# Patient Record
Sex: Female | Born: 1960 | Race: Black or African American | Hispanic: No | Marital: Married | Smoking: Former smoker
Health system: Southern US, Community
[De-identification: ages and names within clinical notes are randomized; demographics above are authoritative.]

## PROBLEM LIST (undated history)

## (undated) DIAGNOSIS — Z86018 Personal history of other benign neoplasm: Secondary | ICD-10-CM

## (undated) DIAGNOSIS — Z860101 Personal history of adenomatous and serrated colon polyps: Secondary | ICD-10-CM

## (undated) DIAGNOSIS — K219 Gastro-esophageal reflux disease without esophagitis: Secondary | ICD-10-CM

## (undated) DIAGNOSIS — D649 Anemia, unspecified: Secondary | ICD-10-CM

## (undated) DIAGNOSIS — J449 Chronic obstructive pulmonary disease, unspecified: Secondary | ICD-10-CM

## (undated) DIAGNOSIS — Z8759 Personal history of other complications of pregnancy, childbirth and the puerperium: Secondary | ICD-10-CM

## (undated) DIAGNOSIS — L309 Dermatitis, unspecified: Secondary | ICD-10-CM

## (undated) DIAGNOSIS — R3915 Urgency of urination: Secondary | ICD-10-CM

## (undated) DIAGNOSIS — Z905 Acquired absence of kidney: Secondary | ICD-10-CM

## (undated) DIAGNOSIS — N200 Calculus of kidney: Secondary | ICD-10-CM

## (undated) DIAGNOSIS — Z87442 Personal history of urinary calculi: Secondary | ICD-10-CM

## (undated) DIAGNOSIS — Z8619 Personal history of other infectious and parasitic diseases: Secondary | ICD-10-CM

## (undated) DIAGNOSIS — N201 Calculus of ureter: Secondary | ICD-10-CM

## (undated) DIAGNOSIS — I509 Heart failure, unspecified: Secondary | ICD-10-CM

## (undated) DIAGNOSIS — Z86010 Personal history of colonic polyps: Secondary | ICD-10-CM

## (undated) DIAGNOSIS — I10 Essential (primary) hypertension: Secondary | ICD-10-CM

## (undated) DIAGNOSIS — E785 Hyperlipidemia, unspecified: Secondary | ICD-10-CM

## (undated) DIAGNOSIS — Z8701 Personal history of pneumonia (recurrent): Secondary | ICD-10-CM

## (undated) DIAGNOSIS — M1990 Unspecified osteoarthritis, unspecified site: Secondary | ICD-10-CM

## (undated) DIAGNOSIS — I219 Acute myocardial infarction, unspecified: Secondary | ICD-10-CM

## (undated) DIAGNOSIS — E119 Type 2 diabetes mellitus without complications: Secondary | ICD-10-CM

## (undated) DIAGNOSIS — Z8742 Personal history of other diseases of the female genital tract: Secondary | ICD-10-CM

## (undated) DIAGNOSIS — Z8719 Personal history of other diseases of the digestive system: Secondary | ICD-10-CM

## (undated) HISTORY — PX: UMBILICAL HERNIA REPAIR: SHX196

## (undated) HISTORY — DX: Essential (primary) hypertension: I10

## (undated) HISTORY — DX: Type 2 diabetes mellitus without complications: E11.9

## (undated) HISTORY — DX: Unspecified osteoarthritis, unspecified site: M19.90

## (undated) HISTORY — DX: Heart failure, unspecified: I50.9

## (undated) HISTORY — DX: Hyperlipidemia, unspecified: E78.5

## (undated) HISTORY — DX: Acute myocardial infarction, unspecified: I21.9

## (undated) HISTORY — DX: Chronic obstructive pulmonary disease, unspecified: J44.9

---

## 1986-07-02 DIAGNOSIS — Z8759 Personal history of other complications of pregnancy, childbirth and the puerperium: Secondary | ICD-10-CM

## 1986-07-02 HISTORY — PX: ECTOPIC PREGNANCY SURGERY: SHX613

## 1986-07-02 HISTORY — DX: Personal history of other complications of pregnancy, childbirth and the puerperium: Z87.59

## 1992-07-02 HISTORY — PX: CHOLECYSTECTOMY: SHX55

## 1998-02-14 ENCOUNTER — Emergency Department (HOSPITAL_COMMUNITY): Admission: EM | Admit: 1998-02-14 | Discharge: 1998-02-14 | Payer: Self-pay | Admitting: Emergency Medicine

## 1998-05-30 ENCOUNTER — Encounter: Payer: Self-pay | Admitting: Orthopedic Surgery

## 1998-05-30 ENCOUNTER — Observation Stay (HOSPITAL_COMMUNITY): Admission: RE | Admit: 1998-05-30 | Discharge: 1998-05-31 | Payer: Self-pay | Admitting: Orthopedic Surgery

## 1998-11-12 ENCOUNTER — Emergency Department (HOSPITAL_COMMUNITY): Admission: EM | Admit: 1998-11-12 | Discharge: 1998-11-12 | Payer: Self-pay | Admitting: Emergency Medicine

## 1999-01-02 ENCOUNTER — Ambulatory Visit (HOSPITAL_COMMUNITY): Admission: RE | Admit: 1999-01-02 | Discharge: 1999-01-02 | Payer: Self-pay | Admitting: *Deleted

## 1999-01-02 ENCOUNTER — Encounter: Payer: Self-pay | Admitting: *Deleted

## 1999-01-31 HISTORY — PX: LUMBAR DISC SURGERY: SHX700

## 1999-02-07 ENCOUNTER — Encounter: Payer: Self-pay | Admitting: Neurosurgery

## 1999-02-07 ENCOUNTER — Observation Stay (HOSPITAL_COMMUNITY): Admission: RE | Admit: 1999-02-07 | Discharge: 1999-02-07 | Payer: Self-pay | Admitting: Neurosurgery

## 1999-05-15 ENCOUNTER — Encounter: Payer: Self-pay | Admitting: Obstetrics and Gynecology

## 1999-05-15 ENCOUNTER — Ambulatory Visit (HOSPITAL_COMMUNITY): Admission: RE | Admit: 1999-05-15 | Discharge: 1999-05-15 | Payer: Self-pay | Admitting: Obstetrics and Gynecology

## 1999-06-08 ENCOUNTER — Encounter: Payer: Self-pay | Admitting: Obstetrics and Gynecology

## 1999-06-08 ENCOUNTER — Ambulatory Visit (HOSPITAL_COMMUNITY): Admission: RE | Admit: 1999-06-08 | Discharge: 1999-06-08 | Payer: Self-pay | Admitting: Obstetrics and Gynecology

## 1999-06-30 ENCOUNTER — Observation Stay (HOSPITAL_COMMUNITY): Admission: AD | Admit: 1999-06-30 | Discharge: 1999-07-02 | Payer: Self-pay | Admitting: Obstetrics and Gynecology

## 1999-07-03 ENCOUNTER — Inpatient Hospital Stay (HOSPITAL_COMMUNITY): Admission: AD | Admit: 1999-07-03 | Discharge: 1999-07-03 | Payer: Self-pay | Admitting: Obstetrics & Gynecology

## 1999-08-08 ENCOUNTER — Encounter: Payer: Self-pay | Admitting: Obstetrics and Gynecology

## 1999-08-08 ENCOUNTER — Inpatient Hospital Stay (HOSPITAL_COMMUNITY): Admission: AD | Admit: 1999-08-08 | Discharge: 1999-08-10 | Payer: Self-pay | Admitting: Obstetrics and Gynecology

## 1999-10-16 ENCOUNTER — Inpatient Hospital Stay (HOSPITAL_COMMUNITY): Admission: AD | Admit: 1999-10-16 | Discharge: 1999-10-18 | Payer: Self-pay | Admitting: Obstetrics & Gynecology

## 1999-10-16 ENCOUNTER — Encounter (INDEPENDENT_AMBULATORY_CARE_PROVIDER_SITE_OTHER): Payer: Self-pay | Admitting: Specialist

## 1999-10-17 HISTORY — PX: TUBAL LIGATION: SHX77

## 2001-10-12 ENCOUNTER — Emergency Department (HOSPITAL_COMMUNITY): Admission: EM | Admit: 2001-10-12 | Discharge: 2001-10-12 | Payer: Self-pay | Admitting: Emergency Medicine

## 2001-10-13 ENCOUNTER — Inpatient Hospital Stay (HOSPITAL_COMMUNITY): Admission: EM | Admit: 2001-10-13 | Discharge: 2001-10-15 | Payer: Self-pay | Admitting: Neurosurgery

## 2001-10-13 ENCOUNTER — Encounter: Payer: Self-pay | Admitting: Emergency Medicine

## 2001-10-14 ENCOUNTER — Encounter: Payer: Self-pay | Admitting: Neurosurgery

## 2001-10-14 HISTORY — PX: OTHER SURGICAL HISTORY: SHX169

## 2003-03-17 ENCOUNTER — Encounter: Admission: RE | Admit: 2003-03-17 | Discharge: 2003-03-17 | Payer: Self-pay | Admitting: Family Medicine

## 2003-03-17 ENCOUNTER — Encounter: Payer: Self-pay | Admitting: Family Medicine

## 2004-11-24 ENCOUNTER — Emergency Department (HOSPITAL_COMMUNITY): Admission: EM | Admit: 2004-11-24 | Discharge: 2004-11-24 | Payer: Self-pay | Admitting: Emergency Medicine

## 2006-08-06 ENCOUNTER — Emergency Department (HOSPITAL_COMMUNITY): Admission: EM | Admit: 2006-08-06 | Discharge: 2006-08-06 | Payer: Self-pay | Admitting: Emergency Medicine

## 2006-11-18 ENCOUNTER — Other Ambulatory Visit: Admission: RE | Admit: 2006-11-18 | Discharge: 2006-11-18 | Payer: Self-pay | Admitting: Family Medicine

## 2007-01-20 ENCOUNTER — Encounter (INDEPENDENT_AMBULATORY_CARE_PROVIDER_SITE_OTHER): Payer: Self-pay | Admitting: Obstetrics and Gynecology

## 2007-01-20 ENCOUNTER — Inpatient Hospital Stay (HOSPITAL_COMMUNITY): Admission: RE | Admit: 2007-01-20 | Discharge: 2007-01-22 | Payer: Self-pay | Admitting: Obstetrics and Gynecology

## 2007-01-20 HISTORY — PX: ABDOMINAL HYSTERECTOMY: SHX81

## 2007-05-06 ENCOUNTER — Emergency Department (HOSPITAL_COMMUNITY): Admission: EM | Admit: 2007-05-06 | Discharge: 2007-05-06 | Payer: Self-pay | Admitting: Emergency Medicine

## 2007-12-03 ENCOUNTER — Encounter: Admission: RE | Admit: 2007-12-03 | Discharge: 2007-12-03 | Payer: Self-pay | Admitting: Family Medicine

## 2008-11-26 ENCOUNTER — Emergency Department (HOSPITAL_COMMUNITY): Admission: EM | Admit: 2008-11-26 | Discharge: 2008-11-26 | Payer: Self-pay | Admitting: Emergency Medicine

## 2010-07-22 ENCOUNTER — Encounter: Payer: Self-pay | Admitting: Family Medicine

## 2010-10-24 ENCOUNTER — Other Ambulatory Visit: Payer: Self-pay | Admitting: Family Medicine

## 2010-10-24 ENCOUNTER — Other Ambulatory Visit (HOSPITAL_COMMUNITY)
Admission: RE | Admit: 2010-10-24 | Discharge: 2010-10-24 | Disposition: A | Discharge status: Home / Self Care | Payer: PRIVATE HEALTH INSURANCE | Source: Ambulatory Visit | Attending: Family Medicine | Admitting: Family Medicine

## 2010-10-24 DIAGNOSIS — Z124 Encounter for screening for malignant neoplasm of cervix: Secondary | ICD-10-CM | POA: Insufficient documentation

## 2010-11-08 ENCOUNTER — Other Ambulatory Visit: Payer: Self-pay | Admitting: Family Medicine

## 2010-11-08 DIAGNOSIS — Z1231 Encounter for screening mammogram for malignant neoplasm of breast: Secondary | ICD-10-CM

## 2010-11-14 NOTE — H&P (Signed)
Lynn Estrada, Lynn Estrada             ACCOUNT NO.:  [id number]   MEDICAL RECORD NO.:  [id number]          PATIENT TYPE:  AMB   LOCATION:  SDC                           FACILITY:  [facility]   PHYSICIAN:  Gerald Leitz, MD          DATE OF BIRTH:  09-08-1960   DATE OF ADMISSION:  DATE OF DISCHARGE:                              HISTORY & PHYSICAL   The patient is scheduled for surgery on January 20, 2007.   HISTORY OF PRESENT ILLNESS:  This is a 50 year old, G2, P1-0-1-1, with  symptomatic uterine fibroids and menorrhagia who desires definitive  therapy via vaginal hysterectomy.  She has been anemic as a result of  menorrhagia with a hemoglobin in May of 8.1.   PAST OBSTETRICAL HISTORY:  1. Spontaneous vaginal delivery x1.  2. SAB x1.   GYNECOLOGICAL HISTORY:  No history of abnormal Pap smears.  Last Pap  smear, Nov 18, 2006, normal.  No history of sexually transmitted  diseases.   PAST MEDICAL HISTORY:  Anemia.   PAST SURGICAL HISTORY:  1. Partial right nephrectomy in 2000.  2. Umbilical hernia repair.   MEDICATIONS:  Amoxicillin.   ALLERGIES:  LATEX.   SOCIAL HISTORY:  The patient is married.  She denies tobacco use.  Occasional alcohol use.  No illicit drug use.   FAMILY HISTORY:  Maternal aunt with breast cancer.  No history of  ovarian or colon cancer.   REVIEW OF SYSTEMS:  Negative except as stated in history of current  illness.   PHYSICAL EXAMINATION:  VITAL SIGNS:  Blood pressure 132/82, weight 194  pounds.  CARDIOVASCULAR:  Regular rate and rhythm.  LUNGS:  Clear to auscultation bilaterally.  ABDOMEN:  Soft, nontender, nondistended.  Positive bowel sounds.  EXTREMITIES:  No clubbing, cyanosis, or edema.  PELVIC:  Normal external female genitalia.  No vulvar, vaginal, or  cervical lesions noted.  Bimanual exam reveals an approximately 12-week  size uterus.  No adnexal masses or tenderness.   Ultrasound on December 25, 2006, shows the uterus measured 10.2-cm in  length, AP  diameter of 7.9-cm, width 8.6-cm.  Endometrial biopsy  performed on December 25, 2006, showed proliferative type endometrium, no  hyperplasia or malignancy.   ASSESSMENT:  1. Symptomatic uterine fibroids.  2. Anemia.  3. Failed hormonal management.   PLAN:  The patient desires definitive therapy via vaginal hysterectomy,  possible bilateral salpingo-oophorectomy.  Risks, benefits, alternatives  of surgery were discussed with the patient including, but not limited to  infection; bleeding; damage to bowel, bladder, surrounding organs with  the need for further surgery; risk of transfusion; HIV; hepatitis B and  C were discussed.  The patient understands all risks and desires to  proceed with a vaginal hysterectomy.      Gerald Leitz, MD  Electronically Signed     TC/MEDQ  D:  01/19/2007  T:  01/19/2007  Job:  [id number]

## 2010-11-14 NOTE — Op Note (Signed)
Lynn Estrada, Lynn Estrada             ACCOUNT NO.:  [id number]   MEDICAL RECORD NO.:  [id number]          PATIENT TYPE:  INP   LOCATION:  9306                          [facility]ACILITY:  [facility]   PHYSICIAN:  Gerald Leitz, MD          DATE OF BIRTH:  [birth year]   DATE OF PROCEDURE:  01/20/2007  DATE OF DISCHARGE:                               OPERATIVE REPORT   PREOPERATIVE DIAGNOSES:  1. Symptomatic uterine fibroids.  2. Menorrhagia.  3. Anemia left ovarian cyst.  4. Right gluteal mass.   POSTOPERATIVE DIAGNOSES:  1. Symptomatic uterine fibroids.  2. Menorrhagia.  3. Anemia left ovarian cyst.  4. Right gluteal mass.  5. Suspected left lymphocyst.   PROCEDURE:  Vaginal hysterectomy with conversion to abdominal  hysterectomy, left salpingo-oophorectomy, with lysis of adhesions and  removal of right gluteal cyst.   SURGEON:  Gerald Leitz, MD   ASSISTANT:  Bing Neighbors. Delcambre, MD   ANESTHESIA:  General.   SPECIMENS:  Uterus, left fallopian tube and ovary, and right gluteal  mass.   DISPOSITION:  Specimen sent to pathology.   ESTIMATED BLOOD LOSS:  150 mL.   FLUIDS:  3100 mL lactated Ringer's.   URINE OUTPUT:  150 mL.   FINDINGS:  Approximately a 14-week fibroid uterus with multiple  leiomyomata.  There was a complex cyst on the left ovary.  The patient  also had adhesions of the colon to the fundus of the uterus.  The right  ovary and fallopian tube appeared normal.   COMPLICATIONS:  None.   PROCEDURE:  The patient was taken to the operating room, where she was  placed in the dorsal lithotomy position.  She was prepped and draped in  the usual sterile fashion.  A weighted speculum was placed into the  vaginal vault.  The cervix was injected circumferentially with 1%  lidocaine with epinephrine approximately 10 mL.  The cervix was incised  circumferentially with a scalpel.  The bladder flap reflection was  identified and entered sharply with Metzenbaum scissors.  Attention was  turned to the posterior aspect of the cervix.  This was incised and the  peritoneum was identified and entered sharply.  A small weighted  speculum was placed.  The uterosacral ligaments were clamped,  transected, and suture ligated with 0 Vicryl.  This was repeated on the  left uterosacral.  The cardinal ligaments were clamped with Heaney  clamps, transected, and suture ligated with 0 Vicryl.  The patient was  noted to have adhesions of the colon to the anterior aspect of the  fundus due to the dense adhesions.  A decision was made to go anteriorly  to complete the surgery to avoid bowel injury.  Surgeons' gloves and  gowns were changed.  Attention was then turned to the abdomen, which was  reprepped.  A Pfannenstiel skin incision was made with a scalpel,  carried down to the underlying layer of fascia.  The fascia was incised  in the midline.  The incision was extended laterally with Mayo scissors.  The superior aspect of the fascial incision was grasped with  Kocher  clamps, elevated, and underlying rectus muscles were dissected off with  Mayo scissors.  This was repeated on the inferior aspect of the fascial  incision.  The peritoneum was identified, entered sharply with  Metzenbaum scissors.  The abdomen was examined with the findings as  noted above.  Balfour retractor was placed into the abdomen and the  bowel was packed away with moist laparotomy sponges.  The adhesions of  the colon to the fundus of the uterus were excised with Metzenbaum  scissors.  The round ligament on the right was suture ligated with 0  Vicryl.  It was then transected and the bladder reflection was created  toward the midline bilaterally.  The utero-ovarian ligament on the right  was doubly clamped, transected, and suture ligated with 0 Vicryl.  The  infundibulopelvic ligament on the left was clamped with Heaney clamp,  transected and suture ligated with 0 Vicryl.  At this point the uterus,  cervix and left  tube and ovary were completely excised and sent to  pathology.  An angle suture was placed in the vaginal cuff bilaterally  with 0Vicryl and a running stitch of zero Vicryl was used to close the  vaginal cuff.  The abdomen was copiously irrigated with warm normal  saline.  Everything was found to be hemostatic.  Attention was turned to  a mass in the left abdominal sidewall that appeared to be  retroperitoneal.  The retroperitoneum was opened with Metzenbaum  scissors.  This appeared to be a benign lymphocyst approximately 3 cm in  size.  All instruments and sponges were removed from the abdomen.  The  fascia was closed with 0 PDS.  The skin was closed with staples.  Sponge, lap and needle counts were correct x2.  Ancef 2 g was given  prior to the procedure.  The patient was awakened from anesthesia and  taken to the recovery room awake and in stable condition.      Gerald Leitz, MD  Electronically Signed     TC/MEDQ  D:  01/20/2007  T:  01/20/2007  Job:  [id number]

## 2010-11-14 NOTE — Op Note (Signed)
NAME:  Lynn Estrada, Lynn Estrada             ACCOUNT NO.:  [id number]   MEDICAL RECORD NO.:  [id number]          PATIENT TYPE:  INP   LOCATION:  9306                          [facility]ILITY[facility]  [facility]   PHYSICIAN:  Gerald Leitz, MD          DATE OF BIRTH:  Mar 13, 1961   DATE OF PROCEDURE:  DATE OF DISCHARGE:                               OPERATIVE REPORT   ADDENDUM:  At the end of the report, please write:  Once abdominal hysterectomy was  complete and skin was closed with staples, attention was turned to the  right buttock, which was prepped and draped in a sterile fashion.  Approximately 10 mL of lidocaine with epinephrine were injected around  an approximately 2-cm gluteal mass.  A 2-cm skin incision was made with  a scalpel.  The gluteal mass, which appeared to be a lipoma, was excised  using sharp dissection with Metzenbaum scissors.  Gluteal tissue was  reapproximated with 2-0 Vicryl.  The skin was closed with 3-0 Vicryl.  Excellent hemostasis was noted.  The patient was then awakened from  anesthesia and taken to the recovery room awake and in stable condition.      Gerald Leitz, MD  Electronically Signed     TC/MEDQ  D:  01/20/2007  T:  01/20/2007  Job:  [id number]

## 2010-11-15 ENCOUNTER — Ambulatory Visit
Admission: RE | Admit: 2010-11-15 | Discharge: 2010-11-15 | Disposition: A | Discharge status: Home / Self Care | Payer: PRIVATE HEALTH INSURANCE | Source: Ambulatory Visit | Attending: Family Medicine | Admitting: Family Medicine

## 2010-11-15 DIAGNOSIS — Z1231 Encounter for screening mammogram for malignant neoplasm of breast: Secondary | ICD-10-CM

## 2010-11-17 ENCOUNTER — Other Ambulatory Visit: Payer: Self-pay | Admitting: Family Medicine

## 2010-11-17 DIAGNOSIS — R928 Other abnormal and inconclusive findings on diagnostic imaging of breast: Secondary | ICD-10-CM

## 2010-11-17 NOTE — Op Note (Signed)
[facility]  Patient:    Lynn Estrada, Lynn Estrada Visit Number: [id number] MRN: [id number]          Service Type: EMS Location: ED Attending Physician:  Shelba Flake Dictated by:   Julio Sicks, M.D. Proc. 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  PROCEDURE:  Reexploration of right L5-S1 laminotomy with microdiskectomy.  SURGEON:  Julio Sicks, M.D.  ASSISTANT:  Donzetta Sprung. Roney Jaffe., M.D.  INDICATIONS:  [name] is a 50 year old female who is status post a previous right-sided L5-S1 laminotomy and microdiskectomy approximately four years ago. The patient did well postoperatively.  Approximately two weeks ago, the patient began having the abrupt onset of severe right lower extremity pain. These symptoms progressively worsened and failed conservative management including oral steroids.  The patient has reported to the emergency room on two different occasions with severe intractable pain.  Yesterday, the patients pain was unable to be managed by parenteral pain medication and decision was made to proceed urgently with an MRI scan of her lumbar spine to better evaluate what is going on structurally.  MRI scanning of her lumbar spine demonstrated a very large right-sided L5-S1 recurrent disk herniation with compression of the right-sided S1 nerve root.  I counseled the patient as to her options.  I decided to proceed with a right-sided L5-S1 laminotomy and microdiskectomy in hopes of relieving her symptoms.  The patient is aware of the risks and benefits including but not limited to risks of anesthesia, bleeding, infection, CSF leak, nerve root  injury, disk herniation.  The patient has been given the opportunity to ask questions and appears to understand.  She wishes to proceed with surgery.  DESCRIPTION OF PROCEDURE:  The patient was taken to the operating room and placed on the table in supine position.  Under adequate level of anesthesia, the patient was positioned prone on Wilson frame and appropriately padded. The patients lumbar region was prepped and draped sterilely.  A linear skin incision was made overlying the L5-S1 interspace.  This was carried down sharply in the midline.  A subperiosteal dissection was performed on the right which was the lamina of the facet joints of L5 and S1.  Deep retaining retractor was placed.  The previous laminotomy site was then dissected free using dental instruments and Kerrison rongeurs.  The laminotomy was slightly widened using Kerrison rongeurs.  The underlying thecal sac and disk herniation were apparent.  The microscope was brought into the field to do microdissection of the right-sided S1 nerve root and disk herniation.  Fibrous tissue overlying the disk herniation was dissected free using microinstruments. A very large free fragment of disk rupture was then encountered and resected using blunt nerve hooks and pituitary rongeurs.  The pedicle of S1 was identified as was the right-sided S1 nerve root.  All elements of the disk fragment were completely removed.  Epidural scar was removed.  The thecal sac and S1 nerve root were then mobilized and retracted toward the midline.  The disk space was isolated and incised with 15 blade in rectangular fashion.  A wide disk space clamp was then achieved  using pituitary rongeurs and Epstein curets.  All loose disk material was removed from the interspace.  All elements of the disk herniation had been completely removed.  There was no evidence of injury to thecal sac or nerve roots.  The wound was then copiously irrigated with antibiotic  solution.  Gelfoam was placed postoperatively for hemostasis and found to be good.  The microscope and retractors were removed.  Hemostasis in the muscle was achieved with electrocautery.  The wound was then closed in layers with Vicryl sutures. Steri-Strips and sterile dressing were applied.  There were no apparent complications.  The patient tolerated the procedure well and she returns to the recovery room postoperatively. Dictated by:   Julio Sicks, M.D. Attending Physician:  Shelba Flake DD:  10/14/01 TD:  10/14/01 Job: [id number]

## 2010-11-17 NOTE — Discharge Summary (Signed)
[facility]  Patient:    Lynn Estrada, Lynn Estrada                    MRN: [id number] Adm. Date:  [id number] Disc. Date: [id number] Attending:  Shaune Spittle Dictator:   Miguel Dibble, C.N.M.                           Discharge Summary  ADMISSION DIAGNOSES:          1. Intrauterine pregnancy at 28-3/7 weeks.                               2. Pyelonephritis.  DISCHARGE DIAGNOSES:          1. Intrauterine pregnancy at 28-6/7 weeks.                               2. Pyelonephritis, recovering.  PROCEDURE:                    IV antibiotics.  Continuous electronic fetal monitoring.  HOSPITAL COURSE:              On February 5, at approximately 11 a.m., [name] with a history of nausea and vomiting and fever and a temperature of 101, was admitted with back pain and suprapubic pain.  She was initiated on IV therapy of Phenergan for antiemetics.  Her hemoglobin was 9.4, hematocrit 27.6, white count 8.3, platelets 327, sodium 138, potassium 3.2, glucose 88, BUN 6, creatinine 0.9, SGOT 16, SGPT 14, alkaline phosphatase 9.6.  Her urine was significant for greater than 80 mg per dl of ketones, positive nitrites, moderate heme, and a large amount of white blood cells with many bacteria.  She was initiated on Ancef IV and was  admitted.  On her second hospital day, February 6, she felt much better, was afebrile.  Vital signs were satisfactory.  Fetal heart rate was reactive and reassuring for 28+ weeks.  She was continued on antibiotics.  On hospital day #3, on February 7, her urine C&S returned positive for K pneumonia.  She was continued on observation.  She was continued on Ancef.  She had one temperature spike to 101.1.  On hospital day #4, February 8, she had been afebrile for greater than 4 hours and her intake and output were satisfactory.  She denied any CVA tenderness or suprapubic pain.  She had no contractions.  She was  discharged home in stable condition on Bactrim p.o. to return to the office in one week.  DISCHARGE INSTRUCTIONS:       Pyelonephritis and UTI as well as antepartum precautions.  DISCHARGE MEDICATIONS:        Bactrim double strength one twice daily for a                               week.  FOLLOW-UP:                    In one week in the office. DD:  08/10/99 TD:  08/12/99 Job: [id number]

## 2010-11-17 NOTE — Discharge Summary (Signed)
NAMESHANECE, COCHRANE             ACCOUNT NO.:  [id number]   MEDICAL RECORD NO.:  [id number]          PATIENT TYPE:  INP   LOCATION:  9306                          [facility]ILITY:  [facility]   PHYSICIAN:  Gerald Leitz, MD          DATE OF BIRTH:  11-12-1960   DATE OF ADMISSION:  01/20/2007  DATE OF DISCHARGE:  01/22/2007                               DISCHARGE SUMMARY   INDICATIONS:  1. Menorrhagia.  2. Uterine fibroids.   BRIEF HOSPITAL COURSE:  The patient underwent total abdominal  hysterectomy and removal of the left fallopian tube and ovaries.  She  also had removal of a right gluteal cyst.  She did well postoperatively,  received routine postoperative care.  Hemoglobin on postop day #1 was  7.6 and hematocrit was 23.3.  She was noted to have hematuria following  the surgery.  However, this resolved spontaneously.  She is discharged  home on postop day #2 with return of bowel and bladder function.  She  will follow up with Dr. Richardson Dopp on January 24, 2007, for staple removal.  Postop visit in 4-6 weeks.   Discharge diagnosis:  1 menorrhagia  2 uterine fibroids  3 s/o TAH/LSO   Medications at discharge:  Motrin and Percocet      Gerald Leitz, MD  Electronically Signed     TC/MEDQ  D:  02/18/2007  T:  02/18/2007  Job:  [id number]

## 2010-11-17 NOTE — H&P (Signed)
[facility]  Patient:    Lynn Estrada                     MRN: [id number] Adm. Date:  [id number] Attending:  Shaune Spittle Dictator:   Nigel Bridgeman, C.N.M.                         History and Physical  HISTORY OF PRESENT ILLNESS:       Ms. Maulding is a 50 year old gravida 3, para 0-0-2-0 at 28-3/7 weeks who presented to maternity admission unit with a five-day history of nausea, vomiting, and fever.  Her temperature yesterday was 101. She has been taking Phenergan and Tylenol over the last several days but has been unable to keep food and fluids down for the last 24 hours.  She reports low back pain and dysuria.  She denies any uterine contractions, leaking, or bleeding. he reports positive fetal movement.  While in maternity admissions, the patient receiv3ed two bags of IV fluids.  Her ketones were greater than 80 on initial dip stick.  She still continued with sporadic vomiting.  Her temperature ______ on second evaluation was 100.8.  Her UA was indicative of UTI.  Therefore, she is ow to be admitted for 23-hour observation.                                    Her pregnancy has been remarkable for 1) Chlamydia in October 2000.  2) Back surgery in August 2000 with a ruptured disk x 2.  3) Advanced maternal age with amnion decline.  4) History of partial right nephrectomy.  5) History of cholecystectomy.  6) History of ectopic.  7) History of pyelonephritis in December 2000.  8) History of hyperemesis.  PRENATAL LABORATORY DATA:         Blood type is Rh positive.  Her CBC today showed a hemoglobin of 9.4, hematocrit 27.6, white blood cell count 8.3, and platelet count 327.  Comprehensive metabolic was within normal limits.  Potassium was 3.2. Serum amylase was 68.  Clean catch urine showed a specific gravity of 1015, moderate blood, ketones greater than 80, protein 30, positive nitrites, large leukocyte esterase, few epithelial  cells, white blood cells too numerous to count, many bacteria.  HISTORY OF PRESENT PREGNANCY:     The patient entered care at approximately 10 weeks.  She was seen at that time for spotting and wiping.  She was treated or bacterial vaginosis.  She also had positive chlamydia noted at that visit for which she was treated.  She had a normal AFP but declined amniocentesis.  She had a positive UA and culture in December for which she was treated with Keflex.  She was treated for pyelonephritis soon after that with Rocephin for 24 hours x 2 doses, then Keflex p.o. for 10 days.  She had no further bleeding.  She had declined amniocentesis.  She was also treated earlier in her pregnancy at approximately 5 weeks for a UTI.  OBSTETRICAL HISTORY:              In 1982, she had a spontaneous miscarriage at  [redacted] weeks gestation.  In 1988, she had a 16-week left ectopic pregnancy which was surgically removed.  PAST MEDICAL HISTORY:              She was on  birth control pills approximately 10 years ago.  She had a yeast infection approximately one year ago.  She reports he usual childhood illnesses.  She had half of her right kidney and gallbladder removed related to stones in 1994.  She had back surgery in August 2000 secondary to ruptured disk.  She had umbilical hernia repair at age 53.  ALLERGIES:  No known medication allergies.  FAMILY HISTORY:                   The patients niece has juvenile diabetes. The patients aunt had some type of cancer.  Her genetic history is remarkable for patients niece with sickle cell trait.   SOCIAL HISTORY:                   The patient is a previous smoker and stopped approximately eight months ago.  The patient is married to the father of the baby. He is not currently present with her.  His name is Richard Worrell.  The patient is African-American of the [facility] faith.  She has three years of college and is employed.  her husband has high school  education, and he is also employed.  The  patient has been followed by the [facility].  She denies any alcohol, drug, or tobacco use during this pregnancy.  PHYSICAL EXAMINATION:  VITAL SIGNS:                      Temperature 100.8, pulse 103, respirations 20, blood pressure 111/61.  HEENT:                            Within normal limits, although the patient has a nonproductive, hacking, gagging cough.  LUNGS:                            Bilateral breath sounds are clear.  HEART:                            Regular rhythm without murmur.  BREASTS:                          Soft and nontender.  ABDOMEN:                          Fundal height is approximately 28 cm size. Abdomen is soft and nontender.  There is no suprapubic tenderness.  BACK:                             There is no CVA tenderness.  PELVIC:                           Exam deferred.  LABORATORY DATA:                  Electronic fetal monitor reveals reactive fetal heart rate with no decelerations.  There are no uterine contractions noted.  EXTREMITIES:                      Deep tendon reflexes are within normal limits. There is no edema noted.  IMPRESSION:  1. Intrauterine pregnancy at 28-3/7 weeks.                                   2. Urinary tract infection, questionable viral                                      syndrome.                                   3. Current fetal tachycardia.  PLAN:                             1. Admit to [facility] for 23-hour                                      observation per Dr. Dierdre Forth as                                      attending physician.                                   2. Continue IV hydration with LR at 200 cc/hour.                                   3. Ancef 1 g IV q.8h.                                   4. Tylenol 650 mg suppository per rectum q.4h.                                      p.r.n.  temperature greater than 100.4.                                   5. Will offer ice chips.  If patient is able o                                      tolerate them, may advance to clear liquids                                       when able.                                   6. Continuous electronic fetal monitoring.  7. Medical doctor will follow. DD:  08/07/99 TD:  08/07/99 Job: [id number]

## 2010-11-17 NOTE — H&P (Signed)
[facility]  Patient:    Lynn Estrada                     MRN: [id number] Adm. Date:  [id number] Attending:  Leonard Schwartz Dictator:   Nigel Bridgeman, C.N.M.                         History and Physical  HISTORY OF PRESENT ILLNESS:   Lynn Estrada is a 50 year old, gravida 3, para 0-0-2-0 at 22-5/7 weeks, who presents with nausea, vomiting, fever, and back pain x 2 days. She had previous received treatment for a UTI with Septra over the last two weeks but culture results obtained today at [facility] showed resistance to Septra.  The office was calling a prescription in for Keflex for the patient when she called with these symptoms.  She denies any vaginal bleeding, leaking of fluid, abdominal cramping, or significant dysuria.  She also denies any significant recent viral exposure.  PRENATAL COURSE:              Her pregnancy has remarkable for:                               1. History of two SABs with one of those                                  being an ectopic pregnancy at 16 weeks.                               2. 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                               3. Cholecystectomy at the same time secondary to                                  gallstones.                               4. Advanced maternal age.                               5. History of back surgery in August of 2000                                  secondary to a ruptured disk.                               6. First trimester bleeding.                               7. History of hyperemesis.  PRENATAL LABORATORY DATA:     The patient is Rh positive.  CBC obtained today is pending.  Clean-catch UA shows specific gravity of 1.015, moderate blood, positive  nitrites, large leukocyte esterase.  Comprehensive metabolic panel is all within normal limits.  HISTORY OF PRESENT PREGNANCY:                     The patient entered care at approximately 10 weeks. She was seen for first trimester spotting at that time.  GC and Chlamydia cultures were done which showed Chlamydia positive.  She was treated at that time and had a negative test of cure.  She also was treated for BV in October.  She has had some nausea and vomiting of pregnancy and has been treated with Phenergan. She was initiated treatment for a UTI approximately two weeks ago with Septra.  PAST OBSTETRICAL HISTORY:     In 1982 she had a spontaneous miscarriage at 11 weeks. In 1988 she had a 16-week ectopic pregnancy on the left hand side. She also had hyperemesis with both of those pregnancies.  PAST MEDICAL HISTORY:         She was on birth control pills approximately 10 years ago.  She has occasional yeast infections.  She reports the usual childhood illnesses.  She had half of her right kidney removed and her gallbladder removed in 1994 secondary to the repetitive occurrence of stones in both organs. She is a previous smoker, but stopped approximately six months prior to pregnancy. She had back surgery in August of 2000 secondary to a ruptured disk.  She had a  lumbar diskectomy.  She was seen by Dr. Dutch Quint in [place].  She also had an umbilical hernia repair at age 54.  ALLERGIES:                    She has no known medication allergies.  FAMILY HISTORY:               The patients niece has juvenile diabetes.  Her aunt had some type of cancer.  Her niece has sickle cell trait.  GENETIC HISTORY:              Unremarkable.  SOCIAL HISTORY:               The patient is married to the father of the baby. He is involved and supportive.  His name is Richard Vultaggio.  The patient is African-American and of the [facility] faith.  She has been followed by the Physician Service, [facility].  She denies any alcohol, drug or tobacco use during this pregnancy.  She has three years of college.  Her husband is high  school educated and is employed as a Financial risk analyst.  The patient is employed in a Audiological scientist.  PHYSICAL EXAMINATION:  VITAL SIGNS:                  Temperature is 102.6, pulse is 106, blood pressure 115/66,  HEENT:                        Within normal limits with clear drums and throat passages.  HEART:                        Regular rate and rhythm without murmur.  BREASTS:                      Soft and nontender.  ABDOMEN:  Fundal height is approximately 23 cm.  Fetal heart rate is in the 170s with no decelerations.  There are no uterine contractions noted.  Abdomen is soft and nontender.  BACK:                         Slight left CVA tenderness and moderate deep lower back pain which the patient describes as being consistent with her previous back surgery.  PELVIC:                       Examination deferred.  EXTREMITIES:                  Deep tendon reflexes are 2+ without clonus. There is trace edema noted.  IMPRESSION:                   1. Intrauterine pregnancy at 22-5/7 weeks.                               2. Pyelonephritis.  PLAN:                         1. Admit to the third floor for 23-hour observation                                  per consult with Dr. Marline Backbone who is                                  attending physician.                               2. Ancef 1 g IV q.6h.                               3. Tylenol suppository 650 mg one per rectum now.  4. IV hydration and Phenergan p.r.n.                               5. Dr. Stefano Gaul will anticipate possible discharge                                  tomorrow if fever and nausea and vomiting are                                  under control with dosing of p.o. antibiotics, if                                  nausea and vomiting resolve.                               6. MDs will follow. DD:  06/30/99 TD:  06/30/99 Job: [id number]

## 2010-11-17 NOTE — H&P (Signed)
[facility]  Patient:    Lynn Estrada, Lynn Estrada                    MRN: [id number] Adm. Date:  [id number] Disc. Date: [id number] Attending:  Shaune Spittle Dictator:   Wynelle Bourgeois, C.N.M.                         History and Physical  HISTORY OF PRESENT ILLNESS:   Ms. Linson is a 50 year old black female, G3, P0-0-2-0, at 7 weeks who presented to the office today complaining of a gush of clear fluid from the vagina at 0700 hours this morning.  She also reports uterine contractions every 10-15 minutes.  She reports positive fetal movement and denies bleeding.  She was examined in the office and a sterile speculum examination revealed positive pooling, positive ferning and a pH of 5.5.  Her cervical examination was 275 and 0 station with a vertex presentation and she is sent to the hospital for admission today.  PRENATAL COURSE:              She has been followed by the MD Service at [facility] since nine weeks and her pregnancy has been remarkable for a history of SAB x 1, history of a left ectopic pregnancy x 1, first trimester bleeding, history of ruptured lumbar disk x 2 with diskectomy, a history of Chlamydia, treated in October of 2000, history of right partial nephrectomy, history of cholecystectomy, advanced maternal age (declined amine), history of pyelonephritis, and recurrent urinary tract infections.  PRENATAL LABORATORY DATA:     Hemoglobin 10.6, hematocrit 30.6, platelets 260, blood type B positive.  Antibody screen negative.  Sickle cell trait negative. RPR nonreactive.  Rubella equivocal.  HBsAg negative.  HIV nonreactive.  Urinalysis  100,000 multiple species and Klebsiella.  Pap test, inflammation.  Gonorrhea negative.  First trimester Chlamydia positive.  Test of cure, Chlamydia negative. AFP free beta within normal limits.  Glucola within normal limits.  Group B strep negative.  OBSTETRICAL HISTORY:          Remarkable  for spontaneous abortion at [redacted] weeks gestation in 1982, a left ectopic pregnancy at 16 weeks in 1988 and a the present pregnancy.  PAST MEDICAL HISTORY:         Remarkable for partial right nephrectomy and cholecystectomy related to stones in 1994.  History of previous smoking, quit six months ago.  Lumbar laminectomy February 07, 1999, for a ruptured lumbar disk and repair of umbilical hernia at age 43.  FAMILY HISTORY:               Remarkable for a niece with juvenile diabetes. An aunt with unknown type of cancer and a niece with sickle cell trait.  GENETIC HISTORY:              Remarkable for a niece with sickle cell trait.  SOCIAL HISTORY:               The patient lives with her husband named Richard.  She is of the [employer] and works as a Merchandiser, retail at [employer], [employer]. She quit smoking six months ago.  Denies alcohol or illicit drug use.   PHYSICAL EXAMINATION:  VITAL SIGNS:                  Afebrile.  Vital signs stable.  HEENT:  Within normal limits.  LUNGS:                        Clear to auscultation bilaterally.  HEART:                        Regular rate and rhythm.  No murmurs.  ABDOMEN:                      Gravid at 38 cm with an EFW of approximately 7 pounds.  Fetal heart rate auscultated in the 140s.  Uterine contractions approximately every 10-15 minutes.  PELVIC:                       Cervical examination, 2 cm, 75%, 0 station and vertex.  Positive pooling of clear amniotic fluid.  EXTREMITIES:                  Within normal limits with trace edema.  ASSESSMENT:                   1. Intrauterine pregnancy, at 38 weeks.                               2. Group B streptococcus negative.                               3. Spontaneous rupture of membranes, clear                                  fluid.                               4. Early labor.                               5. History of lumbar laminectomy.                                6. History of partial right nephrectomy.                               7. Recurrent urinary tract infections.                               8. Rubella equivocal.  PLAN:                         1. Admit to birthing suite under MD Service with                                  Dr. Cleatrice Burke as attending (Dr. Stefano Gaul                                  as primary).  2. Routine MD orders.                               3. Anticipate spontaneous vaginal delivery.                                 2.  x DD:  10/16/99 TD:  10/16/99 Job: [id number]

## 2010-11-17 NOTE — Discharge Summary (Signed)
[facility]  Patient:    Lynn Estrada                     MRN: [id number] Adm. Date:  [id number] Disc. Date: 07/02/99 Attending:  Cleatrice Burke Dictator:   Vance Gather Duplantis, C.N.M.                           Discharge Summary  ADMISSION DIAGNOSES:          1. Intrauterine pregnancy at 22 and five-sevenths                                  weeks.                               2. Pyelonephritis.  DISCHARGE DIAGNOSES:          1. Intrauterine pregnancy at 22 and five-sevenths                                  weeks.                               2. Resolving pyelonephritis.  PROCEDURES THIS ADMISSION:    None.  HOSPITAL COURSE:              Ms. Turko is a 50 year old gravida 3 para 0-0-2-0 at 42 and five-sevenths weeks, who presented complaining of nausea, vomiting, and fever, back pain, and headache, x 2 days.  She was recently treated for a UTI with Septra, but the culture results showed resistance to that medication at that time. She was to start on Keflex today, when she called in with these symptoms.  Her other history complications are a partial right nephrectomy in 1994, cholecystectomy in 1994 secondary to gallstones, advanced maternal age, history of back surgery in August 2000 secondary to a ruptured disk, first trimester bleeding, and a history of hyperemesis with this pregnancy.  She was started on IV antibiotics on admission, June 30, 1999, with Ancef 1 g IV q.6h. and was also given Phenergan p.r.n.  On day #1 of hospital stay, she was just feeling slightly better, though was still febrile.  On day #2 of hospital stay, she is afebrile or greater than 24 hours now, and is tolerating a regular diet and feeling much better, and is deemed ready for discharge today.  DISCHARGE INSTRUCTIONS:       Patient is to call for fever greater than 100.4 or for persistent nausea/vomiting or returning of symptoms of back pain,  etc.  DISCHARGE FOLLOW-UP:          In the office on Thursday or as needed.  DISCHARGE MEDICATIONS:        She is to get Rocephin 1 g IM today with a repeat in 24 hours in Maternity Admissions, and Keflex 500 mg p.o. b.i.d. for ten days. he was also given a prescription for Phenergan 25 mg p.o. q.6h. p.r.n. for nausea nd vomiting, #12 with no refills.  DISCHARGE LABORATORY:         Her hemoglobin is 13.8, her WBC count is 17.6, and her platelets are at 212. DD:  07/02/99 TD:  07/03/99 Job: [id number]  [id number]

## 2010-11-17 NOTE — Discharge Summary (Signed)
[facility]  Patient:    Lynn Estrada, Lynn Estrada                    MRN: [id number] Adm. Date:  [id number] Disc. Date: 10/18/99 Attending:  Cleatrice Burke                           Discharge Summary  ADMISSION DIAGNOSES:          1. Intrauterine pregnancy at 38 weeks.                               2. Group B strep negative.                               3. Spontaneous rupture of membranes.                               4. Early labor.                               5. History of lumbar laminectomy.                               6. History of partial right nephrectomy.                               7. Recurrent urinary tract infections.                               8. Rubella equivocal.  DISCHARGE DIAGNOSES:          1. Intrauterine pregnancy at 38 weeks.                               2. Group B strep negative.                               3. Spontaneous rupture of membranes.                               4. Early labor.                               5. History of lumbar laminectomy.                               6. History of partial right nephrectomy.                               7. Recurrent urinary tract infections.                               8. Rubella equivocal.  9. Status post spontaneous vaginal delivery of a                                  female infant named Miracle Monet, Apgars 9 and 9,                                  weight 6 pounds, 1 ounce, first-degree perineal                                  laceration with repair and elective postpartum                                  tubal ligation.  PROCEDURES:                   1. Spontaneous vaginal delivery.                               2. Repair of laceration.                               3. Postpartum bilateral tubal ligation.                               4. Lysis of adhesions.  HOSPITAL COURSE:              The patient is a 50 year old G3,  P0-0-2-0, who presented with SROM of clear fluid at 0700 on October 16, 1999, with contractions  every 10-15 minutes.  No vaginal bleeding and positive fetal movement.  The cervix on admission was 2 cm, 75% effaced, and 0 station, with a vertex presentation. She was admitted to the service of the physicians at [facility] with Dr. Cleatrice Burke as attending M.D., and was monitored routinely throughout labor.  Spontaneous vaginal delivery occurred at 1853 hours on October 16, 1999, of a viable female infant, Apgars of 9 and 9, weight 6 pounds, 1 ounce, with a first-degree perineal laceration which was repaired in the usual fashion.  The following morning, on October 17, 1999, the patient underwent a postpartum elective bilateral tubal ligation and lysis of adhesions by Dr. Stefano Gaul, under epidural anesthesia. Findings included an edematous right tube and questionable left tube and adhesions. EBL was 10 cc.  Postpartum recovery followed the normal fashion with healing perineum, scant lochia, and stable vital signs.  On the second day postpartum and first day postoperative, the patient was afebrile, with vital signs stable. Heart rate regular rate and rhythm with no murmur.  Respirations unlabored.  Clear to  auscultation.  Abdomen soft, appropriately tender, incision with dressing clean and dry.  Lochia was small, and perineum was healing.  The patient was, therefore, deemed to be ready for discharge and was discharged home on October 18, 1999.  DISCHARGE LABORATORY DATA:    WBC 10.0, hemoglobin 9.1, hematocrit 26.5, platelet count 215, RPR nonreactive.  DISCHARGE INSTRUCTIONS:       Per [facility] handout.  DISCHARGE MEDICATIONS:        1. Tylox 1-2 p.o. q.3-4h. p.r.n.  2. Motrin 600 mg 1 p.o. q.6h. p.r.n. cramping.                               3. Iron 1 tablet q.d.  FOLLOW-UP:                    Will be in six weeks at [facility] or p.r.n. as indicated. DD:   10/18/99 TD:  10/18/99 Job: [id number]

## 2010-11-17 NOTE — Op Note (Signed)
[facility]  Patient:    Lynn Estrada, Lynn Estrada                    MRN: [id number] Proc. Date: 10/17/99 Adm. Date:  [id number] Disc. Date: [id number] Attending:  Cleatrice Burke                           Operative Report  PREOPERATIVE DIAGNOSIS:       Postpartum day #1.  Desires sterilization.  Prior  surgery for left ectopic pregnancy.  POSTOPERATIVE DIAGNOSIS:      Postpartum day #1.  Desires sterilization.  Prior  surgery for left ectopic pregnancy.  Pelvic adhesions.  OPERATION:                    Bilateral tubal ligation and lysis of adhesions.  SURGEON:                      Janine Limbo, M.D.  ASSISTANT:  ANESTHESIA:                   Epidural.  ESTIMATED BLOOD LOSS:  INDICATIONS:                  [name] had a vaginal delivery of a healthy female infant on October 16, 1999.  She desires permanent sterilization.  She understands the indications for her procedure and she accepts the risks of, but not limited to, anesthetic complications, bleeding, infections, and possible damage to the surrounding organs.  She understands that there is a small, but real failure rate associated with this procedure (10 to 12 per 1000).  FINDINGS:                     The right fallopian tube was edematous, but appeared normal.  There were filmy ahdesions between the right fallopian tubes and the right ovary.  It was difficult to identify the left fallopian tube because of adhesions which included omentum.  DESCRIPTION OF PROCEDURE:     The patient was taken to the operating room where it was determined that the epidural she had received for her labor would be adequate for her tubal ligation.  The patients abdomen was prepped with multiple layers f Betadine and then sterilely draped.  The subumbilical area was injected with 0.25% Marcaine.  The patient has had a prior umbilical hernia surgery.  An incision was made and carried sharply  through the subcutaneous tissue, the fascia, and the anterior peritoneum.  The right fallopian tube was identified and followed to its fimbriated end.  A knuckle of tube was made on the right using a free tie and then a tied suture ligature of 0 plain catgut.  The patient was given antibiotics at  this time.  We then had difficulty identifying for sure the left fallopian tube. There were adhesions on the left that were lysed using a combination of sharp and blunt dissection.  There was tissue on the left that may have been the distal end of the left fallopian tube and this was isolated using Kelly clamps.  The area as sharply excised.  There were two firm, pale, tan lesions measuring 0.3 and 0.2 m on the left and these were removed and sent to pathology as well.  Hemostasis was noted to be adequate.  The anterior peritoneum and the fascia were then closed using a running suture of  2-0 Vicryl.  The skin was reapproximated using a subcuticular suture of 4-0 Vicryl.  Sponge, needle, and instrument counts were correct x 2 occasions.  The estimated blood loss was less than 10 cc. The patient tolerated her procedure well.  She was taken to the recovery room in stable condition. DD:  10/18/99 TD:  10/18/99 Job: [id number]

## 2010-11-22 ENCOUNTER — Ambulatory Visit
Admission: RE | Admit: 2010-11-22 | Discharge: 2010-11-22 | Disposition: A | Discharge status: Home / Self Care | Payer: PRIVATE HEALTH INSURANCE | Source: Ambulatory Visit | Attending: Family Medicine | Admitting: Family Medicine

## 2010-11-22 DIAGNOSIS — R928 Other abnormal and inconclusive findings on diagnostic imaging of breast: Secondary | ICD-10-CM

## 2011-04-10 LAB — D-DIMER, QUANTITATIVE: D-Dimer, Quant: 0.41

## 2011-04-16 LAB — BASIC METABOLIC PANEL
BUN: 8
CO2: 27
Calcium: 7.9 — ABNORMAL LOW
Calcium: 9.5
Creatinine, Ser: 0.8
Creatinine, Ser: 0.85
GFR calc Af Amer: >60
GFR calc non Af Amer: >60
Glucose, Bld: 122 — ABNORMAL HIGH
Potassium: 3.7

## 2011-04-16 LAB — CBC
HCT: 23.3 — ABNORMAL LOW
Platelets: 222
Platelets: 292
RDW: 26.8 — ABNORMAL HIGH
WBC: 4.3

## 2011-04-16 LAB — TYPE AND SCREEN
ABO/RH(D): B POS
Antibody Screen: NEGATIVE

## 2011-04-16 LAB — ABO/RH: ABO/RH(D): B POS

## 2013-09-11 ENCOUNTER — Ambulatory Visit: Payer: PRIVATE HEALTH INSURANCE

## 2014-02-04 ENCOUNTER — Ambulatory Visit (INDEPENDENT_AMBULATORY_CARE_PROVIDER_SITE_OTHER): Payer: Managed Care, Other (non HMO) | Admitting: Emergency Medicine

## 2014-02-04 VITALS — BP 130/68 | HR 80 | Temp 98.1°F | Resp 18 | Ht 72.75 in | Wt 216.0 lb

## 2014-02-04 DIAGNOSIS — L0501 Pilonidal cyst with abscess: Secondary | ICD-10-CM

## 2014-02-04 DIAGNOSIS — M7918 Myalgia, other site: Secondary | ICD-10-CM

## 2014-02-04 DIAGNOSIS — IMO0001 Reserved for inherently not codable concepts without codable children: Secondary | ICD-10-CM

## 2014-02-04 MED ORDER — SULFAMETHOXAZOLE-TMP DS 800-160 MG PO TABS: 1.0000 | ORAL_TABLET | Freq: Two times a day (BID) | ORAL | Status: DC

## 2014-02-04 MED ORDER — HYDROCODONE-ACETAMINOPHEN 5-325 MG PO TABS: 1.0000 | ORAL_TABLET | ORAL | Status: DC | PRN

## 2014-02-04 NOTE — Progress Notes (Signed)
[facility] [address] [place] [address] [phone]  Date:  02/04/2014   Name:  Lynn Estrada   DOB:  [birth year]   MRN:  [id number]  PCP:  No primary provider on file.    Chief Complaint: Cyst   History of Present Illness:  Lynn Estrada is a 53 y.o. very pleasant female patient who presents with the following:  Patient has painful mass at the apex of her intergluteal cleft.  She has attempted to evacuate the contents herself unsuccessfully.  She has no fever or chills. No improvement with over the counter medications or other home remedies. Denies other complaint or health concern today.   There are no active problems to display for this patient.   History reviewed. No pertinent past medical history.  History reviewed. No pertinent past surgical history.  History  Substance Use Topics  . Smoking status: Never Smoker   . Smokeless tobacco: Not on file  . Alcohol Use: Yes    History reviewed. No pertinent family history.  No Known Allergies  Medication list has been reviewed and updated.  No current outpatient prescriptions on file prior to visit.   No current facility-administered medications on file prior to visit.    Review of Systems:  As per HPI, otherwise negative.    Physical Examination: Filed Vitals:   02/04/14 1425  BP: 130/68  Pulse: 80  Temp: 98.1 F (36.7 C)  Resp: 18   Filed Vitals:   02/04/14 1425  Height: 6' 0.75" (1.848 m)  Weight: 216 lb (97.977 kg)   Body mass index is 28.69 kg/(m^2). Ideal Body Weight: Weight in (lb) to have BMI = 25: 187.8   GEN: WDWN, NAD, Non-toxic, Alert & Oriented x 3 HEENT: Atraumatic, Normocephalic.  Ears and Nose: No external deformity. EXTR: No clubbing/cyanosis/edema NEURO: Normal gait.  PSYCH: Normally interactive. Conversant. Not depressed or anxious appearing.  Calm demeanor.  Pilonidal cyst abscess  Assessment and Plan: Pilonidal cyst abscess Buttock  pain I&D Septra   Signed,  Ellison Carwin, MD

## 2014-02-04 NOTE — Patient Instructions (Signed)
Pilonidal Cyst, Care After A pilonidal cyst occurs when hairs get trapped (ingrown) beneath the skin in the crease between the buttocks over your sacrum (the bone under that crease). Pilonidal cysts are most common in young men with a lot of body hair. When the cyst breaks(ruptured) or leaks, fluid from the cyst may cause burning and itching. If the cyst becomes infected, it causes a painful swelling filled with pus (abscess). The pus and trapped hairs need to be removed (often by lancing) so that the infection can heal. The word pilonidal means hair nest. HOME CARE INSTRUCTIONS If the pilonidal sinus was NOT DRAINING OR LANCED:  Keep the area clean and dry. Bathe or shower daily. Wash the area well with a germ-killing soap. Hot tub baths may help prevent infection. Dry the area well with a towel.  Avoid tight clothing in order to keep area as moisture-free as possible.  Keep area between buttocks as free from hair as possible. A depilatory may be used.  Take antibiotics as directed.  Only take over-the-counter or prescription medicines for pain, discomfort, or fever as directed by your caregiver. If the cyst WAS INFECTED AND NEEDED TO BE DRAINED:  Your caregiver may have packed the wound with gauze to keep the wound open. This allows the wound to heal from the inside outward and continue to drain.  Return as directed for a wound check.  If you take tub baths or showers, repack the wound with gauze as directed following. Sponge baths are a good alternative. Sitz baths may be used three to four times a day or as directed.  If an antibiotic was ordered to fight the infection, take as directed.  Only take over-the-counter or prescription medicines for pain, discomfort, or fever as directed by your caregiver.  If a drain was in place and removed, use sitz baths for 20 minutes 4 times per day. Clean the wound gently with mild unscented soap, pat dry, and then apply a dry dressing as  directed. If you had surgery and IT WAS MARSUPIALIZED (LEFT OPEN):  Your wound was packed with gauze to keep the wound open. This allows the wound to heal from the inside outwards and continue draining. The changing of the dressing regularly also helps keep the wound clean.  Return as directed for a wound check.  If you take tub baths or showers, repack the wound with gauze as directed following. Sponge baths are a good alternative. Sitz baths can also be used. This may be done three to four times a day or as directed.  If an antibiotic was ordered to fight the infection, take as directed.  Only take over-the-counter or prescription medicines for pain, discomfort, or fever as directed by your caregiver.  If you had surgery and the wound was closed you may care for it as directed. This generally includes keeping it dry and clean and dressing it as directed. SEEK MEDICAL CARE IF:   You have increased pain, swelling, redness, drainage, or bleeding from the area.  You have a fever.  You have muscles aches, dizziness, or a general ill feeling. Document Released: 07/19/2006 Document Revised: 02/18/2013 Document Reviewed: 10/03/2006 [facility] Patient Information 2015 [place], [place]. This information is not intended to replace advice given to you by your health care provider. Make sure you discuss any questions you have with your health care provider. Pilonidal Cyst A pilonidal cyst occurs when hairs get trapped (ingrown) beneath the skin in the crease between the buttocks over your  sacrum (the bone under that crease). Pilonidal cysts are most common in young men with a lot of body hair. When the cyst is ruptured (breaks) or leaking, fluid from the cyst may cause burning and itching. If the cyst becomes infected, it causes a painful swelling filled with pus (abscess). The pus and trapped hairs need to be removed (often by lancing) so that the infection can heal. However, recurrence is common and an  operation may be needed to remove the cyst. HOME CARE INSTRUCTIONS   If the cyst was NOT INFECTED:  Keep the area clean and dry. Bathe or shower daily. Wash the area well with a germ-killing soap. Warm tub baths may help prevent infection and help with drainage. Dry the area well with a towel.  Avoid tight clothing to keep area as moisture free as possible.  Keep area between buttocks as free of hair as possible. A depilatory may be used.  If the cyst WAS INFECTED and needed to be drained:  Your caregiver packed the wound with gauze to keep the wound open. This allows the wound to heal from the inside outwards and continue draining.  Return for a wound check in 1 day or as suggested.  If you take tub baths or showers, repack the wound with gauze following them. Sponge baths (at the sink) are a good alternative.  If an antibiotic was ordered to fight the infection, take as directed.  Only take over-the-counter or prescription medicines for pain, discomfort, or fever as directed by your caregiver.  After the drain is removed, use sitz baths for 20 minutes 4 times per day. Clean the wound gently with mild unscented soap, pat dry, and then apply a dry dressing. SEEK MEDICAL CARE IF:   You have increased pain, swelling, redness, drainage, or bleeding from the area.  You have a fever.  You have muscles aches, dizziness, or a general ill feeling. Document Released: 06/15/2000 Document Revised: 09/10/2011 Document Reviewed: 08/13/2008 [facility] Patient Information 2015 [place], [place]. This information is not intended to replace advice given to you by your health care provider. Make sure you discuss any questions you have with your health care provider.

## 2014-02-07 ENCOUNTER — Ambulatory Visit (INDEPENDENT_AMBULATORY_CARE_PROVIDER_SITE_OTHER): Payer: Managed Care, Other (non HMO) | Admitting: Physician Assistant

## 2014-02-07 VITALS — BP 130/82 | HR 64 | Temp 98.4°F | Resp 16 | Ht 73.0 in | Wt 215.2 lb

## 2014-02-07 DIAGNOSIS — T7840XA Allergy, unspecified, initial encounter: Secondary | ICD-10-CM

## 2014-02-07 DIAGNOSIS — L0501 Pilonidal cyst with abscess: Secondary | ICD-10-CM

## 2014-02-07 MED ORDER — RANITIDINE HCL 150 MG PO TABS: 150.0000 mg | ORAL_TABLET | Freq: Two times a day (BID) | ORAL | Status: DC

## 2014-02-07 MED ORDER — CETIRIZINE HCL 10 MG PO TABS: 10.0000 mg | ORAL_TABLET | Freq: Every day | ORAL | Status: DC

## 2014-02-07 MED ORDER — DOXYCYCLINE HYCLATE 100 MG PO CAPS: 100.0000 mg | ORAL_CAPSULE | Freq: Two times a day (BID) | ORAL | Status: DC

## 2014-02-07 MED ORDER — HYDROXYZINE HCL 25 MG PO TABS: 25.0000 mg | ORAL_TABLET | Freq: Every day | ORAL | Status: DC

## 2014-02-07 NOTE — Progress Notes (Signed)
Patient ID: Lynn Estrada MRN: [id number], DOB: March 17, 1961 53 y.o. Date of Encounter: 02/07/2014, 11:16 AM  Chief Complaint: Wound care   See previous note  HPI: 53 y.o. y/o female presents for wound care s/p I&D on 02/07/14.  Doing well No issues or complaints Afebrile/ no chills No nausea or vomiting Reports since starting the Bactrim, she has notice several hives erupting.  Describes intense pruritis. No trouble breathing or SOB.  Has noticed some tongue swelling.  She has dc'ed the medication.  Pain improving.  Daily dressing change Previous note reviewed  No past medical history on file.   Home Meds: Prior to Admission medications   Medication Sig Start Date End Date Taking? Authorizing Provider  hydrochlorothiazide (MICROZIDE) 12.5 MG capsule Take 12.5 mg by mouth daily.   Yes Historical Provider, MD  cetirizine (ZYRTEC) 10 MG tablet Take 1 tablet (10 mg total) by mouth daily. 02/07/14   Collene Leyden, PA-C  doxycycline (VIBRAMYCIN) 100 MG capsule Take 1 capsule (100 mg total) by mouth 2 (two) times daily. 02/07/14   Collene Leyden, PA-C  hydrOXYzine (ATARAX/VISTARIL) 25 MG tablet Take 1 tablet (25 mg total) by mouth at bedtime. 02/07/14   [name], PA-C  ranitidine (ZANTAC) 150 MG tablet Take 1 tablet (150 mg total) by mouth 2 (two) times daily. 02/07/14   Collene Leyden, PA-C  sulfamethoxazole-trimethoprim (BACTRIM DS) 800-160 MG per tablet Take 1 tablet by mouth 2 (two) times daily. 02/04/14   Ellison Carwin, MD    Allergies:  Allergies  Allergen Reactions  . Bactrim [Sulfamethoxazole-Tmp Ds]     rash    ROS: Constitutional: Afebrile, no chills Cardiovascular: negative for chest pain or palpitations Dermatological: Positive for wound. Negative for erythema, pain, or warmth. GI: No nausea or vomiting   EXAM: Physical Exam: Blood pressure 130/82, pulse 64, temperature 98.4 F (36.9 C), temperature source Oral, resp. rate 16, height 6\' 1"  (1.854 m), weight  215 lb 3.2 oz (97.614 kg), SpO2 98.00%., Body mass index is 28.4 kg/(m^2). General: Well developed, well nourished, in no acute distress. Nontoxic appearing. Head: Normocephalic, atraumatic, sclera non-icteric.  Neck: Supple. Lungs: Breathing is unlabored. Heart: Normal rate. Skin:  Warm and moist. Dressing and packing in place. No induration, erythema, or tenderness to palpation. Neuro: Alert and oriented X 3. Moves all extremities spontaneously. Normal gait.  Psych:  Responds to questions appropriately with a normal affect.       PROCEDURE: Dressing and packing removed. No purulence expressed. Small amount of sebaceous material removed.  Wound bed healthy Irrigated with 1% plain lidocaine 5 cc. Repacked with 1/4 plain packing.  Dressing applied  LAB: Culture: not done.   A/P: 53 y.o. y/o female with buttocks cellulitis/abscess as above s/p I&D on 02/04/14. Wound care per above Change to doxycycline 100 mg bid x 10 days.  Pain well controlled Daily dressing changes Recheck 48 hours  Signed, Georgiann Mccoy, PA-C 02/07/2014 11:16 AM

## 2014-02-09 ENCOUNTER — Ambulatory Visit (INDEPENDENT_AMBULATORY_CARE_PROVIDER_SITE_OTHER): Payer: Managed Care, Other (non HMO) | Admitting: Physician Assistant

## 2014-02-09 VITALS — BP 130/86 | HR 62 | Temp 98.4°F | Resp 16 | Ht 73.0 in | Wt 217.1 lb

## 2014-02-09 DIAGNOSIS — L089 Local infection of the skin and subcutaneous tissue, unspecified: Secondary | ICD-10-CM

## 2014-02-09 DIAGNOSIS — L500 Allergic urticaria: Secondary | ICD-10-CM

## 2014-02-09 DIAGNOSIS — L723 Sebaceous cyst: Secondary | ICD-10-CM

## 2014-02-09 NOTE — Patient Instructions (Signed)
Use the Vistaril at bedtime as needed for itching. During the daytime, use Claritin, Zyrtec or Allegra. Do not get the Doxycycline filled, it doesn't appear that you need it at this point.

## 2014-02-09 NOTE — Progress Notes (Signed)
   Subjective:    Patient ID: Lynn Estrada, female    DOB: February 27, 1961, 53 y.o.   MRN: [id number]   PCP: No PCP Per Patient  Chief Complaint  Patient presents with  . Wound Care    Prior to Admission medications   Medication Sig Start Date End Date Taking? Authorizing Provider  hydrochlorothiazide (MICROZIDE) 12.5 MG capsule Take 12.5 mg by mouth daily.   Yes Historical Provider, MD  cetirizine (ZYRTEC) 10 MG tablet Take 1 tablet (10 mg total) by mouth daily. 02/07/14   Collene Leyden, PA-C  doxycycline (VIBRAMYCIN) 100 MG capsule Take 1 capsule (100 mg total) by mouth 2 (two) times daily. 02/07/14   Collene Leyden, PA-C  hydrOXYzine (ATARAX/VISTARIL) 25 MG tablet Take 1 tablet (25 mg total) by mouth at bedtime. 02/07/14   Heather Elnora Morrison, PA-C  ranitidine (ZANTAC) 150 MG tablet Take 1 tablet (150 mg total) by mouth 2 (two) times daily. 02/07/14   Collene Leyden, PA-C     HPI  Presents for wound care. Pilonidal cyst I&D'd on 08/06.  Wound care performed on 8/09.  She reacted to the Bactrim prescribed with hives, so stopped it.  She was prescribed Vistaril, Ranitidine and Doxycycline.  The vistaril caused too much drowsiness, and her insurance wouldn't authorize the Doxycycline, which was too expensive for her.  The itching is much better though she still has some dark patches of skin where the hives erupted.  The site of the cyst is feeling much better.  It's mildly tender. No fever, chills, nausea or vomiting.  Review of Systems     Objective:   Physical Exam  BP 130/86  Pulse 62  Temp(Src) 98.4 F (36.9 C) (Oral)  Resp 16  Ht 6\' 1"  (1.854 m)  Wt 217 lb 2 oz (98.487 kg)  BMI 28.65 kg/m2  SpO2 98% WDWNBF, A&O x 3.  Dressing and packing removed.  No additional purulence expressed, though a moderate amount of sebaceous material and cyst sac were expressed and removed with pick-ups.  Evidence of a dilated pore, consistent with a sebaceous cyst is noted at the medial aspect of  the surgical incision.  No induration.  Minimal tenderness.  Irrigated with 3 cc of 2% lidocaine plain.  The wound was repacked with 1/2 inch plain packing; the wound cavity is deep medially, following the dilated pore.  Laterally, the wound is shallow and only as deep as the initial incision. Redressed.      Assessment & Plan:  1. Infected sebaceous cyst of skin Considerable improvement off antibiotics.  Elected NOT to start doxycycline at this time. Continue local wound care.  RTC 48 hours with Ms. Marte for wound care.  2. Allergic urticaria Stay off antibiotics at this point.  Good skin hygiene reviewed.   Fara Chute, PA-C Physician Assistant-Certified [facility]

## 2014-02-11 ENCOUNTER — Ambulatory Visit (INDEPENDENT_AMBULATORY_CARE_PROVIDER_SITE_OTHER): Payer: Managed Care, Other (non HMO) | Admitting: Physician Assistant

## 2014-02-11 VITALS — BP 158/94 | HR 83 | Temp 98.5°F | Resp 16 | Ht 72.0 in | Wt 214.0 lb

## 2014-02-11 DIAGNOSIS — L089 Local infection of the skin and subcutaneous tissue, unspecified: Secondary | ICD-10-CM

## 2014-02-11 DIAGNOSIS — L723 Sebaceous cyst: Secondary | ICD-10-CM

## 2014-02-11 NOTE — Progress Notes (Signed)
Patient ID: CURTISTINE PETTITT MRN: [id number], DOB: 05/01/1961 53 y.o. Date of Encounter: 02/11/2014, 8:46 PM  Chief Complaint: Wound care   See previous note  HPI: 53 y.o. y/o female presents for wound care s/p I&D on 02/04/14. Doing well No issues or complaints Afebrile/ no chills No nausea or vomiting Did not pick up doxycycline due to cost.  Pain improved. Daily dressing change Previous note reviewed  History reviewed. No pertinent past medical history.   Home Meds: Prior to Admission medications   Medication Sig Start Date End Date Taking? Authorizing Provider  cetirizine (ZYRTEC) 10 MG tablet Take 1 tablet (10 mg total) by mouth daily. 02/07/14  Yes Kento Gossman M Jawaun Celmer, PA-C  doxycycline (VIBRAMYCIN) 100 MG capsule Take 1 capsule (100 mg total) by mouth 2 (two) times daily. 02/07/14  Yes [place], PA-C  hydrochlorothiazide (MICROZIDE) 12.5 MG capsule Take 12.5 mg by mouth daily.   Yes Historical Provider, MD  hydrOXYzine (ATARAX/VISTARIL) 25 MG tablet Take 1 tablet (25 mg total) by mouth at bedtime. 02/07/14  Yes Vantasia Pinkney M Jeris Easterly, PA-C  ranitidine (ZANTAC) 150 MG tablet Take 1 tablet (150 mg total) by mouth 2 (two) times daily. 02/07/14  Yes Collene Leyden, PA-C    Allergies:  Allergies  Allergen Reactions  . Bactrim [Sulfamethoxazole-Tmp Ds]     rash    ROS: Constitutional: Afebrile, no chills Cardiovascular: negative for chest pain or palpitations Dermatological: Positive for wound. Negative for erythema, pain, or warmth  GI: No nausea or vomiting   EXAM: Physical Exam: Blood pressure 158/94, pulse 83, temperature 98.5 F (36.9 C), resp. rate 16, height 6' (1.829 m), weight 214 lb (97.07 kg), SpO2 97.00%., Body mass index is 29.02 kg/(m^2). General: Well developed, well nourished, in no acute distress. Nontoxic appearing. Head: Normocephalic, atraumatic, sclera non-icteric.  Neck: Supple. Lungs: Breathing is unlabored. Heart: Normal rate. Skin:  Warm and moist.  Dressing and packing in place. No induration, erythema, or tenderness to palpation. Neuro: Alert and oriented X 3. Moves all extremities spontaneously. Normal gait.  Psych:  Responds to questions appropriately with a normal affect.       PROCEDURE: Dressing and packing removed. No purulence expressed Wound bed healthy Irrigated with 1% plain lidocaine 5 cc. Not repacked.  Dressing applied  LAB: Culture: not done  A/P: 53 y.o. y/o female with buttocks cellulitis/abscess as above s/p I&D on 02/04/14 Wound care per above Pain well controlled Daily dressing changes Recheck as needed.   Angela Nevin, PA-C 02/11/2014 8:46 PM

## 2015-07-26 ENCOUNTER — Ambulatory Visit (INDEPENDENT_AMBULATORY_CARE_PROVIDER_SITE_OTHER): Payer: Managed Care, Other (non HMO) | Admitting: Physician Assistant

## 2015-07-26 ENCOUNTER — Encounter: Payer: Self-pay | Admitting: Physician Assistant

## 2015-07-26 VITALS — BP 150/96 | HR 84 | Temp 98.4°F | Resp 20 | Ht 73.5 in | Wt 218.0 lb

## 2015-07-26 DIAGNOSIS — Z1159 Encounter for screening for other viral diseases: Secondary | ICD-10-CM | POA: Diagnosis not present

## 2015-07-26 DIAGNOSIS — Z6829 Body mass index (BMI) 29.0-29.9, adult: Secondary | ICD-10-CM | POA: Insufficient documentation

## 2015-07-26 DIAGNOSIS — Z114 Encounter for screening for human immunodeficiency virus [HIV]: Secondary | ICD-10-CM | POA: Diagnosis not present

## 2015-07-26 DIAGNOSIS — Z0000 Encounter for general adult medical examination without abnormal findings: Secondary | ICD-10-CM | POA: Diagnosis not present

## 2015-07-26 DIAGNOSIS — Z1231 Encounter for screening mammogram for malignant neoplasm of breast: Secondary | ICD-10-CM | POA: Diagnosis not present

## 2015-07-26 DIAGNOSIS — Z1211 Encounter for screening for malignant neoplasm of colon: Secondary | ICD-10-CM | POA: Diagnosis not present

## 2015-07-26 DIAGNOSIS — G5603 Carpal tunnel syndrome, bilateral upper limbs: Secondary | ICD-10-CM | POA: Diagnosis not present

## 2015-07-26 DIAGNOSIS — G4700 Insomnia, unspecified: Secondary | ICD-10-CM

## 2015-07-26 DIAGNOSIS — I10 Essential (primary) hypertension: Secondary | ICD-10-CM

## 2015-07-26 DIAGNOSIS — Z13220 Encounter for screening for lipoid disorders: Secondary | ICD-10-CM

## 2015-07-26 DIAGNOSIS — L309 Dermatitis, unspecified: Secondary | ICD-10-CM | POA: Diagnosis not present

## 2015-07-26 DIAGNOSIS — Z6828 Body mass index (BMI) 28.0-28.9, adult: Secondary | ICD-10-CM

## 2015-07-26 DIAGNOSIS — E663 Overweight: Secondary | ICD-10-CM | POA: Insufficient documentation

## 2015-07-26 LAB — COMPREHENSIVE METABOLIC PANEL
ALK PHOS: 58 U/L (ref 33–130)
ALT: 21 U/L (ref 6–29)
AST: 22 U/L (ref 10–35)
Albumin: 4.3 g/dL (ref 3.6–5.1)
BILIRUBIN TOTAL: 0.4 mg/dL (ref 0.2–1.2)
BUN: 14 mg/dL (ref 7–25)
CALCIUM: 9.2 mg/dL (ref 8.6–10.4)
CO2: 27 mmol/L (ref 20–31)
Chloride: 104 mmol/L (ref 98–110)
Creat: 0.79 mg/dL (ref 0.50–1.05)
GLUCOSE: 82 mg/dL (ref 65–99)
Potassium: 3.5 mmol/L (ref 3.5–5.3)
Sodium: 140 mmol/L (ref 135–146)
TOTAL PROTEIN: 7.4 g/dL (ref 6.1–8.1)

## 2015-07-26 LAB — CBC WITH DIFFERENTIAL/PLATELET
BASOS ABS: 0 10*3/uL (ref 0.0–0.1)
Basophils Relative: 0 % (ref 0–1)
EOS PCT: 2 % (ref 0–5)
Eosinophils Absolute: 0.1 10*3/uL (ref 0.0–0.7)
HEMATOCRIT: 35.5 % — AB (ref 36.0–46.0)
HEMOGLOBIN: 11.9 g/dL — AB (ref 12.0–15.0)
LYMPHS ABS: 1.7 10*3/uL (ref 0.7–4.0)
Lymphocytes Relative: 35 % (ref 12–46)
MCH: 28.2 pg (ref 26.0–34.0)
MCHC: 33.5 g/dL (ref 30.0–36.0)
MCV: 84.1 fL (ref 78.0–100.0)
MPV: 10.8 fL (ref 8.6–12.4)
Monocytes Absolute: 0.3 10*3/uL (ref 0.1–1.0)
Monocytes Relative: 7 % (ref 3–12)
NEUTROS PCT: 56 % (ref 43–77)
Neutro Abs: 2.7 10*3/uL (ref 1.7–7.7)
Platelets: 303 10*3/uL (ref 150–400)
RBC: 4.22 MIL/uL (ref 3.87–5.11)
RDW: 14.4 % (ref 11.5–15.5)
WBC: 4.8 10*3/uL (ref 4.0–10.5)

## 2015-07-26 LAB — LIPID PANEL
Cholesterol: 195 mg/dL (ref 125–200)
HDL: 32 mg/dL — AB (ref >=46)
LDL Cholesterol: 131 mg/dL — ABNORMAL HIGH (ref <130)
Total CHOL/HDL Ratio: 6.1 Ratio — ABNORMAL HIGH (ref <=5.0)
Triglycerides: 161 mg/dL — ABNORMAL HIGH (ref <150)
VLDL: 32 mg/dL — ABNORMAL HIGH (ref <30)

## 2015-07-26 LAB — TSH: TSH: 1.707 u[IU]/mL (ref 0.350–4.500)

## 2015-07-26 MED ORDER — HYDROXYZINE HCL 25 MG PO TABS: 25.0000 mg | ORAL_TABLET | Freq: Every evening | ORAL | Status: DC | PRN

## 2015-07-26 MED ORDER — KETOCONAZOLE 2 % EX CREA: 1.0000 "application " | TOPICAL_CREAM | Freq: Every day | CUTANEOUS | Status: DC

## 2015-07-26 MED ORDER — LISINOPRIL-HYDROCHLOROTHIAZIDE 10-12.5 MG PO TABS: 1.0000 | ORAL_TABLET | Freq: Every day | ORAL | Status: DC

## 2015-07-26 NOTE — Progress Notes (Signed)
Patient ID: Lynn Estrada, female    DOB: 1961/04/14, 55 y.o.   MRN: [id number]  PCP: No PCP Per Patient  Chief Complaint  Patient presents with  . Annual Exam    refill of medications--HCTZ and Triamcinolone cream. fasting today. check mole on her left breast.     Subjective:   HPI: Presents for annual wellness exam and to establish for primary care.  Cervical Cancer Screening: no longer a candidate, hysterectomy, for uterine fibroids, no history of abnormal pap testing. Breast Cancer Screening: last mammogram was 2012.  Colorectal Cancer Screening: was referred, but never scheduled. Ready to do this now. Bone Density Testing: not yet a candidate HIV Screening: not complete STI Screening: not complete Seasonal Influenza Vaccination: complete Td/Tdap Vaccination: unsure of last booster Pneumococcal Vaccination: not yet a candidate Zoster Vaccination: not yet a candidate   Patient Active Problem List   Diagnosis Date Noted  . Benign essential HTN 07/26/2015  . Eczema 07/26/2015  . BMI 28.0-28.9,adult 07/26/2015    Past Medical History  Diagnosis Date  . Hypertension   . Fibroid, uterine      Prior to Admission medications   Medication Sig Start Date End Date Taking? Authorizing Provider  hydrochlorothiazide (MICROZIDE) 12.5 MG capsule Take 12.5 mg by mouth daily.   Yes Historical Provider, MD  triamcinolone cream (KENALOG) 0.1 % Apply 1 application topically 2 (two) times daily.   Yes Historical Provider, MD    Allergies  Allergen Reactions  . Bactrim [Sulfamethoxazole-Trimethoprim]     rash    Past Surgical History  Procedure Laterality Date  . Back surgery  1996    lumbar  . Partial nephrectomy Right     due to large kidney stone  . Abdominal hysterectomy      uterine fibroids; ovaries remain  . Cholecystectomy      Family History  Problem Relation Age of Onset  . Hypertension Sister   . Eczema Sister   . Lupus Sister     Social History    Social History  . Marital Status: Married    Spouse Name: Richard Austill  . Number of Children: 1  . 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 Smoking status: Former Smoker -- 0.75 packs/day for 5 years    Types: Cigarettes  . Smokeless tobacco: Never Used     Comment: smoked back in her 30's  . Alcohol Use: 0.0 oz/week    0 Standard drinks or equivalent per week     Comment: social use; once a month  . Drug Use: No  . Sexual Activity:    Partners: Male    Birth Control/ Protection: Surgical   Other Topics Concern  . None   Social History Narrative   Lives with her husband and their daughter and their dog.       Review of Systems  Constitutional: Positive for fatigue. Negative for fever, chills, diaphoresis, activity change, appetite change and unexpected weight change.       Working 7 days/week 3 of 4 weeks/month due to large contract at work.  HENT: Negative.   Eyes: Negative.   Respiratory: Negative.   Cardiovascular: Negative.   Gastrointestinal: Positive for constipation. Negative for nausea, vomiting, abdominal pain, diarrhea, blood in stool, abdominal distention, anal bleeding and rectal pain.  Endocrine: Positive for heat intolerance (hot flashes since hysterectomy, even though ovaries remain). Negative for cold intolerance, polydipsia, polyphagia  and polyuria.  Genitourinary: Negative for dysuria, urgency, frequency and hematuria.  Musculoskeletal: Positive for arthralgias (both hands, feel tingling and painful). Negative for myalgias, back pain, joint swelling, gait problem, neck pain and neck stiffness.  Skin: Positive for rash (LEFT foot x years. Diagnosed as eczema.).       Nevus, LEFT breast, "forever," but seems to be growing in size  Allergic/Immunologic: Negative.   Neurological: Positive for numbness (hands). Negative for dizziness, weakness, light-headedness and headaches.   Hematological: Negative for adenopathy. Does not bruise/bleed easily.  Psychiatric/Behavioral: Positive for sleep disturbance (difficulty falling asleep. Often gets 4 hours/night. "Just lie there."). Negative for suicidal ideas, hallucinations, behavioral problems, confusion, self-injury, dysphoric mood, decreased concentration and agitation. The patient is not nervous/anxious and is not hyperactive.         Objective:  Physical Exam  Constitutional: She is oriented to person, place, and time. Vital signs are normal. She appears well-developed and well-nourished. She is active and cooperative. No distress.  BP 150/96 mmHg  Pulse 84  Temp(Src) 98.4 F (36.9 C) (Oral)  Resp 20  Ht 6' 1.5" (1.867 m)  Wt 218 lb (98.884 kg)  BMI 28.37 kg/m2  SpO2 97%   HENT:  Head: Normocephalic and atraumatic.  Right Ear: Hearing, tympanic membrane, external ear and ear canal normal. No foreign bodies.  Left Ear: Hearing, tympanic membrane, external ear and ear canal normal. No foreign bodies.  Nose: Nose normal.  Mouth/Throat: Uvula is midline, oropharynx is clear and moist and mucous membranes are normal. No oral lesions. Normal dentition. No dental abscesses or uvula swelling. No oropharyngeal exudate.  Eyes: Conjunctivae, EOM and lids are normal. Pupils are equal, round, and reactive to light. Right eye exhibits no discharge. Left eye exhibits no discharge. No scleral icterus.  Fundoscopic exam:      The right eye shows no arteriolar narrowing, no AV nicking, no exudate, no hemorrhage and no papilledema. The right eye shows red reflex.       The left eye shows no arteriolar narrowing, no AV nicking, no exudate, no hemorrhage and no papilledema. The left eye shows red reflex.  Neck: Trachea normal, normal range of motion and full passive range of motion without pain. Neck supple. No spinous process tenderness and no muscular tenderness present. No thyroid mass and no thyromegaly present.   Cardiovascular: Normal rate, regular rhythm, normal heart sounds, intact distal pulses and normal pulses.   Pulmonary/Chest: Effort normal and breath sounds normal. Right breast exhibits no inverted nipple, no mass, no nipple discharge, no skin change and no tenderness. Left breast exhibits no inverted nipple, no mass, no nipple discharge, no skin change and no tenderness. Breasts are symmetrical.    Abdominal: Soft. Bowel sounds are normal. She exhibits no distension and no mass. There is no tenderness. There is no rebound and no guarding.  Musculoskeletal: She exhibits no edema or tenderness.       Right wrist: Normal.       Left wrist: Normal.       Cervical back: Normal.       Thoracic back: Normal.       Lumbar back: Normal.       Right hand: Normal. Normal sensation noted. Normal strength noted.       Left hand: Normal. Normal sensation noted. Normal strength noted.  Symptoms are reproduced with Phalen's test bilaterally.  Lymphadenopathy:       Head (right side): No tonsillar, no preauricular, no posterior auricular and no  occipital adenopathy present.       Head (left side): No tonsillar, no preauricular, no posterior auricular and no occipital adenopathy present.    She has no cervical adenopathy.       Right: No supraclavicular adenopathy present.       Left: No supraclavicular adenopathy present.  Neurological: She is alert and oriented to person, place, and time. She has normal strength and normal reflexes. No cranial nerve deficit. She exhibits normal muscle tone. Coordination and gait normal.  Skin: Skin is warm, dry and intact. Rash noted. She is not diaphoretic. No cyanosis or erythema. Nails show no clubbing.  LEFT foot notable for peeling skin, and resolving vesicular lesions in various stages of healing. The toenails are hypertrophic, but well trimmed. There is a slight odor of tinea present. Some hypopigmentation noted between the toes and along the edges of the foot, both  medially and laterally suggestive of the chronicity described by the patient.  Psychiatric: She has a normal mood and affect. Her speech is normal and behavior is normal. Judgment and thought content normal.           Assessment & Plan:  1. Annual physical exam Age appropriate anticipatory guidance provided.  2. Benign essential HTN Not controlled. Stop HCTZ. Start lisinoprilHCTZ.  - CBC with Differential/Platelet - Comprehensive metabolic panel - TSH - lisinopril-hydrochlorothiazide (PRINZIDE,ZESTORETIC) 10-12.5 MG tablet; Take 1 tablet by mouth daily.  Dispense: 90 tablet; Refill: 3  3. Insomnia Trial of hydroxyzine, which may also help the itching on the foot. - hydrOXYzine (ATARAX/VISTARIL) 25 MG tablet; Take 1-3 tablets (25-75 mg total) by mouth at bedtime as needed (insomnia).  Dispense: 90 tablet; Refill: 2  4. Eczema I suspect that this is actually tinea pedis. Will contact her previous PCP to identify the dermatologist and try to obtain records from there. Trial off the steroid cream, try ketoconazole instead. - ketoconazole (NIZORAL) 2 % cream; Apply 1 application topically daily.  Dispense: 60 g; Refill: 1 - hydrOXYzine (ATARAX/VISTARIL) 25 MG tablet; Take 1-3 tablets (25-75 mg total) by mouth at bedtime as needed (insomnia).  Dispense: 90 tablet; Refill: 2  5. Bilateral carpal tunnel syndrome Bilateral wrist splints.  6. BMI 28.0-28.9,adult Healthy eating, regular exercise, see above regarding sleep.  7. Screening for HIV (human immunodeficiency virus) - HIV antibody  8. Need for hepatitis C screening test - Hepatitis C antibody  9. Screening for hyperlipidemia - Lipid panel  10. Encounter for screening mammogram for breast cancer - MM Digital Screening; Future  11. Screening for colon cancer - Ambulatory referral to Gastroenterology   Return in about 6 weeks (around 09/06/2015) for re-evaluation of blood pressure, and for biopsy of breast  lesion.   Fara Chute, PA-C Physician Assistant-Certified [facility]

## 2015-07-26 NOTE — Patient Instructions (Addendum)
Stop the HCTZ. Instead take the lisinoprilHCTZ.  I will contact you with your lab results as soon as they are available.   If you have not heard from me in 2 weeks, please contact me.  The fastest way to get your results is to register for My Chart (see the instructions on the last page of this printout).  We recommend that you schedule a mammogram for breast cancer screening. Typically, you do not need a referral to do this. Please contact a local imaging center to schedule your mammogram.  [facility] - [phone]  *ask for the Radiology Department [facility] ([place]) - [phone] or [phone]  [facility] - [phone] [place] [phone] [facility]  - [phone]  *ask for the [facility] - [phone]  *ask for the Radiology Department [facility] - [phone]  *ask for the [place] - [phone]  Keeping You Healthy  Get These Tests  Blood Pressure- Have your blood pressure checked by your healthcare provider at least once a year.  Normal blood pressure is 120/80.  Weight- Have your body mass index (BMI) calculated to screen for obesity.  BMI is a measure of body fat based on height and weight.  You can calculate your own BMI at GravelBags.it  Cholesterol- Have your cholesterol checked every year.  Diabetes- Have your blood sugar checked every year if you have high blood pressure, high cholesterol, a family history of diabetes or if you are overweight.  Pap Test - Have a pap test every 1 to 5 years if you have been sexually active.  If you are older than 65 and recent pap tests have been normal you may not need additional pap tests.  In addition, if you have had a hysterectomy  for benign disease additional pap tests are not necessary.  Mammogram-Yearly mammograms are essential for early detection  of breast cancer  Screening for Colon Cancer- Colonoscopy starting at age 10. Screening may begin sooner depending on your family history and other health conditions.  Follow up colonoscopy as directed by your Gastroenterologist.  Screening for Osteoporosis- Screening begins at age 71 with bone density scanning, sooner if you are at higher risk for developing Osteoporosis.  Get these medicines  Calcium with Vitamin D- Your body requires 1200-1500 mg of Calcium a day and [phone] IU of Vitamin D a day.  You can only absorb 500 mg of Calcium at a time therefore Calcium must be taken in 2 or 3 separate doses throughout the day.  Hormones- Hormone therapy has been associated with increased risk for certain cancers and heart disease.  Talk to your healthcare provider about if you need relief from menopausal symptoms.  Aspirin- Ask your healthcare provider about taking Aspirin to prevent Heart Disease and Stroke.  Get these Immuniztions  Flu shot- Every fall  Pneumonia shot- Once after the age of 74; if you are younger ask your healthcare provider if you need a pneumonia shot.  Tetanus- Every ten years.  Zostavax- Once after the age of 41 to prevent shingles.  Take these steps  Don't smoke- Your healthcare provider can help you quit. For tips on how to quit, ask your healthcare provider or go to www.smokefree.gov or call 1-800 QUIT-NOW.  Be physically active- Exercise 5 days a week for a minimum of 30 minutes.  If you are not already physically active, start slow and  gradually work up to 30 minutes of moderate physical activity.  Try walking, dancing, bike riding, swimming, etc.  Eat a healthy diet- Eat a variety of healthy foods such as fruits, vegetables, whole grains, low fat milk, low fat cheeses, yogurt, lean meats, chicken, fish, eggs, dried beans, tofu, etc.  For more information go to www.thenutritionsource.org  Dental visit- Brush and floss teeth twice daily; visit your dentist  twice a year.  Eye exam- Visit your Optometrist or Ophthalmologist yearly.  Drink alcohol in moderation- Limit alcohol intake to one drink or less a day.  Never drink and drive.  Depression- Your emotional health is as important as your physical health.  If you're feeling down or losing interest in things you normally enjoy, please talk to your healthcare provider.  Seat Belts- can save your life; always wear one  Smoke/Carbon Monoxide detectors- These detectors need to be installed on the appropriate level of your home.  Replace batteries at least once a year.  Violence- If anyone is threatening or hurting you, please tell your healthcare provider.  Living Will/ Health care power of attorney- Discuss with your healthcare provider and family.   To help reduce constipation and promote bowel health, 1. Drink at least 64 ounces of water each day; 2. Eat plenty of fiber (fruits, vegetables, whole grains, legumes) 3. Get plenty of physical activity  If needed, use a stool softener (docusate) or an osmotic laxative (like Miralax) each day, or as needed.

## 2015-07-27 ENCOUNTER — Other Ambulatory Visit: Payer: Self-pay

## 2015-07-27 ENCOUNTER — Telehealth: Payer: Self-pay

## 2015-07-27 DIAGNOSIS — Z1231 Encounter for screening mammogram for malignant neoplasm of breast: Secondary | ICD-10-CM

## 2015-07-27 LAB — HEPATITIS C ANTIBODY: HCV AB: NEGATIVE

## 2015-07-27 LAB — HIV ANTIBODY (ROUTINE TESTING W REFLEX): HIV: NONREACTIVE

## 2015-07-27 NOTE — Telephone Encounter (Signed)
Patient was seen yesterday with Lynn Estrada and was given 2 braces for her hands. She states her job requires her to submerge her hands in alcohol and she is unable to do that when wearing the braces. She is requesting a work note stating how many days and duration will she need to wear them. This is information is being requested by her employer. She is requesting to pick up the note today after 2:30 pm because she is required to turn it in by tomorrow. Patients call back number is [phone]

## 2015-07-28 NOTE — Telephone Encounter (Addendum)
-----   Message from Nickolas Madrid, [place] sent at 07/27/2015 12:47 PM EST ----- Barth Kirks Redmon referred patient to Dr. Rinaldo Ratel with [facility] [phone] for rash on left foot.  Please contact [facility] for notes related to the rash on the LEFT foot.     WITH REGARD TO THE WRIST SPLINTS: I recommend that she remove the splints when she must submerge them. If she wears them only at night, even, her symptoms will benefit.  Let's wait to modify her work until we see if the symptoms resolve wearing the splints for a limited time each day.

## 2015-07-28 NOTE — Telephone Encounter (Signed)
Patient calling back requesting to pick up this letter as soon as possible. Per patient her employer is demanding she turn it in. Please call patient at [phone] when ready for pick up

## 2015-07-28 NOTE — Telephone Encounter (Signed)
Advised pt. Pt understood

## 2015-08-08 ENCOUNTER — Telehealth: Payer: Self-pay

## 2015-08-30 ENCOUNTER — Other Ambulatory Visit: Payer: Self-pay | Admitting: Physician Assistant

## 2015-08-30 DIAGNOSIS — Z1231 Encounter for screening mammogram for malignant neoplasm of breast: Secondary | ICD-10-CM

## 2015-09-13 ENCOUNTER — Ambulatory Visit: Payer: PRIVATE HEALTH INSURANCE

## 2015-10-04 ENCOUNTER — Ambulatory Visit: Payer: Managed Care, Other (non HMO) | Admitting: Physician Assistant

## 2015-12-07 ENCOUNTER — Encounter: Payer: Self-pay | Admitting: Family Medicine

## 2015-12-07 ENCOUNTER — Ambulatory Visit (INDEPENDENT_AMBULATORY_CARE_PROVIDER_SITE_OTHER): Payer: Managed Care, Other (non HMO) | Admitting: Physician Assistant

## 2015-12-07 VITALS — BP 148/90 | HR 70 | Temp 98.0°F | Resp 16 | Ht 72.0 in | Wt 212.0 lb

## 2015-12-07 DIAGNOSIS — R350 Frequency of micturition: Secondary | ICD-10-CM

## 2015-12-07 DIAGNOSIS — R109 Unspecified abdominal pain: Secondary | ICD-10-CM | POA: Diagnosis not present

## 2015-12-07 DIAGNOSIS — B353 Tinea pedis: Secondary | ICD-10-CM | POA: Diagnosis not present

## 2015-12-07 DIAGNOSIS — R319 Hematuria, unspecified: Secondary | ICD-10-CM

## 2015-12-07 LAB — POCT CBC
GRANULOCYTE PERCENT: 64.3 % (ref 37–80)
HCT, POC: 34.8 % — AB (ref 37.7–47.9)
HEMOGLOBIN: 12.1 g/dL — AB (ref 12.2–16.2)
Lymph, poc: 1.8 (ref 0.6–3.4)
MCH, POC: 28.5 pg (ref 27–31.2)
MCHC: 34.8 g/dL (ref 31.8–35.4)
MCV: 81.9 fL (ref 80–97)
MID (cbc): 0.4 (ref 0–0.9)
MPV: 8.4 fL (ref 0–99.8)
PLATELET COUNT, POC: 257 10*3/uL (ref 142–424)
POC GRANULOCYTE: 3.8 (ref 2–6.9)
POC LYMPH PERCENT: 29.7 %L (ref 10–50)
POC MID %: 6 %M (ref 0–12)
RBC: 4.27 M/uL (ref 4.04–5.48)
RDW, POC: 14.6 %
WBC: 5.9 10*3/uL (ref 4.6–10.2)

## 2015-12-07 LAB — POCT URINALYSIS DIP (MANUAL ENTRY)
BILIRUBIN UA: NEGATIVE
Bilirubin, UA: NEGATIVE
GLUCOSE UA: NEGATIVE
Nitrite, UA: POSITIVE — AB
SPEC GRAV UA: 1.02
UROBILINOGEN UA: 0.2
pH, UA: 7

## 2015-12-07 LAB — POC MICROSCOPIC URINALYSIS (UMFC): MUCUS RE: ABSENT

## 2015-12-07 MED ORDER — CIPROFLOXACIN HCL 500 MG PO TABS: 500.0000 mg | ORAL_TABLET | Freq: Two times a day (BID) | ORAL | Status: AC

## 2015-12-07 MED ORDER — ZINC OXIDE 13 % EX CREA: 1.0000 "application " | TOPICAL_CREAM | Freq: Every day | CUTANEOUS | Status: DC

## 2015-12-07 MED ORDER — CLOTRIMAZOLE-BETAMETHASONE 1-0.05 % EX CREA: 1.0000 "application " | TOPICAL_CREAM | Freq: Two times a day (BID) | CUTANEOUS | Status: DC

## 2015-12-07 NOTE — Patient Instructions (Addendum)
IF you received an x-ray today, you will receive an invoice from [facility]. Please contact [facility] at [phone] with questions or concerns regarding your invoice.   IF you received labwork today, you will receive an invoice from [employer]. Please contact [facility] at [phone] with questions or concerns regarding your invoice.   Our billing staff will not be able to assist you with questions regarding bills from these companies.  You will be contacted with the lab results as soon as they are available. The fastest way to get your results is to activate your My Chart account. Instructions are located on the last page of this paperwork. If you have not heard from [place] regarding the results in 2 weeks, please contact this office.    Please apply the cream to the foot.  Apply the desitin to that one area of the labia (lip of vagina). I will contact you with the result of your urine culture.  Pyelonephritis, Adult Pyelonephritis is a kidney infection. The kidneys are the organs that filter a person's blood and move waste out of the bloodstream and into the urine. Urine passes from the kidneys, through the ureters, and into the bladder. There are two main types of pyelonephritis:  Infections that come on quickly without any warning (acute pyelonephritis).  Infections that last for a long period of time (chronic pyelonephritis). In most cases, the infection clears up with treatment and does not cause further problems. More severe infections or chronic infections can sometimes spread to the bloodstream or lead to other problems with the kidneys. CAUSES This condition is usually caused by:  Bacteria traveling from the bladder to the kidney through infected urine. The urine in the bladder can become infected with bacteria from:  Bladder infection (cystitis).  Inflammation of the prostate gland (prostatitis).  Sexual intercourse, in  females.  Bacteria traveling from the bloodstream to the kidney. RISK FACTORS This condition is more likely to develop in:  Pregnant women.  Older people.  People who have diabetes.  People who have kidney stones or bladder stones.  People who have other abnormalities of the kidney or ureter.  People who have a catheter placed in the bladder.  People who have cancer.  People who are sexually active.  Women who use spermicides.  People who have had a prior urinary tract infection. SYMPTOMS Symptoms of this condition include:  Frequent urination.  Strong or persistent urge to urinate.  Burning or stinging when urinating.  Abdominal pain.  Back pain.  Pain in the side or flank area.  Fever.  Chills.  Blood in the urine, or dark urine.  Nausea.  Vomiting. DIAGNOSIS This condition may be diagnosed based on:  Medical history and physical exam.  Urine tests.  Blood tests. You may also have imaging tests of the kidneys, such as an ultrasound or CT scan. TREATMENT Treatment for this condition may depend on the severity of the infection.  If the infection is mild and is found early, you may be treated with antibiotic medicines taken by mouth. You will need to drink fluids to remain hydrated.  If the infection is more severe, you may need to stay in the hospital and receive antibiotics given directly into a vein through an IV tube. You may also need to receive fluids through an IV tube if you are not able to remain hydrated. After your hospital stay, you may need to take oral antibiotics for a period of time.  Other treatments may be required, depending on the cause of the infection. HOME CARE INSTRUCTIONS Medicines  Take over-the-counter and prescription medicines only as told by your health care provider.  If you were prescribed an antibiotic medicine, take it as told by your health care provider. Do not stop taking the antibiotic even if you start to feel  better. General Instructions  Drink enough fluid to keep your urine clear or pale yellow.  Avoid caffeine, tea, and carbonated beverages. They tend to irritate the bladder.  Urinate often. Avoid holding in urine for long periods of time.  Urinate before and after sex.  After a bowel movement, women should cleanse from front to back. Use each tissue only once.  Keep all follow-up visits as told by your health care provider. This is important. SEEK MEDICAL CARE IF:  Your symptoms do not get better after 2 days of treatment.  Your symptoms get worse.  You have a fever. SEEK IMMEDIATE MEDICAL CARE IF:  You are unable to take your antibiotics or fluids.  You have shaking chills.  You vomit.  You have severe flank or back pain.  You have extreme weakness or fainting.   This information is not intended to replace advice given to you by your health care provider. Make sure you discuss any questions you have with your health care provider.   Document Released: 06/18/2005 Document Revised: 03/09/2015 Document Reviewed: 10/11/2014 Elsevier Interactive Patient Education [employer].

## 2015-12-07 NOTE — Progress Notes (Signed)
[facility] [address] [place] [address] [phone]  Date:  12/07/2015   Name:  Lynn Estrada   DOB:  1960/09/15   MRN:  [id number]  PCP:  No PCP Per Patient    History of Present Illness:  Lynn Estrada is a 55 y.o. female patient who presents to [facility] for cc of hematuria and dizziness.   --yesterday, hematuria and dizziness.  She has left sided pain, aching.  Pain has eased up.  Pain now across the lower back bilaterally.  She felt dizzy and lightheaded--she had very [employer] food, apple and oatmeal on a 10 hour shift--then had grilled cheese sandwich.  No dysuria, but heavy frequency.    No abdominal pain.   Bump on the labia within the week.  Irritated and pruritic.  No vaginal discharge.    Standing a lot with work     Patient Active Problem List   Diagnosis Date Noted  . Benign essential HTN 07/26/2015  . Eczema 07/26/2015  . BMI 28.0-28.9,adult 07/26/2015    Past Medical History  Diagnosis Date  . Hypertension   . Fibroid, uterine     Past Surgical History  Procedure Laterality Date  . Back surgery  1996    lumbar  . Partial nephrectomy Right     due to large kidney stone  . Abdominal hysterectomy      uterine fibroids; ovaries remain  . Cholecystectomy      Social History  Substance Use Topics  . Smoking status: Former Smoker -- 0.75 packs/day for 5 years    Types: Cigarettes  . Smokeless tobacco: Never Used     Comment: smoked back in her 30's  . Alcohol Use: 0.0 oz/week    0 Standard drinks or equivalent per week     Comment: social use; once a month    Family History  Problem Relation Age of Onset  . Hypertension Sister   . Eczema Sister   . Lupus Sister   . Sarcoidosis Mother   . Cancer Father     prostate  . Gout Maternal Grandmother     Allergies  Allergen Reactions  . Bactrim [Sulfamethoxazole-Trimethoprim]     rash    Medication list has been reviewed and updated.  Current Outpatient  Prescriptions on File Prior to Visit  Medication Sig Dispense Refill  . hydrOXYzine (ATARAX/VISTARIL) 25 MG tablet Take 1-3 tablets (25-75 mg total) by mouth at bedtime as needed (insomnia). 90 tablet 2  . ketoconazole (NIZORAL) 2 % cream Apply 1 application topically daily. 60 g 1  . lisinopril-hydrochlorothiazide (PRINZIDE,ZESTORETIC) 10-12.5 MG tablet Take 1 tablet by mouth daily. 90 tablet 3  . triamcinolone cream (KENALOG) 0.1 % Apply 1 application topically 2 (two) times daily.     No current facility-administered medications on file prior to visit.    ROS ROS otherwise unremarkable unless listed above.    Physical Examination: BP 159/101 mmHg  Pulse 70  Temp(Src) 98 F (36.7 C) (Oral)  Resp 16  Ht 6' (1.829 m)  Wt 212 lb (96.163 kg)  BMI 28.75 kg/m2  SpO2 99% Ideal Body Weight: Weight in (lb) to have BMI = 25: 183.9  Physical Exam  Constitutional: She is oriented to person, place, and time. She appears well-developed and well-nourished. No distress.  HENT:  Head: Normocephalic and atraumatic.  Right Ear: External ear normal.  Left Ear: External ear normal.  Eyes: Conjunctivae and EOM are normal. Pupils are equal, round,  and reactive to light.  Cardiovascular: Normal rate.   Pulmonary/Chest: Effort normal. No respiratory distress.  Abdominal: Soft. Normal appearance and bowel sounds are normal. There is tenderness in the suprapubic area. There is CVA tenderness (right).  Neurological: She is alert and oriented to person, place, and time.  Skin: She is not diaphoretic.  Feet with hyperpigmentation and peeling.  Thick bullous like lesions.  Scaling within the interdigits.    Psychiatric: She has a normal mood and affect. Her behavior is normal.   Results for orders placed or performed in visit on 12/07/15  Urine culture  Result Value Ref Range   Culture KLEBSIELLA OZAENAE    Colony Count >=100,000 COLONIES/ML    Organism ID, Bacteria KLEBSIELLA OZAENAE        Susceptibility   Klebsiella ozaenae -  (no method available)    AMPICILLIN >=32 Resistant     AMOX/CLAVULANIC <=2 Sensitive     AMPICILLIN/SULBACTAM 16 Intermediate     PIP/TAZO 8 Sensitive     IMIPENEM 1 Sensitive     CEFAZOLIN <=4 Not Reportable     CEFTRIAXONE <=1 Sensitive     CEFTAZIDIME <=1 Sensitive     CEFEPIME <=1 Sensitive     GENTAMICIN <=1 Sensitive     TOBRAMYCIN <=1 Sensitive     CIPROFLOXACIN <=0.25 Sensitive     LEVOFLOXACIN 1 Sensitive     NITROFURANTOIN 32 Sensitive     TRIMETH/SULFA* <=20 Sensitive      * NR=NOT REPORTABLE,SEE COMMENTORAL therapy:A cefazolin MIC of <32 predicts susceptibility to the oral agents cefaclor,cefdinir,cefpodoxime,cefprozil,cefuroxime,cephalexin,and loracarbef when used for therapy of uncomplicated UTIs due to E.coli,K.pneumomiae,and P.mirabilis. PARENTERAL therapy: A cefazolinMIC of >8 indicates resistance to parenteralcefazolin. An alternate test method must beperformed to confirm susceptibility to parenteralcefazolin.  COMPLETE METABOLIC PANEL WITH GFR  Result Value Ref Range   Sodium 139 135 - 146 mmol/L   Potassium 4.3 3.5 - 5.3 mmol/L   Chloride 101 98 - 110 mmol/L   CO2 27 20 - 31 mmol/L   Glucose, Bld 84 65 - 99 mg/dL   BUN 13 7 - 25 mg/dL   Creat 0.76 0.50 - 1.05 mg/dL   Total Bilirubin 0.5 0.2 - 1.2 mg/dL   Alkaline Phosphatase 55 33 - 130 U/L   AST 17 10 - 35 U/L   ALT 17 6 - 29 U/L   Total Protein 7.4 6.1 - 8.1 g/dL   Albumin 4.5 3.6 - 5.1 g/dL   Calcium 9.9 8.6 - 10.4 mg/dL   GFR, Est African American >89 >=60 mL/min   GFR, Est Non African American >89 >=60 mL/min  POCT urinalysis dipstick  Result Value Ref Range   Color, UA yellow yellow   Clarity, UA clear clear   Glucose, UA negative negative   Bilirubin, UA negative negative   Ketones, POC UA negative negative   Spec Grav, UA 1.020    Blood, UA large (A) negative   pH, UA 7.0    Protein Ur, POC =30 (A) negative   Urobilinogen, UA 0.2    Nitrite, UA Positive  (A) Negative   Leukocytes, UA moderate (2+) (A) Negative  POCT Microscopic Urinalysis ([facility])  Result Value Ref Range   WBC,UR,HPF,POC Moderate (A) None WBC/hpf   RBC,UR,HPF,POC Too numerous to count  (A) None RBC/hpf   Bacteria Moderate (A) None, Too numerous to count   Mucus Absent Absent   Epithelial Cells, UR Per Microscopy None None, Too numerous to count cells/hpf  POCT CBC  Result Value Ref Range   WBC 5.9 4.6 - 10.2 K/uL   Lymph, poc 1.8 0.6 - 3.4   POC LYMPH PERCENT 29.7 10 - 50 %L   MID (cbc) 0.4 0 - 0.9   POC MID % 6.0 0 - 12 %M   POC Granulocyte 3.8 2 - 6.9   Granulocyte percent 64.3 37 - 80 %G   RBC 4.27 4.04 - 5.48 M/uL   Hemoglobin 12.1 (A) 12.2 - 16.2 g/dL   HCT, POC 34.8 (A) 37.7 - 47.9 %   MCV 81.9 80 - 97 fL   MCH, POC 28.5 27 - 31.2 pg   MCHC 34.8 31.8 - 35.4 g/dL   RDW, POC 14.6 %   Platelet Count, POC 257 142 - 424 K/uL   MPV 8.4 0 - 99.8 fL     Assessment and Plan: Lynn Estrada is a 55 y.o. female who is here today for hematuria and dizziness. Treat for pyelonephritis at this time.  Urine culture placed.  Advised symptoms to warrant immediate return.   Hematuria - Plan: POCT urinalysis dipstick, POCT Microscopic Urinalysis ([facility]), POCT CBC, COMPLETE METABOLIC PANEL WITH GFR, ciprofloxacin (CIPRO) 500 MG tablet  Flank pain - Plan: POCT CBC, COMPLETE METABOLIC PANEL WITH GFR, ciprofloxacin (CIPRO) 500 MG tablet  Urinary frequency - Plan: Urine culture, ciprofloxacin (CIPRO) 500 MG tablet  Tinea pedis of left foot - Plan: clotrimazole-betamethasone (LOTRISONE) cream  Ivar Drape, PA-C [facility] 12/07/2015 12:07 PM

## 2015-12-08 LAB — COMPLETE METABOLIC PANEL WITH GFR
ALT: 17 U/L (ref 6–29)
AST: 17 U/L (ref 10–35)
Albumin: 4.5 g/dL (ref 3.6–5.1)
Alkaline Phosphatase: 55 U/L (ref 33–130)
BUN: 13 mg/dL (ref 7–25)
CALCIUM: 9.9 mg/dL (ref 8.6–10.4)
CO2: 27 mmol/L (ref 20–31)
CREATININE: 0.76 mg/dL (ref 0.50–1.05)
Chloride: 101 mmol/L (ref 98–110)
GFR, Est Non African American: >89 mL/min (ref >=60)
Glucose, Bld: 84 mg/dL (ref 65–99)
Potassium: 4.3 mmol/L (ref 3.5–5.3)
Sodium: 139 mmol/L (ref 135–146)
TOTAL PROTEIN: 7.4 g/dL (ref 6.1–8.1)
Total Bilirubin: 0.5 mg/dL (ref 0.2–1.2)

## 2015-12-10 LAB — URINE CULTURE: Colony Count: >=100000

## 2015-12-13 ENCOUNTER — Telehealth: Payer: Self-pay

## 2015-12-13 NOTE — Telephone Encounter (Signed)
Labs   Patient is doing fine.  Happy to know Cipro will work.  She has no other concerns at this time.

## 2016-01-10 ENCOUNTER — Ambulatory Visit (HOSPITAL_COMMUNITY)
Admission: EM | Admit: 2016-01-10 | Discharge: 2016-01-10 | Disposition: A | Discharge status: Home / Self Care | Payer: Managed Care, Other (non HMO) | Attending: Family Medicine | Admitting: Family Medicine

## 2016-01-10 ENCOUNTER — Encounter (HOSPITAL_COMMUNITY): Payer: Self-pay | Admitting: Emergency Medicine

## 2016-01-10 DIAGNOSIS — L738 Other specified follicular disorders: Secondary | ICD-10-CM | POA: Diagnosis not present

## 2016-01-10 MED ORDER — CEPHALEXIN 500 MG PO CAPS: 500.0000 mg | ORAL_CAPSULE | Freq: Three times a day (TID) | ORAL | Status: DC

## 2016-01-10 NOTE — ED Provider Notes (Signed)
CSN: [id number]     Arrival date & time 01/10/16  1909 History   First MD Initiated Contact with Patient 01/10/16 2006     Chief Complaint  Patient presents with  . Rash   (Consider location/radiation/quality/duration/timing/severity/associated sxs/prior Treatment) Patient is a 55 y.o. female presenting with rash. The history is provided by the patient.  Rash Location:  Full body Quality: draining, painful and redness   Pain details:    Severity:  Moderate   Onset quality:  Gradual   Duration:  5 days Severity:  Moderate Onset quality:  Gradual Progression:  Spreading Chronicity:  New Context: insect bite/sting   Context comment:  In [place] last week , now with scattered pustular lesions on vagina, back, chest and ext.   Past Medical History  Diagnosis Date  . Hypertension   . Fibroid, uterine    Past Surgical History  Procedure Laterality Date  . Back surgery  1996    lumbar  . Partial nephrectomy Right     due to large kidney stone  . Abdominal hysterectomy      uterine fibroids; ovaries remain  . Cholecystectomy     Family History  Problem Relation Age of Onset  . Hypertension Sister   . Eczema Sister   . Lupus Sister   . Sarcoidosis Mother   . Cancer Father     prostate  . Gout Maternal Grandmother    Social History  Substance Use Topics  . Smoking status: Former Smoker -- 0.75 packs/day for 5 years    Types: Cigarettes  . Smokeless tobacco: Never Used     Comment: smoked back in her 30's  . Alcohol Use: 0.0 oz/week    0 Standard drinks or equivalent per week     Comment: social use; once a month   OB History    No data available     Review of Systems  Constitutional: Negative.   Skin: Positive for rash.  All other systems reviewed and are negative.   Allergies  Bactrim  Home Medications   Prior to Admission medications   Medication Sig Start Date End Date Taking? Authorizing Provider  cephALEXin (KEFLEX) 500 MG capsule Take 1 capsule  (500 mg total) by mouth 3 (three) times daily. Take all of medicine and drink lots of fluids 01/10/16   Billy Fischer, MD  clotrimazole-betamethasone (LOTRISONE) cream Apply 1 application topically 2 (two) times daily. 12/07/15   Dorian Heckle English, PA  hydrOXYzine (ATARAX/VISTARIL) 25 MG tablet Take 1-3 tablets (25-75 mg total) by mouth at bedtime as needed (insomnia). 07/26/15   Chelle Jeffery, PA-C  ketoconazole (NIZORAL) 2 % cream Apply 1 application topically daily. 07/26/15   Chelle Jeffery, PA-C  lisinopril-hydrochlorothiazide (PRINZIDE,ZESTORETIC) 10-12.5 MG tablet Take 1 tablet by mouth daily. 07/26/15   Chelle Jeffery, PA-C  triamcinolone cream (KENALOG) 0.1 % Apply 1 application topically 2 (two) times daily.    Historical Provider, MD  Zinc Oxide (DESITIN) 13 % CREA Apply 1 application topically daily. 12/07/15   Joretta Bachelor, PA   Meds Ordered and Administered this Visit  Medications - No data to display  There were no vitals taken for this visit. No data found.   Physical Exam  Constitutional: She is oriented to person, place, and time. She appears well-developed and well-nourished. She appears distressed.  Neurological: She is alert and oriented to person, place, and time.  Skin: Skin is warm and dry. There is erythema.  Tender red lesions scattered as noted.  Nursing note and vitals reviewed.   ED Course  Procedures (including critical care time)  Labs Review Labs Reviewed - No data to display  Imaging Review No results found.   Visual Acuity Review  Right Eye Distance:   Left Eye Distance:   Bilateral Distance:    Right Eye Near:   Left Eye Near:    Bilateral Near:         MDM   1. Bacterial folliculitis        Billy Fischer, MD 01/10/16 2026

## 2016-01-10 NOTE — ED Notes (Addendum)
Concerned for a ? boil or insect bites? on buttocks.  Patient reports a several more have surfaced since initial site .  Areas on buttocks, chest , and back.  Areas itch

## 2016-02-24 ENCOUNTER — Telehealth: Payer: Self-pay | Admitting: Physician Assistant

## 2016-02-24 NOTE — Telephone Encounter (Signed)
Can you advise or send letter, that she is overdue her mammogram.

## 2016-02-27 NOTE — Telephone Encounter (Signed)
Letter sent.

## 2016-05-01 ENCOUNTER — Ambulatory Visit
Admission: RE | Admit: 2016-05-01 | Discharge: 2016-05-01 | Disposition: A | Discharge status: Home / Self Care | Payer: Managed Care, Other (non HMO) | Source: Ambulatory Visit | Attending: Physician Assistant | Admitting: Physician Assistant

## 2016-05-01 DIAGNOSIS — Z1231 Encounter for screening mammogram for malignant neoplasm of breast: Secondary | ICD-10-CM

## 2016-05-02 ENCOUNTER — Other Ambulatory Visit: Payer: Self-pay | Admitting: Physician Assistant

## 2016-05-02 DIAGNOSIS — N644 Mastodynia: Secondary | ICD-10-CM

## 2016-06-04 ENCOUNTER — Other Ambulatory Visit: Payer: Managed Care, Other (non HMO)

## 2016-06-18 ENCOUNTER — Other Ambulatory Visit: Payer: Managed Care, Other (non HMO)

## 2016-07-01 ENCOUNTER — Inpatient Hospital Stay (HOSPITAL_COMMUNITY)
Admission: EM | Admit: 2016-07-01 | Discharge: 2016-07-07 | DRG: 871 | Disposition: A | Discharge status: Home / Self Care | Payer: 59 | Attending: Family Medicine | Admitting: Family Medicine

## 2016-07-01 ENCOUNTER — Encounter (HOSPITAL_COMMUNITY): Payer: Self-pay | Admitting: Emergency Medicine

## 2016-07-01 DIAGNOSIS — A4159 Other Gram-negative sepsis: Principal | ICD-10-CM | POA: Diagnosis present

## 2016-07-01 DIAGNOSIS — R6889 Other general symptoms and signs: Secondary | ICD-10-CM

## 2016-07-01 DIAGNOSIS — B961 Klebsiella pneumoniae [K. pneumoniae] as the cause of diseases classified elsewhere: Secondary | ICD-10-CM | POA: Diagnosis present

## 2016-07-01 DIAGNOSIS — A419 Sepsis, unspecified organism: Secondary | ICD-10-CM | POA: Diagnosis present

## 2016-07-01 DIAGNOSIS — Z90710 Acquired absence of both cervix and uterus: Secondary | ICD-10-CM

## 2016-07-01 DIAGNOSIS — N390 Urinary tract infection, site not specified: Secondary | ICD-10-CM | POA: Diagnosis not present

## 2016-07-01 DIAGNOSIS — Z87891 Personal history of nicotine dependence: Secondary | ICD-10-CM | POA: Diagnosis not present

## 2016-07-01 DIAGNOSIS — Z832 Family history of diseases of the blood and blood-forming organs and certain disorders involving the immune mechanism: Secondary | ICD-10-CM

## 2016-07-01 DIAGNOSIS — Z7982 Long term (current) use of aspirin: Secondary | ICD-10-CM

## 2016-07-01 DIAGNOSIS — I10 Essential (primary) hypertension: Secondary | ICD-10-CM | POA: Diagnosis not present

## 2016-07-01 DIAGNOSIS — R6520 Severe sepsis without septic shock: Secondary | ICD-10-CM | POA: Diagnosis not present

## 2016-07-01 DIAGNOSIS — N179 Acute kidney failure, unspecified: Secondary | ICD-10-CM | POA: Diagnosis not present

## 2016-07-01 DIAGNOSIS — N12 Tubulo-interstitial nephritis, not specified as acute or chronic: Secondary | ICD-10-CM | POA: Diagnosis not present

## 2016-07-01 DIAGNOSIS — R05 Cough: Secondary | ICD-10-CM | POA: Diagnosis not present

## 2016-07-01 DIAGNOSIS — R269 Unspecified abnormalities of gait and mobility: Secondary | ICD-10-CM | POA: Diagnosis not present

## 2016-07-01 DIAGNOSIS — Z87442 Personal history of urinary calculi: Secondary | ICD-10-CM | POA: Diagnosis not present

## 2016-07-01 DIAGNOSIS — R059 Cough, unspecified: Secondary | ICD-10-CM

## 2016-07-01 DIAGNOSIS — J189 Pneumonia, unspecified organism: Secondary | ICD-10-CM | POA: Diagnosis not present

## 2016-07-01 DIAGNOSIS — R509 Fever, unspecified: Secondary | ICD-10-CM | POA: Diagnosis not present

## 2016-07-01 DIAGNOSIS — A689 Relapsing fever, unspecified: Secondary | ICD-10-CM

## 2016-07-01 DIAGNOSIS — Z8249 Family history of ischemic heart disease and other diseases of the circulatory system: Secondary | ICD-10-CM | POA: Diagnosis not present

## 2016-07-01 DIAGNOSIS — J101 Influenza due to other identified influenza virus with other respiratory manifestations: Secondary | ICD-10-CM | POA: Clinically undetermined

## 2016-07-01 DIAGNOSIS — E876 Hypokalemia: Secondary | ICD-10-CM | POA: Diagnosis not present

## 2016-07-01 DIAGNOSIS — E860 Dehydration: Secondary | ICD-10-CM

## 2016-07-01 LAB — CBC
HEMATOCRIT: 35.6 % — AB (ref 36.0–46.0)
HEMOGLOBIN: 11.9 g/dL — AB (ref 12.0–15.0)
MCH: 27.7 pg (ref 26.0–34.0)
MCHC: 33.4 g/dL (ref 30.0–36.0)
MCV: 82.8 fL (ref 78.0–100.0)
Platelets: 251 10*3/uL (ref 150–400)
RBC: 4.3 MIL/uL (ref 3.87–5.11)
RDW: 14.1 % (ref 11.5–15.5)
WBC: 8.4 10*3/uL (ref 4.0–10.5)

## 2016-07-01 LAB — COMPREHENSIVE METABOLIC PANEL
ALT: 39 U/L (ref 14–54)
AST: 49 U/L — ABNORMAL HIGH (ref 15–41)
Albumin: 4.3 g/dL (ref 3.5–5.0)
Alkaline Phosphatase: 58 U/L (ref 38–126)
Anion gap: 10 (ref 5–15)
BUN: 25 mg/dL — ABNORMAL HIGH (ref 6–20)
CHLORIDE: 102 mmol/L (ref 101–111)
CO2: 26 mmol/L (ref 22–32)
Calcium: 9.4 mg/dL (ref 8.9–10.3)
Creatinine, Ser: 1.32 mg/dL — ABNORMAL HIGH (ref 0.44–1.00)
GFR, EST AFRICAN AMERICAN: 52 mL/min — AB (ref >60)
GFR, EST NON AFRICAN AMERICAN: 45 mL/min — AB (ref >60)
Glucose, Bld: 116 mg/dL — ABNORMAL HIGH (ref 65–99)
POTASSIUM: 3.2 mmol/L — AB (ref 3.5–5.1)
SODIUM: 138 mmol/L (ref 135–145)
Total Bilirubin: 1.1 mg/dL (ref 0.3–1.2)
Total Protein: 7.8 g/dL (ref 6.5–8.1)

## 2016-07-01 LAB — LIPASE, BLOOD: LIPASE: 41 U/L (ref 11–51)

## 2016-07-01 MED ORDER — ACETAMINOPHEN 325 MG PO TABS
650.0000 mg | ORAL_TABLET | Freq: Once | ORAL | Status: AC | PRN
  Administered 2016-07-01: 650 mg via ORAL
  Filled 2016-07-01: qty 2

## 2016-07-01 MED ORDER — ONDANSETRON 4 MG PO TBDP
4.0000 mg | ORAL_TABLET | Freq: Once | ORAL | Status: AC | PRN
  Administered 2016-07-01: 4 mg via ORAL
  Filled 2016-07-01: qty 1

## 2016-07-01 NOTE — ED Triage Notes (Signed)
Pt states she has had N/V with lower abdominal pain x 3 days. States she has also been itching. Alert and oriented.

## 2016-07-02 ENCOUNTER — Inpatient Hospital Stay (HOSPITAL_COMMUNITY): Payer: 59

## 2016-07-02 ENCOUNTER — Emergency Department (HOSPITAL_COMMUNITY): Payer: 59

## 2016-07-02 DIAGNOSIS — Z8701 Personal history of pneumonia (recurrent): Secondary | ICD-10-CM

## 2016-07-02 DIAGNOSIS — E860 Dehydration: Secondary | ICD-10-CM | POA: Diagnosis not present

## 2016-07-02 DIAGNOSIS — N179 Acute kidney failure, unspecified: Secondary | ICD-10-CM | POA: Diagnosis present

## 2016-07-02 DIAGNOSIS — E876 Hypokalemia: Secondary | ICD-10-CM | POA: Diagnosis not present

## 2016-07-02 DIAGNOSIS — R509 Fever, unspecified: Secondary | ICD-10-CM | POA: Diagnosis not present

## 2016-07-02 DIAGNOSIS — A419 Sepsis, unspecified organism: Secondary | ICD-10-CM

## 2016-07-02 DIAGNOSIS — R6520 Severe sepsis without septic shock: Secondary | ICD-10-CM | POA: Diagnosis not present

## 2016-07-02 DIAGNOSIS — R269 Unspecified abnormalities of gait and mobility: Secondary | ICD-10-CM | POA: Diagnosis not present

## 2016-07-02 DIAGNOSIS — A4159 Other Gram-negative sepsis: Secondary | ICD-10-CM | POA: Diagnosis not present

## 2016-07-02 DIAGNOSIS — Z87442 Personal history of urinary calculi: Secondary | ICD-10-CM | POA: Diagnosis not present

## 2016-07-02 DIAGNOSIS — N390 Urinary tract infection, site not specified: Secondary | ICD-10-CM

## 2016-07-02 DIAGNOSIS — Z7982 Long term (current) use of aspirin: Secondary | ICD-10-CM | POA: Diagnosis not present

## 2016-07-02 DIAGNOSIS — I10 Essential (primary) hypertension: Secondary | ICD-10-CM | POA: Diagnosis not present

## 2016-07-02 DIAGNOSIS — R05 Cough: Secondary | ICD-10-CM | POA: Diagnosis not present

## 2016-07-02 DIAGNOSIS — Z832 Family history of diseases of the blood and blood-forming organs and certain disorders involving the immune mechanism: Secondary | ICD-10-CM | POA: Diagnosis not present

## 2016-07-02 DIAGNOSIS — Z8249 Family history of ischemic heart disease and other diseases of the circulatory system: Secondary | ICD-10-CM | POA: Diagnosis not present

## 2016-07-02 DIAGNOSIS — J101 Influenza due to other identified influenza virus with other respiratory manifestations: Secondary | ICD-10-CM | POA: Diagnosis not present

## 2016-07-02 DIAGNOSIS — J189 Pneumonia, unspecified organism: Secondary | ICD-10-CM | POA: Diagnosis not present

## 2016-07-02 DIAGNOSIS — Z90710 Acquired absence of both cervix and uterus: Secondary | ICD-10-CM | POA: Diagnosis not present

## 2016-07-02 DIAGNOSIS — N12 Tubulo-interstitial nephritis, not specified as acute or chronic: Secondary | ICD-10-CM | POA: Diagnosis not present

## 2016-07-02 DIAGNOSIS — Z87891 Personal history of nicotine dependence: Secondary | ICD-10-CM | POA: Diagnosis not present

## 2016-07-02 DIAGNOSIS — B961 Klebsiella pneumoniae [K. pneumoniae] as the cause of diseases classified elsewhere: Secondary | ICD-10-CM | POA: Diagnosis not present

## 2016-07-02 HISTORY — DX: Personal history of pneumonia (recurrent): Z87.01

## 2016-07-02 LAB — CBC WITH DIFFERENTIAL/PLATELET
Basophils Absolute: 0 10*3/uL (ref 0.0–0.1)
Basophils Relative: 0 %
Eosinophils Absolute: 0 10*3/uL (ref 0.0–0.7)
Eosinophils Relative: 0 %
HEMATOCRIT: 32.3 % — AB (ref 36.0–46.0)
Hemoglobin: 10.6 g/dL — ABNORMAL LOW (ref 12.0–15.0)
LYMPHS ABS: 0.6 10*3/uL — AB (ref 0.7–4.0)
LYMPHS PCT: 6 %
MCH: 27.9 pg (ref 26.0–34.0)
MCHC: 32.8 g/dL (ref 30.0–36.0)
MCV: 85 fL (ref 78.0–100.0)
MONO ABS: 1 10*3/uL (ref 0.1–1.0)
MONOS PCT: 11 %
NEUTROS ABS: 7.5 10*3/uL (ref 1.7–7.7)
Neutrophils Relative %: 83 %
PLATELETS: 204 10*3/uL (ref 150–400)
RBC: 3.8 MIL/uL — ABNORMAL LOW (ref 3.87–5.11)
RDW: 14.4 % (ref 11.5–15.5)
WBC: 9.1 10*3/uL (ref 4.0–10.5)

## 2016-07-02 LAB — LACTIC ACID, PLASMA
LACTIC ACID, VENOUS: 1 mmol/L (ref 0.5–1.9)
Lactic Acid, Venous: 0.7 mmol/L (ref 0.5–1.9)

## 2016-07-02 LAB — URINALYSIS, ROUTINE W REFLEX MICROSCOPIC
Bilirubin Urine: NEGATIVE
Glucose, UA: NEGATIVE mg/dL
KETONES UR: 5 mg/dL — AB
Nitrite: POSITIVE — AB
Protein, ur: 100 mg/dL — AB
Specific Gravity, Urine: 1.016 (ref 1.005–1.030)
pH: 5 (ref 5.0–8.0)

## 2016-07-02 LAB — PROTIME-INR
INR: 1.27
Prothrombin Time: 16 seconds — ABNORMAL HIGH (ref 11.4–15.2)

## 2016-07-02 LAB — COMPREHENSIVE METABOLIC PANEL
ALBUMIN: 3.6 g/dL (ref 3.5–5.0)
ALT: 44 U/L (ref 14–54)
AST: 44 U/L — AB (ref 15–41)
Alkaline Phosphatase: 53 U/L (ref 38–126)
Anion gap: 9 (ref 5–15)
BUN: 24 mg/dL — AB (ref 6–20)
CHLORIDE: 101 mmol/L (ref 101–111)
CO2: 24 mmol/L (ref 22–32)
CREATININE: 1.28 mg/dL — AB (ref 0.44–1.00)
Calcium: 8.2 mg/dL — ABNORMAL LOW (ref 8.9–10.3)
GFR calc non Af Amer: 47 mL/min — ABNORMAL LOW (ref >60)
GFR, EST AFRICAN AMERICAN: 54 mL/min — AB (ref >60)
GLUCOSE: 107 mg/dL — AB (ref 65–99)
Potassium: 3.3 mmol/L — ABNORMAL LOW (ref 3.5–5.1)
SODIUM: 134 mmol/L — AB (ref 135–145)
Total Bilirubin: 1.1 mg/dL (ref 0.3–1.2)
Total Protein: 6.8 g/dL (ref 6.5–8.1)

## 2016-07-02 LAB — APTT: APTT: 36 s (ref 24–36)

## 2016-07-02 LAB — I-STAT CG4 LACTIC ACID, ED: LACTIC ACID, VENOUS: 0.61 mmol/L (ref 0.5–1.9)

## 2016-07-02 LAB — PROCALCITONIN: Procalcitonin: 0.76 ng/mL

## 2016-07-02 MED ORDER — ASPIRIN EC 81 MG PO TBEC
81.0000 mg | DELAYED_RELEASE_TABLET | Freq: Every day | ORAL | Status: DC
  Administered 2016-07-02 – 2016-07-07 (×6): 81 mg via ORAL
  Filled 2016-07-02 (×6): qty 1

## 2016-07-02 MED ORDER — DEXTROSE 5 % IV SOLN: 2.0000 g | Freq: Once | INTRAVENOUS | Status: DC

## 2016-07-02 MED ORDER — ACETAMINOPHEN 325 MG PO TABS
650.0000 mg | ORAL_TABLET | Freq: Once | ORAL | Status: AC
  Administered 2016-07-02: 650 mg via ORAL
  Filled 2016-07-02: qty 2

## 2016-07-02 MED ORDER — SODIUM CHLORIDE 0.9 % IV SOLN
INTRAVENOUS | Status: DC
  Administered 2016-07-02 – 2016-07-07 (×7): via INTRAVENOUS

## 2016-07-02 MED ORDER — ACETAMINOPHEN 325 MG PO TABS
650.0000 mg | ORAL_TABLET | Freq: Four times a day (QID) | ORAL | Status: DC | PRN
  Administered 2016-07-06: 650 mg via ORAL
  Filled 2016-07-02 (×2): qty 2

## 2016-07-02 MED ORDER — ACETAMINOPHEN 325 MG PO TABS
975.0000 mg | ORAL_TABLET | Freq: Once | ORAL | Status: AC
  Administered 2016-07-02: 975 mg via ORAL
  Filled 2016-07-02: qty 3

## 2016-07-02 MED ORDER — ONDANSETRON HCL 4 MG/2ML IJ SOLN
4.0000 mg | Freq: Four times a day (QID) | INTRAMUSCULAR | Status: DC | PRN
  Administered 2016-07-02: 4 mg via INTRAVENOUS
  Filled 2016-07-02: qty 2

## 2016-07-02 MED ORDER — ONDANSETRON HCL 4 MG/2ML IJ SOLN
4.0000 mg | Freq: Once | INTRAMUSCULAR | Status: AC
  Administered 2016-07-02: 4 mg via INTRAVENOUS
  Filled 2016-07-02: qty 2

## 2016-07-02 MED ORDER — PROMETHAZINE HCL 25 MG/ML IJ SOLN
12.5000 mg | Freq: Four times a day (QID) | INTRAMUSCULAR | Status: DC | PRN
  Administered 2016-07-02 – 2016-07-05 (×7): 12.5 mg via INTRAVENOUS
  Filled 2016-07-02 (×7): qty 1

## 2016-07-02 MED ORDER — ACETAMINOPHEN 650 MG RE SUPP
650.0000 mg | Freq: Four times a day (QID) | RECTAL | Status: DC | PRN
  Administered 2016-07-02 – 2016-07-06 (×7): 650 mg via RECTAL
  Filled 2016-07-02 (×7): qty 1

## 2016-07-02 MED ORDER — SODIUM CHLORIDE 0.9 % IV BOLUS (SEPSIS)
2000.0000 mL | Freq: Once | INTRAVENOUS | Status: AC
  Administered 2016-07-02: 2000 mL via INTRAVENOUS

## 2016-07-02 MED ORDER — SODIUM CHLORIDE 0.9 % IV BOLUS (SEPSIS): 2000.0000 mL | Freq: Once | INTRAVENOUS | Status: DC

## 2016-07-02 MED ORDER — DEXTROSE 5 % IV SOLN
1.0000 g | Freq: Once | INTRAVENOUS | Status: AC
  Administered 2016-07-02: 1 g via INTRAVENOUS
  Filled 2016-07-02: qty 10

## 2016-07-02 MED ORDER — POTASSIUM CHLORIDE CRYS ER 20 MEQ PO TBCR
40.0000 meq | EXTENDED_RELEASE_TABLET | Freq: Once | ORAL | Status: AC
  Administered 2016-07-02: 40 meq via ORAL
  Filled 2016-07-02: qty 2

## 2016-07-02 MED ORDER — ONDANSETRON HCL 4 MG PO TABS: 4.0000 mg | ORAL_TABLET | Freq: Four times a day (QID) | ORAL | Status: DC | PRN

## 2016-07-02 MED ORDER — ENOXAPARIN SODIUM 40 MG/0.4ML ~~LOC~~ SOLN
40.0000 mg | SUBCUTANEOUS | Status: DC
  Administered 2016-07-02 – 2016-07-05 (×4): 40 mg via SUBCUTANEOUS
  Filled 2016-07-02 (×4): qty 0.4

## 2016-07-02 MED ORDER — DEXTROSE 5 % IV SOLN
1.0000 g | INTRAVENOUS | Status: DC
  Administered 2016-07-03 – 2016-07-07 (×5): 1 g via INTRAVENOUS
  Filled 2016-07-02 (×5): qty 10

## 2016-07-02 MED ORDER — TRAMADOL HCL 50 MG PO TABS: 50.0000 mg | ORAL_TABLET | Freq: Four times a day (QID) | ORAL | Status: DC | PRN

## 2016-07-02 MED ORDER — ENSURE ENLIVE PO LIQD
237.0000 mL | Freq: Two times a day (BID) | ORAL | Status: DC
  Administered 2016-07-03: 237 mL via ORAL

## 2016-07-02 MED ORDER — HYDRALAZINE HCL 20 MG/ML IJ SOLN: 10.0000 mg | Freq: Four times a day (QID) | INTRAMUSCULAR | Status: DC | PRN

## 2016-07-02 MED ORDER — MORPHINE SULFATE (PF) 2 MG/ML IV SOLN
1.0000 mg | INTRAVENOUS | Status: DC | PRN
  Administered 2016-07-02: 1 mg via INTRAVENOUS
  Filled 2016-07-02: qty 1

## 2016-07-02 MED ORDER — KETOROLAC TROMETHAMINE 15 MG/ML IJ SOLN
15.0000 mg | Freq: Once | INTRAMUSCULAR | Status: AC
  Administered 2016-07-02: 15 mg via INTRAVENOUS
  Filled 2016-07-02: qty 1

## 2016-07-02 NOTE — ED Notes (Signed)
Dr. Kathrynn Humble into room, pt alert, NAD, calm, interactive, resps e/u, speaking in clear complete sentences, (denies: pain, sob, nausea or dizziness), "feel better". Family at [facility]. VSS.

## 2016-07-02 NOTE — H&P (Signed)
[facility] History and Physical  Lynn Estrada [id number] DOB: 05-24-1961 DOA: 07/01/2016  PCP: No PCP Per Patient  Patient coming from: home  Chief Complaint: Abdominal pain  HPI: Lynn Estrada is a 56 y.o. female with a medical history of hypertension, presented to the emergency department with complaints of abdominal pain, fever, chills, or last several days. Abdominal pain has been waxing and waning. She did have some nausea with vomiting in the emergency department. Patient also complains of dizziness, dry mouth, and productive cough. She denies any sick contacts or recent travel. Patient did receive her flu acceleration this year. Currently she denies any chest pain, shortness of breath, diarrhea or constipation. Does complain of mild headache.   ED Course: Found to have urinary tract infection, started on ceftriaxone and given 2 L of IV fluid. [facility] called for admission.  Review of Systems:  All other systems reviewed and are negative.   Past Medical History:  Diagnosis Date  . Fibroid, uterine   . Hypertension     Past Surgical History:  Procedure Laterality Date  . ABDOMINAL HYSTERECTOMY     uterine fibroids; ovaries remain  . BACK SURGERY  1996   lumbar  . CHOLECYSTECTOMY    . PARTIAL NEPHRECTOMY Right    due to large kidney stone    Social History:  reports that she has quit smoking. Her smoking use included Cigarettes. She has a 3.75 pack-year smoking history. She has never used smokeless tobacco. She reports that she drinks alcohol. She reports that she does not use drugs.  Allergies  Allergen Reactions  . Bactrim [Sulfamethoxazole-Trimethoprim]     rash    Family History  Problem Relation Age of Onset  . Hypertension Sister   . Eczema Sister   . Lupus Sister   . Sarcoidosis Mother   . Cancer Father     prostate  . Gout Maternal Grandmother     Prior to Admission medications   Medication Sig Start Date End Date Taking? Authorizing  Provider  aspirin EC 81 MG tablet Take 81 mg by mouth daily.   Yes Historical Provider, MD  lisinopril-hydrochlorothiazide (PRINZIDE,ZESTORETIC) 10-12.5 MG tablet Take 1 tablet by mouth daily. 07/26/15  Yes Chelle Jeffery, PA-C  Multiple Vitamins-Minerals (MULTIVITAMIN ADULT PO) Take 1 tablet by mouth daily.   Yes Historical Provider, MD    Physical Exam: Vitals:   07/02/16 0815 07/02/16 0845  BP: 130/78   Pulse: 107   Resp: 16   Temp:  100.6 F (38.1 C)     General: Well developed, well nourished, mild distress, appears stated age  HEENT: NCAT, PERRLA, EOMI, Anicteic Sclera, mucous membranes moist.   Neck: Supple, no JVD, no masses  Cardiovascular: S1 S2 auscultated, no rubs, murmurs or gallops. Tachycardic  Respiratory: Clear to auscultation bilaterally with equal chest rise  Abdomen: Soft, obese, nontender, nondistended, + bowel sounds  Extremities: warm dry without cyanosis clubbing or edema  Neuro: AAOx3, cranial nerves grossly intact. Strength 5/5 in patient's upper and lower extremities bilaterally  Skin: Without rashes exudates or nodules  Psych: Normal affect and demeanor with intact judgement and insight  Labs on Admission: I have personally reviewed following labs and imaging studies CBC:  Recent Labs Lab 07/01/16 2102  WBC 8.4  HGB 11.9*  HCT 35.6*  MCV 82.8  PLT 123XX123   Basic Metabolic Panel:  Recent Labs Lab 07/01/16 2102  NA 138  K 3.2*  CL 102  CO2 26  GLUCOSE 116*  BUN 25*  CREATININE 1.32*  CALCIUM 9.4   GFR: CrCl cannot be calculated (Unknown ideal weight.). Liver Function Tests:  Recent Labs Lab 07/01/16 2102  AST 49*  ALT 39  ALKPHOS 58  BILITOT 1.1  PROT 7.8  ALBUMIN 4.3    Recent Labs Lab 07/01/16 2102  LIPASE 41   No results for input(s): AMMONIA in the last 168 hours. Coagulation Profile: No results for input(s): INR, PROTIME in the last 168 hours. Cardiac Enzymes: No results for input(s): CKTOTAL, CKMB,  CKMBINDEX, TROPONINI in the last 168 hours. BNP (last 3 results) No results for input(s): PROBNP in the last 8760 hours. HbA1C: No results for input(s): HGBA1C in the last 72 hours. CBG: No results for input(s): GLUCAP in the last 168 hours. Lipid Profile: No results for input(s): CHOL, HDL, LDLCALC, TRIG, CHOLHDL, LDLDIRECT in the last 72 hours. Thyroid Function Tests: No results for input(s): TSH, T4TOTAL, FREET4, T3FREE, THYROIDAB in the last 72 hours. Anemia Panel: No results for input(s): VITAMINB12, FOLATE, FERRITIN, TIBC, IRON, RETICCTPCT in the last 72 hours. Urine analysis:    Component Value Date/Time   COLORURINE AMBER (A) 07/01/2016 0144   APPEARANCEUR CLOUDY (A) 07/01/2016 0144   LABSPEC 1.016 07/01/2016 0144   PHURINE 5.0 07/01/2016 0144   GLUCOSEU NEGATIVE 07/01/2016 0144   HGBUR MODERATE (A) 07/01/2016 0144   BILIRUBINUR NEGATIVE 07/01/2016 0144   BILIRUBINUR negative 12/07/2015 1226   KETONESUR 5 (A) 07/01/2016 0144   PROTEINUR 100 (A) 07/01/2016 0144   UROBILINOGEN 0.2 12/07/2015 1226   NITRITE POSITIVE (A) 07/01/2016 0144   LEUKOCYTESUR LARGE (A) 07/01/2016 0144   Sepsis Labs: @LABRCNTIP (procalcitonin:4,lacticidven:4) )No results found for this or any previous visit (from the past 240 hour(s)).   Radiological Exams on Admission: Dg Chest 2 View  Result Date: 07/02/2016 CLINICAL DATA:  Worsening n/v since Wednesday, fever, generalized body aches, nonsmoker, no other chest complaints EXAM: CHEST  2 VIEW COMPARISON:  None. FINDINGS: Cardiac silhouette is mildly enlarged. No mediastinal or hilar masses. No evidence of adenopathy mild linear opacity in the lung bases is likely atelectasis. Lungs are otherwise clear. No pleural effusion. No pneumothorax. Skeletal structures are unremarkable. IMPRESSION: No acute cardiopulmonary disease. Electronically Signed   By: Lajean Manes M.D.   On: 07/02/2016 08:15    EKG: None  Assessment/Plan Severe sepsis secondary to  UTI -Presented with fever, tachycardia, tachypnea with. Found to have acute kidney injury as well. -Sepsis order set utilized -UA showed TNTC WBC, may bacteria, large leukocytes, positive nitrites -Urine and blood cultures pending -Started on ceftriaxone, IVF, pain control  Acute kidney injury -Based and creatinine possibly 0.7-0.8, upon admission 1.32 -Likely secondary to dehydration and sepsis -Placed on IV fluids, continue to monitor BMP -Lisinopril/HCTZ held  Nausea, vomiting, generalized pain -Likely secondary to sepsis with urinary tract infection -Continue antiemetics and pain control as needed  Essential hypertension -Lisinopril/HCTZ held secondary to AKI -Placed on hydralazine PRN  Hypokalemia -Possibly secondary to GI losses versus medications -Replacing continued monitor BMP   DVT prophylaxis: Lovenox  Code Status: Full  Family Communication: Husband at bedside. Admission, patients condition and plan of care including tests being ordered have been discussed with the patient and husband who indicate understanding and agree with the plan and Code Status.  Disposition Plan: Home when stable   Consults called: None   Admission status: Inpatient   Time spent: 70 minutes  Tab Rylee D.O. [facility] Pager [phone]  If 7PM-7AM, please contact night-coverage [email or web address] Password [email or web address]  07/02/2016, 8:49 AM

## 2016-07-02 NOTE — ED Notes (Signed)
Pt sts she is unable to give a urine sample at this time 

## 2016-07-02 NOTE — ED Provider Notes (Signed)
[facility] DEPT Provider Note   CSN: [id number] Arrival date & time: 07/01/16  2042  By signing my name below, I, [name], attest that this documentation has been prepared under the direction and in the presence of Varney Biles, MD. Electronically Signed: [name], Scribe. 07/02/16. 12:22 AM.    History   Chief Complaint Chief Complaint  Patient presents with  . Abdominal Pain    HPI Lynn Estrada is a 56 y.o. female with h/o hypertension who presents to the Emergency Department complaining of gradual onset waxing and waning abdominal pain that began 3 days ago. Pt has associated subjective fever, dry mouth, chills, dizziness, productive cough with yellow sputum, nausea, and headache. Patient has vomited twice. The patient has had positive sick contact at work. She has had a flu shot this season.  Hx of fibroids and hysterectomy. No new discharge. Reports frequent urination. Hx of kidney infections and stones. Has had 1/2 of kidney removed.  The history is provided by the patient. No language interpreter was used.    Past Medical History:  Diagnosis Date  . Fibroid, uterine   . Hypertension     Patient Active Problem List   Diagnosis Date Noted  . Benign essential HTN 07/26/2015  . Eczema 07/26/2015  . BMI 28.0-28.9,adult 07/26/2015    Past Surgical History:  Procedure Laterality Date  . ABDOMINAL HYSTERECTOMY     uterine fibroids; ovaries remain  . BACK SURGERY  1996   lumbar  . CHOLECYSTECTOMY    . PARTIAL NEPHRECTOMY Right    due to large kidney stone    OB History    No data available       Home Medications    Prior to Admission medications   Medication Sig Start Date End Date Taking? Authorizing Provider  aspirin EC 81 MG tablet Take 81 mg by mouth daily.   Yes Historical Provider, MD  lisinopril-hydrochlorothiazide (PRINZIDE,ZESTORETIC) 10-12.5 MG tablet Take 1 tablet by mouth daily. 07/26/15  Yes Chelle Jeffery, PA-C  Multiple  Vitamins-Minerals (MULTIVITAMIN ADULT PO) Take 1 tablet by mouth daily.   Yes Historical Provider, MD  cephALEXin (KEFLEX) 500 MG capsule Take 1 capsule (500 mg total) by mouth 3 (three) times daily. Take all of medicine and drink lots of fluids Patient not taking: Reported on 07/01/2016 01/10/16   Billy Fischer, MD  clotrimazole-betamethasone (LOTRISONE) cream Apply 1 application topically 2 (two) times daily. Patient not taking: Reported on 07/01/2016 12/07/15   Dorian Heckle English, PA  hydrOXYzine (ATARAX/VISTARIL) 25 MG tablet Take 1-3 tablets (25-75 mg total) by mouth at bedtime as needed (insomnia). Patient not taking: Reported on 07/01/2016 07/26/15   [name], PA-C  ketoconazole (NIZORAL) 2 % cream Apply 1 application topically daily. Patient not taking: Reported on 07/01/2016 07/26/15   [name], PA-C  Zinc Oxide (DESITIN) 13 % CREA Apply 1 application topically daily. Patient not taking: Reported on 07/01/2016 12/07/15   Joretta Bachelor, PA    Family History Family History  Problem Relation Age of Onset  . Hypertension Sister   . Eczema Sister   . Lupus Sister   . Sarcoidosis Mother   . Cancer Father     prostate  . Gout Maternal Grandmother     Social History Social History  Substance Use Topics  . Smoking status: Former Smoker    Packs/day: 0.75    Years: 5.00    Types: Cigarettes  . Smokeless tobacco: Never Used     Comment: smoked  back in her 30's  . Alcohol use 0.0 oz/week     Comment: social use; once a month     Allergies   Bactrim [sulfamethoxazole-trimethoprim]   Review of Systems Review of Systems  Constitutional: Positive for chills and fever.  HENT: Positive for congestion.   Respiratory: Negative for cough and shortness of breath.   Gastrointestinal: Positive for abdominal pain, nausea and vomiting.  Neurological: Positive for dizziness.   A complete 10 system review of systems was obtained and all systems are negative except as noted  in the HPI and PMH.    Physical Exam Updated Vital Signs BP 145/99   Pulse (!) 121   Temp 101.1 F (38.4 C) (Oral)   Resp 22   SpO2 97%   Physical Exam  Constitutional: She is oriented to person, place, and time. She appears well-developed and well-nourished.  HENT:  Head: Normocephalic and atraumatic.  Mucus membranes dry.  Cardiovascular: Normal rate.   Tachycardia.  Pulmonary/Chest: Effort normal and breath sounds normal.  Abdominal: Soft. Bowel sounds are normal. She exhibits no distension. There is tenderness. There is no rebound and no guarding.  Neurological: She is alert and oriented to person, place, and time.  Skin: Skin is warm and dry.  Psychiatric: She has a normal mood and affect.  Nursing note and vitals reviewed.    ED Treatments / Results   DIAGNOSTIC STUDIES: Oxygen Saturation is 96% on room air, adequate by my interpretation.    COORDINATION OF CARE: 12:52 AM Discussed treatment plan with pt at bedside and pt agreed to plan.   Labs (all labs ordered are listed, but only abnormal results are displayed) Labs Reviewed  COMPREHENSIVE METABOLIC PANEL - Abnormal; Notable for the following:       Result Value   Potassium 3.2 (*)    Glucose, Bld 116 (*)    BUN 25 (*)    Creatinine, Ser 1.32 (*)    AST 49 (*)    GFR calc non Af Amer 45 (*)    GFR calc Af Amer 52 (*)    All other components within normal limits  CBC - Abnormal; Notable for the following:    Hemoglobin 11.9 (*)    HCT 35.6 (*)    All other components within normal limits  URINALYSIS, ROUTINE W REFLEX MICROSCOPIC - Abnormal; Notable for the following:    Color, Urine AMBER (*)    APPearance CLOUDY (*)    Hgb urine dipstick MODERATE (*)    Ketones, ur 5 (*)    Protein, ur 100 (*)    Nitrite POSITIVE (*)    Leukocytes, UA LARGE (*)    Bacteria, UA MANY (*)    Squamous Epithelial / LPF 6-30 (*)    All other components within normal limits  URINE CULTURE  LIPASE, BLOOD  I-STAT CG4  LACTIC ACID, ED    EKG  EKG Interpretation None       Radiology No results found.  Procedures Procedures (including critical care time)  Medications Ordered in ED Medications  acetaminophen (TYLENOL) tablet 650 mg (650 mg Oral Given 07/01/16 2055)  ondansetron (ZOFRAN-ODT) disintegrating tablet 4 mg (4 mg Oral Given 07/01/16 2055)  sodium chloride 0.9 % bolus 2,000 mL (0 mLs Intravenous Stopped 07/02/16 0402)  ondansetron (ZOFRAN) injection 4 mg (4 mg Intravenous Given 07/02/16 0153)  acetaminophen (TYLENOL) tablet 650 mg (650 mg Oral Given 07/02/16 0205)  ketorolac (TORADOL) 15 MG/ML injection 15 mg (15 mg Intravenous Given 07/02/16 0206)  potassium chloride SA (K-DUR,KLOR-CON) CR tablet 40 mEq (40 mEq Oral Given 07/02/16 0206)  cefTRIAXone (ROCEPHIN) 1 g in dextrose 5 % 50 mL IVPB (0 g Intravenous Stopped 07/02/16 0532)  ondansetron (ZOFRAN) injection 4 mg (4 mg Intravenous Given 07/02/16 0636)  acetaminophen (TYLENOL) tablet 975 mg (975 mg Oral Given 07/02/16 [id number])     Initial Impression / Assessment and Plan / ED Course  I have reviewed the triage vital signs and the nursing notes.  Pertinent labs & imaging results that were available during my care of the patient were reviewed by me and considered in my medical decision making (see chart for details).  Clinical Course as of Jul 02 732  Mon Jul 02, 2016  0158 Pt has fever, tachycardia. She appears dry. Abd pan is generalized, not focal. We discussed aggressive option vs. Conservative option - and pt has opted for conservative option for now. So we will not be getting CT, but if patient gets worse, she will return to the ER and we will reassess her at that time.   [AN]  0417 Has UTI - we will treat as pyelonephritis. PO challenge already passed. Results from the ER workup discussed with the patient face to face and all questions answered to the best of my ability. Strict ER return precautions have been discussed, and patient is  agreeing with the plan and is comfortable with the workup done and the recommendations from the ER.   [AN]  0719 Pt started having vomiting at time of discharge. We gave her zofran odt, and she still feels nauseated. We will admit for pyelonephritis.  [AN]  0732 BECAUSE WE WERE ANTICIPATING DISCHARGE - WE NEVER GOT LACTATE OR BLOOD CULTURES. Pt is now s/p ceftriaxone, so we will get urine culture, but not the blood cultures. Lactate ordered as well.  [AN]    Clinical Course User Index [AN] Varney Biles, MD    Pt comes in with cc of cough, aches, malaise, abd pain, nausea. Noted to have fevers, tachycardia and also she appears dry. Abd exam is nonfocal.   Final Clinical Impressions(s) / ED Diagnoses   Final diagnoses:  Pyelonephritis    New Prescriptions New Prescriptions   No medications on file   I personally performed the services described in this documentation, which was scribed in my presence. The recorded information has been reviewed and is accurate.     Varney Biles, MD 07/02/16 [id number]

## 2016-07-02 NOTE — Progress Notes (Signed)
Temp 103.1. Tylenol given. MD aware

## 2016-07-02 NOTE — Progress Notes (Addendum)
Nausea unrelieved by zofran. Patient continues to feel nauseous and vomits yellow emesis. Paged MD. Continue to monitor. New order place for nausea med. & administered.

## 2016-07-02 NOTE — ED Notes (Signed)
Up to b/r, vomiting.

## 2016-07-02 NOTE — ED Notes (Signed)
Pt urinated on bed 2x during emesis. Linens and bedpad changed both times.Marland Kitchen

## 2016-07-02 NOTE — ED Notes (Signed)
Back from b/r by w/c, self transfers, returned to monitor, VSS, continues to vomit, c/o chills, rigors present, fever noted. HR 124. Will continue to monitor. EDP aware

## 2016-07-02 NOTE — ED Notes (Signed)
Denies pain or nausea, resting/sleeping, NAD, calm, interactive, no changes, VSS.

## 2016-07-02 NOTE — ED Notes (Signed)
Pt continues to vomit. Thinks she threw up the tylenol.

## 2016-07-03 LAB — BASIC METABOLIC PANEL
ANION GAP: 9 (ref 5–15)
BUN: 23 mg/dL — ABNORMAL HIGH (ref 6–20)
CHLORIDE: 108 mmol/L (ref 101–111)
CO2: 24 mmol/L (ref 22–32)
Calcium: 8.2 mg/dL — ABNORMAL LOW (ref 8.9–10.3)
Creatinine, Ser: 1.28 mg/dL — ABNORMAL HIGH (ref 0.44–1.00)
GFR calc non Af Amer: 47 mL/min — ABNORMAL LOW (ref >60)
GFR, EST AFRICAN AMERICAN: 54 mL/min — AB (ref >60)
Glucose, Bld: 111 mg/dL — ABNORMAL HIGH (ref 65–99)
POTASSIUM: 3.5 mmol/L (ref 3.5–5.1)
Sodium: 141 mmol/L (ref 135–145)

## 2016-07-03 MED ORDER — PRO-STAT SUGAR FREE PO LIQD
30.0000 mL | Freq: Two times a day (BID) | ORAL | Status: DC
  Administered 2016-07-03: 30 mL via ORAL
  Filled 2016-07-03: qty 30

## 2016-07-03 MED ORDER — PREMIER PROTEIN SHAKE
11.0000 [oz_av] | ORAL | Status: DC
  Administered 2016-07-04: 11 [oz_av] via ORAL
  Filled 2016-07-03: qty 325.31

## 2016-07-03 NOTE — Progress Notes (Signed)
Initial Nutrition Assessment  INTERVENTION:   Need weight for this admission (last recorded in June 2017).  Provide Prostat liquid protein PO 30 ml BID with meals, each supplement provides 100 kcal, 15 grams protein. Provide trial of Premier Protein supplement once daily, each provides 160 kcal and 30g protein.  RD to continue to monitor  NUTRITION DIAGNOSIS:   Inadequate oral intake related to nausea, vomiting, poor appetite as evidenced by per patient/family report.  GOAL:   Patient will meet greater than or equal to 90% of their needs  MONITOR:   PO intake, Supplement acceptance, Labs, Weight trends, I & O's  REASON FOR ASSESSMENT:   Malnutrition Screening Tool    ASSESSMENT:   56 y.o. female with a medical history of hypertension, presented to the emergency department with complaints of abdominal pain, fever, chills, or last several days. Abdominal pain has been waxing and waning. She did have some nausea with vomiting in the emergency department.   Patient reports poor appetite today. Denies any nausea or vomiting today. States she has not eaten anything since 12/28 when her symptoms began. She was on liquids for 2-3 days prior to today. She is now on a regular diet and ordered a [place] salad with OJ and Sprite. Discussed appropriate food options if she becomes nauseous. Pt states she was eating well during the holidays.  No weight has been documented for this admission. Will need weight to assess status. UBW is 215 lb. Nutrition focused physical exam shows no sign of depletion of muscle mass or body fat.  Labs reviewed. Medications: IV Zofran PRN, IV Phenergan PRN  Diet Order:  Diet regular Room service appropriate? Yes; Fluid consistency: Thin  Skin:  Reviewed, no issues  Last BM:  During this admission as pt reports having diarrhea after drinking Ensure supplement  Height:   Ht Readings from Last 1 Encounters:  07/02/16 6\' 1"  (1.854 m)    Weight:   Wt  Readings from Last 1 Encounters:  12/07/15 212 lb (96.2 kg)    Ideal Body Weight:  75 kg  BMI:  There is no height or weight on file to calculate BMI.  Estimated Nutritional Needs:   Kcal:  2200-2400 (based on last recorded weight)  Protein:  110-120g  Fluid:  2L/day  EDUCATION NEEDS:   Education needs addressed  Clayton Bibles, MS, RD, LDN Pager: [phone] After Hours Pager: [phone]

## 2016-07-03 NOTE — Progress Notes (Signed)
PROGRESS NOTE  Lynn Estrada  [id number] DOB: June 08, 1961 DOA: 07/01/2016 PCP: No PCP Per Patient   Brief Narrative: Lynn Estrada is a 56 y.o. female with a history of HTN who presented to the ED 1/1 with abdominal pain, fever, chills. ED work up showed sepsis (fever, tachycardia, tachypnea) due to presumed urinary source based on UA findings with AKI. Ceftriaxone was started, 2L IVF were given and TRH called for admission.    Assessment & Plan: Active Problems:   Pyelonephritis   Severe sepsis ([place])   UTI (urinary tract infection)   AKI (acute kidney injury) ([place])  Severe sepsis secondary to UTI: Presented with fever, tachycardia, tachypnea. Slowly improving as expected, slowly. UA showed TNTC WBC, may bacteria, large leukocytes, positive nitrites. Lactate wnl, no leukocytosis. - Urine and blood cultures pending - Continue ceftriaxone, IVF, pain control  Acute kidney injury: Baseline SCr ~0.7-0.8, upon admission 1.32. Suspect prerenal secondary to dehydration and sepsis - Continue IVF, monitor BMP - Hold ACE/HCTZ for now  Nausea, vomiting, generalized pain: Likely secondary to sepsis with urinary tract infection - Continue antiemetics and pain control as needed  Essential hypertension -Lisinopril/HCTZ held secondary to AKI -Placed on hydralazine PRN  Hypokalemia: Possibly secondary to GI losses versus medications (e.g. HCTZ) - Replaced, monitor BMP  DVT prophylaxis: Lovenox Code Status: Full Family Communication: None at bedside this AM Disposition Plan: [place] home when sepsis resolved and taking reliable po.  Consultants:   None  Procedures:   None  Antimicrobials:  Ceftriaxone 1/1 >>    Subjective: Pt still febrile, feels tired. No chest pain, dyspnea. Not eating much at all at lunch.  Objective: Vitals:   07/03/16 0750 07/03/16 1458 07/03/16 1717 07/03/16 2104  BP:  135/90  129/83  Pulse:  87  84  Resp:  20  19  Temp: 98.7 F  (37.1 C) 98.5 F (36.9 C) 100.3 F (37.9 C) 99.3 F (37.4 C)  TempSrc: Oral Oral Oral Oral  SpO2: 97% 100%  95%  Height:        Intake/Output Summary (Last 24 hours) at 07/03/16 2137 Last data filed at 07/03/16 1200  Gross per 24 hour  Intake              480 ml  Output                0 ml  Net              480 ml   There were no vitals filed for this visit.  Examination: General exam: 56 y.o. female in no distress Respiratory system: Non-labored breathing room air. Clear to auscultation bilaterally.  Cardiovascular system: Regular rate and rhythm. No murmur, rub, or gallop. No JVD, and no pedal edema. Gastrointestinal system: Abdomen soft, non-tender, non-distended, with normoactive bowel sounds. No CVA tenderness. No organomegaly or masses felt. Central nervous system: Alert and oriented. No focal neurological deficits. Extremities: Warm, no deformities Skin: No rashes, lesions no ulcers Psychiatry: Judgement and insight appear normal. Mood & affect appropriate.   Data Reviewed: I have personally reviewed following labs and imaging studies  CBC:  Recent Labs Lab 07/01/16 2102 07/02/16 0939  WBC 8.4 9.1  NEUTROABS  --  7.5  HGB 11.9* 10.6*  HCT 35.6* 32.3*  MCV 82.8 85.0  PLT 251 0000000   Basic Metabolic Panel:  Recent Labs Lab 07/01/16 2102 07/02/16 0939 07/03/16 0516  NA 138 134* 141  K 3.2* 3.3* 3.5  CL 102  101 108  CO2 26 24 24   GLUCOSE 116* 107* 111*  BUN 25* 24* 23*  CREATININE 1.32* 1.28* 1.28*  CALCIUM 9.4 8.2* 8.2*   GFR: CrCl cannot be calculated (Unknown ideal weight.). Liver Function Tests:  Recent Labs Lab 07/01/16 2102 07/02/16 0939  AST 49* 44*  ALT 39 44  ALKPHOS 58 53  BILITOT 1.1 1.1  PROT 7.8 6.8  ALBUMIN 4.3 3.6    Recent Labs Lab 07/01/16 2102  LIPASE 41   No results for input(s): AMMONIA in the last 168 hours. Coagulation Profile:  Recent Labs Lab 07/02/16 0939  INR 1.27   Cardiac Enzymes: No results for  input(s): CKTOTAL, CKMB, CKMBINDEX, TROPONINI in the last 168 hours. BNP (last 3 results) No results for input(s): PROBNP in the last 8760 hours. HbA1C: No results for input(s): HGBA1C in the last 72 hours. CBG: No results for input(s): GLUCAP in the last 168 hours. Lipid Profile: No results for input(s): CHOL, HDL, LDLCALC, TRIG, CHOLHDL, LDLDIRECT in the last 72 hours. Thyroid Function Tests: No results for input(s): TSH, T4TOTAL, FREET4, T3FREE, THYROIDAB in the last 72 hours. Anemia Panel: No results for input(s): VITAMINB12, FOLATE, FERRITIN, TIBC, IRON, RETICCTPCT in the last 72 hours. Urine analysis:    Component Value Date/Time   COLORURINE AMBER (A) 07/01/2016 0144   APPEARANCEUR CLOUDY (A) 07/01/2016 0144   LABSPEC 1.016 07/01/2016 0144   PHURINE 5.0 07/01/2016 0144   GLUCOSEU NEGATIVE 07/01/2016 0144   HGBUR MODERATE (A) 07/01/2016 0144   BILIRUBINUR NEGATIVE 07/01/2016 0144   BILIRUBINUR negative 12/07/2015 1226   KETONESUR 5 (A) 07/01/2016 0144   PROTEINUR 100 (A) 07/01/2016 0144   UROBILINOGEN 0.2 12/07/2015 1226   NITRITE POSITIVE (A) 07/01/2016 0144   LEUKOCYTESUR LARGE (A) 07/01/2016 0144   Sepsis Labs: @LABRCNTIP (procalcitonin:4,lacticidven:4)  ) Recent Results (from the past 240 hour(s))  Culture, blood (x 2)     Status: None (Preliminary result)   Collection Time: 07/02/16  9:39 AM  Result Value Ref Range Status   Specimen Description BLOOD LEFT ARM  Final   Special Requests BOTTLES DRAWN AEROBIC AND ANAEROBIC 6 CC EACH  Final   Culture   Final    NO GROWTH 1 DAY Performed at [facility]    Report Status PENDING  Incomplete  Culture, blood (x 2)     Status: None (Preliminary result)   Collection Time: 07/02/16  9:39 AM  Result Value Ref Range Status   Specimen Description BLOOD RIGHT HAND  Final   Special Requests IN PEDIATRIC BOTTLE 2.5CC  Final   Culture   Final    NO GROWTH 1 DAY Performed at [facility]    Report Status  PENDING  Incomplete     Radiology Studies: Dg Chest 2 View  Result Date: 07/02/2016 CLINICAL DATA:  Worsening n/v since Wednesday, fever, generalized body aches, nonsmoker, no other chest complaints EXAM: CHEST  2 VIEW COMPARISON:  None. FINDINGS: Cardiac silhouette is mildly enlarged. No mediastinal or hilar masses. No evidence of adenopathy mild linear opacity in the lung bases is likely atelectasis. Lungs are otherwise clear. No pleural effusion. No pneumothorax. Skeletal structures are unremarkable. IMPRESSION: No acute cardiopulmonary disease. Electronically Signed   By: Lajean Manes M.D.   On: 07/02/2016 08:15    Scheduled Meds: . aspirin EC  81 mg Oral Daily  . cefTRIAXone (ROCEPHIN)  IV  1 g Intravenous Q24H  . enoxaparin (LOVENOX) injection  40 mg Subcutaneous Q24H  . feeding supplement (  PRO-STAT SUGAR FREE 64)  30 mL Oral BID  . protein supplement shake  11 oz Oral Q24H   Continuous Infusions: . sodium chloride 75 mL/hr at 07/03/16 1210     LOS: 1 day   Time spent: 25 minutes.  Vance Gather, MD [facility] Pager [phone]  If 7PM-7AM, please contact night-coverage [email or web address] Password [facility] 07/03/2016, 9:37 PM

## 2016-07-04 DIAGNOSIS — N12 Tubulo-interstitial nephritis, not specified as acute or chronic: Secondary | ICD-10-CM

## 2016-07-04 LAB — INFLUENZA PANEL BY PCR (TYPE A & B)
INFLAPCR: NEGATIVE
INFLBPCR: NEGATIVE

## 2016-07-04 LAB — BASIC METABOLIC PANEL
Anion gap: 7 (ref 5–15)
BUN: 18 mg/dL (ref 6–20)
CO2: 26 mmol/L (ref 22–32)
Calcium: 8.3 mg/dL — ABNORMAL LOW (ref 8.9–10.3)
Chloride: 108 mmol/L (ref 101–111)
Creatinine, Ser: 1.04 mg/dL — ABNORMAL HIGH (ref 0.44–1.00)
GFR calc Af Amer: >60 mL/min (ref >60)
GFR, EST NON AFRICAN AMERICAN: 60 mL/min — AB (ref >60)
GLUCOSE: 106 mg/dL — AB (ref 65–99)
Potassium: 3.7 mmol/L (ref 3.5–5.1)
Sodium: 141 mmol/L (ref 135–145)

## 2016-07-04 LAB — CBC
HCT: 32.5 % — ABNORMAL LOW (ref 36.0–46.0)
Hemoglobin: 10.5 g/dL — ABNORMAL LOW (ref 12.0–15.0)
MCH: 28 pg (ref 26.0–34.0)
MCHC: 32.3 g/dL (ref 30.0–36.0)
MCV: 86.7 fL (ref 78.0–100.0)
PLATELETS: 198 10*3/uL (ref 150–400)
RBC: 3.75 MIL/uL — ABNORMAL LOW (ref 3.87–5.11)
RDW: 14.7 % (ref 11.5–15.5)
WBC: 6.9 10*3/uL (ref 4.0–10.5)

## 2016-07-04 MED ORDER — OSELTAMIVIR PHOSPHATE 75 MG PO CAPS
75.0000 mg | ORAL_CAPSULE | Freq: Two times a day (BID) | ORAL | Status: DC
  Administered 2016-07-04: 75 mg via ORAL
  Filled 2016-07-04: qty 1

## 2016-07-04 NOTE — Progress Notes (Signed)
PROGRESS NOTE  Lynn Estrada  [id number] DOB: 1961/01/30 DOA: 07/01/2016 PCP: No PCP Per Patient   Brief Narrative: Lynn Estrada is a 56 y.o. female with a history of HTN who presented to the ED 1/1 with abdominal pain, fever, chills. ED work up showed sepsis (fever, tachycardia, tachypnea) due to presumed urinary source based on UA findings with AKI. Ceftriaxone was started, 2L IVF were given and TRH called for admission. She developed worsening body aches and continued low grade fever, so flu PCR was checked and droplet precautions instituted.   Assessment & Plan: Active Problems:   Pyelonephritis   Severe sepsis ([place])   UTI (urinary tract infection)   AKI (acute kidney injury) ([place])  Severe sepsis secondary to UTI: Presented with fever, tachycardia, tachypnea. Slowly improving as expected, slowly. UA showed TNTC WBC, may bacteria, large leukocytes, positive nitrites. Lactate wnl, no leukocytosis. - Urine and blood cultures pending - Continue ceftriaxone, IVF, pain control - Check flu PCR, empiric droplet precautions and tamiflu now pending results. - No tele events, DC telemetry  Acute kidney injury: Baseline SCr ~0.7-0.8, upon admission 1.32. Suspect prerenal secondary to dehydration and sepsis. Improving. - Continue IVF, monitor BMP - Hold ACE/HCTZ for now  Nausea, vomiting, generalized pain: Likely secondary to sepsis with urinary tract infection - Continue antiemetics and pain control as needed - Pt weak, plan on PT prior to discharge.   Essential hypertension - Lisinopril/HCTZ held secondary to AKI - Placed on hydralazine PRN  Hypokalemia: Possibly secondary to GI losses versus medications (e.g. HCTZ) - Replaced, monitor BMP  DVT prophylaxis: Lovenox Code Status: Full Family Communication: None at bedside this AM Disposition Plan: [place] home when sepsis resolved and taking reliable po.  Consultants:   None  Procedures:    None  Antimicrobials:  Ceftriaxone 1/1 >>    Subjective: Pt feels worse with body aches, dry cough, fatigue, taking poor po. No chest pain, dyspnea.  Objective: Vitals:   07/03/16 0750 07/03/16 1458 07/03/16 1717 07/03/16 2104  BP:  135/90  129/83  Pulse:  87  84  Resp:  20  19  Temp: 98.7 F (37.1 C) 98.5 F (36.9 C) 100.3 F (37.9 C) 99.3 F (37.4 C)  TempSrc: Oral Oral Oral Oral  SpO2: 97% 100%  95%  Height:        Intake/Output Summary (Last 24 hours) at 07/04/16 1002 Last data filed at 07/04/16 0500  Gross per 24 hour  Intake          3546.25 ml  Output                0 ml  Net          3546.25 ml   There were no vitals filed for this visit.  Examination: General exam: 56 y.o. female in no distress Respiratory system: Non-labored breathing room air. Clear to auscultation bilaterally.  Cardiovascular system: Regular rate and rhythm. No murmur, rub, or gallop. No JVD, and no pedal edema. Gastrointestinal system: Abdomen soft, non-tender, non-distended, with normoactive bowel sounds. No CVA tenderness. No organomegaly or masses felt. Central nervous system: Alert and oriented. No focal neurological deficits. Extremities: Warm, no deformities Skin: No rashes, lesions no ulcers Psychiatry: Judgement and insight appear normal. Mood & affect appropriate.   Data Reviewed: I have personally reviewed following labs and imaging studies  CBC:  Recent Labs Lab 07/01/16 2102 07/02/16 0939 07/04/16 0626  WBC 8.4 9.1 6.9  NEUTROABS  --  7.5  --  HGB 11.9* 10.6* 10.5*  HCT 35.6* 32.3* 32.5*  MCV 82.8 85.0 86.7  PLT 251 204 [id number]   Basic Metabolic Panel:  Recent Labs Lab 07/01/16 2102 07/02/16 0939 07/03/16 0516 07/04/16 0626  NA 138 134* 141 141  K 3.2* 3.3* 3.5 3.7  CL 102 101 108 108  CO2 26 24 24 26   GLUCOSE 116* 107* 111* 106*  BUN 25* 24* 23* 18  CREATININE 1.32* 1.28* 1.28* 1.04*  CALCIUM 9.4 8.2* 8.2* 8.3*   GFR: CrCl cannot be calculated  (Unknown ideal weight.). Liver Function Tests:  Recent Labs Lab 07/01/16 2102 07/02/16 0939  AST 49* 44*  ALT 39 44  ALKPHOS 58 53  BILITOT 1.1 1.1  PROT 7.8 6.8  ALBUMIN 4.3 3.6    Recent Labs Lab 07/01/16 2102  LIPASE 41   No results for input(s): AMMONIA in the last 168 hours. Coagulation Profile:  Recent Labs Lab 07/02/16 0939  INR 1.27   Cardiac Enzymes: No results for input(s): CKTOTAL, CKMB, CKMBINDEX, TROPONINI in the last 168 hours. BNP (last 3 results) No results for input(s): PROBNP in the last 8760 hours. HbA1C: No results for input(s): HGBA1C in the last 72 hours. CBG: No results for input(s): GLUCAP in the last 168 hours. Lipid Profile: No results for input(s): CHOL, HDL, LDLCALC, TRIG, CHOLHDL, LDLDIRECT in the last 72 hours. Thyroid Function Tests: No results for input(s): TSH, T4TOTAL, FREET4, T3FREE, THYROIDAB in the last 72 hours. Anemia Panel: No results for input(s): VITAMINB12, FOLATE, FERRITIN, TIBC, IRON, RETICCTPCT in the last 72 hours. Urine analysis:    Component Value Date/Time   COLORURINE AMBER (A) 07/01/2016 0144   APPEARANCEUR CLOUDY (A) 07/01/2016 0144   LABSPEC 1.016 07/01/2016 0144   PHURINE 5.0 07/01/2016 0144   GLUCOSEU NEGATIVE 07/01/2016 0144   HGBUR MODERATE (A) 07/01/2016 0144   BILIRUBINUR NEGATIVE 07/01/2016 0144   BILIRUBINUR negative 12/07/2015 1226   KETONESUR 5 (A) 07/01/2016 0144   PROTEINUR 100 (A) 07/01/2016 0144   UROBILINOGEN 0.2 12/07/2015 1226   NITRITE POSITIVE (A) 07/01/2016 0144   LEUKOCYTESUR LARGE (A) 07/01/2016 0144   Sepsis Labs: @LABRCNTIP (procalcitonin:4,lacticidven:4)  ) Recent Results (from the past 240 hour(s))  Culture, blood (x 2)     Status: None (Preliminary result)   Collection Time: 07/02/16  9:39 AM  Result Value Ref Range Status   Specimen Description BLOOD LEFT ARM  Final   Special Requests BOTTLES DRAWN AEROBIC AND ANAEROBIC 6 CC EACH  Final   Culture   Final    NO GROWTH  1 DAY Performed at [facility]    Report Status PENDING  Incomplete  Culture, blood (x 2)     Status: None (Preliminary result)   Collection Time: 07/02/16  9:39 AM  Result Value Ref Range Status   Specimen Description BLOOD RIGHT HAND  Final   Special Requests IN PEDIATRIC BOTTLE 2.5CC  Final   Culture   Final    NO GROWTH 1 DAY Performed at [facility]    Report Status PENDING  Incomplete     Radiology Studies: No results found.  Scheduled Meds: . aspirin EC  81 mg Oral Daily  . cefTRIAXone (ROCEPHIN)  IV  1 g Intravenous Q24H  . enoxaparin (LOVENOX) injection  40 mg Subcutaneous Q24H  . feeding supplement (PRO-STAT SUGAR FREE 64)  30 mL Oral BID  . oseltamivir  75 mg Oral BID  . protein supplement shake  11 oz Oral Q24H   Continuous Infusions: .  sodium chloride 75 mL/hr at 07/03/16 2115     LOS: 2 days   Time spent: 25 minutes.  Vance Gather, MD [facility] Pager [phone]  If 7PM-7AM, please contact night-coverage [email or web address] Password [email or web address] 07/04/2016, 10:02 AM

## 2016-07-04 NOTE — Progress Notes (Signed)
[facility]  New pt for [facility] this admission  [facility] will follow pt while inpatient to support possible need for home IV ABX upon DC.  I have met with pt and advised we will follow "in the background" until notified home IV ABX/home care services are needed.  Pt aware and understand plan.  If patient discharges after hours, please call [phone].   Larry Sierras 07/04/2016, 7:46 AM

## 2016-07-05 ENCOUNTER — Inpatient Hospital Stay (HOSPITAL_COMMUNITY): Payer: 59

## 2016-07-05 LAB — URINE CULTURE: Culture: >=100000 — AB

## 2016-07-05 MED ORDER — AZITHROMYCIN 250 MG PO TABS
500.0000 mg | ORAL_TABLET | Freq: Every day | ORAL | Status: AC
  Administered 2016-07-05 – 2016-07-07 (×3): 500 mg via ORAL
  Filled 2016-07-05 (×3): qty 2

## 2016-07-05 MED ORDER — GUAIFENESIN-DM 100-10 MG/5ML PO SYRP
5.0000 mL | ORAL_SOLUTION | ORAL | Status: DC | PRN
  Administered 2016-07-05 – 2016-07-06 (×5): 5 mL via ORAL
  Filled 2016-07-05 (×5): qty 10

## 2016-07-05 MED ORDER — PREMIER PROTEIN SHAKE
11.0000 [oz_av] | ORAL | Status: DC
  Administered 2016-07-05 – 2016-07-06 (×2): 11 [oz_av] via ORAL
  Filled 2016-07-05: qty 325.31

## 2016-07-05 MED ORDER — OSELTAMIVIR PHOSPHATE 75 MG PO CAPS
75.0000 mg | ORAL_CAPSULE | Freq: Two times a day (BID) | ORAL | Status: DC
  Administered 2016-07-05 – 2016-07-06 (×4): 75 mg via ORAL
  Filled 2016-07-05 (×5): qty 1

## 2016-07-05 NOTE — Progress Notes (Addendum)
PROGRESS NOTE  Lynn Estrada  [id number] DOB: 17-Feb-1961 DOA: 07/01/2016 PCP: No PCP Per Patient   Brief Narrative: Lynn Estrada is a 56 y.o. female with a history of HTN who presented to the ED 1/1 with abdominal pain, fever, chills. ED work up showed sepsis (fever, tachycardia, tachypnea) due to presumed urinary source based on UA findings with AKI. Ceftriaxone was started, 2L IVF were given and TRH called for admission. She developed worsening body aches and continued low grade fever with cough. Flu PCR negative, though suspect possible false negative due to patient's inability to allow adequate specimen. Tamiflu was started. Repeat CXR shows increasing bibasilar airspace opacities, so azithromycin was added.   Assessment & Plan: Active Problems:   Pyelonephritis   Severe sepsis ([place])   UTI (urinary tract infection)   AKI (acute kidney injury) ([place])  Severe sepsis secondary to Klebsiella UTI and possible CAP: Presented with fever, tachycardia, tachypnea. Slowly improving as expected, slowly. UA showed TNTC WBC, may bacteria, large leukocytes, positive nitrites. Lactate wnl, no leukocytosis. Initial CXR without acute infiltrates, though repeat CXR 01/04 demonstrated bibasilar airspace opacities suggestive of pneumonia in the setting of cough and fevers ongoing despite adequate Tx of UTI. - Urine culture grew >100k Klebsiella pneumoniae sensitive to CTX, plan to transition to keflex when improving/afebrile. - Blood cultures pending - Add azithromycin for pneumonia coverage. Continues to spike fevers. - Will also treat empirically for flu due to RN believes specimen quality was poor because the patient wouldn't allow swab past anterior oropharynx. - Continue IVF, pain control as po intake is tolerated but poor.  Acute kidney injury: Baseline SCr ~0.7-0.8, upon admission 1.32. Suspect prerenal secondary to dehydration and sepsis. Improving. - Continue IVF, monitor BMP - Hold  ACE/HCTZ for now  Nausea, vomiting, generalized pain: Improving. Likely secondary to sepsis with urinary tract infection - Continue antiemetics and pain control as needed - Pt weak, will order PT  Essential hypertension - Lisinopril/HCTZ held secondary to AKI - Placed on hydralazine PRN  Hypokalemia: Possibly secondary to GI losses versus medications (e.g. HCTZ) - Replaced, monitor BMP  DVT prophylaxis: Lovenox Code Status: Full Family Communication: None at bedside this AM Disposition Plan: [address] home when sepsis resolved and taking reliable po. Requiring IVF and antibiotics. Not improving as expected.  Consultants:   None  Procedures:   None  Antimicrobials:  Ceftriaxone 1/1 >>    Azithromycin 1/4 >>   Subjective: Pt feels stable/better because no fever so far today. Still with dry cough associated with headache. Not eating because the food is not appetizing. Not nauseous or vomiting. No chest pain, dyspnea. Feels very weak.  Objective: Vitals:   07/04/16 1512 07/04/16 2052 07/05/16 0400 07/05/16 0638  BP: (!) 142/94 128/69  (!) 147/85  Pulse: 87 96  100  Resp: 20 20  20   Temp: 99.3 F (37.4 C) (!) 101.4 F (38.6 C)  98 F (36.7 C)  TempSrc: Oral Oral  Oral  SpO2: 96% 95%  92%  Height:   6' (1.829 m)     Intake/Output Summary (Last 24 hours) at 07/05/16 1210 Last data filed at 07/05/16 0400  Gross per 24 hour  Intake             2375 ml  Output                0 ml  Net             2375 ml  Examination: General exam: 56 y.o. weak-appearing female in no distress Respiratory system: Non-labored breathing room air. Clear to auscultation bilaterally.  Cardiovascular system: Regular rate and rhythm. No murmur, rub, or gallop. No JVD, and no pedal edema. Gastrointestinal system: Abdomen soft, non-tender, non-distended, with normoactive bowel sounds. No CVA tenderness. No organomegaly or masses felt. Central nervous system: Alert and oriented. No  focal neurological deficits. Extremities: Warm, no deformities Skin: No rashes, lesions no ulcers Psychiatry: Judgement and insight appear normal. Mood & affect appropriate.   Data Reviewed: I have personally reviewed following labs and imaging studies  CBC:  Recent Labs Lab 07/01/16 2102 07/02/16 0939 07/04/16 0626  WBC 8.4 9.1 6.9  NEUTROABS  --  7.5  --   HGB 11.9* 10.6* 10.5*  HCT 35.6* 32.3* 32.5*  MCV 82.8 85.0 86.7  PLT 251 204 [id number]   Basic Metabolic Panel:  Recent Labs Lab 07/01/16 2102 07/02/16 0939 07/03/16 0516 07/04/16 0626  NA 138 134* 141 141  K 3.2* 3.3* 3.5 3.7  CL 102 101 108 108  CO2 26 24 24 26   GLUCOSE 116* 107* 111* 106*  BUN 25* 24* 23* 18  CREATININE 1.32* 1.28* 1.28* 1.04*  CALCIUM 9.4 8.2* 8.2* 8.3*   GFR: CrCl cannot be calculated (Unknown ideal weight.). Liver Function Tests:  Recent Labs Lab 07/01/16 2102 07/02/16 0939  AST 49* 44*  ALT 39 44  ALKPHOS 58 53  BILITOT 1.1 1.1  PROT 7.8 6.8  ALBUMIN 4.3 3.6    Recent Labs Lab 07/01/16 2102  LIPASE 41   No results for input(s): AMMONIA in the last 168 hours. Coagulation Profile:  Recent Labs Lab 07/02/16 0939  INR 1.27   Cardiac Enzymes: No results for input(s): CKTOTAL, CKMB, CKMBINDEX, TROPONINI in the last 168 hours. BNP (last 3 results) No results for input(s): PROBNP in the last 8760 hours. HbA1C: No results for input(s): HGBA1C in the last 72 hours. CBG: No results for input(s): GLUCAP in the last 168 hours. Lipid Profile: No results for input(s): CHOL, HDL, LDLCALC, TRIG, CHOLHDL, LDLDIRECT in the last 72 hours. Thyroid Function Tests: No results for input(s): TSH, T4TOTAL, FREET4, T3FREE, THYROIDAB in the last 72 hours. Anemia Panel: No results for input(s): VITAMINB12, FOLATE, FERRITIN, TIBC, IRON, RETICCTPCT in the last 72 hours. Urine analysis:    Component Value Date/Time   COLORURINE AMBER (A) 07/01/2016 0144   APPEARANCEUR CLOUDY (A) 07/01/2016  0144   LABSPEC 1.016 07/01/2016 0144   PHURINE 5.0 07/01/2016 0144   GLUCOSEU NEGATIVE 07/01/2016 0144   HGBUR MODERATE (A) 07/01/2016 0144   BILIRUBINUR NEGATIVE 07/01/2016 0144   BILIRUBINUR negative 12/07/2015 1226   KETONESUR 5 (A) 07/01/2016 0144   PROTEINUR 100 (A) 07/01/2016 0144   UROBILINOGEN 0.2 12/07/2015 1226   NITRITE POSITIVE (A) 07/01/2016 0144   LEUKOCYTESUR LARGE (A) 07/01/2016 0144   Sepsis Labs: @LABRCNTIP (procalcitonin:4,lacticidven:4)  ) Recent Results (from the past 240 hour(s))  Culture, Urine     Status: Abnormal   Collection Time: 07/01/16  4:36 PM  Result Value Ref Range Status   Specimen Description URINE, CLEAN CATCH  Final   Special Requests NONE  Final   Culture >=100,000 COLONIES/mL KLEBSIELLA PNEUMONIAE (A)  Final   Report Status 07/05/2016 FINAL  Final   Organism ID, Bacteria KLEBSIELLA PNEUMONIAE (A)  Final      Susceptibility   Klebsiella pneumoniae - MIC*    AMPICILLIN <=2 RESISTANT Resistant     CEFAZOLIN <=4 SENSITIVE Sensitive  CEFTRIAXONE <=1 SENSITIVE Sensitive     CIPROFLOXACIN <=0.25 SENSITIVE Sensitive     GENTAMICIN <=1 SENSITIVE Sensitive     IMIPENEM <=0.25 SENSITIVE Sensitive     NITROFURANTOIN <=16 SENSITIVE Sensitive     TRIMETH/SULFA <=20 SENSITIVE Sensitive     AMPICILLIN/SULBACTAM <=2 SENSITIVE Sensitive     PIP/TAZO <=4 SENSITIVE Sensitive     Extended ESBL NEGATIVE Sensitive     * >=100,000 COLONIES/mL KLEBSIELLA PNEUMONIAE  Culture, blood (x 2)     Status: None (Preliminary result)   Collection Time: 07/02/16  9:39 AM  Result Value Ref Range Status   Specimen Description BLOOD LEFT ARM  Final   Special Requests BOTTLES DRAWN AEROBIC AND ANAEROBIC 6 CC EACH  Final   Culture   Final    NO GROWTH 2 DAYS Performed at [facility]    Report Status PENDING  Incomplete  Culture, blood (x 2)     Status: None (Preliminary result)   Collection Time: 07/02/16  9:39 AM  Result Value Ref Range Status   Specimen  Description BLOOD RIGHT HAND  Final   Special Requests IN PEDIATRIC BOTTLE 2.5CC  Final   Culture   Final    NO GROWTH 2 DAYS Performed at [facility]    Report Status PENDING  Incomplete     Radiology Studies: Dg Chest 2 View  Result Date: 07/05/2016 CLINICAL DATA:  Cough and recurrent fevers EXAM: CHEST  2 VIEW COMPARISON:  07/02/2016 FINDINGS: Or cardiac shadow again is enlarged in size. Aortic calcifications are again seen. Bibasilar infiltrates are noted increased from the prior exam. No sizable effusion is noted. No acute bony abnormality is noted. IMPRESSION: Increasing bibasilar infiltrates. Electronically Signed   By: Inez Catalina M.D.   On: 07/05/2016 09:53    Scheduled Meds: . aspirin EC  81 mg Oral Daily  . azithromycin  500 mg Oral Daily  . cefTRIAXone (ROCEPHIN)  IV  1 g Intravenous Q24H  . feeding supplement (PRO-STAT SUGAR FREE 64)  30 mL Oral BID  . protein supplement shake  11 oz Oral Q24H   Continuous Infusions: . sodium chloride 75 mL/hr at 07/05/16 0400     LOS: 3 days   Time spent: 25 minutes.  Vance Gather, MD [facility] Pager [phone]  If 7PM-7AM, please contact night-coverage [email or web address] Password [email or web address] 07/05/2016, 12:10 PM

## 2016-07-05 NOTE — Progress Notes (Signed)
Nutrition Follow-up  INTERVENTION:   -D/c Prostat per pt request -Continue Premier Protein supplements once daily, each provides 160 kcal and 30g protein -Encourage PO intake -RD to continue to monitor  NUTRITION DIAGNOSIS:   Inadequate oral intake related to nausea, vomiting, poor appetite as evidenced by per patient/family report.  Ongoing.  GOAL:   Patient will meet greater than or equal to 90% of their needs  Not meeting.  MONITOR:   PO intake, Supplement acceptance, Labs, Weight trends, I & O's  ASSESSMENT:   56 y.o. female with a medical history of hypertension, presented to the emergency department with complaints of abdominal pain, fever, chills, or last several days. Abdominal pain has been waxing and waning. She did have some nausea with vomiting in the emergency department.   Patient in room resting in bed. Pt reports not eating well today because her "eggs were rock hard". Pt prefers softer foods d/t throat discomfort. Pt states she does not want to order food from the kitchen. She states she did like the Premier Protein supplements and would like one for lunch. RD will order. Pt did not like the Prostat supplements, will d/c order. PO intake documented: 25-75% meal completion on 1/3.  Labs reviewed. Medications: Phenergan IV PRN  Diet Order:  Diet regular Room service appropriate? Yes; Fluid consistency: Thin  Skin:  Reviewed, no issues  Last BM:  1/2  Height:   Ht Readings from Last 1 Encounters:  07/05/16 6' (1.829 m)    Weight:   Wt Readings from Last 1 Encounters:  12/07/15 212 lb (96.2 kg)    Ideal Body Weight:  75 kg  BMI:  There is no height or weight on file to calculate BMI.  Estimated Nutritional Needs:   Kcal:  2200-2400 (based on last recorded weight)  Protein:  110-120g  Fluid:  2L/day  EDUCATION NEEDS:   Education needs addressed  Clayton Bibles, MS, RD, LDN Pager: [phone] After Hours Pager: [phone]

## 2016-07-06 DIAGNOSIS — J101 Influenza due to other identified influenza virus with other respiratory manifestations: Secondary | ICD-10-CM

## 2016-07-06 DIAGNOSIS — J189 Pneumonia, unspecified organism: Secondary | ICD-10-CM | POA: Clinically undetermined

## 2016-07-06 LAB — BASIC METABOLIC PANEL
ANION GAP: 11 (ref 5–15)
BUN: 16 mg/dL (ref 6–20)
CALCIUM: 8.2 mg/dL — AB (ref 8.9–10.3)
CO2: 26 mmol/L (ref 22–32)
Chloride: 105 mmol/L (ref 101–111)
Creatinine, Ser: 0.89 mg/dL (ref 0.44–1.00)
GFR calc Af Amer: >60 mL/min (ref >60)
GLUCOSE: 104 mg/dL — AB (ref 65–99)
Potassium: 3.4 mmol/L — ABNORMAL LOW (ref 3.5–5.1)
Sodium: 142 mmol/L (ref 135–145)

## 2016-07-06 LAB — CBC
HCT: 27.8 % — ABNORMAL LOW (ref 36.0–46.0)
Hemoglobin: 9.2 g/dL — ABNORMAL LOW (ref 12.0–15.0)
MCH: 28.3 pg (ref 26.0–34.0)
MCHC: 33.1 g/dL (ref 30.0–36.0)
MCV: 85.5 fL (ref 78.0–100.0)
Platelets: 240 10*3/uL (ref 150–400)
RBC: 3.25 MIL/uL — ABNORMAL LOW (ref 3.87–5.11)
RDW: 14.7 % (ref 11.5–15.5)
WBC: 5.5 10*3/uL (ref 4.0–10.5)

## 2016-07-06 MED ORDER — POTASSIUM CHLORIDE CRYS ER 20 MEQ PO TBCR
20.0000 meq | EXTENDED_RELEASE_TABLET | Freq: Once | ORAL | Status: AC
  Administered 2016-07-06: 20 meq via ORAL
  Filled 2016-07-06: qty 1

## 2016-07-06 MED ORDER — BISACODYL 5 MG PO TBEC
10.0000 mg | DELAYED_RELEASE_TABLET | Freq: Once | ORAL | Status: AC
  Administered 2016-07-06: 10 mg via ORAL
  Filled 2016-07-06: qty 2

## 2016-07-06 NOTE — Evaluation (Signed)
Physical Therapy Evaluation Patient Details Name: Lynn Estrada MRN: [id number] DOB: January 26, 1961 Today's Date: 07/06/2016   History of Present Illness  56 yo female admitted with pyelonephritis, sepsis. hx of HTN  Clinical Impression  On eval, pt was Min guard assist for mobility. She walked ~70 feet with the assistance of a RW. Pt expressed anxiety and fear about mobilizing during session. She participated well. Recommend daily ambulation in hallway with nursing supervision. May need HHPT follow up depending on progress. Discussed RW for ambulation but pt politely declined. She would like to see how her mobility improves over next day or so before agreeing to a RW.     Follow Up Recommendations Supervision for mobility/OOB;Home health PT (depending on progress)    Equipment Recommendations  None recommended by PT (pt declined RW)    Recommendations for Other Services       Precautions / Restrictions Precautions Precautions: Fall Precaution Comments: droplet Restrictions Weight Bearing Restrictions: No      Mobility  Bed Mobility               General bed mobility comments: sitting EOB  Transfers Overall transfer level: Needs assistance Equipment used: Rolling walker (2 wheeled) Transfers: Sit to/from Stand Sit to Stand: Min guard;From elevated surface         General transfer comment: close guard for safety. VCs hand placement  Ambulation/Gait Ambulation/Gait assistance: Min guard Ambulation Distance (Feet): 70 Feet Assistive device: Rolling walker (2 wheeled) Gait Pattern/deviations: Step-through pattern;Decreased stride length     General Gait Details: VCs safety, distance from RW, less reliance on UEs. Slow gait speed. Pt stated she was fearful.   Stairs            Wheelchair Mobility    Modified Rankin (Stroke Patients Only)       Balance                                             Pertinent Vitals/Pain Pain  Assessment: Faces Faces Pain Scale: Hurts a Sgro bit Pain Location: chest Pain Descriptors / Indicators: Sore Pain Intervention(s): Monitored during session    Home Living Family/patient expects to be discharged to:: Private residence Living Arrangements: Spouse/significant other Available Help at Discharge: Family Type of Home: House         Home Equipment: None      Prior Function Level of Independence: Independent               Hand Dominance        Extremity/Trunk Assessment   Upper Extremity Assessment Upper Extremity Assessment: Overall WFL for tasks assessed    Lower Extremity Assessment Lower Extremity Assessment: Generalized weakness    Cervical / Trunk Assessment Cervical / Trunk Assessment: Normal  Communication   Communication: No difficulties  Cognition Arousal/Alertness: Awake/alert Behavior During Therapy: WFL for tasks assessed/performed Overall Cognitive Status: Within Functional Limits for tasks assessed                      General Comments      Exercises     Assessment/Plan    PT Assessment Patient needs continued PT services  PT Problem List Decreased strength;Decreased mobility;Decreased balance;Decreased activity tolerance;Decreased knowledge of use of DME          PT Treatment Interventions DME instruction;Therapeutic activities;Gait training;Therapeutic exercise;Patient/family education;Functional mobility training  PT Goals (Current goals can be found in the Care Plan section)  Acute Rehab PT Goals Patient Stated Goal: to get better, stronger PT Goal Formulation: With patient Time For Goal Achievement: 07/20/16 Potential to Achieve Goals: Good    Frequency Min 3X/week   Barriers to discharge        Co-evaluation               End of Session Equipment Utilized During Treatment: Gait belt Activity Tolerance: Patient limited by fatigue Patient left: in chair;with call bell/phone within  reach;with chair alarm set           Time: 1014-1030 PT Time Calculation (min) (ACUTE ONLY): 16 min   Charges:   PT Evaluation $PT Eval Low Complexity: 1 Procedure     PT G Codes:        Weston Anna, MPT Pager: [phone]

## 2016-07-06 NOTE — Progress Notes (Signed)
PROGRESS NOTE  DEMANI ZULOAGA  [id number] DOB: 1960/11/22 DOA: 07/01/2016 PCP: No PCP Per Patient   Brief Narrative: Quantina Cosco Sobiech is a 56 y.o. female with a history of HTN who presented to the ED 1/1 with abdominal pain, fever, chills. ED work up showed sepsis (fever, tachycardia, tachypnea) due to presumed urinary source based on UA findings with AKI. Ceftriaxone was started, 2L IVF were given and TRH called for admission. She developed worsening body aches and continued low grade fever with cough. Flu PCR negative, though suspect possible false negative due to patient's inability to allow adequate specimen. Tamiflu was started. Repeat CXR showed increasing bibasilar airspace opacities, so azithromycin was added. She has made slow improvement but continues to have poor po intake with nausea and vomiting requiring IV fluids to maintain hydration.   Assessment & Plan: Active Problems:   Pyelonephritis   Severe sepsis ([place])   UTI (urinary tract infection)   AKI (acute kidney injury) ([place])  Severe sepsis: Resolving. Tachycardia and tachypnea improved, BP returning to hypertensive baseline with treatment of infections as below.  - Continue antimicrobials as below. Fever curve improving. - Blood cultures (01/01) NGTD - Continue IVF due to ongoing N/V  Klebsiella UTI: Seen on urine culture from admission. - Urine culture grew >100k Klebsiella pneumoniae sensitive to CTX, plan to transition to oral once improving.  Possible CAP and influenza: Lactate wnl, no leukocytosis. Initial CXR without acute infiltrates, though repeat CXR 01/04 demonstrated bibasilar airspace opacities suggestive of pneumonia in the setting of cough and fevers ongoing despite adequate Tx of UTI. - Continue CTX and azithromycin (added 01/04; plan 500mg  qd x 3 days) for pneumonia coverage.  - Will also treat empirically for flu. Suspect false negative swab as RN believes specimen quality was poor because the  patient wouldn't allow swab past anterior oropharynx.  Acute kidney injury: Baseline SCr ~0.7-0.8, upon admission 1.32. Suspect prerenal secondary to dehydration and sepsis. Resolved. - Continue IVF, monitor BMP - Ok to restart ACE/HCTZ  Nausea, vomiting, generalized pain: Improving. Likely secondary to sepsis with urinary tract infection - Continue antiemetics and IV fluids as po intake remains poor. Would not reliably be able to maintain hydration due to ongoing N/V - Pt weak, PT recommended home health, though strength improved today and anticipate reevaluation will be necessary once improved.  Essential hypertension - Lisinopril/HCTZ held secondary to AKI, will restart as BP rising - Continue hydralazine prn   Hypokalemia: Possibly secondary to GI losses versus medications (e.g. HCTZ) - Replaced, monitor BMP  DVT prophylaxis: Lovenox Code Status: Full Family Communication: None at bedside this AM Disposition Plan: [place] home when sepsis resolved and taking reliable po. Requiring IVF.  Consultants:   None  Procedures:   None  Antimicrobials:  Ceftriaxone 1/1 >>    Azithromycin 1/4 >>   Tamiflu 1/4 >>   Subjective: Pt feels somewhat better, still very weak with low grade fever over past 24 hours. Dry cough improving. Has had an episode of nausea and vomiting this morning. No chest pain, dyspnea. Made very tired by limited PT. Felt a nodule on the bottom of her left foot when walking.  Objective: Vitals:   07/05/16 0638 07/05/16 1500 07/05/16 2041 07/06/16 0543  BP: (!) 147/85 (!) 144/72 (!) 143/95 (!) 156/93  Pulse: 100 88 93 84  Resp: 20 20 20 20   Temp: 98 F (36.7 C) 98 F (36.7 C) 99.2 F (37.3 C) 99.1 F (37.3 C)  TempSrc: Oral Oral  Oral Oral  SpO2: 92% 96% 97% 93%  Height:       No intake or output data in the 24 hours ending 07/06/16 1205 Examination: General exam: 56 y.o. weak-appearing female in no distress Respiratory system:  Non-labored breathing room air. Clear to auscultation bilaterally.  Cardiovascular system: Regular rate and rhythm. No murmur, rub, or gallop. No JVD, and no pedal edema. Gastrointestinal system: Abdomen soft, non-tender, non-distended, with normoactive bowel sounds. No CVA tenderness. No organomegaly or masses felt. Central nervous system: Alert and oriented. No focal neurological deficits. Extremities: Warm, no deformities. Feet appear normal bilaterally. Skin: No rashes, lesions no ulcers Psychiatry: Judgement and insight appear normal. Mood & affect appropriate.   Data Reviewed: I have personally reviewed following labs and imaging studies  CBC:  Recent Labs Lab 07/01/16 2102 07/02/16 0939 07/04/16 0626 07/06/16 0553  WBC 8.4 9.1 6.9 5.5  NEUTROABS  --  7.5  --   --   HGB 11.9* 10.6* 10.5* 9.2*  HCT 35.6* 32.3* 32.5* 27.8*  MCV 82.8 85.0 86.7 85.5  PLT 251 204 198 [id number]   Basic Metabolic Panel:  Recent Labs Lab 07/01/16 2102 07/02/16 0939 07/03/16 0516 07/04/16 0626 07/06/16 0553  NA 138 134* 141 141 142  K 3.2* 3.3* 3.5 3.7 3.4*  CL 102 101 108 108 105  CO2 26 24 24 26 26   GLUCOSE 116* 107* 111* 106* 104*  BUN 25* 24* 23* 18 16  CREATININE 1.32* 1.28* 1.28* 1.04* 0.89  CALCIUM 9.4 8.2* 8.2* 8.3* 8.2*   GFR: CrCl cannot be calculated (Unknown ideal weight.). Liver Function Tests:  Recent Labs Lab 07/01/16 2102 07/02/16 0939  AST 49* 44*  ALT 39 44  ALKPHOS 58 53  BILITOT 1.1 1.1  PROT 7.8 6.8  ALBUMIN 4.3 3.6    Recent Labs Lab 07/01/16 2102  LIPASE 41   No results for input(s): AMMONIA in the last 168 hours. Coagulation Profile:  Recent Labs Lab 07/02/16 0939  INR 1.27   Cardiac Enzymes: No results for input(s): CKTOTAL, CKMB, CKMBINDEX, TROPONINI in the last 168 hours. BNP (last 3 results) No results for input(s): PROBNP in the last 8760 hours. HbA1C: No results for input(s): HGBA1C in the last 72 hours. CBG: No results for input(s):  GLUCAP in the last 168 hours. Lipid Profile: No results for input(s): CHOL, HDL, LDLCALC, TRIG, CHOLHDL, LDLDIRECT in the last 72 hours. Thyroid Function Tests: No results for input(s): TSH, T4TOTAL, FREET4, T3FREE, THYROIDAB in the last 72 hours. Anemia Panel: No results for input(s): VITAMINB12, FOLATE, FERRITIN, TIBC, IRON, RETICCTPCT in the last 72 hours. Urine analysis:    Component Value Date/Time   COLORURINE AMBER (A) 07/01/2016 0144   APPEARANCEUR CLOUDY (A) 07/01/2016 0144   LABSPEC 1.016 07/01/2016 0144   PHURINE 5.0 07/01/2016 0144   GLUCOSEU NEGATIVE 07/01/2016 0144   HGBUR MODERATE (A) 07/01/2016 0144   BILIRUBINUR NEGATIVE 07/01/2016 0144   BILIRUBINUR negative 12/07/2015 1226   KETONESUR 5 (A) 07/01/2016 0144   PROTEINUR 100 (A) 07/01/2016 0144   UROBILINOGEN 0.2 12/07/2015 1226   NITRITE POSITIVE (A) 07/01/2016 0144   LEUKOCYTESUR LARGE (A) 07/01/2016 0144   Sepsis Labs: @LABRCNTIP (procalcitonin:4,lacticidven:4)  ) Recent Results (from the past 240 hour(s))  Culture, Urine     Status: Abnormal   Collection Time: 07/01/16  4:36 PM  Result Value Ref Range Status   Specimen Description URINE, CLEAN CATCH  Final   Special Requests NONE  Final   Culture >=100,000 COLONIES/mL KLEBSIELLA PNEUMONIAE (  A)  Final   Report Status 07/05/2016 FINAL  Final   Organism ID, Bacteria KLEBSIELLA PNEUMONIAE (A)  Final      Susceptibility   Klebsiella pneumoniae - MIC*    AMPICILLIN <=2 RESISTANT Resistant     CEFAZOLIN <=4 SENSITIVE Sensitive     CEFTRIAXONE <=1 SENSITIVE Sensitive     CIPROFLOXACIN <=0.25 SENSITIVE Sensitive     GENTAMICIN <=1 SENSITIVE Sensitive     IMIPENEM <=0.25 SENSITIVE Sensitive     NITROFURANTOIN <=16 SENSITIVE Sensitive     TRIMETH/SULFA <=20 SENSITIVE Sensitive     AMPICILLIN/SULBACTAM <=2 SENSITIVE Sensitive     PIP/TAZO <=4 SENSITIVE Sensitive     Extended ESBL NEGATIVE Sensitive     * >=100,000 COLONIES/mL KLEBSIELLA PNEUMONIAE  Culture,  blood (x 2)     Status: None (Preliminary result)   Collection Time: 07/02/16  9:39 AM  Result Value Ref Range Status   Specimen Description BLOOD LEFT ARM  Final   Special Requests BOTTLES DRAWN AEROBIC AND ANAEROBIC 6 CC EACH  Final   Culture   Final    NO GROWTH 3 DAYS Performed at [facility]    Report Status PENDING  Incomplete  Culture, blood (x 2)     Status: None (Preliminary result)   Collection Time: 07/02/16  9:39 AM  Result Value Ref Range Status   Specimen Description BLOOD RIGHT HAND  Final   Special Requests IN PEDIATRIC BOTTLE 2.5CC  Final   Culture   Final    NO GROWTH 3 DAYS Performed at [facility]    Report Status PENDING  Incomplete     Radiology Studies: Dg Chest 2 View  Result Date: 07/05/2016 CLINICAL DATA:  Cough and recurrent fevers EXAM: CHEST  2 VIEW COMPARISON:  07/02/2016 FINDINGS: Or cardiac shadow again is enlarged in size. Aortic calcifications are again seen. Bibasilar infiltrates are noted increased from the prior exam. No sizable effusion is noted. No acute bony abnormality is noted. IMPRESSION: Increasing bibasilar infiltrates. Electronically Signed   By: Inez Catalina M.D.   On: 07/05/2016 09:53    Scheduled Meds: . aspirin EC  81 mg Oral Daily  . azithromycin  500 mg Oral Daily  . cefTRIAXone (ROCEPHIN)  IV  1 g Intravenous Q24H  . oseltamivir  75 mg Oral BID  . protein supplement shake  11 oz Oral Q24H   Continuous Infusions: . sodium chloride 75 mL/hr at 07/06/16 1100     LOS: 4 days   Time spent: 25 minutes.  Vance Gather, MD [facility] Pager [phone]  If 7PM-7AM, please contact night-coverage [email or web address] Password [email or web address] 07/06/2016, 12:05 PM

## 2016-07-07 DIAGNOSIS — J189 Pneumonia, unspecified organism: Secondary | ICD-10-CM

## 2016-07-07 LAB — CULTURE, BLOOD (ROUTINE X 2)
CULTURE: NO GROWTH
Culture: NO GROWTH

## 2016-07-07 LAB — CBC
HEMATOCRIT: 28 % — AB (ref 36.0–46.0)
Hemoglobin: 9.2 g/dL — ABNORMAL LOW (ref 12.0–15.0)
MCH: 27.8 pg (ref 26.0–34.0)
MCHC: 32.9 g/dL (ref 30.0–36.0)
MCV: 84.6 fL (ref 78.0–100.0)
PLATELETS: 273 10*3/uL (ref 150–400)
RBC: 3.31 MIL/uL — ABNORMAL LOW (ref 3.87–5.11)
RDW: 14.4 % (ref 11.5–15.5)
WBC: 5.5 10*3/uL (ref 4.0–10.5)

## 2016-07-07 LAB — BASIC METABOLIC PANEL
Anion gap: 10 (ref 5–15)
BUN: 13 mg/dL (ref 6–20)
CHLORIDE: 106 mmol/L (ref 101–111)
CO2: 25 mmol/L (ref 22–32)
CREATININE: 0.69 mg/dL (ref 0.44–1.00)
Calcium: 8.3 mg/dL — ABNORMAL LOW (ref 8.9–10.3)
GFR calc Af Amer: >60 mL/min (ref >60)
GFR calc non Af Amer: >60 mL/min (ref >60)
GLUCOSE: 114 mg/dL — AB (ref 65–99)
POTASSIUM: 3.4 mmol/L — AB (ref 3.5–5.1)
Sodium: 141 mmol/L (ref 135–145)

## 2016-07-07 MED ORDER — POTASSIUM CHLORIDE CRYS ER 20 MEQ PO TBCR
40.0000 meq | EXTENDED_RELEASE_TABLET | Freq: Once | ORAL | Status: AC
  Administered 2016-07-07: 40 meq via ORAL
  Filled 2016-07-07: qty 2

## 2016-07-07 MED ORDER — LEVOFLOXACIN 750 MG PO TABS: 750.0000 mg | ORAL_TABLET | Freq: Every day | ORAL | 0 refills | Status: DC

## 2016-07-07 NOTE — Progress Notes (Signed)
Patient discharged home.  Leaving with personal belongings, one prescription, and a rolling walker.  Reports understanding of discharge instructions.  Room air, denies pain, no s/s of distress.  Accompanied by husband.  No complaints.

## 2016-07-07 NOTE — Care Management Note (Signed)
Case Management Note  Patient Details  Name: Lynn Estrada MRN: [id number] Date of Birth: 1961/06/08  Subjective/Objective:   Influenza A, PNA, severe sepsis                 Action/Plan: Discharge Planning: AVS reviewed: Pt not scheduled to dc home on IV abx. NCM contacted [facility] Liaison to make aware. Contacted [facility] DME rep for RW for home. Husband at home to assist with care.  PCP Quay Burow    Expected Discharge Date:  07/07/2016               Expected Discharge Plan:  Home/Self Care  In-House Referral:  NA  Discharge planning Services  CM Consult  Post Acute Care Choice:  NA Choice offered to:  NA  DME Arranged:  Walker rolling DME Agency:  [place]:  NA [place] Agency:  NA  Status of Service:  Completed, signed off  If discussed at [place] of Stay Meetings, dates discussed:    Additional Comments:  Erenest Rasher, RN 07/07/2016, 12:49 PM

## 2016-07-07 NOTE — Discharge Summary (Signed)
Physician Discharge Summary  Lynn Estrada [id number] DOB: 1960-09-03 DOA: 07/01/2016  PCP: No PCP Per Patient  Admit date: 07/01/2016 Discharge date: 07/07/2016  Time spent: 25* minutes  Recommendations for Outpatient Follow-up:  1. Follow up PCP in  2 weeks   Discharge Diagnoses:  Principal Problem:   Severe sepsis ([place]) Active Problems:   Pyelonephritis   UTI (urinary tract infection)   AKI (acute kidney injury) ([place])   CAP (community acquired pneumonia)   Influenza A   Discharge Condition: Stable  Diet recommendation: Low salt diet  There were no vitals filed for this visit.  History of present illness:  56 y.o. female with a medical history of hypertension, presented to the emergency department with complaints of abdominal pain, fever, chills, or last several days. Abdominal pain has been waxing and waning. She did have some nausea with vomiting in the emergency department. Patient also complains of dizziness, dry mouth, and productive cough. She denies any sick contacts or recent travel. Patient did receive her flu acceleration this year. Currently she denies any chest pain, shortness of breath, diarrhea or constipation. Does complain of mild headache.    Hospital Course:   Severe sepsis secondary to UTI -Presented with fever, tachycardia, tachypnea with. Found to have acute kidney injury as well. -Sepsis order set utilized- Blood cultures (01/01) NGTD -UA showed TNTC WBC, may bacteria, large leukocytes, positive nitrites -Urine culture grew Klebsiella, sensitive to Ceftriaxone, Cipro - Will discharge home on Levaquin.  Acute kidney injury - resolved -Based and creatinine possibly 0.7-0.8, upon admission 1.32 -Likely secondary to dehydration and sepsis -Placed on IV fluids - restart Lisinopril/HCTZ  Possible CAP and influenza: Lactate wnl, no leukocytosis. Initial CXR without acute infiltrates, though repeat CXR 01/04 demonstrated bibasilar airspace  opacities suggestive of pneumonia in the setting of cough and fevers ongoing despite adequate Tx of UTI. - Continued on  CTX and azithromycin (added 01/04; plan 500mg  qd x 3 days) for pneumonia coverage.  -  Will discharge home on Po Levaquin 750 mg po daily x 5 days.  Nausea, vomiting, generalized pain -Likely secondary to sepsis with urinary tract infection -resolved  Essential hypertension -Lisinopril/HCTZ    Hypokalemia -potassium today 3.4, will replace potassium K dur 40 meq po x 1. -Possibly secondary to GI losses versus medications   Patient dies not want to have home health PT.  Procedures:  None   Consultations:  None   Discharge Exam: Vitals:   07/07/16 0537 07/07/16 0542  BP: 132/80 129/72  Pulse: 90 93  Resp: 20 20  Temp: 99 F (37.2 C) 98.5 F (36.9 C)    General: Appears in no acute distress Cardiovascular: RRR, S1S2 Respiratory: Clear bilaterally  Discharge Instructions   Discharge Instructions    Diet - low sodium heart healthy    Complete by:  As directed    Increase activity slowly    Complete by:  As directed      Current Discharge Medication List    START taking these medications   Details  levofloxacin (LEVAQUIN) 750 MG tablet Take 1 tablet (750 mg total) by mouth daily. Qty: 5 tablet, Refills: 0      CONTINUE these medications which have NOT CHANGED   Details  aspirin EC 81 MG tablet Take 81 mg by mouth daily.    lisinopril-hydrochlorothiazide (PRINZIDE,ZESTORETIC) 10-12.5 MG tablet Take 1 tablet by mouth daily. Qty: 90 tablet, Refills: 3   Associated Diagnoses: Benign essential HTN    Multiple Vitamins-Minerals (MULTIVITAMIN  ADULT PO) Take 1 tablet by mouth daily.       Allergies  Allergen Reactions  . Bactrim [Sulfamethoxazole-Trimethoprim]     rash      The results of significant diagnostics from this hospitalization (including imaging, microbiology, ancillary and laboratory) are listed below for reference.     Significant Diagnostic Studies: Dg Chest 2 View  Result Date: 07/05/2016 CLINICAL DATA:  Cough and recurrent fevers EXAM: CHEST  2 VIEW COMPARISON:  07/02/2016 FINDINGS: Or cardiac shadow again is enlarged in size. Aortic calcifications are again seen. Bibasilar infiltrates are noted increased from the prior exam. No sizable effusion is noted. No acute bony abnormality is noted. IMPRESSION: Increasing bibasilar infiltrates. Electronically Signed   By: Inez Catalina M.D.   On: 07/05/2016 09:53   Dg Chest 2 View  Result Date: 07/02/2016 CLINICAL DATA:  Worsening n/v since Wednesday, fever, generalized body aches, nonsmoker, no other chest complaints EXAM: CHEST  2 VIEW COMPARISON:  None. FINDINGS: Cardiac silhouette is mildly enlarged. No mediastinal or hilar masses. No evidence of adenopathy mild linear opacity in the lung bases is likely atelectasis. Lungs are otherwise clear. No pleural effusion. No pneumothorax. Skeletal structures are unremarkable. IMPRESSION: No acute cardiopulmonary disease. Electronically Signed   By: Lajean Manes M.D.   On: 07/02/2016 08:15    Microbiology: Recent Results (from the past 240 hour(s))  Culture, Urine     Status: Abnormal   Collection Time: 07/01/16  4:36 PM  Result Value Ref Range Status   Specimen Description URINE, CLEAN CATCH  Final   Special Requests NONE  Final   Culture >=100,000 COLONIES/mL KLEBSIELLA PNEUMONIAE (A)  Final   Report Status 07/05/2016 FINAL  Final   Organism ID, Bacteria KLEBSIELLA PNEUMONIAE (A)  Final      Susceptibility   Klebsiella pneumoniae - MIC*    AMPICILLIN <=2 RESISTANT Resistant     CEFAZOLIN <=4 SENSITIVE Sensitive     CEFTRIAXONE <=1 SENSITIVE Sensitive     CIPROFLOXACIN <=0.25 SENSITIVE Sensitive     GENTAMICIN <=1 SENSITIVE Sensitive     IMIPENEM <=0.25 SENSITIVE Sensitive     NITROFURANTOIN <=16 SENSITIVE Sensitive     TRIMETH/SULFA <=20 SENSITIVE Sensitive     AMPICILLIN/SULBACTAM <=2 SENSITIVE Sensitive      PIP/TAZO <=4 SENSITIVE Sensitive     Extended ESBL NEGATIVE Sensitive     * >=100,000 COLONIES/mL KLEBSIELLA PNEUMONIAE  Culture, blood (x 2)     Status: None (Preliminary result)   Collection Time: 07/02/16  9:39 AM  Result Value Ref Range Status   Specimen Description BLOOD LEFT ARM  Final   Special Requests BOTTLES DRAWN AEROBIC AND ANAEROBIC 6 CC EACH  Final   Culture   Final    NO GROWTH 4 DAYS Performed at [facility]    Report Status PENDING  Incomplete  Culture, blood (x 2)     Status: None (Preliminary result)   Collection Time: 07/02/16  9:39 AM  Result Value Ref Range Status   Specimen Description BLOOD RIGHT HAND  Final   Special Requests IN PEDIATRIC BOTTLE 2.5CC  Final   Culture   Final    NO GROWTH 4 DAYS Performed at [facility]    Report Status PENDING  Incomplete     Labs: Basic Metabolic Panel:  Recent Labs Lab 07/02/16 0939 07/03/16 0516 07/04/16 0626 07/06/16 0553 07/07/16 0629  NA 134* 141 141 142 141  K 3.3* 3.5 3.7 3.4* 3.4*  CL 101 108 108  105 106  CO2 24 24 26 26 25   GLUCOSE 107* 111* 106* 104* 114*  BUN 24* 23* 18 16 13   CREATININE 1.28* 1.28* 1.04* 0.89 0.69  CALCIUM 8.2* 8.2* 8.3* 8.2* 8.3*   Liver Function Tests:  Recent Labs Lab 07/01/16 2102 07/02/16 0939  AST 49* 44*  ALT 39 44  ALKPHOS 58 53  BILITOT 1.1 1.1  PROT 7.8 6.8  ALBUMIN 4.3 3.6    Recent Labs Lab 07/01/16 2102  LIPASE 41   No results for input(s): AMMONIA in the last 168 hours. CBC:  Recent Labs Lab 07/01/16 2102 07/02/16 0939 07/04/16 0626 07/06/16 0553 07/07/16 0629  WBC 8.4 9.1 6.9 5.5 5.5  NEUTROABS  --  7.5  --   --   --   HGB 11.9* 10.6* 10.5* 9.2* 9.2*  HCT 35.6* 32.3* 32.5* 27.8* 28.0*  MCV 82.8 85.0 86.7 85.5 84.6  PLT 251 204 198 240 273       Signed:  Mckinna Demars S MD.  [facility] 07/07/2016, 10:26 AM

## 2016-07-10 ENCOUNTER — Encounter: Payer: Self-pay | Admitting: Physician Assistant

## 2016-07-10 ENCOUNTER — Ambulatory Visit (INDEPENDENT_AMBULATORY_CARE_PROVIDER_SITE_OTHER): Payer: BLUE CROSS/BLUE SHIELD | Admitting: Physician Assistant

## 2016-07-10 VITALS — BP 156/94 | HR 90 | Temp 98.5°F | Resp 18 | Ht 72.0 in | Wt 214.0 lb

## 2016-07-10 DIAGNOSIS — Z09 Encounter for follow-up examination after completed treatment for conditions other than malignant neoplasm: Secondary | ICD-10-CM

## 2016-07-10 DIAGNOSIS — I10 Essential (primary) hypertension: Secondary | ICD-10-CM

## 2016-07-10 DIAGNOSIS — N179 Acute kidney failure, unspecified: Secondary | ICD-10-CM

## 2016-07-10 DIAGNOSIS — J101 Influenza due to other identified influenza virus with other respiratory manifestations: Secondary | ICD-10-CM | POA: Diagnosis not present

## 2016-07-10 DIAGNOSIS — D649 Anemia, unspecified: Secondary | ICD-10-CM

## 2016-07-10 DIAGNOSIS — A419 Sepsis, unspecified organism: Secondary | ICD-10-CM

## 2016-07-10 DIAGNOSIS — R6520 Severe sepsis without septic shock: Secondary | ICD-10-CM

## 2016-07-10 DIAGNOSIS — J189 Pneumonia, unspecified organism: Secondary | ICD-10-CM | POA: Diagnosis not present

## 2016-07-10 DIAGNOSIS — N12 Tubulo-interstitial nephritis, not specified as acute or chronic: Secondary | ICD-10-CM

## 2016-07-10 DIAGNOSIS — N390 Urinary tract infection, site not specified: Secondary | ICD-10-CM

## 2016-07-10 DIAGNOSIS — Z1231 Encounter for screening mammogram for malignant neoplasm of breast: Secondary | ICD-10-CM

## 2016-07-10 MED ORDER — LISINOPRIL-HYDROCHLOROTHIAZIDE 10-12.5 MG PO TABS: 1.0000 | ORAL_TABLET | Freq: Every day | ORAL | 3 refills | Status: DC

## 2016-07-10 MED ORDER — LISINOPRIL-HYDROCHLOROTHIAZIDE 20-12.5 MG PO TABS: 1.0000 | ORAL_TABLET | Freq: Every day | ORAL | 3 refills | Status: DC

## 2016-07-10 NOTE — Progress Notes (Signed)
Patient ID: Lynn Estrada, female    DOB: 11/23/1960, 56 y.o.   MRN: [id number]  PCP: No PCP Per Patient  Chief Complaint  Patient presents with  . Follow-up    PNEUMONIA    Subjective:   Presents for hospital follow-up.  On 07/01/16 she presented to the ED with abdominal pain, fever, chills for several days. She was found to be septic, with acute kidney injury, possible pneumonia and influenza. Urine culture grew Klebsiella. Acute kidney injury resolved. Discharged 07/07/2016 on Levaquin x 5 days. She has 3 doses remaining.  Notes and labs reviewed. Anemia. Discharge Hgb 9.2, down from 11.9 on admission. Fingers and toes feel cold. Otherwise, she feels tired, but overall that she is significantly improved.  Notes that sister has Lupus. Mother has sarcoidosis.  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   Review of Systems  Constitutional: Negative.   HENT: Negative for sore throat.   Eyes: Negative for visual disturbance.  Respiratory: Negative for cough, chest tightness, shortness of breath and wheezing.   Cardiovascular: Negative for chest pain and palpitations.  Gastrointestinal: Negative for abdominal pain, diarrhea, nausea and vomiting.  Genitourinary: Negative for dysuria, frequency, hematuria and urgency.  Musculoskeletal: Negative for arthralgias and myalgias.  Skin: Negative for rash.  Neurological: Negative for dizziness, weakness and headaches.  Psychiatric/Behavioral: Negative for decreased concentration. The patient is not nervous/anxious.        Patient Active Problem List   Diagnosis Date Noted  . CAP (community acquired pneumonia) 07/06/2016  . Influenza A 07/06/2016  . Pyelonephritis 07/02/2016  . Severe sepsis ([place]) 07/02/2016  . UTI (urinary tract infection) 07/02/2016  . AKI (acute kidney injury) ([place]) 07/02/2016  . Essential hypertension 07/26/2015  .  Eczema 07/26/2015  . BMI 28.0-28.9,adult 07/26/2015     Prior to Admission medications   Medication Sig Start Date End Date Taking? Authorizing Provider  aspirin EC 81 MG tablet Take 81 mg by mouth daily.   Yes Historical Provider, MD  levofloxacin (LEVAQUIN) 750 MG tablet Take 1 tablet (750 mg total) by mouth daily. 07/07/16  Yes Oswald Hillock, MD  lisinopril-hydrochlorothiazide (PRINZIDE,ZESTORETIC) 10-12.5 MG tablet Take 1 tablet by mouth daily. 07/26/15  Yes Deniro Laymon, PA-C  Multiple Vitamins-Minerals (MULTIVITAMIN ADULT PO) Take 1 tablet by mouth daily.   Yes Historical Provider, MD     Allergies  Allergen Reactions  . Bactrim [Sulfamethoxazole-Trimethoprim]     rash       Objective:  Physical Exam  Constitutional: She is oriented to person, place, and time. She appears well-developed and well-nourished. She is active and cooperative. No distress.  BP (!) 156/94 (BP Location: Right Arm, Patient Position: Sitting, Cuff Size: Small)   Pulse 90   Temp 98.5 F (36.9 C) (Oral)   Resp 18   Ht 6' (1.829 m)   Wt 214 lb (97.1 kg)   SpO2 97%   BMI 29.02 kg/m   HENT:  Head: Normocephalic and atraumatic.  Right Ear: Hearing normal.  Left Ear: Hearing normal.  Eyes: Conjunctivae are normal. No scleral icterus.  Neck: Normal range of motion. Neck supple. No thyromegaly present.  Cardiovascular: Normal rate, regular rhythm and normal heart sounds.   Pulses:      Radial pulses are 2+ on the right side, and 2+ on the left side.  Pulmonary/Chest: Effort normal and breath sounds normal.  Lymphadenopathy:  Head (right side): No tonsillar, no preauricular, no posterior auricular and no occipital adenopathy present.       Head (left side): No tonsillar, no preauricular, no posterior auricular and no occipital adenopathy present.    She has no cervical adenopathy.       Right: No supraclavicular adenopathy present.       Left: No supraclavicular adenopathy present.    Neurological: She is alert and oriented to person, place, and time. No sensory deficit.  Skin: Skin is warm, dry and intact. No rash noted. No cyanosis or erythema. Nails show no clubbing.  Psychiatric: She has a normal mood and affect. Her speech is normal and behavior is normal.           Assessment & Plan:   1. Hospital discharge follow-up Significantly improved.  2. Severe sepsis ([place]) 3. Community acquired pneumonia, unspecified laterality 4. Influenza A 5. AKI (acute kidney injury) ([place]) 6. Urinary tract infection without hematuria, site unspecified 7. Pyelonephritis All resolved/improved. Complete course of Levaquin. Slowly increase activity as able. Return to work next week.  8. Essential hypertension Not controlled. Increase lisinoprilHCTZ to 20-12.5 mg and re-evaluate in 6 weeks. - Comprehensive metabolic panel - TSH  9. Anemia, unspecified type Not addressed during hospitalization. May be due to acute renal injury, now corrected. Await lab results. - CBC with Differential/Platelet - Vitamin B12 - Fe+TIBC+Fer - Ambulatory referral to Gastroenterology  10. Encounter for screening mammogram for breast cancer - MM DIGITAL SCREENING BILATERAL; Future   Fara Chute, PA-C Physician Assistant-Certified [facility]

## 2016-07-10 NOTE — Patient Instructions (Signed)
     IF you received an x-ray today, you will receive an invoice from [facility]. Please contact [facility] at [phone] with questions or concerns regarding your invoice.   IF you received labwork today, you will receive an invoice from [facility]. Please contact [facility] at [phone] with questions or concerns regarding your invoice.   Our billing staff will not be able to assist you with questions regarding bills from these companies.  You will be contacted with the lab results as soon as they are available. The fastest way to get your results is to activate your My Chart account. Instructions are located on the last page of this paperwork. If you have not heard from us regarding the results in 2 weeks, please contact this office.     

## 2016-07-11 ENCOUNTER — Encounter: Payer: Self-pay | Admitting: Physician Assistant

## 2016-07-11 LAB — CBC WITH DIFFERENTIAL/PLATELET
BASOS: 0 %
Basophils Absolute: 0 10*3/uL (ref 0.0–0.2)
EOS (ABSOLUTE): 0.1 10*3/uL (ref 0.0–0.4)
Eos: 2 %
Hematocrit: 31.2 % — ABNORMAL LOW (ref 34.0–46.6)
Hemoglobin: 10.1 g/dL — ABNORMAL LOW (ref 11.1–15.9)
Immature Grans (Abs): 0.1 10*3/uL (ref 0.0–0.1)
Immature Granulocytes: 2 %
LYMPHS ABS: 1.6 10*3/uL (ref 0.7–3.1)
Lymphs: 29 %
MCH: 27.4 pg (ref 26.6–33.0)
MCHC: 32.4 g/dL (ref 31.5–35.7)
MCV: 85 fL (ref 79–97)
MONOS ABS: 0.5 10*3/uL (ref 0.1–0.9)
Monocytes: 9 %
NEUTROS ABS: 3.1 10*3/uL (ref 1.4–7.0)
NEUTROS PCT: 58 %
PLATELETS: 519 10*3/uL — AB (ref 150–379)
RBC: 3.68 x10E6/uL — ABNORMAL LOW (ref 3.77–5.28)
RDW: 14.5 % (ref 12.3–15.4)
WBC: 5.4 10*3/uL (ref 3.4–10.8)

## 2016-07-11 LAB — FE+TIBC+FER
FERRITIN: 338 ng/mL — AB (ref 15–150)
Iron Saturation: 17 % (ref 15–55)
Iron: 43 ug/dL (ref 27–159)
TIBC: 251 ug/dL (ref 250–450)
UIBC: 208 ug/dL (ref 131–425)

## 2016-07-11 LAB — COMPREHENSIVE METABOLIC PANEL
A/G RATIO: 1.3 (ref 1.2–2.2)
ALT: 45 IU/L — AB (ref 0–32)
AST: 28 IU/L (ref 0–40)
Albumin: 3.9 g/dL (ref 3.5–5.5)
Alkaline Phosphatase: 58 IU/L (ref 39–117)
BILIRUBIN TOTAL: 0.3 mg/dL (ref 0.0–1.2)
BUN/Creatinine Ratio: 15 (ref 9–23)
BUN: 13 mg/dL (ref 6–24)
CHLORIDE: 102 mmol/L (ref 96–106)
CO2: 23 mmol/L (ref 18–29)
Calcium: 9.1 mg/dL (ref 8.7–10.2)
Creatinine, Ser: 0.86 mg/dL (ref 0.57–1.00)
GFR calc Af Amer: 89 mL/min/{1.73_m2} (ref >59)
GFR calc non Af Amer: 77 mL/min/{1.73_m2} (ref >59)
Globulin, Total: 3 g/dL (ref 1.5–4.5)
Glucose: 91 mg/dL (ref 65–99)
POTASSIUM: 4.3 mmol/L (ref 3.5–5.2)
SODIUM: 142 mmol/L (ref 134–144)
Total Protein: 6.9 g/dL (ref 6.0–8.5)

## 2016-07-11 LAB — TSH: TSH: 1.36 u[IU]/mL (ref 0.450–4.500)

## 2016-07-11 LAB — VITAMIN B12: VITAMIN B 12: 1376 pg/mL — AB (ref 232–1245)

## 2016-07-13 ENCOUNTER — Encounter: Payer: Self-pay | Admitting: Physician Assistant

## 2016-07-20 ENCOUNTER — Ambulatory Visit (INDEPENDENT_AMBULATORY_CARE_PROVIDER_SITE_OTHER): Payer: BLUE CROSS/BLUE SHIELD | Admitting: Physician Assistant

## 2016-07-20 ENCOUNTER — Other Ambulatory Visit: Payer: Self-pay | Admitting: Emergency Medicine

## 2016-07-20 ENCOUNTER — Encounter: Payer: Self-pay | Admitting: Physician Assistant

## 2016-07-20 VITALS — BP 128/70 | HR 96 | Ht 73.0 in | Wt 212.0 lb

## 2016-07-20 DIAGNOSIS — K5900 Constipation, unspecified: Secondary | ICD-10-CM

## 2016-07-20 DIAGNOSIS — D649 Anemia, unspecified: Secondary | ICD-10-CM

## 2016-07-20 DIAGNOSIS — R12 Heartburn: Secondary | ICD-10-CM

## 2016-07-20 MED ORDER — NA SULFATE-K SULFATE-MG SULF 17.5-3.13-1.6 GM/177ML PO SOLN: 1.0000 | ORAL | 0 refills | Status: DC

## 2016-07-20 MED ORDER — OMEPRAZOLE 20 MG PO TBEC: 20.0000 mg | DELAYED_RELEASE_TABLET | Freq: Every day | ORAL | 2 refills | Status: DC

## 2016-07-20 NOTE — Progress Notes (Signed)
Chief Complaint: Anemia  HPI:  Lynn Estrada is a 56 year old African-American female with past medical history significant for AKI, CAP, hypertension and recent hospitalization for severe sepsis who was referred to me by [name], PA-C for a complaint of anemia.     Per review of chart patient was recently admitted to the hospital from 07/01/16 31/6/18 for severe sepsis related to UTI and community-acquired pneumonia as well as the flu. Patient initially complained of abdominal pain, fever, chills and dizziness as well as a productive cough. She was found to have acute kidney injury and sepsis. She was treated with antibiotics. Labs while in the hospital showed a hemoglobin low at 9.2. These were rechecked at time of discharge on 07/10/16 by patient's PCP and hemoglobin had increased to 10.1. Iron studies were also recently completed and normal with a high ferritin at 338 and a normal vitamin B12.    Today, the patient describes that she was very sick in the hospital and since time of discharge has had heartburn to "everything I eat". The patient has been chewing a lot of Tums recently which help her with her immediate heartburn symptoms. She denies previous history of heartburn or epigastric pain.    Patient also tells me that she has had a change in bowel habits since time of discharge, noting that she has been constipated. This has happened very rarely in her past. She took a laxative this morning and did have a successful bowel movement afterwards.    She is aware that she is anemic and that she has been for some time. She denies seeing any bright red blood or black tarry sticky stools. She has h/o hysterectomy >77yrs ago.   Patient does admit to occasional nosebleeds with large amounts of blood produced, but these are very intermittent. Patient did have one last week.   Patient denies fever, chills, weight loss, fatigue, anorexia, nausea, vomiting, abdominal pain or symptoms that awaken her at  night.   Past Medical History:  Diagnosis Date  . AKI (acute kidney injury) ([place]) 07/02/2016  . CAP (community acquired pneumonia) 07/06/2016  . Fibroid, uterine   . Hypertension   . Pyelonephritis 07/02/2016  . Severe sepsis ([place]) 07/02/2016    Past Surgical History:  Procedure Laterality Date  . ABDOMINAL HYSTERECTOMY     uterine fibroids; ovaries remain  . BACK SURGERY  1996   lumbar  . CHOLECYSTECTOMY    . PARTIAL NEPHRECTOMY Right    due to large kidney stone    Current Outpatient Prescriptions  Medication Sig Dispense Refill  . aspirin EC 81 MG tablet Take 81 mg by mouth daily.    Marland Kitchen levofloxacin (LEVAQUIN) 750 MG tablet Take 1 tablet (750 mg total) by mouth daily. 5 tablet 0  . lisinopril-hydrochlorothiazide (PRINZIDE,ZESTORETIC) 20-12.5 MG tablet Take 1 tablet by mouth daily. 90 tablet 3  . Multiple Vitamins-Minerals (MULTIVITAMIN ADULT PO) Take 1 tablet by mouth daily.     No current facility-administered medications for this visit.     Allergies as of 07/20/2016 - Review Complete 07/20/2016  Allergen Reaction Noted  . Bactrim [sulfamethoxazole-trimethoprim]  02/07/2014    Family History  Problem Relation Age of Onset  . Hypertension Sister   . Eczema Sister   . Lupus Sister   . Sarcoidosis Mother   . Prostate cancer Father   . Gout Maternal Grandmother   . Diabetes Maternal Grandmother     leg amputations  . Breast cancer Maternal Aunt   .  Stomach cancer Neg Hx   . Colon cancer Neg Hx     Social History   Social History  . Marital status: Married    Spouse name: Richard Poulton  . Number of children: 1  . 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 Smoking status: Former Smoker    Packs/day: 0.75    Years: 5.00    Types: Cigarettes  . Smokeless tobacco: Never Used     Comment: smoked back in her 30's  . Alcohol use 0.0 oz/week     Comment: social use; once a month  . Drug use: No    . Sexual activity: Yes    Partners: Male    Birth control/ protection: Surgical   Other Topics Concern  . Not on file   Social History Narrative   Lives with her husband and their daughter and their dog.    Review of Systems:     Constitutional: No weight loss, fever or chills currently HEENT: Eyes: No change in vision               Ears, Nose, Throat:  No change in hearing or congestion positive for nosebleeds Skin: No rash  Cardiovascular: No chest pain Respiratory: No SOB  Gastrointestinal: See HPI and otherwise negative Genitourinary: Positive for excessive urination and urine leakage  Neurological: Positive for headaches  Musculoskeletal: Positive for chronic arthritis Hematologic: No bleeding  Psychiatric: No history of depression or anxiety   Physical Exam:  Vital signs: BP 128/70   Pulse 96   Ht 6\' 1"  (1.854 m)   Wt 212 lb (96.2 kg)   BMI 27.97 kg/m    Constitutional:   Pleasant African-American female appears to be in NAD, Well developed, Well nourished, alert and cooperative Head:  Normocephalic and atraumatic. Eyes:   PEERL, EOMI. No icterus. Conjunctiva pink. Ears:  Normal auditory acuity. Neck:  Supple Throat: Oral cavity and pharynx without inflammation, swelling or lesion.  Respiratory: Respirations even and unlabored. Lungs clear to auscultation bilaterally.   No wheezes, crackles, or rhonchi.  Cardiovascular: Normal S1, S2. No MRG. Regular rate and rhythm. No peripheral edema, cyanosis or pallor.  Gastrointestinal:  Soft, nondistended, nontender. No rebound or guarding. Normal bowel sounds. No appreciable masses or hepatomegaly. Rectal:  Not performed.  Msk:  Symmetrical without gross deformities. Without edema, no deformity or joint abnormality.  Neurologic:  Alert and  oriented x4;  grossly normal neurologically.  Skin:   Dry and intact without significant lesions or rashes. Psychiatric: Demonstrates good judgement and reason without abnormal affect  or behaviors.  RELEVANT LABS AND IMAGING: CBC    Component Value Date/Time   WBC 5.4 07/10/2016 1213   WBC 5.5 07/07/2016 0629   RBC 3.68 (L) 07/10/2016 1213   RBC 3.31 (L) 07/07/2016 0629   HGB 9.2 (L) 07/07/2016 0629   HCT 31.2 (L) 07/10/2016 1213   PLT 519 (H) 07/10/2016 1213   MCV 85 07/10/2016 1213   MCH 27.4 07/10/2016 1213   MCH 27.8 07/07/2016 0629   MCHC 32.4 07/10/2016 1213   MCHC 32.9 07/07/2016 0629   RDW 14.5 07/10/2016 1213   LYMPHSABS 1.6 07/10/2016 1213   MONOABS 1.0 07/02/2016 0939   EOSABS 0.1 07/10/2016 1213   BASOSABS 0.0 07/10/2016 1213    CMP     Component Value Date/Time   NA 142 07/10/2016 1213   K 4.3 07/10/2016 1213   CL  102 07/10/2016 1213   CO2 23 07/10/2016 1213   GLUCOSE 91 07/10/2016 1213   GLUCOSE 114 (H) 07/07/2016 0629   BUN 13 07/10/2016 1213   CREATININE 0.86 07/10/2016 1213   CREATININE 0.76 12/07/2015 1239   CALCIUM 9.1 07/10/2016 1213   PROT 6.9 07/10/2016 1213   ALBUMIN 3.9 07/10/2016 1213   AST 28 07/10/2016 1213   ALT 45 (H) 07/10/2016 1213   ALKPHOS 58 07/10/2016 1213   BILITOT 0.3 07/10/2016 1213   GFRNONAA 77 07/10/2016 1213   GFRNONAA >89 12/07/2015 1239   GFRAA 89 07/10/2016 1213   GFRAA >89 12/07/2015 1239    Assessment: 1. Anemia: Per chart review it appears patient has chronic anemia, though in June of last year hgb was around 12.1 and appears to be slowly decreasing, recently with a low of 9.2 during recent hospitalization for sepsis. Last draw was 07/10/2016 and hgb was 10.1. Question possible GI etiology versus other 2. Heartburn: Increased over the past 2 weeks 3. Constipation: Recently since discharge, patient has been trouble having trouble with constipation, was very intermittent prior to this  Plan: 1. Patient is overdue for a screening colonoscopy and at this time is found to be anemic. She also has had onset of heartburn symptoms over the past couple of weeks. Recommend an EGD and colonoscopy for  further evaluation. These are scheduled in the [place] [facility] with Dr. Ardis Hughs. Discussed risks, benefits, limitations and alternatives the patient agrees to proceed. 2. Prescribed Omeprazole 20 mg daily, 30-60 minutes before breakfast #30 with 1 refill 3. Reviewed antireflux diet and lifestyle modifications 4. Discuss constipation as well as recommendations for high fiber diet, water and exercise. Discussed with patient that if EGD and colonoscopy are negative for etiology for anemia, we'll likely repeat labs at that time, but if continues to be anemic may discuss pill capsule endoscopy versus referral to hematology per Dr. Ardis Hughs recommendations 5. Patient to follow in clinic pe Dr. Ardis Hughs recommendations in the future  Ellouise Newer, PA-C [facility] 07/20/2016, 2:50 PM  Cc: [name], PA-C

## 2016-07-20 NOTE — Patient Instructions (Signed)
You have been scheduled for an endoscopy and colonoscopy. Please follow the written instructions given to you at your visit today. Please pick up your prep supplies at the pharmacy within the next 1-3 days. If you use inhalers (even only as needed), please bring them with you on the day of your procedure. Your physician has requested that you go to www.startemmi.com and enter the access code given to you at your visit today. This web site gives a general overview about your procedure. However, you should still follow specific instructions given to you by our office regarding your preparation for the procedure.  We have sent the following medications to your pharmacy for you to pick up at your convenience: Omeprazole 20 mg 30-60 mins before breakfast.

## 2016-07-23 NOTE — Progress Notes (Signed)
I agree with the above note, plan 

## 2016-07-24 ENCOUNTER — Telehealth: Payer: Self-pay

## 2016-07-24 NOTE — Telephone Encounter (Signed)
PATIENT STATES SHE SAW CHELLE A COUPLE OF WEEKS AGO. CHELLE PUT HER ON LISINOPRIL-HCTZ 12.5 MG. SHE IS NOT SLEEPING AND GETTING HOT FLASHES LIKE CRAZY. SHE IS ALL CONFUSED ON HOW SHE SHOULD TAKE IT AND IF THESE SIDE EFFECTS COULD BE FROM THE MEDICATION? BEST PHONE [phone]  [facility] CHOICE IS [facility] [address]. [place]

## 2016-07-25 NOTE — Telephone Encounter (Signed)
Pt states, since starting increased dose Lisinopril-HCTZ 20-12.5 mg tablet daily last Tuesday, she is having frequent episodes of hot flashes throughout the day followed by sleep deprivation.  Denies freq cough, dizziness or sob Advised to stop taking medication and return to clinic tomorrow for re- evaluation Transferred to schedulers

## 2016-07-25 NOTE — Telephone Encounter (Signed)
Left message to return message 

## 2016-07-26 ENCOUNTER — Encounter: Payer: Self-pay | Admitting: Emergency Medicine

## 2016-07-26 ENCOUNTER — Ambulatory Visit (INDEPENDENT_AMBULATORY_CARE_PROVIDER_SITE_OTHER): Payer: BLUE CROSS/BLUE SHIELD | Admitting: Emergency Medicine

## 2016-07-26 ENCOUNTER — Encounter: Payer: Self-pay | Admitting: Gastroenterology

## 2016-07-26 VITALS — BP 142/90 | HR 90 | Temp 98.2°F | Resp 18 | Ht 73.0 in | Wt 210.0 lb

## 2016-07-26 DIAGNOSIS — G4700 Insomnia, unspecified: Secondary | ICD-10-CM | POA: Diagnosis not present

## 2016-07-26 DIAGNOSIS — R232 Flushing: Secondary | ICD-10-CM | POA: Diagnosis not present

## 2016-07-26 DIAGNOSIS — I10 Essential (primary) hypertension: Secondary | ICD-10-CM

## 2016-07-26 MED ORDER — AMLODIPINE BESYLATE 10 MG PO TABS: 10.0000 mg | ORAL_TABLET | Freq: Every day | ORAL | 3 refills | Status: DC

## 2016-07-26 MED ORDER — HYDROXYZINE HCL 50 MG PO TABS: 50.0000 mg | ORAL_TABLET | Freq: Every evening | ORAL | 1 refills | Status: DC | PRN

## 2016-07-26 NOTE — Progress Notes (Signed)
Lynn Estrada 56 y.o.   Chief Complaint  Patient presents with  . Medication Problem    patient states since starting new bp medication she is having extreme hot flashes and lack of sleep.  . Insomnia    states it may be due to medication; up all night    HISTORY OF PRESENT ILLNESS: This is a 56 y.o. female complaining of unable to sleep with increased incidence of hot flashes since she was started on lisinopril/HCTZ for blood pressure. Was recently in [facility] with Pneumonia/Flu/Pyelonephritis/sepsis. No longer on antibiotics. No other complaints.  HPI   Prior to Admission medications   Medication Sig Start Date End Date Taking? Authorizing Provider  aspirin EC 81 MG tablet Take 81 mg by mouth daily.   Yes Historical Provider, MD  lisinopril-hydrochlorothiazide (PRINZIDE,ZESTORETIC) 20-12.5 MG tablet Take 1 tablet by mouth daily. 07/10/16  Yes Chelle Jeffery, PA-C  Multiple Vitamins-Minerals (MULTIVITAMIN ADULT PO) Take 1 tablet by mouth daily.   Yes Historical Provider, MD  Omeprazole 20 MG TBEC Take 1 tablet (20 mg total) by mouth daily. 30-60 mins before breakfast 07/20/16  Yes Lavone Nian Lemmon, PA  Na Sulfate-K Sulfate-Mg Sulf (SUPREP BOWEL PREP KIT) 17.5-3.13-1.6 GM/180ML SOLN Take 1 kit by mouth as directed. Patient not taking: Reported on 07/26/2016 07/20/16   Levin Erp, PA    Allergies  Allergen Reactions  . Bactrim [Sulfamethoxazole-Trimethoprim]     rash    Patient Active Problem List   Diagnosis Date Noted  . Anemia 07/10/2016  . Essential hypertension 07/26/2015  . Eczema 07/26/2015  . BMI 28.0-28.9,adult 07/26/2015    Past Medical History:  Diagnosis Date  . AKI (acute kidney injury) ([place]) 07/02/2016  . CAP (community acquired pneumonia) 07/06/2016  . Fibroid, uterine   . Hypertension   . Pyelonephritis 07/02/2016  . Severe sepsis ([place]) 07/02/2016    Past Surgical History:  Procedure Laterality Date  . ABDOMINAL HYSTERECTOMY     uterine  fibroids; ovaries remain  . BACK SURGERY  1996   lumbar  . CHOLECYSTECTOMY    . PARTIAL NEPHRECTOMY Right    due to large kidney stone    Social History   Social History  . Marital status: Married    Spouse name: Richard Espinola  . Number of children: 1  . 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 Smoking status: Former Smoker    Packs/day: 0.75    Years: 5.00    Types: Cigarettes  . Smokeless tobacco: Never Used     Comment: smoked back in her 30's  . Alcohol use 0.0 oz/week     Comment: social use; once a month  . Drug use: No  . Sexual activity: Yes    Partners: Male    Birth control/ protection: Surgical   Other Topics Concern  . Not on file   Social History Narrative   Lives with her husband and their daughter and their dog.    Family History  Problem Relation Age of Onset  . Hypertension Sister   . Eczema Sister   . Lupus Sister   . Sarcoidosis Mother   . Prostate cancer Father   . Gout Maternal Grandmother   . Diabetes Maternal Grandmother     leg amputations  . Breast cancer Maternal Aunt   . Stomach cancer Neg Hx   . Colon cancer Neg Hx      Review of Systems  Constitutional: Negative.  Negative for chills, diaphoresis and fever.  HENT: Negative.  Negative for congestion and nosebleeds.   Eyes: Negative.  Negative for blurred vision, discharge and redness.  Respiratory: Negative.  Negative for cough, hemoptysis, shortness of breath and wheezing.   Cardiovascular: Negative.  Negative for chest pain and palpitations.  Gastrointestinal: Negative.  Negative for abdominal pain, diarrhea, nausea and vomiting.  Genitourinary: Negative.  Negative for dysuria and hematuria.  Musculoskeletal: Negative.  Negative for myalgias and neck pain.  Skin: Negative.  Negative for itching and rash.       Hot flashes   Neurological: Negative.  Negative for dizziness, sensory change, focal weakness  and headaches.  Endo/Heme/Allergies: Negative.   Psychiatric/Behavioral: Negative for depression. The patient has insomnia.     Vitals:   07/26/16 1510  BP: (!) 142/90  Pulse: 90  Resp: 18  Temp: 98.2 F (36.8 C)    Physical Exam  Constitutional: She is oriented to person, place, and time. She appears well-developed and well-nourished.  HENT:  Head: Normocephalic and atraumatic.  Nose: Nose normal.  Mouth/Throat: Oropharynx is clear and moist. No oropharyngeal exudate.  Eyes: Conjunctivae and EOM are normal. Pupils are equal, round, and reactive to light.  Neck: Normal range of motion. Neck supple. No JVD present. No thyromegaly present.  Cardiovascular: Normal rate, regular rhythm and normal heart sounds.   Pulmonary/Chest: Effort normal and breath sounds normal.  Abdominal: Soft. Bowel sounds are normal. She exhibits no distension. There is no tenderness.  Musculoskeletal: Normal range of motion.  Lymphadenopathy:    She has no cervical adenopathy.  Neurological: She is alert and oriented to person, place, and time. No sensory deficit. She exhibits normal muscle tone.  Skin: Skin is warm and dry. Capillary refill takes less than 2 seconds.  Psychiatric: She has a normal mood and affect. Her behavior is normal.  Vitals reviewed.    ASSESSMENT & PLAN: Advised to stop Lisinopril/HCTZ and start Amlodipine 22m.  VKeziyahwas seen today for medication problem and insomnia.  Diagnoses and all orders for this visit:  Essential hypertension  Insomnia, unspecified type  Hot flashes  Other orders -     amLODipine (NORVASC) 10 MG tablet; Take 1 tablet (10 mg total) by mouth daily. -     hydrOXYzine (ATARAX/VISTARIL) 50 MG tablet; Take 1 tablet (50 mg total) by mouth at bedtime as needed.    Patient Instructions       IF you received an x-ray today, you will receive an invoice from [facility]. Please contact [facility] at [phone]  questions or concerns regarding your invoice.   IF you received labwork today, you will receive an invoice from [facility] Please contact [facility] at [phone] questions or concerns regarding your invoice.   Our billing staff will not be able to assist you with questions regarding bills from these companies.  You will be contacted with the lab results as soon as they are available. The fastest way to get your results is to activate your My Chart account. Instructions are located on the last page of this paperwork. If you have not heard from [place] results in 2 weeks, please contact this office.      Hypertension Hypertension is another name for high blood pressure. High blood pressure forces your heart to work harder to pump blood. A blood pressure reading has two numbers, which includes a higher number over a lower number (example: 110/72). Follow these instructions at home:  Have your blood pressure rechecked by your doctor.  Only take medicine as told by your doctor. Follow the directions carefully. The medicine does not work as well if you skip doses. Skipping doses also puts you at risk for problems.  Do not smoke.  Monitor your blood pressure at home as told by your doctor. Contact a doctor if:  You think you are having a reaction to the medicine you are taking.  You have repeat headaches or feel dizzy.  You have puffiness (swelling) in your ankles.  You have trouble with your vision. Get help right away if:  You get a very bad headache and are confused.  You feel weak, numb, or faint.  You get chest or belly (abdominal) pain.  You throw up (vomit).  You cannot breathe very well. This information is not intended to replace advice given to you by your health care provider. Make sure you discuss any questions you have with your health care provider. Document Released: 12/05/2007 Document Revised: 11/24/2015 Document Reviewed: 04/10/2013 Elsevier  Interactive Patient Education  2017 [place] and start Amlodipine 38m daily.Insomnia Insomnia is a sleep disorder that makes it difficult to fall asleep or to stay asleep. Insomnia can cause tiredness (fatigue), low energy, difficulty concentrating, mood swings, and poor performance at work or school. There are three different ways to classify insomnia:  Difficulty falling asleep.  Difficulty staying asleep.  Waking up too early in the morning. Any type of insomnia can be long-term (chronic) or short-term (acute). Both are common. Short-term insomnia usually lasts for three months or less. Chronic insomnia occurs at least three times a week for longer than three months. What are the causes? Insomnia may be caused by another condition, situation, or substance, such as:  Anxiety.  Certain medicines.  Gastroesophageal reflux disease (GERD) or other gastrointestinal conditions.  Asthma or other breathing conditions.  Restless legs syndrome, sleep apnea, or other sleep disorders.  Chronic pain.  Menopause. This may include hot flashes.  Stroke.  Abuse of alcohol, tobacco, or illegal drugs.  Depression.  Caffeine.  Neurological disorders, such as Alzheimer disease.  An overactive thyroid (hyperthyroidism). The cause of insomnia may not be known. What increases the risk? Risk factors for insomnia include:  Gender. Women are more commonly affected than men.  Age. Insomnia is more common as you get older.  Stress. This may involve your professional or personal life.  Income. Insomnia is more common in people with lower income.  Lack of exercise.  Irregular work schedule or night shifts.  Traveling between different time zones. What are the signs or symptoms? If you have insomnia, trouble falling asleep or trouble staying asleep is the main symptom. This may lead to other symptoms, such as:  Feeling fatigued.  Feeling nervous about going  to sleep.  Not feeling rested in the morning.  Having trouble concentrating.  Feeling irritable, anxious, or depressed. How is this treated? Treatment for insomnia depends on the cause. If your insomnia is caused by an underlying condition, treatment will focus on addressing the condition. Treatment may also include:  Medicines to help you sleep.  Counseling or therapy.  Lifestyle adjustments. Follow these instructions at home:  Take medicines only as directed by your health care provider.  Keep regular sleeping and waking hours. Avoid naps.  Keep a sleep diary to help you and your health care provider figure out what could be causing your insomnia. Include:  When you sleep.  When you wake  up during the night.  How well you sleep.  How rested you feel the next day.  Any side effects of medicines you are taking.  What you eat and drink.  Make your bedroom a comfortable place where it is easy to fall asleep:  Put up shades or special blackout curtains to block light from outside.  Use a white noise machine to block noise.  Keep the temperature cool.  Exercise regularly as directed by your health care provider. Avoid exercising right before bedtime.  Use relaxation techniques to manage stress. Ask your health care provider to suggest some techniques that may work well for you. These may include:  Breathing exercises.  Routines to release muscle tension.  Visualizing peaceful scenes.  Cut back on alcohol, caffeinated beverages, and cigarettes, especially close to bedtime. These can disrupt your sleep.  Do not overeat or eat spicy foods right before bedtime. This can lead to digestive discomfort that can make it hard for you to sleep.  Limit screen use before bedtime. This includes:  Watching TV.  Using your smartphone, tablet, and computer.  Stick to a routine. This can help you fall asleep faster. Try to do a quiet activity, brush your teeth, and go to bed  at the same time each night.  Get out of bed if you are still awake after 15 minutes of trying to sleep. Keep the lights down, but try reading or doing a quiet activity. When you feel sleepy, go back to bed.  Make sure that you drive carefully. Avoid driving if you feel very sleepy.  Keep all follow-up appointments as directed by your health care provider. This is important. Contact a health care provider if:  You are tired throughout the day or have trouble in your daily routine due to sleepiness.  You continue to have sleep problems or your sleep problems get worse. Get help right away if:  You have serious thoughts about hurting yourself or someone else. This information is not intended to replace advice given to you by your health care provider. Make sure you discuss any questions you have with your health care provider. Document Released: 06/15/2000 Document Revised: 11/18/2015 Document Reviewed: 03/19/2014 Elsevier Interactive Patient Education  2017 Elsevier Inc.    Agustina Caroli, MD [facility]

## 2016-07-26 NOTE — Patient Instructions (Addendum)
IF you received an x-ray today, you will receive an invoice from [facility]. Please contact [facility] at [phone] with questions or concerns regarding your invoice.   IF you received labwork today, you will receive an invoice from [place]. Please contact [facility] at [phone] with questions or concerns regarding your invoice.   Our billing staff will not be able to assist you with questions regarding bills from these companies.  You will be contacted with the lab results as soon as they are available. The fastest way to get your results is to activate your My Chart account. Instructions are located on the last page of this paperwork. If you have not heard from [place] regarding the results in 2 weeks, please contact this office.      Hypertension Hypertension is another name for high blood pressure. High blood pressure forces your heart to work harder to pump blood. A blood pressure reading has two numbers, which includes a higher number over a lower number (example: 110/72). Follow these instructions at home:  Have your blood pressure rechecked by your doctor.  Only take medicine as told by your doctor. Follow the directions carefully. The medicine does not work as well if you skip doses. Skipping doses also puts you at risk for problems.  Do not smoke.  Monitor your blood pressure at home as told by your doctor. Contact a doctor if:  You think you are having a reaction to the medicine you are taking.  You have repeat headaches or feel dizzy.  You have puffiness (swelling) in your ankles.  You have trouble with your vision. Get help right away if:  You get a very bad headache and are confused.  You feel weak, numb, or faint.  You get chest or belly (abdominal) pain.  You throw up (vomit).  You cannot breathe very well. This information is not intended to replace advice given to you by your health care provider. Make sure you discuss any  questions you have with your health care provider. Document Released: 12/05/2007 Document Revised: 11/24/2015 Document Reviewed: 04/10/2013 Elsevier Interactive Patient Education  2017 [place] and start Amlodipine 10mg  daily.Insomnia Insomnia is a sleep disorder that makes it difficult to fall asleep or to stay asleep. Insomnia can cause tiredness (fatigue), low energy, difficulty concentrating, mood swings, and poor performance at work or school. There are three different ways to classify insomnia:  Difficulty falling asleep.  Difficulty staying asleep.  Waking up too early in the morning. Any type of insomnia can be long-term (chronic) or short-term (acute). Both are common. Short-term insomnia usually lasts for three months or less. Chronic insomnia occurs at least three times a week for longer than three months. What are the causes? Insomnia may be caused by another condition, situation, or substance, such as:  Anxiety.  Certain medicines.  Gastroesophageal reflux disease (GERD) or other gastrointestinal conditions.  Asthma or other breathing conditions.  Restless legs syndrome, sleep apnea, or other sleep disorders.  Chronic pain.  Menopause. This may include hot flashes.  Stroke.  Abuse of alcohol, tobacco, or illegal drugs.  Depression.  Caffeine.  Neurological disorders, such as Alzheimer disease.  An overactive thyroid (hyperthyroidism). The cause of insomnia may not be known. What increases the risk? Risk factors for insomnia include:  Gender. Women are more commonly affected than men.  Age. Insomnia is more common as you get older.  Stress. This may involve your professional or personal life.  Income. Insomnia  is more common in people with lower income.  Lack of exercise.  Irregular work schedule or night shifts.  Traveling between different time zones. What are the signs or symptoms? If you have insomnia, trouble  falling asleep or trouble staying asleep is the main symptom. This may lead to other symptoms, such as:  Feeling fatigued.  Feeling nervous about going to sleep.  Not feeling rested in the morning.  Having trouble concentrating.  Feeling irritable, anxious, or depressed. How is this treated? Treatment for insomnia depends on the cause. If your insomnia is caused by an underlying condition, treatment will focus on addressing the condition. Treatment may also include:  Medicines to help you sleep.  Counseling or therapy.  Lifestyle adjustments. Follow these instructions at home:  Take medicines only as directed by your health care provider.  Keep regular sleeping and waking hours. Avoid naps.  Keep a sleep diary to help you and your health care provider figure out what could be causing your insomnia. Include:  When you sleep.  When you wake up during the night.  How well you sleep.  How rested you feel the next day.  Any side effects of medicines you are taking.  What you eat and drink.  Make your bedroom a comfortable place where it is easy to fall asleep:  Put up shades or special blackout curtains to block light from outside.  Use a white noise machine to block noise.  Keep the temperature cool.  Exercise regularly as directed by your health care provider. Avoid exercising right before bedtime.  Use relaxation techniques to manage stress. Ask your health care provider to suggest some techniques that may work well for you. These may include:  Breathing exercises.  Routines to release muscle tension.  Visualizing peaceful scenes.  Cut back on alcohol, caffeinated beverages, and cigarettes, especially close to bedtime. These can disrupt your sleep.  Do not overeat or eat spicy foods right before bedtime. This can lead to digestive discomfort that can make it hard for you to sleep.  Limit screen use before bedtime. This includes:  Watching TV.  Using your  smartphone, tablet, and computer.  Stick to a routine. This can help you fall asleep faster. Try to do a quiet activity, brush your teeth, and go to bed at the same time each night.  Get out of bed if you are still awake after 15 minutes of trying to sleep. Keep the lights down, but try reading or doing a quiet activity. When you feel sleepy, go back to bed.  Make sure that you drive carefully. Avoid driving if you feel very sleepy.  Keep all follow-up appointments as directed by your health care provider. This is important. Contact a health care provider if:  You are tired throughout the day or have trouble in your daily routine due to sleepiness.  You continue to have sleep problems or your sleep problems get worse. Get help right away if:  You have serious thoughts about hurting yourself or someone else. This information is not intended to replace advice given to you by your health care provider. Make sure you discuss any questions you have with your health care provider. Document Released: 06/15/2000 Document Revised: 11/18/2015 Document Reviewed: 03/19/2014 Elsevier Interactive Patient Education  2017 [employer].

## 2016-08-01 ENCOUNTER — Ambulatory Visit (INDEPENDENT_AMBULATORY_CARE_PROVIDER_SITE_OTHER): Payer: BLUE CROSS/BLUE SHIELD | Admitting: Physician Assistant

## 2016-08-01 VITALS — BP 140/80 | HR 98 | Temp 98.2°F | Ht 73.0 in | Wt 212.0 lb

## 2016-08-01 DIAGNOSIS — I10 Essential (primary) hypertension: Secondary | ICD-10-CM | POA: Diagnosis not present

## 2016-08-01 DIAGNOSIS — L309 Dermatitis, unspecified: Secondary | ICD-10-CM

## 2016-08-01 DIAGNOSIS — L299 Pruritus, unspecified: Secondary | ICD-10-CM

## 2016-08-01 MED ORDER — TRIAMCINOLONE ACETONIDE 0.025 % EX OINT: 1.0000 "application " | TOPICAL_OINTMENT | Freq: Two times a day (BID) | CUTANEOUS | 0 refills | Status: DC

## 2016-08-01 MED ORDER — HYDROCHLOROTHIAZIDE 25 MG PO TABS: 25.0000 mg | ORAL_TABLET | Freq: Every day | ORAL | 3 refills | Status: DC

## 2016-08-01 NOTE — Patient Instructions (Addendum)
STOP using the [facility], as it can dry out the skin and aggravate the itching.  STOP the amlodipine.  START the HCTZ tomorrow morning.  Take an OTC oral antihistamine (Claritin, Allegra, Zyrtec) each morning. You can use the hydroxyzine (Atarax) at bedtime for itching.  Drink at least 64 ounces of water daily. Try to stay cool and dry. Getting hot and sweaty will increase the itching. Consider a humidifier for the room where you sleep. Bathe once daily. Avoid using HOT water, as it dries skin. Avoid deodorant soaps (Dial is the worst!) and stick with gentle cleansers (I like Cetaphil Liquid Cleanser). After bathing, dry off completely, then apply a thick emollient cream (I like Cetaphil Moisturizing Cream). Cetaphil also has a nice facial moisturizer, though the Cream is fine to use on your face. Apply the cream twice daily, or more!     IF you received an x-ray today, you will receive an invoice from [facility]. Please contact [facility] at [phone] with questions or concerns regarding your invoice.   IF you received labwork today, you will receive an invoice from [place]. Please contact [facility] at [phone] with questions or concerns regarding your invoice.   Our billing staff will not be able to assist you with questions regarding bills from these companies.  You will be contacted with the lab results as soon as they are available. The fastest way to get your results is to activate your My Chart account. Instructions are located on the last page of this paperwork. If you have not heard from [place] regarding the results in 2 weeks, please contact this office.

## 2016-08-01 NOTE — Telephone Encounter (Signed)
Patient is in the office now.

## 2016-08-01 NOTE — Progress Notes (Signed)
Patient ID: Lynn Estrada, female    DOB: [birth year], 56 y.o.   MRN: [id number]  PCP: [name], PA-C  Chief Complaint  Patient presents with  . Itching    X 2 days - eyes ,neck, and mouth    Subjective:   Presents for evaluation of itching and rash.  She was seen here 1/25 by my colleague, reporting reaction to the increased dose of lisinopril HCTZ. She related that it was causing her to feel flushed and unable to sleep. She was advised that it was highly unlikely that the increased dose was causing her symptoms, but she insisted, and her regimen for HTN was changed to amlodipine 10 mg.  She reports that the flushing and insomnia are now resolved, but that within hours of the first dose of amlodipine, she developed itchy rash on the back of the neck and around her mouth and swelling under the RIGHT eye. She contacted the office and was advised to stop the amlodipine and come in for re-evaluation.  Denies SOB, chest tightness, palpitations, dizziness. No nausea, vomiting. No swelling of the tongue or throat. No cough or throat itching, throat clearing. No rash or itching other than on the back of the neck and around her mouth.   Review of Systems  As above.   Patient Active Problem List   Diagnosis Date Noted  . Anemia 07/10/2016  . Essential hypertension 07/26/2015  . Eczema 07/26/2015  . BMI 28.0-28.9,adult 07/26/2015     Prior to Admission medications   Medication Sig Start Date End Date Taking? Authorizing Provider  amLODipine (NORVASC) 10 MG tablet Take 1 tablet (10 mg total) by mouth daily. 07/26/16  Yes Miguel Joellen Jersey, MD  aspirin EC 81 MG tablet Take 81 mg by mouth daily.   Yes Historical Provider, MD  hydrOXYzine (ATARAX/VISTARIL) 50 MG tablet Take 1 tablet (50 mg total) by mouth at bedtime as needed. Patient not taking: Reported on 08/01/2016 07/26/16   Horald Pollen, MD  Multiple Vitamins-Minerals (MULTIVITAMIN ADULT PO) Take 1 tablet  by mouth daily.    Historical Provider, MD  Omeprazole 20 MG TBEC Take 1 tablet (20 mg total) by mouth daily. 30-60 mins before breakfast Patient not taking: Reported on 08/01/2016 07/20/16   Levin Erp, PA     Allergies  Allergen Reactions  . Bactrim [Sulfamethoxazole-Trimethoprim]     rash       Objective:  Physical Exam  Constitutional: She is oriented to person, place, and time. She appears well-developed and well-nourished. She is active and cooperative. No distress.  BP 140/80   Pulse 98   Temp 98.2 F (36.8 C) (Oral)   Ht 6\' 1"  (1.854 m)   Wt 212 lb (96.2 kg)   SpO2 97%   BMI 27.97 kg/m   HENT:  Head: Normocephalic and atraumatic.  Right Ear: Hearing normal.  Left Ear: Hearing normal.  Eyes: Conjunctivae are normal. No scleral icterus.  Neck: Normal range of motion. Neck supple. No thyromegaly present.  Cardiovascular: Normal rate, regular rhythm and normal heart sounds.   Pulses:      Radial pulses are 2+ on the right side, and 2+ on the left side.  Pulmonary/Chest: Effort normal and breath sounds normal.  Lymphadenopathy:       Head (right side): No tonsillar, no preauricular, no posterior auricular and no occipital adenopathy present.       Head (left side): No tonsillar, no preauricular, no posterior auricular and no  occipital adenopathy present.    She has no cervical adenopathy.       Right: No supraclavicular adenopathy present.       Left: No supraclavicular adenopathy present.  Neurological: She is alert and oriented to person, place, and time. No sensory deficit.  Skin: Skin is warm, dry and intact. No rash noted. No cyanosis or erythema. Nails show no clubbing.  normopigmented papules noted on the back of the neck and on the chin. No visible lesions on the lips. Moderate erythema and edema below the RIGHT eye.  Psychiatric: She has a normal mood and affect. Her speech is normal and behavior is normal.           Assessment & Plan:   1.  Pruritus 2. Dermatitis Stop amlodipine, though it is unlikely to have caused this. She specifically wants a cream, so low-dose hydrocortisone prescribed, with warning regarding use on her face. Use OTC non-sedating oral antihistamine QAM and the hydroxyzine she was given 1/25 at [facility] as needed. Dry skin care recommendations. If symptoms persist, re-evaluate, as systemic steroids may be warranted, but hold off now due to HTN. If develops symptoms in her mouth or respiratory symptoms, she is advised to call 911. - triamcinolone (KENALOG) 0.025 % ointment; Apply 1 application topically 2 (two) times daily.  Dispense: 15 g; Refill: 0  3. Benign essential HTN She previously tolerated HCTZ alone, so restart that. Advised that it is unlikely that the amlodipine is the cause of the current symptoms, and it may be appropriate to retry it in the future. - hydrochlorothiazide (HYDRODIURIL) 25 MG tablet; Take 1 tablet (25 mg total) by mouth daily.  Dispense: 90 tablet; Refill: 3   Fara Chute, PA-C Physician Assistant-Certified [facility]

## 2016-08-01 NOTE — Telephone Encounter (Signed)
tx up front for appt. And will only come in at 330 with chelle because she is at work, we explained the need for appt. This morinng and she refuses to miss work.

## 2016-08-01 NOTE — Progress Notes (Deleted)
     Patient ID: Lynn Estrada, female    DOB: [birth year], 56 y.o.   MRN: [id number]  PCP: [name], PA-C  Chief Complaint  Patient presents with  . Itching    X 2 days - eyes ,neck, and mouth    Subjective:   Presents for evaluation of ***.  ***. Itching Monday. Hasn't taken anything today.    Review of Systems     Patient Active Problem List   Diagnosis Date Noted  . Anemia 07/10/2016  . Essential hypertension 07/26/2015  . Eczema 07/26/2015  . BMI 28.0-28.9,adult 07/26/2015     Prior to Admission medications   Medication Sig Start Date End Date Taking? Authorizing Provider  amLODipine (NORVASC) 10 MG tablet Take 1 tablet (10 mg total) by mouth daily. 07/26/16  Yes Miguel Joellen Jersey, MD  aspirin EC 81 MG tablet Take 81 mg by mouth daily.   Yes Historical Provider, MD  hydrOXYzine (ATARAX/VISTARIL) 50 MG tablet Take 1 tablet (50 mg total) by mouth at bedtime as needed. Patient not taking: Reported on 08/01/2016 07/26/16   Horald Pollen, MD  Multiple Vitamins-Minerals (MULTIVITAMIN ADULT PO) Take 1 tablet by mouth daily.    Historical Provider, MD  Na Sulfate-K Sulfate-Mg Sulf (SUPREP BOWEL PREP KIT) 17.5-3.13-1.6 GM/180ML SOLN Take 1 kit by mouth as directed. Patient not taking: Reported on 08/01/2016 07/20/16   Levin Erp, PA  Omeprazole 20 MG TBEC Take 1 tablet (20 mg total) by mouth daily. 30-60 mins before breakfast Patient not taking: Reported on 08/01/2016 07/20/16   Levin Erp, PA     Allergies  Allergen Reactions  . Bactrim [Sulfamethoxazole-Trimethoprim]     rash       Objective:  Physical Exam         Assessment & Plan:   ***

## 2016-08-01 NOTE — Telephone Encounter (Signed)
Pt called back today and states on new med she is having a rash on neck and some facial swelling today, I advised stopped med and come in now for an ov Pt. Denies sob or throat/mouth swelling.

## 2016-08-03 ENCOUNTER — Ambulatory Visit (AMBULATORY_SURGERY_CENTER): Payer: BLUE CROSS/BLUE SHIELD | Admitting: Gastroenterology

## 2016-08-03 ENCOUNTER — Encounter: Payer: Self-pay | Admitting: Gastroenterology

## 2016-08-03 VITALS — BP 123/87 | HR 77 | Temp 97.3°F | Resp 13 | Ht 73.0 in | Wt 212.0 lb

## 2016-08-03 DIAGNOSIS — D123 Benign neoplasm of transverse colon: Secondary | ICD-10-CM

## 2016-08-03 DIAGNOSIS — D125 Benign neoplasm of sigmoid colon: Secondary | ICD-10-CM

## 2016-08-03 DIAGNOSIS — Z860101 Personal history of adenomatous and serrated colon polyps: Secondary | ICD-10-CM

## 2016-08-03 DIAGNOSIS — D509 Iron deficiency anemia, unspecified: Secondary | ICD-10-CM | POA: Diagnosis not present

## 2016-08-03 DIAGNOSIS — K2950 Unspecified chronic gastritis without bleeding: Secondary | ICD-10-CM | POA: Diagnosis not present

## 2016-08-03 DIAGNOSIS — K5900 Constipation, unspecified: Secondary | ICD-10-CM

## 2016-08-03 DIAGNOSIS — K2990 Gastroduodenitis, unspecified, without bleeding: Secondary | ICD-10-CM

## 2016-08-03 DIAGNOSIS — D120 Benign neoplasm of cecum: Secondary | ICD-10-CM

## 2016-08-03 DIAGNOSIS — D649 Anemia, unspecified: Secondary | ICD-10-CM | POA: Diagnosis not present

## 2016-08-03 DIAGNOSIS — K2970 Gastritis, unspecified, without bleeding: Secondary | ICD-10-CM

## 2016-08-03 DIAGNOSIS — Z8719 Personal history of other diseases of the digestive system: Secondary | ICD-10-CM

## 2016-08-03 DIAGNOSIS — K635 Polyp of colon: Secondary | ICD-10-CM

## 2016-08-03 DIAGNOSIS — Z86010 Personal history of colonic polyps: Secondary | ICD-10-CM

## 2016-08-03 HISTORY — DX: Personal history of other diseases of the digestive system: Z87.19

## 2016-08-03 HISTORY — DX: Personal history of adenomatous and serrated colon polyps: Z86.0101

## 2016-08-03 MED ORDER — SODIUM CHLORIDE 0.9 % IV SOLN: 500.0000 mL | INTRAVENOUS | Status: DC

## 2016-08-03 NOTE — Patient Instructions (Signed)
YOU HAD AN ENDOSCOPIC PROCEDURE TODAY AT [facility]:   Refer to the procedure report that was given to you for any specific questions about what was found during the examination.  If the procedure report does not answer your questions, please call your gastroenterologist to clarify.  If you requested that your care partner not be given the details of your procedure findings, then the procedure report has been included in a sealed envelope for you to review at your convenience later.  YOU SHOULD EXPECT: Some feelings of bloating in the abdomen. Passage of more gas than usual.  Walking can help get rid of the air that was put into your GI tract during the procedure and reduce the bloating. If you had a lower endoscopy (such as a colonoscopy or flexible sigmoidoscopy) you may notice spotting of blood in your stool or on the toilet paper. If you underwent a bowel prep for your procedure, you may not have a normal bowel movement for a few days.  Please Note:  You might notice some irritation and congestion in your nose or some drainage.  This is from the oxygen used during your procedure.  There is no need for concern and it should clear up in a day or so.  SYMPTOMS TO REPORT IMMEDIATELY:   Following lower endoscopy (colonoscopy or flexible sigmoidoscopy):  Excessive amounts of blood in the stool  Significant tenderness or worsening of abdominal pains  Swelling of the abdomen that is new, acute  Fever of 100F or higher   Following upper endoscopy (EGD)  Vomiting of blood or coffee ground material  New chest pain or pain under the shoulder blades  Painful or persistently difficult swallowing  New shortness of breath  Fever of 100F or higher  Black, tarry-looking stools  For urgent or emergent issues, a gastroenterologist can be reached at any hour by calling [phone].   DIET:  We do recommend a small meal at first, but then you may proceed to your regular diet.  Drink  plenty of fluids but you should avoid alcoholic beverages for 24 hours.  ACTIVITY:  You should plan to take it easy for the rest of today and you should NOT DRIVE or use heavy machinery until tomorrow (because of the sedation medicines used during the test).    FOLLOW UP: Our staff will call the number listed on your records the next business day following your procedure to check on you and address any questions or concerns that you may have regarding the information given to you following your procedure. If we do not reach you, we will leave a message.  However, if you are feeling well and you are not experiencing any problems, there is no need to return our call.  We will assume that you have returned to your regular daily activities without incident.  If any biopsies were taken you will be contacted by phone or by letter within the next 1-3 weeks.  Please call us at [phone] if you have not heard about the biopsies in 3 weeks.   SIGNATURES/CONFIDENTIALITY: You and/or your care partner have signed paperwork which will be entered into your electronic medical record.  These signatures attest to the fact that that the information above on your After Visit Summary has been reviewed and is understood.  Full responsibility of the confidentiality of this discharge information lies with you and/or your care-partner.  Await pathology  Please read over handouts about polyps and gastritis  Please continue your normal medications

## 2016-08-03 NOTE — Progress Notes (Signed)
Called to room to assist during endoscopic procedure.  Patient ID and intended procedure confirmed with present staff. Received instructions for my participation in the procedure from the performing physician.  

## 2016-08-03 NOTE — Progress Notes (Signed)
To PACU Pt awake and alert. Report to RN 

## 2016-08-03 NOTE — Op Note (Signed)
[place] Patient Name: Lynn Estrada Procedure Date: 08/03/2016 2:39 PM MRN: [id number] Endoscopist: Milus Banister , MD Age: 56 Referring MD:  Date of Birth: 02-Apr-1961 Gender: Female Account #: [id number] Procedure:                Upper GI endoscopy Indications:              Iron deficiency anemia, Heartburn Medicines:                Monitored Anesthesia Care Procedure:                Pre-Anesthesia Assessment:                           - Prior to the procedure, a History and Physical                            was performed, and patient medications and                            allergies were reviewed. The patient's tolerance of                            previous anesthesia was also reviewed. The risks                            and benefits of the procedure and the sedation                            options and risks were discussed with the patient.                            All questions were answered, and informed consent                            was obtained. Prior Anticoagulants: The patient has                            taken no previous anticoagulant or antiplatelet                            agents. ASA Grade Assessment: II - A patient with                            mild systemic disease. After reviewing the risks                            and benefits, the patient was deemed in                            satisfactory condition to undergo the procedure.                           - Prior to the procedure, a History and Physical  was performed, and patient medications and                            allergies were reviewed. The patient's tolerance of                            previous anesthesia was also reviewed. The risks                            and benefits of the procedure and the sedation                            options and risks were discussed with the patient.                            All questions were  answered, and informed consent                            was obtained. Prior Anticoagulants: The patient has                            taken no previous anticoagulant or antiplatelet                            agents. ASA Grade Assessment: II - A patient with                            mild systemic disease. After reviewing the risks                            and benefits, the patient was deemed in                            satisfactory condition to undergo the procedure.                           After obtaining informed consent, the endoscope was                            passed under direct vision. Throughout the                            procedure, the patient's blood pressure, pulse, and                            oxygen saturations were monitored continuously. The                            Model GIF-HQ190 [id number]) scope was introduced                            through the mouth, and advanced to the second part  of duodenum. The upper GI endoscopy was                            accomplished without difficulty. The patient                            tolerated the procedure well. Scope In: Scope Out: Findings:                 Mild inflammation characterized by friability and                            granularity was found in the entire examined                            stomach. Biopsies were taken with a cold forceps                            for histology.                           The exam was otherwise without abnormality. Complications:            No immediate complications. Estimated blood loss:                            None. Estimated Blood Loss:     Estimated blood loss: none. Impression:               - Gastritis. Biopsied for H. pylori                           - The examination was otherwise normal. Recommendation:           - Patient has a contact number available for                            emergencies. The signs and symptoms  of potential                            delayed complications were discussed with the                            patient. Return to normal activities tomorrow.                            Written discharge instructions were provided to the                            patient.                           - Resume previous diet.                           - Continue present medications.                           -  Await pathology results.                           - Your anemia and GI difficulties recently were                            probably all related to your severe acute illness                            (UTI, pneumonia, Flu). Will likely ask that you get                            repeat labs (CBC) in 4-5 weeks from now, pending                            the results of the biopsies from this exam and                            todays colonoscopy. Milus Banister, MD 08/03/2016 2:51:27 PM This report has been signed electronically.

## 2016-08-03 NOTE — Op Note (Signed)
[place] Patient Name: Lynn Estrada Procedure Date: 08/03/2016 2:15 PM MRN: [id number] Endoscopist: Milus Banister , MD Age: 56 Referring MD:  Date of Birth: 1960/10/03 Gender: Female Account #: [id number] Procedure:                Colonoscopy Indications:              Iron deficiency anemia Medicines:                Monitored Anesthesia Care Procedure:                Pre-Anesthesia Assessment:                           - Prior to the procedure, a History and Physical                            was performed, and patient medications and                            allergies were reviewed. The patient's tolerance of                            previous anesthesia was also reviewed. The risks                            and benefits of the procedure and the sedation                            options and risks were discussed with the patient.                            All questions were answered, and informed consent                            was obtained. Prior Anticoagulants: The patient has                            taken no previous anticoagulant or antiplatelet                            agents. ASA Grade Assessment: II - A patient with                            mild systemic disease. After reviewing the risks                            and benefits, the patient was deemed in                            satisfactory condition to undergo the procedure.                           After obtaining informed consent, the colonoscope  was passed under direct vision. Throughout the                            procedure, the patient's blood pressure, pulse, and                            oxygen saturations were monitored continuously. The                            Model CF-HQ190L [id number]) scope was introduced                            through the anus and advanced to the the cecum,                            identified by appendiceal orifice  and ileocecal                            valve. The colonoscopy was performed without                            difficulty. The patient tolerated the procedure                            well. The quality of the bowel preparation was                            good. The ileocecal valve, appendiceal orifice, and                            rectum were photographed. Scope In: 2:20:25 PM Scope Out: 2:34:36 PM Scope Withdrawal Time: 0 hours 10 minutes 45 seconds  Total Procedure Duration: 0 hours 14 minutes 11 seconds  Findings:                 Four sessile polyps were found in the sigmoid                            colon, transverse colon and cecum. The polyps were                            1 to 6 mm in size. These polyps were removed with a                            cold snare. Resection was complete, but the polyp                            tissue was only partially retrieved.                           The exam was otherwise without abnormality on                            direct and retroflexion views.  Complications:            No immediate complications. Estimated blood loss:                            None. Estimated Blood Loss:     Estimated blood loss: none. Impression:               - Four 1 to 6 mm polyps in the sigmoid colon, in                            the transverse colon and in the cecum, removed with                            a cold snare. Complete resection. Partial retrieval.                           - The examination was otherwise normal on direct                            and retroflexion views. Recommendation:           - Patient has a contact number available for                            emergencies. The signs and symptoms of potential                            delayed complications were discussed with the                            patient. Return to normal activities tomorrow.                            Written discharge instructions were provided to the                             patient.                           - Resume previous diet.                           - Continue present medications.                           You will receive a letter within 2-3 weeks with the                            pathology results and my final recommendations.                           If the polyp(s) is proven to be 'pre-cancerous' on                            pathology, you will need repeat colonoscopy in 5  years. If the polyp(s) is NOT 'precancerous' on                            pathology then you should repeat colon cancer                            screening in 10 years with colonoscopy without need                            for colon cancer screening by any method prior to                            then (including stool testing). Milus Banister, MD 08/03/2016 2:38:00 PM This report has been signed electronically.

## 2016-08-06 ENCOUNTER — Telehealth: Payer: Self-pay

## 2016-08-06 NOTE — Telephone Encounter (Signed)
  Follow up Call-  Call back number 08/03/2016  Post procedure Call Back phone  # [phone]  Permission to leave phone message Yes  Some recent data might be hidden     Patient was called for follow up after her procedure on 08/03/2016. No answer at the number given for follow up phone call. A message was left on the answering machine.

## 2016-08-06 NOTE — Telephone Encounter (Signed)
  Follow up Call-  Call back number 08/03/2016  Post procedure Call Back phone  # [phone]  Permission to leave phone message Yes  Some recent data might be hidden    Patient was called for follow up after her procedure on 08/03/2016. No answer at the number given for follow up phone call. A message was left on the answering machine.

## 2016-08-07 ENCOUNTER — Telehealth: Payer: Self-pay

## 2016-08-07 NOTE — Telephone Encounter (Signed)
I received a voice mail from the pt stating that everything was going fine and she did not need a return call.

## 2016-08-07 NOTE — Telephone Encounter (Signed)
Left message on machine to call back  

## 2016-08-07 NOTE — Telephone Encounter (Signed)
-----   Message from Levin Erp, [place] sent at 08/07/2016  8:49 AM EST ----- Regarding: Omeprazole I received a letter telling me patient's insurance doesn't cover Omperazole 20mg , can you call patient and see if she is still having reflux issues? She can buy the omeprazole in bulk cheaper OTC at [facility] if she is able, or we can try to send in a different low dose qd PPI.  Thanks-JLL

## 2016-08-14 ENCOUNTER — Ambulatory Visit: Payer: BLUE CROSS/BLUE SHIELD | Admitting: Physician Assistant

## 2016-08-15 ENCOUNTER — Other Ambulatory Visit: Payer: Self-pay

## 2016-08-15 DIAGNOSIS — D509 Iron deficiency anemia, unspecified: Secondary | ICD-10-CM

## 2016-08-21 ENCOUNTER — Telehealth: Payer: Self-pay

## 2016-08-21 NOTE — Telephone Encounter (Signed)
Patient needs forms filled out for [facility] for her 07/10/16 OV with Chelle. I have highlighted the areas that need to be completed. I will place the forms in your box on 08/21/16 please return them to the FMLA/Disability box at the 102 checkout desk within 5-7 business day. Thank you!

## 2016-08-21 NOTE — Telephone Encounter (Signed)
Pt wants to check on the paper work for her claim she has with [facility]   Please advise: [phone]

## 2016-08-22 NOTE — Telephone Encounter (Signed)
Please call this patient to clarify the forms she dropped off.  When I saw her on 07/10/2016, I wrote fro her to [employer]W on 07/16/2016 and limited her to seated work x 4 weeks.  I saw her again on 08/01/2016, and she was doing well.  Is this form to document her time away from work for her hospitalization and recovery, or is this for something else?

## 2016-08-23 NOTE — Telephone Encounter (Signed)
Form completed and returned to FMLA/Disability

## 2016-08-23 NOTE — Telephone Encounter (Signed)
It is for her time away from work (claim denied) From hospitalization to release back to work for regular duty 08/13/16 (4 weeks from last visit restriction)

## 2016-08-24 NOTE — Telephone Encounter (Signed)
Paperwork scanned and faxed on 08/24/16

## 2016-09-07 ENCOUNTER — Telehealth: Payer: Self-pay | Admitting: Physician Assistant

## 2016-09-07 NOTE — Telephone Encounter (Signed)
[employer] needs forms completed for STD by [employer]le. I have completed what I could from the [place] notes and highlighted the areas that need to be finished I will place the forms in your box on 09/07/16. Please return them to the FMLA/Disability box at the [employer] checkout desk within 5-7 business days. Thank you!

## 2016-09-10 NOTE — Telephone Encounter (Signed)
Hold for Chelle.

## 2016-09-11 NOTE — Telephone Encounter (Signed)
Forms completed and returned to the FMLA/Disability box at [employer] checkout

## 2016-12-31 ENCOUNTER — Emergency Department (HOSPITAL_COMMUNITY): Payer: BLUE CROSS/BLUE SHIELD

## 2016-12-31 ENCOUNTER — Emergency Department (HOSPITAL_COMMUNITY): Payer: BLUE CROSS/BLUE SHIELD | Admitting: Certified Registered"

## 2016-12-31 ENCOUNTER — Encounter (HOSPITAL_COMMUNITY)
Admission: EM | Disposition: A | Discharge status: Home / Self Care | Payer: Self-pay | Source: Home / Self Care | Attending: Urology

## 2016-12-31 ENCOUNTER — Encounter (HOSPITAL_COMMUNITY): Payer: Self-pay | Admitting: Emergency Medicine

## 2016-12-31 ENCOUNTER — Inpatient Hospital Stay (HOSPITAL_COMMUNITY)
Admission: EM | Admit: 2016-12-31 | Discharge: 2017-01-02 | DRG: 872 | Disposition: A | Discharge status: Home / Self Care | Payer: BLUE CROSS/BLUE SHIELD | Attending: Urology | Admitting: Urology

## 2016-12-31 DIAGNOSIS — A419 Sepsis, unspecified organism: Secondary | ICD-10-CM | POA: Diagnosis not present

## 2016-12-31 DIAGNOSIS — R000 Tachycardia, unspecified: Secondary | ICD-10-CM | POA: Diagnosis not present

## 2016-12-31 DIAGNOSIS — A4189 Other specified sepsis: Principal | ICD-10-CM | POA: Diagnosis present

## 2016-12-31 DIAGNOSIS — Z7982 Long term (current) use of aspirin: Secondary | ICD-10-CM

## 2016-12-31 DIAGNOSIS — Z87442 Personal history of urinary calculi: Secondary | ICD-10-CM | POA: Diagnosis not present

## 2016-12-31 DIAGNOSIS — Z419 Encounter for procedure for purposes other than remedying health state, unspecified: Secondary | ICD-10-CM

## 2016-12-31 DIAGNOSIS — I119 Hypertensive heart disease without heart failure: Secondary | ICD-10-CM | POA: Diagnosis present

## 2016-12-31 DIAGNOSIS — R160 Hepatomegaly, not elsewhere classified: Secondary | ICD-10-CM | POA: Diagnosis not present

## 2016-12-31 DIAGNOSIS — Z79899 Other long term (current) drug therapy: Secondary | ICD-10-CM

## 2016-12-31 DIAGNOSIS — N201 Calculus of ureter: Secondary | ICD-10-CM | POA: Diagnosis not present

## 2016-12-31 DIAGNOSIS — N390 Urinary tract infection, site not specified: Secondary | ICD-10-CM | POA: Diagnosis present

## 2016-12-31 DIAGNOSIS — D649 Anemia, unspecified: Secondary | ICD-10-CM | POA: Diagnosis not present

## 2016-12-31 DIAGNOSIS — Z87891 Personal history of nicotine dependence: Secondary | ICD-10-CM

## 2016-12-31 DIAGNOSIS — R509 Fever, unspecified: Secondary | ICD-10-CM | POA: Diagnosis not present

## 2016-12-31 DIAGNOSIS — I517 Cardiomegaly: Secondary | ICD-10-CM | POA: Diagnosis not present

## 2016-12-31 DIAGNOSIS — I700 Atherosclerosis of aorta: Secondary | ICD-10-CM | POA: Diagnosis present

## 2016-12-31 DIAGNOSIS — N132 Hydronephrosis with renal and ureteral calculous obstruction: Secondary | ICD-10-CM | POA: Diagnosis not present

## 2016-12-31 DIAGNOSIS — I10 Essential (primary) hypertension: Secondary | ICD-10-CM | POA: Diagnosis not present

## 2016-12-31 DIAGNOSIS — N1330 Unspecified hydronephrosis: Secondary | ICD-10-CM | POA: Diagnosis not present

## 2016-12-31 HISTORY — PX: CYSTOSCOPY W/ URETERAL STENT PLACEMENT: SHX1429

## 2016-12-31 LAB — CBC WITH DIFFERENTIAL/PLATELET
BASOS PCT: 0 %
Basophils Absolute: 0 10*3/uL (ref 0.0–0.1)
Eosinophils Absolute: 0 10*3/uL (ref 0.0–0.7)
Eosinophils Relative: 0 %
HEMATOCRIT: 34.5 % — AB (ref 36.0–46.0)
HEMOGLOBIN: 11.6 g/dL — AB (ref 12.0–15.0)
Lymphocytes Relative: 9 %
Lymphs Abs: 0.9 10*3/uL (ref 0.7–4.0)
MCH: 27.9 pg (ref 26.0–34.0)
MCHC: 33.6 g/dL (ref 30.0–36.0)
MCV: 82.9 fL (ref 78.0–100.0)
MONOS PCT: 5 %
Monocytes Absolute: 0.4 10*3/uL (ref 0.1–1.0)
NEUTROS ABS: 7.8 10*3/uL — AB (ref 1.7–7.7)
NEUTROS PCT: 86 %
Platelets: 268 10*3/uL (ref 150–400)
RBC: 4.16 MIL/uL (ref 3.87–5.11)
RDW: 14.3 % (ref 11.5–15.5)
WBC: 9.1 10*3/uL (ref 4.0–10.5)

## 2016-12-31 LAB — COMPREHENSIVE METABOLIC PANEL
ALK PHOS: 67 U/L (ref 38–126)
ALT: 34 U/L (ref 14–54)
ANION GAP: 11 (ref 5–15)
AST: 35 U/L (ref 15–41)
Albumin: 4 g/dL (ref 3.5–5.0)
BUN: 18 mg/dL (ref 6–20)
CALCIUM: 8.9 mg/dL (ref 8.9–10.3)
CHLORIDE: 105 mmol/L (ref 101–111)
CO2: 22 mmol/L (ref 22–32)
Creatinine, Ser: 1.05 mg/dL — ABNORMAL HIGH (ref 0.44–1.00)
GFR calc non Af Amer: 59 mL/min — ABNORMAL LOW (ref >60)
Glucose, Bld: 128 mg/dL — ABNORMAL HIGH (ref 65–99)
POTASSIUM: 3.5 mmol/L (ref 3.5–5.1)
SODIUM: 138 mmol/L (ref 135–145)
Total Bilirubin: 1.4 mg/dL — ABNORMAL HIGH (ref 0.3–1.2)
Total Protein: 8.5 g/dL — ABNORMAL HIGH (ref 6.5–8.1)

## 2016-12-31 LAB — URINALYSIS, ROUTINE W REFLEX MICROSCOPIC
BILIRUBIN URINE: NEGATIVE
Glucose, UA: NEGATIVE mg/dL
Ketones, ur: 20 mg/dL — AB
NITRITE: NEGATIVE
PROTEIN: NEGATIVE mg/dL
SPECIFIC GRAVITY, URINE: 1.036 — AB (ref 1.005–1.030)
pH: 6 (ref 5.0–8.0)

## 2016-12-31 LAB — I-STAT CG4 LACTIC ACID, ED: LACTIC ACID, VENOUS: 0.72 mmol/L (ref 0.5–1.9)

## 2016-12-31 SURGERY — CYSTOSCOPY, WITH RETROGRADE PYELOGRAM AND URETERAL STENT INSERTION
Anesthesia: General | Laterality: Left

## 2016-12-31 MED ORDER — LIDOCAINE 2% (20 MG/ML) 5 ML SYRINGE
INTRAMUSCULAR | Status: AC
  Filled 2016-12-31: qty 5

## 2016-12-31 MED ORDER — ONDANSETRON HCL 4 MG/2ML IJ SOLN
INTRAMUSCULAR | Status: AC
  Filled 2016-12-31: qty 2

## 2016-12-31 MED ORDER — ACETAMINOPHEN 500 MG PO TABS
1000.0000 mg | ORAL_TABLET | Freq: Three times a day (TID) | ORAL | Status: AC
  Administered 2016-12-31 – 2017-01-01 (×3): 1000 mg via ORAL
  Filled 2016-12-31 (×3): qty 2

## 2016-12-31 MED ORDER — DEXTROSE 5 % IV SOLN
1.0000 g | INTRAVENOUS | Status: DC
  Administered 2017-01-01: 1 g via INTRAVENOUS
  Filled 2016-12-31 (×2): qty 10

## 2016-12-31 MED ORDER — FENTANYL CITRATE (PF) 100 MCG/2ML IJ SOLN
INTRAMUSCULAR | Status: DC | PRN
  Administered 2016-12-31 (×2): 50 ug via INTRAVENOUS

## 2016-12-31 MED ORDER — ONDANSETRON HCL 4 MG/2ML IJ SOLN
4.0000 mg | Freq: Once | INTRAMUSCULAR | Status: AC
  Administered 2016-12-31: 4 mg via INTRAVENOUS
  Filled 2016-12-31: qty 2

## 2016-12-31 MED ORDER — FENTANYL CITRATE (PF) 100 MCG/2ML IJ SOLN: 25.0000 ug | INTRAMUSCULAR | Status: DC | PRN

## 2016-12-31 MED ORDER — SODIUM CHLORIDE 0.9 % IV SOLN
INTRAVENOUS | Status: DC
  Administered 2016-12-31: 21:00:00 via INTRAVENOUS

## 2016-12-31 MED ORDER — OXYCODONE HCL 5 MG PO TABS
5.0000 mg | ORAL_TABLET | ORAL | Status: DC | PRN
  Administered 2017-01-01: 5 mg via ORAL
  Filled 2016-12-31: qty 1

## 2016-12-31 MED ORDER — IOPAMIDOL (ISOVUE-300) INJECTION 61%
INTRAVENOUS | Status: AC
  Filled 2016-12-31: qty 100

## 2016-12-31 MED ORDER — PROPOFOL 10 MG/ML IV BOLUS
INTRAVENOUS | Status: AC
  Filled 2016-12-31: qty 20

## 2016-12-31 MED ORDER — IOHEXOL 300 MG/ML  SOLN
INTRAMUSCULAR | Status: DC | PRN
  Administered 2016-12-31: 10 mL via URETHRAL

## 2016-12-31 MED ORDER — IOPAMIDOL (ISOVUE-300) INJECTION 61%
100.0000 mL | Freq: Once | INTRAVENOUS | Status: AC | PRN
  Administered 2016-12-31: 100 mL via INTRAVENOUS

## 2016-12-31 MED ORDER — SENNOSIDES-DOCUSATE SODIUM 8.6-50 MG PO TABS
2.0000 | ORAL_TABLET | Freq: Every day | ORAL | Status: DC
Start: 2016-12-31 — End: 2017-01-02
  Administered 2016-12-31 – 2017-01-01 (×2): 2 via ORAL
  Filled 2016-12-31 (×2): qty 2

## 2016-12-31 MED ORDER — MIDAZOLAM HCL 5 MG/5ML IJ SOLN
INTRAMUSCULAR | Status: DC | PRN
  Administered 2016-12-31: 2 mg via INTRAVENOUS

## 2016-12-31 MED ORDER — DEXAMETHASONE SODIUM PHOSPHATE 10 MG/ML IJ SOLN
INTRAMUSCULAR | Status: DC | PRN
  Administered 2016-12-31: 10 mg via INTRAVENOUS

## 2016-12-31 MED ORDER — SODIUM CHLORIDE 0.9 % IR SOLN
Status: DC | PRN
  Administered 2016-12-31: 3000 mL via INTRAVESICAL

## 2016-12-31 MED ORDER — FENTANYL CITRATE (PF) 100 MCG/2ML IJ SOLN
INTRAMUSCULAR | Status: AC
  Filled 2016-12-31: qty 2

## 2016-12-31 MED ORDER — SODIUM CHLORIDE 0.9 % IV BOLUS (SEPSIS)
1000.0000 mL | Freq: Once | INTRAVENOUS | Status: AC
  Administered 2016-12-31: 1000 mL via INTRAVENOUS

## 2016-12-31 MED ORDER — LIDOCAINE 2% (20 MG/ML) 5 ML SYRINGE
INTRAMUSCULAR | Status: DC | PRN
  Administered 2016-12-31: 100 mg via INTRAVENOUS

## 2016-12-31 MED ORDER — DEXAMETHASONE SODIUM PHOSPHATE 10 MG/ML IJ SOLN
INTRAMUSCULAR | Status: AC
  Filled 2016-12-31: qty 1

## 2016-12-31 MED ORDER — SODIUM CHLORIDE 0.9 % IV SOLN
INTRAVENOUS | Status: DC
  Administered 2016-12-31: 12:00:00 via INTRAVENOUS

## 2016-12-31 MED ORDER — MIDAZOLAM HCL 2 MG/2ML IJ SOLN
INTRAMUSCULAR | Status: AC
  Filled 2016-12-31: qty 2

## 2016-12-31 MED ORDER — ONDANSETRON HCL 4 MG/2ML IJ SOLN
INTRAMUSCULAR | Status: DC | PRN
  Administered 2016-12-31: 4 mg via INTRAVENOUS

## 2016-12-31 MED ORDER — 0.9 % SODIUM CHLORIDE (POUR BTL) OPTIME
TOPICAL | Status: DC | PRN
  Administered 2016-12-31: 1000 mL

## 2016-12-31 MED ORDER — DEXTROSE 5 % IV SOLN
1.0000 g | Freq: Once | INTRAVENOUS | Status: AC
  Administered 2016-12-31: 1 g via INTRAVENOUS
  Filled 2016-12-31: qty 10

## 2016-12-31 MED ORDER — PROPOFOL 10 MG/ML IV BOLUS
INTRAVENOUS | Status: DC | PRN
  Administered 2016-12-31: 160 mg via INTRAVENOUS

## 2016-12-31 SURGICAL SUPPLY — 14 items
BAG URO CATCHER STRL LF (MISCELLANEOUS) ×2 IMPLANT
BASKET ZERO TIP NITINOL 2.4FR (BASKET) IMPLANT
BSKT STON RTRVL ZERO TP 2.4FR (BASKET)
CATH INTERMIT  6FR 70CM (CATHETERS) IMPLANT
CLOTH BEACON ORANGE TIMEOUT ST (SAFETY) ×2 IMPLANT
COVER SURGICAL LIGHT HANDLE (MISCELLANEOUS) ×2 IMPLANT
GLOVE BIOGEL M STRL SZ7.5 (GLOVE) ×2 IMPLANT
GOWN STRL REUS W/TWL LRG LVL3 (GOWN DISPOSABLE) ×4 IMPLANT
GUIDEWIRE ANG ZIPWIRE 038X150 (WIRE) IMPLANT
GUIDEWIRE STR DUAL SENSOR (WIRE) ×2 IMPLANT
MANIFOLD NEPTUNE II (INSTRUMENTS) ×2 IMPLANT
PACK CYSTO (CUSTOM PROCEDURE TRAY) ×2 IMPLANT
STENT URET 6FRX26 CONTOUR (STENTS) ×2 IMPLANT
TUBING CONNECTING 10 (TUBING) ×2 IMPLANT

## 2016-12-31 NOTE — Progress Notes (Signed)
Patient was very happy to receive her belongings

## 2016-12-31 NOTE — Brief Op Note (Signed)
12/31/2016  6:45 PM  PATIENT:  Lynn Estrada  56 y.o. female  PRE-OPERATIVE DIAGNOSIS:  LEFT URETERAL STONE  POST-OPERATIVE DIAGNOSIS:  LEFT URETERAL STONE  PROCEDURE:  Procedure(s): CYSTOSCOPY WITH RETROGRADE PYELOGRAM/ LEFT URETERAL STENT PLACEMENT (Left)  SURGEON:  Surgeon(s) and Role:    * Alexis Frock, MD - Primary  PHYSICIAN ASSISTANT:   ASSISTANTS: none   ANESTHESIA:   general  EBL:  No intake/output data recorded.  BLOOD ADMINISTERED:none  DRAINS: none   LOCAL MEDICATIONS USED:  NONE  SPECIMEN:  Source of Specimen:  left renal pelvis urine  DISPOSITION OF SPECIMEN:  microbiology  COUNTS:  YES  TOURNIQUET:  * No tourniquets in log *  DICTATION: .Other Dictation: Dictation Number [id number]  PLAN OF CARE: Admit to inpatient   PATIENT DISPOSITION:  PACU - hemodynamically stable.   Delay start of Pharmacological VTE agent (>24hrs) due to surgical blood loss or risk of bleeding: yes

## 2016-12-31 NOTE — ED Notes (Signed)
Pt made aware of need for urine specimen; pt states she is unable to urinate at this time. Intervention: IV fluid bolus, Plan: pt to inform staff as soon as she feels able to try to urinate.

## 2016-12-31 NOTE — Anesthesia Postprocedure Evaluation (Signed)
Anesthesia Post Note  Patient: Lynn Estrada  Procedure(s) Performed: Procedure(s) (LRB): CYSTOSCOPY WITH RETROGRADE PYELOGRAM/ LEFT URETERAL STENT PLACEMENT (Left)     Patient location during evaluation: PACU Level of consciousness: awake Pain management: pain level controlled Vital Signs Assessment: post-procedure vital signs reviewed and stable Respiratory status: spontaneous breathing Cardiovascular status: stable Anesthetic complications: no    Last Vitals:  Vitals:   12/31/16 1858 12/31/16 1900  BP: 113/79 112/72  Pulse: 96 98  Resp: (!) 22 (!) 24  Temp: 37.4 C     Last Pain:  Vitals:   12/31/16 1256  TempSrc: Oral  PainSc:                  [name]

## 2016-12-31 NOTE — Anesthesia Preprocedure Evaluation (Addendum)
Anesthesia Evaluation  Patient identified by MRN, date of birth, ID band Patient awake    Reviewed: Allergy & Precautions, NPO status , Patient's Chart, lab work & pertinent test results  Airway Mallampati: II  TM Distance: >3 FB     Dental   Pulmonary pneumonia, former smoker,    breath sounds clear to auscultation       Cardiovascular hypertension,  Rhythm:Regular Rate:Normal     Neuro/Psych    GI/Hepatic Neg liver ROS,   Endo/Other    Renal/GU Renal disease     Musculoskeletal   Abdominal   Peds  Hematology  (+) anemia ,   Anesthesia Other Findings   Reproductive/Obstetrics                             Anesthesia Physical Anesthesia Plan  ASA: III  Anesthesia Plan: General   Post-op Pain Management:    Induction: Intravenous  PONV Risk Score and Plan: 2 and Ondansetron, Midazolam, Propofol and Dexamethasone  Airway Management Planned: LMA  Additional Equipment:   Intra-op Plan:   Post-operative Plan: Extubation in OR  Informed Consent: I have reviewed the patients History and Physical, chart, labs and discussed the procedure including the risks, benefits and alternatives for the proposed anesthesia with the patient or authorized representative who has indicated his/her understanding and acceptance.   Dental advisory given  Plan Discussed with: CRNA, Anesthesiologist and Surgeon  Anesthesia Plan Comments:       Anesthesia Quick Evaluation

## 2016-12-31 NOTE — Progress Notes (Signed)
Patient stated that she had 3 or 4 bank cards $15 and a cellphone (Android Moto X) were the items placed into an envelope in ED.  Awaiting a return call from ED.  RN told patient that the ED had been notified.

## 2016-12-31 NOTE — ED Triage Notes (Signed)
patient c/o abd pain for couple days with nausea and dry heaves. Patient reports fever of 102 last night and took tylenol.  Patient having dry mouth.

## 2016-12-31 NOTE — Progress Notes (Signed)
Patient stated that she does not have debit and credit cards. RN called PACU.RN called the OR- the OR stated that the belongings were not with patient in the OR. RN called ED. On hold. On hold for over 30 mins

## 2016-12-31 NOTE — Transfer of Care (Signed)
Immediate Anesthesia Transfer of Care Note  Patient: Lynn Estrada  Procedure(s) Performed: Procedure(s): CYSTOSCOPY WITH RETROGRADE PYELOGRAM/ LEFT URETERAL STENT PLACEMENT (Left)  Patient Location: PACU  Anesthesia Type:General  Level of Consciousness:  sedated, patient cooperative and responds to stimulation  Airway & Oxygen Therapy:Patient Spontanous Breathing and Patient connected to face mask oxgen  Post-op Assessment:  Report given to PACU RN and Post -op Vital signs reviewed and stable  Post vital signs:  Reviewed and stable  Last Vitals:  Vitals:   12/31/16 1640 12/31/16 1700  BP:  (!) 130/100  Pulse:  99  Resp: 16 16  Temp:      Complications: No apparent anesthesia complications

## 2016-12-31 NOTE — ED Notes (Signed)
Patient transported to CT 

## 2016-12-31 NOTE — ED Notes (Signed)
Patient transported to X-ray 

## 2016-12-31 NOTE — ED Notes (Signed)
Pt transported to OR. Consent signed. OR aware of pt transport.

## 2016-12-31 NOTE — ED Provider Notes (Signed)
[facility] DEPT Provider Note   CSN: [id number] Arrival date & time: 12/31/16  1002     History   Chief Complaint Chief Complaint  Patient presents with  . Abdominal Pain  . Nausea    HPI Rhondalyn Clingan Morocho is a 56 y.o. female.  She presents for evaluation of nausea, abdominal pain, chills and fever.  She has had "dry heaves," but no emesis.  She denies diarrhea.  Last bowel movement was yesterday.  She feels that she has had sick contacts, at work.  She denies cough, chest pain, shortness of breath, paresthesia or focal weakness.  She feels generally weak, and has had trouble walking, because of decreased oral intake.  She has not had much to eat, since yesterday morning.  Currently, she is thirsty.  There are no other known modifying factors.  HPI  Past Medical History:  Diagnosis Date  . AKI (acute kidney injury) ([place]) 07/02/2016  . CAP (community acquired pneumonia) 07/06/2016  . Fibroid, uterine   . Hypertension   . Pyelonephritis 07/02/2016  . Severe sepsis ([place]) 07/02/2016    Patient Active Problem List   Diagnosis Date Noted  . Anemia 07/10/2016  . Essential hypertension 07/26/2015  . Eczema 07/26/2015  . BMI 28.0-28.9,adult 07/26/2015    Past Surgical History:  Procedure Laterality Date  . ABDOMINAL HYSTERECTOMY     uterine fibroids; ovaries remain  . BACK SURGERY  1996   lumbar  . CHOLECYSTECTOMY    . PARTIAL NEPHRECTOMY Right    due to large kidney stone    OB History    No data available       Home Medications    Prior to Admission medications   Medication Sig Start Date End Date Taking? Authorizing Provider  acetaminophen (TYLENOL) 500 MG tablet Take 1,000 mg by mouth every 6 (six) hours as needed for mild pain or moderate pain.   Yes [provider]  amLODipine (NORVASC) 10 MG tablet Take 10 mg by mouth daily. 11/12/16  Yes [provider]  aspirin EC 81 MG tablet Take 81 mg by mouth daily.   Yes [provider]    Multiple Vitamins-Minerals (MULTIVITAMIN ADULT PO) Take 1 tablet by mouth daily.   Yes [provider]  hydrochlorothiazide (HYDRODIURIL) 25 MG tablet Take 1 tablet (25 mg total) by mouth daily. Patient not taking: Reported on 12/31/2016 08/01/16   [name], PA-C  hydrOXYzine (ATARAX/VISTARIL) 50 MG tablet Take 1 tablet (50 mg total) by mouth at bedtime as needed. Patient not taking: Reported on 08/01/2016 07/26/16   Horald Pollen, MD  Omeprazole 20 MG TBEC Take 1 tablet (20 mg total) by mouth daily. 30-60 mins before breakfast Patient not taking: Reported on 08/03/2016 07/20/16   Levin Erp, PA  triamcinolone (KENALOG) 0.025 % ointment Apply 1 application topically 2 (two) times daily. Patient not taking: Reported on 12/31/2016 08/01/16   [name], PA-C    Family History Family History  Problem Relation Age of Onset  . Hypertension Sister   . Eczema Sister   . Lupus Sister   . Sarcoidosis Mother   . Prostate cancer Father   . Gout Maternal Grandmother   . Diabetes Maternal Grandmother        leg amputations  . Breast cancer Maternal Aunt   . Stomach cancer Neg Hx   . Colon cancer Neg Hx     Social History Social History  Substance Use Topics  . Smoking status: Former Smoker  Packs/day: 0.75    Years: 5.00    Types: Cigarettes  . Smokeless tobacco: Never Used     Comment: smoked back in her 30's  . Alcohol use 0.0 oz/week     Comment: social use; once a month     Allergies   Bactrim [sulfamethoxazole-trimethoprim]   Review of Systems Review of Systems  All other systems reviewed and are negative.    Physical Exam Updated Vital Signs BP 119/74   Pulse (!) 102   Temp 99.4 F (37.4 C) (Oral)   Resp 16   Ht 6\' 1"  (1.854 m)   Wt 100.2 kg (221 lb)   SpO2 100%   BMI 29.16 kg/m   Physical Exam  Constitutional: She is oriented to person, place, and time. She appears well-developed and well-nourished.  HENT:  Head:  Normocephalic and atraumatic.  Eyes: Conjunctivae and EOM are normal. Pupils are equal, round, and reactive to light.  Neck: Normal range of motion and phonation normal. Neck supple.  Cardiovascular: Normal rate and regular rhythm.   Pulmonary/Chest: Effort normal and breath sounds normal. She exhibits no tenderness.  Abdominal: Soft. She exhibits no distension. There is tenderness (Diffuse, mild). There is no guarding.  Decreased bowel sounds  Musculoskeletal: Normal range of motion.  Neurological: She is alert and oriented to person, place, and time. She exhibits normal muscle tone.  Skin: Skin is warm and dry.  Psychiatric: She has a normal mood and affect. Her behavior is normal. Judgment and thought content normal.  Nursing note and vitals reviewed.    ED Treatments / Results  Labs (all labs ordered are listed, but only abnormal results are displayed) Labs Reviewed  COMPREHENSIVE METABOLIC PANEL - Abnormal; Notable for the following:       Result Value   Glucose, Bld 128 (*)    Creatinine, Ser 1.05 (*)    Total Protein 8.5 (*)    Total Bilirubin 1.4 (*)    GFR calc non Af Amer 59 (*)    All other components within normal limits  CBC WITH DIFFERENTIAL/PLATELET - Abnormal; Notable for the following:    Hemoglobin 11.6 (*)    HCT 34.5 (*)    Neutro Abs 7.8 (*)    All other components within normal limits  URINALYSIS, ROUTINE W REFLEX MICROSCOPIC - Abnormal; Notable for the following:    APPearance HAZY (*)    Specific Gravity, Urine 1.036 (*)    Hgb urine dipstick MODERATE (*)    Ketones, ur 20 (*)    Leukocytes, UA LARGE (*)    Bacteria, UA MANY (*)    Squamous Epithelial / LPF 0-5 (*)    All other components within normal limits  URINE CULTURE  I-STAT CG4 LACTIC ACID, ED    EKG  EKG Interpretation None       Radiology Dg Chest 2 View  Result Date: 12/31/2016 CLINICAL DATA:  Fever with abdominal pain and dizziness EXAM: CHEST  2 VIEW COMPARISON:  07/05/2016  FINDINGS: Cardiomegaly, stable. Few linear opacities in the lingula, likely scarring based on 07/02/2016 study. Pulmonary edema or infiltrates have resolved since prior. No Kerley lines, effusion, or pneumothorax. Atherosclerotic calcification of the aortic arch. IMPRESSION: No visible pneumonia. Chronic cardiomegaly. Electronically Signed   By: Monte Fantasia M.D.   On: 12/31/2016 11:36   Ct Abdomen Pelvis W Contrast  Result Date: 12/31/2016 CLINICAL DATA:  Abdominal pain and nausea. EXAM: CT ABDOMEN AND PELVIS WITH CONTRAST TECHNIQUE: Multidetector CT imaging of the abdomen  and pelvis was performed using the standard protocol following bolus administration of intravenous contrast. CONTRAST:  131mL ISOVUE-300 IOPAMIDOL (ISOVUE-300) INJECTION 61% COMPARISON:  None. FINDINGS: Lower chest: No acute abnormality. Hepatobiliary: Ill-defined hypoattenuated mass is seen in the inferior aspect of the right lobe of the liver, measuring 2.7 x 2.1 cm. The gallbladder is normal. Pancreas: Unremarkable. No pancreatic ductal dilatation or surrounding inflammatory changes. Spleen: Normal in size without focal abnormality. Adrenals/Urinary Tract: Normal adrenal glands. Obstructing 14 mm proximal left ureteral calculus is located 4.3 cm inferior to the renal pelvis. Subsequent moderate hydroureter and hydronephrosis. Three nonobstructing renal calculi in the lower pole of the right kidney, the largest measuring 6 mm. Urinary bladder is normal. Stomach/Bowel: Stomach is within normal limits. Appendix appears normal. No evidence of bowel wall thickening, distention, or inflammatory changes. Vascular/Lymphatic: Aortic atherosclerosis. No enlarged abdominal or pelvic lymph nodes. Multiple shotty mesenteric and left retroperitoneal lymph nodes. Reproductive: Status post hysterectomy. No adnexal masses. Other: No abdominal wall hernia or abnormality. No abdominopelvic ascites. Musculoskeletal: No acute osseous findings. 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 IMPRESSION: Left obstructive uropathy caused by 14 mm proximal left ureteral calculus. Nonobstructive right nephrolithiasis. 2.7 cm hypoattenuated lesion within the inferior right lobe of the liver. Further evaluation of this indeterminate mass may be performed using liver protocol MRI, when clinically feasible. Aortic atherosclerosis. Electronically Signed   By: Fidela Salisbury M.D.   On: 12/31/2016 12:25    Procedures Procedures (including critical care time)  Medications Ordered in ED Medications  0.9 %  sodium chloride infusion ( Intravenous Stopped 12/31/16 1428)  iopamidol (ISOVUE-300) 61 % injection (not administered)  cefTRIAXone (ROCEPHIN) 1 g in dextrose 5 % 50 mL IVPB (1 g Intravenous New Bag/Given 12/31/16 1638)  sodium chloride 0.9 % bolus 1,000 mL (0 mLs Intravenous Stopped 12/31/16 1453)  ondansetron (ZOFRAN) injection 4 mg (4 mg Intravenous Given 12/31/16 1141)  iopamidol (ISOVUE-300) 61 % injection 100 mL (100 mLs Intravenous Contrast Given 12/31/16 1152)     Initial Impression / Assessment and Plan / ED Course  I have reviewed the triage vital signs and the nursing notes.  Pertinent labs & imaging results that were available during my care of the patient were reviewed by me and considered in my medical decision making (see chart for details).  Clinical Course as of Jan 01 1643  Mon Dec 31, 2016  1637 Creatinine: (!) 1.05 [EW]    Clinical Course User Index [EW] Daleen Bo, MD     Patient Vitals for the past 24 hrs:  BP Temp Temp src Pulse Resp SpO2 Height Weight  12/31/16 1640 - - - - 16 - - -  12/31/16 1615 - - - (!) 102 - 100 % - -  12/31/16 1600 119/74 - - - - - - -  12/31/16 1511 124/86 - - (!) 108 16 93 % - -  12/31/16 1406 - - - - 16 - - -  12/31/16 1405 - - - (!) 109 - 98 % - -  12/31/16 1300 108/68 - - (!) 103 - 93 % - -  12/31/16 1256 - 99.4 F (37.4 C) Oral - 16 - - -    12/31/16 1254 114/70 - - (!) 107 16 100 % - -  12/31/16 1249 - - - (!) 103 - 95 % - -  12/31/16 1245 - - - (!) 105 - 100 % - -  12/31/16 1244 - - - Marland Kitchen)  108 - 96 % - -  12/31/16 1022 - - - - - - 6\' 1"  (1.854 m) 100.2 kg (221 lb)  12/31/16 1014 110/67 (!) 100.8 F (38.2 C) Oral (!) 118 18 96 % - -   4:25 PM Reevaluation with update and discussion. After initial assessment and treatment, an updated evaluation reveals patient states that she is uncomfortable, and continues to complain of pain, now localizing in the epigastrium region.  She denies left flank pain.Daleen Bo L    16: 30-call requested from urology.  Dr. Tresa Moore, he will see the patient in the ED, and anticipates an operative intervention.  16: 40   Final Clinical Impressions(s) / ED Diagnoses   Final diagnoses:  Left ureteral stone  Hydronephrosis with urinary obstruction due to ureteral calculus  Urinary tract infection without hematuria, site unspecified  Liver mass   Urinary tract infection with obstructing left ureteral stone, very large.  Incidental liver mass, is present.  Patient may have an infected and obstructing kidney stone, therefore will require hospitalization for management and stabilization.  Nursing Notes Reviewed/ Care Coordinated Applicable Imaging Reviewed Interpretation of Laboratory Data incorporated into ED treatment   Plan: Admit   New Prescriptions New Prescriptions   No medications on file     Daleen Bo, MD 12/31/16 1644

## 2016-12-31 NOTE — H&P (Signed)
Lynn Estrada is an 55 y.o. female.    Chief Complaint: Left Ureteral Stone, Urosepsis  HPI:   1 - Left Ureteral Stone - 25m left proximal ureteral stone + 143mlower pole with hydro + stranding by ER Ct on eval abdonabdominal pain and fevers. No Rt sided stones. Stones 600HU. Cr 1.05.  2 -  Urosepsis - fevers to 102, tachycardia, bacteruria c/w sepsis fromurinary source. UCX 7/2 pending and placed on empiric rocephin.  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  PMH sig for Rt hemi nephrectomy, hernis surgery, back surgery (no deficits). No CV disease / blood thinners.  Today "[name] seen as urgent admission for above. [place] meal > 8 hours ago.   Past Medical History:  Diagnosis Date  . [place] kidney [place]) ([facility]  . CAP (community acquired pneumonia) 07/06/2016  . Fibroid, uterine   . Hypertension   . Pyelonephritis 07/02/2016  . Severe [place]    Past Surgical History:  Procedure Laterality Date  . ABDOMINAL HYSTERECTOMY     uterine fibroids; ovaries remain  . BACK SURGERY  1996   lumbar  . CHOLECYSTECTOMY    . PARTIAL NEPHRECTOMY Right    due to large kidney stone    Family History  Problem Relation Age of Onset  . Hypertension Sister   . Eczema Sister   . Lupus Sister   . Sarcoidosis Mother   . Prostate cancer Father   . Gout Maternal Grandmother   . Diabetes Maternal Grandmother        leg amputations  . Breast cancer Maternal Aunt   . Stomach cancer Neg Hx   . Colon cancer Neg Hx    Social History:  reports that she has quit smoking. Her smoking use included Cigarettes. She has a 3.75 pack-year smoking history. She has never used smokeless tobacco. She reports that she drinks alcohol. She reports that she does not use drugs.  Allergies:  Allergies  Allergen Reactions  . Bactrim [Sulfamethoxazole-Trimethoprim]     rash     (Not in a hospital  admission)  Results for orders placed or performed during the hospital encounter of 12/31/16 (from the past 48 hour(s))  Comprehensive metabolic panel     Status: Abnormal   Collection Time: 12/31/16 10:47 AM  Result Value Ref Range   Sodium 138 135 - 145 mmol/L   Potassium 3.5 3.5 - 5.1 mmol/L   Chloride 105 101 - 111 mmol/L   CO2 22 22 - 32 mmol/L   Glucose, Bld 128 (H) 65 - 99 mg/dL   BUN 18 6 - 20 mg/dL   Creatinine, Ser 1.05 (H) 0.44 - 1.00 mg/dL   Calcium 8.9 8.9 - 10.3 mg/dL   Total Protein 8.5 (H) 6.5 - 8.1 g/dL   Albumin 4.0 3.5 - 5.0 g/dL   AST 35 15 - 41 U/L   ALT 34 14 - 54 U/L   Alkaline Phosphatase 67 38 - 126 U/L   Total Bilirubin 1.4 (H) 0.3 - 1.2 mg/dL   GFR calc non Af Amer 59 (L) >60 mL/min   GFR calc Af Amer >60 >60 mL/min    Comment: (NOTE) The eGFR has been calculated using the CKD EPI equation. This calculation has not been validated in all clinical situations. eGFR's persistently <60 mL/min signify possible Chronic Kidney Disease.    Anion gap 11 5 - 15  CBC  with Differential     Status: Abnormal   Collection Time: 12/31/16 10:47 AM  Result Value Ref Range   WBC 9.1 4.0 - 10.5 K/uL   RBC 4.16 3.87 - 5.11 MIL/uL   Hemoglobin 11.6 (L) 12.0 - 15.0 g/dL   HCT 34.5 (L) 36.0 - 46.0 %   MCV 82.9 78.0 - 100.0 fL   MCH 27.9 26.0 - 34.0 pg   MCHC 33.6 30.0 - 36.0 g/dL   RDW 14.3 11.5 - 15.5 %   Platelets 268 150 - 400 K/uL   Neutrophils Relative % 86 %   Neutro Abs 7.8 (H) 1.7 - 7.7 K/uL   Lymphocytes Relative 9 %   Lymphs Abs 0.9 0.7 - 4.0 K/uL   Monocytes Relative 5 %   Monocytes Absolute 0.4 0.1 - 1.0 K/uL   Eosinophils Relative 0 %   Eosinophils Absolute 0.0 0.0 - 0.7 K/uL   Basophils Relative 0 %   Basophils Absolute 0.0 0.0 - 0.1 K/uL  I-Stat CG4 Lactic Acid, ED     Status: None   Collection Time: 12/31/16 10:59 AM  Result Value Ref Range   Lactic Acid, Venous 0.72 0.5 - 1.9 mmol/L  Urinalysis, Routine w reflex microscopic     Status:  Abnormal   Collection Time: 12/31/16  2:01 PM  Result Value Ref Range   Color, Urine YELLOW YELLOW   APPearance HAZY (A) CLEAR   Specific Gravity, Urine 1.036 (H) 1.005 - 1.030   pH 6.0 5.0 - 8.0   Glucose, UA NEGATIVE NEGATIVE mg/dL   Hgb urine dipstick MODERATE (A) NEGATIVE   Bilirubin Urine NEGATIVE NEGATIVE   Ketones, ur 20 (A) NEGATIVE mg/dL   Protein, ur NEGATIVE NEGATIVE mg/dL   Nitrite NEGATIVE NEGATIVE   Leukocytes, UA LARGE (A) NEGATIVE   RBC / HPF 6-30 0 - 5 RBC/hpf   WBC, UA TOO NUMEROUS TO COUNT 0 - 5 WBC/hpf   Bacteria, UA MANY (A) NONE SEEN   Squamous Epithelial / LPF 0-5 (A) NONE SEEN   Dg Chest 2 View  Result Date: 12/31/2016 CLINICAL DATA:  Fever with abdominal pain and dizziness EXAM: CHEST  2 VIEW COMPARISON:  07/05/2016 FINDINGS: Cardiomegaly, stable. Few linear opacities in the lingula, likely scarring based on 07/02/2016 study. Pulmonary edema or infiltrates have resolved since prior. No Kerley lines, effusion, or pneumothorax. Atherosclerotic calcification of the aortic arch. IMPRESSION: No visible pneumonia. Chronic cardiomegaly. Electronically Signed   By: Monte Fantasia M.D.   On: 12/31/2016 11:36   Ct Abdomen Pelvis W Contrast  Result Date: 12/31/2016 CLINICAL DATA:  Abdominal pain and nausea. EXAM: CT ABDOMEN AND PELVIS WITH CONTRAST TECHNIQUE: Multidetector CT imaging of the abdomen and pelvis was performed using the standard protocol following bolus administration of intravenous contrast. CONTRAST:  161m ISOVUE-300 IOPAMIDOL (ISOVUE-300) INJECTION 61% COMPARISON:  None. FINDINGS: Lower chest: No acute abnormality. Hepatobiliary: Ill-defined hypoattenuated mass is seen in the inferior aspect of the right lobe of the liver, measuring 2.7 x 2.1 cm. The gallbladder is normal. Pancreas: Unremarkable. No pancreatic ductal dilatation or surrounding inflammatory changes. Spleen: Normal in size without focal abnormality. Adrenals/Urinary Tract: Normal adrenal glands.  Obstructing 14 mm proximal left ureteral calculus is located 4.3 cm inferior to the renal pelvis. Subsequent moderate hydroureter and hydronephrosis. Three nonobstructing renal calculi in the lower pole of the right kidney, the largest measuring 6 mm. Urinary bladder is normal. Stomach/Bowel: Stomach is within normal limits. Appendix appears normal. No evidence of bowel wall thickening, distention,  or inflammatory changes. Vascular/Lymphatic: Aortic atherosclerosis. No enlarged abdominal or pelvic lymph nodes. Multiple shotty mesenteric and left retroperitoneal lymph nodes. Reproductive: Status post hysterectomy. No adnexal masses. Other: No abdominal wall hernia or abnormality. No abdominopelvic ascites. Musculoskeletal: No acute osseous findings. 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 IMPRESSION: Left obstructive uropathy caused by 14 mm proximal left ureteral calculus. Nonobstructive right nephrolithiasis. 2.7 cm hypoattenuated lesion within the inferior right lobe of the liver. Further evaluation of this indeterminate mass may be performed using liver protocol MRI, when clinically feasible. Aortic atherosclerosis. Electronically Signed   By: Fidela Salisbury M.D.   On: 12/31/2016 12:25    Review of Systems  Constitutional: Positive for chills, fever and malaise/fatigue.  HENT: Negative.   Eyes: Negative.   Respiratory: Negative.   Cardiovascular: Negative.   Gastrointestinal: Positive for nausea.  Genitourinary: Positive for flank pain.  Skin: Negative.   Neurological: Negative.   Endo/Heme/Allergies: Negative.   Psychiatric/Behavioral: Negative.     Blood pressure 119/74, pulse (!) 102, temperature 99.4 F (37.4 C), temperature source Oral, resp. rate 16, height _0  (1.854 m), weight 100.2 kg (221 lb), SpO2 100 %. Physical Exam  Constitutional: She is oriented to person, place, and time. She appears well-developed.  HENT:   Head: Normocephalic.  Eyes: Pupils are equal, round, and reactive to light.  Neck: Normal range of motion.  Cardiovascular:  Regular tachycardia by bedside monitor  Respiratory: Effort normal.  GI: Soft.  Moderate truncal obesity. Scatterd scars w/o current hernias.   Genitourinary:  Genitourinary Comments: Mild left CVAT at present.   Musculoskeletal: Normal range of motion.  Neurological: She is alert and oriented to person, place, and time.  Skin: Skin is warm.  Psychiatric: She has a normal mood and affect.     Assessment/Plan  1 - Left Ureteral Stone -  rec urgent left renal decompression. Discussed stent v. neph tube and pt desires attempt at stent then staged ureteroscopy in elective setting. I agree. Risks, benefits, alternatives, expected peri-op course with goal of fever free x 24 hours before DC discussed.   2 -  Urosepsis - renal decompression, rocephin pending further CX data.   3- Rt Duplex Kidney  - no further intervention required.    Alexis Frock, MD 12/31/2016, 5:08 PM

## 2016-12-31 NOTE — Progress Notes (Signed)
RN called the ED to see if there had been any follow-up on the patient.

## 2016-12-31 NOTE — Anesthesia Procedure Notes (Signed)
Procedure Name: LMA Insertion Date/Time: 12/31/2016 6:33 PM Performed by: Noralyn Pick D Pre-anesthesia Checklist: Patient identified, Emergency Drugs available, Suction available and Patient being monitored Patient Re-evaluated:Patient Re-evaluated prior to inductionOxygen Delivery Method: Circle system utilized Preoxygenation: Pre-oxygenation with 100% oxygen Intubation Type: IV induction Ventilation: Mask ventilation without difficulty LMA: LMA inserted LMA Size: 4.0 Tube type: Oral Number of attempts: 1 Placement Confirmation: positive ETCO2 and breath sounds checked- equal and bilateral Tube secured with: Tape Dental Injury: Teeth and Oropharynx as per pre-operative assessment

## 2016-12-31 NOTE — Op Note (Signed)
Lynn Estrada, Lynn Estrada NO.:  [id number]  MEDICAL RECORD NO.:  [id number]  LOCATION:  [facility]25                         FACILITY:  [facility]  PHYSICIAN:  Alexis Frock, MD     DATE OF BIRTH:  [birth year]  DATE OF PROCEDURE: 12/31/2016                               OPERATIVE REPORT   DIAGNOSES:  Left proximal ureteral stone and urosepsis.  PROCEDURES: 1. Cystoscopy with left retrograde pyelogram and interpretation. 2. Left ureteral stent placement, 6 x 26 Contour, no tether.  ESTIMATED BLOOD LOSS:  Nil.  COMPLICATION:  None.  SPECIMEN:  Left renal pelvis urine for Gram stain and culture.  FINDINGS: 1. Hydronephrosis without ureteronephrosis to the level of large     filling defect in proximal ureter consistent with known stone. 2. Successful placement of left ureteral stent, proximal in midpole,     distal in the urinary bladder.  INDICATION:  Lynn Estrada is a 56 year old lady with a history of prior nephrolithiasis on workup of colicky flank pain and severe malaise to have a left proximal ureteral stone, fevers, tachycardia, bacteria worrisome for impending obstructing urosepsis and felt that urgent renal decompression was warranted.  Options were discussed including stenting versus nephrostomy tube, and she wished to proceed with attempted stent. Informed consent was obtained and placed in the medical record.  PROCEDURE IN DETAIL:  The patient being, Lynn Estrada, was verified. Procedure being left ureteral stent placement was confirmed.  Procedure was carried out.  Time-out was performed.  Intravenous antibiotics were administered.  General LMA anesthesia was introduced.  The patient was placed into a low lithotomy position and sterile field was created by prepping and draping the patient's vagina, introitus and proximal thighs using iodine.  Next, cystourethroscopy was performed using a rigid cystoscope with offset lens.  Inspection of the urinary bladder  revealed no diverticula, calcifications, papillary lesions.  The left ureteral orifice was cannulated with a 6-French end-hole catheter and left retrograde pyelogram was obtained.  Left retrograde pyelogram demonstrated a single left ureter with single- system left kidney.  There was large filling defect in the proximal ureter consistent with known stone.  There was significant hydronephrosis.  Above this, a 0.038 Sensor wire was advanced to the level of the upper pole, and a 6-French feeding tube advanced above this level.  A hydronephrotic drip specimen was obtained that was mildly purulent, set aside, labeled as left renal pelvis urine for Gram stain and culture.  The open-ended catheter was then exchanged over the Sensor wire for a new 6 x 26 Contour-type stent using cystoscopic and fluoroscopic guidance.  Good proximal and distal deployment were noted. Efflux of urine was seen around into the distal end of the stent. Bladder was emptied per cystoscope, procedure was then terminated.  The patient tolerated the procedure well.  There were no immediate periprocedural complications.  The patient was taken to the postanesthesia care unit in stable condition with plan for hospital admission with goals of defervescence and preliminary cultures before discharge.          ______________________________ Alexis Frock, MD     TM/MEDQ  D:  12/31/2016  T:  12/31/2016  Job:  [id number]

## 2016-12-31 NOTE — Progress Notes (Signed)
RN called security.  Then RN from ED called and stated the key had been found(for the lock box)

## 2017-01-01 ENCOUNTER — Encounter (HOSPITAL_COMMUNITY): Payer: Self-pay | Admitting: Urology

## 2017-01-01 NOTE — Progress Notes (Signed)
1 Day Post-Op   Subjective/Chief Complaint:  1 - Left Ureteral Stone - s/p cysto left JJ stent placement 7/2 for 7mm left proximal ureteral stone + 11mm lower pole with hydro + stranding by ER Ct on eval abdonabdominal pain and fevers. No Rt sided stones. Stones 600HU. Cr 1.05.  2 -  Urosepsis - fevers to 102, tachycardia, bacteruria c/w sepsis fromurinary source. UCX 7/2 pending and placed on empiric rocephin.  Today "[place]" is seen feeling improved. Tachycardia nad fever curve improving. Less malaise.    Objective: Vital signs in last 24 hours: Temp:  [97.5 F (36.4 C)-100.8 F (38.2 C)] 97.5 F (36.4 C) (07/03 0426) Pulse Rate:  [76-118] 76 (07/03 0426) Resp:  [16-24] 18 (07/03 0426) BP: (107-130)/(60-100) 109/76 (07/03 0426) SpO2:  [92 %-100 %] 98 % (07/03 0426) Weight:  [97.4 kg (214 lb 11.7 oz)-100.2 kg (221 lb)] 97.4 kg (214 lb 11.7 oz) (07/02 2110)    Intake/Output from previous day: 07/02 0701 - 07/03 0700 In: 1623.8 [P.O.:480; I.V.:1143.8] Out: 1000 [Urine:1000] Intake/Output this shift: No intake/output data recorded.  General appearance: alert, cooperative and less malaise Eyes: negative Nose: Nares normal. Septum midline. Mucosa normal. No drainage or sinus tenderness. Throat: lips, mucosa, and tongue normal; teeth and gums normal Neck: supple, symmetrical, trachea midline Back: symmetric, no curvature. ROM normal. No CVA tenderness. Resp: non-labored on room air.  Cardio: improved tachycardia Extremities: edema , Pulses: 2+ and symmetric Skin: Skin color, texture, turgor normal. No rashes or lesions Lymph nodes: Cervical, supraclavicular, and axillary nodes normal. Neurologic: Grossly normal  Lab Results:   Recent Labs  12/31/16 1047  WBC 9.1  HGB 11.6*  HCT 34.5*  PLT 268   BMET  Recent Labs  12/31/16 1047  NA 138  K 3.5  CL 105  CO2 22  GLUCOSE 128*  BUN 18  CREATININE 1.05*  CALCIUM 8.9   PT/INR No results for input(s):  LABPROT, INR in the last 72 hours. ABG No results for input(s): PHART, HCO3 in the last 72 hours.  Invalid input(s): PCO2, PO2  Studies/Results: Dg Chest 2 View  Result Date: 12/31/2016 CLINICAL DATA:  Fever with abdominal pain and dizziness EXAM: CHEST  2 VIEW COMPARISON:  07/05/2016 FINDINGS: Cardiomegaly, stable. Few linear opacities in the lingula, likely scarring based on 07/02/2016 study. Pulmonary edema or infiltrates have resolved since prior. No Kerley lines, effusion, or pneumothorax. Atherosclerotic calcification of the aortic arch. IMPRESSION: No visible pneumonia. Chronic cardiomegaly. Electronically Signed   By: Monte Fantasia M.D.   On: 12/31/2016 11:36   Dg Abd 1 View  Result Date: 12/31/2016 CLINICAL DATA:  56 year old female with ureteral stone. Subsequent encounter. EXAM: ABDOMEN - 1 VIEW COMPARISON:  12/31/2016 CT. FINDINGS: 6 images from left ureteral stent placement submitted for review after procedure. Initially filling of portion of the ureter. Filling defect proximal left ureter corresponding to obstructing stone seen on recent CT. Left hydronephrosis. Filling defect inferior left calyx of indeterminate etiology. Double-J ureteral stent placed. IMPRESSION: Filling defect proximal left ureter corresponding to obstructing stone seen on recent CT. Left hydronephrosis. Filling defect inferior left calyx of indeterminate etiology. Double-J ureteral stent placed. Electronically Signed   By: Genia Del M.D.   On: 12/31/2016 19:07   Ct Abdomen Pelvis W Contrast  Result Date: 12/31/2016 CLINICAL DATA:  Abdominal pain and nausea. EXAM: CT ABDOMEN AND PELVIS WITH CONTRAST TECHNIQUE: Multidetector CT imaging of the abdomen and pelvis was performed using the standard protocol following bolus administration of  intravenous contrast. CONTRAST:  115mL ISOVUE-300 IOPAMIDOL (ISOVUE-300) INJECTION 61% COMPARISON:  None. FINDINGS: Lower chest: No acute abnormality. Hepatobiliary: Ill-defined  hypoattenuated mass is seen in the inferior aspect of the right lobe of the liver, measuring 2.7 x 2.1 cm. The gallbladder is normal. Pancreas: Unremarkable. No pancreatic ductal dilatation or surrounding inflammatory changes. Spleen: Normal in size without focal abnormality. Adrenals/Urinary Tract: Normal adrenal glands. Obstructing 14 mm proximal left ureteral calculus is located 4.3 cm inferior to the renal pelvis. Subsequent moderate hydroureter and hydronephrosis. Three nonobstructing renal calculi in the lower pole of the right kidney, the largest measuring 6 mm. Urinary bladder is normal. Stomach/Bowel: Stomach is within normal limits. Appendix appears normal. No evidence of bowel wall thickening, distention, or inflammatory changes. Vascular/Lymphatic: Aortic atherosclerosis. No enlarged abdominal or pelvic lymph nodes. Multiple shotty mesenteric and left retroperitoneal lymph nodes. Reproductive: Status post hysterectomy. No adnexal masses. Other: No abdominal wall hernia or abnormality. No abdominopelvic ascites. Musculoskeletal: No acute osseous findings. 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 IMPRESSION: Left obstructive uropathy caused by 14 mm proximal left ureteral calculus. Nonobstructive right nephrolithiasis. 2.7 cm hypoattenuated lesion within the inferior right lobe of the liver. Further evaluation of this indeterminate mass may be performed using liver protocol MRI, when clinically feasible. Aortic atherosclerosis. Electronically Signed   By: Fidela Salisbury M.D.   On: 12/31/2016 12:25   Dg C-arm 1-60 Min-no Report  Result Date: 12/31/2016 Fluoroscopy was utilized by the requesting physician.  No radiographic interpretation.    Anti-infectives: Anti-infectives    Start     Dose/Rate Route Frequency Ordered Stop   01/01/17 1600  cefTRIAXone (ROCEPHIN) 1 g in dextrose 5 % 50 mL IVPB     1 g 100 mL/hr over 30 Minutes  Intravenous Every 24 hours 12/31/16 1958     12/31/16 1630  cefTRIAXone (ROCEPHIN) 1 g in dextrose 5 % 50 mL IVPB     1 g 100 mL/hr over 30 Minutes Intravenous  Once 12/31/16 1624 12/31/16 1708      Assessment/Plan:  1 - Left Ureteral Stone - now s/p stenting. Will need staged ureteroscopy in elective setting for goal of left side stone free. NO further stone intervention this admission.   2 -  Urosepsis - improving clinically. Continue empiric rocephin pending further CS data.  Remain in house untile fever free x 24 hours and at least prelim CX data available.    [facility], Arelia Volpe 01/01/2017

## 2017-01-02 LAB — URINE CULTURE: Culture: >=100000 — AB

## 2017-01-02 LAB — GRAM STAIN

## 2017-01-02 MED ORDER — CIPROFLOXACIN HCL 500 MG PO TABS: 500.0000 mg | ORAL_TABLET | Freq: Two times a day (BID) | ORAL | 0 refills | Status: AC

## 2017-01-02 MED ORDER — OXYBUTYNIN CHLORIDE 5 MG PO TABS: 5.0000 mg | ORAL_TABLET | Freq: Three times a day (TID) | ORAL | 0 refills | Status: DC | PRN

## 2017-01-02 MED ORDER — OXYCODONE HCL 5 MG PO TABS: 5.0000 mg | ORAL_TABLET | ORAL | 0 refills | Status: DC | PRN

## 2017-01-02 MED ORDER — SENNOSIDES-DOCUSATE SODIUM 8.6-50 MG PO TABS: 2.0000 | ORAL_TABLET | Freq: Every day | ORAL | 0 refills | Status: DC

## 2017-01-02 NOTE — Discharge Summary (Signed)
Date of admission: 12/31/2016  Date of discharge: 01/02/2017  Admission diagnosis: Left ureteral stone, sepsis  Discharge diagnosis: Same  Secondary diagnoses:  Patient Active Problem List   Diagnosis Date Noted  . Ureteral stone with hydronephrosis 12/31/2016  . Anemia 07/10/2016  . Essential hypertension 07/26/2015  . Eczema 07/26/2015  . BMI 28.0-28.9,adult 07/26/2015    History and Physical: For full details, please see admission history and physical. Briefly, Lynn Estrada is a 56 y.o. year old patient with left ureteral stone admitted for sepsis after placement of left ureteral stent.   Hospital Course: Patient tolerated the procedure well.  She was then transferred to the floor after an uneventful PACU stay.  Her hospital course was uncomplicated.  On POD#2 she had met discharge criteria: was eating a regular diet, was up and ambulating independently,  pain was well controlled, was voiding without a catheter, and was ready to for discharge. Culture grew Klebsiella sensitive to cipro.   PE: NAD Soft NT ND No foley MAE  Laboratory values:   Recent Labs  12/31/16 1047  WBC 9.1  HGB 11.6*  HCT 34.5*    Recent Labs  12/31/16 1047  NA 138  K 3.5  CL 105  CO2 22  GLUCOSE 128*  BUN 18  CREATININE 1.05*  CALCIUM 8.9   No results for input(s): LABPT, INR in the last 72 hours. No results for input(s): LABURIN in the last 72 hours. Results for orders placed or performed during the hospital encounter of 12/31/16  Urine culture     Status: Abnormal   Collection Time: 12/31/16  4:34 PM  Result Value Ref Range Status   Specimen Description URINE, CLEAN CATCH  Final   Special Requests NONE  Final   Culture >=100,000 COLONIES/mL KLEBSIELLA PNEUMONIAE (A)  Final   Report Status 01/02/2017 FINAL  Final   Organism ID, Bacteria KLEBSIELLA PNEUMONIAE (A)  Final      Susceptibility   Klebsiella pneumoniae - MIC*    AMPICILLIN >=32 RESISTANT Resistant     CEFAZOLIN <=4  SENSITIVE Sensitive     CEFTRIAXONE <=1 SENSITIVE Sensitive     CIPROFLOXACIN <=0.25 SENSITIVE Sensitive     GENTAMICIN <=1 SENSITIVE Sensitive     IMIPENEM <=0.25 SENSITIVE Sensitive     NITROFURANTOIN 128 RESISTANT Resistant     TRIMETH/SULFA <=20 SENSITIVE Sensitive     AMPICILLIN/SULBACTAM 8 SENSITIVE Sensitive     PIP/TAZO <=4 SENSITIVE Sensitive     Extended ESBL NEGATIVE Sensitive     * >=100,000 COLONIES/mL KLEBSIELLA PNEUMONIAE  Urine Culture     Status: Abnormal (Preliminary result)   Collection Time: 12/31/16  6:37 PM  Result Value Ref Range Status   Specimen Description CYSTOSCOPY LEFT RENAL  Final   Special Requests NONE  Final   Culture (A)  Final    20,000 COLONIES/mL KLEBSIELLA PNEUMONIAE SUSCEPTIBILITIES TO FOLLOW Performed at [facility], [address], [place], [place] [address]    Report Status PENDING  Incomplete  Gram stain     Status: None (Preliminary result)   Collection Time: 12/31/16  6:37 PM  Result Value Ref Range Status   Specimen Description CYSTOSCOPY LEFT RENAL  Final   Special Requests NONE  Final   Gram Stain   Final    CYTOSPIN SMEAR WBC PRESENT, PREDOMINANTLY PMN GRAM VARIABLE ROD Performed at [facility], [place] [address] [place], [place] [address]    Report Status PENDING  Incomplete    Disposition:  Home  Discharge instruction: The patient was instructed to be ambulatory but told to refrain from heavy lifting, strenuous activity, or driving.   Discharge medications: Allergies as of 01/02/2017      Reactions   Bactrim [sulfamethoxazole-trimethoprim]    rash      Medication List    TAKE these medications   acetaminophen 500 MG tablet Commonly known as:  TYLENOL Take 1,000 mg by mouth every 6 (six) hours as needed for mild pain or moderate pain.   amLODipine 10 MG tablet Commonly known as:  NORVASC Take 10 mg by mouth daily.   aspirin EC 81 MG tablet Take 81 mg by mouth daily.   ciprofloxacin 500 MG  tablet Commonly known as:  CIPRO Take 1 tablet (500 mg total) by mouth 2 (two) times daily.   hydrochlorothiazide 25 MG tablet Commonly known as:  HYDRODIURIL Take 1 tablet (25 mg total) by mouth daily.   hydrOXYzine 50 MG tablet Commonly known as:  ATARAX/VISTARIL Take 1 tablet (50 mg total) by mouth at bedtime as needed.   MULTIVITAMIN ADULT PO Take 1 tablet by mouth daily.   Omeprazole 20 MG Tbec Take 1 tablet (20 mg total) by mouth daily. 30-60 mins before breakfast   oxybutynin 5 MG tablet Commonly known as:  DITROPAN Take 1 tablet (5 mg total) by mouth every 8 (eight) hours as needed for bladder spasms.   oxyCODONE 5 MG immediate release tablet Commonly known as:  Oxy IR/ROXICODONE Take 1 tablet (5 mg total) by mouth every 4 (four) hours as needed for moderate pain.   senna-docusate 8.6-50 MG tablet Commonly known as:  Senokot-S Take 2 tablets by mouth at bedtime.   triamcinolone 0.025 % ointment Commonly known as:  KENALOG Apply 1 application topically 2 (two) times daily.       Followup:  Follow-up Information    Alexis Frock, MD Follow up.   Specialty:  Urology Why:  Office will call.  Contact information: [place] [address] [phone]

## 2017-01-03 LAB — URINE CULTURE: Culture: 20000 — AB

## 2017-01-10 ENCOUNTER — Other Ambulatory Visit: Payer: Self-pay | Admitting: Urology

## 2017-01-10 ENCOUNTER — Encounter (HOSPITAL_BASED_OUTPATIENT_CLINIC_OR_DEPARTMENT_OTHER): Payer: Self-pay | Admitting: *Deleted

## 2017-01-14 ENCOUNTER — Encounter (HOSPITAL_BASED_OUTPATIENT_CLINIC_OR_DEPARTMENT_OTHER): Payer: Self-pay | Admitting: *Deleted

## 2017-01-14 NOTE — Progress Notes (Signed)
NPO AFTER MN W/ EXCEPTION CLEAR LIQUIDS UNTIL 0730 (NO CREAM /MILK PRODUCTS).  ARRIVE AT 1215.  NEEDS EKG.  CURRENT LAB RESULTS IN CHART AND EPIC.  WILL TAKE PRILOSEC AND NORVASC AM DOS W/ SIPS OF WATER IF NEEDED TAKE OXYCODONE/ OXYBUTYNIN.

## 2017-01-17 ENCOUNTER — Encounter (HOSPITAL_BASED_OUTPATIENT_CLINIC_OR_DEPARTMENT_OTHER): Payer: Self-pay | Admitting: *Deleted

## 2017-01-17 NOTE — Progress Notes (Signed)
To [facility] at 0700 02/01/2017  for second stage procedure. Npo after Mn.Ekg should be in epic from visit 01/18/2017- Istat 8 on arrival.Will take norvasc,prilosec in am.

## 2017-01-18 ENCOUNTER — Encounter (HOSPITAL_BASED_OUTPATIENT_CLINIC_OR_DEPARTMENT_OTHER)
Admission: RE | Disposition: A | Discharge status: Home / Self Care | Payer: Self-pay | Source: Ambulatory Visit | Attending: Urology

## 2017-01-18 ENCOUNTER — Encounter (HOSPITAL_BASED_OUTPATIENT_CLINIC_OR_DEPARTMENT_OTHER): Payer: Self-pay | Admitting: *Deleted

## 2017-01-18 ENCOUNTER — Ambulatory Visit (HOSPITAL_BASED_OUTPATIENT_CLINIC_OR_DEPARTMENT_OTHER): Payer: BLUE CROSS/BLUE SHIELD | Admitting: Anesthesiology

## 2017-01-18 ENCOUNTER — Other Ambulatory Visit: Payer: Self-pay

## 2017-01-18 ENCOUNTER — Ambulatory Visit (HOSPITAL_BASED_OUTPATIENT_CLINIC_OR_DEPARTMENT_OTHER)
Admission: RE | Admit: 2017-01-18 | Discharge: 2017-01-18 | Disposition: A | Discharge status: Home / Self Care | Payer: BLUE CROSS/BLUE SHIELD | Source: Ambulatory Visit | Attending: Urology | Admitting: Urology

## 2017-01-18 DIAGNOSIS — I10 Essential (primary) hypertension: Secondary | ICD-10-CM | POA: Diagnosis not present

## 2017-01-18 DIAGNOSIS — D649 Anemia, unspecified: Secondary | ICD-10-CM | POA: Diagnosis not present

## 2017-01-18 DIAGNOSIS — Z87891 Personal history of nicotine dependence: Secondary | ICD-10-CM | POA: Insufficient documentation

## 2017-01-18 DIAGNOSIS — Z905 Acquired absence of kidney: Secondary | ICD-10-CM | POA: Insufficient documentation

## 2017-01-18 DIAGNOSIS — N132 Hydronephrosis with renal and ureteral calculous obstruction: Secondary | ICD-10-CM | POA: Diagnosis not present

## 2017-01-18 DIAGNOSIS — N202 Calculus of kidney with calculus of ureter: Secondary | ICD-10-CM | POA: Insufficient documentation

## 2017-01-18 DIAGNOSIS — Z882 Allergy status to sulfonamides status: Secondary | ICD-10-CM | POA: Insufficient documentation

## 2017-01-18 DIAGNOSIS — L309 Dermatitis, unspecified: Secondary | ICD-10-CM | POA: Diagnosis not present

## 2017-01-18 DIAGNOSIS — K219 Gastro-esophageal reflux disease without esophagitis: Secondary | ICD-10-CM | POA: Insufficient documentation

## 2017-01-18 HISTORY — DX: Personal history of other complications of pregnancy, childbirth and the puerperium: Z87.59

## 2017-01-18 HISTORY — DX: Acquired absence of kidney: Z90.5

## 2017-01-18 HISTORY — PX: CYSTOSCOPY WITH RETROGRADE PYELOGRAM, URETEROSCOPY AND STENT PLACEMENT: SHX5789

## 2017-01-18 HISTORY — DX: Calculus of kidney: N20.0

## 2017-01-18 HISTORY — DX: Personal history of adenomatous and serrated colon polyps: Z86.0101

## 2017-01-18 HISTORY — DX: Personal history of colonic polyps: Z86.010

## 2017-01-18 HISTORY — DX: Calculus of ureter: N20.1

## 2017-01-18 HISTORY — DX: Anemia, unspecified: D64.9

## 2017-01-18 HISTORY — DX: Gastro-esophageal reflux disease without esophagitis: K21.9

## 2017-01-18 HISTORY — DX: Personal history of other diseases of the digestive system: Z87.19

## 2017-01-18 HISTORY — DX: Personal history of pneumonia (recurrent): Z87.01

## 2017-01-18 HISTORY — DX: Urgency of urination: R39.15

## 2017-01-18 HISTORY — DX: Personal history of urinary calculi: Z87.442

## 2017-01-18 HISTORY — DX: Personal history of other diseases of the female genital tract: Z87.42

## 2017-01-18 HISTORY — DX: Personal history of other benign neoplasm: Z86.018

## 2017-01-18 HISTORY — DX: Personal history of other infectious and parasitic diseases: Z86.19

## 2017-01-18 HISTORY — PX: HOLMIUM LASER APPLICATION: SHX5852

## 2017-01-18 HISTORY — DX: Dermatitis, unspecified: L30.9

## 2017-01-18 SURGERY — CYSTOURETEROSCOPY, WITH RETROGRADE PYELOGRAM AND STENT INSERTION
Anesthesia: General | Laterality: Left

## 2017-01-18 MED ORDER — KETOROLAC TROMETHAMINE 30 MG/ML IJ SOLN
INTRAMUSCULAR | Status: DC | PRN
  Administered 2017-01-18: 30 mg via INTRAVENOUS

## 2017-01-18 MED ORDER — PROPOFOL 10 MG/ML IV BOLUS
INTRAVENOUS | Status: DC | PRN
  Administered 2017-01-18: 200 mg via INTRAVENOUS

## 2017-01-18 MED ORDER — DEXAMETHASONE SODIUM PHOSPHATE 4 MG/ML IJ SOLN
INTRAMUSCULAR | Status: DC | PRN
  Administered 2017-01-18: 10 mg via INTRAVENOUS

## 2017-01-18 MED ORDER — CIPROFLOXACIN HCL 500 MG PO TABS: 500.0000 mg | ORAL_TABLET | Freq: Two times a day (BID) | ORAL | 0 refills | Status: DC

## 2017-01-18 MED ORDER — FENTANYL CITRATE (PF) 100 MCG/2ML IJ SOLN
INTRAMUSCULAR | Status: AC
  Filled 2017-01-18: qty 2

## 2017-01-18 MED ORDER — GENTAMICIN SULFATE 40 MG/ML IJ SOLN
5.0000 mg/kg | INTRAVENOUS | Status: DC
  Filled 2017-01-18: qty 12.25

## 2017-01-18 MED ORDER — GENTAMICIN SULFATE 40 MG/ML IJ SOLN
420.0000 mg | Freq: Once | INTRAVENOUS | Status: AC
  Administered 2017-01-18: 420 mg via INTRAVENOUS
  Filled 2017-01-18: qty 10.5

## 2017-01-18 MED ORDER — EPHEDRINE 5 MG/ML INJ
INTRAVENOUS | Status: AC
  Filled 2017-01-18: qty 10

## 2017-01-18 MED ORDER — PROPOFOL 10 MG/ML IV BOLUS
INTRAVENOUS | Status: AC
  Filled 2017-01-18: qty 20

## 2017-01-18 MED ORDER — EPHEDRINE SULFATE-NACL 50-0.9 MG/10ML-% IV SOSY
PREFILLED_SYRINGE | INTRAVENOUS | Status: DC | PRN
  Administered 2017-01-18 (×3): 10 mg via INTRAVENOUS

## 2017-01-18 MED ORDER — OXYCODONE HCL 5 MG PO TABS: 5.0000 mg | ORAL_TABLET | ORAL | 0 refills | Status: DC | PRN

## 2017-01-18 MED ORDER — LIDOCAINE 2% (20 MG/ML) 5 ML SYRINGE
INTRAMUSCULAR | Status: DC | PRN
  Administered 2017-01-18: 100 mg via INTRAVENOUS

## 2017-01-18 MED ORDER — MIDAZOLAM HCL 2 MG/2ML IJ SOLN
INTRAMUSCULAR | Status: AC
  Filled 2017-01-18: qty 2

## 2017-01-18 MED ORDER — ONDANSETRON HCL 4 MG/2ML IJ SOLN
INTRAMUSCULAR | Status: DC | PRN
  Administered 2017-01-18: 4 mg via INTRAVENOUS

## 2017-01-18 MED ORDER — DEXAMETHASONE SODIUM PHOSPHATE 10 MG/ML IJ SOLN
INTRAMUSCULAR | Status: AC
  Filled 2017-01-18: qty 1

## 2017-01-18 MED ORDER — LIDOCAINE 2% (20 MG/ML) 5 ML SYRINGE
INTRAMUSCULAR | Status: AC
  Filled 2017-01-18: qty 5

## 2017-01-18 MED ORDER — LACTATED RINGERS IV SOLN
INTRAVENOUS | Status: DC
  Administered 2017-01-18 (×2): via INTRAVENOUS
  Filled 2017-01-18: qty 1000

## 2017-01-18 MED ORDER — KETOROLAC TROMETHAMINE 10 MG PO TABS: 10.0000 mg | ORAL_TABLET | Freq: Four times a day (QID) | ORAL | 1 refills | Status: DC | PRN

## 2017-01-18 MED ORDER — ONDANSETRON HCL 4 MG/2ML IJ SOLN
INTRAMUSCULAR | Status: AC
  Filled 2017-01-18: qty 2

## 2017-01-18 MED ORDER — FENTANYL CITRATE (PF) 100 MCG/2ML IJ SOLN
INTRAMUSCULAR | Status: DC | PRN
  Administered 2017-01-18: 50 ug via INTRAVENOUS
  Administered 2017-01-18: 25 ug via INTRAVENOUS
  Administered 2017-01-18: 50 ug via INTRAVENOUS

## 2017-01-18 MED ORDER — MIDAZOLAM HCL 5 MG/5ML IJ SOLN
INTRAMUSCULAR | Status: DC | PRN
  Administered 2017-01-18 (×2): 1 mg via INTRAVENOUS

## 2017-01-18 SURGICAL SUPPLY — 27 items
BAG DRAIN URO-CYSTO SKYTR STRL (DRAIN) ×2 IMPLANT
BAG DRN UROCATH (DRAIN) ×1
BASKET LASER NITINOL 1.9FR (BASKET) ×2 IMPLANT
BSKT STON RTRVL 120 1.9FR (BASKET) ×1
CATH INTERMIT  6FR 70CM (CATHETERS) IMPLANT
CLOTH BEACON ORANGE TIMEOUT ST (SAFETY) ×4 IMPLANT
FIBER LASER FLEXIVA 200 (UROLOGICAL SUPPLIES) ×2 IMPLANT
FIBER LASER FLEXIVA 365 (UROLOGICAL SUPPLIES) IMPLANT
FIBER LASER TRAC TIP (UROLOGICAL SUPPLIES) IMPLANT
GLOVE BIO SURGEON STRL SZ7.5 (GLOVE) ×2 IMPLANT
GOWN STRL REUS W/TWL LRG LVL3 (GOWN DISPOSABLE) ×2 IMPLANT
GOWN STRL REUS W/TWL XL LVL3 (GOWN DISPOSABLE) IMPLANT
GUIDEWIRE ANG ZIPWIRE 038X150 (WIRE) ×2 IMPLANT
GUIDEWIRE STR DUAL SENSOR (WIRE) ×2 IMPLANT
INFUSOR MANOMETER BAG 3000ML (MISCELLANEOUS) ×2 IMPLANT
IV NS 1000ML (IV SOLUTION) ×2
IV NS 1000ML BAXH (IV SOLUTION) ×1 IMPLANT
IV NS IRRIG 3000ML ARTHROMATIC (IV SOLUTION) ×2 IMPLANT
KIT RM TURNOVER CYSTO AR (KITS) ×2 IMPLANT
MANIFOLD NEPTUNE II (INSTRUMENTS) IMPLANT
NS IRRIG 500ML POUR BTL (IV SOLUTION) ×2 IMPLANT
PACK CYSTO (CUSTOM PROCEDURE TRAY) ×2 IMPLANT
SHEATH ACCESS URETERAL 24CM (SHEATH) ×2 IMPLANT
STENT URET 6FRX26 CONTOUR (STENTS) ×2 IMPLANT
SYRINGE 10CC LL (SYRINGE) ×2 IMPLANT
TUBE CONNECTING 12X1/4 (SUCTIONS) IMPLANT
TUBE FEEDING 8FR 16IN STR KANG (MISCELLANEOUS) ×2 IMPLANT

## 2017-01-18 NOTE — Discharge Instructions (Signed)
1 - You may have urinary urgency (bladder spasms) and bloody urine on / off with stent in place. This is normal. ° °2 - Call MD or go to ER for fever >102, severe pain / nausea / vomiting not relieved by medications, or acute change in medical status ° °[facility] °[phone] °Post Ureteroscopy With or Without Stent Instructions ° °Definitions: ° °Ureter: The duct that transports urine from the kidney to the bladder. °Stent:   A plastic hollow tube that is placed into the ureter, from the kidney to the  bladder to prevent the ureter from swelling shut. ° °GENERAL INSTRUCTIONS: ° °Despite the fact that no skin incisions were used, the area around the ureter and bladder is raw and irritated. The stent is a foreign body which will further irritate the bladder wall. This irritation is manifested by increased frequency of urination, both day and night, and by an increase in the urge to urinate. In some, the urge to urinate is present almost always. Sometimes the urge is strong enough that you may not be able to stop yourself from urinating. The only real cure is to remove the stent and then give time for the bladder wall to heal which can't be done until the danger of the ureter swelling shut has passed, which varies. ° °You may see some blood in your urine while the stent is in place and a few days afterwards. Do not be alarmed, even if the urine was clear for a while. Get off your feet and drink lots of fluids until clearing occurs. If you start to pass clots or don't improve, call us. ° °DIET: °You may return to your normal diet immediately. Because of the raw surface of your bladder, alcohol, spicy foods, acid type foods and drinks with caffeine may cause irritation or frequency and should be used in moderation. To keep your urine flowing freely and to avoid constipation, drink plenty of fluids during the day ( 8-10 glasses ). °Tip: Avoid cranberry juice because it is very  acidic. ° °ACTIVITY: °Your physical activity doesn't need to be restricted. However, if you are very active, you may see some blood in your urine. We suggest that you reduce your activity under these circumstances until the bleeding has stopped. ° °BOWELS: °It is important to keep your bowels regular during the postoperative period. Straining with bowel movements can cause bleeding. A bowel movement every other day is reasonable. Use a mild laxative if needed, such as Milk of Magnesia 2-3 tablespoons, or 2 Dulcolax tablets. Call if you continue to have problems. If you have been taking narcotics for pain, before, during or after your surgery, you may be constipated. Take a laxative if necessary. ° ° °MEDICATION: °You should resume your pre-surgery medications unless told not to. In addition you will often be given an antibiotic to prevent infection. These should be taken as prescribed until the bottles are finished unless you are having an unusual reaction to one of the drugs. ° °PROBLEMS YOU SHOULD REPORT TO US: °· Fevers over 100.5 Fahrenheit. °· Heavy bleeding, or clots ( See above notes about blood in urine ). °· Inability to urinate. °· Drug reactions ( hives, rash, nausea, vomiting, diarrhea ). °· Severe burning or pain with urination that is not improving. ° °FOLLOW-UP: °You will need a follow-up appointment to monitor your progress. Call for this appointment at the number listed above. Usually the first appointment will be about three to fourteen days after your surgery. ° ° ° ° ° °  Post Anesthesia Home Care Instructions ° °Activity: °Get plenty of rest for the remainder of the day. A responsible individual must stay with you for 24 hours following the procedure.  °For the next 24 hours, DO NOT: °-Drive a car °-Operate machinery °-Drink alcoholic beverages °-Take any medication unless instructed by your physician °-Make any legal decisions or sign important papers. ° °Meals: °Start with liquid foods such as  gelatin or soup. Progress to regular foods as tolerated. Avoid greasy, spicy, heavy foods. If nausea and/or vomiting occur, drink only clear liquids until the nausea and/or vomiting subsides. Call your physician if vomiting continues. ° °Special Instructions/Symptoms: °Your throat may feel dry or sore from the anesthesia or the breathing tube placed in your throat during surgery. If this causes discomfort, gargle with warm salt water. The discomfort should disappear within 24 hours. ° °If you had a scopolamine patch placed behind your ear for the management of post- operative nausea and/or vomiting: ° °1. The medication in the patch is effective for 72 hours, after which it should be removed.  Wrap patch in a tissue and discard in the trash. Wash hands thoroughly with soap and water. °2. You may remove the patch earlier than 72 hours if you experience unpleasant side effects which may include dry mouth, dizziness or visual disturbances. °3. Avoid touching the patch. Wash your hands with soap and water after contact with the patch. °  ° ° °

## 2017-01-18 NOTE — H&P (Signed)
Lynn Estrada is an 56 y.o. female.    Chief Complaint: Pre-op LEFT 1st stage ureteroscopic stone manipulation  HPI:   1 - Left Ureteral Stone - 28mm left proximal ureteral stone + 3mm lower pole with hydro + stranding by ER Ct on eval abdonabdominal pain and fevers 12/31/16. No Rt sided stones. Stones 600HU. Cr 1.05. Left JJ stent placed 12/31/16 for urgent renal decompression.   2 -  History of Urosepsis - Urosepsis early 12/2016 from Klebsiella in setting of obstructing stone. Treated with IV transition to PO ABX and renal decompression. She has been on Cipro prior to surgeyr today to reduce colonization based on most recent CX's.   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  PMH sig for Rt hemi nephrectomy, hernia surgery, back surgery (no deficits). No CV disease / blood thinners.  Today "[place]" is seen to proceed with LEFT 1st stage ureteroscopy for multifocal large left proximal ureteral and lower pole renal stones. No interval fevers.   Past Medical History:  Diagnosis Date  . Anemia   . Eczema   . GERD (gastroesophageal reflux disease)   . History of adenomatous polyp of colon    08-03-2016  tubular adenoma x2 and hyperplastic  . History of chronic gastritis 08/03/2016  . History of ectopic pregnancy 1988   s/p  left salpingectomy  . History of endometriosis   . History of kidney stones   . History of partial nephrectomy    right pelvis for very large stone  . History of pneumonia 07/02/2016   CAP  . History of sepsis    07-01-2016  sepsis w/ pyelonephritis, CAP /   12-31-2016  urosepsis w/ kidney stone obstruction  . History of uterine leiomyoma   . Hypertension   . Left ureteral stone   . Nephrolithiasis    left obstructive stone and right non-obstructive stone  per CT 12-31-2016  . Urgency of urination     Past Surgical History:  Procedure Laterality Date  . ABDOMINAL HYSTERECTOMY   01/20/2007   w/ Lysis Adhesions/  Left salpingoophorectomy/  Right Salpingectomy  . CHOLECYSTECTOMY  1994   and Right Partial Nephrectomy (pelvis for very large stone)  . CYSTOSCOPY W/ URETERAL STENT PLACEMENT Left 12/31/2016   Procedure: CYSTOSCOPY WITH RETROGRADE PYELOGRAM/ LEFT URETERAL STENT PLACEMENT;  Surgeon: Alexis Frock, MD;  Location: [facility];  Service: Urology;  Laterality: Left;  . ECTOPIC PREGNANCY SURGERY  1988   Left Salpingectomy  . LUMBAR DISC SURGERY  01/1999   right L5 -- S1  . RE-EXPLORATION LUMBAR/  LAMINECTOMY AND MICRODISKECTOMY  10/14/2001   right L5 -- S1  . TUBAL LIGATION Bilateral 10/17/1999   PPTL  . UMBILICAL HERNIA REPAIR  age 76    Family History  Problem Relation Age of Onset  . Hypertension Sister   . Eczema Sister   . Lupus Sister   . Sarcoidosis Mother   . Prostate cancer Father   . Gout Maternal Grandmother   . Diabetes Maternal Grandmother        leg amputations  . Breast cancer Maternal Aunt   . Stomach cancer Neg Hx   . Colon cancer Neg Hx    Social History:  reports that she quit smoking about 20 years ago. Her smoking use included Cigarettes. She has a 3.75 pack-year smoking history. She has never used smokeless tobacco. She reports that she drinks alcohol. She reports that  she does not use drugs.  Allergies:  Allergies  Allergen Reactions  . Bactrim [Sulfamethoxazole-Trimethoprim] Rash  . Latex Rash    No prescriptions prior to admission.    No results found for this or any previous visit (from the past 48 hour(s)). No results found.  Review of Systems  Constitutional: Negative.  Negative for chills and fever.  HENT: Negative.   Eyes: Negative.   Respiratory: Negative.   Cardiovascular: Negative.   Gastrointestinal: Negative.   Genitourinary: Positive for urgency.  Musculoskeletal: Negative.   Skin: Negative.   Neurological: Negative.   Endo/Heme/Allergies: Negative.   Psychiatric/Behavioral: Negative.     Height 6'  1" (1.854 m), weight 98.9 kg (218 lb). Physical Exam  Constitutional: She appears well-developed.  HENT:  Head: Normocephalic.  Eyes: Pupils are equal, round, and reactive to light.  Neck: Normal range of motion.  Cardiovascular: Normal rate.   Respiratory: Effort normal.  GI: Soft.  Priors scars w/o hernias.   Genitourinary:  Genitourinary Comments: NO CVAT  Musculoskeletal: Normal range of motion.  Neurological: She is alert.  Skin: Skin is warm.  Psychiatric: She has a normal mood and affect.     Assessment/Plan  Proceed as planned with LEFT 1st stage ureteroscopy for large stone. Risks, benefits, alternatives, expected peri-op course, plan for staged approach (given stone volume) discussed previously and reiterated today.   Alexis Frock, MD 01/18/2017, 6:08 AM

## 2017-01-18 NOTE — Anesthesia Preprocedure Evaluation (Addendum)
Anesthesia Evaluation  Patient identified by MRN, date of birth, ID band Patient awake    Reviewed: Allergy & Precautions, NPO status , Patient's Chart, lab work & pertinent test results  Airway Mallampati: II  TM Distance: >3 FB     Dental   Pulmonary former smoker,    breath sounds clear to auscultation       Cardiovascular hypertension, Pt. on medications  Rhythm:Regular Rate:Normal  ECG: NSR, rate 71   Neuro/Psych    GI/Hepatic Neg liver ROS, Medicated and Controlled,  Endo/Other    Renal/GU Renal disease     Musculoskeletal   Abdominal (+) + obese,   Peds  Hematology  (+) anemia ,   Anesthesia Other Findings Left nares ring  Reproductive/Obstetrics                            Anesthesia Physical  Anesthesia Plan  ASA: II  Anesthesia Plan: General   Post-op Pain Management:    Induction: Intravenous  PONV Risk Score and Plan: 3 and Ondansetron, Dexamethasone, Propofol and Midazolam  Airway Management Planned: LMA  Additional Equipment:   Intra-op Plan:   Post-operative Plan: Extubation in OR  Informed Consent: I have reviewed the patients History and Physical, chart, labs and discussed the procedure including the risks, benefits and alternatives for the proposed anesthesia with the patient or authorized representative who has indicated his/her understanding and acceptance.   Dental advisory given  Plan Discussed with: CRNA and Surgeon  Anesthesia Plan Comments:        Anesthesia Quick Evaluation

## 2017-01-18 NOTE — Anesthesia Procedure Notes (Signed)
Procedure Name: LMA Insertion Date/Time: 01/18/2017 1:18 PM Performed by: Murvin Natal Pre-anesthesia Checklist: Patient identified, Emergency Drugs available, Suction available and Patient being monitored Patient Re-evaluated:Patient Re-evaluated prior to induction Oxygen Delivery Method: Circle system utilized Preoxygenation: Pre-oxygenation with 100% oxygen Induction Type: IV induction Ventilation: Mask ventilation without difficulty LMA: LMA inserted LMA Size: 4.0 Number of attempts: 1 Airway Equipment and Method: Bite block Placement Confirmation: positive ETCO2 Tube secured with: Tape Dental Injury: Teeth and Oropharynx as per pre-operative assessment

## 2017-01-18 NOTE — Anesthesia Postprocedure Evaluation (Signed)
Anesthesia Post Note  Patient: Lynn Estrada  Procedure(s) Performed: Procedure(s) (LRB): 1ST STAGE CYSTOSCOPY WITH RETROGRADE PYELOGRAM, URETEROSCOPY AND STENT REPLACEMENT (Left) HOLMIUM LASER APPLICATION (Left)     Patient location during evaluation: PACU Anesthesia Type: General Level of consciousness: awake and alert Pain management: pain level controlled Vital Signs Assessment: post-procedure vital signs reviewed and stable Respiratory status: spontaneous breathing, nonlabored ventilation, respiratory function stable and patient connected to nasal cannula oxygen Cardiovascular status: blood pressure returned to baseline and stable Postop Assessment: no signs of nausea or vomiting Anesthetic complications: no    Last Vitals:  Vitals:   01/18/17 1600 01/18/17 1615  BP: 139/83 135/89  Pulse: 89 89  Resp: 12 16  Temp:      Last Pain:  Vitals:   01/18/17 1306  TempSrc:   PainSc: 3                  Ryan P Ellender

## 2017-01-18 NOTE — Brief Op Note (Signed)
01/18/2017  3:11 PM  PATIENT:  Lynn Estrada  56 y.o. female  PRE-OPERATIVE DIAGNOSIS:  LARGE LEFT URETERAL STONE AND RENAL STONE  POST-OPERATIVE DIAGNOSIS:  LARGE LEFT URETERAL STONE AND RENAL STONE  PROCEDURE:  Procedure(s): 1ST STAGE CYSTOSCOPY WITH RETROGRADE PYELOGRAM, URETEROSCOPY AND STENT REPLACEMENT (Left) HOLMIUM LASER APPLICATION (Left)  SURGEON:  Surgeon(s) and Role:    Alexis Frock, MD - Primary  PHYSICIAN ASSISTANT:   ASSISTANTS: none   ANESTHESIA:   general  EBL:  Total I/O In: 1000 [I.V.:1000] Out: -   BLOOD ADMINISTERED:none  DRAINS: none   LOCAL MEDICATIONS USED:  NONE  SPECIMEN:  No Specimen  DISPOSITION OF SPECIMEN:  N/A  COUNTS:  YES  TOURNIQUET:  * No tourniquets in log *  DICTATION: .Other Dictation: Dictation Number [id number]  PLAN OF CARE: Other Dictation: Dictation Number [id number]  PATIENT DISPOSITION:  PACU - hemodynamically stable.   Delay start of Pharmacological VTE agent (>24hrs) due to surgical blood loss or risk of bleeding: yes

## 2017-01-18 NOTE — Interval H&P Note (Signed)
History and Physical Interval Note:  01/18/2017 1:49 PM  [place]  has presented today for surgery, with the diagnosis of LARGE LEFT URETERAL STONE AND RENAL STONE  The various methods of treatment have been discussed with the patient and family. After consideration of risks, benefits and other options for treatment, the patient has consented to  Procedure(s): 1ST STAGE CYSTOSCOPY WITH RETROGRADE PYELOGRAM, URETEROSCOPY AND STENT REPLACEMENT (Left) HOLMIUM LASER APPLICATION (Left) as a surgical intervention .  The patient's history has been reviewed, patient examined, no change in status, stable for surgery.  I have reviewed the patient's chart and labs.  Questions were answered to the patient's satisfaction.     Lynn Estrada

## 2017-01-18 NOTE — Transfer of Care (Signed)
  Last Vitals:  Vitals:   01/18/17 1241  BP: 127/79  Pulse: 77  Resp: 18  Temp: 36.9 C    Last Pain:  Vitals:   01/18/17 1306  TempSrc:   PainSc: 3       Patients Stated Pain Goal: 8 (01/18/17 1306)  Immediate Anesthesia Transfer of Care Note  Patient: Lynn Estrada  Procedure(s) Performed: Procedure(s) (LRB): 1ST STAGE CYSTOSCOPY WITH RETROGRADE PYELOGRAM, URETEROSCOPY AND STENT REPLACEMENT (Left) HOLMIUM LASER APPLICATION (Left)  Patient Location: PACU  Anesthesia Type: General  Level of Consciousness: awake, alert  and oriented  Airway & Oxygen Therapy: Patient Spontanous Breathing and Patient connected to nasal cannula oxygen  Post-op Assessment: Report given to PACU RN and Post -op Vital signs reviewed and stable  Post vital signs: Reviewed and stable  Complications: No apparent anesthesia complications

## 2017-01-21 ENCOUNTER — Encounter (HOSPITAL_BASED_OUTPATIENT_CLINIC_OR_DEPARTMENT_OTHER): Payer: Self-pay | Admitting: Urology

## 2017-01-21 ENCOUNTER — Telehealth: Payer: Self-pay

## 2017-01-21 NOTE — Op Note (Signed)
NAME:  Lynn Estrada, Lynn Estrada                  ACCOUNT NO.:  MEDICAL RECORD NO.:  [id number]  LOCATION:                                 FACILITY:  PHYSICIAN:  Alexis Frock, MD     DATE OF BIRTH:  05-18-1961  DATE OF PROCEDURE: 01/18/2017                               OPERATIVE REPORT   DIAGNOSES:  Large-volume left renal ureteral stone, history of urosepsis.  PROCEDURES: 1. Cystoscopy with left retrograde pyelogram and interpretation. 2. Left first-stage ureteroscopy with laser lithotripsy. 3. Exchange of left ureteral stent, 6 x 26 Contour, no tether.  ESTIMATED BLOOD LOSS:  Nil.  COMPLICATION:  None.  SPECIMEN:  None.  FINDINGS: 1. Multifocal left proximal ureteral and renal stone, estimated total     volume approximately 2 cm. 2. Resolution approximately 60-70% of left-sided stone volume today     with laser lithotripsy and basket extraction. 3. Successful replacement of left ureteral stent, proximal in the     renal pelvis and distal in the urinary bladder.  INDICATION:  Ms. Plair is a 56 year old lady with recent history of very large left proximal ureteral and renal stones obstructing with urosepsis.  She underwent stenting at that time, intravenous antibiotics and cleared her infectious parameters in terms of definitive stone management.  Options were discussed including staged ureteroscopy given her stone volume versus percutaneous approach and she wished to proceed with staged ureteroscopy.  She has been on preoperative antibiotics according to the most recent cultures to reduce colonization.  Informed consent was obtained and placed in the medical record.  PROCEDURE IN DETAIL:  The patient being [place] was verified. Procedure being left first-stage ureteroscopic stone manipulation was confirmed.  Procedure was carried out.  Time-out was performed. Intravenous antibiotics were administered.  General LMA anesthesia introduced.  The patient was placed into  a low lithotomy position. Sterile field was created by prepping and draping the patient's vagina, introitus, and proximal thighs using iodine.  Next, cystourethroscopy was performed using a rigid cystoscope with offset lens.  Inspection of the urinary bladder revealed no diverticula, calcifications, papular lesions.  The distal end of the left ureteral stent was seen, it was grasped, brought to the level of the urethral meatus, through which, a 0.038 Zip wire was advanced to the level of the upper pole and the stent was exchanged for an open-ended catheter and left retrograde pyelogram was obtained.  Left retrograde pyelogram demonstrated a single left ureter with single- system left kidney.  There was a mobile filling defect at the area of the ureteropelvic junction consistent with known large stone as well as a separate calcification in lower pole.  The Zip wire was once again advanced, set aside as a safety wire.  An 8-French feeding tube placed in the urinary bladder for pressure release, and semi-rigid ureteroscopy was performed to the distal four-fifths of the left ureter alongside a separate Sensor working wire.  No mucosal abnormalities were found.  The semi-rigid scope was exchanged for 12/14, 24-cm ureteral access sheath at the level of the proximal ureter using continuous fluoroscopic guidance, taking exquisite care to not pass proximal to the previously- visualized portion.  Next, flexible  digital ureteroscopy was performed using a single-channel digital ureteroscope, inspection of the proximal ureter and systematic inspection of the kidney was performed.  The UPJ stone was retrogradely positioned into an upper mid-calyx.  The lower pole of stone was grasped with an Escape basket and also retrogradely positioned into a mid-pole calyx to allow better fragmentation and a dusting technique was used with settings of 0.2 joules and 20 Hz, and approximately 40-50% of the stone  volume was ablated with this technique over period of approximately an hour and 15 minutes.  This inherently generated enumerable fragments as anticipated, there were all quite small 3 mm or less.  Additional popcorn technique was used at settings of 1.5 joules and 20 Hz creating a field of new dust in these calices. Following these maneuvers, we achieved the goals of the first-stage procedure today.  Given the large volume stone, it was clearly felt that second-stage procedure would be warranted in several weeks as planned. The access sheath was removed under continuous vision, no mucosal abnormalities were found, and finally, a new 6 x 26 Contour-type stent was placed under fluoroscopic guidance.  Good proximal and distal deployment were noted.  Bladder was emptied per cystoscope, procedure was terminated.  The patient tolerated the procedure well.  There were no immediate periprocedural complications.  The patient was taken to the postanesthesia care unit in stable condition.          ______________________________ Alexis Frock, MD     TM/MEDQ  D:  01/18/2017  T:  01/19/2017  Job:  [id number]

## 2017-01-21 NOTE — Telephone Encounter (Signed)
Pt called and said that she was feeling drowsy and had blurred vision all day. Had surgery done on the 20th and they changed her BP medication and also some medication. Advised pt that she should come in to see [place] today before we close if it was urgent or if she thought she could wait until tomorrow to come into the office. Also advised that if she wasn't able to make it to our office that she should either go to the Urgent Care by [facility] or go to the nearest ED. Pt was advised the likely issue was the pain medication since it was the newest medicine that she has been put on. Pt verbalized understanding.

## 2017-02-01 ENCOUNTER — Encounter (HOSPITAL_BASED_OUTPATIENT_CLINIC_OR_DEPARTMENT_OTHER): Payer: Self-pay

## 2017-02-01 ENCOUNTER — Ambulatory Visit (HOSPITAL_BASED_OUTPATIENT_CLINIC_OR_DEPARTMENT_OTHER): Payer: BLUE CROSS/BLUE SHIELD | Admitting: Anesthesiology

## 2017-02-01 ENCOUNTER — Ambulatory Visit (HOSPITAL_BASED_OUTPATIENT_CLINIC_OR_DEPARTMENT_OTHER)
Admission: RE | Admit: 2017-02-01 | Discharge: 2017-02-01 | Disposition: A | Discharge status: Home / Self Care | Payer: BLUE CROSS/BLUE SHIELD | Source: Ambulatory Visit | Attending: Urology | Admitting: Urology

## 2017-02-01 ENCOUNTER — Encounter (HOSPITAL_BASED_OUTPATIENT_CLINIC_OR_DEPARTMENT_OTHER)
Admission: RE | Disposition: A | Discharge status: Home / Self Care | Payer: Self-pay | Source: Ambulatory Visit | Attending: Urology

## 2017-02-01 DIAGNOSIS — Z905 Acquired absence of kidney: Secondary | ICD-10-CM | POA: Diagnosis not present

## 2017-02-01 DIAGNOSIS — Z91040 Latex allergy status: Secondary | ICD-10-CM | POA: Insufficient documentation

## 2017-02-01 DIAGNOSIS — Z87891 Personal history of nicotine dependence: Secondary | ICD-10-CM | POA: Insufficient documentation

## 2017-02-01 DIAGNOSIS — Z882 Allergy status to sulfonamides status: Secondary | ICD-10-CM | POA: Diagnosis not present

## 2017-02-01 DIAGNOSIS — D649 Anemia, unspecified: Secondary | ICD-10-CM | POA: Diagnosis not present

## 2017-02-01 DIAGNOSIS — I10 Essential (primary) hypertension: Secondary | ICD-10-CM | POA: Diagnosis not present

## 2017-02-01 DIAGNOSIS — K219 Gastro-esophageal reflux disease without esophagitis: Secondary | ICD-10-CM | POA: Insufficient documentation

## 2017-02-01 DIAGNOSIS — N202 Calculus of kidney with calculus of ureter: Secondary | ICD-10-CM | POA: Diagnosis present

## 2017-02-01 DIAGNOSIS — N132 Hydronephrosis with renal and ureteral calculous obstruction: Secondary | ICD-10-CM | POA: Diagnosis not present

## 2017-02-01 DIAGNOSIS — N200 Calculus of kidney: Secondary | ICD-10-CM | POA: Diagnosis not present

## 2017-02-01 HISTORY — PX: HOLMIUM LASER APPLICATION: SHX5852

## 2017-02-01 HISTORY — PX: CYSTOSCOPY WITH RETROGRADE PYELOGRAM, URETEROSCOPY AND STENT PLACEMENT: SHX5789

## 2017-02-01 LAB — POCT I-STAT, CHEM 8
BUN: 16 mg/dL (ref 6–20)
CALCIUM ION: 1.22 mmol/L (ref 1.15–1.40)
CHLORIDE: 106 mmol/L (ref 101–111)
Creatinine, Ser: 0.8 mg/dL (ref 0.44–1.00)
GLUCOSE: 128 mg/dL — AB (ref 65–99)
HCT: 35 % — ABNORMAL LOW (ref 36.0–46.0)
Hemoglobin: 11.9 g/dL — ABNORMAL LOW (ref 12.0–15.0)
Potassium: 3.7 mmol/L (ref 3.5–5.1)
Sodium: 142 mmol/L (ref 135–145)
TCO2: 26 mmol/L (ref 0–100)

## 2017-02-01 SURGERY — CYSTOURETEROSCOPY, WITH RETROGRADE PYELOGRAM AND STENT INSERTION
Anesthesia: General | Site: Ureter | Laterality: Left

## 2017-02-01 MED ORDER — GENTAMICIN SULFATE 40 MG/ML IJ SOLN
5.0000 mg/kg | INTRAVENOUS | Status: AC
  Administered 2017-02-01: 420 mg via INTRAVENOUS
  Filled 2017-02-01: qty 10.5

## 2017-02-01 MED ORDER — PROPOFOL 10 MG/ML IV BOLUS
INTRAVENOUS | Status: AC
  Filled 2017-02-01: qty 20

## 2017-02-01 MED ORDER — KETOROLAC TROMETHAMINE 30 MG/ML IJ SOLN
INTRAMUSCULAR | Status: DC | PRN
  Administered 2017-02-01: 30 mg via INTRAVENOUS

## 2017-02-01 MED ORDER — SODIUM CHLORIDE 0.9 % IR SOLN
Status: DC | PRN
  Administered 2017-02-01: 6000 mL

## 2017-02-01 MED ORDER — LACTATED RINGERS IV SOLN
INTRAVENOUS | Status: DC
  Administered 2017-02-01: 08:00:00 via INTRAVENOUS
  Filled 2017-02-01: qty 1000

## 2017-02-01 MED ORDER — PROPOFOL 10 MG/ML IV BOLUS
INTRAVENOUS | Status: DC | PRN
  Administered 2017-02-01: 200 mg via INTRAVENOUS

## 2017-02-01 MED ORDER — FENTANYL CITRATE (PF) 100 MCG/2ML IJ SOLN
INTRAMUSCULAR | Status: AC
  Filled 2017-02-01: qty 2

## 2017-02-01 MED ORDER — KETOROLAC TROMETHAMINE 10 MG PO TABS: 10.0000 mg | ORAL_TABLET | Freq: Four times a day (QID) | ORAL | 1 refills | Status: DC | PRN

## 2017-02-01 MED ORDER — FENTANYL CITRATE (PF) 100 MCG/2ML IJ SOLN
25.0000 ug | INTRAMUSCULAR | Status: DC | PRN
  Filled 2017-02-01: qty 1

## 2017-02-01 MED ORDER — MIDAZOLAM HCL 5 MG/5ML IJ SOLN
INTRAMUSCULAR | Status: DC | PRN
  Administered 2017-02-01: 2 mg via INTRAVENOUS

## 2017-02-01 MED ORDER — LIDOCAINE 2% (20 MG/ML) 5 ML SYRINGE
INTRAMUSCULAR | Status: DC | PRN
  Administered 2017-02-01: 100 mg via INTRAVENOUS

## 2017-02-01 MED ORDER — ONDANSETRON HCL 4 MG/2ML IJ SOLN
4.0000 mg | Freq: Four times a day (QID) | INTRAMUSCULAR | Status: DC | PRN
Start: 2017-02-01 — End: 2017-02-01
  Filled 2017-02-01: qty 2

## 2017-02-01 MED ORDER — OXYCODONE HCL 5 MG/5ML PO SOLN
5.0000 mg | Freq: Once | ORAL | Status: DC | PRN
  Filled 2017-02-01: qty 5

## 2017-02-01 MED ORDER — EPHEDRINE 5 MG/ML INJ
INTRAVENOUS | Status: AC
  Filled 2017-02-01: qty 10

## 2017-02-01 MED ORDER — DEXAMETHASONE SODIUM PHOSPHATE 4 MG/ML IJ SOLN
INTRAMUSCULAR | Status: DC | PRN
  Administered 2017-02-01: 10 mg via INTRAVENOUS

## 2017-02-01 MED ORDER — LIDOCAINE 2% (20 MG/ML) 5 ML SYRINGE
INTRAMUSCULAR | Status: AC
  Filled 2017-02-01: qty 5

## 2017-02-01 MED ORDER — FENTANYL CITRATE (PF) 100 MCG/2ML IJ SOLN
INTRAMUSCULAR | Status: DC | PRN
  Administered 2017-02-01 (×2): 25 ug via INTRAVENOUS
  Administered 2017-02-01: 50 ug via INTRAVENOUS

## 2017-02-01 MED ORDER — DEXAMETHASONE SODIUM PHOSPHATE 10 MG/ML IJ SOLN
INTRAMUSCULAR | Status: AC
  Filled 2017-02-01: qty 1

## 2017-02-01 MED ORDER — DEXTROSE 5 % IV SOLN
5.0000 mg/kg | INTRAVENOUS | Status: DC
  Filled 2017-02-01: qty 12.25

## 2017-02-01 MED ORDER — ONDANSETRON HCL 4 MG/2ML IJ SOLN
INTRAMUSCULAR | Status: AC
  Filled 2017-02-01: qty 2

## 2017-02-01 MED ORDER — CIPROFLOXACIN HCL 500 MG PO TABS: 500.0000 mg | ORAL_TABLET | Freq: Two times a day (BID) | ORAL | 0 refills | Status: DC

## 2017-02-01 MED ORDER — OXYCODONE HCL 5 MG PO TABS
5.0000 mg | ORAL_TABLET | Freq: Once | ORAL | Status: DC | PRN
  Filled 2017-02-01: qty 1

## 2017-02-01 MED ORDER — MIDAZOLAM HCL 2 MG/2ML IJ SOLN
INTRAMUSCULAR | Status: AC
  Filled 2017-02-01: qty 2

## 2017-02-01 MED ORDER — KETOROLAC TROMETHAMINE 30 MG/ML IJ SOLN
INTRAMUSCULAR | Status: AC
  Filled 2017-02-01: qty 1

## 2017-02-01 MED ORDER — ONDANSETRON HCL 4 MG/2ML IJ SOLN
INTRAMUSCULAR | Status: DC | PRN
  Administered 2017-02-01: 4 mg via INTRAVENOUS

## 2017-02-01 MED ORDER — IOHEXOL 300 MG/ML  SOLN
INTRAMUSCULAR | Status: DC | PRN
  Administered 2017-02-01: 20 mL

## 2017-02-01 MED ORDER — EPHEDRINE SULFATE-NACL 50-0.9 MG/10ML-% IV SOSY
PREFILLED_SYRINGE | INTRAVENOUS | Status: DC | PRN
  Administered 2017-02-01: 15 mg via INTRAVENOUS
  Administered 2017-02-01: 10 mg via INTRAVENOUS

## 2017-02-01 MED ORDER — OXYCODONE HCL 5 MG PO TABS: 5.0000 mg | ORAL_TABLET | ORAL | 0 refills | Status: DC | PRN

## 2017-02-01 SURGICAL SUPPLY — 28 items
BAG DRAIN URO-CYSTO SKYTR STRL (DRAIN) ×2 IMPLANT
BAG DRN UROCATH (DRAIN) ×1
BASKET LASER NITINOL 1.9FR (BASKET) ×2 IMPLANT
BSKT STON RTRVL 120 1.9FR (BASKET) ×1
CATH INTERMIT  6FR 70CM (CATHETERS) ×2 IMPLANT
CLOTH BEACON ORANGE TIMEOUT ST (SAFETY) ×2 IMPLANT
FIBER LASER TRAC TIP (UROLOGICAL SUPPLIES) ×2 IMPLANT
GLOVE BIO SURGEON STRL SZ7.5 (GLOVE) IMPLANT
GLOVE BIOGEL PI IND STRL 6.5 (GLOVE) ×1 IMPLANT
GLOVE BIOGEL PI INDICATOR 6.5 (GLOVE) ×1
GOWN STRL REUS W/TWL LRG LVL3 (GOWN DISPOSABLE) ×2 IMPLANT
GOWN STRL REUS W/TWL XL LVL3 (GOWN DISPOSABLE) ×2 IMPLANT
GUIDEWIRE ANG ZIPWIRE 038X150 (WIRE) ×2 IMPLANT
GUIDEWIRE STR DUAL SENSOR (WIRE) ×2 IMPLANT
INFUSOR MANOMETER BAG 3000ML (MISCELLANEOUS) ×2 IMPLANT
IV NS 1000ML (IV SOLUTION)
IV NS 1000ML BAXH (IV SOLUTION) IMPLANT
IV NS IRRIG 3000ML ARTHROMATIC (IV SOLUTION) ×4 IMPLANT
KIT RM TURNOVER CYSTO AR (KITS) ×2 IMPLANT
MANIFOLD NEPTUNE II (INSTRUMENTS) ×2 IMPLANT
NS IRRIG 500ML POUR BTL (IV SOLUTION) IMPLANT
PACK CYSTO (CUSTOM PROCEDURE TRAY) ×2 IMPLANT
SHEATH ACCESS URETERAL 24CM (SHEATH) ×2 IMPLANT
STENT URET 6FRX26 CONTOUR (STENTS) ×2 IMPLANT
SYRINGE 10CC LL (SYRINGE) ×2 IMPLANT
TUBE CONNECTING 12X1/4 (SUCTIONS) ×2 IMPLANT
TUBE FEEDING 8FR 16IN STR KANG (MISCELLANEOUS) ×2 IMPLANT
WATER STERILE IRR 500ML POUR (IV SOLUTION) ×2 IMPLANT

## 2017-02-01 NOTE — Op Note (Signed)
NAME:  Lynn Estrada, Lynn Estrada                  ACCOUNT NO.:  MEDICAL RECORD NO.:  [id number]  LOCATION:                                 FACILITY:  PHYSICIAN:  Alexis Frock, MD     DATE OF BIRTH:  10-Nov-1960  DATE OF PROCEDURE: 02/01/2017                               OPERATIVE REPORT   PREOPERATIVE DIAGNOSIS:  Residual left renal stone.  PROCEDURE: 1. Cystoscopy with left retrograde pyelogram and interpretation. 2. Left second-stage ureteroscopy with laser lithotripsy and basket     extraction. 3. Exchange of left ureteral stent, 6 x 26 Contour with tether.  ESTIMATED BLOOD LOSS:  Nil.  COMPLICATION:  None.  SPECIMEN:  Residual left renal and ureteral stone fragments for compositional analysis.  FINDINGS: 1. Dominant left distal ureteral stone.  This likely represented     antegrade progression of prior renal stone. 2. Multifocal small volume residual left renal and ureteral stones.     Total volume approximately 1.2 cm. 3. Complete resolution of all stone fragments larger than 1/3rd mm     following laser lithotripsy and basket extraction. 4. Successful replacement of left ureteral stent, proximal end in     renal pelvis and distal end in urinary bladder.  INDICATION:  Lynn Estrada is a pleasant 56 year old lady with recent history of urosepsis from an obstructing very large left renal and ureteral stone.  She underwent stenting at that time for temporizing. She has since cleared her infectious parameters.  Given her large stone volume, options were discussed including staged ureteroscopy versus percutaneous surgery and she wished to proceed with staged ureteroscopy. She underwent her first-stage procedure approximately 2 weeks ago and tolerated this well.  She now presents for second stage.  Informed consent was obtained and placed in the medical record.  PROCEDURE IN DETAIL:  The patient being, Lynn Estrada, verified. Procedure being, left second-stage ureteroscopic  stone manipulation, was confirmed.  Procedure was carried out.  Time-out was performed. Intravenous antibiotics were administered.  General LMA anesthesia introduced.  The patient was placed into a low lithotomy position. Sterile field was created by prepping and draping the patient's vagina, introitus, and proximal thighs using iodine.  Cystourethroscopy was performed using a 22-French rigid cystoscope with offset lens. Inspection of urinary bladder revealed no diverticula, calcifications, papillary lesions.  Distal end of the left ureteral stent was seen in situ.  This was grasped, brought to the level of the urethral meatus, through which a 0.038 ZIP wire was advanced.  The stent was exchanged for an open-ended catheter and left retrograde pyelogram was obtained.  Left retrograde pyelogram demonstrated a single left ureter with single- system left kidney.  There was mild hydroureteronephrosis noted.  The ZIP wire was once again advanced, set aside as a safety wire.  An 8- [place] feeding tube placed in the urinary bladder for pressure release. A semi-rigid ureteroscopy was performed of the distal left ureter alongside a separate Sensor working wire.  There was a dominant left distal ureteral stone noted, this was ovoid and at least 8 mm.  This was felt to likely represent antegrade progression of prior known residual left renal stone.  This did appear  amenable to basketing.  It was grasped on its long axis and was removed, set aside.  Semi-rigid ureteroscopy, the remainder length of left ureter revealed no additional stone fragments as the goal today was to verify left-sided stone free. The semi-rigid scope was exchanged for a 12/14, 24-cm ureteral access sheath at the level of proximal ureter.  Using continuous fluoroscopic guidance, inspection of the left kidney was performed.  There was multifocal small volume stones mostly in the mid and lower poles.  About half of the volume was  amenable to basketing.  These were basketed with an Escape basket, removed and set aside.  The remaining stone volume was quite small at fragments less than a millimeter.  As such, a laser dusting technique was used.  The settings were 1.8 joules and 10 hertz, a 200 mm fiber.  Dusting these fragments into ones that are 1/3rd mm or less in diameter.  Following these maneuvers, excellent hemostasis, complete resolution of all stone fragments larger than 1/3rd mm, the access sheath was removed under continuous vision.  No mucosal abnormalities were found.  Given the large volume stone, it was felt that brief interval stenting would be warranted.  As such, a new 6 x 26 Contour-type stent was placed using fluoroscopic guidance.  Good proximal and distal deployment were noted.  Tether was left in place and fashioned to the inner thigh and the procedure was terminated.  The patient tolerated procedure well.  No immediate periprocedural complications.  The patient was taken to the postanesthesia care unit in stable condition.          ______________________________ Alexis Frock, MD     TM/MEDQ  D:  02/01/2017  T:  02/01/2017  Job:  [id number]

## 2017-02-01 NOTE — Brief Op Note (Signed)
02/01/2017  9:17 AM  PATIENT:  Lynn Estrada  56 y.o. female  PRE-OPERATIVE DIAGNOSIS:  LARGE LEFT URETERAL STONE AND RENAL STONE  POST-OPERATIVE DIAGNOSIS:  * No post-op diagnosis entered *  PROCEDURE:  Procedure(s): 2ND STAGE CYSTOSCOPY WITH RETROGRADE PYELOGRAM, URETEROSCOPY AND STENT REPLACEMENT (Left) HOLMIUM LASER APPLICATION (Left)  SURGEON:  Surgeon(s) and Role:    Alexis Frock, MD - Primary  PHYSICIAN ASSISTANT:   ASSISTANTS: none   ANESTHESIA:   general  EBL:  Total I/O In: 500 [I.V.:500] Out: 5 [Blood:5]  BLOOD ADMINISTERED:none  DRAINS: none   LOCAL MEDICATIONS USED:  NONE  SPECIMEN:  Source of Specimen:  given to patient  DISPOSITION OF SPECIMEN:  Source of Specimen:  residual left renal / ureteral stone fragemnts  COUNTS:  YES  TOURNIQUET:  * No tourniquets in log *  DICTATION: .Other Dictation: Dictation Number [id number]  PLAN OF CARE: Discharge to home after PACU  PATIENT DISPOSITION:  PACU - hemodynamically stable.   Delay start of Pharmacological VTE agent (>24hrs) due to surgical blood loss or risk of bleeding: yes

## 2017-02-01 NOTE — Interval H&P Note (Signed)
History and Physical Interval Note:  02/01/2017 8:21 AM  Lynn Estrada  has presented today for surgery, with the diagnosis of LARGE LEFT URETERAL STONE AND RENAL STONE  The various methods of treatment have been discussed with the patient and family. After consideration of risks, benefits and other options for treatment, the patient has consented to  Procedure(s): 2ND STAGE CYSTOSCOPY WITH RETROGRADE PYELOGRAM, URETEROSCOPY AND STENT REPLACEMENT (Left) HOLMIUM LASER APPLICATION (Left) as a surgical intervention .  The patient's history has been reviewed, patient examined, no change in status, stable for surgery.  I have reviewed the patient's chart and labs.  Questions were answered to the patient's satisfaction.     Zebediah Beezley

## 2017-02-01 NOTE — H&P (Signed)
Lynn Estrada is an 56 y.o. female.    Chief Complaint: Pre-OP LEFT 2nd stage Ureteroscopic stone manipulation  HPI:   1 - Left Ureteral Stone - 98mm left proximal ureteral stone + 38mm lower pole with hydro + stranding by ER Ct on eval abdonabdominal pain and fevers 12/31/16. No Rt sided stones. Stones 600HU. Cr 1.05. Left JJ stent placed 12/31/16 for urgent renal decompression. First stage ureteroscopy and stent exchange 01/10/17.   2 -  History of Urosepsis - Urosepsis early 12/2016 from Klebsiella in setting of obstructing stone. Treated with IV transition to PO ABX and renal decompression. She has been on Cipro prior to surgeyr today to reduce colonization based on most recent CX's.    PMH sig for Rt hemi nephrectomy, hernia surgery, back surgery (no deficits). No CV disease / blood thinners.  Today "[place]" is seen to proceed with LEFT 2nd stage ureteroscopy for residual left renal stones. No interval fevers.   Past Medical History:  Diagnosis Date  . Anemia   . Eczema   . GERD (gastroesophageal reflux disease)   . History of adenomatous polyp of colon    08-03-2016  tubular adenoma x2 and hyperplastic  . History of chronic gastritis 08/03/2016  . History of ectopic pregnancy 1988   s/p  left salpingectomy  . History of endometriosis   . History of kidney stones   . History of partial nephrectomy    right pelvis for very large stone  . History of pneumonia 07/02/2016   CAP  . History of sepsis    07-01-2016  sepsis w/ pyelonephritis, CAP /   12-31-2016  urosepsis w/ kidney stone obstruction  . History of uterine leiomyoma   . Hypertension   . Left ureteral stone   . Nephrolithiasis    left obstructive stone and right non-obstructive stone  per CT 12-31-2016  . Urgency of urination     Past Surgical History:  Procedure Laterality Date  . ABDOMINAL HYSTERECTOMY  01/20/2007   w/ Lysis Adhesions/  Left salpingoophorectomy/  Right Salpingectomy  . CHOLECYSTECTOMY  1994    and Right Partial Nephrectomy (pelvis for very large stone)  . CYSTOSCOPY W/ URETERAL STENT PLACEMENT Left 12/31/2016   Procedure: CYSTOSCOPY WITH RETROGRADE PYELOGRAM/ LEFT URETERAL STENT PLACEMENT;  Surgeon: Alexis Frock, MD;  Location: [facility];  Service: Urology;  Laterality: Left;  . CYSTOSCOPY WITH RETROGRADE PYELOGRAM, URETEROSCOPY AND STENT PLACEMENT Left 01/18/2017   Procedure: 1ST STAGE CYSTOSCOPY WITH RETROGRADE PYELOGRAM, URETEROSCOPY AND STENT REPLACEMENT;  Surgeon: Alexis Frock, MD;  Location: [facility];  Service: Urology;  Laterality: Left;  . ECTOPIC PREGNANCY SURGERY  1988   Left Salpingectomy  . HOLMIUM LASER APPLICATION Left 4/70/9628   Procedure: HOLMIUM LASER APPLICATION;  Surgeon: Alexis Frock, MD;  Location: [facility];  Service: Urology;  Laterality: Left;  . LUMBAR DISC SURGERY  01/1999   right L5 -- S1  . RE-EXPLORATION LUMBAR/  LAMINECTOMY AND MICRODISKECTOMY  10/14/2001   right L5 -- S1  . TUBAL LIGATION Bilateral 10/17/1999   PPTL  . UMBILICAL HERNIA REPAIR  age 39    Family History  Problem Relation Age of Onset  . Hypertension Sister   . Eczema Sister   . Lupus Sister   . Sarcoidosis Mother   . Prostate cancer Father   . Gout Maternal Grandmother   . Diabetes Maternal Grandmother        leg amputations  . Breast cancer Maternal Aunt   .  Stomach cancer Neg Hx   . Colon cancer Neg Hx    Social History:  reports that she quit smoking about 20 years ago. Her smoking use included Cigarettes. She has a 3.75 pack-year smoking history. She has never used smokeless tobacco. She reports that she drinks alcohol. She reports that she does not use drugs.  Allergies:  Allergies  Allergen Reactions  . Bactrim [Sulfamethoxazole-Trimethoprim] Rash  . Latex Rash    No prescriptions prior to admission.    No results found for this or any previous visit (from the past 48 hour(s)). No results found.  Review of Systems   Constitutional: Negative.  Negative for chills and fever.  HENT: Negative.   Eyes: Negative.   Respiratory: Negative.   Cardiovascular: Negative.   Gastrointestinal: Negative.  Negative for nausea and vomiting.  Genitourinary: Positive for urgency.  Musculoskeletal: Negative.   Skin: Negative.   Neurological: Negative.   Endo/Heme/Allergies: Negative.   Psychiatric/Behavioral: Negative.     There were no vitals taken for this visit. Physical Exam  HENT:  Head: Normocephalic.  Eyes: Pupils are equal, round, and reactive to light.  Neck: Normal range of motion.  Cardiovascular: Normal rate.   Respiratory: Effort normal.  GI: Soft.  Genitourinary:  Genitourinary Comments: NO CVAT at present  Musculoskeletal: Normal range of motion.  Neurological: She is alert.  Skin: Skin is warm.  Psychiatric: She has a normal mood and affect.     Assessment/Plan  Proceed as planned with LEFT 2nd stage ureteroscopy today with goal of left side stone free. Risks, benefits, alternatives, expected peri-op course discussed previously and reiterated today.   Alexis Frock, MD 02/01/2017, 6:24 AM

## 2017-02-01 NOTE — Anesthesia Preprocedure Evaluation (Signed)
Anesthesia Evaluation  Patient identified by MRN, date of birth, ID band Patient awake    Reviewed: Allergy & Precautions, H&P , NPO status , Patient's Chart, lab work & pertinent test results  Airway Mallampati: II   Neck ROM: full    Dental   Pulmonary former smoker,    breath sounds clear to auscultation       Cardiovascular hypertension,  Rhythm:regular Rate:Normal     Neuro/Psych    GI/Hepatic GERD  ,  Endo/Other    Renal/GU Renal diseasestones     Musculoskeletal   Abdominal   Peds  Hematology   Anesthesia Other Findings   Reproductive/Obstetrics                             Anesthesia Physical Anesthesia Plan  ASA: II  Anesthesia Plan: General   Post-op Pain Management:    Induction: Intravenous  PONV Risk Score and Plan: 4 or greater and Ondansetron, Dexamethasone, Midazolam, Scopolamine patch - Pre-op, Propofol infusion and Treatment may vary due to age or medical condition  Airway Management Planned: LMA  Additional Equipment:   Intra-op Plan:   Post-operative Plan:   Informed Consent: I have reviewed the patients History and Physical, chart, labs and discussed the procedure including the risks, benefits and alternatives for the proposed anesthesia with the patient or authorized representative who has indicated his/her understanding and acceptance.     Plan Discussed with: CRNA, Anesthesiologist and Surgeon  Anesthesia Plan Comments:         Anesthesia Quick Evaluation

## 2017-02-01 NOTE — Anesthesia Procedure Notes (Signed)
Procedure Name: LMA Insertion Date/Time: 02/01/2017 8:48 AM Performed by: Marcie Bal, ADAM Pre-anesthesia Checklist: Patient identified, Emergency Drugs available, Suction available and Patient being monitored Patient Re-evaluated:Patient Re-evaluated prior to induction Oxygen Delivery Method: Circle system utilized Preoxygenation: Pre-oxygenation with 100% oxygen Induction Type: IV induction Ventilation: Mask ventilation without difficulty LMA: LMA inserted LMA Size: 4.0 Number of attempts: 1 Airway Equipment and Method: Bite block Placement Confirmation: positive ETCO2 Tube secured with: Tape Dental Injury: Teeth and Oropharynx as per pre-operative assessment

## 2017-02-01 NOTE — Transfer of Care (Signed)
  Last Vitals:  Vitals:   02/01/17 0658 02/01/17 0925  BP: (!) 143/80 (P) 133/86  Pulse: 72 93  Resp: 16 (P) 14  Temp: 36.9 C 36.8 C    Last Pain:  Vitals:   02/01/17 0715  TempSrc:   PainSc: 4       Patients Stated Pain Goal: 7 (02/01/17 0715)  Immediate Anesthesia Transfer of Care Note  Patient: Lynn Estrada  Procedure(s) Performed: Procedure(s) (LRB): 2ND STAGE CYSTOSCOPY WITH RETROGRADE PYELOGRAM, URETEROSCOPY AND STENT REPLACEMENT (Left) HOLMIUM LASER APPLICATION (Left)  Patient Location: PACU  Anesthesia Type: General  Level of Consciousness: awake, alert  and oriented  Airway & Oxygen Therapy: Patient Spontanous Breathing and Patient connected to nasal cannula oxygen  Post-op Assessment: Report given to PACU RN and Post -op Vital signs reviewed and stable  Post vital signs: Reviewed and stable  Complications: No apparent anesthesia complications

## 2017-02-01 NOTE — Anesthesia Postprocedure Evaluation (Signed)
Anesthesia Post Note  Patient: Lynn Estrada  Procedure(s) Performed: Procedure(s) (LRB): 2ND STAGE CYSTOSCOPY WITH RETROGRADE PYELOGRAM, URETEROSCOPY AND STENT REPLACEMENT (Left) HOLMIUM LASER APPLICATION (Left)     Patient location during evaluation: PACU Anesthesia Type: General Level of consciousness: awake and alert and patient cooperative Pain management: pain level controlled Vital Signs Assessment: post-procedure vital signs reviewed and stable Respiratory status: spontaneous breathing and respiratory function stable Cardiovascular status: stable Anesthetic complications: no    Last Vitals:  Vitals:   02/01/17 1030 02/01/17 1051  BP: 137/86 (!) 154/96  Pulse: 80 85  Resp: 15 18  Temp:  36.5 C    Last Pain:  Vitals:   02/01/17 1051  TempSrc: Oral  PainSc:                  [place]

## 2017-02-01 NOTE — Discharge Instructions (Signed)
1 - You may have urinary urgency (bladder spasms) and bloody urine on / off with stent in place. This is normal.  2 -  Remove tethered stent on Monday morning by pulling on string, then blue-white plastic tubing and discarding. Office is open Monday if any acute issues arise.   3 - Call MD or go to ER for fever >102, severe pain / nausea / vomiting not relieved by medications, or acute change in medical status  [facility] [phone] Post Ureteroscopy With or Without Stent Instructions  Definitions:  Ureter: The duct that transports urine from the kidney to the bladder. Stent:   A plastic hollow tube that is placed into the ureter, from the kidney to the                 bladder to prevent the ureter from swelling shut.  GENERAL INSTRUCTIONS:  Despite the fact that no skin incisions were used, the area around the ureter and bladder is raw and irritated. The stent is a foreign body which will further irritate the bladder wall. This irritation is manifested by increased frequency of urination, both day and night, and by an increase in the urge to urinate. In some, the urge to urinate is present almost always. Sometimes the urge is strong enough that you may not be able to stop yourself from urinating. The only real cure is to remove the stent and then give time for the bladder wall to heal which can't be done until the danger of the ureter swelling shut has passed, which varies.  You may see some blood in your urine while the stent is in place and a few days afterwards. Do not be alarmed, even if the urine was clear for a while. Get off your feet and drink lots of fluids until clearing occurs. If you start to pass clots or don't improve, call us.  DIET: You may return to your normal diet immediately. Because of the raw surface of your bladder, alcohol, spicy foods, acid type foods and drinks with caffeine may cause irritation or frequency and should be used in moderation. To  keep your urine flowing freely and to avoid constipation, drink plenty of fluids during the day ( 8-10 glasses ). Tip: Avoid cranberry juice because it is very acidic.  ACTIVITY: Your physical activity doesn't need to be restricted. However, if you are very active, you may see some blood in your urine. We suggest that you reduce your activity under these circumstances until the bleeding has stopped.  BOWELS: It is important to keep your bowels regular during the postoperative period. Straining with bowel movements can cause bleeding. A bowel movement every other day is reasonable. Use a mild laxative if needed, such as Milk of Magnesia 2-3 tablespoons, or 2 Dulcolax tablets. Call if you continue to have problems. If you have been taking narcotics for pain, before, during or after your surgery, you may be constipated. Take a laxative if necessary.   MEDICATION: You should resume your pre-surgery medications unless told not to. In addition you will often be given an antibiotic to prevent infection. These should be taken as prescribed until the bottles are finished unless you are having an unusual reaction to one of the drugs.  PROBLEMS YOU SHOULD REPORT TO [place]:  Fevers over 100.5 Fahrenheit.  Heavy bleeding, or clots ( See above notes about blood in urine ).  Inability to urinate.  Drug reactions ( hives, rash, nausea, vomiting, diarrhea ).  Severe burning or pain with urination that is not improving.  FOLLOW-UP: You will need a follow-up appointment to monitor your progress. Call for this appointment at the number listed above. Usually the first appointment will be about three to fourteen days after your surgery.   Post Anesthesia Home Care Instructions  Activity: Get plenty of rest for the remainder of the day. A responsible individual must stay with you for 24 hours following the procedure.  For the next 24 hours, DO NOT: -Drive a car -Paediatric nurse -Drink alcoholic  beverages -Take any medication unless instructed by your physician -Make any legal decisions or sign important papers.  Meals: Start with liquid foods such as gelatin or soup. Progress to regular foods as tolerated. Avoid greasy, spicy, heavy foods. If nausea and/or vomiting occur, drink only clear liquids until the nausea and/or vomiting subsides. Call your physician if vomiting continues.  Special Instructions/Symptoms: Your throat may feel dry or sore from the anesthesia or the breathing tube placed in your throat during surgery. If this causes discomfort, gargle with warm salt water. The discomfort should disappear within 24 hours.  If you had a scopolamine patch placed behind your ear for the management of post- operative nausea and/or vomiting:  1. The medication in the patch is effective for 72 hours, after which it should be removed.  Wrap patch in a tissue and discard in the trash. Wash hands thoroughly with soap and water. 2. You may remove the patch earlier than 72 hours if you experience unpleasant side effects which may include dry mouth, dizziness or visual disturbances. 3. Avoid touching the patch. Wash your hands with soap and water after contact with the patch.

## 2017-02-04 ENCOUNTER — Encounter (HOSPITAL_BASED_OUTPATIENT_CLINIC_OR_DEPARTMENT_OTHER): Payer: Self-pay | Admitting: Urology

## 2017-02-04 DIAGNOSIS — N200 Calculus of kidney: Secondary | ICD-10-CM | POA: Diagnosis not present

## 2017-04-25 ENCOUNTER — Ambulatory Visit (HOSPITAL_COMMUNITY)
Admission: EM | Admit: 2017-04-25 | Discharge: 2017-04-25 | Disposition: A | Discharge status: Home / Self Care | Payer: BLUE CROSS/BLUE SHIELD | Attending: Family Medicine | Admitting: Family Medicine

## 2017-04-25 ENCOUNTER — Encounter (HOSPITAL_COMMUNITY): Payer: Self-pay | Admitting: Family Medicine

## 2017-04-25 DIAGNOSIS — R040 Epistaxis: Secondary | ICD-10-CM | POA: Diagnosis not present

## 2017-04-25 NOTE — Discharge Instructions (Signed)
Please use nasal saline 3-4 times a day, especially to right nostril. Please have bp rechecked with your primary care doctor next week, it looks fine here today.

## 2017-04-25 NOTE — ED Provider Notes (Signed)
[name]    CSN: [id number] Arrival date & time: 04/25/17  1001     History   Chief Complaint Chief Complaint  Patient presents with  . Hypertension  . Epistaxis    HPI Lynn Estrada is a 56 y.o. female.   [name] presents with complaints of cold symptoms with small cough and sore throat which started a few days ago. She also states that the past two nights she has had nose bleeds, one today while at work. During bleed at work pta she had her BP checked at it was 145/105. She takes daily norvasc as well as 81 mg asa. Denies headache, fevers, dizziness nausea vomiting diarrhea chest pain or shortness of breath. No known ill contacts. Throat lozenges have helped her throat pain. Pressure to her nose has stopped bleeding. States she has had history of nosebleeds, has never required cauterization/procedure for this. She is not on any additional blood thinners. Without injury to nose. She endorses that she has began using her heater in the home over the past few days.      ROS per HPI.   Past Medical History:  Diagnosis Date  . Anemia   . Eczema   . GERD (gastroesophageal reflux disease)   . History of adenomatous polyp of colon    08-03-2016  tubular adenoma x2 and hyperplastic  . History of chronic gastritis 08/03/2016  . History of ectopic pregnancy 1988   s/p  left salpingectomy  . History of endometriosis   . History of kidney stones   . History of partial nephrectomy    right pelvis for very large stone  . History of pneumonia 07/02/2016   CAP  . History of sepsis    07-01-2016  sepsis w/ pyelonephritis, CAP /   12-31-2016  urosepsis w/ kidney stone obstruction  . History of uterine leiomyoma   . Hypertension   . Left ureteral stone   . Nephrolithiasis    left obstructive stone and right non-obstructive stone  per CT 12-31-2016  . Urgency of urination     Patient Active Problem List   Diagnosis Date Noted  . Ureteral stone with hydronephrosis  12/31/2016  . Anemia 07/10/2016  . Essential hypertension 07/26/2015  . Eczema 07/26/2015  . BMI 28.0-28.9,adult 07/26/2015    Past Surgical History:  Procedure Laterality Date  . ABDOMINAL HYSTERECTOMY  01/20/2007   w/ Lysis Adhesions/  Left salpingoophorectomy/  Right Salpingectomy  . CHOLECYSTECTOMY  1994   and Right Partial Nephrectomy (pelvis for very large stone)  . CYSTOSCOPY W/ URETERAL STENT PLACEMENT Left 12/31/2016   Procedure: CYSTOSCOPY WITH RETROGRADE PYELOGRAM/ LEFT URETERAL STENT PLACEMENT;  Surgeon: Alexis Frock, MD;  Location: [facility];  Service: Urology;  Laterality: Left;  . CYSTOSCOPY WITH RETROGRADE PYELOGRAM, URETEROSCOPY AND STENT PLACEMENT Left 01/18/2017   Procedure: 1ST STAGE CYSTOSCOPY WITH RETROGRADE PYELOGRAM, URETEROSCOPY AND STENT REPLACEMENT;  Surgeon: Alexis Frock, MD;  Location: [facility];  Service: Urology;  Laterality: Left;  . CYSTOSCOPY WITH RETROGRADE PYELOGRAM, URETEROSCOPY AND STENT PLACEMENT Left 02/01/2017   Procedure: 2ND STAGE CYSTOSCOPY WITH RETROGRADE PYELOGRAM, URETEROSCOPY AND STENT REPLACEMENT;  Surgeon: Alexis Frock, MD;  Location: [facility];  Service: Urology;  Laterality: Left;  . ECTOPIC PREGNANCY SURGERY  1988   Left Salpingectomy  . HOLMIUM LASER APPLICATION Left 0/93/8182   Procedure: HOLMIUM LASER APPLICATION;  Surgeon: Alexis Frock, MD;  Location: [facility];  Service: Urology;  Laterality: Left;  . HOLMIUM LASER APPLICATION  Left 02/01/2017   Procedure: HOLMIUM LASER APPLICATION;  Surgeon: Alexis Frock, MD;  Location: [facility];  Service: Urology;  Laterality: Left;  . LUMBAR DISC SURGERY  01/1999   right L5 -- S1  . RE-EXPLORATION LUMBAR/  LAMINECTOMY AND MICRODISKECTOMY  10/14/2001   right L5 -- S1  . TUBAL LIGATION Bilateral 10/17/1999   PPTL  . UMBILICAL HERNIA REPAIR  age 54    OB History    No data available       Home Medications    Prior  to Admission medications   Medication Sig Start Date End Date Taking? Authorizing Provider  acetaminophen (TYLENOL) 500 MG tablet Take 1,000 mg by mouth every 6 (six) hours as needed for mild pain or moderate pain.    [provider]  amLODipine (NORVASC) 10 MG tablet Take 10 mg by mouth every morning.  11/12/16   [provider]  aspirin 81 MG chewable tablet Chew 81 mg by mouth daily.    [provider]  ciprofloxacin (CIPRO) 500 MG tablet Take 1 tablet (500 mg total) by mouth 2 (two) times daily. To prevent infection with tethered stent in place. 02/01/17   Alexis Frock, MD  hydrochlorothiazide (HYDRODIURIL) 25 MG tablet Take 1 tablet (25 mg total) by mouth daily. Patient taking differently: Take 25 mg by mouth daily.  08/01/16   [name], PA-C  hydrOXYzine (ATARAX/VISTARIL) 50 MG tablet Take 1 tablet (50 mg total) by mouth at bedtime as needed. 07/26/16   Horald Pollen, MD  ketorolac (TORADOL) 10 MG tablet Take 1 tablet (10 mg total) by mouth every 6 (six) hours as needed. For mild pain / stent discomfort post-operatvely. (Pt has tolerated prior) 02/01/17   Alexis Frock, MD  Multiple Vitamins-Minerals (MULTIVITAMIN ADULT PO) Take 1 tablet by mouth daily.    [provider]  Omeprazole 20 MG TBEC Take 1 tablet (20 mg total) by mouth daily. 30-60 mins before breakfast 07/20/16   Levin Erp, PA  oxybutynin (DITROPAN) 5 MG tablet Take 1 tablet (5 mg total) by mouth every 8 (eight) hours as needed for bladder spasms. 01/02/17   Nickie Retort, MD  oxyCODONE (OXY IR/ROXICODONE) 5 MG immediate release tablet Take 1 tablet (5 mg total) by mouth every 4 (four) hours as needed for breakthrough pain. Post-operatively 02/01/17   Alexis Frock, MD  senna-docusate (SENOKOT-S) 8.6-50 MG tablet Take 2 tablets by mouth at bedtime. 01/02/17   Nickie Retort, MD    Family History Family History  Problem Relation Age of Onset  . Hypertension  Sister   . Eczema Sister   . Lupus Sister   . Sarcoidosis Mother   . Prostate cancer Father   . Gout Maternal Grandmother   . Diabetes Maternal Grandmother        leg amputations  . Breast cancer Maternal Aunt   . Stomach cancer Neg Hx   . Colon cancer Neg Hx     Social History Social History  Substance Use Topics  . Smoking status: Former Smoker    Packs/day: 0.75    Years: 5.00    Types: Cigarettes    Quit date: 01/10/1997  . Smokeless tobacco: Never Used  . Alcohol use 0.0 oz/week     Comment: social use; once a month     Allergies   Bactrim [sulfamethoxazole-trimethoprim] and Latex   Review of Systems Review of Systems   Physical Exam Triage Vital Signs ED Triage Vitals [04/25/17 1014]  Enc Vitals Group     BP 136/88     Pulse Rate 77     Resp 18     Temp 98.3 F (36.8 C)     Temp src      SpO2 96 %     Weight      Height      Head Circumference      Peak Flow      Pain Score      Pain Loc      Pain Edu?      Excl. in [place]?    No data found.   Updated Vital Signs BP 136/88   Pulse 77   Temp 98.3 F (36.8 C)   Resp 18   SpO2 96%   Visual Acuity Right Eye Distance:   Left Eye Distance:   Bilateral Distance:    Right Eye Near:   Left Eye Near:    Bilateral Near:     Physical Exam  Constitutional: She is oriented to person, place, and time. She appears well-developed and well-nourished. No distress.  HENT:  Head: Normocephalic and atraumatic.  Right Ear: Tympanic membrane, external ear and ear canal normal.  Left Ear: Tympanic membrane, external ear and ear canal normal.  Nose: No mucosal edema or sinus tenderness. No epistaxis.  Mouth/Throat: Uvula is midline, oropharynx is clear and moist and mucous membranes are normal.  Right anterior nares with split of tissue, concerning for dryness causing epistaxis. Without any active bleeding at this time  Eyes: Pupils are equal, round, and reactive to light.  Neck: Normal range of motion.  Neck supple.  Cardiovascular: Normal rate and regular rhythm.   Pulmonary/Chest: Effort normal and breath sounds normal. No respiratory distress.  Neurological: She is alert and oriented to person, place, and time.  Skin: Skin is warm and dry.  Vitals reviewed.    UC Treatments / Results  Labs (all labs ordered are listed, but only abnormal results are displayed) Labs Reviewed - No data to display  EKG  EKG Interpretation None       Radiology No results found.  Procedures Procedures (including critical care time)  Medications Ordered in UC Medications - No data to display   Initial Impression / Assessment and Plan / UC Course  I have reviewed the triage vital signs and the nursing notes.  Pertinent labs & imaging results that were available during my care of the patient were reviewed by me and considered in my medical decision making (see chart for details).     BP 136/88 here today, stable. Nose exam concerning for dryness as source of bleeding, recommended use of nasal saline 3-4 times a day to moisturize. Avoid picking of nose or excessive blowing of it. Discussed epistaxis care if it returns. Recommended follow up with PCP in 1 week for BP recheck as it was elevated at work today. Return to be seen if bleeding cannot be controlled, develop headache or dizziness. Patient verbalized understanding and agreeable to plan.    Final Clinical Impressions(s) / UC Diagnoses   Final diagnoses:  Anterior epistaxis    New Prescriptions New Prescriptions   No medications on file     Controlled Substance Prescriptions [place] Controlled Substance Registry consulted? Not Applicable   Zigmund Gottron, NP 04/25/17 1041

## 2017-04-25 NOTE — ED Triage Notes (Signed)
Pt here for hypertension and multiple nosebleeds since last night.

## 2017-08-07 ENCOUNTER — Encounter: Payer: Self-pay | Admitting: Physician Assistant

## 2017-08-07 ENCOUNTER — Ambulatory Visit (INDEPENDENT_AMBULATORY_CARE_PROVIDER_SITE_OTHER): Payer: BLUE CROSS/BLUE SHIELD | Admitting: Physician Assistant

## 2017-08-07 ENCOUNTER — Other Ambulatory Visit: Payer: Self-pay

## 2017-08-07 VITALS — BP 108/68 | HR 103 | Temp 98.8°F | Resp 18 | Ht 73.0 in | Wt 222.0 lb

## 2017-08-07 DIAGNOSIS — I10 Essential (primary) hypertension: Secondary | ICD-10-CM

## 2017-08-07 DIAGNOSIS — Z6829 Body mass index (BMI) 29.0-29.9, adult: Secondary | ICD-10-CM

## 2017-08-07 DIAGNOSIS — D649 Anemia, unspecified: Secondary | ICD-10-CM | POA: Diagnosis not present

## 2017-08-07 DIAGNOSIS — L918 Other hypertrophic disorders of the skin: Secondary | ICD-10-CM

## 2017-08-07 NOTE — Progress Notes (Signed)
Patient ID: Lynn Estrada, female    DOB: 1960/07/03, 57 y.o.   MRN: [id number]  PCP: [name], PA-C  Chief Complaint  Patient presents with  . Nevus    one under breast and one on upper thigh under panty line     Subjective:   Presents for evaluation of multiple skin tags on the trunk that rub on her clothing (bra and panties) causing pain.  There are 2 on the LEFT upper thigh that get irritated by the elastic in the leg of her underwear. Another on the LEFT lateral chest wall and another on the RIGHT back that rub on her bra band.  She is ready to have them removed.   Review of Systems     Patient Active Problem List   Diagnosis Date Noted  . Ureteral stone with hydronephrosis 12/31/2016  . Anemia 07/10/2016  . Essential hypertension 07/26/2015  . Eczema 07/26/2015  . BMI 28.0-28.9,adult 07/26/2015    Allergies  Allergen Reactions  . Bactrim [Sulfamethoxazole-Trimethoprim] Rash  . Latex Rash    Prior to Admission medications   Medication Sig Start Date End Date Taking? Authorizing Provider  acetaminophen (TYLENOL) 500 MG tablet Take 1,000 mg by mouth every 6 (six) hours as needed for mild pain or moderate pain.   Yes [provider]  amLODipine (NORVASC) 10 MG tablet Take 10 mg by mouth every morning.  11/12/16  Yes [provider]  aspirin 81 MG chewable tablet Chew 81 mg by mouth daily.   Yes [provider]  ciprofloxacin (CIPRO) 500 MG tablet Take 1 tablet (500 mg total) by mouth 2 (two) times daily. To prevent infection with tethered stent in place. 02/01/17  Yes Alexis Frock, MD  hydrochlorothiazide (HYDRODIURIL) 25 MG tablet Take 1 tablet (25 mg total) by mouth daily. Patient taking differently: Take 25 mg by mouth daily.  08/01/16  Yes Maire Govan, PA-C  hydrOXYzine (ATARAX/VISTARIL) 50 MG tablet Take 1 tablet (50 mg total) by mouth at bedtime as needed. 07/26/16  Yes Sagardia, Ines Bloomer, MD  ketorolac (TORADOL)  10 MG tablet Take 1 tablet (10 mg total) by mouth every 6 (six) hours as needed. For mild pain / stent discomfort post-operatvely. (Pt has tolerated prior) 02/01/17  Yes Alexis Frock, MD  Multiple Vitamins-Minerals (MULTIVITAMIN ADULT PO) Take 1 tablet by mouth daily.   Yes [provider]  Omeprazole 20 MG TBEC Take 1 tablet (20 mg total) by mouth daily. 30-60 mins before breakfast 07/20/16  Yes Lemmon, Lavone Nian, PA  oxybutynin (DITROPAN) 5 MG tablet Take 1 tablet (5 mg total) by mouth every 8 (eight) hours as needed for bladder spasms. 01/02/17  Yes Nickie Retort, MD  oxyCODONE (OXY IR/ROXICODONE) 5 MG immediate release tablet Take 1 tablet (5 mg total) by mouth every 4 (four) hours as needed for breakthrough pain. Post-operatively 02/01/17  Yes Alexis Frock, MD  senna-docusate (SENOKOT-S) 8.6-50 MG tablet Take 2 tablets by mouth at bedtime. 01/02/17  Yes Nickie Retort, MD     Past Medical, Surgical Family and Social History reviewed and updated.        Objective:  Physical Exam BP 108/68   Pulse (!) 103   Temp 98.8 F (37.1 C) (Oral)   Resp 18   Ht 6\' 1"  (1.854 m)   Wt 222 lb (100.7 kg)   SpO2 96%   BMI 29.29 kg/m   Skin tags as before.  Procedure: Each lesion cleansed with alcohol prep pad.  Local anesthesia with 2% lidocaine with epinephrine.  Each lesion removed with sharps.  Both sites cleansed and Band-Aid applied.        Assessment & Plan:  1. Inflamed skin tag Status post removal, 2 lesions.  Local wound care.  2. Essential hypertension Well-controlled.  3. Anemia, unspecified type Update labs at next visit.  4. BMI 29.0-29.9,adult Healthy lifestyle changes encouraged.    Return in about 4 weeks (around 09/04/2017) for re-evalaution of blood pressure, fasting labs and to address hot flashes, etc.   Fara Chute, PA-C [facility] [place]

## 2017-08-07 NOTE — Patient Instructions (Addendum)
WOUND CARE . Keep area clean and dry for 24 hours. Do not remove bandage, if applied. . After 24 hours, remove bandage and wash wound gently with mild soap and warm water. Reapply a new bandage after cleaning wound, if needed. . Continue daily cleansing with soap and water until healed. . You may apply ointments or creams to the wound If desired. . Notify the office if you experience any of the following signs of infection: Swelling, redness, pus drainage, streaking, fever >101.0 F . Notify the office if you experience excessive bleeding that does not stop after 15-20 minutes of constant, firm pressure.   We recommend that you schedule a mammogram for breast cancer screening. Typically, you do not need a referral to do this. Please contact a local imaging center to schedule your mammogram.  [facility] - [phone]  *ask for the Radiology Department [facility]) - [phone] or [phone]  [facility] - [phone] [place] [phone] [facility] - [phone]  *ask for the [facility] - [phone]  *ask for the Radiology Department [facility] - [phone]  *ask for the [facility] - [phone]    IF you received an x-ray today, you will receive an invoice from [facility]. Please contact [facility] at [phone] with questions or concerns regarding your invoice.   IF you received labwork today, you will receive an invoice from [facility]. Please contact [facility] at [phone] with questions or concerns regarding your invoice.   Our billing staff will not be able to assist you with questions regarding bills from these companies.  You will be contacted with the lab results as soon as they are available. The fastest way to get your results is to activate your My Chart account.  Instructions are located on the last page of this paperwork. If you have not heard from [place] regarding the results in 2 weeks, please contact this office.

## 2017-09-03 DIAGNOSIS — Z1231 Encounter for screening mammogram for malignant neoplasm of breast: Secondary | ICD-10-CM | POA: Diagnosis not present

## 2017-09-04 ENCOUNTER — Ambulatory Visit: Payer: BLUE CROSS/BLUE SHIELD | Admitting: Physician Assistant

## 2017-09-05 ENCOUNTER — Other Ambulatory Visit: Payer: Self-pay | Admitting: Emergency Medicine

## 2017-09-10 ENCOUNTER — Emergency Department (HOSPITAL_COMMUNITY)
Admission: EM | Admit: 2017-09-10 | Discharge: 2017-09-10 | Disposition: A | Discharge status: Home / Self Care | Payer: BLUE CROSS/BLUE SHIELD | Attending: Physician Assistant | Admitting: Physician Assistant

## 2017-09-10 ENCOUNTER — Emergency Department (HOSPITAL_COMMUNITY): Payer: BLUE CROSS/BLUE SHIELD

## 2017-09-10 ENCOUNTER — Encounter (HOSPITAL_COMMUNITY): Payer: Self-pay | Admitting: Emergency Medicine

## 2017-09-10 DIAGNOSIS — Z91040 Latex allergy status: Secondary | ICD-10-CM | POA: Diagnosis not present

## 2017-09-10 DIAGNOSIS — I10 Essential (primary) hypertension: Secondary | ICD-10-CM | POA: Insufficient documentation

## 2017-09-10 DIAGNOSIS — Z79899 Other long term (current) drug therapy: Secondary | ICD-10-CM | POA: Insufficient documentation

## 2017-09-10 DIAGNOSIS — M25511 Pain in right shoulder: Secondary | ICD-10-CM | POA: Diagnosis not present

## 2017-09-10 DIAGNOSIS — Z7982 Long term (current) use of aspirin: Secondary | ICD-10-CM | POA: Diagnosis not present

## 2017-09-10 DIAGNOSIS — Z87891 Personal history of nicotine dependence: Secondary | ICD-10-CM | POA: Diagnosis not present

## 2017-09-10 DIAGNOSIS — R079 Chest pain, unspecified: Secondary | ICD-10-CM | POA: Diagnosis not present

## 2017-09-10 DIAGNOSIS — R0789 Other chest pain: Secondary | ICD-10-CM | POA: Diagnosis not present

## 2017-09-10 DIAGNOSIS — R069 Unspecified abnormalities of breathing: Secondary | ICD-10-CM | POA: Diagnosis not present

## 2017-09-10 LAB — I-STAT TROPONIN, ED
Troponin i, poc: 0 ng/mL (ref 0.00–0.08)
Troponin i, poc: 0.01 ng/mL (ref 0.00–0.08)

## 2017-09-10 LAB — CBC WITH DIFFERENTIAL/PLATELET
BASOS ABS: 0 10*3/uL (ref 0.0–0.1)
BASOS PCT: 0 %
Eosinophils Absolute: 0.1 10*3/uL (ref 0.0–0.7)
Eosinophils Relative: 1 %
HEMATOCRIT: 36.6 % (ref 36.0–46.0)
HEMOGLOBIN: 11.8 g/dL — AB (ref 12.0–15.0)
Lymphocytes Relative: 22 %
Lymphs Abs: 1.6 10*3/uL (ref 0.7–4.0)
MCH: 27.8 pg (ref 26.0–34.0)
MCHC: 32.2 g/dL (ref 30.0–36.0)
MCV: 86.1 fL (ref 78.0–100.0)
MONO ABS: 0.5 10*3/uL (ref 0.1–1.0)
Monocytes Relative: 7 %
NEUTROS ABS: 5.3 10*3/uL (ref 1.7–7.7)
NEUTROS PCT: 70 %
Platelets: 296 10*3/uL (ref 150–400)
RBC: 4.25 MIL/uL (ref 3.87–5.11)
RDW: 14.3 % (ref 11.5–15.5)
WBC: 7.5 10*3/uL (ref 4.0–10.5)

## 2017-09-10 LAB — BASIC METABOLIC PANEL
ANION GAP: 10 (ref 5–15)
BUN: 12 mg/dL (ref 6–20)
CALCIUM: 9.7 mg/dL (ref 8.9–10.3)
CO2: 24 mmol/L (ref 22–32)
Chloride: 104 mmol/L (ref 101–111)
Creatinine, Ser: 0.81 mg/dL (ref 0.44–1.00)
GFR calc non Af Amer: >60 mL/min (ref >60)
Glucose, Bld: 95 mg/dL (ref 65–99)
Potassium: 3.5 mmol/L (ref 3.5–5.1)
Sodium: 138 mmol/L (ref 135–145)

## 2017-09-10 MED ORDER — AZITHROMYCIN 250 MG PO TABS: 250.0000 mg | ORAL_TABLET | Freq: Every day | ORAL | 0 refills | Status: DC

## 2017-09-10 MED ORDER — METHOCARBAMOL 500 MG PO TABS
500.0000 mg | ORAL_TABLET | Freq: Once | ORAL | Status: AC
  Administered 2017-09-10: 500 mg via ORAL
  Filled 2017-09-10: qty 1

## 2017-09-10 MED ORDER — KETOROLAC TROMETHAMINE 30 MG/ML IJ SOLN
30.0000 mg | Freq: Once | INTRAMUSCULAR | Status: AC
  Administered 2017-09-10: 30 mg via INTRAVENOUS
  Filled 2017-09-10: qty 1

## 2017-09-10 MED ORDER — HYDROCODONE-ACETAMINOPHEN 5-325 MG PO TABS
1.0000 | ORAL_TABLET | Freq: Once | ORAL | Status: AC
  Administered 2017-09-10: 1 via ORAL
  Filled 2017-09-10: qty 1

## 2017-09-10 NOTE — Discharge Instructions (Signed)
It was my pleasure taking care of you today!   Please take all of your antibiotics until finished!   Tylenol as needed for pain. Ice or heat to the affected area will help with pain relief as well.   Keep your scheduled appointment with your primary care doctor.   Return to ER for new or worsening symptoms, any additional concerns.

## 2017-09-10 NOTE — ED Triage Notes (Signed)
Pt here from work with c/o chest and right shoulder pain , pt received 6 mg morphine , 324mg  asa and 2 nitro , no n/v ,

## 2017-09-10 NOTE — ED Provider Notes (Signed)
[facility] EMERGENCY DEPARTMENT Provider Note   CSN: [id number] Arrival date & time: 09/10/17  1345     History   Chief Complaint Chief Complaint  Patient presents with  . Chest Pain  . Shoulder Pain    HPI Lynn Estrada is a 57 y.o. female.  The history is provided by the patient and medical records. No language interpreter was used.  Chest Pain   Pertinent negatives include no palpitations.  Shoulder Pain     Lynn Estrada is a 57 y.o. female  with a PMH of HTN who presents to the Emergency Department complaining of right shoulder pain which radiates to her central chest beginning around 11 AM this morning.  She was sitting down at work when pain began.  Denies exertional component.  Pain is worse with movement of the right shoulder as well as palpation.  She received 324 ASA while at work.  2 doses of nitro and 6 mg morphine given by EMS.  She is still having chest pain, but does feel as if it has improved somewhat.  She denies any associated shortness of breath, diaphoresis, nausea or vomiting.  No recent fever, chills, cough, congestion.  No lower extremity swelling, calf pain, recent travel/surgeries/immobilizations or history of coagulopathy.  Denies history of similar sxs.  She does report doing a lot of cleaning at home over the last couple of days.  She is right-hand dominant.  Past Medical History:  Diagnosis Date  . Anemia   . Eczema   . GERD (gastroesophageal reflux disease)   . History of adenomatous polyp of colon    08-03-2016  tubular adenoma x2 and hyperplastic  . History of chronic gastritis 08/03/2016  . History of ectopic pregnancy 1988   s/p  left salpingectomy  . History of endometriosis   . History of kidney stones   . History of partial nephrectomy    right pelvis for very large stone  . History of pneumonia 07/02/2016   CAP  . History of sepsis    07-01-2016  sepsis w/ pyelonephritis, CAP /   12-31-2016  urosepsis w/  kidney stone obstruction  . History of uterine leiomyoma   . Hypertension   . Left ureteral stone   . Nephrolithiasis    left obstructive stone and right non-obstructive stone  per CT 12-31-2016  . Urgency of urination     Patient Active Problem List   Diagnosis Date Noted  . Ureteral stone with hydronephrosis 12/31/2016  . Anemia 07/10/2016  . Essential hypertension 07/26/2015  . Eczema 07/26/2015  . BMI 29.0-29.9,adult 07/26/2015    Past Surgical History:  Procedure Laterality Date  . ABDOMINAL HYSTERECTOMY  01/20/2007   w/ Lysis Adhesions/  Left salpingoophorectomy/  Right Salpingectomy  . CHOLECYSTECTOMY  1994   and Right Partial Nephrectomy (pelvis for very large stone)  . CYSTOSCOPY W/ URETERAL STENT PLACEMENT Left 12/31/2016   Procedure: CYSTOSCOPY WITH RETROGRADE PYELOGRAM/ LEFT URETERAL STENT PLACEMENT;  Surgeon: Alexis Frock, MD;  Location: [facility];  Service: Urology;  Laterality: Left;  . CYSTOSCOPY WITH RETROGRADE PYELOGRAM, URETEROSCOPY AND STENT PLACEMENT Left 01/18/2017   Procedure: 1ST STAGE CYSTOSCOPY WITH RETROGRADE PYELOGRAM, URETEROSCOPY AND STENT REPLACEMENT;  Surgeon: Alexis Frock, MD;  Location: [facility];  Service: Urology;  Laterality: Left;  . CYSTOSCOPY WITH RETROGRADE PYELOGRAM, URETEROSCOPY AND STENT PLACEMENT Left 02/01/2017   Procedure: 2ND STAGE CYSTOSCOPY WITH RETROGRADE PYELOGRAM, URETEROSCOPY AND STENT REPLACEMENT;  Surgeon: Alexis Frock, MD;  Location: [facility];  Service: Urology;  Laterality: Left;  . ECTOPIC PREGNANCY SURGERY  1988   Left Salpingectomy  . HOLMIUM LASER APPLICATION Left 7/56/4332   Procedure: HOLMIUM LASER APPLICATION;  Surgeon: Alexis Frock, MD;  Location: [facility];  Service: Urology;  Laterality: Left;  . HOLMIUM LASER APPLICATION Left 03/07/1883   Procedure: HOLMIUM LASER APPLICATION;  Surgeon: Alexis Frock, MD;  Location: [facility];  Service:  Urology;  Laterality: Left;  . LUMBAR DISC SURGERY  01/1999   right L5 -- S1  . RE-EXPLORATION LUMBAR/  LAMINECTOMY AND MICRODISKECTOMY  10/14/2001   right L5 -- S1  . TUBAL LIGATION Bilateral 10/17/1999   PPTL  . UMBILICAL HERNIA REPAIR  age 57    OB History    No data available       Home Medications    Prior to Admission medications   Medication Sig Start Date End Date Taking? Authorizing Provider  acetaminophen (TYLENOL) 500 MG tablet Take 1,000 mg by mouth every 6 (six) hours as needed for mild pain or moderate pain.    [provider]  amLODipine (NORVASC) 10 MG tablet Take 10 mg by mouth every morning.  11/12/16   [provider]  amLODipine (NORVASC) 10 MG tablet TAKE 1 TABLET (10 MG TOTAL) BY MOUTH DAILY. 09/05/17   [name], PA-C  aspirin 81 MG chewable tablet Chew 81 mg by mouth daily.    [provider]  ciprofloxacin (CIPRO) 500 MG tablet Take 1 tablet (500 mg total) by mouth 2 (two) times daily. To prevent infection with tethered stent in place. 02/01/17   Alexis Frock, MD  hydrochlorothiazide (HYDRODIURIL) 25 MG tablet Take 1 tablet (25 mg total) by mouth daily. Patient taking differently: Take 25 mg by mouth daily.  08/01/16   [name], PA-C  hydrOXYzine (ATARAX/VISTARIL) 50 MG tablet Take 1 tablet (50 mg total) by mouth at bedtime as needed. 07/26/16   Horald Pollen, MD  ketorolac (TORADOL) 10 MG tablet Take 1 tablet (10 mg total) by mouth every 6 (six) hours as needed. For mild pain / stent discomfort post-operatvely. (Pt has tolerated prior) 02/01/17   Alexis Frock, MD  Multiple Vitamins-Minerals (MULTIVITAMIN ADULT PO) Take 1 tablet by mouth daily.    [provider]  Omeprazole 20 MG TBEC Take 1 tablet (20 mg total) by mouth daily. 30-60 mins before breakfast 07/20/16   Levin Erp, PA  oxybutynin (DITROPAN) 5 MG tablet Take 1 tablet (5 mg total) by mouth every 8 (eight) hours as needed for bladder  spasms. 01/02/17   Nickie Retort, MD  oxyCODONE (OXY IR/ROXICODONE) 5 MG immediate release tablet Take 1 tablet (5 mg total) by mouth every 4 (four) hours as needed for breakthrough pain. Post-operatively 02/01/17   Alexis Frock, MD  senna-docusate (SENOKOT-S) 8.6-50 MG tablet Take 2 tablets by mouth at bedtime. 01/02/17   Nickie Retort, MD    Family History Family History  Problem Relation Age of Onset  . Hypertension Sister   . Eczema Sister   . Lupus Sister   . Sarcoidosis Mother   . Prostate cancer Father   . Gout Maternal Grandmother   . Diabetes Maternal Grandmother        leg amputations  . Breast cancer Maternal Aunt   . Stomach cancer Neg Hx   . Colon cancer Neg Hx     Social History Social History   Tobacco Use  . Smoking status: Former  Smoker    Packs/day: 0.75    Years: 5.00    Pack years: 3.75    Types: Cigarettes    Last attempt to quit: 01/10/1997    Years since quitting: 20.6  . Smokeless tobacco: Never Used  Substance Use Topics  . Alcohol use: Yes    Alcohol/week: 0.0 oz    Comment: social use; once a month  . Drug use: No     Allergies   Bactrim [sulfamethoxazole-trimethoprim] and Latex   Review of Systems Review of Systems  Cardiovascular: Positive for chest pain. Negative for palpitations and leg swelling.  Musculoskeletal: Positive for arthralgias (Right shoulder pain).  All other systems reviewed and are negative.    Physical Exam Updated Vital Signs BP 130/89 (BP Location: Right Arm)   Pulse 72   Temp 98 F (36.7 C) (Oral)   Resp 15   Ht 6\' 1"  (1.854 m)   Wt 100.7 kg (222 lb)   SpO2 98%   BMI 29.29 kg/m   Physical Exam  Constitutional: She is oriented to person, place, and time. She appears well-developed and well-nourished. No distress.  HENT:  Head: Normocephalic and atraumatic.  Neck: No JVD present.  Cardiovascular: Normal rate, regular rhythm and normal heart sounds.  No murmur heard. Pulmonary/Chest: Effort  normal. No respiratory distress. She has no wheezes. She exhibits tenderness.  Faint crackles to right lung base.  Abdominal: Soft. She exhibits no distension. There is no tenderness.  Musculoskeletal:  Tenderness to palpation of right anterior shoulder.  Shoulder pain and chest pain reproducible with movement of the right shoulder. No lower extremity edema or calf tenderness.  Intact distal pulses.  Sensation intact x4 as well.  Neurological: She is alert and oriented to person, place, and time.  Skin: Skin is warm and dry.  Nursing note and vitals reviewed.    ED Treatments / Results  Labs (all labs ordered are listed, but only abnormal results are displayed) Labs Reviewed  CBC WITH DIFFERENTIAL/PLATELET  BASIC METABOLIC PANEL  I-STAT TROPONIN, ED    EKG  EKG Interpretation  Date/Time:  Tuesday September 10 2017 13:58:27 EDT Ventricular Rate:  78 PR Interval:    QRS Duration: 118 QT Interval:  414 QTC Calculation: 472 R Axis:   -34 Text Interpretation:  Sinus rhythm Left ventricular hypertrophy Anterior infarct, old Normal sinus rhythm Confirmed by Zenovia Jarred [phone]) on 09/10/2017 2:25:41 PM       Radiology No results found.  Procedures Procedures (including critical care time)  Medications Ordered in ED Medications - No data to display   Initial Impression / Assessment and Plan / ED Course  I have reviewed the triage vital signs and the nursing notes.  Pertinent labs & imaging results that were available during my care of the patient were reviewed by me and considered in my medical decision making (see chart for details).    Lynn Estrada is a 57 y.o. female who presents to ED for right shoulder pain radiating to the chest which began this morning at 11 AM.  On exam, patient is afebrile, hemodynamically stable with crackles to the right lower lung base.  Chest pain is very much reproducible.  She also has pain to the right shoulder which is reproducible  with palpation and movement.  All 4 extremities are neurovascularly intact.  Labs reviewed and reassuring with negative troponin CXR with mild right basilar subsegmental atelectasis versus infiltrate.  Shoulder plain film with degenerative changes, but no acute findings. EKG  non-ischemic.   Low risk heart score of 2. PERC negative.  2nd troponin pending at shift change. Care assumed by oncoming provider PA Lawyer. Case discussed, plan agreed upon. Will follow up on pending troponin. If negative, discharge home with ABX and PCP follow up. She does already have PCP appointment scheduled for next week. Reasons to return to ER were discussed with patient already as well and all questions were answered.  Patient discussed with Dr. Thomasene Lot who agrees with treatment plan.    Final Clinical Impressions(s) / ED Diagnoses   Final diagnoses:  Chest pain    ED Discharge Orders    None       Johann Santone, Ozella Almond, PA-C 09/10/17 1540    [place]ackuen, Fredia Sorrow, MD 09/11/17 [phone]

## 2017-09-11 ENCOUNTER — Ambulatory Visit (INDEPENDENT_AMBULATORY_CARE_PROVIDER_SITE_OTHER): Payer: BLUE CROSS/BLUE SHIELD | Admitting: Physician Assistant

## 2017-09-11 ENCOUNTER — Encounter: Payer: Self-pay | Admitting: Physician Assistant

## 2017-09-11 VITALS — BP 151/74 | HR 65 | Temp 99.1°F | Resp 17 | Ht 73.0 in | Wt 221.0 lb

## 2017-09-11 DIAGNOSIS — J189 Pneumonia, unspecified organism: Secondary | ICD-10-CM | POA: Diagnosis not present

## 2017-09-11 MED ORDER — TRAMADOL HCL 50 MG PO TABS: 50.0000 mg | ORAL_TABLET | Freq: Three times a day (TID) | ORAL | 0 refills | Status: DC | PRN

## 2017-09-11 MED ORDER — GUAIFENESIN ER 1200 MG PO TB12: 1.0000 | ORAL_TABLET | Freq: Two times a day (BID) | ORAL | 1 refills | Status: DC | PRN

## 2017-09-11 NOTE — Progress Notes (Signed)
[facility] CARE AT [facility] [address], [place] [address] [phone]  Date:  09/11/2017   Name:  Lynn Estrada   DOB:  July 17, 1960   MRN:  [id number]  PCP:  [name], PA-C    History of Present Illness:  Lynn Estrada is a 57 y.o. female patient who presents to PCP with  Chief Complaint  Patient presents with  . Chest Pain    patient said she went to ED last night and it was not her heart ; feels like muscle pain     Patient was at work 11am yesterday, when she developed chest pain.  She recalls the pain started at her right shoulder and radiated down to the center of her chest.  She has pain with breathing.  Pain with laying down.  Feels like something is pulling her chest. She has no coughing.  No nausea or dizziness.   Patient was seen the day before for the chest pain.  Mild right basilar subsegmental atelectasis or infiltrate was found on the imaging.  She was instructed to pick up an antibiotic, however she did not last night.  Patient Active Problem List   Diagnosis Date Noted  . Ureteral stone with hydronephrosis 12/31/2016  . Anemia 07/10/2016  . Essential hypertension 07/26/2015  . Eczema 07/26/2015  . BMI 29.0-29.9,adult 07/26/2015    Past Medical History:  Diagnosis Date  . Anemia   . Eczema   . GERD (gastroesophageal reflux disease)   . History of adenomatous polyp of colon    08-03-2016  tubular adenoma x2 and hyperplastic  . History of chronic gastritis 08/03/2016  . History of ectopic pregnancy 1988   s/p  left salpingectomy  . History of endometriosis   . History of kidney stones   . History of partial nephrectomy    right pelvis for very large stone  . History of pneumonia 07/02/2016   CAP  . History of sepsis    07-01-2016  sepsis w/ pyelonephritis, CAP /   12-31-2016  urosepsis w/ kidney stone obstruction  . History of uterine leiomyoma   . Hypertension   . Left ureteral stone   . Nephrolithiasis    left obstructive stone  and right non-obstructive stone  per CT 12-31-2016  . Urgency of urination     Past Surgical History:  Procedure Laterality Date  . ABDOMINAL HYSTERECTOMY  01/20/2007   w/ Lysis Adhesions/  Left salpingoophorectomy/  Right Salpingectomy  . CHOLECYSTECTOMY  1994   and Right Partial Nephrectomy (pelvis for very large stone)  . CYSTOSCOPY W/ URETERAL STENT PLACEMENT Left 12/31/2016   Procedure: CYSTOSCOPY WITH RETROGRADE PYELOGRAM/ LEFT URETERAL STENT PLACEMENT;  Surgeon: Alexis Frock, MD;  Location: [facility];  Service: Urology;  Laterality: Left;  . CYSTOSCOPY WITH RETROGRADE PYELOGRAM, URETEROSCOPY AND STENT PLACEMENT Left 01/18/2017   Procedure: 1ST STAGE CYSTOSCOPY WITH RETROGRADE PYELOGRAM, URETEROSCOPY AND STENT REPLACEMENT;  Surgeon: Alexis Frock, MD;  Location: [facility];  Service: Urology;  Laterality: Left;  . CYSTOSCOPY WITH RETROGRADE PYELOGRAM, URETEROSCOPY AND STENT PLACEMENT Left 02/01/2017   Procedure: 2ND STAGE CYSTOSCOPY WITH RETROGRADE PYELOGRAM, URETEROSCOPY AND STENT REPLACEMENT;  Surgeon: Alexis Frock, MD;  Location: [facility];  Service: Urology;  Laterality: Left;  . ECTOPIC PREGNANCY SURGERY  1988   Left Salpingectomy  . HOLMIUM LASER APPLICATION Left 03/03/4096   Procedure: HOLMIUM LASER APPLICATION;  Surgeon: Alexis Frock, MD;  Location: [facility];  Service: Urology;  Laterality: Left;  .  HOLMIUM LASER APPLICATION Left 10/05/2701   Procedure: HOLMIUM LASER APPLICATION;  Surgeon: Alexis Frock, MD;  Location: [facility];  Service: Urology;  Laterality: Left;  . LUMBAR DISC SURGERY  01/1999   right L5 -- S1  . RE-EXPLORATION LUMBAR/  LAMINECTOMY AND MICRODISKECTOMY  10/14/2001   right L5 -- S1  . TUBAL LIGATION Bilateral 10/17/1999   PPTL  . UMBILICAL HERNIA REPAIR  age 64    Social History   Tobacco Use  . Smoking status: Former Smoker    Packs/day: 0.75    Years: 5.00    Pack years: 3.75     Types: Cigarettes    Last attempt to quit: 01/10/1997    Years since quitting: 20.6  . Smokeless tobacco: Never Used  Substance Use Topics  . Alcohol use: Yes    Alcohol/week: 0.0 oz    Comment: social use; once a month  . Drug use: No    Family History  Problem Relation Age of Onset  . Hypertension Sister   . Eczema Sister   . Lupus Sister   . Sarcoidosis Mother   . Prostate cancer Father   . Gout Maternal Grandmother   . Diabetes Maternal Grandmother        leg amputations  . Breast cancer Maternal Aunt   . Stomach cancer Neg Hx   . Colon cancer Neg Hx     Allergies  Allergen Reactions  . Bactrim [Sulfamethoxazole-Trimethoprim] Rash  . Latex Rash    Medication list has been reviewed and updated.  Current Outpatient Medications on File Prior to Visit  Medication Sig Dispense Refill  . amLODipine (NORVASC) 10 MG tablet TAKE 1 TABLET (10 MG TOTAL) BY MOUTH DAILY. 90 tablet 3  . aspirin 81 MG chewable tablet Chew 81 mg by mouth daily.    . Multiple Vitamins-Minerals (MULTIVITAMIN ADULT PO) Take 1 tablet by mouth daily.    . hydrochlorothiazide (HYDRODIURIL) 25 MG tablet Take 1 tablet (25 mg total) by mouth daily. (Patient not taking: Reported on 09/11/2017) 90 tablet 3   No current facility-administered medications on file prior to visit.     ROS ROS otherwise unremarkable unless listed above.  Physical Examination: BP (!) 151/74   Pulse 65   Temp 99.1 F (37.3 C) (Oral)   Resp 17   Ht 6\' 1"  (1.854 m)   Wt 221 lb (100.2 kg)   SpO2 98%   BMI 29.16 kg/m  Ideal Body Weight: Weight in (lb) to have BMI = 25: 189.1  Physical Exam  Constitutional: She is oriented to person, place, and time. She appears well-developed and well-nourished. No distress.  HENT:  Head: Normocephalic and atraumatic.  Right Ear: Tympanic membrane, external ear and ear canal normal.  Left Ear: Tympanic membrane, external ear and ear canal normal.  Nose: No mucosal edema or  rhinorrhea. Right sinus exhibits no maxillary sinus tenderness and no frontal sinus tenderness. Left sinus exhibits no maxillary sinus tenderness and no frontal sinus tenderness.  Mouth/Throat: No uvula swelling. No oropharyngeal exudate, posterior oropharyngeal edema or posterior oropharyngeal erythema.  Eyes: Pupils are equal, round, and reactive to light. Conjunctivae and EOM are normal.  Cardiovascular: Normal rate and regular rhythm. Exam reveals no gallop, no distant heart sounds and no friction rub.  No murmur heard. Pulmonary/Chest: Effort normal. No respiratory distress. She has no decreased breath sounds. She has no wheezes. She has no rhonchi.  Lymphadenopathy:       Head (right side): No submandibular,  no tonsillar, no preauricular and no posterior auricular adenopathy present.       Head (left side): No submandibular, no tonsillar, no preauricular and no posterior auricular adenopathy present.  Neurological: She is alert and oriented to person, place, and time.  Skin: She is not diaphoretic.  Psychiatric: She has a normal mood and affect. Her behavior is normal.    Dg Chest 2 View  Result Date: 09/10/2017 CLINICAL DATA:  Chest pain. EXAM: CHEST - 2 VIEW COMPARISON:  Radiographs of December 31, 2016. FINDINGS: Stable cardiomegaly. Atherosclerosis of thoracic aorta is noted. No pneumothorax is noted. No pleural effusion is noted. Left lung is clear. Mild right basilar subsegmental atelectasis or infiltrate is noted. Bony thorax is unremarkable. IMPRESSION: Mild right basilar subsegmental atelectasis or infiltrate. Followup PA and lateral chest X-ray is recommended in 3-4 weeks following trial of antibiotic therapy to ensure resolution and exclude underlying malignancy. Aortic Atherosclerosis (ICD10-I70.0). Electronically Signed   By: Marijo Conception, M.D.   On: 09/10/2017 15:13   Dg Shoulder Right  Result Date: 09/10/2017 CLINICAL DATA:  Acute right shoulder pain without known injury. EXAM:  RIGHT SHOULDER - 2+ VIEW COMPARISON:  None. FINDINGS: There is no evidence of fracture or dislocation. Mild degenerative changes seen involving the right acromioclavicular joint. Soft tissues are unremarkable. 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 No acute abnormality seen in the right shoulder. Electronically Signed   By: Marijo Conception, M.D.   On: 09/10/2017 15:16     Assessment and Plan: Lynn Estrada is a 57 y.o. female who is here today for cc of  Chief Complaint  Patient presents with  . Chest Pain    patient said she went to ED last night and it was not her heart ; feels like muscle pain  Advised to pick up the azithromycin today and now.  Discussed precautions as well as alarming symptoms to warrant an immediate return.  Went over her chest x-ray with her.  She is also given tramadol today for her pain.  Vitals within normal limits, she needs to start antibiotic immediately. Pneumonia of right lung due to infectious organism, unspecified part of lung - Plan: DISCONTINUED: Guaifenesin (MUCINEX MAXIMUM STRENGTH) 1200 MG TB12, DISCONTINUED: traMADol (ULTRAM) 50 MG tablet  Ivar Drape, PA-C [facility] 3/29/20198:18 AM

## 2017-09-11 NOTE — Patient Instructions (Addendum)
Please take in water 64 oz if not more at this time.  I would like you to temporarily take ibuprofen 600mg  every 8 hours as needed.     Community-Acquired Pneumonia, Adult Pneumonia is an infection of the lungs. One type of pneumonia can happen while a person is in a hospital. A different type can happen when a person is not in a hospital (community-acquired pneumonia). It is easy for this kind to spread from person to person. It can spread to you if you breathe near an infected person who coughs or sneezes. Some symptoms include:  A dry cough.  A wet (productive) cough.  Fever.  Sweating.  Chest pain.  Follow these instructions at home:  Take over-the-counter and prescription medicines only as told by your doctor. ? Only take cough medicine if you are losing sleep. ? If you were prescribed an antibiotic medicine, take it as told by your doctor. Do not stop taking the antibiotic even if you start to feel better.  Sleep with your head and neck raised (elevated). You can do this by putting a few pillows under your head, or you can sleep in a recliner.  Do not use tobacco products. These include cigarettes, chewing tobacco, and e-cigarettes. If you need help quitting, ask your doctor.  Drink enough water to keep your pee (urine) clear or pale yellow. A shot (vaccine) can help prevent pneumonia. Shots are often suggested for:  People older than 56 years of age.  People older than 56 years of age: ? Who are having cancer treatment. ? Who have long-term (chronic) lung disease. ? Who have problems with their body's defense system (immune system).  You may also prevent pneumonia if you take these actions:  Get the flu (influenza) shot every year.  Go to the dentist as often as told.  Wash your hands often. If soap and water are not available, use hand sanitizer.  Contact a doctor if:  You have a fever.  You lose sleep because your cough medicine does not help. Get help right  away if:  You are short of breath and it gets worse.  You have more chest pain.  Your sickness gets worse. This is very serious if: ? You are an older adult. ? Your body's defense system is weak.  You cough up blood. This information is not intended to replace advice given to you by your health care provider. Make sure you discuss any questions you have with your health care provider. Document Released: 12/05/2007 Document Revised: 11/24/2015 Document Reviewed: 10/13/2014 Elsevier Interactive Patient Education  2018 [employer].   IF you received an x-ray today, you will receive an invoice from [facility] Radiology. Please contact [facility] at [phone] with questions or concerns regarding your invoice.   IF you received labwork today, you will receive an invoice from [facility]. Please contact [facility] at [phone] with questions or concerns regarding your invoice.   Our billing staff will not be able to assist you with questions regarding bills from these companies.  You will be contacted with the lab results as soon as they are available. The fastest way to get your results is to activate your My Chart account. Instructions are located on the last page of this paperwork. If you have not heard from [place] regarding the results in 2 weeks, please contact this office.

## 2017-09-25 ENCOUNTER — Encounter: Payer: Self-pay | Admitting: Physician Assistant

## 2017-09-25 ENCOUNTER — Ambulatory Visit (INDEPENDENT_AMBULATORY_CARE_PROVIDER_SITE_OTHER): Payer: BLUE CROSS/BLUE SHIELD

## 2017-09-25 ENCOUNTER — Ambulatory Visit (INDEPENDENT_AMBULATORY_CARE_PROVIDER_SITE_OTHER): Payer: BLUE CROSS/BLUE SHIELD | Admitting: Physician Assistant

## 2017-09-25 ENCOUNTER — Other Ambulatory Visit: Payer: Self-pay

## 2017-09-25 VITALS — BP 118/64 | HR 97 | Temp 98.3°F | Resp 16 | Ht 73.0 in | Wt 222.0 lb

## 2017-09-25 DIAGNOSIS — L723 Sebaceous cyst: Secondary | ICD-10-CM

## 2017-09-25 DIAGNOSIS — G8929 Other chronic pain: Secondary | ICD-10-CM

## 2017-09-25 DIAGNOSIS — M25512 Pain in left shoulder: Secondary | ICD-10-CM

## 2017-09-25 DIAGNOSIS — R232 Flushing: Secondary | ICD-10-CM

## 2017-09-25 DIAGNOSIS — R05 Cough: Secondary | ICD-10-CM | POA: Diagnosis not present

## 2017-09-25 DIAGNOSIS — L309 Dermatitis, unspecified: Secondary | ICD-10-CM

## 2017-09-25 DIAGNOSIS — R059 Cough, unspecified: Secondary | ICD-10-CM

## 2017-09-25 MED ORDER — BENZONATATE 100 MG PO CAPS: 100.0000 mg | ORAL_CAPSULE | Freq: Three times a day (TID) | ORAL | 0 refills | Status: DC | PRN

## 2017-09-25 MED ORDER — VENLAFAXINE HCL ER 37.5 MG PO CP24: 37.5000 mg | ORAL_CAPSULE | Freq: Every day | ORAL | 3 refills | Status: DC

## 2017-09-25 MED ORDER — TRIAMCINOLONE ACETONIDE 0.1 % EX CREA: 1.0000 "application " | TOPICAL_CREAM | Freq: Two times a day (BID) | CUTANEOUS | 0 refills | Status: DC

## 2017-09-25 NOTE — Progress Notes (Signed)
Patient ID: Lynn Estrada, female    DOB: 22-Dec-1960, 57 y.o.   MRN: [id number]  PCP: [name], PA-C  Chief Complaint  Patient presents with  . Hospitalization Follow-up    hot flashes, chest pain    Subjective:   Presents for evaluation of hot flashes and recent ED visit for chest/shoulder pain.  Seen in the ED 09/10/2017 reporting chest pain and RIGHT shoulder pain. CXR revealed mild RIGHT basilar subsegmental atelectasis vs infiltrate. Shoulder radiographs revealed degenerative changes, nothing acute. She was prescribed azithromycin.  Seen here by one of my colleagues 09/11/2017. Guaifenesin and tramadol were added to her regimen.  Continues to cough. Feels bad. Not SOB.  LEFT shoulder pain.  Unable to raise the arm to 90 degrees due to pain. Pain is posterior.  "Hair bump." RIGHT posterolateral thigh. Squeezing it doesn't produce any material.  Hot flashes persist.  Review of Systems As above. No urinary or GI symptoms.    Patient Active Problem List   Diagnosis Date Noted  . Ureteral stone with hydronephrosis 12/31/2016  . Anemia 07/10/2016  . Essential hypertension 07/26/2015  . Eczema 07/26/2015  . BMI 29.0-29.9,adult 07/26/2015     Prior to Admission medications   Medication Sig Start Date End Date Taking? Authorizing Provider  amLODipine (NORVASC) 10 MG tablet TAKE 1 TABLET (10 MG TOTAL) BY MOUTH DAILY. 09/05/17   [name], PA-C  aspirin 81 MG chewable tablet Chew 81 mg by mouth daily.    [provider]  Guaifenesin (MUCINEX MAXIMUM STRENGTH) 1200 MG TB12 Take 1 tablet (1,200 mg total) by mouth every 12 (twelve) hours as needed. Patient not taking: Reported on 09/25/2017 09/11/17   Ivar Drape D, PA  hydrochlorothiazide (HYDRODIURIL) 25 MG tablet Take 1 tablet (25 mg total) by mouth daily. Patient not taking: Reported on 09/11/2017 08/01/16   [name], PA-C  Multiple Vitamins-Minerals (MULTIVITAMIN ADULT PO)  Take 1 tablet by mouth daily.    [provider]  traMADol (ULTRAM) 50 MG tablet Take 1 tablet (50 mg total) by mouth every 8 (eight) hours as needed. Patient not taking: Reported on 09/25/2017 09/11/17   Ivar Drape D, PA     Allergies  Allergen Reactions  . Bactrim [Sulfamethoxazole-Trimethoprim] Rash  . Latex Rash       Objective:  Physical Exam  Constitutional: She is oriented to person, place, and time. She appears well-developed and well-nourished. She is active and cooperative. No distress.  BP 118/64   Pulse 97   Temp 98.3 F (36.8 C)   Resp 16   Ht 6\' 1"  (1.854 m)   Wt 222 lb (100.7 kg)   SpO2 97%   BMI 29.29 kg/m   HENT:  Head: Normocephalic and atraumatic.  Right Ear: Hearing normal.  Left Ear: Hearing normal.  Eyes: Conjunctivae are normal. No scleral icterus.  Neck: Normal range of motion. Neck supple. No thyromegaly present.  Cardiovascular: Normal rate, regular rhythm and normal heart sounds.  Pulses:      Radial pulses are 2+ on the right side, and 2+ on the left side.  Pulmonary/Chest: Effort normal and breath sounds normal.  Musculoskeletal:       Right shoulder: Normal.       Left shoulder: She exhibits decreased range of motion, tenderness, pain and decreased strength (due to pain). She exhibits no bony tenderness, no swelling, no effusion, no crepitus, no deformity, no laceration, no spasm and normal pulse.  Left elbow: Normal.       Left wrist: Normal.       Cervical back: Normal.       Left upper arm: Normal.       Left forearm: Normal.       Left hand: Normal. Normal sensation noted. Normal strength noted.  Lymphadenopathy:       Head (right side): No tonsillar, no preauricular, no posterior auricular and no occipital adenopathy present.       Head (left side): No tonsillar, no preauricular, no posterior auricular and no occipital adenopathy present.    She has no cervical adenopathy.       Right: No supraclavicular  adenopathy present.       Left: No supraclavicular adenopathy present.  Neurological: She is alert and oriented to person, place, and time. No sensory deficit.  Skin: Skin is warm, dry and intact. No rash noted. No cyanosis or erythema. Nails show no clubbing.     Psychiatric: She has a normal mood and affect. Her speech is normal and behavior is normal.   Wt Readings from Last 3 Encounters:  09/25/17 222 lb (100.7 kg)  09/11/17 221 lb (100.2 kg)  09/10/17 222 lb (100.7 kg)           Assessment & Plan:   Problem List Items Addressed This Visit    Eczema    Stable, intermittent flares. Continue PRN steroid cream.      Relevant Medications   triamcinolone cream (KENALOG) 0.1 %    Other Visit Diagnoses    Cough    -  Primary   post-infection cough. Continue tessalon perles. repeat CXR in 2-4 weeks.   Relevant Medications   benzonatate (TESSALON) 100 MG capsule   Vasomotor flushing       Trial of venlafaxine.   Relevant Medications   venlafaxine XR (EFFEXOR-XR) 37.5 MG 24 hr capsule   Chronic left shoulder pain       Await radiographs. Anticipate referral to orthopedics.   Relevant Medications   venlafaxine XR (EFFEXOR-XR) 37.5 MG 24 hr capsule   Other Relevant Orders   DG Shoulder Left (Completed)   Inflamed sebaceous cyst       Schedule excision at her conveninece.       Return in about 1 month (around 10/23/2017) for re-evaluation of cough, flushing, and excision of sebaceous cyst .   Fara Chute, PA-C [facility] at [place]

## 2017-09-25 NOTE — Patient Instructions (Signed)
     IF you received an x-ray today, you will receive an invoice from [facility]. Please contact [facility] at [phone] with questions or concerns regarding your invoice.   IF you received labwork today, you will receive an invoice from [facility]. Please contact [facility] at [phone] with questions or concerns regarding your invoice.   Our billing staff will not be able to assist you with questions regarding bills from these companies.  You will be contacted with the lab results as soon as they are available. The fastest way to get your results is to activate your My Chart account. Instructions are located on the last page of this paperwork. If you have not heard from us regarding the results in 2 weeks, please contact this office.     

## 2017-09-26 NOTE — Assessment & Plan Note (Signed)
Stable, intermittent flares. Continue PRN steroid cream.

## 2017-10-01 ENCOUNTER — Encounter: Payer: Self-pay | Admitting: Physician Assistant

## 2017-10-07 ENCOUNTER — Encounter: Payer: Self-pay | Admitting: Physician Assistant

## 2017-10-29 ENCOUNTER — Ambulatory Visit (INDEPENDENT_AMBULATORY_CARE_PROVIDER_SITE_OTHER): Payer: BLUE CROSS/BLUE SHIELD | Admitting: Physician Assistant

## 2017-10-29 ENCOUNTER — Encounter: Payer: Self-pay | Admitting: Physician Assistant

## 2017-10-29 ENCOUNTER — Other Ambulatory Visit: Payer: Self-pay

## 2017-10-29 VITALS — BP 124/78 | HR 102 | Temp 99.4°F | Resp 16 | Ht 73.0 in | Wt 219.0 lb

## 2017-10-29 DIAGNOSIS — R232 Flushing: Secondary | ICD-10-CM | POA: Diagnosis not present

## 2017-10-29 DIAGNOSIS — R05 Cough: Secondary | ICD-10-CM | POA: Diagnosis not present

## 2017-10-29 DIAGNOSIS — R059 Cough, unspecified: Secondary | ICD-10-CM

## 2017-10-29 DIAGNOSIS — L723 Sebaceous cyst: Secondary | ICD-10-CM

## 2017-10-29 NOTE — Patient Instructions (Addendum)
Stay cool, and Stay Cool!    IF you received an x-ray today, you will receive an invoice from [facility]. Please contact [facility] at [phone] with questions or concerns regarding your invoice.   IF you received labwork today, you will receive an invoice from [facility]. Please contact [facility] at [phone] with questions or concerns regarding your invoice.   Our billing staff will not be able to assist you with questions regarding bills from these companies.  You will be contacted with the lab results as soon as they are available. The fastest way to get your results is to activate your My Chart account. Instructions are located on the last page of this paperwork. If you have not heard from [place] regarding the results in 2 weeks, please contact this office.

## 2017-10-29 NOTE — Progress Notes (Signed)
Patient ID: Lynn Estrada, female    DOB: [birth year], 57 y.o.   MRN: [id number]  PCP: [name], PA-C  Chief Complaint  Patient presents with  . Cough    and flushing is better   . Cyst    sebaceous     Subjective:   Presents for evaluation of cough and a sebaceous cyst.  I evaluated her for this cough on 09/25/2017. She had been seen in the emergency department on 09/10/2017 reporting chest pain and right shoulder pain.  Chest radiographs revealed a mild right basilar subsegmental atelectasis versus infiltrate and she was prescribed a azithromycin.  Shoulder radiographs revealed degenerative changes but nothing acute.  At her follow-up visit here on 09/11/2017, guaifenesin and tramadol were added.  At her visit with me on 09/25/2017, her cough continued and she reported generalized malaise.  Shoulder pain persisted.  She also reported vasomotor flushing associated with perimenopause and pointed out a sebaceous cyst on the right lateral thigh. Benzonatate was added for cough. Venlafaxine X are 37.5 mg was added for vasomotor flushing. She was advised to return for skin lesion excision at her convenience.  Cough is resolved. Flushing is well controlled. She would like to have the sebaceous cyst from the right lateral thigh excised today.  Review of Systems As above.    Patient Active Problem List   Diagnosis Date Noted  . Ureteral stone with hydronephrosis 12/31/2016  . Anemia 07/10/2016  . Essential hypertension 07/26/2015  . Eczema 07/26/2015  . BMI 29.0-29.9,adult 07/26/2015     Prior to Admission medications   Medication Sig Start Date End Date Taking? Authorizing Provider  amLODipine (NORVASC) 10 MG tablet TAKE 1 TABLET (10 MG TOTAL) BY MOUTH DAILY. 09/05/17  Yes Razi Hickle, PA-C  aspirin 81 MG chewable tablet Chew 81 mg by mouth daily.   Yes [provider]  Multiple Vitamins-Minerals (MULTIVITAMIN ADULT PO) Take 1 tablet by mouth daily.    Yes [provider]  triamcinolone cream (KENALOG) 0.1 % Apply 1 application topically 2 (two) times daily. 09/25/17  Yes Dondra Rhett, PA-C  venlafaxine XR (EFFEXOR-XR) 37.5 MG 24 hr capsule Take 1 capsule (37.5 mg total) by mouth daily with breakfast. 09/25/17  Yes Fallynn Gravett, PA-C     Allergies  Allergen Reactions  . Bactrim [Sulfamethoxazole-Trimethoprim] Rash  . Latex Rash       Objective:  Physical Exam  Constitutional: She is oriented to person, place, and time. She appears well-developed and well-nourished. She is active and cooperative. No distress.  BP 124/78   Pulse (!) 102   Temp 99.4 F (37.4 C)   Resp 16   Ht 6\' 1"  (1.854 m)   Wt 219 lb (99.3 kg)   SpO2 97%   BMI 28.89 kg/m    Eyes: Conjunctivae are normal.  Pulmonary/Chest: Effort normal.  Neurological: She is alert and oriented to person, place, and time.  Skin: No lesion and no rash noted.  Psychiatric: She has a normal mood and affect. Her speech is normal and behavior is normal.           Assessment & Plan:   1. Vasomotor flushing Well-controlled on venlafaxine ER 37.5 mg daily.  Continue.  2. Cough Resolved.  3. Sebaceous cyst Resolved lesion.  Nothing to excise at this time.    Return in about 5 months (around 03/31/2018) for re-evaluation of blood pressure, etc.   Fara Chute, PA-C [facility] at [facility]

## 2018-09-18 DIAGNOSIS — Z6829 Body mass index (BMI) 29.0-29.9, adult: Secondary | ICD-10-CM | POA: Diagnosis not present

## 2018-09-18 DIAGNOSIS — I10 Essential (primary) hypertension: Secondary | ICD-10-CM | POA: Diagnosis not present

## 2018-09-18 DIAGNOSIS — R252 Cramp and spasm: Secondary | ICD-10-CM | POA: Diagnosis not present

## 2018-12-01 IMAGING — CR DG CHEST 2V
2 series · 2 of 2 positions shown · non-contrast
Comparison: 07/05/2016

CLINICAL DATA: Fever with abdominal pain and dizziness

EXAM:
CHEST  2 VIEW

[w chest pa]
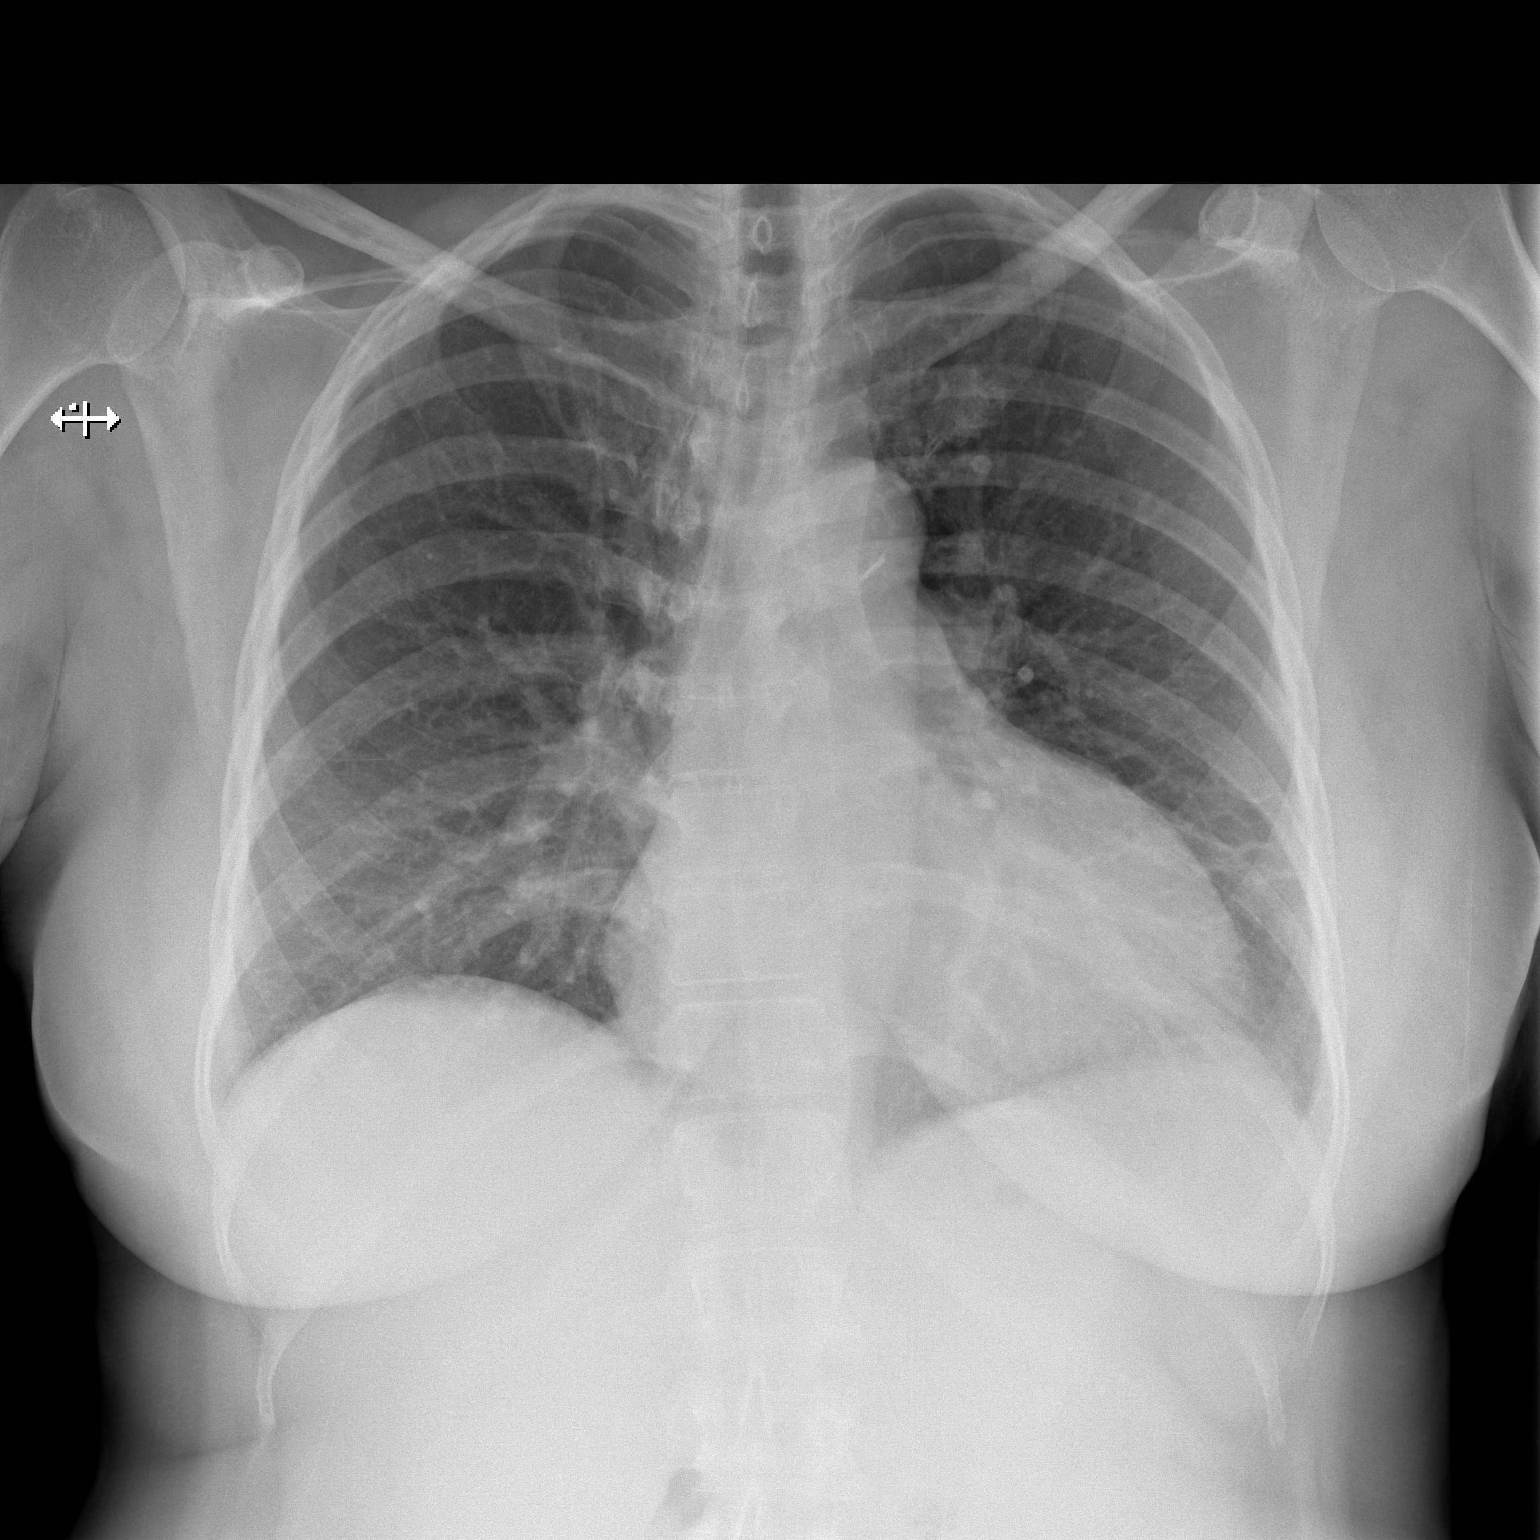

[w chest lat]
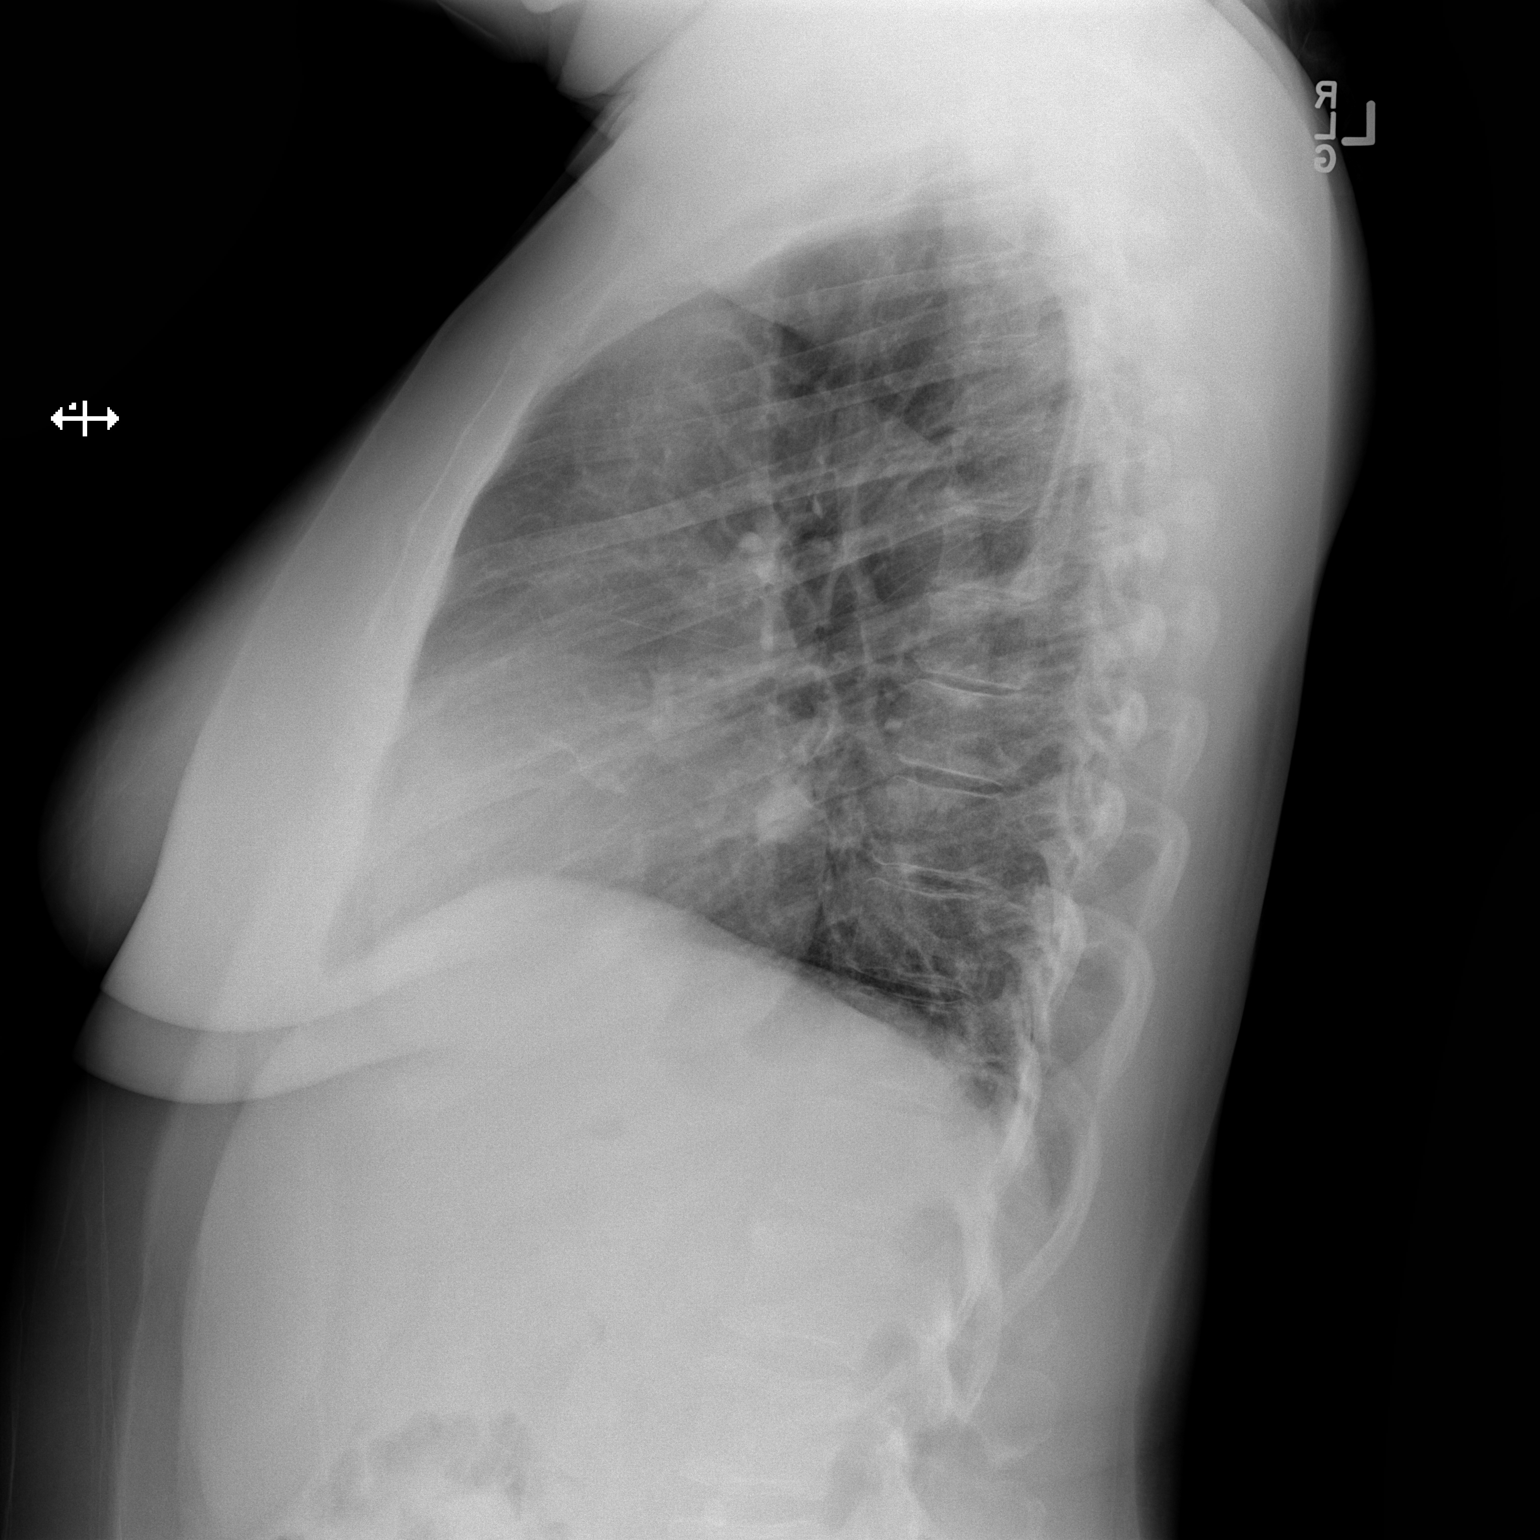

[2 of 2 positions shown; findings below may reference images not displayed]

FINDINGS: Cardiomegaly, stable. Few linear opacities in the lingula, likely
scarring based on 07/02/2016 study. Pulmonary edema or infiltrates
have resolved since prior. No Kerley lines, effusion, or
pneumothorax. Atherosclerotic calcification of the aortic arch.
IMPRESSION: No visible pneumonia.

Chronic cardiomegaly.

## 2019-04-13 DIAGNOSIS — Z1231 Encounter for screening mammogram for malignant neoplasm of breast: Secondary | ICD-10-CM | POA: Diagnosis not present

## 2019-04-24 DIAGNOSIS — G5603 Carpal tunnel syndrome, bilateral upper limbs: Secondary | ICD-10-CM | POA: Diagnosis not present

## 2019-04-24 DIAGNOSIS — N649 Disorder of breast, unspecified: Secondary | ICD-10-CM | POA: Diagnosis not present

## 2019-04-24 DIAGNOSIS — Z23 Encounter for immunization: Secondary | ICD-10-CM | POA: Diagnosis not present

## 2019-04-24 DIAGNOSIS — I10 Essential (primary) hypertension: Secondary | ICD-10-CM | POA: Diagnosis not present

## 2019-04-24 DIAGNOSIS — L821 Other seborrheic keratosis: Secondary | ICD-10-CM | POA: Diagnosis not present

## 2019-06-03 ENCOUNTER — Other Ambulatory Visit: Payer: Self-pay

## 2019-06-03 DIAGNOSIS — Z20822 Contact with and (suspected) exposure to covid-19: Secondary | ICD-10-CM

## 2019-06-05 LAB — NOVEL CORONAVIRUS, NAA

## 2019-06-09 ENCOUNTER — Telehealth: Payer: Self-pay | Admitting: *Deleted

## 2019-06-09 NOTE — Telephone Encounter (Signed)
Pt states returning call regarding covid results. MyChart message left states positive covid however lab notes pt needs to be retested. Pt verbalizes understanding. States she will go to [facility] site tomorrow. Pt has subjective fever, weakness. Advised to continue quarantine except for testing site.

## 2019-06-10 ENCOUNTER — Other Ambulatory Visit: Payer: Self-pay

## 2019-06-10 DIAGNOSIS — Z20822 Contact with and (suspected) exposure to covid-19: Secondary | ICD-10-CM

## 2019-06-11 ENCOUNTER — Other Ambulatory Visit: Payer: Self-pay

## 2019-06-11 ENCOUNTER — Emergency Department (HOSPITAL_COMMUNITY): Payer: BC Managed Care – PPO

## 2019-06-11 ENCOUNTER — Inpatient Hospital Stay (HOSPITAL_COMMUNITY)
Admission: EM | Admit: 2019-06-11 | Discharge: 2019-06-17 | DRG: 193 | Disposition: A | Discharge status: Home / Self Care | Payer: BC Managed Care – PPO | Attending: Internal Medicine | Admitting: Internal Medicine

## 2019-06-11 DIAGNOSIS — Z832 Family history of diseases of the blood and blood-forming organs and certain disorders involving the immune mechanism: Secondary | ICD-10-CM

## 2019-06-11 DIAGNOSIS — J189 Pneumonia, unspecified organism: Principal | ICD-10-CM | POA: Diagnosis present

## 2019-06-11 DIAGNOSIS — Z905 Acquired absence of kidney: Secondary | ICD-10-CM | POA: Diagnosis not present

## 2019-06-11 DIAGNOSIS — Z87442 Personal history of urinary calculi: Secondary | ICD-10-CM

## 2019-06-11 DIAGNOSIS — Z20828 Contact with and (suspected) exposure to other viral communicable diseases: Secondary | ICD-10-CM | POA: Diagnosis present

## 2019-06-11 DIAGNOSIS — J9601 Acute respiratory failure with hypoxia: Secondary | ICD-10-CM | POA: Diagnosis present

## 2019-06-11 DIAGNOSIS — R05 Cough: Secondary | ICD-10-CM | POA: Diagnosis not present

## 2019-06-11 DIAGNOSIS — I10 Essential (primary) hypertension: Secondary | ICD-10-CM | POA: Diagnosis not present

## 2019-06-11 DIAGNOSIS — Z8249 Family history of ischemic heart disease and other diseases of the circulatory system: Secondary | ICD-10-CM

## 2019-06-11 DIAGNOSIS — Z20822 Contact with and (suspected) exposure to covid-19: Secondary | ICD-10-CM | POA: Diagnosis present

## 2019-06-11 LAB — COMPREHENSIVE METABOLIC PANEL
ALT: 28 U/L (ref 0–44)
AST: 30 U/L (ref 15–41)
Albumin: 3.6 g/dL (ref 3.5–5.0)
Alkaline Phosphatase: 66 U/L (ref 38–126)
Anion gap: 11 (ref 5–15)
BUN: 18 mg/dL (ref 6–20)
CO2: 25 mmol/L (ref 22–32)
Calcium: 8.7 mg/dL — ABNORMAL LOW (ref 8.9–10.3)
Chloride: 101 mmol/L (ref 98–111)
Creatinine, Ser: 1.26 mg/dL — ABNORMAL HIGH (ref 0.44–1.00)
GFR calc Af Amer: 55 mL/min — ABNORMAL LOW (ref >60)
GFR calc non Af Amer: 47 mL/min — ABNORMAL LOW (ref >60)
Glucose, Bld: 88 mg/dL (ref 70–99)
Potassium: 4.1 mmol/L (ref 3.5–5.1)
Sodium: 137 mmol/L (ref 135–145)
Total Bilirubin: 0.6 mg/dL (ref 0.3–1.2)
Total Protein: 7.3 g/dL (ref 6.5–8.1)

## 2019-06-11 LAB — CBC
HCT: 38.5 % (ref 36.0–46.0)
Hemoglobin: 11.9 g/dL — ABNORMAL LOW (ref 12.0–15.0)
MCH: 27.5 pg (ref 26.0–34.0)
MCHC: 30.9 g/dL (ref 30.0–36.0)
MCV: 89.1 fL (ref 80.0–100.0)
Platelets: 257 10*3/uL (ref 150–400)
RBC: 4.32 MIL/uL (ref 3.87–5.11)
RDW: 14 % (ref 11.5–15.5)
WBC: 4.3 10*3/uL (ref 4.0–10.5)
nRBC: 0 % (ref 0.0–0.2)

## 2019-06-11 LAB — TROPONIN I (HIGH SENSITIVITY)
Troponin I (High Sensitivity): 23 ng/L — ABNORMAL HIGH (ref <18)
Troponin I (High Sensitivity): 25 ng/L — ABNORMAL HIGH (ref <18)

## 2019-06-11 LAB — ABO/RH: ABO/RH(D): B POS

## 2019-06-11 LAB — RESPIRATORY PANEL BY RT PCR (FLU A&B, COVID)
Influenza A by PCR: NEGATIVE
Influenza B by PCR: NEGATIVE
SARS Coronavirus 2 by RT PCR: NEGATIVE

## 2019-06-11 LAB — POC SARS CORONAVIRUS 2 AG -  ED: SARS Coronavirus 2 Ag: NEGATIVE

## 2019-06-11 LAB — C-REACTIVE PROTEIN: CRP: 2.2 mg/dL — ABNORMAL HIGH (ref <1.0)

## 2019-06-11 LAB — PROCALCITONIN: Procalcitonin: <0.1 ng/mL

## 2019-06-11 LAB — D-DIMER, QUANTITATIVE: D-Dimer, Quant: 1.36 ug/mL-FEU — ABNORMAL HIGH (ref 0.00–0.50)

## 2019-06-11 LAB — LIPASE, BLOOD: Lipase: 66 U/L — ABNORMAL HIGH (ref 11–51)

## 2019-06-11 MED ORDER — ACETAMINOPHEN 325 MG PO TABS
650.0000 mg | ORAL_TABLET | Freq: Four times a day (QID) | ORAL | Status: DC | PRN
  Administered 2019-06-12: 650 mg via ORAL
  Filled 2019-06-11 (×2): qty 2

## 2019-06-11 MED ORDER — GUAIFENESIN-DM 100-10 MG/5ML PO SYRP
10.0000 mL | ORAL_SOLUTION | ORAL | Status: DC | PRN
  Administered 2019-06-12 – 2019-06-16 (×8): 10 mL via ORAL
  Filled 2019-06-11 (×8): qty 10

## 2019-06-11 MED ORDER — SODIUM CHLORIDE 0.9 % IV SOLN
200.0000 mg | Freq: Once | INTRAVENOUS | Status: AC
  Administered 2019-06-11: 200 mg via INTRAVENOUS
  Filled 2019-06-11: qty 200

## 2019-06-11 MED ORDER — SODIUM CHLORIDE 0.9 % IV SOLN
100.0000 mg | Freq: Every day | INTRAVENOUS | Status: AC
  Administered 2019-06-12 – 2019-06-15 (×4): 100 mg via INTRAVENOUS
  Filled 2019-06-11 (×4): qty 100

## 2019-06-11 MED ORDER — ONDANSETRON HCL 4 MG PO TABS: 4.0000 mg | ORAL_TABLET | Freq: Four times a day (QID) | ORAL | Status: DC | PRN

## 2019-06-11 MED ORDER — HYDROCOD POLST-CPM POLST ER 10-8 MG/5ML PO SUER
5.0000 mL | Freq: Two times a day (BID) | ORAL | Status: DC | PRN
  Filled 2019-06-11: qty 5

## 2019-06-11 MED ORDER — ENOXAPARIN SODIUM 40 MG/0.4ML ~~LOC~~ SOLN
40.0000 mg | SUBCUTANEOUS | Status: DC
  Administered 2019-06-12 – 2019-06-16 (×6): 40 mg via SUBCUTANEOUS
  Filled 2019-06-11 (×6): qty 0.4

## 2019-06-11 MED ORDER — ONDANSETRON HCL 4 MG/2ML IJ SOLN: 4.0000 mg | Freq: Four times a day (QID) | INTRAMUSCULAR | Status: DC | PRN

## 2019-06-11 MED ORDER — DEXAMETHASONE 4 MG PO TABS
6.0000 mg | ORAL_TABLET | ORAL | Status: DC
  Administered 2019-06-11 – 2019-06-17 (×7): 6 mg via ORAL
  Filled 2019-06-11 (×3): qty 2
  Filled 2019-06-11: qty 1
  Filled 2019-06-11 (×4): qty 2

## 2019-06-11 MED ORDER — AMLODIPINE BESYLATE 10 MG PO TABS
10.0000 mg | ORAL_TABLET | Freq: Every day | ORAL | Status: DC
  Administered 2019-06-12 – 2019-06-17 (×6): 10 mg via ORAL
  Filled 2019-06-11 (×6): qty 1

## 2019-06-11 MED ORDER — ALBUTEROL SULFATE HFA 108 (90 BASE) MCG/ACT IN AERS
2.0000 | INHALATION_SPRAY | Freq: Four times a day (QID) | RESPIRATORY_TRACT | Status: DC
  Administered 2019-06-11 – 2019-06-17 (×23): 2 via RESPIRATORY_TRACT
  Filled 2019-06-11 (×2): qty 6.7

## 2019-06-11 MED ORDER — SODIUM CHLORIDE 0.9 % IV BOLUS
1000.0000 mL | Freq: Once | INTRAVENOUS | Status: AC
  Administered 2019-06-11: 16:00:00 1000 mL via INTRAVENOUS

## 2019-06-11 MED ORDER — ONDANSETRON HCL 4 MG/2ML IJ SOLN
4.0000 mg | Freq: Once | INTRAMUSCULAR | Status: AC
  Administered 2019-06-11: 16:00:00 4 mg via INTRAVENOUS
  Filled 2019-06-11: qty 2

## 2019-06-11 NOTE — ED Notes (Signed)
Pt O2 reading 88% RA while patient is speaking to staff. 2L [place] applied, O2 now reading 95% on 2L [place]. Dr. Wyvonnia Dusky aware.

## 2019-06-11 NOTE — ED Notes (Signed)
Dr. Alcario Drought aware patient has not voided since arrival. IV fluids running. Will continue to monitor.

## 2019-06-11 NOTE — ED Provider Notes (Signed)
[place] DEPT Provider Note   CSN: [id number] Arrival date & time: 06/11/19  1259     History Chief Complaint  Patient presents with  . Emesis  . Fatigue  . covid exposure    Lynn Estrada is a 58 y.o. female.  Patient here with multiple complaints.  States she has been around family members at Thanksgiving who tested positive for coronavirus. She had an Indeterminate test on December 2 and was tested again yesterday, but does not know the results.  About a week of generalized aches, fatigue, cough, extensive emesis, chills, fever to 100.  States she is not able to keep anything down because she has no appetite and she is coughing and dry heaving having vomiting and posttussive emesis.  She is having some diarrhea as well.  Denies any abdominal pain.  She has chest tightness with coughing which is not exertional or pleuritic.  Does not feel short of breath.  She does feel headache and sore and achy all over.  No leg pain or leg swelling.  She called her doctor today with concern for dehydration and was referred to the ED. patient has had posttussive emesis for about a week and not wanting to eat or drink.  The history is provided by the patient.  Emesis Associated symptoms: abdominal pain, arthralgias, chills, cough, diarrhea, fever, headaches and myalgias        Past Medical History:  Diagnosis Date  . Anemia   . Eczema   . GERD (gastroesophageal reflux disease)   . History of adenomatous polyp of colon    08-03-2016  tubular adenoma x2 and hyperplastic  . History of chronic gastritis 08/03/2016  . History of ectopic pregnancy 1988   s/p  left salpingectomy  . History of endometriosis   . History of kidney stones   . History of partial nephrectomy    right pelvis for very large stone  . History of pneumonia 07/02/2016   CAP  . History of sepsis    07-01-2016  sepsis w/ pyelonephritis, CAP /   12-31-2016  urosepsis w/ kidney stone  obstruction  . History of uterine leiomyoma   . Hypertension   . Left ureteral stone   . Nephrolithiasis    left obstructive stone and right non-obstructive stone  per CT 12-31-2016  . Urgency of urination     Patient Active Problem List   Diagnosis Date Noted  . Ureteral stone with hydronephrosis 12/31/2016  . Anemia 07/10/2016  . Essential hypertension 07/26/2015  . Eczema 07/26/2015  . BMI 29.0-29.9,adult 07/26/2015    Past Surgical History:  Procedure Laterality Date  . ABDOMINAL HYSTERECTOMY  01/20/2007   w/ Lysis Adhesions/  Left salpingoophorectomy/  Right Salpingectomy  . CHOLECYSTECTOMY  1994   and Right Partial Nephrectomy (pelvis for very large stone)  . CYSTOSCOPY W/ URETERAL STENT PLACEMENT Left 12/31/2016   Procedure: CYSTOSCOPY WITH RETROGRADE PYELOGRAM/ LEFT URETERAL STENT PLACEMENT;  Surgeon: Alexis Frock, MD;  Location: [facility];  Service: Urology;  Laterality: Left;  . CYSTOSCOPY WITH RETROGRADE PYELOGRAM, URETEROSCOPY AND STENT PLACEMENT Left 01/18/2017   Procedure: 1ST STAGE CYSTOSCOPY WITH RETROGRADE PYELOGRAM, URETEROSCOPY AND STENT REPLACEMENT;  Surgeon: Alexis Frock, MD;  Location: [facility];  Service: Urology;  Laterality: Left;  . CYSTOSCOPY WITH RETROGRADE PYELOGRAM, URETEROSCOPY AND STENT PLACEMENT Left 02/01/2017   Procedure: 2ND STAGE CYSTOSCOPY WITH RETROGRADE PYELOGRAM, URETEROSCOPY AND STENT REPLACEMENT;  Surgeon: Alexis Frock, MD;  Location: [place];  Service:  Urology;  Laterality: Left;  . ECTOPIC PREGNANCY SURGERY  1988   Left Salpingectomy  . HOLMIUM LASER APPLICATION Left [id number]   Procedure: HOLMIUM LASER APPLICATION;  Surgeon: Alexis Frock, MD;  Location: [facility];  Service: Urology;  Laterality: Left;  . HOLMIUM LASER APPLICATION Left [id number]   Procedure: HOLMIUM LASER APPLICATION;  Surgeon: Alexis Frock, MD;  Location: [facility];  Service: Urology;   Laterality: Left;  . LUMBAR DISC SURGERY  01/1999   right L5 -- S1  . RE-EXPLORATION LUMBAR/  LAMINECTOMY AND MICRODISKECTOMY  10/14/2001   right L5 -- S1  . TUBAL LIGATION Bilateral 10/17/1999   PPTL  . UMBILICAL HERNIA REPAIR  age 47     OB History   No obstetric history on file.     Family History  Problem Relation Age of Onset  . Hypertension Sister   . Eczema Sister   . Lupus Sister   . Sarcoidosis Mother   . Prostate cancer Father   . Gout Maternal Grandmother   . Diabetes Maternal Grandmother        leg amputations  . Breast cancer Maternal Aunt   . Stomach cancer Neg Hx   . Colon cancer Neg Hx     Social History   Tobacco Use  . Smoking status: Former Smoker    Packs/day: 0.75    Years: 5.00    Pack years: 3.75    Types: Cigarettes    Quit date: 01/10/1997    Years since quitting: 22.4  . Smokeless tobacco: Never Used  Substance Use Topics  . Alcohol use: Yes    Alcohol/week: 0.0 standard drinks    Comment: social use; once a month  . Drug use: No    Home Medications Prior to Admission medications   Medication Sig Start Date End Date Taking? Authorizing Provider  amLODipine (NORVASC) 10 MG tablet TAKE 1 TABLET (10 MG TOTAL) BY MOUTH DAILY. 09/05/17   [name], PA  aspirin 81 MG chewable tablet Chew 81 mg by mouth daily.    [provider]  benzonatate (TESSALON) 100 MG capsule Take 1-2 capsules (100-200 mg total) by mouth 3 (three) times daily as needed for cough. 09/25/17   [name], PA  Multiple Vitamins-Minerals (MULTIVITAMIN ADULT PO) Take 1 tablet by mouth daily.    [provider]  triamcinolone cream (KENALOG) 0.1 % Apply 1 application topically 2 (two) times daily. 09/25/17   [name], PA  venlafaxine XR (EFFEXOR-XR) 37.5 MG 24 hr capsule Take 1 capsule (37.5 mg total) by mouth daily with breakfast. 09/25/17   [name], PA    Allergies    Bactrim [sulfamethoxazole-trimethoprim] and Latex  Review of  Systems   Review of Systems  Constitutional: Positive for activity change, appetite change, chills, fatigue and fever.  Respiratory: Positive for cough and chest tightness.   Gastrointestinal: Positive for abdominal pain, diarrhea, nausea and vomiting.  Genitourinary: Negative for dysuria and hematuria.  Musculoskeletal: Positive for arthralgias and myalgias.  Neurological: Positive for weakness and headaches.    Physical Exam Updated Vital Signs BP 110/74 (BP Location: Left Arm)   Pulse 98   Temp 99.7 F (37.6 C) (Oral)   Resp 19   SpO2 96%   Physical Exam Vitals and nursing note reviewed.  Constitutional:      General: She is not in acute distress.    Appearance: She is well-developed. She is obese. She is not ill-appearing.     Comments: texting  on phone  HENT:     Head: Normocephalic and atraumatic.     Mouth/Throat:     Pharynx: No oropharyngeal exudate.  Eyes:     Conjunctiva/sclera: Conjunctivae normal.     Pupils: Pupils are equal, round, and reactive to light.  Neck:     Comments: No meningismus. Cardiovascular:     Rate and Rhythm: Normal rate and regular rhythm.     Heart sounds: Normal heart sounds. No murmur.  Pulmonary:     Effort: Pulmonary effort is normal. No respiratory distress.     Breath sounds: Normal breath sounds.  Chest:     Chest wall: No tenderness.  Abdominal:     Palpations: Abdomen is soft.     Tenderness: There is no abdominal tenderness. There is no guarding or rebound.  Musculoskeletal:        General: No tenderness. Normal range of motion.     Cervical back: Normal range of motion and neck supple.  Skin:    General: Skin is warm.     Capillary Refill: Capillary refill takes less than 2 seconds.  Neurological:     General: No focal deficit present.     Mental Status: She is alert and oriented to person, place, and time. Mental status is at baseline.     Cranial Nerves: No cranial nerve deficit.     Motor: No abnormal muscle tone.      Coordination: Coordination normal.     Comments:  5/5 strength throughout. CN 2-12 intact.Equal grip strength.   Psychiatric:        Behavior: Behavior normal.     ED Results / Procedures / Treatments   Labs (all labs ordered are listed, but only abnormal results are displayed) Labs Reviewed  CBC - Abnormal; Notable for the following components:      Result Value   Hemoglobin 11.9 (*)    All other components within normal limits  LIPASE, BLOOD  COMPREHENSIVE METABOLIC PANEL  URINALYSIS, ROUTINE W REFLEX MICROSCOPIC  D-DIMER, QUANTITATIVE (NOT AT [facility])  POC SARS CORONAVIRUS 2 AG -  ED  TROPONIN I (HIGH SENSITIVITY)    EKG EKG Interpretation  Date/Time:  Thursday June 11 2019 16:24:33 EST Ventricular Rate:  82 PR Interval:  162 QRS Duration: 116 QT Interval:  394 QTC Calculation: 460 R Axis:   19 Text Interpretation: Normal sinus rhythm Incomplete left bundle branch block Borderline ECG No significant change was found Confirmed by Ezequiel Essex [phone]) on 06/11/2019 4:29:11 PM   Radiology No results found.  Procedures Procedures (including critical care time)  Medications Ordered in ED Medications  sodium chloride 0.9 % bolus 1,000 mL (has no administration in time range)  ondansetron (ZOFRAN) injection 4 mg (has no administration in time range)    ED Course  I have reviewed the triage vital signs and the nursing notes.  Pertinent labs & imaging results that were available during my care of the patient were reviewed by me and considered in my medical decision making (see chart for details).    MDM Rules/Calculators/A&P     CHA2DS2/VAS Stroke Risk Points      N/A >= 2 Points: High Risk  1 - 1.99 Points: Medium Risk  0 Points: Low Risk    A final score could not be computed because of missing components.: Last  Change: N/A     This score determines the patient's risk of having a stroke if the  patient has atrial fibrillation.  This score is  not applicable to this patient. Components are not  calculated.                  Patient with body aches, chills, nausea, vomiting, posttussive emesis with concern for Covid exposure.  She appears fatigued but no distress and is afebrile with no respiratory distress.  Abdomen is soft and nontender.  Patient will be hydrated gently. Lab work will be obtained on chest x-ray.  She was found to have hypoxia to the mid 80s while registration was speaking with her but denied any shortness of breath. We will obtain chest x-ray including D-dimer.  She will likely need admission given her hypoxia.  Labs are pending at time of shift change.  Lynn Estrada was evaluated in Emergency Department on 06/11/2019 for the symptoms described in the history of present illness. She was evaluated in the context of the global COVID-19 pandemic, which necessitated consideration that the patient might be at risk for infection with the SARS-CoV-2 virus that causes COVID-19. Institutional protocols and algorithms that pertain to the evaluation of patients at risk for COVID-19 are in a state of rapid change based on information released by regulatory bodies including the CDC and federal and state organizations. These policies and algorithms were followed during the patient's care in the ED. All Final Clinical Impression(s) / ED Diagnoses Final diagnoses:  None    Rx / DC Orders ED Discharge Orders    None       Avamarie Crossley, Annie Main, MD 06/11/19 [phone]

## 2019-06-11 NOTE — ED Notes (Signed)
Pure wick has been placed. Suction set to 45mmHg.  

## 2019-06-11 NOTE — H&P (Signed)
History and Physical    Lynn Estrada [id number] DOB: 01/29/1961 DOA: 06/11/2019  PCP: [name], PA  Patient coming from: Home  I have personally briefly reviewed patient's old medical records in [place]  Chief Complaint: Cough, emesis, fatigue  HPI: Lynn Estrada is a 58 y.o. female with medical history significant of HTN.  Patient presents to the ED with ~2 week history of body aches, fatigue, cough, extensive vomiting, chills, and fever up to 100 at home.  She has multiple sick contacts: multiple family members who she saw over thanksgiving have not only similar symptoms but have been formally diagnosed with COVID-19 with positive COVID tests.  She had a COVID test on 12/2, which came back "indeterminate".  Repeat COVID test as outpt yesterday is still pending.   ED Course: Tm 99.7.  Creat 1.26.  D.dimer 1.36.  Though she doesn't complain of SOB, she is satting mid 80s on RA, improves to mid 90s on 3L O2 via [place].  ("Happy hypoxia").  CXR shows multifocal pneumonia that would be C/W COVID.  WBC nl  RVP today is negative for COVID as well as influenza.   Review of Systems: As per HPI, otherwise all review of systems negative.  Past Medical History:  Diagnosis Date  . Anemia   . Eczema   . GERD (gastroesophageal reflux disease)   . History of adenomatous polyp of colon    08-03-2016  tubular adenoma x2 and hyperplastic  . History of chronic gastritis 08/03/2016  . History of ectopic pregnancy 1988   s/p  left salpingectomy  . History of endometriosis   . History of kidney stones   . History of partial nephrectomy    right pelvis for very large stone  . History of pneumonia 07/02/2016   CAP  . History of sepsis    07-01-2016  sepsis w/ pyelonephritis, CAP /   12-31-2016  urosepsis w/ kidney stone obstruction  . History of uterine leiomyoma   . Hypertension   . Left ureteral stone   . Nephrolithiasis    left obstructive stone and  right non-obstructive stone  per CT 12-31-2016  . Urgency of urination     Past Surgical History:  Procedure Laterality Date  . ABDOMINAL HYSTERECTOMY  01/20/2007   w/ Lysis Adhesions/  Left salpingoophorectomy/  Right Salpingectomy  . CHOLECYSTECTOMY  1994   and Right Partial Nephrectomy (pelvis for very large stone)  . CYSTOSCOPY W/ URETERAL STENT PLACEMENT Left 12/31/2016   Procedure: CYSTOSCOPY WITH RETROGRADE PYELOGRAM/ LEFT URETERAL STENT PLACEMENT;  Surgeon: Alexis Frock, MD;  Location: [facility];  Service: Urology;  Laterality: Left;  . CYSTOSCOPY WITH RETROGRADE PYELOGRAM, URETEROSCOPY AND STENT PLACEMENT Left 01/18/2017   Procedure: 1ST STAGE CYSTOSCOPY WITH RETROGRADE PYELOGRAM, URETEROSCOPY AND STENT REPLACEMENT;  Surgeon: Alexis Frock, MD;  Location[facility];  Service: Urology;  Laterality: Left;  . CYSTOSCOPY WITH RETROGRADE PYELOGRAM, URETEROSCOPY AND STENT PLACEMENT Left 02/01/2017   Procedure: 2ND STAGE CYSTOSCOPY WITH RETROGRADE PYELOGRAM, URETEROSCOPY AND STENT REPLACEMENT;  Surgeon: Alexis Frock, MD;  Location: [facility];  Service: Urology;  Laterality: Left;  . ECTOPIC PREGNANCY SURGERY  1988   Left Salpingectomy  . HOLMIUM LASER APPLICATION Left [id number]   Procedure: HOLMIUM LASER APPLICATION;  Surgeon: Alexis Frock, MD;  Location: [facility];  Service: Urology;  Laterality: Left;  . HOLMIUM LASER APPLICATION Left [id number]   Procedure: HOLMIUM LASER APPLICATION;  Surgeon: Alexis Frock, MD;  Location: [facility];  Service: Urology;  Laterality: Left;  . LUMBAR DISC SURGERY  01/1999   right L5 -- S1  . RE-EXPLORATION LUMBAR/  LAMINECTOMY AND MICRODISKECTOMY  10/14/2001   right L5 -- S1  . TUBAL LIGATION Bilateral 10/17/1999   PPTL  . UMBILICAL HERNIA REPAIR  age 62     reports that she quit smoking about 22 years ago. Her smoking use included cigarettes. She has a 3.75 pack-year smoking  history. She has never used smokeless tobacco. She reports current alcohol use. She reports that she does not use drugs.  Allergies  Allergen Reactions  . Bactrim [Sulfamethoxazole-Trimethoprim] Rash  . Latex Rash    Family History  Problem Relation Age of Onset  . Hypertension Sister   . Eczema Sister   . Lupus Sister   . Sarcoidosis Mother   . Prostate cancer Father   . Gout Maternal Grandmother   . Diabetes Maternal Grandmother        leg amputations  . Breast cancer Maternal Aunt   . Stomach cancer Neg Hx   . Colon cancer Neg Hx      Prior to Admission medications   Medication Sig Start Date End Date Taking? Authorizing Provider  amLODipine (NORVASC) 10 MG tablet TAKE 1 TABLET (10 MG TOTAL) BY MOUTH DAILY. 09/05/17  Yes Jeffery, Domingo Mend, PA  Multiple Vitamins-Minerals (MULTIVITAMIN ADULT PO) Take 1 tablet by mouth daily.   Yes [provider]  Vitamin D, Ergocalciferol, (DRISDOL) 1.25 MG (50000 UT) CAPS capsule Take 50,000 Units by mouth once a week. 03/10/19   [provider]    Physical Exam: Vitals:   06/11/19 1830 06/11/19 1900 06/11/19 1930 06/11/19 2000  BP: 129/77 140/82 132/86 135/83  Pulse: 83 77 80 84  Resp: (!) 21 15 16 17   Temp:      TempSrc:      SpO2: 94% 95% 95% 94%    Constitutional: NAD, calm, comfortable Eyes: PERRL, lids and conjunctivae normal ENMT: Mucous membranes are moist. Posterior pharynx clear of any exudate or lesions.Normal dentition.  Neck: normal, supple, no masses, no thyromegaly Respiratory: clear to auscultation bilaterally, no wheezing, no crackles. Normal respiratory effort. No accessory muscle use.  Cardiovascular: Regular rate and rhythm, no murmurs / rubs / gallops. No extremity edema. 2+ pedal pulses. No carotid bruits.  Abdomen: no tenderness, no masses palpated. No hepatosplenomegaly. Bowel sounds positive.  Musculoskeletal: no clubbing / cyanosis. No joint deformity upper and lower extremities. Good ROM, no  contractures. Normal muscle tone.  Skin: no rashes, lesions, ulcers. No induration Neurologic: CN 2-12 grossly intact. Sensation intact, DTR normal. Strength 5/5 in all 4.  Psychiatric: Normal judgment and insight. Alert and oriented x 3. Normal mood.    Labs on Admission: I have personally reviewed following labs and imaging studies  CBC: Recent Labs  Lab 06/11/19 1622  WBC 4.3  HGB 11.9*  HCT 38.5  MCV 89.1  PLT 99991111   Basic Metabolic Panel: Recent Labs  Lab 06/11/19 1622  NA 137  K 4.1  CL 101  CO2 25  GLUCOSE 88  BUN 18  CREATININE 1.26*  CALCIUM 8.7*   GFR: CrCl cannot be calculated (Unknown ideal weight.). Liver Function Tests: Recent Labs  Lab 06/11/19 1622  AST 30  ALT 28  ALKPHOS 66  BILITOT 0.6  PROT 7.3  ALBUMIN 3.6   Recent Labs  Lab 06/11/19 1622  LIPASE 66*   No results for input(s): AMMONIA in the last 168 hours.  Coagulation Profile: No results for input(s): INR, PROTIME in the last 168 hours. Cardiac Enzymes: No results for input(s): CKTOTAL, CKMB, CKMBINDEX, TROPONINI in the last 168 hours. BNP (last 3 results) No results for input(s): PROBNP in the last 8760 hours. HbA1C: No results for input(s): HGBA1C in the last 72 hours. CBG: No results for input(s): GLUCAP in the last 168 hours. Lipid Profile: No results for input(s): CHOL, HDL, LDLCALC, TRIG, CHOLHDL, LDLDIRECT in the last 72 hours. Thyroid Function Tests: No results for input(s): TSH, T4TOTAL, FREET4, T3FREE, THYROIDAB in the last 72 hours. Anemia Panel: No results for input(s): VITAMINB12, FOLATE, FERRITIN, TIBC, IRON, RETICCTPCT in the last 72 hours. Urine analysis:    Component Value Date/Time   COLORURINE YELLOW 12/31/2016 1401   APPEARANCEUR HAZY (A) 12/31/2016 1401   LABSPEC 1.036 (H) 12/31/2016 1401   PHURINE 6.0 12/31/2016 1401   GLUCOSEU NEGATIVE 12/31/2016 1401   HGBUR MODERATE (A) 12/31/2016 1401   BILIRUBINUR NEGATIVE 12/31/2016 1401   BILIRUBINUR negative  12/07/2015 1226   KETONESUR 20 (A) 12/31/2016 1401   PROTEINUR NEGATIVE 12/31/2016 1401   UROBILINOGEN 0.2 12/07/2015 1226   NITRITE NEGATIVE 12/31/2016 1401   LEUKOCYTESUR LARGE (A) 12/31/2016 1401    Radiological Exams on Admission: DG Chest Portable 1 View  Result Date: 06/11/2019 CLINICAL DATA:  Weakness, cough. Additional history provided: COVID (+) EXAM: PORTABLE CHEST 1 VIEW COMPARISON:  Chest radiograph 09/10/2017 FINDINGS: Unchanged cardiomegaly.  Aortic atherosclerosis. Ill-defined opacities within the left mid lung and bilateral lung bases. No pleural effusion or evidence of pneumothorax. No acute bony abnormality. Overlying cardiac monitoring leads. IMPRESSION: Ill-defined airspace opacities within the left mid lung and bilateral lung bases, likely reflecting multifocal pneumonia given provided history. Radiographic follow-up to resolution recommended. Unchanged cardiomegaly.  Aortic atherosclerosis. Electronically Signed   By: Kellie Simmering DO   On: 06/11/2019 16:59    EKG: Independently reviewed.  Assessment/Plan Principal Problem:   Suspected COVID-19 virus infection Active Problems:   Essential hypertension   Acute respiratory failure with hypoxia (HCC)   Multifocal pneumonia    1. Suspected COVID-19 - Acute resp failure with hypoxia, multifocal PNA, N/V, multiple family member contacts with confirmed COVID-19.  Has indeterminate COVID-19 test on 12/2.  Nl WBC. 1. Despite the negative RVP test today for COVID, feel that the greater weight of the evidence strongly favors this being COVID-19 pneumonia.  And dont want to withhold treatment despite this one negative test. 2. Therefore will empirically start treatment for COVID-19 3. COVID pathway 4. Remdesivir per pharm 5. Decadron 6. CRP pending 7. Procalcitonin pending - start ABx if positive. 8. Daily labs 9. O2 via [place] 10. Cont pulse ox 2. HTN - 1. Continue home BP meds  DVT prophylaxis: Lovenox Code Status:  Full Family Communication: No family in room Disposition Plan: Home after admit Consults called: none Admission status: Admit to inpatient  Severity of Illness: The appropriate patient status for this patient is INPATIENT. Inpatient status is judged to be reasonable and necessary in order to provide the required intensity of service to ensure the patient's safety. The patient's presenting symptoms, physical exam findings, and initial radiographic and laboratory data in the context of their chronic comorbidities is felt to place them at high risk for further clinical deterioration. Furthermore, it is not anticipated that the patient will be medically stable for discharge from the hospital within 2 midnights of admission. The following factors support the patient status of inpatient.   IP status due to new  O2 requirement associated with multifocal PNA   * I certify that at the point of admission it is my clinical judgment that the patient will require inpatient hospital care spanning beyond 2 midnights from the point of admission due to high intensity of service, high risk for further deterioration and high frequency of surveillance required.*    Lankford Gutzmer M. DO [facility]  How to contact the [facility] Attending or Consulting provider [place] or covering provider during after hours [place], for this patient?  1. Check the care team in [facility] and look for a) attending/consulting [facility] provider listed and b) the [facility] team listed 2. Log into [email or web address]  Amion Physician Scheduling and messaging for groups and whole hospitals  On call and physician scheduling software for group practices, residents, hospitalists and other medical providers for call, clinic, rotation and shift schedules. On[employer] is a hospital-wide system for scheduling doctors and paging doctors on call. EasyPlot is for scientific plotting and data analysis.  [email or web address]  and use [facility]'s universal password to  access. If you do not have the password, please contact the hospital operator.  3. Locate the [facility] provider you are looking for under [facility] and page to a number that you can be directly reached. 4. If you still have difficulty reaching the provider, please page the [facility] (Director on Call) for the Hospitalists listed on amion for assistance.  06/11/2019, 9:03 PM

## 2019-06-11 NOTE — ED Triage Notes (Signed)
Pt reports that she was around family at Thanksgiving who has since been tested positive for Covid. Was negative her first test and got retested yesterday. For week c/o fatigue, n/v. Hx PNA.

## 2019-06-12 ENCOUNTER — Encounter (HOSPITAL_COMMUNITY): Payer: Self-pay | Admitting: Internal Medicine

## 2019-06-12 LAB — URINALYSIS, ROUTINE W REFLEX MICROSCOPIC
Bilirubin Urine: NEGATIVE
Glucose, UA: NEGATIVE mg/dL
Hgb urine dipstick: NEGATIVE
Ketones, ur: 20 mg/dL — AB
Nitrite: NEGATIVE
Protein, ur: 30 mg/dL — AB
Specific Gravity, Urine: 1.016 (ref 1.005–1.030)
pH: 5 (ref 5.0–8.0)

## 2019-06-12 LAB — CBC WITH DIFFERENTIAL/PLATELET
Abs Immature Granulocytes: 0.01 10*3/uL (ref 0.00–0.07)
Basophils Absolute: 0 10*3/uL (ref 0.0–0.1)
Basophils Relative: 0 %
Eosinophils Absolute: 0 10*3/uL (ref 0.0–0.5)
Eosinophils Relative: 0 %
HCT: 38 % (ref 36.0–46.0)
Hemoglobin: 11.6 g/dL — ABNORMAL LOW (ref 12.0–15.0)
Immature Granulocytes: 0 %
Lymphocytes Relative: 19 %
Lymphs Abs: 0.6 10*3/uL — ABNORMAL LOW (ref 0.7–4.0)
MCH: 27.6 pg (ref 26.0–34.0)
MCHC: 30.5 g/dL (ref 30.0–36.0)
MCV: 90.3 fL (ref 80.0–100.0)
Monocytes Absolute: 0.1 10*3/uL (ref 0.1–1.0)
Monocytes Relative: 3 %
Neutro Abs: 2.7 10*3/uL (ref 1.7–7.7)
Neutrophils Relative %: 78 %
Platelets: 249 10*3/uL (ref 150–400)
RBC: 4.21 MIL/uL (ref 3.87–5.11)
RDW: 14.1 % (ref 11.5–15.5)
WBC: 3.4 10*3/uL — ABNORMAL LOW (ref 4.0–10.5)
nRBC: 0 % (ref 0.0–0.2)

## 2019-06-12 LAB — COMPREHENSIVE METABOLIC PANEL
ALT: 27 U/L (ref 0–44)
AST: 30 U/L (ref 15–41)
Albumin: 3.5 g/dL (ref 3.5–5.0)
Alkaline Phosphatase: 61 U/L (ref 38–126)
Anion gap: 12 (ref 5–15)
BUN: 17 mg/dL (ref 6–20)
CO2: 20 mmol/L — ABNORMAL LOW (ref 22–32)
Calcium: 8.4 mg/dL — ABNORMAL LOW (ref 8.9–10.3)
Chloride: 107 mmol/L (ref 98–111)
Creatinine, Ser: 0.89 mg/dL (ref 0.44–1.00)
GFR calc Af Amer: >60 mL/min (ref >60)
GFR calc non Af Amer: >60 mL/min (ref >60)
Glucose, Bld: 111 mg/dL — ABNORMAL HIGH (ref 70–99)
Potassium: 4.3 mmol/L (ref 3.5–5.1)
Sodium: 139 mmol/L (ref 135–145)
Total Bilirubin: 0.5 mg/dL (ref 0.3–1.2)
Total Protein: 7.6 g/dL (ref 6.5–8.1)

## 2019-06-12 LAB — HIV ANTIBODY (ROUTINE TESTING W REFLEX): HIV Screen 4th Generation wRfx: NONREACTIVE

## 2019-06-12 LAB — D-DIMER, QUANTITATIVE: D-Dimer, Quant: 1.63 ug/mL-FEU — ABNORMAL HIGH (ref 0.00–0.50)

## 2019-06-12 LAB — C-REACTIVE PROTEIN: CRP: 2.3 mg/dL — ABNORMAL HIGH (ref <1.0)

## 2019-06-12 MED ORDER — SODIUM CHLORIDE 0.9 % IV SOLN
1.0000 g | INTRAVENOUS | Status: DC
  Administered 2019-06-12 – 2019-06-16 (×5): 1 g via INTRAVENOUS
  Filled 2019-06-12 (×5): qty 1

## 2019-06-12 MED ORDER — SODIUM CHLORIDE 0.9 % IV SOLN
500.0000 mg | INTRAVENOUS | Status: AC
  Administered 2019-06-12 – 2019-06-16 (×5): 500 mg via INTRAVENOUS
  Filled 2019-06-12 (×5): qty 500

## 2019-06-12 NOTE — ED Notes (Signed)
ED TO INPATIENT HANDOFF REPORT  ED Nurse Name and Phone #: [phone]  S Name/Age/Gender Lynn Estrada 58 y.o. female Room/Bed: WA12/WA12  Code Status   Code Status: Full Code  Home/SNF/Other Home Patient oriented to: self Is this baseline? Yes   Triage Complete: Triage complete  Chief Complaint Suspected COVID-19 virus infection [Z20.828]  Triage Note Pt reports that she was around family at Thanksgiving who has since been tested positive for Covid. Was negative her first test and got retested yesterday. For week c/o fatigue, n/v. Hx PNA.     Allergies Allergies  Allergen Reactions  . Bactrim [Sulfamethoxazole-Trimethoprim] Rash  . Latex Rash    Level of Care/Admitting Diagnosis ED Disposition    ED Disposition Condition Comment   Admit  Hospital Area: [facility] [[id number]]  Level of Care: Med-Surg [16]  Covid Evaluation: Symptomatic Person Under Investigation (PUI)  Diagnosis: Suspected COVID-19 virus infection [[id number]]  Admitting Physician: Doreatha Massed  Attending Physician: Etta Quill [phone]  Estimated length of stay: past midnight tomorrow  Certification:: I certify this patient will need inpatient services for at least 2 midnights       B Medical/Surgery History Past Medical History:  Diagnosis Date  . Anemia   . Eczema   . GERD (gastroesophageal reflux disease)   . History of adenomatous polyp of colon    08-03-2016  tubular adenoma x2 and hyperplastic  . History of chronic gastritis 08/03/2016  . History of ectopic pregnancy 1988   s/p  left salpingectomy  . History of endometriosis   . History of kidney stones   . History of partial nephrectomy    right pelvis for very large stone  . History of pneumonia 07/02/2016   CAP  . History of sepsis    07-01-2016  sepsis w/ pyelonephritis, CAP /   12-31-2016  urosepsis w/ kidney stone obstruction  . History of uterine leiomyoma   . Hypertension   . Left  ureteral stone   . Nephrolithiasis    left obstructive stone and right non-obstructive stone  per CT 12-31-2016  . Urgency of urination    Past Surgical History:  Procedure Laterality Date  . ABDOMINAL HYSTERECTOMY  01/20/2007   w/ Lysis Adhesions/  Left salpingoophorectomy/  Right Salpingectomy  . CHOLECYSTECTOMY  1994   and Right Partial Nephrectomy (pelvis for very large stone)  . CYSTOSCOPY W/ URETERAL STENT PLACEMENT Left 12/31/2016   Procedure: CYSTOSCOPY WITH RETROGRADE PYELOGRAM/ LEFT URETERAL STENT PLACEMENT;  Surgeon: Alexis Frock, MD;  Location: [facility];  Service: Urology;  Laterality: Left;  . CYSTOSCOPY WITH RETROGRADE PYELOGRAM, URETEROSCOPY AND STENT PLACEMENT Left 01/18/2017   Procedure: 1ST STAGE CYSTOSCOPY WITH RETROGRADE PYELOGRAM, URETEROSCOPY AND STENT REPLACEMENT;  Surgeon: Alexis Frock, MD;  Location: [facility];  Service: Urology;  Laterality: Left;  . CYSTOSCOPY WITH RETROGRADE PYELOGRAM, URETEROSCOPY AND STENT PLACEMENT Left 02/01/2017   Procedure: 2ND STAGE CYSTOSCOPY WITH RETROGRADE PYELOGRAM, URETEROSCOPY AND STENT REPLACEMENT;  Surgeon: Alexis Frock, MD;  Location: [facility];  Service: Urology;  Laterality: Left;  . ECTOPIC PREGNANCY SURGERY  1988   Left Salpingectomy  . HOLMIUM LASER APPLICATION Left 07/04/7251   Procedure: HOLMIUM LASER APPLICATION;  Surgeon: Alexis Frock, MD;  Location: [facility];  Service: Urology;  Laterality: Left;  . HOLMIUM LASER APPLICATION Left 12/05/4401   Procedure: HOLMIUM LASER APPLICATION;  Surgeon: Alexis Frock, MD;  Location: [facility];  Service: Urology;  Laterality: Left;  .  LUMBAR DISC SURGERY  01/1999   right L5 -- S1  . RE-EXPLORATION LUMBAR/  LAMINECTOMY AND MICRODISKECTOMY  10/14/2001   right L5 -- S1  . TUBAL LIGATION Bilateral 10/17/1999   PPTL  . UMBILICAL HERNIA REPAIR  age 65     A IV Location/Drains/Wounds Patient Lines/Drains/Airways  Status   Active Line/Drains/Airways    Name:   Placement date:   Placement time:   Site:   Days:   Peripheral IV 06/11/19 Right Antecubital   06/11/19    -    Antecubital   1   Ureteral Drain/Stent Left ureter 6 Fr.   02/01/17    0916    Left ureter   861   Incision (Closed) 01/18/17 Vagina Other (Comment)   01/18/17    1335     875   Incision (Closed) 02/01/17 Vagina Other (Comment)   02/01/17    0919     861          Intake/Output Last 24 hours No intake or output data in the 24 hours ending 06/12/19 0026  Labs/Imaging Results for orders placed or performed during the hospital encounter of 06/11/19 (from the past 48 hour(s))  Lipase, blood     Status: Abnormal   Collection Time: 06/11/19  4:22 PM  Result Value Ref Range   Lipase 66 (H) 11 - 51 U/L    Comment: Performed at [facility], [place] [address], [place], [place] [address]  Comprehensive metabolic panel     Status: Abnormal   Collection Time: 06/11/19  4:22 PM  Result Value Ref Range   Sodium 137 135 - 145 mmol/L   Potassium 4.1 3.5 - 5.1 mmol/L   Chloride 101 98 - 111 mmol/L   CO2 25 22 - 32 mmol/L   Glucose, Bld 88 70 - 99 mg/dL   BUN 18 6 - 20 mg/dL   Creatinine, Ser 1.26 (H) 0.44 - 1.00 mg/dL   Calcium 8.7 (L) 8.9 - 10.3 mg/dL   Total Protein 7.3 6.5 - 8.1 g/dL   Albumin 3.6 3.5 - 5.0 g/dL   AST 30 15 - 41 U/L   ALT 28 0 - 44 U/L   Alkaline Phosphatase 66 38 - 126 U/L   Total Bilirubin 0.6 0.3 - 1.2 mg/dL   GFR calc non Af Amer 47 (L) >60 mL/min   GFR calc Af Amer 55 (L) >60 mL/min   Anion gap 11 5 - 15    Comment: Performed at [facility], [place] [address] [place], [place] [address]  CBC     Status: Abnormal   Collection Time: 06/11/19  4:22 PM  Result Value Ref Range   WBC 4.3 4.0 - 10.5 K/uL   RBC 4.32 3.87 - 5.11 MIL/uL   Hemoglobin 11.9 (L) 12.0 - 15.0 g/dL   HCT 38.5 36.0 - 46.0 %   MCV 89.1 80.0 - 100.0 fL   MCH 27.5 26.0 - 34.0 pg   MCHC 30.9 30.0 - 36.0 g/dL    RDW 14.0 11.5 - 15.5 %   Platelets 257 150 - 400 K/uL   nRBC 0.0 0.0 - 0.2 %    Comment: Performed at [facility], [place] [address], [place], [place] [address]  D-dimer, quantitative (not at [facility])     Status: Abnormal   Collection Time: 06/11/19  4:22 PM  Result Value Ref Range   D-Dimer, Quant 1.36 (H) 0.00 - 0.50 ug/mL-FEU    Comment: (  NOTE) At the manufacturer cut-off of 0.50 ug/mL FEU, this assay has been documented to exclude PE with a sensitivity and negative predictive value of 97 to 99%.  At this time, this assay has not been approved by the FDA to exclude DVT/VTE. Results should be correlated with clinical presentation. Performed at [facility], [place] [address] [place], [place] [address]   ABO/Rh     Status: None   Collection Time: 06/11/19  4:22 PM  Result Value Ref Range   ABO/RH(D)      B POS Performed at [facility], [place] [address] [place], [place] [address]   Troponin I (High Sensitivity)     Status: Abnormal   Collection Time: 06/11/19  4:23 PM  Result Value Ref Range   Troponin I (High Sensitivity) 23 (H) <18 ng/L    Comment: (NOTE) Elevated high sensitivity troponin I (hsTnI) values and significant  changes across serial measurements may suggest ACS but many other  chronic and acute conditions are known to elevate hsTnI results.  Refer to the "Links" section for chest pain algorithms and additional  guidance. Performed at [facility], [place] [address], [place], [place] [address]   POC SARS Coronavirus 2 Ag-ED - Nasal Swab (BD Veritor Kit)     Status: None   Collection Time: 06/11/19  4:49 PM  Result Value Ref Range   SARS Coronavirus 2 Ag NEGATIVE NEGATIVE    Comment: (NOTE) SARS-CoV-2 antigen NOT DETECTED.  Negative results are presumptive.  Negative results do not preclude SARS-CoV-2 infection and should not be used as the sole basis for treatment or other patient management  decisions, including infection  control decisions, particularly in the presence of clinical signs and  symptoms consistent with COVID-19, or in those who have been in contact with the virus.  Negative results must be combined with clinical observations, patient history, and epidemiological information. The expected result is Negative. Fact Sheet for Patients: PodPark.tn Fact Sheet for Healthcare Providers: GiftContent.is This test is not yet approved or cleared by the [place] FDA and  has been authorized for detection and/or diagnosis of SARS-CoV-2 by FDA under an Emergency Use Authorization (EUA).  This EUA will remain in effect (meaning this test can be used) for the duration of  the COVID-19 de claration under Section 564(b)(1) of the Act, 21 U.S.C. section 360bbb-3(b)(1), unless the authorization is terminated or revoked sooner.   Respiratory Panel by RT PCR (Flu A&B, Covid) - Nasopharyngeal Swab     Status: None   Collection Time: 06/11/19  5:36 PM   Specimen: Nasopharyngeal Swab  Result Value Ref Range   SARS Coronavirus 2 by RT PCR NEGATIVE NEGATIVE    Comment: (NOTE) SARS-CoV-2 target nucleic acids are NOT DETECTED. The SARS-CoV-2 RNA is generally detectable in upper respiratoy specimens during the acute phase of infection. The lowest concentration of SARS-CoV-2 viral copies this assay can detect is 131 copies/mL. A negative result does not preclude SARS-Cov-2 infection and should not be used as the sole basis for treatment or other patient management decisions. A negative result may occur with  improper specimen collection/handling, submission of specimen other than nasopharyngeal swab, presence of viral mutation(s) within the areas targeted by this assay, and inadequate number of viral copies (<131 copies/mL). A negative result must be combined with clinical observations, patient history, and epidemiological  information. The expected result is Negative. Fact Sheet for Patients:  PinkCheek.be Fact Sheet for Healthcare Providers:  GravelBags.it This  test is not yet ap proved or cleared by the [place] and  has been authorized for detection and/or diagnosis of SARS-CoV-2 by FDA under an Emergency Use Authorization (EUA). This EUA will remain  in effect (meaning this test can be used) for the duration of the COVID-19 declaration under Section 564(b)(1) of the Act, 21 U.S.C. section 360bbb-3(b)(1), unless the authorization is terminated or revoked sooner.    Influenza A by PCR NEGATIVE NEGATIVE   Influenza B by PCR NEGATIVE NEGATIVE    Comment: (NOTE) The Xpert Xpress SARS-CoV-2/FLU/RSV assay is intended as an aid in  the diagnosis of influenza from Nasopharyngeal swab specimens and  should not be used as a sole basis for treatment. Nasal washings and  aspirates are unacceptable for Xpert Xpress SARS-CoV-2/FLU/RSV  testing. Fact Sheet for Patients: PinkCheek.be Fact Sheet for Healthcare Providers: GravelBags.it This test is not yet approved or cleared by the [place] FDA and  has been authorized for detection and/or diagnosis of SARS-CoV-2 by  FDA under an Emergency Use Authorization (EUA). This EUA will remain  in effect (meaning this test can be used) for the duration of the  Covid-19 declaration under Section 564(b)(1) of the Act, 21  U.S.C. section 360bbb-3(b)(1), unless the authorization is  terminated or revoked. Performed at [facility], [place] [address], [place], [place] [address]   C-reactive protein     Status: Abnormal   Collection Time: 06/11/19  7:45 PM  Result Value Ref Range   CRP 2.2 (H) <1.0 mg/dL    Comment: Performed at [facility], [place] [address], [place], [place] [address]  Troponin I (High  Sensitivity)     Status: Abnormal   Collection Time: 06/11/19  8:30 PM  Result Value Ref Range   Troponin I (High Sensitivity) 25 (H) <18 ng/L    Comment: (NOTE) Elevated high sensitivity troponin I (hsTnI) values and significant  changes across serial measurements may suggest ACS but many other  chronic and acute conditions are known to elevate hsTnI results.  Refer to the "Links" section for chest pain algorithms and additional  guidance. Performed at [facility], [place] [address] [place], [place] [address]   HIV Antibody (routine testing w rflx)     Status: None   Collection Time: 06/11/19  8:31 PM  Result Value Ref Range   HIV Screen 4th Generation wRfx NON REACTIVE NON REACTIVE    Comment: Performed at [facility], [place] [address] [place], [place] [address]  Procalcitonin     Status: None   Collection Time: 06/11/19  8:31 PM  Result Value Ref Range   Procalcitonin <0.10 ng/mL    Comment:        Interpretation: PCT (Procalcitonin) <= 0.5 ng/mL: Systemic infection (sepsis) is not likely. Local bacterial infection is possible. (NOTE)       Sepsis PCT Algorithm           Lower Respiratory Tract                                      Infection PCT Algorithm    ----------------------------     ----------------------------         PCT < 0.25 ng/mL                PCT < 0.10 ng/mL         Strongly  encourage             Strongly discourage   discontinuation of antibiotics    initiation of antibiotics    ----------------------------     -----------------------------       PCT 0.25 - 0.50 ng/mL            PCT 0.10 - 0.25 ng/mL               OR       >80% decrease in PCT            Discourage initiation of                                            antibiotics      Encourage discontinuation           of antibiotics    ----------------------------     -----------------------------         PCT >= 0.50 ng/mL              PCT 0.26 - 0.50 ng/mL                AND        <80% decrease in PCT             Encourage initiation of                                             antibiotics       Encourage continuation           of antibiotics    ----------------------------     -----------------------------        PCT >= 0.50 ng/mL                  PCT > 0.50 ng/mL               AND         increase in PCT                  Strongly encourage                                      initiation of antibiotics    Strongly encourage escalation           of antibiotics                                     -----------------------------                                           PCT <= 0.25 ng/mL                                                 OR                                        >  80% decrease in PCT                                     Discontinue / Do not initiate                                             antibiotics Performed at [place] [address] [place], [place] [address]    DG Chest Portable 1 View  Result Date: 06/11/2019 CLINICAL DATA:  Weakness, cough. Additional history provided: COVID (+) EXAM: PORTABLE CHEST 1 VIEW COMPARISON:  Chest radiograph 09/10/2017 FINDINGS: Unchanged cardiomegaly.  Aortic atherosclerosis. Ill-defined opacities within the left mid lung and bilateral lung bases. No pleural effusion or evidence of pneumothorax. No acute bony abnormality. Overlying cardiac monitoring leads. IMPRESSION: Ill-defined airspace opacities within the left mid lung and bilateral lung bases, likely reflecting multifocal pneumonia given provided history. Radiographic follow-up to resolution recommended. Unchanged cardiomegaly.  Aortic atherosclerosis. Electronically Signed   By: Kellie Simmering DO   On: 06/11/2019 16:59    Pending Labs Unresulted Labs (From admission, onward)    Start     Ordered   06/12/19 0500  CBC with Differential/Platelet  Daily,   R     06/11/19 1948   06/12/19 0500  Comprehensive metabolic panel   Daily,   R     06/11/19 1948   06/12/19 0500  C-reactive protein  Daily,   R     06/11/19 1948   06/12/19 0500  D-dimer, quantitative (not at [facility])  Daily,   R     06/11/19 1948   06/11/19 1314  Urinalysis, Routine w reflex microscopic  ONCE - STAT,   STAT     06/11/19 1314          Vitals/Pain Today's Vitals   06/11/19 2300 06/11/19 2315 06/11/19 2330 06/12/19 0000  BP: 128/80  137/83 132/82  Pulse: 85 84 85 85  Resp: (!) 24 (!) 23 (!) 25 20  Temp:      TempSrc:      SpO2: (!) 89% 90% (!) 84% 90%    Isolation Precautions Airborne and Contact precautions  Medications Medications  enoxaparin (LOVENOX) injection 40 mg (has no administration in time range)  dexamethasone (DECADRON) tablet 6 mg (6 mg Oral Given 06/11/19 2021)  albuterol (VENTOLIN HFA) 108 (90 Base) MCG/ACT inhaler 2 puff (2 puffs Inhalation Given 06/11/19 2023)  guaiFENesin-dextromethorphan (ROBITUSSIN DM) 100-10 MG/5ML syrup 10 mL (has no administration in time range)  chlorpheniramine-HYDROcodone (TUSSIONEX) 10-8 MG/5ML suspension 5 mL (has no administration in time range)  acetaminophen (TYLENOL) tablet 650 mg (has no administration in time range)  ondansetron (ZOFRAN) tablet 4 mg (has no administration in time range)    Or  ondansetron (ZOFRAN) injection 4 mg (has no administration in time range)  remdesivir 200 mg in sodium chloride 0.9% 250 mL IVPB (0 mg Intravenous Stopped 06/11/19 2208)    Followed by  remdesivir 100 mg in sodium chloride 0.9 % 100 mL IVPB (has no administration in time range)  amLODipine (NORVASC) tablet 10 mg (has no administration in time range)  sodium chloride 0.9 % bolus 1,000 mL (0 mLs Intravenous Stopped 06/11/19 2107)  ondansetron (ZOFRAN) injection 4 mg (4 mg Intravenous Given 06/11/19 1623)    Mobility walks Low  fall risk   Focused Assessments Pulmonary Assessment Handoff:  Lung sounds:   O2 Device: Nasal Cannula O2 Flow Rate (L/min): 2  L/min      R Recommendations: See Admitting Provider Note  Report given to:   Additional Notes: COVID negative but treating symptoms

## 2019-06-12 NOTE — Progress Notes (Signed)
PROGRESS NOTE  Lynn Estrada [id number] DOB: 03-09-1961 DOA: 06/11/2019 PCP: [name], PA  HPI/Recap of past 24 hours: HPI from Dr Lynn Estrada is a 58 y.o. female with medical history significant of HTN, presents to the ED with ~2 week history of body aches, fatigue, cough, extensive vomiting, chills, and fever up to 100 at home. Pt has multiple sick contacts: multiple family members who she saw over thanksgiving have not only similar symptoms but have been formally diagnosed with COVID-19. She had a COVID test on 12/2, which came back "indeterminate". RVP today is negative for COVID as well as influenza. In the ED, Tm 99.7.  Creat 1.26.  D.dimer 1.36, satting mid 80s on RA, improves to mid 90s on 3L O2 via [place]. CXR shows multifocal pneumonia that would be C/W COVID.  Patient admitted for further management.    Today, patient reports coughing spells, denies any worsening shortness of breath, abdominal pain, chest pain, nausea/vomiting/diarrhea, fever/chills.    Assessment/Plan: Principal Problem:   Suspected COVID-19 virus infection Active Problems:   Essential hypertension   Acute respiratory failure with hypoxia (HCC)   Multifocal pneumonia  CAP vs suspected Covid 19 virus pneumonia Currently afebrile, saturating around 95% on room air (initially was on 2 L O2) Inflammatory markers slightly elevated, procalcitonin negative, D-dimer 1.63 Chest x-ray showed multifocal pneumonia COVID-19 test x2 negative Will manage as Covid in addition to CAP Continue remdesivir, Decadron Start azithromycin, ceftriaxone Supplemental oxygen as needed, cough suppressant, vitamins Trend inflammatory markers Monitor closely  Hypertension Continue amlodipine        Malnutrition Type:      Malnutrition Characteristics:      Nutrition Interventions:       Estimated body mass index is 29.02 kg/m as calculated from the following:   Height as of this  encounter: 6\' 2"  (1.88 m).   Weight as of this encounter: 102.5 kg.     Code Status: Full  Family Communication: None at bedside  Disposition Plan: To be determined   Consultants:  None  Procedures:  None  Antimicrobials:  Ceftriaxone  Azithromycin  DVT prophylaxis: Lovenox   Objective: Vitals:   06/12/19 0112 06/12/19 0125 06/12/19 0135 06/12/19 0410  BP: 126/74   126/85  Pulse: 82   73  Resp: 20   20  Temp: 98.8 F (37.1 C)   98.4 F (36.9 C)  TempSrc: Oral   Oral  SpO2: 94%   98%  Weight:   102.5 kg   Height:  6\' 2"  (1.88 m)      Intake/Output Summary (Last 24 hours) at 06/12/2019 1231 Last data filed at 06/12/2019 0100 Gross per 24 hour  Intake --  Output 750 ml  Net -750 ml   Filed Weights   06/12/19 0135  Weight: 102.5 kg    Exam:  General: NAD   Cardiovascular: S1, S2 present  Respiratory: CTAB  Abdomen: Soft, nontender, nondistended, bowel sounds present  Musculoskeletal: No bilateral pedal edema noted  Skin: Normal  Psychiatry: Normal mood   Data Reviewed: CBC: Recent Labs  Lab 06/11/19 1622 06/12/19 0249  WBC 4.3 3.4*  NEUTROABS  --  2.7  HGB 11.9* 11.6*  HCT 38.5 38.0  MCV 89.1 90.3  PLT 257 0000000   Basic Metabolic Panel: Recent Labs  Lab 06/11/19 1622 06/12/19 0249  NA 137 139  K 4.1 4.3  CL 101 107  CO2 25 20*  GLUCOSE 88 111*  BUN 18 17  CREATININE  1.26* 0.89  CALCIUM 8.7* 8.4*   GFR: Estimated Creatinine Clearance: 96.4 mL/min (by C-G formula based on SCr of 0.89 mg/dL). Liver Function Tests: Recent Labs  Lab 06/11/19 1622 06/12/19 0249  AST 30 30  ALT 28 27  ALKPHOS 66 61  BILITOT 0.6 0.5  PROT 7.3 7.6  ALBUMIN 3.6 3.5   Recent Labs  Lab 06/11/19 1622  LIPASE 66*   No results for input(s): AMMONIA in the last 168 hours. Coagulation Profile: No results for input(s): INR, PROTIME in the last 168 hours. Cardiac Enzymes: No results for input(s): CKTOTAL, CKMB, CKMBINDEX, TROPONINI in  the last 168 hours. BNP (last 3 results) No results for input(s): PROBNP in the last 8760 hours. HbA1C: No results for input(s): HGBA1C in the last 72 hours. CBG: No results for input(s): GLUCAP in the last 168 hours. Lipid Profile: No results for input(s): CHOL, HDL, LDLCALC, TRIG, CHOLHDL, LDLDIRECT in the last 72 hours. Thyroid Function Tests: No results for input(s): TSH, T4TOTAL, FREET4, T3FREE, THYROIDAB in the last 72 hours. Anemia Panel: No results for input(s): VITAMINB12, FOLATE, FERRITIN, TIBC, IRON, RETICCTPCT in the last 72 hours. Urine analysis:    Component Value Date/Time   COLORURINE YELLOW 06/11/2019 1314   APPEARANCEUR HAZY (A) 06/11/2019 1314   LABSPEC 1.016 06/11/2019 1314   PHURINE 5.0 06/11/2019 1314   GLUCOSEU NEGATIVE 06/11/2019 1314   HGBUR NEGATIVE 06/11/2019 1314   BILIRUBINUR NEGATIVE 06/11/2019 1314   BILIRUBINUR negative 12/07/2015 1226   KETONESUR 20 (A) 06/11/2019 1314   PROTEINUR 30 (A) 06/11/2019 1314   UROBILINOGEN 0.2 12/07/2015 1226   NITRITE NEGATIVE 06/11/2019 1314   LEUKOCYTESUR TRACE (A) 06/11/2019 1314   Sepsis Labs: @LABRCNTIP (procalcitonin:4,lacticidven:4)  ) Recent Results (from the past 240 hour(s))  Novel Coronavirus, NAA ([facility])     Status: Abnormal   Collection Time: 06/03/19 12:00 AM   Specimen: Nasopharyngeal(NP) swabs in vial transport medium   NASOPHARYNGE  TESTING  Result Value Ref Range Status   SARS-CoV-2, NAA Comment (A) Not Detected Final    Comment: Indeterminate We are UNABLE to reliably determine a result for the specimen due to the inconsistent amplification of all of the required SARS-CoV-2 components from the specimen submitted. If clinically indicated, please recollect an additional specimen for testing. This nucleic acid amplification test was developed and its performance characteristics determined by [employer]. Nucleic acid amplification tests include PCR and TMA. This test has not been  FDA cleared or approved. This test has been authorized by FDA under an Emergency Use Authorization (EUA). This test is only authorized for the duration of time the declaration that circumstances exist justifying the authorization of the emergency use of in vitro diagnostic tests for detection of SARS-CoV-2 virus and/or diagnosis of COVID-19 infection under section 564(b)(1) of the Act, 21 U.S.C. [id number]) (1), unless the authorization is terminated or revoked sooner. When diagnostic testing is negative, the pos sibility of a false negative result should be considered in the context of a patient's recent exposures and the presence of clinical signs and symptoms consistent with COVID-19. An individual without symptoms of COVID-19 and who is not shedding SARS-CoV-2 virus would expect to have a negative (not detected) result in this assay.   Respiratory Panel by RT PCR (Flu A&B, Covid) - Nasopharyngeal Swab     Status: None   Collection Time: 06/11/19  5:36 PM   Specimen: Nasopharyngeal Swab  Result Value Ref Range Status   SARS Coronavirus 2 by RT PCR NEGATIVE NEGATIVE Final  Comment: (NOTE) SARS-CoV-2 target nucleic acids are NOT DETECTED. The SARS-CoV-2 RNA is generally detectable in upper respiratoy specimens during the acute phase of infection. The lowest concentration of SARS-CoV-2 viral copies this assay can detect is 131 copies/mL. A negative result does not preclude SARS-Cov-2 infection and should not be used as the sole basis for treatment or other patient management decisions. A negative result may occur with  improper specimen collection/handling, submission of specimen other than nasopharyngeal swab, presence of viral mutation(s) within the areas targeted by this assay, and inadequate number of viral copies (<131 copies/mL). A negative result must be combined with clinical observations, patient history, and epidemiological information. The expected result is  Negative. Fact Sheet for Patients:  PinkCheek.be Fact Sheet for Healthcare Providers:  GravelBags.it This test is not yet ap proved or cleared by the [place] FDA and  has been authorized for detection and/or diagnosis of SARS-CoV-2 by FDA under an Emergency Use Authorization (EUA). This EUA will remain  in effect (meaning this test can be used) for the duration of the COVID-19 declaration under Section 564(b)(1) of the Act, 21 U.S.C. section 360bbb-3(b)(1), unless the authorization is terminated or revoked sooner.    Influenza A by PCR NEGATIVE NEGATIVE Final   Influenza B by PCR NEGATIVE NEGATIVE Final    Comment: (NOTE) The Xpert Xpress SARS-CoV-2/FLU/RSV assay is intended as an aid in  the diagnosis of influenza from Nasopharyngeal swab specimens and  should not be used as a sole basis for treatment. Nasal washings and  aspirates are unacceptable for Xpert Xpress SARS-CoV-2/FLU/RSV  testing. Fact Sheet for Patients: PinkCheek.be Fact Sheet for Healthcare Providers: GravelBags.it This test is not yet approved or cleared by the [place] FDA and  has been authorized for detection and/or diagnosis of SARS-CoV-2 by  FDA under an Emergency Use Authorization (EUA). This EUA will remain  in effect (meaning this test can be used) for the duration of the  Covid-19 declaration under Section 564(b)(1) of the Act, 21  U.S.C. section 360bbb-3(b)(1), unless the authorization is  terminated or revoked. Performed at [facility], [place] [address], [place], [place] [address]       Studies: DG Chest Portable 1 View  Result Date: 06/11/2019 CLINICAL DATA:  Weakness, cough. Additional history provided: COVID (+) EXAM: PORTABLE CHEST 1 VIEW COMPARISON:  Chest radiograph 09/10/2017 FINDINGS: Unchanged cardiomegaly.  Aortic atherosclerosis. Ill-defined  opacities within the left mid lung and bilateral lung bases. No pleural effusion or evidence of pneumothorax. No acute bony abnormality. Overlying cardiac monitoring leads. IMPRESSION: Ill-defined airspace opacities within the left mid lung and bilateral lung bases, likely reflecting multifocal pneumonia given provided history. Radiographic follow-up to resolution recommended. Unchanged cardiomegaly.  Aortic atherosclerosis. Electronically Signed   By: Kellie Simmering DO   On: 06/11/2019 16:59    Scheduled Meds: . albuterol  2 puff Inhalation Q6H  . amLODipine  10 mg Oral Daily  . dexamethasone  6 mg Oral Q24H  . enoxaparin (LOVENOX) injection  40 mg Subcutaneous Q24H    Continuous Infusions: . remdesivir 100 mg in NS 100 mL 100 mg (06/12/19 1044)     LOS: 1 day     Alma Friendly, MD [facility]  If 7PM-7AM, please contact night-coverage [email or web address] 06/12/2019, 12:31 PM

## 2019-06-13 LAB — COMPREHENSIVE METABOLIC PANEL
ALT: 25 U/L (ref 0–44)
AST: 27 U/L (ref 15–41)
Albumin: 3.4 g/dL — ABNORMAL LOW (ref 3.5–5.0)
Alkaline Phosphatase: 51 U/L (ref 38–126)
Anion gap: 10 (ref 5–15)
BUN: 25 mg/dL — ABNORMAL HIGH (ref 6–20)
CO2: 25 mmol/L (ref 22–32)
Calcium: 8.5 mg/dL — ABNORMAL LOW (ref 8.9–10.3)
Chloride: 103 mmol/L (ref 98–111)
Creatinine, Ser: 1.13 mg/dL — ABNORMAL HIGH (ref 0.44–1.00)
GFR calc Af Amer: >60 mL/min (ref >60)
GFR calc non Af Amer: 54 mL/min — ABNORMAL LOW (ref >60)
Glucose, Bld: 148 mg/dL — ABNORMAL HIGH (ref 70–99)
Potassium: 5 mmol/L (ref 3.5–5.1)
Sodium: 138 mmol/L (ref 135–145)
Total Bilirubin: 0.6 mg/dL (ref 0.3–1.2)
Total Protein: 7.3 g/dL (ref 6.5–8.1)

## 2019-06-13 LAB — CBC WITH DIFFERENTIAL/PLATELET
Abs Immature Granulocytes: 0.01 10*3/uL (ref 0.00–0.07)
Basophils Absolute: 0 10*3/uL (ref 0.0–0.1)
Basophils Relative: 0 %
Eosinophils Absolute: 0 10*3/uL (ref 0.0–0.5)
Eosinophils Relative: 0 %
HCT: 38.2 % (ref 36.0–46.0)
Hemoglobin: 11.5 g/dL — ABNORMAL LOW (ref 12.0–15.0)
Immature Granulocytes: 0 %
Lymphocytes Relative: 24 %
Lymphs Abs: 1.1 10*3/uL (ref 0.7–4.0)
MCH: 27.5 pg (ref 26.0–34.0)
MCHC: 30.1 g/dL (ref 30.0–36.0)
MCV: 91.4 fL (ref 80.0–100.0)
Monocytes Absolute: 0.2 10*3/uL (ref 0.1–1.0)
Monocytes Relative: 4 %
Neutro Abs: 3.3 10*3/uL (ref 1.7–7.7)
Neutrophils Relative %: 72 %
Platelets: 231 10*3/uL (ref 150–400)
RBC: 4.18 MIL/uL (ref 3.87–5.11)
RDW: 14.1 % (ref 11.5–15.5)
WBC: 4.6 10*3/uL (ref 4.0–10.5)
nRBC: 0 % (ref 0.0–0.2)

## 2019-06-13 LAB — D-DIMER, QUANTITATIVE: D-Dimer, Quant: 1.19 ug/mL-FEU — ABNORMAL HIGH (ref 0.00–0.50)

## 2019-06-13 LAB — C-REACTIVE PROTEIN: CRP: 1 mg/dL — ABNORMAL HIGH (ref <1.0)

## 2019-06-13 MED ORDER — DIPHENHYDRAMINE HCL 25 MG PO CAPS
ORAL_CAPSULE | ORAL | Status: AC
  Filled 2019-06-13: qty 1

## 2019-06-13 MED ORDER — DIPHENHYDRAMINE HCL 25 MG PO CAPS
25.0000 mg | ORAL_CAPSULE | Freq: Every evening | ORAL | Status: DC | PRN
  Administered 2019-06-13: 25 mg via ORAL
  Filled 2019-06-13 (×2): qty 1

## 2019-06-13 MED ORDER — DIPHENHYDRAMINE HCL 25 MG PO CAPS
25.0000 mg | ORAL_CAPSULE | Freq: Once | ORAL | Status: AC
  Administered 2019-06-13: 25 mg via ORAL

## 2019-06-13 NOTE — Progress Notes (Signed)
PROGRESS NOTE  Lynn Estrada [id number] DOB: 1960/11/03 DOA: 06/11/2019 PCP: [name], PA  HPI/Recap of past 24 hours: HPI from Dr Lynn Estrada is a 58 y.o. female with medical history significant of HTN, presents to the ED with ~2 week history of body aches, fatigue, cough, extensive vomiting, chills, and fever up to 100 at home. Pt has multiple sick contacts: multiple family members who she saw over thanksgiving have not only similar symptoms but have been formally diagnosed with COVID-19. She had a COVID test on 12/2, which came back "indeterminate". RVP today is negative for COVID as well as influenza. In the ED, Tm 99.7.  Creat 1.26.  D.dimer 1.36, satting mid 80s on RA, improves to mid 90s on 3L O2 via [place]. CXR shows multifocal pneumonia that would be C/W COVID.  Patient admitted for further management.     Today, patient still coughing, requiring 2 L of oxygen overnight.  No further fevers/chills, chest pain, abdominal pain, nausea/vomiting.    Assessment/Plan: Principal Problem:   Suspected COVID-19 virus infection Active Problems:   Essential hypertension   Acute respiratory failure with hypoxia (HCC)   Multifocal pneumonia  CAP vs suspected Covid 19 virus pneumonia Currently afebrile, no leukocytosis Required about 2 L of oxygen overnight, plan to wean off Inflammatory markers slightly elevated, procalcitonin negative, D-dimer 1.63 Chest x-ray showed multifocal pneumonia COVID-19 test x2 negative Will manage as Covid in addition to CAP Continue remdesivir, Decadron Start azithromycin, ceftriaxone Supplemental oxygen as needed, cough suppressant, vitamins Trend inflammatory markers Monitor closely  Hypertension Continue amlodipine        Malnutrition Type:      Malnutrition Characteristics:      Nutrition Interventions:       Estimated body mass index is 29.02 kg/m as calculated from the following:   Height as of  this encounter: 6\' 2"  (1.88 m).   Weight as of this encounter: 102.5 kg.     Code Status: Full  Family Communication: None at bedside  Disposition Plan: To be determined   Consultants:  None  Procedures:  None  Antimicrobials:  Ceftriaxone  Azithromycin  DVT prophylaxis: Lovenox   Objective: Vitals:   06/12/19 2016 06/13/19 0050 06/13/19 0535 06/13/19 1308  BP: 114/71  116/83 117/77  Pulse: 73  61   Resp: 18  18 16   Temp: 98.8 F (37.1 C) 98.4 F (36.9 C) 98.1 F (36.7 C) 98.2 F (36.8 C)  TempSrc: Oral  Oral Oral  SpO2: 93%     Weight:      Height:        Intake/Output Summary (Last 24 hours) at 06/13/2019 1537 Last data filed at 06/13/2019 1500 Gross per 24 hour  Intake 1186.23 ml  Output --  Net 1186.23 ml   Filed Weights   06/12/19 0135  Weight: 102.5 kg    Exam:  General: NAD   Cardiovascular: S1, S2 present  Respiratory: CTAB  Abdomen: Soft, nontender, nondistended, bowel sounds present  Musculoskeletal: No bilateral pedal edema noted  Skin: Normal  Psychiatry: Normal mood   Data Reviewed: CBC: Recent Labs  Lab 06/11/19 1622 06/12/19 0249 06/13/19 0245  WBC 4.3 3.4* 4.6  NEUTROABS  --  2.7 3.3  HGB 11.9* 11.6* 11.5*  HCT 38.5 38.0 38.2  MCV 89.1 90.3 91.4  PLT 257 249 [id number]   Basic Metabolic Panel: Recent Labs  Lab 06/11/19 1622 06/12/19 0249 06/13/19 0245  NA 137 139 138  K 4.1 4.3 5.0  CL 101 107 103  CO2 25 20* 25  GLUCOSE 88 111* 148*  BUN 18 17 25*  CREATININE 1.26* 0.89 1.13*  CALCIUM 8.7* 8.4* 8.5*   GFR: Estimated Creatinine Clearance: 76 mL/min (A) (by C-G formula based on SCr of 1.13 mg/dL (H)). Liver Function Tests: Recent Labs  Lab 06/11/19 1622 06/12/19 0249 06/13/19 0245  AST 30 30 27   ALT 28 27 25   ALKPHOS 66 61 51  BILITOT 0.6 0.5 0.6  PROT 7.3 7.6 7.3  ALBUMIN 3.6 3.5 3.4*   Recent Labs  Lab 06/11/19 1622  LIPASE 66*   No results for input(s): AMMONIA in the last 168  hours. Coagulation Profile: No results for input(s): INR, PROTIME in the last 168 hours. Cardiac Enzymes: No results for input(s): CKTOTAL, CKMB, CKMBINDEX, TROPONINI in the last 168 hours. BNP (last 3 results) No results for input(s): PROBNP in the last 8760 hours. HbA1C: No results for input(s): HGBA1C in the last 72 hours. CBG: No results for input(s): GLUCAP in the last 168 hours. Lipid Profile: No results for input(s): CHOL, HDL, LDLCALC, TRIG, CHOLHDL, LDLDIRECT in the last 72 hours. Thyroid Function Tests: No results for input(s): TSH, T4TOTAL, FREET4, T3FREE, THYROIDAB in the last 72 hours. Anemia Panel: No results for input(s): VITAMINB12, FOLATE, FERRITIN, TIBC, IRON, RETICCTPCT in the last 72 hours. Urine analysis:    Component Value Date/Time   COLORURINE YELLOW 06/11/2019 1314   APPEARANCEUR HAZY (A) 06/11/2019 1314   LABSPEC 1.016 06/11/2019 1314   PHURINE 5.0 06/11/2019 1314   GLUCOSEU NEGATIVE 06/11/2019 1314   HGBUR NEGATIVE 06/11/2019 1314   BILIRUBINUR NEGATIVE 06/11/2019 1314   BILIRUBINUR negative 12/07/2015 1226   KETONESUR 20 (A) 06/11/2019 1314   PROTEINUR 30 (A) 06/11/2019 1314   UROBILINOGEN 0.2 12/07/2015 1226   NITRITE NEGATIVE 06/11/2019 1314   LEUKOCYTESUR TRACE (A) 06/11/2019 1314   Sepsis Labs: @LABRCNTIP (procalcitonin:4,lacticidven:4)  ) Recent Results (from the past 240 hour(s))  Respiratory Panel by RT PCR (Flu A&B, Covid) - Nasopharyngeal Swab     Status: None   Collection Time: 06/11/19  5:36 PM   Specimen: Nasopharyngeal Swab  Result Value Ref Range Status   SARS Coronavirus 2 by RT PCR NEGATIVE NEGATIVE Final    Comment: (NOTE) SARS-CoV-2 target nucleic acids are NOT DETECTED. The SARS-CoV-2 RNA is generally detectable in upper respiratoy specimens during the acute phase of infection. The lowest concentration of SARS-CoV-2 viral copies this assay can detect is 131 copies/mL. A negative result does not preclude  SARS-Cov-2 infection and should not be used as the sole basis for treatment or other patient management decisions. A negative result may occur with  improper specimen collection/handling, submission of specimen other than nasopharyngeal swab, presence of viral mutation(s) within the areas targeted by this assay, and inadequate number of viral copies (<131 copies/mL). A negative result must be combined with clinical observations, patient history, and epidemiological information. The expected result is Negative. Fact Sheet for Patients:  PinkCheek.be Fact Sheet for Healthcare Providers:  GravelBags.it This test is not yet ap proved or cleared by the [place] FDA and  has been authorized for detection and/or diagnosis of SARS-CoV-2 by FDA under an Emergency Use Authorization (EUA). This EUA will remain  in effect (meaning this test can be used) for the duration of the COVID-19 declaration under Section 564(b)(1) of the Act, 21 U.S.C. section 360bbb-3(b)(1), unless the authorization is terminated or revoked sooner.    Influenza A by PCR NEGATIVE NEGATIVE Final  Influenza B by PCR NEGATIVE NEGATIVE Final    Comment: (NOTE) The Xpert Xpress SARS-CoV-2/FLU/RSV assay is intended as an aid in  the diagnosis of influenza from Nasopharyngeal swab specimens and  should not be used as a sole basis for treatment. Nasal washings and  aspirates are unacceptable for Xpert Xpress SARS-CoV-2/FLU/RSV  testing. Fact Sheet for Patients: PinkCheek.be Fact Sheet for Healthcare Providers: GravelBags.it This test is not yet approved or cleared by the [place] FDA and  has been authorized for detection and/or diagnosis of SARS-CoV-2 by  FDA under an Emergency Use Authorization (EUA). This EUA will remain  in effect (meaning this test can be used) for the duration of the  Covid-19  declaration under Section 564(b)(1) of the Act, 21  U.S.C. section 360bbb-3(b)(1), unless the authorization is  terminated or revoked. Performed at [facility], [place] [address] [place], [place] [address]       Studies: No results found.  Scheduled Meds: . albuterol  2 puff Inhalation Q6H  . amLODipine  10 mg Oral Daily  . dexamethasone  6 mg Oral Q24H  . enoxaparin (LOVENOX) injection  40 mg Subcutaneous Q24H    Continuous Infusions: . azithromycin 500 mg (06/12/19 1611)  . cefTRIAXone (ROCEPHIN)  IV 1 g (06/12/19 1528)  . remdesivir 100 mg in NS 100 mL 100 mg (06/13/19 0906)     LOS: 2 days     Alma Friendly, MD [facility]  If 7PM-7AM, please contact night-coverage [email or web address] 06/13/2019, 3:37 PM

## 2019-06-13 NOTE — Plan of Care (Signed)
Pt able to slowly ambulate short distances in her room, but tires easily. Pt uses front wheel walker, which is not her baseline.

## 2019-06-14 LAB — COMPREHENSIVE METABOLIC PANEL
ALT: 22 U/L (ref 0–44)
AST: 21 U/L (ref 15–41)
Albumin: 3.4 g/dL — ABNORMAL LOW (ref 3.5–5.0)
Alkaline Phosphatase: 51 U/L (ref 38–126)
Anion gap: 9 (ref 5–15)
BUN: 24 mg/dL — ABNORMAL HIGH (ref 6–20)
CO2: 24 mmol/L (ref 22–32)
Calcium: 8.4 mg/dL — ABNORMAL LOW (ref 8.9–10.3)
Chloride: 104 mmol/L (ref 98–111)
Creatinine, Ser: 0.83 mg/dL (ref 0.44–1.00)
GFR calc Af Amer: >60 mL/min (ref >60)
GFR calc non Af Amer: >60 mL/min (ref >60)
Glucose, Bld: 139 mg/dL — ABNORMAL HIGH (ref 70–99)
Potassium: 4.5 mmol/L (ref 3.5–5.1)
Sodium: 137 mmol/L (ref 135–145)
Total Bilirubin: 0.8 mg/dL (ref 0.3–1.2)
Total Protein: 6.9 g/dL (ref 6.5–8.1)

## 2019-06-14 LAB — D-DIMER, QUANTITATIVE: D-Dimer, Quant: 0.91 ug/mL-FEU — ABNORMAL HIGH (ref 0.00–0.50)

## 2019-06-14 LAB — CBC WITH DIFFERENTIAL/PLATELET
Abs Immature Granulocytes: 0.02 10*3/uL (ref 0.00–0.07)
Basophils Absolute: 0 10*3/uL (ref 0.0–0.1)
Basophils Relative: 0 %
Eosinophils Absolute: 0 10*3/uL (ref 0.0–0.5)
Eosinophils Relative: 0 %
HCT: 35 % — ABNORMAL LOW (ref 36.0–46.0)
Hemoglobin: 10.9 g/dL — ABNORMAL LOW (ref 12.0–15.0)
Immature Granulocytes: 0 %
Lymphocytes Relative: 23 %
Lymphs Abs: 1.1 10*3/uL (ref 0.7–4.0)
MCH: 27.5 pg (ref 26.0–34.0)
MCHC: 31.1 g/dL (ref 30.0–36.0)
MCV: 88.2 fL (ref 80.0–100.0)
Monocytes Absolute: 0.2 10*3/uL (ref 0.1–1.0)
Monocytes Relative: 5 %
Neutro Abs: 3.4 10*3/uL (ref 1.7–7.7)
Neutrophils Relative %: 72 %
Platelets: 296 10*3/uL (ref 150–400)
RBC: 3.97 MIL/uL (ref 3.87–5.11)
RDW: 14.1 % (ref 11.5–15.5)
WBC: 4.8 10*3/uL (ref 4.0–10.5)
nRBC: 0 % (ref 0.0–0.2)

## 2019-06-14 LAB — NOVEL CORONAVIRUS, NAA: SARS-CoV-2, NAA: DETECTED — AB

## 2019-06-14 LAB — C-REACTIVE PROTEIN: CRP: 0.8 mg/dL (ref <1.0)

## 2019-06-14 MED ORDER — DOCUSATE SODIUM 100 MG PO CAPS
100.0000 mg | ORAL_CAPSULE | Freq: Two times a day (BID) | ORAL | Status: DC | PRN
  Administered 2019-06-14 – 2019-06-15 (×3): 100 mg via ORAL
  Filled 2019-06-14 (×3): qty 1

## 2019-06-14 MED ORDER — SENNA 8.6 MG PO TABS
1.0000 | ORAL_TABLET | Freq: Every day | ORAL | Status: DC
  Administered 2019-06-14 – 2019-06-15 (×2): 8.6 mg via ORAL
  Filled 2019-06-14 (×2): qty 1

## 2019-06-14 NOTE — Plan of Care (Signed)
Pt's VS has been WDL and pt is on RA w/ Sat's above 90%.

## 2019-06-14 NOTE — Progress Notes (Signed)
PROGRESS NOTE    Lynn Estrada  [id number] DOB: 1961/05/02 DOA: 06/11/2019 PCP: [name], PA   Brief Narrative:  Lynn Heidbrink Littleis a 58 y.o.femalewith medical history significant ofHTN, presents to the ED with ~2 week history of body aches, fatigue, cough, extensive vomiting, chills, and fever up to 100 at home. Pt has multiple sick contacts: multiple family members who she saw over thanksgiving have not only similar symptoms but have been formally diagnosed with COVID-19. She had a COVID test on 12/2, which came back "indeterminate". RVP today is negative for COVID as well as influenza. In the ED, Tm 99.7. Creat 1.26. D.dimer 1.36, satting mid 80s on RA, improves to mid 90s on 3L O2 via [place]. CXR shows multifocal pneumonia that would be C/W COVID.  Patient admitted for further management.  She still has cough, mild shortness of breath.  Remains afebrile  Assessment & Plan:   Principal Problem:   Suspected COVID-19 virus infection Active Problems:   Essential hypertension   Acute respiratory failure with hypoxia (HCC)   Multifocal pneumonia  CAP vs suspected Covid 19 virus pneumonia: Currently afebrile, no leukocytosis Required about 2 L of oxygen overnight, plan to wean off Inflammatory markers slightly elevated, procalcitonin negative, D-dimer 1.63 Chest x-ray showed multifocal pneumonia COVID-19 test x2 negative. Will manage as Covid in addition to CAP: Continue remdesivir, Decadron. Continue azithromycin, ceftriaxone Supplemental oxygen as needed, cough suppressant, vitamins Trend inflammatory markers Monitor closely  Hypertension Continue amlodipine  DVT prophylaxis: Lovenox Code Status:   Full code Family Communication: D/W patient in detail. Disposition Plan: To be determined Consultants:    None  Procedures: Antimicrobials :  Anti-infectives (From admission, onward)   Start     Dose/Rate Route Frequency Ordered Stop   06/12/19 1400   azithromycin (ZITHROMAX) 500 mg in sodium chloride 0.9 % 250 mL IVPB     500 mg 250 mL/hr over 60 Minutes Intravenous Every 24 hours 06/12/19 1246     06/12/19 1400  cefTRIAXone (ROCEPHIN) 1 g in sodium chloride 0.9 % 100 mL IVPB     1 g 200 mL/hr over 30 Minutes Intravenous Every 24 hours 06/12/19 1246     06/12/19 1000  remdesivir 100 mg in sodium chloride 0.9 % 100 mL IVPB     100 mg 200 mL/hr over 30 Minutes Intravenous Daily 06/11/19 1952 06/16/19 0959   06/11/19 2000  remdesivir 200 mg in sodium chloride 0.9% 250 mL IVPB     200 mg 580 mL/hr over 30 Minutes Intravenous Once 06/11/19 1952 06/11/19 2208     Subjective: Patient was seen and examined at bedside, she is a still complaining about shortness of breath and coughing denies any chest pain.  No overnight events.  Objective: Vitals:   06/13/19 2028 06/14/19 0648 06/14/19 0948 06/14/19 1326  BP: 115/88 127/82 121/77 133/83  Pulse: 88 74  75  Resp: 18 18  16   Temp: 98.7 F (37.1 C) 98.6 F (37 C)  98.1 F (36.7 C)  TempSrc: Oral Oral  Oral  SpO2: 92%   94%  Weight:      Height:        Intake/Output Summary (Last 24 hours) at 06/14/2019 1658 Last data filed at 06/14/2019 1336 Gross per 24 hour  Intake 460 ml  Output --  Net 460 ml   Filed Weights   06/12/19 0135  Weight: 102.5 kg    Examination:  General exam: Appears calm and comfortable  Respiratory system: Clear to  auscultation. Respiratory effort normal. Cardiovascular system: S1 & S2 heard, RRR. No JVD, murmurs, rubs, gallops or clicks. No pedal edema. Gastrointestinal system: Abdomen is nondistended, soft and nontender. No organomegaly or masses felt. Normal bowel sounds heard. Central nervous system: Alert and oriented. No focal neurological deficits. Extremities: Symmetric 5 x 5 power. Skin: No rashes, lesions or ulcers Psychiatry: Judgement and insight appear normal. Mood & affect appropriate.     Data Reviewed: I have personally reviewed  following labs and imaging studies  CBC: Recent Labs  Lab 06/11/19 1622 06/12/19 0249 06/13/19 0245 06/14/19 0237  WBC 4.3 3.4* 4.6 4.8  NEUTROABS  --  2.7 3.3 3.4  HGB 11.9* 11.6* 11.5* 10.9*  HCT 38.5 38.0 38.2 35.0*  MCV 89.1 90.3 91.4 88.2  PLT 257 249 231 0000000   Basic Metabolic Panel: Recent Labs  Lab 06/11/19 1622 06/12/19 0249 06/13/19 0245 06/14/19 0237  NA 137 139 138 137  K 4.1 4.3 5.0 4.5  CL 101 107 103 104  CO2 25 20* 25 24  GLUCOSE 88 111* 148* 139*  BUN 18 17 25* 24*  CREATININE 1.26* 0.89 1.13* 0.83  CALCIUM 8.7* 8.4* 8.5* 8.4*   GFR: Estimated Creatinine Clearance: 103.4 mL/min (by C-G formula based on SCr of 0.83 mg/dL). Liver Function Tests: Recent Labs  Lab 06/11/19 1622 06/12/19 0249 06/13/19 0245 06/14/19 0237  AST 30 30 27 21   ALT 28 27 25 22   ALKPHOS 66 61 51 51  BILITOT 0.6 0.5 0.6 0.8  PROT 7.3 7.6 7.3 6.9  ALBUMIN 3.6 3.5 3.4* 3.4*   Recent Labs  Lab 06/11/19 1622  LIPASE 66*   No results for input(s): AMMONIA in the last 168 hours. Coagulation Profile: No results for input(s): INR, PROTIME in the last 168 hours. Cardiac Enzymes: No results for input(s): CKTOTAL, CKMB, CKMBINDEX, TROPONINI in the last 168 hours. BNP (last 3 results) No results for input(s): PROBNP in the last 8760 hours. HbA1C: No results for input(s): HGBA1C in the last 72 hours. CBG: No results for input(s): GLUCAP in the last 168 hours. Lipid Profile: No results for input(s): CHOL, HDL, LDLCALC, TRIG, CHOLHDL, LDLDIRECT in the last 72 hours. Thyroid Function Tests: No results for input(s): TSH, T4TOTAL, FREET4, T3FREE, THYROIDAB in the last 72 hours. Anemia Panel: No results for input(s): VITAMINB12, FOLATE, FERRITIN, TIBC, IRON, RETICCTPCT in the last 72 hours. Sepsis Labs: Recent Labs  Lab 06/11/19 2031  PROCALCITON <0.10    Recent Results (from the past 240 hour(s))  Novel Coronavirus, NAA ([facility])     Status: Abnormal   Collection Time:  06/10/19 12:00 AM   Specimen: Nasopharyngeal(NP) swabs in vial transport medium   NASOPHARYNGE  TESTING  Result Value Ref Range Status   SARS-CoV-2, NAA Detected (A) Not Detected Final    Comment: This nucleic acid amplification test was developed and its performance characteristics determined by [employer] and [employer]. Nucleic acid amplification tests include PCR and TMA. This test has not been FDA cleared or approved. This test has been authorized by FDA under an Emergency Use Authorization (EUA). This test is only authorized for the duration of time the declaration that circumstances exist justifying the authorization of the emergency use of in vitro diagnostic tests for detection of SARS-CoV-2 virus and/or diagnosis of COVID-19 infection under section 564(b)(1) of the Act, 21 U.S.C. [id number]) (1), unless the authorization is terminated or revoked sooner. When diagnostic testing is negative, the possibility of a false negative result should be considered in the context  of a patient's recent exposures and the presence of clinical signs and symptoms consistent with COVID-19. An individual without symptoms of COVID-19 and who is not shedding SARS-CoV-2 virus would  expect to have a negative (not detected) result in this assay.   Respiratory Panel by RT PCR (Flu A&B, Covid) - Nasopharyngeal Swab     Status: None   Collection Time: 06/11/19  5:36 PM   Specimen: Nasopharyngeal Swab  Result Value Ref Range Status   SARS Coronavirus 2 by RT PCR NEGATIVE NEGATIVE Final    Comment: (NOTE) SARS-CoV-2 target nucleic acids are NOT DETECTED. The SARS-CoV-2 RNA is generally detectable in upper respiratoy specimens during the acute phase of infection. The lowest concentration of SARS-CoV-2 viral copies this assay can detect is 131 copies/mL. A negative result does not preclude SARS-Cov-2 infection and should not be used as the sole basis for treatment or other patient management decisions. A  negative result may occur with  improper specimen collection/handling, submission of specimen other than nasopharyngeal swab, presence of viral mutation(s) within the areas targeted by this assay, and inadequate number of viral copies (<131 copies/mL). A negative result must be combined with clinical observations, patient history, and epidemiological information. The expected result is Negative. Fact Sheet for Patients:  PinkCheek.be Fact Sheet for Healthcare Providers:  GravelBags.it This test is not yet ap proved or cleared by the [place] FDA and  has been authorized for detection and/or diagnosis of SARS-CoV-2 by FDA under an Emergency Use Authorization (EUA). This EUA will remain  in effect (meaning this test can be used) for the duration of the COVID-19 declaration under Section 564(b)(1) of the Act, 21 U.S.C. section 360bbb-3(b)(1), unless the authorization is terminated or revoked sooner.    Influenza A by PCR NEGATIVE NEGATIVE Final   Influenza B by PCR NEGATIVE NEGATIVE Final    Comment: (NOTE) The Xpert Xpress SARS-CoV-2/FLU/RSV assay is intended as an aid in  the diagnosis of influenza from Nasopharyngeal swab specimens and  should not be used as a sole basis for treatment. Nasal washings and  aspirates are unacceptable for Xpert Xpress SARS-CoV-2/FLU/RSV  testing. Fact Sheet for Patients: PinkCheek.be Fact Sheet for Healthcare Providers: GravelBags.it This test is not yet approved or cleared by the [place] FDA and  has been authorized for detection and/or diagnosis of SARS-CoV-2 by  FDA under an Emergency Use Authorization (EUA). This EUA will remain  in effect (meaning this test can be used) for the duration of the  Covid-19 declaration under Section 564(b)(1) of the Act, 21  U.S.C. section 360bbb-3(b)(1), unless the authorization is    terminated or revoked. Performed at [facility], [place] [address] [place], [place] [address]      Radiology Studies: No results found.  Scheduled Meds: . albuterol  2 puff Inhalation Q6H  . amLODipine  10 mg Oral Daily  . dexamethasone  6 mg Oral Q24H  . enoxaparin (LOVENOX) injection  40 mg Subcutaneous Q24H   Continuous Infusions: . azithromycin 500 mg (06/14/19 1517)  . cefTRIAXone (ROCEPHIN)  IV 1 g (06/13/19 1639)  . remdesivir 100 mg in NS 100 mL 100 mg (06/14/19 0953)     LOS: 3 days    Time spent:     Shawna Clamp, MD [facility]   If 7PM-7AM, please contact night-coverage

## 2019-06-15 LAB — CBC WITH DIFFERENTIAL/PLATELET
Abs Immature Granulocytes: 0.03 10*3/uL (ref 0.00–0.07)
Basophils Absolute: 0 10*3/uL (ref 0.0–0.1)
Basophils Relative: 0 %
Eosinophils Absolute: 0 10*3/uL (ref 0.0–0.5)
Eosinophils Relative: 0 %
HCT: 35.5 % — ABNORMAL LOW (ref 36.0–46.0)
Hemoglobin: 11 g/dL — ABNORMAL LOW (ref 12.0–15.0)
Immature Granulocytes: 1 %
Lymphocytes Relative: 27 %
Lymphs Abs: 1.3 10*3/uL (ref 0.7–4.0)
MCH: 27.2 pg (ref 26.0–34.0)
MCHC: 31 g/dL (ref 30.0–36.0)
MCV: 87.7 fL (ref 80.0–100.0)
Monocytes Absolute: 0.5 10*3/uL (ref 0.1–1.0)
Monocytes Relative: 10 %
Neutro Abs: 3 10*3/uL (ref 1.7–7.7)
Neutrophils Relative %: 62 %
Platelets: 316 10*3/uL (ref 150–400)
RBC: 4.05 MIL/uL (ref 3.87–5.11)
RDW: 14.1 % (ref 11.5–15.5)
WBC: 4.8 10*3/uL (ref 4.0–10.5)
nRBC: 0 % (ref 0.0–0.2)

## 2019-06-15 LAB — D-DIMER, QUANTITATIVE: D-Dimer, Quant: 0.55 ug/mL-FEU — ABNORMAL HIGH (ref 0.00–0.50)

## 2019-06-15 LAB — COMPREHENSIVE METABOLIC PANEL
ALT: 23 U/L (ref 0–44)
AST: 21 U/L (ref 15–41)
Albumin: 3.1 g/dL — ABNORMAL LOW (ref 3.5–5.0)
Alkaline Phosphatase: 49 U/L (ref 38–126)
Anion gap: 8 (ref 5–15)
BUN: 22 mg/dL — ABNORMAL HIGH (ref 6–20)
CO2: 23 mmol/L (ref 22–32)
Calcium: 8.5 mg/dL — ABNORMAL LOW (ref 8.9–10.3)
Chloride: 106 mmol/L (ref 98–111)
Creatinine, Ser: 0.72 mg/dL (ref 0.44–1.00)
GFR calc Af Amer: >60 mL/min (ref >60)
GFR calc non Af Amer: >60 mL/min (ref >60)
Glucose, Bld: 135 mg/dL — ABNORMAL HIGH (ref 70–99)
Potassium: 4 mmol/L (ref 3.5–5.1)
Sodium: 137 mmol/L (ref 135–145)
Total Bilirubin: 0.4 mg/dL (ref 0.3–1.2)
Total Protein: 6.9 g/dL (ref 6.5–8.1)

## 2019-06-15 LAB — C-REACTIVE PROTEIN: CRP: 0.9 mg/dL (ref <1.0)

## 2019-06-15 MED ORDER — FLEET ENEMA 7-19 GM/118ML RE ENEM
1.0000 | ENEMA | Freq: Once | RECTAL | Status: AC
  Administered 2019-06-15: 1 via RECTAL
  Filled 2019-06-15: qty 1

## 2019-06-15 NOTE — Progress Notes (Signed)
PROGRESS NOTE    Lynn Estrada  [id number] DOB: 1960/12/17 DOA: 06/11/2019 PCP: [name], PA   Brief Narrative:  Lynn Estrada a 58 y.o.femalewith medical history significant ofHTN, presents to the ED with ~2 week history of body aches, fatigue, cough, extensive vomiting, chills, and fever up to 100 at home. Pt has multiple sick contacts: multiple family members who she saw over thanksgiving have not only similar symptoms but have been formally diagnosed with COVID-19. She had a COVID test on 12/2, which came back "indeterminate". RVP today is negative for COVID as well as influenza. In the ED, Tm 99.7. Creat 1.26. D.dimer 1.36, satting mid 80s on RA, improves to mid 90s on 3L O2 via [place]. CXR shows multifocal pneumonia that would be C/W COVID.  Patient admitted for further management.  She still has cough, mild shortness of breath.  Remains afebrile  Assessment & Plan:   Principal Problem:   Suspected COVID-19 virus infection Active Problems:   Essential hypertension   Acute respiratory failure with hypoxia (HCC)   Multifocal pneumonia  CAP vs suspected Covid 19 virus pneumonia: Currently afebrile, no leukocytosis On Room air from 2 l now.  Inflammatory markers slightly elevated, procalcitonin negative, D-dimer 1.63 Chest x-ray showed multifocal pneumonia COVID-19 test x2 negative. Will manage as Covid in addition to CAP: Continue remdesivir, Decadron. Continue azithromycin, ceftriaxone Supplemental oxygen as needed, cough suppressant, vitamins Trend inflammatory markers Monitor closely  Hypertension Continue amlodipine  DVT prophylaxis: Lovenox Code Status:   Full code Family Communication: D/W patient in detail. Disposition Plan: To be determined Consultants:    None  Procedures: Antimicrobials :  Anti-infectives (From admission, onward)   Start     Dose/Rate Route Frequency Ordered Stop   06/12/19 1400  azithromycin (ZITHROMAX) 500 mg in  sodium chloride 0.9 % 250 mL IVPB     500 mg 250 mL/hr over 60 Minutes Intravenous Every 24 hours 06/12/19 1246     06/12/19 1400  cefTRIAXone (ROCEPHIN) 1 g in sodium chloride 0.9 % 100 mL IVPB     1 g 200 mL/hr over 30 Minutes Intravenous Every 24 hours 06/12/19 1246     06/12/19 1000  remdesivir 100 mg in sodium chloride 0.9 % 100 mL IVPB     100 mg 200 mL/hr over 30 Minutes Intravenous Daily 06/11/19 1952 06/15/19 0934   06/11/19 2000  remdesivir 200 mg in sodium chloride 0.9% 250 mL IVPB     200 mg 580 mL/hr over 30 Minutes Intravenous Once 06/11/19 1952 06/11/19 2208     Subjective: Patient was seen and examined at bedside,  Does have dry cough but dyspnea is somewhat better.  denies any chest pain.  No overnight events.  Objective: Vitals:   06/14/19 1958 06/15/19 0409 06/15/19 0859 06/15/19 1328  BP: (!) 152/76 (!) 146/90 134/86 121/78  Pulse: 68 (!) 51  77  Resp: (!) 22 18  16   Temp: 97.8 F (36.6 C) 97.9 F (36.6 C)  98.5 F (36.9 C)  TempSrc: Oral Oral  Oral  SpO2: 96% 100%  96%  Weight:      Height:        Intake/Output Summary (Last 24 hours) at 06/15/2019 1803 Last data filed at 06/15/2019 1237 Gross per 24 hour  Intake 540 ml  Output --  Net 540 ml   Filed Weights   06/12/19 0135  Weight: 102.5 kg    Examination:  General exam: Appears calm and comfortable  Respiratory system: Clear  to auscultation. Respiratory effort normal. Cardiovascular system: S1 & S2 heard, RRR. No JVD, murmurs, rubs, gallops or clicks. No pedal edema. Gastrointestinal system: Abdomen is nondistended, soft and nontender. No organomegaly or masses felt. Normal bowel sounds heard. Central nervous system: Alert and oriented. No focal neurological deficits. Extremities: Symmetric 5 x 5 power. Skin: No rashes, lesions or ulcers Psychiatry: Judgement and insight appear normal. Mood & affect appropriate.     Data Reviewed: I have personally reviewed following labs and imaging  studies  CBC: Recent Labs  Lab 06/11/19 1622 06/12/19 0249 06/13/19 0245 06/14/19 0237 06/15/19 0252  WBC 4.3 3.4* 4.6 4.8 4.8  NEUTROABS  --  2.7 3.3 3.4 3.0  HGB 11.9* 11.6* 11.5* 10.9* 11.0*  HCT 38.5 38.0 38.2 35.0* 35.5*  MCV 89.1 90.3 91.4 88.2 87.7  PLT 257 249 231 296 [id number]   Basic Metabolic Panel: Recent Labs  Lab 06/11/19 1622 06/12/19 0249 06/13/19 0245 06/14/19 0237 06/15/19 0252  NA 137 139 138 137 137  K 4.1 4.3 5.0 4.5 4.0  CL 101 107 103 104 106  CO2 25 20* 25 24 23   GLUCOSE 88 111* 148* 139* 135*  BUN 18 17 25* 24* 22*  CREATININE 1.26* 0.89 1.13* 0.83 0.72  CALCIUM 8.7* 8.4* 8.5* 8.4* 8.5*   GFR: Estimated Creatinine Clearance: 107.3 mL/min (by C-G formula based on SCr of 0.72 mg/dL). Liver Function Tests: Recent Labs  Lab 06/11/19 1622 06/12/19 0249 06/13/19 0245 06/14/19 0237 06/15/19 0252  AST 30 30 27 21 21   ALT 28 27 25 22 23   ALKPHOS 66 61 51 51 49  BILITOT 0.6 0.5 0.6 0.8 0.4  PROT 7.3 7.6 7.3 6.9 6.9  ALBUMIN 3.6 3.5 3.4* 3.4* 3.1*   Recent Labs  Lab 06/11/19 1622  LIPASE 66*   No results for input(s): AMMONIA in the last 168 hours. Coagulation Profile: No results for input(s): INR, PROTIME in the last 168 hours. Cardiac Enzymes: No results for input(s): CKTOTAL, CKMB, CKMBINDEX, TROPONINI in the last 168 hours. BNP (last 3 results) No results for input(s): PROBNP in the last 8760 hours. HbA1C: No results for input(s): HGBA1C in the last 72 hours. CBG: No results for input(s): GLUCAP in the last 168 hours. Lipid Profile: No results for input(s): CHOL, HDL, LDLCALC, TRIG, CHOLHDL, LDLDIRECT in the last 72 hours. Thyroid Function Tests: No results for input(s): TSH, T4TOTAL, FREET4, T3FREE, THYROIDAB in the last 72 hours. Anemia Panel: No results for input(s): VITAMINB12, FOLATE, FERRITIN, TIBC, IRON, RETICCTPCT in the last 72 hours. Sepsis Labs: Recent Labs  Lab 06/11/19 2031  PROCALCITON <0.10    Recent Results  (from the past 240 hour(s))  Novel Coronavirus, NAA ([facility])     Status: Abnormal   Collection Time: 06/10/19 12:00 AM   Specimen: Nasopharyngeal(NP) swabs in vial transport medium   NASOPHARYNGE  TESTING  Result Value Ref Range Status   SARS-CoV-2, NAA Detected (A) Not Detected Final    Comment: This nucleic acid amplification test was developed and its performance characteristics determined by [employer], [employer] and [employer]. Nucleic acid amplification tests include PCR and TMA. This test has not been FDA cleared or approved. This test has been authorized by FDA under an Emergency Use Authorization (EUA). This test is only authorized for the duration of time the declaration that circumstances exist justifying the authorization of the emergency use of in vitro diagnostic tests for detection of SARS-CoV-2 virus and/or diagnosis of COVID-19 infection under section 564(b)(1) of the Act, 21 U.S.C. [id number]) (  1), unless the authorization is terminated or revoked sooner. When diagnostic testing is negative, the possibility of a false negative result should be considered in the context of a patient's recent exposures and the presence of clinical signs and symptoms consistent with COVID-19. An individual without symptoms of COVID-19 and who is not shedding SARS-CoV-2 virus would  expect to have a negative (not detected) result in this assay.   Respiratory Panel by RT PCR (Flu A&B, Covid) - Nasopharyngeal Swab     Status: None   Collection Time: 06/11/19  5:36 PM   Specimen: Nasopharyngeal Swab  Result Value Ref Range Status   SARS Coronavirus 2 by RT PCR NEGATIVE NEGATIVE Final    Comment: (NOTE) SARS-CoV-2 target nucleic acids are NOT DETECTED. The SARS-CoV-2 RNA is generally detectable in upper respiratoy specimens during the acute phase of infection. The lowest concentration of SARS-CoV-2 viral copies this assay can detect is 131 copies/mL. A negative result does not preclude  SARS-Cov-2 infection and should not be used as the sole basis for treatment or other patient management decisions. A negative result may occur with  improper specimen collection/handling, submission of specimen other than nasopharyngeal swab, presence of viral mutation(s) within the areas targeted by this assay, and inadequate number of viral copies (<131 copies/mL). A negative result must be combined with clinical observations, patient history, and epidemiological information. The expected result is Negative. Fact Sheet for Patients:  PinkCheek.be Fact Sheet for Healthcare Providers:  GravelBags.it This test is not yet ap proved or cleared by the [place] FDA and  has been authorized for detection and/or diagnosis of SARS-CoV-2 by FDA under an Emergency Use Authorization (EUA). This EUA will remain  in effect (meaning this test can be used) for the duration of the COVID-19 declaration under Section 564(b)(1) of the Act, 21 U.S.C. section 360bbb-3(b)(1), unless the authorization is terminated or revoked sooner.    Influenza A by PCR NEGATIVE NEGATIVE Final   Influenza B by PCR NEGATIVE NEGATIVE Final    Comment: (NOTE) The Xpert Xpress SARS-CoV-2/FLU/RSV assay is intended as an aid in  the diagnosis of influenza from Nasopharyngeal swab specimens and  should not be used as a sole basis for treatment. Nasal washings and  aspirates are unacceptable for Xpert Xpress SARS-CoV-2/FLU/RSV  testing. Fact Sheet for Patients: PinkCheek.be Fact Sheet for Healthcare Providers: GravelBags.it This test is not yet approved or cleared by the [place] FDA and  has been authorized for detection and/or diagnosis of SARS-CoV-2 by  FDA under an Emergency Use Authorization (EUA). This EUA will remain  in effect (meaning this test can be used) for the duration of the  Covid-19  declaration under Section 564(b)(1) of the Act, 21  U.S.C. section 360bbb-3(b)(1), unless the authorization is  terminated or revoked. Performed at [facility], [place] [address] [place], [place] [address]      Radiology Studies: No results found.  Scheduled Meds: . albuterol  2 puff Inhalation Q6H  . amLODipine  10 mg Oral Daily  . dexamethasone  6 mg Oral Q24H  . enoxaparin (LOVENOX) injection  40 mg Subcutaneous Q24H  . senna  1 tablet Oral QHS   Continuous Infusions: . azithromycin 500 mg (06/15/19 1513)  . cefTRIAXone (ROCEPHIN)  IV 1 g (06/15/19 1710)     LOS: 4 days    Time spent:     Alashia Brownfield Harmon Pier, MD [facility]   If 7PM-7AM, please contact night-coverage

## 2019-06-15 NOTE — Progress Notes (Signed)
Paged [facility] floor coverage for enema per patient request, she feels the senna and colace are not working.  Does not feel nauseated, just a full uncomfortable feeling.  Awaiting orders   2030 fleet enema ordered, given 2100 good results per patient

## 2019-06-15 NOTE — Plan of Care (Signed)
Pt ambulation is improving. Pt asked for laxative, but declined enema. She also rec'd a stool softener.

## 2019-06-15 NOTE — Plan of Care (Signed)

## 2019-06-15 NOTE — TOC Progression Note (Addendum)
Transition of Care [facility]) - Progression Note    Patient Details  Name: Lynn Estrada MRN: [id number] Date of Birth: October 18, 1960  Transition of Care [facility]) CM/SW Contact  Purcell Mouton, RN Phone Number: 06/15/2019, 3:42 PM  Clinical Narrative:    Pt home with spouse. Will continue to follow for discharge needs.   Expected Discharge Plan: Home/Self Care Barriers to Discharge: No Barriers Identified  Expected Discharge Plan and Services Expected Discharge Plan: Home/Self Care       Living arrangements for the past 2 months: Single Family Home                                       Social Determinants of Health (SDOH) Interventions    Readmission Risk Interventions No flowsheet data found.

## 2019-06-16 LAB — CBC WITH DIFFERENTIAL/PLATELET
Abs Immature Granulocytes: 0.06 10*3/uL (ref 0.00–0.07)
Basophils Absolute: 0 10*3/uL (ref 0.0–0.1)
Basophils Relative: 0 %
Eosinophils Absolute: 0 10*3/uL (ref 0.0–0.5)
Eosinophils Relative: 0 %
HCT: 35.2 % — ABNORMAL LOW (ref 36.0–46.0)
Hemoglobin: 11 g/dL — ABNORMAL LOW (ref 12.0–15.0)
Immature Granulocytes: 1 %
Lymphocytes Relative: 34 %
Lymphs Abs: 2.2 10*3/uL (ref 0.7–4.0)
MCH: 27.6 pg (ref 26.0–34.0)
MCHC: 31.3 g/dL (ref 30.0–36.0)
MCV: 88.2 fL (ref 80.0–100.0)
Monocytes Absolute: 0.6 10*3/uL (ref 0.1–1.0)
Monocytes Relative: 9 %
Neutro Abs: 3.5 10*3/uL (ref 1.7–7.7)
Neutrophils Relative %: 56 %
Platelets: 316 10*3/uL (ref 150–400)
RBC: 3.99 MIL/uL (ref 3.87–5.11)
RDW: 13.8 % (ref 11.5–15.5)
WBC: 6.3 10*3/uL (ref 4.0–10.5)
nRBC: 0 % (ref 0.0–0.2)

## 2019-06-16 LAB — COMPREHENSIVE METABOLIC PANEL
ALT: 26 U/L (ref 0–44)
AST: 20 U/L (ref 15–41)
Albumin: 3.2 g/dL — ABNORMAL LOW (ref 3.5–5.0)
Alkaline Phosphatase: 48 U/L (ref 38–126)
Anion gap: 8 (ref 5–15)
BUN: 20 mg/dL (ref 6–20)
CO2: 23 mmol/L (ref 22–32)
Calcium: 8.1 mg/dL — ABNORMAL LOW (ref 8.9–10.3)
Chloride: 106 mmol/L (ref 98–111)
Creatinine, Ser: 0.75 mg/dL (ref 0.44–1.00)
GFR calc Af Amer: >60 mL/min (ref >60)
GFR calc non Af Amer: >60 mL/min (ref >60)
Glucose, Bld: 127 mg/dL — ABNORMAL HIGH (ref 70–99)
Potassium: 3.7 mmol/L (ref 3.5–5.1)
Sodium: 137 mmol/L (ref 135–145)
Total Bilirubin: 0.8 mg/dL (ref 0.3–1.2)
Total Protein: 6.6 g/dL (ref 6.5–8.1)

## 2019-06-16 LAB — C-REACTIVE PROTEIN: CRP: 1.2 mg/dL — ABNORMAL HIGH (ref <1.0)

## 2019-06-16 LAB — D-DIMER, QUANTITATIVE: D-Dimer, Quant: 0.59 ug/mL-FEU — ABNORMAL HIGH (ref 0.00–0.50)

## 2019-06-16 MED ORDER — CEFDINIR 300 MG PO CAPS
300.0000 mg | ORAL_CAPSULE | Freq: Two times a day (BID) | ORAL | Status: DC
  Administered 2019-06-16 – 2019-06-17 (×2): 300 mg via ORAL
  Filled 2019-06-16 (×3): qty 1

## 2019-06-16 NOTE — Progress Notes (Signed)
PROGRESS NOTE    Lynn Estrada  [id number] DOB: 09-17-1960 DOA: 06/11/2019 PCP: [name], PA   Brief Narrative:  Lynn Estrada a 58 y.o.femalewith medical history significant ofHTN, presents to the ED with ~2 week history of body aches, fatigue, cough, extensive vomiting, chills, and fever up to 100 at home. Pt has multiple sick contacts: multiple family members who she saw over thanksgiving have not only similar symptoms but have been formally diagnosed with COVID-19. She had a COVID test on 12/2, which came back "indeterminate". RVP today is negative for COVID as well as influenza. In the ED, Tm 99.7. Creat 1.26. D.dimer 1.36, satting mid 80s on RA, improves to mid 90s on 3L O2 via . CXR shows multifocal pneumonia that would be C/W COVID.  Patient admitted for further management.  She still has cough, mild shortness of breath.  Remains afebrile  Assessment & Plan:   Principal Problem:   Suspected COVID-19 virus infection Active Problems:   Essential hypertension   Acute respiratory failure with hypoxia (HCC)   Multifocal pneumonia  CAP vs suspected Covid 19 virus pneumonia: Currently afebrile, no leukocytosis On Room air from 2 l now.  Inflammatory markers slightly elevated, procalcitonin negative, D-dimer 1.63 Chest x-ray showed multifocal pneumonia COVID-19 test x2 negative. Will manage as Covid in addition to CAP: Continue remdesivir, Decadron. Finished course of azithromycin.  Switch IV Rocephin to cefdinir Supplemental oxygen as needed, cough suppressant, vitamins Trend inflammatory markers Monitor closely  Hypertension Continue amlodipine  DVT prophylaxis: Lovenox Code Status:   Full code Family Communication: D/W patient in detail. Disposition Plan: To be determined Consultants:    None  Procedures: Antimicrobials :  Anti-infectives (From admission, onward)   Start     Dose/Rate Route Frequency Ordered Stop   06/12/19 1400   azithromycin (ZITHROMAX) 500 mg in sodium chloride 0.9 % 250 mL IVPB     500 mg 250 mL/hr over 60 Minutes Intravenous Every 24 hours 06/12/19 1246 06/16/19 1811   06/12/19 1400  cefTRIAXone (ROCEPHIN) 1 g in sodium chloride 0.9 % 100 mL IVPB     1 g 200 mL/hr over 30 Minutes Intravenous Every 24 hours 06/12/19 1246     06/12/19 1000  remdesivir 100 mg in sodium chloride 0.9 % 100 mL IVPB     100 mg 200 mL/hr over 30 Minutes Intravenous Daily 06/11/19 1952 06/15/19 0934   06/11/19 2000  remdesivir 200 mg in sodium chloride 0.9% 250 mL IVPB     200 mg 580 mL/hr over 30 Minutes Intravenous Once 06/11/19 1952 06/11/19 2208     Subjective: Patient was seen and examined at bedside,  Does have dry cough but dyspnea is better.  denies any chest pain.  No overnight events.  Patient is eating and drinking fine, no trouble passing urine or stool  Objective: Vitals:   06/15/19 1328 06/15/19 1957 06/16/19 0549 06/16/19 1528  BP: 121/78 128/79 130/68 112/76  Pulse: 77 62 (!) 52 70  Resp: 16 20 18 18   Temp: 98.5 F (36.9 C) 98 F (36.7 C) 98.1 F (36.7 C) 98.8 F (37.1 C)  TempSrc: Oral Oral Oral Oral  SpO2: 96% 100%  97%  Weight:      Height:        Intake/Output Summary (Last 24 hours) at 06/16/2019 1850 Last data filed at 06/16/2019 1300 Gross per 24 hour  Intake 953.2 ml  Output --  Net 953.2 ml   Filed Weights   06/12/19 0135  Weight: 102.5 kg    Examination:  General exam: Appears calm and comfortable  Respiratory system: Clear to auscultation. Respiratory effort normal. Cardiovascular system: S1 & S2 heard, RRR. No JVD, murmurs, rubs, gallops or clicks. No pedal edema. Gastrointestinal system: Abdomen is nondistended, soft and nontender. No organomegaly or masses felt. Normal bowel sounds heard. Central nervous system: Alert and oriented. No focal neurological deficits. Extremities: Symmetric 5 x 5 power. Skin: No rashes, lesions or ulcers Psychiatry: Judgement and  insight appear normal. Mood & affect appropriate.     Data Reviewed: I have personally reviewed following labs and imaging studies  CBC: Recent Labs  Lab 06/12/19 0249 06/13/19 0245 06/14/19 0237 06/15/19 0252 06/16/19 0322  WBC 3.4* 4.6 4.8 4.8 6.3  NEUTROABS 2.7 3.3 3.4 3.0 3.5  HGB 11.6* 11.5* 10.9* 11.0* 11.0*  HCT 38.0 38.2 35.0* 35.5* 35.2*  MCV 90.3 91.4 88.2 87.7 88.2  PLT 249 231 296 316 [id number]   Basic Metabolic Panel: Recent Labs  Lab 06/12/19 0249 06/13/19 0245 06/14/19 0237 06/15/19 0252 06/16/19 0322  NA 139 138 137 137 137  K 4.3 5.0 4.5 4.0 3.7  CL 107 103 104 106 106  CO2 20* 25 24 23 23   GLUCOSE 111* 148* 139* 135* 127*  BUN 17 25* 24* 22* 20  CREATININE 0.89 1.13* 0.83 0.72 0.75  CALCIUM 8.4* 8.5* 8.4* 8.5* 8.1*   GFR: Estimated Creatinine Clearance: 107.3 mL/min (by C-G formula based on SCr of 0.75 mg/dL). Liver Function Tests: Recent Labs  Lab 06/12/19 0249 06/13/19 0245 06/14/19 0237 06/15/19 0252 06/16/19 0322  AST 30 27 21 21 20   ALT 27 25 22 23 26   ALKPHOS 61 51 51 49 48  BILITOT 0.5 0.6 0.8 0.4 0.8  PROT 7.6 7.3 6.9 6.9 6.6  ALBUMIN 3.5 3.4* 3.4* 3.1* 3.2*   Recent Labs  Lab 06/11/19 1622  LIPASE 66*   No results for input(s): AMMONIA in the last 168 hours. Coagulation Profile: No results for input(s): INR, PROTIME in the last 168 hours. Cardiac Enzymes: No results for input(s): CKTOTAL, CKMB, CKMBINDEX, TROPONINI in the last 168 hours. BNP (last 3 results) No results for input(s): PROBNP in the last 8760 hours. HbA1C: No results for input(s): HGBA1C in the last 72 hours. CBG: No results for input(s): GLUCAP in the last 168 hours. Lipid Profile: No results for input(s): CHOL, HDL, LDLCALC, TRIG, CHOLHDL, LDLDIRECT in the last 72 hours. Thyroid Function Tests: No results for input(s): TSH, T4TOTAL, FREET4, T3FREE, THYROIDAB in the last 72 hours. Anemia Panel: No results for input(s): VITAMINB12, FOLATE, FERRITIN, TIBC,  IRON, RETICCTPCT in the last 72 hours. Sepsis Labs: Recent Labs  Lab 06/11/19 2031  PROCALCITON <0.10    Recent Results (from the past 240 hour(s))  Novel Coronavirus, NAA ([facility])     Status: Abnormal   Collection Time: 06/10/19 12:00 AM   Specimen: Nasopharyngeal(NP) swabs in vial transport medium   NASOPHARYNGE  TESTING  Result Value Ref Range Status   SARS-CoV-2, NAA Detected (A) Not Detected Final    Comment: This nucleic acid amplification test was developed and its performance characteristics determined by [employer]. Nucleic acid amplification tests include PCR and TMA. This test has not been FDA cleared or approved. This test has been authorized by FDA under an Emergency Use Authorization (EUA). This test is only authorized for the duration of time the declaration that circumstances exist justifying the authorization of the emergency use of in vitro diagnostic tests for detection of  SARS-CoV-2 virus and/or diagnosis of COVID-19 infection under section 564(b)(1) of the Act, 21 U.S.C. [id number]) (1), unless the authorization is terminated or revoked sooner. When diagnostic testing is negative, the possibility of a false negative result should be considered in the context of a patient's recent exposures and the presence of clinical signs and symptoms consistent with COVID-19. An individual without symptoms of COVID-19 and who is not shedding SARS-CoV-2 virus would  expect to have a negative (not detected) result in this assay.   Respiratory Panel by RT PCR (Flu A&B, Covid) - Nasopharyngeal Swab     Status: None   Collection Time: 06/11/19  5:36 PM   Specimen: Nasopharyngeal Swab  Result Value Ref Range Status   SARS Coronavirus 2 by RT PCR NEGATIVE NEGATIVE Final    Comment: (NOTE) SARS-CoV-2 target nucleic acids are NOT DETECTED. The SARS-CoV-2 RNA is generally detectable in upper respiratoy specimens during the acute phase of infection. The  lowest concentration of SARS-CoV-2 viral copies this assay can detect is 131 copies/mL. A negative result does not preclude SARS-Cov-2 infection and should not be used as the sole basis for treatment or other patient management decisions. A negative result may occur with  improper specimen collection/handling, submission of specimen other than nasopharyngeal swab, presence of viral mutation(s) within the areas targeted by this assay, and inadequate number of viral copies (<131 copies/mL). A negative result must be combined with clinical observations, patient history, and epidemiological information. The expected result is Negative. Fact Sheet for Patients:  PinkCheek.be Fact Sheet for Healthcare Providers:  GravelBags.it This test is not yet ap proved or cleared by the [place] FDA and  has been authorized for detection and/or diagnosis of SARS-CoV-2 by FDA under an Emergency Use Authorization (EUA). This EUA will remain  in effect (meaning this test can be used) for the duration of the COVID-19 declaration under Section 564(b)(1) of the Act, 21 U.S.C. section 360bbb-3(b)(1), unless the authorization is terminated or revoked sooner.    Influenza A by PCR NEGATIVE NEGATIVE Final   Influenza B by PCR NEGATIVE NEGATIVE Final    Comment: (NOTE) The Xpert Xpress SARS-CoV-2/FLU/RSV assay is intended as an aid in  the diagnosis of influenza from Nasopharyngeal swab specimens and  should not be used as a sole basis for treatment. Nasal washings and  aspirates are unacceptable for Xpert Xpress SARS-CoV-2/FLU/RSV  testing. Fact Sheet for Patients: PinkCheek.be Fact Sheet for Healthcare Providers: GravelBags.it This test is not yet approved or cleared by the [place] FDA and  has been authorized for detection and/or diagnosis of SARS-CoV-2 by  FDA under an Emergency  Use Authorization (EUA). This EUA will remain  in effect (meaning this test can be used) for the duration of the  Covid-19 declaration under Section 564(b)(1) of the Act, 21  U.S.C. section 360bbb-3(b)(1), unless the authorization is  terminated or revoked. Performed at [facility], [place] [address] [place], [place] [address]      Radiology Studies: No results found.  Scheduled Meds: . albuterol  2 puff Inhalation Q6H  . amLODipine  10 mg Oral Daily  . dexamethasone  6 mg Oral Q24H  . enoxaparin (LOVENOX) injection  40 mg Subcutaneous Q24H  . senna  1 tablet Oral QHS   Continuous Infusions: . cefTRIAXone (ROCEPHIN)  IV 1 g (06/16/19 1536)     LOS: 5 days    Time spent:     Esmee Fallaw Harmon Pier, MD [facility]   If 7PM-7AM,  please contact night-coverage

## 2019-06-16 NOTE — Plan of Care (Signed)

## 2019-06-17 LAB — BASIC METABOLIC PANEL
Anion gap: 9 (ref 5–15)
BUN: 19 mg/dL (ref 6–20)
CO2: 24 mmol/L (ref 22–32)
Calcium: 8.4 mg/dL — ABNORMAL LOW (ref 8.9–10.3)
Chloride: 104 mmol/L (ref 98–111)
Creatinine, Ser: 0.82 mg/dL (ref 0.44–1.00)
GFR calc Af Amer: >60 mL/min (ref >60)
GFR calc non Af Amer: >60 mL/min (ref >60)
Glucose, Bld: 142 mg/dL — ABNORMAL HIGH (ref 70–99)
Potassium: 4.1 mmol/L (ref 3.5–5.1)
Sodium: 137 mmol/L (ref 135–145)

## 2019-06-17 LAB — CBC
HCT: 38.9 % (ref 36.0–46.0)
Hemoglobin: 12 g/dL (ref 12.0–15.0)
MCH: 27.5 pg (ref 26.0–34.0)
MCHC: 30.8 g/dL (ref 30.0–36.0)
MCV: 89.2 fL (ref 80.0–100.0)
Platelets: 348 10*3/uL (ref 150–400)
RBC: 4.36 MIL/uL (ref 3.87–5.11)
RDW: 14.1 % (ref 11.5–15.5)
WBC: 7.1 10*3/uL (ref 4.0–10.5)
nRBC: 0 % (ref 0.0–0.2)

## 2019-06-17 LAB — MAGNESIUM: Magnesium: 2.3 mg/dL (ref 1.7–2.4)

## 2019-06-17 MED ORDER — GUAIFENESIN-DM 100-10 MG/5ML PO SYRP: 10.0000 mL | ORAL_SOLUTION | ORAL | 0 refills | Status: DC | PRN

## 2019-06-17 MED ORDER — ALBUTEROL SULFATE HFA 108 (90 BASE) MCG/ACT IN AERS: 2.0000 | INHALATION_SPRAY | Freq: Four times a day (QID) | RESPIRATORY_TRACT | 0 refills | Status: DC | PRN

## 2019-06-17 MED ORDER — SENNA 8.6 MG PO TABS: 1.0000 | ORAL_TABLET | Freq: Every day | ORAL | 0 refills | Status: DC

## 2019-06-17 MED ORDER — DOCUSATE SODIUM 100 MG PO CAPS: 100.0000 mg | ORAL_CAPSULE | Freq: Two times a day (BID) | ORAL | 0 refills | Status: DC | PRN

## 2019-06-17 MED ORDER — ASPIRIN EC 81 MG PO TBEC: 81.0000 mg | DELAYED_RELEASE_TABLET | Freq: Every day | ORAL | 2 refills | Status: AC

## 2019-06-17 MED ORDER — DEXAMETHASONE 6 MG PO TABS: 6.0000 mg | ORAL_TABLET | ORAL | 0 refills | Status: AC

## 2019-06-17 MED ORDER — CEFDINIR 300 MG PO CAPS: 300.0000 mg | ORAL_CAPSULE | Freq: Two times a day (BID) | ORAL | 0 refills | Status: AC

## 2019-06-17 NOTE — Plan of Care (Signed)
Discharge instructions reviewed with patient, questions answered, verbalized understanding.  Patient awaiting husband for transport home.

## 2019-06-17 NOTE — Discharge Summary (Signed)
Physician Discharge Summary  Lynn Estrada [id number] DOB: Feb 02, 1961 DOA: 06/11/2019  PCP: [name], PA  Admit date: 06/11/2019 Discharge date: 06/17/2019  Admitted From:   Disposition:    Recommendations for Outpatient Follow-up:  1. Follow up with PCP in 1-2 weeks 2. Please obtain BMP/CBC in one week 3. Please follow up on the following pending results:  Home Health:  Equipment/Devices:  Discharge Condition: stable   CODE STATUS: full   Diet recommendation: Heart Healthy     Brief/Interim Summary:  Lynn D Littleis a 58 y.o.femalewith medical history significant ofHTN, presents to the ED with ~2 week history of body aches, fatigue, cough, extensive vomiting, chills, and fever up to 100 at home. Pt has multiple sick contacts: multiple family members who she saw over thanksgiving have not only similar symptoms but have been formally diagnosed with COVID-19. She had a COVID test on 12/2, which came back "indeterminate". RVP today is negative for COVID as well as influenza. In the ED, Tm 99.7. Creat 1.26. D.dimer 1.36, satting mid 80s on RA, improves to mid 90s on 3L O2 via [place]. CXR shows multifocal pneumonia that would be C/W COVID. Patient admitted for further management  In spite of 2 - COVID-19 test, given strong suspicion for COVID-19 pneumonia, she was started on remdesivir and Decadron and finished a course of azithromycin.  IV Rocephin was switched to cefdinir to finish a total of 7-day antibiotic course.  Her cough was symptomatically treated.  She finished 5-day course of remdesivir without any side effects.  On December 16, she was breathing fine on room air.  Did not have any complaints and was felt to be at baseline.  So she was discharged home to follow-up with PCP in 1 week.  She has also been given COVID-19 instructions and return to work note from next Monday with resumption of light duties and advance as tolerated.pt advised to take asa 81 mg  for dvt ppx due to covid   During her stay  CAP vs suspected Covid 19 virus pneumonia: Currently afebrile,no leukocytosis On Room air from 2 l now.  Inflammatory markers slightly elevated, procalcitonin negative, D-dimer 1.63 Chest x-ray showed multifocal pneumonia COVID-19 test x2 negative. Will manage as Covid in addition to CAP: Continue remdesivir, Decadron. Finished course of azithromycin.  Switch IV Rocephin to cefdinir Supplemental oxygen as needed, cough suppressant, vitamins Trend inflammatory markers Monitor closely  Hypertension Continue amlodipine  DVT prophylaxis: Lovenox Code Status:   Full code  Discharge Diagnoses:  Principal Problem:   Suspected COVID-19 virus infection Active Problems:   Essential hypertension   Acute respiratory failure with hypoxia ([place])   Multifocal pneumonia    Discharge Instructions  Discharge Instructions    Diet - low sodium heart healthy   Complete by: As directed    Increase activity slowly   Complete by: As directed      Allergies as of 06/17/2019      Reactions   Bactrim [sulfamethoxazole-trimethoprim] Rash   Latex Rash      Medication List    TAKE these medications   albuterol 108 (90 Base) MCG/ACT inhaler Commonly known as: VENTOLIN HFA Inhale 2 puffs into the lungs every 6 (six) hours as needed for wheezing or shortness of breath.   amLODipine 10 MG tablet Commonly known as: NORVASC TAKE 1 TABLET (10 MG TOTAL) BY MOUTH DAILY.   cefdinir 300 MG capsule Commonly known as: OMNICEF Take 1 capsule (300 mg total) by mouth every 12 (twelve)  hours for 2 days.   dexamethasone 6 MG tablet Commonly known as: DECADRON Take 1 tablet (6 mg total) by mouth daily for 5 days. Start taking on: June 18, 2019   docusate sodium 100 MG capsule Commonly known as: COLACE Take 1 capsule (100 mg total) by mouth 2 (two) times daily as needed for mild constipation.   guaiFENesin-dextromethorphan 100-10 MG/5ML  syrup Commonly known as: ROBITUSSIN DM Take 10 mLs by mouth every 4 (four) hours as needed for cough.   MULTIVITAMIN ADULT PO Take 1 tablet by mouth daily.   senna 8.6 MG Tabs tablet Commonly known as: SENOKOT Take 1 tablet (8.6 mg total) by mouth at bedtime.   Vitamin D (Ergocalciferol) 1.25 MG (50000 UT) Caps capsule Commonly known as: DRISDOL Take 50,000 Units by mouth once a week.       Allergies  Allergen Reactions  . Bactrim [Sulfamethoxazole-Trimethoprim] Rash  . Latex Rash    Consultations:     Procedures/Studies: DG Chest Portable 1 View  Result Date: 06/11/2019 CLINICAL DATA:  Weakness, cough. Additional history provided: COVID (+) EXAM: PORTABLE CHEST 1 VIEW COMPARISON:  Chest radiograph 09/10/2017 FINDINGS: Unchanged cardiomegaly.  Aortic atherosclerosis. Ill-defined opacities within the left mid lung and bilateral lung bases. No pleural effusion or evidence of pneumothorax. No acute bony abnormality. Overlying cardiac monitoring leads. IMPRESSION: Ill-defined airspace opacities within the left mid lung and bilateral lung bases, likely reflecting multifocal pneumonia given provided history. Radiographic follow-up to resolution recommended. Unchanged cardiomegaly.  Aortic atherosclerosis. Electronically Signed   By: Kellie Simmering DO   On: 06/11/2019 16:59       Subjective:   Discharge Exam: Vitals:   06/17/19 0535 06/17/19 0758  BP: (!) 138/96   Pulse: (!) 55   Resp: 20   Temp: 98.2 F (36.8 C)   SpO2: 98% 96%   Vitals:   06/16/19 1528 06/16/19 2105 06/17/19 0535 06/17/19 0758  BP: 112/76 117/67 (!) 138/96   Pulse: 70 72 (!) 55   Resp: 18 18 20    Temp: 98.8 F (37.1 C) 98.3 F (36.8 C) 98.2 F (36.8 C)   TempSrc: Oral Oral Oral   SpO2: 97% 96% 98% 96%  Weight:      Height:        General: Pt is alert, awake, not in acute distress Cardiovascular: RRR, S1/S2 +, no rubs, no gallops Respiratory: CTA bilaterally, no wheezing, no  rhonchi Abdominal: Soft, NT, ND, bowel sounds + Extremities: no edema, no cyanosis    The results of significant diagnostics from this hospitalization (including imaging, microbiology, ancillary and laboratory) are listed below for reference.     Microbiology: Recent Results (from the past 240 hour(s))  Novel Coronavirus, NAA ([facility])     Status: Abnormal   Collection Time: 06/10/19 12:00 AM   Specimen: Nasopharyngeal(NP) swabs in vial transport medium   NASOPHARYNGE  TESTING  Result Value Ref Range Status   SARS-CoV-2, NAA Detected (A) Not Detected Final    Comment: This nucleic acid amplification test was developed and its performance characteristics determined by [employer]. Nucleic acid amplification tests include PCR and TMA. This test has not been FDA cleared or approved. This test has been authorized by FDA under an Emergency Use Authorization (EUA). This test is only authorized for the duration of time the declaration that circumstances exist justifying the authorization of the emergency use of in vitro diagnostic tests for detection of SARS-CoV-2 virus and/or diagnosis of COVID-19 infection under section 564(b)(1) of the  Act, 21 U.S.C. [id number]) (1), unless the authorization is terminated or revoked sooner. When diagnostic testing is negative, the possibility of a false negative result should be considered in the context of a patient's recent exposures and the presence of clinical signs and symptoms consistent with COVID-19. An individual without symptoms of COVID-19 and who is not shedding SARS-CoV-2 virus would  expect to have a negative (not detected) result in this assay.   Respiratory Panel by RT PCR (Flu A&B, Covid) - Nasopharyngeal Swab     Status: None   Collection Time: 06/11/19  5:36 PM   Specimen: Nasopharyngeal Swab  Result Value Ref Range Status   SARS Coronavirus 2 by RT PCR NEGATIVE NEGATIVE Final    Comment: (NOTE) SARS-CoV-2 target  nucleic acids are NOT DETECTED. The SARS-CoV-2 RNA is generally detectable in upper respiratoy specimens during the acute phase of infection. The lowest concentration of SARS-CoV-2 viral copies this assay can detect is 131 copies/mL. A negative result does not preclude SARS-Cov-2 infection and should not be used as the sole basis for treatment or other patient management decisions. A negative result may occur with  improper specimen collection/handling, submission of specimen other than nasopharyngeal swab, presence of viral mutation(s) within the areas targeted by this assay, and inadequate number of viral copies (<131 copies/mL). A negative result must be combined with clinical observations, patient history, and epidemiological information. The expected result is Negative. Fact Sheet for Patients:  PinkCheek.be Fact Sheet for Healthcare Providers:  GravelBags.it This test is not yet ap proved or cleared by the [place] FDA and  has been authorized for detection and/or diagnosis of SARS-CoV-2 by FDA under an Emergency Use Authorization (EUA). This EUA will remain  in effect (meaning this test can be used) for the duration of the COVID-19 declaration under Section 564(b)(1) of the Act, 21 U.S.C. section 360bbb-3(b)(1), unless the authorization is terminated or revoked sooner.    Influenza A by PCR NEGATIVE NEGATIVE Final   Influenza B by PCR NEGATIVE NEGATIVE Final    Comment: (NOTE) The Xpert Xpress SARS-CoV-2/FLU/RSV assay is intended as an aid in  the diagnosis of influenza from Nasopharyngeal swab specimens and  should not be used as a sole basis for treatment. Nasal washings and  aspirates are unacceptable for Xpert Xpress SARS-CoV-2/FLU/RSV  testing. Fact Sheet for Patients: PinkCheek.be Fact Sheet for Healthcare Providers: GravelBags.it This test is not  yet approved or cleared by the [place] FDA and  has been authorized for detection and/or diagnosis of SARS-CoV-2 by  FDA under an Emergency Use Authorization (EUA). This EUA will remain  in effect (meaning this test can be used) for the duration of the  Covid-19 declaration under Section 564(b)(1) of the Act, 21  U.S.C. section 360bbb-3(b)(1), unless the authorization is  terminated or revoked. Performed at [facility], [place] [address], [place], [place] [address]      Labs: BNP (last 3 results) No results for input(s): BNP in the last 8760 hours. Basic Metabolic Panel: Recent Labs  Lab 06/13/19 0245 06/14/19 0237 06/15/19 0252 06/16/19 0322 06/17/19 0321  NA 138 137 137 137 137  K 5.0 4.5 4.0 3.7 4.1  CL 103 104 106 106 104  CO2 25 24 23 23 24   GLUCOSE 148* 139* 135* 127* 142*  BUN 25* 24* 22* 20 19  CREATININE 1.13* 0.83 0.72 0.75 0.82  CALCIUM 8.5* 8.4* 8.5* 8.1* 8.4*  MG  --   --   --   --  2.3   Liver Function Tests: Recent Labs  Lab 06/12/19 0249 06/13/19 0245 06/14/19 0237 06/15/19 0252 06/16/19 0322  AST 30 27 21 21 20   ALT 27 25 22 23 26   ALKPHOS 61 51 51 49 48  BILITOT 0.5 0.6 0.8 0.4 0.8  PROT 7.6 7.3 6.9 6.9 6.6  ALBUMIN 3.5 3.4* 3.4* 3.1* 3.2*   Recent Labs  Lab 06/11/19 1622  LIPASE 66*   No results for input(s): AMMONIA in the last 168 hours. CBC: Recent Labs  Lab 06/12/19 0249 06/13/19 0245 06/14/19 0237 06/15/19 0252 06/16/19 0322 06/17/19 0321  WBC 3.4* 4.6 4.8 4.8 6.3 7.1  NEUTROABS 2.7 3.3 3.4 3.0 3.5  --   HGB 11.6* 11.5* 10.9* 11.0* 11.0* 12.0  HCT 38.0 38.2 35.0* 35.5* 35.2* 38.9  MCV 90.3 91.4 88.2 87.7 88.2 89.2  PLT 249 231 296 316 316 348   Cardiac Enzymes: No results for input(s): CKTOTAL, CKMB, CKMBINDEX, TROPONINI in the last 168 hours. BNP: Invalid input(s): POCBNP CBG: No results for input(s): GLUCAP in the last 168 hours. D-Dimer Recent Labs    06/15/19 0252 06/16/19 0322  DDIMER  0.55* 0.59*   Hgb A1c No results for input(s): HGBA1C in the last 72 hours. Lipid Profile No results for input(s): CHOL, HDL, LDLCALC, TRIG, CHOLHDL, LDLDIRECT in the last 72 hours. Thyroid function studies No results for input(s): TSH, T4TOTAL, T3FREE, THYROIDAB in the last 72 hours.  Invalid input(s): FREET3 Anemia work up No results for input(s): VITAMINB12, FOLATE, FERRITIN, TIBC, IRON, RETICCTPCT in the last 72 hours. Urinalysis    Component Value Date/Time   COLORURINE YELLOW 06/11/2019 1314   APPEARANCEUR HAZY (A) 06/11/2019 1314   LABSPEC 1.016 06/11/2019 1314   PHURINE 5.0 06/11/2019 1314   GLUCOSEU NEGATIVE 06/11/2019 1314   HGBUR NEGATIVE 06/11/2019 1314   BILIRUBINUR NEGATIVE 06/11/2019 1314   BILIRUBINUR negative 12/07/2015 1226   KETONESUR 20 (A) 06/11/2019 1314   PROTEINUR 30 (A) 06/11/2019 1314   UROBILINOGEN 0.2 12/07/2015 1226   NITRITE NEGATIVE 06/11/2019 1314   LEUKOCYTESUR TRACE (A) 06/11/2019 1314   Sepsis Labs Invalid input(s): PROCALCITONIN,  WBC,  LACTICIDVEN Microbiology Recent Results (from the past 240 hour(s))  Novel Coronavirus, NAA ([facility])     Status: Abnormal   Collection Time: 06/10/19 12:00 AM   Specimen: Nasopharyngeal(NP) swabs in vial transport medium   NASOPHARYNGE  TESTING  Result Value Ref Range Status   SARS-CoV-2, NAA Detected (A) Not Detected Final    Comment: This nucleic acid amplification test was developed and its performance characteristics determined by [employer], [employer] and [employer]. Nucleic acid amplification tests include PCR and TMA. This test has not been FDA cleared or approved. This test has been authorized by FDA under an Emergency Use Authorization (EUA). This test is only authorized for the duration of time the declaration that circumstances exist justifying the authorization of the emergency use of in vitro diagnostic tests for detection of SARS-CoV-2 virus and/or diagnosis of COVID-19 infection under section 564(b)(1)  of the Act, 21 U.S.C. [id number]) (1), unless the authorization is terminated or revoked sooner. When diagnostic testing is negative, the possibility of a false negative result should be considered in the context of a patient's recent exposures and the presence of clinical signs and symptoms consistent with COVID-19. An individual without symptoms of COVID-19 and who is not shedding SARS-CoV-2 virus would  expect to have a negative (not detected) result in this assay.   Respiratory Panel by RT PCR (Flu A&B, Covid) - Nasopharyngeal  Swab     Status: None   Collection Time: 06/11/19  5:36 PM   Specimen: Nasopharyngeal Swab  Result Value Ref Range Status   SARS Coronavirus 2 by RT PCR NEGATIVE NEGATIVE Final    Comment: (NOTE) SARS-CoV-2 target nucleic acids are NOT DETECTED. The SARS-CoV-2 RNA is generally detectable in upper respiratoy specimens during the acute phase of infection. The lowest concentration of SARS-CoV-2 viral copies this assay can detect is 131 copies/mL. A negative result does not preclude SARS-Cov-2 infection and should not be used as the sole basis for treatment or other patient management decisions. A negative result may occur with  improper specimen collection/handling, submission of specimen other than nasopharyngeal swab, presence of viral mutation(s) within the areas targeted by this assay, and inadequate number of viral copies (<131 copies/mL). A negative result must be combined with clinical observations, patient history, and epidemiological information. The expected result is Negative. Fact Sheet for Patients:  PinkCheek.be Fact Sheet for Healthcare Providers:  GravelBags.it This test is not yet ap proved or cleared by the [place] FDA and  has been authorized for detection and/or diagnosis of SARS-CoV-2 by FDA under an Emergency Use Authorization (EUA). This EUA will remain  in effect (meaning  this test can be used) for the duration of the COVID-19 declaration under Section 564(b)(1) of the Act, 21 U.S.C. section 360bbb-3(b)(1), unless the authorization is terminated or revoked sooner.    Influenza A by PCR NEGATIVE NEGATIVE Final   Influenza B by PCR NEGATIVE NEGATIVE Final    Comment: (NOTE) The Xpert Xpress SARS-CoV-2/FLU/RSV assay is intended as an aid in  the diagnosis of influenza from Nasopharyngeal swab specimens and  should not be used as a sole basis for treatment. Nasal washings and  aspirates are unacceptable for Xpert Xpress SARS-CoV-2/FLU/RSV  testing. Fact Sheet for Patients: PinkCheek.be Fact Sheet for Healthcare Providers: GravelBags.it This test is not yet approved or cleared by the [place] FDA and  has been authorized for detection and/or diagnosis of SARS-CoV-2 by  FDA under an Emergency Use Authorization (EUA). This EUA will remain  in effect (meaning this test can be used) for the duration of the  Covid-19 declaration under Section 564(b)(1) of the Act, 21  U.S.C. section 360bbb-3(b)(1), unless the authorization is  terminated or revoked. Performed at [facility], [place] [address], [place], [place] [address]      Time coordinating discharge: Over 30 minutes  SIGNED:   Vicenta Dunning, MD  [facility] 06/17/2019, 12:18 PM Pager   If 7PM-7AM, please contact night-coverage [email or web address] Password [email or web address]

## 2019-06-19 DIAGNOSIS — J9601 Acute respiratory failure with hypoxia: Secondary | ICD-10-CM | POA: Diagnosis not present

## 2019-06-19 DIAGNOSIS — Z20828 Contact with and (suspected) exposure to other viral communicable diseases: Secondary | ICD-10-CM | POA: Diagnosis not present

## 2019-06-19 DIAGNOSIS — J189 Pneumonia, unspecified organism: Secondary | ICD-10-CM | POA: Diagnosis not present

## 2019-06-23 DIAGNOSIS — J9601 Acute respiratory failure with hypoxia: Secondary | ICD-10-CM | POA: Diagnosis not present

## 2019-06-23 DIAGNOSIS — J189 Pneumonia, unspecified organism: Secondary | ICD-10-CM | POA: Diagnosis not present

## 2019-06-28 ENCOUNTER — Emergency Department (HOSPITAL_COMMUNITY)
Admission: EM | Admit: 2019-06-28 | Discharge: 2019-06-29 | Disposition: A | Discharge status: Home / Self Care | Payer: BC Managed Care – PPO | Attending: Emergency Medicine | Admitting: Emergency Medicine

## 2019-06-28 ENCOUNTER — Encounter (HOSPITAL_COMMUNITY): Payer: Self-pay

## 2019-06-28 ENCOUNTER — Other Ambulatory Visit: Payer: Self-pay

## 2019-06-28 DIAGNOSIS — Z87891 Personal history of nicotine dependence: Secondary | ICD-10-CM | POA: Insufficient documentation

## 2019-06-28 DIAGNOSIS — I10 Essential (primary) hypertension: Secondary | ICD-10-CM | POA: Diagnosis not present

## 2019-06-28 DIAGNOSIS — R05 Cough: Secondary | ICD-10-CM | POA: Diagnosis not present

## 2019-06-28 DIAGNOSIS — U071 COVID-19: Secondary | ICD-10-CM | POA: Diagnosis not present

## 2019-06-28 DIAGNOSIS — Z91040 Latex allergy status: Secondary | ICD-10-CM | POA: Insufficient documentation

## 2019-06-28 DIAGNOSIS — R109 Unspecified abdominal pain: Secondary | ICD-10-CM | POA: Diagnosis not present

## 2019-06-28 DIAGNOSIS — Z79899 Other long term (current) drug therapy: Secondary | ICD-10-CM | POA: Diagnosis not present

## 2019-06-28 DIAGNOSIS — R509 Fever, unspecified: Secondary | ICD-10-CM | POA: Diagnosis not present

## 2019-06-28 NOTE — ED Triage Notes (Signed)
Pt coming in c/o fever of 103.6 (last took tylenol at 4pm), nausea, weakness, loss of smell/taste, fatigue and chills.

## 2019-06-29 ENCOUNTER — Emergency Department (HOSPITAL_COMMUNITY): Payer: BC Managed Care – PPO

## 2019-06-29 DIAGNOSIS — R05 Cough: Secondary | ICD-10-CM | POA: Diagnosis not present

## 2019-06-29 DIAGNOSIS — R509 Fever, unspecified: Secondary | ICD-10-CM | POA: Diagnosis not present

## 2019-06-29 LAB — CBC WITH DIFFERENTIAL/PLATELET
Abs Immature Granulocytes: 0.02 10*3/uL (ref 0.00–0.07)
Basophils Absolute: 0 10*3/uL (ref 0.0–0.1)
Basophils Relative: 0 %
Eosinophils Absolute: 0 10*3/uL (ref 0.0–0.5)
Eosinophils Relative: 0 %
HCT: 32.7 % — ABNORMAL LOW (ref 36.0–46.0)
Hemoglobin: 10.5 g/dL — ABNORMAL LOW (ref 12.0–15.0)
Immature Granulocytes: 0 %
Lymphocytes Relative: 28 %
Lymphs Abs: 1.5 10*3/uL (ref 0.7–4.0)
MCH: 28.2 pg (ref 26.0–34.0)
MCHC: 32.1 g/dL (ref 30.0–36.0)
MCV: 87.9 fL (ref 80.0–100.0)
Monocytes Absolute: 0.9 10*3/uL (ref 0.1–1.0)
Monocytes Relative: 16 %
Neutro Abs: 3.1 10*3/uL (ref 1.7–7.7)
Neutrophils Relative %: 56 %
Platelets: 200 10*3/uL (ref 150–400)
RBC: 3.72 MIL/uL — ABNORMAL LOW (ref 3.87–5.11)
RDW: 15.5 % (ref 11.5–15.5)
WBC: 5.5 10*3/uL (ref 4.0–10.5)
nRBC: 0 % (ref 0.0–0.2)

## 2019-06-29 LAB — COMPREHENSIVE METABOLIC PANEL
ALT: 43 U/L (ref 0–44)
AST: 51 U/L — ABNORMAL HIGH (ref 15–41)
Albumin: 3.2 g/dL — ABNORMAL LOW (ref 3.5–5.0)
Alkaline Phosphatase: 46 U/L (ref 38–126)
Anion gap: 9 (ref 5–15)
BUN: 19 mg/dL (ref 6–20)
CO2: 24 mmol/L (ref 22–32)
Calcium: 8 mg/dL — ABNORMAL LOW (ref 8.9–10.3)
Chloride: 101 mmol/L (ref 98–111)
Creatinine, Ser: 1.25 mg/dL — ABNORMAL HIGH (ref 0.44–1.00)
GFR calc Af Amer: 55 mL/min — ABNORMAL LOW (ref >60)
GFR calc non Af Amer: 48 mL/min — ABNORMAL LOW (ref >60)
Glucose, Bld: 120 mg/dL — ABNORMAL HIGH (ref 70–99)
Potassium: 4.4 mmol/L (ref 3.5–5.1)
Sodium: 134 mmol/L — ABNORMAL LOW (ref 135–145)
Total Bilirubin: 1.5 mg/dL — ABNORMAL HIGH (ref 0.3–1.2)
Total Protein: 6.8 g/dL (ref 6.5–8.1)

## 2019-06-29 LAB — LACTIC ACID, PLASMA
Lactic Acid, Venous: 0.7 mmol/L (ref 0.5–1.9)
Lactic Acid, Venous: 0.5 mmol/L (ref 0.5–1.9)

## 2019-06-29 LAB — LIPASE, BLOOD: Lipase: 51 U/L (ref 11–51)

## 2019-06-29 MED ORDER — SODIUM CHLORIDE 0.9 % IV BOLUS
1000.0000 mL | Freq: Once | INTRAVENOUS | Status: AC
  Administered 2019-06-29: 1000 mL via INTRAVENOUS

## 2019-06-29 MED ORDER — ONDANSETRON HCL 4 MG/2ML IJ SOLN
4.0000 mg | Freq: Once | INTRAMUSCULAR | Status: AC
  Administered 2019-06-29: 4 mg via INTRAVENOUS
  Filled 2019-06-29: qty 2

## 2019-06-29 MED ORDER — ONDANSETRON 4 MG PO TBDP: ORAL_TABLET | ORAL | 0 refills | Status: DC

## 2019-06-29 MED ORDER — ACETAMINOPHEN 500 MG PO TABS
1000.0000 mg | ORAL_TABLET | Freq: Once | ORAL | Status: AC
  Administered 2019-06-29: 1000 mg via ORAL
  Filled 2019-06-29: qty 2

## 2019-06-29 NOTE — ED Notes (Signed)
Full rainbow and 1 set of blood cultures sent to lab

## 2019-06-29 NOTE — ED Notes (Signed)
Patient was verbalized discharge instructions. Pt had no further questions at this time. NAD. 

## 2019-06-29 NOTE — ED Provider Notes (Signed)
[facility] DEPT Provider Note   CSN: [id number] Arrival date & time: 06/28/19  2047     History Chief Complaint  Patient presents with  . Fever  . Generalized Body Aches    Lynn Estrada is a 58 y.o. female with a hx of GERD, HTN presents to the Emergency Department complaining of gradual, persistent, progressively worsening generalized weakness onset 2-3 days ago. Associated symptoms include fever, diaphoresis, generalized abd pain, nausea and dry heaving (no vomiting), cough, loss of taste and smell. Pt reports she was hospitalized for pneumonia and felt well after discharge until several days ago.  Pt reports taking tylenol which helps with her fever. She is also using her albuterol inhaler and nebulizer for shortness of breath.  Pt reports decreased PO intake.  Nothing makes her symptoms worse.  Pt reports BM yesterday that was very hard.  She has had some constipation in the last few weeks. Pt denies syncope, urinary symptoms, vaginal symptoms.  Denies known sick contacts.  Records reviewed.  Pt was hospitalized for CAP vs COVID several weeks ago and discharged on 06/17/2019.  She was improved at that time and did not require supplemental oxygen.  Patient had positive Covid test on 06/10/2019.  She was treated as such during her inpatient.   The history is provided by the patient and medical records. No language interpreter was used.       Past Medical History:  Diagnosis Date  . Anemia   . Eczema   . GERD (gastroesophageal reflux disease)   . History of adenomatous polyp of colon    08-03-2016  tubular adenoma x2 and hyperplastic  . History of chronic gastritis 08/03/2016  . History of ectopic pregnancy 1988   s/p  left salpingectomy  . History of endometriosis   . History of kidney stones   . History of partial nephrectomy    right pelvis for very large stone  . History of pneumonia 07/02/2016   CAP  . History of sepsis    07-01-2016   sepsis w/ pyelonephritis, CAP /   12-31-2016  urosepsis w/ kidney stone obstruction  . History of uterine leiomyoma   . Hypertension   . Left ureteral stone   . Nephrolithiasis    left obstructive stone and right non-obstructive stone  per CT 12-31-2016  . Urgency of urination     Patient Active Problem List   Diagnosis Date Noted  . Suspected COVID-19 virus infection 06/11/2019  . Acute respiratory failure with hypoxia ([place] [facility]) 06/11/2019  . Multifocal pneumonia 06/11/2019  . Ureteral stone with hydronephrosis 12/31/2016  . Anemia 07/10/2016  . Essential hypertension 07/26/2015  . Eczema 07/26/2015  . BMI 29.0-29.9,adult 07/26/2015    Past Surgical History:  Procedure Laterality Date  . ABDOMINAL HYSTERECTOMY  01/20/2007   w/ Lysis Adhesions/  Left salpingoophorectomy/  Right Salpingectomy  . CHOLECYSTECTOMY  1994   and Right Partial Nephrectomy (pelvis for very large stone)  . CYSTOSCOPY W/ URETERAL STENT PLACEMENT Left 12/31/2016   Procedure: CYSTOSCOPY WITH RETROGRADE PYELOGRAM/ LEFT URETERAL STENT PLACEMENT;  Surgeon: Alexis Frock, MD;  Location: [facility];  Service: Urology;  Laterality: Left;  . CYSTOSCOPY WITH RETROGRADE PYELOGRAM, URETEROSCOPY AND STENT PLACEMENT Left 01/18/2017   Procedure: 1ST STAGE CYSTOSCOPY WITH RETROGRADE PYELOGRAM, URETEROSCOPY AND STENT REPLACEMENT;  Surgeon: Alexis Frock, MD;  Location: [facility];  Service: Urology;  Laterality: Left;  . CYSTOSCOPY WITH RETROGRADE PYELOGRAM, URETEROSCOPY AND STENT PLACEMENT Left 02/01/2017   Procedure: 2ND  STAGE CYSTOSCOPY WITH RETROGRADE PYELOGRAM, URETEROSCOPY AND STENT REPLACEMENT;  Surgeon: Alexis Frock, MD;  Location: [facility];  Service: Urology;  Laterality: Left;  . ECTOPIC PREGNANCY SURGERY  1988   Left Salpingectomy  . HOLMIUM LASER APPLICATION Left [id number]   Procedure: HOLMIUM LASER APPLICATION;  Surgeon: Alexis Frock, MD;  Location: [facility];   Service: Urology;  Laterality: Left;  . HOLMIUM LASER APPLICATION Left [id number]   Procedure: HOLMIUM LASER APPLICATION;  Surgeon: Alexis Frock, MD;  Location: [facility];  Service: Urology;  Laterality: Left;  . LUMBAR DISC SURGERY  01/1999   right L5 -- S1  . RE-EXPLORATION LUMBAR/  LAMINECTOMY AND MICRODISKECTOMY  10/14/2001   right L5 -- S1  . TUBAL LIGATION Bilateral 10/17/1999   PPTL  . UMBILICAL HERNIA REPAIR  age 65     OB History   No obstetric history on file.     Family History  Problem Relation Age of Onset  . Hypertension Sister   . Eczema Sister   . Lupus Sister   . Sarcoidosis Mother   . Prostate cancer Father   . Gout Maternal Grandmother   . Diabetes Maternal Grandmother        leg amputations  . Breast cancer Maternal Aunt   . Stomach cancer Neg Hx   . Colon cancer Neg Hx     Social History   Tobacco Use  . Smoking status: Former Smoker    Packs/day: 0.75    Years: 5.00    Pack years: 3.75    Types: Cigarettes    Quit date: 01/10/1997    Years since quitting: 22.4  . Smokeless tobacco: Never Used  Substance Use Topics  . Alcohol use: Yes    Alcohol/week: 0.0 standard drinks    Comment: social use; once a month  . Drug use: No    Home Medications Prior to Admission medications   Medication Sig Start Date End Date Taking? Authorizing Provider  albuterol (VENTOLIN HFA) 108 (90 Base) MCG/ACT inhaler Inhale 2 puffs into the lungs every 6 (six) hours as needed for wheezing or shortness of breath. 06/17/19   Sheth, Devam P, MD  amLODipine (NORVASC) 10 MG tablet TAKE 1 TABLET (10 MG TOTAL) BY MOUTH DAILY. 09/05/17   [name], PA  aspirin EC 81 MG tablet Take 1 tablet (81 mg total) by mouth daily. 06/17/19 07/17/19  Sheth, Vickii Chafe, MD  docusate sodium (COLACE) 100 MG capsule Take 1 capsule (100 mg total) by mouth 2 (two) times daily as needed for mild constipation. 06/17/19   Sheth, Devam P, MD  guaiFENesin-dextromethorphan  (ROBITUSSIN DM) 100-10 MG/5ML syrup Take 10 mLs by mouth every 4 (four) hours as needed for cough. 06/17/19   Sheth, Vickii Chafe, MD  Multiple Vitamins-Minerals (MULTIVITAMIN ADULT PO) Take 1 tablet by mouth daily.    [provider]  ondansetron (ZOFRAN ODT) 4 MG disintegrating tablet 4mg  ODT q4 hours prn nausea/vomit 06/29/19   Demba Nigh, Jarrett Soho, PA-C  senna (SENOKOT) 8.6 MG TABS tablet Take 1 tablet (8.6 mg total) by mouth at bedtime. 06/17/19   Sheth, Vickii Chafe, MD  Vitamin D, Ergocalciferol, (DRISDOL) 1.25 MG (50000 UT) CAPS capsule Take 50,000 Units by mouth once a week. 03/10/19   [provider]    Allergies    Bactrim [sulfamethoxazole-trimethoprim] and Latex  Review of Systems   Review of Systems  Constitutional: Positive for appetite change, chills, diaphoresis and fatigue. Negative for fever and unexpected weight  change.  HENT: Negative for mouth sores.   Eyes: Negative for visual disturbance.  Respiratory: Positive for cough. Negative for chest tightness, shortness of breath and wheezing.   Cardiovascular: Negative for chest pain.  Gastrointestinal: Positive for abdominal pain and nausea. Negative for constipation, diarrhea and vomiting.  Endocrine: Negative for polydipsia, polyphagia and polyuria.  Genitourinary: Negative for dysuria, frequency, hematuria and urgency.  Musculoskeletal: Negative for back pain and neck stiffness.  Skin: Negative for rash.  Allergic/Immunologic: Negative for immunocompromised state.  Neurological: Negative for syncope, light-headedness and headaches.  Hematological: Does not bruise/bleed easily.  Psychiatric/Behavioral: Negative for sleep disturbance. The patient is not nervous/anxious.     Physical Exam Updated Vital Signs BP 100/74 (BP Location: Left Arm)   Pulse 96   Temp (!) 102.9 F (39.4 C) (Oral)   Resp 19   SpO2 99%   Physical Exam Vitals and nursing note reviewed.  Constitutional:      General: She is not in  acute distress.    Appearance: She is not diaphoretic.  HENT:     Head: Normocephalic.  Eyes:     General: No scleral icterus.    Conjunctiva/sclera: Conjunctivae normal.  Cardiovascular:     Rate and Rhythm: Normal rate and regular rhythm.     Pulses: Normal pulses.          Radial pulses are 2+ on the right side and 2+ on the left side.  Pulmonary:     Effort: No tachypnea, accessory muscle usage, prolonged expiration, respiratory distress or retractions.     Breath sounds: No stridor. Examination of the right-lower field reveals rhonchi. Examination of the left-lower field reveals rhonchi. Rhonchi present.     Comments: Equal chest rise. No increased work of breathing. Abdominal:     General: There is no distension.     Palpations: Abdomen is soft.     Tenderness: There is no abdominal tenderness. There is no guarding or rebound.  Musculoskeletal:     Cervical back: Normal range of motion.     Comments: Moves all extremities equally and without difficulty.  Skin:    General: Skin is warm and dry.     Capillary Refill: Capillary refill takes less than 2 seconds.  Neurological:     Mental Status: She is alert.     GCS: GCS eye subscore is 4. GCS verbal subscore is 5. GCS motor subscore is 6.     Comments: Speech is clear and goal oriented.  Psychiatric:        Mood and Affect: Mood normal.     ED Results / Procedures / Treatments   Labs (all labs ordered are listed, but only abnormal results are displayed) Labs Reviewed  CBC WITH DIFFERENTIAL/PLATELET - Abnormal; Notable for the following components:      Result Value   RBC 3.72 (*)    Hemoglobin 10.5 (*)    HCT 32.7 (*)    All other components within normal limits  COMPREHENSIVE METABOLIC PANEL - Abnormal; Notable for the following components:   Sodium 134 (*)    Glucose, Bld 120 (*)    Creatinine, Ser 1.25 (*)    Calcium 8.0 (*)    Albumin 3.2 (*)    AST 51 (*)    Total Bilirubin 1.5 (*)    GFR calc non Af Amer  48 (*)    GFR calc Af Amer 55 (*)    All other components within normal limits  LACTIC ACID, PLASMA  LACTIC  ACID, PLASMA  LIPASE, BLOOD  URINALYSIS, ROUTINE W REFLEX MICROSCOPIC     Radiology DG Chest Port 1 View  Result Date: 06/29/2019 CLINICAL DATA:  Fever cough EXAM: PORTABLE CHEST 1 VIEW COMPARISON:  June 11, 2019 FINDINGS: There is mild cardiomegaly. There is mild prominence of the central pulmonary vasculature. There is interval slight progression in the head patchy/hazy airspace opacity at the left lung base. Mildly increased interstitial markings again noted the right lung base. No acute osseous abnormality. IMPRESSION: Slight interval progression in the patchy airspace opacity at the left lung base which could be due to atelectasis/pneumonia. Electronically Signed   By: Prudencio Pair M.D.   On: 06/29/2019 01:01    Procedures Procedures (including critical care time)  Medications Ordered in ED Medications  acetaminophen (TYLENOL) tablet 1,000 mg (1,000 mg Oral Given 06/29/19 0040)  sodium chloride 0.9 % bolus 1,000 mL (0 mLs Intravenous Stopped 06/29/19 0250)  ondansetron (ZOFRAN) injection 4 mg (4 mg Intravenous Given 06/29/19 0153)    ED Course  I have reviewed the triage vital signs and the nursing notes.  Pertinent labs & imaging results that were available during my care of the patient were reviewed by me and considered in my medical decision making (see chart for details).    MDM Rules/Calculators/A&P                      Lynn Estrada was evaluated in Emergency Department on 06/29/2019 for the symptoms described in the history of present illness. She was evaluated in the context of the global COVID-19 pandemic, which necessitated consideration that the patient might be at risk for infection with the SARS-CoV-2 virus that causes COVID-19. Institutional protocols and algorithms that pertain to the evaluation of patients at risk for COVID-19 are in a state of  rapid change based on information released by regulatory bodies including the CDC and federal and state organizations. These policies and algorithms were followed during the patient's care in the ED.  Patient discharged several weeks ago after admission for Covid pneumonia.  She returns today with fever, chills, cough and other Covid-like symptoms.  Chest x-ray shows some interval worsening of the patchy airspace opacity of the left lung base.  Suspect this is a worsening of her Covid pneumonia.  She is without hypoxia or tachypnea here in the emergency department.  No respiratory distress.  She has fever and myalgias.  Anemia appears to be at baseline.  Creatinine slightly elevated.  I suspect this is secondary to poor p.o. intake.  Fluids given.  3:57 AM Patient's pain has resolved.  Her fever has improved and her vitals have remained stable.  Fluids were given.  She reports she is feeling significantly better.  She is able to tolerate p.o. here in the department.  On repeat exam, her abdomen is soft and nontender.  She is breathing without difficulty, no respiratory distress or sensory muscle usage.  No tachypnea.  Suspect persistent symptoms from Covid.  Patient will continue with symptomatic therapy.  Will give Zofran for nausea.  She is to follow with her primary care in 2 to 3 days for reevaluation.  Discussed reasons to return to the emergency department.   Final Clinical Impression(s) / ED Diagnoses Final diagnoses:  COVID-19 virus infection    Rx / DC Orders ED Discharge Orders         Ordered    ondansetron (ZOFRAN ODT) 4 MG disintegrating tablet     06/29/19  Coward, Roslin Norwood, PA-C 06/29/19 0358    Ward, Delice Bison, DO 06/29/19 0451

## 2019-06-29 NOTE — Discharge Instructions (Signed)
1. Medications: Home prescriptions of albuterol for wheezing and shortness of breath, Colace for constipation, Robitussin for cough.  I will prescribe Zofran for nausea. 2. Treatment: rest, drink plenty of fluids, please take Tylenol and ibuprofen for fever control at home. 3. Follow Up: Please followup with your primary doctor in 2-3 days for discussion of your diagnoses and further evaluation after today's visit; if you do not have a primary care doctor use the resource guide provided to find one; Please return to the ER for worsening pain, worsening shortness of breath, difficulty breathing, weakness or other concerns

## 2019-06-29 NOTE — ED Notes (Signed)
Lab contacted to add on lipase.

## 2019-06-29 NOTE — ED Notes (Signed)
Will place purewick on pt to obtain urine sample.

## 2019-06-29 NOTE — ED Notes (Signed)
Purewick applied for urine.

## 2019-07-01 DIAGNOSIS — R5383 Other fatigue: Secondary | ICD-10-CM | POA: Diagnosis not present

## 2019-07-01 DIAGNOSIS — U071 COVID-19: Secondary | ICD-10-CM | POA: Diagnosis not present

## 2019-07-08 DIAGNOSIS — E559 Vitamin D deficiency, unspecified: Secondary | ICD-10-CM | POA: Diagnosis not present

## 2019-07-08 DIAGNOSIS — J189 Pneumonia, unspecified organism: Secondary | ICD-10-CM | POA: Diagnosis not present

## 2019-10-09 DIAGNOSIS — E559 Vitamin D deficiency, unspecified: Secondary | ICD-10-CM | POA: Diagnosis not present

## 2019-10-09 DIAGNOSIS — R350 Frequency of micturition: Secondary | ICD-10-CM | POA: Diagnosis not present

## 2019-10-09 DIAGNOSIS — L309 Dermatitis, unspecified: Secondary | ICD-10-CM | POA: Diagnosis not present

## 2019-10-22 DIAGNOSIS — N200 Calculus of kidney: Secondary | ICD-10-CM | POA: Diagnosis not present

## 2019-10-23 DIAGNOSIS — M47899 Other spondylosis, site unspecified: Secondary | ICD-10-CM | POA: Insufficient documentation

## 2020-01-20 ENCOUNTER — Other Ambulatory Visit: Payer: Self-pay

## 2020-01-20 ENCOUNTER — Ambulatory Visit: Payer: Self-pay

## 2020-01-20 ENCOUNTER — Other Ambulatory Visit: Payer: Self-pay | Admitting: Family Medicine

## 2020-01-20 DIAGNOSIS — M25561 Pain in right knee: Secondary | ICD-10-CM

## 2020-04-13 DIAGNOSIS — Z1231 Encounter for screening mammogram for malignant neoplasm of breast: Secondary | ICD-10-CM | POA: Diagnosis not present

## 2020-07-20 DIAGNOSIS — D1720 Benign lipomatous neoplasm of skin and subcutaneous tissue of unspecified limb: Secondary | ICD-10-CM | POA: Insufficient documentation

## 2020-07-20 DIAGNOSIS — M79641 Pain in right hand: Secondary | ICD-10-CM | POA: Insufficient documentation

## 2020-07-20 DIAGNOSIS — M189 Osteoarthritis of first carpometacarpal joint, unspecified: Secondary | ICD-10-CM | POA: Insufficient documentation

## 2020-07-20 DIAGNOSIS — M654 Radial styloid tenosynovitis [de Quervain]: Secondary | ICD-10-CM | POA: Insufficient documentation

## 2020-07-24 ENCOUNTER — Other Ambulatory Visit: Payer: Self-pay

## 2020-07-24 ENCOUNTER — Emergency Department (HOSPITAL_COMMUNITY)
Admission: EM | Admit: 2020-07-24 | Discharge: 2020-07-24 | Disposition: A | Discharge status: Home / Self Care | Payer: BC Managed Care – PPO | Attending: Emergency Medicine | Admitting: Emergency Medicine

## 2020-07-24 DIAGNOSIS — R600 Localized edema: Secondary | ICD-10-CM | POA: Insufficient documentation

## 2020-07-24 DIAGNOSIS — R2231 Localized swelling, mass and lump, right upper limb: Secondary | ICD-10-CM

## 2020-07-24 DIAGNOSIS — I10 Essential (primary) hypertension: Secondary | ICD-10-CM | POA: Diagnosis not present

## 2020-07-24 DIAGNOSIS — Z87891 Personal history of nicotine dependence: Secondary | ICD-10-CM | POA: Diagnosis not present

## 2020-07-24 DIAGNOSIS — Z91040 Latex allergy status: Secondary | ICD-10-CM | POA: Diagnosis not present

## 2020-07-24 DIAGNOSIS — Z79899 Other long term (current) drug therapy: Secondary | ICD-10-CM | POA: Diagnosis not present

## 2020-07-24 DIAGNOSIS — M79644 Pain in right finger(s): Secondary | ICD-10-CM | POA: Insufficient documentation

## 2020-07-24 MED ORDER — NAPROXEN 500 MG PO TABS: 500.0000 mg | ORAL_TABLET | Freq: Two times a day (BID) | ORAL | 0 refills | Status: DC

## 2020-07-24 MED ORDER — NAPROXEN 250 MG PO TABS
500.0000 mg | ORAL_TABLET | Freq: Once | ORAL | Status: AC
  Administered 2020-07-24: 500 mg via ORAL
  Filled 2020-07-24: qty 2

## 2020-07-24 NOTE — Discharge Instructions (Addendum)
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  I am sending you home with a pain medication which also helps with inflammation.  Take as needed.  Please follow-up with your orthopedic surgeon on Monday for further evaluation.  Return to the ER for new or worsening symptoms.

## 2020-07-24 NOTE — ED Triage Notes (Signed)
C/O right hand pain and swelling since Thursay, and has had negative XRAYs x 2 since then. Here today d/t persistent swelling and pain.

## 2020-07-24 NOTE — ED Provider Notes (Signed)
[place] EMERGENCY DEPARTMENT Provider Note   CSN: [id number] Arrival date & time: 07/24/20  0847     History Chief Complaint  Patient presents with  . Hand Problem    Lynn Estrada is a 60 y.o. female with a past medical history significant for anemia, GERD, and hypertension who presents to the ED due to persistent right hand edema x1 week.  Patient states edema is intermittent in nature and worse after work.  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  She is right-hand dominant.  Denies fever and chills.  Denies decreased range of motion of thumb and fingers.  Admits to intermittent numbness/tingling of right hand and fingers.  She has a history of carpal tunnel. Edema is located in the thenar eminence. She has been taking ibuprofen with mild improvement in symptoms. Chart reviewed. Patient seen by Dr. 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 Per patient she was offered an MRI and steroid injections which she deferred. Patient was given a splint which she has been using periodically. Pain worse with movement of right thumb. Per patient, she has had 2 x-rays of hand that were unremarkable. Denies any known injury.  History obtained from patient and past medical records. No interpreter used during encounter.      Past Medical History:  Diagnosis Date  . Anemia   . Eczema   . GERD (gastroesophageal reflux disease)   . History of adenomatous polyp of colon    08-03-2016  tubular adenoma x2 and hyperplastic  . History of chronic gastritis 08/03/2016  . History of ectopic pregnancy 1988   s/p  left salpingectomy  . History of endometriosis   . History of kidney stones   . History of partial nephrectomy    right pelvis for very large stone  . History of pneumonia 07/02/2016   CAP  . History of  sepsis    07-01-2016  sepsis w/ pyelonephritis, CAP /   12-31-2016  urosepsis w/ kidney stone obstruction  . History of uterine leiomyoma   . Hypertension   . Left ureteral stone   . Nephrolithiasis    left obstructive stone and right non-obstructive stone  per CT 12-31-2016  . Urgency of urination     Patient Active Problem List   Diagnosis Date Noted  . Suspected COVID-19 virus infection 06/11/2019  . Acute respiratory failure with hypoxia ([place]) 06/11/2019  . Multifocal pneumonia 06/11/2019  . Ureteral stone with hydronephrosis 12/31/2016  . Anemia 07/10/2016  . Essential hypertension 07/26/2015  . Eczema 07/26/2015  . BMI 29.0-29.9,adult 07/26/2015    Past Surgical History:  Procedure Laterality Date  . ABDOMINAL HYSTERECTOMY  01/20/2007   w/ Lysis Adhesions/  Left salpingoophorectomy/  Right Salpingectomy  . CHOLECYSTECTOMY  1994   and Right Partial Nephrectomy (pelvis for very large stone)  . CYSTOSCOPY W/ URETERAL STENT PLACEMENT Left 12/31/2016   Procedure: CYSTOSCOPY WITH RETROGRADE PYELOGRAM/ LEFT URETERAL STENT PLACEMENT;  Surgeon: Alexis Frock, MD;  Location: [facility];  Service: Urology;  Laterality: Left;  . CYSTOSCOPY WITH RETROGRADE PYELOGRAM, URETEROSCOPY AND STENT PLACEMENT Left 01/18/2017   Procedure: 1ST STAGE CYSTOSCOPY WITH RETROGRADE PYELOGRAM, URETEROSCOPY AND STENT REPLACEMENT;  Surgeon: Alexis Frock, MD;  [facility];  Service: Urology;  Laterality: Left;  . CYSTOSCOPY WITH RETROGRADE PYELOGRAM, URETEROSCOPY AND STENT PLACEMENT Left 02/01/2017  Procedure: 2ND STAGE CYSTOSCOPY WITH RETROGRADE PYELOGRAM, URETEROSCOPY AND STENT REPLACEMENT;  Surgeon: Alexis Frock, MD;  Location: [facility];  Service: Urology;  Laterality: Left;  . ECTOPIC PREGNANCY SURGERY  1988   Left Salpingectomy  . HOLMIUM LASER APPLICATION Left 02/08/9832   Procedure: HOLMIUM LASER APPLICATION;  Surgeon: Alexis Frock, MD;  Location: [facility];  Service: Urology;  Laterality: Left;  . HOLMIUM LASER APPLICATION Left 02/01/5052   Procedure: HOLMIUM LASER APPLICATION;  Surgeon: Alexis Frock, MD;  Location: [facility];  Service: Urology;  Laterality: Left;  . LUMBAR DISC SURGERY  01/1999   right L5 -- S1  . RE-EXPLORATION LUMBAR/  LAMINECTOMY AND MICRODISKECTOMY  10/14/2001   right L5 -- S1  . TUBAL LIGATION Bilateral 10/17/1999   PPTL  . UMBILICAL HERNIA REPAIR  age 43     OB History   No obstetric history on file.     Family History  Problem Relation Age of Onset  . Hypertension Sister   . Eczema Sister   . Lupus Sister   . Sarcoidosis Mother   . Prostate cancer Father   . Gout Maternal Grandmother   . Diabetes Maternal Grandmother        leg amputations  . Breast cancer Maternal Aunt   . Stomach cancer Neg Hx   . Colon cancer Neg Hx     Social History   Tobacco Use  . Smoking status: Former Smoker    Packs/day: 0.75    Years: 5.00    Pack years: 3.75    Types: Cigarettes    Quit date: 01/10/1997    Years since quitting: 23.5  . Smokeless tobacco: Never Used  Vaping Use  . Vaping Use: Never used  Substance Use Topics  . Alcohol use: Yes    Alcohol/week: 0.0 standard drinks    Comment: social use; once a month  . Drug use: No    Home Medications Prior to Admission medications   Medication Sig Start Date End Date Taking? Authorizing Provider  naproxen (NAPROSYN) 500 MG tablet Take 1 tablet (500 mg total) by mouth 2 (two) times daily. 07/24/20  Yes Apolonio Cutting, Druscilla Brownie, PA-C  albuterol (VENTOLIN HFA) 108 (90 Base) MCG/ACT inhaler Inhale 2 puffs into the lungs every 6 (six) hours as needed for wheezing or shortness of breath. 06/17/19   Sheth, Devam P, MD  amLODipine (NORVASC) 10 MG tablet TAKE 1 TABLET (10 MG TOTAL) BY MOUTH DAILY. 09/05/17   [name], PA  docusate sodium (COLACE) 100 MG capsule Take 1 capsule (100 mg total) by mouth 2 (two) times daily as needed  for mild constipation. 06/17/19   Sheth, Devam P, MD  guaiFENesin-dextromethorphan (ROBITUSSIN DM) 100-10 MG/5ML syrup Take 10 mLs by mouth every 4 (four) hours as needed for cough. 06/17/19   Sheth, Vickii Chafe, MD  Multiple Vitamins-Minerals (MULTIVITAMIN ADULT PO) Take 1 tablet by mouth daily.    [provider]  ondansetron (ZOFRAN ODT) 4 MG disintegrating tablet 4mg  ODT q4 hours prn nausea/vomit 06/29/19   Muthersbaugh, Jarrett Soho, PA-C  senna (SENOKOT) 8.6 MG TABS tablet Take 1 tablet (8.6 mg total) by mouth at bedtime. 06/17/19   Sheth, Vickii Chafe, MD  Vitamin D, Ergocalciferol, (DRISDOL) 1.25 MG (50000 UT) CAPS capsule Take 50,000 Units by mouth once a week. 03/10/19   [provider]    Allergies    Bactrim [sulfamethoxazole-trimethoprim] and Latex  Review of Systems   Review of Systems  Constitutional:  Negative for chills and fever.  Musculoskeletal: Positive for arthralgias and joint swelling.  Neurological: Positive for numbness.  All other systems reviewed and are negative.   Physical Exam Updated Vital Signs BP 137/71 (BP Location: Left Arm)   Pulse 86   Temp 98.3 F (36.8 C) (Oral)   Resp 16   Ht 6\' 2"  (1.88 m)   Wt 99.8 kg   SpO2 98%   BMI 28.25 kg/m   Physical Exam Vitals and nursing note reviewed.  Constitutional:      General: She is not in acute distress.    Appearance: She is not ill-appearing.  HENT:     Head: Normocephalic.  Eyes:     Pupils: Pupils are equal, round, and reactive to light.  Cardiovascular:     Rate and Rhythm: Normal rate and regular rhythm.     Pulses: Normal pulses.     Heart sounds: Normal heart sounds. No murmur heard. No friction rub. No gallop.   Pulmonary:     Effort: Pulmonary effort is normal.     Breath sounds: Normal breath sounds.  Abdominal:     General: Abdomen is flat. There is no distension.     Palpations: Abdomen is soft.     Tenderness: There is no abdominal tenderness. There is no guarding or rebound.   Musculoskeletal:     Cervical back: Neck supple.     Comments: Edema surrounding right thenar eminence. Full ROM of right thumb and all fingers. Full ROM of wrist. No overlying erythema or warmth. Radial pulse intact.   Skin:    General: Skin is warm and dry.  Neurological:     General: No focal deficit present.     Mental Status: She is alert.  Psychiatric:        Mood and Affect: Mood normal.        Behavior: Behavior normal.     ED Results / Procedures / Treatments   Labs (all labs ordered are listed, but only abnormal results are displayed) Labs Reviewed - No data to display  EKG None  Radiology No results found.  Procedures Procedures (including critical care time)  Medications Ordered in ED Medications  naproxen (NAPROSYN) tablet 500 mg (has no administration in time range)    ED Course  I have reviewed the triage vital signs and the nursing notes.  Pertinent labs & imaging results that were available during my care of the patient were reviewed by me and considered in my medical decision making (see chart for details).    MDM Rules/Calculators/A&P                         60 year old female presents to the ED due to edema and pain to right thumb that has been persistent over the past week.  Patient evaluated by orthopedics and diagnosed with osteoarthritis of right thumb.  Patient offered steroid injections and MRI which patient deferred.  Patient denies fever and chills.  Upon arrival, vitals all within normal limits.  Patient is afebrile, not tachycardic or hypoxic.  Physical exam significant for edema to right thenar eminence with full range of motion of right thumb and all fingers.  Right upper extremity neurovascularly intact.  No overlying warmth or erythema.  Low suspicion for septic arthritis. Per patient she has had 2 previous negative x-rays. No known injury. No further images warranted at this time. Suspect symptoms related to OA given patient's job with  repetitive hand  motions.  Naproxen given here in the ED and prescription given at discharge.  Instructed patient to follow-up with orthopedic surgeon on Monday for further evaluation. Strict ED precautions discussed with patient. Patient states understanding and agrees to plan. Patient discharged home in no acute distress and stable vitals.  Discussed case with Dr. Tyrone Nine who agrees with assessment and plan. Final Clinical Impression(s) / ED Diagnoses Final diagnoses:  Localized swelling of right thumb    Rx / DC Orders ED Discharge Orders         Ordered    naproxen (NAPROSYN) 500 MG tablet  2 times daily        07/24/20 1127           Karie Kirks 07/24/20 [place], DO 07/24/20 1140

## 2021-05-10 ENCOUNTER — Other Ambulatory Visit: Payer: Self-pay | Admitting: Orthopedic Surgery

## 2021-05-10 DIAGNOSIS — M79641 Pain in right hand: Secondary | ICD-10-CM

## 2021-05-11 IMAGING — DX DG CHEST 1V PORT
1 series · 1 of 1 positions shown · non-contrast
Comparison: Chest radiograph 09/10/2017

CLINICAL DATA: Weakness, cough. Additional history provided: COVID
(+)

EXAM:
PORTABLE CHEST 1 VIEW

[chest ap]
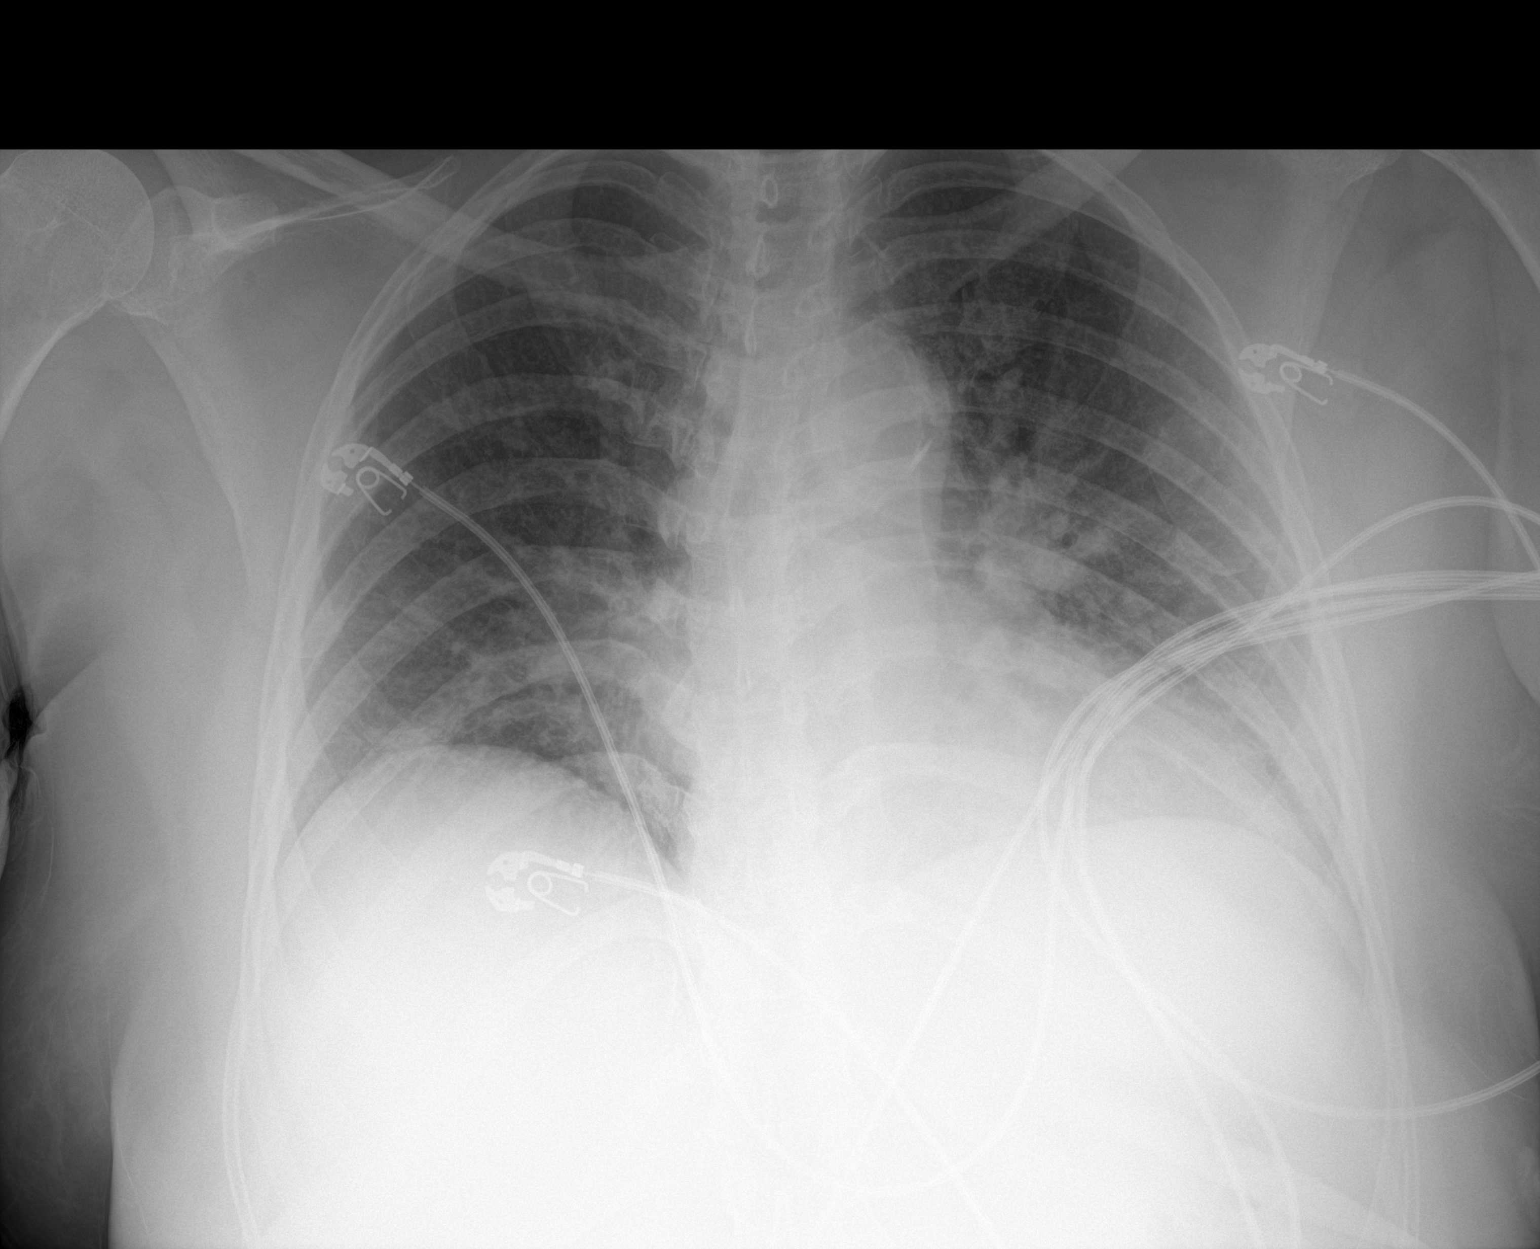

[1 of 1 positions shown; findings below may reference images not displayed]

FINDINGS: Unchanged cardiomegaly.  Aortic atherosclerosis.

Ill-defined opacities within the left mid lung and bilateral lung
bases.

No pleural effusion or evidence of pneumothorax.

No acute bony abnormality.

Overlying cardiac monitoring leads.
IMPRESSION: Ill-defined airspace opacities within the left mid lung and
bilateral lung bases, likely reflecting multifocal pneumonia given
provided history. Radiographic follow-up to resolution recommended.

Unchanged cardiomegaly.  Aortic atherosclerosis.

## 2021-05-29 ENCOUNTER — Ambulatory Visit
Admission: RE | Admit: 2021-05-29 | Discharge: 2021-05-29 | Disposition: A | Discharge status: Home / Self Care | Payer: Self-pay | Source: Ambulatory Visit | Attending: Orthopedic Surgery | Admitting: Orthopedic Surgery

## 2021-05-29 ENCOUNTER — Other Ambulatory Visit: Payer: Self-pay

## 2021-05-29 DIAGNOSIS — M79641 Pain in right hand: Secondary | ICD-10-CM

## 2021-07-12 ENCOUNTER — Other Ambulatory Visit: Payer: Self-pay

## 2021-07-12 ENCOUNTER — Emergency Department (HOSPITAL_COMMUNITY): Payer: BC Managed Care – PPO

## 2021-07-12 ENCOUNTER — Inpatient Hospital Stay (HOSPITAL_COMMUNITY)
Admission: EM | Admit: 2021-07-12 | Discharge: 2021-07-20 | DRG: 286 | Disposition: A | Discharge status: Home Health | Payer: BC Managed Care – PPO | Attending: Internal Medicine | Admitting: Internal Medicine

## 2021-07-12 ENCOUNTER — Encounter (HOSPITAL_COMMUNITY): Payer: Self-pay

## 2021-07-12 DIAGNOSIS — Z6829 Body mass index (BMI) 29.0-29.9, adult: Secondary | ICD-10-CM

## 2021-07-12 DIAGNOSIS — I110 Hypertensive heart disease with heart failure: Secondary | ICD-10-CM | POA: Diagnosis not present

## 2021-07-12 DIAGNOSIS — Z905 Acquired absence of kidney: Secondary | ICD-10-CM

## 2021-07-12 DIAGNOSIS — R0602 Shortness of breath: Secondary | ICD-10-CM | POA: Diagnosis not present

## 2021-07-12 DIAGNOSIS — I447 Left bundle-branch block, unspecified: Secondary | ICD-10-CM | POA: Diagnosis present

## 2021-07-12 DIAGNOSIS — E876 Hypokalemia: Secondary | ICD-10-CM | POA: Diagnosis not present

## 2021-07-12 DIAGNOSIS — E8770 Fluid overload, unspecified: Secondary | ICD-10-CM | POA: Diagnosis present

## 2021-07-12 DIAGNOSIS — J9601 Acute respiratory failure with hypoxia: Secondary | ICD-10-CM | POA: Diagnosis not present

## 2021-07-12 DIAGNOSIS — Z23 Encounter for immunization: Secondary | ICD-10-CM

## 2021-07-12 DIAGNOSIS — I959 Hypotension, unspecified: Secondary | ICD-10-CM | POA: Diagnosis not present

## 2021-07-12 DIAGNOSIS — I252 Old myocardial infarction: Secondary | ICD-10-CM

## 2021-07-12 DIAGNOSIS — Z882 Allergy status to sulfonamides status: Secondary | ICD-10-CM

## 2021-07-12 DIAGNOSIS — E663 Overweight: Secondary | ICD-10-CM | POA: Diagnosis present

## 2021-07-12 DIAGNOSIS — Z8249 Family history of ischemic heart disease and other diseases of the circulatory system: Secondary | ICD-10-CM

## 2021-07-12 DIAGNOSIS — R778 Other specified abnormalities of plasma proteins: Secondary | ICD-10-CM

## 2021-07-12 DIAGNOSIS — R739 Hyperglycemia, unspecified: Secondary | ICD-10-CM | POA: Diagnosis not present

## 2021-07-12 DIAGNOSIS — Z20822 Contact with and (suspected) exposure to covid-19: Secondary | ICD-10-CM | POA: Diagnosis present

## 2021-07-12 DIAGNOSIS — I5021 Acute systolic (congestive) heart failure: Secondary | ICD-10-CM

## 2021-07-12 DIAGNOSIS — I2510 Atherosclerotic heart disease of native coronary artery without angina pectoris: Secondary | ICD-10-CM | POA: Diagnosis present

## 2021-07-12 DIAGNOSIS — E8779 Other fluid overload: Secondary | ICD-10-CM | POA: Diagnosis not present

## 2021-07-12 DIAGNOSIS — Z87891 Personal history of nicotine dependence: Secondary | ICD-10-CM

## 2021-07-12 DIAGNOSIS — Z8719 Personal history of other diseases of the digestive system: Secondary | ICD-10-CM

## 2021-07-12 DIAGNOSIS — Z87442 Personal history of urinary calculi: Secondary | ICD-10-CM

## 2021-07-12 DIAGNOSIS — Z79899 Other long term (current) drug therapy: Secondary | ICD-10-CM

## 2021-07-12 DIAGNOSIS — I10 Essential (primary) hypertension: Secondary | ICD-10-CM | POA: Diagnosis present

## 2021-07-12 DIAGNOSIS — I5041 Acute combined systolic (congestive) and diastolic (congestive) heart failure: Secondary | ICD-10-CM | POA: Diagnosis present

## 2021-07-12 DIAGNOSIS — Z91040 Latex allergy status: Secondary | ICD-10-CM

## 2021-07-12 DIAGNOSIS — D649 Anemia, unspecified: Secondary | ICD-10-CM | POA: Diagnosis present

## 2021-07-12 DIAGNOSIS — Z9851 Tubal ligation status: Secondary | ICD-10-CM

## 2021-07-12 DIAGNOSIS — E669 Obesity, unspecified: Secondary | ICD-10-CM | POA: Diagnosis present

## 2021-07-12 DIAGNOSIS — Z9049 Acquired absence of other specified parts of digestive tract: Secondary | ICD-10-CM

## 2021-07-12 DIAGNOSIS — K219 Gastro-esophageal reflux disease without esophagitis: Secondary | ICD-10-CM | POA: Diagnosis present

## 2021-07-12 DIAGNOSIS — Z86010 Personal history of colonic polyps: Secondary | ICD-10-CM

## 2021-07-12 DIAGNOSIS — I5043 Acute on chronic combined systolic (congestive) and diastolic (congestive) heart failure: Secondary | ICD-10-CM | POA: Diagnosis present

## 2021-07-12 DIAGNOSIS — R7303 Prediabetes: Secondary | ICD-10-CM | POA: Diagnosis present

## 2021-07-12 DIAGNOSIS — R7989 Other specified abnormal findings of blood chemistry: Secondary | ICD-10-CM | POA: Diagnosis present

## 2021-07-12 DIAGNOSIS — Z90711 Acquired absence of uterus with remaining cervical stump: Secondary | ICD-10-CM

## 2021-07-12 LAB — TROPONIN I (HIGH SENSITIVITY)
Troponin I (High Sensitivity): 28 ng/L — ABNORMAL HIGH (ref <18)
Troponin I (High Sensitivity): 25 ng/L — ABNORMAL HIGH (ref <18)

## 2021-07-12 LAB — HEPATIC FUNCTION PANEL
ALT: 39 U/L (ref 0–44)
AST: 24 U/L (ref 15–41)
Albumin: 3.5 g/dL (ref 3.5–5.0)
Alkaline Phosphatase: 49 U/L (ref 38–126)
Bilirubin, Direct: 0.2 mg/dL (ref 0.0–0.2)
Indirect Bilirubin: 1 mg/dL — ABNORMAL HIGH (ref 0.3–0.9)
Total Bilirubin: 1.2 mg/dL (ref 0.3–1.2)
Total Protein: 6.5 g/dL (ref 6.5–8.1)

## 2021-07-12 LAB — CBC
HCT: 36.4 % (ref 36.0–46.0)
Hemoglobin: 11.4 g/dL — ABNORMAL LOW (ref 12.0–15.0)
MCH: 27.7 pg (ref 26.0–34.0)
MCHC: 31.3 g/dL (ref 30.0–36.0)
MCV: 88.3 fL (ref 80.0–100.0)
Platelets: 298 10*3/uL (ref 150–400)
RBC: 4.12 MIL/uL (ref 3.87–5.11)
RDW: 15.1 % (ref 11.5–15.5)
WBC: 7.8 10*3/uL (ref 4.0–10.5)
nRBC: 0 % (ref 0.0–0.2)

## 2021-07-12 LAB — BASIC METABOLIC PANEL
Anion gap: 5 (ref 5–15)
BUN: 15 mg/dL (ref 6–20)
CO2: 25 mmol/L (ref 22–32)
Calcium: 8.6 mg/dL — ABNORMAL LOW (ref 8.9–10.3)
Chloride: 109 mmol/L (ref 98–111)
Creatinine, Ser: 0.79 mg/dL (ref 0.44–1.00)
GFR, Estimated: >60 mL/min (ref >60)
Glucose, Bld: 97 mg/dL (ref 70–99)
Potassium: 3.5 mmol/L (ref 3.5–5.1)
Sodium: 139 mmol/L (ref 135–145)

## 2021-07-12 LAB — RESP PANEL BY RT-PCR (FLU A&B, COVID) ARPGX2
Influenza A by PCR: NEGATIVE
Influenza B by PCR: NEGATIVE
SARS Coronavirus 2 by RT PCR: NEGATIVE

## 2021-07-12 LAB — I-STAT BETA HCG BLOOD, ED (MC, WL, AP ONLY): I-stat hCG, quantitative: <5 m[IU]/mL (ref <5)

## 2021-07-12 LAB — MAGNESIUM: Magnesium: 1.9 mg/dL (ref 1.7–2.4)

## 2021-07-12 LAB — BRAIN NATRIURETIC PEPTIDE: B Natriuretic Peptide: 394.3 pg/mL — ABNORMAL HIGH (ref 0.0–100.0)

## 2021-07-12 MED ORDER — ENSURE ENLIVE PO LIQD: 237.0000 mL | Freq: Two times a day (BID) | ORAL | Status: DC

## 2021-07-12 MED ORDER — ACETAMINOPHEN 650 MG RE SUPP: 650.0000 mg | Freq: Four times a day (QID) | RECTAL | Status: DC | PRN

## 2021-07-12 MED ORDER — ONDANSETRON HCL 4 MG/2ML IJ SOLN
4.0000 mg | Freq: Once | INTRAMUSCULAR | Status: AC
  Administered 2021-07-12: 4 mg via INTRAVENOUS
  Filled 2021-07-12: qty 2

## 2021-07-12 MED ORDER — FUROSEMIDE 10 MG/ML IJ SOLN
INTRAMUSCULAR | Status: AC
  Filled 2021-07-12: qty 4

## 2021-07-12 MED ORDER — FUROSEMIDE 10 MG/ML IJ SOLN
40.0000 mg | Freq: Once | INTRAMUSCULAR | Status: AC
  Administered 2021-07-12: 40 mg via INTRAVENOUS
  Filled 2021-07-12: qty 4

## 2021-07-12 MED ORDER — ENOXAPARIN SODIUM 40 MG/0.4ML IJ SOSY
40.0000 mg | PREFILLED_SYRINGE | INTRAMUSCULAR | Status: DC
  Administered 2021-07-12 – 2021-07-18 (×7): 40 mg via SUBCUTANEOUS
  Filled 2021-07-12 (×7): qty 0.4

## 2021-07-12 MED ORDER — ACETAMINOPHEN 325 MG PO TABS
650.0000 mg | ORAL_TABLET | Freq: Four times a day (QID) | ORAL | Status: DC | PRN
  Administered 2021-07-13 – 2021-07-16 (×4): 650 mg via ORAL
  Filled 2021-07-12 (×4): qty 2

## 2021-07-12 MED ORDER — ATORVASTATIN CALCIUM 10 MG PO TABS
10.0000 mg | ORAL_TABLET | Freq: Every day | ORAL | Status: DC
  Administered 2021-07-12 – 2021-07-20 (×9): 10 mg via ORAL
  Filled 2021-07-12 (×11): qty 1

## 2021-07-12 MED ORDER — PNEUMOCOCCAL VAC POLYVALENT 25 MCG/0.5ML IJ INJ: 0.5000 mL | INJECTION | INTRAMUSCULAR | Status: DC

## 2021-07-12 MED ORDER — GUAIFENESIN-DM 100-10 MG/5ML PO SYRP: 10.0000 mL | ORAL_SOLUTION | ORAL | Status: DC | PRN

## 2021-07-12 MED ORDER — SENNA 8.6 MG PO TABS
1.0000 | ORAL_TABLET | Freq: Every day | ORAL | Status: DC
  Administered 2021-07-12 – 2021-07-19 (×6): 8.6 mg via ORAL
  Filled 2021-07-12 (×6): qty 1

## 2021-07-12 MED ORDER — LISINOPRIL 5 MG PO TABS
2.5000 mg | ORAL_TABLET | Freq: Every day | ORAL | Status: DC
  Administered 2021-07-12 – 2021-07-13 (×2): 2.5 mg via ORAL
  Filled 2021-07-12 (×2): qty 1

## 2021-07-12 MED ORDER — ASPIRIN EC 81 MG PO TBEC
81.0000 mg | DELAYED_RELEASE_TABLET | Freq: Every day | ORAL | Status: DC
  Administered 2021-07-12 – 2021-07-18 (×7): 81 mg via ORAL
  Filled 2021-07-12 (×7): qty 1

## 2021-07-12 MED ORDER — ORAL CARE MOUTH RINSE
15.0000 mL | Freq: Two times a day (BID) | OROMUCOSAL | Status: DC
  Administered 2021-07-13 – 2021-07-18 (×7): 15 mL via OROMUCOSAL

## 2021-07-12 MED ORDER — ONDANSETRON HCL 4 MG/2ML IJ SOLN
4.0000 mg | Freq: Four times a day (QID) | INTRAMUSCULAR | Status: DC | PRN
  Administered 2021-07-15 – 2021-07-16 (×2): 4 mg via INTRAVENOUS
  Filled 2021-07-12 (×3): qty 2

## 2021-07-12 MED ORDER — PNEUMOCOCCAL 20-VAL CONJ VACC 0.5 ML IM SUSY
0.5000 mL | PREFILLED_SYRINGE | INTRAMUSCULAR | Status: AC
  Administered 2021-07-15: 0.5 mL via INTRAMUSCULAR
  Filled 2021-07-12: qty 0.5

## 2021-07-12 MED ORDER — ONDANSETRON HCL 4 MG PO TABS: 4.0000 mg | ORAL_TABLET | Freq: Four times a day (QID) | ORAL | Status: DC | PRN

## 2021-07-12 MED ORDER — POTASSIUM CHLORIDE CRYS ER 20 MEQ PO TBCR
40.0000 meq | EXTENDED_RELEASE_TABLET | Freq: Once | ORAL | Status: AC
  Administered 2021-07-12: 40 meq via ORAL
  Filled 2021-07-12: qty 2

## 2021-07-12 MED ORDER — AMLODIPINE BESYLATE 10 MG PO TABS
10.0000 mg | ORAL_TABLET | Freq: Every day | ORAL | Status: DC
  Filled 2021-07-12: qty 1

## 2021-07-12 NOTE — H&P (Signed)
History and Physical    [name] OMV:[id number] DOB: 29-Jan-1961 DOA: 07/12/2021  PCP: [name], PA   Patient coming from: Home.   I have personally briefly reviewed patient's old medical records in [place]  Chief Complaint: Cough, headache, body aches and shortness of breath.  HPI: Lynn Estrada is a 61 y.o. female with medical history significant of normocytic anemia, eczema, GERD, colon polyps, chronic gastritis, history of ectopic pregnancy with left salpingectomy, endometriosis, sepsis due to UTI in the setting of urolithiasis requiring a right partial nephrectomy due to a large size of urolith, history of community-acquired pneumonia, uterine fibroid, hypertension is coming to the emergency department with complaints of progressively worse dyspnea associated with cough of whitish and yellowish sputum for the past 3 to 4 weeks.  She has also had headaches and body aches.  No rhinorrhea, sore throat, wheezing or hemoptysis.  However, she has had lower extremity edema, PND and has been having orthopnea these past few days.  She denied chest pain, palpitations, diaphoresis or dizziness.  She has had some nausea and emesis triggered by cough, but denied abdominal pain, diarrhea, melena or hematochezia.  No flank pain, dysuria, frequency or hematuria.  No polyuria, polydipsia, polyphagia or blurred vision.  ED Course: Initial vital signs were temperature 99.1 F, pulse 106, respirations 16, BP 125/86 mmHg O2 sats 96%.  The patient desaturated to 84% after arriving to the emergency department.  She was placed on nasal cannula oxygen at 2 LPM.  Lab work: BNP was 394.3 pg/milliliter.  Troponin was 28 and then 25 ng/L.  CBC showed a white count 7.8, hemoglobin 11.4 g/dL and platelets 298.  BMP was normal when calcium corrected to albumin.  LFTs were normal except for indirect bilirubin of 1.0 mg/dL.  Imaging: A 2 view chest radiograph showed new moderate interstitial  pulmonary edema.  There was bibasilar likely subsegmental atelectasis.  Review of Systems: As per HPI otherwise all other systems reviewed and are negative.  Past Medical History:  Diagnosis Date   Anemia    Eczema    GERD (gastroesophageal reflux disease)    History of adenomatous polyp of colon    08-03-2016  tubular adenoma x2 and hyperplastic   History of chronic gastritis 08/03/2016   History of ectopic pregnancy 1988   s/p  left salpingectomy   History of endometriosis    History of kidney stones    History of partial nephrectomy    right pelvis for very large stone   History of pneumonia 07/02/2016   CAP   History of sepsis    07-01-2016  sepsis w/ pyelonephritis, CAP /   12-31-2016  urosepsis w/ kidney stone obstruction   History of uterine leiomyoma    Hypertension    Left ureteral stone    Nephrolithiasis    left obstructive stone and right non-obstructive stone  per CT 12-31-2016   Urgency of urination    Past Surgical History:  Procedure Laterality Date   ABDOMINAL HYSTERECTOMY  01/20/2007   w/ Lysis Adhesions/  Left salpingoophorectomy/  Right Salpingectomy   CHOLECYSTECTOMY  1994   and Right Partial Nephrectomy (pelvis for very large stone)   CYSTOSCOPY W/ URETERAL STENT PLACEMENT Left 12/31/2016   Procedure: CYSTOSCOPY WITH RETROGRADE PYELOGRAM/ LEFT URETERAL STENT PLACEMENT;  Surgeon: Alexis Frock, MD;  Location: [facility];  Service: Urology;  Laterality: Left;   CYSTOSCOPY WITH RETROGRADE PYELOGRAM, URETEROSCOPY AND STENT PLACEMENT Left 01/18/2017   Procedure: 1ST STAGE CYSTOSCOPY WITH  RETROGRADE PYELOGRAM, URETEROSCOPY AND STENT REPLACEMENT;  Surgeon: Alexis Frock, MD;  Location: [facility];  Service: Urology;  Laterality: Left;   CYSTOSCOPY WITH RETROGRADE PYELOGRAM, URETEROSCOPY AND STENT PLACEMENT Left 02/01/2017   Procedure: 2ND STAGE CYSTOSCOPY WITH RETROGRADE PYELOGRAM, URETEROSCOPY AND STENT REPLACEMENT;  Surgeon: Alexis Frock, MD;   Location: [facility];  Service: Urology;  Laterality: Left;   ECTOPIC PREGNANCY SURGERY  1988   Left Salpingectomy   HOLMIUM LASER APPLICATION Left 02/23/2352   Procedure: HOLMIUM LASER APPLICATION;  Surgeon: Alexis Frock, MD;  Location: [facility];  Service: Urology;  Laterality: Left;   HOLMIUM LASER APPLICATION Left 11/30/4429   Procedure: HOLMIUM LASER APPLICATION;  Surgeon: Alexis Frock, MD;  Location: [facility];  Service: Urology;  Laterality: Left;   LUMBAR DISC SURGERY  01/1999   right L5 -- S1   RE-EXPLORATION LUMBAR/  LAMINECTOMY AND MICRODISKECTOMY  10/14/2001   right L5 -- S1   TUBAL LIGATION Bilateral 54/00/8676   PPTL   UMBILICAL HERNIA REPAIR  age 84   Social History  reports that she quit smoking about 24 years ago. Her smoking use included cigarettes. She has a 3.75 pack-year smoking history. She has never used smokeless tobacco. She reports current alcohol use. She reports that she does not use drugs.  Allergies  Allergen Reactions   Bactrim [Sulfamethoxazole-Trimethoprim] Rash   Latex Rash   Family History  Problem Relation Age of Onset   Hypertension Sister    Eczema Sister    Lupus Sister    Sarcoidosis Mother    Prostate cancer Father    Gout Maternal Grandmother    Diabetes Maternal Grandmother        leg amputations   Breast cancer Maternal Aunt    Stomach cancer Neg Hx    Colon cancer Neg Hx    Prior to Admission medications   Medication Sig Start Date End Date Taking? Authorizing Provider  albuterol (VENTOLIN HFA) 108 (90 Base) MCG/ACT inhaler Inhale 2 puffs into the lungs every 6 (six) hours as needed for wheezing or shortness of breath. 06/17/19   Sheth, Devam P, MD  amLODipine (NORVASC) 10 MG tablet TAKE 1 TABLET (10 MG TOTAL) BY MOUTH DAILY. 09/05/17   [name], PA  docusate sodium (COLACE) 100 MG capsule Take 1 capsule (100 mg total) by mouth 2 (two) times daily as needed for mild  constipation. 06/17/19   Sheth, Devam P, MD  guaiFENesin-dextromethorphan (ROBITUSSIN DM) 100-10 MG/5ML syrup Take 10 mLs by mouth every 4 (four) hours as needed for cough. 06/17/19   Sheth, Vickii Chafe, MD  Multiple Vitamins-Minerals (MULTIVITAMIN ADULT PO) Take 1 tablet by mouth daily.    [provider]  naproxen (NAPROSYN) 500 MG tablet Take 1 tablet (500 mg total) by mouth 2 (two) times daily. 07/24/20   Suzy Bouchard, PA-C  ondansetron (ZOFRAN ODT) 4 MG disintegrating tablet 4mg  ODT q4 hours prn nausea/vomit 06/29/19   Muthersbaugh, Jarrett Soho, PA-C  senna (SENOKOT) 8.6 MG TABS tablet Take 1 tablet (8.6 mg total) by mouth at bedtime. 06/17/19   Sheth, Vickii Chafe, MD  Vitamin D, Ergocalciferol, (DRISDOL) 1.25 MG (50000 UT) CAPS capsule Take 50,000 Units by mouth once a week. 03/10/19   [provider]   Physical Exam: Vitals:   07/12/21 1145 07/12/21 1200 07/12/21 1215 07/12/21 1230  BP: 114/82 118/83 114/85 112/82  Pulse: 99 99 97 94  Resp:  20 20 20   Temp:  TempSrc:      SpO2: (!) 84% 90% 95% 94%  Weight:      Height:       Constitutional: NAD, calm, comfortable Eyes: PERRL, lids and conjunctivae normal ENMT: Mucous membranes are moist. Posterior pharynx clear of any exudate or lesions. Neck: normal, supple, no masses, no thyromegaly Respiratory: Clear to auscultation bilaterally, no wheezing, no crackles. Normal respiratory effort. No accessory muscle use.  Cardiovascular: Regular rate and rhythm, no murmurs / rubs / gallops.  2+ bilateral lower extremity pitting edema. 2+ pedal pulses. No carotid bruits.  Abdomen: Obese, no distention.  Soft, no tenderness, no masses palpated. No hepatosplenomegaly. Bowel sounds positive.  Musculoskeletal: Mild generalized weakness.  No clubbing / cyanosis. Good ROM, no contractures. Normal muscle tone.  Skin: No acute rashes, lesions, ulcers on limited dermatological examination. Neurologic: CN 2-12 grossly intact. Sensation  intact, DTR normal. Strength 5/5 in all 4.  Psychiatric: Normal judgment and insight. Alert and oriented x 3. Normal mood.   Labs on Admission: I have personally reviewed following labs and imaging studies  CBC: Recent Labs  Lab 07/12/21 1045  WBC 7.8  HGB 11.4*  HCT 36.4  MCV 88.3  PLT 893    Basic Metabolic Panel: Recent Labs  Lab 07/12/21 1045  NA 139  K 3.5  CL 109  CO2 25  GLUCOSE 97  BUN 15  CREATININE 0.79  CALCIUM 8.6*    GFR: Estimated Creatinine Clearance: 101.8 mL/min (by C-G formula based on SCr of 0.79 mg/dL).  Liver Function Tests: No results for input(s): AST, ALT, ALKPHOS, BILITOT, PROT, ALBUMIN in the last 168 hours.  Radiological Exams on Admission: DG Chest 2 View  Result Date: 07/12/2021 CLINICAL DATA:  Cough, chest pain, and shortness of breath for 1 month. Swelling in ankles. Dyspnea when lying flat. EXAM: CHEST - 2 VIEW COMPARISON:  AP chest 06/29/2019 FINDINGS: Cardiac silhouette is again at the upper limits of normal size for AP technique. Mild calcification within aortic arch with normal mediastinal contours. Increased moderate bilateral interstitial thickening. Likely small bilateral pleural effusions. Bibasilar mild heterogeneous opacities possible subsegmental atelectasis. No pneumothorax. Mild multilevel degenerative disc changes of the thoracic spine. IMPRESSION: New moderate interstitial pulmonary edema. Bibasilar likely subsegmental atelectasis. Electronically Signed   By: Yvonne Kendall   On: 07/12/2021 11:21    EKG: Independently reviewed.  Vent. rate 99 BPM PR interval 160 ms QRS duration 128 ms QT/QTcB 371/477 ms P-R-T axes 69 -39 98 Sinus rhythm Probable left atrial enlargement Left bundle branch block  Assessment/Plan Principal Problem:   Acute respiratory failure with hypoxia (HCC) In the setting of   Volume overload Observation/telemetry. Continue supplemental oxygen. Sodium and fluid restriction. Furosemide 40 mg IVP  x1. Furosemide 20 mg twice daily tomorrow AM  Active Problems:   Elevated troponin In the setting of new onset volume overload. A degree of heart failure is suspected. Check echocardiogram.    Essential hypertension Continue amlodipine 10 mg p.o. daily. Begin lisinopril 2.5 mg p.o. daily. Consider starting beta-blocker once clinically improved. May need to later decrease or discontinue amlodipine.    BMI 29.0-29.9,adult Lifestyle modifications. Follow-up with PCP.    Normocytic anemia Monitor H&H.    Hypocalcemia Recheck calcium and albumin level.   DVT prophylaxis: Lovenox SQ. Code Status:   Full code. Family Communication:   Disposition Plan:   Patient is from:  Home.  Anticipated DC to:  Home.  Anticipated DC date:  24 to 48 hours.  Anticipated DC  barriers: Clinical status/pending work-up.  Consults called:   Admission status:  Observation/telemetry.  Severity of Illness: High severity in the setting of volume overload.  Reubin Milan MD [facility]  How to contact the [facility] Attending or Consulting provider [place] or covering provider during after hours [place], for this patient?   Check the care team in [facility] and look for a) attending/consulting [facility] provider listed and b) the [facility] team listed Log into [email or web address] and use [facility]'s universal password to access. If you do not have the password, please contact the hospital operator. Locate the [facility] provider you are looking for under [facility] and page to a number that you can be directly reached. If you still have difficulty reaching the provider, please page the [facility] (Director on Call) for the Hospitalists listed on amion for assistance.  07/12/2021, 1:15 PM   This document was prepared using Dragon voice recognition software and may contain some unintended transcription errors.

## 2021-07-12 NOTE — ED Provider Notes (Signed)
Lynn Estrada   CSN: [id number] Arrival date & time: 07/12/21  1023     History  Chief Complaint  Patient presents with   Cough   Shortness of Breath    Lynn Estrada is a 61 y.o. female.  With past medical history of hypertension who presents to the emergency department with cough.  Patient states that for almost 3 weeks now she has had upper respiratory symptoms including congestion, rhinorrhea, dry cough.  She states that she has been seen multiple times, given cough medication and prednisone without relief of symptoms.  She additionally endorses nausea and vomiting, decreased p.o. intake.  She states that her symptoms have been worsening and over the past 2 days has had worsening shortness of breath and chest pressure.  She states beginning yesterday she feels like her chest is tight, she is having intermittent pain across her chest and feels like there is something pressing onto her chest.  She also endorses shortness of breath that is worse when laying down.  She states that she was not able to lay down last night to sleep and had to sit straight up.  She does have a history of hypertension however she states that it has not been significantly elevated compared to normal.  Additionally she states she has had mild ankle swelling however she states that this is not a new finding.  She denies any fevers, sinus pain or pressure, abdominal pain, diarrhea, dysuria or urinary symptoms.   Cough Associated symptoms: chest pain, headaches, rhinorrhea and shortness of breath   Associated symptoms: no fever, no sore throat and no wheezing   Shortness of Breath Associated symptoms: chest pain, cough, headaches and vomiting   Associated symptoms: no abdominal pain, no fever, no sore throat and no wheezing       Home Medications Prior to Admission medications   Medication Sig Start Date End Date Taking? Authorizing Provider  albuterol  (VENTOLIN HFA) 108 (90 Base) MCG/ACT inhaler Inhale 2 puffs into the lungs every 6 (six) hours as needed for wheezing or shortness of breath. 06/17/19   Sheth, Devam P, MD  amLODipine (NORVASC) 10 MG tablet TAKE 1 TABLET (10 MG TOTAL) BY MOUTH DAILY. 09/05/17   [name], PA  docusate sodium (COLACE) 100 MG capsule Take 1 capsule (100 mg total) by mouth 2 (two) times daily as needed for mild constipation. 06/17/19   Sheth, Devam P, MD  guaiFENesin-dextromethorphan (ROBITUSSIN DM) 100-10 MG/5ML syrup Take 10 mLs by mouth every 4 (four) hours as needed for cough. 06/17/19   Sheth, Vickii Chafe, MD  Multiple Vitamins-Minerals (MULTIVITAMIN ADULT PO) Take 1 tablet by mouth daily.    [provider]  naproxen (NAPROSYN) 500 MG tablet Take 1 tablet (500 mg total) by mouth 2 (two) times daily. 07/24/20   Suzy Bouchard, PA-C  ondansetron (ZOFRAN ODT) 4 MG disintegrating tablet 4mg  ODT q4 hours prn nausea/vomit 06/29/19   Muthersbaugh, Jarrett Soho, PA-C  senna (SENOKOT) 8.6 MG TABS tablet Take 1 tablet (8.6 mg total) by mouth at bedtime. 06/17/19   Sheth, Vickii Chafe, MD  Vitamin D, Ergocalciferol, (DRISDOL) 1.25 MG (50000 UT) CAPS capsule Take 50,000 Units by mouth once a week. 03/10/19   [provider]      Allergies    Bactrim [sulfamethoxazole-trimethoprim] and Latex    Review of Systems   Review of Systems  Constitutional:  Positive for appetite change. Negative for fever.  HENT:  Positive for congestion  and rhinorrhea. Negative for sinus pressure, sinus pain, sore throat and trouble swallowing.   Respiratory:  Positive for cough, chest tightness and shortness of breath. Negative for wheezing.   Cardiovascular:  Positive for chest pain and leg swelling. Negative for palpitations.  Gastrointestinal:  Positive for nausea and vomiting. Negative for abdominal pain and diarrhea.  Genitourinary:  Negative for dysuria.  Neurological:  Positive for headaches. Negative for dizziness, syncope and  light-headedness.  All other systems reviewed and are negative.  Physical Exam Updated Vital Signs BP 125/86 (BP Location: Right Arm)    Pulse (!) 106    Temp 99.1 F (37.3 C) (Oral)    Resp 16    Ht 6\' 1"  (1.854 m)    Wt 99.8 kg    SpO2 96%    BMI 29.03 kg/m  Physical Exam Vitals and nursing Estrada reviewed.  Constitutional:      Appearance: Normal appearance. She is well-developed. She is ill-appearing. She is not toxic-appearing.  HENT:     Head: Normocephalic and atraumatic.     Nose: Rhinorrhea present.     Mouth/Throat:     Mouth: Mucous membranes are moist.     Pharynx: Oropharynx is clear.  Eyes:     General: No scleral icterus.    Extraocular Movements: Extraocular movements intact.     Pupils: Pupils are equal, round, and reactive to light.  Neck:     Vascular: No JVD.  Cardiovascular:     Rate and Rhythm: Regular rhythm. Tachycardia present.     Pulses: Normal pulses.     Heart sounds: No murmur heard. Pulmonary:     Effort: Tachypnea present. No respiratory distress.     Breath sounds: Normal breath sounds.  Chest:     Chest wall: No tenderness.  Abdominal:     General: Bowel sounds are normal.     Palpations: Abdomen is soft.     Tenderness: There is no abdominal tenderness.  Musculoskeletal:        General: Normal range of motion.     Cervical back: Normal range of motion and neck supple.     Right lower leg: No tenderness. Edema present.     Left lower leg: No tenderness. Edema present.     Comments: Trace lower extremity edema  Skin:    General: Skin is warm and dry.     Capillary Refill: Capillary refill takes less than 2 seconds.  Neurological:     General: No focal deficit present.     Mental Status: She is alert and oriented to person, place, and time. Mental status is at baseline.  Psychiatric:        Mood and Affect: Mood normal.        Behavior: Behavior normal.    ED Results / Procedures / Treatments   Labs (all labs ordered are listed,  but only abnormal results are displayed) Labs Reviewed  BASIC METABOLIC PANEL - Abnormal; Notable for the following components:      Result Value   Calcium 8.6 (*)    All other components within normal limits  CBC - Abnormal; Notable for the following components:   Hemoglobin 11.4 (*)    All other components within normal limits  BRAIN NATRIURETIC PEPTIDE - Abnormal; Notable for the following components:   B Natriuretic Peptide 394.3 (*)    All other components within normal limits  TROPONIN I (HIGH SENSITIVITY) - Abnormal; Notable for the following components:   Troponin I (High Sensitivity)  28 (*)    All other components within normal limits  TROPONIN I (HIGH SENSITIVITY) - Abnormal; Notable for the following components:   Troponin I (High Sensitivity) 25 (*)    All other components within normal limits  RESP PANEL BY RT-PCR (FLU A&B, COVID) [id number]  HEPATIC FUNCTION PANEL  MAGNESIUM  I-STAT BETA HCG BLOOD, ED ([facility], [facility], [facility] ONLY)  I-STAT BETA HCG BLOOD, ED ([facility], [facility], [facility] ONLY)    EKG EKG Interpretation  Date/Time:  Wednesday July 12 2021 10:43:17 EST Ventricular Rate:  99 PR Interval:  160 QRS Duration: 128 QT Interval:  371 QTC Calculation: 477 R Axis:   -39 Text Interpretation: Sinus rhythm Probable left atrial enlargement Left bundle branch block Confirmed by Dene Gentry [phone]) on 07/12/2021 10:45:28 AM  Radiology DG Chest 2 View  Result Date: 07/12/2021 CLINICAL DATA:  Cough, chest pain, and shortness of breath for 1 month. Swelling in ankles. Dyspnea when lying flat. EXAM: CHEST - 2 VIEW COMPARISON:  AP chest 06/29/2019 FINDINGS: Cardiac silhouette is again at the upper limits of normal size for AP technique. Mild calcification within aortic arch with normal mediastinal contours. Increased moderate bilateral interstitial thickening. Likely small bilateral pleural effusions. Bibasilar mild heterogeneous opacities possible subsegmental atelectasis. No pneumothorax. Mild  multilevel degenerative disc changes of the thoracic spine. IMPRESSION: New moderate interstitial pulmonary edema. Bibasilar likely subsegmental atelectasis. Electronically Signed   By: Yvonne Kendall   On: 07/12/2021 11:21    Procedures Procedures    Medications Ordered in ED Medications  enoxaparin (LOVENOX) injection 40 mg (has no administration in time range)  acetaminophen (TYLENOL) tablet 650 mg (has no administration in time range)    Or  acetaminophen (TYLENOL) suppository 650 mg (has no administration in time range)  ondansetron (ZOFRAN) tablet 4 mg (has no administration in time range)    Or  ondansetron (ZOFRAN) injection 4 mg (has no administration in time range)  lisinopril (ZESTRIL) tablet 2.5 mg (has no administration in time range)  ondansetron (ZOFRAN) injection 4 mg (4 mg Intravenous Given 07/12/21 1124)   ED Course/ Medical Decision Making/ A&P                           Medical Decision Making This patient presents to the ED for concern of shortness of breath, this involves an extensive number of treatment options, and is a complaint that carries with it a high risk of complications and morbidity.  The differential diagnosis includes pneumonia, pneumothorax, ACS, COPD, CHF, asthma, cardiac tamponade, pericarditis, myocarditis, dissection   Co morbidities that complicate the patient evaluation  Hypertension   Additional history obtained:  Additional history obtained from outside record External records from outside source obtained and reviewed including previous hospital admission, ED visits   Lab Tests:  I Ordered, and personally interpreted labs.   The pertinent results include:   CBC within normal limits BMP within normal limits COVID and flu negative Not pregnant BNP 394.3 First troponin 28, repeat troponin 25, delta -3  Imaging Studies ordered:  I ordered imaging studies including EKG, chest x-ray I independently visualized and interpreted  imaging which showed EKG: Left bundle branch block with sinus rhythm Chest x-ray: Bibasilar atelectasis, pulmonary edema I agree with the radiologist interpretation   Cardiac Monitoring:  The patient was maintained on a cardiac monitor.  I personally viewed and interpreted the cardiac monitored which showed an underlying rhythm of: Sinus rhythm   Medicines ordered and  prescription drug management:  I ordered medication including Zofran for nausea Reevaluation of the patient after these medicines showed that the patient improved I have reviewed the patients home medicines and have made adjustments as needed   Test Considered:  CTA PE study   Critical Interventions:  Placed patient on oxygen   Consultations Obtained:  I requested consultation with the hospitalist,  and discussed lab and imaging findings as well as pertinent plan - they recommend: Admission   Problem List / ED Course:  Shortness of breath 61 year old female who presents to the emergency department for 3 weeks of worsening upper respiratory symptoms.  After discussion with patient it sounds like she has had worsening orthopnea at home as well as lower extremity edema.  Labs obtained which showed initial troponin of 28 and repeat of 25, EKG without signs of ischemia or infarction.  Doubt ACS.  As her upper respiratory symptoms, COVID and flu negative.  Her presentation is not consistent with myocarditis, pericarditis, cardiac tamponade.  Presentation is not consistent with dissection.  Considered but doubt PE as a component of her symptoms.  Her presentation is not consistent with PE.  Labs obtained which shows BNP of 394.  There is no previous to compare, patient does not have documented history of congestive heart failure.  Believe this to be the source of her symptoms at this time.  While she was here the patient desatted to 84% she also desatted to 90%.  Patient was placed on 2 L nasal cannula for  hypoxia.   Reevaluation:  After the interventions noted above, I reevaluated the patient and found that they have :improved  Dispostion:  After consideration of the diagnostic results and the patients response to treatment, I feel that the patent would benefit from admission.  Patient with new diagnosis of congestive heart failure requiring oxygen at this time.  She will also likely require Lasix in order to decrease fluid volume overloaded.  Given that she has had oxygen requirement with continued shortness of breath will need admission for consult to cardiology and likely full cardiac work-up while she is here in the hospital.  Discussed these findings with the patient who agrees with plan for admission.  Discussed patient with Dr. Olevia Bowens, hospitalist, who agrees with admission at this time.   Final Clinical Impression(s) / ED Diagnoses Final diagnoses:  Shortness of breath    Rx / DC Orders ED Discharge Orders     None         Mickie Hillier, PA-C 07/12/21 1335    Valarie Merino, MD 07/13/21 1456

## 2021-07-12 NOTE — ED Triage Notes (Signed)
Pt c/o ongoing cough, headaches, body aches for approximately one month. Pt states she is also having increasing dyspnea when laying flat and has noticed swelling in her ankles. Pt endorses CP and SOB during triage.

## 2021-07-12 NOTE — ED Notes (Signed)
Pt was resting with eyes closed and o2 was in the low 80s, upon waking sats went up to 87% on Room air. Pt denies having sleep apnea. 2l/[place] placed on pt at this time.

## 2021-07-12 NOTE — ED Notes (Signed)
Pt resting with eyes closed and lights off. Denies any needs at this time.

## 2021-07-13 ENCOUNTER — Observation Stay (HOSPITAL_COMMUNITY): Payer: BC Managed Care – PPO

## 2021-07-13 DIAGNOSIS — I509 Heart failure, unspecified: Secondary | ICD-10-CM | POA: Diagnosis not present

## 2021-07-13 DIAGNOSIS — E8779 Other fluid overload: Secondary | ICD-10-CM | POA: Diagnosis not present

## 2021-07-13 DIAGNOSIS — I2510 Atherosclerotic heart disease of native coronary artery without angina pectoris: Secondary | ICD-10-CM | POA: Diagnosis present

## 2021-07-13 DIAGNOSIS — E663 Overweight: Secondary | ICD-10-CM | POA: Diagnosis not present

## 2021-07-13 DIAGNOSIS — Z6829 Body mass index (BMI) 29.0-29.9, adult: Secondary | ICD-10-CM | POA: Diagnosis not present

## 2021-07-13 DIAGNOSIS — I5030 Unspecified diastolic (congestive) heart failure: Secondary | ICD-10-CM

## 2021-07-13 DIAGNOSIS — Z79899 Other long term (current) drug therapy: Secondary | ICD-10-CM | POA: Diagnosis not present

## 2021-07-13 DIAGNOSIS — I110 Hypertensive heart disease with heart failure: Secondary | ICD-10-CM | POA: Diagnosis present

## 2021-07-13 DIAGNOSIS — J9601 Acute respiratory failure with hypoxia: Secondary | ICD-10-CM | POA: Diagnosis present

## 2021-07-13 DIAGNOSIS — Z87891 Personal history of nicotine dependence: Secondary | ICD-10-CM | POA: Diagnosis not present

## 2021-07-13 DIAGNOSIS — Z8719 Personal history of other diseases of the digestive system: Secondary | ICD-10-CM | POA: Diagnosis not present

## 2021-07-13 DIAGNOSIS — I5021 Acute systolic (congestive) heart failure: Secondary | ICD-10-CM | POA: Diagnosis not present

## 2021-07-13 DIAGNOSIS — R7303 Prediabetes: Secondary | ICD-10-CM | POA: Diagnosis present

## 2021-07-13 DIAGNOSIS — I5043 Acute on chronic combined systolic (congestive) and diastolic (congestive) heart failure: Secondary | ICD-10-CM | POA: Diagnosis present

## 2021-07-13 DIAGNOSIS — I10 Essential (primary) hypertension: Secondary | ICD-10-CM | POA: Diagnosis not present

## 2021-07-13 DIAGNOSIS — I447 Left bundle-branch block, unspecified: Secondary | ICD-10-CM | POA: Diagnosis present

## 2021-07-13 DIAGNOSIS — D649 Anemia, unspecified: Secondary | ICD-10-CM

## 2021-07-13 DIAGNOSIS — Z87442 Personal history of urinary calculi: Secondary | ICD-10-CM | POA: Diagnosis not present

## 2021-07-13 DIAGNOSIS — I5042 Chronic combined systolic (congestive) and diastolic (congestive) heart failure: Secondary | ICD-10-CM | POA: Diagnosis not present

## 2021-07-13 DIAGNOSIS — Z9049 Acquired absence of other specified parts of digestive tract: Secondary | ICD-10-CM | POA: Diagnosis not present

## 2021-07-13 DIAGNOSIS — I5041 Acute combined systolic (congestive) and diastolic (congestive) heart failure: Secondary | ICD-10-CM | POA: Diagnosis not present

## 2021-07-13 DIAGNOSIS — Z23 Encounter for immunization: Secondary | ICD-10-CM | POA: Diagnosis not present

## 2021-07-13 DIAGNOSIS — Z882 Allergy status to sulfonamides status: Secondary | ICD-10-CM | POA: Diagnosis not present

## 2021-07-13 DIAGNOSIS — R778 Other specified abnormalities of plasma proteins: Secondary | ICD-10-CM | POA: Diagnosis not present

## 2021-07-13 DIAGNOSIS — Z905 Acquired absence of kidney: Secondary | ICD-10-CM | POA: Diagnosis not present

## 2021-07-13 DIAGNOSIS — Z86010 Personal history of colonic polyps: Secondary | ICD-10-CM | POA: Diagnosis not present

## 2021-07-13 DIAGNOSIS — Z91040 Latex allergy status: Secondary | ICD-10-CM | POA: Diagnosis not present

## 2021-07-13 DIAGNOSIS — Z20822 Contact with and (suspected) exposure to covid-19: Secondary | ICD-10-CM | POA: Diagnosis present

## 2021-07-13 DIAGNOSIS — Z9851 Tubal ligation status: Secondary | ICD-10-CM | POA: Diagnosis not present

## 2021-07-13 DIAGNOSIS — R0602 Shortness of breath: Secondary | ICD-10-CM | POA: Diagnosis present

## 2021-07-13 DIAGNOSIS — K219 Gastro-esophageal reflux disease without esophagitis: Secondary | ICD-10-CM | POA: Diagnosis present

## 2021-07-13 DIAGNOSIS — E669 Obesity, unspecified: Secondary | ICD-10-CM | POA: Diagnosis present

## 2021-07-13 DIAGNOSIS — Z8249 Family history of ischemic heart disease and other diseases of the circulatory system: Secondary | ICD-10-CM | POA: Diagnosis not present

## 2021-07-13 LAB — ECHOCARDIOGRAM COMPLETE
AR max vel: 3.28 cm2
AV Area VTI: 3.02 cm2
AV Area mean vel: 2.6 cm2
AV Mean grad: 7 mmHg
AV Peak grad: 11.4 mmHg
Ao pk vel: 1.69 m/s
Calc EF: 34.5 %
Height: 73 in
P 1/2 time: 408 msec
S' Lateral: 5.8 cm
Single Plane A2C EF: 29.4 %
Single Plane A4C EF: 33.6 %
Weight: 3326.3 oz

## 2021-07-13 LAB — BASIC METABOLIC PANEL
Anion gap: 9 (ref 5–15)
BUN: 17 mg/dL (ref 6–20)
CO2: 25 mmol/L (ref 22–32)
Calcium: 8.4 mg/dL — ABNORMAL LOW (ref 8.9–10.3)
Chloride: 106 mmol/L (ref 98–111)
Creatinine, Ser: 0.85 mg/dL (ref 0.44–1.00)
GFR, Estimated: >60 mL/min (ref >60)
Glucose, Bld: 105 mg/dL — ABNORMAL HIGH (ref 70–99)
Potassium: 3.8 mmol/L (ref 3.5–5.1)
Sodium: 140 mmol/L (ref 135–145)

## 2021-07-13 LAB — PROCALCITONIN: Procalcitonin: <0.1 ng/mL

## 2021-07-13 LAB — HIV ANTIBODY (ROUTINE TESTING W REFLEX): HIV Screen 4th Generation wRfx: NONREACTIVE

## 2021-07-13 MED ORDER — FUROSEMIDE 10 MG/ML IJ SOLN
40.0000 mg | Freq: Every day | INTRAMUSCULAR | Status: DC
  Administered 2021-07-13: 40 mg via INTRAVENOUS
  Filled 2021-07-13: qty 4

## 2021-07-13 MED ORDER — ADULT MULTIVITAMIN W/MINERALS CH
1.0000 | ORAL_TABLET | Freq: Every day | ORAL | Status: DC
  Administered 2021-07-13 – 2021-07-20 (×8): 1 via ORAL
  Filled 2021-07-13 (×8): qty 1

## 2021-07-13 NOTE — Progress Notes (Signed)
PROGRESS NOTE  Lynn Estrada:[id number] DOB: [birth year]   PCP: [name], PA  Patient is from: Home.  Independently ambulates at baseline.  DOA: 07/12/2021 LOS: 0  Chief complaints:  Chief Complaint  Patient presents with   Cough   Shortness of Breath     Brief Narrative / Interim history: 61 year old F with PMH of HTN, anemia, GERD, gastritis, gastric polyps and UTI related to large nephrolithiasis requiring right partial nephrectomy presenting with progressive dyspnea, DOE, productive cough with yellowish phlegm, headache, myalgia, orthopnea, PND and LE edema, and admitted for acute hypoxic RF due to acute CHF.  Desaturated to 84% in ED requiring 2 L to maintain saturation in 90s.  BNP 394.  Troponin 28>> 25.  EKG sinus rhythm with LBBB (chronic).  CXR with new moderate interstitial pulmonary edema.  Started on IV Lasix.  TTE ordered.  Subjective: Seen and examined earlier this morning.  Still with shortness of breath, cough with yellowish to pink phlegm, orthopnea and PND.  She denies chest pain.  She denies GI or UTI symptoms.  Objective: Vitals:   07/12/21 1640 07/12/21 2112 07/13/21 0111 07/13/21 0500  BP: 116/77 107/70 119/77   Pulse: 96 (!) 101 94   Resp: (!) 25 16 18    Temp: 98.4 F (36.9 C) 98.2 F (36.8 C) 100 F (37.8 C)   TempSrc: Oral Oral Oral   SpO2: 93% 94% 94%   Weight: 94.3 kg   94.3 kg  Height: 6\' 1"  (1.854 m)       Examination:  GENERAL: No apparent distress.  Nontoxic. HEENT: MMM.  Vision and hearing grossly intact.  NECK: Supple.  No apparent JVD.  RESP: 94% on 2 L.  No IWOB.  Fair aeration bilaterally. CVS:  RRR. Heart sounds normal.  ABD/GI/GU: BS+. Abd soft, NTND.  MSK/EXT:  Moves extremities. No apparent deformity.  Trace BLE edema. SKIN: no apparent skin lesion or wound NEURO: Awake, alert and oriented appropriately.  No apparent focal neuro deficit. PSYCH: Calm. Normal affect.   Procedures:  None  Microbiology  summarized: [id number] and influenza PCR nonreactive.  Assessment & Plan:  Acute respiratory failure with hypoxia due to acute CHF-unknown type.  Presents with cardinal symptoms.  BNP elevated to 394.  CXR consistent with CHF.  She has history of HTN and LBBB.  Reports taking ibuprofen 2-3 times a day.  Denies family history of heart disease.  Started on IV Lasix.  About 1 L UOP overnight. -Continue IV Lasix 40 mg daily -Follow TTE -Monitor fluid status, renal functions and electrolytes -Wean oxygen as able.  Ambulatory saturation -Sodium and fluid restriction -Advised against NSAID -Recommend outpatient sleep study to rule out sleep apnea -Cardiology consult depending on TTE results.  Elevated troponin/chronic LBBB-likely demand ischemia from the above. -Follow TTE -IV Lasix as above   Essential hypertension: Normotensive. -Hold home amlodipine -Continue low-dose lisinopril-started this admission. -IV Lasix as above   Normocytic anemia: H&H at baseline.  Denies melena or hematochezia. Recent Labs    07/12/21 1045  HGB 11.4*  -Check anemia panel  Mild hypocalcemia -Monitor  Overweight Body mass index is 27.43 kg/m.         DVT prophylaxis:  enoxaparin (LOVENOX) injection 40 mg Start: 07/12/21 1800  Code Status: Full code Family Communication: Patient and/or RN. Available if any question.  Level of care: Telemetry Status is: Inpatient  Remains inpatient appropriate because: Acute hypoxic respiratory failure due to acute CHF requiring diuretics and supplemental oxygen  Consultants:  None   Sch Meds:  Scheduled Meds:  aspirin EC  81 mg Oral Daily   atorvastatin  10 mg Oral Daily   enoxaparin (LOVENOX) injection  40 mg Subcutaneous Q24H   feeding supplement  237 mL Oral BID BM   furosemide  40 mg Intravenous Daily   lisinopril  2.5 mg Oral Daily   mouth rinse  15 mL Mouth Rinse BID   pneumococcal 20-valent conjugate vaccine  0.5 mL Intramuscular  Tomorrow-1000   senna  1 tablet Oral QHS   Continuous Infusions: PRN Meds:.acetaminophen **OR** acetaminophen, guaiFENesin-dextromethorphan, ondansetron **OR** ondansetron (ZOFRAN) IV  Antimicrobials: Anti-infectives (From admission, onward)    None        I have personally reviewed the following labs and images: CBC: Recent Labs  Lab 07/12/21 1045  WBC 7.8  HGB 11.4*  HCT 36.4  MCV 88.3  PLT 298   BMP &GFR Recent Labs  Lab 07/12/21 1045 07/12/21 1216 07/13/21 0329  NA 139  --  140  K 3.5  --  3.8  CL 109  --  106  CO2 25  --  25  GLUCOSE 97  --  105*  BUN 15  --  17  CREATININE 0.79  --  0.85  CALCIUM 8.6*  --  8.4*  MG  --  1.9  --    Estimated Creatinine Clearance: 93.4 mL/min (by C-G formula based on SCr of 0.85 mg/dL). Liver & Pancreas: Recent Labs  Lab 07/12/21 1216  AST 24  ALT 39  ALKPHOS 49  BILITOT 1.2  PROT 6.5  ALBUMIN 3.5   No results for input(s): LIPASE, AMYLASE in the last 168 hours. No results for input(s): AMMONIA in the last 168 hours. Diabetic: No results for input(s): HGBA1C in the last 72 hours. No results for input(s): GLUCAP in the last 168 hours. Cardiac Enzymes: No results for input(s): CKTOTAL, CKMB, CKMBINDEX, TROPONINI in the last 168 hours. No results for input(s): PROBNP in the last 8760 hours. Coagulation Profile: No results for input(s): INR, PROTIME in the last 168 hours. Thyroid Function Tests: No results for input(s): TSH, T4TOTAL, FREET4, T3FREE, THYROIDAB in the last 72 hours. Lipid Profile: No results for input(s): CHOL, HDL, LDLCALC, TRIG, CHOLHDL, LDLDIRECT in the last 72 hours. Anemia Panel: No results for input(s): VITAMINB12, FOLATE, FERRITIN, TIBC, IRON, RETICCTPCT in the last 72 hours. Urine analysis:    Component Value Date/Time   COLORURINE YELLOW 06/11/2019 1314   APPEARANCEUR HAZY (A) 06/11/2019 1314   LABSPEC 1.016 06/11/2019 1314   PHURINE 5.0 06/11/2019 1314   GLUCOSEU NEGATIVE 06/11/2019  1314   HGBUR NEGATIVE 06/11/2019 1314   BILIRUBINUR NEGATIVE 06/11/2019 1314   BILIRUBINUR negative 12/07/2015 1226   KETONESUR 20 (A) 06/11/2019 1314   PROTEINUR 30 (A) 06/11/2019 1314   UROBILINOGEN 0.2 12/07/2015 1226   NITRITE NEGATIVE 06/11/2019 1314   LEUKOCYTESUR TRACE (A) 06/11/2019 1314   Sepsis Labs: Invalid input(s): PROCALCITONIN, [place]  Microbiology: Recent Results (from the past 240 hour(s))  Resp Panel by RT-PCR (Flu A&B, Covid) Nasopharyngeal Swab     Status: None   Collection Time: 07/12/21 10:48 AM   Specimen: Nasopharyngeal Swab; Nasopharyngeal(NP) swabs in vial transport medium  Result Value Ref Range Status   SARS Coronavirus 2 by RT PCR NEGATIVE NEGATIVE Final    Comment: (NOTE) SARS-CoV-2 target nucleic acids are NOT DETECTED.  The SARS-CoV-2 RNA is generally detectable in upper respiratory specimens during the acute phase of infection. The lowest concentration  of SARS-CoV-2 viral copies this assay can detect is 138 copies/mL. A negative result does not preclude SARS-Cov-2 infection and should not be used as the sole basis for treatment or other patient management decisions. A negative result may occur with  improper specimen collection/handling, submission of specimen other than nasopharyngeal swab, presence of viral mutation(s) within the areas targeted by this assay, and inadequate number of viral copies(<138 copies/mL). A negative result must be combined with clinical observations, patient history, and epidemiological information. The expected result is Negative.  Fact Sheet for Patients:  EntrepreneurPulse.com.au  Fact Sheet for Healthcare Providers:  IncredibleEmployment.be  This test is no t yet approved or cleared by the [place] FDA and  has been authorized for detection and/or diagnosis of SARS-CoV-2 by FDA under an Emergency Use Authorization (EUA). This EUA will remain  in effect (meaning  this test can be used) for the duration of the COVID-19 declaration under Section 564(b)(1) of the Act, 21 U.S.C.section 360bbb-3(b)(1), unless the authorization is terminated  or revoked sooner.       Influenza A by PCR NEGATIVE NEGATIVE Final   Influenza B by PCR NEGATIVE NEGATIVE Final    Comment: (NOTE) The Xpert Xpress SARS-CoV-2/FLU/RSV plus assay is intended as an aid in the diagnosis of influenza from Nasopharyngeal swab specimens and should not be used as a sole basis for treatment. Nasal washings and aspirates are unacceptable for Xpert Xpress SARS-CoV-2/FLU/RSV testing.  Fact Sheet for Patients: EntrepreneurPulse.com.au  Fact Sheet for Healthcare Providers: IncredibleEmployment.be  This test is not yet approved or cleared by the [place] FDA and has been authorized for detection and/or diagnosis of SARS-CoV-2 by FDA under an Emergency Use Authorization (EUA). This EUA will remain in effect (meaning this test can be used) for the duration of the COVID-19 declaration under Section 564(b)(1) of the Act, 21 U.S.C. section 360bbb-3(b)(1), unless the authorization is terminated or revoked.  Performed at [facility], [place] [address] [place], [place] [address]     Radiology Studies: No results found.    Philisha Weinel T. [place]  If 7PM-7AM, please contact night-coverage [email or web address] 07/13/2021, 2:14 PM

## 2021-07-13 NOTE — TOC Initial Note (Addendum)
Transition of Care [facility]) - Initial/Assessment Note    Patient Details  Name: Lynn Estrada MRN: [id number] Date of Birth: Jun 14, 1961  Transition of Care [facility]) CM/SW Contact:    Leeroy Cha, RN Phone Number: 07/13/2021, 8:20 AM  Clinical Narrative:                 Home heart failure screening sent to [facility]/amy hiatt for review. [facility] unable to do, information and request sent to [facility]. Expected Discharge Plan: [place] Barriers to Discharge: Continued Medical Work up   Patient Goals and CMS Choice Patient states their goals for this hospitalization and ongoing recovery are:: to go home CMS Medicare.gov Compare Post Acute Care list provided to:: Patient    Expected Discharge Plan and Services Expected Discharge Plan: [place]   Discharge Planning Services: CM Consult Post Acute Care Choice: [place] arrangements for the past 2 months: [place] Arranged: Disease Management [place] Agency: [place] Date [place]: 07/13/21 Time HH Agency Contacted: [id number] Representative spoke with at [place]: amy hiatt  Prior Living Arrangements/Services Living arrangements for the past 2 months: [place] with:: Spouse Patient language and need for interpreter reviewed:: Yes Do you feel safe going back to the place where you live?: Yes            Criminal Activity/Legal Involvement Pertinent to Current Situation/Hospitalization: No - Comment as needed  Activities of Daily Living Home Assistive Devices/Equipment: Walker (specify type), Eyeglasses ADL Screening (condition at time of admission) Patient's cognitive ability adequate to safely complete daily activities?: Yes Is the patient deaf or have difficulty hearing?: No Does the patient have difficulty seeing, even when wearing glasses/contacts?: No Does the patient have difficulty  concentrating, remembering, or making decisions?: No Patient able to express need for assistance with ADLs?: Yes Does the patient have difficulty dressing or bathing?: No Independently performs ADLs?: Yes (appropriate for developmental age) Does the patient have difficulty walking or climbing stairs?: Yes Weakness of Legs: Both Weakness of Arms/Hands: None  Permission Sought/Granted                  Emotional Assessment Appearance:: Appears stated age Attitude/Demeanor/Rapport: Engaged Affect (typically observed): Calm Orientation: : Oriented to Self, Oriented to Place, Oriented to  Time, Oriented to Situation Alcohol / Substance Use: Not Applicable Psych Involvement: No (comment)  Admission diagnosis:  Shortness of breath [R06.02] Volume overload [E87.70] Patient Active Problem List   Diagnosis Date Noted   Volume overload 07/12/2021   Hypocalcemia 07/12/2021   Elevated troponin 07/12/2021   Suspected COVID-19 virus infection 06/11/2019   Acute respiratory failure with hypoxia ([place]) 06/11/2019   Multifocal pneumonia 06/11/2019   Ureteral stone with hydronephrosis 12/31/2016   Normocytic anemia 07/10/2016   Essential hypertension 07/26/2015   Eczema 07/26/2015   BMI 29.0-29.9,adult 07/26/2015   PCP:  [name], PA Pharmacy:   [id number] [place], [place] [address] [place] [address] Phone: [phone] Fax: [phone]     Social Determinants of Health (SDOH) Interventions    Readmission Risk Interventions No flowsheet data found.

## 2021-07-13 NOTE — Progress Notes (Signed)
Initial Nutrition Assessment  DOCUMENTATION CODES:  Not applicable  INTERVENTION:  Discontinue Ensure BID.  Add Magic cup TID with meals, each supplement provides 290 kcal and 9 grams of protein.  Add MVI with minerals daily.  Encourage PO and supplement intake.  NUTRITION DIAGNOSIS:  Increased nutrient needs related to acute illness as evidenced by estimated needs.  GOAL:  Patient will meet greater than or equal to 90% of their needs  MONITOR:  PO intake, Supplement acceptance, Labs, Weight trends, I & O's  REASON FOR ASSESSMENT:  Malnutrition Screening Tool    ASSESSMENT:  61 yo female with a PMH of normocytic anemia, eczema, GERD, colon polyps, chronic gastritis, history of ectopic pregnancy with left salpingectomy, endometriosis, sepsis due to UTI in the setting of urolithiasis requiring a right partial nephrectomy due to a large size of urolith, history of community-acquired pneumonia, uterine fibroid, hypertension is coming to the emergency department with complaints of progressively worse dyspnea associated with cough of whitish and yellowish sputum for the past 3 to 4 weeks. Admitted with volume overload.  RD working remotely. Attempted to call patient's room phone. Pt did not answer.  Of note, pt with mild BLE edema.  Unable to determine if weight loss has been caused by fluid shifts or true weight loss in patient.  Patient may be malnourished to some degree, but cannot definitely diagnose at this time.  Pt refusing Ensure BID. RD to change supplement to Magic Cup TID and discontinue Ensure BID. Also will add MVI with minerals daily.  Supplements: Ensure BID  Medications: reviewed; Lasix  Labs: reviewed; Glucose 105 (H)  NUTRITION - FOCUSED PHYSICAL EXAM: Unable to perform - defer to follow-up  Diet Order:   Diet Order             Diet Heart Room service appropriate? Yes; Fluid consistency: Thin; Fluid restriction: 1200 mL Fluid  Diet effective now                   EDUCATION NEEDS:  Education needs have been addressed  Skin:  Skin Assessment: Reviewed RN Assessment  Last BM:  07/11/21  Height:  Ht Readings from Last 1 Encounters:  07/12/21 6\' 1"  (1.854 m)   Weight:  Wt Readings from Last 1 Encounters:  07/13/21 94.3 kg   BMI:  Body mass index is 27.43 kg/m.  Estimated Nutritional Needs:  Kcal:  2050-2250 Protein:  110-125 grams Fluid:  1200 ml per MD  Derrel Nip, RD, LDN (she/her/hers) Clinical Inpatient Dietitian RD Pager/After-Hours/Weekend Pager # in [place]

## 2021-07-13 NOTE — Progress Notes (Signed)
° °  Echocardiogram 2D Echocardiogram has been performed.  Lynn Estrada 07/13/2021, 10:24 AM

## 2021-07-14 DIAGNOSIS — E876 Hypokalemia: Secondary | ICD-10-CM

## 2021-07-14 DIAGNOSIS — I5043 Acute on chronic combined systolic (congestive) and diastolic (congestive) heart failure: Secondary | ICD-10-CM

## 2021-07-14 LAB — RENAL FUNCTION PANEL
Albumin: 3.5 g/dL (ref 3.5–5.0)
Anion gap: 10 (ref 5–15)
BUN: 21 mg/dL — ABNORMAL HIGH (ref 6–20)
CO2: 24 mmol/L (ref 22–32)
Calcium: 8.5 mg/dL — ABNORMAL LOW (ref 8.9–10.3)
Chloride: 103 mmol/L (ref 98–111)
Creatinine, Ser: 0.77 mg/dL (ref 0.44–1.00)
GFR, Estimated: >60 mL/min (ref >60)
Glucose, Bld: 106 mg/dL — ABNORMAL HIGH (ref 70–99)
Phosphorus: 3.8 mg/dL (ref 2.5–4.6)
Potassium: 3.3 mmol/L — ABNORMAL LOW (ref 3.5–5.1)
Sodium: 137 mmol/L (ref 135–145)

## 2021-07-14 LAB — RESPIRATORY PANEL BY PCR

## 2021-07-14 LAB — LIPID PANEL
Cholesterol: 121 mg/dL (ref 0–200)
HDL: 32 mg/dL — ABNORMAL LOW (ref >40)
LDL Cholesterol: 70 mg/dL (ref 0–99)
Total CHOL/HDL Ratio: 3.8 RATIO
Triglycerides: 95 mg/dL (ref <150)
VLDL: 19 mg/dL (ref 0–40)

## 2021-07-14 LAB — BRAIN NATRIURETIC PEPTIDE: B Natriuretic Peptide: 247.1 pg/mL — ABNORMAL HIGH (ref 0.0–100.0)

## 2021-07-14 LAB — IRON AND TIBC
Iron: 38 ug/dL (ref 28–170)
Saturation Ratios: 14 % (ref 10.4–31.8)
TIBC: 266 ug/dL (ref 250–450)
UIBC: 228 ug/dL

## 2021-07-14 LAB — CBC
HCT: 35.4 % — ABNORMAL LOW (ref 36.0–46.0)
Hemoglobin: 11 g/dL — ABNORMAL LOW (ref 12.0–15.0)
MCH: 27.5 pg (ref 26.0–34.0)
MCHC: 31.1 g/dL (ref 30.0–36.0)
MCV: 88.5 fL (ref 80.0–100.0)
Platelets: 280 10*3/uL (ref 150–400)
RBC: 4 MIL/uL (ref 3.87–5.11)
RDW: 14.9 % (ref 11.5–15.5)
WBC: 6.3 10*3/uL (ref 4.0–10.5)
nRBC: 0 % (ref 0.0–0.2)

## 2021-07-14 LAB — RETICULOCYTES
Immature Retic Fract: 24.4 % — ABNORMAL HIGH (ref 2.3–15.9)
RBC.: 4.01 MIL/uL (ref 3.87–5.11)
Retic Count, Absolute: 69 10*3/uL (ref 19.0–186.0)
Retic Ct Pct: 1.7 % (ref 0.4–3.1)

## 2021-07-14 LAB — HEMOGLOBIN A1C
Hgb A1c MFr Bld: 6.2 % — ABNORMAL HIGH (ref 4.8–5.6)
Mean Plasma Glucose: 131.24 mg/dL

## 2021-07-14 LAB — MAGNESIUM: Magnesium: 2.1 mg/dL (ref 1.7–2.4)

## 2021-07-14 LAB — TSH: TSH: 0.642 u[IU]/mL (ref 0.350–4.500)

## 2021-07-14 LAB — VITAMIN B12: Vitamin B-12: 427 pg/mL (ref 180–914)

## 2021-07-14 LAB — FERRITIN: Ferritin: 164 ng/mL (ref 11–307)

## 2021-07-14 LAB — PROCALCITONIN: Procalcitonin: <0.1 ng/mL

## 2021-07-14 LAB — FOLATE: Folate: 41.8 ng/mL (ref >5.9)

## 2021-07-14 MED ORDER — SACUBITRIL-VALSARTAN 24-26 MG PO TABS
1.0000 | ORAL_TABLET | Freq: Two times a day (BID) | ORAL | Status: DC
  Administered 2021-07-15: 1 via ORAL
  Filled 2021-07-14 (×7): qty 1

## 2021-07-14 MED ORDER — POTASSIUM CHLORIDE CRYS ER 20 MEQ PO TBCR
40.0000 meq | EXTENDED_RELEASE_TABLET | ORAL | Status: AC
  Administered 2021-07-14 (×2): 40 meq via ORAL
  Filled 2021-07-14 (×2): qty 2

## 2021-07-14 MED ORDER — METOPROLOL TARTRATE 25 MG PO TABS
12.5000 mg | ORAL_TABLET | Freq: Two times a day (BID) | ORAL | Status: DC
  Filled 2021-07-14: qty 1

## 2021-07-14 MED ORDER — FUROSEMIDE 10 MG/ML IJ SOLN
20.0000 mg | Freq: Every day | INTRAMUSCULAR | Status: DC
  Filled 2021-07-14: qty 2

## 2021-07-14 NOTE — Progress Notes (Signed)
[facility] transport has been called and scheduled to have patient at [facility] by 0830 for stress test Saturday morning.

## 2021-07-14 NOTE — Consult Note (Signed)
Reason for Consult: Decompensated systolic congestive heart failure Referring Physician: [facility] hospitalist  Lynn Estrada is an 61 y.o. female.  HPI: Patient is 61 year old female with past medical history significant hypertension, GERD, history of nephrolithiasis requiring right partial nephrectomy in the past, came to ER complaining of progressive increasing shortness of breath associated with coughing for last 3 to 4 weeks patient does give history of PND orthopnea and ankle swelling.  Denies any chest pain but complains of pleuritic chest pain.  Denies any fever or chills.  Patient denies any excessive salty food intake.  In the ED patient was noted to be hypoxic on room air received IV Lasix with good diuresis subsequently had 2D echo done which showed global hypokinesis EF with EF of 20 to 25%.  Patient states she is compliant to her blood pressure medicine takes amlodipine daily and sees her PMD once a year and does not monitor blood pressure at home.  Denies any alcohol abuse.  Denies any drug abuse.  Denies any history of MI in the past.  EKG done in the ED showed normal sinus rhythm with left bundle branch block.  BNP was elevated to 394 high-sensitivity troponin high was 28 and repeat was 25 which was trending down.  Past Medical History:  Diagnosis Date   Anemia    Eczema    GERD (gastroesophageal reflux disease)    History of adenomatous polyp of colon    08-03-2016  tubular adenoma x2 and hyperplastic   History of chronic gastritis 08/03/2016   History of ectopic pregnancy 1988   s/p  left salpingectomy   History of endometriosis    History of kidney stones    History of partial nephrectomy    right pelvis for very large stone   History of pneumonia 07/02/2016   CAP   History of sepsis    07-01-2016  sepsis w/ pyelonephritis, CAP /   12-31-2016  urosepsis w/ kidney stone obstruction   History of uterine leiomyoma    Hypertension    Left ureteral stone    Nephrolithiasis     left obstructive stone and right non-obstructive stone  per CT 12-31-2016   Urgency of urination     Past Surgical History:  Procedure Laterality Date   ABDOMINAL HYSTERECTOMY  01/20/2007   w/ Lysis Adhesions/  Left salpingoophorectomy/  Right Salpingectomy   CHOLECYSTECTOMY  1994   and Right Partial Nephrectomy (pelvis for very large stone)   CYSTOSCOPY W/ URETERAL STENT PLACEMENT Left 12/31/2016   Procedure: CYSTOSCOPY WITH RETROGRADE PYELOGRAM/ LEFT URETERAL STENT PLACEMENT;  Surgeon: Alexis Frock, MD;  Location: [facility];  Service: Urology;  Laterality: Left;   CYSTOSCOPY WITH RETROGRADE PYELOGRAM, URETEROSCOPY AND STENT PLACEMENT Left 01/18/2017   Procedure: 1ST STAGE CYSTOSCOPY WITH RETROGRADE PYELOGRAM, URETEROSCOPY AND STENT REPLACEMENT;  Surgeon: Alexis Frock, MD;  Location: [facility];  Service: Urology;  Laterality: Left;   CYSTOSCOPY WITH RETROGRADE PYELOGRAM, URETEROSCOPY AND STENT PLACEMENT Left 02/01/2017   Procedure: 2ND STAGE CYSTOSCOPY WITH RETROGRADE PYELOGRAM, URETEROSCOPY AND STENT REPLACEMENT;  Surgeon: Alexis Frock, MD;  Location: [facility];  Service: Urology;  Laterality: Left;   ECTOPIC PREGNANCY SURGERY  1988   Left Salpingectomy   HOLMIUM LASER APPLICATION Left 0/98/1191   Procedure: HOLMIUM LASER APPLICATION;  Surgeon: Alexis Frock, MD;  Location: [facility];  Service: Urology;  Laterality: Left;   HOLMIUM LASER APPLICATION Left 10/06/8293   Procedure: HOLMIUM LASER APPLICATION;  Surgeon: Alexis Frock, MD;  Location:  [place];  Service: Urology;  Laterality: Left;   LUMBAR DISC SURGERY  01/1999   right L5 -- S1   RE-EXPLORATION LUMBAR/  LAMINECTOMY AND MICRODISKECTOMY  10/14/2001   right L5 -- S1   TUBAL LIGATION Bilateral 60/04/9322   PPTL   UMBILICAL HERNIA REPAIR  age 6    Family History  Problem Relation Age of Onset   Hypertension Sister    Eczema Sister    Lupus Sister     Sarcoidosis Mother    Prostate cancer Father    Gout Maternal Grandmother    Diabetes Maternal Grandmother        leg amputations   Breast cancer Maternal Aunt    Stomach cancer Neg Hx    Colon cancer Neg Hx     Social History:  reports that she quit smoking about 24 years ago. Her smoking use included cigarettes. She has a 3.75 pack-year smoking history. She has never used smokeless tobacco. She reports current alcohol use. She reports that she does not use drugs.  Allergies:  Allergies  Allergen Reactions   Bactrim [Sulfamethoxazole-Trimethoprim] Rash   Latex Rash    Medications: I have reviewed the patient's current medications.  Results for orders placed or performed during the hospital encounter of 07/12/21 (from the past 48 hour(s))  Troponin I (High Sensitivity)     Status: Abnormal   Collection Time: 07/12/21 12:16 PM  Result Value Ref Range   Troponin I (High Sensitivity) 25 (H) <18 ng/L    Comment: (NOTE) Elevated high sensitivity troponin I (hsTnI) values and significant  changes across serial measurements may suggest ACS but many other  chronic and acute conditions are known to elevate hsTnI results.  Refer to the "Links" section for chest pain algorithms and additional  guidance. Performed at [facility], [place] [address] [place], [place] [address]   Hepatic function panel     Status: Abnormal   Collection Time: 07/12/21 12:16 PM  Result Value Ref Range   Total Protein 6.5 6.5 - 8.1 g/dL   Albumin 3.5 3.5 - 5.0 g/dL   AST 24 15 - 41 U/L   ALT 39 0 - 44 U/L   Alkaline Phosphatase 49 38 - 126 U/L   Total Bilirubin 1.2 0.3 - 1.2 mg/dL   Bilirubin, Direct 0.2 0.0 - 0.2 mg/dL   Indirect Bilirubin 1.0 (H) 0.3 - 0.9 mg/dL    Comment: Performed at [facility], [place] [address] [place], [place] [address]  Magnesium     Status: None   Collection Time: 07/12/21 12:16 PM  Result Value Ref Range   Magnesium 1.9 1.7 - 2.4 mg/dL     Comment: Performed at [facility], [place] [address], [place], [place] [address]  HIV Antibody (routine testing w rflx)     Status: None   Collection Time: 07/13/21  3:29 AM  Result Value Ref Range   HIV Screen 4th Generation wRfx Non Reactive Non Reactive    Comment: Performed at [facility], [place] [address], [place], [place] [address]  Basic metabolic panel     Status: Abnormal   Collection Time: 07/13/21  3:29 AM  Result Value Ref Range   Sodium 140 135 - 145 mmol/L   Potassium 3.8 3.5 - 5.1 mmol/L   Chloride 106 98 - 111 mmol/L   CO2 25 22 - 32 mmol/L   Glucose, Bld 105 (H) 70 - 99 mg/dL  Comment: Glucose reference range applies only to samples taken after fasting for at least 8 hours.   BUN 17 6 - 20 mg/dL   Creatinine, Ser 0.85 0.44 - 1.00 mg/dL   Calcium 8.4 (L) 8.9 - 10.3 mg/dL   GFR, Estimated >60 >60 mL/min    Comment: (NOTE) Calculated using the CKD-EPI Creatinine Equation (2021)    Anion gap 9 5 - 15    Comment: Performed at [facility], [place] [address] [place], [place] [address]  Procalcitonin - Baseline     Status: None   Collection Time: 07/13/21  4:47 PM  Result Value Ref Range   Procalcitonin <0.10 ng/mL    Comment:        Interpretation: PCT (Procalcitonin) <= 0.5 ng/mL: Systemic infection (sepsis) is not likely. Local bacterial infection is possible. (NOTE)       Sepsis PCT Algorithm           Lower Respiratory Tract                                      Infection PCT Algorithm    ----------------------------     ----------------------------         PCT < 0.25 ng/mL                PCT < 0.10 ng/mL          Strongly encourage             Strongly discourage   discontinuation of antibiotics    initiation of antibiotics    ----------------------------     -----------------------------       PCT 0.25 - 0.50 ng/mL            PCT 0.10 - 0.25 ng/mL               OR       >80% decrease in PCT            Discourage  initiation of                                            antibiotics      Encourage discontinuation           of antibiotics    ----------------------------     -----------------------------         PCT >= 0.50 ng/mL              PCT 0.26 - 0.50 ng/mL               AND        <80% decrease in PCT             Encourage initiation of                                             antibiotics       Encourage continuation           of antibiotics    ----------------------------     -----------------------------        PCT >= 0.50 ng/mL  PCT > 0.50 ng/mL               AND         increase in PCT                  Strongly encourage                                      initiation of antibiotics    Strongly encourage escalation           of antibiotics                                     -----------------------------                                           PCT <= 0.25 ng/mL                                                 OR                                        > 80% decrease in PCT                                      Discontinue / Do not initiate                                             antibiotics  Performed at [place] [address], [place], [place] [address]   Respiratory (~20 pathogens) panel by PCR     Status: None   Collection Time: 07/13/21 10:09 PM   Specimen: Nasopharyngeal Swab; Respiratory  Result Value Ref Range   Adenovirus NOT DETECTED NOT DETECTED   Coronavirus 229E NOT DETECTED NOT DETECTED    Comment: (NOTE) The Coronavirus on the Respiratory Panel, DOES NOT test for the novel  Coronavirus (2019 nCoV)    Coronavirus HKU1 NOT DETECTED NOT DETECTED   Coronavirus NL63 NOT DETECTED NOT DETECTED   Coronavirus OC43 NOT DETECTED NOT DETECTED   Metapneumovirus NOT DETECTED NOT DETECTED   Rhinovirus / Enterovirus NOT DETECTED NOT DETECTED   Influenza A NOT DETECTED NOT DETECTED   Influenza B NOT DETECTED NOT DETECTED    Parainfluenza Virus 1 NOT DETECTED NOT DETECTED   Parainfluenza Virus 2 NOT DETECTED NOT DETECTED   Parainfluenza Virus 3 NOT DETECTED NOT DETECTED   Parainfluenza Virus 4 NOT DETECTED NOT DETECTED   Respiratory Syncytial Virus NOT DETECTED NOT DETECTED   Bordetella pertussis NOT DETECTED NOT DETECTED   Bordetella Parapertussis NOT DETECTED NOT DETECTED   Chlamydophila pneumoniae NOT DETECTED NOT DETECTED   Mycoplasma pneumoniae NOT DETECTED NOT DETECTED    Comment: Performed at [facility], [place]  [address] [place], [place] [address]  Hemoglobin A1c     Status: Abnormal   Collection Time: 07/14/21  3:35 AM  Result Value Ref Range   Hgb A1c MFr Bld 6.2 (H) 4.8 - 5.6 %    Comment: (NOTE) Pre diabetes:          5.7%-6.4%  Diabetes:              >6.4%  Glycemic control for   <7.0% adults with diabetes    Mean Plasma Glucose 131.24 mg/dL    Comment: Performed at [place] [address] [place], [place] [address]  Lipid panel     Status: Abnormal   Collection Time: 07/14/21  3:35 AM  Result Value Ref Range   Cholesterol 121 0 - 200 mg/dL   Triglycerides 95 <150 mg/dL   HDL 32 (L) >40 mg/dL   Total CHOL/HDL Ratio 3.8 RATIO   VLDL 19 0 - 40 mg/dL   LDL Cholesterol 70 0 - 99 mg/dL    Comment:        Total Cholesterol/HDL:CHD Risk Coronary Heart Disease Risk Table                     Men   Women  1/2 Average Risk   3.4   3.3  Average Risk       5.0   4.4  2 X Average Risk   9.6   7.1  3 X Average Risk  23.4   11.0        Use the calculated Patient Ratio above and the CHD Risk Table to determine the patient's CHD Risk.        ATP III CLASSIFICATION (LDL):  <100     mg/dL   Optimal  100-129  mg/dL   Near or Above                    Optimal  130-159  mg/dL   Borderline  160-189  mg/dL   High  >190     mg/dL   Very High Performed at [facility] [address], [place], [place] [address]   Renal function panel     Status: Abnormal    Collection Time: 07/14/21  3:35 AM  Result Value Ref Range   Sodium 137 135 - 145 mmol/L   Potassium 3.3 (L) 3.5 - 5.1 mmol/L   Chloride 103 98 - 111 mmol/L   CO2 24 22 - 32 mmol/L   Glucose, Bld 106 (H) 70 - 99 mg/dL    Comment: Glucose reference range applies only to samples taken after fasting for at least 8 hours.   BUN 21 (H) 6 - 20 mg/dL   Creatinine, Ser 0.77 0.44 - 1.00 mg/dL   Calcium 8.5 (L) 8.9 - 10.3 mg/dL   Phosphorus 3.8 2.5 - 4.6 mg/dL   Albumin 3.5 3.5 - 5.0 g/dL   GFR, Estimated >60 >60 mL/min    Comment: (NOTE) Calculated using the CKD-EPI Creatinine Equation (2021)    Anion gap 10 5 - 15    Comment: Performed at [facility], [place] [address], [place], [place] [address]  Magnesium     Status: None   Collection Time: 07/14/21  3:35 AM  Result Value Ref Range   Magnesium 2.1 1.7 - 2.4 mg/dL    Comment: Performed at [facility], [place] [address], [place], [place] [address]  Brain natriuretic peptide  Status: Abnormal   Collection Time: 07/14/21  3:35 AM  Result Value Ref Range   B Natriuretic Peptide 247.1 (H) 0.0 - 100.0 pg/mL    Comment: Performed at [facility], [place] [address] [place], [place] [address]  CBC     Status: Abnormal   Collection Time: 07/14/21  3:35 AM  Result Value Ref Range   WBC 6.3 4.0 - 10.5 K/uL   RBC 4.00 3.87 - 5.11 MIL/uL   Hemoglobin 11.0 (L) 12.0 - 15.0 g/dL   HCT 35.4 (L) 36.0 - 46.0 %   MCV 88.5 80.0 - 100.0 fL   MCH 27.5 26.0 - 34.0 pg   MCHC 31.1 30.0 - 36.0 g/dL   RDW 14.9 11.5 - 15.5 %   Platelets 280 150 - 400 K/uL   nRBC 0.0 0.0 - 0.2 %    Comment: Performed at [facility], [place] [address], [place], [place] [address]  Vitamin B12     Status: None   Collection Time: 07/14/21  3:35 AM  Result Value Ref Range   Vitamin B-12 427 180 - 914 pg/mL    Comment: (NOTE) This assay is not validated for testing neonatal or myeloproliferative syndrome  specimens for Vitamin B12 levels. Performed at [facility], [place] [address], [place], [place] [address]   Folate     Status: None   Collection Time: 07/14/21  3:35 AM  Result Value Ref Range   Folate 41.8 >5.9 ng/mL    Comment: RESULTS CONFIRMED BY MANUAL DILUTION Performed at [place] [address] [place], [place] [address]   Iron and TIBC     Status: None   Collection Time: 07/14/21  3:35 AM  Result Value Ref Range   Iron 38 28 - 170 ug/dL   TIBC 266 250 - 450 ug/dL   Saturation Ratios 14 10.4 - 31.8 %   UIBC 228 ug/dL    Comment: Performed at [facility], [place] [address], [place], [place] [address]  Ferritin     Status: None   Collection Time: 07/14/21  3:35 AM  Result Value Ref Range   Ferritin 164 11 - 307 ng/mL    Comment: Performed at [facility], [place] [address] [place], [place] [address]  Reticulocytes     Status: Abnormal   Collection Time: 07/14/21  3:35 AM  Result Value Ref Range   Retic Ct Pct 1.7 0.4 - 3.1 %   RBC. 4.01 3.87 - 5.11 MIL/uL   Retic Count, Absolute 69.0 19.0 - 186.0 K/uL   Immature Retic Fract 24.4 (H) 2.3 - 15.9 %    Comment: Performed at [facility], [place] [address], [place], [place] [address]  TSH     Status: None   Collection Time: 07/14/21  3:35 AM  Result Value Ref Range   TSH 0.642 0.350 - 4.500 uIU/mL    Comment: Performed by a 3rd Generation assay with a functional sensitivity of <=0.01 uIU/mL. Performed at [facility] [place], [place] [address] [place], [place] [address]   Procalcitonin     Status: None   Collection Time: 07/14/21  3:35 AM  Result Value Ref Range   Procalcitonin <0.10 ng/mL    Comment:        Interpretation: PCT (Procalcitonin) <= 0.5 ng/mL: Systemic infection (sepsis) is not likely. Local bacterial infection is possible. (NOTE)       Sepsis PCT Algorithm  Lower Respiratory Tract                                       Infection PCT Algorithm    ----------------------------     ----------------------------         PCT < 0.25 ng/mL                PCT < 0.10 ng/mL          Strongly encourage             Strongly discourage   discontinuation of antibiotics    initiation of antibiotics    ----------------------------     -----------------------------       PCT 0.25 - 0.50 ng/mL            PCT 0.10 - 0.25 ng/mL               OR       >80% decrease in PCT            Discourage initiation of                                            antibiotics      Encourage discontinuation           of antibiotics    ----------------------------     -----------------------------         PCT >= 0.50 ng/mL              PCT 0.26 - 0.50 ng/mL               AND        <80% decrease in PCT             Encourage initiation of                                             antibiotics       Encourage continuation           of antibiotics    ----------------------------     -----------------------------        PCT >= 0.50 ng/mL                  PCT > 0.50 ng/mL               AND         increase in PCT                  Strongly encourage                                      initiation of antibiotics    Strongly encourage escalation           of antibiotics                                     -----------------------------  PCT <= 0.25 ng/mL                                                 OR                                        > 80% decrease in PCT                                      Discontinue / Do not initiate                                             antibiotics  Performed at [facility] [address], [place], [place] [address]     ECHOCARDIOGRAM COMPLETE  Result Date: 07/13/2021    ECHOCARDIOGRAM REPORT   Patient Name:   Lynn Estrada [place] Date of Exam: 07/13/2021 Medical Rec #:  [id number]         Height:       73.0 in Accession  #:    [id number]        Weight:       207.9 lb Date of Birth:  [birth year]        BSA:          2.187 m Patient Age:    59 years          BP:           119/77 mmHg Patient Gender: F                 HR:           93 bpm. Exam Location:  Inpatient Procedure: 2D Echo, Cardiac Doppler and Color Doppler Indications:    CHF  History:        Patient has no prior history of Echocardiogram examinations.                 Risk Factors:Hypertension. GERD.  Sonographer:    Beryle Beams Referring Phys: [id number] DAVID MANUEL Galliano  1. Left ventricular ejection fraction, by estimation, is 20 to 25%. The left ventricle has severely decreased function. The left ventricle demonstrates global hypokinesis with septal-lateral dyssynchrony consistent with LBBB. The left ventricular internal cavity size was moderately dilated. Left ventricular diastolic parameters are consistent with Grade II diastolic dysfunction (pseudonormalization).  2. Right ventricular systolic function is normal. The right ventricular size is normal. Tricuspid regurgitation signal is inadequate for assessing PA pressure.  3. Left atrial size was moderately dilated.  4. The mitral valve is normal in structure. Trivial mitral valve regurgitation. No evidence of mitral stenosis.  5. The aortic valve is tricuspid. Aortic valve regurgitation is trivial. No aortic stenosis is present.  6. The inferior vena cava is normal in size with <50% respiratory variability, suggesting right atrial pressure of 8 mmHg. FINDINGS  Left Ventricle: Left ventricular ejection fraction, by estimation, is 20 to 25%. The left ventricle has severely decreased function. The left ventricle demonstrates global hypokinesis. The left ventricular internal cavity size was moderately dilated. There is  no left ventricular hypertrophy. Left ventricular diastolic parameters are consistent with Grade II diastolic dysfunction (pseudonormalization). Right Ventricle: The right ventricular size is  normal. No increase in right ventricular wall thickness. Right ventricular systolic function is normal. Tricuspid regurgitation signal is inadequate for assessing PA pressure. Left Atrium: Left atrial size was moderately dilated. Right Atrium: Right atrial size was normal in size. Pericardium: Trivial pericardial effusion is present. Mitral Valve: The mitral valve is normal in structure. Trivial mitral valve regurgitation. No evidence of mitral valve stenosis. Tricuspid Valve: The tricuspid valve is normal in structure. Tricuspid valve regurgitation is not demonstrated. Aortic Valve: The aortic valve is tricuspid. Aortic valve regurgitation is trivial. Aortic regurgitation PHT measures 408 msec. No aortic stenosis is present. Aortic valve mean gradient measures 7.0 mmHg. Aortic valve peak gradient measures 11.4 mmHg. Aortic valve area, by VTI measures 3.02 cm. Pulmonic Valve: The pulmonic valve was normal in structure. Pulmonic valve regurgitation is not visualized. Aorta: The aortic root is normal in size and structure. Venous: The inferior vena cava is normal in size with less than 50% respiratory variability, suggesting right atrial pressure of 8 mmHg. IAS/Shunts: No atrial level shunt detected by color flow Doppler.  LEFT VENTRICLE PLAX 2D LVIDd:         6.70 cm      Diastology LVIDs:         5.80 cm      LV e' medial:  5.11 cm/s LV PW:         1.10 cm      LV e' lateral: 11.60 cm/s LV IVS:        0.80 cm LVOT diam:     2.10 cm LV SV:         95 LV SV Index:   43 LVOT Area:     3.46 cm  LV Volumes (MOD) LV vol d, MOD A2C: 187.0 ml LV vol d, MOD A4C: 159.0 ml LV vol s, MOD A2C: 132.0 ml LV vol s, MOD A4C: 105.5 ml LV SV MOD A2C:     55.0 ml LV SV MOD A4C:     159.0 ml LV SV MOD BP:      62.9 ml RIGHT VENTRICLE             IVC RV S prime:     12.00 cm/s  IVC diam: 1.80 cm TAPSE (M-mode): 1.8 cm LEFT ATRIUM              Index        RIGHT ATRIUM           Index LA diam:        4.40 cm  2.01 cm/m   RA Area:      16.10 cm LA Vol (A2C):   84.3 ml  38.54 ml/m  RA Volume:   46.60 ml  21.30 ml/m LA Vol (A4C):   112.0 ml 51.20 ml/m LA Biplane Vol: 100.0 ml 45.72 ml/m  AORTIC VALVE                     PULMONIC VALVE AV Area (Vmax):    3.28 cm      PV Vmax:       0.67 m/s AV Area (Vmean):   2.60 cm      PV Peak grad:  1.8 mmHg AV Area (VTI):     3.02 cm AV Vmax:           169.00 cm/s AV Vmean:  126.000 cm/s AV VTI:            0.313 m AV Peak Grad:      11.4 mmHg AV Mean Grad:      7.0 mmHg LVOT Vmax:         160.00 cm/s LVOT Vmean:        94.700 cm/s LVOT VTI:          0.273 m LVOT/AV VTI ratio: 0.87 AI PHT:            408 msec  AORTA Ao Root diam: 2.90 cm Ao Asc diam:  2.80 cm  SHUNTS Systemic VTI:  0.27 m Systemic Diam: 2.10 cm Dalton McleanMD Electronically signed by Franki Monte Signature Date/Time: 07/13/2021/5:23:32 PM    Final     Review of Systems  Constitutional:  Positive for fatigue. Negative for diaphoresis.  HENT:  Positive for sore throat.   Respiratory:  Positive for cough and shortness of breath.   Cardiovascular:  Positive for leg swelling. Negative for palpitations.  Gastrointestinal:  Negative for abdominal pain.  Genitourinary:  Negative for difficulty urinating.  Neurological:  Negative for dizziness.  Blood pressure 94/64, pulse (!) 106, temperature 99.7 F (37.6 C), temperature source Oral, resp. rate 20, height 6\' 1"  (1.854 m), weight 93.6 kg, SpO2 93 %. Physical Exam Constitutional:      Appearance: She is well-developed.  Eyes:     Extraocular Movements: Extraocular movements intact.     Pupils: Pupils are equal, round, and reactive to light.  Neck:     Vascular: No JVD.  Cardiovascular:     Comments: Regular rate and rhythm S1-S2 normal status 2/6 systolic murmur noted and paradoxical splitting of second heart sound Pulmonary:     Comments: Decreased breath sound at bases no rhonchi or rails noted no wheezing. Abdominal:     General: Bowel sounds are normal.      Palpations: Abdomen is soft.  Musculoskeletal:     Comments: No clubbing cyanosis or edema  Neurological:     General: No focal deficit present.     Mental Status: She is alert and oriented to person, place, and time.    Assessment/Plan: Resolving acute decompensated systolic heart failure Hypertensive heart disease with systolic dysfunction rule out ischemia Minimally elevated high-sensitivity troponin I secondary to above Left bundle branch block GERD History of nephrolithiasis in the past status post right partial nephrectomy.  Prediabetic Plan Discussed with patient regarding 2D echo finding and various options of treatment for ischemic work-up i.e. invasive left cardiac catheterization versus nuclear stress testing patient wants to proceed with stress testing first. Will start low dose Entresto as blood pressure tolerates Schedule for nuclear stress test in a.m. Restrict fluid to 1 L per 24 hours Strict I/O's Daily weights    Charolette Forward 07/14/2021, 12:06 PM

## 2021-07-14 NOTE — Progress Notes (Signed)
PROGRESS NOTE  Lynn Estrada HFW:[id number] DOB: 1960/12/26   PCP: [name], PA  Patient is from: Home.  Independently ambulates at baseline.  DOA: 07/12/2021 LOS: 1  Chief complaints:  Chief Complaint  Patient presents with   Cough   Shortness of Breath     Brief Narrative / Interim history: 61 year old F with PMH of HTN, anemia, GERD, gastritis, gastric polyps and UTI related to large nephrolithiasis requiring right partial nephrectomy presenting with progressive dyspnea, DOE, productive cough with yellowish phlegm, headache, myalgia, orthopnea, PND and LE edema, and admitted for acute hypoxic RF due to acute CHF.  Desaturated to 84% in ED requiring 2 L to maintain saturation in 90s.  BNP 394.  Troponin 28>> 25.  EKG sinus rhythm with LBBB (chronic).  CXR with new moderate interstitial pulmonary edema.  Started on IV Lasix.    TTE with LVEF of 20 to 25%, GH, G2 DD and normal RVSP.  Cardiology consulted.  Plan for nuclear stress test  Subjective: Seen and examined earlier this morning.  Continues to endorse orthopnea requiring oxygen at night.  Also felt lightheaded with ambulation bathroom.  She denies chest pain.  Denies shortness of breath at rest but DOE.  Objective: Vitals:   07/13/21 2300 07/14/21 0500 07/14/21 1033 07/14/21 1234  BP:   94/64 (!) 96/58  Pulse:    73  Resp: 20     Temp:    98.8 F (37.1 C)  TempSrc:    Oral  SpO2:    92%  Weight:  93.6 kg    Height:        Examination:  GENERAL: No apparent distress.  Nontoxic. HEENT: MMM.  Vision and hearing grossly intact.  NECK: Supple.  No apparent JVD.  RESP: 92% on RA at rest.  No IWOB.  Fair aeration bilaterally. CVS:  RRR. Heart sounds normal.  ABD/GI/GU: BS+. Abd soft, NTND.  MSK/EXT:  Moves extremities. No apparent deformity. No edema.  SKIN: no apparent skin lesion or wound NEURO: Awake and alert. Oriented appropriately.  No apparent focal neuro deficit. PSYCH: Calm. Normal affect.    Procedures:  None  Microbiology summarized: OYDXA-12 and influenza PCR nonreactive. RVP negative.  Assessment & Plan: New onset acute combined CHF: TE with LVEF of 20 to 25%, GH, G2 DD and normal RVSP.  Risk factor HTN.  Presented with cardinal symptoms.  BNP elevated to 394>> 247.  Started on IV Lasix with some improvement in her breathing.  About 2.2 L UOP/24 hours.  Continues to endorse orthopnea.  Also lightheaded with ambulation.  Soft blood pressures. -Cardiology consulted and following -Plan for nuclear stress test in the morning (1/14) -On Lasix 20 mg daily -GDMT-low-dose Entresto and metoprolol started -Monitor fluid status, renal functions and electrolytes. -Advised against further NSAID use -Consider outpatient sleep study to rule out OSA  Acute respiratory failure with hypoxia due to the above.   -Management as above -Wean oxygen as able  Elevated troponin/chronic LBBB-likely demand ischemia from the above. -Cardiology planning nuclear stress test in the morning   Essential hypertension: Soft blood pressures. -Discontinue home amlodipine -Cardiac meds as above.   Normocytic anemia: H&H at baseline.  Denies melena or hematochezia.  Anemia panel normal. Recent Labs    07/12/21 1045 07/14/21 0335  HGB 11.4* 11.0*  -Monitor H&H  Mild hypocalcemia -Monitor  Hypokalemia: K3.3.  Mg 2.1. -P.o. KCl 40x2  Overweight Body mass index is 27.22 kg/m. Nutrition Problem: Increased nutrient needs Etiology: acute illness Signs/Symptoms:  estimated needs Interventions: Magic cup, MVI   DVT prophylaxis:  enoxaparin (LOVENOX) injection 40 mg Start: 07/12/21 1800  Code Status: Full code Family Communication: Patient and/or RN. Available if any question.  Level of care: Telemetry Status is: Inpatient  Remains inpatient appropriate because: Acute hypoxic respiratory failure due to acute CHF requiring diuretics and supplemental oxygen and further cardiac  evaluation       Consultants:  Cardiology   Sch Meds:  Scheduled Meds:  aspirin EC  81 mg Oral Daily   atorvastatin  10 mg Oral Daily   enoxaparin (LOVENOX) injection  40 mg Subcutaneous Q24H   furosemide  20 mg Intravenous Daily   mouth rinse  15 mL Mouth Rinse BID   metoprolol tartrate  12.5 mg Oral BID   multivitamin with minerals  1 tablet Oral Daily   pneumococcal 20-valent conjugate vaccine  0.5 mL Intramuscular Tomorrow-1000   sacubitril-valsartan  1 tablet Oral BID   senna  1 tablet Oral QHS   Continuous Infusions: PRN Meds:.acetaminophen **OR** acetaminophen, guaiFENesin-dextromethorphan, ondansetron **OR** ondansetron (ZOFRAN) IV  Antimicrobials: Anti-infectives (From admission, onward)    None        I have personally reviewed the following labs and images: CBC: Recent Labs  Lab 07/12/21 1045 07/14/21 0335  WBC 7.8 6.3  HGB 11.4* 11.0*  HCT 36.4 35.4*  MCV 88.3 88.5  PLT 298 280   BMP &GFR Recent Labs  Lab 07/12/21 1045 07/12/21 1216 07/13/21 0329 07/14/21 0335  NA 139  --  140 137  K 3.5  --  3.8 3.3*  CL 109  --  106 103  CO2 25  --  25 24  GLUCOSE 97  --  105* 106*  BUN 15  --  17 21*  CREATININE 0.79  --  0.85 0.77  CALCIUM 8.6*  --  8.4* 8.5*  MG  --  1.9  --  2.1  PHOS  --   --   --  3.8   Estimated Creatinine Clearance: 98.9 mL/min (by C-G formula based on SCr of 0.77 mg/dL). Liver & Pancreas: Recent Labs  Lab 07/12/21 1216 07/14/21 0335  AST 24  --   ALT 39  --   ALKPHOS 49  --   BILITOT 1.2  --   PROT 6.5  --   ALBUMIN 3.5 3.5   No results for input(s): LIPASE, AMYLASE in the last 168 hours. No results for input(s): AMMONIA in the last 168 hours. Diabetic: Recent Labs    07/14/21 0335  HGBA1C 6.2*   No results for input(s): GLUCAP in the last 168 hours. Cardiac Enzymes: No results for input(s): CKTOTAL, CKMB, CKMBINDEX, TROPONINI in the last 168 hours. No results for input(s): PROBNP in the last 8760  hours. Coagulation Profile: No results for input(s): INR, PROTIME in the last 168 hours. Thyroid Function Tests: Recent Labs    07/14/21 0335  TSH 0.642   Lipid Profile: Recent Labs    07/14/21 0335  CHOL 121  HDL 32*  LDLCALC 70  TRIG 95  CHOLHDL 3.8   Anemia Panel: Recent Labs    07/14/21 0335  VITAMINB12 427  FOLATE 41.8  FERRITIN 164  TIBC 266  IRON 38  RETICCTPCT 1.7   Urine analysis:    Component Value Date/Time   COLORURINE YELLOW 06/11/2019 1314   APPEARANCEUR HAZY (A) 06/11/2019 1314   LABSPEC 1.016 06/11/2019 1314   PHURINE 5.0 06/11/2019 1314   GLUCOSEU NEGATIVE 06/11/2019 1314   HGBUR NEGATIVE  06/11/2019 1314   [place] 06/11/2019 1314   BILIRUBINUR negative 12/07/2015 1226   KETONESUR 20 (A) 06/11/2019 1314   PROTEINUR 30 (A) 06/11/2019 1314   UROBILINOGEN 0.2 12/07/2015 1226   NITRITE NEGATIVE 06/11/2019 1314   LEUKOCYTESUR TRACE (A) 06/11/2019 1314   Sepsis Labs: Invalid input(s): PROCALCITONIN, [place]  Microbiology: Recent Results (from the past 240 hour(s))  Resp Panel by RT-PCR (Flu A&B, Covid) Nasopharyngeal Swab     Status: None   Collection Time: 07/12/21 10:48 AM   Specimen: Nasopharyngeal Swab; Nasopharyngeal(NP) swabs in vial transport medium  Result Value Ref Range Status   SARS Coronavirus 2 by RT PCR NEGATIVE NEGATIVE Final    Comment: (NOTE) SARS-CoV-2 target nucleic acids are NOT DETECTED.  The SARS-CoV-2 RNA is generally detectable in upper respiratory specimens during the acute phase of infection. The lowest concentration of SARS-CoV-2 viral copies this assay can detect is 138 copies/mL. A negative result does not preclude SARS-Cov-2 infection and should not be used as the sole basis for treatment or other patient management decisions. A negative result may occur with  improper specimen collection/handling, submission of specimen other than nasopharyngeal swab, presence of viral mutation(s) within  the areas targeted by this assay, and inadequate number of viral copies(<138 copies/mL). A negative result must be combined with clinical observations, patient history, and epidemiological information. The expected result is Negative.  Fact Sheet for Patients:  EntrepreneurPulse.com.au  Fact Sheet for Healthcare Providers:  IncredibleEmployment.be  This test is no t yet approved or cleared by the [place] FDA and  has been authorized for detection and/or diagnosis of SARS-CoV-2 by FDA under an Emergency Use Authorization (EUA). This EUA will remain  in effect (meaning this test can be used) for the duration of the COVID-19 declaration under Section 564(b)(1) of the Act, 21 U.S.C.section 360bbb-3(b)(1), unless the authorization is terminated  or revoked sooner.       Influenza A by PCR NEGATIVE NEGATIVE Final   Influenza B by PCR NEGATIVE NEGATIVE Final    Comment: (NOTE) The Xpert Xpress SARS-CoV-2/FLU/RSV plus assay is intended as an aid in the diagnosis of influenza from Nasopharyngeal swab specimens and should not be used as a sole basis for treatment. Nasal washings and aspirates are unacceptable for Xpert Xpress SARS-CoV-2/FLU/RSV testing.  Fact Sheet for Patients: EntrepreneurPulse.com.au  Fact Sheet for Healthcare Providers: IncredibleEmployment.be  This test is not yet approved or cleared by the [place] FDA and has been authorized for detection and/or diagnosis of SARS-CoV-2 by FDA under an Emergency Use Authorization (EUA). This EUA will remain in effect (meaning this test can be used) for the duration of the COVID-19 declaration under Section 564(b)(1) of the Act, 21 U.S.C. section 360bbb-3(b)(1), unless the authorization is terminated or revoked.  Performed at [facility], [place] [address], [place], [place] [address]   Respiratory (~20 pathogens) panel by PCR      Status: None   Collection Time: 07/13/21 10:09 PM   Specimen: Nasopharyngeal Swab; Respiratory  Result Value Ref Range Status   Adenovirus NOT DETECTED NOT DETECTED Final   Coronavirus 229E NOT DETECTED NOT DETECTED Final    Comment: (NOTE) The Coronavirus on the Respiratory Panel, DOES NOT test for the novel  Coronavirus (2019 nCoV)    Coronavirus HKU1 NOT DETECTED NOT DETECTED Final   Coronavirus NL63 NOT DETECTED NOT DETECTED Final   Coronavirus OC43 NOT DETECTED NOT DETECTED Final   Metapneumovirus NOT DETECTED NOT DETECTED Final   Rhinovirus /  Enterovirus NOT DETECTED NOT DETECTED Final   Influenza A NOT DETECTED NOT DETECTED Final   Influenza B NOT DETECTED NOT DETECTED Final   Parainfluenza Virus 1 NOT DETECTED NOT DETECTED Final   Parainfluenza Virus 2 NOT DETECTED NOT DETECTED Final   Parainfluenza Virus 3 NOT DETECTED NOT DETECTED Final   Parainfluenza Virus 4 NOT DETECTED NOT DETECTED Final   Respiratory Syncytial Virus NOT DETECTED NOT DETECTED Final   Bordetella pertussis NOT DETECTED NOT DETECTED Final   Bordetella Parapertussis NOT DETECTED NOT DETECTED Final   Chlamydophila pneumoniae NOT DETECTED NOT DETECTED Final   Mycoplasma pneumoniae NOT DETECTED NOT DETECTED Final    Comment: Performed at [facility], [place] [address], [place], [place] [address]    Radiology Studies: No results found.    Esterlene Atiyeh T. [place]  If 7PM-7AM, please contact night-coverage [email or web address] 07/14/2021, 2:47 PM

## 2021-07-15 ENCOUNTER — Inpatient Hospital Stay (HOSPITAL_COMMUNITY): Payer: BC Managed Care – PPO

## 2021-07-15 ENCOUNTER — Ambulatory Visit (HOSPITAL_COMMUNITY)
Admit: 2021-07-15 | Discharge: 2021-07-15 | Disposition: A | Discharge status: Home / Self Care | Payer: BC Managed Care – PPO | Attending: Cardiology | Admitting: Cardiology

## 2021-07-15 LAB — RENAL FUNCTION PANEL
Albumin: 3.3 g/dL — ABNORMAL LOW (ref 3.5–5.0)
Anion gap: 5 (ref 5–15)
BUN: 22 mg/dL — ABNORMAL HIGH (ref 6–20)
CO2: 29 mmol/L (ref 22–32)
Calcium: 8.6 mg/dL — ABNORMAL LOW (ref 8.9–10.3)
Chloride: 106 mmol/L (ref 98–111)
Creatinine, Ser: 0.87 mg/dL (ref 0.44–1.00)
GFR, Estimated: >60 mL/min (ref >60)
Glucose, Bld: 122 mg/dL — ABNORMAL HIGH (ref 70–99)
Phosphorus: 3.7 mg/dL (ref 2.5–4.6)
Potassium: 4 mmol/L (ref 3.5–5.1)
Sodium: 140 mmol/L (ref 135–145)

## 2021-07-15 LAB — PROCALCITONIN: Procalcitonin: <0.1 ng/mL

## 2021-07-15 LAB — MAGNESIUM: Magnesium: 2.2 mg/dL (ref 1.7–2.4)

## 2021-07-15 LAB — HEMOGLOBIN AND HEMATOCRIT, BLOOD
HCT: 33.7 % — ABNORMAL LOW (ref 36.0–46.0)
Hemoglobin: 10.4 g/dL — ABNORMAL LOW (ref 12.0–15.0)

## 2021-07-15 MED ORDER — TECHNETIUM TC 99M TETROFOSMIN IV KIT
10.5000 | PACK | Freq: Once | INTRAVENOUS | Status: AC | PRN
  Administered 2021-07-15: 10.5 via INTRAVENOUS

## 2021-07-15 MED ORDER — TECHNETIUM TC 99M TETROFOSMIN IV KIT: 31.1000 | PACK | Freq: Once | INTRAVENOUS | Status: DC | PRN

## 2021-07-15 MED ORDER — REGADENOSON 0.4 MG/5ML IV SOLN
INTRAVENOUS | Status: AC
  Administered 2021-07-15: 0.4 mg via INTRAVENOUS
  Filled 2021-07-15: qty 5

## 2021-07-15 MED ORDER — FUROSEMIDE 10 MG/ML IJ SOLN
20.0000 mg | Freq: Two times a day (BID) | INTRAMUSCULAR | Status: DC
  Administered 2021-07-15: 20 mg via INTRAVENOUS
  Filled 2021-07-15 (×2): qty 2

## 2021-07-15 MED ORDER — EMPAGLIFLOZIN 10 MG PO TABS
10.0000 mg | ORAL_TABLET | Freq: Every day | ORAL | Status: DC
  Administered 2021-07-15 – 2021-07-20 (×6): 10 mg via ORAL
  Filled 2021-07-15 (×6): qty 1

## 2021-07-15 MED ORDER — REGADENOSON 0.4 MG/5ML IV SOLN
0.4000 mg | Freq: Once | INTRAVENOUS | Status: AC
Start: 2021-07-15 — End: 2021-07-15

## 2021-07-15 MED ORDER — FUROSEMIDE 10 MG/ML IJ SOLN: 40.0000 mg | Freq: Once | INTRAMUSCULAR | Status: AC

## 2021-07-15 MED ORDER — FUROSEMIDE 10 MG/ML IJ SOLN
INTRAMUSCULAR | Status: AC
  Administered 2021-07-15: 40 mg via INTRAVENOUS
  Filled 2021-07-15: qty 4

## 2021-07-15 MED ORDER — POTASSIUM CHLORIDE CRYS ER 20 MEQ PO TBCR
20.0000 meq | EXTENDED_RELEASE_TABLET | Freq: Once | ORAL | Status: AC
  Administered 2021-07-15: 20 meq via ORAL
  Filled 2021-07-15: qty 1

## 2021-07-15 NOTE — Progress Notes (Signed)
Patient brought to Stress Lab from [place]. Feels SOB, denies chest pain. Spo2 92-94%, resp rate 24. BP 120/82 and heart rate 86 sinus rhythm, occasional PVC. Dr Terrence Dupont advised of SOB and spo2 on his arrival to stress lab. He examined patient. Lexiscan stress test done, Patient felt pain at IV site during [place] being given.No swelling noted until post IV Lasix. Small quarter sized lump noted.Patient very SOB during test and Lasix 40mg  IV given at 1052. Patient felt back to baseline at at 1056. Patient voided on arrival back to Stress Lab. IV team requested to start IV. Spo2 in Stress Lab post void was 98% on room air. Dr Terrence Dupont called re Se K 3.3 this am. See orders, Potassium given.

## 2021-07-15 NOTE — Progress Notes (Signed)
Patient waiting for [facility] to transport back to [facility]. Patient started on 2L 02 as Spo2 92-94% and more SOB when lying flat to go under the camera. Report given to Heda, nurse at [facility] re her SOB, Lasix and potassium given.

## 2021-07-15 NOTE — Progress Notes (Signed)
Subjective:  Seen in nuclear medicine department denies any chest pain complains of mild shortness of breath.  O2 sats 94 to 97% on room air.  Developed shortness of breath during nuclear stress test received IV Lasix 40 mg with improvement in her symptoms.  Patient did not receive Entresto yesterday due to low blood pressure.  Objective:  Vital Signs in the last 24 hours: Temp:  [98.6 F (37 C)-98.8 F (37.1 C)] 98.6 F (37 C) (01/14 0450) Pulse Rate:  [73-88] 87 (01/14 0450) Resp:  [15-24] 24 (01/14 1039) BP: (96-120)/(58-82) 120/82 (01/14 1039) SpO2:  [92 %-99 %] 94 % (01/14 1039)  Intake/Output from previous day: 01/13 0701 - 01/14 0700 In: 120 [P.O.:120] Out: 500 [Urine:500] Intake/Output from this shift: No intake/output data recorded.  Physical Exam: Neck: no adenopathy, no carotid bruit, no JVD, and supple, symmetrical, trachea midline Lungs: Decreased breath sound at bases Heart: regular rate and rhythm, S1, S2 normal, and 2/6 systolic murmur and paradoxical splitting of second heart sound noted Abdomen: soft, non-tender; bowel sounds normal; no masses,  no organomegaly Extremities: extremities normal, atraumatic, no cyanosis or edema  Lab Results: Recent Labs    07/14/21 0335 07/15/21 0420  WBC 6.3  --   HGB 11.0* 10.4*  PLT 280  --    Recent Labs    07/14/21 0335 07/15/21 0420  NA 137 140  K 3.3* 4.0  CL 103 106  CO2 24 29  GLUCOSE 106* 122*  BUN 21* 22*  CREATININE 0.77 0.87   No results for input(s): TROPONINI in the last 72 hours.  Invalid input(s): CK, MB Hepatic Function Panel Recent Labs    07/12/21 1216 07/14/21 0335 07/15/21 0420  PROT 6.5  --   --   ALBUMIN 3.5   < > 3.3*  AST 24  --   --   ALT 39  --   --   ALKPHOS 49  --   --   BILITOT 1.2  --   --   BILIDIR 0.2  --   --   IBILI 1.0*  --   --    < > = values in this interval not displayed.   Recent Labs    07/14/21 0335  CHOL 121   No results for input(s): PROTIME in the  last 72 hours.  Imaging: No results found.  Cardiac Studies:  Assessment/Plan:  Resolving acute decompensated systolic heart failure Hypertensive heart disease with systolic dysfunction rule out ischemia Minimally elevated high-sensitivity troponin I secondary to above Left bundle branch block GERD History of nephrolithiasis in the past status post right partial nephrectomy.  Prediabetic Plan Hold metoprolol tartrate for now Continue Entresto and IV Lasix for now and will switch Lasix to spironolactone as blood pressure tolerates once fully compensated Will start carvedilol spironolactone as blood pressure tolerates Add SGLT 2 inhibitor as per orders Check nuclear stress test results  LOS: 2 days    Charolette Forward 07/15/2021, 11:02 AM

## 2021-07-15 NOTE — Progress Notes (Signed)
[facility] PROGRESS NOTE    Progress Note  [name]  DPO:[id number] DOB: Nov 24, 1960 DOA: 07/12/2021 PCP: [name], PA     Brief Narrative:   Lynn Estrada is an 61 y.o. female past medical history significant for essential hypertension, gastritis UTI related to nephrolithiasis requiring prior right partial nephrectomy presents with progressive dyspnea and shortness of breath productive cough, PND and lower extremity edema was found to be satting 84% in the ED placed on 2 L saturations improved BNP of 400 troponins have remained flat EKG showed chronic left bundle branch block, chest x-ray showed bilateral pulmonary infiltrates 2D echo was done that showed an EF of 20 to 25% with grade 2 diastolic heart failure cardiology was consulted for nuclear stress test  Assessment/Plan:   Acute respiratory failure with hypoxia secondary to new acute combined systolic and diastolic heart failure: With an EF of 20% started on IV Lasix and her breathing has improved. She is -2-1/2 L, cardiology was consulted for nuclear stress test done this morning 07/15/2021.0 She is currently on Entresto low-dose metoprolol for blood pressure has remained relatively stable. Monitor and replete electrolytes as needed her creatinine has remained stable. Avoid NSAIDs. Strict I's and O's and daily weights. Further management per cards  Elevated troponin/chronic left bundle branch block: Cardiac biomarkers have remained relatively flat, cardiology was consulted and she is scheduled for stress test this morning.  Essential hypertension: Amlodipine was discontinued, she is started on low-dose metoprolol and Entresto.  Normocytic anemia: Her hemoglobin has been as baseline.  Mild hypocalcemia: Continue to monitor.  Mild hypokalemia: Repleted orally now resolved.  Obesity with a BMI greater than 25.    DVT prophylaxis: lovenox Family Communication:none Status is:  Inpatient  Remains inpatient appropriate because: new heart failure        Code Status:     Code Status Orders  (From admission, onward)           Start     Ordered   07/12/21 1309  Full code  Continuous        07/12/21 1309           Code Status History     Date Active Date Inactive Code Status Order ID Comments User Context   06/11/2019 1948 06/17/2019 1644 Full Code [id number]  Etta Quill, DO ED   12/31/2016 1958 01/02/2017 1618 Full Code [id number]  Alexis Frock, MD Inpatient   07/02/2016 0902 07/07/2016 1732 Full Code [id number]  Cristal Ford, DO ED         IV Access:   Peripheral IV   Procedures and diagnostic studies:   ECHOCARDIOGRAM COMPLETE  Result Date: 07/13/2021    ECHOCARDIOGRAM REPORT   Patient Name:   Lynn Estrada Gallery Date of Exam: 07/13/2021 Medical Rec #:  [id number]         Height:       73.0 in Accession #:    [id number]        Weight:       207.9 lb Date of Birth:  Jan 06, 1961        BSA:          2.187 m Patient Age:    47 years          BP:           119/77 mmHg Patient Gender: F                 HR:  93 bpm. Exam Location:  Inpatient Procedure: 2D Echo, Cardiac Doppler and Color Doppler Indications:    CHF  History:        Patient has no prior history of Echocardiogram examinations.                 Risk Factors:Hypertension. GERD.  Sonographer:    Beryle Beams Referring Phys: [id number] DAVID MANUEL [place]  1. Left ventricular ejection fraction, by estimation, is 20 to 25%. The left ventricle has severely decreased function. The left ventricle demonstrates global hypokinesis with septal-lateral dyssynchrony consistent with LBBB. The left ventricular internal cavity size was moderately dilated. Left ventricular diastolic parameters are consistent with Grade II diastolic dysfunction (pseudonormalization).  2. Right ventricular systolic function is normal. The right ventricular size is normal. Tricuspid regurgitation signal is  inadequate for assessing PA pressure.  3. Left atrial size was moderately dilated.  4. The mitral valve is normal in structure. Trivial mitral valve regurgitation. No evidence of mitral stenosis.  5. The aortic valve is tricuspid. Aortic valve regurgitation is trivial. No aortic stenosis is present.  6. The inferior vena cava is normal in size with <50% respiratory variability, suggesting right atrial pressure of 8 mmHg. FINDINGS  Left Ventricle: Left ventricular ejection fraction, by estimation, is 20 to 25%. The left ventricle has severely decreased function. The left ventricle demonstrates global hypokinesis. The left ventricular internal cavity size was moderately dilated. There is no left ventricular hypertrophy. Left ventricular diastolic parameters are consistent with Grade II diastolic dysfunction (pseudonormalization). Right Ventricle: The right ventricular size is normal. No increase in right ventricular wall thickness. Right ventricular systolic function is normal. Tricuspid regurgitation signal is inadequate for assessing PA pressure. Left Atrium: Left atrial size was moderately dilated. Right Atrium: Right atrial size was normal in size. Pericardium: Trivial pericardial effusion is present. Mitral Valve: The mitral valve is normal in structure. Trivial mitral valve regurgitation. No evidence of mitral valve stenosis. Tricuspid Valve: The tricuspid valve is normal in structure. Tricuspid valve regurgitation is not demonstrated. Aortic Valve: The aortic valve is tricuspid. Aortic valve regurgitation is trivial. Aortic regurgitation PHT measures 408 msec. No aortic stenosis is present. Aortic valve mean gradient measures 7.0 mmHg. Aortic valve peak gradient measures 11.4 mmHg. Aortic valve area, by VTI measures 3.02 cm. Pulmonic Valve: The pulmonic valve was normal in structure. Pulmonic valve regurgitation is not visualized. Aorta: The aortic root is normal in size and structure. Venous: The inferior  vena cava is normal in size with less than 50% respiratory variability, suggesting right atrial pressure of 8 mmHg. IAS/Shunts: No atrial level shunt detected by color flow Doppler.  LEFT VENTRICLE PLAX 2D LVIDd:         6.70 cm      Diastology LVIDs:         5.80 cm      LV e' medial:  5.11 cm/s LV PW:         1.10 cm      LV e' lateral: 11.60 cm/s LV IVS:        0.80 cm LVOT diam:     2.10 cm LV SV:         95 LV SV Index:   43 LVOT Area:     3.46 cm  LV Volumes (MOD) LV vol d, MOD A2C: 187.0 ml LV vol d, MOD A4C: 159.0 ml LV vol s, MOD A2C: 132.0 ml LV vol s, MOD A4C: 105.5 ml LV SV MOD A2C:  55.0 ml LV SV MOD A4C:     159.0 ml LV SV MOD BP:      62.9 ml RIGHT VENTRICLE             IVC RV S prime:     12.00 cm/s  IVC diam: 1.80 cm TAPSE (M-mode): 1.8 cm LEFT ATRIUM              Index        RIGHT ATRIUM           Index LA diam:        4.40 cm  2.01 cm/m   RA Area:     16.10 cm LA Vol (A2C):   84.3 ml  38.54 ml/m  RA Volume:   46.60 ml  21.30 ml/m LA Vol (A4C):   112.0 ml 51.20 ml/m LA Biplane Vol: 100.0 ml 45.72 ml/m  AORTIC VALVE                     PULMONIC VALVE AV Area (Vmax):    3.28 cm      PV Vmax:       0.67 m/s AV Area (Vmean):   2.60 cm      PV Peak grad:  1.8 mmHg AV Area (VTI):     3.02 cm AV Vmax:           169.00 cm/s AV Vmean:          126.000 cm/s AV VTI:            0.313 m AV Peak Grad:      11.4 mmHg AV Mean Grad:      7.0 mmHg LVOT Vmax:         160.00 cm/s LVOT Vmean:        94.700 cm/s LVOT VTI:          0.273 m LVOT/AV VTI ratio: 0.87 AI PHT:            408 msec  AORTA Ao Root diam: 2.90 cm Ao Asc diam:  2.80 cm  SHUNTS Systemic VTI:  0.27 m Systemic Diam: 2.10 cm Dalton McleanMD Electronically signed by Franki Monte Signature Date/Time: 07/13/2021/5:23:32 PM    Final      Medical Consultants:   None.   Subjective:    [place] breathing is improved  Objective:    Vitals:   07/14/21 1033 07/14/21 1234 07/14/21 2102 07/15/21 0450  BP: 94/64 (!) 96/58  101/73 101/61  Pulse:  73 88 87  Resp:   20 15  Temp:  98.8 F (37.1 C) 98.6 F (37 C) 98.6 F (37 C)  TempSrc:  Oral Oral Oral  SpO2:  92% 93% 99%  Weight:      Height:       SpO2: 99 % O2 Flow Rate (L/min): 2 L/min   Intake/Output Summary (Last 24 hours) at 07/15/2021 0833 Last data filed at 07/14/2021 1540 Gross per 24 hour  Intake 120 ml  Output 500 ml  Net -380 ml   Filed Weights   07/12/21 1640 07/13/21 0500 07/14/21 0500  Weight: 94.3 kg 94.3 kg 93.6 kg    Exam: General exam: In no acute distress. Respiratory system: Good air movement and clear to auscultation. Cardiovascular system: S1 & S2 heard, RRR. No JVD. Gastrointestinal system: Abdomen is nondistended, soft and nontender.  Extremities: No pedal edema. Skin: No rashes, lesions or ulcers Psychiatry: Judgement and insight appear normal. Mood & affect appropriate.  Data Reviewed:    Labs: Basic Metabolic Panel: Recent Labs  Lab 07/12/21 1045 07/12/21 1216 07/13/21 0329 07/14/21 0335 07/15/21 0420  NA 139  --  140 137 140  K 3.5  --  3.8 3.3* 4.0  CL 109  --  106 103 106  CO2 25  --  25 24 29   GLUCOSE 97  --  105* 106* 122*  BUN 15  --  17 21* 22*  CREATININE 0.79  --  0.85 0.77 0.87  CALCIUM 8.6*  --  8.4* 8.5* 8.6*  MG  --  1.9  --  2.1 2.2  PHOS  --   --   --  3.8 3.7   GFR Estimated Creatinine Clearance: 90.9 mL/min (by C-G formula based on SCr of 0.87 mg/dL). Liver Function Tests: Recent Labs  Lab 07/12/21 1216 07/14/21 0335 07/15/21 0420  AST 24  --   --   ALT 39  --   --   ALKPHOS 49  --   --   BILITOT 1.2  --   --   PROT 6.5  --   --   ALBUMIN 3.5 3.5 3.3*   No results for input(s): LIPASE, AMYLASE in the last 168 hours. No results for input(s): AMMONIA in the last 168 hours. Coagulation profile No results for input(s): INR, PROTIME in the last 168 hours. COVID-19 Labs  Recent Labs    07/14/21 0335  FERRITIN 164    Lab Results  Component Value Date    SARSCOV2NAA NEGATIVE 07/12/2021   SARSCOV2NAA NEGATIVE 06/11/2019   SARSCOV2NAA Detected (A) 06/10/2019   SARSCOV2NAA Comment (A) 06/03/2019    CBC: Recent Labs  Lab 07/12/21 1045 07/14/21 0335 07/15/21 0420  WBC 7.8 6.3  --   HGB 11.4* 11.0* 10.4*  HCT 36.4 35.4* 33.7*  MCV 88.3 88.5  --   PLT 298 280  --    Cardiac Enzymes: No results for input(s): CKTOTAL, CKMB, CKMBINDEX, TROPONINI in the last 168 hours. BNP (last 3 results) No results for input(s): PROBNP in the last 8760 hours. CBG: No results for input(s): GLUCAP in the last 168 hours. D-Dimer: No results for input(s): DDIMER in the last 72 hours. Hgb A1c: Recent Labs    07/14/21 0335  HGBA1C 6.2*   Lipid Profile: Recent Labs    07/14/21 0335  CHOL 121  HDL 32*  LDLCALC 70  TRIG 95  CHOLHDL 3.8   Thyroid function studies: Recent Labs    07/14/21 0335  TSH 0.642   Anemia work up: Recent Labs    07/14/21 0335  VITAMINB12 427  FOLATE 41.8  FERRITIN 164  TIBC 266  IRON 38  RETICCTPCT 1.7   Sepsis Labs: Recent Labs  Lab 07/12/21 1045 07/13/21 1647 07/14/21 0335 07/15/21 0420  PROCALCITON  --  <0.10 <0.10 <0.10  WBC 7.8  --  6.3  --    Microbiology Recent Results (from the past 240 hour(s))  Resp Panel by RT-PCR (Flu A&B, Covid) Nasopharyngeal Swab     Status: None   Collection Time: 07/12/21 10:48 AM   Specimen: Nasopharyngeal Swab; Nasopharyngeal(NP) swabs in vial transport medium  Result Value Ref Range Status   SARS Coronavirus 2 by RT PCR NEGATIVE NEGATIVE Final    Comment: (NOTE) SARS-CoV-2 target nucleic acids are NOT DETECTED.  The SARS-CoV-2 RNA is generally detectable in upper respiratory specimens during the acute phase of infection. The lowest concentration of SARS-CoV-2 viral copies this assay can detect is 138 copies/mL. A  negative result does not preclude SARS-Cov-2 infection and should not be used as the sole basis for treatment or other patient management decisions.  A negative result may occur with  improper specimen collection/handling, submission of specimen other than nasopharyngeal swab, presence of viral mutation(s) within the areas targeted by this assay, and inadequate number of viral copies(<138 copies/mL). A negative result must be combined with clinical observations, patient history, and epidemiological information. The expected result is Negative.  Fact Sheet for Patients:  EntrepreneurPulse.com.au  Fact Sheet for Healthcare Providers:  IncredibleEmployment.be  This test is no t yet approved or cleared by the [place] FDA and  has been authorized for detection and/or diagnosis of SARS-CoV-2 by FDA under an Emergency Use Authorization (EUA). This EUA will remain  in effect (meaning this test can be used) for the duration of the COVID-19 declaration under Section 564(b)(1) of the Act, 21 U.S.C.section 360bbb-3(b)(1), unless the authorization is terminated  or revoked sooner.       Influenza A by PCR NEGATIVE NEGATIVE Final   Influenza B by PCR NEGATIVE NEGATIVE Final    Comment: (NOTE) The Xpert Xpress SARS-CoV-2/FLU/RSV plus assay is intended as an aid in the diagnosis of influenza from Nasopharyngeal swab specimens and should not be used as a sole basis for treatment. Nasal washings and aspirates are unacceptable for Xpert Xpress SARS-CoV-2/FLU/RSV testing.  Fact Sheet for Patients: EntrepreneurPulse.com.au  Fact Sheet for Healthcare Providers: IncredibleEmployment.be  This test is not yet approved or cleared by the [place] FDA and has been authorized for detection and/or diagnosis of SARS-CoV-2 by FDA under an Emergency Use Authorization (EUA). This EUA will remain in effect (meaning this test can be used) for the duration of the COVID-19 declaration under Section 564(b)(1) of the Act, 21 U.S.C. section 360bbb-3(b)(1), unless the authorization  is terminated or revoked.  Performed at [facility], [place] [address], [place], [place] [address]   Respiratory (~20 pathogens) panel by PCR     Status: None   Collection Time: 07/13/21 10:09 PM   Specimen: Nasopharyngeal Swab; Respiratory  Result Value Ref Range Status   Adenovirus NOT DETECTED NOT DETECTED Final   Coronavirus 229E NOT DETECTED NOT DETECTED Final    Comment: (NOTE) The Coronavirus on the Respiratory Panel, DOES NOT test for the novel  Coronavirus (2019 nCoV)    Coronavirus HKU1 NOT DETECTED NOT DETECTED Final   Coronavirus NL63 NOT DETECTED NOT DETECTED Final   Coronavirus OC43 NOT DETECTED NOT DETECTED Final   Metapneumovirus NOT DETECTED NOT DETECTED Final   Rhinovirus / Enterovirus NOT DETECTED NOT DETECTED Final   Influenza A NOT DETECTED NOT DETECTED Final   Influenza B NOT DETECTED NOT DETECTED Final   Parainfluenza Virus 1 NOT DETECTED NOT DETECTED Final   Parainfluenza Virus 2 NOT DETECTED NOT DETECTED Final   Parainfluenza Virus 3 NOT DETECTED NOT DETECTED Final   Parainfluenza Virus 4 NOT DETECTED NOT DETECTED Final   Respiratory Syncytial Virus NOT DETECTED NOT DETECTED Final   Bordetella pertussis NOT DETECTED NOT DETECTED Final   Bordetella Parapertussis NOT DETECTED NOT DETECTED Final   Chlamydophila pneumoniae NOT DETECTED NOT DETECTED Final   Mycoplasma pneumoniae NOT DETECTED NOT DETECTED Final    Comment: Performed at [facility], [place]. [address] [place], [place] [address]     Medications:    aspirin EC  81 mg Oral Daily   atorvastatin  10 mg Oral Daily   enoxaparin (LOVENOX) injection  40 mg Subcutaneous  Q24H   furosemide  20 mg Intravenous Daily   mouth rinse  15 mL Mouth Rinse BID   metoprolol tartrate  12.5 mg Oral BID   multivitamin with minerals  1 tablet Oral Daily   pneumococcal 20-valent conjugate vaccine  0.5 mL Intramuscular Tomorrow-1000   sacubitril-valsartan  1 tablet Oral BID   senna  1 tablet  Oral QHS   Continuous Infusions:    LOS: 2 days   Charlynne Cousins  [facility]  07/15/2021, 8:33 AM

## 2021-07-16 LAB — BASIC METABOLIC PANEL
Anion gap: 8 (ref 5–15)
BUN: 19 mg/dL (ref 6–20)
CO2: 29 mmol/L (ref 22–32)
Calcium: 9 mg/dL (ref 8.9–10.3)
Chloride: 102 mmol/L (ref 98–111)
Creatinine, Ser: 1 mg/dL (ref 0.44–1.00)
GFR, Estimated: >60 mL/min (ref >60)
Glucose, Bld: 120 mg/dL — ABNORMAL HIGH (ref 70–99)
Potassium: 4 mmol/L (ref 3.5–5.1)
Sodium: 139 mmol/L (ref 135–145)

## 2021-07-16 MED ORDER — FUROSEMIDE 20 MG PO TABS
20.0000 mg | ORAL_TABLET | Freq: Two times a day (BID) | ORAL | Status: DC
  Administered 2021-07-17 – 2021-07-18 (×3): 20 mg via ORAL
  Filled 2021-07-16 (×3): qty 1

## 2021-07-16 MED ORDER — FUROSEMIDE 20 MG PO TABS: 20.0000 mg | ORAL_TABLET | Freq: Two times a day (BID) | ORAL | Status: DC

## 2021-07-16 MED ORDER — HYALURONIDASE HUMAN 150 UNIT/ML IJ SOLN
150.0000 [IU] | INTRAMUSCULAR | Status: AC
  Administered 2021-07-16: 150 [IU] via SUBCUTANEOUS
  Filled 2021-07-16: qty 1

## 2021-07-16 MED ORDER — DIGOXIN 125 MCG PO TABS
0.1250 mg | ORAL_TABLET | Freq: Every day | ORAL | Status: DC
  Administered 2021-07-17 – 2021-07-20 (×4): 0.125 mg via ORAL
  Filled 2021-07-16 (×4): qty 1

## 2021-07-16 MED ORDER — DIGOXIN 0.25 MG/ML IJ SOLN
0.2500 mg | Freq: Once | INTRAMUSCULAR | Status: AC
  Administered 2021-07-16: 0.25 mg via INTRAVENOUS
  Filled 2021-07-16: qty 2

## 2021-07-16 NOTE — Progress Notes (Signed)
[facility] PROGRESS NOTE    Progress Note  Lynn Estrada  JHE:[id number] DOB: [birth year] DOA: 07/12/2021 PCP: [name], PA     Brief Narrative:   Lynn Estrada is an 61 y.o. female past medical history significant for essential hypertension, gastritis UTI related to nephrolithiasis requiring prior right partial nephrectomy presentswith progressive dyspnea and shortness of breath productive cough, PND and lower extremity edema was found to be satting 84% in the ED placed on 2 L saturations improved BNP of 400 troponins have remained flat EKG showed chronic left bundle branch block, chest x-ray showed bilateral pulmonary infiltrates 2D echo was done that showed an EF of 20 to 25% with grade 2 diastolic heart failure cardiology was consulted for nuclear stress test  Assessment/Plan:   Acute respiratory failure with hypoxia secondary to new acute combined systolic and diastolic heart failure: With an EF of 20% started on IV Lasix and her breathing has improved. Stress test showed no evidence of ischemia secondary to left ventricular dilation:, EF of 20% probable large  anterior wall defect that is nonreversible You probably need to be on a beta-blocker Change her diuretics to orals. Cardiology on board. Will discuss with cardiology ifr she needs a left heart cath Strict I's and O's and daily weights. Further management per cards  Elevated troponin/chronic left bundle branch block: Cardiac biomarkers have remained relatively flat. 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 And holding metoprolol.  Normocytic anemia: Her hemoglobin has been as baseline.  Mild hypocalcemia: Continue to monitor.  Mild hypokalemia: Repleted orally now resolved.  Obesity with a BMI greater than 25.    DVT prophylaxis: lovenox Family Communication:none Status is:  Inpatient  Remains inpatient appropriate because: new heart failure        Code Status:     Code Status Orders  (From admission, onward)           Start     Ordered   07/12/21 1309  Full code  Continuous        07/12/21 1309           Code Status History     Date Active Date Inactive Code Status Order ID Comments User Context   06/11/2019 1948 06/17/2019 1644 Full Code [id number]  Etta Quill, DO ED   12/31/2016 1958 01/02/2017 1618 Full Code [id number]  Alexis Frock, MD Inpatient   07/02/2016 0902 07/07/2016 1732 Full Code [id number]  Cristal Ford, DO ED         IV Access:   Peripheral IV   Procedures and diagnostic studies:   NM Myocar Multi W/Spect W/Wall Motion / EF  Result Date: 07/15/2021 CLINICAL DATA:  Chest pain.  Shortness of breath. EXAM: MYOCARDIAL IMAGING WITH SPECT (REST AND PHARMACOLOGIC-STRESS) GATED LEFT VENTRICULAR WALL MOTION STUDY LEFT VENTRICULAR EJECTION FRACTION TECHNIQUE: Standard myocardial SPECT imaging was performed after resting intravenous injection of 10.5 mCi Tc-83m tetrofosmin. Subsequently, intravenous infusion of Lexiscan was performed under the supervision of the Cardiology staff. At peak effect of the drug, 31.1 mCi Tc-20m tetrofosmin was injected intravenously and standard myocardial SPECT imaging was performed. Quantitative gated imaging was also performed to evaluate left ventricular wall motion, and estimate left ventricular ejection fraction. COMPARISON:  None. FINDINGS: Perfusion: No reversible myocardial perfusion defects are seen. A large fixed myocardial perfusion defect is seen on both stress and resting images involving the mid and basilar portions of the anterior  wall, likely due to myocardial infarct. A moderate fixed perfusion defect is seen on both stress and resting images involving the inferior wall, which may be due to myocardial infarct or diaphragmatic attenuation Wall Motion: Global left ventricular  hypokinesis is demonstrated, with severe left ventricular dilatation. Left Ventricular Ejection Fraction: 20 % End diastolic volume 720 ml End systolic volume 947 ml IMPRESSION: 1. No evidence of reversible myocardial ischemia. 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 2. Severe left ventricular dilatation and global hypokinesis. 3. Left ventricular ejection fraction 20% 4. Non invasive risk stratification*: High *2012 Appropriate Use Criteria for Coronary Revascularization Focused Update: J Am Coll Cardiol. 0962;83(6):629-476. http://content.[email or web address].aspx?articleid=[email or web address] Electronically Signed   By: Marlaine Hind M.D.   On: 07/15/2021 13:37     Medical Consultants:   None.   Subjective:    Lynn Estrada and really bad mood this morning complaining about everything  Objective:    Vitals:   07/15/21 2113 07/15/21 2117 07/16/21 0500 07/16/21 0518  BP:  (!) 90/55  94/70  Pulse:    87  Resp:    17  Temp:    98.2 F (36.8 C)  TempSrc:    Oral  SpO2:    97%  Weight: 93.9 kg  91.5 kg   Height:       SpO2: 97 % O2 Flow Rate (L/min): 2 L/min   Intake/Output Summary (Last 24 hours) at 07/16/2021 0828 Last data filed at 07/15/2021 2012 Gross per 24 hour  Intake --  Output 2900 ml  Net -2900 ml    Filed Weights   07/14/21 0500 07/15/21 2113 07/16/21 0500  Weight: 93.6 kg 93.9 kg 91.5 kg    Exam: General exam: In no acute distress. Respiratory system: Good air movement and clear to auscultation. Cardiovascular system: S1 & S2 heard, RRR. No JVD. Gastrointestinal system: Abdomen is nondistended, soft and nontender.  Extremities: No pedal edema. Skin: No rashes, lesions or ulcers Psychiatry: Judgement and insight appear normal. Mood & affect appropriate.   Data Reviewed:    Labs: Basic Metabolic Panel: Recent Labs  Lab 07/12/21 1045 07/12/21 1216 07/13/21 0329 07/14/21 0335  07/15/21 0420  NA 139  --  140 137 140  K 3.5  --  3.8 3.3* 4.0  CL 109  --  106 103 106  CO2 25  --  25 24 29   GLUCOSE 97  --  105* 106* 122*  BUN 15  --  17 21* 22*  CREATININE 0.79  --  0.85 0.77 0.87  CALCIUM 8.6*  --  8.4* 8.5* 8.6*  MG  --  1.9  --  2.1 2.2  PHOS  --   --   --  3.8 3.7    GFR Estimated Creatinine Clearance: 89.9 mL/min (by C-G formula based on SCr of 0.87 mg/dL). Liver Function Tests: Recent Labs  Lab 07/12/21 1216 07/14/21 0335 07/15/21 0420  AST 24  --   --   ALT 39  --   --   ALKPHOS 49  --   --   BILITOT 1.2  --   --   PROT 6.5  --   --   ALBUMIN 3.5 3.5 3.3*    No results for input(s): LIPASE, AMYLASE in the last 168 hours. No results for input(s): AMMONIA in the last 168 hours. Coagulation profile No results for input(s): INR, PROTIME in the last 168 hours. COVID-19 Labs  Recent Labs    07/14/21 0335  FERRITIN 164     Lab Results  Component Value Date   SARSCOV2NAA NEGATIVE 07/12/2021   SARSCOV2NAA NEGATIVE 06/11/2019   SARSCOV2NAA Detected (A) 06/10/2019   SARSCOV2NAA Comment (A) 06/03/2019    CBC: Recent Labs  Lab 07/12/21 1045 07/14/21 0335 07/15/21 0420  WBC 7.8 6.3  --   HGB 11.4* 11.0* 10.4*  HCT 36.4 35.4* 33.7*  MCV 88.3 88.5  --   PLT 298 280  --     Cardiac Enzymes: No results for input(s): CKTOTAL, CKMB, CKMBINDEX, TROPONINI in the last 168 hours. BNP (last 3 results) No results for input(s): PROBNP in the last 8760 hours. CBG: No results for input(s): GLUCAP in the last 168 hours. D-Dimer: No results for input(s): DDIMER in the last 72 hours. Hgb A1c: Recent Labs    07/14/21 0335  HGBA1C 6.2*    Lipid Profile: Recent Labs    07/14/21 0335  CHOL 121  HDL 32*  LDLCALC 70  TRIG 95  CHOLHDL 3.8    Thyroid function studies: Recent Labs    07/14/21 0335  TSH 0.642    Anemia work up: Recent Labs    07/14/21 0335  VITAMINB12 427  FOLATE 41.8  FERRITIN 164  TIBC 266  IRON 38   RETICCTPCT 1.7    Sepsis Labs: Recent Labs  Lab 07/12/21 1045 07/13/21 1647 07/14/21 0335 07/15/21 0420  PROCALCITON  --  <0.10 <0.10 <0.10  WBC 7.8  --  6.3  --     Microbiology Recent Results (from the past 240 hour(s))  Resp Panel by RT-PCR (Flu A&B, Covid) Nasopharyngeal Swab     Status: None   Collection Time: 07/12/21 10:48 AM   Specimen: Nasopharyngeal Swab; Nasopharyngeal(NP) swabs in vial transport medium  Result Value Ref Range Status   SARS Coronavirus 2 by RT PCR NEGATIVE NEGATIVE Final    Comment: (NOTE) SARS-CoV-2 target nucleic acids are NOT DETECTED.  The SARS-CoV-2 RNA is generally detectable in upper respiratory specimens during the acute phase of infection. The lowest concentration of SARS-CoV-2 viral copies this assay can detect is 138 copies/mL. A negative result does not preclude SARS-Cov-2 infection and should not be used as the sole basis for treatment or other patient management decisions. A negative result may occur with  improper specimen collection/handling, submission of specimen other than nasopharyngeal swab, presence of viral mutation(s) within the areas targeted by this assay, and inadequate number of viral copies(<138 copies/mL). A negative result must be combined with clinical observations, patient history, and epidemiological information. The expected result is Negative.  Fact Sheet for Patients:  EntrepreneurPulse.com.au  Fact Sheet for Healthcare Providers:  IncredibleEmployment.be  This test is no t yet approved or cleared by the [place] FDA and  has been authorized for detection and/or diagnosis of SARS-CoV-2 by FDA under an Emergency Use Authorization (EUA). This EUA will remain  in effect (meaning this test can be used) for the duration of the COVID-19 declaration under Section 564(b)(1) of the Act, 21 U.S.C.section 360bbb-3(b)(1), unless the authorization is terminated  or revoked  sooner.       Influenza A by PCR NEGATIVE NEGATIVE Final   Influenza B by PCR NEGATIVE NEGATIVE Final    Comment: (NOTE) The Xpert Xpress SARS-CoV-2/FLU/RSV plus assay is intended as an aid in the diagnosis of influenza from Nasopharyngeal swab specimens and should not be used as a sole basis for treatment. Nasal washings and aspirates are unacceptable for Xpert Xpress SARS-CoV-2/FLU/RSV testing.  Fact Sheet for Patients:  EntrepreneurPulse.com.au  Fact Sheet for Healthcare Providers: IncredibleEmployment.be  This test is not yet approved or cleared by the [place] FDA and has been authorized for detection and/or diagnosis of SARS-CoV-2 by FDA under an Emergency Use Authorization (EUA). This EUA will remain in effect (meaning this test can be used) for the duration of the COVID-19 declaration under Section 564(b)(1) of the Act, 21 U.S.C. section 360bbb-3(b)(1), unless the authorization is terminated or revoked.  Performed at [facility], [place] [address] [place], [place] [address]   Respiratory (~20 pathogens) panel by PCR     Status: None   Collection Time: 07/13/21 10:09 PM   Specimen: Nasopharyngeal Swab; Respiratory  Result Value Ref Range Status   Adenovirus NOT DETECTED NOT DETECTED Final   Coronavirus 229E NOT DETECTED NOT DETECTED Final    Comment: (NOTE) The Coronavirus on the Respiratory Panel, DOES NOT test for the novel  Coronavirus (2019 nCoV)    Coronavirus HKU1 NOT DETECTED NOT DETECTED Final   Coronavirus NL63 NOT DETECTED NOT DETECTED Final   Coronavirus OC43 NOT DETECTED NOT DETECTED Final   Metapneumovirus NOT DETECTED NOT DETECTED Final   Rhinovirus / Enterovirus NOT DETECTED NOT DETECTED Final   Influenza A NOT DETECTED NOT DETECTED Final   Influenza B NOT DETECTED NOT DETECTED Final   Parainfluenza Virus 1 NOT DETECTED NOT DETECTED Final   Parainfluenza Virus 2 NOT DETECTED NOT DETECTED  Final   Parainfluenza Virus 3 NOT DETECTED NOT DETECTED Final   Parainfluenza Virus 4 NOT DETECTED NOT DETECTED Final   Respiratory Syncytial Virus NOT DETECTED NOT DETECTED Final   Bordetella pertussis NOT DETECTED NOT DETECTED Final   Bordetella Parapertussis NOT DETECTED NOT DETECTED Final   Chlamydophila pneumoniae NOT DETECTED NOT DETECTED Final   Mycoplasma pneumoniae NOT DETECTED NOT DETECTED Final    Comment: Performed at [facility], [place]. [address], [place], [place] [address]     Medications:    aspirin EC  81 mg Oral Daily   atorvastatin  10 mg Oral Daily   empagliflozin  10 mg Oral Daily   enoxaparin (LOVENOX) injection  40 mg Subcutaneous Q24H   furosemide  20 mg Intravenous BID   mouth rinse  15 mL Mouth Rinse BID   multivitamin with minerals  1 tablet Oral Daily   sacubitril-valsartan  1 tablet Oral BID   senna  1 tablet Oral QHS   Continuous Infusions:    LOS: 3 days   Charlynne Cousins  [facility]  07/16/2021, 8:28 AM

## 2021-07-16 NOTE — Progress Notes (Signed)
Subjective:  Patient denies any anginal chest pain complains of shortness of breath with minimal exertion.  Diuresing well.  States leg swelling has resolved.  Denies PND orthopnea.  Denies palpitation but occasionally feels dizzy.  No syncopal episode.  Discussed with patient and her daughter at length regarding nuclear stress test result.  Objective:  Vital Signs in the last 24 hours: Temp:  [98.2 F (36.8 C)-98.6 F (37 C)] 98.2 F (36.8 C) (01/15 0518) Pulse Rate:  [82-87] 87 (01/15 0518) Resp:  [17-18] 17 (01/15 0518) BP: (80-119)/(51-74) 80/51 (01/15 1010) SpO2:  [96 %-97 %] 97 % (01/15 0518) Weight:  [91.5 kg-93.9 kg] 91.5 kg (01/15 0500)  Intake/Output from previous day: 01/14 0701 - 01/15 0700 In: -  Out: 2900 [Urine:2900] Intake/Output from this shift: No intake/output data recorded.  Physical Exam: Neck: no adenopathy, no carotid bruit, no JVD, and supple, symmetrical, trachea midline Lungs: Decreased breath sound at bases air entry has improved Heart: regular rate and rhythm, S1, S2 normal, and paradoxical splitting of second heart sound Abdomen: soft, non-tender; bowel sounds normal; no masses,  no organomegaly Extremities: extremities normal, atraumatic, no cyanosis or edema  Lab Results: Recent Labs    07/14/21 0335 07/15/21 0420  WBC 6.3  --   HGB 11.0* 10.4*  PLT 280  --    Recent Labs    07/15/21 0420 07/16/21 0916  NA 140 139  K 4.0 4.0  CL 106 102  CO2 29 29  GLUCOSE 122* 120*  BUN 22* 19  CREATININE 0.87 1.00   No results for input(s): TROPONINI in the last 72 hours.  Invalid input(s): CK, MB Hepatic Function Panel Recent Labs    07/15/21 0420  ALBUMIN 3.3*   Recent Labs    07/14/21 0335  CHOL 121   No results for input(s): PROTIME in the last 72 hours.  Imaging: NM Myocar Multi W/Spect W/Wall Motion / EF  Result Date: 07/15/2021 CLINICAL DATA:  Chest pain.  Shortness of breath. EXAM: MYOCARDIAL IMAGING WITH SPECT (REST AND  PHARMACOLOGIC-STRESS) GATED LEFT VENTRICULAR WALL MOTION STUDY LEFT VENTRICULAR EJECTION FRACTION TECHNIQUE: Standard myocardial SPECT imaging was performed after resting intravenous injection of 10.5 mCi Tc-75m tetrofosmin. Subsequently, intravenous infusion of Lexiscan was performed under the supervision of the Cardiology staff. At peak effect of the drug, 31.1 mCi Tc-77m tetrofosmin was injected intravenously and standard myocardial SPECT imaging was performed. Quantitative gated imaging was also performed to evaluate left ventricular wall motion, and estimate left ventricular ejection fraction. COMPARISON:  None. FINDINGS: Perfusion: No reversible myocardial perfusion defects are seen. A large fixed myocardial perfusion defect is seen on both stress and resting images involving the mid and basilar portions of the anterior wall, likely due to myocardial infarct. A moderate fixed perfusion defect is seen on both stress and resting images involving the inferior wall, which may be due to myocardial infarct or diaphragmatic attenuation Wall Motion: Global left ventricular hypokinesis is demonstrated, with severe left ventricular dilatation. Left Ventricular Ejection Fraction: 20 % End diastolic volume 643 ml End systolic volume 329 ml IMPRESSION: 1. No evidence of reversible myocardial ischemia. 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 2. Severe left ventricular dilatation and global hypokinesis. 3. Left ventricular ejection fraction 20% 4. Non invasive risk stratification*: High *2012 Appropriate Use Criteria for Coronary Revascularization Focused Update: J Am Coll Cardiol. 5188;41(6):606-301. http://content.[email or web address].aspx?[email or web address] Electronically Signed   By: Marlaine Hind M.D.   On: 07/15/2021 13:37  Cardiac Studies:  Assessment/Plan:  Resolving acute decompensated systolic heart failure Ischemic/nonischemic dilated  cardiomyopathy negative nuclear stress test with no evidence of ischemia Hypertensive heart disease with systolic dysfunction  Minimally elevated high-sensitivity troponin I secondary to above Left bundle branch block GERD History of nephrolithiasis in the past status post right partial nephrectomy.  Prediabetic Plan Add low-dose digoxin as blood pressure remains soft Continue rest of the medications Will start carvedilol once fully compensated and as blood pressure tolerates we will add spironolactone Will need repeat 2D echo in few months if EF remains below 35% may need CRT ICD, discussed with daughter and patient at length  LOS: 3 days    Lynn Estrada 07/16/2021, 11:08 AM

## 2021-07-17 DIAGNOSIS — I5021 Acute systolic (congestive) heart failure: Secondary | ICD-10-CM

## 2021-07-17 LAB — BASIC METABOLIC PANEL
Anion gap: 8 (ref 5–15)
BUN: 21 mg/dL — ABNORMAL HIGH (ref 6–20)
CO2: 26 mmol/L (ref 22–32)
Calcium: 8.7 mg/dL — ABNORMAL LOW (ref 8.9–10.3)
Chloride: 104 mmol/L (ref 98–111)
Creatinine, Ser: 0.97 mg/dL (ref 0.44–1.00)
GFR, Estimated: >60 mL/min (ref >60)
Glucose, Bld: 109 mg/dL — ABNORMAL HIGH (ref 70–99)
Potassium: 4.1 mmol/L (ref 3.5–5.1)
Sodium: 138 mmol/L (ref 135–145)

## 2021-07-17 MED ORDER — LOSARTAN POTASSIUM 25 MG PO TABS
12.5000 mg | ORAL_TABLET | Freq: Every day | ORAL | Status: DC
  Filled 2021-07-17: qty 1

## 2021-07-17 NOTE — Progress Notes (Signed)
Transfer order placed for Lynn Estrada to transfer to [facility] for further cardiac workup. Report called to Nate RN at [facility] @ 17:40-all questions & concerns addressed. Patient was transferred via [place] w/ belongings (cell phone, clothes, toiletries, bags). Family notified by patient.

## 2021-07-17 NOTE — Progress Notes (Signed)
Subjective:  Patient denies any chest pain, complains of feeling dizzy, lightheaded with standing up.  BP remains soft.  Also complains of coughing, especially while lying down.  Delene Loll was held yesterday because of low blood pressure.  Discussed with patient at length various options of treatment and no increase for further evaluation for heart failure team and possible [facility].  Objective:  Vital Signs in the last 24 hours: Temp:  [97.7 F (36.5 C)-98.2 F (36.8 C)] 98.2 F (36.8 C) (01/16 0432) Pulse Rate:  [64-81] 64 (01/16 0432) Resp:  [18-20] 18 (01/16 0432) BP: (90-99)/(58-75) 99/75 (01/16 0432) SpO2:  [97 %-99 %] 99 % (01/16 0432) Weight:  [92.7 kg] 92.7 kg (01/16 0433)  Intake/Output from previous day: 01/15 0701 - 01/16 0700 In: 1200 [P.O.:1200] Out: 400 [Urine:400] Intake/Output from this shift: No intake/output data recorded.  Physical Exam: Neck: no adenopathy, no carotid bruit, no JVD, and supple, symmetrical, trachea midline Lungs: clear to auscultation bilaterally Heart: regular rate and rhythm, S1, S2 normal, and paradoxical splitting of second heart sound.  Soft S3 gallop noted Abdomen: soft, non-tender; bowel sounds normal; no masses,  no organomegaly Extremities: extremities normal, atraumatic, no cyanosis or edema  Lab Results: Recent Labs    07/15/21 0420  HGB 10.4*   Recent Labs    07/16/21 0916 07/17/21 0323  NA 139 138  K 4.0 4.1  CL 102 104  CO2 29 26  GLUCOSE 120* 109*  BUN 19 21*  CREATININE 1.00 0.97   No results for input(s): TROPONINI in the last 72 hours.  Invalid input(s): CK, MB Hepatic Function Panel Recent Labs    07/15/21 0420  ALBUMIN 3.3*   No results for input(s): CHOL in the last 72 hours. No results for input(s): PROTIME in the last 72 hours.  Imaging: No results found.  Cardiac Studies:  Assessment/Plan:  Resolving acute decompensated systolic heart failure Ischemic/nonischemic dilated  cardiomyopathy negative nuclear stress test with no evidence of ischemia Hypertensive heart disease with systolic dysfunction  Minimally elevated high-sensitivity troponin I secondary to above Left bundle branch block GERD History of nephrolithiasis in the past status post right partial nephrectomy.  Prediabetic Plan DC Entresto In view of low blood pressure. Start losartan 12.5 mg daily. Advanced heart failure team consult   LOS: 4 days    Charolette Forward 07/17/2021, 12:47 PM

## 2021-07-17 NOTE — Plan of Care (Signed)
  Problem: Nutrition: Goal: Adequate nutrition will be maintained Outcome: Progressing   Problem: Coping: Goal: Level of anxiety will decrease Outcome: Progressing   Problem: Elimination: Goal: Will not experience complications related to bowel motility Outcome: Progressing   

## 2021-07-17 NOTE — Progress Notes (Addendum)
[facility] PROGRESS NOTE    Progress Note  Lynn Estrada  WNI:[id number] DOB: 1960/09/02 DOA: 07/12/2021 PCP: [name], PA     Brief Narrative:   Lynn Estrada is an 61 y.o. female past medical history significant for essential hypertension, gastritis UTI related to nephrolithiasis requiring prior right partial nephrectomy presentswith progressive dyspnea and shortness of breath productive cough, PND and lower extremity edema was found to be satting 84% in the ED placed on 2 L saturations improved BNP of 400 troponins have remained flat EKG showed chronic left bundle branch block, chest x-ray showed bilateral pulmonary infiltrates 2D echo was done that showed an EF of 20 to 25% with grade 2 diastolic heart failure cardiology was consulted for nuclear stress test  Assessment/Plan:   Acute respiratory failure with hypoxia secondary to new acute combined systolic and diastolic heart failure: Cardiology on board. Cardiology recommended low-dose dig and to follow-up with them 2 to 4 weeks to start her on a beta-blocker and repeat a 2D echo. After confronting the patient with the nurse she except she has been dizzy upon standing. [facility] and probable heart failure team evaluation. Hold Entresto, she is symptomatic. [facility] already notified and they will see there.  Elevated troponin/chronic left bundle branch block: Cardiac biomarkers have remained relatively flat. 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 Did not tolerate metoprolol.  Normocytic anemia: Her hemoglobin has been as baseline.  Mild hypocalcemia: Continue to monitor.  Mild hypokalemia: Repleted orally now resolved.  Obesity with a BMI greater than 25.    DVT prophylaxis: lovenox Family Communication:none Status is: Inpatient  Remains inpatient appropriate because: new heart  failure        Code Status:     Code Status Orders  (From admission, onward)           Start     Ordered   07/12/21 1309  Full code  Continuous        07/12/21 1309           Code Status History     Date Active Date Inactive Code Status Order ID Comments User Context   06/11/2019 1948 06/17/2019 1644 Full Code [id number]  Etta Quill, DO ED   12/31/2016 1958 01/02/2017 1618 Full Code [id number]  Alexis Frock, MD Inpatient   07/02/2016 0902 07/07/2016 1732 Full Code [id number]  Cristal Ford, DO ED         IV Access:   Peripheral IV   Procedures and diagnostic studies:   NM Myocar Multi W/Spect W/Wall Motion / EF  Result Date: 07/15/2021 CLINICAL DATA:  Chest pain.  Shortness of breath. EXAM: MYOCARDIAL IMAGING WITH SPECT (REST AND PHARMACOLOGIC-STRESS) GATED LEFT VENTRICULAR WALL MOTION STUDY LEFT VENTRICULAR EJECTION FRACTION TECHNIQUE: Standard myocardial SPECT imaging was performed after resting intravenous injection of 10.5 mCi Tc-68m tetrofosmin. Subsequently, intravenous infusion of Lexiscan was performed under the supervision of the Cardiology staff. At peak effect of the drug, 31.1 mCi Tc-60m tetrofosmin was injected intravenously and standard myocardial SPECT imaging was performed. Quantitative gated imaging was also performed to evaluate left ventricular wall motion, and estimate left ventricular ejection fraction. COMPARISON:  None. FINDINGS: Perfusion: No reversible myocardial perfusion defects are seen. A large fixed myocardial perfusion defect is seen on both stress and resting images involving the mid and basilar portions of the anterior wall, likely due to myocardial infarct. A moderate  fixed perfusion defect is seen on both stress and resting images involving the inferior wall, which may be due to myocardial infarct or diaphragmatic attenuation Wall Motion: Global left ventricular hypokinesis is demonstrated, with severe left ventricular dilatation.  Left Ventricular Ejection Fraction: 20 % End diastolic volume 329 ml End systolic volume 924 ml IMPRESSION: 1. No evidence of reversible myocardial ischemia. 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 2. Severe left ventricular dilatation and global hypokinesis. 3. Left ventricular ejection fraction 20% 4. Non invasive risk stratification*: High *2012 Appropriate Use Criteria for Coronary Revascularization Focused Update: J Am Coll Cardiol. 2683;41(9):622-297. http://content.airportbarriers.com.aspx?articleid=[email or web address] Electronically Signed   By: Marlaine Hind M.D.   On: 07/15/2021 13:37     Medical Consultants:   None.   Subjective:    [place] in a better mood this morning, she admitted to being dizzy upon standing.  Objective:    Vitals:   07/16/21 1300 07/16/21 2035 07/17/21 0432 07/17/21 0433  BP: 90/65 (!) 95/58 99/75   Pulse: 81 75 64   Resp: 18 20 18    Temp: 97.7 F (36.5 C) 98.2 F (36.8 C) 98.2 F (36.8 C)   TempSrc: Oral Oral Oral   SpO2: 97% 97% 99%   Weight:    92.7 kg  Height:       SpO2: 99 % O2 Flow Rate (L/min): 2 L/min   Intake/Output Summary (Last 24 hours) at 07/17/2021 0937 Last data filed at 07/16/2021 2227 Gross per 24 hour  Intake 960 ml  Output 400 ml  Net 560 ml    Filed Weights   07/15/21 2113 07/16/21 0500 07/17/21 0433  Weight: 93.9 kg 91.5 kg 92.7 kg    Exam: General exam: In no acute distress. Respiratory system: Good air movement and clear to auscultation. Cardiovascular system: S1 & S2 heard, RRR. No JVD. Gastrointestinal system: Abdomen is nondistended, soft and nontender.  Extremities: No pedal edema. Skin: No rashes, lesions or ulcers Psychiatry: Judgement and insight appear normal. Mood & affect appropriate.   Data Reviewed:    Labs: Basic Metabolic Panel: Recent Labs  Lab 07/12/21 1216 07/13/21 0329 07/14/21 0335 07/15/21 0420 07/16/21 0916  07/17/21 0323  NA  --  140 137 140 139 138  K  --  3.8 3.3* 4.0 4.0 4.1  CL  --  106 103 106 102 104  CO2  --  25 24 29 29 26   GLUCOSE  --  105* 106* 122* 120* 109*  BUN  --  17 21* 22* 19 21*  CREATININE  --  0.85 0.77 0.87 1.00 0.97  CALCIUM  --  8.4* 8.5* 8.6* 9.0 8.7*  MG 1.9  --  2.1 2.2  --   --   PHOS  --   --  3.8 3.7  --   --     GFR Estimated Creatinine Clearance: 81.1 mL/min (by C-G formula based on SCr of 0.97 mg/dL). Liver Function Tests: Recent Labs  Lab 07/12/21 1216 07/14/21 0335 07/15/21 0420  AST 24  --   --   ALT 39  --   --   ALKPHOS 49  --   --   BILITOT 1.2  --   --   PROT 6.5  --   --   ALBUMIN 3.5 3.5 3.3*    No results for input(s): LIPASE, AMYLASE in the last 168 hours. No results for input(s): AMMONIA in the last 168 hours. Coagulation profile No results for  input(s): INR, PROTIME in the last 168 hours. COVID-19 Labs  No results for input(s): DDIMER, FERRITIN, LDH, CRP in the last 72 hours.   Lab Results  Component Value Date   SARSCOV2NAA NEGATIVE 07/12/2021   SARSCOV2NAA NEGATIVE 06/11/2019   SARSCOV2NAA Detected (A) 06/10/2019   SARSCOV2NAA Comment (A) 06/03/2019    CBC: Recent Labs  Lab 07/12/21 1045 07/14/21 0335 07/15/21 0420  WBC 7.8 6.3  --   HGB 11.4* 11.0* 10.4*  HCT 36.4 35.4* 33.7*  MCV 88.3 88.5  --   PLT 298 280  --     Cardiac Enzymes: No results for input(s): CKTOTAL, CKMB, CKMBINDEX, TROPONINI in the last 168 hours. BNP (last 3 results) No results for input(s): PROBNP in the last 8760 hours. CBG: No results for input(s): GLUCAP in the last 168 hours. D-Dimer: No results for input(s): DDIMER in the last 72 hours. Hgb A1c: No results for input(s): HGBA1C in the last 72 hours.  Lipid Profile: No results for input(s): CHOL, HDL, LDLCALC, TRIG, CHOLHDL, LDLDIRECT in the last 72 hours.  Thyroid function studies: No results for input(s): TSH, T4TOTAL, T3FREE, THYROIDAB in the last 72 hours.  Invalid  input(s): FREET3  Anemia work up: No results for input(s): VITAMINB12, FOLATE, FERRITIN, TIBC, IRON, RETICCTPCT in the last 72 hours.  Sepsis Labs: Recent Labs  Lab 07/12/21 1045 07/13/21 1647 07/14/21 0335 07/15/21 0420  PROCALCITON  --  <0.10 <0.10 <0.10  WBC 7.8  --  6.3  --     Microbiology Recent Results (from the past 240 hour(s))  Resp Panel by RT-PCR (Flu A&B, Covid) Nasopharyngeal Swab     Status: None   Collection Time: 07/12/21 10:48 AM   Specimen: Nasopharyngeal Swab; Nasopharyngeal(NP) swabs in vial transport medium  Result Value Ref Range Status   SARS Coronavirus 2 by RT PCR NEGATIVE NEGATIVE Final    Comment: (NOTE) SARS-CoV-2 target nucleic acids are NOT DETECTED.  The SARS-CoV-2 RNA is generally detectable in upper respiratory specimens during the acute phase of infection. The lowest concentration of SARS-CoV-2 viral copies this assay can detect is 138 copies/mL. A negative result does not preclude SARS-Cov-2 infection and should not be used as the sole basis for treatment or other patient management decisions. A negative result may occur with  improper specimen collection/handling, submission of specimen other than nasopharyngeal swab, presence of viral mutation(s) within the areas targeted by this assay, and inadequate number of viral copies(<138 copies/mL). A negative result must be combined with clinical observations, patient history, and epidemiological information. The expected result is Negative.  Fact Sheet for Patients:  EntrepreneurPulse.com.au  Fact Sheet for Healthcare Providers:  IncredibleEmployment.be  This test is no t yet approved or cleared by the [place] FDA and  has been authorized for detection and/or diagnosis of SARS-CoV-2 by FDA under an Emergency Use Authorization (EUA). This EUA will remain  in effect (meaning this test can be used) for the duration of the COVID-19 declaration under  Section 564(b)(1) of the Act, 21 U.S.C.section 360bbb-3(b)(1), unless the authorization is terminated  or revoked sooner.       Influenza A by PCR NEGATIVE NEGATIVE Final   Influenza B by PCR NEGATIVE NEGATIVE Final    Comment: (NOTE) The Xpert Xpress SARS-CoV-2/FLU/RSV plus assay is intended as an aid in the diagnosis of influenza from Nasopharyngeal swab specimens and should not be used as a sole basis for treatment. Nasal washings and aspirates are unacceptable for Xpert Xpress SARS-CoV-2/FLU/RSV testing.  Fact Sheet  for Patients: EntrepreneurPulse.com.au  Fact Sheet for Healthcare Providers: IncredibleEmployment.be  This test is not yet approved or cleared by the [place] FDA and has been authorized for detection and/or diagnosis of SARS-CoV-2 by FDA under an Emergency Use Authorization (EUA). This EUA will remain in effect (meaning this test can be used) for the duration of the COVID-19 declaration under Section 564(b)(1) of the Act, 21 U.S.C. section 360bbb-3(b)(1), unless the authorization is terminated or revoked.  Performed at [facility], [place] [address], [place], [place] [address]   Respiratory (~20 pathogens) panel by PCR     Status: None   Collection Time: 07/13/21 10:09 PM   Specimen: Nasopharyngeal Swab; Respiratory  Result Value Ref Range Status   Adenovirus NOT DETECTED NOT DETECTED Final   Coronavirus 229E NOT DETECTED NOT DETECTED Final    Comment: (NOTE) The Coronavirus on the Respiratory Panel, DOES NOT test for the novel  Coronavirus (2019 nCoV)    Coronavirus HKU1 NOT DETECTED NOT DETECTED Final   Coronavirus NL63 NOT DETECTED NOT DETECTED Final   Coronavirus OC43 NOT DETECTED NOT DETECTED Final   Metapneumovirus NOT DETECTED NOT DETECTED Final   Rhinovirus / Enterovirus NOT DETECTED NOT DETECTED Final   Influenza A NOT DETECTED NOT DETECTED Final   Influenza B NOT DETECTED NOT DETECTED  Final   Parainfluenza Virus 1 NOT DETECTED NOT DETECTED Final   Parainfluenza Virus 2 NOT DETECTED NOT DETECTED Final   Parainfluenza Virus 3 NOT DETECTED NOT DETECTED Final   Parainfluenza Virus 4 NOT DETECTED NOT DETECTED Final   Respiratory Syncytial Virus NOT DETECTED NOT DETECTED Final   Bordetella pertussis NOT DETECTED NOT DETECTED Final   Bordetella Parapertussis NOT DETECTED NOT DETECTED Final   Chlamydophila pneumoniae NOT DETECTED NOT DETECTED Final   Mycoplasma pneumoniae NOT DETECTED NOT DETECTED Final    Comment: Performed at [facility], [place]. [address], [place], [place] [address]     Medications:    aspirin EC  81 mg Oral Daily   atorvastatin  10 mg Oral Daily   digoxin  0.125 mg Oral Daily   empagliflozin  10 mg Oral Daily   enoxaparin (LOVENOX) injection  40 mg Subcutaneous Q24H   furosemide  20 mg Oral BID   mouth rinse  15 mL Mouth Rinse BID   multivitamin with minerals  1 tablet Oral Daily   sacubitril-valsartan  1 tablet Oral BID   senna  1 tablet Oral QHS   Continuous Infusions:    LOS: 4 days   Charlynne Cousins  [facility]  07/17/2021, 9:37 AM

## 2021-07-17 NOTE — Consult Note (Signed)
Advanced Heart Failure Team Consult Note   Primary Physician: [name], PA PCP-Cardiologist:  None  Reason for Consultation: Heart Failure   HPI:    Lynn Estrada is seen today for evaluation of heart failure at the request of Dr Terrence Dupont.   Ms Kallen is a 61 year old with a history of HTN, GERD, nephrolithiasis, and partial right nephrectomy.   Presented to [facility] 07/12/21 with progressive shortness of breath. O2 sats < 85%. Placed on oxygen with improvement. HS Trop 28>25. Sars 2 negative, and BNP 394. Diuresed with IV lasix. 07/13/21 Echo 20-25%, RV normal, grade II DD , and septal -lateral dyssnchrony. Cardiology consulted. Offered cath versus nuclear stress to help with etiology. She elected stress test. On 07/15/20 she was transferred to [facility] for stress test. During the test she developed shortness of breath and was given IV lasix. Stress test reported as negative.   Today she was seen by Dr Terrence Dupont and was complaining of dizziness and coughing while lying down. Entresto had been held due to soft BP and losartan was started.   Dr Terrence Dupont requested transfer to [facility] for Advanced Heart Failure Team to consult. SBP 80-90s. Overall diuresed 16 pounds.  Renal function has remained stable.    Review of Systems: [y] = yes, [ ]  = no   General: Weight gain [ ] ; Weight loss [ ] ; Anorexia [ ] ; Fatigue [ ] ; Fever [ ] ; Chills [ ] ; Weakness [ ]   Cardiac: Chest pain/pressure [ ] ; Resting SOB [ ] ; Exertional SOB [ ] ; Orthopnea [Y ]; Pedal Edema [ ] ; Palpitations [ ] ; Syncope [ ] ; Presyncope [ ] ; Paroxysmal nocturnal dyspnea[ ]   Pulmonary: Cough [ ] ; Wheezing[ ] ; Hemoptysis[ ] ; Sputum [ ] ; Snoring [ ]   GI: Vomiting[ ] ; Dysphagia[ ] ; Melena[ ] ; Hematochezia [ ] ; Heartburn[ ] ; Abdominal pain [ ] ; Constipation [ ] ; Diarrhea [ ] ; BRBPR [ ]   GU: Hematuria[ ] ; Dysuria [ ] ; Nocturia[ ]   Vascular: Pain in legs with walking [ ] ; Pain in feet with lying flat [ ] ; Non-healing sores [ ] ; Stroke [ ] ; TIA [ ] ;  Slurred speech [ ] ;  Neuro: Headaches[ ] ; Vertigo[ ] ; Seizures[ ] ; Paresthesias[ ] ;Blurred vision [ ] ; Diplopia [ ] ; Vision changes [ ]   Ortho/Skin: Arthritis [ ] ; Joint pain [ ] ; Muscle pain [ ] ; Joint swelling [ ] ; Back Pain [ ] ; Rash [ ]   Psych: Depression[ ] ; Anxiety[ ]   Heme: Bleeding problems [ ] ; Clotting disorders [ ] ; Anemia [ ]   Endocrine: Diabetes [ ] ; Thyroid dysfunction[ ]   Home Medications Prior to Admission medications   Medication Sig Start Date End Date Taking? Authorizing Provider  amLODipine (NORVASC) 10 MG tablet TAKE 1 TABLET (10 MG TOTAL) BY MOUTH DAILY. 09/05/17  Yes [name], PA  aspirin 81 MG EC tablet 81 mg daily.   Yes [provider]  atorvastatin (LIPITOR) 10 MG tablet Take 10 mg by mouth daily. 05/15/21  Yes [provider]  guaiFENesin-dextromethorphan (ROBITUSSIN DM) 100-10 MG/5ML syrup Take 10 mLs by mouth every 4 (four) hours as needed for cough. Patient taking differently: Take 5 mLs by mouth every 4 (four) hours as needed for cough. 06/17/19  Yes Sheth, Devam P, MD  Multiple Vitamins-Minerals (MULTIVITAMIN ADULT PO) Take 1 tablet by mouth daily.   Yes [provider]  predniSONE (DELTASONE) 10 MG tablet Take 10 mg by mouth See admin instructions. Dose pack 07/06/21  Yes [provider]  Vitamin D, Ergocalciferol, (DRISDOL)  1.25 MG (50000 UT) CAPS capsule Take 50,000 Units by mouth once a week. Mondays 03/10/19  Yes [provider]  albuterol (VENTOLIN HFA) 108 (90 Base) MCG/ACT inhaler Inhale 2 puffs into the lungs every 6 (six) hours as needed for wheezing or shortness of breath. Patient not taking: Reported on 07/12/2021 06/17/19   Vicenta Dunning, MD  docusate sodium (COLACE) 100 MG capsule Take 1 capsule (100 mg total) by mouth 2 (two) times daily as needed for mild constipation. Patient not taking: Reported on 07/12/2021 06/17/19   Vicenta Dunning, MD  naproxen (NAPROSYN) 500 MG tablet Take 1 tablet (500 mg  total) by mouth 2 (two) times daily. Patient not taking: Reported on 07/12/2021 07/24/20   Suzy Bouchard, PA-C  ondansetron (ZOFRAN ODT) 4 MG disintegrating tablet 4mg  ODT q4 hours prn nausea/vomit Patient not taking: Reported on 07/12/2021 06/29/19   Muthersbaugh, Jarrett Soho, PA-C  senna (SENOKOT) 8.6 MG TABS tablet Take 1 tablet (8.6 mg total) by mouth at bedtime. Patient not taking: Reported on 07/12/2021 06/17/19   Vicenta Dunning, MD    Past Medical History: Past Medical History:  Diagnosis Date   Anemia    Eczema    GERD (gastroesophageal reflux disease)    History of adenomatous polyp of colon    08-03-2016  tubular adenoma x2 and hyperplastic   History of chronic gastritis 08/03/2016   History of ectopic pregnancy 1988   s/p  left salpingectomy   History of endometriosis    History of kidney stones    History of partial nephrectomy    right pelvis for very large stone   History of pneumonia 07/02/2016   CAP   History of sepsis    07-01-2016  sepsis w/ pyelonephritis, CAP /   12-31-2016  urosepsis w/ kidney stone obstruction   History of uterine leiomyoma    Hypertension    Left ureteral stone    Nephrolithiasis    left obstructive stone and right non-obstructive stone  per CT 12-31-2016   Urgency of urination     Past Surgical History: Past Surgical History:  Procedure Laterality Date   ABDOMINAL HYSTERECTOMY  01/20/2007   w/ Lysis Adhesions/  Left salpingoophorectomy/  Right Salpingectomy   CHOLECYSTECTOMY  1994   and Right Partial Nephrectomy (pelvis for very large stone)   CYSTOSCOPY W/ URETERAL STENT PLACEMENT Left 12/31/2016   Procedure: CYSTOSCOPY WITH RETROGRADE PYELOGRAM/ LEFT URETERAL STENT PLACEMENT;  Surgeon: Alexis Frock, MD;  Location: [facility];  Service: Urology;  Laterality: Left;   CYSTOSCOPY WITH RETROGRADE PYELOGRAM, URETEROSCOPY AND STENT PLACEMENT Left 01/18/2017   Procedure: 1ST STAGE CYSTOSCOPY WITH RETROGRADE PYELOGRAM, URETEROSCOPY AND STENT  REPLACEMENT;  Surgeon: Alexis Frock, MD;  Location: [facility];  Service: Urology;  Laterality: Left;   CYSTOSCOPY WITH RETROGRADE PYELOGRAM, URETEROSCOPY AND STENT PLACEMENT Left 02/01/2017   Procedure: 2ND STAGE CYSTOSCOPY WITH RETROGRADE PYELOGRAM, URETEROSCOPY AND STENT REPLACEMENT;  Surgeon: Alexis Frock, MD;  Location: [facility];  Service: Urology;  Laterality: Left;   ECTOPIC PREGNANCY SURGERY  1988   Left Salpingectomy   HOLMIUM LASER APPLICATION Left 5/78/4696   Procedure: HOLMIUM LASER APPLICATION;  Surgeon: Alexis Frock, MD;  Location: [facility];  Service: Urology;  Laterality: Left;   HOLMIUM LASER APPLICATION Left 08/10/5282   Procedure: HOLMIUM LASER APPLICATION;  Surgeon: Alexis Frock, MD;  Location: [facility];  Service: Urology;  Laterality: Left;   LUMBAR DISC SURGERY  01/1999   right L5 --  S1   RE-EXPLORATION LUMBAR/  LAMINECTOMY AND MICRODISKECTOMY  10/14/2001   right L5 -- S1   TUBAL LIGATION Bilateral 01/74/9449   PPTL   UMBILICAL HERNIA REPAIR  age 52    Family History: Family History  Problem Relation Age of Onset   Hypertension Sister    Eczema Sister    Lupus Sister    Sarcoidosis Mother    Prostate cancer Father    Gout Maternal Grandmother    Diabetes Maternal Grandmother        leg amputations   Breast cancer Maternal Aunt    Stomach cancer Neg Hx    Colon cancer Neg Hx     Social History: Social History   Socioeconomic History   Marital status: Married    Spouse name: Richard Labriola   Number of children: 1   Years of education: college   Highest education level: Not on file  Occupational History   Occupation: Higher education careers adviser  Tobacco Use   Smoking status: Former    Packs/day: 0.75    Years: 5.00    Pack years: 3.75    Types: Cigarettes    Quit date: 01/10/1997    Years since quitting: 24.5   Smokeless tobacco: Never  Vaping Use   Vaping Use: Never used   Substance and Sexual Activity   Alcohol use: Yes    Alcohol/week: 0.0 standard drinks    Comment: social use; once a month   Drug use: No   Sexual activity: Yes    Partners: Male    Birth control/protection: Surgical  Other Topics Concern   Not on file  Social History Narrative   Lives with her husband and their daughter and their dog.   Social Determinants of Health   Financial Resource Strain: Not on file  Food Insecurity: Not on file  Transportation Needs: Not on file  Physical Activity: Not on file  Stress: Not on file  Social Connections: Not on file    Allergies:  Allergies  Allergen Reactions   Bactrim [Sulfamethoxazole-Trimethoprim] Rash   Latex Rash    Objective:    Vital Signs:   Temp:  [98.2 F (36.8 C)] 98.2 F (36.8 C) (01/16 0432) Pulse Rate:  [64-75] 64 (01/16 0432) Resp:  [18-20] 18 (01/16 0432) BP: (95-99)/(58-75) 99/75 (01/16 0432) SpO2:  [97 %-99 %] 99 % (01/16 0432) Weight:  [92.7 kg] 92.7 kg (01/16 0433) Last BM Date: 07/14/21  Weight change: Filed Weights   07/15/21 2113 07/16/21 0500 07/17/21 0433  Weight: 93.9 kg 91.5 kg 92.7 kg    Intake/Output:   Intake/Output Summary (Last 24 hours) at 07/17/2021 1416 Last data filed at 07/16/2021 2227 Gross per 24 hour  Intake 840 ml  Output 400 ml  Net 440 ml      Physical Exam    General:  Well appearing. No resp difficulty HEENT: normal Neck: supple. JVP . Carotids 2+ bilat; no bruits. No lymphadenopathy or thyromegaly appreciated. Cor: PMI nondisplaced. Regular rate & rhythm. No rubs, gallops or murmurs. Lungs: clear Abdomen: soft, nontender, nondistended. No hepatosplenomegaly. No bruits or masses. Good bowel sounds. Extremities: no cyanosis, clubbing, rash, edema Neuro: alert & orientedx3, cranial nerves grossly intact. moves all 4 extremities w/o difficulty. Affect pleasant   Telemetry   Sinus 60-70s Personally reviewed  EKG    07/12/21 SR 99 bpm QRS 128 ms   Labs    Basic Metabolic Panel: Recent Labs  Lab 07/12/21 1216 07/13/21 0329 07/14/21 0335 07/15/21 0420 07/16/21 6759  07/17/21 0323  NA  --  140 137 140 139 138  K  --  3.8 3.3* 4.0 4.0 4.1  CL  --  106 103 106 102 104  CO2  --  25 24 29 29 26   GLUCOSE  --  105* 106* 122* 120* 109*  BUN  --  17 21* 22* 19 21*  CREATININE  --  0.85 0.77 0.87 1.00 0.97  CALCIUM  --  8.4* 8.5* 8.6* 9.0 8.7*  MG 1.9  --  2.1 2.2  --   --   PHOS  --   --  3.8 3.7  --   --     Liver Function Tests: Recent Labs  Lab 07/12/21 1216 07/14/21 0335 07/15/21 0420  AST 24  --   --   ALT 39  --   --   ALKPHOS 49  --   --   BILITOT 1.2  --   --   PROT 6.5  --   --   ALBUMIN 3.5 3.5 3.3*   No results for input(s): LIPASE, AMYLASE in the last 168 hours. No results for input(s): AMMONIA in the last 168 hours.  CBC: Recent Labs  Lab 07/12/21 1045 07/14/21 0335 07/15/21 0420  WBC 7.8 6.3  --   HGB 11.4* 11.0* 10.4*  HCT 36.4 35.4* 33.7*  MCV 88.3 88.5  --   PLT 298 280  --     Cardiac Enzymes: No results for input(s): CKTOTAL, CKMB, CKMBINDEX, TROPONINI in the last 168 hours.  BNP: BNP (last 3 results) Recent Labs    07/12/21 1047 07/14/21 0335  BNP 394.3* 247.1*    ProBNP (last 3 results) No results for input(s): PROBNP in the last 8760 hours.   CBG: No results for input(s): GLUCAP in the last 168 hours.  Coagulation Studies: No results for input(s): LABPROT, INR in the last 72 hours.   Imaging   No results found.   Medications:     Current Medications:  aspirin EC  81 mg Oral Daily   atorvastatin  10 mg Oral Daily   digoxin  0.125 mg Oral Daily   empagliflozin  10 mg Oral Daily   enoxaparin (LOVENOX) injection  40 mg Subcutaneous Q24H   furosemide  20 mg Oral BID   losartan  12.5 mg Oral Daily   mouth rinse  15 mL Mouth Rinse BID   multivitamin with minerals  1 tablet Oral Daily   senna  1 tablet Oral QHS    Infusions:     Patient Profile    Ms Cendejas is  a 61 year old with a history of HTN, GERD, nephrolithiasis, and partial right nephrectomy.    Assessment/Plan   Acute HFrEF Echo EF 20-25% RV normal, Grade II DD.  - Had myoview- per Dr Terrence Dupont negative.  - She has been diuresed with IV  lasix. Overall weight down 16 pounds. ? - SBP 80-90s which may be related to diuresis.  -Entresto stopped earlier today but started losartan and received dose today. Will need to stop to sort out further.  -Stop losartan  -Will need cath to further assess hemodynamcis in addition to CMRI -Renal functions table.   2. Acute Hypoxic Respiratory Failure  Oxygen saturations low on admit. Improved with oxygen  -Saturations stable.   3. LBBB  4. H/O Partial Nephrectomy , nephrolithiasis  Length of Stay: 4  Darrick Grinder, NP  07/17/2021, 2:16 PM  Advanced Heart Failure Team Pager [phone] (M-F; 7a -  5p)  Please contact [facility] for night-coverage after hours (4p -7a ) and weekends on [email or web address]  Patient seen and examined with the above-signed Advanced Practice Provider and/or Housestaff. I personally reviewed laboratory data, imaging studies and relevant notes. I independently examined the patient and formulated the important aspects of the plan. I have edited the note to reflect any of my changes or salient points. I have personally discussed the plan with the patient and/or family.  61 y/o woman as above admitted wit acute onset systolic HF.  Echo with dilated LV EF ~20-25% with prominent septal-lateral dyssynchrony in setting of LBBB (173ms). RV normal.   Myoview EF 20% with ? Large anterior infarct vs breast attenuation   Has diuresed well but now BP soft and unable to tolerate much GDMT.   General:  Obese woman lying in bed . No resp difficulty HEENT: normal Neck: supple. JVP flat Carotids 2+ bilat; no bruits. No lymphadenopathy or thryomegaly appreciated. Cor: PMI laterally displaced. Regular rate & rhythm. No rubs, gallops or  murmurs. Lungs: clear Abdomen: obese soft, nontender, nondistended. No hepatosplenomegaly. No bruits or masses. Good bowel sounds. Extremities: no cyanosis, clubbing, rash, edema Neuro: alert & orientedx3, cranial nerves grossly intact. moves all 4 extremities w/o difficulty. Affect pleasant  She has acute systolic HF and is very tenuous hemodynamically Will need R/L cath to further evaluate. If no CAD will need cMRI. Suspect LBBB may be playing a role but only 178ms.   Continue digoxin, and SGLT2i as tolerated. Hold losartan for now.  No b-blocker yet.   Glori Bickers, MD  8:51 PM

## 2021-07-18 ENCOUNTER — Encounter (HOSPITAL_COMMUNITY)
Admission: EM | Disposition: A | Discharge status: Home Health | Payer: Self-pay | Source: Home / Self Care | Attending: Internal Medicine

## 2021-07-18 ENCOUNTER — Other Ambulatory Visit (HOSPITAL_COMMUNITY): Payer: Self-pay

## 2021-07-18 DIAGNOSIS — I5041 Acute combined systolic (congestive) and diastolic (congestive) heart failure: Secondary | ICD-10-CM | POA: Diagnosis present

## 2021-07-18 DIAGNOSIS — J9601 Acute respiratory failure with hypoxia: Secondary | ICD-10-CM

## 2021-07-18 DIAGNOSIS — I5021 Acute systolic (congestive) heart failure: Secondary | ICD-10-CM

## 2021-07-18 DIAGNOSIS — Z6829 Body mass index (BMI) 29.0-29.9, adult: Secondary | ICD-10-CM

## 2021-07-18 DIAGNOSIS — I447 Left bundle-branch block, unspecified: Secondary | ICD-10-CM

## 2021-07-18 HISTORY — PX: RIGHT/LEFT HEART CATH AND CORONARY ANGIOGRAPHY: CATH118266

## 2021-07-18 LAB — POCT I-STAT EG7
Acid-Base Excess: 3 mmol/L — ABNORMAL HIGH (ref 0.0–2.0)
Acid-Base Excess: 0 mmol/L (ref 0.0–2.0)
Bicarbonate: 28.3 mmol/L — ABNORMAL HIGH (ref 20.0–28.0)
Bicarbonate: 25 mmol/L (ref 20.0–28.0)
Calcium, Ion: 1.15 mmol/L (ref 1.15–1.40)
Calcium, Ion: 0.92 mmol/L — ABNORMAL LOW (ref 1.15–1.40)
HCT: 31 % — ABNORMAL LOW (ref 36.0–46.0)
HCT: 28 % — ABNORMAL LOW (ref 36.0–46.0)
Hemoglobin: 10.5 g/dL — ABNORMAL LOW (ref 12.0–15.0)
Hemoglobin: 9.5 g/dL — ABNORMAL LOW (ref 12.0–15.0)
O2 Saturation: 65 %
O2 Saturation: 70 %
Potassium: 3.9 mmol/L (ref 3.5–5.1)
Potassium: 3.3 mmol/L — ABNORMAL LOW (ref 3.5–5.1)
Sodium: 142 mmol/L (ref 135–145)
Sodium: 145 mmol/L (ref 135–145)
TCO2: 30 mmol/L (ref 22–32)
TCO2: 26 mmol/L (ref 22–32)
pCO2, Ven: 46.5 mmHg (ref 44.0–60.0)
pCO2, Ven: 40.7 mmHg — ABNORMAL LOW (ref 44.0–60.0)
pH, Ven: 7.392 (ref 7.250–7.430)
pH, Ven: 7.397 (ref 7.250–7.430)
pO2, Ven: 34 mmHg (ref 32.0–45.0)
pO2, Ven: 37 mmHg (ref 32.0–45.0)

## 2021-07-18 LAB — POCT I-STAT 7, (LYTES, BLD GAS, ICA,H+H)
Acid-Base Excess: 2 mmol/L (ref 0.0–2.0)
Bicarbonate: 26.8 mmol/L (ref 20.0–28.0)
Calcium, Ion: 1.09 mmol/L — ABNORMAL LOW (ref 1.15–1.40)
HCT: 31 % — ABNORMAL LOW (ref 36.0–46.0)
Hemoglobin: 10.5 g/dL — ABNORMAL LOW (ref 12.0–15.0)
O2 Saturation: 99 %
Potassium: 3.9 mmol/L (ref 3.5–5.1)
Sodium: 142 mmol/L (ref 135–145)
TCO2: 28 mmol/L (ref 22–32)
pCO2 arterial: 44.3 mmHg (ref 32.0–48.0)
pH, Arterial: 7.391 (ref 7.350–7.450)
pO2, Arterial: 118 mmHg — ABNORMAL HIGH (ref 83.0–108.0)

## 2021-07-18 LAB — CBC WITH DIFFERENTIAL/PLATELET
Abs Immature Granulocytes: 0.01 10*3/uL (ref 0.00–0.07)
Basophils Absolute: 0 10*3/uL (ref 0.0–0.1)
Basophils Relative: 1 %
Eosinophils Absolute: 0.2 10*3/uL (ref 0.0–0.5)
Eosinophils Relative: 4 %
HCT: 34.4 % — ABNORMAL LOW (ref 36.0–46.0)
Hemoglobin: 10.7 g/dL — ABNORMAL LOW (ref 12.0–15.0)
Immature Granulocytes: 0 %
Lymphocytes Relative: 32 %
Lymphs Abs: 1.3 10*3/uL (ref 0.7–4.0)
MCH: 27.5 pg (ref 26.0–34.0)
MCHC: 31.1 g/dL (ref 30.0–36.0)
MCV: 88.4 fL (ref 80.0–100.0)
Monocytes Absolute: 0.5 10*3/uL (ref 0.1–1.0)
Monocytes Relative: 13 %
Neutro Abs: 2 10*3/uL (ref 1.7–7.7)
Neutrophils Relative %: 50 %
Platelets: 274 10*3/uL (ref 150–400)
RBC: 3.89 MIL/uL (ref 3.87–5.11)
RDW: 14.7 % (ref 11.5–15.5)
WBC: 4 10*3/uL (ref 4.0–10.5)
nRBC: 0 % (ref 0.0–0.2)

## 2021-07-18 LAB — BASIC METABOLIC PANEL
Anion gap: 10 (ref 5–15)
BUN: 16 mg/dL (ref 6–20)
CO2: 26 mmol/L (ref 22–32)
Calcium: 8.6 mg/dL — ABNORMAL LOW (ref 8.9–10.3)
Chloride: 105 mmol/L (ref 98–111)
Creatinine, Ser: 1 mg/dL (ref 0.44–1.00)
GFR, Estimated: >60 mL/min (ref >60)
Glucose, Bld: 98 mg/dL (ref 70–99)
Potassium: 4.1 mmol/L (ref 3.5–5.1)
Sodium: 141 mmol/L (ref 135–145)

## 2021-07-18 SURGERY — RIGHT/LEFT HEART CATH AND CORONARY ANGIOGRAPHY
Anesthesia: LOCAL

## 2021-07-18 MED ORDER — SODIUM CHLORIDE 0.9 % IV SOLN: INTRAVENOUS | Status: DC

## 2021-07-18 MED ORDER — ONDANSETRON HCL 4 MG/2ML IJ SOLN: 4.0000 mg | Freq: Four times a day (QID) | INTRAMUSCULAR | Status: DC | PRN

## 2021-07-18 MED ORDER — SODIUM CHLORIDE 0.9% FLUSH
3.0000 mL | Freq: Two times a day (BID) | INTRAVENOUS | Status: DC
  Administered 2021-07-19 – 2021-07-20 (×2): 3 mL via INTRAVENOUS

## 2021-07-18 MED ORDER — ASPIRIN EC 81 MG PO TBEC
81.0000 mg | DELAYED_RELEASE_TABLET | Freq: Every day | ORAL | Status: DC
  Administered 2021-07-20: 81 mg via ORAL
  Filled 2021-07-18: qty 1

## 2021-07-18 MED ORDER — FENTANYL CITRATE (PF) 100 MCG/2ML IJ SOLN
INTRAMUSCULAR | Status: DC | PRN
  Administered 2021-07-18: 25 ug via INTRAVENOUS

## 2021-07-18 MED ORDER — SODIUM CHLORIDE 0.9% FLUSH: 3.0000 mL | Freq: Two times a day (BID) | INTRAVENOUS | Status: DC

## 2021-07-18 MED ORDER — HEPARIN (PORCINE) IN NACL 1000-0.9 UT/500ML-% IV SOLN
INTRAVENOUS | Status: DC | PRN
  Administered 2021-07-18 (×2): 500 mL

## 2021-07-18 MED ORDER — VERAPAMIL HCL 2.5 MG/ML IV SOLN
INTRAVENOUS | Status: DC | PRN
  Administered 2021-07-18: 10 mL via INTRA_ARTERIAL

## 2021-07-18 MED ORDER — HYDRALAZINE HCL 20 MG/ML IJ SOLN: 10.0000 mg | INTRAMUSCULAR | Status: AC | PRN

## 2021-07-18 MED ORDER — SODIUM CHLORIDE 0.9 % IV SOLN: 250.0000 mL | INTRAVENOUS | Status: DC | PRN

## 2021-07-18 MED ORDER — HEPARIN SODIUM (PORCINE) 1000 UNIT/ML IJ SOLN
INTRAMUSCULAR | Status: DC | PRN
  Administered 2021-07-18: 4500 [IU] via INTRAVENOUS

## 2021-07-18 MED ORDER — FUROSEMIDE 40 MG PO TABS
40.0000 mg | ORAL_TABLET | Freq: Every day | ORAL | Status: DC
  Administered 2021-07-19 – 2021-07-20 (×2): 40 mg via ORAL
  Filled 2021-07-18 (×2): qty 1

## 2021-07-18 MED ORDER — MIDAZOLAM HCL 2 MG/2ML IJ SOLN
INTRAMUSCULAR | Status: DC | PRN
  Administered 2021-07-18: 1 mg via INTRAVENOUS

## 2021-07-18 MED ORDER — SODIUM CHLORIDE 0.9% FLUSH: 3.0000 mL | INTRAVENOUS | Status: DC | PRN

## 2021-07-18 MED ORDER — LABETALOL HCL 5 MG/ML IV SOLN: 10.0000 mg | INTRAVENOUS | Status: AC | PRN

## 2021-07-18 MED ORDER — IOHEXOL 350 MG/ML SOLN
INTRAVENOUS | Status: DC | PRN
  Administered 2021-07-18: 50 mL

## 2021-07-18 MED ORDER — ASPIRIN 81 MG PO CHEW: 81.0000 mg | CHEWABLE_TABLET | ORAL | Status: DC

## 2021-07-18 MED ORDER — ENOXAPARIN SODIUM 40 MG/0.4ML IJ SOSY
40.0000 mg | PREFILLED_SYRINGE | INTRAMUSCULAR | Status: DC
  Administered 2021-07-19 – 2021-07-20 (×2): 40 mg via SUBCUTANEOUS
  Filled 2021-07-18 (×2): qty 0.4

## 2021-07-18 MED ORDER — SODIUM CHLORIDE 0.9 % IV SOLN: INTRAVENOUS | Status: AC

## 2021-07-18 MED ORDER — LIDOCAINE HCL (PF) 1 % IJ SOLN
INTRAMUSCULAR | Status: DC | PRN
  Administered 2021-07-18: 5 mL

## 2021-07-18 MED ORDER — ACETAMINOPHEN 325 MG PO TABS: 650.0000 mg | ORAL_TABLET | ORAL | Status: DC | PRN

## 2021-07-18 SURGICAL SUPPLY — 10 items
CATH BALLN WEDGE 5F 110CM (CATHETERS) ×1 IMPLANT
CATH INFINITI 5 FR JL3.5 (CATHETERS) ×1 IMPLANT
CATH INFINITI JR4 5F (CATHETERS) ×1 IMPLANT
DEVICE RAD COMP TR BAND LRG (VASCULAR PRODUCTS) ×1 IMPLANT
GLIDESHEATH SLEND SS 6F .021 (SHEATH) ×1 IMPLANT
GUIDEWIRE INQWIRE 1.5J.035X260 (WIRE) IMPLANT
INQWIRE 1.5J .035X260CM (WIRE) ×2
PACK CARDIAC CATHETERIZATION (CUSTOM PROCEDURE TRAY) ×2 IMPLANT
SHEATH GLIDE SLENDER 4/5FR (SHEATH) ×1 IMPLANT
TRANSDUCER W/STOPCOCK (MISCELLANEOUS) ×2 IMPLANT

## 2021-07-18 NOTE — Progress Notes (Signed)
°  Will plan R/L cath this afternoon around 1230p. Orders written.   Glori Bickers, MD  9:48 AM

## 2021-07-18 NOTE — Progress Notes (Signed)
Upon returning the patient's bed to the lowest position. Patient stated "I am going to put my bed up, it is to low." This nurse educated patient as to the fall risk with the bed elevated. She restated she was going to elevate the bed. This nurse notified her primary nurse, Helane Rima, RN. Fran Lowes, RN VAST

## 2021-07-18 NOTE — Plan of Care (Signed)
  Problem: Nutrition: Goal: Adequate nutrition will be maintained Outcome: Progressing   Problem: Coping: Goal: Level of anxiety will decrease Outcome: Progressing   Problem: Elimination: Goal: Will not experience complications related to bowel motility Outcome: Progressing Goal: Will not experience complications related to urinary retention Outcome: Progressing   

## 2021-07-18 NOTE — TOC Benefit Eligibility Note (Signed)
Patient Teacher, English as a foreign language completed.    The patient is currently admitted and upon discharge could be taking Jardiance 10 mg.  The current 30 day co-pay is, $156.52 due to a $110.00 deductible. After deductible is met the copay will be $48.00.  The patient is insured through [employer] Patient Advocate Specialist [place] Patient Advocate Team Direct Number: [phone]  Fax: [phone]

## 2021-07-18 NOTE — Progress Notes (Signed)
Subjective:  Patient denies any chest pain or shortness of breath.  Blood pressure remains soft.  Seen by advanced heart failure team yesterday is scheduled for left and right heart catheterization today  Objective:  Vital Signs in the last 24 hours: Temp:  [97.8 F (36.6 C)-98.3 F (36.8 C)] 98.3 F (36.8 C) (01/17 1117) Pulse Rate:  [69-86] 71 (01/17 1117) Resp:  [18] 18 (01/17 1117) BP: (96-127)/(66-84) 96/66 (01/17 1117) SpO2:  [94 %-98 %] 94 % (01/17 0032) Weight:  [93 kg-93.9 kg] 93 kg (01/17 0206)  Intake/Output from previous day: 01/16 0701 - 01/17 0700 In: 720 [P.O.:720] Out: 900 [Urine:900] Intake/Output from this shift: Total I/O In: 236 [P.O.:236] Out: 300 [Urine:300]  Physical Exam: Exam unchanged  Lab Results: Recent Labs    07/18/21 0429  WBC 4.0  HGB 10.7*  PLT 274   Recent Labs    07/17/21 0323 07/18/21 0429  NA 138 141  K 4.1 4.1  CL 104 105  CO2 26 26  GLUCOSE 109* 98  BUN 21* 16  CREATININE 0.97 1.00   No results for input(s): TROPONINI in the last 72 hours.  Invalid input(s): CK, MB Hepatic Function Panel No results for input(s): PROT, ALBUMIN, AST, ALT, ALKPHOS, BILITOT, BILIDIR, IBILI in the last 72 hours. No results for input(s): CHOL in the last 72 hours. No results for input(s): PROTIME in the last 72 hours.  Imaging: No results found.  Cardiac Studies:  Assessment/Plan:  Resolving acute decompensated systolic heart failure Ischemic/nonischemic dilated cardiomyopathy negative nuclear stress test with no evidence of ischemia Hypertensive heart disease with systolic dysfunction  Minimally elevated high-sensitivity troponin I secondary to above Left bundle branch block GERD History of nephrolithiasis in the past status post right partial nephrectomy.  Prediabetic Plan Continue present management Scheduled for left and right heart cath today Restart guideline mandated heart failure treatment once blood pressure tolerates   LOS: 5 days    Charolette Forward 07/18/2021, 11:42 AM

## 2021-07-18 NOTE — Progress Notes (Signed)
PROGRESS NOTE    Lynn Estrada  HQI:[id number] DOB: [birth year] DOA: 07/12/2021 PCP: [name], PA   Brief Narrative:  Patient is a 61 year old African-American overweight female with past medical history significant for but not limited to essential hypertension, gastritis, history of documented nephrolithiasis requiring right partial nephrectomy who presented with progressive dyspnea shortness of breath and productive cough.  She was also noted to have PND and lower extremity edema found to be saturating 84% in the ED and had to be placed on 2 L and subsequently improved.  Further work-up revealed that she had a BNP of 400 and troponins were a Justin elevated but remained flat.  EKG showed a chronic left bundle branch block and chest x-ray showed bilateral pulm infiltrates.  2D echo was done which showed an EF of 20 to 25% with grade 2 diastolic dysfunction and she ended up going under a nuclear med stress test.  Patient was noted to be dizzy upon standing and so she was transferred to [facility] from [facility] for a left and right heart catheterization and evaluation by the advanced heart failure team.  Right and left heart catheterization to be done later this afternoon  Assessment & Plan:   Principal Problem:   Volume overload Active Problems:   Essential hypertension   BMI 29.0-29.9,adult   Normocytic anemia   Acute respiratory failure with hypoxia (HCC)   Hypocalcemia   Elevated troponin   Acute respiratory failure with hypoxia in the setting of new acute combined systolic and diastolic CHF -SpO2: 94 % O2 Flow Rate (L/min): 2 L/min -Has been weaned off of supplemental oxygen now -Echocardiogram done and showed a dilated LV function with a EF of around 20 to 25% with a prominent septal/lateral dyssynchrony in the setting of left lung branch block with a right ventricle being normal -Cardiology is on board and recommending low-dose digoxin and follow-up with cardiology outpatient  setting in 2 to 4 weeks restarted on beta-blocker repeat 2D echo at that time -Nuclear medicine's scan was high risk and had a EF of 20% with a questionably large anterior infarct versus breast attenuation -Patient became dizzy upon standing yesterday -Has been transferred to [facility] for right and left heart cath and if she has no CAD noted on her catheter she will need a cardiac MRI -Currently holding Entresto -BNP Was elevated 247.1 -Cardiology recommending continuing digoxin 0.125 mg p.o. daily, Jardiance 10 mg daily as well as Lasix 20 g p.o. twice daily  Elevated troponin and chronic left bundle branch block -Cardiology following and appreciate further evaluation and she will undergo a right and left heart catheterization -See above -C/w ASA 81 mg po Daily Atorvastatin 10 mg po Daily   Essential Hypertension -Currently holding her antihypertensives and holding Entresto and Norvasc -Did not tolerate beta-blocker -Continue to monitor blood pressures per protocol -Last blood pressure reading was 96/66  Normocytic Anemia -Patient's Hgb/Hct went from 11.0/35.4 -> 10.4/33.7 -> 10.7/34.4 -Check Anemia Panel in the AM  -Continue to Monitor for S/Sx of Bleeding; No overt bleeding noted -Repeat CBC in the AM   Hypocalcemia -Ca2+ was 8.6 -Albumin was not checked -Continue to Monitor and Trend  Hypokalemia -Resolved -K+ was 4.1 -Continue to Monitor and Replete as Necessary  Hyperglycemia -Checked HbA1c and is 6.2 -Blood Sugars ranging from 98-122 on Daily BMP/CMP -Continue to Monitor and if necessary will place on Sensitive Novolog SSI -C/w Empagliflozin 10 mg po Daily   Overweight -Complicates overall prognosis and care -Estimated  body mass index is 27.05 kg/m as calculated from the following:   Height as of this encounter: 6\' 1"  (1.854 m).   Weight as of this encounter: 93 kg.  -Weight Loss and Dietary Counseling given   DVT prophylaxis: Enoxaparin 40 mg sq q24h Code  Status: FULL CODE  Family Communication: No family present at bedside  Disposition Plan: Pending further clinical improvement and clearance by cardiology as she is going for right left heart cath  Status is: Inpatient  Remains inpatient appropriate because: She will undergo a heart cath today   Consultants:  Cardiology Dr. Terrence Dupont Advanced heart failure team Dr. Haroldine Laws  Procedures:  ECHOCARDIOGRAM IMPRESSIONS     1. Left ventricular ejection fraction, by estimation, is 20 to 25%. The  left ventricle has severely decreased function. The left ventricle  demonstrates global hypokinesis with septal-lateral dyssynchrony  consistent with LBBB. The left ventricular  internal cavity size was moderately dilated. Left ventricular diastolic  parameters are consistent with Grade II diastolic dysfunction  (pseudonormalization).   2. Right ventricular systolic function is normal. The right ventricular  size is normal. Tricuspid regurgitation signal is inadequate for assessing  PA pressure.   3. Left atrial size was moderately dilated.   4. The mitral valve is normal in structure. Trivial mitral valve  regurgitation. No evidence of mitral stenosis.   5. The aortic valve is tricuspid. Aortic valve regurgitation is trivial.  No aortic stenosis is present.   6. The inferior vena cava is normal in size with <50% respiratory  variability, suggesting right atrial pressure of 8 mmHg.   FINDINGS   Left Ventricle: Left ventricular ejection fraction, by estimation, is 20  to 25%. The left ventricle has severely decreased function. The left  ventricle demonstrates global hypokinesis. The left ventricular internal  cavity size was moderately dilated.  There is no left ventricular hypertrophy. Left ventricular diastolic  parameters are consistent with Grade II diastolic dysfunction  (pseudonormalization).   Right Ventricle: The right ventricular size is normal. No increase in  right ventricular  wall thickness. Right ventricular systolic function is  normal. Tricuspid regurgitation signal is inadequate for assessing PA  pressure.   Left Atrium: Left atrial size was moderately dilated.   Right Atrium: Right atrial size was normal in size.   Pericardium: Trivial pericardial effusion is present.   Mitral Valve: The mitral valve is normal in structure. Trivial mitral  valve regurgitation. No evidence of mitral valve stenosis.   Tricuspid Valve: The tricuspid valve is normal in structure. Tricuspid  valve regurgitation is not demonstrated.   Aortic Valve: The aortic valve is tricuspid. Aortic valve regurgitation is  trivial. Aortic regurgitation PHT measures 408 msec. No aortic stenosis is  present. Aortic valve mean gradient measures 7.0 mmHg. Aortic valve peak  gradient measures 11.4 mmHg.  Aortic valve area, by VTI measures 3.02 cm.   Pulmonic Valve: The pulmonic valve was normal in structure. Pulmonic valve  regurgitation is not visualized.   Aorta: The aortic root is normal in size and structure.   Venous: The inferior vena cava is normal in size with less than 50%  respiratory variability, suggesting right atrial pressure of 8 mmHg.   IAS/Shunts: No atrial level shunt detected by color flow Doppler.      LEFT VENTRICLE  PLAX 2D  LVIDd:         6.70 cm      Diastology  LVIDs:         5.80 cm  LV e' medial:  5.11 cm/s  LV PW:         1.10 cm      LV e' lateral: 11.60 cm/s  LV IVS:        0.80 cm  LVOT diam:     2.10 cm  LV SV:         95  LV SV Index:   43  LVOT Area:     3.46 cm     LV Volumes (MOD)  LV vol d, MOD A2C: 187.0 ml  LV vol d, MOD A4C: 159.0 ml  LV vol s, MOD A2C: 132.0 ml  LV vol s, MOD A4C: 105.5 ml  LV SV MOD A2C:     55.0 ml  LV SV MOD A4C:     159.0 ml  LV SV MOD BP:      62.9 ml   RIGHT VENTRICLE             IVC  RV S prime:     12.00 cm/s  IVC diam: 1.80 cm  TAPSE (M-mode): 1.8 cm   LEFT ATRIUM              Index        RIGHT  ATRIUM           Index  LA diam:        4.40 cm  2.01 cm/m   RA Area:     16.10 cm  LA Vol (A2C):   84.3 ml  38.54 ml/m  RA Volume:   46.60 ml  21.30 ml/m  LA Vol (A4C):   112.0 ml 51.20 ml/m  LA Biplane Vol: 100.0 ml 45.72 ml/m   AORTIC VALVE                     PULMONIC VALVE  AV Area (Vmax):    3.28 cm      PV Vmax:       0.67 m/s  AV Area (Vmean):   2.60 cm      PV Peak grad:  1.8 mmHg  AV Area (VTI):     3.02 cm  AV Vmax:           169.00 cm/s  AV Vmean:          126.000 cm/s  AV VTI:            0.313 m  AV Peak Grad:      11.4 mmHg  AV Mean Grad:      7.0 mmHg  LVOT Vmax:         160.00 cm/s  LVOT Vmean:        94.700 cm/s  LVOT VTI:          0.273 m  LVOT/AV VTI ratio: 0.87  AI PHT:            408 msec     AORTA  Ao Root diam: 2.90 cm  Ao Asc diam:  2.80 cm      SHUNTS  Systemic VTI:  0.27 m  Systemic Diam: 2.10 cm   NM MYOVIEW IMPRESSION: 1. No evidence of reversible myocardial ischemia. 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   2. Severe left ventricular dilatation and global hypokinesis.   3. Left ventricular ejection fraction 20%   4. Non invasive risk stratification*: High  Antimicrobials:  Anti-infectives (From admission, onward)    None  Subjective: Seen and examined at bedside and is awaiting a cardiac catheterization later this afternoon.  No nausea or vomiting.  Felt okay.  Shortness of breath is improved.  Not wearing supplemental oxygen via nasal cannula.  No other concerns or comments at this time.  Objective: Vitals:   07/18/21 0032 07/18/21 0206 07/18/21 0811 07/18/21 1117  BP: 127/77  107/73 96/66  Pulse: 86  74 71  Resp:   18 18  Temp:   98.1 F (36.7 C) 98.3 F (36.8 C)  TempSrc:   Oral Oral  SpO2: 94%     Weight:  93 kg    Height:        Intake/Output Summary (Last 24 hours) at 07/18/2021 1530 Last data filed at 07/18/2021 1504 Gross per 24  hour  Intake 492.52 ml  Output 900 ml  Net -407.48 ml   Filed Weights   07/17/21 0433 07/17/21 1759 07/18/21 0206  Weight: 92.7 kg 93.9 kg 93 kg   Examination: Physical Exam:  Constitutional: WN/WD overweight AAF in NAD and appears calm and comfortable Eyes: Lids and conjunctivae normal, sclerae anicteric  ENMT: External Ears, Nose appear normal. Grossly normal hearing. Mucous membranes are moist. Neck: Appears normal, supple, no cervical masses, normal ROM, no appreciable thyromegaly; no JVD Respiratory: Diminished to auscultation bilaterally, no wheezing, rales, rhonchi or crackles. Normal respiratory effort and patient is not tachypenic. No accessory muscle use. Unlabored breathing  Cardiovascular: RRR, no murmurs / rubs / gallops. S1 and S2 auscultated. Trace extremity edema. 2+ pedal pulses. No carotid bruits.  Abdomen: Soft, non-tender, Distended 2/2 body habitus. Bowel sounds positive.  GU: Deferred. Musculoskeletal: No clubbing / cyanosis of digits/nails. No joint deformity upper and lower extremities.  Skin: No rashes, lesions, ulcers on a limited skin evaluation. No induration; Warm and dry.  Neurologic: CN 2-12 grossly intact with no focal deficits. Romberg sign and cerebellar reflexes not assessed.  Psychiatric: Normal judgment and insight. Alert and oriented x 3. Normal mood and appropriate affect.   Data Reviewed: I have personally reviewed following labs and imaging studies  CBC: Recent Labs  Lab 07/12/21 1045 07/14/21 0335 07/15/21 0420 07/18/21 0429  WBC 7.8 6.3  --  4.0  NEUTROABS  --   --   --  2.0  HGB 11.4* 11.0* 10.4* 10.7*  HCT 36.4 35.4* 33.7* 34.4*  MCV 88.3 88.5  --  88.4  PLT 298 280  --  694   Basic Metabolic Panel: Recent Labs  Lab 07/12/21 1216 07/13/21 0329 07/14/21 0335 07/15/21 0420 07/16/21 0916 07/17/21 0323 07/18/21 0429  NA  --    < > 137 140 139 138 141  K  --    < > 3.3* 4.0 4.0 4.1 4.1  CL  --    < > 103 106 102 104 105   CO2  --    < > 24 29 29 26 26   GLUCOSE  --    < > 106* 122* 120* 109* 98  BUN  --    < > 21* 22* 19 21* 16  CREATININE  --    < > 0.77 0.87 1.00 0.97 1.00  CALCIUM  --    < > 8.5* 8.6* 9.0 8.7* 8.6*  MG 1.9  --  2.1 2.2  --   --   --   PHOS  --   --  3.8 3.7  --   --   --    < > = values in this interval  not displayed.   GFR: Estimated Creatinine Clearance: 78.8 mL/min (by C-G formula based on SCr of 1 mg/dL). Liver Function Tests: Recent Labs  Lab 07/12/21 1216 07/14/21 0335 07/15/21 0420  AST 24  --   --   ALT 39  --   --   ALKPHOS 49  --   --   BILITOT 1.2  --   --   PROT 6.5  --   --   ALBUMIN 3.5 3.5 3.3*   No results for input(s): LIPASE, AMYLASE in the last 168 hours. No results for input(s): AMMONIA in the last 168 hours. Coagulation Profile: No results for input(s): INR, PROTIME in the last 168 hours. Cardiac Enzymes: No results for input(s): CKTOTAL, CKMB, CKMBINDEX, TROPONINI in the last 168 hours. BNP (last 3 results) No results for input(s): PROBNP in the last 8760 hours. HbA1C: No results for input(s): HGBA1C in the last 72 hours. CBG: No results for input(s): GLUCAP in the last 168 hours. Lipid Profile: No results for input(s): CHOL, HDL, LDLCALC, TRIG, CHOLHDL, LDLDIRECT in the last 72 hours. Thyroid Function Tests: No results for input(s): TSH, T4TOTAL, FREET4, T3FREE, THYROIDAB in the last 72 hours. Anemia Panel: No results for input(s): VITAMINB12, FOLATE, FERRITIN, TIBC, IRON, RETICCTPCT in the last 72 hours. Sepsis Labs: Recent Labs  Lab 07/13/21 1647 07/14/21 0335 07/15/21 0420  PROCALCITON <0.10 <0.10 <0.10    Recent Results (from the past 240 hour(s))  Resp Panel by RT-PCR (Flu A&B, Covid) Nasopharyngeal Swab     Status: None   Collection Time: 07/12/21 10:48 AM   Specimen: Nasopharyngeal Swab; Nasopharyngeal(NP) swabs in vial transport medium  Result Value Ref Range Status   SARS Coronavirus 2 by RT PCR NEGATIVE NEGATIVE Final     Comment: (NOTE) SARS-CoV-2 target nucleic acids are NOT DETECTED.  The SARS-CoV-2 RNA is generally detectable in upper respiratory specimens during the acute phase of infection. The lowest concentration of SARS-CoV-2 viral copies this assay can detect is 138 copies/mL. A negative result does not preclude SARS-Cov-2 infection and should not be used as the sole basis for treatment or other patient management decisions. A negative result may occur with  improper specimen collection/handling, submission of specimen other than nasopharyngeal swab, presence of viral mutation(s) within the areas targeted by this assay, and inadequate number of viral copies(<138 copies/mL). A negative result must be combined with clinical observations, patient history, and epidemiological information. The expected result is Negative.  Fact Sheet for Patients:  EntrepreneurPulse.com.au  Fact Sheet for Healthcare Providers:  IncredibleEmployment.be  This test is no t yet approved or cleared by the [place] FDA and  has been authorized for detection and/or diagnosis of SARS-CoV-2 by FDA under an Emergency Use Authorization (EUA). This EUA will remain  in effect (meaning this test can be used) for the duration of the COVID-19 declaration under Section 564(b)(1) of the Act, 21 U.S.C.section 360bbb-3(b)(1), unless the authorization is terminated  or revoked sooner.       Influenza A by PCR NEGATIVE NEGATIVE Final   Influenza B by PCR NEGATIVE NEGATIVE Final    Comment: (NOTE) The Xpert Xpress SARS-CoV-2/FLU/RSV plus assay is intended as an aid in the diagnosis of influenza from Nasopharyngeal swab specimens and should not be used as a sole basis for treatment. Nasal washings and aspirates are unacceptable for Xpert Xpress SARS-CoV-2/FLU/RSV testing.  Fact Sheet for Patients: EntrepreneurPulse.com.au  Fact Sheet for Healthcare  Providers: IncredibleEmployment.be  This test is not yet approved or cleared by  the [employer] and has been authorized for detection and/or diagnosis of SARS-CoV-2 by FDA under an Emergency Use Authorization (EUA). This EUA will remain in effect (meaning this test can be used) for the duration of the COVID-19 declaration under Section 564(b)(1) of the Act, 21 U.S.C. section 360bbb-3(b)(1), unless the authorization is terminated or revoked.  Performed at [facility], [place] [address] [place], [place] [address]   Respiratory (~20 pathogens) panel by PCR     Status: None   Collection Time: 07/13/21 10:09 PM   Specimen: Nasopharyngeal Swab; Respiratory  Result Value Ref Range Status   Adenovirus NOT DETECTED NOT DETECTED Final   Coronavirus 229E NOT DETECTED NOT DETECTED Final    Comment: (NOTE) The Coronavirus on the Respiratory Panel, DOES NOT test for the novel  Coronavirus (2019 nCoV)    Coronavirus HKU1 NOT DETECTED NOT DETECTED Final   Coronavirus NL63 NOT DETECTED NOT DETECTED Final   Coronavirus OC43 NOT DETECTED NOT DETECTED Final   Metapneumovirus NOT DETECTED NOT DETECTED Final   Rhinovirus / Enterovirus NOT DETECTED NOT DETECTED Final   Influenza A NOT DETECTED NOT DETECTED Final   Influenza B NOT DETECTED NOT DETECTED Final   Parainfluenza Virus 1 NOT DETECTED NOT DETECTED Final   Parainfluenza Virus 2 NOT DETECTED NOT DETECTED Final   Parainfluenza Virus 3 NOT DETECTED NOT DETECTED Final   Parainfluenza Virus 4 NOT DETECTED NOT DETECTED Final   Respiratory Syncytial Virus NOT DETECTED NOT DETECTED Final   Bordetella pertussis NOT DETECTED NOT DETECTED Final   Bordetella Parapertussis NOT DETECTED NOT DETECTED Final   Chlamydophila pneumoniae NOT DETECTED NOT DETECTED Final   Mycoplasma pneumoniae NOT DETECTED NOT DETECTED Final    Comment: Performed at [facility], [place]. [address], [place], [place] [address]    RN  Pressure Injury Documentation:     Estimated body mass index is 27.05 kg/m as calculated from the following:   Height as of this encounter: 6\' 1"  (1.854 m).   Weight as of this encounter: 93 kg.  Malnutrition Type:  Nutrition Problem: Increased nutrient needs Etiology: acute illness  Malnutrition Characteristics:  Signs/Symptoms: estimated needs  Nutrition Interventions:  Interventions: Magic cup, MVI   Radiology Studies: No results found.  Scheduled Meds:  [START ON 07/19/2021] aspirin  81 mg Oral Pre-Cath   [START ON 07/20/2021] aspirin EC  81 mg Oral Daily   atorvastatin  10 mg Oral Daily   digoxin  0.125 mg Oral Daily   empagliflozin  10 mg Oral Daily   enoxaparin (LOVENOX) injection  40 mg Subcutaneous Q24H   furosemide  20 mg Oral BID   mouth rinse  15 mL Mouth Rinse BID   multivitamin with minerals  1 tablet Oral Daily   senna  1 tablet Oral QHS   sodium chloride flush  3 mL Intravenous Q12H   Continuous Infusions:  sodium chloride     sodium chloride 10 mL/hr at 07/18/21 1324    LOS: 5 days   Kerney Elbe, DO [facility] PAGER is on AMION  If 7PM-7AM, please contact night-coverage [email or web address]

## 2021-07-18 NOTE — Interval H&P Note (Signed)
History and Physical Interval Note:  07/18/2021 12:53 PM  [name]  has presented today for surgery, with the diagnosis of heart failure.  The various methods of treatment have been discussed with the patient and family. After consideration of risks, benefits and other options for treatment, the patient has consented to  Procedure(s): RIGHT/LEFT HEART CATH AND CORONARY ANGIOGRAPHY (N/A) and possible coronary angioplasty as a surgical intervention.  The patient's history has been reviewed, patient examined, no change in status, stable for surgery.  I have reviewed the patient's chart and labs.  Questions were answered to the patient's satisfaction.     Lynn Estrada

## 2021-07-18 NOTE — Progress Notes (Signed)
Heart Failure Navigator Progress Note  Assessed for Heart & Vascular TOC clinic readiness.  Patient does not meet criteria due to AHF rounding team consulted this admission.   Navigator available for educational resources.   Pricilla Holm, MSN, RN Heart Failure Nurse Navigator [phone]

## 2021-07-18 NOTE — Progress Notes (Addendum)
Advanced Heart Failure Rounding Note  PCP-Cardiologist: None   Subjective:    Wt continues to trend down, down 2 additional lb.   SCr stable, 1.00. K 4.1   BP remains soft, 96/66. No orthostatic symptoms.   Breathing much improved. Denies CP. Awaiting R/LHC today.    Objective:   Weight Range: 93 kg Body mass index is 27.05 kg/m.   Vital Signs:   Temp:  [97.8 F (36.6 C)-98.3 F (36.8 C)] 98.3 F (36.8 C) (01/17 1117) Pulse Rate:  [69-86] 71 (01/17 1117) Resp:  [18] 18 (01/17 1117) BP: (96-127)/(66-84) 96/66 (01/17 1117) SpO2:  [94 %-98 %] 94 % (01/17 0032) Weight:  [93 kg-93.9 kg] 93 kg (01/17 0206) Last BM Date: 07/17/21  Weight change: Filed Weights   07/17/21 0433 07/17/21 1759 07/18/21 0206  Weight: 92.7 kg 93.9 kg 93 kg    Intake/Output:   Intake/Output Summary (Last 24 hours) at 07/18/2021 1127 Last data filed at 07/18/2021 1002 Gross per 24 hour  Intake 836 ml  Output 1200 ml  Net -364 ml      Physical Exam    General:  Well appearing. No resp difficulty HEENT: Normal Neck: Supple. JVP not elevated . Carotids 2+ bilat; no bruits. No lymphadenopathy or thyromegaly appreciated. Cor: PMI nondisplaced. Regular rate & rhythm. No rubs, gallops or murmurs. Lungs: Clear Abdomen: Soft, nontender, nondistended. No hepatosplenomegaly. No bruits or masses. Good bowel sounds. Extremities: No cyanosis, clubbing, rash, edema Neuro: Alert & orientedx3, cranial nerves grossly intact. moves all 4 extremities w/o difficulty. Affect pleasant   Telemetry   NSR 74 bpm   EKG    No new EKG to review   Labs    CBC Recent Labs    07/18/21 0429  WBC 4.0  NEUTROABS 2.0  HGB 10.7*  HCT 34.4*  MCV 88.4  PLT 656   Basic Metabolic Panel Recent Labs    07/17/21 0323 07/18/21 0429  NA 138 141  K 4.1 4.1  CL 104 105  CO2 26 26  GLUCOSE 109* 98  BUN 21* 16  CREATININE 0.97 1.00  CALCIUM 8.7* 8.6*   Liver Function Tests No results for input(s):  AST, ALT, ALKPHOS, BILITOT, PROT, ALBUMIN in the last 72 hours. No results for input(s): LIPASE, AMYLASE in the last 72 hours. Cardiac Enzymes No results for input(s): CKTOTAL, CKMB, CKMBINDEX, TROPONINI in the last 72 hours.  BNP: BNP (last 3 results) Recent Labs    07/12/21 1047 07/14/21 0335  BNP 394.3* 247.1*    ProBNP (last 3 results) No results for input(s): PROBNP in the last 8760 hours.   D-Dimer No results for input(s): DDIMER in the last 72 hours. Hemoglobin A1C No results for input(s): HGBA1C in the last 72 hours. Fasting Lipid Panel No results for input(s): CHOL, HDL, LDLCALC, TRIG, CHOLHDL, LDLDIRECT in the last 72 hours. Thyroid Function Tests No results for input(s): TSH, T4TOTAL, T3FREE, THYROIDAB in the last 72 hours.  Invalid input(s): FREET3  Other results:   Imaging    No results found.   Medications:     Scheduled Medications:  [START ON 07/19/2021] aspirin  81 mg Oral Pre-Cath   [START ON 07/20/2021] aspirin EC  81 mg Oral Daily   atorvastatin  10 mg Oral Daily   digoxin  0.125 mg Oral Daily   empagliflozin  10 mg Oral Daily   enoxaparin (LOVENOX) injection  40 mg Subcutaneous Q24H   furosemide  20 mg Oral BID   mouth  rinse  15 mL Mouth Rinse BID   multivitamin with minerals  1 tablet Oral Daily   senna  1 tablet Oral QHS   sodium chloride flush  3 mL Intravenous Q12H    Infusions:  sodium chloride     [START ON 07/19/2021] sodium chloride      PRN Medications: sodium chloride, acetaminophen **OR** acetaminophen, guaiFENesin-dextromethorphan, ondansetron **OR** ondansetron (ZOFRAN) IV, sodium chloride flush    Patient Profile   Ms Lymon is a 61 year old with a history of HTN, GERD, nephrolithiasis, and partial right nephrectomy, admitted w/ acute heart failure and acute hypoxic respiratory failure.  Echo with dilated LV EF ~20-25% with prominent septal-lateral dyssynchrony in setting of LBBB (124ms). RV normal.  Myoview EF 20%  with ? Large anterior infarct vs breast attenuation.  Transferred to [facility] for [facility] and AHF consultation.   Assessment/Plan   Acute Systolic Heart Failure (New)  - Echo with dilated LV EF ~20-25% with prominent septal-lateral dyssynchrony in setting of LBBB (157ms). RV normal.  - Myoview EF 20% with ? Large anterior infarct vs breast attenuation.  - Plan R/LHC today  - If no CAD will need cMRI.  - Suspect LBBB may be playing a role but only 176ms. Not candidate for CRT-D  - Continue digoxin 0.125 mg daily  - Continue Jardiance 10 mg daily  - Continue Lasix 20 mg bid   - BP to soft for ARNi/Spiro - no ? blocker yet  2. Acute Hypoxic Respiratory Failure  - Oxygen saturations low on admit. Improved with oxygen and diuresis  - Saturations stable on RA    3. LBBB - Suspect LBBB may be playing a role but only 122ms. - Plan LHC today    4. H/O Partial Nephrectomy, nephrolithiasis - SCr stable - follow BMP     Length of Stay: [address], PA-C  07/18/2021, 11:27 AM  Advanced Heart Failure Team Pager [phone] (M-F; 7a - 5p)  Please contact [facility] for night-coverage after hours (5p -7a ) and weekends on [email or web address]  Patient seen and examined with the above-signed Advanced Practice Provider and/or Housestaff. I personally reviewed laboratory data, imaging studies and relevant notes. I independently examined the patient and formulated the important aspects of the plan. I have edited the note to reflect any of my changes or salient points. I have personally discussed the plan with the patient and/or family.  Breathing better today. Weight down 2 pounds. BP remains soft. No orthopnea or PND.   General:  Lying in bed . No resp difficulty HEENT: normal Neck: supple. no JVD. Carotids 2+ bilat; no bruits. No lymphadenopathy or thryomegaly appreciated. Cor: PMI nondisplaced. Regular rate & rhythm. No rubs, gallops or murmurs. Lungs: clear Abdomen: soft, nontender,  nondistended. No hepatosplenomegaly. No bruits or masses. Good bowel sounds. Extremities: no cyanosis, clubbing, rash, edema Neuro: alert & orientedx3, cranial nerves grossly intact. moves all 4 extremities w/o difficulty. Affect pleasant  Suspect severe NICM but Myoview is equivocal. For R/L cath today. If no CAD with need cMRI and genetic testing. GDMT limited by low BP.   Glori Bickers, MD  12:55 PM

## 2021-07-18 NOTE — H&P (View-Only) (Signed)
°  Will plan R/L cath this afternoon around 1230p. Orders written.   Glori Bickers, MD  9:48 AM

## 2021-07-19 ENCOUNTER — Inpatient Hospital Stay (HOSPITAL_COMMUNITY): Payer: BC Managed Care – PPO

## 2021-07-19 ENCOUNTER — Encounter (HOSPITAL_COMMUNITY): Payer: Self-pay | Admitting: Internal Medicine

## 2021-07-19 DIAGNOSIS — I5042 Chronic combined systolic (congestive) and diastolic (congestive) heart failure: Secondary | ICD-10-CM

## 2021-07-19 DIAGNOSIS — E663 Overweight: Secondary | ICD-10-CM

## 2021-07-19 LAB — COMPREHENSIVE METABOLIC PANEL
ALT: 24 U/L (ref 0–44)
AST: 19 U/L (ref 15–41)
Albumin: 3.4 g/dL — ABNORMAL LOW (ref 3.5–5.0)
Alkaline Phosphatase: 44 U/L (ref 38–126)
Anion gap: 10 (ref 5–15)
BUN: 16 mg/dL (ref 6–20)
CO2: 24 mmol/L (ref 22–32)
Calcium: 8.9 mg/dL (ref 8.9–10.3)
Chloride: 105 mmol/L (ref 98–111)
Creatinine, Ser: 0.89 mg/dL (ref 0.44–1.00)
GFR, Estimated: >60 mL/min (ref >60)
Glucose, Bld: 98 mg/dL (ref 70–99)
Potassium: 4.4 mmol/L (ref 3.5–5.1)
Sodium: 139 mmol/L (ref 135–145)
Total Bilirubin: 0.6 mg/dL (ref 0.3–1.2)
Total Protein: 6.7 g/dL (ref 6.5–8.1)

## 2021-07-19 LAB — CBC WITH DIFFERENTIAL/PLATELET
Abs Immature Granulocytes: 0.01 10*3/uL (ref 0.00–0.07)
Basophils Absolute: 0 10*3/uL (ref 0.0–0.1)
Basophils Relative: 1 %
Eosinophils Absolute: 0.1 10*3/uL (ref 0.0–0.5)
Eosinophils Relative: 3 %
HCT: 35.1 % — ABNORMAL LOW (ref 36.0–46.0)
Hemoglobin: 10.9 g/dL — ABNORMAL LOW (ref 12.0–15.0)
Immature Granulocytes: 0 %
Lymphocytes Relative: 35 %
Lymphs Abs: 1.3 10*3/uL (ref 0.7–4.0)
MCH: 27.4 pg (ref 26.0–34.0)
MCHC: 31.1 g/dL (ref 30.0–36.0)
MCV: 88.2 fL (ref 80.0–100.0)
Monocytes Absolute: 0.4 10*3/uL (ref 0.1–1.0)
Monocytes Relative: 10 %
Neutro Abs: 1.9 10*3/uL (ref 1.7–7.7)
Neutrophils Relative %: 51 %
Platelets: 272 10*3/uL (ref 150–400)
RBC: 3.98 MIL/uL (ref 3.87–5.11)
RDW: 14.6 % (ref 11.5–15.5)
WBC: 3.7 10*3/uL — ABNORMAL LOW (ref 4.0–10.5)
nRBC: 0 % (ref 0.0–0.2)

## 2021-07-19 LAB — PHOSPHORUS: Phosphorus: 4 mg/dL (ref 2.5–4.6)

## 2021-07-19 LAB — MAGNESIUM: Magnesium: 2.3 mg/dL (ref 1.7–2.4)

## 2021-07-19 MED ORDER — SPIRONOLACTONE 12.5 MG HALF TABLET
12.5000 mg | ORAL_TABLET | Freq: Every day | ORAL | Status: DC
  Administered 2021-07-19: 12.5 mg via ORAL
  Filled 2021-07-19 (×2): qty 1

## 2021-07-19 MED ORDER — GADOBUTROL 1 MMOL/ML IV SOLN
10.0000 mL | Freq: Once | INTRAVENOUS | Status: AC | PRN
  Administered 2021-07-19: 10 mL via INTRAVENOUS

## 2021-07-19 NOTE — Assessment & Plan Note (Signed)
Oxygen able to be weaned off.  Secondary to CHF

## 2021-07-19 NOTE — Assessment & Plan Note (Addendum)
Cause unclear.  Cardiac catheterization revealed minimal CAD.  Possible due to left bundle branch block.  Appreciate cardiology help.  Stable for discharge today.  Follow-up in acute heart failure clinic.  Continue digoxin, statin, diuretics and Jardiance.

## 2021-07-19 NOTE — Progress Notes (Signed)
Advanced Heart Failure Rounding Note  PCP-Cardiologist: None   Subjective:    Feels well.   R/L cath yesterday. Minimal CAD. EF 20%  Ao = 106/69 (86) LV = 107/29 RA =  6 RV = 42/7 PA = 41/22 (31) PCW = 21 Fick cardiac output/index = 6.8/3.1 PVR = 1.5 WU SVR = 941 Ao sat = 99% PA sat = 65%, 70%   Remains on po lasix. Denies CP or SOB. BP soft in 90s    Objective:   Weight Range: 92.8 kg Body mass index is 26.99 kg/m.   Vital Signs:   Temp:  [97.9 F (36.6 C)-98.6 F (37 C)] 98.5 F (36.9 C) (01/18 1138) Pulse Rate:  [63-81] 66 (01/18 1138) Resp:  [13-30] 18 (01/18 1138) BP: (95-153)/(61-90) 95/65 (01/18 1138) SpO2:  [96 %-100 %] 96 % (01/18 0423) Weight:  [92.8 kg] 92.8 kg (01/18 0423) Last BM Date: 07/17/21  Weight change: Filed Weights   07/17/21 1759 07/18/21 0206 07/19/21 0423  Weight: 93.9 kg 93 kg 92.8 kg    Intake/Output:   Intake/Output Summary (Last 24 hours) at 07/19/2021 1217 Last data filed at 07/19/2021 1140 Gross per 24 hour  Intake 772.76 ml  Output 400 ml  Net 372.76 ml       Physical Exam    General:  Well appearing. No resp difficulty HEENT: normal Neck: supple. no JVD. Carotids 2+ bilat; no bruits. No lymphadenopathy or thryomegaly appreciated. Cor: PMI nondisplaced. Regular rate & rhythm. No rubs, gallops or murmurs. Lungs: clear Abdomen: soft, nontender, nondistended. No hepatosplenomegaly. No bruits or masses. Good bowel sounds. Extremities: no cyanosis, clubbing, rash, edema Neuro: alert & orientedx3, cranial nerves grossly intact. moves all 4 extremities w/o difficulty. Affect pleasant   Telemetry   NSR 60-70s Personally reviewed   Labs    CBC Recent Labs    07/18/21 0429 07/18/21 1643 07/18/21 1647 07/19/21 0648  WBC 4.0  --   --  3.7*  NEUTROABS 2.0  --   --  1.9  HGB 10.7*   < > 10.5* 10.9*  HCT 34.4*   < > 31.0* 35.1*  MCV 88.4  --   --  88.2  PLT 274  --   --  272   < > = values in this  interval not displayed.    Basic Metabolic Panel Recent Labs    07/18/21 0429 07/18/21 1643 07/18/21 1647 07/19/21 0648  NA 141   < > 142 139  K 4.1   < > 3.9 4.4  CL 105  --   --  105  CO2 26  --   --  24  GLUCOSE 98  --   --  98  BUN 16  --   --  16  CREATININE 1.00  --   --  0.89  CALCIUM 8.6*  --   --  8.9  MG  --   --   --  2.3  PHOS  --   --   --  4.0   < > = values in this interval not displayed.    Liver Function Tests Recent Labs    07/19/21 0648  AST 19  ALT 24  ALKPHOS 44  BILITOT 0.6  PROT 6.7  ALBUMIN 3.4*   No results for input(s): LIPASE, AMYLASE in the last 72 hours. Cardiac Enzymes No results for input(s): CKTOTAL, CKMB, CKMBINDEX, TROPONINI in the last 72 hours.  BNP: BNP (last 3 results) Recent Labs  07/12/21 1047 07/14/21 0335  BNP 394.3* 247.1*     ProBNP (last 3 results) No results for input(s): PROBNP in the last 8760 hours.   D-Dimer No results for input(s): DDIMER in the last 72 hours. Hemoglobin A1C No results for input(s): HGBA1C in the last 72 hours. Fasting Lipid Panel No results for input(s): CHOL, HDL, LDLCALC, TRIG, CHOLHDL, LDLDIRECT in the last 72 hours. Thyroid Function Tests No results for input(s): TSH, T4TOTAL, T3FREE, THYROIDAB in the last 72 hours.  Invalid input(s): FREET3  Other results:   Imaging    CARDIAC CATHETERIZATION  Result Date: 07/18/2021   Dist LAD lesion is 20% stenosed.   Mid Cx lesion is 20% stenosed.   The left ventricular ejection fraction is less than 25% by visual estimate. Findings: Ao = 106/69 (86) LV = 107/29 RA =  6 RV = 42/7 PA = 41/22 (31) PCW = 21 Fick cardiac output/index = 6.8/3.1 PVR = 1.5 WU SVR = 941 Ao sat = 99% PA sat = 65%, 70% Assessment: 1. Minimal non-obstructive CAD 2. 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 Mildly elevated filling pressures with normal output Plan/Discussion: Continue to diurese. Titrate GDMT as BP tolerates. cMRI tomorrow. Glori Bickers, MD 5:07 PM     Medications:     Scheduled Medications:  [START ON 07/20/2021] aspirin EC  81 mg Oral Daily   atorvastatin  10 mg Oral Daily   digoxin  0.125 mg Oral Daily   empagliflozin  10 mg Oral Daily   enoxaparin (LOVENOX) injection  40 mg Subcutaneous Q24H   furosemide  40 mg Oral Daily   mouth rinse  15 mL Mouth Rinse BID   multivitamin with minerals  1 tablet Oral Daily   senna  1 tablet Oral QHS   sodium chloride flush  3 mL Intravenous Q12H    Infusions:  sodium chloride      PRN Medications: sodium chloride, acetaminophen, guaiFENesin-dextromethorphan, ondansetron (ZOFRAN) IV, sodium chloride flush    Patient Profile   Ms Spellman is a 61 year old with a history of HTN, GERD, nephrolithiasis, and partial right nephrectomy, admitted w/ acute heart failure and acute hypoxic respiratory failure.  Echo with dilated LV EF ~20-25% with prominent septal-lateral dyssynchrony in setting of LBBB (166ms). RV normal.  Myoview EF 20% with ? Large anterior infarct vs breast attenuation.  Transferred to [facility] for [facility] and AHF consultation.   Assessment/Plan   Acute Systolic Heart Failure (New)  - Echo with dilated LV EF ~20-25% with prominent septal-lateral dyssynchrony in setting of LBBB (155ms). RV normal.  - Myoview EF 20% with ? Large anterior infarct vs breast attenuation.  - Cath 07/18/21 with minimal CAD. Mildly elevated filling pressures and normal CO - Suspect LBBB may be playing a role but only 137ms. Not candidate for CRT-D  - Continue digoxin 0.125 mg daily  - Continue Jardiance 10 mg daily  - Continue lasix - cMRI ordered for today  - BP to soft for ARNi - Add low-dose spiro - no ? blocker yet  2. Acute Hypoxic Respiratory Failure  - Oxygen saturations low on admit. Improved with oxygen and diuresis  - Saturations stable on RA    3. LBBB - Suspect LBBB may be playing a role but only 159ms.   4. H/O Partial Nephrectomy, nephrolithiasis - SCr stable - follow BMP  -  SCr 0.89 today    Length of Stay: [place], MD  07/19/2021, 12:17 PM  Advanced Heart Failure Team  Pager [phone] (M-F; [place])  Please contact [facility] for night-coverage after hours (5p -7a ) and weekends on [email or web address]

## 2021-07-19 NOTE — Progress Notes (Signed)
Subjective:  Denies any chest pain or shortness of breath.  Tolerated left and right heart catheter yesterday and noted to have nonobstructive CAD  Objective:  Vital Signs in the last 24 hours: Temp:  [97.9 F (36.6 C)-98.6 F (37 C)] 98.5 F (36.9 C) (01/18 1138) Pulse Rate:  [63-81] 66 (01/18 1138) Resp:  [13-30] 18 (01/18 1138) BP: (95-153)/(61-90) 95/65 (01/18 1138) SpO2:  [96 %-100 %] 96 % (01/18 0423) Weight:  [92.8 kg] 92.8 kg (01/18 0423)  Intake/Output from previous day: 01/17 0701 - 01/18 0700 In: 1008.8 [P.O.:716; I.V.:292.8] Out: 600 [Urine:600] Intake/Output from this shift: Total I/O In: 240 [P.O.:240] Out: 100 [Urine:100]  Physical Exam: Unchanged. Right wrist cath. Site okay  Lab Results: Recent Labs    07/18/21 0429 07/18/21 1643 07/18/21 1647 07/19/21 0648  WBC 4.0  --   --  3.7*  HGB 10.7*   < > 10.5* 10.9*  PLT 274  --   --  272   < > = values in this interval not displayed.   Recent Labs    07/18/21 0429 07/18/21 1643 07/18/21 1647 07/19/21 0648  NA 141   < > 142 139  K 4.1   < > 3.9 4.4  CL 105  --   --  105  CO2 26  --   --  24  GLUCOSE 98  --   --  98  BUN 16  --   --  16  CREATININE 1.00  --   --  0.89   < > = values in this interval not displayed.   No results for input(s): TROPONINI in the last 72 hours.  Invalid input(s): CK, MB Hepatic Function Panel Recent Labs    07/19/21 0648  PROT 6.7  ALBUMIN 3.4*  AST 19  ALT 24  ALKPHOS 44  BILITOT 0.6   No results for input(s): CHOL in the last 72 hours. No results for input(s): PROTIME in the last 72 hours.  Imaging: CARDIAC CATHETERIZATION  Result Date: 07/18/2021   Dist LAD lesion is 20% stenosed.   Mid Cx lesion is 20% stenosed.   The left ventricular ejection fraction is less than 25% by visual estimate. Findings: Ao = 106/69 (86) LV = 107/29 RA =  6 RV = 42/7 PA = 41/22 (31) PCW = 21 Fick cardiac output/index = 6.8/3.1 PVR = 1.5 WU SVR = 941 Ao sat = 99% PA sat =  65%, 70% Assessment: 1. Minimal non-obstructive CAD 2. 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 Mildly elevated filling pressures with normal output Plan/Discussion: Continue to diurese. Titrate GDMT as BP tolerates. cMRI tomorrow. Glori Bickers, MD 5:07 PM  MR CARDIAC MORPHOLOGY W WO CONTRAST  Result Date: 07/19/2021 CLINICAL DATA:  Clinical question of cardiomyopathy Study assumes HCT of 35 and BSA of 2.19 m2. EXAM: CARDIAC MRI TECHNIQUE: The patient was scanned on a 1.5 Tesla GE magnet. A dedicated cardiac coil was used. Functional imaging was done using Fiesta sequences. 2,3, and 4 chamber views were done to assess for RWMA's. Modified Simpson's rule using a short axis stack was used to calculate an ejection fraction on a dedicated work Conservation officer, nature. The patient received 10 cc of Gadavist. After 10 minutes inversion recovery sequences were used to assess for infiltration and scar tissue. CONTRAST:  10 cc  of Gadavist FINDINGS: 1. Severely enlarged left ventricular size, with LVEDD 75 mm, and LVEDVi 176 mL/m2. Mild concentric increase in myocardial mass by indexed assessment, with intraventricular septal  thickness of 8 mm, posterior wall thickness of 7 mm, and myocardial mass index of 94 g/m2. Severely reduced left ventricular systolic function (LVEF =65 %). There is global hypokinesis. Global longitudinal strain -4.4%. Strain curves and wall motion are suggestive of left bundle branch block morphology. Left ventricular parametric mapping notable for T2 elevation in the subendocardial basal inferior, mid inferior, inferoseptal, anteroseptal, and anterior, & apical lateral (seen on T2STIR). Diffusely elevated ECV signal (34-% to 44%) worst in basal septum 44%. There is late gadolinium enhancement in the left ventricular myocardium: Small mid myocardial stripe basal lateral into mid lateral. There is no LV thrombus. 2. Mildly dilated right ventricular size by volume with RVEDVI 93 mL/m2. Normal right  ventricular thickness. Moderately reduced right ventricular systolic function (RVEF =53%). There is septal hypokinesis. 3.  Moderate dilated left atrium and right atrial size. 4. Normal size of the aortic root, ascending aorta and pulmonary artery. 5. Valve assessment: Aortic Valve: Tri-leaflet aortic valve with qualitatively trivial aortic regurgitation. Pulmonic Valve:qualitatively trivial regurgitation with prominent motion artifact. Tricuspid Valve: qualitatively trivial regurgitation. Mitral Valve: Trivial regurgitation. 6.  Normal pericardium.  No pericardial effusion. 7. Grossly, no extracardiac findings. Recommended dedicated study if concerned for non-cardiac pathology IMPRESSION: Severely reduced LV function. Though modified [facility] Criteria for myocarditis is technically met, LGE and parametric mapping are not classic for this and clinical correlation is advised. Rudean Haskell MD Electronically Signed   By: Rudean Haskell M.D.   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  Prediabetic Plan Continue present management as per advanced heart failure team. I will sign off.  Please call if needed. Follow-up with me in 2 weeks/when necessary  LOS: 6 days    Charolette Forward 07/19/2021, 3:11 PM

## 2021-07-19 NOTE — TOC Initial Note (Signed)
Transition of Care [facility]) - Initial/Assessment Note    Patient Details  Name: Lynn Estrada MRN: [id number] Date of Birth: 06-24-1961  Transition of Care [facility]) CM/SW Contact:    Zenon Mayo, RN Phone Number: 07/19/2021, 10:45 AM  Clinical Narrative:                 NCM spoke with patient, she wanted to know if she could get disability started while she is here in the hospital.  NCM contacted [facility] in financial counseling, she states she will contact patient on her cell phone.  Also Staff RN Clarene Critchley will give her paper work to fill out to get information from medical records.   Expected Discharge Plan: [place] Barriers to Discharge: Continued Medical Work up   Patient Goals and CMS Choice Patient states their goals for this hospitalization and ongoing recovery are:: to go home CMS Medicare.gov Compare Post Acute Care list provided to:: Patient    Expected Discharge Plan and Services Expected Discharge Plan: [place]   Discharge Planning Services: CM Consult Post Acute Care Choice: [place] arrangements for the past 2 months: [place] Arranged: Disease Management [place] Agency: [place] Date [place]: 07/13/21 Time [place]: 1352 Representative spoke with at [place]: stacie with [facility]  Prior Living Arrangements/Services Living arrangements for the past 2 months: [place] with:: Spouse Patient language and need for interpreter reviewed:: Yes Do you feel safe going back to the place where you live?: Yes            Criminal Activity/Legal Involvement Pertinent to Current Situation/Hospitalization: No - Comment as needed  Activities of Daily Living Home Assistive Devices/Equipment: Walker (specify type), Eyeglasses ADL Screening (condition at time of admission) Patient's cognitive ability adequate to safely  complete daily activities?: Yes Is the patient deaf or have difficulty hearing?: No Does the patient have difficulty seeing, even when wearing glasses/contacts?: No Does the patient have difficulty concentrating, remembering, or making decisions?: No Patient able to express need for assistance with ADLs?: Yes Does the patient have difficulty dressing or bathing?: No Independently performs ADLs?: Yes (appropriate for developmental age) Does the patient have difficulty walking or climbing stairs?: Yes Weakness of Legs: Both Weakness of Arms/Hands: None  Permission Sought/Granted                  Emotional Assessment Appearance:: Appears stated age Attitude/Demeanor/Rapport: Engaged Affect (typically observed): Calm Orientation: : Oriented to Self, Oriented to Place, Oriented to  Time, Oriented to Situation Alcohol / Substance Use: Not Applicable Psych Involvement: No (comment)  Admission diagnosis:  Shortness of breath [R06.02] Volume overload [E87.70] Patient Active Problem List   Diagnosis Date Noted   Acute systolic heart failure (HCC)    Volume overload 07/12/2021   Hypocalcemia 07/12/2021   Elevated troponin 07/12/2021   Suspected COVID-19 virus infection 06/11/2019   Acute respiratory failure with hypoxia ([place]) 06/11/2019   Multifocal pneumonia 06/11/2019   Ureteral stone with hydronephrosis 12/31/2016   Normocytic anemia 07/10/2016   Essential hypertension 07/26/2015   Eczema 07/26/2015   BMI 29.0-29.9,adult 07/26/2015   PCP:  [name], PA Pharmacy:   [id number] [address] [place] [address] Phone:  [phone] Fax: [phone]     Social Determinants of Health (SDOH) Interventions    Readmission Risk Interventions No flowsheet data found.

## 2021-07-19 NOTE — Progress Notes (Signed)
[facility] Progress Note  Patient: Lynn Estrada    HAL:[id number]  DOA: 07/12/2021    Date of Service: the patient was seen and examined on 07/19/2021  Brief hospital course: 61 year old African-American female with past medical history for hypertension and renal stones presented to the emergency room on 1/11 for shortness of breath and found to be hypoxic.  Elevated BNP of 400.  Echocardiogram done noted ejection fraction of 20-25% and grade 2 diastolic dysfunction.  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  Patient diuresed and noted to be dizzy with standing.  [facility] for left heart catheterization which was done 1/18 noting minimal CAD.  Cardiac MRI done 1/18 confirmed poor ejection fraction.  Assessment and Plan: Cardiovascular and Mediastinum Essential hypertension Assessment & Plan More hypotensive today.  In part due to diuresis?  Continue to follow  * Acute combined systolic (congestive) and diastolic (congestive) heart failure ([place]) Assessment & Plan Cause unclear.  Cardiac catheterization revealed minimal CAD.  Possible due to left bundle branch block.  Appreciate cardiology help.  Respiratory Acute respiratory failure with hypoxia ([facility]) Assessment & Plan Oxygen able to be weaned off.  Secondary to CHF  Other Overweight (BMI 25.0-29.9) Assessment & Plan Meets criteria BMI greater than 25    Body mass index is 26.99 kg/m.  Nutrition Problem: Increased nutrient needs Etiology: acute illness     Consultants: Cardiology Heart failure team  Procedures: Stress test 2D echo Left and right heart catheterization  Antimicrobials: None  Code Status: Full code   Subjective: Fatigued, lightheaded when standing  Objective: Noted soft blood pressures Vitals:   07/19/21 0955 07/19/21 1138  BP:  95/65  Pulse: 80 66  Resp:  18  Temp:  98.5 F (36.9 C)  SpO2:      Intake/Output  Summary (Last 24 hours) at 07/19/2021 1730 Last data filed at 07/19/2021 1305 Gross per 24 hour  Intake 996.24 ml  Output 400 ml  Net 596.24 ml   Filed Weights   07/17/21 1759 07/18/21 0206 07/19/21 0423  Weight: 93.9 kg 93 kg 92.8 kg   Body mass index is 26.99 kg/m.  Exam:  General: Alert and oriented x3, fatigued HEENT: Normocephalic atraumatic, mucous membranes are moist Cardiovascular: Regular rate and rhythm, S1-S2, 2 out of 6 stock ejection murmur Respiratory: Clear to auscultation bilaterally Abdomen: Soft, nontender, nondistended, positive bowel sounds Musculoskeletal: No clubbing or cyanosis, trace pitting edema Skin: No skin breaks, tears or lesions Psychiatry: Appropriate, no evidence of psychoses Neurology: No focal deficits  Data Reviewed: Today's labs reviewed.  Slightly low white blood cell count of 3.7.  Hemoglobin stable at 10.9  Disposition:  Status is: Inpatient  Remains inpatient appropriate because: Completion of diuresis, outpatient cardiac follow-up   Family Communication: Left message for husband DVT Prophylaxis: enoxaparin (LOVENOX) injection 40 mg Start: 07/19/21 0800    Author: Annita Brod ,MD 07/19/2021 5:30 PM  To reach On-call, see care teams to locate the attending and reach out via [email or web address]. Between 7PM-7AM, please contact night-coverage If you still have difficulty reaching the attending provider, please page the [facility] (Director on Call) for [facility] on amion for assistance.

## 2021-07-19 NOTE — Hospital Course (Addendum)
61 year old African-American female with past medical history for hypertension and renal stones presented to the emergency room on 1/11 for shortness of breath and found to be hypoxic.  Elevated BNP of 400.  Echocardiogram done noted ejection fraction of 20-25% and grade 2 diastolic dysfunction.  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  Patient diuresed and noted to be dizzy with standing.  [facility] for left heart catheterization which was done 1/18 noting minimal CAD.  Cardiac MRI done 1/18 confirmed poor ejection fraction, with possibility of myocarditis as etiology.  Cleared by cardiology for discharge on 1/19.

## 2021-07-19 NOTE — Assessment & Plan Note (Signed)
Meets criteria BMI greater than 25 

## 2021-07-19 NOTE — Assessment & Plan Note (Addendum)
Blood pressure much better following diuresis.  Appreciate cardiology help.  Lipid.  Discharged on Lasix and spironolactone

## 2021-07-20 ENCOUNTER — Other Ambulatory Visit (HOSPITAL_COMMUNITY): Payer: Self-pay

## 2021-07-20 DIAGNOSIS — I5041 Acute combined systolic (congestive) and diastolic (congestive) heart failure: Secondary | ICD-10-CM

## 2021-07-20 LAB — BASIC METABOLIC PANEL
Anion gap: 8 (ref 5–15)
BUN: 16 mg/dL (ref 6–20)
CO2: 25 mmol/L (ref 22–32)
Calcium: 8.5 mg/dL — ABNORMAL LOW (ref 8.9–10.3)
Chloride: 106 mmol/L (ref 98–111)
Creatinine, Ser: 0.9 mg/dL (ref 0.44–1.00)
GFR, Estimated: >60 mL/min (ref >60)
Glucose, Bld: 102 mg/dL — ABNORMAL HIGH (ref 70–99)
Potassium: 4.2 mmol/L (ref 3.5–5.1)
Sodium: 139 mmol/L (ref 135–145)

## 2021-07-20 MED ORDER — EMPAGLIFLOZIN 10 MG PO TABS
10.0000 mg | ORAL_TABLET | Freq: Every day | ORAL | 2 refills | Status: DC
  Filled 2021-07-20: qty 30, 30d supply, fill #0

## 2021-07-20 MED ORDER — SPIRONOLACTONE 25 MG PO TABS
25.0000 mg | ORAL_TABLET | Freq: Every day | ORAL | 2 refills | Status: DC
  Filled 2021-07-20: qty 30, 30d supply, fill #0

## 2021-07-20 MED ORDER — DIGOXIN 125 MCG PO TABS
0.1250 mg | ORAL_TABLET | Freq: Every day | ORAL | 2 refills | Status: DC
  Filled 2021-07-20: qty 30, 30d supply, fill #0

## 2021-07-20 MED ORDER — FUROSEMIDE 40 MG PO TABS
40.0000 mg | ORAL_TABLET | Freq: Every day | ORAL | 2 refills | Status: DC
  Filled 2021-07-20: qty 30, 30d supply, fill #0

## 2021-07-20 MED ORDER — SPIRONOLACTONE 25 MG PO TABS
25.0000 mg | ORAL_TABLET | Freq: Every day | ORAL | Status: DC
  Administered 2021-07-20: 25 mg via ORAL
  Filled 2021-07-20: qty 1

## 2021-07-20 NOTE — Progress Notes (Signed)
Patient discharging home. Vital signs stable at time of discharge as reflected in discharge summary. Discharge instructions given and verbal understanding returned. Patient discharging with filled prescriptions. Follow up appointments scheduled.

## 2021-07-20 NOTE — Discharge Summary (Addendum)
Physician Discharge Summary   Patient name: Lynn Estrada  Admit date:     07/12/2021  Discharge date: '@TODAY' @   Discharge Physician: Annita Brod   PCP: [name], PA   Recommendations at discharge: Patient will follow up with the advanced heart failure clinic. New medication: Lasix 40 mg p.o. daily New medication: Spironolactone 25 mg p.o. daily New medication: Atorvastatin 10 mg p.o. daily New medication: Digoxin 0.125 mg p.o. daily New medication: Jardiance 10 mg p.o. daily   Hospital Course   61 year old African-American female with past medical history for hypertension and renal stones presented to the emergency room on 1/11 for shortness of breath and found to be hypoxic.  Elevated BNP of 400.  Echocardiogram done noted ejection fraction of 20-25% and grade 2 diastolic dysfunction.  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  Patient diuresed and noted to be dizzy with standing.  [facility]red [facility] for left heart catheterization which was done 1/18 noting minimal CAD.  Cardiac MRI done 1/18 confirmed poor ejection fraction, with possibility of myocarditis as etiology.  Cleared by cardiology for discharge on 1/19.   Discharge Diagnoses Cardiovascular and Mediastinum Essential hypertension Assessment & Plan Blood pressure much better following diuresis.  Appreciate cardiology help.  Lipid.  Discharged on Lasix and spironolactone  * Acute combined systolic (congestive) and diastolic (congestive) heart failure ([facility]) Assessment & Plan Cause unclear.  Cardiac catheterization revealed minimal CAD.  Possible due to left bundle branch block.  Appreciate cardiology help.  Stable for discharge today.  Follow-up in acute heart failure clinic.  Continue digoxin, statin, diuretics and Jardiance.  Respiratory Acute respiratory failure with hypoxia (HCC) Assessment & Plan Oxygen able to be weaned off.  Secondary to  CHF  Other Overweight (BMI 25.0-29.9) Assessment & Plan Meets criteria BMI greater than 25     Body mass index is 27.13 kg/m.  Nutrition Problem: Increased nutrient needs Etiology: acute illness Nutrition Interventions: Interventions: Magic cup, MVI   Consultants Cardiology Heart failure team  Procedures:  Stress test 2D echo Left and right heart catheterization  Condition at discharge: good  Exam General: Alert and oriented x3, no acute distress Cardiovascular: Regular rate and rhythm, S1-S2 Lungs: Clear to auscultation bilaterally  Disposition: Home  Discharge time: less than 30 minutes.  Follow-up Information     [facility] Follow up.   Specialty: Cardiology Why: 1/26 at 10:30 AM at the Advanced Heart Failure Clinc at [facility], [place] (Dr. Clayborne Dana office) Contact information: [address] [phone] [place] [address] [phone]        [name], [place]. Go in 6 day(s).   Specialty: Family Medicine Why: Follow up with Dr. Jacqulynn Cadet on Monday 07/25/21 at [place] information: [address]  [id number] [phone]                 Allergies as of 07/20/2021       Reactions   Bactrim [sulfamethoxazole-trimethoprim] Rash   Latex Rash        Medication List     STOP taking these medications    amLODipine 10 MG tablet Commonly known as: NORVASC   naproxen 500 MG tablet Commonly known as: NAPROSYN   predniSONE 10 MG tablet Commonly known as: DELTASONE   senna 8.6 MG Tabs tablet Commonly known as: SENOKOT       TAKE these medications  aspirin 81 MG EC tablet 81 mg daily.   atorvastatin 10 MG tablet Commonly known as: LIPITOR Take 10 mg by mouth daily.   digoxin 0.125 MG tablet Commonly known as: LANOXIN Take 1 tablet (0.125 mg total) by mouth daily. Start taking on: July 21, 2021    empagliflozin 10 MG Tabs tablet Commonly known as: JARDIANCE Take 1 tablet (10 mg total) by mouth daily. Start taking on: July 21, 2021   furosemide 40 MG tablet Commonly known as: LASIX Take 1 tablet (40 mg total) by mouth daily. Start taking on: July 21, 2021   guaiFENesin-dextromethorphan 100-10 MG/5ML syrup Commonly known as: ROBITUSSIN DM Take 10 mLs by mouth every 4 (four) hours as needed for cough. What changed: how much to take   MULTIVITAMIN ADULT PO Take 1 tablet by mouth daily.   spironolactone 25 MG tablet Commonly known as: ALDACTONE Take 1 tablet (25 mg total) by mouth daily. Start taking on: July 21, 2021   Vitamin D (Ergocalciferol) 1.25 MG (50000 UNIT) Caps capsule Commonly known as: DRISDOL Take 50,000 Units by mouth once a week. Mondays        Filed Weights   07/18/21 0206 07/19/21 0423 07/20/21 0448  Weight: 93 kg 92.8 kg 93.3 kg    DG Chest 2 View  Result Date: 07/12/2021 CLINICAL DATA:  Cough, chest pain, and shortness of breath for 1 month. Swelling in ankles. Dyspnea when lying flat. EXAM: CHEST - 2 VIEW COMPARISON:  AP chest 06/29/2019 FINDINGS: Cardiac silhouette is again at the upper limits of normal size for AP technique. Mild calcification within aortic arch with normal mediastinal contours. Increased moderate bilateral interstitial thickening. Likely small bilateral pleural effusions. Bibasilar mild heterogeneous opacities possible subsegmental atelectasis. No pneumothorax. Mild multilevel degenerative disc changes of the thoracic spine. IMPRESSION: New moderate interstitial pulmonary edema. Bibasilar likely subsegmental atelectasis. Electronically Signed   By: Yvonne Kendall   On: 07/12/2021 11:21   CARDIAC CATHETERIZATION  Result Date: 07/18/2021   Dist LAD lesion is 20% stenosed.   Mid Cx lesion is 20% stenosed.   The left ventricular ejection fraction is less than 25% by visual estimate. Findings: Ao = 106/69 (86) LV = 107/29 RA =   6 RV = 42/7 PA = 41/22 (31) PCW = 21 Fick cardiac output/index = 6.8/3.1 PVR = 1.5 WU SVR = 941 Ao sat = 99% PA sat = 65%, 70% Assessment: 1. Minimal non-obstructive CAD 2. 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 Mildly elevated filling pressures with normal output Plan/Discussion: Continue to diurese. Titrate GDMT as BP tolerates. cMRI tomorrow. Glori Bickers, MD 5:07 PM  NM Myocar Multi W/Spect Tamela Oddi Motion / EF  Result Date: 07/15/2021 CLINICAL DATA:  Chest pain.  Shortness of breath. EXAM: MYOCARDIAL IMAGING WITH SPECT (REST AND PHARMACOLOGIC-STRESS) GATED LEFT VENTRICULAR WALL MOTION STUDY LEFT VENTRICULAR EJECTION FRACTION TECHNIQUE: Standard myocardial SPECT imaging was performed after resting intravenous injection of 10.5 mCi [id number]. Subsequently, intravenous infusion of Lexiscan was performed under the supervision of the Cardiology staff. At peak effect of the drug, 31.1 mCi Tc-927metrofosmin was injected intravenously and standard myocardial SPECT imaging was performed. Quantitative gated imaging was also performed to evaluate left ventricular wall motion, and estimate left ventricular ejection fraction. COMPARISON:  None. FINDINGS: Perfusion: No reversible myocardial perfusion defects are seen. A large fixed myocardial perfusion defect is seen on both stress and resting images involving the mid and basilar portions of the anterior wall, likely due to myocardial infarct. A moderate fixed  perfusion defect is seen on both stress and resting images involving the inferior wall, which may be due to myocardial infarct or diaphragmatic attenuation Wall Motion: Global left ventricular hypokinesis is demonstrated, with severe left ventricular dilatation. Left Ventricular Ejection Fraction: 20 % End diastolic volume 701 ml End systolic volume 779 ml IMPRESSION: 1. No evidence of reversible myocardial ischemia. 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 2. Severe left ventricular dilatation and global hypokinesis. 3. Left ventricular ejection fraction 20% 4. Non invasive risk stratification*: High *2012 Appropriate Use Criteria for Coronary Revascularization Focused Update: J Am Coll Cardiol. 3903;00(9):233-007. http://content.airportbarriers.com.aspx?articleid=[email or web address] Electronically Signed   By: Marlaine Hind M.D.   On: 07/15/2021 13:37   MR CARDIAC MORPHOLOGY W WO CONTRAST  Result Date: 07/19/2021 CLINICAL DATA:  Clinical question of cardiomyopathy Study assumes HCT of 35 and BSA of 2.19 m2. EXAM: CARDIAC MRI TECHNIQUE: The patient was scanned on a 1.5 Tesla GE magnet. A dedicated cardiac coil was used. Functional imaging was done using Fiesta sequences. 2,3, and 4 chamber views were done to assess for RWMA's. Modified Simpson's rule using a short axis stack was used to calculate an ejection fraction on a dedicated work Conservation officer, nature. The patient received 10 cc of Gadavist. After 10 minutes inversion recovery sequences were used to assess for infiltration and scar tissue. CONTRAST:  10 cc  of Gadavist FINDINGS: 1. Severely enlarged left ventricular size, with LVEDD 75 mm, and LVEDVi 176 mL/m2. Mild concentric increase in myocardial mass by indexed assessment, with intraventricular septal thickness of 8 mm, posterior wall thickness of 7 mm, and myocardial mass index of 94 g/m2. Severely reduced left ventricular systolic function (LVEF =62 %). There is global hypokinesis. Global longitudinal strain -4.4%. Strain curves and wall motion are suggestive of left bundle branch block morphology. Left ventricular parametric mapping notable for T2 elevation in the subendocardial basal inferior, mid inferior, inferoseptal, anteroseptal, and anterior, & apical lateral (seen on T2STIR). Diffusely elevated ECV signal (34-% to 44%) worst in basal septum 44%. There is late gadolinium enhancement in the left ventricular myocardium: Small mid  myocardial stripe basal lateral into mid lateral. There is no LV thrombus. 2. Mildly dilated right ventricular size by volume with RVEDVI 93 mL/m2. Normal right ventricular thickness. Moderately reduced right ventricular systolic function (RVEF =26%). There is septal hypokinesis. 3.  Moderate dilated left atrium and right atrial size. 4. Normal size of the aortic root, ascending aorta and pulmonary artery. 5. Valve assessment: Aortic Valve: Tri-leaflet aortic valve with qualitatively trivial aortic regurgitation. Pulmonic Valve:qualitatively trivial regurgitation with prominent motion artifact. Tricuspid Valve: qualitatively trivial regurgitation. Mitral Valve: Trivial regurgitation. 6.  Normal pericardium.  No pericardial effusion. 7. Grossly, no extracardiac findings. Recommended dedicated study if concerned for non-cardiac pathology IMPRESSION: Severely reduced LV function. Though modified [facility] Criteria for myocarditis is technically met, LGE and parametric mapping are not classic for this and clinical correlation is advised. Rudean Haskell MD Electronically Signed   By: Rudean Haskell M.D.   On: 07/19/2021 13:31   ECHOCARDIOGRAM COMPLETE  Result Date: 07/13/2021    ECHOCARDIOGRAM REPORT   Patient Name:   Lynn Estrada Date of Exam: 07/13/2021 Medical Rec #:  [id number]         Height:       73.0 in Accession #:    [id number]        Weight:       207.9 lb Date of Birth:  1961-02-18        BSA:          2.187 m Patient Age:    59 years          BP:           119/77 mmHg Patient Gender: F                 HR:           93 bpm. Exam Location:  Inpatient Procedure: 2D Echo, Cardiac Doppler and Color Doppler Indications:    CHF  History:        Patient has no prior history of Echocardiogram examinations.                 Risk Factors:Hypertension. GERD.  Sonographer:    Beryle Beams Referring Phys: [id number] DAVID MANUEL [place]  1. Left ventricular ejection fraction, by estimation,  is 20 to 25%. The left ventricle has severely decreased function. The left ventricle demonstrates global hypokinesis with septal-lateral dyssynchrony consistent with LBBB. The left ventricular internal cavity size was moderately dilated. Left ventricular diastolic parameters are consistent with Grade II diastolic dysfunction (pseudonormalization).  2. Right ventricular systolic function is normal. The right ventricular size is normal. Tricuspid regurgitation signal is inadequate for assessing PA pressure.  3. Left atrial size was moderately dilated.  4. The mitral valve is normal in structure. Trivial mitral valve regurgitation. No evidence of mitral stenosis.  5. The aortic valve is tricuspid. Aortic valve regurgitation is trivial. No aortic stenosis is present.  6. The inferior vena cava is normal in size with <50% respiratory variability, suggesting right atrial pressure of 8 mmHg. FINDINGS  Left Ventricle: Left ventricular ejection fraction, by estimation, is 20 to 25%. The left ventricle has severely decreased function. The left ventricle demonstrates global hypokinesis. The left ventricular internal cavity size was moderately dilated. There is no left ventricular hypertrophy. Left ventricular diastolic parameters are consistent with Grade II diastolic dysfunction (pseudonormalization). Right Ventricle: The right ventricular size is normal. No increase in right ventricular wall thickness. Right ventricular systolic function is normal. Tricuspid regurgitation signal is inadequate for assessing PA pressure. Left Atrium: Left atrial size was moderately dilated. Right Atrium: Right atrial size was normal in size. Pericardium: Trivial pericardial effusion is present. Mitral Valve: The mitral valve is normal in structure. Trivial mitral valve regurgitation. No evidence of mitral valve stenosis. Tricuspid Valve: The tricuspid valve is normal in structure. Tricuspid valve regurgitation is not demonstrated. Aortic  Valve: The aortic valve is tricuspid. Aortic valve regurgitation is trivial. Aortic regurgitation PHT measures 408 msec. No aortic stenosis is present. Aortic valve mean gradient measures 7.0 mmHg. Aortic valve peak gradient measures 11.4 mmHg. Aortic valve area, by VTI measures 3.02 cm. Pulmonic Valve: The pulmonic valve was normal in structure. Pulmonic valve regurgitation is not visualized. Aorta: The aortic root is normal in size and structure. Venous: The inferior vena cava is normal in size with less than 50% respiratory variability, suggesting right atrial pressure of 8 mmHg. IAS/Shunts: No atrial level shunt detected by color flow Doppler.  LEFT VENTRICLE PLAX 2D LVIDd:         6.70 cm      Diastology LVIDs:         5.80 cm      LV e' medial:  5.11 cm/s LV PW:         1.10 cm      LV e' lateral: 11.60 cm/s LV IVS:  0.80 cm LVOT diam:     2.10 cm LV SV:         95 LV SV Index:   43 LVOT Area:     3.46 cm  LV Volumes (MOD) LV vol d, MOD A2C: 187.0 ml LV vol d, MOD A4C: 159.0 ml LV vol s, MOD A2C: 132.0 ml LV vol s, MOD A4C: 105.5 ml LV SV MOD A2C:     55.0 ml LV SV MOD A4C:     159.0 ml LV SV MOD BP:      62.9 ml RIGHT VENTRICLE             IVC RV S prime:     12.00 cm/s  IVC diam: 1.80 cm TAPSE (M-mode): 1.8 cm LEFT ATRIUM              Index        RIGHT ATRIUM           Index LA diam:        4.40 cm  2.01 cm/m   RA Area:     16.10 cm LA Vol (A2C):   84.3 ml  38.54 ml/m  RA Volume:   46.60 ml  21.30 ml/m LA Vol (A4C):   112.0 ml 51.20 ml/m LA Biplane Vol: 100.0 ml 45.72 ml/m  AORTIC VALVE                     PULMONIC VALVE AV Area (Vmax):    3.28 cm      PV Vmax:       0.67 m/s AV Area (Vmean):   2.60 cm      PV Peak grad:  1.8 mmHg AV Area (VTI):     3.02 cm AV Vmax:           169.00 cm/s AV Vmean:          126.000 cm/s AV VTI:            0.313 m AV Peak Grad:      11.4 mmHg AV Mean Grad:      7.0 mmHg LVOT Vmax:         160.00 cm/s LVOT Vmean:        94.700 cm/s LVOT VTI:          0.273 m  LVOT/AV VTI ratio: 0.87 AI PHT:            408 msec  AORTA Ao Root diam: 2.90 cm Ao Asc diam:  2.80 cm  SHUNTS Systemic VTI:  0.27 m Systemic Diam: 2.10 cm Dalton McleanMD Electronically signed by Franki Monte Signature Date/Time: 07/13/2021/5:23:32 PM    Final    Results for orders placed or performed during the hospital encounter of 07/12/21  Resp Panel by RT-PCR (Flu A&B, Covid) Nasopharyngeal Swab     Status: None   Collection Time: 07/12/21 10:48 AM   Specimen: Nasopharyngeal Swab; Nasopharyngeal(NP) swabs in vial transport medium  Result Value Ref Range Status   SARS Coronavirus 2 by RT PCR NEGATIVE NEGATIVE Final    Comment: (NOTE) SARS-CoV-2 target nucleic acids are NOT DETECTED.  The SARS-CoV-2 RNA is generally detectable in upper respiratory specimens during the acute phase of infection. The lowest concentration of SARS-CoV-2 viral copies this assay can detect is 138 copies/mL. A negative result does not preclude SARS-Cov-2 infection and should not be used as the sole basis for treatment or other patient management decisions. A negative result may occur with  improper  specimen collection/handling, submission of specimen other than nasopharyngeal swab, presence of viral mutation(s) within the areas targeted by this assay, and inadequate number of viral copies(<138 copies/mL). A negative result must be combined with clinical observations, patient history, and epidemiological information. The expected result is Negative.  Fact Sheet for Patients:  EntrepreneurPulse.com.au  Fact Sheet for Healthcare Providers:  IncredibleEmployment.be  This test is no t yet approved or cleared by the [place] FDA and  has been authorized for detection and/or diagnosis of SARS-CoV-2 by FDA under an Emergency Use Authorization (EUA). This EUA will remain  in effect (meaning this test can be used) for the duration of the COVID-19 declaration under Section  564(b)(1) of the Act, 21 U.S.C.section 360bbb-3(b)(1), unless the authorization is terminated  or revoked sooner.       Influenza A by PCR NEGATIVE NEGATIVE Final   Influenza B by PCR NEGATIVE NEGATIVE Final    Comment: (NOTE) The Xpert Xpress SARS-CoV-2/FLU/RSV plus assay is intended as an aid in the diagnosis of influenza from Nasopharyngeal swab specimens and should not be used as a sole basis for treatment. Nasal washings and aspirates are unacceptable for Xpert Xpress SARS-CoV-2/FLU/RSV testing.  Fact Sheet for Patients: EntrepreneurPulse.com.au  Fact Sheet for Healthcare Providers: IncredibleEmployment.be  This test is not yet approved or cleared by the [place] FDA and has been authorized for detection and/or diagnosis of SARS-CoV-2 by FDA under an Emergency Use Authorization (EUA). This EUA will remain in effect (meaning this test can be used) for the duration of the COVID-19 declaration under Section 564(b)(1) of the Act, 21 U.S.C. section 360bbb-3(b)(1), unless the authorization is terminated or revoked.  Performed at [facility], [place] [address] [place], [place] [address]   Respiratory (~20 pathogens) panel by PCR     Status: None   Collection Time: 07/13/21 10:09 PM   Specimen: Nasopharyngeal Swab; Respiratory  Result Value Ref Range Status   Adenovirus NOT DETECTED NOT DETECTED Final   Coronavirus 229E NOT DETECTED NOT DETECTED Final    Comment: (NOTE) The Coronavirus on the Respiratory Panel, DOES NOT test for the novel  Coronavirus (2019 nCoV)    Coronavirus HKU1 NOT DETECTED NOT DETECTED Final   Coronavirus NL63 NOT DETECTED NOT DETECTED Final   Coronavirus OC43 NOT DETECTED NOT DETECTED Final   Metapneumovirus NOT DETECTED NOT DETECTED Final   Rhinovirus / Enterovirus NOT DETECTED NOT DETECTED Final   Influenza A NOT DETECTED NOT DETECTED Final   Influenza B NOT DETECTED NOT DETECTED Final    Parainfluenza Virus 1 NOT DETECTED NOT DETECTED Final   Parainfluenza Virus 2 NOT DETECTED NOT DETECTED Final   Parainfluenza Virus 3 NOT DETECTED NOT DETECTED Final   Parainfluenza Virus 4 NOT DETECTED NOT DETECTED Final   Respiratory Syncytial Virus NOT DETECTED NOT DETECTED Final   Bordetella pertussis NOT DETECTED NOT DETECTED Final   Bordetella Parapertussis NOT DETECTED NOT DETECTED Final   Chlamydophila pneumoniae NOT DETECTED NOT DETECTED Final   Mycoplasma pneumoniae NOT DETECTED NOT DETECTED Final    Comment: Performed at [facility], [place]. [address] [place], [place] [address]   Recent Labs  Lab 07/14/21 0335 07/15/21 0420 07/18/21 0429 07/18/21 1643 07/18/21 1646 07/18/21 1647 07/19/21 0648  WBC 6.3  --  4.0  --   --   --  3.7*  NEUTROABS  --   --  2.0  --   --   --  1.9  HGB 11.0*   < >  10.7* 10.5* 9.5* 10.5* 10.9*  HCT 35.4*   < > 34.4* 31.0* 28.0* 31.0* 35.1*  MCV 88.5  --  88.4  --   --   --  88.2  PLT 280  --  274  --   --   --  272   < > = values in this interval not displayed.   Recent Labs  Lab 07/14/21 0335 07/15/21 0420 07/16/21 9024 07/17/21 0323 07/18/21 0429 07/18/21 1643 07/18/21 1646 07/18/21 1647 07/19/21 0648 07/20/21 0332  NA '137 140 139 138 141 142 145 142 139 139 '  K 3.3* 4.0 4.0 4.1 4.1 3.9 3.3* 3.9 4.4 4.2  CL 103 106 102 104 105  --   --   --  105 106  CO2 '24 29 29 26 26  ' --   --   --  24 25  GLUCOSE 106* 122* 120* 109* 98  --   --   --  98 102*  BUN 21* 22* 19 21* 16  --   --   --  16 16  CREATININE 0.77 0.87 1.00 0.97 1.00  --   --   --  0.89 0.90  CALCIUM 8.5* 8.6* 9.0 8.7* 8.6*  --   --   --  8.9 8.5*  MG 2.1 2.2  --   --   --   --   --   --  2.3  --   PHOS 3.8 3.7  --   --   --   --   --   --  4.0  --    Recent Labs  Lab 07/14/21 0335 07/15/21 0420 07/19/21 0648  AST  --   --  19  ALT  --   --  24  ALKPHOS  --   --  44  BILITOT  --   --  0.6  PROT  --   --  6.7  ALBUMIN 3.5 3.3* 3.4*   No results for input(s):  GLUCAP in the last 168 hours.  Author: Annita Brod [facility] 07/20/2021, 2:58 PM

## 2021-07-20 NOTE — Progress Notes (Addendum)
Advanced Heart Failure Rounding Note  PCP-Cardiologist: None   Subjective:    R/L cath w/ Minimal CAD. EF 20%, Mildly elevated filling pressures and normal CO.  cMRI ? Myocarditis (see impression below). LVEF 18%  On PO Lasix, UOP not robust. 400 cc UOP + 1 unmeasured void yesterday. Wt up 1 lb. BP still too soft for Entresto, 84T-XMI 680H systolic.   She still feels mildly SOB w/ activity. No resting dyspnea. Anxious about going home.   SCr 0.90 K 4.2    RHC Ao = 106/69 (86) LV = 107/29 RA =  6 RV = 42/7 PA = 41/22 (31) PCW = 21 Fick cardiac output/index = 6.8/3.1 PVR = 1.5 WU SVR = 941 Ao sat = 99% PA sat = 65%, 70%  cMRI  IMPRESSION: Severely reduced LV function.   Though modified [facility] Criteria for myocarditis is technically met, LGE and parametric mapping are not classic for this and clinical correlation is advised.  Objective:   Weight Range: 93.3 kg Body mass index is 27.13 kg/m.   Vital Signs:   Temp:  [98.4 F (36.9 C)-98.5 F (36.9 C)] 98.4 F (36.9 C) (01/19 0448) Pulse Rate:  [66-80] 80 (01/19 0448) Resp:  [18] 18 (01/19 0448) BP: (95-110)/(65-76) 100/72 (01/19 0448) SpO2:  [96 %-97 %] 96 % (01/19 0448) Weight:  [93.3 kg] 93.3 kg (01/19 0448) Last BM Date: 07/19/21  Weight change: Filed Weights   07/18/21 0206 07/19/21 0423 07/20/21 0448  Weight: 93 kg 92.8 kg 93.3 kg    Intake/Output:   Intake/Output Summary (Last 24 hours) at 07/20/2021 0759 Last data filed at 07/20/2021 0447 Gross per 24 hour  Intake 600 ml  Output 400 ml  Net 200 ml      Physical Exam    General:  Well appearing. No respiratory difficulty HEENT: normal Neck: supple. JVD 6 cm. Carotids 2+ bilat; no bruits. No lymphadenopathy or thyromegaly appreciated. Cor: PMI nondisplaced. Regular rate & rhythm. No rubs, gallops or murmurs. Lungs: clear Abdomen: soft, nontender, nondistended. No hepatosplenomegaly. No bruits or masses. Good bowel  sounds. Extremities: no cyanosis, clubbing, rash, edema Neuro: alert & oriented x 3, cranial nerves grossly intact. moves all 4 extremities w/o difficulty. Affect pleasant.   Telemetry   NSR 60s Personally reviewed   Labs    CBC Recent Labs    07/18/21 0429 07/18/21 1643 07/18/21 1647 07/19/21 0648  WBC 4.0  --   --  3.7*  NEUTROABS 2.0  --   --  1.9  HGB 10.7*   < > 10.5* 10.9*  HCT 34.4*   < > 31.0* 35.1*  MCV 88.4  --   --  88.2  PLT 274  --   --  272   < > = values in this interval not displayed.   Basic Metabolic Panel Recent Labs    07/19/21 0648 07/20/21 0332  NA 139 139  K 4.4 4.2  CL 105 106  CO2 24 25  GLUCOSE 98 102*  BUN 16 16  CREATININE 0.89 0.90  CALCIUM 8.9 8.5*  MG 2.3  --   PHOS 4.0  --    Liver Function Tests Recent Labs    07/19/21 0648  AST 19  ALT 24  ALKPHOS 44  BILITOT 0.6  PROT 6.7  ALBUMIN 3.4*   No results for input(s): LIPASE, AMYLASE in the last 72 hours. Cardiac Enzymes No results for input(s): CKTOTAL, CKMB, CKMBINDEX, TROPONINI in the last 72 hours.  BNP: BNP (last 3 results) Recent Labs    07/12/21 1047 07/14/21 0335  BNP 394.3* 247.1*    ProBNP (last 3 results) No results for input(s): PROBNP in the last 8760 hours.   D-Dimer No results for input(s): DDIMER in the last 72 hours. Hemoglobin A1C No results for input(s): HGBA1C in the last 72 hours. Fasting Lipid Panel No results for input(s): CHOL, HDL, LDLCALC, TRIG, CHOLHDL, LDLDIRECT in the last 72 hours. Thyroid Function Tests No results for input(s): TSH, T4TOTAL, T3FREE, THYROIDAB in the last 72 hours.  Invalid input(s): FREET3  Other results:   Imaging    MR CARDIAC MORPHOLOGY W WO CONTRAST  Result Date: 07/19/2021 CLINICAL DATA:  Clinical question of cardiomyopathy Study assumes HCT of 35 and BSA of 2.19 m2. EXAM: CARDIAC MRI TECHNIQUE: The patient was scanned on a 1.5 Tesla GE magnet. A dedicated cardiac coil was used. Functional imaging  was done using Fiesta sequences. 2,3, and 4 chamber views were done to assess for RWMA's. Modified Simpson's rule using a short axis stack was used to calculate an ejection fraction on a dedicated work Conservation officer, nature. The patient received 10 cc of Gadavist. After 10 minutes inversion recovery sequences were used to assess for infiltration and scar tissue. CONTRAST:  10 cc  of Gadavist FINDINGS: 1. Severely enlarged left ventricular size, with LVEDD 75 mm, and LVEDVi 176 mL/m2. Mild concentric increase in myocardial mass by indexed assessment, with intraventricular septal thickness of 8 mm, posterior wall thickness of 7 mm, and myocardial mass index of 94 g/m2. Severely reduced left ventricular systolic function (LVEF =88 %). There is global hypokinesis. Global longitudinal strain -4.4%. Strain curves and wall motion are suggestive of left bundle branch block morphology. Left ventricular parametric mapping notable for T2 elevation in the subendocardial basal inferior, mid inferior, inferoseptal, anteroseptal, and anterior, & apical lateral (seen on T2STIR). Diffusely elevated ECV signal (34-% to 44%) worst in basal septum 44%. There is late gadolinium enhancement in the left ventricular myocardium: Small mid myocardial stripe basal lateral into mid lateral. There is no LV thrombus. 2. Mildly dilated right ventricular size by volume with RVEDVI 93 mL/m2. Normal right ventricular thickness. Moderately reduced right ventricular systolic function (RVEF =50%). There is septal hypokinesis. 3.  Moderate dilated left atrium and right atrial size. 4. Normal size of the aortic root, ascending aorta and pulmonary artery. 5. Valve assessment: Aortic Valve: Tri-leaflet aortic valve with qualitatively trivial aortic regurgitation. Pulmonic Valve:qualitatively trivial regurgitation with prominent motion artifact. Tricuspid Valve: qualitatively trivial regurgitation. Mitral Valve: Trivial regurgitation. 6.  Normal  pericardium.  No pericardial effusion. 7. Grossly, no extracardiac findings. Recommended dedicated study if concerned for non-cardiac pathology IMPRESSION: Severely reduced LV function. Though modified [facility] Criteria for myocarditis is technically met, LGE and parametric mapping are not classic for this and clinical correlation is advised. Rudean Haskell MD Electronically Signed   By: Rudean Haskell M.D.   On: 07/19/2021 13:31     Medications:     Scheduled Medications:  aspirin EC  81 mg Oral Daily   atorvastatin  10 mg Oral Daily   digoxin  0.125 mg Oral Daily   empagliflozin  10 mg Oral Daily   enoxaparin (LOVENOX) injection  40 mg Subcutaneous Q24H   furosemide  40 mg Oral Daily   mouth rinse  15 mL Mouth Rinse BID   multivitamin with minerals  1 tablet Oral Daily   senna  1 tablet Oral QHS  sodium chloride flush  3 mL Intravenous Q12H   spironolactone  12.5 mg Oral Daily    Infusions:  sodium chloride      PRN Medications: sodium chloride, acetaminophen, guaiFENesin-dextromethorphan, ondansetron (ZOFRAN) IV, sodium chloride flush    Patient Profile   Lynn Estrada is a 61 year old with a history of HTN, GERD, nephrolithiasis, and partial right nephrectomy, admitted w/ acute heart failure and acute hypoxic respiratory failure.  Echo with dilated LV EF ~20-25% with prominent septal-lateral dyssynchrony in setting of LBBB (119m). RV normal.  Myoview EF 20% with ? Large anterior infarct vs breast attenuation.  [facility].   Assessment/Plan   Acute Systolic Heart Failure (New)  - Echo with dilated LV EF ~20-25% with prominent septal-lateral dyssynchrony in setting of LBBB (1258m. RV normal.  - Myoview EF 20% with ? Large anterior infarct vs breast attenuation.  - Cath 07/18/21 with minimal CAD. Mildly elevated filling pressures and normal CO - cMRI ? Myocarditis. LVEF 18% - Suspect LBBB may be playing a role but only  12877mNot candidate for CRT-D  - Continue digoxin 0.125 mg daily  - Continue Jardiance 10 mg daily  - Continue lasix 40 mg daily  - BP to soft for ARNi - Increase spiro to 25 mg daily  - no ? blocker yet - will c/w gradual GDMT titration in clinic as BP allows  2. Acute Hypoxic Respiratory Failure  - Oxygen saturations low on admit. Improved with oxygen and diuresis  - Saturations stable on RA    3. LBBB - Suspect LBBB may be playing a role but only 128m68m 4. H/O Partial Nephrectomy, nephrolithiasis - SCr stable, 0.90 today     Stable for d/c home today from CHF standpoint.  Cardiac Meds for Discharge  ASA 81 mg daily  Atorvastatin 10 mg daily  Digoxin 0.125 mg daily  Jardiance 10 mg daily  Lasix 40 mg daily  Spironolactone 25 mg daily   [facility] will place appt info in AVS   Length of Stay: 7  [id number]C  07/20/2021, 7:59 AM  Advanced Heart Failure Team Pager [phone]; 7a - 5p)  Please contact [email or web address] for night-coverage after hours (5p -7a ) and weekends on [email or web address]  Patient seen and examined with the above-signed Advanced Practice Provider and/or Housestaff. I personally reviewed laboratory data, imaging studies and relevant notes. I independently examined the patient and formulated the important aspects of the plan. I have edited the note to reflect any of my changes or salient points. I have personally discussed the plan with the patient and/or family.  She feels better. No orthopnea or PND.   General:  Well appearing. No resp difficulty HEENT: normal Neck: supple. no JVD. Carotids 2+ bilat; no bruits. No lymphadenopathy or thryomegaly appreciated. Cor: PMI nondisplaced. Regular rate & rhythm. No rubs, gallops or murmurs. Lungs: clear Abdomen: soft, nontender, nondistended. No hepatosplenomegaly. No bruits or masses. Good bowel sounds. Extremities: no cyanosis, clubbing, rash, edema Neuro: alert &  orientedx3, cranial nerves grossly intact. moves all 4 extremities w/o difficulty. Affect pleasant  cMRI reviewed with her. She has severe NICM. Suspect LBBB may be playing a role but QRS < 130 Lynn. Will focus on ver aggressive GDMT for next several months with close f/u in HF Clinic.   Reviewed meds with PharmD in detail.   [place] as above.  Total time spent 35 minutes. Over half that time spent discussing above.   Glori Bickers, MD  12:57 PM

## 2021-07-20 NOTE — TOC Progression Note (Signed)
Transition of Care [facility]) - Progression Note    Patient Details  Name: Lynn Estrada MRN: [id number] Date of Birth: 1961-03-31  Transition of [facility]) CM/SW Contact  Kaulin Chaves, LCSW Phone Number: 07/20/2021, 11:20 AM  Clinical Narrative:    HF CSW spoke with Ms. Lynn Estrada at bedside and completed a very breif SDOH with the patient who reported having some concerns about her employer and disability. Lynn Estrada reported that a [facility] application has been started and she is working with her employer for FMLA/disability. Lynn Estrada reported she does have a PCP, [name] with [facility] and she can get to the pharmacy to pick up her medications and [facility] [address].  CSW was able to schedule a hospital follow up with Lynn Estrada PCP per her request for Monday 07/25/21 at 11am this information can be found on the patient's AVS.CSW provided the patient with an appointment card for the [facility] outpatient clinic appointment and encouraged her to follow up and to attend the appointment and bring her medications and if anything changes to please reach out so that CSW/HV clinic team can provide support.  CSW will continue to follow throughout discharge.  Expected Discharge Plan: Home/Self Care Barriers to Discharge: No Barriers Identified  Expected Discharge Plan and Services Expected Discharge Plan: Home/Self Care In-house Referral: Clinical Social Work Discharge Planning Services: CM Consult Post Acute Care Choice: [place] arrangements for the past 2 months: Single Family Home                           HH Arranged: Disease Management [place]: [place] Date [place]: 07/13/21 Time [place]: 1352 Representative spoke with at [place]: [place] [facility]   Social Determinants of Health (SDOH) Interventions Food Insecurity Interventions: Intervention Not Indicated Financial Strain Interventions:  Intervention Not Indicated Housing Interventions: Intervention Not Indicated Transportation Interventions: Intervention Not Indicated  Readmission Risk Interventions No flowsheet data found.

## 2021-07-26 NOTE — Progress Notes (Signed)
PCP: [name] PA-C, [facility] Primary Cardiologist: Dr Terrence Dupont HF MD: Dr Haroldine Laws   HPI: Ms Mccormick is a 61 year old with a history of HTN, GERD, nephrolithiasis, partial right nephrectomy, LBBB, and recently diagnosed combine diastolic/systolic hf.      Admitted to [facility] 07/12/21 with progressive shortness of breath in the setting acute HFrEF. 07/13/21 Echo 20-25%, RV normal, grade II DD , and septal -lateral dyssnchrony. Cardiology consulted. Diuresed with IV lasix. Offered cath versus nuclear stress to help with etiology. She elected stress test. Stress test complicated by shortness of breath and was given IV lasix. Stress test abnormal. Transferred to [facility] for Advanced Heart Failure consultation. R/L cath w/ Minimal CAD, mildly elevated filling pressures and normal CO. cMRI ? Myocarditis (see impression below). LVEF 18%. Placed on jardiance and spiro. Not on bb with acute decompensation. Not on arni due to hypotension. Discharged on 07/20/21   Saw PCP on 07/25/21. Started on asa and atorvastatin.    Today she returns for HF follow up.Overall feeling fair. Complaining fatigue. SOB with exertion. + orthopnea. Denies PND. Appetite ok. Few days ago she had hot dog and chips. Tries to drink <2 liters. No fever or chills. Weight at home  200-203 pounds. Taking all medications.  Cardiac Studies  07/2021 R/L cath w/ Minimal CAD. EF 20%, Mildly elevated filling pressures and normal CO.  07/13/21 Echo 20-25%, RV normal, grade II DD , and septal -lateral dyssnchrony  07/2021 cMRI Myocarditis (see impression below). LVEF 18% . Although modified [facility] Criteria for myocarditis is technically met, LGE and parametric mapping are not class for this.   ROS: All systems negative except as listed in HPI, PMH and Problem List.  SH:  Social History   Socioeconomic History   Marital status: Married    Spouse name: Richard Diss   Number of children: 1   Years of education: college   Highest  education level: Not on file  Occupational History   Occupation: Higher education careers adviser  Tobacco Use   Smoking status: Former    Packs/day: 0.75    Years: 5.00    Pack years: 3.75    Types: Cigarettes    Quit date: 01/10/1997    Years since quitting: 24.5   Smokeless tobacco: Never  Vaping Use   Vaping Use: Never used  Substance and Sexual Activity   Alcohol use: Yes    Alcohol/week: 0.0 standard drinks    Comment: social use; once a month   Drug use: No   Sexual activity: Yes    Partners: Male    Birth control/protection: Surgical  Other Topics Concern   Not on file  Social History Narrative   Lives with her husband and their daughter and their dog.   Social Determinants of Health   Financial Resource Strain: Low Risk    Difficulty of Paying Living Expenses: Not very hard  Food Insecurity: No Food Insecurity   Worried About Charity fundraiser in the Last Year: Never true   Ran Out of Food in the Last Year: Never true  Transportation Needs: No Transportation Needs   Lack of Transportation (Medical): No   Lack of Transportation (Non-Medical): No  Physical Activity: Not on file  Stress: Not on file  Social Connections: Not on file  Intimate Partner Violence: Not on file    FH:  Family History  Problem Relation Age of Onset   Hypertension Sister    Eczema Sister    Lupus Sister  Sarcoidosis Mother    Prostate cancer Father    Gout Maternal Grandmother    Diabetes Maternal Grandmother        leg amputations   Breast cancer Maternal Aunt    Stomach cancer Neg Hx    Colon cancer Neg Hx     Past Medical History:  Diagnosis Date   Anemia    Eczema    GERD (gastroesophageal reflux disease)    History of adenomatous polyp of colon    08-03-2016  tubular adenoma x2 and hyperplastic   History of chronic gastritis 08/03/2016   History of ectopic pregnancy 1988   s/p  left salpingectomy   History of endometriosis    History of kidney stones    History of  partial nephrectomy    right pelvis for very large stone   History of pneumonia 07/02/2016   CAP   History of sepsis    07-01-2016  sepsis w/ pyelonephritis, CAP /   12-31-2016  urosepsis w/ kidney stone obstruction   History of uterine leiomyoma    Hypertension    Left ureteral stone    Nephrolithiasis    left obstructive stone and right non-obstructive stone  per CT 12-31-2016   Urgency of urination     Current Outpatient Medications  Medication Sig Dispense Refill   aspirin 81 MG EC tablet 81 mg daily.     atorvastatin (LIPITOR) 10 MG tablet Take 10 mg by mouth daily.     digoxin (LANOXIN) 0.125 MG tablet Take 1 tablet (0.125 mg total) by mouth daily. 30 tablet 2   empagliflozin (JARDIANCE) 10 MG TABS tablet Take 1 tablet (10 mg total) by mouth daily. 30 tablet 2   furosemide (LASIX) 40 MG tablet Take 1 tablet (40 mg total) by mouth daily. 30 tablet 2   Multiple Vitamins-Minerals (MULTIVITAMIN ADULT PO) Take 1 tablet by mouth daily.     spironolactone (ALDACTONE) 25 MG tablet Take 1 tablet (25 mg total) by mouth daily. 30 tablet 2   No current facility-administered medications for this encounter.    Vitals:   07/27/21 1028  BP: 139/80  Pulse: 85  SpO2: 94%  Weight: 91.6 kg   Wt Readings from Last 3 Encounters:  07/27/21 91.6 kg  07/20/21 93.3 kg  07/24/20 99.8 kg     PHYSICAL EXAM: General:  Dyspneic walking in the clinic.  HEENT: normal Neck: supple. JVP 11-12 . Carotids 2+ bilaterally; no bruits. No lymphadenopathy or thryomegaly appreciated. Cor: PMI normal. Regular rate & rhythm. No rubs, gallops or murmurs. Lungs: clear Abdomen: soft, nontender, distended. No hepatosplenomegaly. No bruits or masses. Good bowel sounds. Extremities: no cyanosis, clubbing, rash, edema Neuro: alert & orientedx3, cranial nerves grossly intact. Moves all 4 extremities w/o difficulty. Affect pleasant.   ECG: SR 82 bpm QRS 132 with occasional PVCs.    ASSESSMENT & PLAN: Chronic  Combined Systoic/Diastolic HF, NICM.  07/13/21 Echo 20-25%, RV normal, grade II DD , and septal -lateral dyssnchrony 07/2021 cMRI Myocarditis (see impression below). LVEF 18% . [facility] 07/2021 min CAD. CMRI EF < 20% possible myocarditis. RA 6 PCWP 21, CO 6.8/ CI 3.1  NYHA III. Reds Clip 47%. Volume status elevated suspect in the setting of high sodium diet. Discussed low salt food choices.  - Given 80 mg IV lasix + 20 meq K now. > 1 liter urine output.  - Tomorrow she increase home lasix regimen to 40 mg twice a day.  -Continue digoxin 0.125 mg daily  -  No bb with fatigue.  - Continue spironolactone daily  - Continue jardiance 10 mg daily - Consider entresto next visit.  - Check BMET and BNP today.   2. LBBB EKG today. LBBB QRS 132 ms.   3. Partial Nephrectomy , R  Will need to return to clinic next week to reassess volume status/Reds Clip. If no improvement may need to consider RHC to reassess hemodynamics.    Greater than 50% of the (total minutes 60) visit spent in counseling/coordination of care regarding the above.     Keydi Giel NP-C  10:39 AM

## 2021-07-27 ENCOUNTER — Ambulatory Visit (HOSPITAL_COMMUNITY)
Admission: RE | Admit: 2021-07-27 | Discharge: 2021-07-27 | Disposition: A | Discharge status: Home / Self Care | Payer: BC Managed Care – PPO | Source: Ambulatory Visit | Attending: Adult Health | Admitting: Adult Health

## 2021-07-27 ENCOUNTER — Other Ambulatory Visit: Payer: Self-pay

## 2021-07-27 VITALS — BP 139/80 | HR 85 | Wt 202.0 lb

## 2021-07-27 DIAGNOSIS — I447 Left bundle-branch block, unspecified: Secondary | ICD-10-CM | POA: Diagnosis not present

## 2021-07-27 DIAGNOSIS — I2510 Atherosclerotic heart disease of native coronary artery without angina pectoris: Secondary | ICD-10-CM | POA: Insufficient documentation

## 2021-07-27 DIAGNOSIS — K219 Gastro-esophageal reflux disease without esophagitis: Secondary | ICD-10-CM | POA: Diagnosis not present

## 2021-07-27 DIAGNOSIS — R0609 Other forms of dyspnea: Secondary | ICD-10-CM

## 2021-07-27 DIAGNOSIS — I428 Other cardiomyopathies: Secondary | ICD-10-CM | POA: Insufficient documentation

## 2021-07-27 DIAGNOSIS — Z87891 Personal history of nicotine dependence: Secondary | ICD-10-CM | POA: Insufficient documentation

## 2021-07-27 DIAGNOSIS — Z7984 Long term (current) use of oral hypoglycemic drugs: Secondary | ICD-10-CM | POA: Diagnosis not present

## 2021-07-27 DIAGNOSIS — I5042 Chronic combined systolic (congestive) and diastolic (congestive) heart failure: Secondary | ICD-10-CM

## 2021-07-27 DIAGNOSIS — Z79899 Other long term (current) drug therapy: Secondary | ICD-10-CM | POA: Insufficient documentation

## 2021-07-27 DIAGNOSIS — Z905 Acquired absence of kidney: Secondary | ICD-10-CM | POA: Diagnosis not present

## 2021-07-27 DIAGNOSIS — I110 Hypertensive heart disease with heart failure: Secondary | ICD-10-CM | POA: Diagnosis not present

## 2021-07-27 LAB — BASIC METABOLIC PANEL
Anion gap: 7 (ref 5–15)
BUN: 15 mg/dL (ref 6–20)
CO2: 24 mmol/L (ref 22–32)
Calcium: 8.9 mg/dL (ref 8.9–10.3)
Chloride: 109 mmol/L (ref 98–111)
Creatinine, Ser: 0.94 mg/dL (ref 0.44–1.00)
GFR, Estimated: >60 mL/min (ref >60)
Glucose, Bld: 97 mg/dL (ref 70–99)
Potassium: 3.9 mmol/L (ref 3.5–5.1)
Sodium: 140 mmol/L (ref 135–145)

## 2021-07-27 LAB — DIGOXIN LEVEL: Digoxin Level: 0.5 ng/mL — ABNORMAL LOW (ref 0.8–2.0)

## 2021-07-27 LAB — BRAIN NATRIURETIC PEPTIDE: B Natriuretic Peptide: 562.9 pg/mL — ABNORMAL HIGH (ref 0.0–100.0)

## 2021-07-27 MED ORDER — FUROSEMIDE 10 MG/ML IJ SOLN
80.0000 mg | Freq: Once | INTRAMUSCULAR | Status: AC
Start: 2021-07-27 — End: 2021-07-27
  Administered 2021-07-27: 80 mg via INTRAVENOUS

## 2021-07-27 MED ORDER — POTASSIUM CHLORIDE CRYS ER 20 MEQ PO TBCR
20.0000 meq | EXTENDED_RELEASE_TABLET | Freq: Once | ORAL | Status: AC
  Administered 2021-07-27: 20 meq via ORAL

## 2021-07-27 MED ORDER — FUROSEMIDE 40 MG PO TABS: 40.0000 mg | ORAL_TABLET | Freq: Two times a day (BID) | ORAL | 2 refills | Status: DC

## 2021-07-27 NOTE — Patient Instructions (Signed)
Medication Changes:  Start Lasix (Furosemide) 40mg  Twice daily    Lab Work:  Labs done today, your results will be available in MyChart, we will contact you for abnormal readings.   Testing/Procedures:  none  Referrals:  none  Special Instructions // Education:  none  Follow-Up in: 1 week  At the [facility], you and your health needs are our priority. We have a designated team specialized in the treatment of Heart Failure. This Care Team includes your primary Heart Failure Specialized Cardiologist (physician), Advanced Practice Providers (APPs- Physician Assistants and Nurse Practitioners), and Pharmacist who all work together to provide you with the care you need, when you need it.   You may see any of the following providers on your designated Care Team at your next follow up:  Dr Glori Bickers Dr Haynes Kerns, NP Lyda Jester, [place] [facility] [place], [place] Audry Riles, PharmD   Please be sure to bring in all your medications bottles to every appointment.   Need to Contact us:  If you have any questions or concerns before your next appointment please send [place] a message through [place] or call our office at [phone].    TO LEAVE A MESSAGE FOR THE NURSE SELECT OPTION 2, PLEASE LEAVE A MESSAGE INCLUDING: YOUR NAME DATE OF BIRTH CALL BACK NUMBER REASON FOR CALL**this is important as we prioritize the call backs  YOU WILL RECEIVE A CALL BACK THE SAME DAY AS LONG AS YOU CALL BEFORE 4:00 PM

## 2021-07-27 NOTE — Progress Notes (Signed)
80mg  of iv lasix  and 20Meq of Potassium given to patient. Output from the lasix was 925ml

## 2021-07-27 NOTE — Progress Notes (Signed)
ReDS Vest / Clip - 07/27/21 1028       ReDS Vest / Clip   Station Marker C    Ruler Value 30    ReDS Value Range High volume overload    ReDS Actual Value 47

## 2021-07-27 NOTE — Progress Notes (Signed)
Heart and Vascular Care Navigation  07/27/2021  Lynn Estrada [birth year] [id number]  Reason for Referral: Pt requested to speak with CSW regarding disability/insurance paperwork/financial concerns   Engaged with patient face to face for initial visit for Heart and Vascular Care Coordination.                                                                                                   Assessment:   Pt wanting to speak about above problems.  Pt is out of work due to medical condition and has talked to her HR department about STD and LTD- confirms she has these benefits and has initiated application process.  Pt is uncertain what is happening with this- encouraged her to reach out today and as frequently as needed to make sure she is doing everything she needs to and that they have sent appropriate paperwork out as this process can be delayed easily if things are getting done in timely fashion- pt expressed understanding.  Without her income she is relying on her spouses income- this has been difficult for them.  Reports that they have all bills covered for this month but worried about next month if she can't get STD started.  CSW informed pt of patient care fund and that we could potentially help with a bill or two if they cannot manage next month.    Pt also brought paperwork with her that needs to be filled out for FMLA- CSW reviewed alongside clinic staff who assists with this paperwork- pt instructed to complete all patient portions and bring back on Monday for physician to complete their portion and we would turn it in.   Pt reports she is somewhat concerned about getting enough food.  Used to have food stamps but reports its been a long time- encourage her to go to [facility] to re-apply.  Pt also supplied with list of local food pantries as she was unaware of these options.  Pt worried about getting healthy food with her condition- CSW supplied with $50 in gift cards to help obtain heart  health food options.                                  HRT/VAS Care Coordination     Living arrangements for the past 2 months Single Family Home   Lives with: Self; Spouse; Adult Children   Patient Current Librarian, academic   Patient Has Concern With Paying Medical Bills Yes   Medical Bill Referrals: Medicaid pending   Does Patient Have Prescription Coverage? Yes   Home Assistive Devices/Equipment Walker (specify type); [place]       Social History:  SDOH Screenings   Alcohol Screen: Not on file  Depression (PHQ2-9): Not on file  Financial Resource Strain: Medium Risk   Difficulty of Paying Living Expenses: Somewhat hard  Food Insecurity: Food Insecurity Present   Worried About Running Out of Food in the Last Year: Sometimes true   Ran Out of Food in the Last Year: Never true  Housing: Low Risk    Last Housing Risk Score: 0  Physical Activity: Not on file  Social Connections: Not on file  Stress: Not on file  Tobacco Use: Medium Risk   Smoking Tobacco Use: Former   Smokeless Tobacco Use: Never   Passive Exposure: Not on file  Transportation Needs: No Transportation Needs   Lack of Transportation (Medical): No   Lack of Transportation (Non-Medical): No    SDOH Interventions: Financial Resources:  Financial Strain Interventions: Other (Comment) (informed of patient card fund for possible future assistance) Already referred to [facility] for disability assistnace  Food Insecurity:  Food Insecurity Interventions: Other (Comment) (referral to food pantries, provided with $50 in gift cards, referred to apply with SNAP)  Housing Insecurity:   Worried about future bills if she does not get STD approved soon  Transportation:    No concerns reported     Follow-up plan:    Pt to bring back in FMLA  paperwork next week for completion  Pt to follow up with HR regarding her STD   Pt will reach out to [place] if they are unable to pay for bills next month and they need assistance.  Jorge Ny, LCSW Clinical Social Worker [facility] Desk#: [phone] Cell#: [phone]

## 2021-07-28 ENCOUNTER — Other Ambulatory Visit (HOSPITAL_COMMUNITY): Payer: Self-pay

## 2021-07-31 ENCOUNTER — Other Ambulatory Visit: Payer: Self-pay

## 2021-07-31 ENCOUNTER — Encounter (HOSPITAL_COMMUNITY): Payer: Self-pay

## 2021-07-31 ENCOUNTER — Ambulatory Visit (HOSPITAL_COMMUNITY)
Admission: RE | Admit: 2021-07-31 | Discharge: 2021-07-31 | Disposition: A | Discharge status: Home / Self Care | Payer: BC Managed Care – PPO | Source: Ambulatory Visit | Attending: Family Medicine | Admitting: Family Medicine

## 2021-07-31 ENCOUNTER — Other Ambulatory Visit (HOSPITAL_COMMUNITY): Payer: Self-pay

## 2021-07-31 VITALS — BP 120/78 | HR 97 | Wt 203.4 lb

## 2021-07-31 DIAGNOSIS — I5042 Chronic combined systolic (congestive) and diastolic (congestive) heart failure: Secondary | ICD-10-CM

## 2021-07-31 DIAGNOSIS — I2510 Atherosclerotic heart disease of native coronary artery without angina pectoris: Secondary | ICD-10-CM | POA: Diagnosis not present

## 2021-07-31 DIAGNOSIS — Z87891 Personal history of nicotine dependence: Secondary | ICD-10-CM | POA: Insufficient documentation

## 2021-07-31 DIAGNOSIS — Z79899 Other long term (current) drug therapy: Secondary | ICD-10-CM | POA: Insufficient documentation

## 2021-07-31 DIAGNOSIS — R0683 Snoring: Secondary | ICD-10-CM

## 2021-07-31 DIAGNOSIS — I428 Other cardiomyopathies: Secondary | ICD-10-CM | POA: Insufficient documentation

## 2021-07-31 DIAGNOSIS — I110 Hypertensive heart disease with heart failure: Secondary | ICD-10-CM | POA: Insufficient documentation

## 2021-07-31 DIAGNOSIS — I447 Left bundle-branch block, unspecified: Secondary | ICD-10-CM | POA: Diagnosis not present

## 2021-07-31 DIAGNOSIS — Z905 Acquired absence of kidney: Secondary | ICD-10-CM

## 2021-07-31 LAB — BASIC METABOLIC PANEL
Anion gap: 10 (ref 5–15)
BUN: 18 mg/dL (ref 6–20)
CO2: 28 mmol/L (ref 22–32)
Calcium: 9.6 mg/dL (ref 8.9–10.3)
Chloride: 103 mmol/L (ref 98–111)
Creatinine, Ser: 1.1 mg/dL — ABNORMAL HIGH (ref 0.44–1.00)
GFR, Estimated: 58 mL/min — ABNORMAL LOW (ref >60)
Glucose, Bld: 139 mg/dL — ABNORMAL HIGH (ref 70–99)
Potassium: 3.6 mmol/L (ref 3.5–5.1)
Sodium: 141 mmol/L (ref 135–145)

## 2021-07-31 LAB — BRAIN NATRIURETIC PEPTIDE: B Natriuretic Peptide: 258.5 pg/mL — ABNORMAL HIGH (ref 0.0–100.0)

## 2021-07-31 MED ORDER — ENTRESTO 24-26 MG PO TABS: 1.0000 | ORAL_TABLET | Freq: Two times a day (BID) | ORAL | 11 refills | Status: DC

## 2021-07-31 NOTE — Patient Instructions (Signed)
Medication Changes:  Start Entresto 24/26 Twice daily   Lab Work:  Labs done today, your results will be available in MyChart, we will contact you for abnormal readings.   Testing/Procedures:  Your physician has requested that you have an echocardiogram. Echocardiography is a painless test that uses sound waves to create images of your heart. It provides your doctor with information about the size and shape of your heart and how well your hearts chambers and valves are working. This procedure takes approximately one hour. There are no restrictions for this procedure.   Referrals:  none  Special Instructions // Education:  none  Follow-Up in: 4wks with clinic, then in 3 months with Dr. Haroldine Laws  At the [facility], you and your health needs are our priority. We have a designated team specialized in the treatment of Heart Failure. This Care Team includes your primary Heart Failure Specialized Cardiologist (physician), Advanced Practice Providers (APPs- Physician Assistants and Nurse Practitioners), and Pharmacist who all work together to provide you with the care you need, when you need it.   You may see any of the following providers on your designated Care Team at your next follow up:  Dr Glori Bickers Dr Haynes Kerns, NP Lyda Jester, [place] [facility] [place], [place] Audry Riles, PharmD   Please be sure to bring in all your medications bottles to every appointment.   Need to Contact us:  If you have any questions or concerns before your next appointment please send [place] a message through [place] or call our office at [phone].    TO LEAVE A MESSAGE FOR THE NURSE SELECT OPTION 2, PLEASE LEAVE A MESSAGE INCLUDING: YOUR NAME DATE OF BIRTH CALL BACK NUMBER REASON FOR CALL**this is important as we prioritize the call backs  YOU WILL RECEIVE A CALL BACK THE SAME DAY AS LONG AS YOU CALL BEFORE 4:00 PM

## 2021-07-31 NOTE — Progress Notes (Signed)
ReDS Vest / Clip - 07/31/21 1500       ReDS Vest / Clip   Station Marker C    Ruler Value 27    ReDS Value Range Low volume    ReDS Actual Value 33

## 2021-07-31 NOTE — Progress Notes (Signed)
PCP: Wynne Dust, [facility] Primary Cardiologist: Dr Terrence Dupont HF MD: Dr Haroldine Laws   HPI: [name] is a 61 y.o.with a history of HTN, GERD, nephrolithiasis, partial right nephrectomy, LBBB, and recently diagnosed combine diastolic/systolic HF.      Admitted to [facility] 07/12/21 with progressive shortness of breath in the setting acute HFrEF. 07/13/21 Echo 20-25%, RV normal, grade II DD , and septal - lateral dyssnchrony. Cardiology consulted. Diuresed with IV lasix. Offered cath versus nuclear stress to help with etiology. She elected stress test. Stress test complicated by shortness of breath and was given IV lasix. Stress test abnormal. Transferred to [name] for Advanced Heart Failure consultation. R/L cath w/ Minimal CAD, mildly elevated filling pressures and normal CO. cMRI ? Myocarditis (see impression below). LVEF 18%. Placed on Jardiance and spiro. Not on bb with acute decompensation. No ARNi with hypotension. Discharged on 07/20/21.   Saw PCP on 07/25/21. Started on asa and atorvastatin.   Received Lasix 80 IV in clinic, ReDs 47%.  Today she returns for HF follow up. She feels "woozy" and remains SOB with ADLs. Does OK walking on flat ground short distances. Denies CP, palpitations, abnormal bleeding, edema, or PND/Orthopnea. Appetite ok. No fever or chills. Weight at home 199 pounds. Taking all medications. Husband says she snores. Has had COVID twice.  Cardiac Studies  07/2021 R/L cath w/ Minimal CAD. EF 20%, Mildly elevated filling pressures and normal CO.  07/13/21 Echo 20-25%, RV normal, grade II DD , and septal -lateral dyssnchrony  07/2021 cMRI Myocarditis (see impression below). LVEF 18% . Although modified [facility] Criteria for myocarditis is technically met, LGE and parametric mapping are not class for this.   ROS: All systems negative except as listed in HPI, PMH and Problem List.  SH:  Social History   Socioeconomic History   Marital status: Married    Spouse name:  Lynn Estrada   Number of children: 1   Years of education: college   Highest education level: Not on file  Occupational History   Occupation: Higher education careers adviser  Tobacco Use   Smoking status: Former    Packs/day: 0.75    Years: 5.00    Pack years: 3.75    Types: Cigarettes    Quit date: 01/10/1997    Years since quitting: 24.5   Smokeless tobacco: Never  Vaping Use   Vaping Use: Never used  Substance and Sexual Activity   Alcohol use: Yes    Alcohol/week: 0.0 standard drinks    Comment: social use; once a month   Drug use: No   Sexual activity: Yes    Partners: Male    Birth control/protection: Surgical  Other Topics Concern   Not on file  Social History Narrative   Lives with her husband and their daughter and their dog.   Social Determinants of Health   Financial Resource Strain: Medium Risk   Difficulty of Paying Living Expenses: Somewhat hard  Food Insecurity: Food Insecurity Present   Worried About Running Out of Food in the Last Year: Sometimes true   Ran Out of Food in the Last Year: Never true  Transportation Needs: No Transportation Needs   Lack of Transportation (Medical): No   Lack of Transportation (Non-Medical): No  Physical Activity: Not on file  Stress: Not on file  Social Connections: Not on file  Intimate Partner Violence: Not on file   FH:  Family History  Problem Relation Age of Onset   Hypertension Sister  Eczema Sister    Lupus Sister    Sarcoidosis Mother    Prostate cancer Father    Gout Maternal Grandmother    Diabetes Maternal Grandmother        leg amputations   Breast cancer Maternal Aunt    Stomach cancer Neg Hx    Colon cancer Neg Hx    Past Medical History:  Diagnosis Date   Anemia    Eczema    GERD (gastroesophageal reflux disease)    History of adenomatous polyp of colon    08-03-2016  tubular adenoma x2 and hyperplastic   History of chronic gastritis 08/03/2016   History of ectopic pregnancy 1988   s/p  left  salpingectomy   History of endometriosis    History of kidney stones    History of partial nephrectomy    right pelvis for very large stone   History of pneumonia 07/02/2016   CAP   History of sepsis    07-01-2016  sepsis w/ pyelonephritis, CAP /   12-31-2016  urosepsis w/ kidney stone obstruction   History of uterine leiomyoma    Hypertension    Left ureteral stone    Nephrolithiasis    left obstructive stone and right non-obstructive stone  per CT 12-31-2016   Urgency of urination    Current Outpatient Medications  Medication Sig Dispense Refill   aspirin 81 MG EC tablet 81 mg daily.     atorvastatin (LIPITOR) 10 MG tablet Take 10 mg by mouth daily.     digoxin (LANOXIN) 0.125 MG tablet Take 1 tablet (0.125 mg total) by mouth daily. 30 tablet 2   empagliflozin (JARDIANCE) 10 MG TABS tablet Take 1 tablet (10 mg total) by mouth daily. 30 tablet 2   furosemide (LASIX) 40 MG tablet Take 1 tablet (40 mg total) by mouth 2 (two) times daily. 30 tablet 2   Multiple Vitamins-Minerals (MULTIVITAMIN ADULT PO) Take 1 tablet by mouth daily.     spironolactone (ALDACTONE) 25 MG tablet Take 1 tablet (25 mg total) by mouth daily. 30 tablet 2   No current facility-administered medications for this encounter.   BP 120/78    Pulse 97    Wt 92.3 kg (203 lb 6.4 oz)    SpO2 95%    BMI 26.84 kg/m   Wt Readings from Last 3 Encounters:  07/31/21 92.3 kg (203 lb 6.4 oz)  07/27/21 91.6 kg (202 lb)  07/20/21 93.3 kg (205 lb 9.6 oz)   PHYSICAL EXAM: General:  NAD. No resp difficulty HEENT: Normal Neck: Supple. No JVD. Carotids 2+ bilat; no bruits. No lymphadenopathy or thryomegaly appreciated. Cor: PMI nondisplaced. Regular rate & rhythm. No rubs, gallops or murmurs. Lungs: Clear Abdomen: Soft, nontender, nondistended. No hepatosplenomegaly. No bruits or masses. Good bowel sounds. Extremities: No cyanosis, clubbing, rash, edema Neuro: Alert & oriented x 3, cranial nerves grossly intact. Moves all 4  extremities w/o difficulty. Affect pleasant.  ReDs: 33%  ASSESSMENT & PLAN: Chronic Combined Systoic/Diastolic HF, NICM.  - 07/13/21 Echo 20-25%, RV normal, grade II DD , and septal -lateral dyssnchrony - 07/2021 cMRI Myocarditis  LVEF 18% . - [facility] 07/2021 min CAD. CMRI EF < 20% possible myocarditis. RA 6 PCWP 21, CO 6.8/ CI 3.1  - NYHA II-early III. Reds Clip 33%. Volume looks good today. - Start Entresto 24/26 mg bid. Given co-pay card. - Continue Lasix 40 mg bid for now. - Continue digoxin 0.125 mg daily. Dig 0.5 07/27/21 - Continue spironolactone daily  -  Continue Jardiance 10 mg daily - Add beta blocker next. - BMET and BNP today; BMET in 10-14 days.   2. LBBB - QRS 132 ms. On recent ECG.   3. Partial Nephrectomy , R - BMET today.  4. Snoring/daytime fatigue - We discussed sleep study. She declined.  Follow up with APP in 3-4 weeks (add beta blocker and increase Entresto if able) and in 12 weeks with Dr. Haroldine Laws + echo.  Rafael Bihari FNP 3:49 PM

## 2021-08-04 ENCOUNTER — Other Ambulatory Visit (HOSPITAL_COMMUNITY): Payer: Self-pay | Admitting: Adult Health

## 2021-08-07 ENCOUNTER — Other Ambulatory Visit (HOSPITAL_COMMUNITY): Payer: Self-pay

## 2021-08-08 ENCOUNTER — Telehealth (HOSPITAL_COMMUNITY): Payer: Self-pay | Admitting: Licensed Clinical Social Worker

## 2021-08-08 ENCOUNTER — Telehealth (HOSPITAL_COMMUNITY): Payer: Self-pay | Admitting: *Deleted

## 2021-08-08 NOTE — Telephone Encounter (Signed)
Pt left VM stating she is feeling lightheaded and dizzy at times, wants to know if there is something she can do for this.  At [facility] 1/30 pt was started on Entresto 24/26, attempted to call pt to f/u and ask about BP but had to left message for her to call us back

## 2021-08-08 NOTE — Telephone Encounter (Signed)
CSW received call from pt requesting help with her current mortgage payment.  Reports they owe $1,100 and that they are unable to afford at this time since pt does not have a source of income.  CSW informed pt of required documents- pt will gather and get back with CSW to provide.  Jorge Ny, LCSW Clinical Social Worker [facility] Desk#: [phone] Cell#: [phone]

## 2021-08-10 ENCOUNTER — Other Ambulatory Visit: Payer: Self-pay

## 2021-08-10 ENCOUNTER — Ambulatory Visit (HOSPITAL_COMMUNITY)
Admission: RE | Admit: 2021-08-10 | Discharge: 2021-08-10 | Disposition: A | Discharge status: Home / Self Care | Payer: BC Managed Care – PPO | Source: Ambulatory Visit | Attending: Cardiology | Admitting: Cardiology

## 2021-08-10 DIAGNOSIS — I5042 Chronic combined systolic (congestive) and diastolic (congestive) heart failure: Secondary | ICD-10-CM | POA: Diagnosis present

## 2021-08-10 LAB — BASIC METABOLIC PANEL
Anion gap: 9 (ref 5–15)
BUN: 14 mg/dL (ref 6–20)
CO2: 26 mmol/L (ref 22–32)
Calcium: 9.1 mg/dL (ref 8.9–10.3)
Chloride: 106 mmol/L (ref 98–111)
Creatinine, Ser: 1.14 mg/dL — ABNORMAL HIGH (ref 0.44–1.00)
GFR, Estimated: 55 mL/min — ABNORMAL LOW (ref >60)
Glucose, Bld: 129 mg/dL — ABNORMAL HIGH (ref 70–99)
Potassium: 4.1 mmol/L (ref 3.5–5.1)
Sodium: 141 mmol/L (ref 135–145)

## 2021-08-10 NOTE — Progress Notes (Signed)
Heart and Vascular Care Navigation  08/10/2021  Era Parr Sand Sep 20, 1960 [id number]  Reason for Referral: Pt requesting to see CSW regarding disability placard, FMLA paperwork, and mortgage payment assistance.   Engaged with patient face to face for follow up visit for Heart and Vascular Care Coordination.                                                                                                   Assessment:     Pt provided disability placard paperwork- CSW assisted in getting physician to sign and returned to pt.  Pt also inquired about status of FMLA paperwork- CSW confirmed we have paperwork in disability folder awaiting completion. This will also be for STD benefits.  Pt then inquired about patient care fund to assist with mortgage payment.  Provided proof of income and invoice of what is owed for mortgage.  CSW [employer] w9 form for completion.                        HRT/VAS Care Coordination     Living arrangements for the past 2 months Single Family Home   Lives with: Self; Spouse; Adult Children   Patient Current Librarian, academic   Patient Has Concern With Paying Medical Bills Yes   Medical Bill Referrals: Medicaid pending   Does Patient Have Prescription Coverage? Yes   Home Assistive Devices/Equipment Walker (specify type); [place]       Social History:                                                                             SDOH Screenings   Alcohol Screen: Not on file  Depression (PHQ2-9): Not on file  Financial Resource Strain: Medium Risk   Difficulty of Paying Living Expenses: Somewhat hard  Food Insecurity: Food Insecurity Present   Worried About Running Out of Food in the Last Year: Sometimes true   Ran Out of Food in the Last Year: Never true  Housing: Low Risk    Last Housing Risk Score: 0  Physical Activity: Not on file  Social  Connections: Not on file  Stress: Not on file  Tobacco Use: Medium Risk   Smoking Tobacco Use: Former   Smokeless Tobacco Use: Never   Passive Exposure: Not on file  Transportation Needs: No Transportation Needs   Lack of Transportation (Medical): No   Lack of Transportation (Non-Medical): No    Follow-up plan:    CSW will continue to follow pt and await w-9 from [place] to move forward with check request.  Will continue to follow and assist as needed  Jorge Ny, LCSW Clinical Social Worker Advanced Heart Failure  Clinic Desk#: [phone] Cell#: [phone]

## 2021-08-10 NOTE — Progress Notes (Signed)
During lab appt pt reports increased dizziness. Reports she has been playing phone tag with triage.  Weight stable at 202 C/o increased dizziness  x 1 week Lightheaded started to use cane to steady herself for the past 3 days  Lasix was taken incorrectly written for 40 bid, however she has been taking it TID) x 10 days    B/p sitting 124/86 B/p standing 138/88   Advised of possible over diuresing-  medication adjustments will be made based on labs results. In the mean time take it easy for the day.Please leave detailed message for patient when returning call.

## 2021-08-14 ENCOUNTER — Telehealth (HOSPITAL_COMMUNITY): Payer: Self-pay

## 2021-08-14 MED ORDER — FUROSEMIDE 40 MG PO TABS: 40.0000 mg | ORAL_TABLET | Freq: Every day | ORAL | 2 refills | Status: DC

## 2021-08-14 NOTE — Telephone Encounter (Addendum)
Pt aware, agreeable, and verbalized understanding   Script updated to reflect this   ----- Message from Rafael Bihari, FNP sent at 08/11/2021  8:30 AM EST ----- Labs ok, but note from lab appt looks like she was taking lasix tid. Increased dizziness likely due to this.   OK to decrease Lasix to 40 mg daily.

## 2021-08-17 ENCOUNTER — Telehealth (HOSPITAL_COMMUNITY): Payer: Self-pay | Admitting: Licensed Clinical Social Worker

## 2021-08-17 ENCOUNTER — Other Ambulatory Visit (HOSPITAL_COMMUNITY): Payer: Self-pay

## 2021-08-17 ENCOUNTER — Telehealth (HOSPITAL_COMMUNITY): Payer: Self-pay

## 2021-08-17 NOTE — Telephone Encounter (Signed)
Remaining paperwork for patient care fund request to assist with outstanding mortgage payment brought in to clinic by pt.  CSW submitted request to CSW lead for review and submission  Jorge Ny, [facility] Desk#: [phone] Cell#: [phone]

## 2021-08-17 NOTE — Telephone Encounter (Signed)
Was wondering about disability paper work. Informed her that it will be signed today and sent in.

## 2021-08-21 ENCOUNTER — Telehealth (HOSPITAL_COMMUNITY): Payer: Self-pay

## 2021-08-21 NOTE — Telephone Encounter (Signed)
All forms faxed on 08/21/2021

## 2021-08-23 NOTE — Progress Notes (Signed)
PCP: Wynne Dust, [facility] Primary Cardiologist: Dr Terrence Dupont HF MD: Dr Haroldine Laws   HPI: Ms Thien is a 61 y.o.with a history of HTN, GERD, nephrolithiasis, partial right nephrectomy, LBBB, and recently diagnosed combine diastolic/systolic HF.      Admitted to [facility] 07/12/21 with progressive shortness of breath in the setting acute HFrEF. 07/13/21 Echo 20-25%, RV normal, grade II DD , and septal - lateral dyssnchrony. Cardiology consulted. Diuresed with IV lasix. Offered cath versus nuclear stress to help with etiology. She elected stress test. Stress test complicated by shortness of breath and was given IV lasix. Stress test abnormal. Transferred to [facility] for Advanced Heart Failure consultation. R/L cath w/ Minimal CAD, mildly elevated filling pressures and normal CO. cMRI ? Myocarditis (see impression below). LVEF 18%. Placed on Jardiance and spiro. Not on bb with acute decompensation. No ARNi with hypotension. Discharged on 07/20/21.   Saw PCP on 07/25/21. Started on asa and atorvastatin.   Received Lasix 80 IV in clinic, ReDs 47% 1/23.  Today she returns for HF follow up. She remains fatigued with housework and is SOB with ADLs if she "does too much."  Denies abnormal bleeding, palpitations, CP, dizziness, edema, or PND/Orthopnea. Appetite ok. No fever or chills. Weight at home 204-205 pounds. Taking all medications. Asking if she can return to her part time job.  Cardiac Studies  07/2021 R/L cath w/ Minimal CAD. EF 20%, Mildly elevated filling pressures and normal CO.  07/13/21 Echo 20-25%, RV normal, grade II DD , and septal -lateral dyssnchrony  07/2021 cMRI Myocarditis (see impression below). LVEF 18% . Although modified [facility] Criteria for myocarditis is technically met, LGE and parametric mapping are not class for this.   ROS: All systems negative except as listed in HPI, PMH and Problem List.  SH:  Social History   Socioeconomic History   Marital status: Married     Spouse name: Richard Woehl   Number of children: 1   Years of education: college   Highest education level: Not on file  Occupational History   Occupation: Higher education careers adviser  Tobacco Use   Smoking status: Former    Packs/day: 0.75    Years: 5.00    Pack years: 3.75    Types: Cigarettes    Quit date: 01/10/1997    Years since quitting: 24.6   Smokeless tobacco: Never  Vaping Use   Vaping Use: Never used  Substance and Sexual Activity   Alcohol use: Yes    Alcohol/week: 0.0 standard drinks    Comment: social use; once a month   Drug use: No   Sexual activity: Yes    Partners: Male    Birth control/protection: Surgical  Other Topics Concern   Not on file  Social History Narrative   Lives with her husband and their daughter and their dog.   Social Determinants of Health   Financial Resource Strain: Medium Risk   Difficulty of Paying Living Expenses: Somewhat hard  Food Insecurity: Food Insecurity Present   Worried About Running Out of Food in the Last Year: Sometimes true   Ran Out of Food in the Last Year: Never true  Transportation Needs: No Transportation Needs   Lack of Transportation (Medical): No   Lack of Transportation (Non-Medical): No  Physical Activity: Not on file  Stress: Not on file  Social Connections: Not on file  Intimate Partner Violence: Not on file   FH:  Family History  Problem Relation Age of Onset   Hypertension  Sister    Eczema Sister    Lupus Sister    Sarcoidosis Mother    Prostate cancer Father    Gout Maternal Grandmother    Diabetes Maternal Grandmother        leg amputations   Breast cancer Maternal Aunt    Stomach cancer Neg Hx    Colon cancer Neg Hx    Past Medical History:  Diagnosis Date   Anemia    Eczema    GERD (gastroesophageal reflux disease)    History of adenomatous polyp of colon    08-03-2016  tubular adenoma x2 and hyperplastic   History of chronic gastritis 08/03/2016   History of ectopic pregnancy 1988    s/p  left salpingectomy   History of endometriosis    History of kidney stones    History of partial nephrectomy    right pelvis for very large stone   History of pneumonia 07/02/2016   CAP   History of sepsis    07-01-2016  sepsis w/ pyelonephritis, CAP /   12-31-2016  urosepsis w/ kidney stone obstruction   History of uterine leiomyoma    Hypertension    Left ureteral stone    Nephrolithiasis    left obstructive stone and right non-obstructive stone  per CT 12-31-2016   Urgency of urination    Current Outpatient Medications  Medication Sig Dispense Refill   aspirin 81 MG EC tablet 81 mg daily.     atorvastatin (LIPITOR) 10 MG tablet Take 10 mg by mouth daily.     digoxin (LANOXIN) 0.125 MG tablet Take 1 tablet (0.125 mg total) by mouth daily. 30 tablet 2   empagliflozin (JARDIANCE) 10 MG TABS tablet Take 1 tablet (10 mg total) by mouth daily. 30 tablet 2   furosemide (LASIX) 40 MG tablet Take 1 tablet (40 mg total) by mouth daily. 30 tablet 2   Multiple Vitamins-Minerals (MULTIVITAMIN ADULT PO) Take 1 tablet by mouth daily.     sacubitril-valsartan (ENTRESTO) 24-26 MG Take 1 tablet by mouth 2 (two) times daily. 60 tablet 11   spironolactone (ALDACTONE) 25 MG tablet Take 1 tablet (25 mg total) by mouth daily. 30 tablet 2   No current facility-administered medications for this encounter.   BP 96/60    Pulse 72    Wt 93.4 kg (206 lb)    SpO2 97%    BMI 27.18 kg/m   Wt Readings from Last 3 Encounters:  08/28/21 93.4 kg (206 lb)  07/31/21 92.3 kg (203 lb 6.4 oz)  07/27/21 91.6 kg (202 lb)   PHYSICAL EXAM: General:  NAD. No resp difficulty HEENT: Normal Neck: Supple. No JVD. Carotids 2+ bilat; no bruits. No lymphadenopathy or thryomegaly appreciated. Cor: PMI nondisplaced. Regular rate & rhythm. No rubs, gallops or murmurs. Lungs: Clear Abdomen: Soft, nontender, nondistended. No hepatosplenomegaly. No bruits or masses. Good bowel sounds. Extremities: No cyanosis, clubbing,  rash, edema Neuro: Alert & oriented x 3, cranial nerves grossly intact. Moves all 4 extremities w/o difficulty. Affect pleasant.  ASSESSMENT & PLAN: Chronic Combined Systoic/Diastolic HF, NICM.  - 07/13/21 Echo 20-25%, RV normal, grade II DD , and septal -lateral dyssnchrony - 07/2021 cMRI possible myocarditis  LVEF 18% RVEF 33% - [facility] 07/2021 min CAD. RA 6 PCWP 21, CO 6.8/ CI 3.1  - NYHA II-early III. Volume looks good today. - Continue Entresto 24/26 mg bid.  - Continue Lasix 40 mg daily. - Continue digoxin 0.125 mg daily.  - Continue spironolactone 25 mg daily  -  Continue Jardiance 10 mg daily. Given co-pay card. - Add beta blocker next. - BMET and dig level today.  2. HTN - BP on the low side today, limiting GDMT. - Given Rx for BP cuff, check BP daily and bring to next visit. - May need to switch Entresto to losartan.  3. LBBB - QRS 132 ms. On recent ECG.   4. Partial Nephrectomy , R - BMET today.  5. Snoring/daytime fatigue - We discussed sleep study. She declined.  6. 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 I do not think she can return to this job yet. I gave her a note to remain out of work until follow up with Dr. Haroldine Laws. - She works part-time at [employer], and she would like to return soon (works 3-4 hours/day, 3 days a week, mostly sits). I think it is OK for her to go back to this job with allowances for frequent rest breaks, or time off if needed. Not provided.  Follow up with PharmD in 2-3 weeks (add beta blocker if able, if sBP < 100, would stop Entresto and start losartan 12.5). Keep follow up with Dr. Haroldine Laws + echo.   Rafael Bihari FNP 10:54 AM

## 2021-08-24 ENCOUNTER — Encounter (HOSPITAL_COMMUNITY): Payer: Self-pay | Admitting: *Deleted

## 2021-08-24 NOTE — Telephone Encounter (Signed)
Disability forms faxed to [place] forms faxed to [place] message sent to pt, copy left at front desk for her to pick up

## 2021-08-25 ENCOUNTER — Other Ambulatory Visit (HOSPITAL_COMMUNITY): Payer: Self-pay | Admitting: Adult Health

## 2021-08-28 ENCOUNTER — Ambulatory Visit (HOSPITAL_COMMUNITY)
Admission: RE | Admit: 2021-08-28 | Discharge: 2021-08-28 | Disposition: A | Discharge status: Home / Self Care | Payer: BC Managed Care – PPO | Source: Ambulatory Visit | Attending: Family Medicine | Admitting: Family Medicine

## 2021-08-28 ENCOUNTER — Telehealth (HOSPITAL_COMMUNITY): Payer: Self-pay | Admitting: Licensed Clinical Social Worker

## 2021-08-28 ENCOUNTER — Other Ambulatory Visit: Payer: Self-pay

## 2021-08-28 ENCOUNTER — Encounter (HOSPITAL_COMMUNITY): Payer: Self-pay

## 2021-08-28 ENCOUNTER — Other Ambulatory Visit (HOSPITAL_COMMUNITY): Payer: Self-pay

## 2021-08-28 VITALS — BP 96/60 | HR 72 | Wt 206.0 lb

## 2021-08-28 DIAGNOSIS — I447 Left bundle-branch block, unspecified: Secondary | ICD-10-CM | POA: Insufficient documentation

## 2021-08-28 DIAGNOSIS — I428 Other cardiomyopathies: Secondary | ICD-10-CM | POA: Diagnosis not present

## 2021-08-28 DIAGNOSIS — Z7984 Long term (current) use of oral hypoglycemic drugs: Secondary | ICD-10-CM | POA: Diagnosis not present

## 2021-08-28 DIAGNOSIS — I5042 Chronic combined systolic (congestive) and diastolic (congestive) heart failure: Secondary | ICD-10-CM | POA: Insufficient documentation

## 2021-08-28 DIAGNOSIS — Z8759 Personal history of other complications of pregnancy, childbirth and the puerperium: Secondary | ICD-10-CM | POA: Diagnosis not present

## 2021-08-28 DIAGNOSIS — Z905 Acquired absence of kidney: Secondary | ICD-10-CM | POA: Insufficient documentation

## 2021-08-28 DIAGNOSIS — K219 Gastro-esophageal reflux disease without esophagitis: Secondary | ICD-10-CM | POA: Diagnosis not present

## 2021-08-28 DIAGNOSIS — R5383 Other fatigue: Secondary | ICD-10-CM

## 2021-08-28 DIAGNOSIS — I10 Essential (primary) hypertension: Secondary | ICD-10-CM | POA: Diagnosis not present

## 2021-08-28 DIAGNOSIS — Z79899 Other long term (current) drug therapy: Secondary | ICD-10-CM | POA: Diagnosis not present

## 2021-08-28 DIAGNOSIS — I110 Hypertensive heart disease with heart failure: Secondary | ICD-10-CM | POA: Diagnosis not present

## 2021-08-28 DIAGNOSIS — R0683 Snoring: Secondary | ICD-10-CM | POA: Diagnosis not present

## 2021-08-28 LAB — BASIC METABOLIC PANEL
Anion gap: 9 (ref 5–15)
BUN: 11 mg/dL (ref 6–20)
CO2: 25 mmol/L (ref 22–32)
Calcium: 9.1 mg/dL (ref 8.9–10.3)
Chloride: 107 mmol/L (ref 98–111)
Creatinine, Ser: 0.9 mg/dL (ref 0.44–1.00)
GFR, Estimated: >60 mL/min (ref >60)
Glucose, Bld: 118 mg/dL — ABNORMAL HIGH (ref 70–99)
Potassium: 4.1 mmol/L (ref 3.5–5.1)
Sodium: 141 mmol/L (ref 135–145)

## 2021-08-28 LAB — DIGOXIN LEVEL: Digoxin Level: 0.4 ng/mL — ABNORMAL LOW (ref 0.8–2.0)

## 2021-08-28 NOTE — Patient Instructions (Signed)
It was great to see you today! No medication changes are needed at this time.  Labs today We will only contact you if something comes back abnormal or we need to make some changes. Otherwise no news is good news!  Your physician recommends that you schedule a follow-up appointment in: 3 weeks with the pharmacy team  Keep followup with Dr Haroldine Laws as scheduled  Your physician has requested that you regularly monitor and record your blood pressure readings at home. Please use the same machine at the same time of day to check your readings and record them to bring to your follow-up visit. -please bring readings to followup appointment  Do the following things EVERYDAY: Weigh yourself in the morning before breakfast. Write it down and keep it in a log. Take your medicines as prescribed Eat low salt foods--Limit salt (sodium) to 2000 mg per day.  Stay as active as you can everyday Limit all fluids for the day to less than 2 liters  At the [facility], you and your health needs are our priority. As part of our continuing mission to provide you with exceptional heart care, we have created designated Provider Care Teams. These Care Teams include your primary Cardiologist (physician) and Advanced Practice Providers (APPs- Physician Assistants and Nurse Practitioners) who all work together to provide you with the care you need, when you need it.   You may see any of the following providers on your designated Care Team at your next follow up: Dr Glori Bickers Dr Haynes Kerns, NP Lyda Jester, [place] [facility] [place], [place] Audry Riles, PharmD   Please be sure to bring in all your medications bottles to every appointment.   If you have any questions or concerns before your next appointment please send [place] a message through [place] or call our office at [phone].    TO LEAVE A MESSAGE FOR THE NURSE SELECT OPTION 2, PLEASE LEAVE A MESSAGE  INCLUDING: YOUR NAME DATE OF BIRTH CALL BACK NUMBER REASON FOR CALL**this is important as we prioritize the call backs  YOU WILL RECEIVE A CALL BACK THE SAME DAY AS LONG AS YOU CALL BEFORE 4:00 PM

## 2021-08-28 NOTE — Telephone Encounter (Signed)
CSW consulted to help get pt a BP cuff- able to order for pt and have shipped to her house- anticipated delivery Wednesday.  Jorge Ny, LCSW Clinical Social Worker Advanced Heart Failure Clinic Desk#: [phone] Cell#: [phone]

## 2021-09-11 ENCOUNTER — Other Ambulatory Visit (HOSPITAL_COMMUNITY): Payer: Self-pay | Admitting: Adult Health

## 2021-09-13 ENCOUNTER — Telehealth (HOSPITAL_COMMUNITY): Payer: Self-pay | Admitting: *Deleted

## 2021-09-13 ENCOUNTER — Other Ambulatory Visit (HOSPITAL_COMMUNITY): Payer: Self-pay

## 2021-09-13 NOTE — Telephone Encounter (Signed)
Pt left vm stating her short term disability has been denied because of the paperwork our office sent in. Pt asked for a return call to discuss.  ? ?Routed to [employer]  ?

## 2021-09-13 NOTE — Telephone Encounter (Signed)
Left message to call back  

## 2021-09-20 ENCOUNTER — Other Ambulatory Visit (HOSPITAL_COMMUNITY): Payer: BC Managed Care – PPO

## 2021-09-20 NOTE — Progress Notes (Incomplete)
***In Progress*** ? ?  ?Advanced Heart Failure Clinic Note  ? ?PCP: [name] PA-C, [facility] ?Primary Cardiologist: Dr Terrence Dupont ?HF MD: Dr Haroldine Laws  ?  ?HPI: ?Lynn Estrada is a 61 y.o.with a history of HTN, GERD, nephrolithiasis, partial right nephrectomy, LBBB, and recently diagnosed combine diastolic/systolic HF.    ?  ?Admitted to [facility] 07/12/2021 with progressive SOB in the setting of acute HFrEF. 07/13/2021 Echo showed 20-25%, with normal RV, grade II DD , and septal - lateral dyssnchrony. Cardiology was consulted and she was diuresed with IV Lasix. Offered cath versus nuclear stress to help with etiology. She elected stress test which was complicated by SOB and she was given IV Lasix. Stress test was abnormal and she was transferred to [facility] for Advanced Heart Failure consultation. R/L cath showed minimal CAD, mildly elevated filling pressures and normal CO. cMRI possible showed myocarditis? (see impression below) with LVEF 18%. Discharged on 07/20/2021 with Jardiance and spirnolactone. She was not started on a beta blocker d/t acute decompensation and not started on ARNi due to hypotension. ? ?Saw PCP on 07/25/2021. Started on aspirin and atorvastatin.  ?  ?Received IV Lasix 80 mg in clinic, ReDs 47% 07/27/2021. ?Decreased to 33% on 07/31/2021. ?  ?She was last seen in AHF clinic by APP on 08/28/2021 for follow-up. She reported fatigue with housework and reported SOB with ADLs if she "does too much."  Denied abnormal bleeding, palpitations, CP, dizziness, edema, or PND/Orthopnea. Appetite was ok. Denied fever or chills. Weight at home was stable at 204-205 pounds. Reported compliance with all medications and she wanted to know if she could return to her part time job. ? ?***short term disability for BP cuff denied needed to discuss with Kevan Rosebush?? ?  ? ?Today she returns to HF clinic for pharmacist medication titration. At last visit with APP, no medication changes were made ***.  ? ?Overall feeling  ***. ?Dizziness, lightheadedness, fatigue:  ?Chest pain or palpitations: ? ?How is your breathing?: *** ?SOB: ?Able to complete all ADLs. Activity level *** ? ?Weight at home pounds. Takes furosemide/torsemide/bumex *** mg *** daily.  ?LEE ?PND/Orthopnea ? ?Appetite *** ?Low-salt diet:  ? ?Physical Exam ?Cost/affordability of meds ? ? ?HF Medications: ?Entresto 24-26 mg BID ?Spironolactone 25 mg daily ?Jardiance 10 mg daily ?Digoxin 0.125 mg daily ?Furosemide 40 mg BID ? ? ?Has the patient been experiencing any side effects to the medications prescribed?  {YES NO:22349} ? ?Does the patient have any problems obtaining medications due to transportation or finances?   {YES NO:22349} ? ?Understanding of regimen: {excellent/good/fair/poor:19665} ?Understanding of indications: {excellent/good/fair/poor:19665} ?Potential of compliance: {excellent/good/fair/poor:19665} ?Patient understands to avoid NSAIDs. ?Patient understands to avoid decongestants. ?  ? ?Pertinent Lab Values: ?Serum creatinine ***, BUN ***, Potassium ***, Sodium ***, BNP ***, Magnesium ***, Digoxin ***  ? ?Vital Signs: ?Weight: *** (last clinic weight: ***) ?Blood pressure: ***  ?Heart rate: ***  ? ?Assessment/Plan: ?Chronic Combined Systoic/Diastolic HF, NICM.  ?- 07/13/21 Echo 20-25%, RV normal, grade II DD , and septal -lateral dyssnchrony ?- 07/2021 cMRI possible myocarditis  LVEF 18% RVEF 33% ?- [facility] 07/2021 min CAD. RA 6 PCWP 21, CO 6.8/ CI 3.1  ?- NYHA II-early III. Volume looks good today. ?- Continue Entresto 24/26 mg bid.  ?- Continue Lasix 40 mg daily. ?- Continue digoxin 0.125 mg daily.  ?- Continue spironolactone 25 mg daily  ?- Continue Jardiance 10 mg daily. Given co-pay card. ?- Add beta blocker next. ?- BMET and dig level  today. ?  ?2. HTN ?- BP on the low side today, limiting GDMT. ?- Given Rx for BP cuff, check BP daily and bring to next visit. ?- May need to switch Entresto to losartan. ?  ?3. LBBB ?- QRS 132 [place]. On recent ECG.  ?   ?4. Partial Nephrectomy , R ?- BMET today. ? ?5. Snoring/daytime fatigue ?- We discussed sleep study. She declined. ?  ?6. 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 I do not think she can return to this job yet. I gave her a note to remain out of work until follow up with Dr. Haroldine Laws. ?- She works part-time at [employer], and she would like to return soon (works 3-4 hours/day, 3 days a week, mostly sits). I think it is OK for her to go back to this job with allowances for frequent rest breaks, or time off if needed. Not provided. ?  ?Follow up with PharmD in 2-3 weeks (add beta blocker if able, if sBP < 100, would stop Entresto and start losartan 12.5). Keep follow up with Dr. Haroldine Laws + echo.  ? ? ?Audry Riles, PharmD, BCPS, BCCP, CPP ?Heart Failure Clinic Pharmacist ?[phone] ? ? ?

## 2021-09-21 ENCOUNTER — Other Ambulatory Visit: Payer: Self-pay

## 2021-09-21 ENCOUNTER — Encounter (HOSPITAL_COMMUNITY): Payer: Self-pay | Admitting: *Deleted

## 2021-09-21 ENCOUNTER — Ambulatory Visit (HOSPITAL_COMMUNITY)
Admission: RE | Admit: 2021-09-21 | Discharge: 2021-09-21 | Disposition: A | Discharge status: Home / Self Care | Payer: BC Managed Care – PPO | Source: Ambulatory Visit | Attending: Adult Health | Admitting: Adult Health

## 2021-09-21 ENCOUNTER — Telehealth (HOSPITAL_COMMUNITY): Payer: Self-pay

## 2021-09-21 VITALS — BP 120/78 | HR 83 | Wt 204.0 lb

## 2021-09-21 DIAGNOSIS — N200 Calculus of kidney: Secondary | ICD-10-CM | POA: Diagnosis not present

## 2021-09-21 DIAGNOSIS — I5022 Chronic systolic (congestive) heart failure: Secondary | ICD-10-CM

## 2021-09-21 DIAGNOSIS — I447 Left bundle-branch block, unspecified: Secondary | ICD-10-CM | POA: Insufficient documentation

## 2021-09-21 DIAGNOSIS — R0683 Snoring: Secondary | ICD-10-CM | POA: Insufficient documentation

## 2021-09-21 DIAGNOSIS — K219 Gastro-esophageal reflux disease without esophagitis: Secondary | ICD-10-CM | POA: Diagnosis not present

## 2021-09-21 DIAGNOSIS — I5042 Chronic combined systolic (congestive) and diastolic (congestive) heart failure: Secondary | ICD-10-CM | POA: Insufficient documentation

## 2021-09-21 DIAGNOSIS — I110 Hypertensive heart disease with heart failure: Secondary | ICD-10-CM | POA: Insufficient documentation

## 2021-09-21 MED ORDER — SPIRONOLACTONE 25 MG PO TABS: 25.0000 mg | ORAL_TABLET | Freq: Every day | ORAL | 5 refills | Status: DC

## 2021-09-21 MED ORDER — FUROSEMIDE 40 MG PO TABS: 40.0000 mg | ORAL_TABLET | Freq: Every day | ORAL | 5 refills | Status: DC

## 2021-09-21 NOTE — Patient Instructions (Signed)
It was a pleasure seeing you today! ? ?MEDICATIONS: ?-We are changing your medications today ?- Start taking Entresto 24/26 mg (1 tablet) twice daily.  ?-Call if you have questions about your medications. ? ?NEXT APPOINTMENT: ?Return to clinic in 1 month with Dr. Haroldine Laws. ? ?In general, to take care of your heart failure: ?-Limit your fluid intake to 2 Liters (half-gallon) per day.   ?-Limit your salt intake to ideally 2-3 grams (2000-3000 mg) per day. ?-Weigh yourself daily and record, and bring that "weight diary" to your next appointment.  (Weight gain of 2-3 pounds in 1 day typically means fluid weight.) ?-The medications for your heart are to help your heart and help you live longer.   ?-Please contact us before stopping any of your heart medications. ? ? ?Call the clinic at [phone] with questions or to reschedule future appointments.  ?

## 2021-09-21 NOTE — Progress Notes (Signed)
?Advanced Heart Failure Clinic Note  ? ?PCP: Wynne Dust, [facility] ?Primary Cardiologist: Dr. Terrence Dupont ?HF MD: Dr. Haroldine Laws  ?  ?HPI: ?Lynn Estrada is a 61 y.o.with a history of HTN, GERD, nephrolithiasis, partial right nephrectomy, LBBB, and recently diagnosed combine diastolic/systolic HF.    ?  ?Admitted to [facility] 07/12/2021 with progressive SOB in the setting of acute HFrEF. 07/13/2021 Echo showed 20-25%, with normal RV, grade II DD , and septal - lateral dyssnchrony. Cardiology was consulted and she was diuresed with IV Lasix. Offered cath versus nuclear stress test to help with etiology. She elected stress test which was complicated by SOB and she was given IV Lasix. Stress test was abnormal and she was transferred to [facility] for Advanced Heart Failure consultation. R/L cath showed minimal CAD, mildly elevated filling pressures and normal CO. cMRI possible showed myocarditis? with LVEF 18%. Discharged on 07/20/2021 on Jardiance and spironolactone. She was not started on a beta blocker d/t acute decompensation and not started on ARNi due to hypotension. On 07/25/2021 she saw her PCP who started aspirin and atorvastatin.  ?  ?On 07/27/2021 she received IV Lasix 80 mg in clinic, ReDs 47% which decreased to 33% on 07/31/2021. ?  ?She was last seen in AHF clinic by APP on 08/28/2021 for follow-up. She reported fatigue with housework and reported SOB with ADLs if she "did too much."  Denied abnormal bleeding, palpitations, CP, dizziness, edema, or PND/Orthopnea. Appetite was ok. Denied fever or chills. Weight at home was stable at 204-205 pounds. Reported compliance with all medications and she wanted to know if she could return to her part time job. ? ?Today she returns to HF clinic for pharmacist medication titration. At last visit with APP, no medication changes were made. She mentions during discussion that she has only been taking her Entresto 24-26 mg daily instead of twice daily due to confusion. Her home BPs  have been stable around 110s/70s. She had not taken her medications this morning and BP in clinic today is 120/78. Overall, she reports feeling ok except for a productive cough she contracted on Tuesday after being at an outside event in the evening. She reports mild dizziness and fatigue that persists throughout the day and is worsened by her cold. Reports occasional sharp chest pain that she attributes to indigestion d/t some recent dietary indiscretion. Reports occassional palpitations when laying down flat. Reports overall her SOB has improved, with the exception of her cold, but does report SOB if she over exerts herself.  Endorses SOB/DOE with mild-moderate activity. Takes Lasix 40 mg daily and has not needed any extra. She weighs herself every morning and weight is stable at 199-206 lbs. No LEE, PND or orthopnea. Appetite ok. Adheres to a low salt diet. Has brought disability paperwork. ? ?HF Medications: ?Entresto 24-26 mg BID - patient reports only taking daily ?Spironolactone 25 mg daily ?Jardiance 10 mg daily ?Digoxin 0.125 mg daily ?Furosemide 40 mg daily ? ?Has the patient been experiencing any side effects to the medications prescribed?  No ? ?Does the patient have any problems obtaining medications due to transportation or finances?   No - Astron[place] - Given copay cards for [place] and [place] today. ? ?Understanding of regimen: good ?Understanding of indications: good ?Potential of compliance: good ?Patient understands to avoid NSAIDs. ?Patient understands to avoid decongestants. ?  ? ?Pertinent Lab Values: ?08/28/2021 - Serum creatinine 0.9, BUN 11, Potassium 4.1, Sodium 141, Digoxin 0.4  ? ?Vital Signs: ?Weight: 204  lbs.  (last clinic weight: 206 lbs.) ?Blood pressure: 120/78  ?Heart rate: 83  ? ?Assessment/Plan: ?Chronic Combined Systoic/Diastolic HF, NICM.  ?- 07/13/2021 Echo 20-25%, RV normal, grade II DD , and septal -lateral dyssnchrony ?- 07/2021 cMRI possible myocarditis  LVEF 18%  RVEF 33% ?- [facility] 07/2021 min CAD. RA 6 PCWP 21, CO 6.8/ CI 3.1  ?- NYHA II-III. Volume looks good today. ?- Continue Lasix 40 mg daily. ?- Increase Entresto to 24-26 mg BID. Patient is currently only taking Entresto daily because she was confused and thought it was only a once daily medication.  ?- Continue spironolactone 25 mg daily  ?- Continue Jardiance 10 mg daily.  ?- Continue digoxin 0.125 mg daily. ? ?2. HTN ?- Increase Entresto as above ?- Continue checking BP at home. ? ?  ?3. LBBB ?- QRS 132 ms on recent ECG.  ?  ?4. Partial Nephrectomy , R ?-Scr 0.90 on 08/28/2021 BMET. ? ?5. Snoring/daytime fatigue ?- Previously discussed sleep study at APP visit which she declined. ?  ?  ?Follow up with Dr. Haroldine Laws + Echo on 10/25/2021. ? ? ?Audry Riles, PharmD, BCPS, BCCP, CPP ?Heart Failure Clinic Pharmacist ?[phone] ? ? ? ?

## 2021-09-21 NOTE — Telephone Encounter (Signed)
Surgical clearance fax sent on 09/21/2021 @ 8.30 am ?

## 2021-09-23 ENCOUNTER — Other Ambulatory Visit (HOSPITAL_COMMUNITY): Payer: Self-pay | Admitting: Family Medicine

## 2021-09-27 ENCOUNTER — Telehealth (HOSPITAL_COMMUNITY): Payer: Self-pay | Admitting: Pharmacy Technician

## 2021-09-27 NOTE — Telephone Encounter (Signed)
Advanced Heart Failure Patient Advocate Encounter ? ?Patient called and left message stating that her Jardiance co-pay card was expired when she went to the pharmacy to pick up the medication. I was able to obtain a new co-pay card for her. Called and spoke with the patient. Emailed the patient a copy of the information. ? ?Charlann Boxer, CPhT ? ?

## 2021-10-02 ENCOUNTER — Other Ambulatory Visit (HOSPITAL_COMMUNITY): Payer: Self-pay | Admitting: *Deleted

## 2021-10-02 MED ORDER — EMPAGLIFLOZIN 10 MG PO TABS: 10.0000 mg | ORAL_TABLET | Freq: Every day | ORAL | 2 refills | Status: DC

## 2021-10-03 ENCOUNTER — Telehealth (HOSPITAL_COMMUNITY): Payer: Self-pay | Admitting: *Deleted

## 2021-10-03 ENCOUNTER — Other Ambulatory Visit (HOSPITAL_COMMUNITY): Payer: Self-pay

## 2021-10-03 NOTE — Telephone Encounter (Signed)
Pt left vm requesting a call back about disability paperwork   Routed to Heather Schub,RN 

## 2021-10-05 ENCOUNTER — Encounter (HOSPITAL_COMMUNITY): Payer: Self-pay | Admitting: Internal Medicine

## 2021-10-05 NOTE — Telephone Encounter (Signed)
Had lengthy (15-20 min) conversation/meeting with pt on 3/23 during her pharmacy appt to discuss disability. Pt's disability was denied due to the information in [place] note 08/28/21 stating pt can do sedentary work. Advised pt on 3/23 we will complete the appeal form, write a letter, and send records for an appeal (forms states must be done within 180 days). All were completed, signed by Dr Haroldine Laws and faxed into [facility] at [phone] today, 10/05/21. ? ?Up to date/current FMLA forms also completed, signed by Dr Haroldine Laws and faxed into [place] at [phone]. ? ?Pt has also started process for SS Disability, paper work for this has been mailed to [place]. ? ?[place] message sent pt that all of this has been done, copies of all forms left a front desk for pt to pick up for her records. ?

## 2021-10-20 ENCOUNTER — Other Ambulatory Visit (HOSPITAL_COMMUNITY): Payer: Self-pay | Admitting: Cardiology

## 2021-10-20 MED ORDER — DIGOXIN 125 MCG PO TABS: 0.1250 mg | ORAL_TABLET | Freq: Every day | ORAL | 2 refills | Status: DC

## 2021-10-25 ENCOUNTER — Ambulatory Visit (HOSPITAL_COMMUNITY)
Admission: RE | Admit: 2021-10-25 | Discharge: 2021-10-25 | Disposition: A | Discharge status: Home / Self Care | Payer: BC Managed Care – PPO | Source: Ambulatory Visit | Attending: Physician Assistant | Admitting: Physician Assistant

## 2021-10-25 ENCOUNTER — Encounter (HOSPITAL_COMMUNITY): Payer: Self-pay | Admitting: Internal Medicine

## 2021-10-25 ENCOUNTER — Ambulatory Visit (HOSPITAL_BASED_OUTPATIENT_CLINIC_OR_DEPARTMENT_OTHER)
Admission: RE | Admit: 2021-10-25 | Discharge: 2021-10-25 | Disposition: A | Discharge status: Home / Self Care | Payer: BC Managed Care – PPO | Source: Ambulatory Visit | Attending: Internal Medicine | Admitting: Internal Medicine

## 2021-10-25 VITALS — BP 112/74 | HR 85 | Wt 208.4 lb

## 2021-10-25 DIAGNOSIS — I5042 Chronic combined systolic (congestive) and diastolic (congestive) heart failure: Secondary | ICD-10-CM | POA: Diagnosis present

## 2021-10-25 DIAGNOSIS — I2510 Atherosclerotic heart disease of native coronary artery without angina pectoris: Secondary | ICD-10-CM | POA: Insufficient documentation

## 2021-10-25 DIAGNOSIS — I5022 Chronic systolic (congestive) heart failure: Secondary | ICD-10-CM | POA: Diagnosis not present

## 2021-10-25 DIAGNOSIS — I447 Left bundle-branch block, unspecified: Secondary | ICD-10-CM | POA: Insufficient documentation

## 2021-10-25 DIAGNOSIS — I110 Hypertensive heart disease with heart failure: Secondary | ICD-10-CM | POA: Insufficient documentation

## 2021-10-25 DIAGNOSIS — Z905 Acquired absence of kidney: Secondary | ICD-10-CM | POA: Diagnosis not present

## 2021-10-25 DIAGNOSIS — K219 Gastro-esophageal reflux disease without esophagitis: Secondary | ICD-10-CM | POA: Diagnosis not present

## 2021-10-25 DIAGNOSIS — Z7984 Long term (current) use of oral hypoglycemic drugs: Secondary | ICD-10-CM | POA: Diagnosis not present

## 2021-10-25 DIAGNOSIS — I428 Other cardiomyopathies: Secondary | ICD-10-CM | POA: Diagnosis not present

## 2021-10-25 DIAGNOSIS — I10 Essential (primary) hypertension: Secondary | ICD-10-CM

## 2021-10-25 DIAGNOSIS — Z79899 Other long term (current) drug therapy: Secondary | ICD-10-CM | POA: Diagnosis not present

## 2021-10-25 LAB — ECHOCARDIOGRAM COMPLETE
Area-P 1/2: 5.62 cm2
Calc EF: 33.4 %
S' Lateral: 5.6 cm
Single Plane A2C EF: 36.2 %
Single Plane A4C EF: 29.6 %

## 2021-10-25 MED ORDER — FUROSEMIDE 40 MG PO TABS
40.0000 mg | ORAL_TABLET | Freq: Every day | ORAL | 5 refills | Status: DC | PRN
Start: 2021-10-25 — End: 2022-08-01

## 2021-10-25 NOTE — Progress Notes (Signed)
? ?PCP: [name] PA-C, [facility] ?Primary Cardiologist: Dr Terrence Dupont ?HF MD: Dr Haroldine Laws  ? ?HPI: ?Lynn Estrada is a 60 y.o.with a history of HTN, GERD, nephrolithiasis, partial right nephrectomy, LBBB, and combined diastolic/systolic HF.    ?  ?Admitted 1/23 with acute HFrEF. 07/13/21 Echo 20-25%, RV normal, grade II DD , and septal - lateral dyssnchrony. R/L cath w/ Minimal CAD, mildly elevated filling pressures and normal CO. cMRI ? Myocarditis (see impression below). LVEF 18%. Placed on Jardiance and spiro. Not on bb with acute decompensation. No ARNi with hypotension. Discharged on 07/20/21.  ? ?Today she returns for HF follow up. Says she gets tired quickly. Able to do housework but has to take breaks. Tried going back to work PT at [employer] for 3 days and got tired easily. No CP, edema, orthopnea or PND. Compliant with meds. Says BP is labile. Having trouble sleeping. Doesn't know if she snores.  ? ?Echo today 10/25/21 EF 30-35% ? ?Cardiac Studies  ?07/2021 R/L cath w/ Minimal CAD. EF 20%, Mildly elevated filling pressures and normal CO. ? ?07/13/21 Echo 20-25%, RV normal, grade II DD , and septal -lateral dyssnchrony ? ?07/2021 cMRI Myocarditis (see impression below). LVEF 18% . Although modified [facility] Criteria for myocarditis is technically met, LGE and parametric mapping are not class for this.  ? ?ROS: All systems negative except as listed in HPI, PMH and Problem List. ? ?SH:  ?Social History  ? ?Socioeconomic History  ? Marital status: Married  ?  Spouse name: Delfino Lovett Robenson  ? Number of children: 1  ? Years of education: college  ? Highest education level: Not on file  ?Occupational History  ? Occupation: Higher education careers adviser  ?Tobacco Use  ? Smoking status: Former  ?  Packs/day: 0.75  ?  Years: 5.00  ?  Pack years: 3.75  ?  Types: Cigarettes  ?  Quit date: 01/10/1997  ?  Years since quitting: 24.8  ? Smokeless tobacco: Never  ?Vaping Use  ? Vaping Use: Never used  ?Substance and Sexual  Activity  ? Alcohol use: Yes  ?  Alcohol/week: 0.0 standard drinks  ?  Comment: social use; once a month  ? Drug use: No  ? Sexual activity: Yes  ?  Partners: Male  ?  Birth control/protection: Surgical  ?Other Topics Concern  ? Not on file  ?Social History Narrative  ? Lives with her husband and their daughter and their dog.  ? ?Social Determinants of Health  ? ?Financial Resource Strain: Medium Risk  ? Difficulty of Paying Living Expenses: Somewhat hard  ?Food Insecurity: Food Insecurity Present  ? Worried About Charity fundraiser in the Last Year: Sometimes true  ? Ran Out of Food in the Last Year: Never true  ?Transportation Needs: No Transportation Needs  ? Lack of Transportation (Medical): No  ? Lack of Transportation (Non-Medical): No  ?Physical Activity: Not on file  ?Stress: Not on file  ?Social Connections: Not on file  ?Intimate Partner Violence: Not on file  ? ?FH:  ?Family History  ?Problem Relation Age of Onset  ? Hypertension Sister   ? Eczema Sister   ? Lupus Sister   ? Sarcoidosis Mother   ? Prostate cancer Father   ? Gout Maternal Grandmother   ? Diabetes Maternal Grandmother   ?     leg amputations  ? Breast cancer Maternal Aunt   ? Stomach cancer Neg Hx   ? Colon cancer Neg Hx   ? ?  Past Medical History:  ?Diagnosis Date  ? Anemia   ? Eczema   ? GERD (gastroesophageal reflux disease)   ? History of adenomatous polyp of colon   ? 08-03-2016  tubular adenoma x2 and hyperplastic  ? History of chronic gastritis 08/03/2016  ? History of ectopic pregnancy 1988  ? s/p  left salpingectomy  ? History of endometriosis   ? History of kidney stones   ? History of partial nephrectomy   ? right pelvis for very large stone  ? History of pneumonia 07/02/2016  ? CAP  ? History of sepsis   ? 07-01-2016  sepsis w/ pyelonephritis, CAP /   12-31-2016  urosepsis w/ kidney stone obstruction  ? History of uterine leiomyoma   ? Hypertension   ? Left ureteral stone   ? Nephrolithiasis   ? left obstructive stone and right  non-obstructive stone  per CT 12-31-2016  ? Urgency of urination   ? ?Current Outpatient Medications  ?Medication Sig Dispense Refill  ? aspirin 81 MG EC tablet 81 mg daily.    ? atorvastatin (LIPITOR) 10 MG tablet Take 10 mg by mouth daily.    ? digoxin (LANOXIN) 0.125 MG tablet Take 1 tablet (0.125 mg total) by mouth daily. 30 tablet 2  ? empagliflozin (JARDIANCE) 10 MG TABS tablet Take 1 tablet (10 mg total) by mouth daily. 30 tablet 2  ? furosemide (LASIX) 40 MG tablet Take 1 tablet (40 mg total) by mouth daily. 30 tablet 5  ? Multiple Vitamins-Minerals (MULTIVITAMIN ADULT PO) Take 1 tablet by mouth daily.    ? sacubitril-valsartan (ENTRESTO) 24-26 MG Take 1 tablet by mouth 2 (two) times daily. 60 tablet 11  ? spironolactone (ALDACTONE) 25 MG tablet Take 1 tablet (25 mg total) by mouth daily. 30 tablet 5  ? ?No current facility-administered medications for this encounter.  ? ?BP 112/74   Pulse 85   Wt 94.5 kg (208 lb 6.4 oz)   SpO2 95%   BMI 27.50 kg/m?  ? ?Wt Readings from Last 3 Encounters:  ?10/25/21 94.5 kg (208 lb 6.4 oz)  ?09/21/21 92.5 kg (204 lb)  ?08/28/21 93.4 kg (206 lb)  ? ?PHYSICAL EXAM: ?General:  Well appearing. No resp difficulty ?HEENT: normal ?Neck: supple. no JVD. Carotids 2+ bilat; no bruits. No lymphadenopathy or thryomegaly appreciated. ?Cor: PMI nondisplaced. Regular rate & rhythm. No rubs, gallops or murmurs. ?Lungs: clear ?Abdomen: soft, nontender, nondistended. No hepatosplenomegaly. No bruits or masses. Good bowel sounds. ?Extremities: no cyanosis, clubbing, rash, edema ?Neuro: alert & orientedx3, cranial nerves grossly intact. moves all 4 extremities w/o difficulty. Affect pleasant ? ? ?ASSESSMENT & PLAN: ? ?Chronic Combined Systolic/Diastolic HF, NICM.  ?- 07/13/21 Echo 20-25%, RV normal, grade II DD , and septal -lateral dyssnchrony ?- 07/2021 cMRI possible myocarditis  LVEF 18% RVEF 33% ?- [facility] 07/2021 min CAD. RA 6 PCWP 21, CO 6.8/ CI 3.1 ?- LBBB 147m. Doubt wide enough to  cause cardiomyopathy. Also had PVCs ?- Echo today 10/25/21 EF 30-35% -> EF improving but still with significant LV dysfunction (suspect resolving myocarditis) ?- NYHA III.  Volume looks good today. Switch lasix to as needed only.  ?- Continue Entresto 24/26 mg bid.  ?- Continue digoxin 0.125 mg daily.  ?- Continue spironolactone 25 mg daily  ?- Continue Jardiance 10 mg daily.  ?- BP too low to add b-blocker yet ?- BMET and dig level today. ? ?2. HTN ?- Blood pressure running low at times. Change lasix to prn  ? ?  3. LBBB ?- QRS [address].  ?- Doubt wide enough to cause cardiomyopathy ? ?4. Partial R Nephrectomy ?- BMET today. ? ?5. Snoring/daytime fatigue ?- We discussed sleep study. She declined. ? ? ?Glori Bickers MD ?3:10 PM ?

## 2021-10-25 NOTE — Patient Instructions (Signed)
Medication Changes: ? ?Take Furosemide (Lasix) ONLY AS NEEDED ? ?Lab Work: ? ?None ? ?Testing/Procedures: ? ?Your physician has recommended that you have a cardiopulmonary stress test (CPX). CPX testing is a non-invasive measurement of heart and lung function. It replaces a traditional treadmill stress test. This type of test provides a tremendous amount of information that relates not only to your present condition but also for future outcomes. This test combines measurements of you ventilation, respiratory gas exchange in the lungs, electrocardiogram (EKG), blood pressure and physical response before, during, and following an exercise protocol. ? ? ?Referrals: ? ?You have been referred to Cardiac Rehab, they will call you to schedule ? ?Special Instructions // Education: ? ?Do the following things EVERYDAY: ?Weigh yourself in the morning before breakfast. Write it down and keep it in a log. ?Take your medicines as prescribed ?Eat low salt foods--Limit salt (sodium) to 2000 mg per day.  ?Stay as active as you can everyday ?Limit all fluids for the day to less than 2 liters ? ? ?Follow-Up in: 2 months ? ?At the [facility], you and your health needs are our priority. We have a designated team specialized in the treatment of Heart Failure. This Care Team includes your primary Heart Failure Specialized Cardiologist (physician), Advanced Practice Providers (APPs- Physician Assistants and Nurse Practitioners), and Pharmacist who all work together to provide you with the care you need, when you need it.  ? ?You may see any of the following providers on your designated Care Team at your next follow up: ? ?Dr Glori Bickers ?Dr Loralie Champagne ?Darrick Grinder, NP ?Lyda Jester, PA ?Jessica Milford,NP ?Marlyce Huge, PA ?Audry Riles, PharmD ? ? ?Please be sure to bring in all your medications bottles to every appointment.  ? ?Need to Contact us: ? ?If you have any questions or concerns before your next  appointment please send [place] a message through [place] or call our office at [phone].   ? ?TO LEAVE A MESSAGE FOR THE NURSE SELECT OPTION 2, PLEASE LEAVE A MESSAGE INCLUDING: ?YOUR NAME ?DATE OF BIRTH ?CALL BACK NUMBER ?REASON FOR CALL**this is important as we prioritize the call backs ? ?YOU WILL RECEIVE A CALL BACK THE SAME DAY AS LONG AS YOU CALL BEFORE 4:00 PM ? ? ?

## 2021-10-25 NOTE — Progress Notes (Signed)
?  Echocardiogram ?2D Echocardiogram has been performed. ? ?Lynn Estrada ?10/25/2021, 2:53 PM ?

## 2021-10-25 NOTE — Addendum Note (Signed)
Encounter addended by: Scarlette Calico, RN on: 10/25/2021 3:37 PM ? Actions taken: Diagnosis association updated, Clinical Note Signed, Order list changed

## 2021-10-26 DIAGNOSIS — U099 Post covid-19 condition, unspecified: Secondary | ICD-10-CM | POA: Insufficient documentation

## 2021-10-27 ENCOUNTER — Encounter (HOSPITAL_COMMUNITY): Payer: Self-pay | Admitting: *Deleted

## 2021-10-27 NOTE — Progress Notes (Signed)
Received referral from Dr. Haroldine Laws for this pt to participate in Cardiac rehab with the diagnosis of Chronic combined systolic and diastolic heart failure. Clinical review of pt follow up appt on  with 4/26 - cardiologist office note.  Pt appropriate for scheduling for on site cardiac rehab and/or enrollment in Virtual Cardiac Rehab.  Will forward to staff for follow up. Cherre Huger, BSN ?Cardiac and Pulmonary Rehab Nurse Navigator  ? ?

## 2021-10-30 ENCOUNTER — Encounter (HOSPITAL_COMMUNITY): Payer: Self-pay | Admitting: *Deleted

## 2021-10-30 NOTE — Progress Notes (Signed)
Received fax from [facility] requesting 10/25/21 OV note, note faxed to them at [phone] ?

## 2021-11-02 ENCOUNTER — Telehealth: Payer: Self-pay | Admitting: Physician Assistant

## 2021-11-02 NOTE — Telephone Encounter (Signed)
Pt called because she developed severe R chest pain. Worse w/ certain movements and deep inspiration. If she holds her arm to her chest, that helps a Golberg. Better lying down, worse sitting up. No cough. Resp at baseline. ? ?Weight 200.8 lbs today, lower than at recent office visit.  ? ?She is very concerned about the pain and would like to see someone from the CHF office. ? ?Tylenol no help. Has not taken anything else. Has not eaten. ? ?Left msg w/ CHF triage, no response. ? ?Messaged Dr Haroldine Laws. He recommended she see her PCP for this, unlikely this is cardiac since a recent cath had minimal CAD. ? ?Called the pt back and spoke w/ her. She is feeling a Chait better now. No longer feels that she needs to be seen at the ER. ? ?She agrees to contact her PCP try to be seen by them. ? ?Rosaria Ferries, PA-C ?11/02/2021 ?1:35 PM ? ? ? ? ?

## 2021-11-08 ENCOUNTER — Ambulatory Visit (HOSPITAL_COMMUNITY): Payer: BC Managed Care – PPO | Attending: Cardiology

## 2021-11-08 DIAGNOSIS — I5022 Chronic systolic (congestive) heart failure: Secondary | ICD-10-CM | POA: Diagnosis not present

## 2021-11-08 DIAGNOSIS — A419 Sepsis, unspecified organism: Secondary | ICD-10-CM | POA: Insufficient documentation

## 2021-11-08 DIAGNOSIS — K2950 Unspecified chronic gastritis without bleeding: Secondary | ICD-10-CM | POA: Diagnosis not present

## 2021-11-08 DIAGNOSIS — N200 Calculus of kidney: Secondary | ICD-10-CM | POA: Diagnosis not present

## 2021-11-08 DIAGNOSIS — Z87442 Personal history of urinary calculi: Secondary | ICD-10-CM | POA: Insufficient documentation

## 2021-11-08 DIAGNOSIS — I5032 Chronic diastolic (congestive) heart failure: Secondary | ICD-10-CM | POA: Insufficient documentation

## 2021-11-08 DIAGNOSIS — D649 Anemia, unspecified: Secondary | ICD-10-CM | POA: Diagnosis not present

## 2021-11-08 DIAGNOSIS — I110 Hypertensive heart disease with heart failure: Secondary | ICD-10-CM | POA: Diagnosis not present

## 2021-11-08 DIAGNOSIS — K635 Polyp of colon: Secondary | ICD-10-CM | POA: Insufficient documentation

## 2021-11-08 DIAGNOSIS — O008 Other ectopic pregnancy without intrauterine pregnancy: Secondary | ICD-10-CM | POA: Diagnosis not present

## 2021-11-08 DIAGNOSIS — I447 Left bundle-branch block, unspecified: Secondary | ICD-10-CM | POA: Diagnosis not present

## 2021-11-08 DIAGNOSIS — L309 Dermatitis, unspecified: Secondary | ICD-10-CM | POA: Diagnosis not present

## 2021-11-08 DIAGNOSIS — R6520 Severe sepsis without septic shock: Secondary | ICD-10-CM | POA: Insufficient documentation

## 2021-11-23 ENCOUNTER — Telehealth (HOSPITAL_COMMUNITY): Payer: Self-pay

## 2021-11-23 NOTE — Telephone Encounter (Signed)
Pt insurance is active and benefits verified through [employer] Co-pay 0, DED $800/$800 met, out of pocket $4,200/$4,200 met, co-insurance 20%. no pre-authorization required. Marge/[employer] 11/23/2021'@10' Gentry Roch, REF# [id number]

## 2021-11-24 ENCOUNTER — Telehealth (HOSPITAL_COMMUNITY): Payer: Self-pay

## 2021-11-24 NOTE — Telephone Encounter (Signed)
Forms faxed to [facility] on 11/24/2021

## 2021-12-07 ENCOUNTER — Encounter (HOSPITAL_COMMUNITY): Payer: Self-pay

## 2021-12-07 ENCOUNTER — Telehealth (HOSPITAL_COMMUNITY): Payer: Self-pay

## 2021-12-07 NOTE — Telephone Encounter (Signed)
Attempted to call patient in regards to Cardiac Rehab - LM on VM Mailed letter 

## 2021-12-29 ENCOUNTER — Encounter (HOSPITAL_COMMUNITY): Payer: Self-pay

## 2021-12-29 ENCOUNTER — Ambulatory Visit (HOSPITAL_COMMUNITY)
Admission: RE | Admit: 2021-12-29 | Discharge: 2021-12-29 | Disposition: A | Discharge status: Home / Self Care | Payer: BC Managed Care – PPO | Source: Ambulatory Visit | Attending: Family Medicine | Admitting: Family Medicine

## 2021-12-29 VITALS — BP 100/72 | HR 106 | Wt 208.0 lb

## 2021-12-29 DIAGNOSIS — K219 Gastro-esophageal reflux disease without esophagitis: Secondary | ICD-10-CM | POA: Insufficient documentation

## 2021-12-29 DIAGNOSIS — I428 Other cardiomyopathies: Secondary | ICD-10-CM | POA: Insufficient documentation

## 2021-12-29 DIAGNOSIS — I10 Essential (primary) hypertension: Secondary | ICD-10-CM | POA: Diagnosis not present

## 2021-12-29 DIAGNOSIS — I5022 Chronic systolic (congestive) heart failure: Secondary | ICD-10-CM

## 2021-12-29 DIAGNOSIS — Z905 Acquired absence of kidney: Secondary | ICD-10-CM | POA: Insufficient documentation

## 2021-12-29 DIAGNOSIS — R0683 Snoring: Secondary | ICD-10-CM

## 2021-12-29 DIAGNOSIS — Z7984 Long term (current) use of oral hypoglycemic drugs: Secondary | ICD-10-CM | POA: Insufficient documentation

## 2021-12-29 DIAGNOSIS — I110 Hypertensive heart disease with heart failure: Secondary | ICD-10-CM | POA: Diagnosis present

## 2021-12-29 DIAGNOSIS — I2510 Atherosclerotic heart disease of native coronary artery without angina pectoris: Secondary | ICD-10-CM | POA: Insufficient documentation

## 2021-12-29 DIAGNOSIS — R5383 Other fatigue: Secondary | ICD-10-CM

## 2021-12-29 DIAGNOSIS — Z79899 Other long term (current) drug therapy: Secondary | ICD-10-CM | POA: Diagnosis not present

## 2021-12-29 DIAGNOSIS — I447 Left bundle-branch block, unspecified: Secondary | ICD-10-CM | POA: Insufficient documentation

## 2021-12-29 DIAGNOSIS — I5042 Chronic combined systolic (congestive) and diastolic (congestive) heart failure: Secondary | ICD-10-CM | POA: Diagnosis present

## 2021-12-29 LAB — BASIC METABOLIC PANEL
Anion gap: 7 (ref 5–15)
BUN: 17 mg/dL (ref 6–20)
CO2: 24 mmol/L (ref 22–32)
Calcium: 9.2 mg/dL (ref 8.9–10.3)
Chloride: 107 mmol/L (ref 98–111)
Creatinine, Ser: 1.08 mg/dL — ABNORMAL HIGH (ref 0.44–1.00)
GFR, Estimated: 59 mL/min — ABNORMAL LOW (ref >60)
Glucose, Bld: 128 mg/dL — ABNORMAL HIGH (ref 70–99)
Potassium: 4.2 mmol/L (ref 3.5–5.1)
Sodium: 138 mmol/L (ref 135–145)

## 2021-12-29 LAB — DIGOXIN LEVEL: Digoxin Level: <0.2 ng/mL — ABNORMAL LOW (ref 0.8–2.0)

## 2021-12-29 NOTE — Patient Instructions (Signed)
Thank you for coming in today  Labs were done today, if any labs are abnormal the clinic will call you No news is good news  Your physician recommends that you schedule a follow-up appointment in:  4-6 weeks in clinic 3-4 months with Dr. Aundra Dubin  At the [facility], you and your health needs are our priority. As part of our continuing mission to provide you with exceptional heart care, we have created designated Provider Care Teams. These Care Teams include your primary Cardiologist (physician) and Advanced Practice Providers (APPs- Physician Assistants and Nurse Practitioners) who all work together to provide you with the care you need, when you need it.   You may see any of the following providers on your designated Care Team at your next follow up: Dr Glori Bickers Dr Haynes Kerns, NP Lyda Jester, [place] [facility] [place], [place] Audry Riles, PharmD   Please be sure to bring in all your medications bottles to every appointment.   If you have any questions or concerns before your next appointment please send [place] a message through [place] or call our office at [phone].    TO LEAVE A MESSAGE FOR THE NURSE SELECT OPTION 2, PLEASE LEAVE A MESSAGE INCLUDING: YOUR NAME DATE OF BIRTH CALL BACK NUMBER REASON FOR CALL**this is important as we prioritize the call backs  YOU WILL RECEIVE A CALL BACK THE SAME DAY AS LONG AS YOU CALL BEFORE 4:00 PM

## 2021-12-29 NOTE — Progress Notes (Signed)
PCP: Wynne Dust, [facility] Primary Cardiologist: Dr Terrence Dupont HF MD: Dr Haroldine Laws   HPI: Lynn Estrada is a 61 y.o.with a history of HTN, GERD, nephrolithiasis, partial right nephrectomy, LBBB, and combined diastolic/systolic HF.      Admitted 1/23 with acute HFrEF. 07/13/21 Echo 20-25%, RV normal, grade II DD , and septal - lateral dyssnchrony. R/L cath w/ Minimal CAD, mildly elevated filling pressures and normal CO. cMRI ? Myocarditis (see impression below). LVEF 18%. Placed on Jardiance and spiro. Not on bb with acute decompensation. No ARNi with hypotension. Discharged on 07/20/21.   Echo 10/25/21 EF 30-35%  Today she returns for HF follow up. Overall feeling fine. Walking 30 minutes 3x/week, mild dyspnea walking up inclines. + Dizzy with bending over. Fatigue with housework. Denies palpitations, CP, edema, or PND/Orthopnea. Appetite ok. No fever or chills. Weight at home 206 pounds. Taking all medications. Last visit lasix changed to PRN, takes now occasionally. Husband says she snores. Remains out of work.   Cardiac Studies  - Echo (4/23): EF 30-35%  - R/L cath (1/23): w/ Minimal CAD. EF 20%, Mildly elevated filling pressures and normal CO.  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 LVEF 18% . Although modified [facility] Criteria for myocarditis is technically met, LGE and parametric mapping are not class for this.   ROS: All systems negative except as listed in HPI, PMH and Problem List.  SH:  Social History   Socioeconomic History   Marital status: Married    Spouse name: Lynn Estrada   Number of children: 1   Years of education: college   Highest education level: Not on file  Occupational History   Occupation: Higher education careers adviser  Tobacco Use   Smoking status: Former    Packs/day: 0.75    Years: 5.00    Total pack years: 3.75    Types: Cigarettes    Quit date:  01/10/1997    Years since quitting: 24.9   Smokeless tobacco: Never  Vaping Use   Vaping Use: Never used  Substance and Sexual Activity   Alcohol use: Yes    Alcohol/week: 0.0 standard drinks of alcohol    Comment: social use; once a month   Drug use: No   Sexual activity: Yes    Partners: Male    Birth control/protection: Surgical  Other Topics Concern   Not on file  Social History Narrative   Lives with her husband and their daughter and their dog.   Social Determinants of Health   Financial Resource Strain: Medium Risk (07/27/2021)   Overall Financial Resource Strain (CARDIA)    Difficulty of Paying Living Expenses: Somewhat hard  Food Insecurity: Food Insecurity Present (07/27/2021)   Hunger Vital Sign    Worried About Running Out of Food in the Last Year: Sometimes true    Ran Out of Food in the Last Year: Never true  Transportation Needs: No Transportation Needs (07/20/2021)   PRAPARE - Hydrologist (Medical): No    Lack of Transportation (Non-Medical): No  Physical Activity: Not on file  Stress: Not on file  Social Connections: Not on file  Intimate Partner Violence: Not on file   FH:  Family History  Problem Relation Age of Onset   Hypertension Sister    Eczema Sister    Lupus Sister    Sarcoidosis Mother    Prostate cancer Father  Gout Maternal Grandmother    Diabetes Maternal Grandmother        leg amputations   Breast cancer Maternal Aunt    Stomach cancer Neg Hx    Colon cancer Neg Hx    Past Medical History:  Diagnosis Date   Anemia    Eczema    GERD (gastroesophageal reflux disease)    History of adenomatous polyp of colon    08-03-2016  tubular adenoma x2 and hyperplastic   History of chronic gastritis 08/03/2016   History of ectopic pregnancy 1988   s/p  left salpingectomy   History of endometriosis    History of kidney stones    History of partial nephrectomy    right pelvis for very large stone   History of  pneumonia 07/02/2016   CAP   History of sepsis    07-01-2016  sepsis w/ pyelonephritis, CAP /   12-31-2016  urosepsis w/ kidney stone obstruction   History of uterine leiomyoma    Hypertension    Left ureteral stone    Nephrolithiasis    left obstructive stone and right non-obstructive stone  per CT 12-31-2016   Urgency of urination    Current Outpatient Medications  Medication Sig Dispense Refill   aspirin 81 MG EC tablet 81 mg daily.     atorvastatin (LIPITOR) 10 MG tablet Take 10 mg by mouth daily.     digoxin (LANOXIN) 0.125 MG tablet Take 1 tablet (0.125 mg total) by mouth daily. 30 tablet 2   empagliflozin (JARDIANCE) 10 MG TABS tablet Take 1 tablet (10 mg total) by mouth daily. 30 tablet 2   furosemide (LASIX) 40 MG tablet Take 1 tablet (40 mg total) by mouth daily as needed. 30 tablet 5   Multiple Vitamins-Minerals (MULTIVITAMIN ADULT PO) Take 1 tablet by mouth daily.     sacubitril-valsartan (ENTRESTO) 24-26 MG Take 1 tablet by mouth 2 (two) times daily. 60 tablet 11   spironolactone (ALDACTONE) 25 MG tablet Take 1 tablet (25 mg total) by mouth daily. 30 tablet 5   No current facility-administered medications for this encounter.   BP 100/72   Pulse (!) 106   Wt 94.3 kg (208 lb)   SpO2 94%   BMI 27.44 kg/m   Wt Readings from Last 3 Encounters:  12/29/21 94.3 kg (208 lb)  10/25/21 94.5 kg (208 lb 6.4 oz)  09/21/21 92.5 kg (204 lb)   PHYSICAL EXAM: General:  NAD. No resp difficulty HEENT: Normal Neck: Supple. No JVD. Carotids 2+ bilat; no bruits. No lymphadenopathy or thryomegaly appreciated. Cor: PMI nondisplaced. Regular rate & rhythm. No rubs, gallops or murmurs. Lungs: Clear Abdomen: Soft, nontender, nondistended. No hepatosplenomegaly. No bruits or masses. Good bowel sounds. Extremities: No cyanosis, clubbing, rash, edema Neuro: Alert & oriented x 3, cranial nerves grossly intact. Moves all 4 extremities w/o difficulty. Affect pleasant.  ASSESSMENT &  PLAN: Chronic Combined Systolic/Diastolic HF, NICM.  - 07/13/21 Echo 20-25%, RV normal, grade II DD , and septal -lateral dyssnchrony - 07/2021 cMRI possible myocarditis  LVEF 18% RVEF 33% - [facility] 07/2021 min CAD. RA 6 PCWP 21, CO 6.8/ CI 3.1 - LBBB 140m. Doubt wide enough to cause cardiomyopathy. Also had PVCs - Echo(4/23): EF 30-35% -> EF improving but still with significant LV dysfunction (suspect resolving myocarditis) - NYHA II.  Volume looks good today.  - Continue Lasix PRN  - Continue Entresto 24/26 mg bid.  - Continue digoxin 0.125 mg daily.  - Continue spironolactone 25 mg  daily  - Continue Jardiance 10 mg daily.  - BP too low to add b-blocker today. - BMET and dig level today.  2. HTN - Well-controlled. - No changes.  3. LBBB - QRS 132 Lynn.  - Doubt wide enough to cause cardiomyopathy.  4. Partial R Nephrectomy - BMET today.  5. Snoring/daytime fatigue - We discussed sleep study again, she will think about it.  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 She does not feel she can return on a full time basis right now, and I agree. I asked her to reach out to her employer if she would be able to return PT. She will discuss with her employer and get back to [place].  Follow up in 4-6 weeks with APP (add low dose Toprol) and 3-4 months with Dr. Haroldine Laws.  Lynn Estrada [place] FNP-BC 11:06 AM

## 2022-01-01 ENCOUNTER — Telehealth (HOSPITAL_COMMUNITY): Payer: Self-pay

## 2022-01-01 NOTE — Telephone Encounter (Signed)
No response from pt in regards to cardiac rehab. Closed referral 

## 2022-01-11 ENCOUNTER — Encounter (HOSPITAL_COMMUNITY)
Admission: RE | Admit: 2022-01-11 | Discharge: 2022-01-11 | Disposition: A | Discharge status: Home / Self Care | Payer: BC Managed Care – PPO | Source: Ambulatory Visit | Attending: Internal Medicine | Admitting: Internal Medicine

## 2022-01-11 ENCOUNTER — Telehealth (HOSPITAL_COMMUNITY): Payer: Self-pay

## 2022-01-11 VITALS — BP 98/60 | HR 98 | Ht 73.0 in | Wt 209.2 lb

## 2022-01-11 DIAGNOSIS — I5042 Chronic combined systolic (congestive) and diastolic (congestive) heart failure: Secondary | ICD-10-CM | POA: Insufficient documentation

## 2022-01-11 NOTE — Progress Notes (Signed)
Cardiac Individual Treatment Plan  Patient Details  Name: Lynn Estrada MRN: [id number] Date of Birth: 1960/11/22 Referring Provider:   Flowsheet Row INTENSIVE CARDIAC REHAB ORIENT from 01/11/2022 in [place]  Referring Provider Glori Bickers, MD       Initial Encounter Date:  [place] from 01/11/2022 in [facility]  Date 01/11/22       Visit Diagnosis: Chronic combined systolic and diastolic heart failure ([place])  Patient's Home Medications on Admission:  Current Outpatient Medications:    acetaminophen (TYLENOL) 500 MG tablet, Take 500-1,000 mg by mouth every 6 (six) hours as needed (for pain.)., Disp: , Rfl:    atorvastatin (LIPITOR) 10 MG tablet, Take 10 mg by mouth daily., Disp: , Rfl:    cyclobenzaprine (FLEXERIL) 5 MG tablet, Take 5-10 mg by mouth 3 (three) times daily as needed for spasms., Disp: , Rfl:    digoxin (LANOXIN) 0.125 MG tablet, Take 1 tablet (0.125 mg total) by mouth daily. (Patient taking differently: Take 0.125 mg by mouth daily with lunch.), Disp: 30 tablet, Rfl: 2   empagliflozin (JARDIANCE) 10 MG TABS tablet, Take 1 tablet (10 mg total) by mouth daily. (Patient taking differently: Take 10 mg by mouth daily with lunch.), Disp: 30 tablet, Rfl: 2   furosemide (LASIX) 40 MG tablet, Take 1 tablet (40 mg total) by mouth daily as needed. (Patient taking differently: Take 40 mg by mouth daily with lunch.), Disp: 30 tablet, Rfl: 5   HYDROcodone-acetaminophen (NORCO) 10-325 MG tablet, Take 0.5 tablets by mouth every 6 (six) hours as needed for pain., Disp: , Rfl:    Multiple Vitamin (MULTIVITAMIN WITH MINERALS) TABS tablet, Take 1 tablet by mouth daily with lunch. One A Day for Women 50+, Disp: , Rfl:    sacubitril-valsartan (ENTRESTO) 24-26 MG, Take 1 tablet by mouth 2 (two) times daily., Disp: 60 tablet, Rfl: 11   spironolactone (ALDACTONE) 25 MG tablet, Take 1  tablet (25 mg total) by mouth daily. (Patient taking differently: Take 25 mg by mouth daily with lunch.), Disp: 30 tablet, Rfl: 5   aspirin 81 MG EC tablet, Take 81 mg by mouth daily with lunch., Disp: , Rfl:   Past Medical History: Past Medical History:  Diagnosis Date   Anemia    Eczema    GERD (gastroesophageal reflux disease)    History of adenomatous polyp of colon    08-03-2016  tubular adenoma x2 and hyperplastic   History of chronic gastritis 08/03/2016   History of ectopic pregnancy 1988   s/p  left salpingectomy   History of endometriosis    History of kidney stones    History of partial nephrectomy    right pelvis for very large stone   History of pneumonia 07/02/2016   CAP   History of sepsis    07-01-2016  sepsis w/ pyelonephritis, CAP /   12-31-2016  urosepsis w/ kidney stone obstruction   History of uterine leiomyoma    Hypertension    Left ureteral stone    Nephrolithiasis    left obstructive stone and right non-obstructive stone  per CT 12-31-2016   Urgency of urination     Tobacco Use: Social History   Tobacco Use  Smoking Status Former   Packs/day: 0.75   Years: 5.00   Total pack years: 3.75   Types: Cigarettes   Quit date: 01/10/1997   Years since quitting: 25.0  Smokeless Tobacco Never  Labs: Review Flowsheet       Latest Ref Rng & Units 07/26/2015 02/01/2017 07/14/2021 07/18/2021  Labs for ITP Cardiac and Pulmonary Rehab  Cholestrol 0 - 200 mg/dL 195  - 121  -  LDL (calc) 0 - 99 mg/dL 131  - 70  -  HDL-C >40 mg/dL 32  - 32  -  Trlycerides <150 mg/dL 161  - 95  -  Hemoglobin A1c 4.8 - 5.6 % - - 6.2  -  PH, Arterial 7.350 - 7.450 - - - 7.391   PCO2 arterial 32.0 - 48.0 mmHg - - - 44.3   Bicarbonate 20.0 - 28.0 mmol/L - - - 28.3  25.0  26.8   TCO2 22 - 32 mmol/L - 26  - '30  26  28   '$ O2 Saturation % - - - 65.0  70.0  99.0     Capillary Blood Glucose: No results found for: "GLUCAP"   Exercise Target Goals: Exercise Program  Goal: Individual exercise prescription set using results from initial 6 min walk test and THRR while considering  patient's activity barriers and safety.   Exercise Prescription Goal: Initial exercise prescription builds to 30-45 minutes a day of aerobic activity, 2-3 days per week.  Home exercise guidelines will be given to patient during program as part of exercise prescription that the participant will acknowledge.  Activity Barriers & Risk Stratification:  Activity Barriers & Cardiac Risk Stratification - 01/11/22 1557       Activity Barriers & Cardiac Risk Stratification   Activity Barriers Back Problems;Neck/Spine Problems;Joint Problems;Deconditioning;Muscular Weakness;Decreased Ventricular Function;Balance Concerns    Cardiac Risk Stratification High   under 5 mets on walk test            6 Minute Walk:  6 Minute Walk     Row Name 01/11/22 1555         6 Minute Walk   Phase Initial     Distance 1479 feet     Walk Time 6 minutes     # of Rest Breaks 0     MPH 2.8     METS 3.81     RPE 11     Perceived Dyspnea  0     VO2 Peak 13.32     Symptoms Yes (comment)     Comments left knee pain, 3/10, resolved with rest.     Resting HR 98 bpm     Resting BP 98/60     Resting Oxygen Saturation  98 %     Exercise Oxygen Saturation  during 6 min walk 100 %     Max Ex. HR 114 bpm     Max Ex. BP 104/62     2 Minute Post BP 98/58              Oxygen Initial Assessment:   Oxygen Re-Evaluation:   Oxygen Discharge (Final Oxygen Re-Evaluation):   Initial Exercise Prescription:  Initial Exercise Prescription - 01/11/22 1600       Date of Initial Exercise RX and Referring Provider   Date 01/11/22    Referring Provider Glori Bickers, MD    Expected Discharge Date 03/09/22      NuStep   Level 2    SPM 75    Minutes 15    METs 2.5      Arm Ergometer   Level 1    RPM 60    Minutes 15    METs 2      Prescription  Details   Frequency (times per week) 3     Duration Progress to 30 minutes of continuous aerobic without signs/symptoms of physical distress      Intensity   THRR 40-80% of Max Heartrate 64-129    Ratings of Perceived Exertion 11-13    Perceived Dyspnea 0-4      Progression   Progression Continue progressive overload as per policy without signs/symptoms or physical distress.      Resistance Training   Training Prescription Yes    Weight 2    Reps 10-15             Perform Capillary Blood Glucose checks as needed.  Exercise Prescription Changes:   Exercise Comments:   Exercise Goals and Review:   Exercise Goals     Row Name 01/11/22 1602             Exercise Goals   Increase Physical Activity Yes       Intervention Develop an individualized exercise prescription for aerobic and resistive training based on initial evaluation findings, risk stratification, comorbidities and participant's personal goals.;Provide advice, education, support and counseling about physical activity/exercise needs.       Expected Outcomes Short Term: Attend rehab on a regular basis to increase amount of physical activity.;Long Term: Add in home exercise to make exercise part of routine and to increase amount of physical activity.;Long Term: Exercising regularly at least 3-5 days a week.       Increase Strength and Stamina Yes       Intervention Provide advice, education, support and counseling about physical activity/exercise needs.;Develop an individualized exercise prescription for aerobic and resistive training based on initial evaluation findings, risk stratification, comorbidities and participant's personal goals.       Expected Outcomes Short Term: Increase workloads from initial exercise prescription for resistance, speed, and METs.;Short Term: Perform resistance training exercises routinely during rehab and add in resistance training at home;Long Term: Improve cardiorespiratory fitness, muscular endurance and strength as  measured by increased METs and functional capacity (6MWT)       Able to understand and use rate of perceived exertion (RPE) scale Yes       Intervention Provide education and explanation on how to use RPE scale       Expected Outcomes Short Term: Able to use RPE daily in rehab to express subjective intensity level;Long Term:  Able to use RPE to guide intensity level when exercising independently       Knowledge and understanding of Target Heart Rate Range (THRR) Yes       Intervention Provide education and explanation of THRR including how the numbers were predicted and where they are located for reference       Expected Outcomes Short Term: Able to state/look up THRR;Short Term: Able to use daily as guideline for intensity in rehab;Long Term: Able to use THRR to govern intensity when exercising independently       Understanding of Exercise Prescription Yes       Intervention Provide education, explanation, and written materials on patient's individual exercise prescription       Expected Outcomes Short Term: Able to explain program exercise prescription;Long Term: Able to explain home exercise prescription to exercise independently                Exercise Goals Re-Evaluation :   Discharge Exercise Prescription (Final Exercise Prescription Changes):   Nutrition:  Target Goals: Understanding of nutrition guidelines, daily intake of sodium '1500mg'$ , cholesterol '200mg'$ , calories  30% from fat and 7% or less from saturated fats, daily to have 5 or more servings of fruits and vegetables.  Biometrics:  Pre Biometrics - 01/11/22 1603       Pre Biometrics   Height '6\' 1"'$  (1.854 m)    Weight 94.9 kg    Waist Circumference 40.75 inches    Hip Circumference 46.5 inches    Waist to Hip Ratio 0.88 %    BMI (Calculated) 27.61    Triceps Skinfold 22 mm    % Body Fat 37.8 %    Grip Strength 31 kg    Flexibility 13.75 in    Single Leg Stand 5.6 seconds              Nutrition Therapy  Plan and Nutrition Goals:   Nutrition Assessments:  MEDIFICTS Score Key: ?70 Need to make dietary changes  40-70 Heart Healthy Diet ? 40 Therapeutic Level Cholesterol Diet    Picture Your Plate Scores: <43 Unhealthy dietary pattern with much room for improvement. 41-50 Dietary pattern unlikely to meet recommendations for good health and room for improvement. 51-60 More healthful dietary pattern, with some room for improvement.  >60 Healthy dietary pattern, although there may be some specific behaviors that could be improved.    Nutrition Goals Re-Evaluation:   Nutrition Goals Re-Evaluation:   Nutrition Goals Discharge (Final Nutrition Goals Re-Evaluation):   Psychosocial: Target Goals: Acknowledge presence or absence of significant depression and/or stress, maximize coping skills, provide positive support system. Participant is able to verbalize types and ability to use techniques and skills needed for reducing stress and depression.  Initial Review & Psychosocial Screening:  Initial Psych Review & Screening - 01/11/22 1630       Initial Review   Current issues with Current Stress Concerns    Source of Stress Concerns Financial    Comments Virjean is out on short term disability which ends soon.  Applied for long term disability. Struggles financially short term disability is not covering household needs and demands      [place]? Yes    Comments Feels supported by family and friends      Barriers   Psychosocial barriers to participate in program The patient should benefit from training in stress management and relaxation.      Screening Interventions   Interventions Encouraged to exercise;Provide feedback about the scores to participant    Expected Outcomes Short Term goal: Identification and review with participant of any Quality of Life or Depression concerns found by scoring the questionnaire.;Long Term Goal: Stressors or current  issues are controlled or eliminated.;Long Term goal: The participant improves quality of Life and PHQ9 Scores as seen by post scores and/or verbalization of changes             Quality of Life Scores:  Quality of Life - 01/11/22 1553       Quality of Life   Select Quality of Life      Quality of Life Scores   Health/Function Pre 21.2 %    Socioeconomic Pre 19.69 %    Psych/Spiritual Pre 24.86 %    Family Pre 27.6 %    GLOBAL Pre 22.5 %            Scores of 19 and below usually indicate a poorer quality of life in these areas.  A difference of  2-3 points is a clinically meaningful difference.  A difference of 2-3 points in the total score  of the Quality of Life Index has been associated with significant improvement in overall quality of life, self-image, physical symptoms, and general health in studies assessing change in quality of life.  PHQ-9: Review Flowsheet  More data exists      01/11/2022 10/29/2017 09/11/2017 08/07/2017 08/01/2016  Depression screen PHQ 2/9  Decreased Interest 0 0 0 0 0  Down, Depressed, Hopeless 1 0 0 0 0  PHQ - 2 Score 1 0 0 0 0  Altered sleeping 0 - - - -  Tired, decreased energy 1 - - - -  Change in appetite 0 - - - -  Feeling bad or failure about yourself  0 - - - -  Trouble concentrating 0 - - - -  Moving slowly or fidgety/restless 0 - - - -  Suicidal thoughts 0 - - - -  PHQ-9 Score 2 - - - -  Difficult doing work/chores Somewhat difficult - - - -   Interpretation of Total Score  Total Score Depression Severity:  1-4 = Minimal depression, 5-9 = Mild depression, 10-14 = Moderate depression, 15-19 = Moderately severe depression, 20-27 = Severe depression   Psychosocial Evaluation and Intervention:   Psychosocial Re-Evaluation:   Psychosocial Discharge (Final Psychosocial Re-Evaluation):   Vocational Rehabilitation: Provide vocational rehab assistance to qualifying candidates.   Vocational Rehab Evaluation & Intervention:   Vocational Rehab - 01/11/22 1632       Initial Vocational Rehab Evaluation & Intervention   Assessment shows need for Vocational Rehabilitation No             Education: Education Goals: Education classes will be provided on a weekly basis, covering required topics. Participant will state understanding/return demonstration of topics presented.     Core Videos: Exercise    Move It!  Clinical staff conducted group or individual video education with verbal and written material and guidebook.  Patient learns the recommended Pritikin exercise program. Exercise with the goal of living a long, healthy life. Some of the health benefits of exercise include controlled diabetes, healthier blood pressure levels, improved cholesterol levels, improved heart and lung capacity, improved sleep, and better body composition. Everyone should speak with their doctor before starting or changing an exercise routine.  Biomechanical Limitations Clinical staff conducted group or individual video education with verbal and written material and guidebook.  Patient learns how biomechanical limitations can impact exercise and how we can mitigate and possibly overcome limitations to have an impactful and balanced exercise routine.  Body Composition Clinical staff conducted group or individual video education with verbal and written material and guidebook.  Patient learns that body composition (ratio of muscle mass to fat mass) is a key component to assessing overall fitness, rather than body weight alone. Increased fat mass, especially visceral belly fat, can put [place] at increased risk for metabolic syndrome, type 2 diabetes, heart disease, and even death. It is recommended to combine diet and exercise (cardiovascular and resistance training) to improve your body composition. Seek guidance from your physician and exercise physiologist before implementing an exercise routine.  Exercise Action Plan Clinical staff  conducted group or individual video education with verbal and written material and guidebook.  Patient learns the recommended strategies to achieve and enjoy long-term exercise adherence, including variety, self-motivation, self-efficacy, and positive decision making. Benefits of exercise include fitness, good health, weight management, more energy, better sleep, less stress, and overall well-being.  Medical   Heart Disease Risk Reduction Clinical staff conducted group or individual video education with  verbal and written material and guidebook.  Patient learns our heart is our most vital organ as it circulates oxygen, nutrients, white blood cells, and hormones throughout the entire body, and carries waste away. Data supports a plant-based eating plan like the Pritikin Program for its effectiveness in slowing progression of and reversing heart disease. The video provides a number of recommendations to address heart disease.   Metabolic Syndrome and Belly Fat  Clinical staff conducted group or individual video education with verbal and written material and guidebook.  Patient learns what metabolic syndrome is, how it leads to heart disease, and how one can reverse it and keep it from coming back. You have metabolic syndrome if you have 3 of the following 5 criteria: abdominal obesity, high blood pressure, high triglycerides, low HDL cholesterol, and high blood sugar.  Hypertension and Heart Disease Clinical staff conducted group or individual video education with verbal and written material and guidebook.  Patient learns that high blood pressure, or hypertension, is very common in the [place]. Hypertension is largely due to excessive salt intake, but other important risk factors include being overweight, physical inactivity, drinking too much alcohol, smoking, and not eating enough potassium from fruits and vegetables. High blood pressure is a leading risk factor for heart attack, stroke,  congestive heart failure, dementia, kidney failure, and premature death. Long-term effects of excessive salt intake include stiffening of the arteries and thickening of heart muscle and organ damage. Recommendations include ways to reduce hypertension and the risk of heart disease.  Diseases of Our Time - Focusing on Diabetes Clinical staff conducted group or individual video education with verbal and written material and guidebook.  Patient learns why the best way to stop diseases of our time is prevention, through food and other lifestyle changes. Medicine (such as prescription pills and surgeries) is often only a Band-Aid on the problem, not a long-term solution. Most common diseases of our time include obesity, type 2 diabetes, hypertension, heart disease, and cancer. The Pritikin Program is recommended and has been proven to help reduce, reverse, and/or prevent the damaging effects of metabolic syndrome.  Nutrition   Overview of the Pritikin Eating Plan  Clinical staff conducted group or individual video education with verbal and written material and guidebook.  Patient learns about the [place] for disease risk reduction. The [place] emphasizes a wide variety of unrefined, minimally-processed carbohydrates, like fruits, vegetables, whole grains, and legumes. Go, Caution, and Stop food choices are explained. Plant-based and lean animal proteins are emphasized. Rationale provided for low sodium intake for blood pressure control, low added sugars for blood sugar stabilization, and low added fats and oils for coronary artery disease risk reduction and weight management.  Calorie Density  Clinical staff conducted group or individual video education with verbal and written material and guidebook.  Patient learns about calorie density and how it impacts the Pritikin Eating Plan. Knowing the characteristics of the food you choose will help you decide whether those foods will lead  to weight gain or weight loss, and whether you want to consume more or less of them. Weight loss is usually a side effect of the Pritikin Eating Plan because of its focus on low calorie-dense foods.  Label Reading  Clinical staff conducted group or individual video education with verbal and written material and guidebook.  Patient learns about the Pritikin recommended label reading guidelines and corresponding recommendations regarding calorie density, added sugars, sodium content, and whole grains.  Dining Out -  Part 1  Clinical staff conducted group or individual video education with verbal and written material and guidebook.  Patient learns that restaurant meals can be sabotaging because they can be so high in calories, fat, sodium, and/or sugar. Patient learns recommended strategies on how to positively address this and avoid unhealthy pitfalls.  Facts on Fats  Clinical staff conducted group or individual video education with verbal and written material and guidebook.  Patient learns that lifestyle modifications can be just as effective, if not more so, as many medications for lowering your risk of heart disease. A Pritikin lifestyle can help to reduce your risk of inflammation and atherosclerosis (cholesterol build-up, or plaque, in the artery walls). Lifestyle interventions such as dietary choices and physical activity address the cause of atherosclerosis. A review of the types of fats and their impact on blood cholesterol levels, along with dietary recommendations to reduce fat intake is also included.  Nutrition Action Plan  Clinical staff conducted group or individual video education with verbal and written material and guidebook.  Patient learns how to incorporate Pritikin recommendations into their lifestyle. Recommendations include planning and keeping personal health goals in mind as an important part of their success.  Healthy Mind-Set    Healthy Minds, Bodies, Hearts  Clinical  staff conducted group or individual video education with verbal and written material and guidebook.  Patient learns how to identify when they are stressed. Video will discuss the impact of that stress, as well as the many benefits of stress management. Patient will also be introduced to stress management techniques. The way we think, act, and feel has an impact on our hearts.  How Our Thoughts Can Heal Our Hearts  Clinical staff conducted group or individual video education with verbal and written material and guidebook.  Patient learns that negative thoughts can cause depression and anxiety. This can result in negative lifestyle behavior and serious health problems. Cognitive behavioral therapy is an effective method to help control our thoughts in order to change and improve our emotional outlook.  Additional Videos:  Exercise    Improving Performance  Clinical staff conducted group or individual video education with verbal and written material and guidebook.  Patient learns to use a non-linear approach by alternating intensity levels and lengths of time spent exercising to help burn more calories and lose more body fat. Cardiovascular exercise helps improve heart health, metabolism, hormonal balance, blood sugar control, and recovery from fatigue. Resistance training improves strength, endurance, balance, coordination, reaction time, metabolism, and muscle mass. Flexibility exercise improves circulation, posture, and balance. Seek guidance from your physician and exercise physiologist before implementing an exercise routine and learn your capabilities and proper form for all exercise.  Introduction to Yoga  Clinical staff conducted group or individual video education with verbal and written material and guidebook.  Patient learns about yoga, a discipline of the coming together of mind, breath, and body. The benefits of yoga include improved flexibility, improved range of motion, better posture and  core strength, increased lung function, weight loss, and positive self-image. Yoga's heart health benefits include lowered blood pressure, healthier heart rate, decreased cholesterol and triglyceride levels, improved immune function, and reduced stress. Seek guidance from your physician and exercise physiologist before implementing an exercise routine and learn your capabilities and proper form for all exercise.  Medical   Aging: Enhancing Your Quality of Life  Clinical staff conducted group or individual video education with verbal and written material and guidebook.  Patient learns key strategies and  recommendations to stay in good physical health and enhance quality of life, such as prevention strategies, having an advocate, securing a [place], and keeping a list of medications and system for tracking them. It also discusses how to avoid risk for bone loss.  Biology of Weight Control  Clinical staff conducted group or individual video education with verbal and written material and guidebook.  Patient learns that weight gain occurs because we consume more calories than we burn (eating more, moving less). Even if your body weight is normal, you may have higher ratios of fat compared to muscle mass. Too much body fat puts you at increased risk for cardiovascular disease, heart attack, stroke, type 2 diabetes, and obesity-related cancers. In addition to exercise, following the [place] can help reduce your risk.  Decoding Lab Results  Clinical staff conducted group or individual video education with verbal and written material and guidebook.  Patient learns that lab test reflects one measurement whose values change over time and are influenced by many factors, including medication, stress, sleep, exercise, food, hydration, pre-existing medical conditions, and more. It is recommended to use the knowledge from this video to become more involved with your lab  results and evaluate your numbers to speak with your doctor.   Diseases of Our Time - Overview  Clinical staff conducted group or individual video education with verbal and written material and guidebook.  Patient learns that according to the CDC, 50% to 70% of chronic diseases (such as obesity, type 2 diabetes, elevated lipids, hypertension, and heart disease) are avoidable through lifestyle improvements including healthier food choices, listening to satiety cues, and increased physical activity.  Sleep Disorders Clinical staff conducted group or individual video education with verbal and written material and guidebook.  Patient learns how good quality and duration of sleep are important to overall health and well-being. Patient also learns about sleep disorders and how they impact health along with recommendations to address them, including discussing with a physician.  Nutrition  Dining Out - Part 2 Clinical staff conducted group or individual video education with verbal and written material and guidebook.  Patient learns how to plan ahead and communicate in order to maximize their dining experience in a healthy and nutritious manner. Included are recommended food choices based on the type of restaurant the patient is visiting.   Fueling a Best boy conducted group or individual video education with verbal and written material and guidebook.  There is a strong connection between our food choices and our health. Diseases like obesity and type 2 diabetes are very prevalent and are in large-part due to lifestyle choices. The Pritikin Eating Plan provides plenty of food and hunger-curbing satisfaction. It is easy to follow, affordable, and helps reduce health risks.  Menu Workshop  Clinical staff conducted group or individual video education with verbal and written material and guidebook.  Patient learns that restaurant meals can sabotage health goals because they are often  packed with calories, fat, sodium, and sugar. Recommendations include strategies to plan ahead and to communicate with the manager, chef, or server to help order a healthier meal.  Planning Your Eating Strategy  Clinical staff conducted group or individual video education with verbal and written material and guidebook.  Patient learns about the [place] and its benefit of reducing the risk of disease. The [place] does not focus on calories. Instead, it emphasizes high-quality, nutrient-rich foods. By knowing the characteristics  of the foods, we choose, we can determine their calorie density and make informed decisions.  Targeting Your Nutrition Priorities  Clinical staff conducted group or individual video education with verbal and written material and guidebook.  Patient learns that lifestyle habits have a tremendous impact on disease risk and progression. This video provides eating and physical activity recommendations based on your personal health goals, such as reducing LDL cholesterol, losing weight, preventing or controlling type 2 diabetes, and reducing high blood pressure.  Vitamins and Minerals  Clinical staff conducted group or individual video education with verbal and written material and guidebook.  Patient learns different ways to obtain key vitamins and minerals, including through a recommended healthy diet. It is important to discuss all supplements you take with your doctor.   Healthy Mind-Set    Smoking Cessation  Clinical staff conducted group or individual video education with verbal and written material and guidebook.  Patient learns that cigarette smoking and tobacco addiction pose a serious health risk which affects millions of people. Stopping smoking will significantly reduce the risk of heart disease, lung disease, and many forms of cancer. Recommended strategies for quitting are covered, including working with your doctor to develop a successful  plan.  Culinary   Becoming a Financial trader conducted group or individual video education with verbal and written material and guidebook.  Patient learns that cooking at home can be healthy, cost-effective, quick, and puts them in control. Keys to cooking healthy recipes will include looking at your recipe, assessing your equipment needs, planning ahead, making it simple, choosing cost-effective seasonal ingredients, and limiting the use of added fats, salts, and sugars.  Cooking - Breakfast and Snacks  Clinical staff conducted group or individual video education with verbal and written material and guidebook.  Patient learns how important breakfast is to satiety and nutrition through the entire day. Recommendations include key foods to eat during breakfast to help stabilize blood sugar levels and to prevent overeating at meals later in the day. Planning ahead is also a key component.  Cooking - Human resources officer conducted group or individual video education with verbal and written material and guidebook.  Patient learns eating strategies to improve overall health, including an approach to cook more at home. Recommendations include thinking of animal protein as a side on your plate rather than center stage and focusing instead on lower calorie dense options like vegetables, fruits, whole grains, and plant-based proteins, such as beans. Making sauces in large quantities to freeze for later and leaving the skin on your vegetables are also recommended to maximize your experience.  Cooking - Healthy Salads and Dressing Clinical staff conducted group or individual video education with verbal and written material and guidebook.  Patient learns that vegetables, fruits, whole grains, and legumes are the foundations of the [place]. Recommendations include how to incorporate each of these in flavorful and healthy salads, and how to create homemade salad dressings.  Proper handling of ingredients is also covered. Cooking - Soups and [employer] - Soups and Desserts Clinical staff conducted group or individual video education with verbal and written material and guidebook.  Patient learns that Pritikin soups and desserts make for easy, nutritious, and delicious snacks and meal components that are low in sodium, fat, sugar, and calorie density, while high in vitamins, minerals, and filling fiber. Recommendations include simple and healthy ideas for soups and desserts.   Overview     The Pritikin Solution  Program Overview Clinical staff conducted group or individual video education with verbal and written material and guidebook.  Patient learns that the results of the [place] Program have been documented in more than 100 articles published in peer-reviewed journals, and the benefits include reducing risk factors for (and, in some cases, even reversing) high cholesterol, high blood pressure, type 2 diabetes, obesity, and more! An overview of the three key pillars of the Pritikin Program will be covered: eating well, doing regular exercise, and having a healthy mind-set.  WORKSHOPS  Exercise: Exercise Basics: Building Your Action Plan Clinical staff led group instruction and group discussion with PowerPoint presentation and patient guidebook. To enhance the learning environment the use of posters, models and videos may be added. At the conclusion of this workshop, patients will comprehend the difference between physical activity and exercise, as well as the benefits of incorporating both, into their routine. Patients will understand the FITT (Frequency, Intensity, Time, and Type) principle and how to use it to build an exercise action plan. In addition, safety concerns and other considerations for exercise and cardiac rehab will be addressed by the presenter. The purpose of this lesson is to promote a comprehensive and effective weekly exercise routine in  order to improve patients' overall level of fitness.   Managing Heart Disease: Your Path to a Healthier Heart Clinical staff led group instruction and group discussion with PowerPoint presentation and patient guidebook. To enhance the learning environment the use of posters, models and videos may be added.At the conclusion of this workshop, patients will understand the anatomy and physiology of the heart. Additionally, they will understand how Pritikin's three pillars impact the risk factors, the progression, and the management of heart disease.  The purpose of this lesson is to provide a high-level overview of the heart, heart disease, and how the Pritikin lifestyle positively impacts risk factors.  Exercise Biomechanics Clinical staff led group instruction and group discussion with PowerPoint presentation and patient guidebook. To enhance the learning environment the use of posters, models and videos may be added. Patients will learn how the structural parts of their bodies function and how these functions impact their daily activities, movement, and exercise. Patients will learn how to promote a neutral spine, learn how to manage pain, and identify ways to improve their physical movement in order to promote healthy living. The purpose of this lesson is to expose patients to common physical limitations that impact physical activity. Participants will learn practical ways to adapt and manage aches and pains, and to minimize their effect on regular exercise. Patients will learn how to maintain good posture while sitting, walking, and lifting.  Balance Training and Fall Prevention  Clinical staff led group instruction and group discussion with PowerPoint presentation and patient guidebook. To enhance the learning environment the use of posters, models and videos may be added. At the conclusion of this workshop, patients will understand the importance of their sensorimotor skills (vision,  proprioception, and the vestibular system) in maintaining their ability to balance as they age. Patients will apply a variety of balancing exercises that are appropriate for their current level of function. Patients will understand the common causes for poor balance, possible solutions to these problems, and ways to modify their physical environment in order to minimize their fall risk. The purpose of this lesson is to teach patients about the importance of maintaining balance as they age and ways to minimize their risk of falling.  WORKSHOPS   Nutrition:  Fueling a Designer, multimedia  Clinical staff led group instruction and group discussion with PowerPoint presentation and patient guidebook. To enhance the learning environment the use of posters, models and videos may be added. Patients will review the foundational principles of the [place] and understand what constitutes a serving size in each of the food groups. Patients will also learn Pritikin-friendly foods that are better choices when away from home and review make-ahead meal and snack options. Calorie density will be reviewed and applied to three nutrition priorities: weight maintenance, weight loss, and weight gain. The purpose of this lesson is to reinforce (in a group setting) the key concepts around what patients are recommended to eat and how to apply these guidelines when away from home by planning and selecting Pritikin-friendly options. Patients will understand how calorie density may be adjusted for different weight management goals.  Mindful Eating  Clinical staff led group instruction and group discussion with PowerPoint presentation and patient guidebook. To enhance the learning environment the use of posters, models and videos may be added. Patients will briefly review the concepts of the [place] and the importance of low-calorie dense foods. The concept of mindful eating will be introduced as well as the  importance of paying attention to internal hunger signals. Triggers for non-hunger eating and techniques for dealing with triggers will be explored. The purpose of this lesson is to provide patients with the opportunity to review the basic principles of the [place]Fela Solis, discuss the value of eating mindfully and how to measure internal cues of hunger and fullness using the Hunger Scale. Patients will also discuss reasons for non-hunger eating and learn strategies to use for controlling emotional eating.  Targeting Your Nutrition Priorities Clinical staff led group instruction and group discussion with PowerPoint presentation and patient guidebook. To enhance the learning environment the use of posters, models and videos may be added. Patients will learn how to determine their genetic susceptibility to disease by reviewing their family history. Patients will gain insight into the importance of diet as part of an overall healthy lifestyle in mitigating the impact of genetics and other environmental insults. The purpose of this lesson is to provide patients with the opportunity to assess their personal nutrition priorities by looking at their family history, their own health history and current risk factors. Patients will also be able to discuss ways of prioritizing and modifying the [place] for their highest risk areas  Menu  Clinical staff led group instruction and group discussion with PowerPoint presentation and patient guidebook. To enhance the learning environment the use of posters, models and videos may be added. Using menus brought in from [employer], or printed from [employer], patients will apply the [place] dining out guidelines that were presented in the [employer] video. Patients will also be able to practice these guidelines in a variety of provided scenarios. The purpose of this lesson is to provide patients with the opportunity to practice  hands-on learning of the [place] with actual menus and practice scenarios.  Label Reading Clinical staff led group instruction and group discussion with PowerPoint presentation and patient guidebook. To enhance the learning environment the use of posters, models and videos may be added. Patients will review and discuss the Pritikin label reading guidelines presented in Pritikin's Label Reading Educational series video. Using fool labels brought in from local grocery stores and markets, patients will apply the label reading guidelines and determine if the packaged food meet the Pritikin  guidelines. The purpose of this lesson is to provide patients with the opportunity to review, discuss, and practice hands-on learning of the Pritikin Label Reading guidelines with actual packaged food labels. [place] Workshops are designed to teach patients ways to prepare quick, simple, and affordable recipes at home. The importance of nutrition's role in chronic disease risk reduction is reflected in its emphasis in the overall Pritikin program. By learning how to prepare essential core Pritikin Eating Plan recipes, patients will increase control over what they eat; be able to customize the flavor of foods without the use of added salt, sugar, or fat; and improve the quality of the food they consume. By learning a set of core recipes which are easily assembled, quickly prepared, and affordable, patients are more likely to prepare more healthy foods at home. These workshops focus on convenient breakfasts, simple entres, side dishes, and desserts which can be prepared with minimal effort and are consistent with nutrition recommendations for cardiovascular risk reduction. Cooking [employer] are taught by a Engineer, materials (RD) who has been trained by the [employer]. The chef or RD has a clear understanding of the importance of minimizing -  if not completely eliminating - added fat, sugar, and sodium in recipes. Throughout the series of [place] Workshop sessions, patients will learn about healthy ingredients and efficient methods of cooking to build confidence in their capability to prepare    Cooking School weekly topics:  Adding Flavor- Sodium-Free  Fast and Healthy Breakfasts  Powerhouse Plant-Based Proteins  Satisfying Salads and Dressings  Simple Sides and Sauces  International Cuisine-Spotlight on the [place] Zones  Delicious Desserts  Savory Soups  Efficiency Cooking - Meals in a Snap  Tasty Appetizers and Snacks  Comforting Weekend Breakfasts  One-Pot Wonders   Fast Evening Meals  Easy [place] (Psychosocial): New Thoughts, New Behaviors Clinical staff led group instruction and group discussion with PowerPoint presentation and patient guidebook. To enhance the learning environment the use of posters, models and videos may be added. Patients will learn and practice techniques for developing effective health and lifestyle goals. Patients will be able to effectively apply the goal setting process learned to develop at least one new personal goal.  The purpose of this lesson is to expose patients to a new skill set of behavior modification techniques such as techniques setting SMART goals, overcoming barriers, and achieving new thoughts and new behaviors.  Managing Moods and Relationships Clinical staff led group instruction and group discussion with PowerPoint presentation and patient guidebook. To enhance the learning environment the use of posters, models and videos may be added. Patients will learn how emotional and chronic stress factors can impact their health and relationships. They will learn healthy ways to manage their moods and utilize positive coping mechanisms. In addition, ICR patients will learn ways to improve communication skills.  The purpose of this lesson is to expose patients to ways of understanding how one's mood and health are intimately connected. Developing a healthy outlook can help build positive relationships and connections with others. Patients will understand the importance of utilizing effective communication skills that include actively listening and being heard. They will learn and understand the importance of the "4 Cs" and especially Connections in fostering of a Healthy Mind-Set.  Healthy Sleep for a Healthy Heart Clinical staff led group instruction and group discussion with PowerPoint presentation and patient guidebook. To  enhance the learning environment the use of posters, models and videos may be added. At the conclusion of this workshop, patients will be able to demonstrate knowledge of the importance of sleep to overall health, well-being, and quality of life. They will understand the symptoms of, and treatments for, common sleep disorders. Patients will also be able to identify daytime and nighttime behaviors which impact sleep, and they will be able to apply these tools to help manage sleep-related challenges. The purpose of this lesson is to provide patients with a general overview of sleep and outline the importance of quality sleep. Patients will learn about a few of the most common sleep disorders. Patients will also be introduced to the concept of "sleep hygiene," and discover ways to self-manage certain sleeping problems through simple daily behavior changes. Finally, the workshop will motivate patients by clarifying the links between quality sleep and their goals of heart-healthy living.   Recognizing and Reducing Stress Clinical staff led group instruction and group discussion with PowerPoint presentation and patient guidebook. To enhance the learning environment the use of posters, models and videos may be added. At the conclusion of this workshop, patients will be able to understand the types of  stress reactions, differentiate between acute and chronic stress, and recognize the impact that chronic stress has on their health. They will also be able to apply different coping mechanisms, such as reframing negative self-talk. Patients will have the opportunity to practice a variety of stress management techniques, such as deep abdominal breathing, progressive muscle relaxation, and/or guided imagery.  The purpose of this lesson is to educate patients on the role of stress in their lives and to provide healthy techniques for coping with it.  Learning Barriers/Preferences:  Learning Barriers/Preferences - 01/11/22 1605       Learning Barriers/Preferences   Learning Barriers Sight;Exercise Concerns   pt has occasional dizziness with position changes   Learning Preferences Written Material;Audio;Computer/Internet;Group Instruction;Individual Instruction;Pictoral;Skilled Demonstration;Verbal Instruction;Video             Education Topics:  Knowledge Questionnaire Score:  Knowledge Questionnaire Score - 01/11/22 1557       Knowledge Questionnaire Score   Pre Score 15/24             Core Components/Risk Factors/Patient Goals at Admission:  Personal Goals and Risk Factors at Admission - 01/11/22 1607       Core Components/Risk Factors/Patient Goals on Admission   Heart Failure Yes    Intervention Provide a combined exercise and nutrition program that is supplemented with education, support and counseling about heart failure. Directed toward relieving symptoms such as shortness of breath, decreased exercise tolerance, and extremity edema.    Expected Outcomes Long term: Adoption of self-care skills and reduction of barriers for early signs and symptoms recognition and intervention leading to self-care maintenance.;Short term: Daily weights obtained and reported for increase. Utilizing diuretic protocols set by physician.;Short term: Attendance in program 2-3 days a week with  increased exercise capacity. Reported lower sodium intake. Reported increased fruit and vegetable intake. Reports medication compliance.;Improve functional capacity of life    Hypertension Yes    Intervention Provide education on lifestyle modifcations including regular physical activity/exercise, weight management, moderate sodium restriction and increased consumption of fresh fruit, vegetables, and low fat dairy, alcohol moderation, and smoking cessation.;Monitor prescription use compliance.    Expected Outcomes Short Term: Continued assessment and intervention until BP is < 140/78m HG in hypertensive participants. < 130/87mHG in hypertensive participants with diabetes, heart failure  or chronic kidney disease.;Long Term: Maintenance of blood pressure at goal levels.    Lipids Yes    Intervention Provide education and support for participant on nutrition & aerobic/resistive exercise along with prescribed medications to achieve LDL '70mg'$ , HDL >'40mg'$ .    Expected Outcomes Long Term: Cholesterol controlled with medications as prescribed, with individualized exercise RX and with personalized nutrition plan. Value goals: LDL < '70mg'$ , HDL > 40 mg.;Short Term: Participant states understanding of desired cholesterol values and is compliant with medications prescribed. Participant is following exercise prescription and nutrition guidelines.    Stress Yes    Intervention Offer individual and/or small group education and counseling on adjustment to heart disease, stress management and health-related lifestyle change. Teach and support self-help strategies.;Refer participants experiencing significant psychosocial distress to appropriate mental health specialists for further evaluation and treatment. When possible, include family members and significant others in education/counseling sessions.    Expected Outcomes Short Term: Participant demonstrates changes in health-related behavior, relaxation and other stress  management skills, ability to obtain effective social support, and compliance with psychotropic medications if prescribed.;Long Term: Emotional wellbeing is indicated by absence of clinically significant psychosocial distress or social isolation.    Personal Goal Other Yes    Personal Goal short term: upper body strength. long term: decrease gut size, tone core.    Intervention will continue to monitor pt and progress workloads as tolerated without s/s.    Expected Outcomes pt will achieve their goals.             Core Components/Risk Factors/Patient Goals Review:    Core Components/Risk Factors/Patient Goals at Discharge (Final Review):    ITP Comments:  ITP Comments     Row Name 01/11/22 1629           ITP Comments Medical Director - Dr. Fransico Him                Comments: Participant attended orientation for the cardiac rehabilitation program on  01/11/2022 to perform initial intake and exercise walk test. Patient introduced to the [place] education and orientation packet was reviewed. Completed 6-minute walk test, measurements, initial ITP, and exercise prescription. Vital signs stable. Telemetry-normal sinus rhythm with BBB rare PVC.    Service time was from 1340 to 1615.

## 2022-01-11 NOTE — Progress Notes (Signed)
Cardiac Rehab Medication Review   Does the patient  feel that his/her medications are working for him/her?  yes  Has the patient been experiencing any side effects to the medications prescribed?  no  Does the patient measure his/her own blood pressure or blood glucose at home?  yes   Does the patient have any problems obtaining medications due to transportation or finances?   Yes gets assistance through the heart failure clinic - working with SW  Understanding of regimen: good Understanding of indications: good Potential of compliance: excellent    Lynn Estrada Ruben Im RN 01/11/2022 4:49 PM

## 2022-01-15 ENCOUNTER — Encounter (HOSPITAL_COMMUNITY)
Admission: RE | Admit: 2022-01-15 | Discharge: 2022-01-15 | Disposition: A | Discharge status: Home / Self Care | Payer: BC Managed Care – PPO | Source: Ambulatory Visit | Attending: Internal Medicine | Admitting: Internal Medicine

## 2022-01-15 DIAGNOSIS — I5042 Chronic combined systolic (congestive) and diastolic (congestive) heart failure: Secondary | ICD-10-CM

## 2022-01-15 NOTE — Progress Notes (Signed)
Daily Session    Patient Details  Name: Lynn Estrada MRN: [id number] Date of Birth: 09-Aug-1960 Referring Provider:   Flowsheet Row INTENSIVE CARDIAC REHAB ORIENT from 01/11/2022 in [place]  Referring Provider Glori Bickers, MD       Encounter Date: 01/15/2022  Check In:  Session Check In - 01/15/22 7703       Check-In   Supervising physician immediately available to respond to emergencies [facility] immediately available    Physician(s) Dr. Alfredia Ferguson    Location [facility]    Staff Present Maurice Small, RN, BSN;Jetta Walker BS, ACSM EP-C, Exercise Physiologist;Other;Maria Whitaker, RN, Deland Pretty, MS, ACSM CEP, Exercise Physiologist;David Lilyan Punt, MS, ACSM-CEP, CCRP, Exercise Physiologist    Virtual Visit No    Medication changes reported     No    Fall or balance concerns reported    No    Tobacco Cessation No Change    Warm-up and Cool-down Performed as group-led instruction    Resistance Training Performed Yes    VAD Patient? No    PAD/SET Patient? No      Pain Assessment   Currently in Pain? No/denies    Pain Score 0-No pain    Multiple Pain Sites No             Capillary Blood Glucose: No results found for this or any previous visit (from the past 24 hour(s)).   Exercise Prescription Changes - 01/15/22 1400       Response to Exercise   Blood Pressure (Admit) 108/72    Blood Pressure (Exercise) 114/80    Blood Pressure (Exit) 100/68    Heart Rate (Admit) 85 bpm    Heart Rate (Exercise) 107 bpm    Heart Rate (Exit) 93 bpm    Rating of Perceived Exertion (Exercise) 11    Symptoms None    Comments Pt's first day in the [facility] program.    Duration Continue with 30 min of aerobic exercise without signs/symptoms of physical distress.    Intensity THRR unchanged      Progression   Progression Continue to progress workloads to maintain intensity without signs/symptoms of physical  distress.    Average METs 1.4      Resistance Training   Training Prescription Yes    Weight 2    Reps 10-15    Time 10 Minutes      Interval Training   Interval Training No      NuStep   Level 2    SPM 57    Minutes 15    METs 1.5      Arm Ergometer   Level 1    Watts 1    RPM 43    Minutes 15    METs 1.05             Social History   Tobacco Use  Smoking Status Former   Packs/day: 0.75   Years: 5.00   Total pack years: 3.75   Types: Cigarettes   Quit date: 01/10/1997   Years since quitting: 25.0  Smokeless Tobacco Never    Goals Met:  Exercise tolerated well No report of concerns or symptoms today Strength training completed today  Goals Unmet:  Not Applicable  Comments: Pt started cardiac rehab today.  Pt tolerated light exercise without difficulty. VSS, telemetry-NSR BBB, asymptomatic.  Medication list reconciled. Pt denies barriers to medicaiton compliance.  PSYCHOSOCIAL ASSESSMENT:  PHQ 2. Pt exhibits positive coping skills,  hopeful outlook with supportive family. No psychosocial needs identified at this time, no psychosocial interventions necessary.  Pt enjoys playing games and traveling. Pt oriented to exercise equipment and routine. Understanding verbalized.  Albertine Grates RN 12/31/15/23 1130    Dr. Fransico Him is Medical Director for Cardiac Rehab at [facility].

## 2022-01-16 ENCOUNTER — Encounter (HOSPITAL_COMMUNITY): Payer: Self-pay | Admitting: *Deleted

## 2022-01-16 NOTE — Progress Notes (Signed)
Per fax request  and pt's request, last 2 ov notes faxed into [place] at [phone]

## 2022-01-17 ENCOUNTER — Encounter (HOSPITAL_COMMUNITY)
Admission: RE | Admit: 2022-01-17 | Discharge: 2022-01-17 | Disposition: A | Discharge status: Home / Self Care | Payer: BC Managed Care – PPO | Source: Ambulatory Visit | Attending: Internal Medicine | Admitting: Internal Medicine

## 2022-01-17 DIAGNOSIS — I5042 Chronic combined systolic (congestive) and diastolic (congestive) heart failure: Secondary | ICD-10-CM | POA: Diagnosis not present

## 2022-01-17 NOTE — Progress Notes (Signed)
Patient reports good family support,  she states some increased stress due to 2 adult daughters moving back into family home, patient denies depression, patient is motivated to make necessary changes to achieve an optimal lifestyle.

## 2022-01-19 ENCOUNTER — Encounter (HOSPITAL_COMMUNITY): Payer: BC Managed Care – PPO

## 2022-01-21 DIAGNOSIS — E785 Hyperlipidemia, unspecified: Secondary | ICD-10-CM | POA: Insufficient documentation

## 2022-01-21 DIAGNOSIS — E781 Pure hyperglyceridemia: Secondary | ICD-10-CM | POA: Insufficient documentation

## 2022-01-22 ENCOUNTER — Encounter (HOSPITAL_COMMUNITY)
Admission: RE | Admit: 2022-01-22 | Discharge: 2022-01-22 | Disposition: A | Discharge status: Home / Self Care | Payer: BC Managed Care – PPO | Source: Ambulatory Visit | Attending: Internal Medicine | Admitting: Internal Medicine

## 2022-01-22 DIAGNOSIS — E1165 Type 2 diabetes mellitus with hyperglycemia: Secondary | ICD-10-CM | POA: Insufficient documentation

## 2022-01-22 DIAGNOSIS — I5042 Chronic combined systolic (congestive) and diastolic (congestive) heart failure: Secondary | ICD-10-CM | POA: Diagnosis not present

## 2022-01-23 NOTE — Progress Notes (Signed)
Cardiac Individual Treatment Plan  Patient Details  Name: Lynn Estrada MRN: [id number] Date of Birth: May 02, 1961 Referring Provider:   Flowsheet Row INTENSIVE CARDIAC REHAB ORIENT from 01/11/2022 in [place]  Referring Provider Glori Bickers, MD       Initial Encounter Date:  [place] from 01/11/2022 in [place]  Date 01/11/22       Visit Diagnosis: Chronic combined systolic and diastolic heart failure ([place])  Patient's Home Medications on Admission:  Current Outpatient Medications:    acetaminophen (TYLENOL) 500 MG tablet, Take 500-1,000 mg by mouth every 6 (six) hours as needed (for pain.)., Disp: , Rfl:    aspirin 81 MG EC tablet, Take 81 mg by mouth daily with lunch., Disp: , Rfl:    atorvastatin (LIPITOR) 10 MG tablet, Take 10 mg by mouth daily., Disp: , Rfl:    cyclobenzaprine (FLEXERIL) 5 MG tablet, Take 5-10 mg by mouth 3 (three) times daily as needed for spasms., Disp: , Rfl:    digoxin (LANOXIN) 0.125 MG tablet, Take 1 tablet (0.125 mg total) by mouth daily. (Patient taking differently: Take 0.125 mg by mouth daily with lunch.), Disp: 30 tablet, Rfl: 2   empagliflozin (JARDIANCE) 10 MG TABS tablet, Take 1 tablet (10 mg total) by mouth daily. (Patient taking differently: Take 10 mg by mouth daily with lunch.), Disp: 30 tablet, Rfl: 2   furosemide (LASIX) 40 MG tablet, Take 1 tablet (40 mg total) by mouth daily as needed. (Patient taking differently: Take 40 mg by mouth daily with lunch.), Disp: 30 tablet, Rfl: 5   HYDROcodone-acetaminophen (NORCO) 10-325 MG tablet, Take 0.5 tablets by mouth every 6 (six) hours as needed for pain., Disp: , Rfl:    Multiple Vitamin (MULTIVITAMIN WITH MINERALS) TABS tablet, Take 1 tablet by mouth daily with lunch. One A Day for Women 50+, Disp: , Rfl:    sacubitril-valsartan (ENTRESTO) 24-26 MG, Take 1 tablet by mouth 2 (two) times  daily., Disp: 60 tablet, Rfl: 11   spironolactone (ALDACTONE) 25 MG tablet, Take 1 tablet (25 mg total) by mouth daily. (Patient taking differently: Take 25 mg by mouth daily with lunch.), Disp: 30 tablet, Rfl: 5  Past Medical History: Past Medical History:  Diagnosis Date   Anemia    Eczema    GERD (gastroesophageal reflux disease)    History of adenomatous polyp of colon    08-03-2016  tubular adenoma x2 and hyperplastic   History of chronic gastritis 08/03/2016   History of ectopic pregnancy 1988   s/p  left salpingectomy   History of endometriosis    History of kidney stones    History of partial nephrectomy    right pelvis for very large stone   History of pneumonia 07/02/2016   CAP   History of sepsis    07-01-2016  sepsis w/ pyelonephritis, CAP /   12-31-2016  urosepsis w/ kidney stone obstruction   History of uterine leiomyoma    Hypertension    Left ureteral stone    Nephrolithiasis    left obstructive stone and right non-obstructive stone  per CT 12-31-2016   Urgency of urination     Tobacco Use: Social History   Tobacco Use  Smoking Status Former   Packs/day: 0.75   Years: 5.00   Total pack years: 3.75   Types: Cigarettes   Quit date: 01/10/1997   Years since quitting: 25.0  Smokeless Tobacco Never  Labs: Review Flowsheet       Latest Ref Rng & Units 07/26/2015 02/01/2017 07/14/2021 07/18/2021  Labs for ITP Cardiac and Pulmonary Rehab  Cholestrol 0 - 200 mg/dL 195  - 121  -  LDL (calc) 0 - 99 mg/dL 131  - 70  -  HDL-C >40 mg/dL 32  - 32  -  Trlycerides <150 mg/dL 161  - 95  -  Hemoglobin A1c 4.8 - 5.6 % - - 6.2  -  PH, Arterial 7.350 - 7.450 - - - 7.391   PCO2 arterial 32.0 - 48.0 mmHg - - - 44.3   Bicarbonate 20.0 - 28.0 mmol/L - - - 28.3  25.0  26.8   TCO2 22 - 32 mmol/L - 26  - '30  26  28   '$ O2 Saturation % - - - 65.0  70.0  99.0     Capillary Blood Glucose: No results found for: "GLUCAP"   Exercise Target Goals: Exercise Program  Goal: Individual exercise prescription set using results from initial 6 min walk test and THRR while considering  patient's activity barriers and safety.   Exercise Prescription Goal: Initial exercise prescription builds to 30-45 minutes a day of aerobic activity, 2-3 days per week.  Home exercise guidelines will be given to patient during program as part of exercise prescription that the participant will acknowledge.  Activity Barriers & Risk Stratification:  Activity Barriers & Cardiac Risk Stratification - 01/11/22 1557       Activity Barriers & Cardiac Risk Stratification   Activity Barriers Back Problems;Neck/Spine Problems;Joint Problems;Deconditioning;Muscular Weakness;Decreased Ventricular Function;Balance Concerns    Cardiac Risk Stratification High   under 5 mets on walk test            6 Minute Walk:  6 Minute Walk     Row Name 01/11/22 1555         6 Minute Walk   Phase Initial     Distance 1479 feet     Walk Time 6 minutes     # of Rest Breaks 0     MPH 2.8     METS 3.81     RPE 11     Perceived Dyspnea  0     VO2 Peak 13.32     Symptoms Yes (comment)     Comments left knee pain, 3/10, resolved with rest.     Resting HR 98 bpm     Resting BP 98/60     Resting Oxygen Saturation  98 %     Exercise Oxygen Saturation  during 6 min walk 100 %     Max Ex. HR 114 bpm     Max Ex. BP 104/62     2 Minute Post BP 98/58              Oxygen Initial Assessment:   Oxygen Re-Evaluation:   Oxygen Discharge (Final Oxygen Re-Evaluation):   Initial Exercise Prescription:  Initial Exercise Prescription - 01/11/22 1600       Date of Initial Exercise RX and Referring Provider   Date 01/11/22    Referring Provider Glori Bickers, MD    Expected Discharge Date 03/09/22      NuStep   Level 2    SPM 75    Minutes 15    METs 2.5      Arm Ergometer   Level 1    RPM 60    Minutes 15    METs 2      Prescription  Details   Frequency (times per week) 3     Duration Progress to 30 minutes of continuous aerobic without signs/symptoms of physical distress      Intensity   THRR 40-80% of Max Heartrate 64-129    Ratings of Perceived Exertion 11-13    Perceived Dyspnea 0-4      Progression   Progression Continue progressive overload as per policy without signs/symptoms or physical distress.      Resistance Training   Training Prescription Yes    Weight 2    Reps 10-15             Perform Capillary Blood Glucose checks as needed.  Exercise Prescription Changes:   Exercise Prescription Changes     Row Name 01/15/22 1400             Response to Exercise   Blood Pressure (Admit) 108/72       Blood Pressure (Exercise) 114/80       Blood Pressure (Exit) 100/68       Heart Rate (Admit) 85 bpm       Heart Rate (Exercise) 107 bpm       Heart Rate (Exit) 93 bpm       Rating of Perceived Exertion (Exercise) 11       Symptoms None       Comments Pt's first day in the CRP2 program.       Duration Continue with 30 min of aerobic exercise without signs/symptoms of physical distress.       Intensity THRR unchanged         Progression   Progression Continue to progress workloads to maintain intensity without signs/symptoms of physical distress.       Average METs 1.4         Resistance Training   Training Prescription Yes       Weight 2       Reps 10-15       Time 10 Minutes         Interval Training   Interval Training No         NuStep   Level 2       SPM 57       Minutes 15       METs 1.5         Arm Ergometer   Level 1       Watts 1       RPM 43       Minutes 15       METs 1.05                Exercise Comments:   Exercise Comments     Row Name 01/15/22 1413           Exercise Comments Pt's first day in the CRP2 program. Pt tolerated session well with no complaints. Patient is deconditioned, average METs < 1.5.                Exercise Goals and Review:   Exercise Goals     Row Name  01/11/22 1602             Exercise Goals   Increase Physical Activity Yes       Intervention Develop an individualized exercise prescription for aerobic and resistive training based on initial evaluation findings, risk stratification, comorbidities and participant's personal goals.;Provide advice, education, support and counseling about physical activity/exercise needs.       Expected Outcomes Short Term:  Attend rehab on a regular basis to increase amount of physical activity.;Long Term: Add in home exercise to make exercise part of routine and to increase amount of physical activity.;Long Term: Exercising regularly at least 3-5 days a week.       Increase Strength and Stamina Yes       Intervention Provide advice, education, support and counseling about physical activity/exercise needs.;Develop an individualized exercise prescription for aerobic and resistive training based on initial evaluation findings, risk stratification, comorbidities and participant's personal goals.       Expected Outcomes Short Term: Increase workloads from initial exercise prescription for resistance, speed, and METs.;Short Term: Perform resistance training exercises routinely during rehab and add in resistance training at home;Long Term: Improve cardiorespiratory fitness, muscular endurance and strength as measured by increased METs and functional capacity (6MWT)       Able to understand and use rate of perceived exertion (RPE) scale Yes       Intervention Provide education and explanation on how to use RPE scale       Expected Outcomes Short Term: Able to use RPE daily in rehab to express subjective intensity level;Long Term:  Able to use RPE to guide intensity level when exercising independently       Knowledge and understanding of Target Heart Rate Range (THRR) Yes       Intervention Provide education and explanation of THRR including how the numbers were predicted and where they are located for reference        Expected Outcomes Short Term: Able to state/look up THRR;Short Term: Able to use daily as guideline for intensity in rehab;Long Term: Able to use THRR to govern intensity when exercising independently       Understanding of Exercise Prescription Yes       Intervention Provide education, explanation, and written materials on patient's individual exercise prescription       Expected Outcomes Short Term: Able to explain program exercise prescription;Long Term: Able to explain home exercise prescription to exercise independently                Exercise Goals Re-Evaluation :  Exercise Goals Re-Evaluation     Row Name 01/15/22 1411             Exercise Goal Re-Evaluation   Exercise Goals Review Increase Physical Activity;Increase Strength and Stamina;Able to understand and use rate of perceived exertion (RPE) scale;Knowledge and understanding of Target Heart Rate Range (THRR);Understanding of Exercise Prescription       Comments Pt's first day in the CRP2 prgram. Pt understands the exercise Rx, THRR, and RPE scale.       Expected Outcomes Will continue to monitor and will progress exericse workloads as tolerated.                Discharge Exercise Prescription (Final Exercise Prescription Changes):  Exercise Prescription Changes - 01/15/22 1400       Response to Exercise   Blood Pressure (Admit) 108/72    Blood Pressure (Exercise) 114/80    Blood Pressure (Exit) 100/68    Heart Rate (Admit) 85 bpm    Heart Rate (Exercise) 107 bpm    Heart Rate (Exit) 93 bpm    Rating of Perceived Exertion (Exercise) 11    Symptoms None    Comments Pt's first day in the CRP2 program.    Duration Continue with 30 min of aerobic exercise without signs/symptoms of physical distress.    Intensity THRR unchanged  Progression   Progression Continue to progress workloads to maintain intensity without signs/symptoms of physical distress.    Average METs 1.4      Resistance Training   Training  Prescription Yes    Weight 2    Reps 10-15    Time 10 Minutes      Interval Training   Interval Training No      NuStep   Level 2    SPM 57    Minutes 15    METs 1.5      Arm Ergometer   Level 1    Watts 1    RPM 43    Minutes 15    METs 1.05             Nutrition:  Target Goals: Understanding of nutrition guidelines, daily intake of sodium '1500mg'$ , cholesterol '200mg'$ , calories 30% from fat and 7% or less from saturated fats, daily to have 5 or more servings of fruits and vegetables.  Biometrics:  Pre Biometrics - 01/11/22 1603       Pre Biometrics   Height '6\' 1"'$  (1.854 m)    Weight 94.9 kg    Waist Circumference 40.75 inches    Hip Circumference 46.5 inches    Waist to Hip Ratio 0.88 %    BMI (Calculated) 27.61    Triceps Skinfold 22 mm    % Body Fat 37.8 %    Grip Strength 31 kg    Flexibility 13.75 in    Single Leg Stand 5.6 seconds              Nutrition Therapy Plan and Nutrition Goals:  Nutrition Therapy & Goals - 01/15/22 1148       Nutrition Therapy   Diet Heart Healthy Diet    Drug/Food Interactions Statins/Certain Fruits      Personal Nutrition Goals   Nutrition Goal Patient to choose a daily variety of fruits, vegetables, whole grains, lean protein/plant protein, nonfat dairy as part of heart healthy lifestyle.    Personal Goal #2 Patient to limit to '1500mg'$  of sodium per day.    Personal Goal #3 Patient to identify and limit daily intake of saturated fat, trans fats, sodium, and refined carbohydrates.    Comments Patient reports making fried chicken at least 1x/week.      Intervention Plan   Intervention Prescribe, educate and counsel regarding individualized specific dietary modifications aiming towards targeted core components such as weight, hypertension, lipid management, diabetes, heart failure and other comorbidities.    Expected Outcomes Short Term Goal: Understand basic principles of dietary content, such as calories, fat,  sodium, cholesterol and nutrients.;Long Term Goal: Adherence to prescribed nutrition plan.             Nutrition Assessments:  Nutrition Assessments - 01/15/22 1157       Rate Your Plate Scores   Pre Score 54            MEDIFICTS Score Key: ?70 Need to make dietary changes  40-70 Heart Healthy Diet ? 40 Therapeutic Level Cholesterol Diet   Flowsheet Row INTENSIVE CARDIAC REHAB from 01/15/2022 in [place]  Picture Your Plate Total Score on Admission 54      Picture Your Plate Scores: <40 Unhealthy dietary pattern with much room for improvement. 41-50 Dietary pattern unlikely to meet recommendations for good health and room for improvement. 51-60 More healthful dietary pattern, with some room for improvement.  >60 Healthy dietary pattern, although there may  be some specific behaviors that could be improved.    Nutrition Goals Re-Evaluation:  Nutrition Goals Re-Evaluation     [place] Name 01/15/22 1148             Goals   Current Weight 212 lb 8.4 oz (96.4 kg)       Comment A1c 6.4                Nutrition Goals Re-Evaluation:  Nutrition Goals Re-Evaluation     Row Name 01/15/22 1148             Goals   Current Weight 212 lb 8.4 oz (96.4 kg)       Comment A1c 6.4                Nutrition Goals Discharge (Final Nutrition Goals Re-Evaluation):  Nutrition Goals Re-Evaluation - 01/15/22 1148       Goals   Current Weight 212 lb 8.4 oz (96.4 kg)    Comment A1c 6.4             Psychosocial: Target Goals: Acknowledge presence or absence of significant depression and/or stress, maximize coping skills, provide positive support system. Participant is able to verbalize types and ability to use techniques and skills needed for reducing stress and depression.  Initial Review & Psychosocial Screening:  Initial Psych Review & Screening - 01/11/22 1630       Initial Review   Current issues with Current Stress  Concerns    Source of Stress Concerns Financial    Comments Lynn Estrada is out on short term disability which ends soon.  Applied for long term disability. Struggles financially short term disability is not covering household needs and demands      Lynn Estrada? Yes    Comments Feels supported by family and friends      Barriers   Psychosocial barriers to participate in program The patient should benefit from training in stress management and relaxation.      Screening Interventions   Interventions Encouraged to exercise;Provide feedback about the scores to participant    Expected Outcomes Short Term goal: Identification and review with participant of any Quality of Life or Depression concerns found by scoring the questionnaire.;Long Term Goal: Stressors or current issues are controlled or eliminated.;Long Term goal: The participant improves quality of Life and PHQ9 Scores as seen by post scores and/or verbalization of changes             Quality of Life Scores:  Quality of Life - 01/17/22 0939       Quality of Life   Select Quality of Life      Quality of Life Scores   Health/Function Pre 21.2 %    Socioeconomic Pre 19.69 %    Psych/Spiritual Pre 24.86 %    Family Pre 27.6 %    GLOBAL Pre 22.5 %            Scores of 19 and below usually indicate a poorer quality of life in these areas.  A difference of  2-3 points is a clinically meaningful difference.  A difference of 2-3 points in the total score of the Quality of Life Index has been associated with significant improvement in overall quality of life, self-image, physical symptoms, and general health in studies assessing change in quality of life.  PHQ-9: Review Flowsheet  More data exists      01/11/2022 10/29/2017 09/11/2017 08/07/2017 08/01/2016  Depression screen PHQ 2/9  Decreased Interest 0 0 0 0 0  Down, Depressed, Hopeless 1 0 0 0 0  PHQ - 2 Score 1 0 0 0 0  Altered sleeping 0 - - - -   Tired, decreased energy 1 - - - -  Change in appetite 0 - - - -  Feeling bad or failure about yourself  0 - - - -  Trouble concentrating 0 - - - -  Moving slowly or fidgety/restless 0 - - - -  Suicidal thoughts 0 - - - -  PHQ-9 Score 2 - - - -  Difficult doing work/chores Somewhat difficult - - - -   Interpretation of Total Score  Total Score Depression Severity:  1-4 = Minimal depression, 5-9 = Mild depression, 10-14 = Moderate depression, 15-19 = Moderately severe depression, 20-27 = Severe depression   Psychosocial Evaluation and Intervention:   Psychosocial Re-Evaluation:  Psychosocial Re-Evaluation     Row Name 01/16/22 0753 01/23/22 1707           Psychosocial Re-Evaluation   Current issues with Current Stress Concerns Current Stress Concerns      Comments Lynn Estrada did not voice any concerns or stressors on her first day of exercise at cardiac rehab [place] has not voiced any increased concerns or stressors. Quality of life questionnaire reviewed on 01/17/22      Expected [place] will have decreased or controlled stress upon completion of traditional cardiac rehab. Lynn Estrada will have decreased or controlled stress upon completion of traditional cardiac rehab.      Interventions Encouraged to attend Cardiac Rehabilitation for the exercise Encouraged to attend Cardiac Rehabilitation for the exercise      Continue Psychosocial Services  No Follow up required No Follow up required        Initial Review   Source of Stress Concerns Financial Financial      Comments Will continue to monitor and offer support as needed Will continue to monitor and offer support as needed               Psychosocial Discharge (Final Psychosocial Re-Evaluation):  Psychosocial Re-Evaluation - 01/23/22 1707       Psychosocial Re-Evaluation   Current issues with Current Stress Concerns    Comments Lynn Estrada has not voiced any increased concerns or stressors. Quality of life  questionnaire reviewed on 01/17/22    Expected [place] will have decreased or controlled stress upon completion of traditional cardiac rehab.    Interventions Encouraged to attend Cardiac Rehabilitation for the exercise    Continue Psychosocial Services  No Follow up required      Initial Review   Source of Stress Concerns Financial    Comments Will continue to monitor and offer support as needed             Vocational Rehabilitation: Provide vocational rehab assistance to qualifying candidates.   Vocational Rehab Evaluation & Intervention:  Vocational Rehab - 01/11/22 1632       Initial Vocational Rehab Evaluation & Intervention   Assessment shows need for Vocational Rehabilitation No             Education: Education Goals: Education classes will be provided on a weekly basis, covering required topics. Participant will state understanding/return demonstration of topics presented.    Education - 01/22/22 0900       Education   Cardiac Education Topics Pritikin    Therapist, sports Exercise Physiologist  Select Nutrition    Nutrition Nutrition Action Plan    Instruction Review Code 1- Verbalizes Understanding    Class Start Time [phone]    Class Stop Time 0855    Class Time Calculation (min) 43 min             Core Videos: Exercise    Move It!  Clinical staff conducted group or individual video education with verbal and written material and guidebook.  Patient learns the recommended Pritikin exercise program. Exercise with the goal of living a long, healthy life. Some of the health benefits of exercise include controlled diabetes, healthier blood pressure levels, improved cholesterol levels, improved heart and lung capacity, improved sleep, and better body composition. Everyone should speak with their doctor before starting or changing an exercise routine.  Biomechanical Limitations Clinical staff conducted group or  individual video education with verbal and written material and guidebook.  Patient learns how biomechanical limitations can impact exercise and how we can mitigate and possibly overcome limitations to have an impactful and balanced exercise routine.  Body Composition Clinical staff conducted group or individual video education with verbal and written material and guidebook.  Patient learns that body composition (ratio of muscle mass to fat mass) is a key component to assessing overall fitness, rather than body weight alone. Increased fat mass, especially visceral belly fat, can put [place] at increased risk for metabolic syndrome, type 2 diabetes, heart disease, and even death. It is recommended to combine diet and exercise (cardiovascular and resistance training) to improve your body composition. Seek guidance from your physician and exercise physiologist before implementing an exercise routine.  Exercise Action Plan Clinical staff conducted group or individual video education with verbal and written material and guidebook.  Patient learns the recommended strategies to achieve and enjoy long-term exercise adherence, including variety, self-motivation, self-efficacy, and positive decision making. Benefits of exercise include fitness, good health, weight management, more energy, better sleep, less stress, and overall well-being.  Medical   Heart Disease Risk Reduction Clinical staff conducted group or individual video education with verbal and written material and guidebook.  Patient learns our heart is our most vital organ as it circulates oxygen, nutrients, white blood cells, and hormones throughout the entire body, and carries waste away. Data supports a plant-based eating plan like the Pritikin Program for its effectiveness in slowing progression of and reversing heart disease. The video provides a number of recommendations to address heart disease.   Metabolic Syndrome and Belly Fat  Clinical staff  conducted group or individual video education with verbal and written material and guidebook.  Patient learns what metabolic syndrome is, how it leads to heart disease, and how one can reverse it and keep it from coming back. You have metabolic syndrome if you have 3 of the following 5 criteria: abdominal obesity, high blood pressure, high triglycerides, low HDL cholesterol, and high blood sugar.  Hypertension and Heart Disease Clinical staff conducted group or individual video education with verbal and written material and guidebook.  Patient learns that high blood pressure, or hypertension, is very common in the [place]. Hypertension is largely due to excessive salt intake, but other important risk factors include being overweight, physical inactivity, drinking too much alcohol, smoking, and not eating enough potassium from fruits and vegetables. High blood pressure is a leading risk factor for heart attack, stroke, congestive heart failure, dementia, kidney failure, and premature death. Long-term effects of excessive salt intake include stiffening of the arteries and thickening of heart  muscle and organ damage. Recommendations include ways to reduce hypertension and the risk of heart disease.  Diseases of Our Time - Focusing on Diabetes Clinical staff conducted group or individual video education with verbal and written material and guidebook.  Patient learns why the best way to stop diseases of our time is prevention, through food and other lifestyle changes. Medicine (such as prescription pills and surgeries) is often only a Band-Aid on the problem, not a long-term solution. Most common diseases of our time include obesity, type 2 diabetes, hypertension, heart disease, and cancer. The Pritikin Program is recommended and has been proven to help reduce, reverse, and/or prevent the damaging effects of metabolic syndrome.  Nutrition   Overview of the Pritikin Eating Plan  Clinical staff conducted  group or individual video education with verbal and written material and guidebook.  Patient learns about the [place] for disease risk reduction. The [place] emphasizes a wide variety of unrefined, minimally-processed carbohydrates, like fruits, vegetables, whole grains, and legumes. Go, Caution, and Stop food choices are explained. Plant-based and lean animal proteins are emphasized. Rationale provided for low sodium intake for blood pressure control, low added sugars for blood sugar stabilization, and low added fats and oils for coronary artery disease risk reduction and weight management.  Calorie Density  Clinical staff conducted group or individual video education with verbal and written material and guidebook.  Patient learns about calorie density and how it impacts the Pritikin Eating Plan. Knowing the characteristics of the food you choose will help you decide whether those foods will lead to weight gain or weight loss, and whether you want to consume more or less of them. Weight loss is usually a side effect of the Pritikin Eating Plan because of its focus on low calorie-dense foods.  Label Reading  Clinical staff conducted group or individual video education with verbal and written material and guidebook.  Patient learns about the Pritikin recommended label reading guidelines and corresponding recommendations regarding calorie density, added sugars, sodium content, and whole grains.  Dining Out - Part 1  Clinical staff conducted group or individual video education with verbal and written material and guidebook.  Patient learns that restaurant meals can be sabotaging because they can be so high in calories, fat, sodium, and/or sugar. Patient learns recommended strategies on how to positively address this and avoid unhealthy pitfalls.  Facts on Fats  Clinical staff conducted group or individual video education with verbal and written material and guidebook.  Patient  learns that lifestyle modifications can be just as effective, if not more so, as many medications for lowering your risk of heart disease. A Pritikin lifestyle can help to reduce your risk of inflammation and atherosclerosis (cholesterol build-up, or plaque, in the artery walls). Lifestyle interventions such as dietary choices and physical activity address the cause of atherosclerosis. A review of the types of fats and their impact on blood cholesterol levels, along with dietary recommendations to reduce fat intake is also included.  Nutrition Action Plan  Clinical staff conducted group or individual video education with verbal and written material and guidebook.  Patient learns how to incorporate Pritikin recommendations into their lifestyle. Recommendations include planning and keeping personal health goals in mind as an important part of their success.  Healthy Mind-Set    Healthy Minds, Bodies, Hearts  Clinical staff conducted group or individual video education with verbal and written material and guidebook.  Patient learns how to identify when they are stressed. Video will  discuss the impact of that stress, as well as the many benefits of stress management. Patient will also be introduced to stress management techniques. The way we think, act, and feel has an impact on our hearts.  How Our Thoughts Can Heal Our Hearts  Clinical staff conducted group or individual video education with verbal and written material and guidebook.  Patient learns that negative thoughts can cause depression and anxiety. This can result in negative lifestyle behavior and serious health problems. Cognitive behavioral therapy is an effective method to help control our thoughts in order to change and improve our emotional outlook.  Additional Videos:  Exercise    Improving Performance  Clinical staff conducted group or individual video education with verbal and written material and guidebook.  Patient learns to use a  non-linear approach by alternating intensity levels and lengths of time spent exercising to help burn more calories and lose more body fat. Cardiovascular exercise helps improve heart health, metabolism, hormonal balance, blood sugar control, and recovery from fatigue. Resistance training improves strength, endurance, balance, coordination, reaction time, metabolism, and muscle mass. Flexibility exercise improves circulation, posture, and balance. Seek guidance from your physician and exercise physiologist before implementing an exercise routine and learn your capabilities and proper form for all exercise.  Introduction to Yoga  Clinical staff conducted group or individual video education with verbal and written material and guidebook.  Patient learns about yoga, a discipline of the coming together of mind, breath, and body. The benefits of yoga include improved flexibility, improved range of motion, better posture and core strength, increased lung function, weight loss, and positive self-image. Yoga's heart health benefits include lowered blood pressure, healthier heart rate, decreased cholesterol and triglyceride levels, improved immune function, and reduced stress. Seek guidance from your physician and exercise physiologist before implementing an exercise routine and learn your capabilities and proper form for all exercise.  Medical   Aging: Enhancing Your Quality of Life  Clinical staff conducted group or individual video education with verbal and written material and guidebook.  Patient learns key strategies and recommendations to stay in good physical health and enhance quality of life, such as prevention strategies, having an advocate, securing a Er[place], and keeping a list of medications and system for tracking them. It also discusses how to avoid risk for bone loss.  Biology of Weight Control  Clinical staff conducted group or individual video education with  verbal and written material and guidebook.  Patient learns that weight gain occurs because we consume more calories than we burn (eating more, moving less). Even if your body weight is normal, you may have higher ratios of fat compared to muscle mass. Too much body fat puts you at increased risk for cardiovascular disease, heart attack, stroke, type 2 diabetes, and obesity-related cancers. In addition to exercise, following the [place] can help reduce your risk.  Decoding Lab Results  Clinical staff conducted group or individual video education with verbal and written material and guidebook.  Patient learns that lab test reflects one measurement whose values change over time and are influenced by many factors, including medication, stress, sleep, exercise, food, hydration, pre-existing medical conditions, and more. It is recommended to use the knowledge from this video to become more involved with your lab results and evaluate your numbers to speak with your doctor.   Diseases of Our Time - Overview  Clinical staff conducted group or individual video education with verbal and written material and  guidebook.  Patient learns that according to the CDC, 50% to 70% of chronic diseases (such as obesity, type 2 diabetes, elevated lipids, hypertension, and heart disease) are avoidable through lifestyle improvements including healthier food choices, listening to satiety cues, and increased physical activity.  Sleep Disorders Clinical staff conducted group or individual video education with verbal and written material and guidebook.  Patient learns how good quality and duration of sleep are important to overall health and well-being. Patient also learns about sleep disorders and how they impact health along with recommendations to address them, including discussing with a physician.  Nutrition  Dining Out - Part 2 Clinical staff conducted group or individual video education with verbal and  written material and guidebook.  Patient learns how to plan ahead and communicate in order to maximize their dining experience in a healthy and nutritious manner. Included are recommended food choices based on the type of restaurant the patient is visiting.   Fueling a Best boy conducted group or individual video education with verbal and written material and guidebook.  There is a strong connection between our food choices and our health. Diseases like obesity and type 2 diabetes are very prevalent and are in large-part due to lifestyle choices. The Pritikin Eating Plan provides plenty of food and hunger-curbing satisfaction. It is easy to follow, affordable, and helps reduce health risks.  Menu Workshop  Clinical staff conducted group or individual video education with verbal and written material and guidebook.  Patient learns that restaurant meals can sabotage health goals because they are often packed with calories, fat, sodium, and sugar. Recommendations include strategies to plan ahead and to communicate with the manager, chef, or server to help order a healthier meal.  Planning Your Eating Strategy  Clinical staff conducted group or individual video education with verbal and written material and guidebook.  Patient learns about the [place] and its benefit of reducing the risk of disease. The [place] does not focus on calories. Instead, it emphasizes high-quality, nutrient-rich foods. By knowing the characteristics of the foods, we choose, we can determine their calorie density and make informed decisions.  Targeting Your Nutrition Priorities  Clinical staff conducted group or individual video education with verbal and written material and guidebook.  Patient learns that lifestyle habits have a tremendous impact on disease risk and progression. This video provides eating and physical activity recommendations based on your personal health goals,  such as reducing LDL cholesterol, losing weight, preventing or controlling type 2 diabetes, and reducing high blood pressure.  Vitamins and Minerals  Clinical staff conducted group or individual video education with verbal and written material and guidebook.  Patient learns different ways to obtain key vitamins and minerals, including through a recommended healthy diet. It is important to discuss all supplements you take with your doctor.   Healthy Mind-Set    Smoking Cessation  Clinical staff conducted group or individual video education with verbal and written material and guidebook.  Patient learns that cigarette smoking and tobacco addiction pose a serious health risk which affects millions of people. Stopping smoking will significantly reduce the risk of heart disease, lung disease, and many forms of cancer. Recommended strategies for quitting are covered, including working with your doctor to develop a successful plan.  Culinary   Becoming a Financial trader conducted group or individual video education with verbal and written material and guidebook.  Patient learns that cooking at home can be healthy, cost-effective,  quick, and puts them in control. Keys to cooking healthy recipes will include looking at your recipe, assessing your equipment needs, planning ahead, making it simple, choosing cost-effective seasonal ingredients, and limiting the use of added fats, salts, and sugars.  Cooking - Breakfast and Snacks  Clinical staff conducted group or individual video education with verbal and written material and guidebook.  Patient learns how important breakfast is to satiety and nutrition through the entire day. Recommendations include key foods to eat during breakfast to help stabilize blood sugar levels and to prevent overeating at meals later in the day. Planning ahead is also a key component.  Cooking - Human resources officer conducted group or individual video  education with verbal and written material and guidebook.  Patient learns eating strategies to improve overall health, including an approach to cook more at home. Recommendations include thinking of animal protein as a side on your plate rather than center stage and focusing instead on lower calorie dense options like vegetables, fruits, whole grains, and plant-based proteins, such as beans. Making sauces in large quantities to freeze for later and leaving the skin on your vegetables are also recommended to maximize your experience.  Cooking - Healthy Salads and Dressing Clinical staff conducted group or individual video education with verbal and written material and guidebook.  Patient learns that vegetables, fruits, whole grains, and legumes are the foundations of the [place]. Recommendations include how to incorporate each of these in flavorful and healthy salads, and how to create homemade salad dressings. Proper handling of ingredients is also covered. Cooking - Soups and [employer] - Soups and Desserts Clinical staff conducted group or individual video education with verbal and written material and guidebook.  Patient learns that Pritikin soups and desserts make for easy, nutritious, and delicious snacks and meal components that are low in sodium, fat, sugar, and calorie density, while high in vitamins, minerals, and filling fiber. Recommendations include simple and healthy ideas for soups and desserts.   Overview     The Pritikin Solution Program Overview Clinical staff conducted group or individual video education with verbal and written material and guidebook.  Patient learns that the results of the [place] Program have been documented in more than 100 articles published in peer-reviewed journals, and the benefits include reducing risk factors for (and, in some cases, even reversing) high cholesterol, high blood pressure, type 2 diabetes, obesity, and more! An overview of  the three key pillars of the Pritikin Program will be covered: eating well, doing regular exercise, and having a healthy mind-set.  WORKSHOPS  Exercise: Exercise Basics: Building Your Action Plan Clinical staff led group instruction and group discussion with PowerPoint presentation and patient guidebook. To enhance the learning environment the use of posters, models and videos may be added. At the conclusion of this workshop, patients will comprehend the difference between physical activity and exercise, as well as the benefits of incorporating both, into their routine. Patients will understand the FITT (Frequency, Intensity, Time, and Type) principle and how to use it to build an exercise action plan. In addition, safety concerns and other considerations for exercise and cardiac rehab will be addressed by the presenter. The purpose of this lesson is to promote a comprehensive and effective weekly exercise routine in order to improve patients' overall level of fitness.   Managing Heart Disease: Your Path to a Healthier Heart Clinical staff led group instruction and group discussion with PowerPoint presentation and patient guidebook. To  enhance the learning environment the use of posters, models and videos may be added.At the conclusion of this workshop, patients will understand the anatomy and physiology of the heart. Additionally, they will understand how Pritikin's three pillars impact the risk factors, the progression, and the management of heart disease.  The purpose of this lesson is to provide a high-level overview of the heart, heart disease, and how the Pritikin lifestyle positively impacts risk factors.  Exercise Biomechanics Clinical staff led group instruction and group discussion with PowerPoint presentation and patient guidebook. To enhance the learning environment the use of posters, models and videos may be added. Patients will learn how the structural parts of their bodies  function and how these functions impact their daily activities, movement, and exercise. Patients will learn how to promote a neutral spine, learn how to manage pain, and identify ways to improve their physical movement in order to promote healthy living. The purpose of this lesson is to expose patients to common physical limitations that impact physical activity. Participants will learn practical ways to adapt and manage aches and pains, and to minimize their effect on regular exercise. Patients will learn how to maintain good posture while sitting, walking, and lifting.  Balance Training and Fall Prevention  Clinical staff led group instruction and group discussion with PowerPoint presentation and patient guidebook. To enhance the learning environment the use of posters, models and videos may be added. At the conclusion of this workshop, patients will understand the importance of their sensorimotor skills (vision, proprioception, and the vestibular system) in maintaining their ability to balance as they age. Patients will apply a variety of balancing exercises that are appropriate for their current level of function. Patients will understand the common causes for poor balance, possible solutions to these problems, and ways to modify their physical environment in order to minimize their fall risk. The purpose of this lesson is to teach patients about the importance of maintaining balance as they age and ways to minimize their risk of falling.  WORKSHOPS   Nutrition:  Fueling a Scientist, research (physical sciences) led group instruction and group discussion with PowerPoint presentation and patient guidebook. To enhance the learning environment the use of posters, models and videos may be added. Patients will review the foundational principles of the [place] and understand what constitutes a serving size in each of the food groups. Patients will also learn Pritikin-friendly foods that are better  choices when away from home and review make-ahead meal and snack options. Calorie density will be reviewed and applied to three nutrition priorities: weight maintenance, weight loss, and weight gain. The purpose of this lesson is to reinforce (in a group setting) the key concepts around what patients are recommended to eat and how to apply these guidelines when away from home by planning and selecting Pritikin-friendly options. Patients will understand how calorie density may be adjusted for different weight management goals.  Mindful Eating  Clinical staff led group instruction and group discussion with PowerPoint presentation and patient guidebook. To enhance the learning environment the use of posters, models and videos may be added. Patients will briefly review the concepts of the [place] and the importance of low-calorie dense foods. The concept of mindful eating will be introduced as well as the importance of paying attention to internal hunger signals. Triggers for non-hunger eating and techniques for dealing with triggers will be explored. The purpose of this lesson is to provide patients with the opportunity to review the basic  principles of the [place], discuss the value of eating mindfully and how to measure internal cues of hunger and fullness using the Hunger Scale. Patients will also discuss reasons for non-hunger eating and learn strategies to use for controlling emotional eating.  Targeting Your Nutrition Priorities Clinical staff led group instruction and group discussion with PowerPoint presentation and patient guidebook. To enhance the learning environment the use of posters, models and videos may be added. Patients will learn how to determine their genetic susceptibility to disease by reviewing their family history. Patients will gain insight into the importance of diet as part of an overall healthy lifestyle in mitigating the impact of genetics and other  environmental insults. The purpose of this lesson is to provide patients with the opportunity to assess their personal nutrition priorities by looking at their family history, their own health history and current risk factors. Patients will also be able to discuss ways of prioritizing and modifying the [place] for their highest risk areas  Menu  Clinical staff led group instruction and group discussion with PowerPoint presentation and patient guidebook. To enhance the learning environment the use of posters, models and videos may be added. Using menus brought in from [employer], or printed from [employer], patients will apply the [place] dining out guidelines that were presented in the [employer] video. Patients will also be able to practice these guidelines in a variety of provided scenarios. The purpose of this lesson is to provide patients with the opportunity to practice hands-on learning of the [place] with actual menus and practice scenarios.  Label Reading Clinical staff led group instruction and group discussion with PowerPoint presentation and patient guidebook. To enhance the learning environment the use of posters, models and videos may be added. Patients will review and discuss the Pritikin label reading guidelines presented in Pritikin's Label Reading Educational series video. Using fool labels brought in from local grocery stores and markets, patients will apply the label reading guidelines and determine if the packaged food meet the Pritikin guidelines. The purpose of this lesson is to provide patients with the opportunity to review, discuss, and practice hands-on learning of the Pritikin Label Reading guidelines with actual packaged food labels. [place] Workshops are designed to teach patients ways to prepare quick, simple, and affordable recipes at home. The importance of nutrition's role in  chronic disease risk reduction is reflected in its emphasis in the overall Pritikin program. By learning how to prepare essential core Pritikin Eating Plan recipes, patients will increase control over what they eat; be able to customize the flavor of foods without the use of added salt, sugar, or fat; and improve the quality of the food they consume. By learning a set of core recipes which are easily assembled, quickly prepared, and affordable, patients are more likely to prepare more healthy foods at home. These workshops focus on convenient breakfasts, simple entres, side dishes, and desserts which can be prepared with minimal effort and are consistent with nutrition recommendations for cardiovascular risk reduction. Cooking [employer] are taught by a Engineer, materials (RD) who has been trained by the [employer]. The chef or RD has a clear understanding of the importance of minimizing - if not completely eliminating - added fat, sugar, and sodium in recipes. Throughout the series of [place] Workshop sessions, patients will learn about healthy ingredients and efficient methods of cooking to build confidence in  their capability to prepare    Cooking School weekly topics:  Adding Flavor- Sodium-Free  Fast and Healthy Breakfasts  Powerhouse Plant-Based Proteins  Satisfying Salads and Dressings  Simple Sides and Sauces  International Cuisine-Spotlight on the [place]  Delicious Desserts  Savory Soups  Efficiency Cooking - Meals in a Snap  Tasty Appetizers and Snacks  Comforting Weekend Breakfasts  One-Pot Wonders   Fast Evening Meals  Easy Entertaining  Personalizing Your Pritikin Plate  WORKSHOPS   Healthy Mindset (Psychosocial): New Thoughts, New Behaviors Clinical staff led group instruction and group discussion with PowerPoint presentation and patient guidebook. To enhance the learning environment the use of posters, models and videos may be added.  Patients will learn and practice techniques for developing effective health and lifestyle goals. Patients will be able to effectively apply the goal setting process learned to develop at least one new personal goal.  The purpose of this lesson is to expose patients to a new skill set of behavior modification techniques such as techniques setting SMART goals, overcoming barriers, and achieving new thoughts and new behaviors.  Managing Moods and Relationships Clinical staff led group instruction and group discussion with PowerPoint presentation and patient guidebook. To enhance the learning environment the use of posters, models and videos may be added. Patients will learn how emotional and chronic stress factors can impact their health and relationships. They will learn healthy ways to manage their moods and utilize positive coping mechanisms. In addition, ICR patients will learn ways to improve communication skills. The purpose of this lesson is to expose patients to ways of understanding how one's mood and health are intimately connected. Developing a healthy outlook can help build positive relationships and connections with others. Patients will understand the importance of utilizing effective communication skills that include actively listening and being heard. They will learn and understand the importance of the "4 Cs" and especially Connections in fostering of a Healthy Mind-Set.  Healthy Sleep for a Healthy Heart Clinical staff led group instruction and group discussion with PowerPoint presentation and patient guidebook. To enhance the learning environment the use of posters, models and videos may be added. At the conclusion of this workshop, patients will be able to demonstrate knowledge of the importance of sleep to overall health, well-being, and quality of life. They will understand the symptoms of, and treatments for, common sleep disorders. Patients will also be able to identify daytime and  nighttime behaviors which impact sleep, and they will be able to apply these tools to help manage sleep-related challenges. The purpose of this lesson is to provide patients with a general overview of sleep and outline the importance of quality sleep. Patients will learn about a few of the most common sleep disorders. Patients will also be introduced to the concept of "sleep hygiene," and discover ways to self-manage certain sleeping problems through simple daily behavior changes. Finally, the workshop will motivate patients by clarifying the links between quality sleep and their goals of heart-healthy living.   Recognizing and Reducing Stress Clinical staff led group instruction and group discussion with PowerPoint presentation and patient guidebook. To enhance the learning environment the use of posters, models and videos may be added. At the conclusion of this workshop, patients will be able to understand the types of stress reactions, differentiate between acute and chronic stress, and recognize the impact that chronic stress has on their health. They will also be able to apply different coping mechanisms, such as reframing negative self-talk. Patients will have  the opportunity to practice a variety of stress management techniques, such as deep abdominal breathing, progressive muscle relaxation, and/or guided imagery.  The purpose of this lesson is to educate patients on the role of stress in their lives and to provide healthy techniques for coping with it.  Learning Barriers/Preferences:  Learning Barriers/Preferences - 01/11/22 1605       Learning Barriers/Preferences   Learning Barriers Sight;Exercise Concerns   pt has occasional dizziness with position changes   Learning Preferences Written Material;Audio;Computer/Internet;Group Instruction;Individual Instruction;Pictoral;Skilled Demonstration;Verbal Instruction;Video             Education Topics:  Knowledge Questionnaire Score:   Knowledge Questionnaire Score - 01/11/22 1557       Knowledge Questionnaire Score   Pre Score 15/24             Core Components/Risk Factors/Patient Goals at Admission:  Personal Goals and Risk Factors at Admission - 01/11/22 1607       Core Components/Risk Factors/Patient Goals on Admission   Heart Failure Yes    Intervention Provide a combined exercise and nutrition program that is supplemented with education, support and counseling about heart failure. Directed toward relieving symptoms such as shortness of breath, decreased exercise tolerance, and extremity edema.    Expected Outcomes Long term: Adoption of self-care skills and reduction of barriers for early signs and symptoms recognition and intervention leading to self-care maintenance.;Short term: Daily weights obtained and reported for increase. Utilizing diuretic protocols set by physician.;Short term: Attendance in program 2-3 days a week with increased exercise capacity. Reported lower sodium intake. Reported increased fruit and vegetable intake. Reports medication compliance.;Improve functional capacity of life    Hypertension Yes    Intervention Provide education on lifestyle modifcations including regular physical activity/exercise, weight management, moderate sodium restriction and increased consumption of fresh fruit, vegetables, and low fat dairy, alcohol moderation, and smoking cessation.;Monitor prescription use compliance.    Expected Outcomes Short Term: Continued assessment and intervention until BP is < 140/50m HG in hypertensive participants. < 130/862mHG in hypertensive participants with diabetes, heart failure or chronic kidney disease.;Long Term: Maintenance of blood pressure at goal levels.    Lipids Yes    Intervention Provide education and support for participant on nutrition & aerobic/resistive exercise along with prescribed medications to achieve LDL '70mg'$ , HDL >'40mg'$ .    Expected Outcomes Long Term:  Cholesterol controlled with medications as prescribed, with individualized exercise RX and with personalized nutrition plan. Value goals: LDL < '70mg'$ , HDL > 40 mg.;Short Term: Participant states understanding of desired cholesterol values and is compliant with medications prescribed. Participant is following exercise prescription and nutrition guidelines.    Stress Yes    Intervention Offer individual and/or small group education and counseling on adjustment to heart disease, stress management and health-related lifestyle change. Teach and support self-help strategies.;Refer participants experiencing significant psychosocial distress to appropriate mental health specialists for further evaluation and treatment. When possible, include family members and significant others in education/counseling sessions.    Expected Outcomes Short Term: Participant demonstrates changes in health-related behavior, relaxation and other stress management skills, ability to obtain effective social support, and compliance with psychotropic medications if prescribed.;Long Term: Emotional wellbeing is indicated by absence of clinically significant psychosocial distress or social isolation.    Personal Goal Other Yes    Personal Goal short term: upper body strength. long term: decrease gut size, tone core.    Intervention will continue to monitor pt and progress workloads as tolerated without s/s.  Expected Outcomes pt will achieve their goals.             Core Components/Risk Factors/Patient Goals Review:   Goals and Risk Factor Review     Row Name 01/17/22 1319 01/23/22 1708           Core Components/Risk Factors/Patient Goals Review   Personal Goals Review Hypertension;Lipids;Stress Hypertension;Lipids;Stress      Review Jeani Hawking started intensive cardiac rehab on 01/15/22 and did well with exercise. Vtial signs stable Jeani Hawking continues to  well with exercise at intensive cardiac rehab. Vtial signs remain stable       Expected Outcomes Jeani Hawking will continue to participate in phase 2 cardiac rehab for exercise, nutrition and lifestyle modifications Jeani Hawking will continue to participate in phase 2 cardiac rehab for exercise, nutrition and lifestyle modifications               Core Components/Risk Factors/Patient Goals at Discharge (Final Review):   Goals and Risk Factor Review - 01/23/22 1708       Core Components/Risk Factors/Patient Goals Review   Personal Goals Review Hypertension;Lipids;Stress    Review Jeani Hawking continues to  well with exercise at intensive cardiac rehab. Vtial signs remain stable    Expected Outcomes Jeani Hawking will continue to participate in phase 2 cardiac rehab for exercise, nutrition and lifestyle modifications             ITP Comments:  ITP Comments     Row Name 01/11/22 1629 01/16/22 0748 01/23/22 1706       ITP Comments Medical Director - Dr. Fransico Him 30 Day ITP Review. Retha started cardiac rehab on 01/15/22 and did well with exercise 30 Day ITP Review. [place] is off to a good start to exercise at intensive cardiac rehab.              Comments: See ITP comments.Harrell Gave RN BSN

## 2022-01-24 ENCOUNTER — Encounter (HOSPITAL_COMMUNITY)
Admission: RE | Admit: 2022-01-24 | Discharge: 2022-01-24 | Disposition: A | Discharge status: Home / Self Care | Payer: BC Managed Care – PPO | Source: Ambulatory Visit | Attending: Internal Medicine | Admitting: Internal Medicine

## 2022-01-24 DIAGNOSIS — I5042 Chronic combined systolic (congestive) and diastolic (congestive) heart failure: Secondary | ICD-10-CM | POA: Diagnosis not present

## 2022-01-26 ENCOUNTER — Encounter (HOSPITAL_COMMUNITY)
Admission: RE | Admit: 2022-01-26 | Discharge: 2022-01-26 | Disposition: A | Discharge status: Home / Self Care | Payer: BC Managed Care – PPO | Source: Ambulatory Visit | Attending: Internal Medicine | Admitting: Internal Medicine

## 2022-01-26 DIAGNOSIS — I5042 Chronic combined systolic (congestive) and diastolic (congestive) heart failure: Secondary | ICD-10-CM

## 2022-01-29 ENCOUNTER — Encounter (HOSPITAL_COMMUNITY)
Admission: RE | Admit: 2022-01-29 | Discharge: 2022-01-29 | Disposition: A | Discharge status: Home / Self Care | Payer: BC Managed Care – PPO | Source: Ambulatory Visit | Attending: Internal Medicine | Admitting: Internal Medicine

## 2022-01-29 DIAGNOSIS — I5042 Chronic combined systolic (congestive) and diastolic (congestive) heart failure: Secondary | ICD-10-CM

## 2022-01-31 ENCOUNTER — Encounter (HOSPITAL_COMMUNITY)
Admission: RE | Admit: 2022-01-31 | Discharge: 2022-01-31 | Disposition: A | Discharge status: Home / Self Care | Payer: Medicaid Other | Source: Ambulatory Visit | Attending: Internal Medicine | Admitting: Internal Medicine

## 2022-01-31 ENCOUNTER — Telehealth (HOSPITAL_COMMUNITY): Payer: Self-pay

## 2022-01-31 DIAGNOSIS — G9339 Other post infection and related fatigue syndromes: Secondary | ICD-10-CM | POA: Diagnosis not present

## 2022-01-31 DIAGNOSIS — R739 Hyperglycemia, unspecified: Secondary | ICD-10-CM | POA: Insufficient documentation

## 2022-01-31 DIAGNOSIS — U099 Post covid-19 condition, unspecified: Secondary | ICD-10-CM | POA: Diagnosis not present

## 2022-01-31 DIAGNOSIS — Z7982 Long term (current) use of aspirin: Secondary | ICD-10-CM | POA: Insufficient documentation

## 2022-01-31 DIAGNOSIS — E559 Vitamin D deficiency, unspecified: Secondary | ICD-10-CM | POA: Diagnosis not present

## 2022-01-31 DIAGNOSIS — M6281 Muscle weakness (generalized): Secondary | ICD-10-CM | POA: Diagnosis not present

## 2022-01-31 DIAGNOSIS — I110 Hypertensive heart disease with heart failure: Secondary | ICD-10-CM | POA: Insufficient documentation

## 2022-01-31 DIAGNOSIS — I5042 Chronic combined systolic (congestive) and diastolic (congestive) heart failure: Secondary | ICD-10-CM | POA: Insufficient documentation

## 2022-01-31 DIAGNOSIS — Z79899 Other long term (current) drug therapy: Secondary | ICD-10-CM | POA: Diagnosis not present

## 2022-01-31 DIAGNOSIS — R42 Dizziness and giddiness: Secondary | ICD-10-CM | POA: Diagnosis not present

## 2022-01-31 DIAGNOSIS — D649 Anemia, unspecified: Secondary | ICD-10-CM | POA: Diagnosis not present

## 2022-01-31 NOTE — Telephone Encounter (Signed)
Required documents faxed to [facility] on 01/31/22 at 11.10am

## 2022-02-01 ENCOUNTER — Encounter (HOSPITAL_COMMUNITY): Payer: Self-pay

## 2022-02-01 ENCOUNTER — Telehealth (HOSPITAL_COMMUNITY): Payer: Self-pay | Admitting: Pharmacy Technician

## 2022-02-01 ENCOUNTER — Other Ambulatory Visit (HOSPITAL_COMMUNITY): Payer: Self-pay

## 2022-02-01 ENCOUNTER — Ambulatory Visit (HOSPITAL_COMMUNITY)
Admission: RE | Admit: 2022-02-01 | Discharge: 2022-02-01 | Disposition: A | Discharge status: Home / Self Care | Payer: Medicaid Other | Source: Ambulatory Visit | Attending: Physician Assistant | Admitting: Physician Assistant

## 2022-02-01 VITALS — BP 114/70 | HR 95 | Wt 208.4 lb

## 2022-02-01 DIAGNOSIS — R5383 Other fatigue: Secondary | ICD-10-CM | POA: Diagnosis not present

## 2022-02-01 DIAGNOSIS — Z7984 Long term (current) use of oral hypoglycemic drugs: Secondary | ICD-10-CM | POA: Insufficient documentation

## 2022-02-01 DIAGNOSIS — Z79899 Other long term (current) drug therapy: Secondary | ICD-10-CM | POA: Diagnosis not present

## 2022-02-01 DIAGNOSIS — I2510 Atherosclerotic heart disease of native coronary artery without angina pectoris: Secondary | ICD-10-CM | POA: Insufficient documentation

## 2022-02-01 DIAGNOSIS — R140 Abdominal distension (gaseous): Secondary | ICD-10-CM | POA: Diagnosis not present

## 2022-02-01 DIAGNOSIS — Z7901 Long term (current) use of anticoagulants: Secondary | ICD-10-CM | POA: Insufficient documentation

## 2022-02-01 DIAGNOSIS — I10 Essential (primary) hypertension: Secondary | ICD-10-CM | POA: Diagnosis not present

## 2022-02-01 DIAGNOSIS — I428 Other cardiomyopathies: Secondary | ICD-10-CM | POA: Insufficient documentation

## 2022-02-01 DIAGNOSIS — I447 Left bundle-branch block, unspecified: Secondary | ICD-10-CM | POA: Diagnosis not present

## 2022-02-01 DIAGNOSIS — I110 Hypertensive heart disease with heart failure: Secondary | ICD-10-CM | POA: Diagnosis not present

## 2022-02-01 DIAGNOSIS — I5042 Chronic combined systolic (congestive) and diastolic (congestive) heart failure: Secondary | ICD-10-CM | POA: Insufficient documentation

## 2022-02-01 DIAGNOSIS — R0683 Snoring: Secondary | ICD-10-CM | POA: Diagnosis not present

## 2022-02-01 DIAGNOSIS — Z905 Acquired absence of kidney: Secondary | ICD-10-CM | POA: Diagnosis not present

## 2022-02-01 DIAGNOSIS — R0609 Other forms of dyspnea: Secondary | ICD-10-CM | POA: Insufficient documentation

## 2022-02-01 LAB — BRAIN NATRIURETIC PEPTIDE: B Natriuretic Peptide: 296.6 pg/mL — ABNORMAL HIGH (ref 0.0–100.0)

## 2022-02-01 LAB — COMPREHENSIVE METABOLIC PANEL
ALT: 23 U/L (ref 0–44)
AST: 21 U/L (ref 15–41)
Albumin: 3.9 g/dL (ref 3.5–5.0)
Alkaline Phosphatase: 58 U/L (ref 38–126)
Anion gap: 6 (ref 5–15)
BUN: 18 mg/dL (ref 6–20)
CO2: 29 mmol/L (ref 22–32)
Calcium: 9.6 mg/dL (ref 8.9–10.3)
Chloride: 107 mmol/L (ref 98–111)
Creatinine, Ser: 1.06 mg/dL — ABNORMAL HIGH (ref 0.44–1.00)
GFR, Estimated: >60 mL/min (ref >60)
Glucose, Bld: 88 mg/dL (ref 70–99)
Potassium: 4.3 mmol/L (ref 3.5–5.1)
Sodium: 142 mmol/L (ref 135–145)
Total Bilirubin: 0.9 mg/dL (ref 0.3–1.2)
Total Protein: 7.2 g/dL (ref 6.5–8.1)

## 2022-02-01 MED ORDER — METOPROLOL SUCCINATE ER 25 MG PO TB24: 25.0000 mg | ORAL_TABLET | Freq: Every day | ORAL | 3 refills | Status: DC

## 2022-02-01 MED ORDER — DIGOXIN 125 MCG PO TABS: 0.1250 mg | ORAL_TABLET | Freq: Every day | ORAL | 2 refills | Status: DC

## 2022-02-01 NOTE — Telephone Encounter (Signed)
Advanced Heart Failure Patient Advocate Encounter  Prior Authorization for Lynn Estrada has been approved.    PA# [id number] Effective dates: 01/18/22 through 02/01/23  Charlann Boxer, CPhT

## 2022-02-01 NOTE — Patient Instructions (Addendum)
START Metoprolol XL 25 mg one tab daily INCREASE Lasix to 40 mg twice a day for 2 days, then resume normal dose of 40 mg daily as needed  Labs today We will only contact you if something comes back abnormal or we need to make some changes. Otherwise no news is good news!  Your physician wants you to follow-up in: 3 months with Dr Haroldine Laws and echo. You will receive a reminder letter in the mail two months in advance. If you don't receive a letter, please call our office to schedule the follow-up appointment.   Your physician has requested that you have an echocardiogram. Echocardiography is a painless test that uses sound waves to create images of your heart. It provides your doctor with information about the size and shape of your heart and how well your heart's chambers and valves are working. This procedure takes approximately one hour. There are no restrictions for this procedure.   Do the following things EVERYDAY: Weigh yourself in the morning before breakfast. Write it down and keep it in a log. Take your medicines as prescribed Eat low salt foods--Limit salt (sodium) to 2000 mg per day.  Stay as active as you can everyday Limit all fluids for the day to less than 2 liters  At the [facility], you and your health needs are our priority. As part of our continuing mission to provide you with exceptional heart care, we have created designated Provider Care Teams. These Care Teams include your primary Cardiologist (physician) and Advanced Practice Providers (APPs- Physician Assistants and Nurse Practitioners) who all work together to provide you with the care you need, when you need it.   You may see any of the following providers on your designated Care Team at your next follow up: Dr Glori Bickers Dr Haynes Kerns, NP Lyda Jester, [place] [facility] [place], [place] Audry Riles, PharmD   Please be sure to bring in all your medications  bottles to every appointment.

## 2022-02-01 NOTE — Progress Notes (Signed)
ReDS Vest / Clip - 02/01/22 1000       ReDS Vest / Clip   Station Marker C    Ruler Value 28    ReDS Value Range Moderate volume overload    ReDS Actual Value 39

## 2022-02-01 NOTE — Progress Notes (Signed)
  PCP: Chelle Jeffery PA-C, [facility] Primary Cardiologist: Dr Harwani HF MD: Dr Bensimhon   HPI: Lynn Estrada is a 60 y.o.with a history of HTN, GERD, nephrolithiasis, partial right nephrectomy, LBBB, and combined diastolic/systolic HF.      Admitted 1/23 with acute HFrEF. 07/13/21 Echo 20-25%, RV normal, grade II DD , and septal - lateral dyssnchrony. R/L cath w/ Minimal CAD, mildly elevated filling pressures and normal CO. cMRI ? Myocarditis (see impression below). LVEF 18%. Placed on Jardiance and spiro. Not on bb with acute decompensation. No ARNi with hypotension. Discharged on 07/20/21.   Echo 10/25/21 EF 30-35%  Last seen for f/u 06/30. Volume looked good. BP too low to titrate GDMT. Did not wish to proceed with sleep study yet.  She is here today for HF follow-up. Has been attending cardiac rehab the last few weeks. Continues to have dyspnea with exertion but states this is improving. Denies LE edema and PND but reports occasional orthopnea and abdominal bloating. Her weight has been stable. She drinks a lot of fluid, reports > 64 oz of water most days. BP averaging 100s/60s-70s. HR 80s at rest. Denies dizziness or syncope.    Cardiac Studies  - Echo (4/23): EF 30-35%  - R/L cath (1/23): w/ Minimal CAD. EF 20%, Mildly elevated filling pressures and normal CO.  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 LVEF 18% . Although modified [place] Criteria for myocarditis is technically met, LGE and parametric mapping are not class for this.   ROS: All systems negative except as listed in HPI, PMH and Problem List.  SH:  Social History   Socioeconomic History   Marital status: Married    Spouse name: Lynn Estrada   Number of children: 1   Years of education: college   Highest education level: Not on file  Occupational History   Occupation: filter manufacturing  Tobacco Use    Smoking status: Former    Packs/day: 0.75    Years: 5.00    Total pack years: 3.75    Types: Cigarettes    Quit date: 01/10/1997    Years since quitting: 25.0   Smokeless tobacco: Never  Vaping Use   Vaping Use: Never used  Substance and Sexual Activity   Alcohol use: Yes    Alcohol/week: 0.0 standard drinks of alcohol    Comment: social use; once a month   Drug use: No   Sexual activity: Yes    Partners: Male    Birth control/protection: Surgical  Other Topics Concern   Not on file  Social History Narrative   Lives with her husband and their daughter and their dog.   Social Determinants of Health   Financial Resource Strain: Medium Risk (07/27/2021)   Overall Financial Resource Strain (CARDIA)    Difficulty of Paying Living Expenses: Somewhat hard  Food Insecurity: Food Insecurity Present (07/27/2021)   Hunger Vital Sign    Worried About Running Out of Food in the Last Year: Sometimes true    Ran Out of Food in the Last Year: Never true  Transportation Needs: No Transportation Needs (07/20/2021)   PRAPARE - Transportation    Lack of Transportation (Medical): No    Lack of Transportation (Non-Medical): No  Physical Activity: Not on file  Stress: Not on file  Social Connections: Not on file  Intimate Partner Violence: Not on file   FH:  Family History    Problem Relation Age of Onset   Hypertension Sister    Eczema Sister    Lupus Sister    Sarcoidosis Mother    Prostate cancer Father    Gout Maternal Grandmother    Diabetes Maternal Grandmother        leg amputations   Breast cancer Maternal Aunt    Stomach cancer Neg Hx    Colon cancer Neg Hx    Past Medical History:  Diagnosis Date   Anemia    Eczema    GERD (gastroesophageal reflux disease)    History of adenomatous polyp of colon    08-03-2016  tubular adenoma x2 and hyperplastic   History of chronic gastritis 08/03/2016   History of ectopic pregnancy 1988   s/p  left salpingectomy   History of  endometriosis    History of kidney stones    History of partial nephrectomy    right pelvis for very large stone   History of pneumonia 07/02/2016   CAP   History of sepsis    07-01-2016  sepsis w/ pyelonephritis, CAP /   12-31-2016  urosepsis w/ kidney stone obstruction   History of uterine leiomyoma    Hypertension    Left ureteral stone    Nephrolithiasis    left obstructive stone and right non-obstructive stone  per CT 12-31-2016   Urgency of urination    Current Outpatient Medications  Medication Sig Dispense Refill   acetaminophen (TYLENOL) 500 MG tablet Take 500-1,000 mg by mouth every 6 (six) hours as needed (for pain.).     aspirin 81 MG EC tablet Take 81 mg by mouth daily with lunch.     atorvastatin (LIPITOR) 10 MG tablet Take 10 mg by mouth daily.     cyclobenzaprine (FLEXERIL) 5 MG tablet Take 5-10 mg by mouth 3 (three) times daily as needed for spasms.     empagliflozin (JARDIANCE) 10 MG TABS tablet Take 1 tablet (10 mg total) by mouth daily. 30 tablet 2   furosemide (LASIX) 40 MG tablet Take 1 tablet (40 mg total) by mouth daily as needed. 30 tablet 5   HYDROcodone-acetaminophen (NORCO) 10-325 MG tablet Take 0.5 tablets by mouth every 6 (six) hours as needed for pain.     metoprolol succinate (TOPROL-XL) 25 MG 24 hr tablet Take 1 tablet (25 mg total) by mouth daily. Take with or immediately following a meal. 90 tablet 3   Multiple Vitamin (MULTIVITAMIN WITH MINERALS) TABS tablet Take 1 tablet by mouth daily with lunch. One A Day for Women 50+     sacubitril-valsartan (ENTRESTO) 24-26 MG Take 1 tablet by mouth 2 (two) times daily. 60 tablet 11   spironolactone (ALDACTONE) 25 MG tablet Take 1 tablet (25 mg total) by mouth daily. 30 tablet 5   digoxin (LANOXIN) 0.125 MG tablet Take 1 tablet (0.125 mg total) by mouth daily. 30 tablet 2   No current facility-administered medications for this encounter.   BP 114/70   Pulse 95   Wt 94.5 kg (208 lb 6.4 oz)   SpO2 95%   BMI  27.50 kg/m   Wt Readings from Last 3 Encounters:  02/01/22 94.5 kg (208 lb 6.4 oz)  01/11/22 94.9 kg (209 lb 3.5 oz)  12/29/21 94.3 kg (208 lb)   PHYSICAL EXAM: General:  Well appearing. Ambulated into clinic. HEENT: normal Neck: supple. JVP 8-10. Carotids 2+ bilat; no bruits.  Cor: PMI nondisplaced. Regular rate & rhythm. No rubs, gallops or murmurs. Lungs: clear Abdomen: soft, nontender,  mildly distended Extremities: no cyanosis, clubbing, rash, trace edema Neuro: alert & orientedx3, cranial nerves grossly intact. moves all 4 extremities w/o difficulty. Affect pleasant   ASSESSMENT & PLAN: Chronic Combined Systolic/Diastolic HF, NICM.  - 07/13/21 Echo 20-25%, RV normal, grade II DD , and septal -lateral dyssnchrony - 07/2021 cMRI possible myocarditis  LVEF 18% RVEF 33% - LHC /RHC 07/2021 min CAD. RA 6 PCWP 21, CO 6.8/ CI 3.1 - LBBB, QRS previously < 150 Lynn. QRS 174 Lynn on ECG today.  - Echo(4/23): EF 30-35% -> EF improving but still with significant LV dysfunction (suspect resolving myocarditis) - NYHA II/early III. Volume mildly elevated on exam and by ReDS 39%.  - Prescribed lasix PRN but has been takign 40 mg daily. Instructed her to take 80 mg daily X 2 days then go back to 40 mg daily. Cut back fluid intake. - Continue Entresto 24/26 mg bid.  - Continue digoxin 0.125 mg daily. Refilled today. - Continue spironolactone 25 mg daily  - Continue Jardiance 10 mg daily.  - Add Toprol XL 25 mg daily - CMET/BNP today - F/u 3 months with echo. If EF < or = 35%, will need to be considered for ICD. Has LBBB, QRS wider on today's ECG, ? Candidacy for CRT-D.  2. HTN - Well-controlled. - No changes.  3. LBBB - QRS previously < 150 Lynn, wider on today's ECG (174 Lynn) - See discussion above  4. Partial R Nephrectomy - CMET today.  5. Snoring/daytime fatigue - Deferred sleep study   Follow up: 3 months with echo  FINCH, LINDSAY N PA-C 10:46 AM 

## 2022-02-01 NOTE — Telephone Encounter (Signed)
Patient Advocate Encounter   Received notification from [facility] that prior authorization for Lynn Estrada is required.   PA submitted on CoverMyMeds Key  [id number]  Status is pending   Will continue to follow.

## 2022-02-01 NOTE — Telephone Encounter (Signed)
Advanced Heart Failure Patient Advocate Encounter  Prior Authorization for Lynn Estrada has been approved.    PA# [id number] Effective dates: 01/18/2022 through 02/01/23  Charlann Boxer, CPhT

## 2022-02-01 NOTE — Telephone Encounter (Signed)
Patient Advocate Encounter   Received notification from [facility] that prior authorization for Jardiance is required.   PA submitted on CoverMyMeds Key [id number]  Status is pending   Will continue to follow.

## 2022-02-02 ENCOUNTER — Encounter (HOSPITAL_COMMUNITY)
Admission: RE | Admit: 2022-02-02 | Discharge: 2022-02-02 | Disposition: A | Discharge status: Home / Self Care | Payer: Medicaid Other | Source: Ambulatory Visit | Attending: Internal Medicine | Admitting: Internal Medicine

## 2022-02-02 DIAGNOSIS — I110 Hypertensive heart disease with heart failure: Secondary | ICD-10-CM | POA: Diagnosis not present

## 2022-02-02 DIAGNOSIS — I5042 Chronic combined systolic (congestive) and diastolic (congestive) heart failure: Secondary | ICD-10-CM

## 2022-02-05 ENCOUNTER — Encounter (HOSPITAL_COMMUNITY)
Admission: RE | Admit: 2022-02-05 | Discharge: 2022-02-05 | Disposition: A | Discharge status: Home / Self Care | Payer: Medicaid Other | Source: Ambulatory Visit | Attending: Internal Medicine | Admitting: Internal Medicine

## 2022-02-05 DIAGNOSIS — I5042 Chronic combined systolic (congestive) and diastolic (congestive) heart failure: Secondary | ICD-10-CM

## 2022-02-05 DIAGNOSIS — I110 Hypertensive heart disease with heart failure: Secondary | ICD-10-CM | POA: Diagnosis not present

## 2022-02-07 ENCOUNTER — Encounter (HOSPITAL_COMMUNITY)
Admission: RE | Admit: 2022-02-07 | Discharge: 2022-02-07 | Disposition: A | Discharge status: Home / Self Care | Payer: Medicaid Other | Source: Ambulatory Visit | Attending: Internal Medicine | Admitting: Internal Medicine

## 2022-02-07 DIAGNOSIS — I110 Hypertensive heart disease with heart failure: Secondary | ICD-10-CM | POA: Diagnosis not present

## 2022-02-07 DIAGNOSIS — I5042 Chronic combined systolic (congestive) and diastolic (congestive) heart failure: Secondary | ICD-10-CM

## 2022-02-09 ENCOUNTER — Encounter (HOSPITAL_COMMUNITY)
Admission: RE | Admit: 2022-02-09 | Discharge: 2022-02-09 | Disposition: A | Discharge status: Home / Self Care | Payer: Medicaid Other | Source: Ambulatory Visit | Attending: Internal Medicine | Admitting: Internal Medicine

## 2022-02-09 DIAGNOSIS — I5042 Chronic combined systolic (congestive) and diastolic (congestive) heart failure: Secondary | ICD-10-CM

## 2022-02-09 DIAGNOSIS — I110 Hypertensive heart disease with heart failure: Secondary | ICD-10-CM | POA: Diagnosis not present

## 2022-02-12 ENCOUNTER — Encounter (HOSPITAL_COMMUNITY)
Admission: RE | Admit: 2022-02-12 | Discharge: 2022-02-12 | Disposition: A | Discharge status: Home / Self Care | Payer: Medicaid Other | Source: Ambulatory Visit | Attending: Internal Medicine | Admitting: Internal Medicine

## 2022-02-12 DIAGNOSIS — I110 Hypertensive heart disease with heart failure: Secondary | ICD-10-CM | POA: Diagnosis not present

## 2022-02-12 DIAGNOSIS — I5042 Chronic combined systolic (congestive) and diastolic (congestive) heart failure: Secondary | ICD-10-CM

## 2022-02-14 ENCOUNTER — Encounter (HOSPITAL_COMMUNITY)
Admission: RE | Admit: 2022-02-14 | Discharge: 2022-02-14 | Disposition: A | Discharge status: Home / Self Care | Payer: Medicaid Other | Source: Ambulatory Visit | Attending: Internal Medicine | Admitting: Internal Medicine

## 2022-02-14 DIAGNOSIS — I110 Hypertensive heart disease with heart failure: Secondary | ICD-10-CM | POA: Diagnosis not present

## 2022-02-14 DIAGNOSIS — I5042 Chronic combined systolic (congestive) and diastolic (congestive) heart failure: Secondary | ICD-10-CM

## 2022-02-16 ENCOUNTER — Encounter (HOSPITAL_COMMUNITY)
Admission: RE | Admit: 2022-02-16 | Discharge: 2022-02-16 | Disposition: A | Discharge status: Home / Self Care | Payer: Medicaid Other | Source: Ambulatory Visit | Attending: Internal Medicine | Admitting: Internal Medicine

## 2022-02-16 DIAGNOSIS — I5042 Chronic combined systolic (congestive) and diastolic (congestive) heart failure: Secondary | ICD-10-CM

## 2022-02-16 DIAGNOSIS — I110 Hypertensive heart disease with heart failure: Secondary | ICD-10-CM | POA: Diagnosis not present

## 2022-02-19 ENCOUNTER — Telehealth (HOSPITAL_COMMUNITY): Payer: Self-pay | Admitting: *Deleted

## 2022-02-19 ENCOUNTER — Encounter (HOSPITAL_COMMUNITY)
Admission: RE | Admit: 2022-02-19 | Discharge: 2022-02-19 | Disposition: A | Discharge status: Home / Self Care | Payer: Medicaid Other | Source: Ambulatory Visit | Attending: Internal Medicine | Admitting: Internal Medicine

## 2022-02-19 DIAGNOSIS — I5042 Chronic combined systolic (congestive) and diastolic (congestive) heart failure: Secondary | ICD-10-CM

## 2022-02-19 DIAGNOSIS — I110 Hypertensive heart disease with heart failure: Secondary | ICD-10-CM | POA: Diagnosis not present

## 2022-02-19 MED ORDER — CARVEDILOL 3.125 MG PO TABS: 3.1250 mg | ORAL_TABLET | Freq: Two times a day (BID) | ORAL | 3 refills | Status: DC

## 2022-02-19 NOTE — Telephone Encounter (Signed)
I received a message from Lynn Estrada stating pt stopped taking her metoprolol last week due to itching rash and fatigue.  Pt asked if she could be prescribed a different medication.  Routed to [employer]

## 2022-02-19 NOTE — Progress Notes (Signed)
[place], "Lynn Estrada says that she stopped taking her metoprolol a week ago due to a rash that developed on her leg itching  and increased fatigue. Iria said that she called Dr Bensimhon's office last week to notify. Will notify the CHF clinic on the patient's behalf and ask the office to contact the patient.Barnet Pall, RN,BSN 02/19/2022 9:48 AM

## 2022-02-19 NOTE — Telephone Encounter (Signed)
Pt aware and script sent to [facility] [address] as requested.

## 2022-02-20 NOTE — Progress Notes (Signed)
Reviewed home exercise Rx with patient today.  Encouraged warm-up, cool-down, and stretching. Reviewed THRR of 64 - 129 and keeping RPE between 11-13. Encouraged to hydrate with activity.  Reviewed weather parameters for temperature and humidity for safe exercise outdoors. Reviewed S/S to terminate exercise and when to call 911 vs MD. Pt encouraged to always carry a cell phone for safety when exercising outdoors. Pt verbalized understanding of the home exercise Rx and was provided a copy.   Lesly Rubenstein MS, ACSM-CEP, CCRP

## 2022-02-20 NOTE — Progress Notes (Signed)
Cardiac Individual Treatment Plan  Patient Details  Name: Lynn TROCHEZ MRN: [id number] Date of Birth: 1961-05-30 Referring Provider:   Flowsheet Row INTENSIVE CARDIAC REHAB ORIENT from 01/11/2022 in [place]  Referring Provider Glori Bickers, MD       Initial Encounter Date:  [facility] from 01/11/2022 in [place]  Date 01/11/22       Visit Diagnosis: Chronic combined systolic and diastolic heart failure ([place])  Patient's Home Medications on Admission:  Current Outpatient Medications:    acetaminophen (TYLENOL) 500 MG tablet, Take 500-1,000 mg by mouth every 6 (six) hours as needed (for pain.)., Disp: , Rfl:    aspirin 81 MG EC tablet, Take 81 mg by mouth daily with lunch., Disp: , Rfl:    atorvastatin (LIPITOR) 10 MG tablet, Take 10 mg by mouth daily., Disp: , Rfl:    carvedilol (COREG) 3.125 MG tablet, Take 1 tablet (3.125 mg total) by mouth 2 (two) times daily., Disp: 60 tablet, Rfl: 3   cyclobenzaprine (FLEXERIL) 5 MG tablet, Take 5-10 mg by mouth 3 (three) times daily as needed for spasms., Disp: , Rfl:    digoxin (LANOXIN) 0.125 MG tablet, Take 1 tablet (0.125 mg total) by mouth daily., Disp: 30 tablet, Rfl: 2   empagliflozin (JARDIANCE) 10 MG TABS tablet, Take 1 tablet (10 mg total) by mouth daily., Disp: 30 tablet, Rfl: 2   furosemide (LASIX) 40 MG tablet, Take 1 tablet (40 mg total) by mouth daily as needed., Disp: 30 tablet, Rfl: 5   HYDROcodone-acetaminophen (NORCO) 10-325 MG tablet, Take 0.5 tablets by mouth every 6 (six) hours as needed for pain., Disp: , Rfl:    Multiple Vitamin (MULTIVITAMIN WITH MINERALS) TABS tablet, Take 1 tablet by mouth daily with lunch. One A Day for Women 50+, Disp: , Rfl:    sacubitril-valsartan (ENTRESTO) 24-26 MG, Take 1 tablet by mouth 2 (two) times daily., Disp: 60 tablet, Rfl: 11   spironolactone (ALDACTONE) 25 MG tablet, Take 1  tablet (25 mg total) by mouth daily., Disp: 30 tablet, Rfl: 5  Past Medical History: Past Medical History:  Diagnosis Date   Anemia    Eczema    GERD (gastroesophageal reflux disease)    History of adenomatous polyp of colon    08-03-2016  tubular adenoma x2 and hyperplastic   History of chronic gastritis 08/03/2016   History of ectopic pregnancy 1988   s/p  left salpingectomy   History of endometriosis    History of kidney stones    History of partial nephrectomy    right pelvis for very large stone   History of pneumonia 07/02/2016   CAP   History of sepsis    07-01-2016  sepsis w/ pyelonephritis, CAP /   12-31-2016  urosepsis w/ kidney stone obstruction   History of uterine leiomyoma    Hypertension    Left ureteral stone    Nephrolithiasis    left obstructive stone and right non-obstructive stone  per CT 12-31-2016   Urgency of urination     Tobacco Use: Social History   Tobacco Use  Smoking Status Former   Packs/day: 0.75   Years: 5.00   Total pack years: 3.75   Types: Cigarettes   Quit date: 01/10/1997   Years since quitting: 25.1  Smokeless Tobacco Never    Labs: Review Flowsheet       Latest Ref Rng & Units 07/26/2015 02/01/2017 07/14/2021 07/18/2021  Labs  for ITP Cardiac and Pulmonary Rehab  Cholestrol 0 - 200 mg/dL 195  - 121  -  LDL (calc) 0 - 99 mg/dL 131  - 70  -  HDL-C >40 mg/dL 32  - 32  -  Trlycerides <150 mg/dL 161  - 95  -  Hemoglobin A1c 4.8 - 5.6 % - - 6.2  -  PH, Arterial 7.350 - 7.450 - - - 7.391   PCO2 arterial 32.0 - 48.0 mmHg - - - 44.3   Bicarbonate 20.0 - 28.0 mmol/L - - - 28.3  25.0  26.8   TCO2 22 - 32 mmol/L - 26  - '30  26  28   ' O2 Saturation % - - - 65.0  70.0  99.0     Capillary Blood Glucose: No results found for: "GLUCAP"   Exercise Target Goals: Exercise Program Goal: Individual exercise prescription set using results from initial 6 min walk test and THRR while considering  patient's activity barriers and safety.    Exercise Prescription Goal: Initial exercise prescription builds to 30-45 minutes a day of aerobic activity, 2-3 days per week.  Home exercise guidelines will be given to patient during program as part of exercise prescription that the participant will acknowledge.  Activity Barriers & Risk Stratification:  Activity Barriers & Cardiac Risk Stratification - 01/11/22 1557       Activity Barriers & Cardiac Risk Stratification   Activity Barriers Back Problems;Neck/Spine Problems;Joint Problems;Deconditioning;Muscular Weakness;Decreased Ventricular Function;Balance Concerns    Cardiac Risk Stratification High   under 5 mets on walk test            6 Minute Walk:  6 Minute Walk     Row Name 01/11/22 1555         6 Minute Walk   Phase Initial     Distance 1479 feet     Walk Time 6 minutes     # of Rest Breaks 0     MPH 2.8     METS 3.81     RPE 11     Perceived Dyspnea  0     VO2 Peak 13.32     Symptoms Yes (comment)     Comments left knee pain, 3/10, resolved with rest.     Resting HR 98 bpm     Resting BP 98/60     Resting Oxygen Saturation  98 %     Exercise Oxygen Saturation  during 6 min walk 100 %     Max Ex. HR 114 bpm     Max Ex. BP 104/62     2 Minute Post BP 98/58              Oxygen Initial Assessment:   Oxygen Re-Evaluation:   Oxygen Discharge (Final Oxygen Re-Evaluation):   Initial Exercise Prescription:  Initial Exercise Prescription - 01/11/22 1600       Date of Initial Exercise RX and Referring Provider   Date 01/11/22    Referring Provider Glori Bickers, MD    Expected Discharge Date 03/09/22      NuStep   Level 2    SPM 75    Minutes 15    METs 2.5      Arm Ergometer   Level 1    RPM 60    Minutes 15    METs 2      Prescription Details   Frequency (times per week) 3    Duration Progress to 30 minutes of continuous aerobic without  signs/symptoms of physical distress      Intensity   THRR 40-80% of Max Heartrate  64-129    Ratings of Perceived Exertion 11-13    Perceived Dyspnea 0-4      Progression   Progression Continue progressive overload as per policy without signs/symptoms or physical distress.      Resistance Training   Training Prescription Yes    Weight 2    Reps 10-15             Perform Capillary Blood Glucose checks as needed.  Exercise Prescription Changes:   Exercise Prescription Changes     Row Name 01/15/22 1400 02/07/22 1500 02/19/22 1015         Response to Exercise   Blood Pressure (Admit) 108/72 104/70 100/60     Blood Pressure (Exercise) 114/80 108/64 112/62     Blood Pressure (Exit) 100/68 104/72 106/58     Heart Rate (Admit) 85 bpm 81 bpm 82 bpm     Heart Rate (Exercise) 107 bpm 104 bpm 107 bpm     Heart Rate (Exit) 93 bpm 74 bpm 80 bpm     Rating of Perceived Exertion (Exercise) '11 11 12     ' Symptoms None None None     Comments Pt's first day in the CRP2 program. Reviewed METs Reviewed METs, goals and home exercise Rx     Duration Continue with 30 min of aerobic exercise without signs/symptoms of physical distress. Continue with 30 min of aerobic exercise without signs/symptoms of physical distress. Continue with 30 min of aerobic exercise without signs/symptoms of physical distress.     Intensity THRR unchanged THRR unchanged THRR unchanged       Progression   Progression Continue to progress workloads to maintain intensity without signs/symptoms of physical distress. Continue to progress workloads to maintain intensity without signs/symptoms of physical distress. Continue to progress workloads to maintain intensity without signs/symptoms of physical distress.     Average METs 1.4 1.8 2       Resistance Training   Training Prescription Yes No No     Weight 2 No weights on wednesdys 2 lbs     Reps 10-15 -- 10-15     Time 10 Minutes -- 10 Minutes       Interval Training   Interval Training No No No       NuStep   Level '2 2 3     ' SPM 57 -- --      Minutes '15 15 15     ' METs 1.5 2.1 2.1       Arm Ergometer   Level 1 2.1 3     Watts '1 9 14     ' RPM 43 -- --     Minutes '15 15 15     ' METs 1.05 1.49 1.77       Home Exercise Plan   Plans to continue exercise at -- -- [facility] (comment)     Frequency -- -- Add 2 additional days to program exercise sessions.     Initial Home Exercises Provided -- -- 02/19/22              Exercise Comments:   Exercise Comments     Row Name 01/15/22 1413 02/07/22 1531 02/19/22 1015       Exercise Comments Pt's first day in the CRP2 program. Pt tolerated session well with no complaints. Patient is deconditioned, average METs < 1.5. Reviewed METs. Pt making good progress. Voices does  not like the arm ergometer and will begin walking the track next session instead. Reviewed METs, goals and home exercise Rx. Pt verbalized understanding of the HEP and was provided a copy.              Exercise Goals and Review:   Exercise Goals     Row Name 01/11/22 1602             Exercise Goals   Increase Physical Activity Yes       Intervention Develop an individualized exercise prescription for aerobic and resistive training based on initial evaluation findings, risk stratification, comorbidities and participant's personal goals.;Provide advice, education, support and counseling about physical activity/exercise needs.       Expected Outcomes Short Term: Attend rehab on a regular basis to increase amount of physical activity.;Long Term: Add in home exercise to make exercise part of routine and to increase amount of physical activity.;Long Term: Exercising regularly at least 3-5 days a week.       Increase Strength and Stamina Yes       Intervention Provide advice, education, support and counseling about physical activity/exercise needs.;Develop an individualized exercise prescription for aerobic and resistive training based on initial evaluation findings, risk stratification, comorbidities and  participant's personal goals.       Expected Outcomes Short Term: Increase workloads from initial exercise prescription for resistance, speed, and METs.;Short Term: Perform resistance training exercises routinely during rehab and add in resistance training at home;Long Term: Improve cardiorespiratory fitness, muscular endurance and strength as measured by increased METs and functional capacity (6MWT)       Able to understand and use rate of perceived exertion (RPE) scale Yes       Intervention Provide education and explanation on how to use RPE scale       Expected Outcomes Short Term: Able to use RPE daily in rehab to express subjective intensity level;Long Term:  Able to use RPE to guide intensity level when exercising independently       Knowledge and understanding of Target Heart Rate Range (THRR) Yes       Intervention Provide education and explanation of THRR including how the numbers were predicted and where they are located for reference       Expected Outcomes Short Term: Able to state/look up THRR;Short Term: Able to use daily as guideline for intensity in rehab;Long Term: Able to use THRR to govern intensity when exercising independently       Understanding of Exercise Prescription Yes       Intervention Provide education, explanation, and written materials on patient's individual exercise prescription       Expected Outcomes Short Term: Able to explain program exercise prescription;Long Term: Able to explain home exercise prescription to exercise independently                Exercise Goals Re-Evaluation :  Exercise Goals Re-Evaluation     Row Name 01/15/22 1411 02/19/22 1015 02/20/22 0748         Exercise Goal Re-Evaluation   Exercise Goals Review Increase Physical Activity;Increase Strength and Stamina;Able to understand and use rate of perceived exertion (RPE) scale;Knowledge and understanding of Target Heart Rate Range (THRR);Understanding of Exercise Prescription Increase  Physical Activity;Increase Strength and Stamina;Able to understand and use rate of perceived exertion (RPE) scale;Knowledge and understanding of Target Heart Rate Range (THRR);Understanding of Exercise Prescription --     Comments Pt's first day in the CRP2 prgram. Pt understands the exercise Rx, THRR,  and RPE scale. Reviewed METs, goals and home exercise Rx. Pt voices improvement in strength and stamina. MET progression is slow. Pt is exercising 2 x /week for 30 minutes at the [facility] with her spouse. Pt is at goal for 150 minutes/week for exercise. --     Expected Outcomes Will continue to monitor and will progress exericse workloads as tolerated. Will continue to monitor and will progress exericse workloads as tolerated. --              Discharge Exercise Prescription (Final Exercise Prescription Changes):  Exercise Prescription Changes - 02/19/22 1015       Response to Exercise   Blood Pressure (Admit) 100/60    Blood Pressure (Exercise) 112/62    Blood Pressure (Exit) 106/58    Heart Rate (Admit) 82 bpm    Heart Rate (Exercise) 107 bpm    Heart Rate (Exit) 80 bpm    Rating of Perceived Exertion (Exercise) 12    Symptoms None    Comments Reviewed METs, goals and home exercise Rx    Duration Continue with 30 min of aerobic exercise without signs/symptoms of physical distress.    Intensity THRR unchanged      Progression   Progression Continue to progress workloads to maintain intensity without signs/symptoms of physical distress.    Average METs 2      Resistance Training   Training Prescription No    Weight 2 lbs    Reps 10-15    Time 10 Minutes      Interval Training   Interval Training No      NuStep   Level 3    Minutes 15    METs 2.1      Arm Ergometer   Level 3    Watts 14    Minutes 15    METs 1.77      Home Exercise Plan   Plans to continue exercise at [facility] (comment)    Frequency Add 2 additional days to program exercise sessions.    Initial  Home Exercises Provided 02/19/22             Nutrition:  Target Goals: Understanding of nutrition guidelines, daily intake of sodium <1528m, cholesterol <2066m calories 30% from fat and 7% or less from saturated fats, daily to have 5 or more servings of fruits and vegetables.  Biometrics:  Pre Biometrics - 01/11/22 1603       Pre Biometrics   Height '6\' 1"'  (1.854 m)    Weight 94.9 kg    Waist Circumference 40.75 inches    Hip Circumference 46.5 inches    Waist to Hip Ratio 0.88 %    BMI (Calculated) 27.61    Triceps Skinfold 22 mm    % Body Fat 37.8 %    Grip Strength 31 kg    Flexibility 13.75 in    Single Leg Stand 5.6 seconds              Nutrition Therapy Plan and Nutrition Goals:  Nutrition Therapy & Goals - 02/14/22 0955       Nutrition Therapy   Diet Heart Healthy Diet    Drug/Food Interactions Statins/Certain Fruits      Personal Nutrition Goals   Nutrition Goal Patient to choose a daily variety of fruits, vegetables, whole grains, lean protein/plant protein, nonfat dairy as part of heart healthy lifestyle.    Personal Goal #2 Patient to limit to <150069mf sodium per day.    Personal Goal #  3 Patient to identify and limit daily intake of saturated fat, trans fats, sodium, and refined carbohydrates.    Comments Patient has made many dietary changes with great support from family. She continues to increase non-starchy vegetables, reading labels for sodium, and choosing fish >2-3x/week. She reports improved confidence in nutrition changes.      Intervention Plan   Intervention Prescribe, educate and counsel regarding individualized specific dietary modifications aiming towards targeted core components such as weight, hypertension, lipid management, diabetes, heart failure and other comorbidities.;Nutrition handout(s) given to patient.    Expected Outcomes Short Term Goal: Understand basic principles of dietary content, such as calories, fat, sodium,  cholesterol and nutrients.;Long Term Goal: Adherence to prescribed nutrition plan.             Nutrition Assessments:  Nutrition Assessments - 01/15/22 1157       Rate Your Plate Scores   Pre Score 54            MEDIFICTS Score Key: ?70 Need to make dietary changes  40-70 Heart Healthy Diet ? 40 Therapeutic Level Cholesterol Diet   Flowsheet Row INTENSIVE CARDIAC REHAB from 01/15/2022 in [place]  Picture Your Plate Total Score on Admission 54      Picture Your Plate Scores: <88 Unhealthy dietary pattern with much room for improvement. 41-50 Dietary pattern unlikely to meet recommendations for good health and room for improvement. 51-60 More healthful dietary pattern, with some room for improvement.  >60 Healthy dietary pattern, although there may be some specific behaviors that could be improved.    Nutrition Goals Re-Evaluation:  Nutrition Goals Re-Evaluation     [place] Name 01/15/22 1148 02/14/22 0955           Goals   Current Weight 212 lb 8.4 oz (96.4 kg) 210 lb 15.7 oz (95.7 kg)      Comment A1c 6.4 No new A1c labs; however, fasting glucose has improved to normal. Expect improvement to A1c with continued dietary changes.      Expected Outcome -- Goals in progress. Patient has made many dietary changes with great support from family. She continues to increase non-starchy vegetables, reading labels for sodium, and choosing fish >2-3x/week. She reports improved confidence in nutrition changes.               Nutrition Goals Re-Evaluation:  Nutrition Goals Re-Evaluation     [place] Name 01/15/22 1148 02/14/22 0955           Goals   Current Weight 212 lb 8.4 oz (96.4 kg) 210 lb 15.7 oz (95.7 kg)      Comment A1c 6.4 No new A1c labs; however, fasting glucose has improved to normal. Expect improvement to A1c with continued dietary changes.      Expected Outcome -- Goals in progress. Patient has made many dietary changes with  great support from family. She continues to increase non-starchy vegetables, reading labels for sodium, and choosing fish >2-3x/week. She reports improved confidence in nutrition changes.               Nutrition Goals Discharge (Final Nutrition Goals Re-Evaluation):  Nutrition Goals Re-Evaluation - 02/14/22 0955       Goals   Current Weight 210 lb 15.7 oz (95.7 kg)    Comment No new A1c labs; however, fasting glucose has improved to normal. Expect improvement to A1c with continued dietary changes.    Expected Outcome Goals in progress. Patient has made many dietary changes  with great support from family. She continues to increase non-starchy vegetables, reading labels for sodium, and choosing fish >2-3x/week. She reports improved confidence in nutrition changes.             Psychosocial: Target Goals: Acknowledge presence or absence of significant depression and/or stress, maximize coping skills, provide positive support system. Participant is able to verbalize types and ability to use techniques and skills needed for reducing stress and depression.  Initial Review & Psychosocial Screening:  Initial Psych Review & Screening - 01/11/22 1630       Initial Review   Current issues with Current Stress Concerns    Source of Stress Concerns Financial    Comments Mahli is out on short term disability which ends soon.  Applied for long term disability. Struggles financially short term disability is not covering household needs and demands      [place]? Yes    Comments Feels supported by family and friends      Barriers   Psychosocial barriers to participate in program The patient should benefit from training in stress management and relaxation.      Screening Interventions   Interventions Encouraged to exercise;Provide feedback about the scores to participant    Expected Outcomes Short Term goal: Identification and review with participant of any  Quality of Life or Depression concerns found by scoring the questionnaire.;Long Term Goal: Stressors or current issues are controlled or eliminated.;Long Term goal: The participant improves quality of Life and PHQ9 Scores as seen by post scores and/or verbalization of changes             Quality of Life Scores:  Quality of Life - 01/17/22 0939       Quality of Life   Select Quality of Life      Quality of Life Scores   Health/Function Pre 21.2 %    Socioeconomic Pre 19.69 %    Psych/Spiritual Pre 24.86 %    Family Pre 27.6 %    GLOBAL Pre 22.5 %            Scores of 19 and below usually indicate a poorer quality of life in these areas.  A difference of  2-3 points is a clinically meaningful difference.  A difference of 2-3 points in the total score of the Quality of Life Index has been associated with significant improvement in overall quality of life, self-image, physical symptoms, and general health in studies assessing change in quality of life.  PHQ-9: Review Flowsheet  More data exists      01/11/2022 10/29/2017 09/11/2017 08/07/2017 08/01/2016  Depression screen PHQ 2/9  Decreased Interest 0 0 0 0 0  Down, Depressed, Hopeless 1 0 0 0 0  PHQ - 2 Score 1 0 0 0 0  Altered sleeping 0 - - - -  Tired, decreased energy 1 - - - -  Change in appetite 0 - - - -  Feeling bad or failure about yourself  0 - - - -  Trouble concentrating 0 - - - -  Moving slowly or fidgety/restless 0 - - - -  Suicidal thoughts 0 - - - -  PHQ-9 Score 2 - - - -  Difficult doing work/chores Somewhat difficult - - - -   Interpretation of Total Score  Total Score Depression Severity:  1-4 = Minimal depression, 5-9 = Mild depression, 10-14 = Moderate depression, 15-19 = Moderately severe depression, 20-27 = Severe depression   Psychosocial  Evaluation and Intervention:   Psychosocial Re-Evaluation:  Psychosocial Re-Evaluation     [place] Name 01/16/22 0753 01/23/22 1707 02/20/22 1733          Psychosocial Re-Evaluation   Current issues with Current Stress Concerns Current Stress Concerns Current Stress Concerns     Comments Girtha did not voice any concerns or stressors on her first day of exercise at cardiac rehab [place] has not voiced any increased concerns or stressors. Quality of life questionnaire reviewed on 01/17/22 [place] has not voiced any increased concerns or stressors.     Expected Outcomes [place] will have decreased or controlled stress upon completion of traditional cardiac rehab. [place] will have decreased or controlled stress upon completion of traditional cardiac rehab. [place] will have decreased or controlled stress upon completion of traditional cardiac rehab.     Interventions Encouraged to attend Cardiac Rehabilitation for the exercise Encouraged to attend Cardiac Rehabilitation for the exercise Encouraged to attend Cardiac Rehabilitation for the exercise     Continue Psychosocial Services  No Follow up required No Follow up required No Follow up required       Initial Review   Source of Stress Concerns Financial Financial Financial     Comments Will continue to monitor and offer support as needed Will continue to monitor and offer support as needed Will continue to monitor and offer support as needed              Psychosocial Discharge (Final Psychosocial Re-Evaluation):  Psychosocial Re-Evaluation - 02/20/22 1733       Psychosocial Re-Evaluation   Current issues with Current Stress Concerns    Comments Naydeline has not voiced any increased concerns or stressors.    Expected Outcomes [place] will have decreased or controlled stress upon completion of traditional cardiac rehab.    Interventions Encouraged to attend Cardiac Rehabilitation for the exercise    Continue Psychosocial Services  No Follow up required      Initial Review   Source of Stress Concerns Financial    Comments Will continue to monitor and offer support as needed              Vocational Rehabilitation: Provide vocational rehab assistance to qualifying candidates.   Vocational Rehab Evaluation & Intervention:  Vocational Rehab - 01/11/22 1632       Initial Vocational Rehab Evaluation & Intervention   Assessment shows need for Vocational Rehabilitation No             Education: Education Goals: Education classes will be provided on a weekly basis, covering required topics. Participant will state understanding/return demonstration of topics presented.    Education     Row Name 01/15/22 0900     Education   Cardiac Education Topics Pritikin   Select Workshops     Workshops   Educator Exercise Physiologist   Select Exercise   Exercise Workshop Exercise Basics: Building Your Action Plan   Instruction Review Code 1- Verbalizes Understanding   Class Start Time 0815   Class Stop Time 9702   Class Time Calculation (min) 40 min    [place] Name 01/17/22 0900     Education   Cardiac Education Topics [employer]   Educator Dietitian   Weekly Topic Powerhouse Plant-Based Proteins   Instruction Review Code 1- Verbalizes Understanding   Class Start Time 0815   Class Stop Time 6378   Class Time Calculation (min) 42 min    Row  Name 01/22/22 0900     Education   Cardiac Education Topics Pritikin   Select Core Videos     Core Videos   Educator Exercise Physiologist   Select Nutrition   Nutrition Nutrition Action Plan   Instruction Review Code 1- Verbalizes Understanding   Class Start Time [phone]   Class Stop Time 0855   Class Time Calculation (min) 43 min    [place] Name 01/24/22 0900     Education   Cardiac Education Topics [employer]   Educator Exercise Physiologist   Weekly Topic Simple Sides and Sauces   Instruction Review Code 1- Verbalizes Understanding   Class Start Time 0810   Class Stop Time 0855   Class Time Calculation (min) 45 min    [place]  Name 01/26/22 0900     Education   Cardiac Education Topics Pritikin   Select Core Videos     Core Videos   Educator Nurse   Select General Education   General Education Hypertension and Heart Disease   Instruction Review Code 1- Verbalizes Understanding   Class Start Time [phone]   Class Stop Time 0900   Class Time Calculation (min) 46 min    [place] Name 01/29/22 0900     Education   Cardiac Education Topics Pritikin   Select Workshops     Core Videos   Educator --   Select --   Psychosocial --   Instruction Review Code --   Class Start Time --   Class Stop Time --   Class Time Calculation (min) --     Workshops   Dentist Psychosocial   Psychosocial Workshop New Thoughts, New Behaviors   Instruction Review Code 1- Verbalizes Understanding   Class Start Time [phone]   Class Stop Time 0848   Class Time Calculation (min) 36 min    [place] Name 01/31/22 1100     Education   Cardiac Education Topics [employer]   Educator Dietitian   Weekly Topic One-Pot Wonders   Instruction Review Code 1- Verbalizes Understanding   Class Start Time 0815   Class Stop Time 0900   Class Time Calculation (min) 45 min    [place] Name 02/02/22 1000     Education   Cardiac Education Topics Pritikin   Select Core Videos     Core Videos   Educator Dietitian   Select Nutrition   Nutrition Dining Out - Part 1   Instruction Review Code 1- Verbalizes Understanding   Class Start Time [phone]   Class Stop Time 0857   Class Time Calculation (min) 43 min    [place] Name 02/05/22 0900     Education   Cardiac Education Topics Pritikin   Select Core Videos     Core Videos   Educator Exercise Physiologist   Select Exercise Education   Exercise Education Biomechanial Limitations   Instruction Review Code 1- Verbalizes Understanding   Class Start Time 0810   Class Stop Time 1950   Class Time Calculation (min) 45 min    [place] Name  02/07/22 1100     Education   Cardiac Education Topics Pritikin   Financial trader   Weekly Topic Fast Evening Meals   Instruction Review Code 1- Verbalizes Understanding   Class Start Time 0815   Class Stop  Time 0904   Class Time Calculation (min) 49 min    [place] Name 02/09/22 0900     Education   Cardiac Education Topics Pritikin   Select Core Videos     Core Videos   Educator Nurse   Select Psychosocial   Psychosocial How Our Thoughts Can Heal Our Hearts   Instruction Review Code 1- Verbalizes Understanding   Class Start Time 0815   Class Stop Time 3354   Class Time Calculation (min) 40 min    [place] Name 02/12/22 1000     Education   Cardiac Education Topics Pritikin   IT sales professional Nutrition   Nutrition Workshop Fueling a Designer, multimedia   Instruction Review Code 1- Verbalizes Understanding   Class Start Time 0815   Class Stop Time 0904   Class Time Calculation (min) 49 min    [place] Name 02/14/22 0900     Education   Cardiac Education Topics [employer]   Educator Dietitian   Weekly Topic Adding Flavor - Sodium-Free   Instruction Review Code 1- Verbalizes Understanding   Class Start Time 0815   Class Stop Time 0857   Class Time Calculation (min) 42 min    [place] Name 02/16/22 0900     Education   Cardiac Education Topics Pritikin   Select Core Videos     Core Videos   Educator Nurse   Select General Education   General Education Heart Disease Risk Reduction   Instruction Review Code 1- Verbalizes Understanding   Class Start Time 0815   Class Stop Time 5625   Class Time Calculation (min) 40 min    [place] Name 02/19/22 0900     Education   Cardiac Education Topics Pritikin   Select Workshops     Workshops   Educator Exercise Physiologist   Select Psychosocial   Psychosocial Workshop Recognizing and Reducing Stress    Instruction Review Code 1- Verbalizes Understanding   Class Start Time 0810   Class Stop Time 0904   Class Time Calculation (min) 54 min            Core Videos: Exercise    Move It!  Clinical staff conducted group or individual video education with verbal and written material and guidebook.  Patient learns the recommended Pritikin exercise program. Exercise with the goal of living a long, healthy life. Some of the health benefits of exercise include controlled diabetes, healthier blood pressure levels, improved cholesterol levels, improved heart and lung capacity, improved sleep, and better body composition. Everyone should speak with their doctor before starting or changing an exercise routine.  Biomechanical Limitations Clinical staff conducted group or individual video education with verbal and written material and guidebook.  Patient learns how biomechanical limitations can impact exercise and how we can mitigate and possibly overcome limitations to have an impactful and balanced exercise routine.  Body Composition Clinical staff conducted group or individual video education with verbal and written material and guidebook.  Patient learns that body composition (ratio of muscle mass to fat mass) is a key component to assessing overall fitness, rather than body weight alone. Increased fat mass, especially visceral belly fat, can put [place] at increased risk for metabolic syndrome, type 2 diabetes, heart disease, and even death. It is recommended to combine diet and exercise (cardiovascular and resistance training) to improve your body composition. Seek guidance from your physician  and exercise physiologist before implementing an exercise routine.  Exercise Action Plan Clinical staff conducted group or individual video education with verbal and written material and guidebook.  Patient learns the recommended strategies to achieve and enjoy long-term exercise adherence, including variety,  self-motivation, self-efficacy, and positive decision making. Benefits of exercise include fitness, good health, weight management, more energy, better sleep, less stress, and overall well-being.  Medical   Heart Disease Risk Reduction Clinical staff conducted group or individual video education with verbal and written material and guidebook.  Patient learns our heart is our most vital organ as it circulates oxygen, nutrients, white blood cells, and hormones throughout the entire body, and carries waste away. Data supports a plant-based eating plan like the Pritikin Program for its effectiveness in slowing progression of and reversing heart disease. The video provides a number of recommendations to address heart disease.   Metabolic Syndrome and Belly Fat  Clinical staff conducted group or individual video education with verbal and written material and guidebook.  Patient learns what metabolic syndrome is, how it leads to heart disease, and how one can reverse it and keep it from coming back. You have metabolic syndrome if you have 3 of the following 5 criteria: abdominal obesity, high blood pressure, high triglycerides, low HDL cholesterol, and high blood sugar.  Hypertension and Heart Disease Clinical staff conducted group or individual video education with verbal and written material and guidebook.  Patient learns that high blood pressure, or hypertension, is very common in the [place]. Hypertension is largely due to excessive salt intake, but other important risk factors include being overweight, physical inactivity, drinking too much alcohol, smoking, and not eating enough potassium from fruits and vegetables. High blood pressure is a leading risk factor for heart attack, stroke, congestive heart failure, dementia, kidney failure, and premature death. Long-term effects of excessive salt intake include stiffening of the arteries and thickening of heart muscle and organ damage. Recommendations  include ways to reduce hypertension and the risk of heart disease.  Diseases of Our Time - Focusing on Diabetes Clinical staff conducted group or individual video education with verbal and written material and guidebook.  Patient learns why the best way to stop diseases of our time is prevention, through food and other lifestyle changes. Medicine (such as prescription pills and surgeries) is often only a Band-Aid on the problem, not a long-term solution. Most common diseases of our time include obesity, type 2 diabetes, hypertension, heart disease, and cancer. The Pritikin Program is recommended and has been proven to help reduce, reverse, and/or prevent the damaging effects of metabolic syndrome.  Nutrition   Overview of the Pritikin Eating Plan  Clinical staff conducted group or individual video education with verbal and written material and guidebook.  Patient learns about the [place] for disease risk reduction. The [place] emphasizes a wide variety of unrefined, minimally-processed carbohydrates, like fruits, vegetables, whole grains, and legumes. Go, Caution, and Stop food choices are explained. Plant-based and lean animal proteins are emphasized. Rationale provided for low sodium intake for blood pressure control, low added sugars for blood sugar stabilization, and low added fats and oils for coronary artery disease risk reduction and weight management.  Calorie Density  Clinical staff conducted group or individual video education with verbal and written material and guidebook.  Patient learns about calorie density and how it impacts the Pritikin Eating Plan. Knowing the characteristics of the food you choose will help you decide whether those foods will  lead to weight gain or weight loss, and whether you want to consume more or less of them. Weight loss is usually a side effect of the Pritikin Eating Plan because of its focus on low calorie-dense foods.  Label Reading   Clinical staff conducted group or individual video education with verbal and written material and guidebook.  Patient learns about the Pritikin recommended label reading guidelines and corresponding recommendations regarding calorie density, added sugars, sodium content, and whole grains.  Dining Out - Part 1  Clinical staff conducted group or individual video education with verbal and written material and guidebook.  Patient learns that restaurant meals can be sabotaging because they can be so high in calories, fat, sodium, and/or sugar. Patient learns recommended strategies on how to positively address this and avoid unhealthy pitfalls.  Facts on Fats  Clinical staff conducted group or individual video education with verbal and written material and guidebook.  Patient learns that lifestyle modifications can be just as effective, if not more so, as many medications for lowering your risk of heart disease. A Pritikin lifestyle can help to reduce your risk of inflammation and atherosclerosis (cholesterol build-up, or plaque, in the artery walls). Lifestyle interventions such as dietary choices and physical activity address the cause of atherosclerosis. A review of the types of fats and their impact on blood cholesterol levels, along with dietary recommendations to reduce fat intake is also included.  Nutrition Action Plan  Clinical staff conducted group or individual video education with verbal and written material and guidebook.  Patient learns how to incorporate Pritikin recommendations into their lifestyle. Recommendations include planning and keeping personal health goals in mind as an important part of their success.  Healthy Mind-Set    Healthy Minds, Bodies, Hearts  Clinical staff conducted group or individual video education with verbal and written material and guidebook.  Patient learns how to identify when they are stressed. Video will discuss the impact of that stress, as well as the  many benefits of stress management. Patient will also be introduced to stress management techniques. The way we think, act, and feel has an impact on our hearts.  How Our Thoughts Can Heal Our Hearts  Clinical staff conducted group or individual video education with verbal and written material and guidebook.  Patient learns that negative thoughts can cause depression and anxiety. This can result in negative lifestyle behavior and serious health problems. Cognitive behavioral therapy is an effective method to help control our thoughts in order to change and improve our emotional outlook.  Additional Videos:  Exercise    Improving Performance  Clinical staff conducted group or individual video education with verbal and written material and guidebook.  Patient learns to use a non-linear approach by alternating intensity levels and lengths of time spent exercising to help burn more calories and lose more body fat. Cardiovascular exercise helps improve heart health, metabolism, hormonal balance, blood sugar control, and recovery from fatigue. Resistance training improves strength, endurance, balance, coordination, reaction time, metabolism, and muscle mass. Flexibility exercise improves circulation, posture, and balance. Seek guidance from your physician and exercise physiologist before implementing an exercise routine and learn your capabilities and proper form for all exercise.  Introduction to Yoga  Clinical staff conducted group or individual video education with verbal and written material and guidebook.  Patient learns about yoga, a discipline of the coming together of mind, breath, and body. The benefits of yoga include improved flexibility, improved range of motion, better posture and core strength, increased  lung function, weight loss, and positive self-image. Yoga's heart health benefits include lowered blood pressure, healthier heart rate, decreased cholesterol and triglyceride levels, improved  immune function, and reduced stress. Seek guidance from your physician and exercise physiologist before implementing an exercise routine and learn your capabilities and proper form for all exercise.  Medical   Aging: Enhancing Your Quality of Life  Clinical staff conducted group or individual video education with verbal and written material and guidebook.  Patient learns key strategies and recommendations to stay in good physical health and enhance quality of life, such as prevention strategies, having an advocate, securing a [place], and keeping a list of medications and system for tracking them. It also discusses how to avoid risk for bone loss.  Biology of Weight Control  Clinical staff conducted group or individual video education with verbal and written material and guidebook.  Patient learns that weight gain occurs because we consume more calories than we burn (eating more, moving less). Even if your body weight is normal, you may have higher ratios of fat compared to muscle mass. Too much body fat puts you at increased risk for cardiovascular disease, heart attack, stroke, type 2 diabetes, and obesity-related cancers. In addition to exercise, following the [place] can help reduce your risk.  Decoding Lab Results  Clinical staff conducted group or individual video education with verbal and written material and guidebook.  Patient learns that lab test reflects one measurement whose values change over time and are influenced by many factors, including medication, stress, sleep, exercise, food, hydration, pre-existing medical conditions, and more. It is recommended to use the knowledge from this video to become more involved with your lab results and evaluate your numbers to speak with your doctor.   Diseases of Our Time - Overview  Clinical staff conducted group or individual video education with verbal and written material and guidebook.  Patient  learns that according to the CDC, 50% to 70% of chronic diseases (such as obesity, type 2 diabetes, elevated lipids, hypertension, and heart disease) are avoidable through lifestyle improvements including healthier food choices, listening to satiety cues, and increased physical activity.  Sleep Disorders Clinical staff conducted group or individual video education with verbal and written material and guidebook.  Patient learns how good quality and duration of sleep are important to overall health and well-being. Patient also learns about sleep disorders and how they impact health along with recommendations to address them, including discussing with a physician.  Nutrition  Dining Out - Part 2 Clinical staff conducted group or individual video education with verbal and written material and guidebook.  Patient learns how to plan ahead and communicate in order to maximize their dining experience in a healthy and nutritious manner. Included are recommended food choices based on the type of restaurant the patient is visiting.   Fueling a Best boy conducted group or individual video education with verbal and written material and guidebook.  There is a strong connection between our food choices and our health. Diseases like obesity and type 2 diabetes are very prevalent and are in large-part due to lifestyle choices. The Pritikin Eating Plan provides plenty of food and hunger-curbing satisfaction. It is easy to follow, affordable, and helps reduce health risks.  Menu Workshop  Clinical staff conducted group or individual video education with verbal and written material and guidebook.  Patient learns that restaurant meals can sabotage health goals because they are often packed  with calories, fat, sodium, and sugar. Recommendations include strategies to plan ahead and to communicate with the manager, chef, or server to help order a healthier meal.  Planning Your Eating Strategy   Clinical staff conducted group or individual video education with verbal and written material and guidebook.  Patient learns about the [place] and its benefit of reducing the risk of disease. The [place] does not focus on calories. Instead, it emphasizes high-quality, nutrient-rich foods. By knowing the characteristics of the foods, we choose, we can determine their calorie density and make informed decisions.  Targeting Your Nutrition Priorities  Clinical staff conducted group or individual video education with verbal and written material and guidebook.  Patient learns that lifestyle habits have a tremendous impact on disease risk and progression. This video provides eating and physical activity recommendations based on your personal health goals, such as reducing LDL cholesterol, losing weight, preventing or controlling type 2 diabetes, and reducing high blood pressure.  Vitamins and Minerals  Clinical staff conducted group or individual video education with verbal and written material and guidebook.  Patient learns different ways to obtain key vitamins and minerals, including through a recommended healthy diet. It is important to discuss all supplements you take with your doctor.   Healthy Mind-Set    Smoking Cessation  Clinical staff conducted group or individual video education with verbal and written material and guidebook.  Patient learns that cigarette smoking and tobacco addiction pose a serious health risk which affects millions of people. Stopping smoking will significantly reduce the risk of heart disease, lung disease, and many forms of cancer. Recommended strategies for quitting are covered, including working with your doctor to develop a successful plan.  Culinary   Becoming a Financial trader conducted group or individual video education with verbal and written material and guidebook.  Patient learns that cooking at home can be healthy,  cost-effective, quick, and puts them in control. Keys to cooking healthy recipes will include looking at your recipe, assessing your equipment needs, planning ahead, making it simple, choosing cost-effective seasonal ingredients, and limiting the use of added fats, salts, and sugars.  Cooking - Breakfast and Snacks  Clinical staff conducted group or individual video education with verbal and written material and guidebook.  Patient learns how important breakfast is to satiety and nutrition through the entire day. Recommendations include key foods to eat during breakfast to help stabilize blood sugar levels and to prevent overeating at meals later in the day. Planning ahead is also a key component.  Cooking - Human resources officer conducted group or individual video education with verbal and written material and guidebook.  Patient learns eating strategies to improve overall health, including an approach to cook more at home. Recommendations include thinking of animal protein as a side on your plate rather than center stage and focusing instead on lower calorie dense options like vegetables, fruits, whole grains, and plant-based proteins, such as beans. Making sauces in large quantities to freeze for later and leaving the skin on your vegetables are also recommended to maximize your experience.  Cooking - Healthy Salads and Dressing Clinical staff conducted group or individual video education with verbal and written material and guidebook.  Patient learns that vegetables, fruits, whole grains, and legumes are the foundations of the [place]. Recommendations include how to incorporate each of these in flavorful and healthy salads, and how to create homemade salad dressings. Proper handling of ingredients is  also covered. Cooking - Soups and [employer] - Soups and Desserts Clinical staff conducted group or individual video education with verbal and written material and  guidebook.  Patient learns that Pritikin soups and desserts make for easy, nutritious, and delicious snacks and meal components that are low in sodium, fat, sugar, and calorie density, while high in vitamins, minerals, and filling fiber. Recommendations include simple and healthy ideas for soups and desserts.   Overview     The Pritikin Solution Program Overview Clinical staff conducted group or individual video education with verbal and written material and guidebook.  Patient learns that the results of the [place] Program have been documented in more than 100 articles published in peer-reviewed journals, and the benefits include reducing risk factors for (and, in some cases, even reversing) high cholesterol, high blood pressure, type 2 diabetes, obesity, and more! An overview of the three key pillars of the Pritikin Program will be covered: eating well, doing regular exercise, and having a healthy mind-set.  WORKSHOPS  Exercise: Exercise Basics: Building Your Action Plan Clinical staff led group instruction and group discussion with PowerPoint presentation and patient guidebook. To enhance the learning environment the use of posters, models and videos may be added. At the conclusion of this workshop, patients will comprehend the difference between physical activity and exercise, as well as the benefits of incorporating both, into their routine. Patients will understand the FITT (Frequency, Intensity, Time, and Type) principle and how to use it to build an exercise action plan. In addition, safety concerns and other considerations for exercise and cardiac rehab will be addressed by the presenter. The purpose of this lesson is to promote a comprehensive and effective weekly exercise routine in order to improve patients' overall level of fitness.   Managing Heart Disease: Your Path to a Healthier Heart Clinical staff led group instruction and group discussion with PowerPoint presentation and  patient guidebook. To enhance the learning environment the use of posters, models and videos may be added.At the conclusion of this workshop, patients will understand the anatomy and physiology of the heart. Additionally, they will understand how Pritikin's three pillars impact the risk factors, the progression, and the management of heart disease.  The purpose of this lesson is to provide a high-level overview of the heart, heart disease, and how the Pritikin lifestyle positively impacts risk factors.  Exercise Biomechanics Clinical staff led group instruction and group discussion with PowerPoint presentation and patient guidebook. To enhance the learning environment the use of posters, models and videos may be added. Patients will learn how the structural parts of their bodies function and how these functions impact their daily activities, movement, and exercise. Patients will learn how to promote a neutral spine, learn how to manage pain, and identify ways to improve their physical movement in order to promote healthy living. The purpose of this lesson is to expose patients to common physical limitations that impact physical activity. Participants will learn practical ways to adapt and manage aches and pains, and to minimize their effect on regular exercise. Patients will learn how to maintain good posture while sitting, walking, and lifting.  Balance Training and Fall Prevention  Clinical staff led group instruction and group discussion with PowerPoint presentation and patient guidebook. To enhance the learning environment the use of posters, models and videos may be added. At the conclusion of this workshop, patients will understand the importance of their sensorimotor skills (vision, proprioception, and the vestibular system) in maintaining their ability to balance  as they age. Patients will apply a variety of balancing exercises that are appropriate for their current level of function.  Patients will understand the common causes for poor balance, possible solutions to these problems, and ways to modify their physical environment in order to minimize their fall risk. The purpose of this lesson is to teach patients about the importance of maintaining balance as they age and ways to minimize their risk of falling.  WORKSHOPS   Nutrition:  Fueling a Scientist, research (physical sciences) led group instruction and group discussion with PowerPoint presentation and patient guidebook. To enhance the learning environment the use of posters, models and videos may be added. Patients will review the foundational principles of the [place] and understand what constitutes a serving size in each of the food groups. Patients will also learn Pritikin-friendly foods that are better choices when away from home and review make-ahead meal and snack options. Calorie density will be reviewed and applied to three nutrition priorities: weight maintenance, weight loss, and weight gain. The purpose of this lesson is to reinforce (in a group setting) the key concepts around what patients are recommended to eat and how to apply these guidelines when away from home by planning and selecting Pritikin-friendly options. Patients will understand how calorie density may be adjusted for different weight management goals.  Mindful Eating  Clinical staff led group instruction and group discussion with PowerPoint presentation and patient guidebook. To enhance the learning environment the use of posters, models and videos may be added. Patients will briefly review the concepts of the [place] and the importance of low-calorie dense foods. The concept of mindful eating will be introduced as well as the importance of paying attention to internal hunger signals. Triggers for non-hunger eating and techniques for dealing with triggers will be explored. The purpose of this lesson is to provide patients with the  opportunity to review the basic principles of the [place], discuss the value of eating mindfully and how to measure internal cues of hunger and fullness using the Hunger Scale. Patients will also discuss reasons for non-hunger eating and learn strategies to use for controlling emotional eating.  Targeting Your Nutrition Priorities Clinical staff led group instruction and group discussion with PowerPoint presentation and patient guidebook. To enhance the learning environment the use of posters, models and videos may be added. Patients will learn how to determine their genetic susceptibility to disease by reviewing their family history. Patients will gain insight into the importance of diet as part of an overall healthy lifestyle in mitigating the impact of genetics and other environmental insults. The purpose of this lesson is to provide patients with the opportunity to assess their personal nutrition priorities by looking at their family history, their own health history and current risk factors. Patients will also be able to discuss ways of prioritizing and modifying the [place] for their highest risk areas  Menu  Clinical staff led group instruction and group discussion with PowerPoint presentation and patient guidebook. To enhance the learning environment the use of posters, models and videos may be added. Using menus brought in from [employer], or printed from [employer], patients will apply the [place] dining out guidelines that were presented in the [employer] video. Patients will also be able to practice these guidelines in a variety of provided scenarios. The purpose of this lesson is to provide patients with the opportunity to practice hands-on learning of the [employer]  guidelines with actual menus and practice scenarios.  Label Reading Clinical staff led group instruction and group discussion with PowerPoint presentation and  patient guidebook. To enhance the learning environment the use of posters, models and videos may be added. Patients will review and discuss the Pritikin label reading guidelines presented in Pritikin's Label Reading Educational series video. Using fool labels brought in from local grocery stores and markets, patients will apply the label reading guidelines and determine if the packaged food meet the Pritikin guidelines. The purpose of this lesson is to provide patients with the opportunity to review, discuss, and practice hands-on learning of the Pritikin Label Reading guidelines with actual packaged food labels. [place] Workshops are designed to teach patients ways to prepare quick, simple, and affordable recipes at home. The importance of nutrition's role in chronic disease risk reduction is reflected in its emphasis in the overall Pritikin program. By learning how to prepare essential core Pritikin Eating Plan recipes, patients will increase control over what they eat; be able to customize the flavor of foods without the use of added salt, sugar, or fat; and improve the quality of the food they consume. By learning a set of core recipes which are easily assembled, quickly prepared, and affordable, patients are more likely to prepare more healthy foods at home. These workshops focus on convenient breakfasts, simple entres, side dishes, and desserts which can be prepared with minimal effort and are consistent with nutrition recommendations for cardiovascular risk reduction. Cooking [employer] are taught by a Engineer, materials (RD) who has been trained by the [employer]. The chef or RD has a clear understanding of the importance of minimizing - if not completely eliminating - added fat, sugar, and sodium in recipes. Throughout the series of [place] Workshop sessions, patients will learn about healthy ingredients and efficient methods of  cooking to build confidence in their capability to prepare    Cooking School weekly topics:  Adding Flavor- Sodium-Free  Fast and Healthy Breakfasts  Powerhouse Plant-Based Proteins  Satisfying Salads and Dressings  Simple Sides and Sauces  International Cuisine-Spotlight on the [place] Zones  Delicious Desserts  Savory Soups  Efficiency Cooking - Meals in a Snap  Tasty Appetizers and Snacks  Comforting Weekend Breakfasts  One-Pot Wonders   Fast Evening Meals  Easy [place] (Psychosocial): New Thoughts, New Behaviors Clinical staff led group instruction and group discussion with PowerPoint presentation and patient guidebook. To enhance the learning environment the use of posters, models and videos may be added. Patients will learn and practice techniques for developing effective health and lifestyle goals. Patients will be able to effectively apply the goal setting process learned to develop at least one new personal goal.  The purpose of this lesson is to expose patients to a new skill set of behavior modification techniques such as techniques setting SMART goals, overcoming barriers, and achieving new thoughts and new behaviors.  Managing Moods and Relationships Clinical staff led group instruction and group discussion with PowerPoint presentation and patient guidebook. To enhance the learning environment the use of posters, models and videos may be added. Patients will learn how emotional and chronic stress factors can impact their health and relationships. They will learn healthy ways to manage their moods and utilize positive coping mechanisms. In addition, ICR patients will learn ways to improve communication skills. The purpose of this lesson is to expose patients  to ways of understanding how one's mood and health are intimately connected. Developing a healthy outlook can help build positive relationships and  connections with others. Patients will understand the importance of utilizing effective communication skills that include actively listening and being heard. They will learn and understand the importance of the "4 Cs" and especially Connections in fostering of a Healthy Mind-Set.  Healthy Sleep for a Healthy Heart Clinical staff led group instruction and group discussion with PowerPoint presentation and patient guidebook. To enhance the learning environment the use of posters, models and videos may be added. At the conclusion of this workshop, patients will be able to demonstrate knowledge of the importance of sleep to overall health, well-being, and quality of life. They will understand the symptoms of, and treatments for, common sleep disorders. Patients will also be able to identify daytime and nighttime behaviors which impact sleep, and they will be able to apply these tools to help manage sleep-related challenges. The purpose of this lesson is to provide patients with a general overview of sleep and outline the importance of quality sleep. Patients will learn about a few of the most common sleep disorders. Patients will also be introduced to the concept of "sleep hygiene," and discover ways to self-manage certain sleeping problems through simple daily behavior changes. Finally, the workshop will motivate patients by clarifying the links between quality sleep and their goals of heart-healthy living.   Recognizing and Reducing Stress Clinical staff led group instruction and group discussion with PowerPoint presentation and patient guidebook. To enhance the learning environment the use of posters, models and videos may be added. At the conclusion of this workshop, patients will be able to understand the types of stress reactions, differentiate between acute and chronic stress, and recognize the impact that chronic stress has on their health. They will also be able to apply different coping mechanisms, such  as reframing negative self-talk. Patients will have the opportunity to practice a variety of stress management techniques, such as deep abdominal breathing, progressive muscle relaxation, and/or guided imagery.  The purpose of this lesson is to educate patients on the role of stress in their lives and to provide healthy techniques for coping with it.  Learning Barriers/Preferences:  Learning Barriers/Preferences - 01/11/22 1605       Learning Barriers/Preferences   Learning Barriers Sight;Exercise Concerns   pt has occasional dizziness with position changes   Learning Preferences Written Material;Audio;Computer/Internet;Group Instruction;Individual Instruction;Pictoral;Skilled Demonstration;Verbal Instruction;Video             Education Topics:  Knowledge Questionnaire Score:  Knowledge Questionnaire Score - 01/11/22 1557       Knowledge Questionnaire Score   Pre Score 15/24             Core Components/Risk Factors/Patient Goals at Admission:  Personal Goals and Risk Factors at Admission - 01/11/22 1607       Core Components/Risk Factors/Patient Goals on Admission   Heart Failure Yes    Intervention Provide a combined exercise and nutrition program that is supplemented with education, support and counseling about heart failure. Directed toward relieving symptoms such as shortness of breath, decreased exercise tolerance, and extremity edema.    Expected Outcomes Long term: Adoption of self-care skills and reduction of barriers for early signs and symptoms recognition and intervention leading to self-care maintenance.;Short term: Daily weights obtained and reported for increase. Utilizing diuretic protocols set by physician.;Short term: Attendance in program 2-3 days a week with increased exercise capacity. Reported lower sodium intake. Reported  increased fruit and vegetable intake. Reports medication compliance.;Improve functional capacity of life    Hypertension Yes     Intervention Provide education on lifestyle modifcations including regular physical activity/exercise, weight management, moderate sodium restriction and increased consumption of fresh fruit, vegetables, and low fat dairy, alcohol moderation, and smoking cessation.;Monitor prescription use compliance.    Expected Outcomes Short Term: Continued assessment and intervention until BP is < 140/69m HG in hypertensive participants. < 130/869mHG in hypertensive participants with diabetes, heart failure or chronic kidney disease.;Long Term: Maintenance of blood pressure at goal levels.    Lipids Yes    Intervention Provide education and support for participant on nutrition & aerobic/resistive exercise along with prescribed medications to achieve LDL <7056mHDL >105m70m  Expected Outcomes Long Term: Cholesterol controlled with medications as prescribed, with individualized exercise RX and with personalized nutrition plan. Value goals: LDL < 70mg72mL > 40 mg.;Short Term: Participant states understanding of desired cholesterol values and is compliant with medications prescribed. Participant is following exercise prescription and nutrition guidelines.    Stress Yes    Intervention Offer individual and/or small group education and counseling on adjustment to heart disease, stress management and health-related lifestyle change. Teach and support self-help strategies.;Refer participants experiencing significant psychosocial distress to appropriate mental health specialists for further evaluation and treatment. When possible, include family members and significant others in education/counseling sessions.    Expected Outcomes Short Term: Participant demonstrates changes in health-related behavior, relaxation and other stress management skills, ability to obtain effective social support, and compliance with psychotropic medications if prescribed.;Long Term: Emotional wellbeing is indicated by absence of clinically  significant psychosocial distress or social isolation.    Personal Goal Other Yes    Personal Goal short term: upper body strength. long term: decrease gut size, tone core.    Intervention will continue to monitor pt and progress workloads as tolerated without s/s.    Expected Outcomes pt will achieve their goals.             Core Components/Risk Factors/Patient Goals Review:   Goals and Risk Factor Review     Row Name 01/17/22 1319 01/23/22 1708 02/20/22 1733         Core Components/Risk Factors/Patient Goals Review   Personal Goals Review Hypertension;Lipids;Stress Hypertension;Lipids;Stress Hypertension;Lipids;Stress     Review Lynn Jeani Hawkingted intensive cardiac rehab on 01/15/22 and did well with exercise. Vtial signs stable Lynn Jeani Hawkinginues to  well with exercise at intensive cardiac rehab. Vtial signs remain stable Lynn Jeani Hawkinginues to  well with exercise at intensive cardiac rehab. Vtial signs remain stable. Lynn Jeani Hawkingrted a rash and itching from the metoprolol the heart failure clinic was notifed.     Expected Outcomes Lynn Jeani Hawking continue to participate in phase 2 cardiac rehab for exercise, nutrition and lifestyle modifications Lynn Jeani Hawking continue to participate in phase 2 cardiac rehab for exercise, nutrition and lifestyle modifications Lynn Jeani Hawking continue to participate in phase 2 cardiac rehab for exercise, nutrition and lifestyle modifications              Core Components/Risk Factors/Patient Goals at Discharge (Final Review):   Goals and Risk Factor Review - 02/20/22 1733       Core Components/Risk Factors/Patient Goals Review   Personal Goals Review Hypertension;Lipids;Stress    Review Lynn Jeani Hawkinginues to  well with exercise at intensive cardiac rehab. Vtial signs remain stable. Lynn Jeani Hawkingrted a rash and itching from the metoprolol the heart failure clinic was notifed.    Expected Outcomes  Jeani Hawking will continue to participate in phase 2 cardiac rehab for exercise, nutrition and  lifestyle modifications             ITP Comments:  ITP Comments     Row Name 01/11/22 1629 01/16/22 0748 01/23/22 1706 02/20/22 1732     ITP Comments Medical Director - Dr. Fransico Him 30 Day ITP Review. Addeline started cardiac rehab on 01/15/22 and did well with exercise 30 Day ITP Review. Verne is off to a good start to exercise at intensive cardiac rehab. 30 Day ITP Review. Ian has good participaiton and attendance at intensive cardiac rehab.             Comments: See ITP comments.Harrell Gave RN BSN

## 2022-02-21 ENCOUNTER — Encounter (HOSPITAL_COMMUNITY)
Admission: RE | Admit: 2022-02-21 | Discharge: 2022-02-21 | Disposition: A | Discharge status: Home / Self Care | Payer: Medicaid Other | Source: Ambulatory Visit | Attending: Internal Medicine | Admitting: Internal Medicine

## 2022-02-21 DIAGNOSIS — I5042 Chronic combined systolic (congestive) and diastolic (congestive) heart failure: Secondary | ICD-10-CM

## 2022-02-21 DIAGNOSIS — I110 Hypertensive heart disease with heart failure: Secondary | ICD-10-CM | POA: Diagnosis not present

## 2022-02-22 DIAGNOSIS — R2231 Localized swelling, mass and lump, right upper limb: Secondary | ICD-10-CM | POA: Insufficient documentation

## 2022-02-23 ENCOUNTER — Other Ambulatory Visit (HOSPITAL_COMMUNITY): Payer: Self-pay | Admitting: Physician Assistant

## 2022-02-23 ENCOUNTER — Encounter (HOSPITAL_COMMUNITY): Payer: Medicaid Other

## 2022-02-23 ENCOUNTER — Other Ambulatory Visit (HOSPITAL_COMMUNITY): Payer: Self-pay | Admitting: Family Medicine

## 2022-02-26 ENCOUNTER — Encounter (HOSPITAL_COMMUNITY)
Admission: RE | Admit: 2022-02-26 | Discharge: 2022-02-26 | Disposition: A | Discharge status: Home / Self Care | Payer: Medicaid Other | Source: Ambulatory Visit | Attending: Internal Medicine | Admitting: Internal Medicine

## 2022-02-26 DIAGNOSIS — I5042 Chronic combined systolic (congestive) and diastolic (congestive) heart failure: Secondary | ICD-10-CM

## 2022-02-26 DIAGNOSIS — I110 Hypertensive heart disease with heart failure: Secondary | ICD-10-CM | POA: Diagnosis not present

## 2022-02-26 NOTE — Progress Notes (Signed)
Lynn Estrada reported feeling fatigued from overdoing it this weekend. Orthostatic Blood pressures checked and within normal limits. Lynn Estrada decided to leave early after completing exercise on the nustep to go home and get some rest. Lynn Estrada plans to return to exercise on Wednesday.Harrell Gave RN BSN

## 2022-02-28 ENCOUNTER — Telehealth (HOSPITAL_COMMUNITY): Payer: Self-pay | Admitting: *Deleted

## 2022-02-28 ENCOUNTER — Encounter (HOSPITAL_COMMUNITY)
Admission: RE | Admit: 2022-02-28 | Discharge: 2022-02-28 | Disposition: A | Discharge status: Home / Self Care | Payer: Medicaid Other | Source: Ambulatory Visit | Attending: Internal Medicine | Admitting: Internal Medicine

## 2022-02-28 DIAGNOSIS — I110 Hypertensive heart disease with heart failure: Secondary | ICD-10-CM | POA: Diagnosis not present

## 2022-02-28 DIAGNOSIS — I5042 Chronic combined systolic (congestive) and diastolic (congestive) heart failure: Secondary | ICD-10-CM

## 2022-02-28 NOTE — Telephone Encounter (Signed)
Itching appears to be adverse drug reaction. Could try changing to bisoprolol but it is likely she will have the same reaction as she has failed both metoprolol and carvedilol. Could also consider starting Corlanor as she has failed multiple beta blockers. Noted to be in NSR on last EKG 02/01/22. Last documented HR was 95.

## 2022-02-28 NOTE — Telephone Encounter (Signed)
Message forwarded to Dr Haroldine Laws for further recommendations

## 2022-02-28 NOTE — Telephone Encounter (Signed)
Received message from [place] with cardiac rehab, pt is itching since starting on Carvedilol on 02/19/22, she was originally ordered Metoprolol and had itching/rash so was changed to Carvedilol.  Will send to pharmacy team for input

## 2022-02-28 NOTE — Progress Notes (Signed)
Lynn Estrada is now taking 3.125 of coreg twice a day. Lynn Estrada says she is starting to feel itchy again all over. Lynn Estrada denies having a current rash. Lynn Estrada says she feels tired but has no other symptoms. Will notify the heart failure clinic. Vital signs today are as follows. Resting BP 100/62 heart rate 78. Blood pressure 108/72 heart rate 101 on the nustep.Barnet Pall, RN,BSN 02/28/2022 9:57 AM

## 2022-03-01 NOTE — Telephone Encounter (Signed)
Pt aware, agreeable, and verbalized understanding, allergies updated in chart

## 2022-03-02 ENCOUNTER — Encounter (HOSPITAL_COMMUNITY)
Admission: RE | Admit: 2022-03-02 | Discharge: 2022-03-02 | Disposition: A | Discharge status: Home / Self Care | Payer: Medicaid Other | Source: Ambulatory Visit | Attending: Internal Medicine | Admitting: Internal Medicine

## 2022-03-02 DIAGNOSIS — I5042 Chronic combined systolic (congestive) and diastolic (congestive) heart failure: Secondary | ICD-10-CM | POA: Insufficient documentation

## 2022-03-02 DIAGNOSIS — Z5189 Encounter for other specified aftercare: Secondary | ICD-10-CM | POA: Diagnosis not present

## 2022-03-07 ENCOUNTER — Encounter (HOSPITAL_COMMUNITY)
Admission: RE | Admit: 2022-03-07 | Discharge: 2022-03-07 | Disposition: A | Discharge status: Home / Self Care | Payer: Medicaid Other | Source: Ambulatory Visit | Attending: Internal Medicine | Admitting: Internal Medicine

## 2022-03-07 DIAGNOSIS — I5042 Chronic combined systolic (congestive) and diastolic (congestive) heart failure: Secondary | ICD-10-CM

## 2022-03-08 ENCOUNTER — Telehealth (HOSPITAL_COMMUNITY): Payer: Self-pay

## 2022-03-08 NOTE — Telephone Encounter (Signed)
Received a medical clearance form from [facility] , requesting Cardiology clearance for the following procedure: Excision of RT hand mass. Medical clearance form was signed by Dr. Glori Bickers and successfully faxed to [phone] on Thursday, September 7,. Form will be scanned into patients chart.

## 2022-03-09 ENCOUNTER — Encounter (HOSPITAL_COMMUNITY)
Admission: RE | Admit: 2022-03-09 | Discharge: 2022-03-09 | Disposition: A | Discharge status: Home / Self Care | Payer: Medicaid Other | Source: Ambulatory Visit | Attending: Internal Medicine | Admitting: Internal Medicine

## 2022-03-09 DIAGNOSIS — I5042 Chronic combined systolic (congestive) and diastolic (congestive) heart failure: Secondary | ICD-10-CM

## 2022-03-12 ENCOUNTER — Encounter (HOSPITAL_COMMUNITY)
Admission: RE | Admit: 2022-03-12 | Discharge: 2022-03-12 | Disposition: A | Discharge status: Home / Self Care | Payer: Medicaid Other | Source: Ambulatory Visit | Attending: Internal Medicine | Admitting: Internal Medicine

## 2022-03-12 VITALS — Ht 73.0 in | Wt 211.2 lb

## 2022-03-12 DIAGNOSIS — I5042 Chronic combined systolic (congestive) and diastolic (congestive) heart failure: Secondary | ICD-10-CM

## 2022-03-14 ENCOUNTER — Encounter (HOSPITAL_COMMUNITY)
Admission: RE | Admit: 2022-03-14 | Discharge: 2022-03-14 | Disposition: A | Discharge status: Home / Self Care | Payer: Medicaid Other | Source: Ambulatory Visit | Attending: Internal Medicine | Admitting: Internal Medicine

## 2022-03-14 DIAGNOSIS — I5042 Chronic combined systolic (congestive) and diastolic (congestive) heart failure: Secondary | ICD-10-CM

## 2022-03-16 ENCOUNTER — Encounter (HOSPITAL_COMMUNITY)
Admission: RE | Admit: 2022-03-16 | Discharge: 2022-03-16 | Disposition: A | Discharge status: Home / Self Care | Payer: Medicaid Other | Source: Ambulatory Visit | Attending: Internal Medicine | Admitting: Internal Medicine

## 2022-03-16 DIAGNOSIS — I5042 Chronic combined systolic (congestive) and diastolic (congestive) heart failure: Secondary | ICD-10-CM | POA: Diagnosis not present

## 2022-03-16 NOTE — Progress Notes (Signed)
Discharge Progress Report  Patient Details  Name: Lynn Estrada MRN: [id number] Date of Birth: [birth year] Referring Provider:   Flowsheet Row INTENSIVE CARDIAC REHAB ORIENT from 01/11/2022 in [place]  Referring Provider Glori Bickers, MD        Number of Visits: 25  Reason for Discharge:  Patient reached a stable level of exercise. Patient independent in their exercise. Patient has met program and personal goals.  Smoking History:  Social History   Tobacco Use  Smoking Status Former   Packs/day: 0.75   Years: 5.00   Total pack years: 3.75   Types: Cigarettes   Quit date: 01/10/1997   Years since quitting: 25.2  Smokeless Tobacco Never    Diagnosis:  Chronic combined systolic and diastolic heart failure ([place])  ADL UCSD:   Initial Exercise Prescription:  Initial Exercise Prescription - 01/11/22 1600       Date of Initial Exercise RX and Referring Provider   Date 01/11/22    Referring Provider Glori Bickers, MD    Expected Discharge Date 03/09/22      NuStep   Level 2    SPM 75    Minutes 15    METs 2.5      Arm Ergometer   Level 1    RPM 60    Minutes 15    METs 2      Prescription Details   Frequency (times per week) 3    Duration Progress to 30 minutes of continuous aerobic without signs/symptoms of physical distress      Intensity   THRR 40-80% of Max Heartrate 64-129    Ratings of Perceived Exertion 11-13    Perceived Dyspnea 0-4      Progression   Progression Continue progressive overload as per policy without signs/symptoms or physical distress.      Resistance Training   Training Prescription Yes    Weight 2    Reps 10-15             Discharge Exercise Prescription (Final Exercise Prescription Changes):  Exercise Prescription Changes - 03/16/22 1500       Response to Exercise   Blood Pressure (Admit) 102/60    Blood Pressure (Exercise) 108/64    Blood Pressure (Exit) 100/60     Heart Rate (Admit) 92 bpm    Heart Rate (Exercise) 102 bpm    Heart Rate (Exit) 90 bpm    Rating of Perceived Exertion (Exercise) 11    Symptoms None    Comments Pt graduated from the CRP2 program today    Duration Continue with 30 min of aerobic exercise without signs/symptoms of physical distress.    Intensity THRR unchanged      Progression   Progression Continue to progress workloads to maintain intensity without signs/symptoms of physical distress.    Average METs 2      Resistance Training   Training Prescription Yes    Weight 2 lbs    Reps 10-15    Time 10 Minutes      Interval Training   Interval Training No      NuStep   Level 3    Minutes 15    METs 2      Arm Ergometer   Level 3    Watts 9    Minutes 15    METs 1      Home Exercise Plan   Plans to continue exercise at [facility] [employer] (comment)    Frequency Add  2 additional days to program exercise sessions.    Initial Home Exercises Provided 02/19/22             Functional Capacity:  6 Minute Walk     Row Name 01/11/22 1555 03/12/22 1432       6 Minute Walk   Phase Initial Discharge    Distance 1479 feet 1614 feet    Distance % Change -- 9.13 %    Distance Feet Change -- 135 ft    Walk Time 6 minutes 6 minutes    # of Rest Breaks 0 0    MPH 2.8 3.06    METS 3.81 4.07    RPE 11 11    Perceived Dyspnea  0 0    VO2 Peak 13.32 14.25    Symptoms Yes (comment) No    Comments left knee pain, 3/10, resolved with rest. --    Resting HR 98 bpm 81 bpm    Resting BP 98/60 116/58    Resting Oxygen Saturation  98 % 98 %    Exercise Oxygen Saturation  during 6 min walk 100 % 98 %    Max Ex. HR 114 bpm 106 bpm    Max Ex. BP 104/62 118/60    2 Minute Post BP 98/58 102/56             Psychological, QOL, Others - Outcomes: PHQ 2/9:    03/16/2022    9:32 AM 01/11/2022    4:33 PM 10/29/2017    3:14 PM 09/11/2017    3:42 PM 08/07/2017    3:02 PM  Depression screen PHQ 2/9  Decreased Interest 0  0 0 0 0  Down, Depressed, Hopeless 0 1 0 0 0  PHQ - 2 Score 0 1 0 0 0  Altered sleeping  0     Tired, decreased energy  1     Change in appetite  0     Feeling bad or failure about yourself   0     Trouble concentrating  0     Moving slowly or fidgety/restless  0     Suicidal thoughts  0     PHQ-9 Score  2     Difficult doing work/chores  Somewhat difficult       Quality of Life:  Quality of Life - 03/02/22 1615       Quality of Life   Select Quality of Life      Quality of Life Scores   Health/Function Pre 21.2 %    Health/Function Post 22.4 %    Health/Function % Change 5.66 %    Socioeconomic Pre 19.69 %    Socioeconomic Post 19.63 %    Socioeconomic % Change  -0.3 %    Psych/Spiritual Pre 24.86 %    Psych/Spiritual Post 26.57 %    Psych/Spiritual % Change 6.88 %    Family Pre 27.6 %    Family Post 30 %    Family % Change 8.7 %    GLOBAL Pre 22.5 %    GLOBAL Post 23.69 %    GLOBAL % Change 5.29 %             Personal Goals: Goals established at orientation with interventions provided to work toward goal.  Personal Goals and Risk Factors at Admission - 01/11/22 1607       Core Components/Risk Factors/Patient Goals on Admission   Heart Failure Yes    Intervention Provide a combined exercise and nutrition  program that is supplemented with education, support and counseling about heart failure. Directed toward relieving symptoms such as shortness of breath, decreased exercise tolerance, and extremity edema.    Expected Outcomes Long term: Adoption of self-care skills and reduction of barriers for early signs and symptoms recognition and intervention leading to self-care maintenance.;Short term: Daily weights obtained and reported for increase. Utilizing diuretic protocols set by physician.;Short term: Attendance in program 2-3 days a week with increased exercise capacity. Reported lower sodium intake. Reported increased fruit and vegetable intake. Reports medication  compliance.;Improve functional capacity of life    Hypertension Yes    Intervention Provide education on lifestyle modifcations including regular physical activity/exercise, weight management, moderate sodium restriction and increased consumption of fresh fruit, vegetables, and low fat dairy, alcohol moderation, and smoking cessation.;Monitor prescription use compliance.    Expected Outcomes Short Term: Continued assessment and intervention until BP is < 140/42m HG in hypertensive participants. < 130/81mHG in hypertensive participants with diabetes, heart failure or chronic kidney disease.;Long Term: Maintenance of blood pressure at goal levels.    Lipids Yes    Intervention Provide education and support for participant on nutrition & aerobic/resistive exercise along with prescribed medications to achieve LDL <7027mHDL >1m29m  Expected Outcomes Long Term: Cholesterol controlled with medications as prescribed, with individualized exercise RX and with personalized nutrition plan. Value goals: LDL < 70mg66mL > 40 mg.;Short Term: Participant states understanding of desired cholesterol values and is compliant with medications prescribed. Participant is following exercise prescription and nutrition guidelines.    Stress Yes    Intervention Offer individual and/or small group education and counseling on adjustment to heart disease, stress management and health-related lifestyle change. Teach and support self-help strategies.;Refer participants experiencing significant psychosocial distress to appropriate mental health specialists for further evaluation and treatment. When possible, include family members and significant others in education/counseling sessions.    Expected Outcomes Short Term: Participant demonstrates changes in health-related behavior, relaxation and other stress management skills, ability to obtain effective social support, and compliance with psychotropic medications if prescribed.;Long  Term: Emotional wellbeing is indicated by absence of clinically significant psychosocial distress or social isolation.    Personal Goal Other Yes    Personal Goal short term: upper body strength. long term: decrease gut size, tone core.    Intervention will continue to monitor pt and progress workloads as tolerated without s/s.    Expected Outcomes pt will achieve their goals.              Personal Goals Discharge:  Goals and Risk Factor Review     Row Name 01/17/22 1319 01/23/22 1708 02/20/22 1733 03/23/22 1100       Core Components/Risk Factors/Patient Goals Review   Personal Goals Review Hypertension;Lipids;Stress Hypertension;Lipids;Stress Hypertension;Lipids;Stress Hypertension;Lipids;Stress    Review Lynn Jeani Hawkingted intensive cardiac rehab on 01/15/22 and did well with exercise. Vtial signs stable Lynn Jeani Hawkinginues to  well with exercise at intensive cardiac rehab. Vtial signs remain stable Lynn Jeani Hawkinginues to  well with exercise at intensive cardiac rehab. Vtial signs remain stable. Lynn Jeani Hawkingrted a rash and itching from the metoprolol the heart failure clinic was notifed. Lynn Jeani Hawkingwell with exercise at intensive cardiac rehab. Vtial signs remained stable. Lynn Jeani Hawkingleted cardiac rehab on 03/16/22    Expected Outcomes Lynn Estrada continue to participate in phase 2 cardiac rehab for exercise, nutrition and lifestyle modifications Lynn Estrada continue to participate in phase 2 cardiac rehab for exercise, nutrition and lifestyle modifications Lynn Estrada  continue to participate in phase 2 cardiac rehab for exercise, nutrition and lifestyle modifications Jeani Estrada will continue to  exercise, follow  nutrition and lifestyle modifications upon completion of intensive cardiac rehab.             Exercise Goals and Review:  Exercise Goals     Row Name 01/11/22 1602             Exercise Goals   Increase Physical Activity Yes       Intervention Develop an individualized exercise prescription for  aerobic and resistive training based on initial evaluation findings, risk stratification, comorbidities and participant's personal goals.;Provide advice, education, support and counseling about physical activity/exercise needs.       Expected Outcomes Short Term: Attend rehab on a regular basis to increase amount of physical activity.;Long Term: Add in home exercise to make exercise part of routine and to increase amount of physical activity.;Long Term: Exercising regularly at least 3-5 days a week.       Increase Strength and Stamina Yes       Intervention Provide advice, education, support and counseling about physical activity/exercise needs.;Develop an individualized exercise prescription for aerobic and resistive training based on initial evaluation findings, risk stratification, comorbidities and participant's personal goals.       Expected Outcomes Short Term: Increase workloads from initial exercise prescription for resistance, speed, and METs.;Short Term: Perform resistance training exercises routinely during rehab and add in resistance training at home;Long Term: Improve cardiorespiratory fitness, muscular endurance and strength as measured by increased METs and functional capacity (6MWT)       Able to understand and use rate of perceived exertion (RPE) scale Yes       Intervention Provide education and explanation on how to use RPE scale       Expected Outcomes Short Term: Able to use RPE daily in rehab to express subjective intensity level;Long Term:  Able to use RPE to guide intensity level when exercising independently       Knowledge and understanding of Target Heart Rate Range (THRR) Yes       Intervention Provide education and explanation of THRR including how the numbers were predicted and where they are located for reference       Expected Outcomes Short Term: Able to state/look up THRR;Short Term: Able to use daily as guideline for intensity in rehab;Long Term: Able to use THRR to  govern intensity when exercising independently       Understanding of Exercise Prescription Yes       Intervention Provide education, explanation, and written materials on patient's individual exercise prescription       Expected Outcomes Short Term: Able to explain program exercise prescription;Long Term: Able to explain home exercise prescription to exercise independently                Exercise Goals Re-Evaluation:  Exercise Goals Re-Evaluation     Row Name 01/15/22 1411 02/19/22 1015 02/20/22 0748 03/16/22 1558       Exercise Goal Re-Evaluation   Exercise Goals Review Increase Physical Activity;Increase Strength and Stamina;Able to understand and use rate of perceived exertion (RPE) scale;Knowledge and understanding of Target Heart Rate Range (THRR);Understanding of Exercise Prescription Increase Physical Activity;Increase Strength and Stamina;Able to understand and use rate of perceived exertion (RPE) scale;Knowledge and understanding of Target Heart Rate Range (THRR);Understanding of Exercise Prescription -- Increase Physical Activity;Increase Strength and Stamina;Able to understand and use rate of perceived exertion (RPE) scale;Knowledge and understanding of Target Heart  Rate Range (THRR);Understanding of Exercise Prescription    Comments Pt's first day in the CRP2 prgram. Pt understands the exercise Rx, THRR, and RPE scale. Reviewed METs, goals and home exercise Rx. Pt voices improvement in strength and stamina. MET progression is slow. Pt is exercising 2 x /week for 30 minutes at the [facility] with her spouse. Pt is at goal for 150 minutes/week for exercise. -- Pt graduatedf from the [place] program today. Pt is exercising 2x/week at [facility], walking and swimming, in addtion to 3 x/week in the CRP2 program. Pt had peak METs of 2.4. Pt plans tpo continue to exercise at the [facility] with her spouse.    Expected Outcomes Will continue to monitor and will progress exericse workloads as tolerated. Will  continue to monitor and will progress exericse workloads as tolerated. -- Pt will contiunue to exercise on her on at home and the [facility].             Nutrition & Weight - Outcomes:  Pre Biometrics - 01/11/22 1603       Pre Biometrics   Height '6\' 1"'  (1.854 m)    Weight 94.9 kg    Waist Circumference 40.75 inches    Hip Circumference 46.5 inches    Waist to Hip Ratio 0.88 %    BMI (Calculated) 27.61    Triceps Skinfold 22 mm    % Body Fat 37.8 %    Grip Strength 31 kg    Flexibility 13.75 in    Single Leg Stand 5.6 seconds             Post Biometrics - 03/12/22 1434        Post  Biometrics   Height '6\' 1"'  (1.854 m)    Weight 95.8 kg    Waist Circumference 39 inches    Hip Circumference 44 inches    Waist to Hip Ratio 0.89 %    BMI (Calculated) 27.87    Triceps Skinfold 21 mm    % Body Fat 37 %    Grip Strength 32 kg    Flexibility 14.5 in    Single Leg Stand 15.93 seconds             Nutrition:  Nutrition Therapy & Goals - 03/16/22 1039       Nutrition Therapy   Diet Heart Healthy Diet    Drug/Food Interactions Statins/Certain Fruits      Personal Nutrition Goals   Nutrition Goal Patient to choose a daily variety of fruits, vegetables, whole grains, lean protein/plant protein, nonfat dairy as part of heart healthy lifestyle.    Personal Goal #2 Patient to limit to <1554m of sodium per day.    Personal Goal #3 Patient to identify and limit daily intake of saturated fat, trans fats, sodium, and refined carbohydrates.    Comments Patient graduates today. Goals in progress. She attended the [employer] education series. Per diet recall, she has increased high fiber foods, reduced saturated fat, and reduced sodium. She continues to report good support from family toward lifestyle changes.      Intervention Plan   Intervention Prescribe, educate and counsel regarding individualized specific dietary modifications aiming towards targeted core components such as  weight, hypertension, lipid management, diabetes, heart failure and other comorbidities.;Nutrition handout(s) given to patient.    Expected Outcomes Short Term Goal: Understand basic principles of dietary content, such as calories, fat, sodium, cholesterol and nutrients.;Long Term Goal: Adherence to prescribed nutrition plan.  Nutrition Discharge:  Nutrition Assessments - 03/20/22 0922       Rate Your Plate Scores   Post Score 72             Education Questionnaire Score:  Knowledge Questionnaire Score - 03/02/22 1617       Knowledge Questionnaire Score   Post Score 24/24             Goals reviewed with patient; copy given to patient.Pt graduates from  [facility] cardiac rehab program today with completion of   25 exercise and education sessions. Pt maintained good attendance and progressed nicely during their participation in rehab as evidenced by increased MET level.   Medication list reconciled. Repeat  PHQ score- 0 .  Pt has made significant lifestyle changes and should be commended for their success. Jeani Estrada achieved their goals during cardiac rehab.   Pt plans to continue exercise at the [facility] and walking in the neighborhood. Jeani Estrada increased her distance on her post exercise walk test by 135 feet. We are proud of Lynn's progress! Harrell Gave RN BSN

## 2022-03-18 ENCOUNTER — Other Ambulatory Visit (HOSPITAL_COMMUNITY): Payer: Self-pay | Admitting: Family Medicine

## 2022-03-19 ENCOUNTER — Other Ambulatory Visit (HOSPITAL_COMMUNITY): Payer: Self-pay

## 2022-03-19 MED ORDER — SPIRONOLACTONE 25 MG PO TABS: 25.0000 mg | ORAL_TABLET | Freq: Every day | ORAL | 5 refills | Status: DC

## 2022-03-19 NOTE — Telephone Encounter (Signed)
Meds ordered this encounter  Medications   spironolactone (ALDACTONE) 25 MG tablet    Sig: Take 1 tablet (25 mg total) by mouth daily.    Dispense:  30 tablet    Refill:  5

## 2022-03-22 ENCOUNTER — Other Ambulatory Visit: Payer: Self-pay

## 2022-03-22 ENCOUNTER — Telehealth (HOSPITAL_COMMUNITY): Payer: Self-pay

## 2022-03-22 DIAGNOSIS — I252 Old myocardial infarction: Secondary | ICD-10-CM

## 2022-03-22 DIAGNOSIS — E7800 Pure hypercholesterolemia, unspecified: Secondary | ICD-10-CM

## 2022-03-22 DIAGNOSIS — I509 Heart failure, unspecified: Secondary | ICD-10-CM

## 2022-03-22 DIAGNOSIS — I10 Essential (primary) hypertension: Secondary | ICD-10-CM

## 2022-03-22 NOTE — Telephone Encounter (Signed)
Surgical clearance form faxed on 03/22/22 at 1.20pm

## 2022-04-03 ENCOUNTER — Ambulatory Visit
Admission: RE | Admit: 2022-04-03 | Discharge: 2022-04-03 | Disposition: A | Discharge status: Home / Self Care | Payer: Disability Insurance | Source: Ambulatory Visit | Attending: Internal Medicine | Admitting: Internal Medicine

## 2022-04-03 DIAGNOSIS — I5042 Chronic combined systolic (congestive) and diastolic (congestive) heart failure: Secondary | ICD-10-CM

## 2022-04-03 DIAGNOSIS — I110 Hypertensive heart disease with heart failure: Secondary | ICD-10-CM | POA: Insufficient documentation

## 2022-04-03 DIAGNOSIS — F17200 Nicotine dependence, unspecified, uncomplicated: Secondary | ICD-10-CM | POA: Diagnosis not present

## 2022-04-03 DIAGNOSIS — E7800 Pure hypercholesterolemia, unspecified: Secondary | ICD-10-CM | POA: Insufficient documentation

## 2022-04-03 DIAGNOSIS — I252 Old myocardial infarction: Secondary | ICD-10-CM | POA: Diagnosis not present

## 2022-04-03 DIAGNOSIS — I10 Essential (primary) hypertension: Secondary | ICD-10-CM

## 2022-04-03 DIAGNOSIS — I447 Left bundle-branch block, unspecified: Secondary | ICD-10-CM | POA: Diagnosis not present

## 2022-04-03 DIAGNOSIS — I509 Heart failure, unspecified: Secondary | ICD-10-CM | POA: Diagnosis not present

## 2022-04-03 DIAGNOSIS — I080 Rheumatic disorders of both mitral and aortic valves: Secondary | ICD-10-CM | POA: Insufficient documentation

## 2022-04-03 LAB — ECHOCARDIOGRAM COMPLETE
AR max vel: 1.71 cm2
AV Area VTI: 1.43 cm2
AV Area mean vel: 1.54 cm2
AV Mean grad: 3 mmHg
AV Peak grad: 6.2 mmHg
Ao pk vel: 1.24 m/s
Area-P 1/2: 5.88 cm2
Calc EF: 18 %
P 1/2 time: 1486 msec
S' Lateral: 6.4 cm
Single Plane A2C EF: 21.3 %
Single Plane A4C EF: 16.8 %

## 2022-04-08 ENCOUNTER — Other Ambulatory Visit (HOSPITAL_COMMUNITY): Payer: Self-pay | Admitting: Family Medicine

## 2022-04-10 ENCOUNTER — Telehealth (HOSPITAL_COMMUNITY): Payer: Self-pay | Admitting: *Deleted

## 2022-04-10 NOTE — Telephone Encounter (Signed)
Pt requests a phone call about disability paperwork sent on 9/19.   Routed to [employer] and [employer]

## 2022-04-12 NOTE — Telephone Encounter (Signed)
Disability forms completed, signed by Dr Haroldine Laws and faxed into [place]. Pt is aware this was done and she p/u copy at front desk

## 2022-04-12 NOTE — Telephone Encounter (Signed)
Waiting on Dr. Serena Croissant

## 2022-05-03 ENCOUNTER — Other Ambulatory Visit (HOSPITAL_COMMUNITY): Payer: Self-pay | Admitting: Family Medicine

## 2022-05-14 ENCOUNTER — Emergency Department (HOSPITAL_COMMUNITY): Payer: Medicaid Other

## 2022-05-14 ENCOUNTER — Emergency Department (HOSPITAL_COMMUNITY)
Admission: EM | Admit: 2022-05-14 | Discharge: 2022-05-14 | Disposition: A | Discharge status: Home / Self Care | Payer: Medicaid Other | Attending: Emergency Medicine | Admitting: Emergency Medicine

## 2022-05-14 DIAGNOSIS — Z1152 Encounter for screening for COVID-19: Secondary | ICD-10-CM | POA: Diagnosis not present

## 2022-05-14 DIAGNOSIS — I110 Hypertensive heart disease with heart failure: Secondary | ICD-10-CM | POA: Diagnosis not present

## 2022-05-14 DIAGNOSIS — Z79899 Other long term (current) drug therapy: Secondary | ICD-10-CM | POA: Diagnosis not present

## 2022-05-14 DIAGNOSIS — R0981 Nasal congestion: Secondary | ICD-10-CM

## 2022-05-14 DIAGNOSIS — Z91040 Latex allergy status: Secondary | ICD-10-CM | POA: Diagnosis not present

## 2022-05-14 DIAGNOSIS — J069 Acute upper respiratory infection, unspecified: Secondary | ICD-10-CM | POA: Diagnosis not present

## 2022-05-14 DIAGNOSIS — R0602 Shortness of breath: Secondary | ICD-10-CM | POA: Diagnosis present

## 2022-05-14 DIAGNOSIS — Z7982 Long term (current) use of aspirin: Secondary | ICD-10-CM | POA: Diagnosis not present

## 2022-05-14 DIAGNOSIS — I9589 Other hypotension: Secondary | ICD-10-CM | POA: Diagnosis not present

## 2022-05-14 DIAGNOSIS — I5022 Chronic systolic (congestive) heart failure: Secondary | ICD-10-CM | POA: Diagnosis not present

## 2022-05-14 DIAGNOSIS — E861 Hypovolemia: Secondary | ICD-10-CM

## 2022-05-14 LAB — RESP PANEL BY RT-PCR (FLU A&B, COVID) ARPGX2
Influenza A by PCR: NEGATIVE
Influenza B by PCR: NEGATIVE
SARS Coronavirus 2 by RT PCR: NEGATIVE

## 2022-05-14 LAB — GROUP A STREP BY PCR: Group A Strep by PCR: NOT DETECTED

## 2022-05-14 LAB — TROPONIN I (HIGH SENSITIVITY)
Troponin I (High Sensitivity): 34 ng/L — ABNORMAL HIGH (ref <18)
Troponin I (High Sensitivity): 32 ng/L — ABNORMAL HIGH (ref <18)

## 2022-05-14 LAB — CBC WITH DIFFERENTIAL/PLATELET
Abs Immature Granulocytes: 0.02 10*3/uL (ref 0.00–0.07)
Basophils Absolute: 0 10*3/uL (ref 0.0–0.1)
Basophils Relative: 1 %
Eosinophils Absolute: 0.1 10*3/uL (ref 0.0–0.5)
Eosinophils Relative: 2 %
HCT: 38.6 % (ref 36.0–46.0)
Hemoglobin: 12.2 g/dL (ref 12.0–15.0)
Immature Granulocytes: 0 %
Lymphocytes Relative: 28 %
Lymphs Abs: 1.9 10*3/uL (ref 0.7–4.0)
MCH: 28.6 pg (ref 26.0–34.0)
MCHC: 31.6 g/dL (ref 30.0–36.0)
MCV: 90.4 fL (ref 80.0–100.0)
Monocytes Absolute: 1 10*3/uL (ref 0.1–1.0)
Monocytes Relative: 14 %
Neutro Abs: 3.8 10*3/uL (ref 1.7–7.7)
Neutrophils Relative %: 55 %
Platelets: 267 10*3/uL (ref 150–400)
RBC: 4.27 MIL/uL (ref 3.87–5.11)
RDW: 14.3 % (ref 11.5–15.5)
WBC: 6.8 10*3/uL (ref 4.0–10.5)
nRBC: 0 % (ref 0.0–0.2)

## 2022-05-14 LAB — LIPASE, BLOOD: Lipase: 49 U/L (ref 11–51)

## 2022-05-14 LAB — COMPREHENSIVE METABOLIC PANEL
ALT: 21 U/L (ref 0–44)
AST: 18 U/L (ref 15–41)
Albumin: 3.6 g/dL (ref 3.5–5.0)
Alkaline Phosphatase: 48 U/L (ref 38–126)
Anion gap: 12 (ref 5–15)
BUN: 19 mg/dL (ref 6–20)
CO2: 25 mmol/L (ref 22–32)
Calcium: 8.8 mg/dL — ABNORMAL LOW (ref 8.9–10.3)
Chloride: 103 mmol/L (ref 98–111)
Creatinine, Ser: 1.38 mg/dL — ABNORMAL HIGH (ref 0.44–1.00)
GFR, Estimated: 44 mL/min — ABNORMAL LOW (ref >60)
Glucose, Bld: 122 mg/dL — ABNORMAL HIGH (ref 70–99)
Potassium: 3.9 mmol/L (ref 3.5–5.1)
Sodium: 140 mmol/L (ref 135–145)
Total Bilirubin: 0.5 mg/dL (ref 0.3–1.2)
Total Protein: 7.1 g/dL (ref 6.5–8.1)

## 2022-05-14 LAB — LACTIC ACID, PLASMA
Lactic Acid, Venous: 0.7 mmol/L (ref 0.5–1.9)
Lactic Acid, Venous: 0.7 mmol/L (ref 0.5–1.9)

## 2022-05-14 LAB — BRAIN NATRIURETIC PEPTIDE: B Natriuretic Peptide: 170.7 pg/mL — ABNORMAL HIGH (ref 0.0–100.0)

## 2022-05-14 MED ORDER — CETIRIZINE HCL 10 MG PO TABS: 10.0000 mg | ORAL_TABLET | Freq: Every day | ORAL | 0 refills | Status: DC

## 2022-05-14 MED ORDER — AMOXICILLIN 500 MG PO CAPS: 500.0000 mg | ORAL_CAPSULE | Freq: Three times a day (TID) | ORAL | 0 refills | Status: DC

## 2022-05-14 MED ORDER — CETIRIZINE HCL 10 MG PO TABS
5.0000 mg | ORAL_TABLET | Freq: Once | ORAL | Status: AC
  Administered 2022-05-14: 5 mg via ORAL
  Filled 2022-05-14: qty 1

## 2022-05-14 NOTE — ED Provider Triage Note (Signed)
Emergency Medicine Provider Triage Evaluation Note  Lynn Estrada , a 61 y.o. female  was evaluated in triage.  Pt complains of SOB, difficulty breathing. UR Sx since Friday seen by PCP neg COVID test. Worsenign SOB since. HX of CHF. No LE swelling. Sore throat, feels tight. No rash, hx of allergic reaction. Overall decreased appetite. Chills at home. Lightheaded, near syncope  Review of Systems  Positive: SOB, cough, sore throat, near syncope Negative: fever  Physical Exam  BP (!) 88/58 (BP Location: Right Arm)   Pulse 89   Temp 98.4 F (36.9 C) (Oral)   Resp 18   Ht '6\' 1"'$  (1.854 m)   Wt 92.1 kg   SpO2 97%   BMI 26.78 kg/m  Gen:   Awake,  Resp:  Normal effort, decreased air movement Mouth:  Clear PO, no pooling of secretion MSK:   Moves extremities without difficulty, no le edema Other:    Medical Decision Making  Medically screening exam initiated at 9:16 AM.  Appropriate orders placed.  Lynn Estrada was informed that the remainder of the evaluation will be completed by another provider, this initial triage assessment does not replace that evaluation, and the importance of remaining in the ED until their evaluation is complete.  SOB  Hypotensive  Nursing aware patient needs room in back   Terrelle Ruffolo A, PA-C 05/14/22 [id number]

## 2022-05-14 NOTE — ED Triage Notes (Signed)
Pt. Stated, Ive had SOB with a sore throat and I feel like Im dehydrated.

## 2022-05-14 NOTE — Discharge Instructions (Addendum)
We suspect that you likely a viral upper respiratory infection. Take the medicine prescribed and sent to your pharmacy for runny nose. If you do not get better in 24 hours, and your symptoms are getting worse, then fill the prescription to cover you for pneumonia.  Return to the emergency room if your symptoms get worse.

## 2022-05-14 NOTE — ED Provider Notes (Signed)
[facility] EMERGENCY DEPARTMENT Provider Note   CSN: [id number] Arrival date & time: 05/14/22  1517     History {Add pertinent medical, surgical, social history, OB history to HPI:1} Chief Complaint  Patient presents with   Sore Throat   Nasal Congestion   Shortness of Breath    Lynn Estrada is a 61 y.o. female.  HPI    61 year old female comes in with chief complaint of sore throat, nasal congestion, shortness of breath.     Home Medications Prior to Admission medications   Medication Sig Start Date End Date Taking? Authorizing Provider  amoxicillin (AMOXIL) 500 MG capsule Take 1 capsule (500 mg total) by mouth 3 (three) times daily. 05/14/22  Yes Varney Biles, MD  cetirizine (ZYRTEC ALLERGY) 10 MG tablet Take 1 tablet (10 mg total) by mouth daily. 05/14/22  Yes Varney Biles, MD  acetaminophen (TYLENOL) 500 MG tablet Take 500-1,000 mg by mouth every 6 (six) hours as needed (for pain.).    [provider]  aspirin 81 MG EC tablet Take 81 mg by mouth daily with lunch.    [provider]  atorvastatin (LIPITOR) 10 MG tablet Take 10 mg by mouth daily. 05/15/21   [provider]  cyclobenzaprine (FLEXERIL) 5 MG tablet Take 5-10 mg by mouth 3 (three) times daily as needed for spasms. 12/09/21   [provider]  digoxin (LANOXIN) 0.125 MG tablet TAKE 1 TABLET BY MOUTH DAILY 02/23/22   Joette Catching, PA-C  empagliflozin (JARDIANCE) 10 MG TABS tablet Take 1 tablet (10 mg total) by mouth daily. 10/02/21   Milford, Maricela Bo, FNP  furosemide (LASIX) 40 MG tablet Take 1 tablet (40 mg total) by mouth daily as needed. 10/25/21   Bensimhon, Shaune Pascal, MD  HYDROcodone-acetaminophen (NORCO) 10-325 MG tablet Take 0.5 tablets by mouth every 6 (six) hours as needed for pain. 11/17/21   [provider]  Multiple Vitamin (MULTIVITAMIN WITH MINERALS) TABS tablet Take 1 tablet by mouth daily with lunch. One A Day for Women  50+    [provider]  sacubitril-valsartan (ENTRESTO) 24-26 MG Take 1 tablet by mouth 2 (two) times daily. 07/31/21   Rafael Bihari, FNP  spironolactone (ALDACTONE) 25 MG tablet Take 1 tablet (25 mg total) by mouth daily. 03/19/22   Bensimhon, Shaune Pascal, MD      Allergies    Bactrim [sulfamethoxazole-trimethoprim], Carvedilol, Latex, and Metoprolol    Review of Systems   Review of Systems  Physical Exam Updated Vital Signs BP 96/64 (BP Location: Right Arm)   Pulse 85   Temp 99 F (37.2 C) (Oral)   Resp 19   Ht '6\' 1"'$  (1.854 m)   Wt 92.1 kg   SpO2 97%   BMI 26.78 kg/m  Physical Exam  ED Results / Procedures / Treatments   Labs (all labs ordered are listed, but only abnormal results are displayed) Labs Reviewed  COMPREHENSIVE METABOLIC PANEL - Abnormal; Notable for the following components:      Result Value   Glucose, Bld 122 (*)    Creatinine, Ser 1.38 (*)    Calcium 8.8 (*)    GFR, Estimated 44 (*)    All other components within normal limits  BRAIN NATRIURETIC PEPTIDE - Abnormal; Notable for the following components:   B Natriuretic Peptide 170.7 (*)    All other components within normal limits  TROPONIN I (HIGH SENSITIVITY) - Abnormal; Notable for the following components:   Troponin I (High  Sensitivity) 34 (*)    All other components within normal limits  TROPONIN I (HIGH SENSITIVITY) - Abnormal; Notable for the following components:   Troponin I (High Sensitivity) 32 (*)    All other components within normal limits  RESP PANEL BY RT-PCR (FLU A&B, COVID) [id number]  GROUP A STREP BY PCR  CBC WITH DIFFERENTIAL/PLATELET  LIPASE, BLOOD  LACTIC ACID, PLASMA  LACTIC ACID, PLASMA    EKG EKG Interpretation  Date/Time:  Monday May 14 2022 09:12:25 EST Ventricular Rate:  82 PR Interval:  158 QRS Duration: 162 QT Interval:  410 QTC Calculation: 479 R Axis:   -67 Text Interpretation: Normal sinus rhythm Left bundle branch block Abnormal ECG When  compared with ECG of 01-Feb-2022 10:45, PREVIOUS ECG IS PRESENT No significant change since last tracing Confirmed by Varney Biles [phone]) on 05/14/2022 10:56:00 AM  Radiology DG Chest Portable 1 View  Result Date: 05/14/2022 CLINICAL DATA:  Chest pain, shortness of breath, cough EXAM: PORTABLE CHEST 1 VIEW COMPARISON:  07/12/2021 FINDINGS: Transverse diameter of heart is increased. There are no signs of pulmonary edema or focal pulmonary consolidation. There is no pleural effusion or pneumothorax. IMPRESSION: There are no signs of pulmonary edema or focal pulmonary consolidation. Electronically Signed   By: Elmer Picker M.D.   On: 05/14/2022 09:41    Procedures Procedures  {Document cardiac monitor, telemetry assessment procedure when appropriate:1}  Medications Ordered in ED Medications  cetirizine (ZYRTEC) tablet 5 mg (5 mg Oral Given 05/14/22 1454)    ED Course/ Medical Decision Making/ A&P Clinical Course as of 05/14/22 1509  Mon May 14, 2022  1504 Patient has had some isolated low blood pressure reads. I reviewed patient's chart.  During her last several visits, her blood pressure has been systolic 725-366 range. Patient is not complaining of any dizziness.  She indicates that she normally has low blood pressure, in the 90s and 100 range.  She also has no evidence of any renal failure.  She is not dizzy or lightheaded.  Stable for discharge at this time. [AN]  1504 Resp Panel by RT-PCR (Flu A&B, Covid) Anterior Nasal Swab COVID-19, flu test negative.  Results discussed with the patient. [AN]  1505 Group A Strep by PCR Test is negative for strep, which was expected since she has cough.  Antibiotic prescribed for wait and watch approach only given high comorbidity burden. [AN]    Clinical Course User Index [AN] Varney Biles, MD                           Medical Decision Making Amount and/or Complexity of Data Reviewed Labs:  Decision-making details documented in  ED Course.  Risk OTC drugs. Prescription drug management.   ***  {Document critical care time when appropriate:1} {Document review of labs and clinical decision tools ie heart score, Chads2Vasc2 etc:1}  {Document your independent review of radiology images, and any outside records:1} {Document your discussion with family members, caretakers, and with consultants:1} {Document social determinants of health affecting pt's care:1} {Document your decision making why or why not admission, treatments were needed:1} Final Clinical Impression(s) / ED Diagnoses Final diagnoses:  Viral URI with cough  Nasal congestion    Rx / DC Orders ED Discharge Orders          Ordered    amoxicillin (AMOXIL) 500 MG capsule  3 times daily        05/14/22 1502    cetirizine (ZYRTEC  ALLERGY) 10 MG tablet  Daily        05/14/22 1503

## 2022-05-16 ENCOUNTER — Telehealth: Payer: Self-pay | Admitting: *Deleted

## 2022-05-16 NOTE — Telephone Encounter (Signed)
Pt called regarding pharmacy not receiving Rx for amoxicillin. After Visit Summary (AVS) states Rx was written but pt did not receive it.  RNCM called in Rx as written to [facility] on [address].

## 2022-05-21 ENCOUNTER — Other Ambulatory Visit (HOSPITAL_COMMUNITY): Payer: Medicaid Other

## 2022-05-21 ENCOUNTER — Other Ambulatory Visit (HOSPITAL_COMMUNITY): Payer: Self-pay

## 2022-05-21 ENCOUNTER — Encounter (HOSPITAL_COMMUNITY): Payer: Self-pay | Admitting: Internal Medicine

## 2022-05-21 ENCOUNTER — Ambulatory Visit (HOSPITAL_COMMUNITY)
Admission: RE | Admit: 2022-05-21 | Discharge: 2022-05-21 | Disposition: A | Discharge status: Home / Self Care | Payer: Medicaid Other | Source: Ambulatory Visit | Attending: Internal Medicine | Admitting: Internal Medicine

## 2022-05-21 VITALS — BP 100/60 | HR 84 | Wt 205.6 lb

## 2022-05-21 DIAGNOSIS — I5042 Chronic combined systolic (congestive) and diastolic (congestive) heart failure: Secondary | ICD-10-CM | POA: Insufficient documentation

## 2022-05-21 DIAGNOSIS — Z905 Acquired absence of kidney: Secondary | ICD-10-CM | POA: Insufficient documentation

## 2022-05-21 DIAGNOSIS — I110 Hypertensive heart disease with heart failure: Secondary | ICD-10-CM | POA: Diagnosis not present

## 2022-05-21 DIAGNOSIS — I2510 Atherosclerotic heart disease of native coronary artery without angina pectoris: Secondary | ICD-10-CM | POA: Diagnosis not present

## 2022-05-21 DIAGNOSIS — I447 Left bundle-branch block, unspecified: Secondary | ICD-10-CM | POA: Insufficient documentation

## 2022-05-21 DIAGNOSIS — R0683 Snoring: Secondary | ICD-10-CM | POA: Insufficient documentation

## 2022-05-21 DIAGNOSIS — I428 Other cardiomyopathies: Secondary | ICD-10-CM | POA: Diagnosis not present

## 2022-05-21 DIAGNOSIS — R5383 Other fatigue: Secondary | ICD-10-CM | POA: Insufficient documentation

## 2022-05-21 DIAGNOSIS — Z79899 Other long term (current) drug therapy: Secondary | ICD-10-CM | POA: Diagnosis not present

## 2022-05-21 DIAGNOSIS — Z87891 Personal history of nicotine dependence: Secondary | ICD-10-CM | POA: Insufficient documentation

## 2022-05-21 LAB — CBC
HCT: 41.3 % (ref 36.0–46.0)
Hemoglobin: 12.8 g/dL (ref 12.0–15.0)
MCH: 28.9 pg (ref 26.0–34.0)
MCHC: 31 g/dL (ref 30.0–36.0)
MCV: 93.2 fL (ref 80.0–100.0)
Platelets: 278 10*3/uL (ref 150–400)
RBC: 4.43 MIL/uL (ref 3.87–5.11)
RDW: 13.8 % (ref 11.5–15.5)
WBC: 5.6 10*3/uL (ref 4.0–10.5)
nRBC: 0 % (ref 0.0–0.2)

## 2022-05-21 LAB — BASIC METABOLIC PANEL
Anion gap: 11 (ref 5–15)
BUN: 15 mg/dL (ref 6–20)
CO2: 26 mmol/L (ref 22–32)
Calcium: 9.3 mg/dL (ref 8.9–10.3)
Chloride: 105 mmol/L (ref 98–111)
Creatinine, Ser: 1.08 mg/dL — ABNORMAL HIGH (ref 0.44–1.00)
GFR, Estimated: 59 mL/min — ABNORMAL LOW (ref >60)
Glucose, Bld: 106 mg/dL — ABNORMAL HIGH (ref 70–99)
Potassium: 4.5 mmol/L (ref 3.5–5.1)
Sodium: 142 mmol/L (ref 135–145)

## 2022-05-21 LAB — BRAIN NATRIURETIC PEPTIDE: B Natriuretic Peptide: 541.4 pg/mL — ABNORMAL HIGH (ref 0.0–100.0)

## 2022-05-21 MED ORDER — DIGOXIN 125 MCG PO TABS: 125.0000 ug | ORAL_TABLET | Freq: Every day | ORAL | 3 refills | Status: DC

## 2022-05-21 MED ORDER — ENTRESTO 24-26 MG PO TABS: 1.0000 | ORAL_TABLET | Freq: Two times a day (BID) | ORAL | 11 refills | Status: DC

## 2022-05-21 NOTE — H&P (View-Only) (Signed)
PCP: Wynne Dust, [facility] Primary Cardiologist: Dr Terrence Dupont HF MD: Dr Haroldine Laws   HPI: Ms Waddell is a 61 y.o.with a history of HTN, GERD, nephrolithiasis, partial right nephrectomy, LBBB, and combined diastolic/systolic HF.      Admitted 1/23 with acute HFrEF. 07/13/21 Echo 20-25%, RV normal, grade II DD , and septal - lateral dyssnchrony. R/L cath w/ Minimal CAD, mildly elevated filling pressures and normal CO. cMRI ? Myocarditis (see impression below). LVEF 18%. Placed on Jardiance and spiro. Not on bb with acute decompensation. No ARNi with hypotension. Discharged on 07/20/21.   Echo 10/25/21 EF 30-35%  Last seen for f/u 06/30. Volume looked good. BP too low to titrate GDMT. Did not wish to proceed with sleep study yet.  She is here today for HF follow-up. Feels very weak. Struggles with ADLs at times. Dizzy when standing. Compliant with meds.  No CP, orthopnea or PND.     Cardiac Studies  - Echo (4/23): EF 30-35%  - R/L cath (1/23): w/ Minimal CAD. EF 20%, Mildly elevated filling pressures and normal CO.  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 LVEF 18% . Although modified [facility] Criteria for myocarditis is technically met, LGE and parametric mapping are not class for this.   ROS: All systems negative except as listed in HPI, PMH and Problem List.  SH:  Social History   Socioeconomic History   Marital status: Married    Spouse name: Richard Minervini   Number of children: 1   Years of education: college   Highest education level: Not on file  Occupational History   Occupation: Higher education careers adviser  Tobacco Use   Smoking status: Former    Packs/day: 0.75    Years: 5.00    Total pack years: 3.75    Types: Cigarettes    Quit date: 01/10/1997    Years since quitting: 25.3   Smokeless tobacco: Never  Vaping Use   Vaping Use: Never used  Substance and Sexual  Activity   Alcohol use: Yes    Alcohol/week: 0.0 standard drinks of alcohol    Comment: social use; once a month   Drug use: No   Sexual activity: Yes    Partners: Male    Birth control/protection: Surgical  Other Topics Concern   Not on file  Social History Narrative   Lives with her husband and their daughter and their dog.   Social Determinants of Health   Financial Resource Strain: Medium Risk (07/27/2021)   Overall Financial Resource Strain (CARDIA)    Difficulty of Paying Living Expenses: Somewhat hard  Food Insecurity: Food Insecurity Present (07/27/2021)   Hunger Vital Sign    Worried About Running Out of Food in the Last Year: Sometimes true    Ran Out of Food in the Last Year: Never true  Transportation Needs: No Transportation Needs (07/20/2021)   PRAPARE - Hydrologist (Medical): No    Lack of Transportation (Non-Medical): No  Physical Activity: Not on file  Stress: Not on file  Social Connections: Not on file  Intimate Partner Violence: Not on file   FH:  Family History  Problem Relation Age of Onset   Hypertension Sister    Eczema Sister    Lupus Sister    Sarcoidosis Mother    Prostate cancer Father    Gout Maternal Grandmother    Diabetes Maternal  Grandmother        leg amputations   Breast cancer Maternal Aunt    Stomach cancer Neg Hx    Colon cancer Neg Hx    Past Medical History:  Diagnosis Date   Anemia    Eczema    GERD (gastroesophageal reflux disease)    History of adenomatous polyp of colon    08-03-2016  tubular adenoma x2 and hyperplastic   History of chronic gastritis 08/03/2016   History of ectopic pregnancy 1988   s/p  left salpingectomy   History of endometriosis    History of kidney stones    History of partial nephrectomy    right pelvis for very large stone   History of pneumonia 07/02/2016   CAP   History of sepsis    07-01-2016  sepsis w/ pyelonephritis, CAP /   12-31-2016  urosepsis w/ kidney  stone obstruction   History of uterine leiomyoma    Hypertension    Left ureteral stone    Nephrolithiasis    left obstructive stone and right non-obstructive stone  per CT 12-31-2016   Urgency of urination    Current Outpatient Medications  Medication Sig Dispense Refill   acetaminophen (TYLENOL) 500 MG tablet Take 500-1,000 mg by mouth every 6 (six) hours as needed (for pain.).     amoxicillin (AMOXIL) 500 MG capsule Take 1 capsule (500 mg total) by mouth 3 (three) times daily. 21 capsule 0   aspirin 81 MG EC tablet Take 81 mg by mouth daily with lunch.     atorvastatin (LIPITOR) 10 MG tablet Take 10 mg by mouth daily.     cetirizine (ZYRTEC ALLERGY) 10 MG tablet Take 1 tablet (10 mg total) by mouth daily. 5 tablet 0   cyclobenzaprine (FLEXERIL) 5 MG tablet Take 5-10 mg by mouth 3 (three) times daily as needed for spasms.     digoxin (LANOXIN) 0.125 MG tablet TAKE 1 TABLET BY MOUTH DAILY 90 tablet 1   empagliflozin (JARDIANCE) 10 MG TABS tablet Take 1 tablet (10 mg total) by mouth daily. 30 tablet 2   furosemide (LASIX) 40 MG tablet Take 1 tablet (40 mg total) by mouth daily as needed. 30 tablet 5   HYDROcodone-acetaminophen (NORCO) 10-325 MG tablet Take 0.5 tablets by mouth every 6 (six) hours as needed for pain.     Multiple Vitamin (MULTIVITAMIN WITH MINERALS) TABS tablet Take 1 tablet by mouth daily with lunch. One A Day for Women 50+     sacubitril-valsartan (ENTRESTO) 24-26 MG Take 1 tablet by mouth 2 (two) times daily. 60 tablet 11   spironolactone (ALDACTONE) 25 MG tablet Take 1 tablet (25 mg total) by mouth daily. 30 tablet 5   No current facility-administered medications for this encounter.   BP 100/60   Pulse 84   Wt 93.3 kg (205 lb 9.6 oz)   SpO2 96%   BMI 27.13 kg/m   Wt Readings from Last 3 Encounters:  05/21/22 93.3 kg (205 lb 9.6 oz)  05/14/22 92.1 kg (203 lb)  03/12/22 95.8 kg (211 lb 3.2 oz)   PHYSICAL EXAM: General:  Weak appearing. No resp  difficulty HEENT: normal Neck: supple. no JVD. Carotids 2+ bilat; no bruits. No lymphadenopathy or thryomegaly appreciated. Cor: PMI nondisplaced. Regular rate & rhythm. No rubs, gallops or murmurs. Lungs: clear Abdomen: soft, nontender, nondistended. No hepatosplenomegaly. No bruits or masses. Good bowel sounds. Extremities: no cyanosis, clubbing, rash, edema Neuro: alert & orientedx3, cranial nerves grossly intact. moves all 4  extremities w/o difficulty. Affect pleasant   ECG: NSR 82 LBBB 152m Personally reviewed   ASSESSMENT & PLAN:  Chronic Combined Systolic/Diastolic HF, NICM.  - 07/13/21 Echo 20-25%, RV normal, grade II DD , and septal -lateral dyssnchrony - 07/2021 cMRI possible myocarditis  LVEF 18% RVEF 33% - [facility] min CAD. RA 6 PCWP 21, CO 6.8/ CI 3.1 - LBBB, QRS previously < 150 ms. QRS 158 ms on ECG today.  - Echo(4/23): EF 30-35% -> EF improving but still with significant LV dysfunction (suspect resolving myocarditis) - Echo 10/23 EF < 20% RV ok  - NYHA IV  - Continue Entresto 24/26 mg bid.  - Continue digoxin 0.125 mg daily.  - Continue spironolactone 25 mg daily  - Continue Jardiance 10 mg daily.  - Stop Toprol Toprol XL 25 mg daily - LVEF severely reduced. I am concerned for possible genetic DCM and need for advanced therapies. We discussed possible admission today for RHC but she is ok to wait until Wednesday as an outpatient. Pending results may also need CPX.   2. HTN - BP low   3. LBBB - QRS 1565mbut no significant dyssynchrony on echo  - Doubt CRT will be sufficient to help   4. Partial R Nephrectomy - Labs today  5. Snoring/daytime fatigue - Deferred sleep study  DaGlori BickersD 12:26 PM

## 2022-05-21 NOTE — Addendum Note (Signed)
Encounter addended by: Jerl Mina, RN on: 05/21/2022 2:47 PM  Actions taken: Charge Capture section accepted

## 2022-05-21 NOTE — Progress Notes (Signed)
PCP: Wynne Dust, [facility] Primary Cardiologist: Dr Terrence Dupont HF MD: Dr Haroldine Laws   HPI: Ms Hanline is a 61 y.o.with a history of HTN, GERD, nephrolithiasis, partial right nephrectomy, LBBB, and combined diastolic/systolic HF.      Admitted 1/23 with acute HFrEF. 07/13/21 Echo 20-25%, RV normal, grade II DD , and septal - lateral dyssnchrony. R/L cath w/ Minimal CAD, mildly elevated filling pressures and normal CO. cMRI ? Myocarditis (see impression below). LVEF 18%. Placed on Jardiance and spiro. Not on bb with acute decompensation. No ARNi with hypotension. Discharged on 07/20/21.   Echo 10/25/21 EF 30-35%  Last seen for f/u 06/30. Volume looked good. BP too low to titrate GDMT. Did not wish to proceed with sleep study yet.  She is here today for HF follow-up. Feels very weak. Struggles with ADLs at times. Dizzy when standing. Compliant with meds.  No CP, orthopnea or PND.     Cardiac Studies  - Echo (4/23): EF 30-35%  - R/L cath (1/23): w/ Minimal CAD. EF 20%, Mildly elevated filling pressures and normal CO.  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 LVEF 18% . Although modified [facility] Criteria for myocarditis is technically met, LGE and parametric mapping are not class for this.   ROS: All systems negative except as listed in HPI, PMH and Problem List.  SH:  Social History   Socioeconomic History   Marital status: Married    Spouse name: Richard Zeitz   Number of children: 1   Years of education: college   Highest education level: Not on file  Occupational History   Occupation: Higher education careers adviser  Tobacco Use   Smoking status: Former    Packs/day: 0.75    Years: 5.00    Total pack years: 3.75    Types: Cigarettes    Quit date: 01/10/1997    Years since quitting: 25.3   Smokeless tobacco: Never  Vaping Use   Vaping Use: Never used  Substance and Sexual  Activity   Alcohol use: Yes    Alcohol/week: 0.0 standard drinks of alcohol    Comment: social use; once a month   Drug use: No   Sexual activity: Yes    Partners: Male    Birth control/protection: Surgical  Other Topics Concern   Not on file  Social History Narrative   Lives with her husband and their daughter and their dog.   Social Determinants of Health   Financial Resource Strain: Medium Risk (07/27/2021)   Overall Financial Resource Strain (CARDIA)    Difficulty of Paying Living Expenses: Somewhat hard  Food Insecurity: Food Insecurity Present (07/27/2021)   Hunger Vital Sign    Worried About Running Out of Food in the Last Year: Sometimes true    Ran Out of Food in the Last Year: Never true  Transportation Needs: No Transportation Needs (07/20/2021)   PRAPARE - Hydrologist (Medical): No    Lack of Transportation (Non-Medical): No  Physical Activity: Not on file  Stress: Not on file  Social Connections: Not on file  Intimate Partner Violence: Not on file   FH:  Family History  Problem Relation Age of Onset   Hypertension Sister    Eczema Sister    Lupus Sister    Sarcoidosis Mother    Prostate cancer Father    Gout Maternal Grandmother    Diabetes Maternal  Grandmother        leg amputations   Breast cancer Maternal Aunt    Stomach cancer Neg Hx    Colon cancer Neg Hx    Past Medical History:  Diagnosis Date   Anemia    Eczema    GERD (gastroesophageal reflux disease)    History of adenomatous polyp of colon    08-03-2016  tubular adenoma x2 and hyperplastic   History of chronic gastritis 08/03/2016   History of ectopic pregnancy 1988   s/p  left salpingectomy   History of endometriosis    History of kidney stones    History of partial nephrectomy    right pelvis for very large stone   History of pneumonia 07/02/2016   CAP   History of sepsis    07-01-2016  sepsis w/ pyelonephritis, CAP /   12-31-2016  urosepsis w/ kidney  stone obstruction   History of uterine leiomyoma    Hypertension    Left ureteral stone    Nephrolithiasis    left obstructive stone and right non-obstructive stone  per CT 12-31-2016   Urgency of urination    Current Outpatient Medications  Medication Sig Dispense Refill   acetaminophen (TYLENOL) 500 MG tablet Take 500-1,000 mg by mouth every 6 (six) hours as needed (for pain.).     amoxicillin (AMOXIL) 500 MG capsule Take 1 capsule (500 mg total) by mouth 3 (three) times daily. 21 capsule 0   aspirin 81 MG EC tablet Take 81 mg by mouth daily with lunch.     atorvastatin (LIPITOR) 10 MG tablet Take 10 mg by mouth daily.     cetirizine (ZYRTEC ALLERGY) 10 MG tablet Take 1 tablet (10 mg total) by mouth daily. 5 tablet 0   cyclobenzaprine (FLEXERIL) 5 MG tablet Take 5-10 mg by mouth 3 (three) times daily as needed for spasms.     digoxin (LANOXIN) 0.125 MG tablet TAKE 1 TABLET BY MOUTH DAILY 90 tablet 1   empagliflozin (JARDIANCE) 10 MG TABS tablet Take 1 tablet (10 mg total) by mouth daily. 30 tablet 2   furosemide (LASIX) 40 MG tablet Take 1 tablet (40 mg total) by mouth daily as needed. 30 tablet 5   HYDROcodone-acetaminophen (NORCO) 10-325 MG tablet Take 0.5 tablets by mouth every 6 (six) hours as needed for pain.     Multiple Vitamin (MULTIVITAMIN WITH MINERALS) TABS tablet Take 1 tablet by mouth daily with lunch. One A Day for Women 50+     sacubitril-valsartan (ENTRESTO) 24-26 MG Take 1 tablet by mouth 2 (two) times daily. 60 tablet 11   spironolactone (ALDACTONE) 25 MG tablet Take 1 tablet (25 mg total) by mouth daily. 30 tablet 5   No current facility-administered medications for this encounter.   BP 100/60   Pulse 84   Wt 93.3 kg (205 lb 9.6 oz)   SpO2 96%   BMI 27.13 kg/m   Wt Readings from Last 3 Encounters:  05/21/22 93.3 kg (205 lb 9.6 oz)  05/14/22 92.1 kg (203 lb)  03/12/22 95.8 kg (211 lb 3.2 oz)   PHYSICAL EXAM: General:  Weak appearing. No resp  difficulty HEENT: normal Neck: supple. no JVD. Carotids 2+ bilat; no bruits. No lymphadenopathy or thryomegaly appreciated. Cor: PMI nondisplaced. Regular rate & rhythm. No rubs, gallops or murmurs. Lungs: clear Abdomen: soft, nontender, nondistended. No hepatosplenomegaly. No bruits or masses. Good bowel sounds. Extremities: no cyanosis, clubbing, rash, edema Neuro: alert & orientedx3, cranial nerves grossly intact. moves all 4  extremities w/o difficulty. Affect pleasant   ECG: NSR 82 LBBB 121m Personally reviewed   ASSESSMENT & PLAN:  Chronic Combined Systolic/Diastolic HF, NICM.  - 07/13/21 Echo 20-25%, RV normal, grade II DD , and septal -lateral dyssnchrony - 07/2021 cMRI possible myocarditis  LVEF 18% RVEF 33% - [facility] CAD. RA 6 PCWP 21, CO 6.8/ CI 3.1 - LBBB, QRS previously < 150 ms. QRS 158 ms on ECG today.  - Echo(4/23): EF 30-35% -> EF improving but still with significant LV dysfunction (suspect resolving myocarditis) - Echo 10/23 EF < 20% RV ok  - NYHA IV  - Continue Entresto 24/26 mg bid.  - Continue digoxin 0.125 mg daily.  - Continue spironolactone 25 mg daily  - Continue Jardiance 10 mg daily.  - Stop Toprol Toprol XL 25 mg daily - LVEF severely reduced. I am concerned for possible genetic DCM and need for advanced therapies. We discussed possible admission today for RHC but she is ok to wait until Wednesday as an outpatient. Pending results may also need CPX.   2. HTN - BP low   3. LBBB - QRS 1551mbut no significant dyssynchrony on echo  - Doubt CRT will be sufficient to help   4. Partial R Nephrectomy - Labs today  5. Snoring/daytime fatigue - Deferred sleep study  DaGlori BickersD 12:26 PM

## 2022-05-21 NOTE — Patient Instructions (Signed)
STOP Toprol  Labs done today, your results will be available in MyChart, we will contact you for abnormal readings.   You are scheduled for a Cardiac Catheterization on Wednesday, November 22 with Dr. Glori Bickers.  1. Please arrive at the [facility] (Main Entrance A) at [facility]: [address] [place], [place] [address] at 8:00 AM (This time is two hours before your procedure to ensure your preparation). Free valet parking service is available.   Special note: Every effort is made to have your procedure done on time. Please understand that emergencies sometimes delay scheduled procedures.  2. Diet: Do not eat solid foods after midnight.  The patient may have clear liquids until 5am upon the day of the procedure.  3.  Medication instructions in preparation for your procedure:   Contrast Allergy: No  HOLD Lasix, Spironolactone and Jardiance the morning of your procedure   On the morning of your procedure, take your Aspirin 81 mg and any morning medicines NOT listed above.  You may use sips of water.  5. Plan for one night stay--bring personal belongings. 6. Bring a current list of your medications and current insurance cards. 7. You MUST have a responsible person to drive you home. 8. Someone MUST be with you the first 24 hours after you arrive home or your discharge will be delayed. 9. Please wear clothes that are easy to get on and off and wear slip-on shoes.  If you have any questions or concerns before your next appointment please send [place] a message through [place] or call our office at [phone].    TO LEAVE A MESSAGE FOR THE NURSE SELECT OPTION 2, PLEASE LEAVE A MESSAGE INCLUDING: YOUR NAME DATE OF BIRTH CALL BACK NUMBER REASON FOR CALL**this is important as we prioritize the call backs  YOU WILL RECEIVE A CALL BACK THE SAME DAY AS LONG AS YOU CALL BEFORE 4:00 PM  At the [facility], you and your health needs are our priority. As part of  our continuing mission to provide you with exceptional heart care, we have created designated Provider Care Teams. These Care Teams include your primary Cardiologist (physician) and Advanced Practice Providers (APPs- Physician Assistants and Nurse Practitioners) who all work together to provide you with the care you need, when you need it.   You may see any of the following providers on your designated Care Team at your next follow up: Dr Glori Bickers Dr Loralie Champagne Dr. Roxana Hires, NP Lyda Jester, [place] [facility] [place], [place] Forestine Na, NP Audry Riles, PharmD   Please be sure to bring in all your medications bottles to every appointment.

## 2022-05-21 NOTE — Progress Notes (Signed)
ReDS Vest / Clip - 05/21/22 1300       ReDS Vest / Clip   Station Marker C    Ruler Value 28.5    ReDS Value Range Moderate volume overload    ReDS Actual Value 36

## 2022-05-23 ENCOUNTER — Encounter (HOSPITAL_COMMUNITY): Payer: Self-pay | Admitting: Internal Medicine

## 2022-05-23 ENCOUNTER — Ambulatory Visit (HOSPITAL_COMMUNITY)
Admission: RE | Admit: 2022-05-23 | Discharge: 2022-05-23 | Disposition: A | Discharge status: Home / Self Care | Payer: Medicaid Other | Source: Ambulatory Visit | Attending: Internal Medicine | Admitting: Internal Medicine

## 2022-05-23 ENCOUNTER — Encounter (HOSPITAL_COMMUNITY)
Admission: RE | Disposition: A | Discharge status: Home / Self Care | Payer: Self-pay | Source: Ambulatory Visit | Attending: Internal Medicine

## 2022-05-23 ENCOUNTER — Other Ambulatory Visit: Payer: Self-pay

## 2022-05-23 DIAGNOSIS — I110 Hypertensive heart disease with heart failure: Secondary | ICD-10-CM | POA: Insufficient documentation

## 2022-05-23 DIAGNOSIS — R0683 Snoring: Secondary | ICD-10-CM | POA: Insufficient documentation

## 2022-05-23 DIAGNOSIS — I428 Other cardiomyopathies: Secondary | ICD-10-CM | POA: Diagnosis not present

## 2022-05-23 DIAGNOSIS — I2510 Atherosclerotic heart disease of native coronary artery without angina pectoris: Secondary | ICD-10-CM | POA: Insufficient documentation

## 2022-05-23 DIAGNOSIS — Z79899 Other long term (current) drug therapy: Secondary | ICD-10-CM | POA: Insufficient documentation

## 2022-05-23 DIAGNOSIS — I447 Left bundle-branch block, unspecified: Secondary | ICD-10-CM | POA: Diagnosis not present

## 2022-05-23 DIAGNOSIS — I5042 Chronic combined systolic (congestive) and diastolic (congestive) heart failure: Secondary | ICD-10-CM | POA: Insufficient documentation

## 2022-05-23 DIAGNOSIS — I5022 Chronic systolic (congestive) heart failure: Secondary | ICD-10-CM

## 2022-05-23 DIAGNOSIS — K219 Gastro-esophageal reflux disease without esophagitis: Secondary | ICD-10-CM | POA: Diagnosis not present

## 2022-05-23 DIAGNOSIS — Z87891 Personal history of nicotine dependence: Secondary | ICD-10-CM | POA: Diagnosis not present

## 2022-05-23 HISTORY — PX: RIGHT HEART CATH: CATH118263

## 2022-05-23 LAB — GLUCOSE, CAPILLARY
Glucose-Capillary: 148 mg/dL — ABNORMAL HIGH (ref 70–99)
Glucose-Capillary: 125 mg/dL — ABNORMAL HIGH (ref 70–99)

## 2022-05-23 SURGERY — RIGHT HEART CATH
Anesthesia: LOCAL

## 2022-05-23 MED ORDER — HEPARIN (PORCINE) IN NACL 1000-0.9 UT/500ML-% IV SOLN
INTRAVENOUS | Status: DC | PRN
  Administered 2022-05-23: 500 mL

## 2022-05-23 MED ORDER — SODIUM CHLORIDE 0.9% FLUSH: 3.0000 mL | INTRAVENOUS | Status: DC | PRN

## 2022-05-23 MED ORDER — ACETAMINOPHEN 325 MG PO TABS: 650.0000 mg | ORAL_TABLET | ORAL | Status: DC | PRN

## 2022-05-23 MED ORDER — HYDRALAZINE HCL 20 MG/ML IJ SOLN: 10.0000 mg | INTRAMUSCULAR | Status: DC | PRN

## 2022-05-23 MED ORDER — SODIUM CHLORIDE 0.9 % IV SOLN: 250.0000 mL | INTRAVENOUS | Status: DC | PRN

## 2022-05-23 MED ORDER — LIDOCAINE HCL (PF) 1 % IJ SOLN
INTRAMUSCULAR | Status: DC | PRN
  Administered 2022-05-23: 2 mL

## 2022-05-23 MED ORDER — ONDANSETRON HCL 4 MG/2ML IJ SOLN: 4.0000 mg | Freq: Four times a day (QID) | INTRAMUSCULAR | Status: DC | PRN

## 2022-05-23 MED ORDER — LIDOCAINE HCL (PF) 1 % IJ SOLN
INTRAMUSCULAR | Status: AC
  Filled 2022-05-23: qty 30

## 2022-05-23 MED ORDER — LABETALOL HCL 5 MG/ML IV SOLN: 10.0000 mg | INTRAVENOUS | Status: DC | PRN

## 2022-05-23 MED ORDER — SODIUM CHLORIDE 0.9 % IV SOLN: INTRAVENOUS | Status: DC

## 2022-05-23 MED ORDER — SODIUM CHLORIDE 0.9% FLUSH: 3.0000 mL | Freq: Two times a day (BID) | INTRAVENOUS | Status: DC

## 2022-05-23 MED ORDER — HEPARIN (PORCINE) IN NACL 1000-0.9 UT/500ML-% IV SOLN
INTRAVENOUS | Status: AC
  Filled 2022-05-23: qty 500

## 2022-05-23 SURGICAL SUPPLY — 9 items
CATH SWAN GANZ 7F STRAIGHT (CATHETERS) IMPLANT
GLIDESHEATH SLENDER 7FR .021G (SHEATH) IMPLANT
PACK CARDIAC CATHETERIZATION (CUSTOM PROCEDURE TRAY) ×1 IMPLANT
PROTECTION STATION PRESSURIZED (MISCELLANEOUS) ×1
SHEATH GLIDE SLENDER 4/5FR (SHEATH) IMPLANT
STATION PROTECTION PRESSURIZED (MISCELLANEOUS) IMPLANT
TRANSDUCER W/STOPCOCK (MISCELLANEOUS) ×1 IMPLANT
TUBING ART PRESS 72  MALE/FEM (TUBING) ×1
TUBING ART PRESS 72 MALE/FEM (TUBING) IMPLANT

## 2022-05-23 NOTE — Interval H&P Note (Signed)
History and Physical Interval Note:  05/23/2022 8:59 AM  [name]  has presented today for surgery, with the diagnosis of heart failure.  The various methods of treatment have been discussed with the patient and family. After consideration of risks, benefits and other options for treatment, the patient has consented to  Procedure(s): RIGHT HEART CATH (N/A) as a surgical intervention.  The patient's history has been reviewed, patient examined, no change in status, stable for surgery.  I have reviewed the patient's chart and labs.  Questions were answered to the patient's satisfaction.     Lynn Estrada

## 2022-05-25 LAB — POCT I-STAT EG7
Acid-Base Excess: 2 mmol/L (ref 0.0–2.0)
Acid-Base Excess: 1 mmol/L (ref 0.0–2.0)
Bicarbonate: 26.9 mmol/L (ref 20.0–28.0)
Bicarbonate: 26.3 mmol/L (ref 20.0–28.0)
Calcium, Ion: 1.22 mmol/L (ref 1.15–1.40)
Calcium, Ion: 1.21 mmol/L (ref 1.15–1.40)
HCT: 34 % — ABNORMAL LOW (ref 36.0–46.0)
HCT: 34 % — ABNORMAL LOW (ref 36.0–46.0)
Hemoglobin: 11.6 g/dL — ABNORMAL LOW (ref 12.0–15.0)
Hemoglobin: 11.6 g/dL — ABNORMAL LOW (ref 12.0–15.0)
O2 Saturation: 60 %
O2 Saturation: 63 %
Potassium: 4.3 mmol/L (ref 3.5–5.1)
Potassium: 4.2 mmol/L (ref 3.5–5.1)
Sodium: 144 mmol/L (ref 135–145)
Sodium: 143 mmol/L (ref 135–145)
TCO2: 28 mmol/L (ref 22–32)
TCO2: 28 mmol/L (ref 22–32)
pCO2, Ven: 43.5 mmHg — ABNORMAL LOW (ref 44–60)
pCO2, Ven: 42.6 mmHg — ABNORMAL LOW (ref 44–60)
pH, Ven: 7.399 (ref 7.25–7.43)
pH, Ven: 7.399 (ref 7.25–7.43)
pO2, Ven: 31 mmHg — CL (ref 32–45)
pO2, Ven: 33 mmHg (ref 32–45)

## 2022-06-18 ENCOUNTER — Ambulatory Visit (HOSPITAL_COMMUNITY): Payer: Medicaid Other | Attending: Physician Assistant

## 2022-06-18 ENCOUNTER — Other Ambulatory Visit (HOSPITAL_COMMUNITY): Payer: Self-pay | Admitting: *Deleted

## 2022-06-18 DIAGNOSIS — I5042 Chronic combined systolic (congestive) and diastolic (congestive) heart failure: Secondary | ICD-10-CM

## 2022-06-18 DIAGNOSIS — I5022 Chronic systolic (congestive) heart failure: Secondary | ICD-10-CM

## 2022-06-26 ENCOUNTER — Telehealth (HOSPITAL_COMMUNITY): Payer: Self-pay

## 2022-06-26 NOTE — Telephone Encounter (Signed)
Forms faxed on 06/22/22 at 3pm patient informed

## 2022-06-28 ENCOUNTER — Other Ambulatory Visit (HOSPITAL_COMMUNITY): Payer: Self-pay | Admitting: Family Medicine

## 2022-06-28 ENCOUNTER — Other Ambulatory Visit (HOSPITAL_COMMUNITY): Payer: Self-pay | Admitting: Internal Medicine

## 2022-06-29 ENCOUNTER — Telehealth (HOSPITAL_COMMUNITY): Payer: Self-pay

## 2022-06-29 NOTE — Telephone Encounter (Signed)
Lynn Estrada at [facility] is calling requesting an update on treatment plan. She is requesting Echo, Cath Report, and most recent note be faxed to [phone].  Her direct number is [phone]  Emer requested to send message to her and Nira Conn.

## 2022-06-29 NOTE — Telephone Encounter (Signed)
Spoke with patient to see if she knew the name of the person who dealt with her claims. She was unsure but will call them to find. Want to make sure this is correct so we are not violating HIPAA

## 2022-07-03 ENCOUNTER — Telehealth (HOSPITAL_COMMUNITY): Payer: Self-pay

## 2022-07-03 NOTE — Telephone Encounter (Signed)
[place] called and said she heard from her insurance company and that Lynn Estrada is the only person that should be requesting any information and it should come by fax.

## 2022-07-11 ENCOUNTER — Encounter: Payer: Self-pay | Admitting: Physical Therapy

## 2022-07-11 ENCOUNTER — Other Ambulatory Visit: Payer: Self-pay

## 2022-07-11 ENCOUNTER — Ambulatory Visit: Payer: Medicaid Other | Attending: Physician Assistant | Admitting: Physical Therapy

## 2022-07-11 DIAGNOSIS — G8929 Other chronic pain: Secondary | ICD-10-CM | POA: Diagnosis present

## 2022-07-11 DIAGNOSIS — M6281 Muscle weakness (generalized): Secondary | ICD-10-CM | POA: Diagnosis present

## 2022-07-11 DIAGNOSIS — R600 Localized edema: Secondary | ICD-10-CM | POA: Insufficient documentation

## 2022-07-11 DIAGNOSIS — M25511 Pain in right shoulder: Secondary | ICD-10-CM | POA: Diagnosis not present

## 2022-07-11 NOTE — Therapy (Signed)
OUTPATIENT PHYSICAL THERAPY SHOULDER EVALUATION   Patient Name: Lynn Estrada MRN: [id number] DOB:Jan 11, 1961, 62 y.o., female Today's Date: 07/11/2022   PT End of Session - 07/11/22 1037     Visit Number 1    Number of Visits --   1-2x/week   Date for PT Re-Evaluation 09/05/22    Authorization Type [employer] [place] [employer]    PT Start Time [phone]    PT Stop Time 1032    PT Time Calculation (min) 37 min             Past Medical History:  Diagnosis Date   Anemia    Eczema    GERD (gastroesophageal reflux disease)    History of adenomatous polyp of colon    08-03-2016  tubular adenoma x2 and hyperplastic   History of chronic gastritis 08/03/2016   History of ectopic pregnancy 1988   s/p  left salpingectomy   History of endometriosis    History of kidney stones    History of partial nephrectomy    right pelvis for very large stone   History of pneumonia 07/02/2016   CAP   History of sepsis    07-01-2016  sepsis w/ pyelonephritis, CAP /   12-31-2016  urosepsis w/ kidney stone obstruction   History of uterine leiomyoma    Hypertension    Left ureteral stone    Nephrolithiasis    left obstructive stone and right non-obstructive stone  per CT 12-31-2016   Urgency of urination    Past Surgical History:  Procedure Laterality Date   ABDOMINAL HYSTERECTOMY  01/20/2007   w/ Lysis Adhesions/  Left salpingoophorectomy/  Right Salpingectomy   CHOLECYSTECTOMY  1994   and Right Partial Nephrectomy (pelvis for very large stone)   CYSTOSCOPY W/ URETERAL STENT PLACEMENT Left 12/31/2016   Procedure: CYSTOSCOPY WITH RETROGRADE PYELOGRAM/ LEFT URETERAL STENT PLACEMENT;  Surgeon: Alexis Frock, MD;  Location: [facility];  Service: Urology;  Laterality: Left;   CYSTOSCOPY WITH RETROGRADE PYELOGRAM, URETEROSCOPY AND STENT PLACEMENT Left 01/18/2017   Procedure: 1ST STAGE CYSTOSCOPY WITH RETROGRADE PYELOGRAM, URETEROSCOPY AND STENT REPLACEMENT;  Surgeon: Alexis Frock, MD;  Location: [facility];  Service: Urology;  Laterality: Left;   CYSTOSCOPY WITH RETROGRADE PYELOGRAM, URETEROSCOPY AND STENT PLACEMENT Left 02/01/2017   Procedure: 2ND STAGE CYSTOSCOPY WITH RETROGRADE PYELOGRAM, URETEROSCOPY AND STENT REPLACEMENT;  Surgeon: Alexis Frock, MD;  Location: [facility];  Service: Urology;  Laterality: Left;   ECTOPIC PREGNANCY SURGERY  1988   Left Salpingectomy   HOLMIUM LASER APPLICATION Left 8/91/6945   Procedure: HOLMIUM LASER APPLICATION;  Surgeon: Alexis Frock, MD;  Location: [facility];  Service: Urology;  Laterality: Left;   HOLMIUM LASER APPLICATION Left 0/08/8880   Procedure: HOLMIUM LASER APPLICATION;  Surgeon: Alexis Frock, MD;  Location: [facility];  Service: Urology;  Laterality: Left;   LUMBAR DISC SURGERY  01/1999   right L5 -- S1   RE-EXPLORATION LUMBAR/  LAMINECTOMY AND MICRODISKECTOMY  10/14/2001   right L5 -- S1   RIGHT HEART CATH N/A 05/23/2022   Procedure: RIGHT HEART CATH;  Surgeon: Jolaine Artist, MD;  Location: [facility];  Service: Cardiovascular;  Laterality: N/A;   RIGHT/LEFT HEART CATH AND CORONARY ANGIOGRAPHY N/A 07/18/2021   Procedure: RIGHT/LEFT HEART CATH AND CORONARY ANGIOGRAPHY;  Surgeon: Jolaine Artist, MD;  Location: [facility];  Service: Cardiovascular;  Laterality: N/A;   TUBAL LIGATION Bilateral 80/08/4915   PPTL   UMBILICAL HERNIA  REPAIR  age 52   Patient Active Problem List   Diagnosis Date Noted   LBBB (left bundle branch block) 02/01/2022   Chronic combined systolic and diastolic heart failure ([place]) 07/27/2021   Acute combined systolic (congestive) and diastolic (congestive) heart failure ([facility])    Hypocalcemia 07/12/2021   Elevated troponin 07/12/2021   Suspected COVID-19 virus infection 06/11/2019   Acute respiratory failure with hypoxia ([place]) 06/11/2019   Multifocal pneumonia 06/11/2019   Ureteral stone with hydronephrosis 12/31/2016    Normocytic anemia 07/10/2016   Essential hypertension 07/26/2015   Eczema 07/26/2015   Overweight (BMI 25.0-29.9) 07/26/2015    PCP: [name], PA  REFERRING PROVIDER: Wendall Mola, PA-C  THERAPY DIAG:  Chronic right shoulder pain - Plan: PT plan of care cert/re-cert  Muscle weakness - Plan: PT plan of care cert/re-cert  REFERRING DIAG: right shoulder pain  Rationale for Evaluation and Treatment:  Rehabilitation  SUBJECTIVE:  PERTINENT PAST HISTORY:  Heart failure (does not tolerate reclined positions), history of heart attack      PRECAUTIONS: Heart failure (does not tolerate reclined positions)  WEIGHT BEARING RESTRICTIONS No  FALLS:  Has patient fallen in last 6 months? No, Number of falls: 0  MOI/History of condition:  Onset date: > 3 months  SUBJECTIVE STATEMENT  Lynn Estrada is a 62 y.o. female who presents to clinic with chief complaint of R shoulder pain.  The shoulder pain has been chronic.  She had a surgery on her R hand which she feels like agg'd her pain.  She also had a heart cath through the R arm which she also feels like contributed her her pain.  She denies injury to R shoulder.  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  The symptoms have slowly increased.  The pain can occur at rest but is worse with movement.  She states she had x-rays of her neck and shoulder which showed no significant findings.  From referring provider:      Red flags:  denies bil numbness and tingling  Pain:  Are you having pain? 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     SENSATION:  Light touch: Appears intact   PALPATION: Exquisite TTP R anterior shoulder and LH biceps tendon  UPPER EXTREMITY AROM:  ROM Right 07/11/2022 Left 07/11/2022  Shoulder flexion 65 135  Shoulder abduction 65 135  Shoulder internal rotation    Shoulder external rotation    Functional IR To greater troch T1  Functional ER To ear* T4*  Shoulder extension    Elbow extension    Elbow flexion     (Blank rows = not tested, N = WNL, * = concordant pain with testing)  UPPER EXTREMITY MMT:  MMT Right 07/11/2022 Left 07/11/2022  Shoulder flexion Unable d/t pain   Shoulder abduction (C5) Unable d/t pain   Shoulder ER 4 4  Shoulder IR 3* 4  Middle trapezius    Lower trapezius    Shoulder extension    Grip strength    Cervical flexion (C1,C2)    Cervical S/B (C3)  Shoulder shrug (C4)    Elbow flexion (C6) 3*   Elbow ext (C7)    Thumb ext (C8)    Finger abd (T1)    Grossly     (Blank rows = not tested, score listed is out of 5 possible points.  N = WNL, D = diminished, C = clear for gross weakness with myotome testing, * = concordant pain with testing)  UPPER EXTREMITY PROM:  PROM Right 07/11/2022 Left 07/11/2022  Shoulder flexion 90   Shoulder abduction 85   Shoulder internal rotation    Shoulder external rotation at 45 ~30 degrees   Functional IR    Functional ER    Shoulder extension    Elbow extension    Elbow flexion     (Blank rows = not tested, N = WNL, * = concordant pain with testing)  SHOULDER SPECIAL TESTS:  RC test cluster: + painful arc, - ER, - drop arm  Yergason's Test (+)   PATIENT SURVEYS:  Quick Dash 36/55    TODAY'S TREATMENT:  Creating, reviewing, and completing below HEP   PATIENT EDUCATION:  POC, diagnosis, prognosis, HEP, and outcome measures.  Pt educated via explanation, demonstration, and handout (HEP).  Pt confirms understanding  verbally.   HOME EXERCISE PROGRAM: Access Code: [email or web address] URL: [email or web address] Date: 07/11/2022 Prepared by: Shearon Balo  Exercises - Seated Isometric Elbow Flexion  - 3 x daily - 7 x weekly - 3 sets - 10 reps - 3'' hold  ASSESSMENT:  CLINICAL IMPRESSION: Lynn Estrada is a 62 y.o. female who presents to clinic with signs and sxs consistent with R shoulder pain secondary to [facility] of biceps pathology.  Concordant pain with palpation of LH of biceps tendon as well as resisted elbow flexion.  Unable to rule out sub scap contribution.  Pt is limited in AROM and PROM but this does not fit a clear capsular pattern and PROM > AROM lowering likelihood of AC.  ER MMT does not cause a significant increase in pain, lowering likelihood of infra/supra pathology.  OBJECTIVE IMPAIRMENTS: Pain, shoulder ROM, shoulder strength  ACTIVITY LIMITATIONS: lifting, reaching, housework  PERSONAL FACTORS: See medical history and pertinent history   REHAB POTENTIAL: Fair chronic  CLINICAL DECISION MAKING: Stable/uncomplicated  EVALUATION COMPLEXITY: Low   GOALS:   SHORT TERM GOALS: Target date: 08/08/2022  Lynn Estrada will be >75% HEP compliant to improve carryover between sessions and facilitate independent management of condition  Evaluation (07/11/2022): ongoing Goal status: INITIAL   LONG TERM GOALS: Target date: 09/05/2022  Lynn Estrada will show a >/= 15 pt improvement in her QUICK DASH score (MCID is 13.4 pts or 8%) as a proxy for functional improvement   Evaluation/Baseline (07/11/2022): 35/55 pts Goal status: INITIAL   2.  Lynn Estrada will self report >/= 50% decrease in pain from evaluation   Evaluation/Baseline (07/11/2022): 10/10 max pain Goal status: INITIAL   3.  Lynn Estrada will demonstrate >120 degrees of active ROM in flexion to allow completion of activities involving reaching OH, not limited by pain  Evaluation/Baseline (07/11/2022): 65 degrees Goal status:  INITIAL   4.  Lynn Estrada will be able to complete light to moderate housework, not limited by pain  Evaluation/Baseline (07/11/2022): limited Goal status: INITIAL   PLAN: PT FREQUENCY: 1-2x/week  PT DURATION: 8 weeks (Ending 09/05/2022)  PLANNED INTERVENTIONS: Therapeutic exercises, Aquatic therapy, Therapeutic activity, Neuro Muscular re-education, Gait training, Patient/Family education, Joint mobilization, Dry Needling, Electrical stimulation, Spinal mobilization and/or manipulation, Moist heat, Taping, Vasopneumatic device, Ionotophoresis '4mg'$ /ml Dexamethasone, and Manual  therapy  PLAN FOR NEXT SESSION: MT for ROM, GH joint mobs, progressive biceps loading   Shearon Balo PT, DPT 07/11/2022, 10:40 AM

## 2022-07-16 ENCOUNTER — Other Ambulatory Visit (HOSPITAL_COMMUNITY): Payer: Self-pay | Admitting: Family Medicine

## 2022-07-18 NOTE — Therapy (Signed)
OUTPATIENT PHYSICAL THERAPY TREATMENT NOTE   Patient Name: Lynn Estrada MRN: [id number] DOB:11-26-1960, 62 y.o., female Today's Date: 07/19/2022  PCP: [name], PA  REFERRING PROVIDER: Wendall Mola, PA-C   END OF SESSION:   PT End of Session - 07/19/22 0957     Visit Number 2    Date for PT Re-Evaluation 09/05/22    Authorization Type [employer] - Jeanie Cooks    PT Start Time 1000    PT Stop Time 1050    PT Time Calculation (min) 50 min    Activity Tolerance Patient tolerated treatment well;Patient limited by pain    Behavior During Therapy [facility] for tasks assessed/performed             Past Medical History:  Diagnosis Date   Anemia    Eczema    GERD (gastroesophageal reflux disease)    History of adenomatous polyp of colon    08-03-2016  tubular adenoma x2 and hyperplastic   History of chronic gastritis 08/03/2016   History of ectopic pregnancy 1988   s/p  left salpingectomy   History of endometriosis    History of kidney stones    History of partial nephrectomy    right pelvis for very large stone   History of pneumonia 07/02/2016   CAP   History of sepsis    07-01-2016  sepsis w/ pyelonephritis, CAP /   12-31-2016  urosepsis w/ kidney stone obstruction   History of uterine leiomyoma    Hypertension    Left ureteral stone    Nephrolithiasis    left obstructive stone and right non-obstructive stone  per CT 12-31-2016   Urgency of urination    Past Surgical History:  Procedure Laterality Date   ABDOMINAL HYSTERECTOMY  01/20/2007   w/ Lysis Adhesions/  Left salpingoophorectomy/  Right Salpingectomy   CHOLECYSTECTOMY  1994   and Right Partial Nephrectomy (pelvis for very large stone)   CYSTOSCOPY W/ URETERAL STENT PLACEMENT Left 12/31/2016   Procedure: CYSTOSCOPY WITH RETROGRADE PYELOGRAM/ LEFT URETERAL STENT PLACEMENT;  Surgeon: Alexis Frock, MD;  Location: [facility];  Service: Urology;  Laterality: Left;   CYSTOSCOPY WITH RETROGRADE PYELOGRAM,  URETEROSCOPY AND STENT PLACEMENT Left 01/18/2017   Procedure: 1ST STAGE CYSTOSCOPY WITH RETROGRADE PYELOGRAM, URETEROSCOPY AND STENT REPLACEMENT;  Surgeon: Alexis Frock, MD;  Location: [facility];  Service: Urology;  Laterality: Left;   CYSTOSCOPY WITH RETROGRADE PYELOGRAM, URETEROSCOPY AND STENT PLACEMENT Left 02/01/2017   Procedure: 2ND STAGE CYSTOSCOPY WITH RETROGRADE PYELOGRAM, URETEROSCOPY AND STENT REPLACEMENT;  Surgeon: Alexis Frock, MD;  Location: [facility];  Service: Urology;  Laterality: Left;   ECTOPIC PREGNANCY SURGERY  1988   Left Salpingectomy   HOLMIUM LASER APPLICATION Left 10/17/4079   Procedure: HOLMIUM LASER APPLICATION;  Surgeon: Alexis Frock, MD;  Location: [facility];  Service: Urology;  Laterality: Left;   HOLMIUM LASER APPLICATION Left 10/04/8183   Procedure: HOLMIUM LASER APPLICATION;  Surgeon: Alexis Frock, MD;  Location: [facility];  Service: Urology;  Laterality: Left;   LUMBAR DISC SURGERY  01/1999   right L5 -- S1   RE-EXPLORATION LUMBAR/  LAMINECTOMY AND MICRODISKECTOMY  10/14/2001   right L5 -- S1   RIGHT HEART CATH N/A 05/23/2022   Procedure: RIGHT HEART CATH;  Surgeon: Jolaine Artist, MD;  Location: [place] [facility];  Service: Cardiovascular;  Laterality: N/A;   RIGHT/LEFT HEART CATH AND CORONARY ANGIOGRAPHY N/A 07/18/2021   Procedure: RIGHT/LEFT HEART CATH AND CORONARY ANGIOGRAPHY;  Surgeon: Jolaine Artist, MD;  Location: [place] [facility];  Service: Cardiovascular;  Laterality: N/A;   TUBAL LIGATION Bilateral 60/04/9322   PPTL   UMBILICAL HERNIA REPAIR  age 30   Patient Active Problem List   Diagnosis Date Noted   LBBB (left bundle branch block) 02/01/2022   Chronic combined systolic and diastolic heart failure ([place]) 07/27/2021   Acute combined systolic (congestive) and diastolic (congestive) heart failure (HCC)    Hypocalcemia 07/12/2021   Elevated troponin 07/12/2021    Suspected COVID-19 virus infection 06/11/2019   Acute respiratory failure with hypoxia ([place]) 06/11/2019   Multifocal pneumonia 06/11/2019   Ureteral stone with hydronephrosis 12/31/2016   Normocytic anemia 07/10/2016   Essential hypertension 07/26/2015   Eczema 07/26/2015   Overweight (BMI 25.0-29.9) 07/26/2015    REFERRING DIAG: right shoulder pain   THERAPY DIAG:  Chronic right shoulder pain  Muscle weakness  Rationale for Evaluation and Treatment Rehabilitation  PERTINENT HISTORY: Heart failure (does not tolerate reclined positions), history of heart attack   PRECAUTIONS: Heart failure (does not tolerate reclined positions)   SUBJECTIVE:                                                                                                                                                                                      SUBJECTIVE STATEMENT: Patient reports continued nagging pain in her R shoulder.   PAIN:  Are you having pain? 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                               SENSATION:          Light touch: Appears intact           PALPATION: Exquisite TTP R anterior shoulder and LH biceps tendon   UPPER EXTREMITY AROM:   ROM Right 07/11/2022 Left 07/11/2022  Shoulder flexion 65 135  Shoulder abduction 65 135  Shoulder internal rotation      Shoulder external rotation      Functional IR To greater troch T1  Functional ER To ear* T4*  Shoulder extension      Elbow extension      Elbow flexion        (  Blank rows = not tested, N = WNL,  * = concordant pain with testing)   UPPER EXTREMITY MMT:   MMT Right 07/11/2022 Left 07/11/2022  Shoulder flexion Unable d/t pain    Shoulder abduction (C5) Unable d/t pain    Shoulder ER 4 4  Shoulder IR 3* 4  Middle trapezius      Lower trapezius      Shoulder extension      Grip strength      Cervical flexion (C1,C2)      Cervical S/B (C3)      Shoulder shrug (C4)      Elbow flexion (C6) 3*    Elbow ext (C7)      Thumb ext (C8)      Finger abd (T1)      Grossly        (Blank rows = not tested, score listed is out of 5 possible points.  N = WNL, D = diminished, C = clear for gross weakness with myotome testing, * = concordant pain with testing)   UPPER EXTREMITY PROM:   PROM Right 07/11/2022 Left 07/11/2022  Shoulder flexion 90    Shoulder abduction 85    Shoulder internal rotation      Shoulder external rotation at 45 ~30 degrees    Functional IR      Functional ER      Shoulder extension      Elbow extension      Elbow flexion        (Blank rows = not tested, N = WNL, * = concordant pain with testing)   SHOULDER SPECIAL TESTS:           RC test cluster: + painful arc, - ER, - drop arm           Yergason's Test (+)     PATIENT SURVEYS:  Quick Dash 36/55              TODAY'S TREATMENT:  Creating, reviewing, and completing below HEP     PATIENT EDUCATION:  POC, diagnosis, prognosis, HEP, and outcome measures.  Pt educated via explanation, demonstration, and handout (HEP).  Pt confirms understanding verbally.    HOME EXERCISE PROGRAM: Access Code: [email or web address] URL: [email or web address] Date: 07/11/2022 Prepared by: Shearon Balo   Exercises - Seated Isometric Elbow Flexion  - 3 x daily - 7 x weekly - 3 sets - 10 reps - 3'' hold   TREATMENT 07/19/22: Therapeutic Exercise: - Elbow flexion 1# pronated/supinated x10, x5 each Rt (pain in anterior shoulder, and around elbow) - Pronation/supination 1# x10 - Hammer curl no weight x10, x8 -  Seated scapular retraction x10, x5 Manual Therapy (performed in reclined position on table, no supine): - GH joint mobs - PROM all directions - STM biceps Modalities: - MHP patient in reclined position in room, x10 min post session  ASSESSMENT:   CLINICAL IMPRESSION: Patient presents to first follow up PT session reporting continued pain in her R shoulder and states she has been compliant with her HEP, but that exercises does increase her pain. She states that she has an MRI scheduled for 08/05/22 to see if she has a tear. Session today focused on gentle ROM, strengthening, and joint mobilizations to decrease pain and maintain ROM. She is somewhat limited by pain throughout session, needing rest breaks and decreased repetitions on some exercises. She has some anxiety in crowded gym area, decreased with transition to treatment room. Patient continues to benefit from skilled PT services  and should be progressed as able to improve functional independence.     OBJECTIVE IMPAIRMENTS: Pain, shoulder ROM, shoulder strength   ACTIVITY LIMITATIONS: lifting, reaching, housework   PERSONAL FACTORS: See medical history and pertinent history     REHAB POTENTIAL: Fair chronic   CLINICAL DECISION MAKING: Stable/uncomplicated   EVALUATION COMPLEXITY: Low     GOALS:     SHORT TERM GOALS: Target date: 08/08/2022   Arienna will be >75% HEP compliant to improve carryover between sessions and facilitate independent management of condition   Evaluation (07/11/2022): ongoing Goal status: INITIAL     LONG TERM GOALS: Target date: 09/05/2022   [place] will show a >/= 15 pt improvement in her QUICK DASH score (MCID is 13.4 pts or 8%) as a proxy for functional improvement    Evaluation/Baseline (07/11/2022): 35/55 pts Goal status: INITIAL     2.  Srija will self report >/= 50% decrease in pain from evaluation    Evaluation/Baseline (07/11/2022): 10/10 max pain Goal status: INITIAL     3.   Danyia will demonstrate >120 degrees of active ROM in flexion to allow completion of activities involving reaching OH, not limited by pain   Evaluation/Baseline (07/11/2022): 65 degrees Goal status: INITIAL     4.  Suvi will be able to complete light to moderate housework, not limited by pain   Evaluation/Baseline (07/11/2022): limited Goal status: INITIAL     PLAN: PT FREQUENCY: 1-2x/week   PT DURATION: 8 weeks (Ending 09/05/2022)   PLANNED INTERVENTIONS: Therapeutic exercises, Aquatic therapy, Therapeutic activity, Neuro Muscular re-education, Gait training, Patient/Family education, Joint mobilization, Dry Needling, Electrical stimulation, Spinal mobilization and/or manipulation, Moist heat, Taping, Vasopneumatic device, Ionotophoresis '4mg'$ /ml Dexamethasone, and Manual therapy   PLAN FOR NEXT SESSION: MT for ROM, GH joint mobs, progressive biceps loading   Margarette Canada, PTA 07/19/2022, 10:39 AM

## 2022-07-19 ENCOUNTER — Ambulatory Visit: Payer: Medicaid Other

## 2022-07-19 DIAGNOSIS — G8929 Other chronic pain: Secondary | ICD-10-CM

## 2022-07-19 DIAGNOSIS — M25511 Pain in right shoulder: Secondary | ICD-10-CM | POA: Diagnosis not present

## 2022-07-19 DIAGNOSIS — M6281 Muscle weakness (generalized): Secondary | ICD-10-CM

## 2022-07-24 ENCOUNTER — Encounter: Payer: Self-pay | Admitting: Physical Therapy

## 2022-07-24 ENCOUNTER — Ambulatory Visit: Payer: Medicaid Other | Admitting: Physical Therapy

## 2022-07-24 DIAGNOSIS — M25511 Pain in right shoulder: Secondary | ICD-10-CM | POA: Diagnosis not present

## 2022-07-24 DIAGNOSIS — R600 Localized edema: Secondary | ICD-10-CM

## 2022-07-24 DIAGNOSIS — M6281 Muscle weakness (generalized): Secondary | ICD-10-CM

## 2022-07-24 DIAGNOSIS — G8929 Other chronic pain: Secondary | ICD-10-CM

## 2022-07-24 NOTE — Therapy (Signed)
OUTPATIENT PHYSICAL THERAPY TREATMENT NOTE   Patient Name: Lynn Estrada MRN: [id number] DOB:05/04/1961, 62 y.o., female Today's Date: 07/24/2022  PCP: [name], PA  REFERRING PROVIDER: Wendall Mola, PA-C   END OF SESSION:   PT End of Session - 07/24/22 0959     Visit Number 3    Date for PT Re-Evaluation 09/05/22    Authorization Type [employer] - Jeanie Cooks    PT Start Time 1000    PT Stop Time 1041    PT Time Calculation (min) 41 min    Activity Tolerance Patient tolerated treatment well;Patient limited by pain    Behavior During Therapy [facility] for tasks assessed/performed             Past Medical History:  Diagnosis Date   Anemia    Eczema    GERD (gastroesophageal reflux disease)    History of adenomatous polyp of colon    08-03-2016  tubular adenoma x2 and hyperplastic   History of chronic gastritis 08/03/2016   History of ectopic pregnancy 1988   s/p  left salpingectomy   History of endometriosis    History of kidney stones    History of partial nephrectomy    right pelvis for very large stone   History of pneumonia 07/02/2016   CAP   History of sepsis    07-01-2016  sepsis w/ pyelonephritis, CAP /   12-31-2016  urosepsis w/ kidney stone obstruction   History of uterine leiomyoma    Hypertension    Left ureteral stone    Nephrolithiasis    left obstructive stone and right non-obstructive stone  per CT 12-31-2016   Urgency of urination    Past Surgical History:  Procedure Laterality Date   ABDOMINAL HYSTERECTOMY  01/20/2007   w/ Lysis Adhesions/  Left salpingoophorectomy/  Right Salpingectomy   CHOLECYSTECTOMY  1994   and Right Partial Nephrectomy (pelvis for very large stone)   CYSTOSCOPY W/ URETERAL STENT PLACEMENT Left 12/31/2016   Procedure: CYSTOSCOPY WITH RETROGRADE PYELOGRAM/ LEFT URETERAL STENT PLACEMENT;  Surgeon: Alexis Frock, MD;  Location: [facility];  Service: Urology;  Laterality: Left;   CYSTOSCOPY WITH RETROGRADE PYELOGRAM,  URETEROSCOPY AND STENT PLACEMENT Left 01/18/2017   Procedure: 1ST STAGE CYSTOSCOPY WITH RETROGRADE PYELOGRAM, URETEROSCOPY AND STENT REPLACEMENT;  Surgeon: Alexis Frock, MD;  Location: [facility];  Service: Urology;  Laterality: Left;   CYSTOSCOPY WITH RETROGRADE PYELOGRAM, URETEROSCOPY AND STENT PLACEMENT Left 02/01/2017   Procedure: 2ND STAGE CYSTOSCOPY WITH RETROGRADE PYELOGRAM, URETEROSCOPY AND STENT REPLACEMENT;  Surgeon: Alexis Frock, MD;  Location: [facility];  Service: Urology;  Laterality: Left;   ECTOPIC PREGNANCY SURGERY  1988   Left Salpingectomy   HOLMIUM LASER APPLICATION Left 12/29/5282   Procedure: HOLMIUM LASER APPLICATION;  Surgeon: Alexis Frock, MD;  Location: [facility];  Service: Urology;  Laterality: Left;   HOLMIUM LASER APPLICATION Left 07/04/2438   Procedure: HOLMIUM LASER APPLICATION;  Surgeon: Alexis Frock, MD;  Location: [facility];  Service: Urology;  Laterality: Left;   LUMBAR DISC SURGERY  01/1999   right L5 -- S1   RE-EXPLORATION LUMBAR/  LAMINECTOMY AND MICRODISKECTOMY  10/14/2001   right L5 -- S1   RIGHT HEART CATH N/A 05/23/2022   Procedure: RIGHT HEART CATH;  Surgeon: Jolaine Artist, MD;  Location: [place] [facility];  Service: Cardiovascular;  Laterality: N/A;   RIGHT/LEFT HEART CATH AND CORONARY ANGIOGRAPHY N/A 07/18/2021   Procedure: RIGHT/LEFT HEART CATH AND CORONARY ANGIOGRAPHY;  Surgeon: Jolaine Artist, MD;  Location: [place] [facility];  Service: Cardiovascular;  Laterality: N/A;   TUBAL LIGATION Bilateral 65/78/4696   PPTL   UMBILICAL HERNIA REPAIR  age 24   Patient Active Problem List   Diagnosis Date Noted   LBBB (left bundle branch block) 02/01/2022   Chronic combined systolic and diastolic heart failure ([place]) 07/27/2021   Acute combined systolic (congestive) and diastolic (congestive) heart failure (HCC)    Hypocalcemia 07/12/2021   Elevated troponin 07/12/2021    Suspected COVID-19 virus infection 06/11/2019   Acute respiratory failure with hypoxia ([place]) 06/11/2019   Multifocal pneumonia 06/11/2019   Ureteral stone with hydronephrosis 12/31/2016   Normocytic anemia 07/10/2016   Essential hypertension 07/26/2015   Eczema 07/26/2015   Overweight (BMI 25.0-29.9) 07/26/2015    REFERRING DIAG: right shoulder pain   THERAPY DIAG:  Chronic right shoulder pain - Plan: PT plan of care cert/re-cert  Muscle weakness - Plan: PT plan of care cert/re-cert  Localized edema - Plan: PT plan of care cert/re-cert  Rationale for Evaluation and Treatment Rehabilitation  PERTINENT HISTORY: Heart failure (does not tolerate reclined positions), history of heart attack   PRECAUTIONS: Heart failure (does not tolerate reclined positions)   SUBJECTIVE:                                                                                                                                                                                      SUBJECTIVE STATEMENT: Pt reports that her pain is the same or maybe getting worse   PAIN:  Are you having pain? 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                               SENSATION:          Light touch: Appears intact           PALPATION: Exquisite TTP R anterior shoulder and LH biceps tendon   UPPER EXTREMITY AROM:   ROM Right 07/11/2022 Left 07/11/2022  Shoulder flexion 65 135  Shoulder abduction 65 135  Shoulder internal rotation      Shoulder external rotation      Functional IR To greater troch T1  Functional ER To ear* T4*  Shoulder extension      Elbow extension      Elbow flexion        (Blank rows = not tested, N = WNL, * = concordant pain with testing)   UPPER EXTREMITY MMT:   MMT Right 07/11/2022 Left 07/11/2022  Shoulder flexion Unable d/t pain    Shoulder abduction (C5) Unable d/t pain    Shoulder ER 4 4  Shoulder IR 3* 4  Middle trapezius      Lower trapezius      Shoulder extension      Grip strength      Cervical flexion (C1,C2)      Cervical S/B (C3)      Shoulder shrug (C4)      Elbow flexion (C6) 3*    Elbow ext (C7)      Thumb ext (C8)      Finger abd (T1)      Grossly        (Blank rows = not tested, score listed is out of 5 possible points.  N = WNL, D = diminished, C = clear for gross weakness with myotome testing, * = concordant pain with testing)   UPPER EXTREMITY PROM:   PROM Right 07/11/2022 Left 07/11/2022  Shoulder flexion 90    Shoulder abduction 85    Shoulder internal rotation      Shoulder external rotation at 45 ~30 degrees    Functional IR      Functional ER      Shoulder extension      Elbow extension      Elbow flexion        (Blank rows = not tested, N = WNL, * = concordant pain with testing)   SHOULDER SPECIAL TESTS:           RC test cluster: + painful arc, - ER, - drop arm           Yergason's Test (+)     PATIENT SURVEYS:  Quick Dash 36/55              TODAY'S TREATMENT:  Creating, reviewing, and completing below HEP     PATIENT EDUCATION:  POC, diagnosis, prognosis, HEP, and outcome measures.  Pt educated via explanation, demonstration, and handout (HEP).  Pt confirms understanding verbally.    HOME EXERCISE PROGRAM: Access Code: [email or web address] URL: [email or web address] Date: 07/11/2022 Prepared by: Shearon Balo   Exercises - Seated Isometric Elbow Flexion  - 3 x daily - 7 x weekly - 3 sets - 10 reps - 3'' hold   TREATMENT 07/24/22: Therapeutic Exercise: - Elbow flexion 3x10 -  Pronation/supination 3x10 - ER isometric - YTB - PT resistance - 3x10  Manual Therapy (performed in reclined position on table, no supine): - GH joint mobs - PROM all directions - STM biceps  Modalities:  Vasopneumatic (Game Ready)    Location:  right shoulder Time:  10 minutes Pressure:  medium Temperature:  44 degrees   ASSESSMENT:   CLINICAL IMPRESSION: Session today focused on gentle ROM, strengthening, and joint mobilizations to decrease pain and maintain ROM. She is significantly limited by pain throughout session.  Localized edema noted at [facility] of biceps tendon; vaso was trialed.  Pt reports pain relief following this.  Significant fatigue with ER isometric, so this was added to HEP.  Patient continues to benefit from skilled PT services and should be progressed as able to improve functional independence.  OBJECTIVE IMPAIRMENTS: Pain, shoulder ROM, shoulder strength   ACTIVITY LIMITATIONS: lifting, reaching, housework   PERSONAL FACTORS: See medical history and pertinent history     REHAB POTENTIAL: Fair chronic   CLINICAL DECISION MAKING: Stable/uncomplicated   EVALUATION COMPLEXITY: Low     GOALS:     SHORT TERM GOALS: Target date: 08/08/2022   Jaylina will be >75% HEP compliant to improve carryover between sessions and facilitate independent management of condition   Evaluation (07/11/2022): ongoing Goal status: INITIAL     LONG TERM GOALS: Target date: 09/05/2022   [place] will show a >/= 15 pt improvement in her QUICK DASH score (MCID is 13.4 pts or 8%) as a proxy for functional improvement    Evaluation/Baseline (07/11/2022): 35/55 pts Goal status: INITIAL     2.  Adely will self report >/= 50% decrease in pain from evaluation    Evaluation/Baseline (07/11/2022): 10/10 max pain Goal status: INITIAL     3.  Taleigha will demonstrate >120 degrees of active ROM in flexion to allow completion of activities involving reaching OH, not limited by  pain   Evaluation/Baseline (07/11/2022): 65 degrees Goal status: INITIAL     4.  Antia will be able to complete light to moderate housework, not limited by pain   Evaluation/Baseline (07/11/2022): limited Goal status: INITIAL     PLAN: PT FREQUENCY: 1-2x/week   PT DURATION: 8 weeks (Ending 09/05/2022)   PLANNED INTERVENTIONS: Therapeutic exercises, Aquatic therapy, Therapeutic activity, Neuro Muscular re-education, Gait training, Patient/Family education, Joint mobilization, Dry Needling, Electrical stimulation, Spinal mobilization and/or manipulation, Moist heat, Taping, Vasopneumatic device, Ionotophoresis '4mg'$ /ml Dexamethasone, and Manual therapy   PLAN FOR NEXT SESSION: MT for ROM, GH joint mobs, progressive biceps loading   Mathis Dad, PT 07/24/2022, 10:42 AM

## 2022-07-26 ENCOUNTER — Encounter: Payer: Self-pay | Admitting: Physical Therapy

## 2022-07-26 ENCOUNTER — Ambulatory Visit: Payer: Medicaid Other | Admitting: Physical Therapy

## 2022-07-26 DIAGNOSIS — G8929 Other chronic pain: Secondary | ICD-10-CM

## 2022-07-26 DIAGNOSIS — M6281 Muscle weakness (generalized): Secondary | ICD-10-CM

## 2022-07-26 DIAGNOSIS — R600 Localized edema: Secondary | ICD-10-CM

## 2022-07-26 DIAGNOSIS — M25511 Pain in right shoulder: Secondary | ICD-10-CM | POA: Diagnosis not present

## 2022-07-26 NOTE — Therapy (Signed)
OUTPATIENT PHYSICAL THERAPY TREATMENT NOTE   Patient Name: Lynn Estrada MRN: [id number] DOB:1961/05/10, 62 y.o., female Today's Date: 07/26/2022  PCP: [name], PA  REFERRING PROVIDER: Wendall Mola, PA-C   END OF SESSION:   PT End of Session - 07/26/22 1001     Visit Number 4    Date for PT Re-Evaluation 09/05/22    Authorization Type [employer] - Jeanie Cooks    PT Start Time 1000    PT Stop Time 7858    PT Time Calculation (min) 40 min    Activity Tolerance Patient tolerated treatment well;Patient limited by pain    Behavior During Therapy [facility] for tasks assessed/performed             Past Medical History:  Diagnosis Date   Anemia    Eczema    GERD (gastroesophageal reflux disease)    History of adenomatous polyp of colon    08-03-2016  tubular adenoma x2 and hyperplastic   History of chronic gastritis 08/03/2016   History of ectopic pregnancy 1988   s/p  left salpingectomy   History of endometriosis    History of kidney stones    History of partial nephrectomy    right pelvis for very large stone   History of pneumonia 07/02/2016   CAP   History of sepsis    07-01-2016  sepsis w/ pyelonephritis, CAP /   12-31-2016  urosepsis w/ kidney stone obstruction   History of uterine leiomyoma    Hypertension    Left ureteral stone    Nephrolithiasis    left obstructive stone and right non-obstructive stone  per CT 12-31-2016   Urgency of urination    Past Surgical History:  Procedure Laterality Date   ABDOMINAL HYSTERECTOMY  01/20/2007   w/ Lysis Adhesions/  Left salpingoophorectomy/  Right Salpingectomy   CHOLECYSTECTOMY  1994   and Right Partial Nephrectomy (pelvis for very large stone)   CYSTOSCOPY W/ URETERAL STENT PLACEMENT Left 12/31/2016   Procedure: CYSTOSCOPY WITH RETROGRADE PYELOGRAM/ LEFT URETERAL STENT PLACEMENT;  Surgeon: Alexis Frock, MD;  Location: [facility];  Service: Urology;  Laterality: Left;   CYSTOSCOPY WITH RETROGRADE PYELOGRAM,  URETEROSCOPY AND STENT PLACEMENT Left 01/18/2017   Procedure: 1ST STAGE CYSTOSCOPY WITH RETROGRADE PYELOGRAM, URETEROSCOPY AND STENT REPLACEMENT;  Surgeon: Alexis Frock, MD;  Location: [facility];  Service: Urology;  Laterality: Left;   CYSTOSCOPY WITH RETROGRADE PYELOGRAM, URETEROSCOPY AND STENT PLACEMENT Left 02/01/2017   Procedure: 2ND STAGE CYSTOSCOPY WITH RETROGRADE PYELOGRAM, URETEROSCOPY AND STENT REPLACEMENT;  Surgeon: Alexis Frock, MD;  Location: [facility];  Service: Urology;  Laterality: Left;   ECTOPIC PREGNANCY SURGERY  1988   Left Salpingectomy   HOLMIUM LASER APPLICATION Left 8/50/2774   Procedure: HOLMIUM LASER APPLICATION;  Surgeon: Alexis Frock, MD;  Location: [facility];  Service: Urology;  Laterality: Left;   HOLMIUM LASER APPLICATION Left 07/03/8784   Procedure: HOLMIUM LASER APPLICATION;  Surgeon: Alexis Frock, MD;  Location: [facility];  Service: Urology;  Laterality: Left;   LUMBAR DISC SURGERY  01/1999   right L5 -- S1   RE-EXPLORATION LUMBAR/  LAMINECTOMY AND MICRODISKECTOMY  10/14/2001   right L5 -- S1   RIGHT HEART CATH N/A 05/23/2022   Procedure: RIGHT HEART CATH;  Surgeon: Jolaine Artist, MD;  Location: [facility];  Service: Cardiovascular;  Laterality: N/A;   RIGHT/LEFT HEART CATH AND CORONARY ANGIOGRAPHY N/A 07/18/2021   Procedure: RIGHT/LEFT HEART CATH AND CORONARY ANGIOGRAPHY;  Surgeon: Jolaine Artist, MD;  Location: [facility];  Service: Cardiovascular;  Laterality: N/A;   TUBAL LIGATION Bilateral 45/40/9811   PPTL   UMBILICAL HERNIA REPAIR  age 52   Patient Active Problem List   Diagnosis Date Noted   LBBB (left bundle branch block) 02/01/2022   Chronic combined systolic and diastolic heart failure ([place]) 07/27/2021   Acute combined systolic (congestive) and diastolic (congestive) heart failure (HCC)    Hypocalcemia 07/12/2021   Elevated troponin 07/12/2021    Suspected COVID-19 virus infection 06/11/2019   Acute respiratory failure with hypoxia ([place]) 06/11/2019   Multifocal pneumonia 06/11/2019   Ureteral stone with hydronephrosis 12/31/2016   Normocytic anemia 07/10/2016   Essential hypertension 07/26/2015   Eczema 07/26/2015   Overweight (BMI 25.0-29.9) 07/26/2015    REFERRING DIAG: right shoulder pain   THERAPY DIAG:  Chronic right shoulder pain  Muscle weakness  Localized edema  Rationale for Evaluation and Treatment Rehabilitation  PERTINENT HISTORY: Heart failure (does not tolerate reclined positions), history of heart attack   PRECAUTIONS: Heart failure (does not tolerate reclined positions)   SUBJECTIVE:                                                                                                                                                                                      SUBJECTIVE STATEMENT: Pt reports that her pain is about a 6/10 today.  She feels that the HEP is somewhat helpful.   PAIN:  Are you having pain? 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                               SENSATION:          Light touch: Appears intact           PALPATION: Exquisite TTP R anterior shoulder and LH biceps tendon   UPPER EXTREMITY AROM:   ROM Right 07/11/2022 Left 07/11/2022  Shoulder flexion 65 135  Shoulder abduction 65 135  Shoulder internal rotation      Shoulder external rotation      Functional IR To greater troch T1  Functional ER To ear* T4*  Shoulder extension      Elbow extension  Elbow flexion        (Blank rows = not tested, N = WNL, * = concordant pain with testing)   UPPER EXTREMITY MMT:   MMT Right 07/11/2022 Left 07/11/2022  Shoulder flexion Unable d/t pain    Shoulder abduction (C5) Unable d/t pain    Shoulder ER 4 4  Shoulder IR 3* 4  Middle trapezius      Lower trapezius      Shoulder extension      Grip strength      Cervical flexion (C1,C2)      Cervical S/B (C3)      Shoulder shrug (C4)      Elbow flexion (C6) 3*    Elbow ext (C7)      Thumb ext (C8)      Finger abd (T1)      Grossly        (Blank rows = not tested, score listed is out of 5 possible points.  N = WNL, D = diminished, C = clear for gross weakness with myotome testing, * = concordant pain with testing)   UPPER EXTREMITY PROM:   PROM Right 07/11/2022 Left 07/11/2022  Shoulder flexion 90    Shoulder abduction 85    Shoulder internal rotation      Shoulder external rotation at 45 ~30 degrees    Functional IR      Functional ER      Shoulder extension      Elbow extension      Elbow flexion        (Blank rows = not tested, N = WNL, * = concordant pain with testing)   SHOULDER SPECIAL TESTS:           RC test cluster: + painful arc, - ER, - drop arm           Yergason's Test (+)     PATIENT SURVEYS:  Quick Dash 36/55              TODAY'S TREATMENT:  Creating, reviewing, and completing below HEP     PATIENT EDUCATION:  POC, diagnosis, prognosis, HEP, and outcome measures.  Pt educated via explanation, demonstration, and handout (HEP).  Pt confirms understanding verbally.    HOME EXERCISE PROGRAM: Access Code: [email or web address] URL: [email or web address] Date: 07/11/2022 Prepared by: Shearon Balo   Exercises - Seated Isometric Elbow Flexion  - 3 x daily - 7 x weekly - 3 sets - 10 reps - 3'' hold   TREATMENT 07/26/22: Therapeutic Exercise: - Elbow - flexion isometric - YTB - PT resistance - 3x10 - ER isometric - YTB - PT resistance -  3x10  Manual Therapy (performed in reclined position on table, no supine): - GH joint mobs - PROM all directions - STM biceps  Modalities:  Vasopneumatic (Game Ready)    Location:  right shoulder Time:  10 minutes Pressure:  medium Temperature:  44 degrees   ASSESSMENT:   CLINICAL IMPRESSION: Pt presents with slightly lower pain today.  At this point her sxs are looking more consistent with adhesive capsulitis as she is increasingly limited in ER>abd>IR.  Responded well to gentle range and game ready today.    OBJECTIVE IMPAIRMENTS: Pain, shoulder ROM, shoulder strength   ACTIVITY LIMITATIONS: lifting, reaching, housework   PERSONAL FACTORS: See medical history and pertinent history     REHAB POTENTIAL: Fair chronic   CLINICAL DECISION MAKING: Stable/uncomplicated   EVALUATION COMPLEXITY: Low     GOALS:     SHORT  TERM GOALS: Target date: 08/08/2022   Dustina will be >75% HEP compliant to improve carryover between sessions and facilitate independent management of condition   Evaluation (07/11/2022): ongoing Goal status: INITIAL     LONG TERM GOALS: Target date: 09/05/2022   [place] will show a >/= 15 pt improvement in her QUICK DASH score (MCID is 13.4 pts or 8%) as a proxy for functional improvement    Evaluation/Baseline (07/11/2022): 35/55 pts Goal status: INITIAL     2.  Tonie will self report >/= 50% decrease in pain from evaluation    Evaluation/Baseline (07/11/2022): 10/10 max pain Goal status: INITIAL     3.  [place] will demonstrate >120 degrees of active ROM in flexion to allow completion of activities involving reaching OH, not limited by pain   Evaluation/Baseline (07/11/2022): 65 degrees Goal status: INITIAL     4.  Tanara will be able to complete light to moderate housework, not limited by pain   Evaluation/Baseline (07/11/2022): limited Goal status: INITIAL     PLAN: PT FREQUENCY: 1-2x/week   PT DURATION: 8 weeks (Ending  09/05/2022)   PLANNED INTERVENTIONS: Therapeutic exercises, Aquatic therapy, Therapeutic activity, Neuro Muscular re-education, Gait training, Patient/Family education, Joint mobilization, Dry Needling, Electrical stimulation, Spinal mobilization and/or manipulation, Moist heat, Taping, Vasopneumatic device, Ionotophoresis '4mg'$ /ml Dexamethasone, and Manual therapy   PLAN FOR NEXT SESSION: MT for ROM, GH joint mobs, progressive biceps loading   Mathis Dad, PT 07/26/2022, 10:45 AM

## 2022-07-31 ENCOUNTER — Encounter: Payer: Self-pay | Admitting: Physical Therapy

## 2022-07-31 ENCOUNTER — Ambulatory Visit: Payer: Medicaid Other | Admitting: Physical Therapy

## 2022-07-31 DIAGNOSIS — G8929 Other chronic pain: Secondary | ICD-10-CM

## 2022-07-31 DIAGNOSIS — R600 Localized edema: Secondary | ICD-10-CM

## 2022-07-31 DIAGNOSIS — M6281 Muscle weakness (generalized): Secondary | ICD-10-CM

## 2022-07-31 DIAGNOSIS — M25511 Pain in right shoulder: Secondary | ICD-10-CM | POA: Diagnosis not present

## 2022-07-31 NOTE — Therapy (Signed)
OUTPATIENT PHYSICAL THERAPY TREATMENT NOTE   Patient Name: Lynn Estrada MRN: [id number] DOB:04-Oct-1960, 62 y.o., female Today's Date: 07/31/2022  PCP: [name], PA  REFERRING PROVIDER: Wendall Mola, PA-C   END OF SESSION:   PT End of Session - 07/31/22 0958     Visit Number 5    Date for PT Re-Evaluation 09/05/22    Authorization Type [employer] - Jeanie Cooks    PT Start Time 1000    PT Stop Time 6387    PT Time Calculation (min) 40 min    Activity Tolerance Patient tolerated treatment well;Patient limited by pain    Behavior During Therapy [facility] for tasks assessed/performed             Past Medical History:  Diagnosis Date   Anemia    Eczema    GERD (gastroesophageal reflux disease)    History of adenomatous polyp of colon    08-03-2016  tubular adenoma x2 and hyperplastic   History of chronic gastritis 08/03/2016   History of ectopic pregnancy 1988   s/p  left salpingectomy   History of endometriosis    History of kidney stones    History of partial nephrectomy    right pelvis for very large stone   History of pneumonia 07/02/2016   CAP   History of sepsis    07-01-2016  sepsis w/ pyelonephritis, CAP /   12-31-2016  urosepsis w/ kidney stone obstruction   History of uterine leiomyoma    Hypertension    Left ureteral stone    Nephrolithiasis    left obstructive stone and right non-obstructive stone  per CT 12-31-2016   Urgency of urination    Past Surgical History:  Procedure Laterality Date   ABDOMINAL HYSTERECTOMY  01/20/2007   w/ Lysis Adhesions/  Left salpingoophorectomy/  Right Salpingectomy   CHOLECYSTECTOMY  1994   and Right Partial Nephrectomy (pelvis for very large stone)   CYSTOSCOPY W/ URETERAL STENT PLACEMENT Left 12/31/2016   Procedure: CYSTOSCOPY WITH RETROGRADE PYELOGRAM/ LEFT URETERAL STENT PLACEMENT;  Surgeon: Alexis Frock, MD;  Location: [facility];  Service: Urology;  Laterality: Left;   CYSTOSCOPY WITH RETROGRADE PYELOGRAM,  URETEROSCOPY AND STENT PLACEMENT Left 01/18/2017   Procedure: 1ST STAGE CYSTOSCOPY WITH RETROGRADE PYELOGRAM, URETEROSCOPY AND STENT REPLACEMENT;  Surgeon: Alexis Frock, MD;  Location: [facility];  Service: Urology;  Laterality: Left;   CYSTOSCOPY WITH RETROGRADE PYELOGRAM, URETEROSCOPY AND STENT PLACEMENT Left 02/01/2017   Procedure: 2ND STAGE CYSTOSCOPY WITH RETROGRADE PYELOGRAM, URETEROSCOPY AND STENT REPLACEMENT;  Surgeon: Alexis Frock, MD;  Location: [facility];  Service: Urology;  Laterality: Left;   ECTOPIC PREGNANCY SURGERY  1988   Left Salpingectomy   HOLMIUM LASER APPLICATION Left 5/64/3329   Procedure: HOLMIUM LASER APPLICATION;  Surgeon: Alexis Frock, MD;  Location: [facility];  Service: Urology;  Laterality: Left;   HOLMIUM LASER APPLICATION Left 10/31/8839   Procedure: HOLMIUM LASER APPLICATION;  Surgeon: Alexis Frock, MD;  Location: [facility];  Service: Urology;  Laterality: Left;   LUMBAR DISC SURGERY  01/1999   right L5 -- S1   RE-EXPLORATION LUMBAR/  LAMINECTOMY AND MICRODISKECTOMY  10/14/2001   right L5 -- S1   RIGHT HEART CATH N/A 05/23/2022   Procedure: RIGHT HEART CATH;  Surgeon: Jolaine Artist, MD;  Location: [facility];  Service: Cardiovascular;  Laterality: N/A;   RIGHT/LEFT HEART CATH AND CORONARY ANGIOGRAPHY N/A 07/18/2021   Procedure: RIGHT/LEFT HEART CATH AND CORONARY ANGIOGRAPHY;  Surgeon: Jolaine Artist, MD;  Location: [facility];  Service: Cardiovascular;  Laterality: N/A;   TUBAL LIGATION Bilateral 32/95/1884   PPTL   UMBILICAL HERNIA REPAIR  age 28   Patient Active Problem List   Diagnosis Date Noted   LBBB (left bundle branch block) 02/01/2022   Chronic combined systolic and diastolic heart failure ([place]) 07/27/2021   Acute combined systolic (congestive) and diastolic (congestive) heart failure (HCC)    Hypocalcemia 07/12/2021   Elevated troponin 07/12/2021    Suspected COVID-19 virus infection 06/11/2019   Acute respiratory failure with hypoxia ([place]) 06/11/2019   Multifocal pneumonia 06/11/2019   Ureteral stone with hydronephrosis 12/31/2016   Normocytic anemia 07/10/2016   Essential hypertension 07/26/2015   Eczema 07/26/2015   Overweight (BMI 25.0-29.9) 07/26/2015    REFERRING DIAG: right shoulder pain   THERAPY DIAG:  Chronic right shoulder pain  Muscle weakness  Localized edema  Rationale for Evaluation and Treatment Rehabilitation  PERTINENT HISTORY: Heart failure (does not tolerate reclined positions), history of heart attack   PRECAUTIONS: Heart failure (does not tolerate reclined positions); Adhesive capsulitis (?)  SUBJECTIVE:                                                                                                                                                                                      SUBJECTIVE STATEMENT: Pt reports that her shoulder is doing well today.  She rates her pain at 4/10 currently.   PAIN:  Are you having pain? 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                               SENSATION:          Light touch: Appears intact           PALPATION: Exquisite TTP R anterior shoulder and LH biceps tendon   UPPER EXTREMITY AROM:   ROM Right 07/11/2022 Left 07/11/2022  Shoulder flexion 65 135  Shoulder abduction 65 135  Shoulder internal rotation      Shoulder external rotation      Functional IR To greater troch T1  Functional ER To ear* T4*  Shoulder extension      Elbow  extension      Elbow flexion        (Blank rows = not tested, N = WNL, * = concordant pain with testing)   UPPER EXTREMITY MMT:   MMT Right 07/11/2022 Left 07/11/2022  Shoulder flexion Unable d/t pain    Shoulder abduction (C5) Unable d/t pain    Shoulder ER 4 4  Shoulder IR 3* 4  Middle trapezius      Lower trapezius      Shoulder extension      Grip strength      Cervical flexion (C1,C2)      Cervical S/B (C3)      Shoulder shrug (C4)      Elbow flexion (C6) 3*    Elbow ext (C7)      Thumb ext (C8)      Finger abd (T1)      Grossly        (Blank rows = not tested, score listed is out of 5 possible points.  N = WNL, D = diminished, C = clear for gross weakness with myotome testing, * = concordant pain with testing)   UPPER EXTREMITY PROM:   PROM Right 07/11/2022 Left 07/11/2022  Shoulder flexion 90    Shoulder abduction 85    Shoulder internal rotation      Shoulder external rotation at 45 ~30 degrees    Functional IR      Functional ER      Shoulder extension      Elbow extension      Elbow flexion        (Blank rows = not tested, N = WNL, * = concordant pain with testing)   SHOULDER SPECIAL TESTS:           RC test cluster: + painful arc, - ER, - drop arm           Yergason's Test (+)     PATIENT SURVEYS:  Quick Dash 36/55              TODAY'S TREATMENT:  Creating, reviewing, and completing below HEP     PATIENT EDUCATION:  POC, diagnosis, prognosis, HEP, and outcome measures.  Pt educated via explanation, demonstration, and handout (HEP).  Pt confirms understanding verbally.    HOME EXERCISE PROGRAM: Access Code: [email or web address] URL: [email or web address] Date: 07/11/2022 Prepared by: Shearon Balo   Exercises - Seated Isometric Elbow Flexion  - 3 x daily - 7 x weekly - 3 sets - 10 reps - 3'' hold   DAILY TREATMENT: Therapeutic Exercise: - UBE - 3'/3' - L 1.5' - Elbow - flexion isometric - RTB - PT resistance - 3x10 - ER isometric -  RTB - PT resistance - 3x10  Manual Therapy (performed in reclined position on table, no supine): - GH joint mobs - PROM all directions   Modalities:  Vasopneumatic (Game Ready)    Location:  right shoulder Time:  10 minutes Pressure:  medium Temperature:  44 degrees   ASSESSMENT:   CLINICAL IMPRESSION: Pt presents with slightly lower pain today.  PROM was initially very painful.  This improved significantly following GH joint mobilizations.  This further raises suspicion for [facility].  Continue per POC.  Pt has improved AROM compared to EVAL.      OBJECTIVE IMPAIRMENTS: Pain, shoulder ROM, shoulder strength   ACTIVITY LIMITATIONS: lifting, reaching, housework   PERSONAL FACTORS: See medical history and pertinent history     REHAB POTENTIAL: Fair chronic   CLINICAL DECISION MAKING:  Stable/uncomplicated   EVALUATION COMPLEXITY: Low     GOALS:     SHORT TERM GOALS: Target date: 08/08/2022   [place] will be >75% HEP compliant to improve carryover between sessions and facilitate independent management of condition   Evaluation (07/11/2022): ongoing Goal status: MET 1/30     LONG TERM GOALS: Target date: 09/05/2022   [place] will show a >/= 15 pt improvement in her QUICK DASH score (MCID is 13.4 pts or 8%) as a proxy for functional improvement    Evaluation/Baseline (07/11/2022): 35/55 pts Goal status: INITIAL     2.  Kameshia will self report >/= 50% decrease in pain from evaluation    Evaluation/Baseline (07/11/2022): 10/10 max pain 1/30: 10/10 max, 4/10 min Goal status: ongoing     3.  Rhealyn will demonstrate >120 degrees of active ROM in flexion to allow completion of activities involving reaching OH, not limited by pain   Evaluation/Baseline (07/11/2022): 65 degrees passive, unable active 1/30: 60 AROM w/ pain Goal status: INITIAL     4.  Svara will be able to complete light to moderate housework, not limited by pain   Evaluation/Baseline (07/11/2022):  limited 1/30: able to do light housework (wash dishes), but no moderate housework Goal status: ongoing     PLAN: PT FREQUENCY: 1-2x/week   PT DURATION: 8 weeks (Ending 09/05/2022)   PLANNED INTERVENTIONS: Therapeutic exercises, Aquatic therapy, Therapeutic activity, Neuro Muscular re-education, Gait training, Patient/Family education, Joint mobilization, Dry Needling, Electrical stimulation, Spinal mobilization and/or manipulation, Moist heat, Taping, Vasopneumatic device, Ionotophoresis '4mg'$ /ml Dexamethasone, and Manual therapy   PLAN FOR NEXT SESSION: MT for ROM, GH joint mobs, progressive biceps loading   Mathis Dad, PT 07/31/2022, 10:43 AM

## 2022-07-31 NOTE — Progress Notes (Signed)
PCP: Wynne Dust, [facility] Primary Cardiologist: Dr Terrence Dupont HF MD: Dr Haroldine Laws   HPI: Lynn Estrada is a 62 y.o.with a history of HTN, GERD, nephrolithiasis, partial right nephrectomy, LBBB, and combined diastolic/systolic HF.      Admitted 1/23 with acute HFrEF. 07/13/21 Echo 20-25%, RV normal, grade II DD , and septal - lateral dyssnchrony. R/L cath w/ Minimal CAD, mildly elevated filling pressures and normal CO. cMRI ? Myocarditis (see impression below). LVEF 18%. Placed on Jardiance and spiro. Not on bb with acute decompensation. No ARNi with hypotension. Discharged on 07/20/21.   Echo 10/25/21 EF 30-35%  RHC 11/23 RA = 6 RV = 30/10  PA = 35/21 (29) PCW = 20  Fick cardiac output/index = 4.8/2.3 Thermo CO/CI = 5.2/2.4 PVR = 1.9 WU FA sat = 96% PA sat = 62%  CPX 12/23 - submax test. Non diagnostic.   Time 2:45 FVC 2.14 (58% predicted)      FEV1 1.82 (64% predicted)        FEV1/FVC 85%        MVV 65 (59% predicted)   BP rest: 100/74 BP standing: 94/72 BP peak: 92/70  Peak VO2: 8.4 (40.9% predicted peak VO2)  VE/VCO2 slope:  33 Peak RER: 0.85   She is here today for HF follow-up. Since we la     ROS: All systems negative except as listed in HPI, PMH and Problem List.  SH:  Social History   Socioeconomic History   Marital status: Married    Spouse name: Richard Oquendo   Number of children: 1   Years of education: college   Highest education level: Not on file  Occupational History   Occupation: Higher education careers adviser  Tobacco Use   Smoking status: Former    Packs/day: 0.75    Years: 5.00    Total pack years: 3.75    Types: Cigarettes    Quit date: 01/10/1997    Years since quitting: 25.5   Smokeless tobacco: Never  Vaping Use   Vaping Use: Never used  Substance and Sexual Activity   Alcohol use: Yes    Alcohol/week: 0.0 standard drinks of alcohol    Comment: social use; once a month   Drug use: No   Sexual activity: Yes    Partners: Male     Birth control/protection: Surgical  Other Topics Concern   Not on file  Social History Narrative   Lives with her husband and their daughter and their dog.   Social Determinants of Health   Financial Resource Strain: Medium Risk (07/27/2021)   Overall Financial Resource Strain (CARDIA)    Difficulty of Paying Living Expenses: Somewhat hard  Food Insecurity: Food Insecurity Present (07/27/2021)   Hunger Vital Sign    Worried About Running Out of Food in the Last Year: Sometimes true    Ran Out of Food in the Last Year: Never true  Transportation Needs: No Transportation Needs (07/20/2021)   PRAPARE - Hydrologist (Medical): No    Lack of Transportation (Non-Medical): No  Physical Activity: Not on file  Stress: Not on file  Social Connections: Not on file  Intimate Partner Violence: Not on file   FH:  Family History  Problem Relation Age of Onset   Hypertension Sister    Eczema Sister    Lupus Sister    Sarcoidosis Mother    Prostate cancer Father    Gout Maternal Grandmother    Diabetes Maternal Grandmother  leg amputations   Breast cancer Maternal Aunt    Stomach cancer Neg Hx    Colon cancer Neg Hx    Past Medical History:  Diagnosis Date   Anemia    Eczema    GERD (gastroesophageal reflux disease)    History of adenomatous polyp of colon    08-03-2016  tubular adenoma x2 and hyperplastic   History of chronic gastritis 08/03/2016   History of ectopic pregnancy 1988   s/p  left salpingectomy   History of endometriosis    History of kidney stones    History of partial nephrectomy    right pelvis for very large stone   History of pneumonia 07/02/2016   CAP   History of sepsis    07-01-2016  sepsis w/ pyelonephritis, CAP /   12-31-2016  urosepsis w/ kidney stone obstruction   History of uterine leiomyoma    Hypertension    Left ureteral stone    Nephrolithiasis    left obstructive stone and right non-obstructive stone  per  CT 12-31-2016   Urgency of urination    Current Outpatient Medications  Medication Sig Dispense Refill   acetaminophen (TYLENOL) 500 MG tablet Take 500-1,000 mg by mouth every 6 (six) hours as needed (for pain.).     amoxicillin (AMOXIL) 500 MG capsule Take 1 capsule (500 mg total) by mouth 3 (three) times daily. 21 capsule 0   aspirin 81 MG EC tablet Take 81 mg by mouth daily with lunch.     atorvastatin (LIPITOR) 10 MG tablet Take 10 mg by mouth daily.     cetirizine (ZYRTEC ALLERGY) 10 MG tablet Take 1 tablet (10 mg total) by mouth daily. (Patient not taking: Reported on 05/22/2022) 5 tablet 0   digoxin (LANOXIN) 0.125 MG tablet Take 1 tablet (125 mcg total) by mouth daily. 90 tablet 3   empagliflozin (JARDIANCE) 10 MG TABS tablet Take 1 tablet (10 mg total) by mouth daily. 30 tablet 2   furosemide (LASIX) 40 MG tablet Take 1 tablet (40 mg total) by mouth daily as needed. (Patient taking differently: Take 40 mg by mouth daily.) 30 tablet 5   HYDROcodone-acetaminophen (NORCO) 10-325 MG tablet Take 0.5 tablets by mouth every 6 (six) hours as needed for pain.     meclizine (ANTIVERT) 25 MG tablet Take 25 mg by mouth 3 (three) times daily as needed for dizziness or nausea.     Multiple Vitamin (MULTIVITAMIN WITH MINERALS) TABS tablet Take 1 tablet by mouth daily with lunch. One A Day for Women 50+     sacubitril-valsartan (ENTRESTO) 24-26 MG Take 1 tablet by mouth 2 (two) times daily. 60 tablet 11   spironolactone (ALDACTONE) 25 MG tablet TAKE 1 TABLET (25 MG TOTAL) BY MOUTH DAILY. 90 tablet 1   traMADol (ULTRAM) 50 MG tablet Take 50 mg by mouth every 6 (six) hours as needed for moderate pain or severe pain.     No current facility-administered medications for this encounter.   There were no vitals taken for this visit.  Wt Readings from Last 3 Encounters:  05/23/22 92.1 kg (203 lb)  05/21/22 93.3 kg (205 lb 9.6 oz)  05/14/22 92.1 kg (203 lb)   PHYSICAL EXAM: General:  Weak appearing. No  resp difficulty HEENT: normal Neck: supple. no JVD. Carotids 2+ bilat; no bruits. No lymphadenopathy or thryomegaly appreciated. Cor: PMI nondisplaced. Regular rate & rhythm. No rubs, gallops or murmurs. Lungs: clear Abdomen: soft, nontender, nondistended. No hepatosplenomegaly. No bruits or masses. Good  bowel sounds. Extremities: no cyanosis, clubbing, rash, edema Neuro: alert & orientedx3, cranial nerves grossly intact. moves all 4 extremities w/o difficulty. Affect pleasant   ECG: NSR 82 LBBB 155m Personally reviewed   ASSESSMENT & PLAN:  Chronic Combined Systolic/Diastolic HF, NICM.  - 07/13/21 Echo 20-25%, RV normal, grade II DD , and septal -lateral dyssnchrony - 07/2021 cMRI possible myocarditis  LVEF 18% RVEF 33% - [facility] min CAD. RA 6 PCWP 21, CO 6.8/ CI 3.1 - LBBB, QRS previously < 150 Lynn. QRS 158 Lynn on ECG today.  - Echo(4/23): EF 30-35% -> EF improving but still with significant LV dysfunction (suspect resolving myocarditis) - Echo 10/23 EF < 20% RV ok  - NYHA IV  - Continue Entresto 24/26 mg bid.  - Continue digoxin 0.125 mg daily.  - Continue spironolactone 25 mg daily  - Continue Jardiance 10 mg daily.  - Stop Toprol Toprol XL 25 mg daily - LVEF severely reduced. I am concerned for possible genetic DCM and need for advanced therapies. We discussed possible admission today for RHC but she is ok to wait until Wednesday as an outpatient. Pending results may also need CPX.   2. HTN - BP low   3. LBBB - QRS 1597mbut no significant dyssynchrony on echo  - Doubt CRT will be sufficient to help   4. Partial R Nephrectomy - Labs today  5. Snoring/daytime fatigue - Deferred sleep study  DaGlori BickersD 9:51 AM

## 2022-08-01 ENCOUNTER — Ambulatory Visit (HOSPITAL_COMMUNITY)
Admission: RE | Admit: 2022-08-01 | Discharge: 2022-08-01 | Disposition: A | Discharge status: Home / Self Care | Payer: Medicaid Other | Source: Ambulatory Visit | Attending: Internal Medicine | Admitting: Internal Medicine

## 2022-08-01 ENCOUNTER — Encounter (HOSPITAL_COMMUNITY): Payer: Self-pay | Admitting: Internal Medicine

## 2022-08-01 VITALS — BP 94/58 | HR 85 | Wt 207.8 lb

## 2022-08-01 DIAGNOSIS — I5022 Chronic systolic (congestive) heart failure: Secondary | ICD-10-CM | POA: Diagnosis not present

## 2022-08-01 DIAGNOSIS — M79602 Pain in left arm: Secondary | ICD-10-CM | POA: Insufficient documentation

## 2022-08-01 DIAGNOSIS — I428 Other cardiomyopathies: Secondary | ICD-10-CM | POA: Insufficient documentation

## 2022-08-01 DIAGNOSIS — I447 Left bundle-branch block, unspecified: Secondary | ICD-10-CM | POA: Insufficient documentation

## 2022-08-01 DIAGNOSIS — I2510 Atherosclerotic heart disease of native coronary artery without angina pectoris: Secondary | ICD-10-CM | POA: Insufficient documentation

## 2022-08-01 DIAGNOSIS — I5042 Chronic combined systolic (congestive) and diastolic (congestive) heart failure: Secondary | ICD-10-CM | POA: Insufficient documentation

## 2022-08-01 DIAGNOSIS — I110 Hypertensive heart disease with heart failure: Secondary | ICD-10-CM | POA: Insufficient documentation

## 2022-08-01 DIAGNOSIS — Z79899 Other long term (current) drug therapy: Secondary | ICD-10-CM | POA: Diagnosis not present

## 2022-08-01 DIAGNOSIS — Z905 Acquired absence of kidney: Secondary | ICD-10-CM | POA: Insufficient documentation

## 2022-08-01 DIAGNOSIS — Z7984 Long term (current) use of oral hypoglycemic drugs: Secondary | ICD-10-CM | POA: Insufficient documentation

## 2022-08-01 LAB — CBC
HCT: 38 % (ref 36.0–46.0)
Hemoglobin: 12.5 g/dL (ref 12.0–15.0)
MCH: 29.1 pg (ref 26.0–34.0)
MCHC: 32.9 g/dL (ref 30.0–36.0)
MCV: 88.4 fL (ref 80.0–100.0)
Platelets: 256 10*3/uL (ref 150–400)
RBC: 4.3 MIL/uL (ref 3.87–5.11)
RDW: 13.5 % (ref 11.5–15.5)
WBC: 5 10*3/uL (ref 4.0–10.5)
nRBC: 0 % (ref 0.0–0.2)

## 2022-08-01 LAB — BASIC METABOLIC PANEL
Anion gap: 6 (ref 5–15)
BUN: 17 mg/dL (ref 6–20)
CO2: 27 mmol/L (ref 22–32)
Calcium: 9.2 mg/dL (ref 8.9–10.3)
Chloride: 104 mmol/L (ref 98–111)
Creatinine, Ser: 0.99 mg/dL (ref 0.44–1.00)
GFR, Estimated: >60 mL/min (ref >60)
Glucose, Bld: 122 mg/dL — ABNORMAL HIGH (ref 70–99)
Potassium: 4.2 mmol/L (ref 3.5–5.1)
Sodium: 137 mmol/L (ref 135–145)

## 2022-08-01 LAB — DIGOXIN LEVEL: Digoxin Level: 0.8 ng/mL (ref 0.8–2.0)

## 2022-08-01 LAB — BRAIN NATRIURETIC PEPTIDE: B Natriuretic Peptide: 242.4 pg/mL — ABNORMAL HIGH (ref 0.0–100.0)

## 2022-08-01 NOTE — Patient Instructions (Signed)
There has been no changes to your medications.  Your physician recommends that you schedule a follow-up appointment in: 3 months (May 2024)  ** please call the office in March to arrange your follow up appointment **  If you have any questions or concerns before your next appointment please send [place] a message through [place] or call our office at [phone].    TO LEAVE A MESSAGE FOR THE NURSE SELECT OPTION 2, PLEASE LEAVE A MESSAGE INCLUDING: YOUR NAME DATE OF BIRTH CALL BACK NUMBER REASON FOR CALL**this is important as we prioritize the call backs  YOU WILL RECEIVE A CALL BACK THE SAME DAY AS LONG AS YOU CALL BEFORE 4:00 PM  At the [facility], you and your health needs are our priority. As part of our continuing mission to provide you with exceptional heart care, we have created designated Provider Care Teams. These Care Teams include your primary Cardiologist (physician) and Advanced Practice Providers (APPs- Physician Assistants and Nurse Practitioners) who all work together to provide you with the care you need, when you need it.   You may see any of the following providers on your designated Care Team at your next follow up: Dr Glori Bickers Dr Loralie Champagne Dr. Roxana Hires, NP Lyda Jester, [place] [facility] [place], [place] Forestine Na, NP Audry Riles, PharmD   Please be sure to bring in all your medications bottles to every appointment.    Thank you for choosing [facility]

## 2022-08-02 ENCOUNTER — Ambulatory Visit: Payer: Medicaid Other | Attending: Physician Assistant | Admitting: Physical Therapy

## 2022-08-02 ENCOUNTER — Encounter: Payer: Self-pay | Admitting: Physical Therapy

## 2022-08-02 DIAGNOSIS — G8929 Other chronic pain: Secondary | ICD-10-CM | POA: Diagnosis present

## 2022-08-02 DIAGNOSIS — R600 Localized edema: Secondary | ICD-10-CM | POA: Diagnosis present

## 2022-08-02 DIAGNOSIS — M6281 Muscle weakness (generalized): Secondary | ICD-10-CM | POA: Diagnosis present

## 2022-08-02 DIAGNOSIS — M25511 Pain in right shoulder: Secondary | ICD-10-CM | POA: Diagnosis present

## 2022-08-02 NOTE — Therapy (Signed)
OUTPATIENT PHYSICAL THERAPY TREATMENT NOTE   Patient Name: Lynn Estrada MRN: [id number] DOB:[birth year], 62 y.o., female Today's Date: 08/02/2022  PCP: [name], PA  REFERRING PROVIDER: Wendall Mola, PA-C   END OF SESSION:   PT End of Session - 08/02/22 0952     Visit Number 6    Number of Visits 11    Date for PT Re-Evaluation 09/05/22    Authorization Type [employer]    Authorization Time Period Approved 10 visits 07/11/22-09/09/22    PT Start Time 1000    PT Stop Time 4081    PT Time Calculation (min) 41 min    Activity Tolerance Patient tolerated treatment well;Patient limited by pain    Behavior During Therapy [facility] for tasks assessed/performed             Past Medical History:  Diagnosis Date   Anemia    Eczema    GERD (gastroesophageal reflux disease)    History of adenomatous polyp of colon    08-03-2016  tubular adenoma x2 and hyperplastic   History of chronic gastritis 08/03/2016   History of ectopic pregnancy 1988   s/p  left salpingectomy   History of endometriosis    History of kidney stones    History of partial nephrectomy    right pelvis for very large stone   History of pneumonia 07/02/2016   CAP   History of sepsis    07-01-2016  sepsis w/ pyelonephritis, CAP /   12-31-2016  urosepsis w/ kidney stone obstruction   History of uterine leiomyoma    Hypertension    Left ureteral stone    Nephrolithiasis    left obstructive stone and right non-obstructive stone  per CT 12-31-2016   Urgency of urination    Past Surgical History:  Procedure Laterality Date   ABDOMINAL HYSTERECTOMY  01/20/2007   w/ Lysis Adhesions/  Left salpingoophorectomy/  Right Salpingectomy   CHOLECYSTECTOMY  1994   and Right Partial Nephrectomy (pelvis for very large stone)   CYSTOSCOPY W/ URETERAL STENT PLACEMENT Left 12/31/2016   Procedure: CYSTOSCOPY WITH RETROGRADE PYELOGRAM/ LEFT URETERAL STENT PLACEMENT;  Surgeon: Alexis Frock, MD;  Location:  [facility];  Service: Urology;  Laterality: Left;   CYSTOSCOPY WITH RETROGRADE PYELOGRAM, URETEROSCOPY AND STENT PLACEMENT Left 01/18/2017   Procedure: 1ST STAGE CYSTOSCOPY WITH RETROGRADE PYELOGRAM, URETEROSCOPY AND STENT REPLACEMENT;  Surgeon: Alexis Frock, MD;  Location: [facility];  Service: Urology;  Laterality: Left;   CYSTOSCOPY WITH RETROGRADE PYELOGRAM, URETEROSCOPY AND STENT PLACEMENT Left 02/01/2017   Procedure: 2ND STAGE CYSTOSCOPY WITH RETROGRADE PYELOGRAM, URETEROSCOPY AND STENT REPLACEMENT;  Surgeon: Alexis Frock, MD;  Location: [facility];  Service: Urology;  Laterality: Left;   ECTOPIC PREGNANCY SURGERY  1988   Left Salpingectomy   HOLMIUM LASER APPLICATION Left 4/48/1856   Procedure: HOLMIUM LASER APPLICATION;  Surgeon: Alexis Frock, MD;  Location: [facility];  Service: Urology;  Laterality: Left;   HOLMIUM LASER APPLICATION Left 08/30/4968   Procedure: HOLMIUM LASER APPLICATION;  Surgeon: Alexis Frock, MD;  Location: [facility];  Service: Urology;  Laterality: Left;   LUMBAR DISC SURGERY  01/1999   right L5 -- S1   RE-EXPLORATION LUMBAR/  LAMINECTOMY AND MICRODISKECTOMY  10/14/2001   right L5 -- S1   RIGHT HEART CATH N/A 05/23/2022   Procedure: RIGHT HEART CATH;  Surgeon: Jolaine Artist, MD;  Location: [place] [facility];  Service: Cardiovascular;  Laterality: N/A;   RIGHT/LEFT  HEART CATH AND CORONARY ANGIOGRAPHY N/A 07/18/2021   Procedure: RIGHT/LEFT HEART CATH AND CORONARY ANGIOGRAPHY;  Surgeon: Jolaine Artist, MD;  Location: [place] [facility];  Service: Cardiovascular;  Laterality: N/A;   TUBAL LIGATION Bilateral 31/49/7026   PPTL   UMBILICAL HERNIA REPAIR  age 61   Patient Active Problem List   Diagnosis Date Noted   LBBB (left bundle branch block) 02/01/2022   Chronic combined systolic and diastolic heart failure ([place]) 07/27/2021   Acute combined systolic (congestive) and diastolic  (congestive) heart failure (H[facility])    Hypocalcemia 07/12/2021   Elevated troponin 07/12/2021   Suspected COVID-19 virus infection 06/11/2019   Acute respiratory failure with hypoxia ([place]) 06/11/2019   Multifocal pneumonia 06/11/2019   Ureteral stone with hydronephrosis 12/31/2016   Normocytic anemia 07/10/2016   Essential hypertension 07/26/2015   Eczema 07/26/2015   Overweight (BMI 25.0-29.9) 07/26/2015    REFERRING DIAG: right shoulder pain   THERAPY DIAG:  Chronic right shoulder pain  Muscle weakness  Localized edema  Rationale for Evaluation and Treatment Rehabilitation  PERTINENT HISTORY: Heart failure (does not tolerate reclined positions), history of heart attack   PRECAUTIONS: Heart failure (does not tolerate reclined positions); Adhesive capsulitis (?)  SUBJECTIVE:                                                                                                                                                                                      SUBJECTIVE STATEMENT:  Pt reports that her shoulder felt much better after last visit.  She has been HEP compliant.   PAIN:  Are you having pain? 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                               SENSATION:          Light touch: Appears intact           PALPATION: Exquisite TTP R anterior shoulder and LH biceps tendon   UPPER EXTREMITY AROM:   ROM Right 07/11/2022 Left 07/11/2022  Shoulder flexion 65 135  Shoulder abduction 65 135  Shoulder internal rotation      Shoulder external  rotation      Functional IR To  greater troch T1  Functional ER To ear* T4*  Shoulder extension      Elbow extension      Elbow flexion        (Blank rows = not tested, N = WNL, * = concordant pain with testing)   UPPER EXTREMITY MMT:   MMT Right 07/11/2022 Left 07/11/2022  Shoulder flexion Unable d/t pain    Shoulder abduction (C5) Unable d/t pain    Shoulder ER 4 4  Shoulder IR 3* 4  Middle trapezius      Lower trapezius      Shoulder extension      Grip strength      Cervical flexion (C1,C2)      Cervical S/B (C3)      Shoulder shrug (C4)      Elbow flexion (C6) 3*    Elbow ext (C7)      Thumb ext (C8)      Finger abd (T1)      Grossly        (Blank rows = not tested, score listed is out of 5 possible points.  N = WNL, D = diminished, C = clear for gross weakness with myotome testing, * = concordant pain with testing)   UPPER EXTREMITY PROM:   PROM Right 07/11/2022 Left 07/11/2022  Shoulder flexion 90    Shoulder abduction 85    Shoulder internal rotation      Shoulder external rotation at 45 ~30 degrees    Functional IR      Functional ER      Shoulder extension      Elbow extension      Elbow flexion        (Blank rows = not tested, N = WNL, * = concordant pain with testing)   SHOULDER SPECIAL TESTS:           RC test cluster: + painful arc, - ER, - drop arm           Yergason's Test (+)     PATIENT SURVEYS:  Quick Dash 36/55              TODAY'S TREATMENT:  Creating, reviewing, and completing below HEP     PATIENT EDUCATION:  POC, diagnosis, prognosis, HEP, and outcome measures.  Pt educated via explanation, demonstration, and handout (HEP).  Pt confirms understanding verbally.    HOME EXERCISE PROGRAM: Access Code: [email or web address] URL: [email or web address] Date: 07/11/2022 Prepared by: Shearon Balo   Exercises - Seated Isometric Elbow Flexion  - 3 x daily - 7 x weekly - 3 sets - 10 reps - 3'' hold   DAILY TREATMENT: Therapeutic  Exercise: - UBE - 3'/3' - L 2 - Elbow - flexion isometric - RTB - PT resistance - 3x10 - ER and IR isometric - RTB - PT resistance - 3x10 - seated row - 10# - 3x10  Manual Therapy (performed in reclined position on table, no supine): - GH joint mobs - PROM all directions   Modalities:  Vasopneumatic (Game Ready)    Location:  right shoulder Time:  10 minutes Pressure:  medium Temperature:  44 degrees   ASSESSMENT:   CLINICAL IMPRESSION: Keiarra tolerated session fair with no adverse reaction.  She continues to be very limited in movement - consistent with AC.  She will undergo imaging next week.  She may benefit from a steroid injection.  Her ROM and pain is improved following manual.      OBJECTIVE IMPAIRMENTS: Pain, shoulder ROM,  shoulder strength   ACTIVITY LIMITATIONS: lifting, reaching, housework   PERSONAL FACTORS: See medical history and pertinent history     REHAB POTENTIAL: Fair chronic   CLINICAL DECISION MAKING: Stable/uncomplicated   EVALUATION COMPLEXITY: Low     GOALS:     SHORT TERM GOALS: Target date: 08/08/2022   [place] will be >75% HEP compliant to improve carryover between sessions and facilitate independent management of condition   Evaluation (07/11/2022): ongoing Goal status: MET 1/30     LONG TERM GOALS: Target date: 09/05/2022   [place] will show a >/= 15 pt improvement in her QUICK DASH score (MCID is 13.4 pts or 8%) as a proxy for functional improvement    Evaluation/Baseline (07/11/2022): 35/55 pts Goal status: INITIAL     2.  [place] will self report >/= 50% decrease in pain from evaluation    Evaluation/Baseline (07/11/2022): 10/10 max pain 1/30: 10/10 max, 4/10 min Goal status: ongoing     3.  [place]e will demonstrate >120 degrees of active ROM in flexion to allow completion of activities involving reaching OH, not limited by pain   Evaluation/Baseline (07/11/2022): 65 degrees passive, unable active 1/30: 60 AROM w/  pain Goal status: INITIAL     4.  [place] will be able to complete light to moderate housework, not limited by pain   Evaluation/Baseline (07/11/2022): limited 1/30: able to do light housework (wash dishes), but no moderate housework Goal status: ongoing     PLAN: PT FREQUENCY: 1-2x/week   PT DURATION: 8 weeks (Ending 09/05/2022)   PLANNED INTERVENTIONS: Therapeutic exercises, Aquatic therapy, Therapeutic activity, Neuro Muscular re-education, Gait training, Patient/Family education, Joint mobilization, Dry Needling, Electrical stimulation, Spinal mobilization and/or manipulation, Moist heat, Taping, Vasopneumatic device, Ionotophoresis '4mg'$ /ml Dexamethasone, and Manual therapy   PLAN FOR NEXT SESSION: MT for ROM, GH joint mobs, progressive biceps loading   Mathis Dad, PT 08/02/2022, 10:42 AM

## 2022-08-07 ENCOUNTER — Ambulatory Visit: Payer: Medicaid Other

## 2022-08-07 DIAGNOSIS — M25511 Pain in right shoulder: Secondary | ICD-10-CM | POA: Diagnosis not present

## 2022-08-07 DIAGNOSIS — M6281 Muscle weakness (generalized): Secondary | ICD-10-CM

## 2022-08-07 DIAGNOSIS — R600 Localized edema: Secondary | ICD-10-CM

## 2022-08-07 DIAGNOSIS — G8929 Other chronic pain: Secondary | ICD-10-CM

## 2022-08-07 NOTE — Therapy (Signed)
OUTPATIENT PHYSICAL THERAPY TREATMENT NOTE   Patient Name: Lynn Estrada MRN: [id number] DOB:1961-06-17, 62 y.o., female Today's Date: 08/07/2022  PCP: [name], PA  REFERRING PROVIDER: Wendall Mola, PA-C   END OF SESSION:   PT End of Session - 08/07/22 0959     Visit Number 7    Number of Visits 11    Date for PT Re-Evaluation 09/05/22    Authorization Type [employer] [place] - [employer]    Authorization Time Period Approved 10 visits 07/11/22-09/09/22    Authorization - Visit Number 7    Authorization - Number of Visits 10    PT Start Time 1000    PT Stop Time 1050    PT Time Calculation (min) 50 min    Activity Tolerance Patient tolerated treatment well;Patient limited by pain    Behavior During Therapy [facility] for tasks assessed/performed              Past Medical History:  Diagnosis Date   Anemia    Eczema    GERD (gastroesophageal reflux disease)    History of adenomatous polyp of colon    08-03-2016  tubular adenoma x2 and hyperplastic   History of chronic gastritis 08/03/2016   History of ectopic pregnancy 1988   s/p  left salpingectomy   History of endometriosis    History of kidney stones    History of partial nephrectomy    right pelvis for very large stone   History of pneumonia 07/02/2016   CAP   History of sepsis    07-01-2016  sepsis w/ pyelonephritis, CAP /   12-31-2016  urosepsis w/ kidney stone obstruction   History of uterine leiomyoma    Hypertension    Left ureteral stone    Nephrolithiasis    left obstructive stone and right non-obstructive stone  per CT 12-31-2016   Urgency of urination    Past Surgical History:  Procedure Laterality Date   ABDOMINAL HYSTERECTOMY  01/20/2007   w/ Lysis Adhesions/  Left salpingoophorectomy/  Right Salpingectomy   CHOLECYSTECTOMY  1994   and Right Partial Nephrectomy (pelvis for very large stone)   CYSTOSCOPY W/ URETERAL STENT PLACEMENT Left 12/31/2016   Procedure: CYSTOSCOPY WITH RETROGRADE  PYELOGRAM/ LEFT URETERAL STENT PLACEMENT;  Surgeon: Alexis Frock, MD;  Location: [facility];  Service: Urology;  Laterality: Left;   CYSTOSCOPY WITH RETROGRADE PYELOGRAM, URETEROSCOPY AND STENT PLACEMENT Left 01/18/2017   Procedure: 1ST STAGE CYSTOSCOPY WITH RETROGRADE PYELOGRAM, URETEROSCOPY AND STENT REPLACEMENT;  Surgeon: Alexis Frock, MD;  Location: [facility];  Service: Urology;  Laterality: Left;   CYSTOSCOPY WITH RETROGRADE PYELOGRAM, URETEROSCOPY AND STENT PLACEMENT Left 02/01/2017   Procedure: 2ND STAGE CYSTOSCOPY WITH RETROGRADE PYELOGRAM, URETEROSCOPY AND STENT REPLACEMENT;  Surgeon: Alexis Frock, MD;  Location: [facility];  Service: Urology;  Laterality: Left;   ECTOPIC PREGNANCY SURGERY  1988   Left Salpingectomy   HOLMIUM LASER APPLICATION Left 3/32/9518   Procedure: HOLMIUM LASER APPLICATION;  Surgeon: Alexis Frock, MD;  Location: [facility];  Service: Urology;  Laterality: Left;   HOLMIUM LASER APPLICATION Left 02/03/1659   Procedure: HOLMIUM LASER APPLICATION;  Surgeon: Alexis Frock, MD;  Location: [facility];  Service: Urology;  Laterality: Left;   LUMBAR DISC SURGERY  01/1999   right L5 -- S1   RE-EXPLORATION LUMBAR/  LAMINECTOMY AND MICRODISKECTOMY  10/14/2001   right L5 -- S1   RIGHT HEART CATH N/A 05/23/2022   Procedure: RIGHT HEART CATH;  Surgeon: Haroldine Laws,  Shaune Pascal, MD;  Location: [place] [facility];  Service: Cardiovascular;  Laterality: N/A;   RIGHT/LEFT HEART CATH AND CORONARY ANGIOGRAPHY N/A 07/18/2021   Procedure: RIGHT/LEFT HEART CATH AND CORONARY ANGIOGRAPHY;  Surgeon: Jolaine Artist, MD;  Location: [place] [facility];  Service: Cardiovascular;  Laterality: N/A;   TUBAL LIGATION Bilateral 44/07/270   PPTL   UMBILICAL HERNIA REPAIR  age 36   Patient Active Problem List   Diagnosis Date Noted   LBBB (left bundle branch block) 02/01/2022   Chronic combined systolic and diastolic heart  failure ([place]) 07/27/2021   Acute combined systolic (congestive) and diastolic (congestive) heart failure (HCC)    Hypocalcemia 07/12/2021   Elevated troponin 07/12/2021   Suspected COVID-19 virus infection 06/11/2019   Acute respiratory failure with hypoxia ([place]) 06/11/2019   Multifocal pneumonia 06/11/2019   Ureteral stone with hydronephrosis 12/31/2016   Normocytic anemia 07/10/2016   Essential hypertension 07/26/2015   Eczema 07/26/2015   Overweight (BMI 25.0-29.9) 07/26/2015    REFERRING DIAG: right shoulder pain   THERAPY DIAG:  Chronic right shoulder pain  Muscle weakness  Localized edema  Rationale for Evaluation and Treatment Rehabilitation  PERTINENT HISTORY: Heart failure (does not tolerate reclined positions), history of heart attack   PRECAUTIONS: Heart failure (does not tolerate reclined positions); Adhesive capsulitis (?)  SUBJECTIVE:                                                                                                                                                                                      SUBJECTIVE STATEMENT:  Patient reports increased pain today, stating she felt a "nagging" pain. She ahs not heard back about her MRI results yet.   PAIN:  Are you having pain? 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                               SENSATION:          Light touch: Appears intact           PALPATION: Exquisite TTP R anterior shoulder and LH biceps tendon   UPPER EXTREMITY AROM:   ROM Right 07/11/2022 Left 07/11/2022   Shoulder flexion 65 135  Shoulder abduction  65 135  Shoulder internal rotation      Shoulder external rotation      Functional IR To greater troch T1  Functional ER To ear* T4*  Shoulder extension      Elbow extension      Elbow flexion        (Blank rows = not tested, N = WNL, * = concordant pain with testing)   UPPER EXTREMITY MMT:   MMT Right 07/11/2022 Left 07/11/2022  Shoulder flexion Unable d/t pain    Shoulder abduction (C5) Unable d/t pain    Shoulder ER 4 4  Shoulder IR 3* 4  Middle trapezius      Lower trapezius      Shoulder extension      Grip strength      Cervical flexion (C1,C2)      Cervical S/B (C3)      Shoulder shrug (C4)      Elbow flexion (C6) 3*    Elbow ext (C7)      Thumb ext (C8)      Finger abd (T1)      Grossly        (Blank rows = not tested, score listed is out of 5 possible points.  N = WNL, D = diminished, C = clear for gross weakness with myotome testing, * = concordant pain with testing)   UPPER EXTREMITY PROM:   PROM Right 07/11/2022 Left 07/11/2022  Shoulder flexion 90    Shoulder abduction 85    Shoulder internal rotation      Shoulder external rotation at 45 ~30 degrees    Functional IR      Functional ER      Shoulder extension      Elbow extension      Elbow flexion        (Blank rows = not tested, N = WNL, * = concordant pain with testing)   SHOULDER SPECIAL TESTS:           RC test cluster: + painful arc, - ER, - drop arm           Yergason's Test (+)     PATIENT SURVEYS:  Quick Dash 36/55              TODAY'S TREATMENT:  Creating, reviewing, and completing below HEP     PATIENT EDUCATION:  POC, diagnosis, prognosis, HEP, and outcome measures.  Pt educated via explanation, demonstration, and handout (HEP).  Pt confirms understanding verbally.    HOME EXERCISE PROGRAM: Access Code: [email or web address] URL: [email or web address] Date: 07/11/2022 Prepared by: Shearon Balo   Exercises - Seated  Isometric Elbow Flexion  - 3 x daily - 7 x weekly - 3 sets - 10 reps - 3'' hold   TREATMENT 08/07/22: Therapeutic Exercise: - UBE - 3'/3' - L 2 - Elbow - flexion isometric - RTB - therapist resistance - 2x10 - ER and IR isometric - RTB - therapist resistance - 3x10 - seated row - 10# - 3x10  Manual Therapy (performed in reclined position on table, no supine): - GH joint mobs - PROM all directions  Modalities: Vasopneumatic (Game Ready)   Location:  right shoulder Time:  10 minutes Pressure:  medium Temperature:  44 degrees   DAILY TREATMENT: Therapeutic Exercise: - UBE - 3'/3' - L 2 - Elbow - flexion isometric - RTB - PT resistance - 3x10 - ER and IR isometric - RTB - PT resistance - 3x10 - seated row - 10# -  3x10  Manual Therapy (performed in reclined position on table, no supine): - GH joint mobs - PROM all directions   Modalities:  Vasopneumatic (Game Ready)    Location:  right shoulder Time:  10 minutes Pressure:  medium Temperature:  44 degrees   ASSESSMENT:   CLINICAL IMPRESSION: Patient presents to PT reporting increased pain today, she states she has not heard back from her MRI results yet. Session today continued to focus on gentle ROM, strengthening, and joint mobs. She remains limited by pain throughout session. Patient continues to benefit from skilled PT services and should be progressed as able to improve functional independence.     OBJECTIVE IMPAIRMENTS: Pain, shoulder ROM, shoulder strength   ACTIVITY LIMITATIONS: lifting, reaching, housework   PERSONAL FACTORS: See medical history and pertinent history     REHAB POTENTIAL: Fair chronic   CLINICAL DECISION MAKING: Stable/uncomplicated   EVALUATION COMPLEXITY: Low     GOALS:     SHORT TERM GOALS: Target date: 08/08/2022   Chastity will be >75% HEP compliant to improve carryover between sessions and facilitate independent management of condition   Evaluation (07/11/2022): ongoing Goal  status: MET 1/30     LONG TERM GOALS: Target date: 09/05/2022   [place] will show a >/= 15 pt improvement in her QUICK DASH score (MCID is 13.4 pts or 8%) as a proxy for functional improvement    Evaluation/Baseline (07/11/2022): 35/55 pts Goal status: INITIAL     2.  Araminta will self report >/= 50% decrease in pain from evaluation    Evaluation/Baseline (07/11/2022): 10/10 max pain 1/30: 10/10 max, 4/10 min Goal status: ongoing     3.  Kandee will demonstrate >120 degrees of active ROM in flexion to allow completion of activities involving reaching OH, not limited by pain   Evaluation/Baseline (07/11/2022): 65 degrees passive, unable active 1/30: 60 AROM w/ pain Goal status: INITIAL     4.  Ceria will be able to complete light to moderate housework, not limited by pain   Evaluation/Baseline (07/11/2022): limited 1/30: able to do light housework (wash dishes), but no moderate housework Goal status: ongoing     PLAN: PT FREQUENCY: 1-2x/week   PT DURATION: 8 weeks (Ending 09/05/2022)   PLANNED INTERVENTIONS: Therapeutic exercises, Aquatic therapy, Therapeutic activity, Neuro Muscular re-education, Gait training, Patient/Family education, Joint mobilization, Dry Needling, Electrical stimulation, Spinal mobilization and/or manipulation, Moist heat, Taping, Vasopneumatic device, Ionotophoresis '4mg'$ /ml Dexamethasone, and Manual therapy   PLAN FOR NEXT SESSION: MT for ROM, GH joint mobs, progressive biceps loading   Margarette Canada, PTA 08/07/2022, 10:41 AM

## 2022-08-08 NOTE — Therapy (Signed)
OUTPATIENT PHYSICAL THERAPY TREATMENT NOTE   Patient Name: Lynn Estrada MRN: [id number] DOB:06-Mar-1961, 62 y.o., female Today's Date: 08/09/2022  PCP: [name], PA  REFERRING PROVIDER: Wendall Mola, PA-C   END OF SESSION:   PT End of Session - 08/09/22 1002     Visit Number 8    Number of Visits 11    Date for PT Re-Evaluation 09/05/22    Authorization Type [employer] [place] - [employer]    Authorization Time Period Approved 10 visits 07/11/22-09/09/22    Authorization - Visit Number 8    Authorization - Number of Visits 10    PT Start Time 1000    PT Stop Time 1050    PT Time Calculation (min) 50 min    Activity Tolerance Patient tolerated treatment well;Patient limited by pain    Behavior During Therapy [facility] for tasks assessed/performed               Past Medical History:  Diagnosis Date   Anemia    Eczema    GERD (gastroesophageal reflux disease)    History of adenomatous polyp of colon    08-03-2016  tubular adenoma x2 and hyperplastic   History of chronic gastritis 08/03/2016   History of ectopic pregnancy 1988   s/p  left salpingectomy   History of endometriosis    History of kidney stones    History of partial nephrectomy    right pelvis for very large stone   History of pneumonia 07/02/2016   CAP   History of sepsis    07-01-2016  sepsis w/ pyelonephritis, CAP /   12-31-2016  urosepsis w/ kidney stone obstruction   History of uterine leiomyoma    Hypertension    Left ureteral stone    Nephrolithiasis    left obstructive stone and right non-obstructive stone  per CT 12-31-2016   Urgency of urination    Past Surgical History:  Procedure Laterality Date   ABDOMINAL HYSTERECTOMY  01/20/2007   w/ Lysis Adhesions/  Left salpingoophorectomy/  Right Salpingectomy   CHOLECYSTECTOMY  1994   and Right Partial Nephrectomy (pelvis for very large stone)   CYSTOSCOPY W/ URETERAL STENT PLACEMENT Left 12/31/2016   Procedure: CYSTOSCOPY WITH RETROGRADE  PYELOGRAM/ LEFT URETERAL STENT PLACEMENT;  Surgeon: Alexis Frock, MD;  Location: [facility];  Service: Urology;  Laterality: Left;   CYSTOSCOPY WITH RETROGRADE PYELOGRAM, URETEROSCOPY AND STENT PLACEMENT Left 01/18/2017   Procedure: 1ST STAGE CYSTOSCOPY WITH RETROGRADE PYELOGRAM, URETEROSCOPY AND STENT REPLACEMENT;  Surgeon: Alexis Frock, MD;  Location[facility];  Service: Urology;  Laterality: Left;   CYSTOSCOPY WITH RETROGRADE PYELOGRAM, URETEROSCOPY AND STENT PLACEMENT Left 02/01/2017   Procedure: 2ND STAGE CYSTOSCOPY WITH RETROGRADE PYELOGRAM, URETEROSCOPY AND STENT REPLACEMENT;  Surgeon: Alexis Frock, MD;  Location: [facility];  Service: Urology;  Laterality: Left;   ECTOPIC PREGNANCY SURGERY  1988   Left Salpingectomy   HOLMIUM LASER APPLICATION Left 7/78/2423   Procedure: HOLMIUM LASER APPLICATION;  Surgeon: Alexis Frock, MD;  Location: [facility];  Service: Urology;  Laterality: Left;   HOLMIUM LASER APPLICATION Left 11/02/6142   Procedure: HOLMIUM LASER APPLICATION;  Surgeon: Alexis Frock, MD;  Location: [place];  Service: Urology;  Laterality: Left;   LUMBAR DISC SURGERY  01/1999   right L5 -- S1   RE-EXPLORATION LUMBAR/  LAMINECTOMY AND MICRODISKECTOMY  10/14/2001   right L5 -- S1   RIGHT HEART CATH N/A 05/23/2022   Procedure: RIGHT HEART CATH;  Surgeon:  Bensimhon, Shaune Pascal, MD;  Location: [place] [facility];  Service: Cardiovascular;  Laterality: N/A;   RIGHT/LEFT HEART CATH AND CORONARY ANGIOGRAPHY N/A 07/18/2021   Procedure: RIGHT/LEFT HEART CATH AND CORONARY ANGIOGRAPHY;  Surgeon: Jolaine Artist, MD;  Location: [place] [facility];  Service: Cardiovascular;  Laterality: N/A;   TUBAL LIGATION Bilateral 56/43/3295   PPTL   UMBILICAL HERNIA REPAIR  age 13   Patient Active Problem List   Diagnosis Date Noted   LBBB (left bundle branch block) 02/01/2022   Chronic combined systolic and diastolic heart  failure ([place]) 07/27/2021   Acute combined systolic (congestive) and diastolic (congestive) heart failure (HCC)    Hypocalcemia 07/12/2021   Elevated troponin 07/12/2021   Suspected COVID-19 virus infection 06/11/2019   Acute respiratory failure with hypoxia ([place]) 06/11/2019   Multifocal pneumonia 06/11/2019   Ureteral stone with hydronephrosis 12/31/2016   Normocytic anemia 07/10/2016   Essential hypertension 07/26/2015   Eczema 07/26/2015   Overweight (BMI 25.0-29.9) 07/26/2015    REFERRING DIAG: right shoulder pain   THERAPY DIAG:  Chronic right shoulder pain  Muscle weakness  Localized edema  Rationale for Evaluation and Treatment Rehabilitation  PERTINENT HISTORY: Heart failure (does not tolerate reclined positions), history of heart attack   PRECAUTIONS: Heart failure (does not tolerate reclined positions); Adhesive capsulitis (?)  SUBJECTIVE:                                                                                                                                                                                      SUBJECTIVE STATEMENT:  Patient reports continued high levels of pain, she states she saw her MRI results in her [facility] chart, but that they haven't called to go over it with her yet.    PAIN:  Are you having pain? 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                               SENSATION:          Light touch: Appears intact           PALPATION: Exquisite TTP R anterior shoulder and LH biceps tendon   UPPER EXTREMITY AROM:   ROM  Right  07/11/2022 Left 07/11/2022  Shoulder flexion 65 135  Shoulder abduction 65 135  Shoulder internal rotation      Shoulder external rotation      Functional IR To greater troch T1  Functional ER To ear* T4*  Shoulder extension      Elbow extension      Elbow flexion        (Blank rows = not tested, N = WNL, * = concordant pain with testing)   UPPER EXTREMITY MMT:   MMT Right 07/11/2022 Left 07/11/2022  Shoulder flexion Unable d/t pain    Shoulder abduction (C5) Unable d/t pain    Shoulder ER 4 4  Shoulder IR 3* 4  Middle trapezius      Lower trapezius      Shoulder extension      Grip strength      Cervical flexion (C1,C2)      Cervical S/B (C3)      Shoulder shrug (C4)      Elbow flexion (C6) 3*    Elbow ext (C7)      Thumb ext (C8)      Finger abd (T1)      Grossly        (Blank rows = not tested, score listed is out of 5 possible points.  N = WNL, D = diminished, C = clear for gross weakness with myotome testing, * = concordant pain with testing)   UPPER EXTREMITY PROM:   PROM Right 07/11/2022 Left 07/11/2022  Shoulder flexion 90    Shoulder abduction 85    Shoulder internal rotation      Shoulder external rotation at 45 ~30 degrees    Functional IR      Functional ER      Shoulder extension      Elbow extension      Elbow flexion        (Blank rows = not tested, N = WNL, * = concordant pain with testing)   SHOULDER SPECIAL TESTS:           RC test cluster: + painful arc, - ER, - drop arm           Yergason's Test (+)     PATIENT SURVEYS:  Quick Dash 36/55              TODAY'S TREATMENT:  Creating, reviewing, and completing below HEP     PATIENT EDUCATION:  POC, diagnosis, prognosis, HEP, and outcome measures.  Pt educated via explanation, demonstration, and handout (HEP).  Pt confirms understanding verbally.    HOME EXERCISE PROGRAM: Access Code: [email or web address] URL: [email or web address] Date: 07/11/2022 Prepared by: Shearon Balo   Exercises - Seated Isometric Elbow Flexion  - 3 x daily - 7 x weekly - 3 sets - 10 reps - 3'' hold  TREATMENT 08/09/22: Therapeutic Exercise: - UBE - 3'/3' - L 2 - Elbow - flexion isometric - RTB - therapist resistance - 2x10 - ER and IR isometric - RTB - therapist resistance - 3x10  Manual Therapy (performed in reclined position on table, no supine): - GH joint mobs - PROM all directions  Modalities: Vasopneumatic (Game Ready)   Location:  right shoulder Time:  10 minutes Pressure:  medium Temperature:  44 degrees   TREATMENT 08/07/22: Therapeutic Exercise: - UBE - 3'/3' - L 2 - Elbow - flexion isometric - RTB - therapist resistance - 2x10 - ER and IR isometric - RTB - therapist resistance - 3x10 - seated row -  10# - 3x10  Manual Therapy (performed in reclined position on table, no supine): - GH joint mobs - PROM all directions  Modalities: Vasopneumatic (Game Ready)   Location:  right shoulder Time:  10 minutes Pressure:  medium Temperature:  44 degrees   DAILY TREATMENT: Therapeutic Exercise: - UBE - 3'/3' - L 2 - Elbow - flexion isometric - RTB - PT resistance - 3x10 - ER and IR isometric - RTB - PT resistance - 3x10 - seated row - 10# - 3x10  Manual Therapy (performed in reclined position on table, no supine): - GH joint mobs - PROM all directions   Modalities:  Vasopneumatic (Game Ready)    Location:  right shoulder Time:  10 minutes Pressure:  medium Temperature:  44 degrees   ASSESSMENT:   CLINICAL IMPRESSION: Patient presents to PT with continued reports of pain in her Rt shoulder. She states she saw her MRI results in her [facility] chart, but has not received a call to go over them yes.  Session today continued to focus on gentle strengthening, ROM, and joint mobs. Patient continues to benefit from skilled PT services and should be progressed as able to improve functional independence.     OBJECTIVE IMPAIRMENTS: Pain, shoulder  ROM, shoulder strength   ACTIVITY LIMITATIONS: lifting, reaching, housework   PERSONAL FACTORS: See medical history and pertinent history     REHAB POTENTIAL: Fair chronic   CLINICAL DECISION MAKING: Stable/uncomplicated   EVALUATION COMPLEXITY: Low     GOALS:     SHORT TERM GOALS: Target date: 08/08/2022   [place] will be >75% HEP compliant to improve carryover between sessions and facilitate independent management of condition   Evaluation (07/11/2022): ongoing Goal status: MET 1/30     LONG TERM GOALS: Target date: 09/05/2022   [place] will show a >/= 15 pt improvement in her QUICK DASH score (MCID is 13.4 pts or 8%) as a proxy for functional improvement    Evaluation/Baseline (07/11/2022): 35/55 pts Goal status: INITIAL     2.  Librada will self report >/= 50% decrease in pain from evaluation    Evaluation/Baseline (07/11/2022): 10/10 max pain 1/30: 10/10 max, 4/10 min Goal status: ongoing     3.  Rashauna will demonstrate >120 degrees of active ROM in flexion to allow completion of activities involving reaching OH, not limited by pain   Evaluation/Baseline (07/11/2022): 65 degrees passive, unable active 1/30: 60 AROM w/ pain Goal status: INITIAL     4.  Vandella will be able to complete light to moderate housework, not limited by pain   Evaluation/Baseline (07/11/2022): limited 1/30: able to do light housework (wash dishes), but no moderate housework Goal status: ongoing     PLAN: PT FREQUENCY: 1-2x/week   PT DURATION: 8 weeks (Ending 09/05/2022)   PLANNED INTERVENTIONS: Therapeutic exercises, Aquatic therapy, Therapeutic activity, Neuro Muscular re-education, Gait training, Patient/Family education, Joint mobilization, Dry Needling, Electrical stimulation, Spinal mobilization and/or manipulation, Moist heat, Taping, Vasopneumatic device, Ionotophoresis '4mg'$ /ml Dexamethasone, and Manual therapy   PLAN FOR NEXT SESSION: MT for ROM, GH joint mobs, progressive  biceps loading   Margarette Canada, PTA 08/09/2022, 10:41 AM

## 2022-08-09 ENCOUNTER — Ambulatory Visit: Payer: Medicaid Other

## 2022-08-09 DIAGNOSIS — M25511 Pain in right shoulder: Secondary | ICD-10-CM | POA: Diagnosis not present

## 2022-08-09 DIAGNOSIS — G8929 Other chronic pain: Secondary | ICD-10-CM

## 2022-08-09 DIAGNOSIS — M6281 Muscle weakness (generalized): Secondary | ICD-10-CM

## 2022-08-09 DIAGNOSIS — R600 Localized edema: Secondary | ICD-10-CM

## 2022-08-14 ENCOUNTER — Ambulatory Visit: Payer: Medicaid Other | Admitting: Physical Therapy

## 2022-08-14 ENCOUNTER — Encounter: Payer: Self-pay | Admitting: Physical Therapy

## 2022-08-14 DIAGNOSIS — R600 Localized edema: Secondary | ICD-10-CM

## 2022-08-14 DIAGNOSIS — M6281 Muscle weakness (generalized): Secondary | ICD-10-CM

## 2022-08-14 DIAGNOSIS — M25511 Pain in right shoulder: Secondary | ICD-10-CM | POA: Diagnosis not present

## 2022-08-14 DIAGNOSIS — G8929 Other chronic pain: Secondary | ICD-10-CM

## 2022-08-14 NOTE — Therapy (Signed)
OUTPATIENT PHYSICAL THERAPY TREATMENT NOTE   Patient Name: Lynn Estrada MRN: [id number] DOB:10-15-1960, 62 y.o., female Today's Date: 08/14/2022  PCP: [name], PA  REFERRING PROVIDER: Wendall Mola, PA-C   END OF SESSION:   PT End of Session - 08/14/22 0959     Visit Number 9    Number of Visits 11    Date for PT Re-Evaluation 09/05/22    Authorization Type Jackquline [place] - Jeanie Cooks    Authorization Time Period Approved 10 visits 07/11/22-09/09/22    Authorization - Number of Visits 10    PT Start Time 1000    PT Stop Time [id number]    PT Time Calculation (min) 41 min    Activity Tolerance Patient tolerated treatment well;Patient limited by pain    Behavior During Therapy [facility] for tasks assessed/performed               Past Medical History:  Diagnosis Date   Anemia    Eczema    GERD (gastroesophageal reflux disease)    History of adenomatous polyp of colon    08-03-2016  tubular adenoma x2 and hyperplastic   History of chronic gastritis 08/03/2016   History of ectopic pregnancy 1988   s/p  left salpingectomy   History of endometriosis    History of kidney stones    History of partial nephrectomy    right pelvis for very large stone   History of pneumonia 07/02/2016   CAP   History of sepsis    07-01-2016  sepsis w/ pyelonephritis, CAP /   12-31-2016  urosepsis w/ kidney stone obstruction   History of uterine leiomyoma    Hypertension    Left ureteral stone    Nephrolithiasis    left obstructive stone and right non-obstructive stone  per CT 12-31-2016   Urgency of urination    Past Surgical History:  Procedure Laterality Date   ABDOMINAL HYSTERECTOMY  01/20/2007   w/ Lysis Adhesions/  Left salpingoophorectomy/  Right Salpingectomy   CHOLECYSTECTOMY  1994   and Right Partial Nephrectomy (pelvis for very large stone)   CYSTOSCOPY W/ URETERAL STENT PLACEMENT Left 12/31/2016   Procedure: CYSTOSCOPY WITH RETROGRADE PYELOGRAM/ LEFT URETERAL STENT  PLACEMENT;  Surgeon: Alexis Frock, MD;  Location: [facility];  Service: Urology;  Laterality: Left;   CYSTOSCOPY WITH RETROGRADE PYELOGRAM, URETEROSCOPY AND STENT PLACEMENT Left 01/18/2017   Procedure: 1ST STAGE CYSTOSCOPY WITH RETROGRADE PYELOGRAM, URETEROSCOPY AND STENT REPLACEMENT;  Surgeon: Alexis Frock, MD;  Location: [facility];  Service: Urology;  Laterality: Left;   CYSTOSCOPY WITH RETROGRADE PYELOGRAM, URETEROSCOPY AND STENT PLACEMENT Left 02/01/2017   Procedure: 2ND STAGE CYSTOSCOPY WITH RETROGRADE PYELOGRAM, URETEROSCOPY AND STENT REPLACEMENT;  Surgeon: Alexis Frock, MD;  Location: [facility];  Service: Urology;  Laterality: Left;   ECTOPIC PREGNANCY SURGERY  1988   Left Salpingectomy   HOLMIUM LASER APPLICATION Left [id number]   Procedure: HOLMIUM LASER APPLICATION;  Surgeon: Alexis Frock, MD;  Location: [facility];  Service: Urology;  Laterality: Left;   HOLMIUM LASER APPLICATION Left [id number]   Procedure: HOLMIUM LASER APPLICATION;  Surgeon: Alexis Frock, MD;  Location: [facility];  Service: Urology;  Laterality: Left;   LUMBAR DISC SURGERY  01/1999   right L5 -- S1   RE-EXPLORATION LUMBAR/  LAMINECTOMY AND MICRODISKECTOMY  10/14/2001   right L5 -- S1   RIGHT HEART CATH N/A 05/23/2022   Procedure: RIGHT HEART CATH;  Surgeon: Jolaine Artist, MD;  Location: [place]  [facility];  Service: Cardiovascular;  Laterality: N/A;   RIGHT/LEFT HEART CATH AND CORONARY ANGIOGRAPHY N/A 07/18/2021   Procedure: RIGHT/LEFT HEART CATH AND CORONARY ANGIOGRAPHY;  Surgeon: Jolaine Artist, MD;  Location: [place] [facility];  Service: Cardiovascular;  Laterality: N/A;   TUBAL LIGATION Bilateral [id number]   PPTL   UMBILICAL HERNIA REPAIR  age 62   Patient Active Problem List   Diagnosis Date Noted   LBBB (left bundle branch block) 02/01/2022   Chronic combined systolic and diastolic heart failure ([place]) 07/27/2021   Acute  combined systolic (congestive) and diastolic (congestive) heart failure (H[facility])    Hypocalcemia 07/12/2021   Elevated troponin 07/12/2021   Suspected COVID-19 virus infection 06/11/2019   Acute respiratory failure with hypoxia ([place]) 06/11/2019   Multifocal pneumonia 06/11/2019   Ureteral stone with hydronephrosis 12/31/2016   Normocytic anemia 07/10/2016   Essential hypertension 07/26/2015   Eczema 07/26/2015   Overweight (BMI 25.0-29.9) 07/26/2015    REFERRING DIAG: right shoulder pain   THERAPY DIAG:  Chronic right shoulder pain  Muscle weakness  Localized edema  Rationale for Evaluation and Treatment Rehabilitation  PERTINENT HISTORY: Heart failure (does not tolerate reclined positions), history of heart attack   PRECAUTIONS: Heart failure (does not tolerate reclined positions); Adhesive capsulitis (?)  SUBJECTIVE:                                                                                                                                                                                      SUBJECTIVE STATEMENT:  Pt states that he got her MRI, but has not received the results.  She rates her pain as a 6/10   PAIN:  Are you having pain? 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                               SENSATION:          Light touch: Appears intact           PALPATION: Exquisite TTP R anterior shoulder and LH biceps tendon   UPPER EXTREMITY AROM:   ROM Right 07/11/2022 Left 07/11/2022  Shoulder flexion 65 135  Shoulder abduction 65 135  Shoulder  internal rotation  Shoulder external rotation      Functional IR To greater troch T1  Functional ER To ear* T4*  Shoulder extension      Elbow extension      Elbow flexion        (Blank rows = not tested, N = WNL, * = concordant pain with testing)   UPPER EXTREMITY MMT:   MMT Right 07/11/2022 Left 07/11/2022  Shoulder flexion Unable d/t pain    Shoulder abduction (C5) Unable d/t pain    Shoulder ER 4 4  Shoulder IR 3* 4  Middle trapezius      Lower trapezius      Shoulder extension      Grip strength      Cervical flexion (C1,C2)      Cervical S/B (C3)      Shoulder shrug (C4)      Elbow flexion (C6) 3*    Elbow ext (C7)      Thumb ext (C8)      Finger abd (T1)      Grossly        (Blank rows = not tested, score listed is out of 5 possible points.  N = WNL, D = diminished, C = clear for gross weakness with myotome testing, * = concordant pain with testing)   UPPER EXTREMITY PROM:   PROM Right 07/11/2022 Left 07/11/2022 R 2/13  Shoulder flexion 90   95  Shoulder abduction 85   85  Shoulder internal rotation       Shoulder external rotation at 45 ~30 degrees   20  Functional IR       Functional ER       Shoulder extension       Elbow extension       Elbow flexion         (Blank rows = not tested, N = WNL, * = concordant pain with testing)   SHOULDER SPECIAL TESTS:           RC test cluster: + painful arc, - ER, - drop arm           Yergason's Test (+)     PATIENT SURVEYS:  Quick Dash 36/55              TODAY'S TREATMENT:  Creating, reviewing, and completing below HEP     PATIENT EDUCATION:  POC, diagnosis, prognosis, HEP, and outcome measures.  Pt educated via explanation, demonstration, and handout (HEP).  Pt confirms understanding verbally.    HOME EXERCISE PROGRAM: Access Code: [email or web address] URL: [email or web address] Date: 07/11/2022 Prepared by: Shearon Balo   Exercises - Seated Isometric Elbow Flexion  - 3 x daily - 7 x  weekly - 3 sets - 10 reps - 3'' hold  TREATMENT 08/14/22: Therapeutic Exercise: - UBE - 3'/3' - L 2 - Elbow - flexion isometric - RTB - therapist resistance - 2x10 - ER and IR isometric - RTB - therapist resistance - 3x10  Manual Therapy (performed in reclined position on table, no supine): - GH joint mobs - PROM all directions  Modalities: Vasopneumatic (Game Ready)   Location:  right shoulder Time:  10 minutes Pressure:  medium Temperature:  44 degrees   TREATMENT 08/07/22: Therapeutic Exercise: - UBE - 3'/3' - L 2 - Elbow - flexion isometric - RTB - therapist resistance - 2x10 - ER and IR isometric - RTB - therapist resistance - 3x10 - seated row - 10# - 3x10  Manual Therapy (performed in reclined position  on table, no supine): - GH joint mobs - PROM all directions  Modalities: Vasopneumatic (Game Ready)   Location:  right shoulder Time:  10 minutes Pressure:  medium Temperature:  44 degrees   DAILY TREATMENT: Therapeutic Exercise: - UBE - 3'/3' - L 2 - Elbow - flexion isometric - RTB - PT resistance - 3x10 - ER and IR isometric - RTB - PT resistance - 3x10 - seated row - 10# - 3x10  Manual Therapy (performed in reclined position on table, no supine): - GH joint mobs - PROM all directions   Modalities:  Vasopneumatic (Game Ready)    Location:  right shoulder Time:  10 minutes Pressure:  medium Temperature:  44 degrees   ASSESSMENT:   CLINICAL IMPRESSION: Patient presents to PT with continued reports of pain in her Rt shoulder. We discussed her MRI.  It does show a small tear of supraspinatus and signs of AC.  I think the main pain generator at this point is the [facility].  I suggested she seek continue to work on her HEP until she can arrange a steroid injection and then return to PT; she agrees with plan.       OBJECTIVE IMPAIRMENTS: Pain, shoulder ROM, shoulder strength   ACTIVITY LIMITATIONS: lifting, reaching, housework   PERSONAL FACTORS: See  medical history and pertinent history     REHAB POTENTIAL: Fair chronic   CLINICAL DECISION MAKING: Stable/uncomplicated   EVALUATION COMPLEXITY: Low     GOALS:     SHORT TERM GOALS: Target date: 08/08/2022   [place] will be >75% HEP compliant to improve carryover between sessions and facilitate independent management of condition   Evaluation (07/11/2022): ongoing Goal status: MET 1/30     LONG TERM GOALS: Target date: 09/05/2022   [place] will show a >/= 15 pt improvement in her QUICK DASH score (MCID is 13.4 pts or 8%) as a proxy for functional improvement    Evaluation/Baseline (07/11/2022): 35/55 pts Goal status: INITIAL     2.  [place] will self report >/= 50% decrease in pain from evaluation    Evaluation/Baseline (07/11/2022): 10/10 max pain 1/30: 10/10 max, 4/10 min 2/13: 10/10 max, 4/10 min Goal status: ongoing     3.  [place] will demonstrate >120 degrees of active ROM in flexion to allow completion of activities involving reaching [place], not limited by pain   Evaluation/Baseline (07/11/2022): 65 degrees passive, unable active 1/30: 60 AROM w/ pain 2/13: 65 AROM w/ pain Goal status: ongoing     4.  [place] will be able to complete light to moderate housework, not limited by pain   Evaluation/Baseline (07/11/2022): limited 1/30: able to do light housework (wash dishes), but no moderate housework Goal status: ongoing     PLAN: PT FREQUENCY: 1-2x/week   PT DURATION: 8 weeks (Ending 09/05/2022)   PLANNED INTERVENTIONS: Therapeutic exercises, Aquatic therapy, Therapeutic activity, Neuro Muscular re-education, Gait training, Patient/Family education, Joint mobilization, Dry Needling, Electrical stimulation, Spinal mobilization and/or manipulation, Moist heat, Taping, Vasopneumatic device, Ionotophoresis 19m/ml Dexamethasone, and Manual therapy   PLAN FOR NEXT SESSION: MT for ROM, GH joint mobs, progressive biceps loading   KMathis Dad PT 08/14/2022,  10:48 AM

## 2022-08-16 ENCOUNTER — Other Ambulatory Visit (HOSPITAL_COMMUNITY): Payer: Self-pay | Admitting: Family Medicine

## 2022-08-24 DIAGNOSIS — M7501 Adhesive capsulitis of right shoulder: Secondary | ICD-10-CM | POA: Insufficient documentation

## 2022-09-07 ENCOUNTER — Emergency Department (HOSPITAL_COMMUNITY): Payer: Medicaid Other

## 2022-09-07 ENCOUNTER — Other Ambulatory Visit: Payer: Self-pay

## 2022-09-07 ENCOUNTER — Emergency Department (HOSPITAL_COMMUNITY)
Admission: EM | Admit: 2022-09-07 | Discharge: 2022-09-07 | Disposition: A | Discharge status: Home / Self Care | Payer: Medicaid Other | Attending: Emergency Medicine | Admitting: Emergency Medicine

## 2022-09-07 ENCOUNTER — Encounter (HOSPITAL_COMMUNITY): Payer: Self-pay

## 2022-09-07 DIAGNOSIS — Z7982 Long term (current) use of aspirin: Secondary | ICD-10-CM | POA: Diagnosis not present

## 2022-09-07 DIAGNOSIS — R319 Hematuria, unspecified: Secondary | ICD-10-CM | POA: Diagnosis present

## 2022-09-07 DIAGNOSIS — Z91040 Latex allergy status: Secondary | ICD-10-CM | POA: Diagnosis not present

## 2022-09-07 DIAGNOSIS — R310 Gross hematuria: Secondary | ICD-10-CM | POA: Diagnosis not present

## 2022-09-07 LAB — URINALYSIS, ROUTINE W REFLEX MICROSCOPIC
Bilirubin Urine: NEGATIVE
Glucose, UA: 150 mg/dL — AB
Ketones, ur: NEGATIVE mg/dL
Nitrite: NEGATIVE
Protein, ur: 100 mg/dL — AB
RBC / HPF: >50 RBC/hpf (ref 0–5)
Specific Gravity, Urine: 1.02 (ref 1.005–1.030)
WBC, UA: >50 WBC/hpf (ref 0–5)
pH: 5 (ref 5.0–8.0)

## 2022-09-07 LAB — CBC WITH DIFFERENTIAL/PLATELET
Abs Immature Granulocytes: 0.03 10*3/uL (ref 0.00–0.07)
Basophils Absolute: 0 10*3/uL (ref 0.0–0.1)
Basophils Relative: 1 %
Eosinophils Absolute: 0.1 10*3/uL (ref 0.0–0.5)
Eosinophils Relative: 2 %
HCT: 39.2 % (ref 36.0–46.0)
Hemoglobin: 12.1 g/dL (ref 12.0–15.0)
Immature Granulocytes: 0 %
Lymphocytes Relative: 33 %
Lymphs Abs: 2.3 10*3/uL (ref 0.7–4.0)
MCH: 28.7 pg (ref 26.0–34.0)
MCHC: 30.9 g/dL (ref 30.0–36.0)
MCV: 92.9 fL (ref 80.0–100.0)
Monocytes Absolute: 0.5 10*3/uL (ref 0.1–1.0)
Monocytes Relative: 7 %
Neutro Abs: 3.9 10*3/uL (ref 1.7–7.7)
Neutrophils Relative %: 57 %
Platelets: 269 10*3/uL (ref 150–400)
RBC: 4.22 MIL/uL (ref 3.87–5.11)
RDW: 14.2 % (ref 11.5–15.5)
WBC: 6.8 10*3/uL (ref 4.0–10.5)
nRBC: 0 % (ref 0.0–0.2)

## 2022-09-07 NOTE — Discharge Instructions (Signed)
Your history, exam, evaluation today are consistent with bleeding in the urine called hematuria.  The urinalysis was not completely convincing for infection as you had no urinary symptoms otherwise in regards to pain or dysuria.  We feel you are safe for discharge home to follow-up with outpatient urology for the bleeding.  Your hemoglobin was normal and the CT scan did not show any blocking stones in the ureters but did show some stones in the kidneys themselves.  Please rest and stay hydrated and follow-up with them.

## 2022-09-07 NOTE — ED Provider Notes (Signed)
7:07 AM Care assumed from Dr. Tyrone Nine.  At time of transfer of care, patient is waiting for results of urinalysis to investigate the hematuria that she has had recently.  Patient had a CT scan that showed several stones in the kidneys but none in the ureters.  Hemoglobin was normal and she was not significantly anemic.  After urinalysis returns, plan of care with a discharge home to follow-up with her urologist for continued outpatient workup.  8:21 AM Urinalysis returned without any nitrites but did show leukocytes, bacteria, and some epithelial cells.  I went to reassess patient and she denies any dysuria or discomfort.  We agreed to send off a culture but hold on antibiotic treatment at this time unless something grows out.   She will follow-up with outpatient urology as per previous plan to discuss outpatient management of her hematuria.  Her hemoglobin was not anemic and patient otherwise resting comfortably.  Patient will be discharged and she agrees with plan.   Clinical Impression: 1. Gross hematuria     Disposition: Discharge  Condition: Good  I have discussed the results, Dx and Tx plan with the pt(& family if present). He/she/they expressed understanding and agree(s) with the plan. Discharge instructions discussed at great length. Strict return precautions discussed and pt &/or family have verbalized understanding of the instructions. No further questions at time of discharge.    New Prescriptions   No medications on file    Follow Up: [place] [phone] Call today discuss your visit, see when they want to see you in the office     Eula Mazzola, Gwenyth Allegra, MD 09/07/22 [phone]

## 2022-09-07 NOTE — ED Provider Notes (Signed)
[place] EMERGENCY DEPARTMENT AT [facility] Provider Note   CSN: [id number] Arrival date & time: 09/07/22  [id number]     History  Chief Complaint  Patient presents with   Hematuria    Lynn Estrada is a 62 y.o. female.  62 yo F with a chief plaints of hematuria.  This been going on for about 4 days.  She tells me that it is getting worse.  She started experiencing cramping and she felt lightheaded at times and so she decided that she had to have this evaluated immediately.  She had seen her family doctor for this who had scheduled an outpatient CT scan but she did not think she could wait any longer.  No fevers no flank pain no obvious dysuria increased frequency or hesitancy.  She is certain that this is coming from her urine not from her vagina or rectum.   Hematuria       Home Medications Prior to Admission medications   Medication Sig Start Date End Date Taking? Authorizing Provider  acetaminophen (TYLENOL) 500 MG tablet Take 500-1,000 mg by mouth every 6 (six) hours as needed (for pain.).    [provider]  aspirin 81 MG EC tablet Take 81 mg by mouth daily with lunch.    [provider]  atorvastatin (LIPITOR) 10 MG tablet Take 10 mg by mouth daily. 05/15/21   [provider]  digoxin (LANOXIN) 0.125 MG tablet Take 1 tablet (125 mcg total) by mouth daily. 05/21/22   Bensimhon, Shaune Pascal, MD  empagliflozin (JARDIANCE) 10 MG TABS tablet Take 1 tablet (10 mg total) by mouth daily. 10/02/21   [place], Maricela Bo, FNP  furosemide (LASIX) 40 MG tablet Take 40 mg by mouth daily.    [provider]  HYDROcodone-acetaminophen (NORCO) 10-325 MG tablet Take 0.5 tablets by mouth every 6 (six) hours as needed for pain. 11/17/21   [provider]  meclizine (ANTIVERT) 25 MG tablet Take 25 mg by mouth 3 (three) times daily as needed for dizziness or nausea.    [provider]  Multiple Vitamin (MULTIVITAMIN WITH MINERALS) TABS  tablet Take 1 tablet by mouth daily with lunch. One A Day for Women 50+    [provider]  sacubitril-valsartan (ENTRESTO) 24-26 MG Take 1 tablet by mouth 2 (two) times daily. 05/21/22   Bensimhon, Shaune Pascal, MD  spironolactone (ALDACTONE) 25 MG tablet TAKE 1 TABLET (25 MG TOTAL) BY MOUTH DAILY. 06/29/22   Bensimhon, Shaune Pascal, MD  traMADol (ULTRAM) 50 MG tablet Take 50 mg by mouth every 6 (six) hours as needed for moderate pain or severe pain.    [provider]      Allergies    Bactrim [sulfamethoxazole-trimethoprim], Carvedilol, Latex, and Metoprolol    Review of Systems   Review of Systems  Genitourinary:  Positive for hematuria.    Physical Exam Updated Vital Signs BP 100/74   Pulse 72   Temp (!) 97.5 F (36.4 C) (Oral)   Resp 18   Ht '6\' 2"'$  ([id number] m)   Wt 90.7 kg   SpO2 96%   BMI 25.68 kg/m  Physical Exam Vitals and nursing note reviewed.  Constitutional:      General: She is not in acute distress.    Appearance: She is well-developed. She is not diaphoretic.  HENT:     Head: Normocephalic and atraumatic.  Eyes:     Pupils: Pupils are equal, round, and reactive to light.  Cardiovascular:  Rate and Rhythm: Normal rate and regular rhythm.     Heart sounds: No murmur heard.    No friction rub. No gallop.  Pulmonary:     Effort: Pulmonary effort is normal.     Breath sounds: No wheezing or rales.  Abdominal:     General: There is no distension.     Palpations: Abdomen is soft.     Tenderness: There is no abdominal tenderness.  Musculoskeletal:        General: No tenderness.     Cervical back: Normal range of motion and neck supple.  Skin:    General: Skin is warm and dry.  Neurological:     Mental Status: She is alert and oriented to person, place, and time.  Psychiatric:        Behavior: Behavior normal.     ED Results / Procedures / Treatments   Labs (all labs ordered are listed, but only abnormal results are displayed) Labs Reviewed   CBC WITH DIFFERENTIAL/PLATELET  URINALYSIS, ROUTINE W REFLEX MICROSCOPIC    EKG None  Radiology CT Renal Stone Study  Result Date: 09/07/2022 CLINICAL DATA:  62 year old female with history of abdominal and flank pain. Gross hematuria for 4 days. Suspected kidney stone. EXAM: CT ABDOMEN AND PELVIS WITHOUT CONTRAST TECHNIQUE: Multidetector CT imaging of the abdomen and pelvis was performed following the standard protocol without IV contrast. RADIATION DOSE REDUCTION: This exam was performed according to the departmental dose-optimization program which includes automated exposure control, adjustment of the mA and/or kV according to patient size and/or use of iterative reconstruction technique. COMPARISON:  CT of the abdomen and pelvis 12/31/2016. FINDINGS: Comment: Portions of today's examination are limited by beam hardening artifact from the patient being imaged with her arms in the down position. Lower chest: Atherosclerotic calcifications in the descending thoracic aorta as well as the left circumflex coronary artery. Cardiomegaly with left ventricular dilatation. Hepatobiliary: No definite suspicious cystic or solid hepatic lesions are confidently identified on today's noncontrast CT examination. Unenhanced appearance of the gallbladder is unremarkable. Pancreas: No definite pancreatic mass or peripancreatic fluid collections or inflammatory changes are noted on today's noncontrast CT examination. Spleen: Unremarkable. Adrenals/Urinary Tract: There are multiple nonobstructive calculi in the collecting systems of both kidneys measuring up to 8 mm in the lower pole collecting system of the right kidney. No additional calculi are noted along the course of either ureter or within the lumen of the urinary bladder. No hydroureteronephrosis. Mild cortical scarring in the lower pole of the right kidney. Unenhanced appearance of the left kidney, bilateral adrenal glands and urinary bladder is otherwise  unremarkable. Stomach/Bowel: Unenhanced appearance of the stomach is normal. There is no pathologic dilatation of small bowel or colon. Normal appendix. Vascular/Lymphatic: Atherosclerosis in the abdominal aorta and pelvic vasculature. Reproductive: Status post hysterectomy. Ovaries are not confidently identified may be surgically absent or atrophic. Other: No significant volume of ascites.  No pneumoperitoneum. Musculoskeletal: There are no aggressive appearing lytic or blastic lesions noted in the visualized portions of the skeleton. IMPRESSION: 1. Numerous nonobstructive calculi in the collecting systems of both kidneys measuring up to 8 mm in the lower pole collecting system of the right kidney. No ureteral stones or findings of urinary tract obstruction are noted at this time. 2. No other acute findings are noted elsewhere in the abdomen or pelvis. 3. Cardiomegaly with left ventricular dilatation. 4. Aortic atherosclerosis, in addition to at least left circumflex coronary artery disease. Please note that although the presence of  coronary artery calcium documents the presence of coronary artery disease, the severity of this disease and any potential stenosis cannot be assessed on this non-gated CT examination. Assessment for potential risk factor modification, dietary therapy or pharmacologic therapy may be warranted, if clinically indicated. Electronically Signed   By: Vinnie Langton M.D.   On: 09/07/2022 06:49    Procedures Procedures    Medications Ordered in ED Medications - No data to display  ED Course/ Medical Decision Making/ A&P                             Medical Decision Making Amount and/or Complexity of Data Reviewed Labs: ordered. Radiology: ordered.   62 yo F with a chief complaint of hematuria.  Going on for about 4 days.  She tells me its similar to when she had a kidney stone in the past.  Will obtain a CT stone study.  Check a CBC.  UA.  Reassess.  CBC without anemia, CT  stone study without ureterolithiasis.  No obvious cause for her blood in her urine.  Patient care was signed out to Dr. Sherry Ruffing, please see his note for further details of care in the ED.  The patients results and plan were reviewed and discussed.   Any x-rays performed were independently reviewed by myself.   Differential diagnosis were considered with the presenting HPI.  Medications - No data to display  Vitals:   09/07/22 0529 09/07/22 0531 09/07/22 0635 09/07/22 0700  BP: 105/75  99/72 100/74  Pulse: 85  75 72  Resp: 17   18  Temp: (!) 97.5 F (36.4 C)     TempSrc: Oral     SpO2: 100%  100% 96%  Weight:  90.7 kg    Height:  '6\' 2"'$  (1.88 m)      Final diagnoses:  Gross hematuria    Admission/ observation were discussed with the admitting physician, patient and/or family and they are comfortable with the plan.          Final Clinical Impression(s) / ED Diagnoses Final diagnoses:  Gross hematuria    Rx / DC Orders ED Discharge Orders     None         Deno Etienne, DO 09/07/22 [id number]

## 2022-09-07 NOTE — ED Notes (Signed)
Report from [place]

## 2022-09-07 NOTE — ED Triage Notes (Signed)
Patient reports hematuria for about 4 days. States she had went to PCP and had culture of urine but had not heard any results and it keeps getting worse. Was not prescribed any abx.

## 2022-09-09 LAB — URINE CULTURE: Culture: >=100000 — AB

## 2022-09-10 ENCOUNTER — Telehealth (HOSPITAL_BASED_OUTPATIENT_CLINIC_OR_DEPARTMENT_OTHER): Payer: Self-pay | Admitting: Emergency Medicine

## 2022-09-10 NOTE — Telephone Encounter (Signed)
Post ED Visit - Positive Culture Follow-up: Successful Patient Follow-Up  Culture assessed and recommendations reviewed by:  '[]'$  Elenor Quinones, Pharm.D. '[]'$  Heide Guile, Pharm.D., BCPS AQ-ID '[]'$  Parks Neptune, Pharm.D., BCPS '[x]'$  Alycia Rossetti, Pharm.D., BCPS '[]'$  [place], Pharm.D., BCPS, AAHIVP '[]'$  Legrand Como, Pharm.D., BCPS, AAHIVP '[]'$  Salome Arnt, PharmD, BCPS '[]'$  Johnnette Gourd, PharmD, BCPS '[]'$  Hughes Better, PharmD, BCPS '[]'$  Leeroy Cha, PharmD  Positive urine culture  '[]'$  Patient discharged without antimicrobial prescription and treatment is now indicated '[]'$  Organism is resistant to prescribed ED discharge antimicrobial '[]'$  Patient with positive blood cultures  Changes discussed with ED provider: Ashok Cordia New antibiotic prescription symptom check, if urinary symptoms or fever start Keflex '500mg'$  po bid x 7 days Pt states asymptomatic, hematuria has resolved, attempting to get followup appt with urologist, awaiting call  No need for further treatment   Hazle Nordmann 09/10/2022, 12:35 PM

## 2022-09-10 NOTE — Progress Notes (Signed)
ED Antimicrobial Stewardship Positive Culture Follow Up   Lynn Estrada is an 62 y.o. female who presented to [facility] on 09/07/2022 with a chief complaint of  Chief Complaint  Patient presents with   Hematuria    Recent Results (from the past 720 hour(s))  Urine Culture     Status: Abnormal   Collection Time: 09/07/22  5:34 AM   Specimen: Urine, Clean Catch  Result Value Ref Range Status   Specimen Description   Final    URINE, CLEAN CATCH Performed at [facility], [place] [address] [place], [place] [address]    Special Requests   Final    NONE Performed at [facility], [place] [address], [place], [place] [address]    Culture >=100,000 COLONIES/mL KLEBSIELLA PNEUMONIAE (A)  Final   Report Status 09/09/2022 FINAL  Final   Organism ID, Bacteria KLEBSIELLA PNEUMONIAE (A)  Final      Susceptibility   Klebsiella pneumoniae - MIC*    AMPICILLIN RESISTANT Resistant     CEFAZOLIN <=4 SENSITIVE Sensitive     CEFEPIME <=0.12 SENSITIVE Sensitive     CEFTRIAXONE <=0.25 SENSITIVE Sensitive     CIPROFLOXACIN <=0.25 SENSITIVE Sensitive     GENTAMICIN <=1 SENSITIVE Sensitive     IMIPENEM 0.5 SENSITIVE Sensitive     NITROFURANTOIN 64 INTERMEDIATE Intermediate     TRIMETH/SULFA <=20 SENSITIVE Sensitive     AMPICILLIN/SULBACTAM 4 SENSITIVE Sensitive     PIP/TAZO <=4 SENSITIVE Sensitive     * >=100,000 COLONIES/mL KLEBSIELLA PNEUMONIAE    '[x]'$  Need additional follow-up  16 YOF who presented on 3/8 with complaints of hematuria, Renal CT showed numerous non-obstructing calculi, no dysuria/flank pain noted. Afeb, WBC wnl, UA +WBC however 11-20 squamous cells concerning for poor sample. The patient grew out Klebsiella PNA which she has been known to grow out before - possibly a colonizer for her.   Discussed with the MD and plan is to call for a symptom check: - If still symptomatic of UTI (dysuria, burning with urination, etc) >> prescribe Keflex 500 mg  po bid x 7 days - If not symptomatic and/or improving/resolving >> no antibiotic therapy is indicated at this time. Continue with the plan to follow-up with PCP/urology outpatient  ED Provider: Lajean Saver, MD   Alycia Rossetti, PharmD, BCPS  09/10/2022, 8:55 AM Clinical Pharmacist Monday - Friday phone -  [phone] Saturday - Sunday phone - [phone]

## 2022-09-11 ENCOUNTER — Ambulatory Visit: Payer: Medicaid Other | Attending: Physician Assistant | Admitting: Physical Therapy

## 2022-09-11 ENCOUNTER — Encounter: Payer: Self-pay | Admitting: Physical Therapy

## 2022-09-11 DIAGNOSIS — R600 Localized edema: Secondary | ICD-10-CM | POA: Diagnosis present

## 2022-09-11 DIAGNOSIS — M25511 Pain in right shoulder: Secondary | ICD-10-CM | POA: Diagnosis present

## 2022-09-11 DIAGNOSIS — M6281 Muscle weakness (generalized): Secondary | ICD-10-CM | POA: Insufficient documentation

## 2022-09-11 DIAGNOSIS — G8929 Other chronic pain: Secondary | ICD-10-CM | POA: Insufficient documentation

## 2022-09-11 NOTE — Therapy (Signed)
OUTPATIENT PHYSICAL THERAPY TREATMENT NOTE   Patient Name: Lynn Estrada MRN: [id number] DOB:[birth year], 62 y.o., female Today's Date: 09/11/2022  PCP: [name], PA  REFERRING PROVIDER: Wendall Mola, PA-C   END OF SESSION:   PT End of Session - 09/11/22 1049     Visit Number 10    Number of Visits 11    Date for PT Re-Evaluation 11/06/22    Authorization Type [employer] [place] - Jeanie Cooks    Authorization Time Period Approved 10 visits 07/11/22-09/09/22 (requesting new auth 3/12)    Authorization - Number of Visits 10    PT Start Time [id number]    PT Stop Time [id number]    PT Time Calculation (min) 41 min    Activity Tolerance Patient tolerated treatment well;Patient limited by pain    Behavior During Therapy [facility] for tasks assessed/performed                Past Medical History:  Diagnosis Date   Anemia    Eczema    GERD (gastroesophageal reflux disease)    History of adenomatous polyp of colon    08-03-2016  tubular adenoma x2 and hyperplastic   History of chronic gastritis 08/03/2016   History of ectopic pregnancy 1988   s/p  left salpingectomy   History of endometriosis    History of kidney stones    History of partial nephrectomy    right pelvis for very large stone   History of pneumonia 07/02/2016   CAP   History of sepsis    07-01-2016  sepsis w/ pyelonephritis, CAP /   12-31-2016  urosepsis w/ kidney stone obstruction   History of uterine leiomyoma    Hypertension    Left ureteral stone    Nephrolithiasis    left obstructive stone and right non-obstructive stone  per CT 12-31-2016   Urgency of urination    Past Surgical History:  Procedure Laterality Date   ABDOMINAL HYSTERECTOMY  01/20/2007   w/ Lysis Adhesions/  Left salpingoophorectomy/  Right Salpingectomy   CHOLECYSTECTOMY  1994   and Right Partial Nephrectomy (pelvis for very large stone)   CYSTOSCOPY W/ URETERAL STENT PLACEMENT Left 12/31/2016   Procedure: CYSTOSCOPY WITH RETROGRADE  PYELOGRAM/ LEFT URETERAL STENT PLACEMENT;  Surgeon: Alexis Frock, MD;  Location: [facility];  Service: Urology;  Laterality: Left;   CYSTOSCOPY WITH RETROGRADE PYELOGRAM, URETEROSCOPY AND STENT PLACEMENT Left 01/18/2017   Procedure: 1ST STAGE CYSTOSCOPY WITH RETROGRADE PYELOGRAM, URETEROSCOPY AND STENT REPLACEMENT;  Surgeon: Alexis Frock, MD;  Location: [facility];  Service: Urology;  Laterality: Left;   CYSTOSCOPY WITH RETROGRADE PYELOGRAM, URETEROSCOPY AND STENT PLACEMENT Left 02/01/2017   Procedure: 2ND STAGE CYSTOSCOPY WITH RETROGRADE PYELOGRAM, URETEROSCOPY AND STENT REPLACEMENT;  Surgeon: Alexis Frock, MD;  Location: [facility];  Service: Urology;  Laterality: Left;   ECTOPIC PREGNANCY SURGERY  1988   Left Salpingectomy   HOLMIUM LASER APPLICATION Left [id number]   Procedure: HOLMIUM LASER APPLICATION;  Surgeon: Alexis Frock, MD;  Location: [facility];  Service: Urology;  Laterality: Left;   HOLMIUM LASER APPLICATION Left [id number]   Procedure: HOLMIUM LASER APPLICATION;  Surgeon: Alexis Frock, MD;  Location: [facility];  Service: Urology;  Laterality: Left;   LUMBAR DISC SURGERY  01/1999   right L5 -- S1   RE-EXPLORATION LUMBAR/  LAMINECTOMY AND MICRODISKECTOMY  10/14/2001   right L5 -- S1   RIGHT HEART CATH N/A 05/23/2022   Procedure: RIGHT HEART CATH;  Surgeon: Jolaine Artist,  MD;  Location: [facility];  Service: Cardiovascular;  Laterality: N/A;   RIGHT/LEFT HEART CATH AND CORONARY ANGIOGRAPHY N/A 07/18/2021   Procedure: RIGHT/LEFT HEART CATH AND CORONARY ANGIOGRAPHY;  Surgeon: Jolaine Artist, MD;  Location: [facility];  Service: Cardiovascular;  Laterality: N/A;   TUBAL LIGATION Bilateral [id number]   PPTL   UMBILICAL HERNIA REPAIR  age 5   Patient Active Problem List   Diagnosis Date Noted   LBBB (left bundle branch block) 02/01/2022   Chronic combined systolic and diastolic heart  failure ([place]) 07/27/2021   Acute combined systolic (congestive) and diastolic (congestive) heart failure ([facility])    Hypocalcemia 07/12/2021   Elevated troponin 07/12/2021   Suspected COVID-19 virus infection 06/11/2019   Acute respiratory failure with hypoxia ([place]) 06/11/2019   Multifocal pneumonia 06/11/2019   Ureteral stone with hydronephrosis 12/31/2016   Normocytic anemia 07/10/2016   Essential hypertension 07/26/2015   Eczema 07/26/2015   Overweight (BMI 25.0-29.9) 07/26/2015    REFERRING DIAG: right shoulder pain   THERAPY DIAG:  Chronic right shoulder pain  Muscle weakness  Localized edema  Rationale for Evaluation and Treatment Rehabilitation  PERTINENT HISTORY: Heart failure (does not tolerate reclined positions), history of heart attack   PRECAUTIONS: Heart failure (does not tolerate reclined positions); Adhesive capsulitis (?)  SUBJECTIVE:                                                                                                                                                                                      SUBJECTIVE STATEMENT:  Pt states that she got a steroid injection which has helped her pain.  She rates her pain currently at 3/10   PAIN:  Are you having pain? 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                               SENSATION:          Light touch: Appears intact           PALPATION: Exquisite TTP R anterior shoulder and LH biceps tendon   UPPER EXTREMITY AROM:   ROM Right 07/11/2022 Left 07/11/2022 R 3/12  Shoulder  flexion 65 135 82  Shoulder  abduction 65 135 80  Shoulder internal rotation       Shoulder external rotation       Functional IR To greater troch T1   Functional ER To ear* T4*   Shoulder extension       Elbow extension       Elbow flexion         (Blank rows = not tested, N = WNL, * = concordant pain with testing)   UPPER EXTREMITY MMT:   MMT Right 07/11/2022 Left 07/11/2022  Shoulder flexion Unable d/t pain    Shoulder abduction (C5) Unable d/t pain    Shoulder ER 4 4  Shoulder IR 3* 4  Middle trapezius      Lower trapezius      Shoulder extension      Grip strength      Cervical flexion (C1,C2)      Cervical S/B (C3)      Shoulder shrug (C4)      Elbow flexion (C6) 3*    Elbow ext (C7)      Thumb ext (C8)      Finger abd (T1)      Grossly        (Blank rows = not tested, score listed is out of 5 possible points.  N = WNL, D = diminished, C = clear for gross weakness with myotome testing, * = concordant pain with testing)   UPPER EXTREMITY PROM:   PROM Right 07/11/2022 Left 07/11/2022 R 2/13  Shoulder flexion 90   95  Shoulder abduction 85   85  Shoulder internal rotation       Shoulder external rotation at 45 ~30 degrees   20  Functional IR       Functional ER       Shoulder extension       Elbow extension       Elbow flexion         (Blank rows = not tested, N = WNL, * = concordant pain with testing)   SHOULDER SPECIAL TESTS:           RC test cluster: + painful arc, - ER, - drop arm           Yergason's Test (+)     PATIENT SURVEYS:  Quick Dash 36/55              TODAY'S TREATMENT:  Creating, reviewing, and completing below HEP     PATIENT EDUCATION:  POC, diagnosis, prognosis, HEP, and outcome measures.  Pt educated via explanation, demonstration, and handout (HEP).  Pt confirms understanding verbally.    HOME EXERCISE PROGRAM: Access Code: [email or web address] URL: [email or web address] Date: 07/11/2022 Prepared by: Shearon Balo    Exercises - Seated Isometric Elbow Flexion  - 3 x daily - 7 x weekly - 3 sets - 10 reps - 3'' hold   TREATMENT 09/11/22:  REEVAL (not other charges this visit)  Therapeutic Exercise: - UBE - 3'/3' - L 2  Manual Therapy (performed in reclined position on table, no supine): - GH joint mobs - PROM all directions with inferior and posterior glide  Modalities: Vasopneumatic (Game Ready)   Location:  right shoulder Time:  10 minutes Pressure:  medium Temperature:  44 degrees  TREATMENT 08/14/22: Therapeutic Exercise: - UBE - 3'/3' - L 2 - Elbow - flexion isometric - RTB - therapist resistance - 2x10 - ER and IR isometric - RTB - therapist resistance - 3x10  Manual Therapy (performed in  reclined position on table, no supine): - GH joint mobs - PROM all directions  Modalities: Vasopneumatic (Game Ready)   Location:  right shoulder Time:  10 minutes Pressure:  medium Temperature:  44 degrees   TREATMENT 08/07/22: RE-EVAL  Therapeutic Exercise (not billed: - UBE - 3'/3' - L 2 - Elbow - flexion isometric - RTB - therapist resistance - 2x10 - ER and IR isometric - RTB - therapist resistance - 3x10 - seated row - 10# - 3x10  Manual Therapy (performed in reclined position on table, no supine): - GH joint mobs - PROM all directions  Modalities: Vasopneumatic (Game Ready)   Location:  right shoulder Time:  10 minutes Pressure:  medium Temperature:  44 degrees   DAILY TREATMENT: Therapeutic Exercise: - UBE - 3'/3' - L 2 - Elbow - flexion isometric - RTB - PT resistance - 3x10 - ER and IR isometric - RTB - PT resistance - 3x10 - seated row - 10# - 3x10  Manual Therapy (performed in reclined position on table, no supine): - GH joint mobs - PROM all directions   Modalities:  Vasopneumatic (Game Ready)    Location:  right shoulder Time:  10 minutes Pressure:  medium Temperature:  44 degrees   ASSESSMENT:   CLINICAL IMPRESSION: [place] returns for PT  re-eval today after absence waiting for steroid injection.  She has had some improvement in her shoulder since the injection and shows significantly improved AROM compared to previous visit as well as a reduction in pain.  She is now able to complete most housework, although this still causes pain as high as 8/10.  She will benefit from continued PT following her injection to maximize ROM and function ability.  She is much more tolerant of manual today.  Please see GOALS section for progress on short term and long term goals established at evaluation.  I recommend continuation of PT to allow completion of remaining goals and continued functional progression.     OBJECTIVE IMPAIRMENTS: Pain, shoulder ROM, shoulder strength   ACTIVITY LIMITATIONS: lifting, reaching, housework   PERSONAL FACTORS: See medical history and pertinent history     REHAB POTENTIAL: Fair chronic   CLINICAL DECISION MAKING: Stable/uncomplicated   EVALUATION COMPLEXITY: Low     GOALS:     SHORT TERM GOALS: Target date: 08/08/2022   Cathy will be >75% HEP compliant to improve carryover between sessions and facilitate independent management of condition   Evaluation (07/11/2022): ongoing Goal status: MET 1/30     LONG TERM GOALS: Target date: 09/05/2022 (extended to 11/06/2022)   [place] will show a >/= 15 pt improvement in her QUICK DASH score (MCID is 13.4 pts or 8%) as a proxy for functional improvement    Evaluation/Baseline (07/11/2022): 35/55 pts 3/12: QuickDASH Score: 34.1 / 100 = 34.1 % Goal status: INITIAL     2.  [place] will self report >/= 50% decrease in pain from evaluation    Evaluation/Baseline (07/11/2022): 10/10 max pain 1/30: 10/10 max, 4/10 min 2/13: 10/10 max, 4/10 min 3/12: 8/10 max, 3/10 min Goal status: ongoing     3.  [place] will demonstrate >120 degrees of active ROM in flexion to allow completion of activities involving reaching [place], not limited by pain   Evaluation/Baseline  (07/11/2022): 65 degrees passive, unable active 1/30: 60 AROM w/ pain 2/13: 65 AROM w/ pain 3/12: 84 AROM w/ pain Goal status: ongoing     4.  [place] will be able to complete light to moderate  housework, not limited by pain   Evaluation/Baseline (07/11/2022): limited 1/30: able to do light housework (wash dishes), but no moderate housework 3/12: still limited in moderate to heavy housework Goal status: ongoing     PLAN: PT FREQUENCY: 1-2x/week   PT DURATION: 8 weeks (Ending 11/06/2022 )   PLANNED INTERVENTIONS: Therapeutic exercises, Aquatic therapy, Therapeutic activity, Neuro Muscular re-education, Gait training, Patient/Family education, Joint mobilization, Dry Needling, Electrical stimulation, Spinal mobilization and/or manipulation, Moist heat, Taping, Vasopneumatic device, Ionotophoresis '4mg'$ /ml Dexamethasone, and Manual therapy   PLAN FOR NEXT SESSION: MT for ROM, GH joint mobs, progressive biceps loading   Mathis Dad, PT 09/11/2022, 11:30 AM

## 2022-09-19 ENCOUNTER — Telehealth (HOSPITAL_COMMUNITY): Payer: Self-pay

## 2022-09-19 NOTE — Telephone Encounter (Signed)
Forms awaiting MD signature

## 2022-09-24 NOTE — Telephone Encounter (Signed)
Forms faxed on 09/21/22, my chart message sent to patient

## 2022-09-26 NOTE — Therapy (Signed)
OUTPATIENT PHYSICAL THERAPY TREATMENT NOTE   Patient Name: Lynn Estrada MRN: [id number] DOB:Oct 08, 1960, 62 y.o., female Today's Date: 09/27/2022  PCP: [name], PA  REFERRING PROVIDER: Wendall Mola, PA-C   END OF SESSION:   PT End of Session - 09/27/22 0816     Visit Number 11    Date for PT Re-Evaluation 11/06/22    Authorization Type [employer] [place] - Jeanie Cooks    Authorization Time Period 8 visits approved 09/11/22-11/23/22    Authorization - Visit Number 2    Authorization - Number of Visits 8    PT Start Time 0825    PT Stop Time 0915    PT Time Calculation (min) 50 min    Activity Tolerance Patient tolerated treatment well;Patient limited by pain    Behavior During Therapy [facility] for tasks assessed/performed              Past Medical History:  Diagnosis Date   Anemia    Eczema    GERD (gastroesophageal reflux disease)    History of adenomatous polyp of colon    08-03-2016  tubular adenoma x2 and hyperplastic   History of chronic gastritis 08/03/2016   History of ectopic pregnancy 1988   s/p  left salpingectomy   History of endometriosis    History of kidney stones    History of partial nephrectomy    right pelvis for very large stone   History of pneumonia 07/02/2016   CAP   History of sepsis    07-01-2016  sepsis w/ pyelonephritis, CAP /   12-31-2016  urosepsis w/ kidney stone obstruction   History of uterine leiomyoma    Hypertension    Left ureteral stone    Nephrolithiasis    left obstructive stone and right non-obstructive stone  per CT 12-31-2016   Urgency of urination    Past Surgical History:  Procedure Laterality Date   ABDOMINAL HYSTERECTOMY  01/20/2007   w/ Lysis Adhesions/  Left salpingoophorectomy/  Right Salpingectomy   CHOLECYSTECTOMY  1994   and Right Partial Nephrectomy (pelvis for very large stone)   CYSTOSCOPY W/ URETERAL STENT PLACEMENT Left 12/31/2016   Procedure: CYSTOSCOPY WITH RETROGRADE PYELOGRAM/ LEFT URETERAL STENT  PLACEMENT;  Surgeon: Alexis Frock, MD;  Location: [facility];  Service: Urology;  Laterality: Left;   CYSTOSCOPY WITH RETROGRADE PYELOGRAM, URETEROSCOPY AND STENT PLACEMENT Left 01/18/2017   Procedure: 1ST STAGE CYSTOSCOPY WITH RETROGRADE PYELOGRAM, URETEROSCOPY AND STENT REPLACEMENT;  Surgeon: Alexis Frock, MD;  Location: [facility];  Service: Urology;  Laterality: Left;   CYSTOSCOPY WITH RETROGRADE PYELOGRAM, URETEROSCOPY AND STENT PLACEMENT Left 02/01/2017   Procedure: 2ND STAGE CYSTOSCOPY WITH RETROGRADE PYELOGRAM, URETEROSCOPY AND STENT REPLACEMENT;  Surgeon: Alexis Frock, MD;  Location: [facility];  Service: Urology;  Laterality: Left;   ECTOPIC PREGNANCY SURGERY  1988   Left Salpingectomy   HOLMIUM LASER APPLICATION Left [id number]   Procedure: HOLMIUM LASER APPLICATION;  Surgeon: Alexis Frock, MD;  Location: [facility];  Service: Urology;  Laterality: Left;   HOLMIUM LASER APPLICATION Left [id number]   Procedure: HOLMIUM LASER APPLICATION;  Surgeon: Alexis Frock, MD;  Location: [facility];  Service: Urology;  Laterality: Left;   LUMBAR DISC SURGERY  01/1999   right L5 -- S1   RE-EXPLORATION LUMBAR/  LAMINECTOMY AND MICRODISKECTOMY  10/14/2001   right L5 -- S1   RIGHT HEART CATH N/A 05/23/2022   Procedure: RIGHT HEART CATH;  Surgeon: Jolaine Artist, MD;  Location: [facility];  Service: Cardiovascular;  Laterality: N/A;   RIGHT/LEFT HEART CATH AND CORONARY ANGIOGRAPHY N/A 07/18/2021   Procedure: RIGHT/LEFT HEART CATH AND CORONARY ANGIOGRAPHY;  Surgeon: Jolaine Artist, MD;  Location: [facility];  Service: Cardiovascular;  Laterality: N/A;   TUBAL LIGATION Bilateral [id number]   PPTL   UMBILICAL HERNIA REPAIR  age 34   Patient Active Problem List   Diagnosis Date Noted   LBBB (left bundle branch block) 02/01/2022   Chronic combined systolic and diastolic heart failure ([place]) 07/27/2021   Acute  combined systolic (congestive) and diastolic (congestive) heart failure ([facility])    Hypocalcemia 07/12/2021   Elevated troponin 07/12/2021   Suspected COVID-19 virus infection 06/11/2019   Acute respiratory failure with hypoxia ([place]) 06/11/2019   Multifocal pneumonia 06/11/2019   Ureteral stone with hydronephrosis 12/31/2016   Normocytic anemia 07/10/2016   Essential hypertension 07/26/2015   Eczema 07/26/2015   Overweight (BMI 25.0-29.9) 07/26/2015    REFERRING DIAG: right shoulder pain   THERAPY DIAG:  Chronic right shoulder pain  Muscle weakness  Localized edema  Rationale for Evaluation and Treatment Rehabilitation  PERTINENT HISTORY: Heart failure (does not tolerate reclined positions), history of heart attack   PRECAUTIONS: Heart failure (does not tolerate reclined positions); Adhesive capsulitis (?)  SUBJECTIVE:                                                                                                                                                                                      SUBJECTIVE STATEMENT:  Patient reports that she feels like the injection is beginning to wear off. She states that she aggravated the pain yesterday when lifting boxes at work.    PAIN:  Are you having pain? 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                               SENSATION:          Light touch: Appears intact           PALPATION: Exquisite TTP R anterior shoulder and LH biceps tendon   UPPER EXTREMITY AROM:   ROM Right 07/11/2022 Left 07/11/2022 R 3/12   Shoulder flexion 65 135 82  Shoulder abduction 65 135 80  Shoulder internal rotation       Shoulder external rotation       Functional IR To greater troch T1   Functional ER To ear* T4*   Shoulder extension       Elbow extension       Elbow flexion         (Blank rows = not tested, N = WNL, * = concordant pain with testing)   UPPER EXTREMITY MMT:   MMT Right 07/11/2022 Left 07/11/2022  Shoulder flexion Unable d/t pain    Shoulder abduction (C5) Unable d/t pain    Shoulder ER 4 4  Shoulder IR 3* 4  Middle trapezius      Lower trapezius      Shoulder extension      Grip strength      Cervical flexion (C1,C2)      Cervical S/B (C3)      Shoulder shrug (C4)      Elbow flexion (C6) 3*    Elbow ext (C7)      Thumb ext (C8)      Finger abd (T1)      Grossly        (Blank rows = not tested, score listed is out of 5 possible points.  N = WNL, D = diminished, C = clear for gross weakness with myotome testing, * = concordant pain with testing)   UPPER EXTREMITY PROM:   PROM Right 07/11/2022 Left 07/11/2022 R 2/13  Shoulder flexion 90   95  Shoulder abduction 85   85  Shoulder internal rotation       Shoulder external rotation at 45 ~30 degrees   20  Functional IR       Functional ER       Shoulder extension       Elbow extension       Elbow flexion         (Blank rows = not tested, N = WNL, * = concordant pain with testing)   SHOULDER SPECIAL TESTS:           RC test cluster: + painful arc, - ER, - drop arm           Yergason's Test (+)     PATIENT SURVEYS:  Quick Dash 36/55              TODAY'S TREATMENT:  Creating, reviewing, and completing below HEP     PATIENT EDUCATION:  POC, diagnosis, prognosis, HEP, and outcome measures.  Pt educated via explanation, demonstration, and handout (HEP).  Pt confirms understanding verbally.    HOME EXERCISE PROGRAM: Access Code: [email or web address] URL: [email or web address] Date: 07/11/2022 Prepared by: Shearon Balo   Exercises - Seated Isometric Elbow Flexion  - 3 x daily - 7 x weekly - 3 sets - 10 reps - 3'' hold   TREATMENT 09/27/22: Therapeutic Exercise: - UBE - 2'/2' - L 2 - Rows RTB 2x10 - Seated double ER with scap retraction YTB 2x10 - Seated elbow flexion 500g ball 2x10  Manual Therapy (performed in reclined position on table, no supine): - GH joint mobs - PROM all directions with inferior and posterior glide  Modalities: Vasopneumatic (Game Ready)   Location:  right shoulder Time:  10 minutes Pressure:  medium Temperature:  44 degrees   TREATMENT 09/11/22:  REEVAL (not other charges this visit)  Therapeutic Exercise: - UBE - 3'/3' - L 2  Manual Therapy (performed in reclined position  on table, no supine): - GH joint mobs - PROM all directions with inferior and posterior glide  Modalities: Vasopneumatic (Game Ready)   Location:  right shoulder Time:  10 minutes Pressure:  medium Temperature:  44 degrees  TREATMENT 08/14/22: Therapeutic Exercise: - UBE - 3'/3' - L 2 - Elbow - flexion isometric - RTB - therapist resistance - 2x10 - ER and IR isometric - RTB - therapist resistance - 3x10  Manual Therapy (performed in reclined position on table, no supine): - GH joint mobs - PROM all directions  Modalities: Vasopneumatic (Game Ready)   Location:  right shoulder Time:  10 minutes Pressure:  medium Temperature:  44 degrees     ASSESSMENT:   CLINICAL IMPRESSION: Patient presents to PT reporting increased pain today that she attributes to lifting a 50lb box at work yesterday and that she feels like the steroid injection is beginning to wear off. Session today focused on progressive resistive strengthening of biceps and periscapular. Manual techniques utilized along with Vasopneumatic to decrease tension. Patient continues to benefit from skilled PT services and should be progressed as able to improve functional independence.      OBJECTIVE  IMPAIRMENTS: Pain, shoulder ROM, shoulder strength   ACTIVITY LIMITATIONS: lifting, reaching, housework   PERSONAL FACTORS: See medical history and pertinent history     REHAB POTENTIAL: Fair chronic   CLINICAL DECISION MAKING: Stable/uncomplicated   EVALUATION COMPLEXITY: Low     GOALS:     SHORT TERM GOALS: Target date: 08/08/2022   Alissah will be >75% HEP compliant to improve carryover between sessions and facilitate independent management of condition   Evaluation (07/11/2022): ongoing Goal status: MET 1/30     LONG TERM GOALS: Target date: 09/05/2022 (extended to 11/06/2022)   [place] will show a >/= 15 pt improvement in her QUICK DASH score (MCID is 13.4 pts or 8%) as a proxy for functional improvement    Evaluation/Baseline (07/11/2022): 35/55 pts 3/12: QuickDASH Score: 34.1 / 100 = 34.1 % Goal status: INITIAL     2.  [place] will self report >/= 50% decrease in pain from evaluation    Evaluation/Baseline (07/11/2022): 10/10 max pain 1/30: 10/10 max, 4/10 min 2/13: 10/10 max, 4/10 min 3/12: 8/10 max, 3/10 min Goal status: ongoing     3.  [place]on will demonstrate >120 degrees of active ROM in flexion to allow completion of activities involving reaching [place], not limited by pain   Evaluation/Baseline (07/11/2022): 65 degrees passive, unable active 1/30: 60 AROM w/ pain 2/13: 65 AROM w/ pain 3/12: 84 AROM w/ pain Goal status: ongoing     4.  Zuzanna will be able to complete light to moderate housework, not limited by pain   Evaluation/Baseline (07/11/2022): limited 1/30: able to do light housework (wash dishes), but no moderate housework 3/12: still limited in moderate to heavy housework Goal status: ongoing     PLAN: PT FREQUENCY: 1-2x/week   PT DURATION: 8 weeks (Ending 11/06/2022 )   PLANNED INTERVENTIONS: Therapeutic exercises, Aquatic therapy, Therapeutic activity, Neuro Muscular re-education, Gait training, Patient/Family education, Joint  mobilization, Dry Needling, Electrical stimulation, Spinal mobilization and/or manipulation, Moist heat, Taping, Vasopneumatic device, Ionotophoresis 4mg /ml Dexamethasone, and Manual therapy   PLAN FOR NEXT SESSION: MT for ROM, GH joint mobs, progressive biceps loading   Margarette Canada, PTA 09/27/2022, 9:10 AM

## 2022-09-27 ENCOUNTER — Ambulatory Visit: Payer: Medicaid Other

## 2022-09-27 DIAGNOSIS — M6281 Muscle weakness (generalized): Secondary | ICD-10-CM

## 2022-09-27 DIAGNOSIS — M25511 Pain in right shoulder: Secondary | ICD-10-CM | POA: Diagnosis not present

## 2022-09-27 DIAGNOSIS — G8929 Other chronic pain: Secondary | ICD-10-CM

## 2022-09-27 DIAGNOSIS — R600 Localized edema: Secondary | ICD-10-CM

## 2022-09-28 NOTE — Telephone Encounter (Signed)
Patient called and states Lynn Estrada did not receive forms and they must have by next week. She asked if they could be resent and call her to let her know it has been done.

## 2022-09-28 NOTE — Telephone Encounter (Signed)
She called back and just received email that they received paperwork so disregard previous message

## 2022-10-02 ENCOUNTER — Ambulatory Visit: Payer: Medicaid Other | Attending: Physician Assistant | Admitting: Physical Therapy

## 2022-10-02 ENCOUNTER — Encounter: Payer: Self-pay | Admitting: Physical Therapy

## 2022-10-02 DIAGNOSIS — M25511 Pain in right shoulder: Secondary | ICD-10-CM | POA: Insufficient documentation

## 2022-10-02 DIAGNOSIS — R600 Localized edema: Secondary | ICD-10-CM | POA: Insufficient documentation

## 2022-10-02 DIAGNOSIS — G8929 Other chronic pain: Secondary | ICD-10-CM | POA: Diagnosis present

## 2022-10-02 DIAGNOSIS — M6281 Muscle weakness (generalized): Secondary | ICD-10-CM | POA: Diagnosis present

## 2022-10-02 NOTE — Therapy (Signed)
OUTPATIENT PHYSICAL THERAPY TREATMENT NOTE   Patient Name: Lynn Estrada MRN: [id number] DOB:1960/11/09, 62 y.o., female Today's Date: 10/02/2022  PCP: [name], PA  REFERRING PROVIDER: Wendall Mola, PA-C   END OF SESSION:   PT End of Session - 10/02/22 1043     Visit Number 12    Date for PT Re-Evaluation 11/06/22    Authorization Type [employer] [place] - Jeanie Cooks    Authorization Time Period Approved 10 visits 07/11/22-09/09/22    8 visits approved 09/11/22-11/23/22    Authorization - Visit Number 3    Authorization - Number of Visits 8    PT Start Time [id number]    PT Stop Time [id number]    PT Time Calculation (min) 41 min    Activity Tolerance Patient tolerated treatment well;Patient limited by pain    Behavior During Therapy [facility] for tasks assessed/performed              Past Medical History:  Diagnosis Date   Anemia    Eczema    GERD (gastroesophageal reflux disease)    History of adenomatous polyp of colon    08-03-2016  tubular adenoma x2 and hyperplastic   History of chronic gastritis 08/03/2016   History of ectopic pregnancy 1988   s/p  left salpingectomy   History of endometriosis    History of kidney stones    History of partial nephrectomy    right pelvis for very large stone   History of pneumonia 07/02/2016   CAP   History of sepsis    07-01-2016  sepsis w/ pyelonephritis, CAP /   12-31-2016  urosepsis w/ kidney stone obstruction   History of uterine leiomyoma    Hypertension    Left ureteral stone    Nephrolithiasis    left obstructive stone and right non-obstructive stone  per CT 12-31-2016   Urgency of urination    Past Surgical History:  Procedure Laterality Date   ABDOMINAL HYSTERECTOMY  01/20/2007   w/ Lysis Adhesions/  Left salpingoophorectomy/  Right Salpingectomy   CHOLECYSTECTOMY  1994   and Right Partial Nephrectomy (pelvis for very large stone)   CYSTOSCOPY W/ URETERAL STENT PLACEMENT Left 12/31/2016   Procedure: CYSTOSCOPY WITH  RETROGRADE PYELOGRAM/ LEFT URETERAL STENT PLACEMENT;  Surgeon: Alexis Frock, MD;  Location: [facility];  Service: Urology;  Laterality: Left;   CYSTOSCOPY WITH RETROGRADE PYELOGRAM, URETEROSCOPY AND STENT PLACEMENT Left 01/18/2017   Procedure: 1ST STAGE CYSTOSCOPY WITH RETROGRADE PYELOGRAM, URETEROSCOPY AND STENT REPLACEMENT;  Surgeon: Alexis Frock, MD;  Location: [facility];  Service: Urology;  Laterality: Left;   CYSTOSCOPY WITH RETROGRADE PYELOGRAM, URETEROSCOPY AND STENT PLACEMENT Left 02/01/2017   Procedure: 2ND STAGE CYSTOSCOPY WITH RETROGRADE PYELOGRAM, URETEROSCOPY AND STENT REPLACEMENT;  Surgeon: Alexis Frock, MD;  Location: [facility];  Service: Urology;  Laterality: Left;   ECTOPIC PREGNANCY SURGERY  1988   Left Salpingectomy   HOLMIUM LASER APPLICATION Left [id number]   Procedure: HOLMIUM LASER APPLICATION;  Surgeon: Alexis Frock, MD;  Location: [facility];  Service: Urology;  Laterality: Left;   HOLMIUM LASER APPLICATION Left [id number]   Procedure: HOLMIUM LASER APPLICATION;  Surgeon: Alexis Frock, MD;  Location: [facility];  Service: Urology;  Laterality: Left;   LUMBAR DISC SURGERY  01/1999   right L5 -- S1   RE-EXPLORATION LUMBAR/  LAMINECTOMY AND MICRODISKECTOMY  10/14/2001   right L5 -- S1   RIGHT HEART CATH N/A 05/23/2022   Procedure: RIGHT HEART CATH;  Surgeon: Haroldine Laws,  Shaune Pascal, MD;  Location: [place] [facility];  Service: Cardiovascular;  Laterality: N/A;   RIGHT/LEFT HEART CATH AND CORONARY ANGIOGRAPHY N/A 07/18/2021   Procedure: RIGHT/LEFT HEART CATH AND CORONARY ANGIOGRAPHY;  Surgeon: Jolaine Artist, MD;  Location: [place] [facility];  Service: Cardiovascular;  Laterality: N/A;   TUBAL LIGATION Bilateral [id number]   PPTL   UMBILICAL HERNIA REPAIR  age 28   Patient Active Problem List   Diagnosis Date Noted   LBBB (left bundle branch block) 02/01/2022   Chronic combined systolic and diastolic  heart failure [id number]   Acute combined systolic (congestive) and diastolic (congestive) heart failure    Hypocalcemia 07/12/2021   Elevated troponin 07/12/2021   Suspected COVID-19 virus infection 06/11/2019   Acute respiratory failure with hypoxia 06/11/2019   Multifocal pneumonia 06/11/2019   Ureteral stone with hydronephrosis 12/31/2016   Normocytic anemia 07/10/2016   Essential hypertension 07/26/2015   Eczema 07/26/2015   Overweight (BMI 25.0-29.9) 07/26/2015    REFERRING DIAG: right shoulder pain   THERAPY DIAG:  Chronic right shoulder pain  Muscle weakness  Localized edema  Rationale for Evaluation and Treatment Rehabilitation  PERTINENT HISTORY: Heart failure (does not tolerate reclined positions), history of heart attack   PRECAUTIONS: Heart failure (does not tolerate reclined positions); Adhesive capsulitis (?)  SUBJECTIVE:                                                                                                                                                                                      SUBJECTIVE STATEMENT:  Pt reports that she feels like the shot may be wearing off.  She rates her pain at 6/10 currently.   PAIN:  Are you having pain? 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                               SENSATION:          Light touch: Appears intact           PALPATION: Exquisite TTP R anterior shoulder and LH biceps tendon   UPPER EXTREMITY AROM:   ROM Right 07/11/2022 Left 07/11/2022 R 3/12  Shoulder flexion 65 135 82   Shoulder abduction 65  135 80  Shoulder internal rotation       Shoulder external rotation       Functional IR To greater troch T1   Functional ER To ear* T4*   Shoulder extension       Elbow extension       Elbow flexion         (Blank rows = not tested, N = WNL, * = concordant pain with testing)   UPPER EXTREMITY MMT:   MMT Right 07/11/2022 Left 07/11/2022  Shoulder flexion Unable d/t pain    Shoulder abduction (C5) Unable d/t pain    Shoulder ER 4 4  Shoulder IR 3* 4  Middle trapezius      Lower trapezius      Shoulder extension      Grip strength      Cervical flexion (C1,C2)      Cervical S/B (C3)      Shoulder shrug (C4)      Elbow flexion (C6) 3*    Elbow ext (C7)      Thumb ext (C8)      Finger abd (T1)      Grossly        (Blank rows = not tested, score listed is out of 5 possible points.  N = WNL, D = diminished, C = clear for gross weakness with myotome testing, * = concordant pain with testing)   UPPER EXTREMITY PROM:   PROM Right 07/11/2022 Left 07/11/2022 R 2/13 R 4/2  Shoulder flexion 90   95 100  Shoulder abduction 85   85   Shoulder internal rotation        Shoulder external rotation at 45 ~30 degrees   20 35  Functional IR        Functional ER        Shoulder extension        Elbow extension        Elbow flexion          (Blank rows = not tested, N = WNL, * = concordant pain with testing)   SHOULDER SPECIAL TESTS:           RC test cluster: + painful arc, - ER, - drop arm           Yergason's Test (+)     PATIENT SURVEYS:  Quick Dash 36/55              TODAY'S TREATMENT:  Creating, reviewing, and completing below HEP     PATIENT EDUCATION:  POC, diagnosis, prognosis, HEP, and outcome measures.  Pt educated via explanation, demonstration, and handout (HEP).  Pt confirms understanding verbally.    HOME EXERCISE PROGRAM: Access Code: [email or web address] URL: [email or web address] Date: 10/02/2022 Prepared by: Shearon Balo  Exercises - Shoulder External Rotation with Anchored Resistance  - 1 x daily - 7 x weekly - 3 sets - 10 reps - Standing Bicep Curls with Resistance  - 1 x daily - 7 x weekly - 3 sets - 10 reps - Supine Shoulder Flexion AAROM with Dowel  - 1 x daily - 7 x weekly - 3 sets - 10 reps - Supine Shoulder External Rotation in 45 Degrees Abduction AAROM with Dowel  - 1 x daily - 7 x weekly - 3 sets - 10 reps     TREATMENT 4/2 Therapeutic Exercise: - UBE - 2'/2' - L 2 - AAROM dowel flexion - AAROM ER dowel  - biceps curls - 3x15 - 2#  on dowel - Rows GTB 3x10 - shoulder ext - 3x10 - RTB - Standing ER/IR - YTB/RTB with manual posterior GH glide - 2x10 - ball roll on inclined table   Manual Therapy (performed in reclined position on table, no supine): - GH joint mobs - PROM all directions with inferior and posterior glide  Modalities: Vasopneumatic (Game Ready)   Location:  right shoulder Time:  10 minutes Pressure:  medium Temperature:  44 degrees   TREATMENT 09/11/22:  REEVAL (not other charges this visit)  Therapeutic Exercise: - UBE - 3'/3' - L 2  Manual Therapy (performed in reclined position on table, no supine): - GH joint mobs - PROM all directions with inferior and posterior glide  Modalities: Vasopneumatic (Game Ready)   Location:  right shoulder Time:  10 minutes Pressure:  medium Temperature:  44 degrees  TREATMENT 08/14/22: Therapeutic Exercise: - UBE - 3'/3' - L 2 - Elbow - flexion isometric - RTB - therapist resistance - 2x10 - ER and IR isometric - RTB - therapist resistance - 3x10  Manual Therapy (performed in reclined position on table, no supine): - GH joint mobs - PROM all directions  Modalities: Vasopneumatic (Game Ready)   Location:  right shoulder Time:  10 minutes Pressure:  medium Temperature:  44 degrees     ASSESSMENT:   CLINICAL IMPRESSION: Porschea tolerated session well with no adverse reaction.  ER is considerably  improved today at 35 degrees.  Biceps LH tendon is considerably sore on palpation.  Now likely dealing with more SAPS/biceps LH tendinopathy than AC with activities below shoulder level.  HEP updated.  May consider going to 1x/week.     OBJECTIVE IMPAIRMENTS: Pain, shoulder ROM, shoulder strength   ACTIVITY LIMITATIONS: lifting, reaching, housework   PERSONAL FACTORS: See medical history and pertinent history     REHAB POTENTIAL: Fair chronic   CLINICAL DECISION MAKING: Stable/uncomplicated   EVALUATION COMPLEXITY: Low     GOALS:     SHORT TERM GOALS: Target date: 08/08/2022   Devonie will be >75% HEP compliant to improve carryover between sessions and facilitate independent management of condition   Evaluation (07/11/2022): ongoing Goal status: MET 1/30     LONG TERM GOALS: Target date: 09/05/2022 (extended to 11/06/2022)   [place] will show a >/= 15 pt improvement in her QUICK DASH score (MCID is 13.4 pts or 8%) as a proxy for functional improvement    Evaluation/Baseline (07/11/2022): 35/55 pts 3/12: QuickDASH Score: 34.1 / 100 = 34.1 % Goal status: INITIAL     2.  Chaniyah will self report >/= 50% decrease in pain from evaluation    Evaluation/Baseline (07/11/2022): 10/10 max pain 1/30: 10/10 max, 4/10 min 2/13: 10/10 max, 4/10 min 3/12: 8/10 max, 3/10 min Goal status: ongoing     3.  Bernetha will demonstrate >120 degrees of active ROM in flexion to allow completion of activities involving reaching [place], not limited by pain   Evaluation/Baseline (07/11/2022): 65 degrees passive, unable active 1/30: 60 AROM w/ pain 2/13: 65 AROM w/ pain 3/12: 84 AROM w/ pain Goal status: ongoing     4.  Vanae will be able to complete light to moderate housework, not limited by pain   Evaluation/Baseline (07/11/2022): limited 1/30: able to do light housework (wash dishes), but no moderate housework 3/12: still limited in moderate to heavy housework Goal status: ongoing      PLAN: PT FREQUENCY: 1-2x/week   PT DURATION: 8 weeks (Ending 11/06/2022 )   PLANNED INTERVENTIONS:  Therapeutic exercises, Aquatic therapy, Therapeutic activity, Neuro Muscular re-education, Gait training, Patient/Family education, Joint mobilization, Dry Needling, Electrical stimulation, Spinal mobilization and/or manipulation, Moist heat, Taping, Vasopneumatic device, Ionotophoresis 4mg /ml Dexamethasone, and Manual therapy   PLAN FOR NEXT SESSION: MT for ROM, GH joint mobs, progressive biceps loading   Mathis Dad, PT 10/02/2022, 11:32 AM

## 2022-10-04 ENCOUNTER — Ambulatory Visit: Payer: Medicaid Other | Admitting: Physical Therapy

## 2022-10-04 ENCOUNTER — Encounter: Payer: Self-pay | Admitting: Physical Therapy

## 2022-10-04 DIAGNOSIS — M6281 Muscle weakness (generalized): Secondary | ICD-10-CM

## 2022-10-04 DIAGNOSIS — M25511 Pain in right shoulder: Secondary | ICD-10-CM | POA: Diagnosis not present

## 2022-10-04 DIAGNOSIS — G8929 Other chronic pain: Secondary | ICD-10-CM

## 2022-10-04 NOTE — Therapy (Signed)
OUTPATIENT PHYSICAL THERAPY TREATMENT NOTE   Patient Name: Lynn Estrada MRN: [id number] DOB:22-Jul-1960, 62 y.o., female Today's Date: 10/04/2022  PCP: [name], PA  REFERRING PROVIDER: Wendall Mola, PA-C   END OF SESSION:   PT End of Session - 10/04/22 1046     Visit Number 13    Date for PT Re-Evaluation 11/06/22    Authorization Type [employer] [place] - Jeanie Cooks    Authorization Time Period Approved 10 visits 07/11/22-09/09/22    8 visits approved 09/11/22-11/23/22    Authorization - Visit Number 4    Authorization - Number of Visits 8    PT Start Time [id number]    PT Stop Time [id number]    PT Time Calculation (min) 43 min    Activity Tolerance Patient tolerated treatment well;Patient limited by pain    Behavior During Therapy [facility] for tasks assessed/performed              Past Medical History:  Diagnosis Date   Anemia    Eczema    GERD (gastroesophageal reflux disease)    History of adenomatous polyp of colon    08-03-2016  tubular adenoma x2 and hyperplastic   History of chronic gastritis 08/03/2016   History of ectopic pregnancy 1988   s/p  left salpingectomy   History of endometriosis    History of kidney stones    History of partial nephrectomy    right pelvis for very large stone   History of pneumonia 07/02/2016   CAP   History of sepsis    07-01-2016  sepsis w/ pyelonephritis, CAP /   12-31-2016  urosepsis w/ kidney stone obstruction   History of uterine leiomyoma    Hypertension    Left ureteral stone    Nephrolithiasis    left obstructive stone and right non-obstructive stone  per CT 12-31-2016   Urgency of urination    Past Surgical History:  Procedure Laterality Date   ABDOMINAL HYSTERECTOMY  01/20/2007   w/ Lysis Adhesions/  Left salpingoophorectomy/  Right Salpingectomy   CHOLECYSTECTOMY  1994   and Right Partial Nephrectomy (pelvis for very large stone)   CYSTOSCOPY W/ URETERAL STENT PLACEMENT Left 12/31/2016   Procedure: CYSTOSCOPY WITH  RETROGRADE PYELOGRAM/ LEFT URETERAL STENT PLACEMENT;  Surgeon: Alexis Frock, MD;  Location: [facility];  Service: Urology;  Laterality: Left;   CYSTOSCOPY WITH RETROGRADE PYELOGRAM, URETEROSCOPY AND STENT PLACEMENT Left 01/18/2017   Procedure: 1ST STAGE CYSTOSCOPY WITH RETROGRADE PYELOGRAM, URETEROSCOPY AND STENT REPLACEMENT;  Surgeon: Alexis Frock, MD;  Location: [facility];  Service: Urology;  Laterality: Left;   CYSTOSCOPY WITH RETROGRADE PYELOGRAM, URETEROSCOPY AND STENT PLACEMENT Left 02/01/2017   Procedure: 2ND STAGE CYSTOSCOPY WITH RETROGRADE PYELOGRAM, URETEROSCOPY AND STENT REPLACEMENT;  Surgeon: Alexis Frock, MD;  Location: [facility];  Service: Urology;  Laterality: Left;   ECTOPIC PREGNANCY SURGERY  1988   Left Salpingectomy   HOLMIUM LASER APPLICATION Left [id number]   Procedure: HOLMIUM LASER APPLICATION;  Surgeon: Alexis Frock, MD;  Location: [facility];  Service: Urology;  Laterality: Left;   HOLMIUM LASER APPLICATION Left [id number]   Procedure: HOLMIUM LASER APPLICATION;  Surgeon: Alexis Frock, MD;  Location: [facility];  Service: Urology;  Laterality: Left;   LUMBAR DISC SURGERY  01/1999   right L5 -- S1   RE-EXPLORATION LUMBAR/  LAMINECTOMY AND MICRODISKECTOMY  10/14/2001   right L5 -- S1   RIGHT HEART CATH N/A 05/23/2022   Procedure: RIGHT HEART CATH;  Surgeon: Haroldine Laws,  Shaune Pascal, MD;  Location: [facility];  Service: Cardiovascular;  Laterality: N/A;   RIGHT/LEFT HEART CATH AND CORONARY ANGIOGRAPHY N/A 07/18/2021   Procedure: RIGHT/LEFT HEART CATH AND CORONARY ANGIOGRAPHY;  Surgeon: Jolaine Artist, MD;  Location: [facility];  Service: Cardiovascular;  Laterality: N/A;   TUBAL LIGATION Bilateral [id number]   PPTL   UMBILICAL HERNIA REPAIR  age 34   Patient Active Problem List   Diagnosis Date Noted   LBBB (left bundle branch block) 02/01/2022   Chronic combined systolic and diastolic  heart failure [id number]   Acute combined systolic (congestive) and diastolic (congestive) heart failure    Hypocalcemia 07/12/2021   Elevated troponin 07/12/2021   Suspected COVID-19 virus infection 06/11/2019   Acute respiratory failure with hypoxia 06/11/2019   Multifocal pneumonia 06/11/2019   Ureteral stone with hydronephrosis 12/31/2016   Normocytic anemia 07/10/2016   Essential hypertension 07/26/2015   Eczema 07/26/2015   Overweight (BMI 25.0-29.9) 07/26/2015    REFERRING DIAG: right shoulder pain   THERAPY DIAG:  Chronic right shoulder pain  Muscle weakness  Rationale for Evaluation and Treatment Rehabilitation  PERTINENT HISTORY: Heart failure (does not tolerate reclined positions), history of heart attack   PRECAUTIONS: Heart failure (does not tolerate reclined positions); Adhesive capsulitis (?)  SUBJECTIVE:                                                                                                                                                                                      SUBJECTIVE STATEMENT:  Pt reports that her shoulder is doing so-so today.  She continues to have difficulty reaching [place].  She rates her pain at 5/10 currently.   PAIN:  Are you having pain? 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                               SENSATION:          Light touch: Appears intact           PALPATION: Exquisite TTP R anterior shoulder and LH biceps tendon   UPPER EXTREMITY AROM:   ROM Right 07/11/2022 Left 07/11/2022 R 3/12 R 4/4   Shoulder flexion 65 135 82  110 AAROM  Shoulder abduction 65 135 80   Shoulder internal rotation        Shoulder external rotation        Functional IR To greater troch T1    Functional ER To ear* T4*    Shoulder extension        Elbow extension        Elbow flexion          (Blank rows = not tested, N = WNL, * = concordant pain with testing)   UPPER EXTREMITY MMT:   MMT Right 07/11/2022 Left 07/11/2022  Shoulder flexion Unable d/t pain    Shoulder abduction (C5) Unable d/t pain    Shoulder ER 4 4  Shoulder IR 3* 4  Middle trapezius      Lower trapezius      Shoulder extension      Grip strength      Cervical flexion (C1,C2)      Cervical S/B (C3)      Shoulder shrug (C4)      Elbow flexion (C6) 3*    Elbow ext (C7)      Thumb ext (C8)      Finger abd (T1)      Grossly        (Blank rows = not tested, score listed is out of 5 possible points.  N = WNL, D = diminished, C = clear for gross weakness with myotome testing, * = concordant pain with testing)   UPPER EXTREMITY PROM:   PROM Right 07/11/2022 Left 07/11/2022 R 2/13 R 4/2  Shoulder flexion 90   95 100  Shoulder abduction 85   85   Shoulder internal rotation        Shoulder external rotation at 45 ~30 degrees   20 35  Functional IR        Functional ER        Shoulder extension        Elbow extension        Elbow flexion          (Blank rows = not tested, N = WNL, * = concordant pain with testing)   SHOULDER SPECIAL TESTS:           RC test cluster: + painful arc, - ER, - drop arm           Yergason's Test (+)     PATIENT SURVEYS:  Quick Dash 36/55              TODAY'S TREATMENT:  Creating, reviewing, and completing below HEP     PATIENT EDUCATION:  POC, diagnosis, prognosis, HEP, and outcome measures.  Pt educated via explanation, demonstration, and handout (HEP).  Pt confirms understanding verbally.    HOME EXERCISE PROGRAM: Access Code: [email or web address] URL:  [email or web address] Date: 10/04/2022 Prepared by: Shearon Balo  Exercises - Shoulder External Rotation with Anchored Resistance  - 1 x daily - 7 x weekly - 3 sets - 10 reps - Standing Bicep Curls with Resistance  - 1 x daily - 7 x weekly - 3 sets - 10 reps - Supine Shoulder Flexion AAROM with Dowel  - 1 x daily - 7 x weekly - 3 sets - 10 reps - Supine Shoulder External Rotation in 45 Degrees Abduction AAROM with Dowel  - 1 x daily - 7 x weekly - 3 sets - 10 reps - Standing Shoulder Row with Anchored Resistance  - 1 x daily - 7 x weekly -  3 sets - 10 reps - Scaption Wall Slide with Towel  - 1 x daily - 7 x weekly - 3 sets - 10 reps     TREATMENT 4/2 Therapeutic Exercise: - UBE - 2'/2' - L 2 - AAROM dowel flexion - @ 45 degree incline - biceps curls - 3x10 - 3# - Rows Black TB 3x10 - shoulder ext - 3x10 - GTB - Standing ER/IR - YTB/GTB with manual posterior GH glide - 15x - towel slide   Manual Therapy (performed in reclined position on table, no supine): - GH joint mobs - PROM all directions with inferior and posterior glide  Modalities: Vasopneumatic (Game Ready)   Location:  right shoulder Time:  10 minutes Pressure:  medium Temperature:  44 degrees   TREATMENT 09/11/22:  REEVAL (not other charges this visit)  Therapeutic Exercise: - UBE - 3'/3' - L 2  Manual Therapy (performed in reclined position on table, no supine): - GH joint mobs - PROM all directions with inferior and posterior glide  Modalities: Vasopneumatic (Game Ready)   Location:  right shoulder Time:  10 minutes Pressure:  medium Temperature:  44 degrees  TREATMENT 08/14/22: Therapeutic Exercise: - UBE - 3'/3' - L 2 - Elbow - flexion isometric - RTB - therapist resistance - 2x10 - ER and IR isometric - RTB - therapist resistance - 3x10  Manual Therapy (performed in reclined position on table, no supine): - GH joint mobs - PROM all directions  Modalities: Vasopneumatic  (Game Ready)   Location:  right shoulder Time:  10 minutes Pressure:  medium Temperature:  44 degrees     ASSESSMENT:   CLINICAL IMPRESSION: Shwanda tolerated session well with no adverse reaction.  Pt with significant improvement in AAROM flexion today.  Also progressing strengthening exercises as expected.   OBJECTIVE IMPAIRMENTS: Pain, shoulder ROM, shoulder strength   ACTIVITY LIMITATIONS: lifting, reaching, housework   PERSONAL FACTORS: See medical history and pertinent history     REHAB POTENTIAL: Fair chronic   CLINICAL DECISION MAKING: Stable/uncomplicated   EVALUATION COMPLEXITY: Low     GOALS:     SHORT TERM GOALS: Target date: 08/08/2022   Betzabeth will be >75% HEP compliant to improve carryover between sessions and facilitate independent management of condition   Evaluation (07/11/2022): ongoing Goal status: MET 1/30     LONG TERM GOALS: Target date: 09/05/2022 (extended to 11/06/2022)   [place] will show a >/= 15 pt improvement in her QUICK DASH score (MCID is 13.4 pts or 8%) as a proxy for functional improvement    Evaluation/Baseline (07/11/2022): 35/55 pts 3/12: QuickDASH Score: 34.1 / 100 = 34.1 % Goal status: INITIAL     2.  Alaythia will self report >/= 50% decrease in pain from evaluation    Evaluation/Baseline (07/11/2022): 10/10 max pain 1/30: 10/10 max, 4/10 min 2/13: 10/10 max, 4/10 min 3/12: 8/10 max, 3/10 min Goal status: ongoing     3.  Perri will demonstrate >120 degrees of active ROM in flexion to allow completion of activities involving reaching [place], not limited by pain   Evaluation/Baseline (07/11/2022): 65 degrees passive, unable active 1/30: 60 AROM w/ pain 2/13: 65 AROM w/ pain 3/12: 84 AROM w/ pain Goal status: ongoing     4.  Chaniqua will be able to complete light to moderate housework, not limited by pain   Evaluation/Baseline (07/11/2022): limited 1/30: able to do light housework (wash dishes), but no moderate  housework 3/12: still limited in moderate to heavy housework  Goal status: ongoing     PLAN: PT FREQUENCY: 1-2x/week   PT DURATION: 8 weeks (Ending 11/06/2022 )   PLANNED INTERVENTIONS: Therapeutic exercises, Aquatic therapy, Therapeutic activity, Neuro Muscular re-education, Gait training, Patient/Family education, Joint mobilization, Dry Needling, Electrical stimulation, Spinal mobilization and/or manipulation, Moist heat, Taping, Vasopneumatic device, Ionotophoresis 4mg /ml Dexamethasone, and Manual therapy   PLAN FOR NEXT SESSION: MT for ROM, GH joint mobs, progressive biceps loading   Mathis Dad, PT 10/04/2022, 11:28 AM

## 2022-10-16 ENCOUNTER — Ambulatory Visit: Payer: Medicaid Other | Admitting: Physical Therapy

## 2022-10-16 ENCOUNTER — Encounter: Payer: Self-pay | Admitting: Physical Therapy

## 2022-10-16 DIAGNOSIS — R600 Localized edema: Secondary | ICD-10-CM

## 2022-10-16 DIAGNOSIS — G8929 Other chronic pain: Secondary | ICD-10-CM

## 2022-10-16 DIAGNOSIS — M6281 Muscle weakness (generalized): Secondary | ICD-10-CM

## 2022-10-16 DIAGNOSIS — M25511 Pain in right shoulder: Secondary | ICD-10-CM | POA: Diagnosis not present

## 2022-10-16 NOTE — Therapy (Signed)
OUTPATIENT PHYSICAL THERAPY TREATMENT NOTE   Patient Name: Lynn Estrada MRN: [id number] DOB:[birth year], 62 y.o., female Today's Date: 10/16/2022  PCP: Porfirio Oar, PA  REFERRING PROVIDER: Starr Sinclair, PA-C   END OF SESSION:   PT End of Session - 10/16/22 1046     Visit Number 14    Date for PT Re-Evaluation 11/06/22    Authorization Type Rolene Arbour - Berneda Rose    Authorization Time Period Approved 10 visits 07/11/22-09/09/22    8 visits approved 09/11/22-11/23/22    Authorization - Visit Number 5    Authorization - Number of Visits 8    PT Start Time 1045    PT Stop Time 1126    PT Time Calculation (min) 41 min    Activity Tolerance Patient tolerated treatment well;Patient limited by pain    Behavior During Therapy [facility] for tasks assessed/performed              Past Medical History:  Diagnosis Date   Anemia    Eczema    GERD (gastroesophageal reflux disease)    History of adenomatous polyp of colon    08-03-2016  tubular adenoma x2 and hyperplastic   History of chronic gastritis 08/03/2016   History of ectopic pregnancy 1988   s/p  left salpingectomy   History of endometriosis    History of kidney stones    History of partial nephrectomy    right pelvis for very large stone   History of pneumonia 07/02/2016   CAP   History of sepsis    07-01-2016  sepsis w/ pyelonephritis, CAP /   12-31-2016  urosepsis w/ kidney stone obstruction   History of uterine leiomyoma    Hypertension    Left ureteral stone    Nephrolithiasis    left obstructive stone and right non-obstructive stone  per CT 12-31-2016   Urgency of urination    Past Surgical History:  Procedure Laterality Date   ABDOMINAL HYSTERECTOMY  01/20/2007   w/ Lysis Adhesions/  Left salpingoophorectomy/  Right Salpingectomy   CHOLECYSTECTOMY  1994   and Right Partial Nephrectomy (pelvis for very large stone)   CYSTOSCOPY W/ URETERAL STENT PLACEMENT Left 12/31/2016   Procedure: CYSTOSCOPY WITH  RETROGRADE PYELOGRAM/ LEFT URETERAL STENT PLACEMENT;  Surgeon: Sebastian Ache, MD;  Location: [facility];  Service: Urology;  Laterality: Left;   CYSTOSCOPY WITH RETROGRADE PYELOGRAM, URETEROSCOPY AND STENT PLACEMENT Left 01/18/2017   Procedure: 1ST STAGE CYSTOSCOPY WITH RETROGRADE PYELOGRAM, URETEROSCOPY AND STENT REPLACEMENT;  Surgeon: Sebastian Ache, MD;  Location: [facility];  Service: Urology;  Laterality: Left;   CYSTOSCOPY WITH RETROGRADE PYELOGRAM, URETEROSCOPY AND STENT PLACEMENT Left 02/01/2017   Procedure: 2ND STAGE CYSTOSCOPY WITH RETROGRADE PYELOGRAM, URETEROSCOPY AND STENT REPLACEMENT;  Surgeon: Sebastian Ache, MD;  Location: [facility];  Service: Urology;  Laterality: Left;   ECTOPIC PREGNANCY SURGERY  1988   Left Salpingectomy   HOLMIUM LASER APPLICATION Left 01/18/2017   Procedure: HOLMIUM LASER APPLICATION;  Surgeon: Sebastian Ache, MD;  Location: [facility];  Service: Urology;  Laterality: Left;   HOLMIUM LASER APPLICATION Left 02/01/2017   Procedure: HOLMIUM LASER APPLICATION;  Surgeon: Sebastian Ache, MD;  Location: [facility];  Service: Urology;  Laterality: Left;   LUMBAR DISC SURGERY  01/1999   right L5 -- S1   RE-EXPLORATION LUMBAR/  LAMINECTOMY AND MICRODISKECTOMY  10/14/2001   right L5 -- S1   RIGHT HEART CATH N/A 05/23/2022   Procedure: RIGHT HEART CATH;  Surgeon: Gala Romney,  Bevelyn Buckles, MD;  Location: [facility] INVASIVE CV LAB;  Service: Cardiovascular;  Laterality: N/A;   RIGHT/LEFT HEART CATH AND CORONARY ANGIOGRAPHY N/A 07/18/2021   Procedure: RIGHT/LEFT HEART CATH AND CORONARY ANGIOGRAPHY;  Surgeon: Dolores Patty, MD;  Location: [facility] INVASIVE CV LAB;  Service: Cardiovascular;  Laterality: N/A;   TUBAL LIGATION Bilateral 10/17/1999   PPTL   UMBILICAL HERNIA REPAIR  age 25   Patient Active Problem List   Diagnosis Date Noted   LBBB (left bundle branch block) 02/01/2022   Chronic combined systolic and diastolic  heart failure 07/27/2021   Acute combined systolic (congestive) and diastolic (congestive) heart failure    Hypocalcemia 07/12/2021   Elevated troponin 07/12/2021   Suspected COVID-19 virus infection 06/11/2019   Acute respiratory failure with hypoxia 06/11/2019   Multifocal pneumonia 06/11/2019   Ureteral stone with hydronephrosis 12/31/2016   Normocytic anemia 07/10/2016   Essential hypertension 07/26/2015   Eczema 07/26/2015   Overweight (BMI 25.0-29.9) 07/26/2015    REFERRING DIAG: right shoulder pain   THERAPY DIAG:  Chronic right shoulder pain  Muscle weakness  Localized edema  Rationale for Evaluation and Treatment Rehabilitation  PERTINENT HISTORY: Heart failure (does not tolerate reclined positions), history of heart attack   PRECAUTIONS: Heart failure (does not tolerate reclined positions); Adhesive capsulitis (?)  SUBJECTIVE:                                                                                                                                                                                      SUBJECTIVE STATEMENT:  Pt reports that her shoulder has been more painful.  She feels she has more ROM, but increased pain.  She rates her pain 7/10 currently.   PAIN:  Are you having pain? 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                               SENSATION:          Light touch: Appears intact           PALPATION: Exquisite TTP R anterior shoulder and LH biceps tendon   UPPER EXTREMITY AROM:   ROM Right 07/11/2022 Left 07/11/2022  R 3/12 R 4/4 R 4/16  Shoulder flexion 65 135 82 110 AAROM 92  AROM  Shoulder abduction 65 135 80    Shoulder internal rotation         Shoulder external rotation         Functional IR To greater troch T1     Functional ER To ear* T4*     Shoulder extension         Elbow extension         Elbow flexion           (Blank rows = not tested, N = WNL, * = concordant pain with testing)   UPPER EXTREMITY MMT:   MMT Right 07/11/2022 Left 07/11/2022  Shoulder flexion Unable d/t pain    Shoulder abduction (C5) Unable d/t pain    Shoulder ER 4 4  Shoulder IR 3* 4  Middle trapezius      Lower trapezius      Shoulder extension      Grip strength      Cervical flexion (C1,C2)      Cervical S/B (C3)      Shoulder shrug (C4)      Elbow flexion (C6) 3*    Elbow ext (C7)      Thumb ext (C8)      Finger abd (T1)      Grossly        (Blank rows = not tested, score listed is out of 5 possible points.  N = WNL, D = diminished, C = clear for gross weakness with myotome testing, * = concordant pain with testing)   UPPER EXTREMITY PROM:   PROM Right 07/11/2022 Left 07/11/2022 R 2/13 R 4/2  Shoulder flexion 90   95 100  Shoulder abduction 85   85   Shoulder internal rotation        Shoulder external rotation at 45 ~30 degrees   20 35  Functional IR        Functional ER        Shoulder extension        Elbow extension        Elbow flexion          (Blank rows = not tested, N = WNL, * = concordant pain with testing)   SHOULDER SPECIAL TESTS:           RC test cluster: + painful arc, - ER, - drop arm           Yergason's Test (+)     PATIENT SURVEYS:  Quick Dash 36/55              TODAY'S TREATMENT:  Creating, reviewing, and completing below HEP     PATIENT EDUCATION:  POC, diagnosis, prognosis, HEP, and outcome measures.  Pt educated via explanation, demonstration, and handout (HEP).  Pt confirms understanding verbally.    HOME EXERCISE PROGRAM: Access Code: [email or web address] URL:  [email or web address] Date: 10/04/2022 Prepared by: Alphonzo Severance  Exercises - Shoulder External Rotation with Anchored Resistance  - 1 x daily - 7 x weekly - 3 sets - 10 reps - Standing Bicep Curls with Resistance  - 1 x daily - 7 x weekly - 3 sets - 10 reps - Supine Shoulder Flexion AAROM with Dowel  - 1 x daily - 7 x weekly - 3 sets - 10 reps - Supine Shoulder External Rotation in 45 Degrees Abduction AAROM with Dowel  - 1 x daily - 7 x weekly - 3 sets - 10 reps -  Standing Shoulder Row with Anchored Resistance  - 1 x daily - 7 x weekly - 3 sets - 10 reps - Scaption Wall Slide with Towel  - 1 x daily - 7 x weekly - 3 sets - 10 reps     TREATMENT 4/16 Therapeutic Exercise: - UBE - 2'/2' - L 2 - AAROM dowel flexion - @ 45 degree incline - biceps curls - 3x10 - 4# - Rows Black TB 3x10 - shoulder ext - 3x10 - GTB - Standing ER/IR - YTB/GTB with manual posterior GH glide - 15x - Pball roll up wall - 2x10   Manual Therapy (performed in reclined position on table, no supine): - GH joint mobs - PROM all directions with inferior and posterior glide  Modalities: Vasopneumatic (Game Ready)   Location:  right shoulder Time:  10 minutes Pressure:  medium Temperature:  44 degrees   TREATMENT 09/11/22:  REEVAL (not other charges this visit)  Therapeutic Exercise: - UBE - 3'/3' - L 2  Manual Therapy (performed in reclined position on table, no supine): - GH joint mobs - PROM all directions with inferior and posterior glide  Modalities: Vasopneumatic (Game Ready)   Location:  right shoulder Time:  10 minutes Pressure:  medium Temperature:  44 degrees  TREATMENT 08/14/22: Therapeutic Exercise: - UBE - 3'/3' - L 2 - Elbow - flexion isometric - RTB - therapist resistance - 2x10 - ER and IR isometric - RTB - therapist resistance - 3x10  Manual Therapy (performed in reclined position on table, no supine): - GH joint mobs - PROM all  directions  Modalities: Vasopneumatic (Game Ready)   Location:  right shoulder Time:  10 minutes Pressure:  medium Temperature:  44 degrees     ASSESSMENT:   CLINICAL IMPRESSION: Masiya tolerated session well with no adverse reaction.  Pt continues to show improvement in ROM but with significant pain.  She is improving functionally and was able to complete some landscaping this weekend.   OBJECTIVE IMPAIRMENTS: Pain, shoulder ROM, shoulder strength   ACTIVITY LIMITATIONS: lifting, reaching, housework   PERSONAL FACTORS: See medical history and pertinent history     REHAB POTENTIAL: Fair chronic   CLINICAL DECISION MAKING: Stable/uncomplicated   EVALUATION COMPLEXITY: Low     GOALS:     SHORT TERM GOALS: Target date: 08/08/2022   Mayumi will be >75% HEP compliant to improve carryover between sessions and facilitate independent management of condition   Evaluation (07/11/2022): ongoing Goal status: MET 1/30     LONG TERM GOALS: Target date: 09/05/2022 (extended to 11/06/2022)   [place] will show a >/= 15 pt improvement in her QUICK DASH score (MCID is 13.4 pts or 8%) as a proxy for functional improvement    Evaluation/Baseline (07/11/2022): 35/55 pts 3/12: QuickDASH Score: 34.1 / 100 = 34.1 % Goal status: INITIAL     2.  Markeesha will self report >/= 50% decrease in pain from evaluation    Evaluation/Baseline (07/11/2022): 10/10 max pain 1/30: 10/10 max, 4/10 min 2/13: 10/10 max, 4/10 min 3/12: 8/10 max, 3/10 min Goal status: ongoing     3.  Aleila will demonstrate >120 degrees of active ROM in flexion to allow completion of activities involving reaching OH, not limited by pain   Evaluation/Baseline (07/11/2022): 65 degrees passive, unable active 1/30: 60 AROM w/ pain 2/13: 65 AROM w/ pain 3/12: 84 AROM w/ pain 4/16: 92 AROM w/ pain Goal status: ongoing     4.  Laurina will be able to  complete light to moderate housework, not limited by pain    Evaluation/Baseline (07/11/2022): limited 1/30: able to do light housework (wash dishes), but no moderate housework 3/12: still limited in moderate to heavy housework Goal status: ongoing     PLAN: PT FREQUENCY: 1-2x/week   PT DURATION: 8 weeks (Ending 11/06/2022 )   PLANNED INTERVENTIONS: Therapeutic exercises, Aquatic therapy, Therapeutic activity, Neuro Muscular re-education, Gait training, Patient/Family education, Joint mobilization, Dry Needling, Electrical stimulation, Spinal mobilization and/or manipulation, Moist heat, Taping, Vasopneumatic device, Ionotophoresis 4mg /ml Dexamethasone, and Manual therapy   PLAN FOR NEXT SESSION: MT for ROM, GH joint mobs, progressive biceps loading   Fredderick Phenix, PT 10/16/2022, 11:30 AM

## 2022-10-18 ENCOUNTER — Ambulatory Visit: Payer: Medicaid Other | Admitting: Physical Therapy

## 2022-10-25 ENCOUNTER — Ambulatory Visit: Payer: Medicaid Other

## 2022-10-25 DIAGNOSIS — M6281 Muscle weakness (generalized): Secondary | ICD-10-CM

## 2022-10-25 DIAGNOSIS — M25511 Pain in right shoulder: Secondary | ICD-10-CM | POA: Diagnosis not present

## 2022-10-25 DIAGNOSIS — R600 Localized edema: Secondary | ICD-10-CM

## 2022-10-25 DIAGNOSIS — G8929 Other chronic pain: Secondary | ICD-10-CM

## 2022-10-25 NOTE — Therapy (Signed)
OUTPATIENT PHYSICAL THERAPY TREATMENT NOTE   Patient Name: Lynn Estrada MRN: [id number] DOB:1960-07-21, 62 y.o., female Today's Date: 10/25/2022  PCP: Porfirio Oar, PA  REFERRING PROVIDER: Starr Sinclair, PA-C   END OF SESSION:   PT End of Session - 10/25/22 0957     Visit Number 15    Date for PT Re-Evaluation 11/06/22    Authorization Type Rolene Arbour - Berneda Rose    Authorization Time Period Approved 10 visits 07/11/22-09/09/22    8 visits approved 09/11/22-11/23/22    Authorization - Visit Number 6    Authorization - Number of Visits 8    PT Start Time 1000    PT Stop Time 1048    PT Time Calculation (min) 48 min    Activity Tolerance Patient tolerated treatment well;Patient limited by pain    Behavior During Therapy [facility] for tasks assessed/performed              Past Medical History:  Diagnosis Date   Anemia    Eczema    GERD (gastroesophageal reflux disease)    History of adenomatous polyp of colon    08-03-2016  tubular adenoma x2 and hyperplastic   History of chronic gastritis 08/03/2016   History of ectopic pregnancy 1988   s/p  left salpingectomy   History of endometriosis    History of kidney stones    History of partial nephrectomy    right pelvis for very large stone   History of pneumonia 07/02/2016   CAP   History of sepsis    07-01-2016  sepsis w/ pyelonephritis, CAP /   12-31-2016  urosepsis w/ kidney stone obstruction   History of uterine leiomyoma    Hypertension    Left ureteral stone    Nephrolithiasis    left obstructive stone and right non-obstructive stone  per CT 12-31-2016   Urgency of urination    Past Surgical History:  Procedure Laterality Date   ABDOMINAL HYSTERECTOMY  01/20/2007   w/ Lysis Adhesions/  Left salpingoophorectomy/  Right Salpingectomy   CHOLECYSTECTOMY  1994   and Right Partial Nephrectomy (pelvis for very large stone)   CYSTOSCOPY W/ URETERAL STENT PLACEMENT Left 12/31/2016   Procedure: CYSTOSCOPY WITH  RETROGRADE PYELOGRAM/ LEFT URETERAL STENT PLACEMENT;  Surgeon: Sebastian Ache, MD;  Location: [facility];  Service: Urology;  Laterality: Left;   CYSTOSCOPY WITH RETROGRADE PYELOGRAM, URETEROSCOPY AND STENT PLACEMENT Left 01/18/2017   Procedure: 1ST STAGE CYSTOSCOPY WITH RETROGRADE PYELOGRAM, URETEROSCOPY AND STENT REPLACEMENT;  Surgeon: Sebastian Ache, MD;  Location: [facility];  Service: Urology;  Laterality: Left;   CYSTOSCOPY WITH RETROGRADE PYELOGRAM, URETEROSCOPY AND STENT PLACEMENT Left 02/01/2017   Procedure: 2ND STAGE CYSTOSCOPY WITH RETROGRADE PYELOGRAM, URETEROSCOPY AND STENT REPLACEMENT;  Surgeon: Sebastian Ache, MD;  Location: [facility];  Service: Urology;  Laterality: Left;   ECTOPIC PREGNANCY SURGERY  1988   Left Salpingectomy   HOLMIUM LASER APPLICATION Left 01/18/2017   Procedure: HOLMIUM LASER APPLICATION;  Surgeon: Sebastian Ache, MD;  Location: [facility];  Service: Urology;  Laterality: Left;   HOLMIUM LASER APPLICATION Left 02/01/2017   Procedure: HOLMIUM LASER APPLICATION;  Surgeon: Sebastian Ache, MD;  Location: [facility];  Service: Urology;  Laterality: Left;   LUMBAR DISC SURGERY  01/1999   right L5 -- S1   RE-EXPLORATION LUMBAR/  LAMINECTOMY AND MICRODISKECTOMY  10/14/2001   right L5 -- S1   RIGHT HEART CATH N/A 05/23/2022   Procedure: RIGHT HEART CATH;  Surgeon: Gala Romney,  Bevelyn Buckles, MD;  Location: [facility] INVASIVE CV [facility];  Service: Cardiovascular;  Laterality: N/A;   RIGHT/LEFT HEART CATH AND CORONARY ANGIOGRAPHY N/A 07/18/2021   Procedure: RIGHT/LEFT HEART CATH AND CORONARY ANGIOGRAPHY;  Surgeon: Dolores Patty, MD;  Location: [facility] INVASIVE CV LAB;  Service: Cardiovascular;  Laterality: N/A;   TUBAL LIGATION Bilateral 10/17/1999   PPTL   UMBILICAL HERNIA REPAIR  age 10   Patient Active Problem List   Diagnosis Date Noted   LBBB (left bundle branch block) 02/01/2022   Chronic combined systolic and diastolic  heart failure 07/27/2021   Acute combined systolic (congestive) and diastolic (congestive) heart failure    Hypocalcemia 07/12/2021   Elevated troponin 07/12/2021   Suspected COVID-19 virus infection 06/11/2019   Acute respiratory failure with hypoxia 06/11/2019   Multifocal pneumonia 06/11/2019   Ureteral stone with hydronephrosis 12/31/2016   Normocytic anemia 07/10/2016   Essential hypertension 07/26/2015   Eczema 07/26/2015   Overweight (BMI 25.0-29.9) 07/26/2015    REFERRING DIAG: right shoulder pain   THERAPY DIAG:  Chronic right shoulder pain  Muscle weakness  Localized edema  Rationale for Evaluation and Treatment Rehabilitation  PERTINENT HISTORY: Heart failure (does not tolerate reclined positions), history of heart attack   PRECAUTIONS: Heart failure (does not tolerate reclined positions); Adhesive capsulitis (?)  SUBJECTIVE:                                                                                                                                                                                      SUBJECTIVE STATEMENT:  Patient reports that her pain continues to be very high and has been taking OTC to help.   PAIN:  Are you having pain? 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                               SENSATION:          Light touch: Appears intact           PALPATION: Exquisite TTP R anterior shoulder and LH biceps tendon   UPPER EXTREMITY AROM:   ROM Right 07/11/2022 Left 07/11/2022 R 3/12 R 4/4 R 4/16  Shoulder flexion 65 135 82  110 AAROM 92  AROM  Shoulder abduction 65 135 80    Shoulder internal rotation         Shoulder external rotation         Functional IR To greater troch T1     Functional ER To ear* T4*     Shoulder extension         Elbow extension         Elbow flexion           (Blank rows = not tested, N = WNL, * = concordant pain with testing)   UPPER EXTREMITY MMT:   MMT Right 07/11/2022 Left 07/11/2022  Shoulder flexion Unable d/t pain    Shoulder abduction (C5) Unable d/t pain    Shoulder ER 4 4  Shoulder IR 3* 4  Middle trapezius      Lower trapezius      Shoulder extension      Grip strength      Cervical flexion (C1,C2)      Cervical S/B (C3)      Shoulder shrug (C4)      Elbow flexion (C6) 3*    Elbow ext (C7)      Thumb ext (C8)      Finger abd (T1)      Grossly        (Blank rows = not tested, score listed is out of 5 possible points.  N = WNL, D = diminished, C = clear for gross weakness with myotome testing, * = concordant pain with testing)   UPPER EXTREMITY PROM:   PROM Right 07/11/2022 Left 07/11/2022 R 2/13 R 4/2  Shoulder flexion 90   95 100  Shoulder abduction 85   85   Shoulder internal rotation        Shoulder external rotation at 45 ~30 degrees   20 35  Functional IR        Functional ER        Shoulder extension        Elbow extension        Elbow flexion          (Blank rows = not tested, N = WNL, * = concordant pain with testing)   SHOULDER SPECIAL TESTS:           RC test cluster: + painful arc, - ER, - drop arm           Yergason's Test (+)     PATIENT SURVEYS:  Quick Dash 36/55              TODAY'S TREATMENT:  Creating, reviewing, and completing below HEP     PATIENT EDUCATION:  POC, diagnosis, prognosis, HEP, and outcome measures.  Pt educated via explanation, demonstration, and handout (HEP).  Pt confirms understanding verbally.    HOME EXERCISE PROGRAM: Access Code: [email or web address] URL: [email or web address] Date:  10/04/2022 Prepared by: Alphonzo Severance  Exercises - Shoulder External Rotation with Anchored Resistance  - 1 x daily - 7 x weekly - 3 sets - 10 reps - Standing Bicep Curls with Resistance  - 1 x daily - 7 x weekly - 3 sets - 10 reps - Supine Shoulder Flexion AAROM with Dowel  - 1 x daily - 7 x weekly - 3 sets - 10 reps - Supine Shoulder External Rotation in 45 Degrees Abduction AAROM with Dowel  - 1 x daily - 7 x weekly - 3 sets - 10 reps - Standing Shoulder Row with Anchored Resistance  - 1  x daily - 7 x weekly - 3 sets - 10 reps - Scaption Wall Slide with Towel  - 1 x daily - 7 x weekly - 3 sets - 10 reps  TREATMENT 4/25: Therapeutic Exercise: - UBE - 2'/2' - L2 - AAROM dowel flexion - @ 45 degree incline x10 - biceps curls - 3x10 - 4# - Rows Black TB 3x10 - shoulder ext - 3x10 - GTB - Pball roll up wall - 2x10  Manual Therapy (performed in reclined position on table, no supine): - GH joint mobs - PROM all directions with inferior and posterior glide  Modalities: Vasopneumatic (Game Ready)   Location:  right shoulder Time:  10 minutes Pressure:  medium Temperature:  44 degrees   TREATMENT 4/16 Therapeutic Exercise: - UBE - 2'/2' - L 2 - AAROM dowel flexion - @ 45 degree incline - biceps curls - 3x10 - 4# - Rows Black TB 3x10 - shoulder ext - 3x10 - GTB - Standing ER/IR - YTB/GTB with manual posterior GH glide - 15x - Pball roll up wall - 2x10   Manual Therapy (performed in reclined position on table, no supine): - GH joint mobs - PROM all directions with inferior and posterior glide  Modalities: Vasopneumatic (Game Ready)   Location:  right shoulder Time:  10 minutes Pressure:  medium Temperature:  44 degrees   TREATMENT 09/11/22:  REEVAL (not other charges this visit)  Therapeutic Exercise: - UBE - 3'/3' - L 2  Manual Therapy (performed in reclined position on table, no supine): - GH joint mobs - PROM all directions with inferior and posterior  glide  Modalities: Vasopneumatic (Game Ready)   Location:  right shoulder Time:  10 minutes Pressure:  medium Temperature:  44 degrees      ASSESSMENT:   CLINICAL IMPRESSION: Patient presents to PT reporting continued high levels of pain in her Rt shoulder, but states her motion has been improving. Session today continued to focus on gentle strengthening and manual techniques to decrease pain and improve ROM. Patient continues to benefit from skilled PT services and should be progressed as able to improve functional independence.    OBJECTIVE IMPAIRMENTS: Pain, shoulder ROM, shoulder strength   ACTIVITY LIMITATIONS: lifting, reaching, housework   PERSONAL FACTORS: See medical history and pertinent history     REHAB POTENTIAL: Fair chronic   CLINICAL DECISION MAKING: Stable/uncomplicated   EVALUATION COMPLEXITY: Low     GOALS:     SHORT TERM GOALS: Target date: 08/08/2022   Azizah will be >75% HEP compliant to improve carryover between sessions and facilitate independent management of condition   Evaluation (07/11/2022): ongoing Goal status: MET 1/30     LONG TERM GOALS: Target date: 09/05/2022 (extended to 11/06/2022)   [place] will show a >/= 15 pt improvement in her QUICK DASH score (MCID is 13.4 pts or 8%) as a proxy for functional improvement    Evaluation/Baseline (07/11/2022): 35/55 pts 3/12: QuickDASH Score: 34.1 / 100 = 34.1 % Goal status: INITIAL     2.  Ninfa will self report >/= 50% decrease in pain from evaluation    Evaluation/Baseline (07/11/2022): 10/10 max pain 1/30: 10/10 max, 4/10 min 2/13: 10/10 max, 4/10 min 3/12: 8/10 max, 3/10 min Goal status: ongoing     3.  Korryn will demonstrate >120 degrees of active ROM in flexion to allow completion of activities involving reaching OH, not limited by pain   Evaluation/Baseline (07/11/2022): 65 degrees passive, unable active 1/30: 60 AROM w/ pain  2/13: 65 AROM w/ pain 3/12: 84 AROM w/  pain 4/16: 92 AROM w/ pain Goal status: ongoing     4.  Sybol will be able to complete light to moderate housework, not limited by pain   Evaluation/Baseline (07/11/2022): limited 1/30: able to do light housework (wash dishes), but no moderate housework 3/12: still limited in moderate to heavy housework Goal status: ongoing     PLAN: PT FREQUENCY: 1-2x/week   PT DURATION: 8 weeks (Ending 11/06/2022 )   PLANNED INTERVENTIONS: Therapeutic exercises, Aquatic therapy, Therapeutic activity, Neuro Muscular re-education, Gait training, Patient/Family education, Joint mobilization, Dry Needling, Electrical stimulation, Spinal mobilization and/or manipulation, Moist heat, Taping, Vasopneumatic device, Ionotophoresis 4mg /ml Dexamethasone, and Manual therapy   PLAN FOR NEXT SESSION: MT for ROM, GH joint mobs, progressive biceps loading   Berta Minor, PTA 10/25/2022, 10:39 AM

## 2022-10-30 ENCOUNTER — Encounter: Payer: Self-pay | Admitting: Physical Therapy

## 2022-10-30 ENCOUNTER — Ambulatory Visit: Payer: Medicaid Other | Admitting: Physical Therapy

## 2022-10-30 DIAGNOSIS — M25511 Pain in right shoulder: Secondary | ICD-10-CM | POA: Diagnosis not present

## 2022-10-30 DIAGNOSIS — M6281 Muscle weakness (generalized): Secondary | ICD-10-CM

## 2022-10-30 DIAGNOSIS — G8929 Other chronic pain: Secondary | ICD-10-CM

## 2022-10-30 DIAGNOSIS — R600 Localized edema: Secondary | ICD-10-CM

## 2022-10-30 NOTE — Therapy (Signed)
OUTPATIENT PHYSICAL THERAPY TREATMENT NOTE   Patient Name: Lynn Estrada MRN: [id number] DOB:1961-01-12, 62 y.o., female Today's Date: 10/30/2022  PCP: Porfirio Oar, PA  REFERRING PROVIDER: Starr Sinclair, PA-C   END OF SESSION:   PT End of Session - 10/30/22 1000     Visit Number 16    Date for PT Re-Evaluation 11/06/22    Authorization Type Rolene Arbour - Quick[employer]    Authorization Time Period Approved 10 visits 07/11/22-09/09/22    8 visits approved 09/11/22-11/23/22    Authorization - Visit Number 7    Authorization - Number of Visits 8    PT Start Time 1000    PT Stop Time 1030   pt request to leave early for other appt   PT Time Calculation (min) 30 min    Activity Tolerance Patient tolerated treatment well;Patient limited by pain    Behavior During Therapy [facility] for tasks assessed/performed              Past Medical History:  Diagnosis Date   Anemia    Eczema    GERD (gastroesophageal reflux disease)    History of adenomatous polyp of colon    08-03-2016  tubular adenoma x2 and hyperplastic   History of chronic gastritis 08/03/2016   History of ectopic pregnancy 1988   s/p  left salpingectomy   History of endometriosis    History of kidney stones    History of partial nephrectomy    right pelvis for very large stone   History of pneumonia 07/02/2016   CAP   History of sepsis    07-01-2016  sepsis w/ pyelonephritis, CAP /   12-31-2016  urosepsis w/ kidney stone obstruction   History of uterine leiomyoma    Hypertension    Left ureteral stone    Nephrolithiasis    left obstructive stone and right non-obstructive stone  per CT 12-31-2016   Urgency of urination    Past Surgical History:  Procedure Laterality Date   ABDOMINAL HYSTERECTOMY  01/20/2007   w/ Lysis Adhesions/  Left salpingoophorectomy/  Right Salpingectomy   CHOLECYSTECTOMY  1994   and Right Partial Nephrectomy (pelvis for very large stone)   CYSTOSCOPY W/ URETERAL STENT PLACEMENT Left  12/31/2016   Procedure: CYSTOSCOPY WITH RETROGRADE PYELOGRAM/ LEFT URETERAL STENT PLACEMENT;  Surgeon: Sebastian Ache, MD;  Location: [facility];  Service: Urology;  Laterality: Left;   CYSTOSCOPY WITH RETROGRADE PYELOGRAM, URETEROSCOPY AND STENT PLACEMENT Left 01/18/2017   Procedure: 1ST STAGE CYSTOSCOPY WITH RETROGRADE PYELOGRAM, URETEROSCOPY AND STENT REPLACEMENT;  Surgeon: Sebastian Ache, MD;  Location: [facility];  Service: Urology;  Laterality: Left;   CYSTOSCOPY WITH RETROGRADE PYELOGRAM, URETEROSCOPY AND STENT PLACEMENT Left 02/01/2017   Procedure: 2ND STAGE CYSTOSCOPY WITH RETROGRADE PYELOGRAM, URETEROSCOPY AND STENT REPLACEMENT;  Surgeon: Sebastian Ache, MD;  Location: [facility];  Service: Urology;  Laterality: Left;   ECTOPIC PREGNANCY SURGERY  1988   Left Salpingectomy   HOLMIUM LASER APPLICATION Left 01/18/2017   Procedure: HOLMIUM LASER APPLICATION;  Surgeon: Sebastian Ache, MD;  Location: [facility];  Service: Urology;  Laterality: Left;   HOLMIUM LASER APPLICATION Left 02/01/2017   Procedure: HOLMIUM LASER APPLICATION;  Surgeon: Sebastian Ache, MD;  Location: [facility];  Service: Urology;  Laterality: Left;   LUMBAR DISC SURGERY  01/1999   right L5 -- S1   RE-EXPLORATION LUMBAR/  LAMINECTOMY AND MICRODISKECTOMY  10/14/2001   right L5 -- S1   RIGHT HEART CATH N/A 05/23/2022  Procedure: RIGHT HEART CATH;  Surgeon: Dolores Patty, MD;  Location: [facility] INVASIVE CV LAB;  Service: Cardiovascular;  Laterality: N/A;   RIGHT/LEFT HEART CATH AND CORONARY ANGIOGRAPHY N/A 07/18/2021   Procedure: RIGHT/LEFT HEART CATH AND CORONARY ANGIOGRAPHY;  Surgeon: Dolores Patty, MD;  Location: [facility] INVASIVE CV LAB;  Service: Cardiovascular;  Laterality: N/A;   TUBAL LIGATION Bilateral 10/17/1999   PPTL   UMBILICAL HERNIA REPAIR  age 39   Patient Active Problem List   Diagnosis Date Noted   LBBB (left bundle branch block) 02/01/2022    Chronic combined systolic and diastolic heart failure (HCC) 07/27/2021   Acute combined systolic (congestive) and diastolic (congestive) heart failure (HCC)    Hypocalcemia 07/12/2021   Elevated troponin 07/12/2021   Suspected COVID-19 virus infection 06/11/2019   Acute respiratory failure with hypoxia (HCC) 06/11/2019   Multifocal pneumonia 06/11/2019   Ureteral stone with hydronephrosis 12/31/2016   Normocytic anemia 07/10/2016   Essential hypertension 07/26/2015   Eczema 07/26/2015   Overweight (BMI 25.0-29.9) 07/26/2015    REFERRING DIAG: right shoulder pain   THERAPY DIAG:  Chronic right shoulder pain  Muscle weakness  Localized edema  Rationale for Evaluation and Treatment Rehabilitation  PERTINENT HISTORY: Heart failure (does not tolerate reclined positions), history of heart attack   PRECAUTIONS: Heart failure (does not tolerate reclined positions); Adhesive capsulitis (?)  SUBJECTIVE:                                                                                                                                                                                      SUBJECTIVE STATEMENT:  Pt reports that her shoulder continues to be painful.  She is able to complete some light household chores, but this is still painful.    PAIN:  Are you having pain? 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                               SENSATION:          Light touch: Appears intact           PALPATION: Exquisite TTP R anterior shoulder and LH biceps tendon   UPPER  EXTREMITY AROM:   ROM Right  07/11/2022 Left 07/11/2022 R 3/12 R 4/4 R 4/16  Shoulder flexion 65 135 82 110 AAROM 92  AROM  Shoulder abduction 65 135 80    Shoulder internal rotation         Shoulder external rotation         Functional IR To greater troch T1     Functional ER To ear* T4*     Shoulder extension         Elbow extension         Elbow flexion           (Blank rows = not tested, N = WNL, * = concordant pain with testing)   UPPER EXTREMITY MMT:   MMT Right 07/11/2022 Left 07/11/2022  Shoulder flexion Unable d/t pain    Shoulder abduction (C5) Unable d/t pain    Shoulder ER 4 4  Shoulder IR 3* 4  Middle trapezius      Lower trapezius      Shoulder extension      Grip strength      Cervical flexion (C1,C2)      Cervical S/B (C3)      Shoulder shrug (C4)      Elbow flexion (C6) 3*    Elbow ext (C7)      Thumb ext (C8)      Finger abd (T1)      Grossly        (Blank rows = not tested, score listed is out of 5 possible points.  N = WNL, D = diminished, C = clear for gross weakness with myotome testing, * = concordant pain with testing)   UPPER EXTREMITY PROM:   PROM Right 07/11/2022 Left 07/11/2022 R 2/13 R 4/2  Shoulder flexion 90   95 100  Shoulder abduction 85   85   Shoulder internal rotation        Shoulder external rotation at 45 ~30 degrees   20 35  Functional IR        Functional ER        Shoulder extension        Elbow extension        Elbow flexion          (Blank rows = not tested, N = WNL, * = concordant pain with testing)   SHOULDER SPECIAL TESTS:           RC test cluster: + painful arc, - ER, - drop arm           Yergason's Test (+)     PATIENT SURVEYS:  Quick Dash 36/55              TODAY'S TREATMENT:  Creating, reviewing, and completing below HEP     PATIENT EDUCATION:  POC, diagnosis, prognosis, HEP, and outcome measures.  Pt educated via explanation, demonstration, and handout (HEP).  Pt confirms understanding verbally.     HOME EXERCISE PROGRAM: Access Code: [email or web address] URL: [email or web address] Date: 10/04/2022 Prepared by: Alphonzo Severance  Exercises - Shoulder External Rotation with Anchored Resistance  - 1 x daily - 7 x weekly - 3 sets - 10 reps - Standing Bicep Curls with Resistance  - 1 x daily - 7 x weekly - 3 sets - 10 reps - Supine Shoulder Flexion AAROM with Dowel  - 1 x daily - 7 x weekly - 3 sets - 10 reps - Supine Shoulder External Rotation in 45 Degrees Abduction AAROM with Dowel  - 1 x daily -  7 x weekly - 3 sets - 10 reps - Standing Shoulder Row with Anchored Resistance  - 1 x daily - 7 x weekly - 3 sets - 10 reps - Scaption Wall Slide with Towel  - 1 x daily - 7 x weekly - 3 sets - 10 reps  TREATMENT 4/30: Therapeutic Exercise: - UBE - 2'/2' - L2 - AAROM dowel flexion - @ 45 degree incline x10 (NT) - biceps curls - 3x10 - 4# - Rows Black TB 3x10 - shoulder ext - 3x10 - GTB - Pball roll up wall - 2x10 (NT)  Manual Therapy (performed in reclined position on table, no supine): - GH joint mobs - PROM all directions with inferior and posterior glide  Modalities (NOT TODAY): Vasopneumatic (Game Ready)   Location:  right shoulder Time:  10 minutes Pressure:  medium Temperature:  44 degrees   TREATMENT 4/16 Therapeutic Exercise: - UBE - 2'/2' - L 2 - AAROM dowel flexion - @ 45 degree incline - biceps curls - 3x10 - 4# - Rows Black TB 3x10 - shoulder ext - 3x10 - GTB - Standing ER/IR - YTB/GTB with manual posterior GH glide - 15x - Pball roll up wall - 2x10   Manual Therapy (performed in reclined position on table, no supine): - GH joint mobs - PROM all directions with inferior and posterior glide  Modalities: Vasopneumatic (Game Ready)   Location:  right shoulder Time:  10 minutes Pressure:  medium Temperature:  44 degrees   TREATMENT 09/11/22:  REEVAL (not other charges this visit)  Therapeutic Exercise: - UBE - 3'/3' - L 2  Manual Therapy  (performed in reclined position on table, no supine): - GH joint mobs - PROM all directions with inferior and posterior glide  Modalities: Vasopneumatic (Game Ready)   Location:  right shoulder Time:  10 minutes Pressure:  medium Temperature:  44 degrees      ASSESSMENT:   CLINICAL IMPRESSION: Lindalee tolerated session well with no adverse reaction.  Continue to work on ROM followed by strengthening.  Pt requests to leave early d/t other appt so visit is truncated.  Plan on D/C next visit with HEP.   OBJECTIVE IMPAIRMENTS: Pain, shoulder ROM, shoulder strength   ACTIVITY LIMITATIONS: lifting, reaching, housework   PERSONAL FACTORS: See medical history and pertinent history     REHAB POTENTIAL: Fair chronic   CLINICAL DECISION MAKING: Stable/uncomplicated   EVALUATION COMPLEXITY: Low     GOALS:     SHORT TERM GOALS: Target date: 08/08/2022   [place] will be >75% HEP compliant to improve carryover between sessions and facilitate independent management of condition   Evaluation (07/11/2022): ongoing Goal status: MET 1/30     LONG TERM GOALS: Target date: 09/05/2022 (extended to 11/06/2022)   [place] will show a >/= 15 pt improvement in her QUICK DASH score (MCID is 13.4 pts or 8%) as a proxy for functional improvement    Evaluation/Baseline (07/11/2022): 35/55 pts 3/12: QuickDASH Score: 34.1 / 100 = 34.1 % Goal status: INITIAL     2.  Lakesha will self report >/= 50% decrease in pain from evaluation    Evaluation/Baseline (07/11/2022): 10/10 max pain 1/30: 10/10 max, 4/10 min 2/13: 10/10 max, 4/10 min 3/12: 8/10 max, 3/10 min Goal status: ongoing     3.  Jeraline will demonstrate >120 degrees of active ROM in flexion to allow completion of activities involving reaching OH, not limited by pain   Evaluation/Baseline (07/11/2022): 65 degrees passive, unable active 1/30:  60 AROM w/ pain 2/13: 65 AROM w/ pain 3/12: 84 AROM w/ pain 4/16: 92 AROM w/ pain Goal  status: ongoing     4.  [place] will be able to complete light to moderate housework, not limited by pain   Evaluation/Baseline (07/11/2022): limited 1/30: able to do light housework (wash dishes), but no moderate housework 3/12: still limited in moderate to heavy housework Goal status: ongoing     PLAN: PT FREQUENCY: 1-2x/week   PT DURATION: 8 weeks (Ending 11/06/2022 )   PLANNED INTERVENTIONS: Therapeutic exercises, Aquatic therapy, Therapeutic activity, Neuro Muscular re-education, Gait training, Patient/Family education, Joint mobilization, Dry Needling, Electrical stimulation, Spinal mobilization and/or manipulation, Moist heat, Taping, Vasopneumatic device, Ionotophoresis 4mg /ml Dexamethasone, and Manual therapy   PLAN FOR NEXT SESSION: MT for ROM, GH joint mobs, progressive biceps loading   Fredderick Phenix, PT 10/30/2022, 10:35 AM

## 2022-11-01 ENCOUNTER — Ambulatory Visit: Payer: Medicaid Other | Attending: Physician Assistant

## 2022-11-01 DIAGNOSIS — R600 Localized edema: Secondary | ICD-10-CM | POA: Diagnosis present

## 2022-11-01 DIAGNOSIS — M25511 Pain in right shoulder: Secondary | ICD-10-CM | POA: Diagnosis present

## 2022-11-01 DIAGNOSIS — M6281 Muscle weakness (generalized): Secondary | ICD-10-CM | POA: Insufficient documentation

## 2022-11-01 DIAGNOSIS — G8929 Other chronic pain: Secondary | ICD-10-CM | POA: Diagnosis present

## 2022-11-01 NOTE — Therapy (Addendum)
PHYSICAL THERAPY DISCHARGE SUMMARY  Visits from Start of Care: 17  Current functional level related to goals / functional outcomes: See assessment/goals   Remaining deficits: See assessment/goals   Education / Equipment: HEP and D/C plans  Patient agrees to discharge. Patient goals were met. Patient is being discharged due to meeting the stated rehab goals.  OUTPATIENT PHYSICAL THERAPY TREATMENT NOTE   Patient Name: Lynn Estrada MRN: [id number] DOB:[birth year], 62 y.o., female Today's Date: 11/01/2022  PCP: Porfirio Oar, PA  REFERRING PROVIDER: Starr Sinclair, PA-C   END OF SESSION:   PT End of Session - 11/01/22 1529     Visit Number 17    Date for PT Re-Evaluation 11/06/22    Authorization Type Rolene Arbour - Berneda Rose    Authorization Time Period Approved 10 visits 07/11/22-09/09/22    8 visits approved 09/11/22-11/23/22    Authorization - Visit Number 8    Authorization - Number of Visits 8    PT Start Time 1530    PT Stop Time 1610    PT Time Calculation (min) 40 min    Activity Tolerance Patient tolerated treatment well;Patient limited by pain    Behavior During Therapy [facility] for tasks assessed/performed               Past Medical History:  Diagnosis Date   Anemia    Eczema    GERD (gastroesophageal reflux disease)    History of adenomatous polyp of colon    08-03-2016  tubular adenoma x2 and hyperplastic   History of chronic gastritis 08/03/2016   History of ectopic pregnancy 1988   s/p  left salpingectomy   History of endometriosis    History of kidney stones    History of partial nephrectomy    right pelvis for very large stone   History of pneumonia 07/02/2016   CAP   History of sepsis    07-01-2016  sepsis w/ pyelonephritis, CAP /   12-31-2016  urosepsis w/ kidney stone obstruction   History of uterine leiomyoma    Hypertension    Left ureteral stone    Nephrolithiasis    left obstructive stone and right non-obstructive stone  per CT  12-31-2016   Urgency of urination    Past Surgical History:  Procedure Laterality Date   ABDOMINAL HYSTERECTOMY  01/20/2007   w/ Lysis Adhesions/  Left salpingoophorectomy/  Right Salpingectomy   CHOLECYSTECTOMY  1994   and Right Partial Nephrectomy (pelvis for very large stone)   CYSTOSCOPY W/ URETERAL STENT PLACEMENT Left 12/31/2016   Procedure: CYSTOSCOPY WITH RETROGRADE PYELOGRAM/ LEFT URETERAL STENT PLACEMENT;  Surgeon: Sebastian Ache, MD;  Location: [facility];  Service: Urology;  Laterality: Left;   CYSTOSCOPY WITH RETROGRADE PYELOGRAM, URETEROSCOPY AND STENT PLACEMENT Left 01/18/2017   Procedure: 1ST STAGE CYSTOSCOPY WITH RETROGRADE PYELOGRAM, URETEROSCOPY AND STENT REPLACEMENT;  Surgeon: Sebastian Ache, MD;  Location: [facility];  Service: Urology;  Laterality: Left;   CYSTOSCOPY WITH RETROGRADE PYELOGRAM, URETEROSCOPY AND STENT PLACEMENT Left 02/01/2017   Procedure: 2ND STAGE CYSTOSCOPY WITH RETROGRADE PYELOGRAM, URETEROSCOPY AND STENT REPLACEMENT;  Surgeon: Sebastian Ache, MD;  Location: [facility];  Service: Urology;  Laterality: Left;   ECTOPIC PREGNANCY SURGERY  1988   Left Salpingectomy   HOLMIUM LASER APPLICATION Left 01/18/2017   Procedure: HOLMIUM LASER APPLICATION;  Surgeon: Sebastian Ache, MD;  Location: [facility];  Service: Urology;  Laterality: Left;   HOLMIUM LASER APPLICATION Left 02/01/2017   Procedure: HOLMIUM LASER APPLICATION;  Surgeon:  Sebastian Ache, MD;  Location: [facility];  Service: Urology;  Laterality: Left;   LUMBAR DISC SURGERY  01/1999   right L5 -- S1   RE-EXPLORATION LUMBAR/  LAMINECTOMY AND MICRODISKECTOMY  10/14/2001   right L5 -- S1   RIGHT HEART CATH N/A 05/23/2022   Procedure: RIGHT HEART CATH;  Surgeon: Dolores Patty, MD;  Location: [facility];  Service: Cardiovascular;  Laterality: N/A;   RIGHT/LEFT HEART CATH AND CORONARY ANGIOGRAPHY N/A 07/18/2021   Procedure:  RIGHT/LEFT HEART CATH AND CORONARY ANGIOGRAPHY;  Surgeon: Dolores Patty, MD;  Location: [facility];  Service: Cardiovascular;  Laterality: N/A;   TUBAL LIGATION Bilateral 10/17/1999   PPTL   UMBILICAL HERNIA REPAIR  age 59   Patient Active Problem List   Diagnosis Date Noted   LBBB (left bundle branch block) 02/01/2022   Chronic combined systolic and diastolic heart failure (HCC) 07/27/2021   Acute combined systolic (congestive) and diastolic (congestive) heart failure (HCC)    Hypocalcemia 07/12/2021   Elevated troponin 07/12/2021   Suspected COVID-19 virus infection 06/11/2019   Acute respiratory failure with hypoxia (HCC) 06/11/2019   Multifocal pneumonia 06/11/2019   Ureteral stone with hydronephrosis 12/31/2016   Normocytic anemia 07/10/2016   Essential hypertension 07/26/2015   Eczema 07/26/2015   Overweight (BMI 25.0-29.9) 07/26/2015    REFERRING DIAG: right shoulder pain   THERAPY DIAG:  Chronic right shoulder pain  Muscle weakness  Localized edema  Rationale for Evaluation and Treatment Rehabilitation  PERTINENT HISTORY: Heart failure (does not tolerate reclined positions), history of heart attack   PRECAUTIONS: Heart failure (does not tolerate reclined positions); Adhesive capsulitis (?)  SUBJECTIVE:                                                                                                                                                                                      SUBJECTIVE STATEMENT:  Patient reports that she felt a pop in her arm recently and she's been able to move it more since then.    PAIN:  Are you having pain? 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  SENSATION:          Light touch: Appears intact           PALPATION: Exquisite TTP R anterior shoulder and LH biceps tendon   UPPER EXTREMITY AROM:   ROM Right 07/11/2022 Left 07/11/2022 R 3/12 R 4/4 R 4/16  Shoulder flexion 65 135 82 110 AAROM 92  AROM  Shoulder abduction 65 135 80    Shoulder internal rotation         Shoulder external rotation         Functional IR To greater troch T1     Functional ER To ear* T4*     Shoulder extension         Elbow extension         Elbow flexion           (Blank rows = not tested, N = WNL, * = concordant pain with testing)   UPPER EXTREMITY MMT:   MMT Right 07/11/2022 Left 07/11/2022  Shoulder flexion Unable d/t pain    Shoulder abduction (C5) Unable d/t pain    Shoulder ER 4 4  Shoulder IR 3* 4  Middle trapezius      Lower trapezius      Shoulder extension      Grip strength      Cervical flexion (C1,C2)      Cervical S/B (C3)      Shoulder shrug (C4)      Elbow flexion (C6) 3*    Elbow ext (C7)      Thumb ext (C8)      Finger abd (T1)      Grossly        (Blank rows = not tested, score listed is out of 5 possible points.  N = WNL, D = diminished, C = clear for gross weakness with myotome testing, * = concordant pain with testing)   UPPER EXTREMITY PROM:   PROM Right 07/11/2022 Left 07/11/2022 R 2/13 R 4/2 R 5/2  Shoulder flexion 90   95 100 110  Shoulder abduction 85   85    Shoulder internal rotation         Shoulder external rotation at 45 ~30 degrees   20 35   Functional IR         Functional ER         Shoulder extension         Elbow extension         Elbow flexion           (Blank rows = not tested, N = WNL, * = concordant pain with testing)   SHOULDER SPECIAL TESTS:           RC test cluster: + painful arc, - ER, - drop arm            Yergason's Test (+)     PATIENT SURVEYS:  Quick Dash 36/55 18.2/100 18.2%              TODAY'S TREATMENT:  Creating, reviewing, and completing below HEP     PATIENT EDUCATION:  POC, diagnosis, prognosis, HEP, and outcome measures.  Pt educated via explanation, demonstration, and handout (HEP).  Pt confirms understanding verbally.    HOME EXERCISE PROGRAM: Access Code: [email or web address] URL: [email or web address] Date: 11/01/2022 Prepared by: Berta Minor  Exercises - Supine Shoulder Flexion AAROM with Dowel  - 1 x daily - 7 x weekly - 3 sets - 10 reps - Supine Shoulder External Rotation  in 45 Degrees Abduction AAROM with Dowel  - 1 x daily - 7 x weekly - 3 sets - 10 reps - Scaption Wall Slide with Towel  - 1 x daily - 7 x weekly - 3 sets - 10 reps - Shoulder External Rotation with Anchored Resistance  - 1 x daily - 7 x weekly - 3 sets - 10 reps - Standing Bicep Curls with Resistance  - 1 x daily - 7 x weekly - 3 sets - 10 reps - Standing Shoulder Row with Anchored Resistance  - 1 x daily - 7 x weekly - 3 sets - 10 reps - Shoulder extension with resistance - Neutral  - 1 x daily - 7 x weekly - 3 sets - 10 reps - Shoulder External Rotation and Scapular Retraction with Resistance  - 1 x daily - 7 x weekly - 3 sets - 10 reps  TREATMENT 5/2: Therapeutic Exercise: - AAROM dowel ER/flexion - @ 45 degree incline x10 each - double ER GTB x10  Therapeutic Activity: - Updating, reassessing goals - Update and review of HEP   TREATMENT 4/30: Therapeutic Exercise: - UBE - 2'/2' - L2 - AAROM dowel flexion - @ 45 degree incline x10 (NT) - biceps curls - 3x10 - 4# - Rows Black TB 3x10 - shoulder ext - 3x10 - GTB - Pball roll up wall - 2x10 (NT)  Manual Therapy (performed in reclined position on table, no supine): - GH joint mobs - PROM all directions with inferior and posterior glide  Modalities (NOT TODAY): Vasopneumatic (Game Ready)   Location:  right  shoulder Time:  10 minutes Pressure:  medium Temperature:  44 degrees   TREATMENT 4/16 Therapeutic Exercise: - UBE - 2'/2' - L 2 - AAROM dowel flexion - @ 45 degree incline - biceps curls - 3x10 - 4# - Rows Black TB 3x10 - shoulder ext - 3x10 - GTB - Standing ER/IR - YTB/GTB with manual posterior GH glide - 15x - Pball roll up wall - 2x10   Manual Therapy (performed in reclined position on table, no supine): - GH joint mobs - PROM all directions with inferior and posterior glide  Modalities: Vasopneumatic (Game Ready)   Location:  right shoulder Time:  10 minutes Pressure:  medium Temperature:  44 degrees     ASSESSMENT:   CLINICAL IMPRESSION: Patient presents to PT reporting that she felt a "pop" in her Rt shoulder while she was stretching today and that since then she has been able to move her arm around easier, and her pain has been less. Her ROM has improved greatly since beginning PT with 110 of flexion AROM achieved today, just shy of LTG. She has met her LTG around pain with maximum at 5/10. She has also met her QuickDASH goal showing improvement in functional mobility and she reports that she is no longer limited with light to moderate housework. Updated and reviewed HEP with patient. Answered patient's questions regarding return to PT if needed over next couple of months. Patient is appropriate for DC at this time.  OBJECTIVE IMPAIRMENTS: Pain, shoulder ROM, shoulder strength   ACTIVITY LIMITATIONS: lifting, reaching, housework   PERSONAL FACTORS: See medical history and pertinent history     REHAB POTENTIAL: Fair chronic   CLINICAL DECISION MAKING: Stable/uncomplicated   EVALUATION COMPLEXITY: Low     GOALS:     SHORT TERM GOALS: Target date: 08/08/2022   [place] will be >75% HEP compliant to improve carryover between sessions and facilitate  independent management of condition   Evaluation (07/11/2022): ongoing Goal status: MET 1/30     LONG TERM  GOALS: Target date: 09/05/2022 (extended to 11/06/2022)   [place] will show a >/= 15 pt improvement in her QUICK DASH score (MCID is 13.4 pts or 8%) as a proxy for functional improvement    Evaluation/Baseline (07/11/2022): 35/55 pts 3/12: QuickDASH Score: 34.1 / 100 = 34.1 % 11/01/22: 18.2%  Goal status: MET     2.  Kimberlye will self report >/= 50% decrease in pain from evaluation    Evaluation/Baseline (07/11/2022): 10/10 max pain 1/30: 10/10 max, 4/10 min 2/13: 10/10 max, 4/10 min 3/12: 8/10 max, 3/10 min Goal status: MET Pt reports max pain at 5/10 11/01/22     3.  Jansyn will demonstrate >120 degrees of active ROM in flexion to allow completion of activities involving reaching OH, not limited by pain   Evaluation/Baseline (07/11/2022): 65 degrees passive, unable active 1/30: 60 AROM w/ pain 2/13: 65 AROM w/ pain 3/12: 84 AROM w/ pain 4/16: 92 AROM w/ pain 5/2: 110 AROM w/ mild pain Goal status: PARTIALLY MET     4.  Sheyna will be able to complete light to moderate housework, not limited by pain   Evaluation/Baseline (07/11/2022): limited 1/30: able to do light housework (wash dishes), but no moderate housework 3/12: still limited in moderate to heavy housework 11/01/22: no longer limited in light/moderate Goal status: MET     PLAN: PT FREQUENCY: 1-2x/week   PT DURATION: 8 weeks (Ending 11/06/2022 )   PLANNED INTERVENTIONS: Therapeutic exercises, Aquatic therapy, Therapeutic activity, Neuro Muscular re-education, Gait training, Patient/Family education, Joint mobilization, Dry Needling, Electrical stimulation, Spinal mobilization and/or manipulation, Moist heat, Taping, Vasopneumatic device, Ionotophoresis 4mg /ml Dexamethasone, and Manual therapy   PLAN FOR NEXT SESSION: MT for ROM, GH joint mobs, progressive biceps loading   Berta Minor, PTA 11/01/2022, 3:39 PM

## 2022-11-02 ENCOUNTER — Other Ambulatory Visit (HOSPITAL_COMMUNITY): Payer: Self-pay

## 2022-11-02 DIAGNOSIS — I5022 Chronic systolic (congestive) heart failure: Secondary | ICD-10-CM

## 2022-11-06 ENCOUNTER — Telehealth (HOSPITAL_COMMUNITY): Payer: Self-pay

## 2022-11-06 NOTE — Telephone Encounter (Signed)
Patient called and asked if you have received the disability paperwork sent yesterday. She said it has to be done by the 15th. I reminded her of our time frames for filling out paperwork as well.

## 2022-11-13 ENCOUNTER — Telehealth (HOSPITAL_COMMUNITY): Payer: Self-pay

## 2022-11-13 NOTE — Telephone Encounter (Signed)
Patient called to see if we had her disability forms done?  She said it is supposed to be in by the 15th and she wanted to follow up.

## 2022-11-13 NOTE — Telephone Encounter (Signed)
Forms completed, signed by Dr Gala Romney, and faxed into [place] at [phone]

## 2022-11-14 NOTE — Telephone Encounter (Signed)
Spoke to patient and updated her on status. 

## 2022-11-19 ENCOUNTER — Encounter: Payer: Self-pay | Admitting: Internal Medicine

## 2022-12-04 ENCOUNTER — Ambulatory Visit (AMBULATORY_SURGERY_CENTER): Payer: Medicaid Other

## 2022-12-04 ENCOUNTER — Other Ambulatory Visit: Payer: Self-pay

## 2022-12-04 VITALS — Ht 73.0 in | Wt 205.0 lb

## 2022-12-04 DIAGNOSIS — Z86010 Personal history of colonic polyps: Secondary | ICD-10-CM

## 2022-12-04 MED ORDER — NA SULFATE-K SULFATE-MG SULF 17.5-3.13-1.6 GM/177ML PO SOLN: 1.0000 | Freq: Once | ORAL | 0 refills | Status: AC

## 2022-12-04 NOTE — Progress Notes (Signed)
Denies allergies to eggs or soy products. Denies complication of anesthesia or sedation. Denies use of weight loss medication. Denies use of O2.   Emmi instructions given for colonoscopy.  

## 2022-12-06 ENCOUNTER — Ambulatory Visit (HOSPITAL_BASED_OUTPATIENT_CLINIC_OR_DEPARTMENT_OTHER)
Admission: RE | Admit: 2022-12-06 | Discharge: 2022-12-06 | Disposition: A | Discharge status: Home / Self Care | Payer: Medicaid Other | Source: Ambulatory Visit | Attending: Internal Medicine | Admitting: Internal Medicine

## 2022-12-06 ENCOUNTER — Ambulatory Visit (HOSPITAL_COMMUNITY)
Admission: RE | Admit: 2022-12-06 | Discharge: 2022-12-06 | Disposition: A | Discharge status: Home / Self Care | Payer: Medicaid Other | Source: Ambulatory Visit | Attending: Physician Assistant | Admitting: Physician Assistant

## 2022-12-06 ENCOUNTER — Encounter (HOSPITAL_COMMUNITY): Payer: Self-pay | Admitting: Internal Medicine

## 2022-12-06 VITALS — BP 100/60 | HR 87 | Wt 209.6 lb

## 2022-12-06 DIAGNOSIS — I252 Old myocardial infarction: Secondary | ICD-10-CM | POA: Diagnosis not present

## 2022-12-06 DIAGNOSIS — Z79899 Other long term (current) drug therapy: Secondary | ICD-10-CM | POA: Insufficient documentation

## 2022-12-06 DIAGNOSIS — I447 Left bundle-branch block, unspecified: Secondary | ICD-10-CM | POA: Insufficient documentation

## 2022-12-06 DIAGNOSIS — I340 Nonrheumatic mitral (valve) insufficiency: Secondary | ICD-10-CM | POA: Insufficient documentation

## 2022-12-06 DIAGNOSIS — I428 Other cardiomyopathies: Secondary | ICD-10-CM | POA: Diagnosis not present

## 2022-12-06 DIAGNOSIS — E785 Hyperlipidemia, unspecified: Secondary | ICD-10-CM | POA: Insufficient documentation

## 2022-12-06 DIAGNOSIS — I110 Hypertensive heart disease with heart failure: Secondary | ICD-10-CM | POA: Insufficient documentation

## 2022-12-06 DIAGNOSIS — R0683 Snoring: Secondary | ICD-10-CM | POA: Insufficient documentation

## 2022-12-06 DIAGNOSIS — E119 Type 2 diabetes mellitus without complications: Secondary | ICD-10-CM | POA: Diagnosis not present

## 2022-12-06 DIAGNOSIS — M7501 Adhesive capsulitis of right shoulder: Secondary | ICD-10-CM | POA: Insufficient documentation

## 2022-12-06 DIAGNOSIS — Z7984 Long term (current) use of oral hypoglycemic drugs: Secondary | ICD-10-CM | POA: Diagnosis not present

## 2022-12-06 DIAGNOSIS — I5042 Chronic combined systolic (congestive) and diastolic (congestive) heart failure: Secondary | ICD-10-CM | POA: Insufficient documentation

## 2022-12-06 DIAGNOSIS — I5022 Chronic systolic (congestive) heart failure: Secondary | ICD-10-CM

## 2022-12-06 DIAGNOSIS — J449 Chronic obstructive pulmonary disease, unspecified: Secondary | ICD-10-CM | POA: Diagnosis not present

## 2022-12-06 DIAGNOSIS — I509 Heart failure, unspecified: Secondary | ICD-10-CM

## 2022-12-06 DIAGNOSIS — I2510 Atherosclerotic heart disease of native coronary artery without angina pectoris: Secondary | ICD-10-CM | POA: Diagnosis not present

## 2022-12-06 HISTORY — DX: Heart failure, unspecified: I50.9

## 2022-12-06 LAB — BASIC METABOLIC PANEL
Anion gap: 8 (ref 5–15)
BUN: 15 mg/dL (ref 6–20)
CO2: 28 mmol/L (ref 22–32)
Calcium: 9.1 mg/dL (ref 8.9–10.3)
Chloride: 106 mmol/L (ref 98–111)
Creatinine, Ser: 1.14 mg/dL — ABNORMAL HIGH (ref 0.44–1.00)
GFR, Estimated: 55 mL/min — ABNORMAL LOW (ref >60)
Glucose, Bld: 95 mg/dL (ref 70–99)
Potassium: 4.1 mmol/L (ref 3.5–5.1)
Sodium: 142 mmol/L (ref 135–145)

## 2022-12-06 LAB — ECHOCARDIOGRAM COMPLETE
Area-P 1/2: 6.17 cm2
Calc EF: 29.1 %
P 1/2 time: 375 msec
S' Lateral: 6.6 cm
Single Plane A2C EF: 31.4 %
Single Plane A4C EF: 28.9 %

## 2022-12-06 NOTE — Progress Notes (Signed)
Echocardiogram 2D Echocardiogram has been performed.  Lynn Estrada 12/06/2022, 2:57 PM

## 2022-12-06 NOTE — Progress Notes (Signed)
PCP: Olene Floss, [facility] Primary Cardiologist: Dr Sharyn Lull HF MD: Dr Gala Romney   HPI: [name] is a 62 y.o.with a history of HTN, GERD, nephrolithiasis, partial right nephrectomy, LBBB, and combined diastolic/systolic HF.      Admitted 1/23 with acute HFrEF. 07/13/21 Echo 20-25%, RV normal, grade II DD , and septal - lateral dyssnchrony. R/L cath w/ Minimal CAD, mildly elevated filling pressures and normal CO. cMRI ? Myocarditis (see impression below). LVEF 18%. Placed on Jardiance and spiro. Not on bb with acute decompensation. No ARNi with hypotension. Discharged on 07/20/21.   Echo 10/25/21 EF 30-35%  RHC 11/23 RA = 6 RV = 30/10  PA = 35/21 (29) PCW = 20  Fick cardiac output/index = 4.8/2.3 Thermo CO/CI = 5.2/2.4 PVR = 1.9 WU FA sat = 96% PA sat = 62%  CPX 12/23 - submax test. Non diagnostic.  Time 2:45 FVC 2.14 (58% predicted)      FEV1 1.82 (64% predicted)        FEV1/FVC 85%        MVV 65 (59% predicted)  BP rest: 100/74 BP standing: 94/72 BP peak: 92/70  Peak VO2: 8.4 (40.9% predicted peak VO2)  VE/VCO2 slope:  33 Peak RER: 0.85   Today she returns for HF follow up. Complaining of fatigue. SOB with exertion.  Noticed some issues swallowing. Denies +Orthopnea. Denies PND. Appetite ok. No fever or chills. Weight at home 203-206 pounds. Taking all medications.   ROS: All systems negative except as listed in HPI, PMH and Problem List.  SH:  Social History   Socioeconomic History   Marital status: Married    Spouse name: Richard Suleiman   Number of children: 1   Years of education: college   Highest education level: Not on file  Occupational History   Occupation: Product manager  Tobacco Use   Smoking status: Former    Packs/day: 0.75    Years: 5.00    Additional pack years: 0.00    Total pack years: 3.75    Types: Cigarettes    Quit date: 01/10/1997    Years since quitting: 25.9   Smokeless tobacco: Never  Vaping Use   Vaping Use:  Never used  Substance and Sexual Activity   Alcohol use: Not Currently    Comment: social use; once a month   Drug use: No   Sexual activity: Yes    Partners: Male    Birth control/protection: Surgical  Other Topics Concern   Not on file  Social History Narrative   Lives with her husband and their daughter and their dog.   Social Determinants of Health   Financial Resource Strain: Medium Risk (07/27/2021)   Overall Financial Resource Strain (CARDIA)    Difficulty of Paying Living Expenses: Somewhat hard  Food Insecurity: Food Insecurity Present (07/27/2021)   Hunger Vital Sign    Worried About Running Out of Food in the Last Year: Sometimes true    Ran Out of Food in the Last Year: Never true  Transportation Needs: No Transportation Needs (07/20/2021)   PRAPARE - Administrator, Civil Service (Medical): No    Lack of Transportation (Non-Medical): No  Physical Activity: Not on file  Stress: Not on file  Social Connections: Not on file  Intimate Partner Violence: Not on file   FH:  Family History  Problem Relation Age of Onset   Sarcoidosis Mother    Prostate cancer Father    Hypertension Sister  Eczema Sister    Lupus Sister    Breast cancer Maternal Aunt    Gout Maternal Grandmother    Diabetes Maternal Grandmother        leg amputations   Stomach cancer Neg Hx    Colon cancer Neg Hx    Esophageal cancer Neg Hx    Rectal cancer Neg Hx    Past Medical History:  Diagnosis Date   Anemia    Arthritis    CHF (congestive heart failure) (HCC)    COPD (chronic obstructive pulmonary disease) (HCC)    Diabetes mellitus without complication (HCC)    Eczema    GERD (gastroesophageal reflux disease)    History of adenomatous polyp of colon    08-03-2016  tubular adenoma x2 and hyperplastic   History of chronic gastritis 08/03/2016   History of ectopic pregnancy 1988   s/p  left salpingectomy   History of endometriosis    History of kidney stones    History  of partial nephrectomy    right pelvis for very large stone   History of pneumonia 07/02/2016   CAP   History of sepsis    07-01-2016  sepsis w/ pyelonephritis, CAP /   12-31-2016  urosepsis w/ kidney stone obstruction   History of uterine leiomyoma    Hyperlipidemia    Hypertension    Left ureteral stone    Myocardial infarction [facility])    Nephrolithiasis    left obstructive stone and right non-obstructive stone  per CT 12-31-2016   Urgency of urination    Current Outpatient Medications  Medication Sig Dispense Refill   acetaminophen (TYLENOL) 500 MG tablet Take 500-1,000 mg by mouth every 6 (six) hours as needed (for pain.).     aspirin 81 MG EC tablet Take 81 mg by mouth daily with lunch.     atorvastatin (LIPITOR) 10 MG tablet Take 10 mg by mouth daily.     digoxin (LANOXIN) 0.125 MG tablet Take 1 tablet (125 mcg total) by mouth daily. 90 tablet 3   empagliflozin (JARDIANCE) 10 MG TABS tablet Take 1 tablet (10 mg total) by mouth daily. 30 tablet 2   furosemide (LASIX) 40 MG tablet Take 40 mg by mouth daily.     Multiple Vitamin (MULTIVITAMIN WITH MINERALS) TABS tablet Take 1 tablet by mouth daily with lunch. One A Day for Women 50+     sacubitril-valsartan (ENTRESTO) 24-26 MG Take 1 tablet by mouth 2 (two) times daily. 60 tablet 11   spironolactone (ALDACTONE) 25 MG tablet TAKE 1 TABLET (25 MG TOTAL) BY MOUTH DAILY. 90 tablet 1   No current facility-administered medications for this encounter.   BP 100/60   Pulse 87   Wt 95.1 kg (209 lb 9.6 oz)   SpO2 97%   BMI 27.65 kg/m   Wt Readings from Last 3 Encounters:  12/06/22 95.1 kg (209 lb 9.6 oz)  12/04/22 93 kg (205 lb)  09/07/22 90.7 kg (200 lb)   PHYSICAL EXAM: General:  Well appearing. No resp difficulty HEENT: normal Neck: supple. no JVD. Carotids 2+ bilat; no bruits. No lymphadenopathy or thryomegaly appreciated. Cor: PMI nondisplaced. Regular rate & rhythm. No rubs, gallops or murmurs. Lungs: clear Abdomen: soft,  nontender, nondistended. No hepatosplenomegaly. No bruits or masses. Good bowel sounds. Extremities: no cyanosis, clubbing, rash, edema Neuro: alert & orientedx3, cranial nerves grossly intact. moves all 4 extremities w/o difficulty. Affect pleasant  ASSESSMENT & PLAN:  Chronic Combined Systolic/Diastolic HF, NICM.  - 07/13/21 Echo 20-25%,  RV normal, grade II DD , and septal -lateral dyssnchrony - 07/2021 cMRI possible myocarditis  LVEF 18% Suggestive of LBBB, RVEF 33% - [facility] 07/2021 min CAD. RA 6 PCWP 21, CO 6.8/ CI 3.1 - Echo(4/23): EF 30-35% -> EF improving but still with significant LV dysfunction (suspect resolving myocarditis) - Echo 10/23 EF < 20% RV ok  -RHC 11/23 w/ Mildly elevated filling pressures with mildly decreased CO - CPX 12/23 - submax test. Non diagnostic.  - Echo today EF 20-25%  RV normal. No significant change.  - LBBB, QRS  176 ms. Refer to EP.  - NYHA IIIB . Volume status stable.  - Continue digoxin 0.125 mg daily.  - Continue spironolactone 25 mg daily  Continue entresto 24-26 mg twice a day,  - Continue Jardiance 10 mg daily.  - No bb with fatigue.   2. HTN - Stable.   3. LBBB - LBBB, QRS 176 ms - Refer to EP for possible CRT-D   4. Partial R Nephrectomy - Labs stable during last check, will recheck  5. Snoring/daytime fatigue - Deferred sleep study  6. R shoulder  Frozen shoulder. Followed by PT.    Refer to EP for urgent visit for possible CRT-D.  Follow up 6 weeks.   Amy Clegg NP-C  3:07 PM  Patient seen and examined with the above-signed Advanced Practice Provider and/or Housestaff. I personally reviewed laboratory data, imaging studies and relevant notes. I independently examined the patient and formulated the important aspects of the plan. I have edited the note to reflect any of my changes or salient points. I have personally discussed the plan with the patient and/or family.  Continues to struggle NYHA IIIB despite good meds. Volume  ok. Echo with EF ~20% RV ok. + LBBB dyssynchrony. (Personally reviewed)  General:  Fatigued appearing. No resp difficulty HEENT: normal Neck: supple. no JVD. Carotids 2+ bilat; no bruits. No lymphadenopathy or thryomegaly appreciated. Cor: PMI laterally displaced. Regular rate & rhythm. No rubs, gallops or murmurs. Lungs: clear Abdomen: soft, nontender, nondistended. No hepatosplenomegaly. No bruits or masses. Good bowel sounds. Extremities: no cyanosis, clubbing, rash, edema Neuro: alert & orientedx3, cranial nerves grossly intact. moves all 4 extremities w/o difficulty. Affect pleasant  She has severe NICM. Suspect LBBB CM. Will need CRT-D ASAP. If EF does not improve will need transplant/LVAD eval. Labs today. Refer EP.   Arvilla Meres, MD  4:05 PM

## 2022-12-06 NOTE — Patient Instructions (Signed)
There has been no changes to your medications.  Labs done today, your results will be available in MyChart, we will contact you for abnormal readings.  You have been referred to the Electrophysiologist. They will call you to arrange your appointment.  Your physician recommends that you schedule a follow-up appointment in: 6 weeks  If you have any questions or concerns before your next appointment please send [place] a message through [place] or call our office at [phone].    TO LEAVE A MESSAGE FOR THE NURSE SELECT OPTION 2, PLEASE LEAVE A MESSAGE INCLUDING: YOUR NAME DATE OF BIRTH CALL BACK NUMBER REASON FOR CALL**this is important as we prioritize the call backs  YOU WILL RECEIVE A CALL BACK THE SAME DAY AS LONG AS YOU CALL BEFORE 4:00 PM  At the [facility], you and your health needs are our priority. As part of our continuing mission to provide you with exceptional heart care, we have created designated Provider Care Teams. These Care Teams include your primary Cardiologist (physician) and Advanced Practice Providers (APPs- Physician Assistants and Nurse Practitioners) who all work together to provide you with the care you need, when you need it.   You may see any of the following providers on your designated Care Team at your next follow up: Dr Arvilla Meres Dr Marca Ancona Dr. Marcos Eke, NP Robbie Lis, [place] [facility] [place], [place] Brynda Peon, NP Karle Plumber, PharmD   Please be sure to bring in all your medications bottles to every appointment.    Thank you for choosing [facility]

## 2022-12-16 ENCOUNTER — Telehealth: Payer: Self-pay | Admitting: *Deleted

## 2022-12-16 ENCOUNTER — Encounter: Payer: Self-pay | Admitting: Certified Registered Nurse Anesthetist

## 2022-12-16 NOTE — Telephone Encounter (Signed)
Dr. Leone Payor,  This pt is scheduled with you on 6/19.  Her cardiac EF is below the LEC standard, and her procedure will need to be done at the hospital.  Thanks,  Cathlyn Parsons

## 2022-12-17 ENCOUNTER — Other Ambulatory Visit: Payer: Self-pay

## 2022-12-17 DIAGNOSIS — Z86010 Personal history of colonic polyps: Secondary | ICD-10-CM

## 2022-12-17 NOTE — Telephone Encounter (Signed)
Pt made aware of Dr. Leone Payor recommendations: Colonoscopy scheduled for 12/19/2022 canceled. Pt made aware. Pt stated that she could do 02/14/2023 appt.  Pt was notified that I will contact her with the detail of that appointment later today. Pt verbalized understanding with all questions answered.

## 2022-12-17 NOTE — Telephone Encounter (Signed)
Pt was scheduled for a colonoscopy on 02/14/2023 at 9:30 AM at [facility] with Dr. Leone Payor. Case ID number [id number] Pt needs a previsit appointment: Left message for pt to call back

## 2022-12-17 NOTE — Telephone Encounter (Signed)
Please let patient know - she may have an appt 8/15 if that works (at [employer])  Other option is 9/3

## 2022-12-18 NOTE — Telephone Encounter (Signed)
Pt was made aware that she had been scheduled for a colonoscopy on 02/14/2023 at 9:30 AM at [facility] with Dr. Leone Payor.  Pt was scheduled for a previsit on 01/10/2023 at 9:00. Pt made aware. Location given' Pt verbalized understanding with all questions answered.

## 2022-12-19 ENCOUNTER — Encounter: Payer: Medicaid Other | Admitting: Internal Medicine

## 2022-12-21 ENCOUNTER — Encounter: Payer: Self-pay | Admitting: Cardiovascular Disease

## 2022-12-21 ENCOUNTER — Ambulatory Visit: Payer: Medicaid Other | Attending: Cardiovascular Disease | Admitting: Cardiovascular Disease

## 2022-12-21 VITALS — BP 106/66 | HR 86 | Ht 74.0 in | Wt 205.8 lb

## 2022-12-21 DIAGNOSIS — I447 Left bundle-branch block, unspecified: Secondary | ICD-10-CM | POA: Diagnosis not present

## 2022-12-21 LAB — BASIC METABOLIC PANEL
BUN: 20 mg/dL (ref 8–27)
CO2: 26 mmol/L (ref 20–29)
Glucose: 132 mg/dL — ABNORMAL HIGH (ref 70–99)
Sodium: 141 mmol/L (ref 134–144)

## 2022-12-21 LAB — CBC WITH DIFFERENTIAL/PLATELET

## 2022-12-21 NOTE — Progress Notes (Signed)
  Electrophysiology Office Note:    Date:  12/21/2022   ID:  Lynn Estrada, DOB 27-Jun-1961, MRN [id number]  PCP:  Porfirio Oar, PA   [facility] Providers Cardiologist:  None     Referring MD: Dolores Patty, MD   History of Present Illness:    Lynn Estrada is a 62 y.o. female with a medical history significant for CHFrEF, LBBB, partial right nephrectomy, hypertension referred for placement of a CRT defibrillator.     She was admitted in January 2023 with acute heart failure with reduced ejection fraction, EF 20-25%.  Cath showed minimal coronary disease.  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  GDMT was started as tolerated and has been titrated up and outpatient follow-up.     Today, she reports that she is doing well. She has shortness of breath with exertion and is easily fatigued, but these symptoms are at baseline.  EKGs/Labs/Other Studies Reviewed Today:    Echocardiogram:  TTE 12/06/22 EF 20 to 25%  TTE 07/13/2021 EF 20 to 25%   Monitors:   Stress testing:   Advanced imaging:  07/19/2021 EF 18% with small mid myocardial basal lateral LGE.   Cardiac catherization  07/18/2021 Minimal non obstructive CAD   EKG:   EKG Interpretation  Date/Time:  Friday December 21 2022 08:36:12 EDT Ventricular Rate:  86 PR Interval:  172 QRS Duration: 170 QT Interval:  420 QTC Calculation: 502 R Axis:   -63 Text Interpretation: Normal sinus rhythm Possible Left atrial enlargement Left axis deviation Left bundle branch block When compared with ECG of 01-Aug-2022 13:52, No significant change was found Confirmed by York Pellant [phone]) on 12/21/2022 8:44:13 AM     Physical Exam:    VS:  BP 106/66   Pulse 86   Ht 6\' 2"  (1.88 m)   Wt 205 lb 12.8 oz (93.4 kg)   SpO2 95%   BMI 26.42 kg/m     Wt Readings from Last 3 Encounters:  12/21/22 205 lb 12.8 oz (93.4 kg)  12/06/22 209 lb 9.6 oz (95.1 kg)  12/04/22  205 lb (93 kg)     GEN:  Well nourished, well developed in no acute distress CARDIAC: RRR, no murmurs, rubs, gallops RESPIRATORY:  Normal work of breathing MUSCULOSKELETAL: no edema    ASSESSMENT & PLAN:    CHFrEF EF is persistently less than 25% despite greater than 90 days of GDMT NYHA III She meets criteria for ICD Continue sacubitril/valsartan 24 to 26 mg, furosemide 40 mg, digoxin 125 mcg  LBBB Will place CRT D device  Partial right nephrectomy Mild CKD; will minimize contrast use   I discussed the indication for the procedure and the logistics, risks, potential benefit, and after care. I specifically explained that risks include but are not limited to infection, bleeding,damage to blood vessels, lung, and the heart -- but risk of prolonged hospitalization, need for surgery, or the event of stroke, heart attack, or death are low but not zero.    Signed, Maurice Small, MD  12/21/2022 8:44 AM     [facility]

## 2022-12-21 NOTE — H&P (View-Only) (Signed)
  Electrophysiology Office Note:    Date:  12/21/2022   ID:  Lynn Estrada, DOB 27-Jun-1961, MRN [id number]  PCP:  Lynn Oar, PA   [facility] Providers Cardiologist:  None     Referring MD: Dolores Patty, MD   History of Present Illness:    Lynn Estrada is a 62 y.o. female with a medical history significant for CHFrEF, LBBB, partial right nephrectomy, hypertension referred for placement of a CRT defibrillator.     She was admitted in January 2023 with acute heart failure with reduced ejection fraction, EF 20-25%.  Cath showed minimal coronary disease.  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  GDMT was started as tolerated and has been titrated up and outpatient follow-up.     Today, she reports that she is doing well. She has shortness of breath with exertion and is easily fatigued, but these symptoms are at baseline.  EKGs/Labs/Other Studies Reviewed Today:    Echocardiogram:  TTE 12/06/22 EF 20 to 25%  TTE 07/13/2021 EF 20 to 25%   Monitors:   Stress testing:   Advanced imaging:  07/19/2021 EF 18% with small mid myocardial basal lateral LGE.   Cardiac catherization  07/18/2021 Minimal non obstructive CAD   EKG:   EKG Interpretation  Date/Time:  Friday December 21 2022 08:36:12 EDT Ventricular Rate:  86 PR Interval:  172 QRS Duration: 170 QT Interval:  420 QTC Calculation: 502 R Axis:   -63 Text Interpretation: Normal sinus rhythm Possible Left atrial enlargement Left axis deviation Left bundle branch block When compared with ECG of 01-Aug-2022 13:52, No significant change was found Confirmed by York Pellant [phone]) on 12/21/2022 8:44:13 AM     Physical Exam:    VS:  BP 106/66   Pulse 86   Ht 6\' 2"  (1.88 m)   Wt 205 lb 12.8 oz (93.4 kg)   SpO2 95%   BMI 26.42 kg/m     Wt Readings from Last 3 Encounters:  12/21/22 205 lb 12.8 oz (93.4 kg)  12/06/22 209 lb 9.6 oz (95.1 kg)  12/04/22  205 lb (93 kg)     GEN:  Well nourished, well developed in no acute distress CARDIAC: RRR, no murmurs, rubs, gallops RESPIRATORY:  Normal work of breathing MUSCULOSKELETAL: no edema    ASSESSMENT & PLAN:    CHFrEF EF is persistently less than 25% despite greater than 90 days of GDMT NYHA III She meets criteria for ICD Continue sacubitril/valsartan 24 to 26 mg, furosemide 40 mg, digoxin 125 mcg  LBBB Will place CRT D device  Partial right nephrectomy Mild CKD; will minimize contrast use   I discussed the indication for the procedure and the logistics, risks, potential benefit, and after care. I specifically explained that risks include but are not limited to infection, bleeding,damage to blood vessels, lung, and the heart -- but risk of prolonged hospitalization, need for surgery, or the event of stroke, heart attack, or death are low but not zero.    Signed, Maurice Small, MD  12/21/2022 8:44 AM     [facility]

## 2022-12-21 NOTE — Patient Instructions (Signed)
Medication Instructions:  Your physician recommends that you continue on your current medications as directed. Please refer to the Current Medication list given to you today. *If you need a refill on your cardiac medications before your next appointment, please call your pharmacy*   Lab Work: CBC and BMET If you have labs (blood work) drawn today and your tests are completely normal, you will receive your results only by: MyChart Message (if you have MyChart) OR A paper copy in the mail If you have any lab test that is abnormal or we need to change your treatment, we will call you to review the results.   Testing/Procedures: ICD placement  Your physician has recommended that you have a defibrillator inserted. An implantable cardioverter defibrillator (ICD) is a small device that is placed in your chest or, in rare cases, your abdomen. This device uses electrical pulses or shocks to help control life-threatening, irregular heartbeats that could lead the heart to suddenly stop beating (sudden cardiac arrest). Leads are attached to the ICD that goes into your heart. This is done in the hospital and usually requires an overnight stay. Please see the instruction sheet given to you today for more information.    Follow-Up: At [facility], you and your health needs are our priority.  As part of our continuing mission to provide you with exceptional heart care, we have created designated Provider Care Teams.  These Care Teams include your primary Cardiologist (physician) and Advanced Practice Providers (APPs -  Physician Assistants and Nurse Practitioners) who all work together to provide you with the care you need, when you need it.  We recommend signing up for the patient portal called "MyChart".  Sign up information is provided on this After Visit Summary.  MyChart is used to connect with patients for Virtual Visits (Telemedicine).  Patients are able to view lab/test results, encounter  notes, upcoming appointments, etc.  Non-urgent messages can be sent to your provider as well.   To learn more about what you can do with MyChart, go to [email or web address].    Your next appointment:   We will schedule follow up after ICD implant   Provider:   York Pellant, MD

## 2022-12-21 NOTE — Addendum Note (Signed)
Addended by: Sherle Poe R on: 12/21/2022 09:04 AM   Modules accepted: Orders

## 2022-12-22 LAB — CBC WITH DIFFERENTIAL/PLATELET
Basophils Absolute: 0 10*3/uL (ref 0.0–0.2)
Basos: 1 %
EOS (ABSOLUTE): 0.1 10*3/uL (ref 0.0–0.4)
Hematocrit: 37.1 % (ref 34.0–46.6)
Hemoglobin: 12 g/dL (ref 11.1–15.9)
Immature Granulocytes: 0 %
Lymphocytes Absolute: 1.7 10*3/uL (ref 0.7–3.1)
Lymphs: 31 %
MCH: 28.2 pg (ref 26.6–33.0)
MCHC: 32.3 g/dL (ref 31.5–35.7)
MCV: 87 fL (ref 79–97)
Monocytes Absolute: 0.5 10*3/uL (ref 0.1–0.9)
Monocytes: 8 %
Platelets: 258 10*3/uL (ref 150–450)
RDW: 12.8 % (ref 11.7–15.4)

## 2022-12-22 LAB — BASIC METABOLIC PANEL
BUN/Creatinine Ratio: 16 (ref 12–28)
Calcium: 9.4 mg/dL (ref 8.7–10.3)
Chloride: 103 mmol/L (ref 96–106)
Creatinine, Ser: 1.24 mg/dL — ABNORMAL HIGH (ref 0.57–1.00)
Potassium: 4.9 mmol/L (ref 3.5–5.2)
eGFR: 50 mL/min/{1.73_m2} — ABNORMAL LOW (ref >59)

## 2022-12-31 ENCOUNTER — Other Ambulatory Visit (HOSPITAL_COMMUNITY): Payer: Self-pay | Admitting: Internal Medicine

## 2022-12-31 ENCOUNTER — Other Ambulatory Visit (HOSPITAL_COMMUNITY): Payer: Self-pay | Admitting: Family Medicine

## 2023-01-02 ENCOUNTER — Telehealth: Payer: Self-pay

## 2023-01-02 NOTE — Telephone Encounter (Signed)
Cancel current appointments - let's have her see me in office in an open sept spot (RN or banding) and set her up for 9/24 at hospital also

## 2023-01-02 NOTE — Telephone Encounter (Signed)
Pt has previsit on 01/10/2023 for Colonoscopy to be done at hospital on 02/14/2023 Chart was reviewed by Previsit RN Pt is currently seeing Cardiology. Last OV was on 12/21/2022 with Dr. Nelly Laurence. Plan is to place ICD  on 01/14/2023 and then be scheduled for a  follow up appt after that. Please advise if pt is to continue as scheduled with 02/14/2023 date for Colonoscopy or if we should reschedule.

## 2023-01-04 NOTE — Telephone Encounter (Signed)
Spoke with patient & rescheduled procedure to 03/26/23 at 7:30 am at [facility]. OV scheduled for 03/07/23 at 2:50 pm with Dr. Leone Payor.

## 2023-01-07 NOTE — Telephone Encounter (Signed)
Noted - will remove PV chart and cancel appt;

## 2023-01-13 NOTE — Pre-Procedure Instructions (Signed)
Instructed patient on the following items: Arrival time 0830 Nothing to eat or drink after midnight No meds AM of procedure Responsible person to drive you home and stay with you for 24 hrs Wash with special soap night before and morning of procedure  

## 2023-01-14 ENCOUNTER — Other Ambulatory Visit: Payer: Self-pay

## 2023-01-14 ENCOUNTER — Ambulatory Visit (HOSPITAL_COMMUNITY): Payer: Medicaid Other

## 2023-01-14 ENCOUNTER — Ambulatory Visit (HOSPITAL_COMMUNITY)
Admission: RE | Admit: 2023-01-14 | Discharge: 2023-01-14 | Disposition: A | Discharge status: Home / Self Care | Payer: Medicaid Other | Attending: Cardiovascular Disease | Admitting: Cardiovascular Disease

## 2023-01-14 ENCOUNTER — Ambulatory Visit (HOSPITAL_COMMUNITY)
Admission: RE | Disposition: A | Discharge status: Home / Self Care | Payer: Self-pay | Source: Home / Self Care | Attending: Cardiovascular Disease

## 2023-01-14 DIAGNOSIS — I130 Hypertensive heart and chronic kidney disease with heart failure and stage 1 through stage 4 chronic kidney disease, or unspecified chronic kidney disease: Secondary | ICD-10-CM | POA: Diagnosis not present

## 2023-01-14 DIAGNOSIS — Z95810 Presence of automatic (implantable) cardiac defibrillator: Secondary | ICD-10-CM | POA: Diagnosis not present

## 2023-01-14 DIAGNOSIS — N189 Chronic kidney disease, unspecified: Secondary | ICD-10-CM | POA: Insufficient documentation

## 2023-01-14 DIAGNOSIS — I447 Left bundle-branch block, unspecified: Secondary | ICD-10-CM | POA: Diagnosis not present

## 2023-01-14 DIAGNOSIS — Z79899 Other long term (current) drug therapy: Secondary | ICD-10-CM | POA: Insufficient documentation

## 2023-01-14 DIAGNOSIS — I5022 Chronic systolic (congestive) heart failure: Secondary | ICD-10-CM | POA: Diagnosis not present

## 2023-01-14 DIAGNOSIS — Z905 Acquired absence of kidney: Secondary | ICD-10-CM | POA: Diagnosis not present

## 2023-01-14 HISTORY — PX: BIV ICD INSERTION CRT-D: EP1195

## 2023-01-14 LAB — GLUCOSE, CAPILLARY
Glucose-Capillary: 146 mg/dL — ABNORMAL HIGH (ref 70–99)
Glucose-Capillary: 152 mg/dL — ABNORMAL HIGH (ref 70–99)

## 2023-01-14 LAB — ECHOCARDIOGRAM LIMITED
Est EF: <20
Height: 74 in
Weight: 3280 oz

## 2023-01-14 SURGERY — BIV ICD INSERTION CRT-D

## 2023-01-14 MED ORDER — LIDOCAINE HCL (PF) 1 % IJ SOLN
INTRAMUSCULAR | Status: AC
  Filled 2023-01-14: qty 60

## 2023-01-14 MED ORDER — ALUM & MAG HYDROXIDE-SIMETH 200-200-20 MG/5ML PO SUSP
30.0000 mL | Freq: Once | ORAL | Status: AC
  Administered 2023-01-14: 30 mL via ORAL
  Filled 2023-01-14: qty 30

## 2023-01-14 MED ORDER — MIDAZOLAM HCL 2 MG/2ML IJ SOLN
INTRAMUSCULAR | Status: AC
  Filled 2023-01-14: qty 2

## 2023-01-14 MED ORDER — MIDAZOLAM HCL 5 MG/5ML IJ SOLN
INTRAMUSCULAR | Status: DC | PRN
  Administered 2023-01-14 (×4): 1 mg via INTRAVENOUS

## 2023-01-14 MED ORDER — ONDANSETRON HCL 4 MG/2ML IJ SOLN
4.0000 mg | Freq: Four times a day (QID) | INTRAMUSCULAR | Status: DC | PRN
  Administered 2023-01-14: 4 mg via INTRAVENOUS
  Filled 2023-01-14: qty 2

## 2023-01-14 MED ORDER — HEPARIN (PORCINE) IN NACL 1000-0.9 UT/500ML-% IV SOLN
INTRAVENOUS | Status: DC | PRN
  Administered 2023-01-14: 500 mL

## 2023-01-14 MED ORDER — SODIUM CHLORIDE 0.9 % IV SOLN: INTRAVENOUS | Status: DC

## 2023-01-14 MED ORDER — SODIUM CHLORIDE 0.9 % IV SOLN
80.0000 mg | INTRAVENOUS | Status: AC
  Administered 2023-01-14: 80 mg

## 2023-01-14 MED ORDER — FENTANYL CITRATE (PF) 100 MCG/2ML IJ SOLN
INTRAMUSCULAR | Status: DC | PRN
  Administered 2023-01-14 (×4): 25 ug via INTRAVENOUS

## 2023-01-14 MED ORDER — ACETAMINOPHEN 325 MG PO TABS
325.0000 mg | ORAL_TABLET | ORAL | Status: DC | PRN
  Administered 2023-01-14: 650 mg via ORAL
  Filled 2023-01-14: qty 2

## 2023-01-14 MED ORDER — POVIDONE-IODINE 10 % EX SWAB
2.0000 | Freq: Once | CUTANEOUS | Status: AC
  Administered 2023-01-14: 2 via TOPICAL

## 2023-01-14 MED ORDER — CEFAZOLIN SODIUM-DEXTROSE 2-4 GM/100ML-% IV SOLN
2.0000 g | INTRAVENOUS | Status: AC
  Administered 2023-01-14: 2 g via INTRAVENOUS

## 2023-01-14 MED ORDER — CALCIUM CARBONATE ANTACID 500 MG PO CHEW
2.0000 | CHEWABLE_TABLET | Freq: Once | ORAL | Status: DC
  Filled 2023-01-14: qty 2

## 2023-01-14 MED ORDER — CEFAZOLIN SODIUM-DEXTROSE 2-4 GM/100ML-% IV SOLN
INTRAVENOUS | Status: AC
  Filled 2023-01-14: qty 100

## 2023-01-14 MED ORDER — IOHEXOL 350 MG/ML SOLN
INTRAVENOUS | Status: DC | PRN
  Administered 2023-01-14: 10 mL

## 2023-01-14 MED ORDER — LIDOCAINE HCL (PF) 1 % IJ SOLN
INTRAMUSCULAR | Status: DC | PRN
  Administered 2023-01-14: 60 mL

## 2023-01-14 MED ORDER — FENTANYL CITRATE (PF) 100 MCG/2ML IJ SOLN
INTRAMUSCULAR | Status: AC
  Filled 2023-01-14: qty 2

## 2023-01-14 MED ORDER — SODIUM CHLORIDE 0.9 % IV SOLN
INTRAVENOUS | Status: AC
  Filled 2023-01-14: qty 2

## 2023-01-14 MED ORDER — PHENOL 1.4 % MT LIQD
1.0000 | OROMUCOSAL | Status: DC | PRN
  Administered 2023-01-14: 1 via OROMUCOSAL
  Filled 2023-01-14: qty 177

## 2023-01-14 SURGICAL SUPPLY — 17 items
BALLN COR SINUS VENO 6FR 80 (BALLOONS) ×1
BALLOON COR SINUS VENO 6FR 80 (BALLOONS) IMPLANT
CABLE SURGICAL S-101-97-12 (CABLE) ×1 IMPLANT
CATH CPS DIRECT 135 DS2C020 (CATHETERS) IMPLANT
ICD GALLANT HFCRTD CDHFA500Q (ICD Generator) IMPLANT
KIT ESSENTIALS PG (KITS) IMPLANT
KIT MICROPUNCTURE NIT STIFF (SHEATH) IMPLANT
LEAD DURATA 7122Q-65CM (Lead) IMPLANT
LEAD QUARTET 1458Q-86CM (Lead) IMPLANT
LEAD ULTIPACE 52 LPA1231/52 (Lead) IMPLANT
PAD DEFIB RADIO PHYSIO CONN (PAD) ×1 IMPLANT
SHEATH 7FR PRELUDE SNAP 13 (SHEATH) IMPLANT
SHEATH 9.5FR PRELUDE SNAP 13 (SHEATH) IMPLANT
SLITTER AGILIS HISPRO (INSTRUMENTS) IMPLANT
TRAY PACEMAKER INSERTION (PACKS) ×1 IMPLANT
WIRE ACUITY WHISPER EDS 4648 (WIRE) IMPLANT
WIRE HI TORQ VERSACORE-J 145CM (WIRE) IMPLANT

## 2023-01-14 NOTE — Interval H&P Note (Signed)
History and Physical Interval Note:  01/14/2023 9:25 AM  Lynn Estrada  has presented today for surgery, with the diagnosis of left bundle branch block.  The various methods of treatment have been discussed with the patient and family. After consideration of risks, benefits and other options for treatment, the patient has consented to  Procedure(s): BIV ICD INSERTION CRT-D (N/A) as a surgical intervention.  The patient's history has been reviewed, patient examined, no change in status, stable for surgery.  I have reviewed the patient's chart and labs.  Questions were answered to the patient's satisfaction.     Roberts Gaudy Demiyah Fischbach

## 2023-01-14 NOTE — Progress Notes (Signed)
Pt with c/o CP/heart burn. >> given maalox She tells me that she is nauseous, feels burning up her throat with definite sour taste, nausea as well Drinking some sips of ginger ale cooled off her throat pain some Sitting up feels better, gets worse when she lays down VSS No rub, heart sounds are not distant/muffled EKG is SR/V paced with no ECG evidence of effusion Device check with stable measurements and no phrenic stim. She is now s/p Zofran with some improvement, in her throat burning but burning now has settled to her epigrastrum/abdomen Onset of all of the symptoms after she ate a [place] sandwich.  Will go ahead and get a astate echo to r/o effusion  Francis Dowse, PA-C

## 2023-01-14 NOTE — Progress Notes (Signed)
  Echocardiogram 2D Echocardiogram has been performed.  Lynn Estrada 01/14/2023, 3:23 PM

## 2023-01-14 NOTE — Progress Notes (Signed)
Pt c/o of chest tightness and throat burning, emesis x 2, Dr Nelly Laurence aware of condition prior to emesis. Edson Snowball is here to evaluate

## 2023-01-14 NOTE — Discharge Instructions (Signed)
After Your ICD (Implantable Cardiac Defibrillator)   You have a Abbott ICD  ACTIVITY Do not lift your arm above shoulder height for 1 week after your procedure. After 7 days, you may progress as below.  You should remove your sling 24 hours after your procedure, unless otherwise instructed by your provider.     Monday January 21, 2023  Tuesday January 22, 2023 Wednesday January 23, 2023 Thursday January 24, 2023   Do not lift, push, pull, or carry anything over 10 pounds with the affected arm until 6 weeks (Monday February 25, 2023 ) after your procedure.   You may drive AFTER your wound check, unless you have been told otherwise by your provider.   Ask your healthcare provider when you can go back to work   INCISION/Dressing   If large square, outer bandage is left in place, this can be removed after 24 hours from your procedure. Do not remove steri-strips or glue as below.   Monitor your defibrillator site for redness, swelling, and drainage. Call the device clinic at [phone] if you experience these symptoms or fever/chills.  If your incision is sealed with Steri-strips or staples, you may shower 7 days after your procedure or when told by your provider. Do not remove the steri-strips or let the shower hit directly on your site. You may wash around your site with soap and water.    If you were discharged in a sling, please do not wear this during the day more than 48 hours after your surgery unless otherwise instructed. This may increase the risk of stiffness and soreness in your shoulder.   Avoid lotions, ointments, or perfumes over your incision until it is well-healed.  You may use a hot tub or a pool AFTER your wound check appointment if the incision is completely closed.  Your ICD is designed to protect you from life threatening heart rhythms. Because of this, you may receive a shock.   1 shock with no symptoms:  Call the office during business hours. 1 shock with symptoms (chest  pain, chest pressure, dizziness, lightheadedness, shortness of breath, overall feeling unwell):  Call 911. If you experience 2 or more shocks in 24 hours:  Call 911. If you receive a shock, you should not drive for 6 months per the [place] DMV IF you receive appropriate therapy from your ICD.   ICD Alerts:  Some alerts are vibratory and others beep. These are NOT emergencies. Please call our office to let us know. If this occurs at night or on weekends, it can wait until the next business day. Send a remote transmission.  If your device is capable of reading fluid status (for heart failure), you will be offered monthly monitoring to review this with you.   DEVICE MANAGEMENT Remote monitoring is used to monitor your ICD from home. This monitoring is scheduled every 91 days by our office. It allows [place] to keep an eye on the functioning of your device to ensure it is working properly. You will routinely see your Electrophysiologist annually (more often if necessary).   You should receive your ID card for your new device in 4-8 weeks. Keep this card with you at all times once received. Consider wearing a medical alert bracelet or necklace.  Your ICD  may be MRI compatible. This will be discussed at your next office visit/wound check.  You should avoid contact with strong electric or magnetic fields.   Do not use amateur (ham) radio equipment or electric (  arc) welding torches. MP3 player headphones with magnets should not be used. Some devices are safe to use if held at least 12 inches (30 cm) from your defibrillator. These include power tools, lawn mowers, and speakers. If you are unsure if something is safe to use, ask your health care provider.  When using your cell phone, hold it to the ear that is on the opposite side from the defibrillator. Do not leave your cell phone in a pocket over the defibrillator.  You may safely use electric blankets, heating pads, computers, and microwave ovens.  Call the  office right away if: You have chest pain. You feel more than one shock. You feel more short of breath than you have felt before. You feel more light-headed than you have felt before. Your incision starts to open up.  This information is not intended to replace advice given to you by your health care provider. Make sure you discuss any questions you have with your health care provider.

## 2023-01-15 ENCOUNTER — Telehealth: Payer: Self-pay

## 2023-01-15 ENCOUNTER — Encounter (HOSPITAL_COMMUNITY): Payer: Self-pay | Admitting: Cardiovascular Disease

## 2023-01-15 MED FILL — Midazolam HCl Inj 2 MG/2ML (Base Equivalent): INTRAMUSCULAR | Qty: 2 | Status: AC

## 2023-01-15 NOTE — Telephone Encounter (Signed)
-----   Message from Sheilah Pigeon sent at 01/14/2023  3:10 PM EDT ----- Same day d/c Abbott CRT-D AM No a/c  renee

## 2023-01-15 NOTE — Telephone Encounter (Signed)
Follow-up after same day discharge: Implant date: 01/14/23 MD: Dr. York Pellant Device: Abbott [id number] [place] HF Location: Left Chest   Wound check visit: 01/30/2023 @ 3:20 AM 90 day MD follow-up: 04/16/2023 @ 11:45 AM  Remote Transmission received:Yes  Dressing/sling removed: Will remove this evening  Confirm OAC restart on: N/A

## 2023-01-17 ENCOUNTER — Ambulatory Visit (HOSPITAL_COMMUNITY)
Admission: RE | Admit: 2023-01-17 | Discharge: 2023-01-17 | Disposition: A | Discharge status: Home / Self Care | Payer: Medicaid Other | Source: Ambulatory Visit | Attending: Internal Medicine | Admitting: Internal Medicine

## 2023-01-17 ENCOUNTER — Encounter (HOSPITAL_COMMUNITY): Payer: Self-pay | Admitting: Internal Medicine

## 2023-01-17 VITALS — BP 108/70 | HR 91 | Wt 208.4 lb

## 2023-01-17 DIAGNOSIS — Z905 Acquired absence of kidney: Secondary | ICD-10-CM

## 2023-01-17 DIAGNOSIS — Z95810 Presence of automatic (implantable) cardiac defibrillator: Secondary | ICD-10-CM | POA: Diagnosis not present

## 2023-01-17 DIAGNOSIS — Z7984 Long term (current) use of oral hypoglycemic drugs: Secondary | ICD-10-CM | POA: Diagnosis not present

## 2023-01-17 DIAGNOSIS — I428 Other cardiomyopathies: Secondary | ICD-10-CM | POA: Diagnosis not present

## 2023-01-17 DIAGNOSIS — I110 Hypertensive heart disease with heart failure: Secondary | ICD-10-CM | POA: Diagnosis not present

## 2023-01-17 DIAGNOSIS — Z79899 Other long term (current) drug therapy: Secondary | ICD-10-CM | POA: Diagnosis not present

## 2023-01-17 DIAGNOSIS — Z8249 Family history of ischemic heart disease and other diseases of the circulatory system: Secondary | ICD-10-CM | POA: Insufficient documentation

## 2023-01-17 DIAGNOSIS — Z87891 Personal history of nicotine dependence: Secondary | ICD-10-CM | POA: Insufficient documentation

## 2023-01-17 DIAGNOSIS — I2510 Atherosclerotic heart disease of native coronary artery without angina pectoris: Secondary | ICD-10-CM | POA: Insufficient documentation

## 2023-01-17 DIAGNOSIS — I10 Essential (primary) hypertension: Secondary | ICD-10-CM | POA: Diagnosis not present

## 2023-01-17 DIAGNOSIS — I447 Left bundle-branch block, unspecified: Secondary | ICD-10-CM | POA: Diagnosis not present

## 2023-01-17 DIAGNOSIS — I5042 Chronic combined systolic (congestive) and diastolic (congestive) heart failure: Secondary | ICD-10-CM | POA: Diagnosis not present

## 2023-01-17 NOTE — Patient Instructions (Signed)
There has been no changes to your medications.  Your physician has requested that you have an echocardiogram. Echocardiography is a painless test that uses sound waves to create images of your heart. It provides your doctor with information about the size and shape of your heart and how well your heart's chambers and valves are working. This procedure takes approximately one hour. There are no restrictions for this procedure. Please do NOT wear cologne, perfume, aftershave, or lotions (deodorant is allowed). Please arrive 15 minutes prior to your appointment time.  Your physician recommends that you schedule a follow-up appointment in: 3 months with an echocardiogram ( October) ** PLEASE CALL THE OFFICE IN MID AUGUST TO ARRANGE YOUR FOLLOW UP APPOINTMENT. **  If you have any questions or concerns before your next appointment please send [place] a message through [place] or call our office at [phone].    TO LEAVE A MESSAGE FOR THE NURSE SELECT OPTION 2, PLEASE LEAVE A MESSAGE INCLUDING: YOUR NAME DATE OF BIRTH CALL BACK NUMBER REASON FOR CALL**this is important as we prioritize the call backs  YOU WILL RECEIVE A CALL BACK THE SAME DAY AS LONG AS YOU CALL BEFORE 4:00 PM  At the [facility], you and your health needs are our priority. As part of our continuing mission to provide you with exceptional heart care, we have created designated Provider Care Teams. These Care Teams include your primary Cardiologist (physician) and Advanced Practice Providers (APPs- Physician Assistants and Nurse Practitioners) who all work together to provide you with the care you need, when you need it.   You may see any of the following providers on your designated Care Team at your next follow up: Dr Arvilla Meres Dr Marca Ancona Dr. Marcos Eke, NP Robbie Lis, [place] [facility] [place], [place] Brynda Peon, NP Karle Plumber, PharmD   Please be sure to bring in  all your medications bottles to every appointment.    Thank you for choosing [facility]

## 2023-01-17 NOTE — Progress Notes (Signed)
PCP: Olene Floss, [facility] Primary Cardiologist: Dr Sharyn Lull HF MD: Dr Gala Romney   HPI: Lynn Estrada is a 62 y.o.with a history of HTN, GERD, nephrolithiasis, partial right nephrectomy, LBBB, and combined diastolic/systolic HF.      Admitted 1/23 with acute HFrEF. 07/13/21 Echo 20-25%, RV normal, grade II DD , and septal - lateral dyssnchrony. R/L cath w/ Minimal CAD, mildly elevated filling pressures and normal CO. cMRI ? Myocarditis (see impression below). LVEF 18%. Placed on Jardiance and spiro. Not on bb with acute decompensation. No ARNi with hypotension. Discharged on 07/20/21.   Echo 10/25/21 EF 30-35%  RHC 11/23 RA = 6 RV = 30/10  PA = 35/21 (29) PCW = 20  Fick cardiac output/index = 4.8/2.3 Thermo CO/CI = 5.2/2.4 PVR = 1.9 WU FA sat = 96% PA sat = 62%  CPX 12/23 - submax test. Non diagnostic.  Time 2:45 FVC 2.14 (58% predicted)      FEV1 1.82 (64% predicted)        FEV1/FVC 85%        MVV 65 (59% predicted)  BP rest: 100/74 BP standing: 94/72 BP peak: 92/70  Peak VO2: 8.4 (40.9% predicted peak VO2)  VE/VCO2 slope:  33 Peak RER: 0.85   Underwent Abbott CRT-D on 01/14/23.  Today she returns for HF follow up. Doesn;t notice any change with CRT. Able to do all ADLs without problem as long as she takes her time. No CP, edema, orthopnea or PND. Compliant with all meds. BP running low. No dizziness.   ROS: All systems negative except as listed in HPI, PMH and Problem List.  SH:  Social History   Socioeconomic History   Marital status: Married    Spouse name: Lynn Estrada   Number of children: 1   Years of education: college   Highest education level: Not on file  Occupational History   Occupation: Product manager  Tobacco Use   Smoking status: Former    Current packs/day: 0.00    Average packs/day: 0.8 packs/day for 5.0 years (3.8 ttl pk-yrs)    Types: Cigarettes    Start date: 01/11/1992    Quit date: 01/10/1997    Years since quitting: 26.0    Smokeless tobacco: Never  Vaping Use   Vaping status: Never Used  Substance and Sexual Activity   Alcohol use: Not Currently    Comment: social use; once a month   Drug use: No   Sexual activity: Yes    Partners: Male    Birth control/protection: Surgical  Other Topics Concern   Not on file  Social History Narrative   Lives with her husband and their daughter and their dog.   Social Determinants of Health   Financial Resource Strain: Low Risk  (08/20/2022)   Received [facility]   Overall Financial Resource Strain (CARDIA)    Difficulty of Paying Living Expenses: Not very hard  Food Insecurity: No Food Insecurity (08/20/2022)   Received from [facility]   Hunger Vital Sign    Worried About Running Out of Food in the Last Year: Never true    Ran Out of Food in the Last Year: Never true  Transportation Needs: No Transportation Needs (08/20/2022)   Received from [facility] - Transportation    Lack of Transportation (Medical): No    Lack of Transportation (Non-Medical): No  Physical Activity: Insufficiently Active (08/20/2022)   Received from [facility]   Exercise Vital Sign  Days of Exercise per Week: 3 days    Minutes of Exercise per Session: 30 min  Stress: No Stress Concern Present (08/20/2022)   Received from [facility] - Occupational Stress Questionnaire    Feeling of Stress : Only a [place]  Social Connections: Socially Integrated (08/20/2022)   Received from [facility]   Social Network    How would you rate your social network (family, work, friends)?: Good participation with social networks  Intimate Partner Violence: Not At Risk (08/20/2022)   Received from [facility]   HITS    Over the last 12 months how often did your partner physically hurt you?: 1    Over the last 12 months how often did your partner insult you or talk down to you?: 1    Over the last 12 months how often did your  partner threaten you with physical harm?: 1    Over the last 12 months how often did your partner scream or curse at you?: 1   FH:  Family History  Problem Relation Age of Onset   Sarcoidosis Mother    Prostate cancer Father    Hypertension Sister    Eczema Sister    Lupus Sister    Breast cancer Maternal Aunt    Gout Maternal Grandmother    Diabetes Maternal Grandmother        leg amputations   Stomach cancer Neg Hx    Colon cancer Neg Hx    Esophageal cancer Neg Hx    Rectal cancer Neg Hx    Past Medical History:  Diagnosis Date   Anemia    Arthritis    CHF (congestive heart failure) (HCC) 12/06/2022   EF is 20% to 25 %   COPD (chronic obstructive pulmonary disease) (HCC)    Diabetes mellitus without complication (HCC)    Eczema    GERD (gastroesophageal reflux disease)    History of adenomatous polyp of colon    08-03-2016  tubular adenoma x2 and hyperplastic   History of chronic gastritis 08/03/2016   History of ectopic pregnancy 1988   s/p  left salpingectomy   History of endometriosis    History of kidney stones    History of partial nephrectomy    right pelvis for very large stone   History of pneumonia 07/02/2016   CAP   History of sepsis    07-01-2016  sepsis w/ pyelonephritis, CAP /   12-31-2016  urosepsis w/ kidney stone obstruction   History of uterine leiomyoma    Hyperlipidemia    Hypertension    Left ureteral stone    Myocardial infarction [facility])    Nephrolithiasis    left obstructive stone and right non-obstructive stone  per CT 12-31-2016   Urgency of urination    Current Outpatient Medications  Medication Sig Dispense Refill   acetaminophen (TYLENOL) 500 MG tablet Take 500-1,000 mg by mouth every 6 (six) hours as needed (for pain.).     aspirin 81 MG EC tablet Take 81 mg by mouth daily with lunch.     atorvastatin (LIPITOR) 10 MG tablet Take 10 mg by mouth daily.     digoxin (LANOXIN) 0.125 MG tablet Take 1 tablet (125 mcg total) by mouth  daily. 90 tablet 3   empagliflozin (JARDIANCE) 10 MG TABS tablet Take 1 tablet (10 mg total) by mouth daily. 30 tablet 2   furosemide (LASIX) 40 MG tablet Take 40 mg by mouth daily.  Multiple Vitamin (MULTIVITAMIN WITH MINERALS) TABS tablet Take 1 tablet by mouth daily with lunch. One A Day for Women 50+     sacubitril-valsartan (ENTRESTO) 24-26 MG Take 1 tablet by mouth 2 (two) times daily. 60 tablet 11   spironolactone (ALDACTONE) 25 MG tablet TAKE 1 TABLET (25 MG TOTAL) BY MOUTH DAILY. 90 tablet 2   No current facility-administered medications for this encounter.   BP 108/70   Pulse 91   Wt 94.5 kg (208 lb 6.4 oz)   SpO2 97%   BMI 26.76 kg/m   Wt Readings from Last 3 Encounters:  01/17/23 94.5 kg (208 lb 6.4 oz)  01/14/23 93 kg (205 lb)  12/21/22 93.4 kg (205 lb 12.8 oz)   PHYSICAL EXAM: General:  Well appearing. No resp difficulty HEENT: normal Neck: supple. no JVD. Carotids 2+ bilat; no bruits. No lymphadenopathy or thryomegaly appreciated. Cor: PMI nondisplaced. Regular rate & rhythm. No rubs, gallops or murmurs. ICD site ok LUE in sling  Lungs: clear Abdomen: soft, nontender, nondistended. No hepatosplenomegaly. No bruits or masses. Good bowel sounds. Extremities: no cyanosis, clubbing, rash, edema Neuro: alert & orientedx3, cranial nerves grossly intact. moves all 4 extremities w/o difficulty. Affect pleasant  ASSESSMENT & PLAN:  Chronic Combined Systolic/Diastolic HF, NICM.  - 07/13/21 Echo 20-25%, RV normal, grade II DD , and septal -lateral dyssnchrony - 07/2021 cMRI possible myocarditis  LVEF 18% Suggestive of LBBB, RVEF 33% - [facility] 07/2021 min CAD. RA 6 PCWP 21, CO 6.8/ CI 3.1 - Echo(4/23): EF 30-35% -> EF improving but still with significant LV dysfunction (suspect resolving myocarditis) - Echo 10/23 EF < 20% RV ok  -RHC 11/23 w/ Mildly elevated filling pressures with mildly decreased CO - CPX 12/23 - submax test. Non diagnostic.  - Echo 12/06/22 EF 20-25%  RV  normal.  - Echo 01/14/23 EF < 20% RV ok  - LBBB, QRS  176 Lynn. -> now s/p Abbott CRT-D 7/24 -> QRS  - NYHA IIIB . Volume status stable.  - Continue digoxin 0.125 mg daily.  - Continue spironolactone 25 mg daily  - Continue entresto 24-26 mg twice a day,  - Continue Jardiance 10 mg daily.  - No bb with fatigue. - BP too low to titrate GDMT currently - QRS has shortened dramatically with CRT-D. Hopefully will lead to EF improvement - Plan repeat echo in 3 months   2. HTN - BP soft but stable  3. LBBB - LBBB, QRS 176 Lynn - now s/p Abbott CRT-D 7/24 -> QRS  - QRS has shortened dramatically with CRT-D. Hopefully will lead to EF improvement  4. Partial R Nephrectomy - recent labs reviewed and stable  5. Snoring/daytime fatigue - Deferred sleep study    Arvilla Meres MD 11:18 PM

## 2023-01-18 ENCOUNTER — Telehealth: Payer: Self-pay

## 2023-01-18 NOTE — Telephone Encounter (Signed)
-----   Message from Sheilah Pigeon sent at 01/18/2023  1:39 PM EDT ----- Sorry this is late  Same day d/c MONDAY 6/15 Abbott ICD [facility] No a/c  renee

## 2023-01-18 NOTE — Telephone Encounter (Signed)
Follow-up after same day discharge: Implant date: 01/14/2023 MD: York Pellant, MD Device: Abbott BiV ICD Location: left chest    Wound check visit: 01/30/2023  320pm  90 day MD follow-up: 04/16/2023   1145 am   Remote Transmission received: Received on 01/16/2023. Ongoing remote appts have been established.  Will forward to CMA team to ensure patient has been adequately setup in [facility] on Monday 7/22 as system is down today.   Dressing/sling removed: yes  Confirm OAC restart on: Patient is not on an OAC.  Has gone back on all meds following surgery.

## 2023-01-21 NOTE — Telephone Encounter (Signed)
Pt in paceart.  No action needed.

## 2023-01-30 ENCOUNTER — Ambulatory Visit: Payer: Medicaid Other | Attending: Internal Medicine

## 2023-01-30 DIAGNOSIS — I5041 Acute combined systolic (congestive) and diastolic (congestive) heart failure: Secondary | ICD-10-CM

## 2023-01-30 LAB — CUP PACEART INCLINIC DEVICE CHECK
Battery Remaining Longevity: 57 mo
Brady Statistic RA Percent Paced: 2.2 %
Brady Statistic RV Percent Paced: 99.39 %
Date Time Interrogation Session: 20240731155100
HighPow Impedance: 58.5 Ohm
Implantable Lead Connection Status: 753985
Implantable Lead Connection Status: 753985
Implantable Lead Connection Status: 753985
Implantable Lead Implant Date: 20240715
Implantable Lead Implant Date: 20240715
Implantable Lead Implant Date: 20240715
Implantable Lead Location: 753858
Implantable Lead Location: 753859
Implantable Lead Location: 753860
Implantable Lead Model: 7122
Implantable Pulse Generator Implant Date: 20240715
Lead Channel Impedance Value: 387.5 Ohm
Lead Channel Impedance Value: 425 Ohm
Lead Channel Impedance Value: 750 Ohm
Lead Channel Pacing Threshold Amplitude: 0.5 V
Lead Channel Pacing Threshold Amplitude: 0.5 V
Lead Channel Pacing Threshold Amplitude: 0.75 V
Lead Channel Pacing Threshold Amplitude: 0.75 V
Lead Channel Pacing Threshold Amplitude: 1.5 V
Lead Channel Pacing Threshold Amplitude: 1.5 V
Lead Channel Pacing Threshold Pulse Width: 0.5 ms
Lead Channel Pacing Threshold Pulse Width: 0.5 ms
Lead Channel Pacing Threshold Pulse Width: 0.5 ms
Lead Channel Pacing Threshold Pulse Width: 0.5 ms
Lead Channel Pacing Threshold Pulse Width: 0.5 ms
Lead Channel Pacing Threshold Pulse Width: 0.5 ms
Lead Channel Sensing Intrinsic Amplitude: 12 mV
Lead Channel Sensing Intrinsic Amplitude: 3.1 mV
Lead Channel Setting Pacing Amplitude: 3.5 V
Lead Channel Setting Pacing Amplitude: 3.5 V
Lead Channel Setting Pacing Amplitude: 3.5 V
Lead Channel Setting Pacing Pulse Width: 0.5 ms
Lead Channel Setting Pacing Pulse Width: 0.5 ms
Lead Channel Setting Sensing Sensitivity: 0.5 mV
Pulse Gen Serial Number: 211015851
Zone Setting Status: 755011

## 2023-01-30 NOTE — Progress Notes (Signed)

## 2023-01-30 NOTE — Patient Instructions (Signed)

## 2023-03-07 ENCOUNTER — Ambulatory Visit (INDEPENDENT_AMBULATORY_CARE_PROVIDER_SITE_OTHER): Payer: Medicaid Other | Admitting: Internal Medicine

## 2023-03-07 ENCOUNTER — Encounter: Payer: Self-pay | Admitting: Internal Medicine

## 2023-03-07 VITALS — BP 100/50 | HR 98 | Ht 74.0 in | Wt 208.0 lb

## 2023-03-07 DIAGNOSIS — Z95810 Presence of automatic (implantable) cardiac defibrillator: Secondary | ICD-10-CM | POA: Diagnosis not present

## 2023-03-07 DIAGNOSIS — Z86010 Personal history of colonic polyps: Secondary | ICD-10-CM

## 2023-03-07 DIAGNOSIS — I5042 Chronic combined systolic (congestive) and diastolic (congestive) heart failure: Secondary | ICD-10-CM | POA: Diagnosis not present

## 2023-03-07 MED ORDER — NA SULFATE-K SULFATE-MG SULF 17.5-3.13-1.6 GM/177ML PO SOLN: 1.0000 | Freq: Once | ORAL | 0 refills | Status: AC

## 2023-03-07 NOTE — Patient Instructions (Signed)
You have been scheduled for a colonoscopy. Please follow written instructions given to you at your visit today.   Please pick up your prep supplies at the pharmacy within the next 1-3 days.  If you use inhalers (even only as needed), please bring them with you on the day of your procedure.  DO NOT TAKE 7 DAYS PRIOR TO TEST- Trulicity (dulaglutide) Ozempic, Wegovy (semaglutide) Mounjaro (tirzepatide) Bydureon Bcise (exanatide extended release)  DO NOT TAKE 1 DAY PRIOR TO YOUR TEST Rybelsus (semaglutide) Adlyxin (lixisenatide) Victoza (liraglutide) Byetta (exanatide) ___________________________________________________________________________  I appreciate the opportunity to care for you. Carl Gessner, MD, FACG 

## 2023-03-07 NOTE — Progress Notes (Signed)
Lynn Estrada 62 y.o. July 27, 1960 [id number]  Assessment & Plan:   Encounter Diagnoses  Name Primary?   Personal history of colonic polyps Yes   Chronic combined systolic and diastolic heart failure (HCC)    AICD (automatic cardioverter/defibrillator) present     Colonoscopy is scheduled for September 24 at the hospital.  I reviewed the risks benefits and indications.  Suprep instructions provided.  Procedure needs to be at the hospital because of increased risk of sedation complications given her cardiomyopathy.   Subjective:   Chief Complaint: History of colon polyps  HPI 62 year old black woman with a history of adenomatous colon polyps in 2018 (Dr. Christella Estrada).  4 polyps removed 2 were adenomas 1 was hyperplastic and another 1 was lost.  He recommended a 5-year follow-up.   Other problems include severe cardiomyopathy, hypertension and prior nephrectomy.  She is not having any lower GI complaints at this time.  She recently had an AICD placed that is a bit sore in the left upper chest but it is healing.  She has some dyspnea on exertion with climbing steps.  She sleeps on some pillows because of some orthopnea.  She is followed closely in the CHF clinic. Allergies  Allergen Reactions   Latex Rash and Hives   Bactrim [Sulfamethoxazole-Trimethoprim] Rash   Carvedilol Itching   Metoprolol Itching and Rash   Current Meds  Medication Sig   acetaminophen (TYLENOL) 500 MG tablet Take 500-1,000 mg by mouth every 6 (six) hours as needed (for pain.).   aspirin 81 MG EC tablet Take 81 mg by mouth daily with lunch.   atorvastatin (LIPITOR) 10 MG tablet Take 10 mg by mouth daily.   digoxin (LANOXIN) 0.125 MG tablet Take 1 tablet (125 mcg total) by mouth daily.   empagliflozin (JARDIANCE) 10 MG TABS tablet Take 1 tablet (10 mg total) by mouth daily.   furosemide (LASIX) 40 MG tablet Take 40 mg by mouth daily.   Multiple Vitamin (MULTIVITAMIN WITH MINERALS) TABS tablet Take 1 tablet  by mouth daily with lunch. One A Day for Women 50+   sacubitril-valsartan (ENTRESTO) 24-26 MG Take 1 tablet by mouth 2 (two) times daily.   spironolactone (ALDACTONE) 25 MG tablet TAKE 1 TABLET (25 MG TOTAL) BY MOUTH DAILY.   Past Medical History:  Diagnosis Date   Anemia    Arthritis    CHF (congestive heart failure) (HCC) 12/06/2022   EF is 20% to 25 %   COPD (chronic obstructive pulmonary disease) (HCC)    Diabetes mellitus without complication (HCC)    Eczema    GERD (gastroesophageal reflux disease)    History of adenomatous polyp of colon    08-03-2016  tubular adenoma x2 and hyperplastic   History of chronic gastritis 08/03/2016   History of ectopic pregnancy 1988   s/p  left salpingectomy   History of endometriosis    History of kidney stones    History of partial nephrectomy    right pelvis for very large stone   History of pneumonia 07/02/2016   CAP   History of sepsis    07-01-2016  sepsis w/ pyelonephritis, CAP /   12-31-2016  urosepsis w/ kidney stone obstruction   History of uterine leiomyoma    Hyperlipidemia    Hypertension    Left ureteral stone    Myocardial infarction [facility])    Nephrolithiasis    left obstructive stone and right non-obstructive stone  per CT 12-31-2016   Urgency of urination  Past Surgical History:  Procedure Laterality Date   ABDOMINAL HYSTERECTOMY  01/20/2007   w/ Lysis Adhesions/  Left salpingoophorectomy/  Right Salpingectomy   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   Procedure: BIV ICD INSERTION CRT-D;  Surgeon: Maurice Small, MD;  Location: [facility] INVASIVE CV LAB;  Service: Cardiovascular;  Laterality: N/A;   CHOLECYSTECTOMY  1994   and Right Partial Nephrectomy (pelvis for very large stone)   CYSTOSCOPY W/ URETERAL STENT PLACEMENT Left 12/31/2016   Procedure: CYSTOSCOPY WITH RETROGRADE PYELOGRAM/ LEFT URETERAL STENT PLACEMENT;  Surgeon: Sebastian Ache, MD;  Location: [facility] ORS;  Service: Urology;  Laterality: Left;   CYSTOSCOPY  WITH RETROGRADE PYELOGRAM, URETEROSCOPY AND STENT PLACEMENT Left 01/18/2017   Procedure: 1ST STAGE CYSTOSCOPY WITH RETROGRADE PYELOGRAM, URETEROSCOPY AND STENT REPLACEMENT;  Surgeon: Sebastian Ache, MD;  Location: [facility];  Service: Urology;  Laterality: Left;   CYSTOSCOPY WITH RETROGRADE PYELOGRAM, URETEROSCOPY AND STENT PLACEMENT Left 02/01/2017   Procedure: 2ND STAGE CYSTOSCOPY WITH RETROGRADE PYELOGRAM, URETEROSCOPY AND STENT REPLACEMENT;  Surgeon: Sebastian Ache, MD;  Location: [facility];  Service: Urology;  Laterality: Left;   ECTOPIC PREGNANCY SURGERY  1988   Left Salpingectomy   HOLMIUM LASER APPLICATION Left 01/18/2017   Procedure: HOLMIUM LASER APPLICATION;  Surgeon: Sebastian Ache, MD;  Location: [facility];  Service: Urology;  Laterality: Left;   HOLMIUM LASER APPLICATION Left 02/01/2017   Procedure: HOLMIUM LASER APPLICATION;  Surgeon: Sebastian Ache, MD;  Location: [facility];  Service: Urology;  Laterality: Left;   LUMBAR DISC SURGERY  01/1999   right L5 -- S1   RE-EXPLORATION LUMBAR/  LAMINECTOMY AND MICRODISKECTOMY  10/14/2001   right L5 -- S1   RIGHT HEART CATH N/A 05/23/2022   Procedure: RIGHT HEART CATH;  Surgeon: Dolores Patty, MD;  Location: [facility] INVASIVE CV LAB;  Service: Cardiovascular;  Laterality: N/A;   RIGHT/LEFT HEART CATH AND CORONARY ANGIOGRAPHY N/A 07/18/2021   Procedure: RIGHT/LEFT HEART CATH AND CORONARY ANGIOGRAPHY;  Surgeon: Dolores Patty, MD;  Location: [facility] INVASIVE CV LAB;  Service: Cardiovascular;  Laterality: N/A;   TUBAL LIGATION Bilateral 10/17/1999   PPTL   UMBILICAL HERNIA REPAIR  age 61   Social History   Social History Narrative   Lives with her husband and their daughter and their dog.   family history includes Breast cancer in her maternal aunt; Diabetes in her maternal grandmother; Eczema in her sister; Gout in her maternal grandmother; Hypertension in her sister; Lupus  in her sister; Prostate cancer in her father; Sarcoidosis in her mother.   Review of Systems As per HPI  Objective:   Physical Exam BP (!) 100/50   Pulse 98   Ht 6\' 2"  (1.88 m)   Wt 208 lb (94.3 kg)   BMI 26.71 kg/m  NAD Lungs cta Cor NL S1s2 no murmur Abd soft NT

## 2023-03-11 ENCOUNTER — Other Ambulatory Visit (HOSPITAL_COMMUNITY): Payer: Self-pay | Admitting: Internal Medicine

## 2023-03-18 ENCOUNTER — Other Ambulatory Visit (HOSPITAL_COMMUNITY): Payer: Self-pay

## 2023-03-18 MED ORDER — EMPAGLIFLOZIN 10 MG PO TABS: 10.0000 mg | ORAL_TABLET | Freq: Every day | ORAL | 11 refills | Status: DC

## 2023-03-19 ENCOUNTER — Encounter: Payer: Self-pay | Admitting: Cardiovascular Disease

## 2023-03-19 ENCOUNTER — Other Ambulatory Visit: Payer: Self-pay

## 2023-03-19 NOTE — Progress Notes (Addendum)
Anesthesia Review:  PCP:  DR Enrique Sack  Cardiologist :- Bensimhon  Chest x-ray : EKG : 01/14/23  Echo : 01/14/23  Stress test: 02/08/22  Cardiac Cath : 05/23/22  Activity level:  Sleep Study/ CPAP : none  Fasting Blood Sugar :      / Checks Blood Sugar -- times a day:   Blood Thinner/ Instructions /Last Dose: ASA / Instructions/ Last Dose :    81 ng aspirin- pt has preprocedure instructons    DM- type 2-  Jardiance last dose o 03/23/23    Device check BiV ICD -located left upper chest - Orders requested on 03/19/23  :Last Device Check- 01/02/23   Latex Allergy

## 2023-03-19 NOTE — Progress Notes (Signed)
PERIOPERATIVE PRESCRIPTION FOR IMPLANTED CARDIAC DEVICE PROGRAMMING  Patient Information: Name:  Lynn Estrada  DOB:  1960-07-26  MRN:  [id number]    Planned Procedure:   Colonoscopy with propofol  Surgeon:  Dr  Stan Head  Date of Procedure:  03/26/23  Cautery will be used.  Position during surgery:   Please send documentation back to:  [facility] (Fax # [phone])  Device Information:  Clinic EP Physician:  Dr. York Pellant   Device Type:  Defibrillator Manufacturer and Phone #:  [employer] Jude/Abbott: [phone] Pacemaker Dependent?:  No. Date of Last Device Check:  01/30/2023 Normal Device Function?:  Yes.    Electrophysiologist's Recommendations:  Have magnet available. Provide continuous ECG monitoring when magnet is used or reprogramming is to be performed.  Procedure may interfere with device function.  Magnet should be placed over device during procedure.  Per Device Clinic Standing Orders, Lenor Coffin, RN  12:32 PM 03/19/2023

## 2023-03-25 NOTE — Anesthesia Preprocedure Evaluation (Signed)
Anesthesia Evaluation  Patient identified by MRN, date of birth, ID band Patient awake    Reviewed: Allergy & Precautions, NPO status , Patient's Chart, lab work & pertinent test results  Airway Mallampati: II  TM Distance: >3 FB Neck ROM: Full    Dental no notable dental hx. (+) Teeth Intact   Pulmonary COPD, former smoker   Pulmonary exam normal breath sounds clear to auscultation       Cardiovascular hypertension, +CHF  Normal cardiovascular exam+ Cardiac Defibrillator  Rhythm:Regular Rate:Normal     Neuro/Psych    GI/Hepatic ,GERD  ,,  Endo/Other  diabetes    Renal/GU      Musculoskeletal  (+) Arthritis ,    Abdominal   Peds  Hematology   Anesthesia Other Findings All: latex, Bactrim, carvedilol, metoprolol  Reproductive/Obstetrics                             Anesthesia Physical Anesthesia Plan  ASA: 2  Anesthesia Plan: MAC   Post-op Pain Management:    Induction:   PONV Risk Score and Plan: Propofol infusion and Treatment may vary due to age or medical condition  Airway Management Planned:   Additional Equipment: None  Intra-op Plan:   Post-operative Plan:   Informed Consent: I have reviewed the patients History and Physical, chart, labs and discussed the procedure including the risks, benefits and alternatives for the proposed anesthesia with the patient or authorized representative who has indicated his/her understanding and acceptance.     Dental advisory given  Plan Discussed with:   Anesthesia Plan Comments: (Colonoscopy Colon Ca Screening )        Anesthesia Quick Evaluation

## 2023-03-26 ENCOUNTER — Encounter (HOSPITAL_COMMUNITY)
Admission: RE | Disposition: A | Discharge status: Home / Self Care | Payer: Self-pay | Source: Home / Self Care | Attending: Internal Medicine

## 2023-03-26 ENCOUNTER — Ambulatory Visit (HOSPITAL_COMMUNITY): Payer: Self-pay | Admitting: Anesthesiology

## 2023-03-26 ENCOUNTER — Ambulatory Visit (HOSPITAL_COMMUNITY)
Admission: RE | Admit: 2023-03-26 | Discharge: 2023-03-26 | Disposition: A | Discharge status: Home / Self Care | Payer: Medicaid Other | Attending: Internal Medicine | Admitting: Internal Medicine

## 2023-03-26 ENCOUNTER — Encounter (HOSPITAL_COMMUNITY): Payer: Self-pay | Admitting: Internal Medicine

## 2023-03-26 ENCOUNTER — Other Ambulatory Visit: Payer: Self-pay

## 2023-03-26 DIAGNOSIS — K635 Polyp of colon: Secondary | ICD-10-CM

## 2023-03-26 DIAGNOSIS — E119 Type 2 diabetes mellitus without complications: Secondary | ICD-10-CM | POA: Diagnosis not present

## 2023-03-26 DIAGNOSIS — Z86010 Personal history of colonic polyps: Secondary | ICD-10-CM | POA: Diagnosis not present

## 2023-03-26 DIAGNOSIS — I509 Heart failure, unspecified: Secondary | ICD-10-CM

## 2023-03-26 DIAGNOSIS — D124 Benign neoplasm of descending colon: Secondary | ICD-10-CM | POA: Diagnosis not present

## 2023-03-26 DIAGNOSIS — D125 Benign neoplasm of sigmoid colon: Secondary | ICD-10-CM

## 2023-03-26 DIAGNOSIS — J449 Chronic obstructive pulmonary disease, unspecified: Secondary | ICD-10-CM | POA: Diagnosis not present

## 2023-03-26 DIAGNOSIS — K219 Gastro-esophageal reflux disease without esophagitis: Secondary | ICD-10-CM | POA: Insufficient documentation

## 2023-03-26 DIAGNOSIS — K6289 Other specified diseases of anus and rectum: Secondary | ICD-10-CM | POA: Diagnosis not present

## 2023-03-26 DIAGNOSIS — I110 Hypertensive heart disease with heart failure: Secondary | ICD-10-CM | POA: Diagnosis not present

## 2023-03-26 DIAGNOSIS — M1990 Unspecified osteoarthritis, unspecified site: Secondary | ICD-10-CM | POA: Diagnosis not present

## 2023-03-26 DIAGNOSIS — Z87891 Personal history of nicotine dependence: Secondary | ICD-10-CM | POA: Diagnosis not present

## 2023-03-26 DIAGNOSIS — Z1211 Encounter for screening for malignant neoplasm of colon: Secondary | ICD-10-CM | POA: Insufficient documentation

## 2023-03-26 DIAGNOSIS — D120 Benign neoplasm of cecum: Secondary | ICD-10-CM

## 2023-03-26 HISTORY — PX: COLONOSCOPY WITH PROPOFOL: SHX5780

## 2023-03-26 HISTORY — PX: POLYPECTOMY: SHX5525

## 2023-03-26 LAB — GLUCOSE, CAPILLARY: Glucose-Capillary: 122 mg/dL — ABNORMAL HIGH (ref 70–99)

## 2023-03-26 SURGERY — COLONOSCOPY WITH PROPOFOL
Anesthesia: Monitor Anesthesia Care

## 2023-03-26 MED ORDER — PROPOFOL 500 MG/50ML IV EMUL
INTRAVENOUS | Status: AC
  Filled 2023-03-26: qty 50

## 2023-03-26 MED ORDER — LACTATED RINGERS IV SOLN
INTRAVENOUS | Status: AC | PRN
  Administered 2023-03-26: 1000 mL via INTRAVENOUS

## 2023-03-26 MED ORDER — PHENYLEPHRINE HCL (PRESSORS) 10 MG/ML IV SOLN
INTRAVENOUS | Status: DC | PRN
Start: 2023-03-26 — End: 2023-03-26
  Administered 2023-03-26: 40 ug via INTRAVENOUS
  Administered 2023-03-26 (×2): 120 ug via INTRAVENOUS

## 2023-03-26 MED ORDER — PROPOFOL 1000 MG/100ML IV EMUL
INTRAVENOUS | Status: AC
  Filled 2023-03-26: qty 100

## 2023-03-26 MED ORDER — PROPOFOL 500 MG/50ML IV EMUL
INTRAVENOUS | Status: DC | PRN
  Administered 2023-03-26: 40 mg via INTRAVENOUS
  Administered 2023-03-26: 20 mg via INTRAVENOUS
  Administered 2023-03-26: 95 ug/kg/min via INTRAVENOUS
  Administered 2023-03-26: 40 mg via INTRAVENOUS

## 2023-03-26 MED ORDER — LIDOCAINE HCL (PF) 2 % IJ SOLN
INTRAMUSCULAR | Status: DC | PRN
Start: 2023-03-26 — End: 2023-03-26
  Administered 2023-03-26: 60 mg via INTRADERMAL

## 2023-03-26 MED ORDER — SODIUM CHLORIDE 0.9 % IV SOLN: INTRAVENOUS | Status: DC

## 2023-03-26 SURGICAL SUPPLY — 22 items

## 2023-03-26 NOTE — H&P (Signed)
[facility] History and Physical   Primary Care Physician:  Porfirio Oar, PA   Reason for Procedure:   Hx adenomatous colon polyps  Plan:    colonoscopy     HPI: Lynn Estrada is a 62 y.o. female s/p removal of adenomatous polyps in 2018 - and some polyps were not retrieved (4 total). For surveillance exam. She has CHF and hospital setting is needed for sedation  Past Medical History:  Diagnosis Date   Anemia    Arthritis    CHF (congestive heart failure) (HCC) 12/06/2022   EF is 20% to 25 %   COPD (chronic obstructive pulmonary disease) (HCC)    Diabetes mellitus without complication (HCC)    Eczema    GERD (gastroesophageal reflux disease)    History of adenomatous polyp of colon    08-03-2016  tubular adenoma x2 and hyperplastic   History of chronic gastritis 08/03/2016   History of ectopic pregnancy 1988   s/p  left salpingectomy   History of endometriosis    History of kidney stones    History of partial nephrectomy    right pelvis for very large stone   History of pneumonia 07/02/2016   CAP   History of sepsis    07-01-2016  sepsis w/ pyelonephritis, CAP /   12-31-2016  urosepsis w/ kidney stone obstruction   History of uterine leiomyoma    Hyperlipidemia    Hypertension    Left ureteral stone    Myocardial infarction [facility])    Nephrolithiasis    left obstructive stone and right non-obstructive stone  per CT 12-31-2016   Urgency of urination     Past Surgical History:  Procedure Laterality Date   ABDOMINAL HYSTERECTOMY  01/20/2007   w/ Lysis Adhesions/  Left salpingoophorectomy/  Right Salpingectomy   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   Procedure: BIV ICD INSERTION CRT-D;  Surgeon: Maurice Small, MD;  Location: [facility] INVASIVE CV LAB;  Service: Cardiovascular;  Laterality: N/A;   CHOLECYSTECTOMY  1994   and Right Partial Nephrectomy (pelvis for very large stone)   CYSTOSCOPY W/ URETERAL STENT PLACEMENT Left 12/31/2016    Procedure: CYSTOSCOPY WITH RETROGRADE PYELOGRAM/ LEFT URETERAL STENT PLACEMENT;  Surgeon: Sebastian Ache, MD;  Location: [facility];  Service: Urology;  Laterality: Left;   CYSTOSCOPY WITH RETROGRADE PYELOGRAM, URETEROSCOPY AND STENT PLACEMENT Left 01/18/2017   Procedure: 1ST STAGE CYSTOSCOPY WITH RETROGRADE PYELOGRAM, URETEROSCOPY AND STENT REPLACEMENT;  Surgeon: Sebastian Ache, MD;  Location: [facility];  Service: Urology;  Laterality: Left;   CYSTOSCOPY WITH RETROGRADE PYELOGRAM, URETEROSCOPY AND STENT PLACEMENT Left 02/01/2017   Procedure: 2ND STAGE CYSTOSCOPY WITH RETROGRADE PYELOGRAM, URETEROSCOPY AND STENT REPLACEMENT;  Surgeon: Sebastian Ache, MD;  Location: [facility];  Service: Urology;  Laterality: Left;   ECTOPIC PREGNANCY SURGERY  1988   Left Salpingectomy   HOLMIUM LASER APPLICATION Left 01/18/2017   Procedure: HOLMIUM LASER APPLICATION;  Surgeon: Sebastian Ache, MD;  Location: [facility];  Service: Urology;  Laterality: Left;   HOLMIUM LASER APPLICATION Left 02/01/2017   Procedure: HOLMIUM LASER APPLICATION;  Surgeon: Sebastian Ache, MD;  Location: [facility];  Service: Urology;  Laterality: Left;   LUMBAR DISC SURGERY  01/1999   right L5 -- S1   RE-EXPLORATION LUMBAR/  LAMINECTOMY AND MICRODISKECTOMY  10/14/2001   right L5 -- S1   RIGHT HEART CATH N/A 05/23/2022   Procedure: RIGHT HEART CATH;  Surgeon: Dolores Patty, MD;  Location: [facility] INVASIVE  CV LAB;  Service: Cardiovascular;  Laterality: N/A;   RIGHT/LEFT HEART CATH AND CORONARY ANGIOGRAPHY N/A 07/18/2021   Procedure: RIGHT/LEFT HEART CATH AND CORONARY ANGIOGRAPHY;  Surgeon: Dolores Patty, MD;  Location: [facility]VASIVE CV LAB;  Service: Cardiovascular;  Laterality: N/A;   TUBAL LIGATION Bilateral 10/17/1999   PPTL   UMBILICAL HERNIA REPAIR  age 58    Prior to Admission medications   Medication Sig Start Date End Date Taking? Authorizing Provider  aspirin 81  MG EC tablet Take 81 mg by mouth daily with lunch.   Yes [provider]  atorvastatin (LIPITOR) 10 MG tablet Take 10 mg by mouth daily. 05/15/21  Yes [provider]  digoxin (LANOXIN) 0.125 MG tablet TAKE 1 TABLET BY MOUTH EVERY DAY 03/12/23  Yes Bensimhon, Bevelyn Buckles, MD  furosemide (LASIX) 40 MG tablet Take 40 mg by mouth daily.   Yes [provider]  Multiple Vitamin (MULTIVITAMIN WITH MINERALS) TABS tablet Take 1 tablet by mouth daily with lunch. One A Day for Women 50+   Yes [provider]  sacubitril-valsartan (ENTRESTO) 24-26 MG Take 1 tablet by mouth 2 (two) times daily. 05/21/22  Yes Bensimhon, Bevelyn Buckles, MD  spironolactone (ALDACTONE) 25 MG tablet TAKE 1 TABLET (25 MG TOTAL) BY MOUTH DAILY. 12/31/22  Yes Bensimhon, Bevelyn Buckles, MD  acetaminophen (TYLENOL) 500 MG tablet Take 500-1,000 mg by mouth every 6 (six) hours as needed (for pain.).    [provider]  empagliflozin (JARDIANCE) 10 MG TABS tablet Take 1 tablet (10 mg total) by mouth daily. 03/18/23   Bensimhon, Bevelyn Buckles, MD    Current Facility-Administered Medications  Medication Dose Route Frequency Provider Last Rate Last Admin   0.9 %  sodium chloride infusion   Intravenous Continuous Iva Boop, MD       lactated ringers infusion    Continuous PRN Iva Boop, MD 10 mL/hr at 03/26/23 0717 1,000 mL at 03/26/23 0717    Allergies as of 12/17/2022 - Review Complete 12/16/2022  Allergen Reaction Noted   Latex Rash and Hives 01/14/2017   Bactrim [sulfamethoxazole-trimethoprim] Rash 02/07/2014   Carvedilol Itching 03/01/2022   Metoprolol Itching and Rash 03/01/2022    Family History  Problem Relation Age of Onset   Sarcoidosis Mother    Prostate cancer Father    Hypertension Sister    Eczema Sister    Lupus Sister    Breast cancer Maternal Aunt    Gout Maternal Grandmother    Diabetes Maternal Grandmother        leg amputations   Stomach cancer Neg Hx    Colon cancer Neg  Hx    Esophageal cancer Neg Hx    Rectal cancer Neg Hx     Social History   Socioeconomic History   Marital status: Married    Spouse name: Richard Lewis   Number of children: 1   Years of education: college   Highest education level: Not on file  Occupational History   Occupation: Product manager  Tobacco Use   Smoking status: Former    Current packs/day: 0.00    Average packs/day: 0.8 packs/day for 5.0 years (3.8 ttl pk-yrs)    Types: Cigarettes    Start date: 01/11/1992    Quit date: 01/10/1997    Years since quitting: 26.2   Smokeless tobacco: Never  Vaping Use   Vaping status: Never Used  Substance and Sexual Activity   Alcohol use: Not Currently    Comment: social use;  once a month   Drug use: No   Sexual activity: Yes    Partners: Male    Birth control/protection: Surgical  Other Topics Concern   Not on file  Social History Narrative   Lives with her husband and their daughter and their dog.   Social Determinants of Health   Financial Resource Strain: Low Risk  (08/20/2022)   Received [facility], [facility]   Overall Financial Resource Strain (CARDIA)    Difficulty of Paying Living Expenses: Not very hard  Food Insecurity: No Food Insecurity (08/20/2022)   Received from [facility], [facility]   Hunger Vital Sign    Worried About Running Out of Food in the Last Year: Never true    Ran Out of Food in the Last Year: Never true  Transportation Needs: No Transportation Needs (08/20/2022)   Received from [facility], [facility]   PRAPARE - Transportation    Lack of Transportation (Medical): No    Lack of Transportation (Non-Medical): No  Physical Activity: Insufficiently Active (08/20/2022)   Received from [facility], [facility]   Exercise Vital Sign    Days of Exercise per Week: 3 days    Minutes of Exercise per Session: 30 min  Stress: No Stress Concern Present (08/20/2022)   Received from [facility], [facility] - Occupational Stress Questionnaire    Feeling of Stress : Only a Vallie  Social Connections: Socially Integrated (08/20/2022)   Received from [facility], [facility]   Social Network    How would you rate your social network (family, work, friends)?: Good participation with social networks  Intimate Partner Violence: Not At Risk (08/20/2022)   Received from [facility], [facility]   HITS    Over the last 12 months how often did your partner physically hurt you?: 1    Over the last 12 months how often did your partner insult you or talk down to you?: 1    Over the last 12 months how often did your partner threaten you with physical harm?: 1    Over the last 12 months how often did your partner scream or curse at you?: 1    Review of Systems:  All other review of systems negative except as mentioned in the HPI.  Physical Exam: Vital signs BP 110/74   Pulse 80   Temp (!) 97.4 F (36.3 C) (Temporal)   Resp 16   Ht 6\' 2"  (1.88 m)   Wt 93.4 kg   SpO2 98%   BMI 26.45 kg/m   General:   Alert,  Well-developed, well-nourished, pleasant and cooperative in NAD Lungs:  Clear throughout to auscultation.   Heart:  Regular rate and rhythm; no murmurs, clicks, rubs,  or gallops. Abdomen:  Soft, nontender and nondistended. Normal bowel sounds.   Neuro/Psych:  Alert and cooperative. Normal mood and affect. A and O x 3   @Tiffiany Beadles  Sena Slate, MD, [facility] [phone] (pager) 03/26/2023 7:17 AM@

## 2023-03-26 NOTE — Anesthesia Postprocedure Evaluation (Signed)
Anesthesia Post Note  Patient: Lynn Estrada  Procedure(s) Performed: COLONOSCOPY WITH PROPOFOL POLYPECTOMY     Patient location during evaluation: Endoscopy Anesthesia Type: MAC Level of consciousness: awake and alert Pain management: pain level controlled Vital Signs Assessment: post-procedure vital signs reviewed and stable Respiratory status: spontaneous breathing, nonlabored ventilation, respiratory function stable and patient connected to nasal cannula oxygen Cardiovascular status: blood pressure returned to baseline and stable Postop Assessment: no apparent nausea or vomiting Anesthetic complications: no   No notable events documented.  Last Vitals:  Vitals:   03/26/23 0800 03/26/23 0810  BP: (!) 100/57 (!) 90/55  Pulse: 73 61  Resp: (!) 23 10  Temp:    SpO2: 98% 98%    Last Pain:  Vitals:   03/26/23 0810  TempSrc:   PainSc: 0-No pain                 Trevor Iha

## 2023-03-26 NOTE — Op Note (Signed)
[facility] Patient Name: Lynn Estrada Procedure Date: 03/26/2023 MRN: [id number] Attending MD: Iva Boop , MD, [id number] Date of Birth: 11/14/1960 CSN: [id number] Age: 62 Admit Type: Outpatient Procedure:                Colonoscopy Indications:              Surveillance: Personal history of adenomatous                            polyps on last colonoscopy > 5 years ago, Last                            colonoscopy: February 2018 Providers:                Iva Boop, MD, Jacquelyn "Jaci" Clelia Croft, RN,                            Sunday Corn Mbumina, Technician Referring MD:              Medicines:                Monitored Anesthesia Care Complications:            No immediate complications. Estimated Blood Loss:     Estimated blood loss was minimal. Estimated blood                            loss was minimal. Procedure:                Pre-Anesthesia Assessment:                           - Prior to the procedure, a History and Physical                            was performed, and patient medications and                            allergies were reviewed. The patient's tolerance of                            previous anesthesia was also reviewed. The risks                            and benefits of the procedure and the sedation                            options and risks were discussed with the patient.                            All questions were answered, and informed consent                            was obtained. Prior Anticoagulants: The patient has  taken no anticoagulant or antiplatelet agents. ASA                            Grade Assessment: II - A patient with mild systemic                            disease. After reviewing the risks and benefits,                            the patient was deemed in satisfactory condition to                            undergo the procedure.                           After obtaining informed  consent, the colonoscope                            was passed under direct vision. Throughout the                            procedure, the patient's blood pressure, pulse, and                            oxygen saturations were monitored continuously. The                            CF-HQ190L ([id number]) Olympus colonoscope was                            introduced through the anus and advanced to the the                            cecum, identified by appendiceal orifice and                            ileocecal valve. The colonoscopy was performed                            without difficulty. The patient tolerated the                            procedure well. The quality of the bowel                            preparation was excellent. The ileocecal valve,                            appendiceal orifice, and rectum were photographed.                            The bowel preparation used was SUPREP via split  dose instruction. Scope In: 7:33:16 AM Scope Out: 7:48:04 AM Scope Withdrawal Time: 0 hours 11 minutes 16 seconds  Total Procedure Duration: 0 hours 14 minutes 48 seconds  Findings:      The perianal and digital rectal examinations were normal.      Four sessile polyps were found in the sigmoid colon, descending colon       and cecum. The polyps were diminutive in size. These polyps were removed       with a cold snare. Resection and retrieval were complete. Verification       of patient identification for the specimen was done. Estimated blood       loss was minimal.      Anal papilla(e) were hypertrophied.      The exam was otherwise without abnormality on direct and retroflexion       views. Impression:               - Four diminutive polyps in the sigmoid colon, in                            the descending colon and in the cecum, removed with                            a cold snare. Resected and retrieved.                           - Anal papilla(e)  were hypertrophied.                           - The examination was otherwise normal on direct                            and retroflexion views.                           - Personal history of colonic polyps. 08/2016 2                            adenomas 1 hpp and 1 polyp lost all < 1 cm Moderate Sedation:      Not Applicable - Patient had care per Anesthesia. Recommendation:           - Patient has a contact number available for                            emergencies. The signs and symptoms of potential                            delayed complications were discussed with the                            patient. Return to normal activities tomorrow.                            Written discharge instructions were provided to the  patient.                           - Resume previous diet.                           - Continue present medications.                           - Repeat colonoscopy is recommended. The                            colonoscopy date will be determined after pathology                            results from today's exam become available for                            review.                           - patient has CHF + AICD and EF < 35% so this                            procedure was performed in hospital setting Procedure Code(s):        --- Professional ---                           [phone], Colonoscopy, flexible; with removal of                            tumor(s), polyp(s), or other lesion(s) by snare                            technique Diagnosis Code(s):        --- Professional ---                           Z86.010, Personal history of colonic polyps                           D12.5, Benign neoplasm of sigmoid colon                           D12.4, Benign neoplasm of descending colon                           D12.0, Benign neoplasm of cecum                           K62.89, Other specified diseases of anus and rectum CPT copyright 2022  American Medical Association. All rights reserved. The codes documented in this report are preliminary and upon coder review may  be revised to meet current compliance requirements. Iva Boop, MD 03/26/2023 7:58:08 AM This report has been signed electronically. Number of Addenda: 0

## 2023-03-26 NOTE — Discharge Instructions (Addendum)

## 2023-03-26 NOTE — Transfer of Care (Signed)
Immediate Anesthesia Transfer of Care Note  Patient: Lynn Estrada  Procedure(s) Performed: COLONOSCOPY WITH PROPOFOL POLYPECTOMY  Patient Location: PACU  Anesthesia Type:MAC  Level of Consciousness: drowsy and patient cooperative  Airway & Oxygen Therapy: Patient Spontanous Breathing  Post-op Assessment: Report given to RN and Post -op Vital signs reviewed and stable  Post vital signs: Reviewed and stable  Last Vitals:  Vitals Value Taken Time  BP 100/56 03/26/23 0755  Temp    Pulse 70 03/26/23 0757  Resp 21 03/26/23 0757  SpO2 100 % 03/26/23 0757  Vitals shown include unfiled device data.  Last Pain:  Vitals:   03/26/23 0644  TempSrc: Temporal  PainSc: 3          Complications: No notable events documented.

## 2023-03-28 LAB — SURGICAL PATHOLOGY

## 2023-03-29 ENCOUNTER — Encounter (HOSPITAL_COMMUNITY): Payer: Self-pay | Admitting: Internal Medicine

## 2023-04-01 ENCOUNTER — Encounter: Payer: Self-pay | Admitting: Internal Medicine

## 2023-04-16 ENCOUNTER — Encounter: Payer: Medicaid Other | Admitting: Cardiovascular Disease

## 2023-04-16 ENCOUNTER — Ambulatory Visit (INDEPENDENT_AMBULATORY_CARE_PROVIDER_SITE_OTHER): Payer: Medicaid Other

## 2023-04-16 DIAGNOSIS — I447 Left bundle-branch block, unspecified: Secondary | ICD-10-CM

## 2023-04-17 LAB — CUP PACEART REMOTE DEVICE CHECK
Battery Remaining Longevity: 59 mo
Battery Remaining Percentage: 92 %
Battery Voltage: 3.01 V
Brady Statistic AP VP Percent: 1.1 %
Brady Statistic AP VS Percent: 1 %
Brady Statistic AS VP Percent: 99 %
Brady Statistic AS VS Percent: 1 %
Brady Statistic RA Percent Paced: 1 %
Date Time Interrogation Session: 20241015020151
HighPow Impedance: 61 Ohm
Implantable Lead Connection Status: 753985
Implantable Lead Connection Status: 753985
Implantable Lead Connection Status: 753985
Implantable Lead Implant Date: 20240715
Implantable Lead Implant Date: 20240715
Implantable Lead Implant Date: 20240715
Implantable Lead Location: 753858
Implantable Lead Location: 753859
Implantable Lead Location: 753860
Implantable Lead Model: 7122
Implantable Pulse Generator Implant Date: 20240715
Lead Channel Impedance Value: 1125 Ohm
Lead Channel Impedance Value: 490 Ohm
Lead Channel Impedance Value: 500 Ohm
Lead Channel Pacing Threshold Amplitude: 0.5 V
Lead Channel Pacing Threshold Amplitude: 0.75 V
Lead Channel Pacing Threshold Amplitude: 1.5 V
Lead Channel Pacing Threshold Pulse Width: 0.5 ms
Lead Channel Pacing Threshold Pulse Width: 0.5 ms
Lead Channel Pacing Threshold Pulse Width: 0.5 ms
Lead Channel Sensing Intrinsic Amplitude: 11.8 mV
Lead Channel Sensing Intrinsic Amplitude: 2.9 mV
Lead Channel Setting Pacing Amplitude: 3.5 V
Lead Channel Setting Pacing Amplitude: 3.5 V
Lead Channel Setting Pacing Amplitude: 3.5 V
Lead Channel Setting Pacing Pulse Width: 0.5 ms
Lead Channel Setting Pacing Pulse Width: 0.5 ms
Lead Channel Setting Sensing Sensitivity: 0.5 mV
Pulse Gen Serial Number: 211015851
Zone Setting Status: 755011

## 2023-04-22 DIAGNOSIS — R040 Epistaxis: Secondary | ICD-10-CM | POA: Insufficient documentation

## 2023-04-29 IMAGING — MR MR [PERSON_NAME]*[PERSON_NAME]* W/O CM
5 series · 40 of 40 positions shown · non-contrast
Comparison: None.

CLINICAL DATA: Mass on the palmar surface of the right hand which
is slowly increasing in size and occasionally painful.

EXAM:
MRI OF THE RIGHT HAND WITHOUT CONTRAST
TECHNIQUE: Multiplanar, multisequence MR imaging of the right hand was
performed. No intravenous contrast was administered.

[Series 4: T1 · axial · right · 4.0mm · 0.38mm/px · z∈[-79,+121]mm · 12 of 54 slices shown (1 of 2)]
[im 1/54]
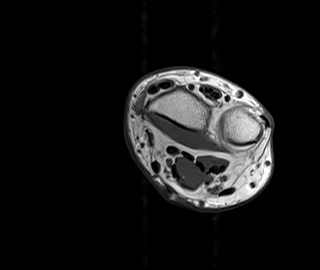
[im 5/54]
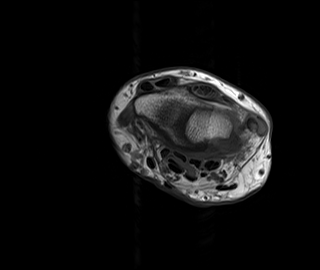
[im 10/54]
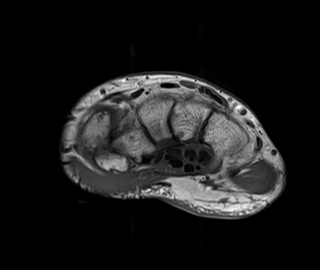
[im 15/54]
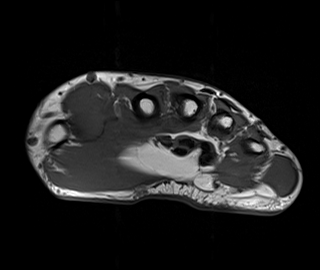
[im 20/54]
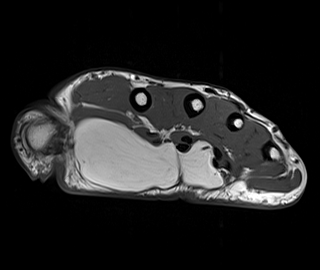
[im 25/54]
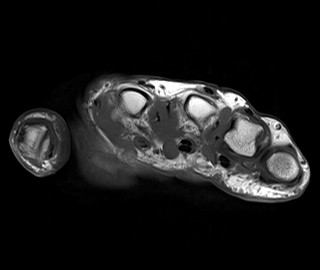
[im 29/54]
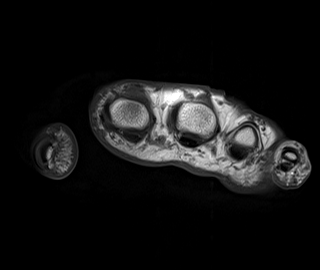
[im 34/54]
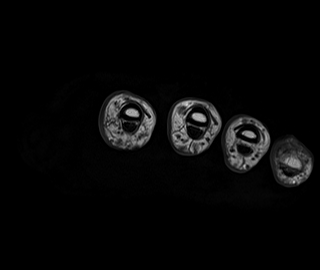
[im 39/54]
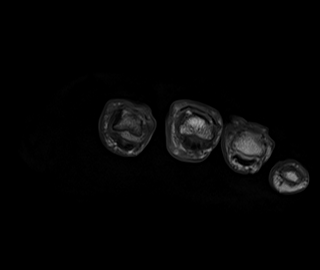
[im 44/54]
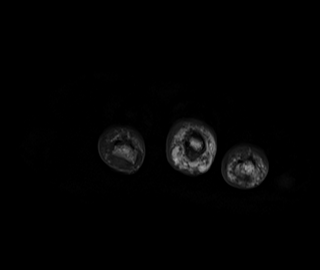
[im 49/54]
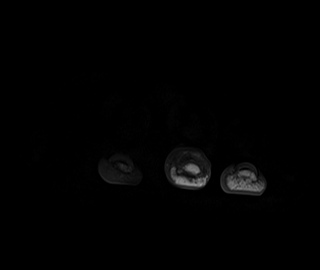
[im 54/54]
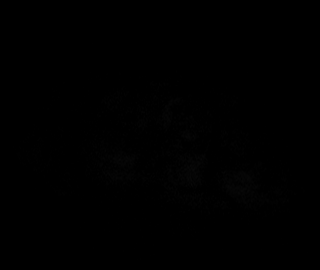

[Series 5: T2 fat-sat · axial · right · 4.0mm · 0.38mm/px · z∈[-79,+121]mm · 12 of 54 slices shown]
[im 1/54]
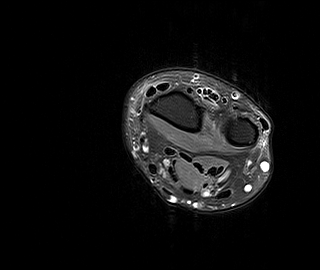
[im 5/54]
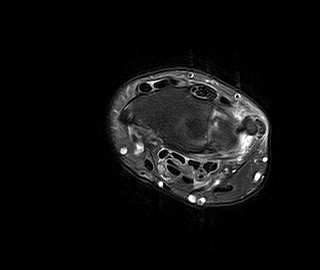
[im 10/54]
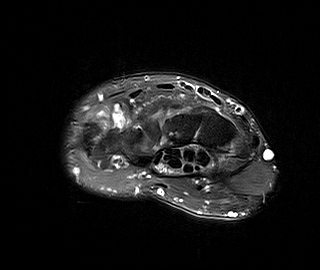
[im 15/54]
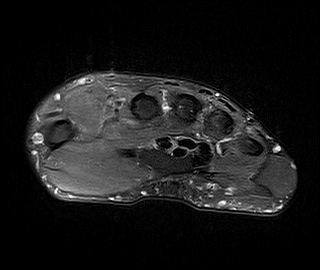
[im 20/54]
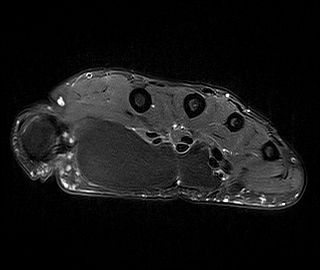
[im 25/54]
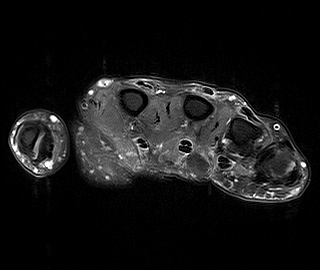
[im 29/54]
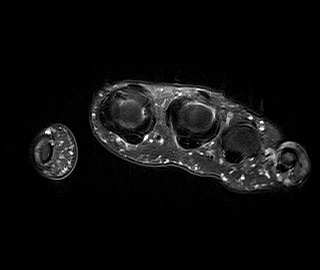
[im 34/54]
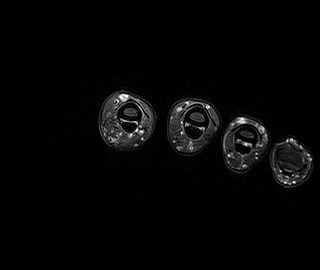
[im 39/54]
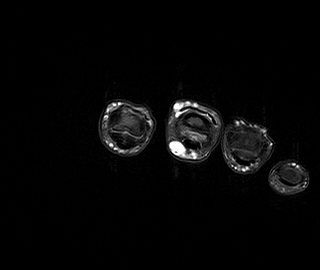
[im 44/54]
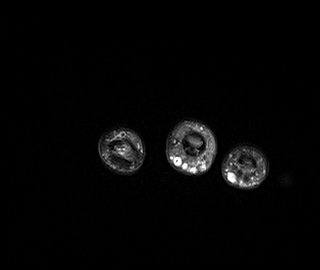
[im 49/54]
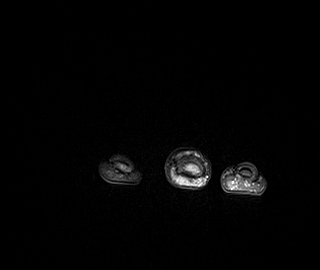
[im 54/54]
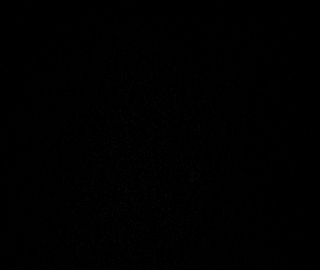

[Series 6: T1 · coronal · right · 3.0mm · 0.62mm/px · 4 of 19 slices shown (2 of 2)]
[im 1/19]
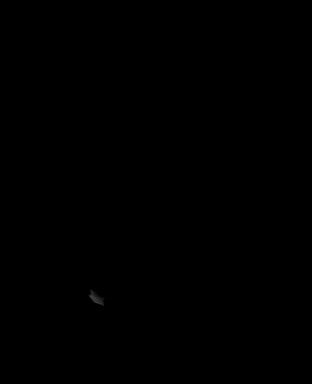
[im 7/19]
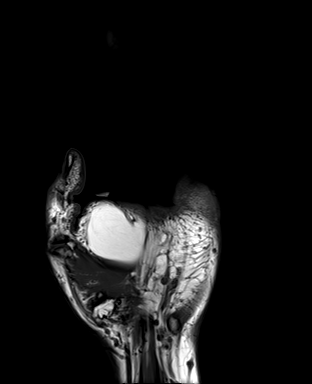
[im 13/19]
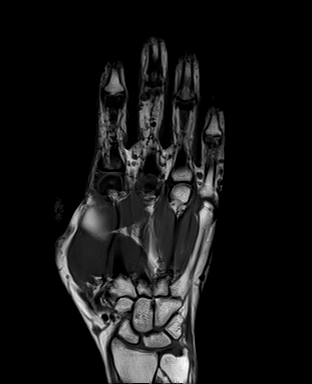
[im 19/19]
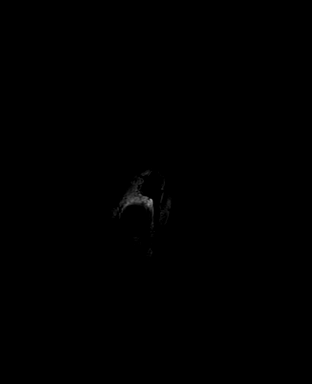

[Series 7: STIR · coronal · right · 3.0mm · 0.62mm/px · 4 of 19 slices shown]
[im 1/19]
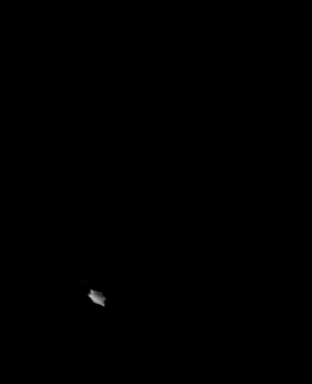
[im 7/19]
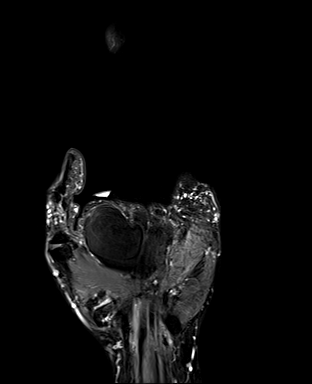
[im 13/19]
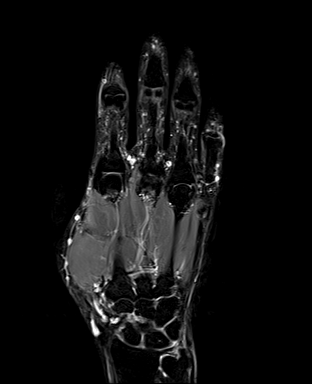
[im 19/19]
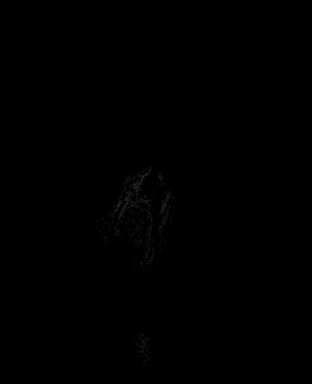

[Series 9: PD fat-sat · sagittal · right · 3.0mm · 0.75mm/px · 8 of 35 slices shown]
[im 1/35]
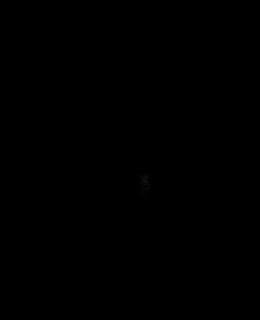
[im 5/35]
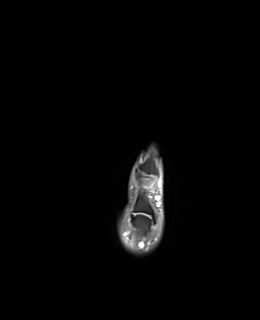
[im 10/35]
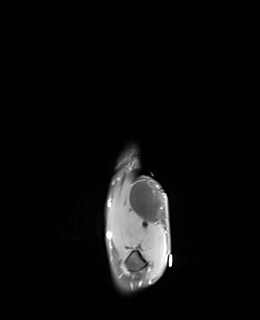
[im 15/35]
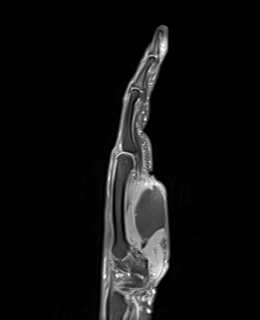
[im 20/35]
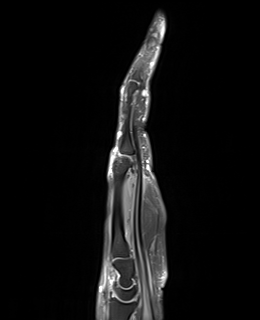
[im 25/35]
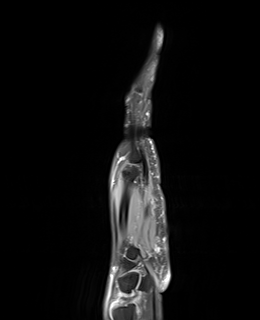
[im 30/35]
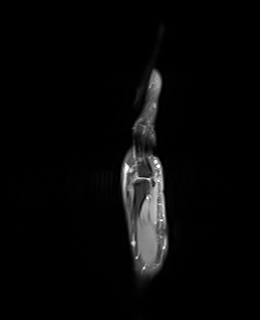
[im 35/35]
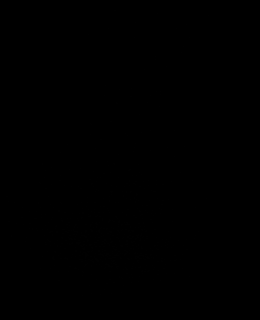

[40 of 40 positions shown; findings below may reference images not displayed]

FINDINGS: Bones/Joint/Cartilage

There is no acute bony or joint abnormality and no worrisome marrow
lesion is identified. The patient has advanced first CMC
osteoarthritis with bone-on-bone joint space narrowing, a small
joint effusion and extensive subchondral edema in both the trapezium
and base of the first metacarpal. Imaged joints otherwise appear
normal.

Ligaments

The dorsal band of the scapholunate ligament is degenerated but
appears intact. Also seen is marked thinning of the disc of the
triangular fibrocartilage. Disc is likely torn. Wide field-of-view
on this hand examination limits evaluation.

Muscles and Tendons

Intact. There is intrasubstance increased T2 signal in the extensor
carpi ulnaris at and just distal to the ulnar styloid consistent
with ECU tendinosis. The ECU subsheath appears torn.

Soft tissues

There is a mass measuring approximately 5.5 cm transverse by 2.2 cm
AP by 5.6 cm craniocaudal in the palmar soft tissues of the hand.
Discrete measurement is not possible. The lesion is encapsulated and
demonstrates uniform T1 hyperintense signal. There is complete
signal dropout on fat saturation sequences. The lesion is in the
superficial subcutaneous tissues and superficial to flexor tendons.
IMPRESSION: Mass in the palm are soft tissues of the hand is consistent with a
simple lipoma.

Severe first CMC osteoarthritis.

Tear of the disc of the triangular fibrocartilage.

Degenerated scapholunate ligament. No tear is seen but evaluation is
limited given wide field-of-view on this hand examination.

ECU tendinosis without tear.  The ECU subsheath appears torn.

## 2023-05-06 NOTE — Progress Notes (Signed)
Remote ICD transmission.   

## 2023-05-20 ENCOUNTER — Encounter: Payer: Self-pay | Admitting: Cardiovascular Disease

## 2023-05-20 ENCOUNTER — Ambulatory Visit: Payer: Medicaid Other | Attending: Cardiovascular Disease | Admitting: Cardiovascular Disease

## 2023-05-20 VITALS — BP 100/66 | HR 91 | Ht 73.0 in | Wt 213.0 lb

## 2023-05-20 DIAGNOSIS — I447 Left bundle-branch block, unspecified: Secondary | ICD-10-CM

## 2023-05-20 LAB — CUP PACEART INCLINIC DEVICE CHECK
Date Time Interrogation Session: 20241118164603
Implantable Lead Connection Status: 753985
Implantable Lead Connection Status: 753985
Implantable Lead Connection Status: 753985
Implantable Lead Implant Date: 20240715
Implantable Lead Implant Date: 20240715
Implantable Lead Implant Date: 20240715
Implantable Lead Location: 753858
Implantable Lead Location: 753859
Implantable Lead Location: 753860
Implantable Lead Model: 7122
Implantable Pulse Generator Implant Date: 20240715
Pulse Gen Serial Number: 211015851

## 2023-05-20 NOTE — Patient Instructions (Addendum)
Medication Instructions:  Your physician recommends that you continue on your current medications as directed. Please refer to the Current Medication list given to you today. *If you need a refill on your cardiac medications before your next appointment, please call your pharmacy*   Testing/Procedures: Echocardiogram - schedule to be completed in 3 months  Your physician has requested that you have an echocardiogram. Echocardiography is a painless test that uses sound waves to create images of your heart. It provides your doctor with information about the size and shape of your heart and how well your heart's chambers and valves are working. This procedure takes approximately one hour. There are no restrictions for this procedure. Please do NOT wear cologne, perfume, aftershave, or lotions (deodorant is allowed). Please arrive 15 minutes prior to your appointment time.  Please note: We ask at that you not bring children with you during ultrasound (echo/ vascular) testing. Due to room size and safety concerns, children are not allowed in the ultrasound rooms during exams. Our front office staff cannot provide observation of children in our lobby area while testing is being conducted. An adult accompanying a patient to their appointment will only be allowed in the ultrasound room at the discretion of the ultrasound technician under special circumstances. We apologize for any inconvenience.    Follow-Up: At [facility], you and your health needs are our priority.  As part of our continuing mission to provide you with exceptional heart care, we have created designated Provider Care Teams.  These Care Teams include your primary Cardiologist (physician) and Advanced Practice Providers (APPs -  Physician Assistants and Nurse Practitioners) who all work together to provide you with the care you need, when you need it.  We recommend signing up for the patient portal called "MyChart".  Sign up  information is provided on this After Visit Summary.  MyChart is used to connect with patients for Virtual Visits (Telemedicine).  Patients are able to view lab/test results, encounter notes, upcoming appointments, etc.  Non-urgent messages can be sent to your provider as well.   To learn more about what you can do with MyChart, go to [email or web address].    Your next appointment:   6 month(s)   Provider:   You will see one of the following Advanced Practice Providers on your designated Care Team:   Francis Dowse, Charlott Holler [address] [place], [place] Sherie Don, NP Canary Brim, NP

## 2023-05-20 NOTE — Progress Notes (Signed)
  Electrophysiology Office Note:    Date:  05/20/2023   ID:  Lynn Estrada, DOB March 27, 1961, MRN [id number]  PCP:  Porfirio Oar, PA   [facility] Providers Cardiologist:  None     Referring MD: Porfirio Oar, PA   History of Present Illness:    Lynn Estrada is a 62 y.o. female with a medical history significant for CHFrEF, LBBB, partial right nephrectomy, hypertension referred for placement of a CRT defibrillator.     She was admitted in January 2023 with acute heart failure with reduced ejection fraction, EF 20-25%.  Cath showed minimal coronary disease.  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  GDMT was started as tolerated and has been titrated up and outpatient follow-up.     Today, she reports that she is doing well. She feels that her energy levels improved significantly after placement of the device.  She is planning on joining a gym.  She does have some tenderness over the device site.  EKGs/Labs/Other Studies Reviewed Today:    Echocardiogram:  TTE 12/06/22 EF 20 to 25%  TTE 07/13/2021 EF 20 to 25%   Monitors:   Stress testing:   Advanced imaging:  07/19/2021 EF 18% with small mid myocardial basal lateral LGE.   Cardiac catherization  07/18/2021 Minimal non obstructive CAD   EKG:   EKG Interpretation Date/Time:  Monday May 20 2023 10:24:01 EST Ventricular Rate:  91 PR Interval:  154 QRS Duration:  136 QT Interval:  392 QTC Calculation: 482 R Axis:   -51  Text Interpretation: Atrial-sensed ventricular-paced rhythm When compared with ECG of 14-Jan-2023 13:15, Vent. rate has increased BY  12 BPM Confirmed by York Pellant [phone]) on 05/20/2023 11:07:10 AM     Physical Exam:    VS:  BP 100/66   Pulse 91   Ht 6\' 1"  (1.854 m)   Wt 213 lb (96.6 kg)   SpO2 95%   BMI 28.10 kg/m     Wt Readings from Last 3 Encounters:  05/20/23 213 lb (96.6 kg)  03/26/23 206 lb (93.4 kg)  03/07/23  208 lb (94.3 kg)     GEN:  Well nourished, well developed in no acute distress CARDIAC: RRR, no murmurs, rubs, gallops RESPIRATORY:  Normal work of breathing MUSCULOSKELETAL: no edema    ASSESSMENT & PLAN:    CHFrEF EF is persistently less than 25% despite greater than 90 days of GDMT NYHA III Continue sacubitril/valsartan 24 to 26 mg, furosemide 40 mg, digoxin 125 mcg Abbott CRT defibrillator in place I reviewed today's interrogation.  See Paceart for details LV threshold has been increasing over time. Continue to monitor  LBBB CRT device in place  Partial right nephrectomy Mild CKD; will minimize contrast use     Signed, Maurice Small, MD  05/20/2023 11:08 AM    [facility]

## 2023-05-24 ENCOUNTER — Encounter (HOSPITAL_COMMUNITY): Payer: Self-pay | Admitting: Internal Medicine

## 2023-05-24 ENCOUNTER — Other Ambulatory Visit: Payer: Self-pay

## 2023-05-24 ENCOUNTER — Telehealth: Payer: Self-pay | Admitting: Cardiovascular Disease

## 2023-05-24 ENCOUNTER — Observation Stay (HOSPITAL_COMMUNITY)
Admission: EM | Admit: 2023-05-24 | Discharge: 2023-05-27 | Disposition: A | Discharge status: Home / Self Care | Payer: Medicaid Other | Attending: Emergency Medicine | Admitting: Emergency Medicine

## 2023-05-24 ENCOUNTER — Encounter (HOSPITAL_COMMUNITY): Payer: Medicaid Other

## 2023-05-24 ENCOUNTER — Emergency Department (HOSPITAL_COMMUNITY): Payer: Medicaid Other

## 2023-05-24 ENCOUNTER — Observation Stay (HOSPITAL_COMMUNITY): Payer: Medicaid Other

## 2023-05-24 DIAGNOSIS — I2510 Atherosclerotic heart disease of native coronary artery without angina pectoris: Secondary | ICD-10-CM | POA: Diagnosis not present

## 2023-05-24 DIAGNOSIS — E119 Type 2 diabetes mellitus without complications: Secondary | ICD-10-CM | POA: Diagnosis not present

## 2023-05-24 DIAGNOSIS — R8281 Pyuria: Secondary | ICD-10-CM

## 2023-05-24 DIAGNOSIS — Z7984 Long term (current) use of oral hypoglycemic drugs: Secondary | ICD-10-CM | POA: Diagnosis not present

## 2023-05-24 DIAGNOSIS — M940 Chondrocostal junction syndrome [Tietze]: Secondary | ICD-10-CM | POA: Insufficient documentation

## 2023-05-24 DIAGNOSIS — Z79899 Other long term (current) drug therapy: Secondary | ICD-10-CM | POA: Diagnosis not present

## 2023-05-24 DIAGNOSIS — Z7982 Long term (current) use of aspirin: Secondary | ICD-10-CM | POA: Insufficient documentation

## 2023-05-24 DIAGNOSIS — I5020 Unspecified systolic (congestive) heart failure: Secondary | ICD-10-CM | POA: Insufficient documentation

## 2023-05-24 DIAGNOSIS — N390 Urinary tract infection, site not specified: Secondary | ICD-10-CM | POA: Diagnosis not present

## 2023-05-24 DIAGNOSIS — I639 Cerebral infarction, unspecified: Principal | ICD-10-CM

## 2023-05-24 DIAGNOSIS — Z87891 Personal history of nicotine dependence: Secondary | ICD-10-CM | POA: Insufficient documentation

## 2023-05-24 DIAGNOSIS — I110 Hypertensive heart disease with heart failure: Secondary | ICD-10-CM | POA: Insufficient documentation

## 2023-05-24 DIAGNOSIS — Z95810 Presence of automatic (implantable) cardiac defibrillator: Secondary | ICD-10-CM | POA: Diagnosis not present

## 2023-05-24 DIAGNOSIS — Z91040 Latex allergy status: Secondary | ICD-10-CM | POA: Diagnosis not present

## 2023-05-24 DIAGNOSIS — I951 Orthostatic hypotension: Secondary | ICD-10-CM | POA: Insufficient documentation

## 2023-05-24 DIAGNOSIS — I63521 Cerebral infarction due to unspecified occlusion or stenosis of right anterior cerebral artery: Secondary | ICD-10-CM | POA: Diagnosis not present

## 2023-05-24 DIAGNOSIS — R531 Weakness: Secondary | ICD-10-CM | POA: Diagnosis present

## 2023-05-24 LAB — CBC
HCT: 37.5 % (ref 36.0–46.0)
HCT: 37.1 % (ref 36.0–46.0)
Hemoglobin: 11.9 g/dL — ABNORMAL LOW (ref 12.0–15.0)
Hemoglobin: 12.1 g/dL (ref 12.0–15.0)
MCH: 28.7 pg (ref 26.0–34.0)
MCH: 28.9 pg (ref 26.0–34.0)
MCHC: 31.7 g/dL (ref 30.0–36.0)
MCHC: 32.6 g/dL (ref 30.0–36.0)
MCV: 90.4 fL (ref 80.0–100.0)
MCV: 88.8 fL (ref 80.0–100.0)
Platelets: 246 10*3/uL (ref 150–400)
Platelets: 253 10*3/uL (ref 150–400)
RBC: 4.15 MIL/uL (ref 3.87–5.11)
RBC: 4.18 MIL/uL (ref 3.87–5.11)
RDW: 13.6 % (ref 11.5–15.5)
RDW: 13.7 % (ref 11.5–15.5)
WBC: 6.3 10*3/uL (ref 4.0–10.5)
WBC: 5.6 10*3/uL (ref 4.0–10.5)
nRBC: 0 % (ref 0.0–0.2)
nRBC: 0 % (ref 0.0–0.2)

## 2023-05-24 LAB — COMPREHENSIVE METABOLIC PANEL
ALT: 20 U/L (ref 0–44)
ALT: 21 U/L (ref 0–44)
AST: 17 U/L (ref 15–41)
AST: 21 U/L (ref 15–41)
Albumin: 3.6 g/dL (ref 3.5–5.0)
Albumin: 3.7 g/dL (ref 3.5–5.0)
Alkaline Phosphatase: 53 U/L (ref 38–126)
Alkaline Phosphatase: 49 U/L (ref 38–126)
Anion gap: 9 (ref 5–15)
Anion gap: 10 (ref 5–15)
BUN: 16 mg/dL (ref 8–23)
BUN: 18 mg/dL (ref 8–23)
CO2: 26 mmol/L (ref 22–32)
CO2: 23 mmol/L (ref 22–32)
Calcium: 9.2 mg/dL (ref 8.9–10.3)
Calcium: 9.3 mg/dL (ref 8.9–10.3)
Chloride: 104 mmol/L (ref 98–111)
Chloride: 107 mmol/L (ref 98–111)
Creatinine, Ser: 1.11 mg/dL — ABNORMAL HIGH (ref 0.44–1.00)
Creatinine, Ser: 1.02 mg/dL — ABNORMAL HIGH (ref 0.44–1.00)
GFR, Estimated: 57 mL/min — ABNORMAL LOW (ref >60)
GFR, Estimated: >60 mL/min (ref >60)
Glucose, Bld: 125 mg/dL — ABNORMAL HIGH (ref 70–99)
Glucose, Bld: 146 mg/dL — ABNORMAL HIGH (ref 70–99)
Potassium: 4.3 mmol/L (ref 3.5–5.1)
Potassium: 4.3 mmol/L (ref 3.5–5.1)
Sodium: 139 mmol/L (ref 135–145)
Sodium: 140 mmol/L (ref 135–145)
Total Bilirubin: 0.5 mg/dL (ref <1.2)
Total Bilirubin: 0.3 mg/dL (ref <1.2)
Total Protein: 7.4 g/dL (ref 6.5–8.1)
Total Protein: 7.4 g/dL (ref 6.5–8.1)

## 2023-05-24 LAB — HEMOGLOBIN A1C
Hgb A1c MFr Bld: 7.1 % — ABNORMAL HIGH (ref 4.8–5.6)
Mean Plasma Glucose: 157.07 mg/dL

## 2023-05-24 LAB — LIPID PANEL
Cholesterol: 130 mg/dL (ref 0–200)
HDL: 27 mg/dL — ABNORMAL LOW (ref >40)
LDL Cholesterol: 60 mg/dL (ref 0–99)
Total CHOL/HDL Ratio: 4.8 {ratio}
Triglycerides: 217 mg/dL — ABNORMAL HIGH (ref <150)
VLDL: 43 mg/dL — ABNORMAL HIGH (ref 0–40)

## 2023-05-24 LAB — DIGOXIN LEVEL: Digoxin Level: 0.5 ng/mL — ABNORMAL LOW (ref 0.8–2.0)

## 2023-05-24 LAB — I-STAT CHEM 8, ED
BUN: 19 mg/dL (ref 8–23)
Calcium, Ion: 1.1 mmol/L — ABNORMAL LOW (ref 1.15–1.40)
Chloride: 105 mmol/L (ref 98–111)
Creatinine, Ser: 1.1 mg/dL — ABNORMAL HIGH (ref 0.44–1.00)
Glucose, Bld: 155 mg/dL — ABNORMAL HIGH (ref 70–99)
HCT: 36 % (ref 36.0–46.0)
Hemoglobin: 12.2 g/dL (ref 12.0–15.0)
Potassium: 4.1 mmol/L (ref 3.5–5.1)
Sodium: 140 mmol/L (ref 135–145)
TCO2: 24 mmol/L (ref 22–32)

## 2023-05-24 LAB — GLUCOSE, CAPILLARY
Glucose-Capillary: 133 mg/dL — ABNORMAL HIGH (ref 70–99)
Glucose-Capillary: 161 mg/dL — ABNORMAL HIGH (ref 70–99)

## 2023-05-24 LAB — CBG MONITORING, ED: Glucose-Capillary: 141 mg/dL — ABNORMAL HIGH (ref 70–99)

## 2023-05-24 MED ORDER — FUROSEMIDE 40 MG PO TABS
40.0000 mg | ORAL_TABLET | Freq: Every day | ORAL | Status: DC
  Filled 2023-05-24: qty 1

## 2023-05-24 MED ORDER — CLOPIDOGREL BISULFATE 75 MG PO TABS
75.0000 mg | ORAL_TABLET | Freq: Every day | ORAL | Status: DC
  Administered 2023-05-25 – 2023-05-27 (×3): 75 mg via ORAL
  Filled 2023-05-24 (×3): qty 1

## 2023-05-24 MED ORDER — CLOPIDOGREL BISULFATE 75 MG PO TABS
75.0000 mg | ORAL_TABLET | Freq: Once | ORAL | Status: AC
  Administered 2023-05-24: 75 mg via ORAL
  Filled 2023-05-24: qty 1

## 2023-05-24 MED ORDER — STROKE: EARLY STAGES OF RECOVERY BOOK
Freq: Once | Status: AC
Start: 2023-05-25 — End: 2023-05-25
  Filled 2023-05-24: qty 1

## 2023-05-24 MED ORDER — SENNOSIDES-DOCUSATE SODIUM 8.6-50 MG PO TABS: 1.0000 | ORAL_TABLET | Freq: Every evening | ORAL | Status: DC | PRN

## 2023-05-24 MED ORDER — ACETAMINOPHEN 160 MG/5ML PO SOLN: 650.0000 mg | ORAL | Status: DC | PRN

## 2023-05-24 MED ORDER — ASPIRIN 81 MG PO TBEC
81.0000 mg | DELAYED_RELEASE_TABLET | Freq: Every day | ORAL | Status: DC
  Administered 2023-05-25 – 2023-05-26 (×2): 81 mg via ORAL
  Filled 2023-05-24 (×2): qty 1

## 2023-05-24 MED ORDER — ACETAMINOPHEN 325 MG PO TABS
650.0000 mg | ORAL_TABLET | ORAL | Status: DC | PRN
  Administered 2023-05-25 – 2023-05-26 (×2): 650 mg via ORAL
  Filled 2023-05-24 (×2): qty 2

## 2023-05-24 MED ORDER — EMPAGLIFLOZIN 10 MG PO TABS
10.0000 mg | ORAL_TABLET | Freq: Every day | ORAL | Status: DC
  Administered 2023-05-25: 10 mg via ORAL
  Filled 2023-05-24: qty 1

## 2023-05-24 MED ORDER — ATORVASTATIN CALCIUM 10 MG PO TABS
10.0000 mg | ORAL_TABLET | Freq: Every day | ORAL | Status: DC
  Administered 2023-05-24 – 2023-05-27 (×4): 10 mg via ORAL
  Filled 2023-05-24 (×4): qty 1

## 2023-05-24 MED ORDER — ACETAMINOPHEN 650 MG RE SUPP: 650.0000 mg | RECTAL | Status: DC | PRN

## 2023-05-24 MED ORDER — INSULIN ASPART 100 UNIT/ML IJ SOLN
0.0000 [IU] | Freq: Three times a day (TID) | INTRAMUSCULAR | Status: DC
  Administered 2023-05-24 – 2023-05-25 (×2): 2 [IU] via SUBCUTANEOUS
  Administered 2023-05-25 – 2023-05-26 (×2): 3 [IU] via SUBCUTANEOUS
  Administered 2023-05-26 – 2023-05-27 (×3): 2 [IU] via SUBCUTANEOUS

## 2023-05-24 MED ORDER — DIGOXIN 125 MCG PO TABS
125.0000 ug | ORAL_TABLET | Freq: Every day | ORAL | Status: DC
  Administered 2023-05-24 – 2023-05-27 (×4): 125 ug via ORAL
  Filled 2023-05-24 (×4): qty 1

## 2023-05-24 MED ORDER — ASPIRIN 81 MG PO TBEC: 81.0000 mg | DELAYED_RELEASE_TABLET | Freq: Every day | ORAL | Status: DC

## 2023-05-24 MED ORDER — HEPARIN SODIUM (PORCINE) 5000 UNIT/ML IJ SOLN
5000.0000 [IU] | Freq: Three times a day (TID) | INTRAMUSCULAR | Status: DC
  Administered 2023-05-24 – 2023-05-27 (×8): 5000 [IU] via SUBCUTANEOUS
  Filled 2023-05-24 (×8): qty 1

## 2023-05-24 MED ORDER — IOHEXOL 350 MG/ML SOLN
100.0000 mL | Freq: Once | INTRAVENOUS | Status: AC | PRN
  Administered 2023-05-24: 100 mL via INTRAVENOUS

## 2023-05-24 NOTE — Progress Notes (Signed)
PT Cancellation Note  Patient Details Name: AURIEL DELTORO MRN: [id number] DOB: 06/30/1961   Cancelled Treatment:    Reason Eval/Treat Not Completed: Patient at procedure or test/unavailable PT orders received, chart reviewed. Pt noted to be off the floor at MRI, will f/u as able.  Aleda Grana, PT, DPT 05/24/23, 3:50 PM   Sandi Mariscal 05/24/2023, 3:50 PM

## 2023-05-24 NOTE — Telephone Encounter (Signed)
Called and spoke with patient. Having stroke like symptoms. Difficulty finding words, blurred vision, changes in balance. Advised pt to call 911 and go to ED. EP providers made aware d/t likely LV malfunction. Last known normal 0900 05/24/23.

## 2023-05-24 NOTE — H&P (Signed)
Date: 05/24/2023               Patient Name:  Lynn Estrada MRN: [id number]  DOB: [birth year] Age / Sex: 62 y.o., female   PCP: Porfirio Oar, PA         Medical Service: Internal Medicine Teaching Service         Attending Physician: Dr. Bonnetta Barry att. providers found      First Contact: Dr. Jeral Pinch, DO Pager [phone]    Second Contact: Dr. Modena Slater, DO Pager [phone]         After Hours (After 5p/  First Contact Pager: [phone]  weekends / holidays): Second Contact Pager: [phone]   SUBJECTIVE   Chief Complaint: left side weakness yesterday, slurred speech this AM  History of Present Illness:  Lynn Estrada is a 62 year old with PMH of HFrEF (EF <20% 07/24), LBBB with defibrillator in place who presents with left sided numbness and tingling that started yesterday around noon. She feels like her vision got worse on left side as well. Endorses dizziness and light headedness.  This morning she had some slurred speech so came to ED, after calling PCP.  She took 3 tablets of aspirin 81 mg prior to coming to hospital.  She came back from a cruise last week and had a cold. She didn't improve so went to PCP and was prescribed amoxicillin BID to treat sinus infection and would have finished tomorrow.  Her symptoms have improved from recent sinus infection.  ED Course: Code stroke activated.  Patient outside window for interventions.  Past Medical History HFrEF Defibrillator Nephrolithiasis, partial right nephrectomy  Meds:  Asa 81 mg every day Atorvastatin 10 mg Digoxin 0.125 mg every day Jardiance 10 mg Entresto 24-26 mg bid Furosemide 40 mg every day Spironolactone 25 mg  Past Surgical History:  Procedure Laterality Date   ABDOMINAL HYSTERECTOMY  01/20/2007   w/ Lysis Adhesions/  Left salpingoophorectomy/  Right Salpingectomy   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   Procedure: BIV ICD INSERTION CRT-D;  Surgeon: Maurice Small, MD;  Location: [facility];   Service: Cardiovascular;  Laterality: N/A;   CHOLECYSTECTOMY  1994   and Right Partial Nephrectomy (pelvis for very large stone)   COLONOSCOPY WITH PROPOFOL N/A 03/26/2023   Procedure: COLONOSCOPY WITH PROPOFOL;  Surgeon: Iva Boop, MD;  Location: [facility];  Service: Gastroenterology;  Laterality: N/A;   CYSTOSCOPY W/ URETERAL STENT PLACEMENT Left 12/31/2016   Procedure: CYSTOSCOPY WITH RETROGRADE PYELOGRAM/ LEFT URETERAL STENT PLACEMENT;  Surgeon: Sebastian Ache, MD;  Location: [facility];  Service: Urology;  Laterality: Left;   CYSTOSCOPY WITH RETROGRADE PYELOGRAM, URETEROSCOPY AND STENT PLACEMENT Left 01/18/2017   Procedure: 1ST STAGE CYSTOSCOPY WITH RETROGRADE PYELOGRAM, URETEROSCOPY AND STENT REPLACEMENT;  Surgeon: Sebastian Ache, MD;  Location[facility];  Service: Urology;  Laterality: Left;   CYSTOSCOPY WITH RETROGRADE PYELOGRAM, URETEROSCOPY AND STENT PLACEMENT Left 02/01/2017   Procedure: 2ND STAGE CYSTOSCOPY WITH RETROGRADE PYELOGRAM, URETEROSCOPY AND STENT REPLACEMENT;  Surgeon: Sebastian Ache, MD;  Location: [facility];  Service: Urology;  Laterality: Left;   ECTOPIC PREGNANCY SURGERY  1988   Left Salpingectomy   HOLMIUM LASER APPLICATION Left 01/18/2017   Procedure: HOLMIUM LASER APPLICATION;  Surgeon: Sebastian Ache, MD;  Location: [facility];  Service: Urology;  Laterality: Left;   HOLMIUM LASER APPLICATION Left 02/01/2017   Procedure: HOLMIUM LASER APPLICATION;  Surgeon: Sebastian Ache, MD;  Location: [facility];  Service: Urology;  Laterality: Left;   LUMBAR DISC SURGERY  01/1999   right L5 -- S1   POLYPECTOMY  03/26/2023   Procedure: POLYPECTOMY;  Surgeon: Iva Boop, MD;  Location: [facility];  Service: Gastroenterology;;   RE-EXPLORATION LUMBAR/  LAMINECTOMY AND MICRODISKECTOMY  10/14/2001   right L5 -- S1   RIGHT HEART CATH N/A 05/23/2022   Procedure: RIGHT HEART CATH;  Surgeon: Dolores Patty,  MD;  Location: [facility];  Service: Cardiovascular;  Laterality: N/A;   RIGHT/LEFT HEART CATH AND CORONARY ANGIOGRAPHY N/A 07/18/2021   Procedure: RIGHT/LEFT HEART CATH AND CORONARY ANGIOGRAPHY;  Surgeon: Dolores Patty, MD;  Location: [facility];  Service: Cardiovascular;  Laterality: N/A;   TUBAL LIGATION Bilateral 10/17/1999   PPTL   UMBILICAL HERNIA REPAIR  age 43    Social:  Lives With: in [place] with husband, daughter also lives in area Occupation: Not currently working Support: Family Level of Function: Independent in ADLs PCP: [facility] Substances: Denies tobacco use, and alcohol  Family History:  Mom- HTN  Allergies: Allergies as of 05/24/2023 - Review Complete 05/24/2023  Allergen Reaction Noted   Latex Rash and Hives 01/14/2017   Bactrim [sulfamethoxazole-trimethoprim] Rash 02/07/2014   Carvedilol Itching 03/01/2022   Metoprolol Itching and Rash 03/01/2022    Review of Systems: A complete ROS was negative except as per HPI.   OBJECTIVE:   Physical Exam: Blood pressure 113/85, pulse 72, temperature 98.2 F (36.8 C), temperature source Oral, resp. rate 12, height 6\' 1"  (1.854 m), weight 95.8 kg, SpO2 99%.  Constitutional: well-appearing, family at bedside Cardiovascular: regular rate and rhythm, no m/r/g Pulmonary/Chest: normal work of breathing on room air, lungs clear to auscultation bilaterally Abdominal: soft, non-tender, non-distended MSK: normal bulk and tone Mental Status: Patient is awake, alert, oriented x3 No signs of aphasia or neglect Cranial Nerves: II: Pupils equal, round, and reactive to light.   III,IV, VI: EOMI without ptosis or diploplia.  V: Facial sensation is symmetric to light touch and temperature. VII: Facial movement is symmetric.  VIII: Hearing is intact to voice X: Uvula elevates symmetrically XI: Shoulder shrug is symmetric. XII: Tongue is midline without atrophy or fasciculations.  Motor: good  effort thorughout, 4 out of 5 to left upper extremity, 5 out of 5 to right upper extremity, 5 out of 5 to bilateral lower extremities Sensory: Sensation is grossly intact bilateral UE & LE Cerebellar: Finger-Nose and Heel-Shin intact bilaterally   Labs: CBC    Component Value Date/Time   WBC 6.3 05/24/2023 1157   RBC 4.15 05/24/2023 1157   HGB 11.9 (L) 05/24/2023 1157   HGB 12.0 12/21/2022 0908   HCT 37.5 05/24/2023 1157   HCT 37.1 12/21/2022 0908   PLT 246 05/24/2023 1157   PLT 258 12/21/2022 0908   MCV 90.4 05/24/2023 1157   MCV 87 12/21/2022 0908   MCH 28.7 05/24/2023 1157   MCHC 31.7 05/24/2023 1157   RDW 13.6 05/24/2023 1157   RDW 12.8 12/21/2022 0908   LYMPHSABS 1.7 12/21/2022 0908   MONOABS 0.5 09/07/2022 0534   EOSABS 0.1 12/21/2022 0908   BASOSABS 0.0 12/21/2022 0908    Lab Results  Component Value Date   HGBA1C 7.1 (H) 05/24/2023    Lab Results  Component Value Date   CHOL 130 05/24/2023   HDL 27 (L) 05/24/2023   LDLCALC 60 05/24/2023   TRIG 217 (H) 05/24/2023   CHOLHDL 4.8 05/24/2023    CMP  Creatinine of 1.1, around baseline  Imaging: CT angio head/neck with possible occlusion of distal ACA  EKG: Ventricular paced rhythm  ASSESSMENT & PLAN:   Assessment & Plan by Problem: Principal Problem:   Left-sided weakness   Teresha D Soderman is a 62 y.o. person living with a history of HFrEF, EF 20 to 25%, left bundle branch block with defibrillator, who presented with left-sided weakness and aphasia and admitted for TIA versus stroke evaluation on hospital day 0  Left-sided weakness Aphasia Patient presenting with more than 24 hours of left-sided weakness.  Aphasia started this morning when she woke up.  Her presentation is concerning for TIA versus acute stroke. CTA head a showed a possible small distal ACA occlusion.  CT head was negative for stroke.  Appreciate nursing coordination with helping turn off defibrillator so the patient can get her MRI.    -Continue Lipitor 10 mg, LDL at goal for secondary prevention -Aspirin 81 mg, plavix 75 mg -Telemetry -Remissive hypertension with concerns for stroke, SBP goal less than 220 -Passed swallow screen  Diabetes New diagnosis for her.  A1c elevated at 7.1.  Lab came back after my evaluation.  Team tomorrow will discuss diagnosis with her.  Would discharged with metformin if patient is open to this option.  She is already taking an SGLT2 in the setting of her heart failure with reduced ejection fraction.  Could consider GLP-1 as well.  -SSI -Continue empagliflozin 10 mg  HFrEF (EF 20-25%) Cardiac catheterization in January 2023 showed minimal nonobstructive CAD.  cMRI completed at that time and was not consistent with infiltrative disease. Appears euvolemic on exam.  Weight at 95.8 kg in emergency room.  Weight at recent cardiology visit was 96.6 kg. -Holding Entresto -Continue digoxin 125 mcg -Continue empagliflozin -Continue furosemide 40 mg -Daily weights and strict I's and O's -Trend BMP  History of partial right nephrectomy On chart review this occurred in the 90s.  Reportedly due to a large stone.  Diet: Normal VTE: Heparin IVF: None,None Code: Full  Prior to Admission Living Arrangement: Home, living husband Anticipated Discharge Location: husband Barriers to Discharge: stroke evaluation  Dispo: Admit patient to Observation with expected length of stay less than 2 midnights.  Signed: Rudene Christians, DO Internal Medicine Resident PGY-3  05/24/2023, 3:42 PM

## 2023-05-24 NOTE — Hospital Course (Addendum)
Pmhx: CHFrEF, LBBB, partial right nephrectomy, hypertension  EF is persistently less than 25% NYHA III sacubitril/valsartan 24 to 26 mg, furosemide 40 mg, digoxin 125 mcg Abbott CRT defibrillator in place   cODE STROKE 1200 NON. LEFT LEG  9am SPOEECH  CONTINUES LEFT SIDED WEAKNESS   Pacemaker

## 2023-05-24 NOTE — ED Provider Notes (Signed)
[place] EMERGENCY DEPARTMENT AT [facility] Provider Note   CSN: [id number] Arrival date & time: 05/24/23  1057     History  Chief Complaint  Patient presents with   Weakness   Code Stroke    Lynn Estrada is a 62 y.o. female.  Pt is a 62 yo female with pmhx sig for htn, gastritis, gerd, kidney stones, chf, copd, dm, cad, and hld.  Pt noticed that she had some left sided weakness around 1200 yesterday.  This am, around 0900, she had some slurred speech which has resolved.  She called EMS who called a code stroke en route.       Home Medications Prior to Admission medications   Medication Sig Start Date End Date Taking? Authorizing Provider  acetaminophen (TYLENOL) 500 MG tablet Take 500-1,000 mg by mouth every 6 (six) hours as needed (for pain.).    [provider]  aspirin 81 MG EC tablet Take 81 mg by mouth daily with lunch.    [provider]  atorvastatin (LIPITOR) 10 MG tablet Take 10 mg by mouth daily. 05/15/21   [provider]  digoxin (LANOXIN) 0.125 MG tablet TAKE 1 TABLET BY MOUTH EVERY DAY 03/12/23   Bensimhon, Bevelyn Buckles, MD  empagliflozin (JARDIANCE) 10 MG TABS tablet Take 1 tablet (10 mg total) by mouth daily. 03/18/23   Bensimhon, Bevelyn Buckles, MD  furosemide (LASIX) 40 MG tablet Take 40 mg by mouth daily.    [provider]  Multiple Vitamin (MULTIVITAMIN WITH MINERALS) TABS tablet Take 1 tablet by mouth daily with lunch. One A Day for Women 50+    [provider]  sacubitril-valsartan (ENTRESTO) 24-26 MG Take 1 tablet by mouth 2 (two) times daily. 05/21/22   Bensimhon, Bevelyn Buckles, MD  spironolactone (ALDACTONE) 25 MG tablet TAKE 1 TABLET (25 MG TOTAL) BY MOUTH DAILY. 12/31/22   Bensimhon, Bevelyn Buckles, MD      Allergies    Latex, Bactrim [sulfamethoxazole-trimethoprim], Carvedilol, and Metoprolol    Review of Systems   Review of Systems  Neurological:  Positive for weakness.  All other systems reviewed and  are negative.   Physical Exam Updated Vital Signs BP 113/85 (BP Location: Left Arm)   Pulse 72   Temp 98.2 F (36.8 C) (Oral)   Resp 12   Ht 6\' 1"  (1.854 m)   Wt 95.8 kg   SpO2 99%   BMI 27.86 kg/m  Physical Exam Vitals and nursing note reviewed.  Constitutional:      Appearance: Normal appearance.  HENT:     Head: Normocephalic and atraumatic.     Right Ear: External ear normal.     Left Ear: External ear normal.     Nose: Nose normal.     Mouth/Throat:     Mouth: Mucous membranes are moist.     Pharynx: Oropharynx is clear.  Eyes:     Extraocular Movements: Extraocular movements intact.     Conjunctiva/sclera: Conjunctivae normal.     Pupils: Pupils are equal, round, and reactive to light.  Cardiovascular:     Rate and Rhythm: Normal rate and regular rhythm.     Pulses: Normal pulses.     Heart sounds: Normal heart sounds.  Pulmonary:     Effort: Pulmonary effort is normal.     Breath sounds: Normal breath sounds.  Abdominal:     General: Abdomen is flat. Bowel sounds are normal.     Palpations: Abdomen is soft.  Musculoskeletal:  General: Normal range of motion.     Cervical back: Normal range of motion and neck supple.  Skin:    General: Skin is warm.     Capillary Refill: Capillary refill takes less than 2 seconds.  Neurological:     Mental Status: She is alert and oriented to person, place, and time.     Comments: Slight left leg weakness  Psychiatric:        Mood and Affect: Mood normal.        Behavior: Behavior normal.        Thought Content: Thought content normal.        Judgment: Judgment normal.     ED Results / Procedures / Treatments   Labs (all labs ordered are listed, but only abnormal results are displayed) Labs Reviewed  CBC - Abnormal; Notable for the following components:      Result Value   Hemoglobin 11.9 (*)    All other components within normal limits  CBG MONITORING, ED - Abnormal; Notable for the following components:    Glucose-Capillary 141 (*)    All other components within normal limits  I-STAT CHEM 8, ED - Abnormal; Notable for the following components:   Creatinine, Ser 1.10 (*)    Glucose, Bld 155 (*)    Calcium, Ion 1.10 (*)    All other components within normal limits  DIGOXIN LEVEL  COMPREHENSIVE METABOLIC PANEL  URINALYSIS, ROUTINE W REFLEX MICROSCOPIC  CBC  COMPREHENSIVE METABOLIC PANEL    EKG EKG Interpretation Date/Time:  Friday May 24 2023 11:36:55 EST Ventricular Rate:  76 PR Interval:  125 QRS Duration:  180 QT Interval:  470 QTC Calculation: 529 R Axis:   -85  Text Interpretation: atrial sensed ventricular paced rhythm No significant change since last tracing Confirmed by Jacalyn Lefevre [phone]) on 05/24/2023 11:39:31 AM  Radiology CT ANGIO HEAD NECK W WO CM W PERF (CODE STROKE)  Result Date: 05/24/2023 CLINICAL DATA:  62 year old female code stroke presentation. EXAM: CT ANGIOGRAPHY HEAD AND NECK CT PERFUSION BRAIN TECHNIQUE: Multidetector CT imaging of the head and neck was performed using the standard protocol during bolus administration of intravenous contrast. Multiplanar CT image reconstructions and MIPs were obtained to evaluate the vascular anatomy. Carotid stenosis measurements (when applicable) are obtained utilizing NASCET criteria, using the distal internal carotid diameter as the denominator. Multiphase CT imaging of the brain was performed following IV bolus contrast injection. Subsequent parametric perfusion maps were calculated using RAPID software. RADIATION DOSE REDUCTION: This exam was performed according to the departmental dose-optimization program which includes automated exposure control, adjustment of the mA and/or kV according to patient size and/or use of iterative reconstruction technique. CONTRAST:  OMNIPAQUE IOHEXOL 350 MG/ML SOLN COMPARISON:  Head CT today. FINDINGS: CT Brain Perfusion Findings: ASPECTS: 10 CBF (<30%) Volume: 0mL Perfusion  (Tmax>6.0s) volume: 0mL Mismatch Volume: Not applicable Infarction Location:Not applicable CTA NECK Skeleton: Widespread advanced cervical spine degeneration. Multilevel degenerative spinal stenosis. No acute osseous abnormality identified. Upper chest: Left chest pacemaker type device. Negative visible mediastinum. Minor dependent atelectasis. Other neck: Glottis is closed. Partially retropharyngeal course of both carotids on series 7, image 92 (normal variant). Aortic arch: Bovine arch configuration. Moderate Calcified aortic atherosclerosis. Mild arch tortuosity. Right carotid system: Tortuous brachiocephalic artery. Mildly tortuous right CCA origin. No plaque or stenosis. Negative right carotid bifurcation. Partially retropharyngeal right ICA without plaque or stenosis. Left carotid system: Tortuous bovine CCA origin without plaque or stenosis. Mild calcified plaque at the proximal left  ICA without stenosis. Partially retropharyngeal course with tortuosity. Vertebral arteries: Proximal right subclavian artery and right vertebral artery origin are normal. Right vertebral is patent with tortuosity to the skull base, fairly codominant. Proximal left subclavian artery soft and calcified plaque without stenosis. Normal left vertebral artery origin. Tortuous V1 segment (series 9, image 122). Patent left vertebral to the skull base with no significant plaque or stenosis. CTA HEAD Posterior circulation: Mild left V4 calcified plaque. Dominant left V4 segment. No distal vertebral or vertebrobasilar junction stenosis. Dominant appearing AICAs are patent. Patent basilar artery. There is mild mid basilar irregularity but no significant stenosis. Patent SCA origins. Fetal type bilateral PCA origins. Left PCA branches are within normal limits. Right PCA mild P2 segment irregularity and stenosis, otherwise within normal limits. Anterior circulation: Both ICA siphons are patent. Minimal left siphon calcified plaque without  stenosis. Mild right siphon calcified plaque without stenosis. Normal posterior communicating artery origins. Patent carotid termini, MCA and ACA origins. Dominant left A1 segment. Normal anterior communicating artery. Bilateral ACA branches are within normal limits except for abrupt termination of a small distal ACA branch or possibly venous branch contamination series 12, image 22. Left MCA M1 segment is tortuous. Left MCA trifurcation is patent without stenosis. Right MCA M1 segment is tortuous. Right MCA bifurcation is patent without stenosis. Bilateral MCA branches are within normal limits. Venous sinuses: Patent. Anatomic variants: Mildly dominant left vertebral artery. Fetal type PCA origins. Mildly dominant left ACA A1. Review of the MIP images confirms the above findings IMPRESSION: 1. Negative CT Perfusion. And CTA is negative for large vessel occlusion. But there is a questionable distal ACA occlusion (series 12, image 22, probably A4), but might instead be venous artifact. 2. Otherwise generally mild for age atherosclerosis in the head and neck. No other hemodynamically significant arterial stenosis. 3.  Aortic Atherosclerosis (ICD10-I70.0). Study discussed by telephone with Dr. Erick Blinks on 05/24/2023 at 11:41 . Electronically Signed   By: Odessa Fleming M.D.   On: 05/24/2023 11:41   CT HEAD CODE STROKE WO CONTRAST`  Result Date: 05/24/2023 CLINICAL DATA:  Code stroke. EXAM: CT HEAD WITHOUT CONTRAST TECHNIQUE: Contiguous axial images were obtained from the base of the skull through the vertex without intravenous contrast. RADIATION DOSE REDUCTION: This exam was performed according to the departmental dose-optimization program which includes automated exposure control, adjustment of the mA and/or kV according to patient size and/or use of iterative reconstruction technique. COMPARISON:  None Available. FINDINGS: Brain: No evidence of acute large vascular territory infarction, hemorrhage,  hydrocephalus, extra-axial collection or mass lesion/mass effect. Vascular: No hyperdense vessel. Skull: No acute fracture. Sinuses/Orbits: Clear sinuses.  No acute orbital findings. ASPECTS [facility] Stroke Program Early CT Score) total score (0-10 with 10 being normal): 10. IMPRESSION: 1. No evidence of acute large vascular territory infarct or acute hemorrhage. 2. ASPECTS is 10. Code stroke imaging results were communicated on 05/24/2023 at 11:18 am to provider Dr. Derry Lory via secure text paging. Electronically Signed   By: Feliberto Harts M.D.   On: 05/24/2023 11:18    Procedures Procedures    Medications Ordered in ED Medications  clopidogrel (PLAVIX) tablet 75 mg (has no administration in time range)  iohexol (OMNIPAQUE) 350 MG/ML injection 100 mL (100 mLs Intravenous Contrast Given 05/24/23 1127)    ED Course/ Medical Decision Making/ A&P  Medical Decision Making Amount and/or Complexity of Data Reviewed Labs: ordered.  Risk Prescription drug management. Decision regarding hospitalization.   This patient presents to the ED for concern of cva, this involves an extensive number of treatment options, and is a complaint that carries with it a high risk of complications and morbidity.  The differential diagnosis includes cva, tia, electrolyte abn, hypoglycemia   Co morbidities that complicate the patient evaluation  r htn, gastritis, gerd, kidney stones, chf, copd, dm, cad, and hld   Additional history obtained:  Additional history obtained from epic chart review External records from outside source obtained and reviewed including EMS report   Lab Tests:  I Ordered, and personally interpreted labs.  The pertinent results include:  cbc nl, chem 8 neg   Imaging Studies ordered:  I ordered imaging studies including ct head, ct angio  I independently visualized and interpreted imaging which showed  CT head: No evidence of acute large  vascular territory infarct or acute  hemorrhage.  2. ASPECTS is 10.  CTA head: 1. Negative CT Perfusion. And CTA is negative for large vessel  occlusion. But there is a questionable distal ACA occlusion (series  12, image 22, probably A4), but might instead be venous artifact.  2. Otherwise generally mild for age atherosclerosis in the head and  neck. No other hemodynamically significant arterial stenosis.  3.  Aortic Atherosclerosis (ICD10-I70.0).   I agree with the radiologist interpretation   Cardiac Monitoring:  The patient was maintained on a cardiac monitor.  I personally viewed and interpreted the cardiac monitored which showed an underlying rhythm of: nsr   Medicines ordered and prescription drug management:  I ordered medication including plavix  for cva  Reevaluation of the patient after these medicines showed that the patient improved I have reviewed the patients home medicines and have made adjustments as needed   Test Considered:  Ct/mri   Critical Interventions:  Code stroke   Consultations Obtained:  I requested consultation with the neurologist (Dr. Derry Lory),  and discussed lab and imaging findings as well as pertinent plan - he recommends: admission for stroke work up.  Add Plavix 75 mg daily. Pt d/w IMTS for admission   Problem List / ED Course:  CVA:  sx started yesterday, so she is out of the window for tnk.  She does not have a LVO, so does not need to go to IR.  MRI will be done.  She will need admission for stroke work up.    Reevaluation:  After the interventions noted above, I reevaluated the patient and found that they have :improved   Social Determinants of Health:  Lives at home   Dispostion:  After consideration of the diagnostic results and the patients response to treatment, I feel that the patent would benefit from admission.  CRITICAL CARE Performed by: Jacalyn Lefevre   Total critical care time: 30  minutes  Critical care time was exclusive of separately billable procedures and treating other patients.  Critical care was necessary to treat or prevent imminent or life-threatening deterioration.  Critical care was time spent personally by me on the following activities: development of treatment plan with patient and/or surrogate as well as nursing, discussions with consultants, evaluation of patient's response to treatment, examination of patient, obtaining history from patient or surrogate, ordering and performing treatments and interventions, ordering and review of laboratory studies, ordering and review of radiographic studies, pulse oximetry and re-evaluation of patient's condition.  Final Clinical Impression(s) / ED Diagnoses Final diagnoses:  Cerebrovascular accident (CVA), unspecified mechanism (HCC)    Rx / DC Orders ED Discharge Orders     None         Jacalyn Lefevre, MD 05/24/23 1225

## 2023-05-24 NOTE — ED Triage Notes (Signed)
Delayed entry due to direct patient care. Patient presented via EMS as pre-hospital code stroke. Patient reports that around 1200 yesterday she started experiencing left-sided weakness. Patient states that she had an episode of slurred speech around 0900 today that has since resolved and felt that her weakness was worsening. Patient is AAOx4 upon arrival. Patient denies headache or dizziness.

## 2023-05-24 NOTE — ED Notes (Signed)
ED TO INPATIENT HANDOFF REPORT  ED Nurse Name and Phone #: Einar Grad [phone]  S Name/Age/Gender Lynn Estrada 62 y.o. female Room/Bed: 027C/027C  Code Status   Code Status: Full Code  Home/SNF/Other Home Patient oriented to: self, place, time, and situation Is this baseline? Yes   Triage Complete: Triage complete  Chief Complaint Left-sided weakness [R53.1]  Triage Note Delayed entry due to direct patient care. Patient presented via EMS as pre-hospital code stroke. Patient reports that around 1200 yesterday she started experiencing left-sided weakness. Patient states that she had an episode of slurred speech around 0900 today that has since resolved and felt that her weakness was worsening. Patient is AAOx4 upon arrival. Patient denies headache or dizziness.   Allergies Allergies  Allergen Reactions   Latex Rash and Hives   Bactrim [Sulfamethoxazole-Trimethoprim] Rash   Carvedilol Itching   Metoprolol Itching and Rash    Level of Care/Admitting Diagnosis ED Disposition     ED Disposition  Admit   Condition  --   Comment  Hospital Area: [facility] [[id number]]  Level of Care: Telemetry Medical [104]  May place patient in observation at [facility] or [facility] if equivalent level of care is available:: No  Covid Evaluation: Asymptomatic - no recent exposure (last 10 days) testing not required  Diagnosis: Left-sided weakness [604540]  Admitting Physician: Reymundo Poll [[id number]]  Attending Physician: Reymundo Poll [[id number]]          B Medical/Surgery History Past Medical History:  Diagnosis Date   Anemia    Arthritis    CHF (congestive heart failure) (HCC) 12/06/2022   EF is 20% to 25 %   COPD (chronic obstructive pulmonary disease) (HCC)    Diabetes mellitus without complication (HCC)    Eczema    GERD (gastroesophageal reflux disease)    History of adenomatous polyp of colon    08-03-2016  tubular adenoma x2 and hyperplastic    History of chronic gastritis 08/03/2016   History of ectopic pregnancy 1988   s/p  left salpingectomy   History of endometriosis    History of kidney stones    History of partial nephrectomy    right pelvis for very large stone   History of pneumonia 07/02/2016   CAP   History of sepsis    07-01-2016  sepsis w/ pyelonephritis, CAP /   12-31-2016  urosepsis w/ kidney stone obstruction   History of uterine leiomyoma    Hx of adenomatous colonic polyps 08/03/2016   2 adenomas, 1 hpp and 1 lost polyp 2018 all < 1 cm    Hyperlipidemia    Hypertension    Left ureteral stone    Myocardial infarction [facility])    Nephrolithiasis    left obstructive stone and right non-obstructive stone  per CT 12-31-2016   Urgency of urination    Past Surgical History:  Procedure Laterality Date   ABDOMINAL HYSTERECTOMY  01/20/2007   w/ Lysis Adhesions/  Left salpingoophorectomy/  Right Salpingectomy   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   Procedure: BIV ICD INSERTION CRT-D;  Surgeon: Maurice Small, MD;  [facility]: [facility];  Service: Cardiovascular;  Laterality: N/A;   CHOLECYSTECTOMY  1994   and Right Partial Nephrectomy (pelvis for very large stone)   COLONOSCOPY WITH PROPOFOL N/A 03/26/2023   Procedure: COLONOSCOPY WITH PROPOFOL;  Surgeon: Iva Boop, MD;  Location: [facility];  Service: Gastroenterology;  Laterality: N/A;   CYSTOSCOPY W/ URETERAL STENT PLACEMENT Left 12/31/2016  Procedure: CYSTOSCOPY WITH RETROGRADE PYELOGRAM/ LEFT URETERAL STENT PLACEMENT;  Surgeon: Sebastian Ache, MD;  Location: [facility];  Service: Urology;  Laterality: Left;   CYSTOSCOPY WITH RETROGRADE PYELOGRAM, URETEROSCOPY AND STENT PLACEMENT Left 01/18/2017   Procedure: 1ST STAGE CYSTOSCOPY WITH RETROGRADE PYELOGRAM, URETEROSCOPY AND STENT REPLACEMENT;  Surgeon: Sebastian Ache, MD;  Location: [facility];  Service: Urology;  Laterality: Left;   CYSTOSCOPY WITH RETROGRADE PYELOGRAM,  URETEROSCOPY AND STENT PLACEMENT Left 02/01/2017   Procedure: 2ND STAGE CYSTOSCOPY WITH RETROGRADE PYELOGRAM, URETEROSCOPY AND STENT REPLACEMENT;  Surgeon: Sebastian Ache, MD;  Location: [facility];  Service: Urology;  Laterality: Left;   ECTOPIC PREGNANCY SURGERY  1988   Left Salpingectomy   HOLMIUM LASER APPLICATION Left 01/18/2017   Procedure: HOLMIUM LASER APPLICATION;  Surgeon: Sebastian Ache, MD;  Location: [facility];  Service: Urology;  Laterality: Left;   HOLMIUM LASER APPLICATION Left 02/01/2017   Procedure: HOLMIUM LASER APPLICATION;  Surgeon: Sebastian Ache, MD;  Location: [facility];  Service: Urology;  Laterality: Left;   LUMBAR DISC SURGERY  01/1999   right L5 -- S1   POLYPECTOMY  03/26/2023   Procedure: POLYPECTOMY;  Surgeon: Iva Boop, MD;  Location: [facility];  Service: Gastroenterology;;   RE-EXPLORATION LUMBAR/  LAMINECTOMY AND MICRODISKECTOMY  10/14/2001   right L5 -- S1   RIGHT HEART CATH N/A 05/23/2022   Procedure: RIGHT HEART CATH;  Surgeon: Dolores Patty, MD;  [facility]: [facility];  Service: Cardiovascular;  Laterality: N/A;   RIGHT/LEFT HEART CATH AND CORONARY ANGIOGRAPHY N/A 07/18/2021   Procedure: RIGHT/LEFT HEART CATH AND CORONARY ANGIOGRAPHY;  Surgeon: Dolores Patty, MD;  Location: [facility];  Service: Cardiovascular;  Laterality: N/A;   TUBAL LIGATION Bilateral 10/17/1999   PPTL   UMBILICAL HERNIA REPAIR  age 5     A IV Location/Drains/Wounds Patient Lines/Drains/Airways Status     Active Line/Drains/Airways     Name Placement date Placement time Site Days   Peripheral IV 05/24/23 18 G Right Antecubital 05/24/23  --  Antecubital  less than 1   Ureteral Drain/Stent Left ureter 6 Fr. 02/01/17  0916  Left ureter  2303            Intake/Output Last 24 hours No intake or output data in the 24 hours ending 05/24/23 1325  Labs/Imaging Results for orders placed or  performed during the hospital encounter of 05/24/23 (from the past 48 hour(s))  CBG monitoring, ED     Status: Abnormal   Collection Time: 05/24/23 11:01 AM  Result Value Ref Range   Glucose-Capillary 141 (H) 70 - 99 mg/dL    Comment: Glucose reference range applies only to samples taken after fasting for at least 8 hours.  I-stat chem 8, ed     Status: Abnormal   Collection Time: 05/24/23 11:08 AM  Result Value Ref Range   Sodium 140 135 - 145 mmol/L   Potassium 4.1 3.5 - 5.1 mmol/L   Chloride 105 98 - 111 mmol/L   BUN 19 8 - 23 mg/dL   Creatinine, Ser 7.82 (H) 0.44 - 1.00 mg/dL   Glucose, Bld 956 (H) 70 - 99 mg/dL    Comment: Glucose reference range applies only to samples taken after fasting for at least 8 hours.   Calcium, Ion 1.10 (L) 1.15 - 1.40 mmol/L   TCO2 24 22 - 32 mmol/L   Hemoglobin 12.2 12.0 - 15.0 g/dL   HCT 21.3 08.6 -  46.0 %  Digoxin level     Status: Abnormal   Collection Time: 05/24/23 11:36 AM  Result Value Ref Range   Digoxin Level 0.5 (L) 0.8 - 2.0 ng/mL    Comment: Performed at [facility], [address], [place], [place] [address]  CBC     Status: Abnormal   Collection Time: 05/24/23 11:57 AM  Result Value Ref Range   WBC 6.3 4.0 - 10.5 K/uL   RBC 4.15 3.87 - 5.11 MIL/uL   Hemoglobin 11.9 (L) 12.0 - 15.0 g/dL   HCT 60.4 54.0 - 98.1 %   MCV 90.4 80.0 - 100.0 fL   MCH 28.7 26.0 - 34.0 pg   MCHC 31.7 30.0 - 36.0 g/dL   RDW 19.1 47.8 - 29.5 %   Platelets 246 150 - 400 K/uL   nRBC 0.0 0.0 - 0.2 %    Comment: Performed at [facility], [address] [place], [place] [address]   CT ANGIO HEAD NECK W WO CM W PERF (CODE STROKE)  Result Date: 05/24/2023 CLINICAL DATA:  62 year old female code stroke presentation. EXAM: CT ANGIOGRAPHY HEAD AND NECK CT PERFUSION BRAIN TECHNIQUE: Multidetector CT imaging of the head and neck was performed using the standard protocol during bolus administration of intravenous contrast. Multiplanar CT image  reconstructions and MIPs were obtained to evaluate the vascular anatomy. Carotid stenosis measurements (when applicable) are obtained utilizing NASCET criteria, using the distal internal carotid diameter as the denominator. Multiphase CT imaging of the brain was performed following IV bolus contrast injection. Subsequent parametric perfusion maps were calculated using RAPID software. RADIATION DOSE REDUCTION: This exam was performed according to the departmental dose-optimization program which includes automated exposure control, adjustment of the mA and/or kV according to patient size and/or use of iterative reconstruction technique. CONTRAST:  OMNIPAQUE IOHEXOL 350 MG/ML SOLN COMPARISON:  Head CT today. FINDINGS: CT Brain Perfusion Findings: ASPECTS: 10 CBF (<30%) Volume: 0mL Perfusion (Tmax>6.0s) volume: 0mL Mismatch Volume: Not applicable Infarction Location:Not applicable CTA NECK Skeleton: Widespread advanced cervical spine degeneration. Multilevel degenerative spinal stenosis. No acute osseous abnormality identified. Upper chest: Left chest pacemaker type device. Negative visible mediastinum. Minor dependent atelectasis. Other neck: Glottis is closed. Partially retropharyngeal course of both carotids on series 7, image 92 (normal variant). Aortic arch: Bovine arch configuration. Moderate Calcified aortic atherosclerosis. Mild arch tortuosity. Right carotid system: Tortuous brachiocephalic artery. Mildly tortuous right CCA origin. No plaque or stenosis. Negative right carotid bifurcation. Partially retropharyngeal right ICA without plaque or stenosis. Left carotid system: Tortuous bovine CCA origin without plaque or stenosis. Mild calcified plaque at the proximal left ICA without stenosis. Partially retropharyngeal course with tortuosity. Vertebral arteries: Proximal right subclavian artery and right vertebral artery origin are normal. Right vertebral is patent with tortuosity to the skull base, fairly  codominant. Proximal left subclavian artery soft and calcified plaque without stenosis. Normal left vertebral artery origin. Tortuous V1 segment (series 9, image 122). Patent left vertebral to the skull base with no significant plaque or stenosis. CTA HEAD Posterior circulation: Mild left V4 calcified plaque. Dominant left V4 segment. No distal vertebral or vertebrobasilar junction stenosis. Dominant appearing AICAs are patent. Patent basilar artery. There is mild mid basilar irregularity but no significant stenosis. Patent SCA origins. Fetal type bilateral PCA origins. Left PCA branches are within normal limits. Right PCA mild P2 segment irregularity and stenosis, otherwise within normal limits. Anterior circulation: Both ICA siphons are patent. Minimal left siphon calcified plaque without stenosis.  Mild right siphon calcified plaque without stenosis. Normal posterior communicating artery origins. Patent carotid termini, MCA and ACA origins. Dominant left A1 segment. Normal anterior communicating artery. Bilateral ACA branches are within normal limits except for abrupt termination of a small distal ACA branch or possibly venous branch contamination series 12, image 22. Left MCA M1 segment is tortuous. Left MCA trifurcation is patent without stenosis. Right MCA M1 segment is tortuous. Right MCA bifurcation is patent without stenosis. Bilateral MCA branches are within normal limits. Venous sinuses: Patent. Anatomic variants: Mildly dominant left vertebral artery. Fetal type PCA origins. Mildly dominant left ACA A1. Review of the MIP images confirms the above findings IMPRESSION: 1. Negative CT Perfusion. And CTA is negative for large vessel occlusion. But there is a questionable distal ACA occlusion (series 12, image 22, probably A4), but might instead be venous artifact. 2. Otherwise generally mild for age atherosclerosis in the head and neck. No other hemodynamically significant arterial stenosis. 3.  Aortic  Atherosclerosis (ICD10-I70.0). Study discussed by telephone with Dr. Erick Blinks on 05/24/2023 at 11:41 . Electronically Signed   By: Odessa Fleming M.D.   On: 05/24/2023 11:41   CT HEAD CODE STROKE WO CONTRAST`  Result Date: 05/24/2023 CLINICAL DATA:  Code stroke. EXAM: CT HEAD WITHOUT CONTRAST TECHNIQUE: Contiguous axial images were obtained from the base of the skull through the vertex without intravenous contrast. RADIATION DOSE REDUCTION: This exam was performed according to the departmental dose-optimization program which includes automated exposure control, adjustment of the mA and/or kV according to patient size and/or use of iterative reconstruction technique. COMPARISON:  None Available. FINDINGS: Brain: No evidence of acute large vascular territory infarction, hemorrhage, hydrocephalus, extra-axial collection or mass lesion/mass effect. Vascular: No hyperdense vessel. Skull: No acute fracture. Sinuses/Orbits: Clear sinuses.  No acute orbital findings. ASPECTS [facility] Stroke Program Early CT Score) total score (0-10 with 10 being normal): 10. IMPRESSION: 1. No evidence of acute large vascular territory infarct or acute hemorrhage. 2. ASPECTS is 10. Code stroke imaging results were communicated on 05/24/2023 at 11:18 am to provider Dr. Derry Lory via secure text paging. Electronically Signed   By: Feliberto Harts M.D.   On: 05/24/2023 11:18    Pending Labs Unresulted Labs (From admission, onward)     Start     Ordered   05/25/23 0500  HIV Antibody (routine testing w rflx)  (HIV Antibody (Routine testing w reflex) panel)  Tomorrow morning,   R        05/24/23 1257   05/24/23 1256  Comprehensive metabolic panel  Once,   STAT        05/24/23 1256   05/24/23 1256  Lipid panel  (Labs)  Add-on,   AD       Comments: Fasting    05/24/23 1257   05/24/23 1256  Hemoglobin A1c  (Labs)  Add-on,   AD       Comments: To assess prior glycemic control    05/24/23 1257   05/24/23 1201  CBC  Add-on,    AD        05/24/23 1200   05/24/23 1201  Comprehensive metabolic panel  Add-on,   AD        05/24/23 1200   05/24/23 1157  Urinalysis, Routine w reflex microscopic -Urine, Clean Catch  Once,   URGENT       Question:  Specimen Source  Answer:  Urine, Clean Catch   05/24/23 1156  Vitals/Pain Today's Vitals   05/24/23 1100 05/24/23 1132 05/24/23 1135 05/24/23 1245  BP:   113/85 122/78  Pulse:   72 76  Resp:   12 19  Temp:   98.2 F (36.8 C)   TempSrc:   Oral   SpO2:   99% 100%  Weight: 95.8 kg     Height: 6\' 1"  (1.854 m) 6\' 1"  (1.854 m)    PainSc:  0-No pain      Isolation Precautions No active isolations  Medications Medications   stroke: early stages of recovery book (has no administration in time range)  acetaminophen (TYLENOL) tablet 650 mg (has no administration in time range)    Or  acetaminophen (TYLENOL) 160 MG/5ML solution 650 mg (has no administration in time range)    Or  acetaminophen (TYLENOL) suppository 650 mg (has no administration in time range)  senna-docusate (Senokot-S) tablet 1 tablet (has no administration in time range)  heparin injection 5,000 Units (has no administration in time range)  atorvastatin (LIPITOR) tablet 10 mg (has no administration in time range)  digoxin (LANOXIN) tablet 125 mcg (has no administration in time range)  empagliflozin (JARDIANCE) tablet 10 mg (has no administration in time range)  furosemide (LASIX) tablet 40 mg (has no administration in time range)  aspirin EC tablet 81 mg (has no administration in time range)  iohexol (OMNIPAQUE) 350 MG/ML injection 100 mL (100 mLs Intravenous Contrast Given 05/24/23 1127)  clopidogrel (PLAVIX) tablet 75 mg (75 mg Oral Given 05/24/23 1243)    Mobility walks     Focused Assessments Neuro Assessment Handoff:  Swallow screen pass? Yes    NIH Stroke Scale  Dizziness Present: No Headache Present: No Interval: Initial Level of Consciousness (1a.)   : Alert, keenly  responsive LOC Questions (1b. )   : Answers both questions correctly LOC Commands (1c. )   : Performs both tasks correctly Best Gaze (2. )  : Normal Visual (3. )  : No visual loss Facial Palsy (4. )    : Normal symmetrical movements Motor Arm, Left (5a. )   : No drift Motor Arm, Right (5b. ) : No drift Motor Leg, Left (6a. )  : Drift Motor Leg, Right (6b. ) : No drift Limb Ataxia (7. ): Absent Sensory (8. )  : Normal, no sensory loss Best Language (9. )  : No aphasia Dysarthria (10. ): Normal Extinction/Inattention (11.)   : No Abnormality Complete NIHSS TOTAL: 1 Last date known well: 05/23/23 Last time known well: 1200 Neuro Assessment: Exceptions to WDL Neuro Checks:   Initial (05/24/23 1100)  Has TPA been given? No If patient is a Neuro Trauma and patient is going to OR before floor call report to 4N Charge nurse: [phone] or [phone]   R Recommendations: See Admitting Provider Note  Report given to:   Additional Notes:

## 2023-05-24 NOTE — Code Documentation (Addendum)
Stroke Response Nurse Documentation Code Documentation  Lynn Estrada is a 62 y.o. female arriving to [facility]  via [facility] on 05/24/2023 with past medical hx of HTN, COPD, CAD, HLD. On aspirin 81 mg daily. Code stroke was activated by EMS.   Patient from home where she was LKW yesterday at 1200 and now complaining of left sided weakness, left hemianopia, and aphasia. Patient recently went on a cruise. Yesterday at noon she began feeling like she was swaying, like she was still on the cruise ship. She endorses having noted slurred speech today.   Stroke team at the bedside on patient arrival. Labs drawn and patient cleared for CT by Dr. Particia Nearing. Patient to CT with team. NIHSS 2, see documentation for details and code stroke times. Patient with left leg weakness on exam.   The following imaging was completed:  CT Head, CTA, and CTP.   Patient is not a candidate for IV Thrombolytic due to LKW yesterday at noon. Patient is not a candidate for IR due to NIH 2.   Care Plan: q2h NIHSS and VS.   Bedside handoff with ED RN Madelynn.    Jakeob Tullis L Serapio Edelson  Rapid Response RN

## 2023-05-24 NOTE — Progress Notes (Signed)
Patient went off floor to MRI with RN escort.

## 2023-05-24 NOTE — Consult Note (Signed)
NEUROLOGY CONSULT NOTE   Date of service: May 24, 2023 Patient Name: Lynn Estrada MRN:  [id number] DOB:  07-30-1960 Chief Complaint: "CODE STROKE" Requesting Provider: Reymundo Poll, MD  History of Present Illness  Lynn Estrada is a 62 y.o. female with past medical history significant for:. MI, hypertension, CHF, COPD, diabetes, CAD, hyperlipidemia, GERD, kidney stones who was brought in as a code stroke d/t left-sided weakness, left hemianopia, aphasia, dysarthria.  Per EMS, patient was told to take 4 baby aspirin's after they were called for shortness of breath and dizziness as they thought her symptoms were attributed to her pacemaker.  Symptoms of left-sided weakness left hemianopia and dysarthria appeared en route and continued to evolve with EMS, but were not present when they first arrived. On exam at bridge, patient has some left leg weakness and some slight dysarthria.  Patient was taken emergently to CT.  CT head and CTA CTP negative with questionable distal ACA occlusion. Per chart review, patient called [facility] around 10 AM stating she was having strokelike symptoms difficulty finding words, blurry vision and changes in balance.  She was advised to go to the ED.   LKW: 1200 11/21 Modified rankin score: 0-Completely asymptomatic and back to baseline post- stroke IV Thrombolysis: No (low NIH) EVT:  No (no LVO) NIHSS: 2   ROS  Comprehensive ROS performed and pertinent positives documented in HPI   Past History   Past Medical History:  Diagnosis Date   Anemia    Arthritis    CHF (congestive heart failure) (HCC) 12/06/2022   EF is 20% to 25 %   COPD (chronic obstructive pulmonary disease) (HCC)    Diabetes mellitus without complication (HCC)    Eczema    GERD (gastroesophageal reflux disease)    History of adenomatous polyp of colon    08-03-2016  tubular adenoma x2 and hyperplastic   History of chronic gastritis 08/03/2016   History of  ectopic pregnancy 1988   s/p  left salpingectomy   History of endometriosis    History of kidney stones    History of partial nephrectomy    right pelvis for very large stone   History of pneumonia 07/02/2016   CAP   History of sepsis    07-01-2016  sepsis w/ pyelonephritis, CAP /   12-31-2016  urosepsis w/ kidney stone obstruction   History of uterine leiomyoma    Hx of adenomatous colonic polyps 08/03/2016   2 adenomas, 1 hpp and 1 lost polyp 2018 all < 1 cm    Hyperlipidemia    Hypertension    Left ureteral stone    Myocardial infarction [facility])    Nephrolithiasis    left obstructive stone and right non-obstructive stone  per CT 12-31-2016   Urgency of urination     Past Surgical History:  Procedure Laterality Date   ABDOMINAL HYSTERECTOMY  01/20/2007   w/ Lysis Adhesions/  Left salpingoophorectomy/  Right Salpingectomy   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   Procedure: BIV ICD INSERTION CRT-D;  Surgeon: Maurice Small, MD;  Location: [facility];  Service: Cardiovascular;  Laterality: N/A;   CHOLECYSTECTOMY  1994   and Right Partial Nephrectomy (pelvis for very large stone)   COLONOSCOPY WITH PROPOFOL N/A 03/26/2023   Procedure: COLONOSCOPY WITH PROPOFOL;  Surgeon: Iva Boop, MD;  Location: [facility];  Service: Gastroenterology;  Laterality: N/A;   CYSTOSCOPY W/ URETERAL STENT PLACEMENT Left 12/31/2016   Procedure: CYSTOSCOPY WITH  RETROGRADE PYELOGRAM/ LEFT URETERAL STENT PLACEMENT;  Surgeon: Sebastian Ache, MD;  Location: [facility];  Service: Urology;  Laterality: Left;   CYSTOSCOPY WITH RETROGRADE PYELOGRAM, URETEROSCOPY AND STENT PLACEMENT Left 01/18/2017   Procedure: 1ST STAGE CYSTOSCOPY WITH RETROGRADE PYELOGRAM, URETEROSCOPY AND STENT REPLACEMENT;  Surgeon: Sebastian Ache, MD;  Location: [facility];  Service: Urology;  Laterality: Left;   CYSTOSCOPY WITH RETROGRADE PYELOGRAM, URETEROSCOPY AND STENT PLACEMENT Left 02/01/2017   Procedure: 2ND  STAGE CYSTOSCOPY WITH RETROGRADE PYELOGRAM, URETEROSCOPY AND STENT REPLACEMENT;  Surgeon: Sebastian Ache, MD;  Location: [facility];  Service: Urology;  Laterality: Left;   ECTOPIC PREGNANCY SURGERY  1988   Left Salpingectomy   HOLMIUM LASER APPLICATION Left 01/18/2017   Procedure: HOLMIUM LASER APPLICATION;  Surgeon: Sebastian Ache, MD;  Location: [facility];  Service: Urology;  Laterality: Left;   HOLMIUM LASER APPLICATION Left 02/01/2017   Procedure: HOLMIUM LASER APPLICATION;  Surgeon: Sebastian Ache, MD;  Location: [facility];  Service: Urology;  Laterality: Left;   LUMBAR DISC SURGERY  01/1999   right L5 -- S1   POLYPECTOMY  03/26/2023   Procedure: POLYPECTOMY;  Surgeon: Iva Boop, MD;  Location: [facility];  Service: Gastroenterology;;   RE-EXPLORATION LUMBAR/  LAMINECTOMY AND MICRODISKECTOMY  10/14/2001   right L5 -- S1   RIGHT HEART CATH N/A 05/23/2022   Procedure: RIGHT HEART CATH;  Surgeon: Dolores Patty, MD;  Location: [facility];  Service: Cardiovascular;  Laterality: N/A;   RIGHT/LEFT HEART CATH AND CORONARY ANGIOGRAPHY N/A 07/18/2021   Procedure: RIGHT/LEFT HEART CATH AND CORONARY ANGIOGRAPHY;  Surgeon: Dolores Patty, MD;  Location: [facility];  Service: Cardiovascular;  Laterality: N/A;   TUBAL LIGATION Bilateral 10/17/1999   PPTL   UMBILICAL HERNIA REPAIR  age 89    Family History: Family History  Problem Relation Age of Onset   Sarcoidosis Mother    Prostate cancer Father    Hypertension Sister    Eczema Sister    Lupus Sister    Breast cancer Maternal Aunt    Gout Maternal Grandmother    Diabetes Maternal Grandmother        leg amputations   Stomach cancer Neg Hx    Colon cancer Neg Hx    Esophageal cancer Neg Hx    Rectal cancer Neg Hx     Social History  reports that she quit smoking about 26 years ago. Her smoking use included cigarettes. She started smoking about 31 years  ago. She has a 3.8 pack-year smoking history. She has never used smokeless tobacco. She reports that she does not currently use alcohol. She reports that she does not use drugs.  Allergies  Allergen Reactions   Latex Rash and Hives   Bactrim [Sulfamethoxazole-Trimethoprim] Rash   Carvedilol Itching   Metoprolol Itching and Rash    Medications   Current Facility-Administered Medications:    [START ON 05/25/2023]  stroke: early stages of recovery book, , Does not apply, Once, Masters, Katie, DO   acetaminophen (TYLENOL) tablet 650 mg, 650 mg, Oral, Q4H PRN **OR** acetaminophen (TYLENOL) 160 MG/5ML solution 650 mg, 650 mg, Per Tube, Q4H PRN **OR** acetaminophen (TYLENOL) suppository 650 mg, 650 mg, Rectal, Q4H PRN, Masters, Katie, DO   [START ON 05/25/2023] aspirin EC tablet 81 mg, 81 mg, Oral, Q lunch, Masters, Katie, DO   atorvastatin (LIPITOR) tablet 10 mg, 10 mg, Oral, Daily, Masters, Katie, DO   digoxin (LANOXIN) tablet 125 mcg,  125 mcg, Oral, Daily, Masters, Katie, DO   [START ON 05/25/2023] empagliflozin (JARDIANCE) tablet 10 mg, 10 mg, Oral, Daily, Masters, Katie, DO   [START ON 05/25/2023] furosemide (LASIX) tablet 40 mg, 40 mg, Oral, Daily, Masters, Katie, DO   heparin injection 5,000 Units, 5,000 Units, Subcutaneous, Q8H, Masters, Katie, DO   senna-docusate (Senokot-S) tablet 1 tablet, 1 tablet, Oral, QHS PRN, Masters, Florentina Addison, DO  Current Outpatient Medications:    acetaminophen (TYLENOL) 500 MG tablet, Take 500-1,000 mg by mouth every 6 (six) hours as needed (for pain.)., Disp: , Rfl:    aspirin 81 MG EC tablet, Take 81 mg by mouth daily with lunch., Disp: , Rfl:    atorvastatin (LIPITOR) 10 MG tablet, Take 10 mg by mouth daily., Disp: , Rfl:    digoxin (LANOXIN) 0.125 MG tablet, TAKE 1 TABLET BY MOUTH EVERY DAY, Disp: 90 tablet, Rfl: 3   empagliflozin (JARDIANCE) 10 MG TABS tablet, Take 1 tablet (10 mg total) by mouth daily., Disp: 30 tablet, Rfl: 11   furosemide (LASIX) 40 MG  tablet, Take 40 mg by mouth daily., Disp: , Rfl:    Multiple Vitamin (MULTIVITAMIN WITH MINERALS) TABS tablet, Take 1 tablet by mouth daily with lunch. One A Day for Women 50+, Disp: , Rfl:    sacubitril-valsartan (ENTRESTO) 24-26 MG, Take 1 tablet by mouth 2 (two) times daily., Disp: 60 tablet, Rfl: 11   spironolactone (ALDACTONE) 25 MG tablet, TAKE 1 TABLET (25 MG TOTAL) BY MOUTH DAILY., Disp: 90 tablet, Rfl: 2  Vitals   Vitals:   06-21-2023 1100 06/21/2023 1132 06/21/2023 1135 2023/06/21 1245  BP:   113/85 122/78  Pulse:   72 76  Resp:   12 19  Temp:   98.2 F (36.8 C)   TempSrc:   Oral   SpO2:   99% 100%  Weight: 95.8 kg     Height: 6\' 1"  (1.854 m) 6\' 1"  (1.854 m)      Body mass index is 27.86 kg/m.  Physical Exam   Constitutional: Appears well-developed and well-nourished.  HHENT: [place]/AT Cardiovascular: Normal rate and regular rhythm. Pacemaker present. Respiratory: Effort normal, non-labored breathing.   Neurologic Examination   Neuro: Mental Status: Patient is awake, alert, oriented to person, place, month, year, and situation. Patient is able to give a clear and coherent history. No signs of aphasia or neglect. Slight dysarthria present.  Cranial Nerves: II: Visual Fields are full. Pupils are equal, round, and reactive to light.   III,IV, VI: EOMI without ptosis or diploplia.  V: Facial sensation is symmetric to temperature VII: Facial movement is symmetric.  VIII: hearing is intact to voice X: Uvula elevates symmetrically XI: Shoulder shrug is symmetric. XII: tongue is midline without atrophy or fasciculations.  Motor: Tone is normal. Bulk is normal.   LLE: 4-/5 with slight drift.  Sensory: Sensation is symmetric to light touch in the arms and legs. Cerebellar: FNF and HKS are intact bilaterally   Labs/Imaging/Neurodiagnostic studies   CBC:  Recent Labs  Lab 06/21/2023 1108 2023/06/21 1157  WBC  --  6.3  HGB 12.2 11.9*  HCT 36.0 37.5  MCV  --  90.4  PLT   --  246   Basic Metabolic Panel:  Lab Results  Component Value Date   NA 140 2023-06-21   K 4.1 06-21-2023   CO2 26 12/21/2022   GLUCOSE 155 (H) 06-21-23   BUN 19 06/21/23   CREATININE 1.10 (H) 2023/06/21   CALCIUM 9.4 12/21/2022  GFRNONAA 55 (L) 12/06/2022   GFRAA 55 (L) 06/29/2019   Lipid Panel:  Lab Results  Component Value Date   LDLCALC 70 07/14/2021   HgbA1c:  Lab Results  Component Value Date   HGBA1C 6.2 (H) 07/14/2021   Urine Drug Screen: No results found for: "LABOPIA", "COCAINSCRNUR", "LABBENZ", "AMPHETMU", "THCU", "LABBARB"  Alcohol Level No results found for: "ETH" INR  Lab Results  Component Value Date   INR 1.27 07/02/2016   APTT  Lab Results  Component Value Date   APTT 36 07/02/2016   AED levels: No results found for: "PHENYTOIN", "ZONISAMIDE", "LAMOTRIGINE", "LEVETIRACETA"  CT Head without contrast(Personally reviewed): No evidence of acute large vascular territory infarct or acute hemorrhage. 2. ASPECTS is 10.  CT angio Head and Neck with contrast with perfusion(Personally reviewed): No LVO Questionable distal ACA occlusion (versus venous artifact) Negative CT perfusion  MRI Brain(Personally reviewed): PENDING   ASSESSMENT   Ceylin Stieglitz Adeyemi is a 62 y.o. female with past medical history significant for:. MI, hypertension, CHF, COPD, diabetes, CAD, hyperlipidemia, GERD, kidney stones who was brought in as a code stroke d/t left-sided weakness, left hemianopia, aphasia, dysarthria. CT/CTA/CTP negative. Questionable ACA occlusion seen on CTA, which could explain the leg weakness. Recommend admitting for further stroke workup, including MRI.   RECOMMENDATIONS  - Frequent Neuro checks per stroke unit protocol - MRI Brain stroke protocol   Have confirmed that pacemaker tech is here today  - Vascular imaging - CTA completed - TTE - Lipid panel - Statin - will be started if LDL>70 or otherwise medically indicated - A1C -  Antithrombotic - ASA/Plavix - DVT ppx - lovenox - Smoking cessation - will counsel patient - SBP goal - <220, PRN labetalol if HR>60 and PRN Hydralazine if HR<60 - Telemetry monitoring for arrhythmia - 72h - Swallow screen - will be performed prior to PO intake - Stroke education - will be given - PT/OT/SLP - Dispo: admit for stroke workup  _______________________________________________________________   Pt seen by Neuro NP/APP and later by MD. Note/plan to be edited by MD as needed.    Lynnae January, DNP, AGACNP-BC [facility] Please use AMION for contact information & EPIC for messaging.   NEUROHOSPITALIST ADDENDUM Performed a face to face diagnostic evaluation.   I have reviewed the contents of history and physical exam as documented by PA/ARNP/Resident and agree with above documentation.  I have discussed and formulated the above plan as documented. Edits to the note have been made as needed.  Impression/Key exam findings/Plan: mild LLE weakness upon arrival to the ED. This is improved from reported L sided weakness, L hemianopsia and aphasia/dysarthria when EMS saw her. CT Head with no ICH or large stroke. CTA with ?distal ACA occlusion. CTP with no mismatch.  She was outside tnkase window and given no LVO, not a candidate for thrombectomy. Exam is highly concerning or a stroke. I would recommend medicine admission with stroke workup.  Erick Blinks, MD [facility] [phone]   If 7pm to 7am, please call on call as listed on AMION.

## 2023-05-24 NOTE — Telephone Encounter (Signed)
Pt states that she feels like she is having hiccups where the implant is and losing mobility on the left side. Please advise

## 2023-05-25 ENCOUNTER — Observation Stay (HOSPITAL_BASED_OUTPATIENT_CLINIC_OR_DEPARTMENT_OTHER): Payer: Medicaid Other

## 2023-05-25 DIAGNOSIS — R531 Weakness: Secondary | ICD-10-CM | POA: Diagnosis not present

## 2023-05-25 DIAGNOSIS — I351 Nonrheumatic aortic (valve) insufficiency: Secondary | ICD-10-CM

## 2023-05-25 DIAGNOSIS — R8281 Pyuria: Secondary | ICD-10-CM

## 2023-05-25 DIAGNOSIS — I69354 Hemiplegia and hemiparesis following cerebral infarction affecting left non-dominant side: Secondary | ICD-10-CM

## 2023-05-25 DIAGNOSIS — I639 Cerebral infarction, unspecified: Secondary | ICD-10-CM | POA: Diagnosis not present

## 2023-05-25 LAB — URINALYSIS, W/ REFLEX TO CULTURE (INFECTION SUSPECTED)
Bacteria, UA: NONE SEEN
Bilirubin Urine: NEGATIVE
Glucose, UA: >=500 mg/dL — AB
Hgb urine dipstick: NEGATIVE
Ketones, ur: NEGATIVE mg/dL
Nitrite: NEGATIVE
Protein, ur: NEGATIVE mg/dL
Specific Gravity, Urine: 1.021 (ref 1.005–1.030)
pH: 6 (ref 5.0–8.0)

## 2023-05-25 LAB — RAPID URINE DRUG SCREEN, HOSP PERFORMED
Amphetamines: NOT DETECTED
Barbiturates: NOT DETECTED
Benzodiazepines: NOT DETECTED
Cocaine: NOT DETECTED
Opiates: POSITIVE — AB
Tetrahydrocannabinol: NOT DETECTED

## 2023-05-25 LAB — HIV ANTIBODY (ROUTINE TESTING W REFLEX): HIV Screen 4th Generation wRfx: NONREACTIVE

## 2023-05-25 LAB — GLUCOSE, CAPILLARY
Glucose-Capillary: 153 mg/dL — ABNORMAL HIGH (ref 70–99)
Glucose-Capillary: 122 mg/dL — ABNORMAL HIGH (ref 70–99)
Glucose-Capillary: 100 mg/dL — ABNORMAL HIGH (ref 70–99)
Glucose-Capillary: 111 mg/dL — ABNORMAL HIGH (ref 70–99)

## 2023-05-25 LAB — ECHOCARDIOGRAM COMPLETE
AR max vel: 2.02 cm2
AV Area VTI: 2.04 cm2
AV Area mean vel: 1.85 cm2
AV Mean grad: 4 mm[Hg]
AV Peak grad: 8 mm[Hg]
Ao pk vel: 1.41 m/s
Area-P 1/2: 4.21 cm2
Height: 73 in
S' Lateral: 6.8 cm
Weight: 3467.39 [oz_av]

## 2023-05-25 MED ORDER — WHITE PETROLATUM EX OINT
TOPICAL_OINTMENT | CUTANEOUS | Status: DC | PRN
  Filled 2023-05-25: qty 28.35

## 2023-05-25 MED ORDER — NITROFURANTOIN MONOHYD MACRO 100 MG PO CAPS
100.0000 mg | ORAL_CAPSULE | Freq: Two times a day (BID) | ORAL | Status: DC
  Administered 2023-05-25 (×2): 100 mg via ORAL
  Filled 2023-05-25 (×3): qty 1

## 2023-05-25 MED ORDER — FLUCONAZOLE 150 MG PO TABS
150.0000 mg | ORAL_TABLET | Freq: Once | ORAL | Status: AC
  Administered 2023-05-25: 150 mg via ORAL
  Filled 2023-05-25: qty 1

## 2023-05-25 NOTE — Evaluation (Signed)
Physical Therapy Evaluation Patient Details Name: Lynn Estrada MRN: [id number] DOB: October 06, 1960 Today's Date: 05/25/2023  History of Present Illness  62 y/o F admitted 05/24/23 with Lt sided weakness and slurred speech which resolved. 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 PMH: HTN, gastritis, GERD, CHF, COPD, DM, CAD, HLD  Clinical Impression  Pt is presenting slightly below baseline level of functioning.   Pt is Mod I with sit to stand, gait and was able to use the toilet and get up/down off toilet without assistance and use of grab bar and RW. Pt previously was ambulating without AD. Due to pt PLOF, current level of functioning and 24/7 assistance at home from family recommending OPPT on discharge from acute care hospital setting in neuro OPPT clinic to address coordination and fluidity of movement to return to PLOF. Pt tolerated treatment session well and all needs can be met in the next venue of care at this time pt will be discharged from acute care skilled physical therapy services.       If plan is discharge home, recommend the following: Help with stairs or ramp for entrance     Equipment Recommendations None recommended by PT     Functional Status Assessment Patient has had a recent decline in their functional status and demonstrates the ability to make significant improvements in function in a reasonable and predictable amount of time.     Precautions / Restrictions Precautions Precautions: Fall Restrictions Weight Bearing Restrictions: No      Mobility  Bed Mobility   General bed mobility comments: EOB on arrival and departure    Transfers Overall transfer level: Modified independent Equipment used: Rolling walker (2 wheels) Transfers: Sit to/from Stand Sit to Stand: Modified independent (Device/Increase time)           General transfer comment: Good technique no LOB from EOB and toilet.     Ambulation/Gait Ambulation/Gait assistance: Modified independent (Device/Increase time) Gait Distance (Feet): 50 Feet Assistive device: Rolling walker (2 wheels) Gait Pattern/deviations: Step-through pattern, Trunk flexed Gait velocity: decreased Gait velocity interpretation: <1.8 ft/sec, indicate of risk for recurrent falls   General Gait Details: slow with decreased knee flexion bil and flat foot initial contact.   Modified Rankin (Stroke Patients Only) Modified Rankin (Stroke Patients Only) Pre-Morbid Rankin Score: No significant disability Modified Rankin: Slight disability     Balance Overall balance assessment: Mild deficits observed, not formally tested Sitting-balance support: No upper extremity supported, Feet supported Sitting balance-Leahy Scale: Good     Standing balance support: Bilateral upper extremity supported, During functional activity, No upper extremity supported Standing balance-Leahy Scale: Fair Standing balance comment: no LOB pt was able to stand and wash hands without UE support and wide BOS.         Pertinent Vitals/Pain Pain Assessment Pain Assessment: No/denies pain    Home Living Family/patient expects to be discharged to:: Private residence Living Arrangements: Spouse/significant other;Children (daughter, 57) Available Help at Discharge: Family;Available 24 hours/day Type of Home: House Home Access: Other (comment) (threshold)       Home Layout: One level Home Equipment: Agricultural consultant (2 wheels);Cane - single point;Crutches      Prior Function Prior Level of Function : Independent/Modified Independent;Driving               ADLs Comments: No assist at baseline for ADL or IADL     Extremity/Trunk Assessment   Upper Extremity Assessment Upper Extremity Assessment:  Defer to OT evaluation    Lower Extremity Assessment Lower Extremity Assessment: Overall WFL for tasks assessed    Cervical / Trunk Assessment Cervical /  Trunk Assessment: Normal  Communication   Communication Communication: No apparent difficulties  Cognition Arousal: Alert Behavior During Therapy: WFL for tasks assessed/performed Overall Cognitive Status: Within Functional Limits for tasks assessed      General Comments General comments (skin integrity, edema, etc.): Pt reports no dizziness or lightheadedness. Pt was concerned the toilet hadn't been cleaned and wiped toilet down. There ws a hat in the toilet indicating nursing staff was measureing output which pt stated they had been measureing most likely indicating that she had used that toilet prior to my arrival.        Assessment/Plan    PT Assessment All further PT needs can be met in the next venue of care  PT Problem List Decreased mobility;Decreased balance       PT Treatment Interventions DME instruction;Therapeutic exercise;Gait training;Balance training;Stair training;Functional mobility training;Therapeutic activities;Patient/family education    PT Goals (Current goals can be found in the Care Plan section)  Acute Rehab PT Goals PT Goal Formulation: All assessment and education complete, DC therapy    Frequency Min 1X/week        AM-PAC PT "6 Clicks" Mobility  Outcome Measure Help needed turning from your back to your side while in a flat bed without using bedrails?: None Help needed moving from lying on your back to sitting on the side of a flat bed without using bedrails?: None Help needed moving to and from a bed to a chair (including a wheelchair)?: None Help needed standing up from a chair using your arms (e.g., wheelchair or bedside chair)?: None Help needed to walk in hospital room?: None Help needed climbing 3-5 steps with a railing? : A Ezzell 6 Click Score: 23    End of Session Equipment Utilized During Treatment: Gait belt Activity Tolerance: Patient tolerated treatment well Patient left: in bed;with call bell/phone within reach Nurse  Communication: Mobility status PT Visit Diagnosis: Unsteadiness on feet (R26.81)    Time: 9562-1308 PT Time Calculation (min) (ACUTE ONLY): 17 min   Charges:   PT Evaluation $PT Eval Low Complexity: 1 Low   PT General Charges $$ ACUTE PT VISIT: 1 Visit        Lynn Estrada, DPT, CLT  Acute Rehabilitation Services Office: [phone] (Secure chat preferred)   Claudia Desanctis 05/25/2023, 3:47 PM

## 2023-05-25 NOTE — Evaluation (Signed)
Occupational Therapy Evaluation Patient Details Name: Lynn Estrada MRN: [id number] DOB: Sep 30, 1960 Today's Date: 05/25/2023   History of Present Illness Pt is a 62 y/o F admitted on 05/24/23 after presenting with c/o L sided weakness that began yesterday as well as slurred speech this AM that has resolved. 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 PMH: HTN, gastritis, GERD, CHF, COPD, DM, CAD, HLD   Clinical Impression   PTA, pt lived with family and was independent in ADL, IADL, and driving. Upon eval, pt presents with weakness (L>R), decreased coordination, vision, and balance. Pt needing CGA for ADL at this time. Pt reports sister/mother can stay with her during the day at home. Recommending continued OT at OP OT at discharge.       If plan is discharge home, recommend the following: A Koval help with walking and/or transfers;A Cupples help with bathing/dressing/bathroom;Assistance with cooking/housework;Help with stairs or ramp for entrance;Assist for transportation    Functional Status Assessment  Patient has had a recent decline in their functional status and demonstrates the ability to make significant improvements in function in a reasonable and predictable amount of time.  Equipment Recommendations  BSC/3in1    Recommendations for Other Services       Precautions / Restrictions Precautions Precautions: Fall Restrictions Weight Bearing Restrictions: No      Mobility Bed Mobility               General bed mobility comments: EOB on arrival and departure    Transfers Overall transfer level: Needs assistance Equipment used: Rolling walker (2 wheels) Transfers: Sit to/from Stand Sit to Stand: Contact guard assist           General transfer comment: for safety; slow to rise but good technique. Approaching supervision by end of session      Balance Overall balance assessment: Needs  assistance Sitting-balance support: No upper extremity supported, Feet supported Sitting balance-Leahy Scale: Good     Standing balance support: Bilateral upper extremity supported, During functional activity Standing balance-Leahy Scale: Poor Standing balance comment: reliant on RW dynamically and at least one UE statically                           ADL either performed or assessed with clinical judgement   ADL Overall ADL's : Needs assistance/impaired Eating/Feeding: Sitting;Modified independent Eating/Feeding Details (indicate cue type and reason): EOB eating omlette Grooming: Set up;Sitting   Upper Body Bathing: Set up;Sitting   Lower Body Bathing: Contact guard assist;Sit to/from stand   Upper Body Dressing : Set up;Sitting   Lower Body Dressing: Contact guard assist;Sit to/from stand Lower Body Dressing Details (indicate cue type and reason): to doff sweatmants and don new underpants. Toilet Transfer: Contact guard assist;Ambulation;Rolling walker (2 wheels)   Toileting- Clothing Manipulation and Hygiene: Contact guard assist;Sit to/from stand;Set up Toileting - Clothing Manipulation Details (indicate cue type and reason): for safety, slow to rise     Functional mobility during ADLs: Contact guard assist;Rolling walker (2 wheels)       Vision Baseline Vision/History: 1 Wears glasses Patient Visual Report: Other (comment) (dizziness with visual testing. Unclear if true change in vision, but blatantly uncomfortable) Vision Assessment?: Yes Eye Alignment: Within Functional Limits Ocular Range of Motion: Within Functional Limits Alignment/Gaze Preference: Within Defined Limits Tracking/Visual Pursuits: Decreased smoothness of eye movement to LEFT superior field;Impaired - to be further tested in functional  context Convergence: Impaired - to be further tested in functional context Visual Fields: Other (comment);Impaired-to be further tested in functional  context (Pt able to report how many fingers therapist holding up in periphery with incresaed time) Depth Perception: Undershoots (significantly incresaed time)     Perception         Praxis         Pertinent Vitals/Pain Pain Assessment Pain Assessment: No/denies pain     Extremity/Trunk Assessment Upper Extremity Assessment Upper Extremity Assessment: LUE deficits/detail;Right hand dominant;Generalized weakness RUE Deficits / Details: 4-/5 gross strength LUE Deficits / Details: Slowed coordination as compared to R; 3+/5 strength           Communication Communication Communication: No apparent difficulties   Cognition Arousal: Alert Behavior During Therapy: WFL for tasks assessed/performed Overall Cognitive Status: Within Functional Limits for tasks assessed                                 General Comments: questionable difficulty reporting whether current deficits are new or baseline     General Comments  Dizziness after visual assessment that did not subside. BP 105/68 (78) HR 92 seated; 89/68 (72) HR 81 stading and pt requesting sit, 112/70 (83) HR 98 seated.    Exercises     Shoulder Instructions      Home Living Family/patient expects to be discharged to:: Private residence Living Arrangements: Spouse/significant other;Children (daughter, 54) Available Help at Discharge: Family Type of Home: House Home Access: Other (comment) (threshold)     Home Layout: One level     Bathroom Shower/Tub: Chief Strategy Officer:  (has both) Bathroom Accessibility: Yes How Accessible: Accessible via walker;Accessible via wheelchair Home Equipment: Rolling Walker (2 wheels);Cane - single point;Crutches          Prior Functioning/Environment Prior Level of Function : Independent/Modified Independent;Driving               ADLs Comments: No assist at baseline for ADL or IADL        OT Problem List: Decreased strength;Decreased  activity tolerance;Impaired balance (sitting and/or standing);Decreased coordination;Impaired vision/perception;Decreased knowledge of use of DME or AE      OT Treatment/Interventions: Self-care/ADL training;Therapeutic exercise;DME and/or AE instruction;Balance training;Patient/family education;Therapeutic activities    OT Goals(Current goals can be found in the care plan section) Acute Rehab OT Goals Patient Stated Goal: get better OT Goal Formulation: With patient Time For Goal Achievement: 06/08/23 Potential to Achieve Goals: Good  OT Frequency: Min 1X/week    Co-evaluation              AM-PAC OT "6 Clicks" Daily Activity     Outcome Measure Help from another person eating meals?: None Help from another person taking care of personal grooming?: A Arterberry Help from another person toileting, which includes using toliet, bedpan, or urinal?: A Stenner Help from another person bathing (including washing, rinsing, drying)?: A Crate Help from another person to put on and taking off regular upper body clothing?: A Charlet Help from another person to put on and taking off regular lower body clothing?: A Mcgowan 6 Click Score: 19   End of Session Equipment Utilized During Treatment: Gait belt;Rolling walker (2 wheels) Nurse Communication: Mobility status;Other (comment) (orthostatic slightly)  Activity Tolerance: Patient tolerated treatment well;Other (comment) (limited initially by dizziness) Patient left: in bed;with call bell/phone within reach  OT Visit Diagnosis: Unsteadiness on feet (R26.81);Muscle weakness (generalized) (  M62.81);Low vision, both eyes (H54.2)                Time: 8295-6213 OT Time Calculation (min): 46 min Charges:  OT General Charges $OT Visit: 1 Visit OT Evaluation $OT Eval Moderate Complexity: 1 Mod OT Treatments $Self Care/Home Management : 23-37 mins  Tyler Deis, OTR/L [facility] Acute Rehabilitation Office: [phone]   Myrla Halsted 05/25/2023, 9:52 AM

## 2023-05-25 NOTE — Progress Notes (Addendum)
STROKE TEAM PROGRESS NOTE   BRIEF HPI Ms. Lynn Estrada is a 62 y.o. female with history of MI, hypertension, CHF, COPD, diabetes, CAD, hyperlipidemia, GERD and kidney stones presenting with left-sided weakness, left hemianopia, aphasia and dysarthria.  Symptoms improved before arrival, and patient had residual leg weakness and slight dysarthria.  CTP was negative, but CTA showed questionable distal ACA occlusion.  Patient states she is doing better now but that her speech is not 100% back to normal.  MRI demonstrates small acute infarct in cortex of posterior right cingulate gyrus.  NIH on Admission 2  INTERIM HISTORY/SUBJECTIVE Patient has been hemodynamically stable overnight and reports that her symptoms have improved greatly.  AICD was interrogated yesterday, and no atrial fibrillation was found.   OBJECTIVE  CBC    Component Value Date/Time   WBC 5.6 05/24/2023 1743   RBC 4.18 05/24/2023 1743   HGB 12.1 05/24/2023 1743   HGB 12.0 12/21/2022 0908   HCT 37.1 05/24/2023 1743   HCT 37.1 12/21/2022 0908   PLT 253 05/24/2023 1743   PLT 258 12/21/2022 0908   MCV 88.8 05/24/2023 1743   MCV 87 12/21/2022 0908   MCH 28.9 05/24/2023 1743   MCHC 32.6 05/24/2023 1743   RDW 13.7 05/24/2023 1743   RDW 12.8 12/21/2022 0908   LYMPHSABS 1.7 12/21/2022 0908   MONOABS 0.5 09/07/2022 0534   EOSABS 0.1 12/21/2022 0908   BASOSABS 0.0 12/21/2022 0908    BMET    Component Value Date/Time   NA 139 05/24/2023 1256   NA 141 12/21/2022 0908   K 4.3 05/24/2023 1256   CL 104 05/24/2023 1256   CO2 26 05/24/2023 1256   GLUCOSE 125 (H) 05/24/2023 1256   BUN 16 05/24/2023 1256   BUN 20 12/21/2022 0908   CREATININE 1.11 (H) 05/24/2023 1256   CREATININE 0.76 12/07/2015 1239   CALCIUM 9.2 05/24/2023 1256   EGFR 50 (L) 12/21/2022 0908   GFRNONAA 57 (L) 05/24/2023 1256   GFRNONAA >89 12/07/2015 1239    IMAGING past 24 hours MR BRAIN WO CONTRAST  Result Date: 05/24/2023 CLINICAL DATA:   Neuro deficit, acute, stroke suspected. EXAM: MRI HEAD WITHOUT CONTRAST TECHNIQUE: Multiplanar, multiecho pulse sequences of the brain and surrounding structures were obtained without intravenous contrast. COMPARISON:  Head CT May 24, 2023. FINDINGS: Brain: Small focus of restricted diffusion in the cortex of the posterior right cingulate gyrus (ACA territory). No hemorrhage, hydrocephalus, extra-axial collection mass lesion. Tiny remote remote infarct in the right cerebellar hemisphere. Scattered foci of T2 hyperintensity within the white matter of the cerebral hemispheres, nonspecific. Vascular: Normal flow voids. Skull and upper cervical spine: Normal marrow signal. Sinuses/Orbits: Negative. Other: None. IMPRESSION: 1. Small acute infarct in the cortex of the posterior right cingulate gyrus (right ACA territory). 2. Tiny remote infarct in the right cerebellar hemisphere. 3. Scattered foci of T2 hyperintensity within the white matter of the cerebral hemispheres, nonspecific but may represent moderate chronic microvascular ischemic changes. Electronically Signed   By: Baldemar Lenis M.D.   On: 05/24/2023 17:08    Vitals:   05/25/23 0408 05/25/23 0409 05/25/23 0734 05/25/23 1114  BP: 99/74  102/74 105/69  Pulse: 80  82 73  Resp: 18  17 17   Temp: 98.5 F (36.9 C)  98.7 F (37.1 C) 98.5 F (36.9 C)  TempSrc: Oral  Oral Oral  SpO2: 99%  100% 96%  Weight:  98.3 kg    Height:  PHYSICAL EXAM General:  Alert, well-nourished, well-developed patient in no acute distress Psych:  Mood and affect appropriate for situation CV: Regular rate and rhythm on monitor Respiratory:  Regular, unlabored respirations on room air GI: Abdomen soft and nontender   NEURO:  Mental Status: AA&Ox3, patient is able to give clear and coherent history Speech/Language: speech is with very mild dysarthria per patient but no aphasia  Cranial Nerves:  II: PERRL. Visual fields full.  III, IV, VI:  EOMI. Eyelids elevate symmetrically.  V: Sensation is intact to light touch and symmetrical to face.  VII: Face is symmetrical resting and smiling VIII: hearing intact to voice. IX, X: Phonation is normal.  WU:[id number] shrug 5/5. XII: tongue is midline without fasciculations. Motor: 5/5 strength to all muscle groups tested.  Tone: is normal and bulk is normal Sensation- Intact to light touch bilaterally.   Coordination: FTN intact bilaterally.No drift.  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 ASPECTS 10.    CTA head & neck no LVO but questionable distal ACA A4 occlusion, difficult to determine which side CT perfusion negative for core infarct or penumbra MRI small acute infarct in the cortex of posterior right cingulate gyrus, tiny remote infarct in right cerebellar hemisphere 2D Echo EF 20-25%, improved from < 20% in 12/2022 AICD interrogation negative for atrial fibrillation LDL 60 HgbA1c 7.1 UDS positive for opiates VTE prophylaxis -subcutaneous heparin aspirin 81 mg daily prior to admission, now on aspirin 81 mg daily and clopidogrel 75 mg daily for 3 weeks and then Plavix alone. Therapy recommendations:  Outpatient PT/OT/ST Disposition: Pending   CHF Pacemaker in place EF 20-25%, improved from < 20% in 12/2022 AICD in place, no afib On DAPT, digoxin, jardiance, lasix Follow up with cardiology  Hypertension Home meds: Spironolactone 25 mg daily Stable Long term BP goal normotensive  Hyperlipidemia Home meds: Atorvastatin 10 mg daily, resumed in hospital LDL 60, goal < 70 High intensity statin not indicated as LDL below goal Continue statin at discharge  Diabetes type II Uncontrolled Home meds: Jardiance 10 mg daily HgbA1c 7.1, goal < 7.0 CBGs SSI Recommend close follow-up with PCP for better DM control  Other Stroke Risk  Factors CAD/MI  Other Active Problems   Hospital day # 0  Patient seen by NP and then by MD, MD to edit note as needed. Cortney E Ernestina Columbia , MSN, AGACNP-BC [facility] See Amion for schedule and pager information 05/25/2023 1:33 PM  ATTENDING NOTE: I reviewed above note and agree with the assessment and plan. Pt was seen and examined.   No family at the bedside. Pt lying in bed, no acute event overnight. Still complaining of left leg weakness but otherwise she felt she is pretty much back to her baseline. On exam, she has left LE proximal 4-/5, knee flexion 4/5 and distal 5/5. Sensation symmetrical. Otherwise no focal deficit.   Stroke likely due to right A4 occlusion with small right ACA infarct. Put on DAPT for 3 weeks and then plavix alone. EF 20-25% improved from 12/2022, continue GDMT meds per cardiology. AICD no afib so far. Continue statin, aggressive stroke risk factor modification. PT and OT recommend outpt.   For detailed assessment and plan, please refer to above/below as I have made changes wherever appropriate.   Neurology will sign off. Please call with questions. Pt will follow up with stroke clinic NP at  [facility] in about 4 weeks. Thanks for the consult.   Marvel Plan, MD PhD Stroke Neurology 05/25/2023 5:52 PM      To contact Stroke Continuity provider, please refer to [email or web address]. After hours, contact General Neurology

## 2023-05-25 NOTE — Progress Notes (Signed)
Echocardiogram 2D Echocardiogram has been performed.  Donnia Poplaski N Jahmar Mckelvy,RDCS 05/25/2023, 1:38 PM

## 2023-05-25 NOTE — Progress Notes (Addendum)
HD#0 SUBJECTIVE:  Patient Summary: Lynn Estrada is a 62 y.o. female with past medical history of HFrEF (EF <20% 07/24), LBBB with defibrillator in place, HTN, CAD presented to ED with left-sided numbness and tingling, slurring of speech that started around noon, admitted for stroke workup.  Overnight Events: None   Interim History: Patient is evaluated at bedside, reports overall doing well.  Patient denies any weakness, numbness, tingling, of her bilateral upper and lower extremities, denies any chest pain or shortness of breath.  Denies any dizziness or lightheadedness.  Reports she is tolerating food well.  Denies any nausea, vomiting, diarrhea.  Denies any abdominal pain.  Reports that she is unable to get up and go to the bathroom using the walker; however she does not use a walker at home.  She is reporting dysuria that started this morning, reports she has itching and burning when she urinates.  Otherwise no fever or chills.  Reports that she has a cough that has been ongoing, dry in nature.  Does not have any other questions at this time.  OBJECTIVE:  Vital Signs: Vitals:   05/24/23 2354 05/25/23 0408 05/25/23 0409 05/25/23 0734  BP: 109/69 99/74  102/74  Pulse: 82 80  82  Resp: 18 18  17   Temp: 99.1 F (37.3 C) 98.5 F (36.9 C)  98.7 F (37.1 C)  TempSrc: Oral Oral  Oral  SpO2: 100% 99%  100%  Weight:   98.3 kg   Height:       Supplemental O2: Room Air SpO2: 100 %  Filed Weights   05/24/23 1100 05/25/23 0409  Weight: 95.8 kg 98.3 kg     Intake/Output Summary (Last 24 hours) at 05/25/2023 1027 Last data filed at 05/25/2023 0000 Gross per 24 hour  Intake 835 ml  Output 1100 ml  Net -265 ml   Net IO Since Admission: -265 mL [05/25/23 0819]  Physical Exam: Physical Exam Constitutional: Laying in bed, in no acute distress HENT: normocephalic atraumatic, mucous membranes moist Eyes: conjunctiva non-erythematous, PERRLA, EOMI Cardiovascular: regular rate and  rhythm, no m/r/g, no LE edema bilaterally  Pulmonary/Chest: normal work of breathing on room air, lungs clear to auscultation bilaterally, no wheezing or crackles  Abdominal: soft, non-tender, non-distended, bowel sounds present  MSK: normal bulk and tone. 4/5 strength LLE, 4/5 L grip strength   Neurological: alert & oriented x 3, PERRLA, EOMI, CN II to XII intact, sensation intact bilaterally, 4 out of 5 strength left lower extremity, 4 out of 5 left grip strength.  Intact finger-to-nose test.  Skin: warm and dry Psych: normal mood and behavior  Patient Lines/Drains/Airways Status     Active Line/Drains/Airways     Name Placement date Placement time Site Days   Peripheral IV 05/24/23 18 G Right Antecubital 05/24/23  --  Antecubital  1   Ureteral Drain/Stent Left ureter 6 Fr. 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 Patient was evaluated at bedside by neurology.  CT head negative for large vascular territory infarct or hemorrhage; CT angio showed questionable distal ACA occlusion; MRI brain  showed small acute infarct in the cortex of the posterior right cingulate gyrus and tiny remote infarct in the right cerebellar hemisphere with chronic microvascular ischemic changes. She was started on Plavix 75 mg, Aspirin 10 mg, and Lipitor 10 mg. She passed bedside swallow exam. Per my evaluation today, she continues to have residual Left sided weakness that is apparent in the LUE and LLE, sensation intact, speech intact and coherent. Appreciate neurology recommendations; calculated ASCVD risk > 7.5% to <20%: A1c 7.1; LDL 60, Hx  HTN. Etiology currently unknown, thrombotic vs embolic. Echo pending.   Plan: - Follow up on Echo - Continue Aspirin 81 mg, Plavix 75 mg, and Lipitor 10 mg daily  - PT/OT evaluation pending  - Cardiac Telemetry  #HFrEF Cardiac catheterization in January 2023 showed minimal nonobstructive CAD.  cMRI completed at that time with some concerns for myocarditis, though unable to clearly present an etiology for HF. Appears euvolemic on exam, no JVD, no lower extremity edema bilaterally, no crackles on lung exam.  -Continue digoxin 125 CG, and for close in,  -Will continue to hold Entresto with soft blood pressures -Will hold lasix today, patient was orthostatics -Daily weight and strict I's and O's -Will continue to trend BMP  #Type 2 diabetes mellitus Newly diagnosed diabetes, last A1c per chart review was 6.2 on 07/2021, she is not on any diabetic medications except for which Jardiance for heart failure.  A1c checked at this visit is 7.1.  Patient is notified about her diabetes, we will plan on starting metformin upon discharge and a close follow-up with her PCP. -SSI  #Dysuria #UTI Patient reports that she is having some burning sensation when she urinates with itching, reports this started today.  UA checked today showed glucosuria and moderate leukocytes with WBC 11-20.  - UA w/ reflex pending  - Start Macrobid 100 mg BID for 5 days    Best Practice: Diet: Regular diet IVF: Fluids: None, Rate: None VTE: heparin injection 5,000 Units Start: 05/24/23 1400 Code: Full AB: None Therapy Recs: Pending, DME: walker Family Contact: Daughter, called and notified. DISPO: Anticipated discharge  1-2 days  to  pending  pending  medical management .  Signature: Jeral Pinch, D.O.  Internal Medicine Resident, PGY-1 [facility]  Pager: [phone] 8:19 AM, 05/25/2023   Please contact the on call pager after 5 pm and on weekends at [phone].

## 2023-05-26 DIAGNOSIS — I69354 Hemiplegia and hemiparesis following cerebral infarction affecting left non-dominant side: Secondary | ICD-10-CM | POA: Diagnosis not present

## 2023-05-26 DIAGNOSIS — I639 Cerebral infarction, unspecified: Secondary | ICD-10-CM

## 2023-05-26 LAB — BASIC METABOLIC PANEL
Anion gap: 8 (ref 5–15)
BUN: 18 mg/dL (ref 8–23)
CO2: 22 mmol/L (ref 22–32)
Calcium: 8.9 mg/dL (ref 8.9–10.3)
Chloride: 106 mmol/L (ref 98–111)
Creatinine, Ser: 1.13 mg/dL — ABNORMAL HIGH (ref 0.44–1.00)
GFR, Estimated: 55 mL/min — ABNORMAL LOW (ref >60)
Glucose, Bld: 119 mg/dL — ABNORMAL HIGH (ref 70–99)
Potassium: 4.1 mmol/L (ref 3.5–5.1)
Sodium: 136 mmol/L (ref 135–145)

## 2023-05-26 LAB — CBC
HCT: 34.2 % — ABNORMAL LOW (ref 36.0–46.0)
Hemoglobin: 10.9 g/dL — ABNORMAL LOW (ref 12.0–15.0)
MCH: 28.6 pg (ref 26.0–34.0)
MCHC: 31.9 g/dL (ref 30.0–36.0)
MCV: 89.8 fL (ref 80.0–100.0)
Platelets: 227 10*3/uL (ref 150–400)
RBC: 3.81 MIL/uL — ABNORMAL LOW (ref 3.87–5.11)
RDW: 13.7 % (ref 11.5–15.5)
WBC: 6.1 10*3/uL (ref 4.0–10.5)
nRBC: 0 % (ref 0.0–0.2)

## 2023-05-26 LAB — GLUCOSE, CAPILLARY
Glucose-Capillary: 148 mg/dL — ABNORMAL HIGH (ref 70–99)
Glucose-Capillary: 142 mg/dL — ABNORMAL HIGH (ref 70–99)
Glucose-Capillary: 154 mg/dL — ABNORMAL HIGH (ref 70–99)
Glucose-Capillary: 152 mg/dL — ABNORMAL HIGH (ref 70–99)

## 2023-05-26 MED ORDER — ALUM & MAG HYDROXIDE-SIMETH 200-200-20 MG/5ML PO SUSP
30.0000 mL | Freq: Once | ORAL | Status: AC
  Administered 2023-05-26: 30 mL via ORAL
  Filled 2023-05-26: qty 30

## 2023-05-26 MED ORDER — CEPHALEXIN 500 MG PO CAPS
500.0000 mg | ORAL_CAPSULE | Freq: Four times a day (QID) | ORAL | Status: DC
  Administered 2023-05-26 – 2023-05-27 (×2): 500 mg via ORAL
  Filled 2023-05-26 (×2): qty 1

## 2023-05-26 MED ORDER — DOCUSATE SODIUM 100 MG PO CAPS
100.0000 mg | ORAL_CAPSULE | Freq: Two times a day (BID) | ORAL | Status: DC
  Administered 2023-05-26 – 2023-05-27 (×3): 100 mg via ORAL
  Filled 2023-05-26 (×3): qty 1

## 2023-05-26 MED ORDER — LIDOCAINE VISCOUS HCL 2 % MT SOLN
15.0000 mL | Freq: Once | OROMUCOSAL | Status: AC
  Administered 2023-05-26: 15 mL via OROMUCOSAL
  Filled 2023-05-26: qty 15

## 2023-05-26 MED ORDER — SODIUM CHLORIDE 0.9 % IV SOLN
2.0000 g | INTRAVENOUS | Status: DC
  Administered 2023-05-26: 2 g via INTRAVENOUS
  Filled 2023-05-26: qty 20

## 2023-05-26 MED ORDER — CALCIUM CARBONATE ANTACID 500 MG PO CHEW
1.0000 | CHEWABLE_TABLET | Freq: Three times a day (TID) | ORAL | Status: DC | PRN
  Administered 2023-05-26: 200 mg via ORAL
  Filled 2023-05-26 (×2): qty 1

## 2023-05-26 MED ORDER — LACTATED RINGERS IV BOLUS
500.0000 mL | Freq: Once | INTRAVENOUS | Status: AC
  Administered 2023-05-26: 500 mL via INTRAVENOUS

## 2023-05-26 NOTE — Progress Notes (Signed)
HD#0 SUBJECTIVE:  Patient Summary: Lynn Estrada is a 62 y.o. with a pertinent PMH of HFrEF (EF <20% 07/24), LBBB with defibrillator in place, HTN, CAD presented to ED with left-sided numbness and tingling, slurring of speech that started around noon, admitted for stroke workup.   Overnight Events: None   Interim History: Patient is evaluated bedside, she was sleeping. Patient reports that she has left-sided upper abdominal pain, reports it feels like indigestion. Had a bowel movement yesterday, no concerns. Patient denied any weakness, numbness, tingling of her bilateral upper and lower extremities. Patient any chest pain shortness of breath. Denied any dizziness or lightheadedness with positional changes. Patient is reporting that she is eating and drinking well. She denies any dysuria, reports mild pruritus.   10:20 AM: Patient is re-evaluated at bedside, had her breakfast, sitting in bed. Reports that left upper abdominal pain had improved after taking tums. She reports mild dizziness after eating breakfast, though no nausea or vomiting. No other concerns.   OBJECTIVE:  Vital Signs: Vitals:   05/25/23 2348 05/26/23 0405 05/26/23 0700 05/26/23 0824  BP: 98/60 101/70  104/76  Pulse: 73 71  81  Resp: 18 18  17   Temp: 98.4 F (36.9 C) 97.6 F (36.4 C)  97.9 F (36.6 C)  TempSrc: Oral Oral  Oral  SpO2: 100% 100%  96%  Weight:   98.4 kg   Height:       Supplemental O2: Room Air SpO2: 96 %  Filed Weights   05/24/23 1100 05/25/23 0409 05/26/23 0700  Weight: 95.8 kg 98.3 kg 98.4 kg     Intake/Output Summary (Last 24 hours) at 05/26/2023 1002 Last data filed at 05/26/2023 0600 Gross per 24 hour  Intake 240 ml  Output --  Net 240 ml   Net IO Since Admission: -25 mL [05/26/23 1002]  Physical Exam: Physical Exam  Constitutional: Laying in bed, in no acute distress HENT: normocephalic atraumatic, mucous membranes moist Eyes: conjunctiva non-erythematous, PERRLA,  EOMI Cardiovascular: regular rate , no m/r/g, no LE edema bilaterally  Pulmonary/Chest: normal work of breathing on room air, lungs clear to auscultation bilaterally, no wheezing or crackles  Abdominal: +left upper abdominal tenderness to palpation, + Left lateral/flank tenderness MSK: normal bulk and tone. 4/5 strength LLE, 4/5 L grip strength. Sensation intact bilaterally UE and LE.  Neurological: alert & oriented x 3, PERRLA, EOMI, CN II to XII intact, sensation intact bilaterally, 4 out of 5 strength left lower extremity, 4 out of 5 left grip strength.  Skin: warm and dry Psych: normal mood and behavior   Patient Lines/Drains/Airways Status     Active Line/Drains/Airways     Name Placement date Placement time Site Days   Peripheral IV 05/24/23 18 G Right Antecubital 05/24/23  --  Antecubital  2   Ureteral Drain/Stent Left ureter 6 Fr. 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 Patient was evaluated at bedside by neurology.  CT head negative for large vascular territory infarct or hemorrhage; CT angio showed questionable distal ACA occlusion; MRI brain showed  small acute infarct in the cortex of the posterior right cingulate gyrus and tiny remote infarct in the right cerebellar hemisphere with chronic microvascular ischemic changes. She was started on Plavix 75 mg, Aspirin 10 mg, and Lipitor 10 mg. She passed bedside swallow exam. Appreciate neurology recommendations. Calculated ASCVD risk > 7.5% to <20%: A1c 7.1; LDL 60 at goal. ECHO showed recovered EF 20-25%, no shunting per the bubble  study. Per my evaluation today, she continues to have residual Left sided weakness that is apparent in the LLE and L grip strength. Otherwise sensation intact, speech intact and coherent.   Plan: - Continue Aspirin 81 mg and Plavix 75 mg for 3 weeks, then Plavix alone.  - Lipitor 10 mg daily with LDL <70, at goal  - PT/OT recommended outpatient PT/OT   #HFrEF #Orthostatic hypotension Cardiac catheterization in January 2023 showed minimal nonobstructive CAD.  cMRI completed at that time with some concerns for myocarditis, though unable to clearly present an etiology for HF.  Echo on 01/14/2023 showed EF<20%; echo yesterday showed EF 20 to 25%. Appears euvolemic on exam, no JVD, no lower extremity edema bilaterally, no crackles on lung exam. Orthostatic vitals today, drop in 13 mmHg in Systolic's. Plan to give 500cc fluids and will re-evaluate.   -Continue digoxin 125 -Will continue to hold Entresto and Lasix w/ softer blood pressures  -Daily weight and strict I's and O's -Will continue to trend BMP  #Left upper abdominal pain Patient reports left-sided upper abdominal pain that started yesterday evening, it feels like an indigestion.  Patient reports had a bowel movement yesterday, denies any nausea or vomiting.  Denies any burning sensation in her chest.  Upon reevaluation, patient reports that she took Tums, her abdominal pain has improved. -GI cocktail today -Will re-evaluate tomorrow    #Type 2 diabetes mellitus Newly diagnosed diabetes, last A1c per chart review was 6.2 on 07/2021, she is not on any diabetic medications except for which Jardiance for heart failure.  A1c checked at this visit is 7.1.  Patient is notified about her diabetes, we will plan on starting metformin upon discharge and a close follow-up with her PCP. -SSI   #Dysuria #UTI Patient reports that she is having some burning sensation when she urinates with itching, reports this started today.  UA checked today showed  glucosuria and moderate leukocytes with WBC 11-20.  She was started on Macrobid, however had urine culture in 08/2022 showed Klebsiella, intermediate sensitivity for Macrobid. Received one dose of rocephin today; plan to start PO Keflex for 4 days upon discharge.  - UA w/ reflex pending  - Start Keflex PO for 4 days tomorrow    Best Practice: Diet: Cardiac diet IVF: Fluids: None, Rate: None VTE: heparin injection 5,000 Units Start: 05/24/23 1400 Code: Full AB: Rocephin today, will start Keflex p.o. tomorrow Therapy Recs: None, DME: other None  Family Contact: Patient reports that she will call and notify. DISPO: Anticipated discharge tomorrow to Home pending  medical management .  Signature: Jeral Pinch, D.O.  Internal Medicine Resident, PGY-1 [facility]  Pager: [phone] 10:02 AM, 05/26/2023   Please contact the on call pager after 5 pm and on weekends at [phone].

## 2023-05-26 NOTE — Evaluation (Signed)
Speech Language Pathology Evaluation Patient Details Name: Lynn Estrada MRN: [id number] DOB: Dec 07, 1960 Today's Date: 05/26/2023 Time: 7425-9563 SLP Time Calculation (min) (ACUTE ONLY): 16 min  Problem List:  Patient Active Problem List   Diagnosis Date Noted   Cerebrovascular accident (CVA) (HCC) 05/26/2023   Pyuria 05/25/2023   Left-sided weakness 05/24/2023   Benign neoplasm of cecum 03/26/2023   Benign neoplasm of descending colon 03/26/2023   Benign neoplasm of sigmoid colon 03/26/2023   Adhesive capsulitis of right shoulder 08/24/2022   Mass of right hand 02/22/2022   LBBB (left bundle branch block) 02/01/2022   Type 2 diabetes mellitus with hyperglycemia, without long-term current use of insulin (HCC) 01/22/2022   Hypertriglyceridemia 01/21/2022   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 10/26/2021   Chronic combined systolic and diastolic heart failure (HCC) 07/27/2021   Acute combined systolic (congestive) and diastolic (congestive) heart failure (HCC)    Hypocalcemia 07/12/2021   Elevated troponin 07/12/2021   Lipoma of hand 07/20/2020   Osteoarthritis of carpometacarpal (CMC) joint of thumb 07/20/2020   Pain in right hand 07/20/2020   Radial styloid tenosynovitis 07/20/2020   Other osteoarthritis of spine 10/23/2019   Suspected COVID-19 virus infection 06/11/2019   Acute respiratory failure with hypoxia (HCC) 06/11/2019   Multifocal pneumonia 06/11/2019   Carpal tunnel syndrome, bilateral 04/24/2019   Ureteral stone with hydronephrosis 12/31/2016   Hx of adenomatous colonic polyps 08/03/2016   Normocytic anemia 07/10/2016   Essential hypertension 07/26/2015   Eczema 07/26/2015   Overweight (BMI 25.0-29.9) 07/26/2015   Past Medical History:  Past Medical History:  Diagnosis Date   Anemia    Arthritis    CHF (congestive heart failure) (HCC) 12/06/2022   EF is 20% to 25 %   COPD (chronic obstructive pulmonary disease) (HCC)     Diabetes mellitus without complication (HCC)    Eczema    GERD (gastroesophageal reflux disease)    History of adenomatous polyp of colon    08-03-2016  tubular adenoma x2 and hyperplastic   History of chronic gastritis 08/03/2016   History of ectopic pregnancy 1988   s/p  left salpingectomy   History of endometriosis    History of kidney stones    History of partial nephrectomy    right pelvis for very large stone   History of pneumonia 07/02/2016   CAP   History of sepsis    07-01-2016  sepsis w/ pyelonephritis, CAP /   12-31-2016  urosepsis w/ kidney stone obstruction   History of uterine leiomyoma    Hx of adenomatous colonic polyps 08/03/2016   2 adenomas, 1 hpp and 1 lost polyp 2018 all < 1 cm    Hyperlipidemia    Hypertension    Left ureteral stone    Myocardial infarction [facility])    Nephrolithiasis    left obstructive stone and right non-obstructive stone  per CT 12-31-2016   Urgency of urination    Past Surgical History:  Past Surgical History:  Procedure Laterality Date   ABDOMINAL HYSTERECTOMY  01/20/2007   w/ Lysis Adhesions/  Left salpingoophorectomy/  Right Salpingectomy   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   Procedure: BIV ICD INSERTION CRT-D;  Surgeon: Maurice Small, MD;  Location: [facility];  Service: Cardiovascular;  Laterality: N/A;   CHOLECYSTECTOMY  1994   and Right Partial Nephrectomy (pelvis for very large stone)   COLONOSCOPY WITH PROPOFOL N/A 03/26/2023   Procedure: COLONOSCOPY WITH PROPOFOL;  Surgeon: Iva Boop, MD;  Location: [facility];  Service: Gastroenterology;  Laterality: N/A;   CYSTOSCOPY W/ URETERAL STENT PLACEMENT Left 12/31/2016   Procedure: CYSTOSCOPY WITH RETROGRADE PYELOGRAM/ LEFT URETERAL STENT PLACEMENT;  Surgeon: Sebastian Ache, MD;  Location: [facility];  Service: Urology;  Laterality: Left;   CYSTOSCOPY WITH RETROGRADE PYELOGRAM, URETEROSCOPY AND STENT PLACEMENT Left 01/18/2017   Procedure: 1ST STAGE CYSTOSCOPY  WITH RETROGRADE PYELOGRAM, URETEROSCOPY AND STENT REPLACEMENT;  Surgeon: Sebastian Ache, MD;  Location: [facility];  Service: Urology;  Laterality: Left;   CYSTOSCOPY WITH RETROGRADE PYELOGRAM, URETEROSCOPY AND STENT PLACEMENT Left 02/01/2017   Procedure: 2ND STAGE CYSTOSCOPY WITH RETROGRADE PYELOGRAM, URETEROSCOPY AND STENT REPLACEMENT;  Surgeon: Sebastian Ache, MD;  Location: [facility];  Service: Urology;  Laterality: Left;   ECTOPIC PREGNANCY SURGERY  1988   Left Salpingectomy   HOLMIUM LASER APPLICATION Left 01/18/2017   Procedure: HOLMIUM LASER APPLICATION;  Surgeon: Sebastian Ache, MD;  Location: [facility];  Service: Urology;  Laterality: Left;   HOLMIUM LASER APPLICATION Left 02/01/2017   Procedure: HOLMIUM LASER APPLICATION;  Surgeon: Sebastian Ache, MD;  Location: [facility];  Service: Urology;  Laterality: Left;   LUMBAR DISC SURGERY  01/1999   right L5 -- S1   POLYPECTOMY  03/26/2023   Procedure: POLYPECTOMY;  Surgeon: Iva Boop, MD;  Location: [facility];  Service: Gastroenterology;;   RE-EXPLORATION LUMBAR/  LAMINECTOMY AND MICRODISKECTOMY  10/14/2001   right L5 -- S1   RIGHT HEART CATH N/A 05/23/2022   Procedure: RIGHT HEART CATH;  Surgeon: Dolores Patty, MD;  Location: [facility];  Service: Cardiovascular;  Laterality: N/A;   RIGHT/LEFT HEART CATH AND CORONARY ANGIOGRAPHY N/A 07/18/2021   Procedure: RIGHT/LEFT HEART CATH AND CORONARY ANGIOGRAPHY;  Surgeon: Dolores Patty, MD;  Location: [facility];  Service: Cardiovascular;  Laterality: N/A;   TUBAL LIGATION Bilateral 10/17/1999   PPTL   UMBILICAL HERNIA REPAIR  age 71   HPI:  62 y/o F admitted 05/24/23 with Lt sided weakness and slurred speech which resolved. 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 PMH: HTN, gastritis, GERD, CHF, COPD, DM, CAD, HLD   Assessment /  Plan / Recommendation Clinical Impression  Cognitive-linguistic evaluation complete with patient scoring largely [facility] for all tasks assessed. Short term memory assessment revealed patient recalling 2/4 words initially, 4/4 with cue which is slightly below normal for age however patient does report fatigue due to poor sleep which may be impacting. Overall, do not feel that patient is in need of SLP services at this time however if short term memory deficits are ongoing after d/c, OP SLP services are an option. Discussed with patient and she verbalized understanding. No acute SLP f/u indicated.    SLP Assessment  SLP Recommendation/Assessment: Patient does not need any further Speech Lanaguage Pathology Services SLP Visit Diagnosis: Cognitive communication deficit (R41.841)    Recommendations for follow up therapy are one component of a multi-disciplinary discharge planning process, led by the attending physician.  Recommendations may be updated based on patient status, additional functional criteria and insurance authorization.    Follow Up Recommendations  No SLP follow up    Assistance Recommended at Discharge  None           SLP Evaluation Cognition  Overall Cognitive Status: Within Functional Limits for tasks assessed       Comprehension  Auditory Comprehension Overall Auditory Comprehension: Appears within functional  limits for tasks assessed Visual Recognition/Discrimination Discrimination: Within Function Limits Reading Comprehension Reading Status: Within funtional limits    Expression Expression Primary Mode of Expression: Verbal Verbal Expression Overall Verbal Expression: Appears within functional limits for tasks assessed   Oral / Motor  Oral Motor/Sensory Function Overall Oral Motor/Sensory Function: Within functional limits Motor Speech Overall Motor Speech: Appears within functional limits for tasks assessed           Ferdinand Lango MA, CCC-SLP  Kairie Vangieson  Meryl 05/26/2023, 11:18 AM

## 2023-05-27 ENCOUNTER — Other Ambulatory Visit (HOSPITAL_COMMUNITY): Payer: Self-pay

## 2023-05-27 DIAGNOSIS — I509 Heart failure, unspecified: Secondary | ICD-10-CM

## 2023-05-27 DIAGNOSIS — M940 Chondrocostal junction syndrome [Tietze]: Secondary | ICD-10-CM

## 2023-05-27 DIAGNOSIS — Z7984 Long term (current) use of oral hypoglycemic drugs: Secondary | ICD-10-CM

## 2023-05-27 DIAGNOSIS — Z95810 Presence of automatic (implantable) cardiac defibrillator: Secondary | ICD-10-CM

## 2023-05-27 DIAGNOSIS — I69354 Hemiplegia and hemiparesis following cerebral infarction affecting left non-dominant side: Secondary | ICD-10-CM | POA: Diagnosis not present

## 2023-05-27 DIAGNOSIS — N390 Urinary tract infection, site not specified: Secondary | ICD-10-CM

## 2023-05-27 DIAGNOSIS — E119 Type 2 diabetes mellitus without complications: Secondary | ICD-10-CM

## 2023-05-27 LAB — CBC
HCT: 33.5 % — ABNORMAL LOW (ref 36.0–46.0)
Hemoglobin: 10.7 g/dL — ABNORMAL LOW (ref 12.0–15.0)
MCH: 29.1 pg (ref 26.0–34.0)
MCHC: 31.9 g/dL (ref 30.0–36.0)
MCV: 91 fL (ref 80.0–100.0)
Platelets: 213 10*3/uL (ref 150–400)
RBC: 3.68 MIL/uL — ABNORMAL LOW (ref 3.87–5.11)
RDW: 13.7 % (ref 11.5–15.5)
WBC: 6.3 10*3/uL (ref 4.0–10.5)
nRBC: 0 % (ref 0.0–0.2)

## 2023-05-27 LAB — GLUCOSE, CAPILLARY
Glucose-Capillary: 129 mg/dL — ABNORMAL HIGH (ref 70–99)
Glucose-Capillary: 239 mg/dL — ABNORMAL HIGH (ref 70–99)

## 2023-05-27 MED ORDER — LIDOCAINE 5 % EX PTCH
1.0000 | MEDICATED_PATCH | CUTANEOUS | 0 refills | Status: DC
  Filled 2023-05-27: qty 30, 30d supply, fill #0
  Filled 2023-06-25: qty 5, 5d supply, fill #0

## 2023-05-27 MED ORDER — LIDOCAINE 4 % EX CREA
TOPICAL_CREAM | Freq: Four times a day (QID) | CUTANEOUS | Status: DC | PRN
  Filled 2023-05-27: qty 5

## 2023-05-27 MED ORDER — METFORMIN HCL 500 MG PO TABS
500.0000 mg | ORAL_TABLET | Freq: Two times a day (BID) | ORAL | 0 refills | Status: DC
  Filled 2023-05-27: qty 60, 30d supply, fill #0

## 2023-05-27 MED ORDER — CALCIUM CARBONATE ANTACID 500 MG PO CHEW
1.0000 | CHEWABLE_TABLET | Freq: Two times a day (BID) | ORAL | 0 refills | Status: DC
  Filled 2023-05-27: qty 60, 30d supply, fill #0

## 2023-05-27 MED ORDER — LIDOCAINE 4 % EX CREA
TOPICAL_CREAM | Freq: Four times a day (QID) | CUTANEOUS | 0 refills | Status: DC | PRN
  Filled 2023-05-27: qty 30, fill #0

## 2023-05-27 MED ORDER — CEPHALEXIN 500 MG PO CAPS
500.0000 mg | ORAL_CAPSULE | Freq: Four times a day (QID) | ORAL | 0 refills | Status: DC
  Filled 2023-05-27: qty 12, 3d supply, fill #0

## 2023-05-27 MED ORDER — ASPIRIN 81 MG PO TBEC
81.0000 mg | DELAYED_RELEASE_TABLET | Freq: Every day | ORAL | 0 refills | Status: AC
  Filled 2023-05-27: qty 19, 19d supply, fill #0

## 2023-05-27 MED ORDER — CLOPIDOGREL BISULFATE 75 MG PO TABS
75.0000 mg | ORAL_TABLET | Freq: Every day | ORAL | 0 refills | Status: DC
  Filled 2023-05-27: qty 30, 30d supply, fill #0

## 2023-05-27 MED ORDER — CEFADROXIL 500 MG PO CAPS
500.0000 mg | ORAL_CAPSULE | Freq: Two times a day (BID) | ORAL | 0 refills | Status: DC
  Filled 2023-05-27: qty 5, 3d supply, fill #0

## 2023-05-27 MED ORDER — EMPAGLIFLOZIN 10 MG PO TABS: 10.0000 mg | ORAL_TABLET | Freq: Every day | ORAL | Status: DC

## 2023-05-27 NOTE — Progress Notes (Signed)
Occupational Therapy Treatment Patient Details Name: Lynn Estrada MRN: [id number] DOB: Feb 27, 1961 Today's Date: 05/27/2023   History of present illness 62 y/o F admitted 05/24/23 with Lt sided weakness and slurred speech which resolved. 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 PMH: HTN, gastritis, GERD, CHF, COPD, DM, CAD, HLD   OT comments  Pt with good progress toward established OT goals. Focus session on return to ADL and further visual assessment. Pt performing ADL with mod I. Pt continues to demo one additional eye shift with tracking down on L side during visual assessment but otherwise WFL. Saccades, visual fields WFL. Cognition back to baseline. Do not suspect pt will need any further follow up OT.       If plan is discharge home, recommend the following:  A Murren help with walking and/or transfers;A Courser help with bathing/dressing/bathroom;Assistance with cooking/housework;Help with stairs or ramp for entrance;Assist for transportation   Equipment Recommendations  None recommended by OT    Recommendations for Other Services      Precautions / Restrictions Precautions Precautions: Fall Restrictions Weight Bearing Restrictions: No       Mobility Bed Mobility               General bed mobility comments: in arm chair about to get dressed on arrival    Transfers Overall transfer level: Modified independent Equipment used: Rolling walker (2 wheels) Transfers: Sit to/from Stand Sit to Stand: Modified independent (Device/Increase time)                 Balance Overall balance assessment: Mild deficits observed, not formally tested                                         ADL either performed or assessed with clinical judgement   ADL Overall ADL's : Needs assistance/impaired                 Upper Body Dressing : Sitting;Modified independent   Lower Body  Dressing: Sit to/from stand;Modified independent   Toilet Transfer: Modified Independent;Rolling walker (2 wheels)           Functional mobility during ADLs: Modified independent (within room)      Extremity/Trunk Assessment              Vision   Vision Assessment?: Yes Eye Alignment: Within Functional Limits Ocular Range of Motion: Within Functional Limits Alignment/Gaze Preference: Within Defined Limits Tracking/Visual Pursuits: Decreased smoothness of eye movement to LEFT superior field (one additional eye shift) Visual Fields: No apparent deficits   Perception     Praxis      Cognition Arousal: Alert Behavior During Therapy: WFL for tasks assessed/performed Overall Cognitive Status: Within Functional Limits for tasks assessed                                          Exercises      Shoulder Instructions       General Comments      Pertinent Vitals/ Pain       Pain Assessment Pain Assessment: No/denies pain  Home Living  Prior Functioning/Environment              Frequency  Min 1X/week        Progress Toward Goals  OT Goals(current goals can now be found in the care plan section)  Progress towards OT goals: Progressing toward goals  Acute Rehab OT Goals Patient Stated Goal: get home OT Goal Formulation: With patient Time For Goal Achievement: 06/08/23 Potential to Achieve Goals: Good ADL Goals Pt Will Perform Grooming: with modified independence;standing Pt Will Perform Lower Body Dressing: with modified independence;sit to/from stand Pt Will Transfer to Toilet: with modified independence;ambulating;regular height toilet Pt/caregiver will Perform Home Exercise Program: Increased strength;Left upper extremity;Both right and left upper extremity;With written HEP provided  Plan      Co-evaluation                 AM-PAC OT "6 Clicks" Daily Activity      Outcome Measure   Help from another person eating meals?: None Help from another person taking care of personal grooming?: A Edgar Help from another person toileting, which includes using toliet, bedpan, or urinal?: A Mcdonnell Help from another person bathing (including washing, rinsing, drying)?: A Cimo Help from another person to put on and taking off regular upper body clothing?: A Lisowski Help from another person to put on and taking off regular lower body clothing?: A Pinkus 6 Click Score: 19    End of Session Equipment Utilized During Treatment: Rolling walker (2 wheels)  OT Visit Diagnosis: Unsteadiness on feet (R26.81);Muscle weakness (generalized) (M62.81);Low vision, both eyes (H54.2)   Activity Tolerance Patient tolerated treatment well   Patient Left in bed;with call bell/phone within reach   Nurse Communication Mobility status        Time: 1202-1217 OT Time Calculation (min): 15 min  Charges: OT General Charges $OT Visit: 1 Visit OT Treatments $Self Care/Home Management : 8-22 mins  Tyler Deis, OTR/L [facility] Acute Rehabilitation Office: [phone]   Myrla Halsted 05/27/2023, 1:15 PM

## 2023-05-27 NOTE — TOC Transition Note (Addendum)
Transition of Care [facility]) - CM/SW Discharge Note   Patient Details  Name: Lynn Estrada MRN: [id number] Date of Birth: Oct 03, 1960  [facility] CM/SW Contact:  Kermit Balo, RN Phone Number: 05/27/2023, 10:56 AM   Clinical Narrative:     Pt is from home with her spouse and daughter. She has supervision at home.  Pt denies issues with transportation or her home medications.  Outpatient therapy arranged through [facility]. Information on the AVS. Pt will call to schedule the first appointment. PCP: Dr Lin Givens. BSC recommended but pt is refusing this at this time. Pt has transportation  home.  Final next level of care: OP Rehab Barriers to Discharge: No Barriers Identified   Patient Goals and CMS Choice   Choice offered to / list presented to : Patient  Discharge Placement                         Discharge Plan and Services Additional resources added to the After Visit Summary for                                       Social Determinants of Health (SDOH) Interventions SDOH Screenings   Food Insecurity: No Food Insecurity (05/24/2023)  Housing: Low Risk  (05/24/2023)  Transportation Needs: No Transportation Needs (05/24/2023)  Utilities: Not At Risk (05/24/2023)  Depression (PHQ2-9): Low Risk  (03/16/2022)  Financial Resource Strain: Low Risk  (08/20/2022)   Received from [facility]  Physical Activity: Insufficiently Active (08/20/2022)   Received from [facility], [facility]  Social Connections: Socially Integrated (08/20/2022)   Received from [facility], [facility]  Stress: No Stress Concern Present (08/20/2022)   Received from [facility], [facility]  Tobacco Use: Medium Risk (05/24/2023)     Readmission Risk Interventions     No data to display

## 2023-05-27 NOTE — Discharge Summary (Addendum)
Name: Lynn Estrada MRN: [id number] DOB: 1961-06-02 62 y.o. PCP: Porfirio Oar, PA  Date of Admission: 05/24/2023 11:09 AM Date of Discharge: 05/27/2023 05/27/2023 Attending Physician: Dr. Antony Contras  DISCHARGE DIAGNOSIS:  Primary Problem: Left-sided weakness   Hospital Problems: Principal Problem:   Left-sided weakness Active Problems:   Pyuria   Cerebrovascular accident (CVA) (HCC)    DISCHARGE MEDICATIONS:   Allergies as of 05/27/2023       Reactions   Latex Rash, Hives   Bactrim [sulfamethoxazole-trimethoprim] Rash   Carvedilol Itching   Metoprolol Itching, Rash        Medication List     STOP taking these medications    amoxicillin-clavulanate 875-125 MG tablet Commonly known as: AUGMENTIN   spironolactone 25 MG tablet Commonly known as: ALDACTONE       TAKE these medications    acetaminophen 500 MG tablet Commonly known as: TYLENOL Take 500-1,000 mg by mouth every 6 (six) hours as needed (for pain.).   aspirin EC 81 MG tablet Take 1 tablet (81 mg total) by mouth daily for 19 days. Swallow whole. What changed:  when to take this additional instructions   atorvastatin 10 MG tablet Commonly known as: LIPITOR Take 10 mg by mouth daily.   Calcium Antacid 500 MG chewable tablet Generic drug: calcium carbonate Chew 1 tablet (200 mg of elemental calcium total) by mouth 2 (two) times daily with a meal.   cefadroxil 500 MG capsule Commonly known as: DURICEF Take 1 capsule (500 mg total) by mouth 2 (two) times daily.   clopidogrel 75 MG tablet Commonly known as: PLAVIX Take 1 tablet (75 mg total) by mouth daily.   digoxin 0.125 MG tablet Commonly known as: LANOXIN TAKE 1 TABLET BY MOUTH EVERY DAY   empagliflozin 10 MG Tabs tablet Commonly known as: JARDIANCE Take 1 tablet (10 mg total) by mouth daily. Start taking on: June 03, 2023 What changed: These instructions start on June 03, 2023. If you are unsure what to do until  then, ask your doctor or other care provider.   Entresto 24-26 MG Generic drug: sacubitril-valsartan Take 1 tablet by mouth 2 (two) times daily.   furosemide 40 MG tablet Commonly known as: LASIX Take 40 mg by mouth daily.   lidocaine 4 % cream Commonly known as: LMX Apply topically 4 (four) times daily as needed (Left rib pain).   lidocaine 5 % Commonly known as: Lidoderm Place 1 patch onto the skin daily. Remove & Discard patch within 12 hours or as directed by MD   metFORMIN 500 MG tablet Commonly known as: GLUCOPHAGE Take 1 tablet (500 mg total) by mouth 2 (two) times daily with a meal.   multivitamin with minerals Tabs tablet Take 1 tablet by mouth daily with lunch. One A Day for Women 50+        DISPOSITION AND FOLLOW-UP:  Lynn Estrada was discharged from [facility] in Stable condition. At the hospital follow up visit please address:  CVA: Per neurology wanted aspirin 81 mg and Plavix 75 mg for 3 weeks then Plavix alone.  Patient discharged with 81 mg of aspirin for 19 days.  Lipitor 10 mg daily with LDL less than 70 at goal.  Physical therapy and Occupational Therapy referrals are sent. Heart failure w/defibrillator: Initially we held her Lasix and Entresto in this hospitalization because of softer blood pressure and she became orthostatic; discharged her with holding spironolactone, resuming back her Entresto and Lasix.  Please evaluate  her blood pressure and start her medications, GDMT accordingly. Left upper abdominal pain/costochondritis: Discharged her with some topical ointment and Lidoderm patches. Type 2 diabetes: Newly diagnosed in this inpatient, A1c 7.1; discharged home with metformin 500 mg, the first 2 weeks then titrating to 500 mg twice daily. UTI: Discharge her with cefadroxil 500 mg BID for 3 days, holding her Jardiance until she finishes her antibiotic course.  Please check her UA.  Follow-up Recommendations: Consults: None,  already placed for neurology, physical no completion of therapy Labs: Basic Metabolic Profile, CBC, and Urinalysis Studies: None Medications: These are the new medications: Aspirin 81 mg for 19 days, Cefadroxil 500 mg BID for 3 days.  Hold spironolactone; Resume Entresto and Lasix.  Lidocaine cream and patches.  Follow-up Appointments:  Follow-up Information     [facility]. Schedule an appointment as soon as possible for a visit in 1 month(s).   Specialty: Neurology Why: stroke clinic Contact information: [address] [place] [address] [place] [address] [phone]        [facility]. Schedule an appointment as soon as possible for a visit.   Specialty: Rehabilitation Contact information: [address] [place] [address] [phone]        Porfirio Oar, [place]. Schedule an appointment as soon as possible for a visit today.   Specialty: Family Medicine Why: Hospital Follow up Contact information: [address] [place] [address] [phone]                 HOSPITAL COURSE:  Patient Summary: # Small acute infarct in the cortex of the posterior right cingulate gyrus # Tiny remote infarct in the right cerebellar hemisphere This is a pleasant 62 year old female who presented with left-sided weakness and aphasia that started at roughly around noon, Code stroke activated in ED. Patient was evaluated at bedside by neurology.  CT head negative for large vascular territory infarct or hemorrhage; CT angio showed questionable distal ACA occlusion; MRI brain showed small acute infarct in the cortex of the posterior right cingulate gyrus and tiny remote infarct in the right cerebellar hemisphere with chronic microvascular ischemic changes. She was started on Plavix 75 mg, Aspirin 81 mg, and Lipitor 10 mg. She passed bedside swallow exam. Appreciate neurology  recommendations. Calculated ASCVD risk > 7.5% to <20%: A1c 7.1; LDL 60 at goal. ECHO showed recovered EF 20-25%, no shunting per the bubble study.  She is getting treated with aspirin 81 mg and Plavix 75 mg here inpatient, per neurology will continue to treat for 3 weeks then Plavix alone afterwards.  Will continue the current dose of Lipitor 20 mg daily with LDL less than 70 at goal.  PT OT recommended outpatient physical therapy and occupational therapy.  Per evaluation today, denies any other weakness or numbness or tingling anywhere.  No other concerns at this time.   #HFrEF #Orthostatic hypotension Cardiac catheterization in January 2023 showed minimal nonobstructive CAD.  cMRI completed at that time with some concerns for myocarditis, though unable to clearly present an etiology for HF.  Echo on 01/14/2023 showed EF<20%; echo yesterday showed EF 20 to 25%. Has a defibrillator in place. Appears euvolemic on exam, no JVD, no lower extremity edema bilaterally, no crackles on lung exam. Orthostatic vitals 11/24, drop in 13 mmHg in Systolic's.  Patient was given 500 cc fluids, held her Entresto and Lasix.  Continue digoxin.  Per evaluation today, patient reports that she has  been ambulating, no symptoms of dizziness or lightheadedness.  No other concerns at this time.   #Left upper abdominal pain #Costochondritis Patient reports left-sided upper abdominal pain that started 11/23, described like an indigestion, reported regular bowel movements, no nausea or vomiting.  Noted burning sensation in her chest.  She was given Tums and a GI cocktail, reported improvement in her symptoms.  Upon my evaluation today, tender to palpation at the left upper quadrant, closer to her ribs, localized, no radiation, no rashes. Likely localized nerve like pain, plan to treat with topical lidocaine.    #Type 2 diabetes mellitus Newly diagnosed diabetes, last A1c per chart review was 6.2 on 07/2021, she is not on any diabetic  medications except for which Jardiance for heart failure.  A1c checked at this visit is 7.1., placed on SSI. Patient is notified about her diabetes, we will plan on starting metformin upon discharge and a close follow-up with her PCP.    #Dysuria #UTI Patient reports that she is having some burning sensation when she urinates with itching, reports this started today.  UA checked today showed glucosuria and moderate leukocytes with WBC 11-20.  She was started on Macrobid, however had urine culture in 08/2022 showed Klebsiella, intermediate sensitivity for Macrobid. Received one dose of rocephin 11/24; plan to start PO Keflex for 4 days upon discharge.    DISCHARGE INSTRUCTIONS:   Discharge Instructions     Ambulatory referral to Neurology   Complete by: As directed    Follow up with stroke clinic NP (Jessica Vanschaick or Darrol Angel, if both not available, consider Manson Allan, or Ahern) at [facility] in about 4 weeks. Thanks.   Ambulatory referral to Occupational Therapy   Complete by: As directed    Ambulatory referral to Physical Therapy   Complete by: As directed    Diet - low sodium heart healthy   Complete by: As directed    Discharge instructions   Complete by: As directed    You came to the hospital for stroke like symptoms.   These are the changes to medications:   For your stroke -Start aspirin 81 mg daily, take 1 tablet by mouth daily for 19 days, then stop -Start Plavix 75 mg, take 1 tablet by mouth daily every day. -Continue taking Lipitor 10 mg, take 1 tablet by mouth daily -Follow-up outpatient physical and Occupational Therapy  For your heart failure -We held your Spironolactone because of your low blood pressures, please see your primary care doctor before starting this medication to evaluate for your blood pressures -Please continue taking digoxin 125 mcg daily -Please continue Entresto and lasix  For UTI -Start Cefadroxil 500 mg, take 1 tab by mouth 2 times  daily for 3 days -START Jardiance on DECEMBER 2nd, after you have finished your antibiotics   For your type 2 diabetes -Start metformin 500 mg, start with 1 tablet by mouth daily with breakfast for the first 2 weeks then increase it to twice daily.  - Please follow up with your PCP  For your Left ribs pain - You can apply topical ointment where you feel the pain - I also sent you Lidoderm skin patches, can apply it at the area where it hurts, leave it on for 12 hours and then take it off.  If you have any of these following symptoms, please call us or seek care at an emergency department: -Chest Pain -Difficulty Breathing -Worsening abdominal pain -Syncope (passing out) -Drooping of face -Slurred speech -Sudden  weakness in your leg or arm -Fever -Chills -blood in the stool -dark black, sticky stool  I am glad you are feeling better. It was a pleasure taking care for you. I wish a good recovery and good health!   Jolynda Townley, DO   Increase activity slowly   Complete by: As directed        SUBJECTIVE:  Patient was evaluate bedside, she is sitting in bed, no acute distress.  She denies any dizziness or lightheadedness.  She reports that she has been ambulating well.  She denies any weakness, numbness or tingling.  She is tolerating diet well.  She reports she has this left-sided rib pain, localized in her left upper quadrant of her abdomen, pain is reproduced upon deep palpation, no nausea or vomiting or diarrhea.  She has good bowel movements. No rashes.  Pain appears to be likely costochondritis, gave her topical ointment to apply. Otherwise no other concerns.   Discharge Vitals:   BP (!) 98/58 (BP Location: Left Arm)   Pulse 88   Temp 99 F (37.2 C) (Oral)   Resp 18   Ht 6\' 1"  (1.854 m)   Wt 97.2 kg   SpO2 98%   BMI 28.27 kg/m   OBJECTIVE:  Physical Exam   Constitutional: Sitting in bed, eating breakfast, in no acute distress HENT: normocephalic atraumatic,  mucous membranes moist Eyes: conjunctiva non-erythematous, PERRLA, EOMI Cardiovascular: regular rate , no m/r/g, no LE edema bilaterally  Pulmonary/Chest: normal work of breathing on room air, lungs clear to auscultation bilaterally, no wheezing or crackles  Abdominal: +left upper abdominal tenderness to palpation, localized below ribs  MSK: normal bulk and tone. 4/5 strength LLE, 4/5 L grip strength. Sensation intact bilaterally UE and LE.  Neurological: Alert and oriented x 3.  Skin: warm and dry Psych: normal mood and behavior  Pertinent Labs, Studies, and Procedures:     Latest Ref Rng & Units 05/27/2023    5:53 AM 05/26/2023    4:30 AM 05/24/2023    5:43 PM  CBC  WBC 4.0 - 10.5 K/uL 6.3  6.1  5.6   Hemoglobin 12.0 - 15.0 g/dL 72.5  36.6  44.0   Hematocrit 36.0 - 46.0 % 33.5  34.2  37.1   Platelets 150 - 400 K/uL 213  227  253        Latest Ref Rng & Units 05/26/2023    4:30 AM 05/24/2023   12:56 PM 05/24/2023   12:52 PM  CMP  Glucose 70 - 99 mg/dL 347  425  956   BUN 8 - 23 mg/dL 18  16  18    Creatinine 0.44 - 1.00 mg/dL 3.87  5.64  3.32   Sodium 135 - 145 mmol/L 136  139  140   Potassium 3.5 - 5.1 mmol/L 4.1  4.3  4.3   Chloride 98 - 111 mmol/L 106  104  107   CO2 22 - 32 mmol/L 22  26  23    Calcium 8.9 - 10.3 mg/dL 8.9  9.2  9.3   Total Protein 6.5 - 8.1 g/dL  7.4  7.4   Total Bilirubin <1.2 mg/dL  0.5  0.3   Alkaline Phos 38 - 126 U/L  53  49   AST 15 - 41 U/L  17  21   ALT 0 - 44 U/L  20  21     ECHOCARDIOGRAM COMPLETE  Result Date: 05/25/2023    ECHOCARDIOGRAM REPORT   Patient Name:  Vedanshi D Frary Date of Exam: 05/25/2023 Medical Rec #:  [id number]         Height:       73.0 in Accession #:    [id number]        Weight:       216.7 lb Date of Birth:  05/22/1961        BSA:          2.226 m Patient Age:    62 years          BP:           95/74 mmHg Patient Gender: F                 HR:           75 bpm. Exam Location:  Inpatient Procedure: 2D Echo, 3D Echo,  Cardiac Doppler and Color Doppler Indications:    Stroke  History:        Patient has prior history of Echocardiogram examinations, most                 recent 12/06/2022. CHF, Previous Myocardial Infarction, COPD; Risk                 Factors:Diabetes, GERD, Dyslipidemia and Hypertension.  Sonographer:    Raeford Razor RDCS Referring Phys: [id number] CAROLYN GUILLOUD IMPRESSIONS  1. Left ventricular ejection fraction, by estimation, is 20 to 25%. The left ventricle has severely decreased function. The left ventricle demonstrates global hypokinesis. The left ventricular internal cavity size was severely dilated. Left ventricular diastolic parameters are consistent with Grade I diastolic dysfunction (impaired relaxation). Elevated left ventricular end-diastolic pressure.  2. Right ventricular systolic function is normal. The right ventricular size is normal. Tricuspid regurgitation signal is inadequate for assessing PA pressure.  3. The mitral valve is normal in structure. Trivial mitral valve regurgitation. No evidence of mitral stenosis.  4. The aortic valve is tricuspid. Aortic valve regurgitation is mild. No aortic stenosis is present.  5. The inferior vena cava is normal in size with greater than 50% respiratory variability, suggesting right atrial pressure of 3 mmHg. Comparison(s): No significant change from prior study. FINDINGS  Left Ventricle: Left ventricular ejection fraction, by estimation, is 20 to 25%. The left ventricle has severely decreased function. The left ventricle demonstrates global hypokinesis. The left ventricular internal cavity size was severely dilated. There is no left ventricular hypertrophy. Left ventricular diastolic parameters are consistent with Grade I diastolic dysfunction (impaired relaxation). Elevated left ventricular end-diastolic pressure. Right Ventricle: The right ventricular size is normal. No increase in right ventricular wall thickness. Right ventricular systolic function is  normal. Tricuspid regurgitation signal is inadequate for assessing PA pressure. Left Atrium: Left atrial size was normal in size. Right Atrium: Right atrial size was normal in size. Pericardium: Trivial pericardial effusion is present. Mitral Valve: The mitral valve is normal in structure. Trivial mitral valve regurgitation. No evidence of mitral valve stenosis. Tricuspid Valve: The tricuspid valve is normal in structure. Tricuspid valve regurgitation is trivial. No evidence of tricuspid stenosis. Aortic Valve: The aortic valve is tricuspid. Aortic valve regurgitation is mild. No aortic stenosis is present. Aortic valve mean gradient measures 4.0 mmHg. Aortic valve peak gradient measures 8.0 mmHg. Aortic valve area, by VTI measures 2.04 cm. Pulmonic Valve: The pulmonic valve was normal in structure. Pulmonic valve regurgitation is trivial. No evidence of pulmonic stenosis. Aorta: The aortic root and ascending aorta are structurally normal, with no evidence of dilitation. Venous: The  inferior vena cava is normal in size with greater than 50% respiratory variability, suggesting right atrial pressure of 3 mmHg. IAS/Shunts: No atrial level shunt detected by color flow Doppler. Additional Comments: A device lead is visualized.  LEFT VENTRICLE PLAX 2D LVIDd:         7.70 cm   Diastology LVIDs:         6.80 cm   LV e' medial:    4.13 cm/s LV PW:         1.00 cm   LV E/e' medial:  19.8 LV IVS:        1.00 cm   LV e' lateral:   3.70 cm/s LVOT diam:     2.10 cm   LV E/e' lateral: 22.1 LV SV:         53 LV SV Index:   24 LVOT Area:     3.46 cm                           3D Volume EF:                          3D EF:        24 %                          LV EDV:       342 ml                          LV ESV:       260 ml                          LV SV:        82 ml RIGHT VENTRICLE             IVC RV Basal diam:  3.30 cm     IVC diam: 1.10 cm RV Mid diam:    2.30 cm RV S prime:     10.10 cm/s TAPSE (M-mode): 2.1 cm LEFT ATRIUM              Index        RIGHT ATRIUM           Index LA diam:        4.50 cm 2.02 cm/m   RA Area:     17.10 cm LA Vol (A2C):   69.2 ml 31.08 ml/m  RA Volume:   45.30 ml  20.35 ml/m LA Vol (A4C):   67.1 ml 30.14 ml/m LA Biplane Vol: 67.9 ml 30.50 ml/m  AORTIC VALVE AV Area (Vmax):    2.02 cm AV Area (Vmean):   1.85 cm AV Area (VTI):     2.04 cm AV Vmax:           141.00 cm/s AV Vmean:          97.000 cm/s AV VTI:            0.261 m AV Peak Grad:      8.0 mmHg AV Mean Grad:      4.0 mmHg LVOT Vmax:         82.30 cm/s LVOT Vmean:        51.900 cm/s LVOT VTI:          0.154 m LVOT/AV VTI ratio: 0.59  AORTA Ao Root diam: 3.30 cm Ao Asc diam:  3.00 cm MITRAL VALVE MV Area (PHT): 4.21 cm    SHUNTS MV Decel Time: 180 msec    Systemic VTI:  0.15 m MV E velocity: 81.80 cm/s  Systemic Diam: 2.10 cm MV A velocity: 94.90 cm/s MV E/A ratio:  0.86 Vishnu Priya Mallipeddi Electronically signed by Winfield Rast Mallipeddi Signature Date/Time: 05/25/2023/4:15:10 PM    Final    MR BRAIN WO CONTRAST  Result Date: 05/24/2023 CLINICAL DATA:  Neuro deficit, acute, stroke suspected. EXAM: MRI HEAD WITHOUT CONTRAST TECHNIQUE: Multiplanar, multiecho pulse sequences of the brain and surrounding structures were obtained without intravenous contrast. COMPARISON:  Head CT May 24, 2023. FINDINGS: Brain: Small focus of restricted diffusion in the cortex of the posterior right cingulate gyrus (ACA territory). No hemorrhage, hydrocephalus, extra-axial collection mass lesion. Tiny remote remote infarct in the right cerebellar hemisphere. Scattered foci of T2 hyperintensity within the white matter of the cerebral hemispheres, nonspecific. Vascular: Normal flow voids. Skull and upper cervical spine: Normal marrow signal. Sinuses/Orbits: Negative. Other: None. IMPRESSION: 1. Small acute infarct in the cortex of the posterior right cingulate gyrus (right ACA territory). 2. Tiny remote infarct in the right cerebellar hemisphere. 3.  Scattered foci of T2 hyperintensity within the white matter of the cerebral hemispheres, nonspecific but may represent moderate chronic microvascular ischemic changes. Electronically Signed   By: Baldemar Lenis M.D.   On: 05/24/2023 17:08   CT ANGIO HEAD NECK W WO CM W PERF (CODE STROKE)  Result Date: 05/24/2023 CLINICAL DATA:  62 year old female code stroke presentation. EXAM: CT ANGIOGRAPHY HEAD AND NECK CT PERFUSION BRAIN TECHNIQUE: Multidetector CT imaging of the head and neck was performed using the standard protocol during bolus administration of intravenous contrast. Multiplanar CT image reconstructions and MIPs were obtained to evaluate the vascular anatomy. Carotid stenosis measurements (when applicable) are obtained utilizing NASCET criteria, using the distal internal carotid diameter as the denominator. Multiphase CT imaging of the brain was performed following IV bolus contrast injection. Subsequent parametric perfusion maps were calculated using RAPID software. RADIATION DOSE REDUCTION: This exam was performed according to the departmental dose-optimization program which includes automated exposure control, adjustment of the mA and/or kV according to patient size and/or use of iterative reconstruction technique. CONTRAST:  OMNIPAQUE IOHEXOL 350 MG/ML SOLN COMPARISON:  Head CT today. FINDINGS: CT Brain Perfusion Findings: ASPECTS: 10 CBF (<30%) Volume: 0mL Perfusion (Tmax>6.0s) volume: 0mL Mismatch Volume: Not applicable Infarction Location:Not applicable CTA NECK Skeleton: Widespread advanced cervical spine degeneration. Multilevel degenerative spinal stenosis. No acute osseous abnormality identified. Upper chest: Left chest pacemaker type device. Negative visible mediastinum. Minor dependent atelectasis. Other neck: Glottis is closed. Partially retropharyngeal course of both carotids on series 7, image 92 (normal variant). Aortic arch: Bovine arch configuration. Moderate  Calcified aortic atherosclerosis. Mild arch tortuosity. Right carotid system: Tortuous brachiocephalic artery. Mildly tortuous right CCA origin. No plaque or stenosis. Negative right carotid bifurcation. Partially retropharyngeal right ICA without plaque or stenosis. Left carotid system: Tortuous bovine CCA origin without plaque or stenosis. Mild calcified plaque at the proximal left ICA without stenosis. Partially retropharyngeal course with tortuosity. Vertebral arteries: Proximal right subclavian artery and right vertebral artery origin are normal. Right vertebral is patent with tortuosity to the skull base, fairly codominant. Proximal left subclavian artery soft and calcified plaque without stenosis. Normal left vertebral artery origin. Tortuous V1 segment (series 9, image 122). Patent left vertebral to the skull base with no significant plaque  or stenosis. CTA HEAD Posterior circulation: Mild left V4 calcified plaque. Dominant left V4 segment. No distal vertebral or vertebrobasilar junction stenosis. Dominant appearing AICAs are patent. Patent basilar artery. There is mild mid basilar irregularity but no significant stenosis. Patent SCA origins. Fetal type bilateral PCA origins. Left PCA branches are within normal limits. Right PCA mild P2 segment irregularity and stenosis, otherwise within normal limits. Anterior circulation: Both ICA siphons are patent. Minimal left siphon calcified plaque without stenosis. Mild right siphon calcified plaque without stenosis. Normal posterior communicating artery origins. Patent carotid termini, MCA and ACA origins. Dominant left A1 segment. Normal anterior communicating artery. Bilateral ACA branches are within normal limits except for abrupt termination of a small distal ACA branch or possibly venous branch contamination series 12, image 22. Left MCA M1 segment is tortuous. Left MCA trifurcation is patent without stenosis. Right MCA M1 segment is tortuous. Right MCA  bifurcation is patent without stenosis. Bilateral MCA branches are within normal limits. Venous sinuses: Patent. Anatomic variants: Mildly dominant left vertebral artery. Fetal type PCA origins. Mildly dominant left ACA A1. Review of the MIP images confirms the above findings IMPRESSION: 1. Negative CT Perfusion. And CTA is negative for large vessel occlusion. But there is a questionable distal ACA occlusion (series 12, image 22, probably A4), but might instead be venous artifact. 2. Otherwise generally mild for age atherosclerosis in the head and neck. No other hemodynamically significant arterial stenosis. 3.  Aortic Atherosclerosis (ICD10-I70.0). Study discussed by telephone with Dr. Erick Blinks on 05/24/2023 at 11:41 . Electronically Signed   By: Odessa Fleming M.D.   On: 05/24/2023 11:41   CT HEAD CODE STROKE WO CONTRAST`  Result Date: 05/24/2023 CLINICAL DATA:  Code stroke. EXAM: CT HEAD WITHOUT CONTRAST TECHNIQUE: Contiguous axial images were obtained from the base of the skull through the vertex without intravenous contrast. RADIATION DOSE REDUCTION: This exam was performed according to the departmental dose-optimization program which includes automated exposure control, adjustment of the mA and/or kV according to patient size and/or use of iterative reconstruction technique. COMPARISON:  None Available. FINDINGS: Brain: No evidence of acute large vascular territory infarction, hemorrhage, hydrocephalus, extra-axial collection or mass lesion/mass effect. Vascular: No hyperdense vessel. Skull: No acute fracture. Sinuses/Orbits: Clear sinuses.  No acute orbital findings. ASPECTS [facility] Stroke Program Early CT Score) total score (0-10 with 10 being normal): 10. IMPRESSION: 1. No evidence of acute large vascular territory infarct or acute hemorrhage. 2. ASPECTS is 10. Code stroke imaging results were communicated on 05/24/2023 at 11:18 am to provider Dr. Derry Lory via secure text paging. Electronically  Signed   By: Feliberto Harts M.D.   On: 05/24/2023 11:18     Signed: Jeral Pinch, D.O.  Internal Medicine Resident, PGY-1 [facility]  Pager: [phone] 1:27 PM, 05/27/2023

## 2023-05-27 NOTE — Progress Notes (Signed)
Heart Failure Navigator Progress Note  Assessed for Heart & Vascular TOC clinic readiness.  Patient does not meet criteria due to Advanced Heart Failure Team patient of Dr. Bensimhon.   Navigator will sign off at this time.   Lucilia Yanni, BSN, RN Heart Failure Nurse Navigator Secure Chat Only   

## 2023-05-27 NOTE — Discharge Instructions (Addendum)
You came to the hospital for stroke like symptoms.   These are the changes to medications:   For your stroke -Start aspirin 81 mg daily, take 1 tablet by mouth daily for 19 days, then stop -Start Plavix 75 mg, take 1 tablet by mouth daily every day. -Continue taking Lipitor 10 mg, take 1 tablet by mouth daily -Follow-up outpatient physical and Occupational Therapy  For your heart failure -We held your Lasix and Spironolactone because of your low blood pressures, please see your primary care doctor before starting these medication to evaluate for your blood pressures -Please continue taking digoxin 125 mcg daily -Please continue Entresto   For UTI -Start Keflex 500 mg, take 1 tab by mouth 4 times daily for 3 days -START Jardiance on DECEMBER 2nd, after you have finished your antibiotics   For your type 2 diabetes -Start metformin 500 mg, start with 1 tablet by mouth daily with breakfast for the first 2 weeks then increase it to twice daily.  - Please follow up with your PCP  For your Left ribs pain - You can apply topical ointment where you feel the pain - I also sent you Lidoderm skin patches, can apply it at the area where it hurts, leave it on for 12 hours and then take it off.  If you have any of these following symptoms, please call us or seek care at an emergency department: -Chest Pain -Difficulty Breathing -Worsening abdominal pain -Syncope (passing out) -Drooping of face -Slurred speech -Sudden weakness in your leg or arm -Fever -Chills -blood in the stool -dark black, sticky stool  I am glad you are feeling better. It was a pleasure taking care for you. I wish a good recovery and good health!   Jeral Pinch, DO

## 2023-05-27 NOTE — Progress Notes (Signed)
Discharge instructions given. Patient verbalized understanding and all questions were answered.  ?

## 2023-06-04 NOTE — Therapy (Incomplete)
OUTPATIENT PHYSICAL THERAPY NEURO EVALUATION   Patient Name: Lynn Estrada MRN: [id number] DOB:1960/11/14, 62 y.o., female Today's Date: 06/04/2023   PCP:  Porfirio Oar, PA   REFERRING PROVIDER: Reymundo Poll, MD  END OF SESSION:   Past Medical History:  Diagnosis Date   Anemia    Arthritis    CHF (congestive heart failure) (HCC) 12/06/2022   EF is 20% to 25 %   COPD (chronic obstructive pulmonary disease) (HCC)    Diabetes mellitus without complication (HCC)    Eczema    GERD (gastroesophageal reflux disease)    History of adenomatous polyp of colon    08-03-2016  tubular adenoma x2 and hyperplastic   History of chronic gastritis 08/03/2016   History of ectopic pregnancy 1988   s/p  left salpingectomy   History of endometriosis    History of kidney stones    History of partial nephrectomy    right pelvis for very large stone   History of pneumonia 07/02/2016   CAP   History of sepsis    07-01-2016  sepsis w/ pyelonephritis, CAP /   12-31-2016  urosepsis w/ kidney stone obstruction   History of uterine leiomyoma    Hx of adenomatous colonic polyps 08/03/2016   2 adenomas, 1 hpp and 1 lost polyp 2018 all < 1 cm    Hyperlipidemia    Hypertension    Left ureteral stone    Myocardial infarction [facility])    Nephrolithiasis    left obstructive stone and right non-obstructive stone  per CT 12-31-2016   Urgency of urination    Past Surgical History:  Procedure Laterality Date   ABDOMINAL HYSTERECTOMY  01/20/2007   w/ Lysis Adhesions/  Left salpingoophorectomy/  Right Salpingectomy   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   Procedure: BIV ICD INSERTION CRT-D;  Surgeon: Maurice Small, MD;  Location: [facility];  Service: Cardiovascular;  Laterality: N/A;   CHOLECYSTECTOMY  1994   and Right Partial Nephrectomy (pelvis for very large stone)   COLONOSCOPY WITH PROPOFOL N/A 03/26/2023   Procedure: COLONOSCOPY WITH PROPOFOL;  Surgeon: Iva Boop,  MD;  Location: [facility];  Service: Gastroenterology;  Laterality: N/A;   CYSTOSCOPY W/ URETERAL STENT PLACEMENT Left 12/31/2016   Procedure: CYSTOSCOPY WITH RETROGRADE PYELOGRAM/ LEFT URETERAL STENT PLACEMENT;  Surgeon: Sebastian Ache, MD;  Location: [facility];  Service: Urology;  Laterality: Left;   CYSTOSCOPY WITH RETROGRADE PYELOGRAM, URETEROSCOPY AND STENT PLACEMENT Left 01/18/2017   Procedure: 1ST STAGE CYSTOSCOPY WITH RETROGRADE PYELOGRAM, URETEROSCOPY AND STENT REPLACEMENT;  Surgeon: Sebastian Ache, MD;  Location: [facility];  Service: Urology;  Laterality: Left;   CYSTOSCOPY WITH RETROGRADE PYELOGRAM, URETEROSCOPY AND STENT PLACEMENT Left 02/01/2017   Procedure: 2ND STAGE CYSTOSCOPY WITH RETROGRADE PYELOGRAM, URETEROSCOPY AND STENT REPLACEMENT;  Surgeon: Sebastian Ache, MD;  Location: [facility];  Service: Urology;  Laterality: Left;   ECTOPIC PREGNANCY SURGERY  1988   Left Salpingectomy   HOLMIUM LASER APPLICATION Left 01/18/2017   Procedure: HOLMIUM LASER APPLICATION;  Surgeon: Sebastian Ache, MD;  Location: [facility];  Service: Urology;  Laterality: Left;   HOLMIUM LASER APPLICATION Left 02/01/2017   Procedure: HOLMIUM LASER APPLICATION;  Surgeon: Sebastian Ache, MD;  Location: [facility];  Service: Urology;  Laterality: Left;   LUMBAR DISC SURGERY  01/1999   right L5 -- S1   POLYPECTOMY  03/26/2023   Procedure: POLYPECTOMY;  Surgeon: Iva Boop, MD;  Location: [facility];  Service: Gastroenterology;;   RE-EXPLORATION LUMBAR/  LAMINECTOMY AND MICRODISKECTOMY  10/14/2001   right L5 -- S1   RIGHT HEART CATH N/A 05/23/2022   Procedure: RIGHT HEART CATH;  Surgeon: Dolores Patty, MD;  Location: [facility];  Service: Cardiovascular;  Laterality: N/A;   RIGHT/LEFT HEART CATH AND CORONARY ANGIOGRAPHY N/A 07/18/2021   Procedure: RIGHT/LEFT HEART CATH AND CORONARY ANGIOGRAPHY;  Surgeon: Dolores Patty, MD;   Location: [facility];  Service: Cardiovascular;  Laterality: N/A;   TUBAL LIGATION Bilateral 10/17/1999   PPTL   UMBILICAL HERNIA REPAIR  age 59   Patient Active Problem List   Diagnosis Date Noted   Cerebrovascular accident (CVA) (HCC) 05/26/2023   Pyuria 05/25/2023   Left-sided weakness 05/24/2023   Benign neoplasm of cecum 03/26/2023   Benign neoplasm of descending colon 03/26/2023   Benign neoplasm of sigmoid colon 03/26/2023   Adhesive capsulitis of right shoulder 08/24/2022   Mass of right hand 02/22/2022   LBBB (left bundle branch block) 02/01/2022   Type 2 diabetes mellitus with hyperglycemia, without long-term current use of insulin (HCC) 01/22/2022   Hypertriglyceridemia 01/21/2022   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 10/26/2021   Chronic combined systolic and diastolic heart failure (HCC) 07/27/2021   Acute combined systolic (congestive) and diastolic (congestive) heart failure (HCC)    Hypocalcemia 07/12/2021   Elevated troponin 07/12/2021   Lipoma of hand 07/20/2020   Osteoarthritis of carpometacarpal (CMC) joint of thumb 07/20/2020   Pain in right hand 07/20/2020   Radial styloid tenosynovitis 07/20/2020   Other osteoarthritis of spine 10/23/2019   Suspected COVID-19 virus infection 06/11/2019   Acute respiratory failure with hypoxia (HCC) 06/11/2019   Multifocal pneumonia 06/11/2019   Carpal tunnel syndrome, bilateral 04/24/2019   Ureteral stone with hydronephrosis 12/31/2016   Hx of adenomatous colonic polyps 08/03/2016   Normocytic anemia 07/10/2016   Essential hypertension 07/26/2015   Eczema 07/26/2015   Overweight (BMI 25.0-29.9) 07/26/2015    ONSET DATE: 05/27/2023  REFERRING DIAG: I63.9 (ICD-10-CM) - Cerebrovascular accident (CVA), unspecified mechanism (HCC)  THERAPY DIAG:  No diagnosis found.  Rationale for Evaluation and Treatment: {HABREHAB:27488}  SUBJECTIVE:                                                                                                                                                                                              SUBJECTIVE STATEMENT: *** Pt accompanied by: {accompnied:27141}  PERTINENT HISTORY: Pt admitted 05/24/23 with Lt sided weakness and slurred speech which resolved. MRI brain Small acute infarct in the cortex of the posterior right cingulate gyrus (right ACA  territory). PMH: HTN, gastritis, GERD, CHF, COPD, DM, CAD, HLD  Heart failure w/defibrillator   PAIN:  Are you having pain? {OPRCPAIN:27236}  PRECAUTIONS: {Therapy precautions:24002}  RED FLAGS: {PT Red Flags:29287}   WEIGHT BEARING RESTRICTIONS: {Yes ***/No:24003}  FALLS: Has patient fallen in last 6 months? {fallsyesno:27318}  LIVING ENVIRONMENT: Lives with: {OPRC lives with:25569::"lives with their family"} Lives in: {Lives in:25570} Stairs: {opstairs:27293} Has following equipment at home: {Assistive devices:23999}  PLOF: {PLOF:24004}  PATIENT GOALS: ***  OBJECTIVE:  Note: Objective measures were completed at Evaluation unless otherwise noted.  DIAGNOSTIC FINDINGS: ***  COGNITION: Overall cognitive status: {cognition:24006}   SENSATION: {sensation:27233}  COORDINATION: ***  EDEMA:  {edema:24020}  MUSCLE TONE: {LE tone:25568}  MUSCLE LENGTH: Hamstrings: Right *** deg; Left *** deg Maisie Fus test: Right *** deg; Left *** deg  DTRs:  {DTR SITE:24025}  POSTURE: {posture:25561}  LOWER EXTREMITY ROM:     {AROM/PROM:27142}  Right Eval Left Eval  Hip flexion    Hip extension    Hip abduction    Hip adduction    Hip internal rotation    Hip external rotation    Knee flexion    Knee extension    Ankle dorsiflexion    Ankle plantarflexion    Ankle inversion    Ankle eversion     (Blank rows = not tested)  LOWER EXTREMITY MMT:    MMT Right Eval Left Eval  Hip flexion    Hip extension    Hip abduction    Hip adduction    Hip internal rotation    Hip  external rotation    Knee flexion    Knee extension    Ankle dorsiflexion    Ankle plantarflexion    Ankle inversion    Ankle eversion    (Blank rows = not tested)  BED MOBILITY:  {Bed mobility:24027}  TRANSFERS: Assistive device utilized: {Assistive devices:23999}  Sit to stand: {Levels of assistance:24026} Stand to sit: {Levels of assistance:24026} Chair to chair: {Levels of assistance:24026} Floor: {Levels of assistance:24026}  RAMP:  Level of Assistance: {Levels of assistance:24026} Assistive device utilized: {Assistive devices:23999} Ramp Comments: ***  CURB:  Level of Assistance: {Levels of assistance:24026} Assistive device utilized: {Assistive devices:23999} Curb Comments: ***  STAIRS: Level of Assistance: {Levels of assistance:24026} Stair Negotiation Technique: {Stair Technique:27161} with {Rail Assistance:27162} Number of Stairs: ***  Height of Stairs: ***  Comments: ***  GAIT: Gait pattern: {gait characteristics:25376} Distance walked: *** Assistive device utilized: {Assistive devices:23999} Level of assistance: {Levels of assistance:24026} Comments: ***  FUNCTIONAL TESTS:  {Functional tests:24029}  PATIENT SURVEYS:  {rehab surveys:24030}  TODAY'S TREATMENT:                                                                                                                              DATE: ***    PATIENT EDUCATION: Education details: *** Person educated: {Person educated:25204} Education method: {Education Method:25205} Education comprehension: {Education Comprehension:25206}  HOME EXERCISE PROGRAM: ***  GOALS: Goals  reviewed with patient? {yes/no:20286}  SHORT TERM GOALS: Target date: ***  *** Baseline: Goal status: INITIAL  2.  *** Baseline:  Goal status: INITIAL  3.  *** Baseline:  Goal status: INITIAL  4.  *** Baseline:  Goal status: INITIAL  5.  *** Baseline:  Goal status: INITIAL  6.  *** Baseline:  Goal status:  INITIAL  LONG TERM GOALS: Target date: ***  *** Baseline:  Goal status: INITIAL  2.  *** Baseline:  Goal status: INITIAL  3.  *** Baseline:  Goal status: INITIAL  4.  *** Baseline:  Goal status: INITIAL  5.  *** Baseline:  Goal status: INITIAL  6.  *** Baseline:  Goal status: INITIAL  ASSESSMENT:  CLINICAL IMPRESSION: Patient is a *** y.o. *** who was seen today for physical therapy evaluation and treatment for ***.   OBJECTIVE IMPAIRMENTS: {opptimpairments:25111}.   ACTIVITY LIMITATIONS: {activitylimitations:27494}  PARTICIPATION LIMITATIONS: {participationrestrictions:25113}  PERSONAL FACTORS: {Personal factors:25162} are also affecting patient's functional outcome.   REHAB POTENTIAL: {rehabpotential:25112}  CLINICAL DECISION MAKING: {clinical decision making:25114}  EVALUATION COMPLEXITY: {Evaluation complexity:25115}  PLAN:  PT FREQUENCY: {rehab frequency:25116}  PT DURATION: {rehab duration:25117}  PLANNED INTERVENTIONS: {rehab planned interventions:25118::"97110-Therapeutic exercises","97530- Therapeutic [phone]- Neuromuscular re-education","97535- Self JXBJ","47829- Manual therapy"}  PLAN FOR NEXT SESSION: ***   Drake Leach, PT, DPT 06/04/2023, 2:35 PM

## 2023-06-05 ENCOUNTER — Other Ambulatory Visit: Payer: Self-pay

## 2023-06-05 ENCOUNTER — Ambulatory Visit: Payer: Medicaid Other | Admitting: Occupational Therapy

## 2023-06-05 ENCOUNTER — Ambulatory Visit: Payer: Medicaid Other | Attending: Internal Medicine | Admitting: Physical Therapy

## 2023-06-05 ENCOUNTER — Encounter: Payer: Self-pay | Admitting: Occupational Therapy

## 2023-06-05 ENCOUNTER — Other Ambulatory Visit (HOSPITAL_COMMUNITY): Payer: Self-pay | Admitting: Internal Medicine

## 2023-06-05 DIAGNOSIS — R278 Other lack of coordination: Secondary | ICD-10-CM | POA: Insufficient documentation

## 2023-06-05 DIAGNOSIS — R29818 Other symptoms and signs involving the nervous system: Secondary | ICD-10-CM | POA: Insufficient documentation

## 2023-06-05 DIAGNOSIS — G8929 Other chronic pain: Secondary | ICD-10-CM | POA: Diagnosis present

## 2023-06-05 DIAGNOSIS — R29898 Other symptoms and signs involving the musculoskeletal system: Secondary | ICD-10-CM

## 2023-06-05 DIAGNOSIS — M6281 Muscle weakness (generalized): Secondary | ICD-10-CM

## 2023-06-05 DIAGNOSIS — M25511 Pain in right shoulder: Secondary | ICD-10-CM | POA: Diagnosis present

## 2023-06-05 NOTE — Telephone Encounter (Signed)
Please call to schedule an appointment for further refills.

## 2023-06-05 NOTE — Therapy (Signed)
OUTPATIENT OCCUPATIONAL THERAPY NEURO EVALUATION  Patient Name: Lynn Estrada MRN: [id number] DOB:14-May-1961, 62 y.o., female Today's Date: 06/05/2023  PCP: Porfirio Oar, PA REFERRING PROVIDER: Reymundo Poll, MD  END OF SESSION:  OT End of Session - 06/05/23 0849     Visit Number 1    Number of Visits 6    Date for OT Re-Evaluation 08/02/23    Authorization Type [employer]    OT Start Time 0847    OT Stop Time 0931    OT Time Calculation (min) 44 min    Equipment Utilized During Treatment Testing material    Activity Tolerance Patient tolerated treatment well    Behavior During Therapy WFL for tasks assessed/performed             Past Medical History:  Diagnosis Date   Anemia    Arthritis    CHF (congestive heart failure) (HCC) 12/06/2022   EF is 20% to 25 %   COPD (chronic obstructive pulmonary disease) (HCC)    Diabetes mellitus without complication (HCC)    Eczema    GERD (gastroesophageal reflux disease)    History of adenomatous polyp of colon    08-03-2016  tubular adenoma x2 and hyperplastic   History of chronic gastritis 08/03/2016   History of ectopic pregnancy 1988   s/p  left salpingectomy   History of endometriosis    History of kidney stones    History of partial nephrectomy    right pelvis for very large stone   History of pneumonia 07/02/2016   CAP   History of sepsis    07-01-2016  sepsis w/ pyelonephritis, CAP /   12-31-2016  urosepsis w/ kidney stone obstruction   History of uterine leiomyoma    Hx of adenomatous colonic polyps 08/03/2016   2 adenomas, 1 hpp and 1 lost polyp 2018 all < 1 cm    Hyperlipidemia    Hypertension    Left ureteral stone    Myocardial infarction [facility])    Nephrolithiasis    left obstructive stone and right non-obstructive stone  per CT 12-31-2016   Urgency of urination    Past Surgical History:  Procedure Laterality Date   ABDOMINAL HYSTERECTOMY  01/20/2007   w/ Lysis Adhesions/  Left  salpingoophorectomy/  Right Salpingectomy   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   Procedure: BIV ICD INSERTION CRT-D;  Surgeon: Maurice Small, MD;  Location: [facility];  Service: Cardiovascular;  Laterality: N/A;   CHOLECYSTECTOMY  1994   and Right Partial Nephrectomy (pelvis for very large stone)   COLONOSCOPY WITH PROPOFOL N/A 03/26/2023   Procedure: COLONOSCOPY WITH PROPOFOL;  Surgeon: Iva Boop, MD;  Location: [facility];  Service: Gastroenterology;  Laterality: N/A;   CYSTOSCOPY W/ URETERAL STENT PLACEMENT Left 12/31/2016   Procedure: CYSTOSCOPY WITH RETROGRADE PYELOGRAM/ LEFT URETERAL STENT PLACEMENT;  Surgeon: Sebastian Ache, MD;  Location: [facility];  Service: Urology;  Laterality: Left;   CYSTOSCOPY WITH RETROGRADE PYELOGRAM, URETEROSCOPY AND STENT PLACEMENT Left 01/18/2017   Procedure: 1ST STAGE CYSTOSCOPY WITH RETROGRADE PYELOGRAM, URETEROSCOPY AND STENT REPLACEMENT;  Surgeon: Sebastian Ache, MD;  Location: [facility];  Service: Urology;  Laterality: Left;   CYSTOSCOPY WITH RETROGRADE PYELOGRAM, URETEROSCOPY AND STENT PLACEMENT Left 02/01/2017   Procedure: 2ND STAGE CYSTOSCOPY WITH RETROGRADE PYELOGRAM, URETEROSCOPY AND STENT REPLACEMENT;  Surgeon: Sebastian Ache, MD;  Location: [facility];  Service: Urology;  Laterality: Left;   ECTOPIC PREGNANCY SURGERY  1988   Left Salpingectomy  HOLMIUM LASER APPLICATION Left 01/18/2017   Procedure: HOLMIUM LASER APPLICATION;  Surgeon: Sebastian Ache, MD;  Location: [facility];  Service: Urology;  Laterality: Left;   HOLMIUM LASER APPLICATION Left 02/01/2017   Procedure: HOLMIUM LASER APPLICATION;  Surgeon: Sebastian Ache, MD;  Location[facility];  Service: Urology;  Laterality: Left;   LUMBAR DISC SURGERY  01/1999   right L5 -- S1   POLYPECTOMY  03/26/2023   Procedure: POLYPECTOMY;  Surgeon: Iva Boop, MD;  Location: [facility];  Service:  Gastroenterology;;   RE-EXPLORATION LUMBAR/  LAMINECTOMY AND MICRODISKECTOMY  10/14/2001   right L5 -- S1   RIGHT HEART CATH N/A 05/23/2022   Procedure: RIGHT HEART CATH;  Surgeon: Dolores Patty, MD;  Location: [facility];  Service: Cardiovascular;  Laterality: N/A;   RIGHT/LEFT HEART CATH AND CORONARY ANGIOGRAPHY N/A 07/18/2021   Procedure: RIGHT/LEFT HEART CATH AND CORONARY ANGIOGRAPHY;  Surgeon: Dolores Patty, MD;  Location: [facility];  Service: Cardiovascular;  Laterality: N/A;   TUBAL LIGATION Bilateral 10/17/1999   PPTL   UMBILICAL HERNIA REPAIR  age 63   Patient Active Problem List   Diagnosis Date Noted   Cerebrovascular accident (CVA) (HCC) 05/26/2023   Pyuria 05/25/2023   Left-sided weakness 05/24/2023   Benign neoplasm of cecum 03/26/2023   Benign neoplasm of descending colon 03/26/2023   Benign neoplasm of sigmoid colon 03/26/2023   Adhesive capsulitis of right shoulder 08/24/2022   Mass of right hand 02/22/2022   LBBB (left bundle branch block) 02/01/2022   Type 2 diabetes mellitus with hyperglycemia, without long-term current use of insulin (HCC) 01/22/2022   Hypertriglyceridemia 01/21/2022   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 10/26/2021   Chronic combined systolic and diastolic heart failure (HCC) 07/27/2021   Acute combined systolic (congestive) and diastolic (congestive) heart failure (HCC)    Hypocalcemia 07/12/2021   Elevated troponin 07/12/2021   Lipoma of hand 07/20/2020   Osteoarthritis of carpometacarpal (CMC) joint of thumb 07/20/2020   Pain in right hand 07/20/2020   Radial styloid tenosynovitis 07/20/2020   Other osteoarthritis of spine 10/23/2019   Suspected COVID-19 virus infection 06/11/2019   Acute respiratory failure with hypoxia (HCC) 06/11/2019   Multifocal pneumonia 06/11/2019   Carpal tunnel syndrome, bilateral 04/24/2019   Ureteral stone with hydronephrosis 12/31/2016   Hx of adenomatous  colonic polyps 08/03/2016   Normocytic anemia 07/10/2016   Essential hypertension 07/26/2015   Eczema 07/26/2015   Overweight (BMI 25.0-29.9) 07/26/2015    ONSET DATE: Referral: 05/27/2023 Hospitalized: 11/22 - 05/27/23  REFERRING DIAG: I63.9 (ICD-10-CM) - Cerebrovascular accident (CVA), unspecified mechanism  THERAPY DIAG:  Muscle weakness (generalized)  Other lack of coordination  Other symptoms and signs involving the nervous system  Other symptoms and signs involving the musculoskeletal system  Chronic right shoulder pain  Rationale for Evaluation and Treatment: Rehabilitation  SUBJECTIVE:   SUBJECTIVE STATEMENT:  Pt reported that she went to the hospital when she noticed blurred vision and droopiness in L arm/leg and some trouble with speech but that her speech and vision resolved pretty quickly.  She reports she is using a cane now for her balance which she was not using before the stroke.   Pt accompanied by: self  PERTINENT HISTORY:   Presented to ED with left-sided weakness and aphasia.  Pt hospitalized x 3 days.   Discharge from [facility] had follow up info listed below:  CVA: Per neurology wanted aspirin 81 mg and  Plavix 75 mg for 3 weeks then Plavix alone.  Patient discharged with 81 mg of aspirin for 19 days.  Lipitor 10 mg daily with LDL less than 70 at goal.  Physical therapy and Occupational Therapy referrals are sent. Heart failure w/defibrillator: Initially we held her Lasix and Entresto in this hospitalization because of softer blood pressure and she became orthostatic; discharged her with holding spironolactone, resuming back her Entresto and Lasix.  Please evaluate her blood pressure and start her medications, GDMT accordingly. Left upper abdominal pain/costochondritis: Discharged her with some topical ointment and Lidoderm patches. Type 2 diabetes: Newly diagnosed in this inpatient, A1c 7.1; discharged home with metformin 500 mg, the first 2 weeks then  titrating to 500 mg twice daily. UTI: Discharge her with cefadroxil 500 mg BID for 3 days, holding her Jardiance until she finishes her antibiotic course.  Please check her UA.  PRECAUTIONS: Other: Heart Defibrillator since 08/2022  WEIGHT BEARING RESTRICTIONS: No  PAIN:  Are you having pain? No  FALLS: Has patient fallen in last 6 months? No  LIVING ENVIRONMENT: Lives with: lives with their family Lives in: House/apartment Stairs: Yes: External: 1 steps; none Has following equipment at home: Single point cane and Walker - 2 wheeled  PLOF: Independent & driving, part-time work at the [employer] but not since stroke  PATIENT GOALS: Get my strength back up,   OBJECTIVE:  Note: Objective measures were completed at Evaluation unless otherwise noted.  HAND DOMINANCE: Right  ADLs: Overall ADLs: Generally Ind but slower than before Transfers/ambulation related to ADLs: Eating: Ind Grooming: Can't braid hair, apply lashes or do her makeup per PLOF UB Dressing: Ind LB Dressing: Ind Toileting: Ind Bathing: Showers in standing, nothing to hold onto [employer] transfers: Husband present and helps - has garden Warden/ranger: none  IADLs: Just started driving yesterday Shopping: helping but not the endurance with walking Light housekeeping: A Gopal but not like before, hasn't done laundry yet Meal Prep: Not yet Community mobility: using cane Medication management: uses pill box Financial management: Ind Handwriting: Not diffiuclty (R handed)  MOBILITY STATUS: Independent  POSTURE COMMENTS:  No Significant postural limitations Sitting balance: WFL  ACTIVITY TOLERANCE: Activity tolerance: Naps 2x/day - doesn't sleep well at night since the stroke  FUNCTIONAL OUTCOME MEASURES: Quick Dash: 52.3  UPPER EXTREMITY ROM:    Active ROM Right eval Left eval  Shoulder flexion <90* frozen shoulder June Slight end range limits  Shoulder abduction    Shoulder adduction     Shoulder extension    Shoulder internal rotation    Shoulder external rotation    Elbow flexion    Elbow extension    Wrist flexion    Wrist extension    Wrist ulnar deviation    Wrist radial deviation    Wrist pronation    Wrist supination    (Blank rows = not tested)  UPPER EXTREMITY MMT:     MMT Right eval Left eval  Shoulder flexion    Shoulder abduction    Shoulder adduction    Shoulder extension    Shoulder internal rotation    Shoulder external rotation    Middle trapezius    Lower trapezius    Elbow flexion 4+ 3+  Elbow extension 4+ 3+  Wrist flexion    Wrist extension    Wrist ulnar deviation    Wrist radial deviation    Wrist pronation    Wrist supination    (Blank rows = not tested)  HAND FUNCTION: Grip strength: Right: 41.4, 44.0, 43.8  lbs; Left: 31.9, 36.5, 31.5 lbs Average: Right: 43.1 lbs Left: 33.3 lbs  COORDINATION: 9 Hole Peg test: Right: 25.47 sec; Left: 28.56 sec  SENSATION: WFL  EDEMA: NA  MUSCLE TONE: WFL  COGNITION: Overall cognitive status: Within functional limits for tasks assessed  VISION: Subjective report: Noticing Yoshida changes in vision - vision affected when tired, light can make her dizzy when they are too bright Baseline vision: Wears glasses all the time - progressive lenses Visual history: NA  VISION ASSESSMENT: Tracking/Visual pursuits: Decreased smoothness with horizontal tracking and Decreased smoothness with vertical tracking  Patient has difficulty with following activities due to following visual impairments: gets tired with visual changes   OBSERVATIONS: Pt is a tall AA female who ambulated with use of a cane today which she was not using prior to the stroke. No loss of balance but she was slow with ambulation and careful with transition to/from chair. The pt appears well kept and has jacket donned, which she was able to remove herself and don with min assist.   TODAY'S TREATMENT:                                                                                                                                NA eval with no tx on first day per Medicaid guidelines  PATIENT EDUCATION: Education details: OT role and POC Considerations Person educated: Patient Education method: Explanation Education comprehension: verbalized understanding and needs further education  HOME EXERCISE PROGRAM: TBD  GOALS: Goals reviewed with patient? Yes  SHORT TERM GOALS: Target date: 06/28/23  Patient will demonstrate updated LUE HEP with 25% verbal cues or less for proper execution. Baseline: New to outpt OT Goal status: INITIAL  2.  Patient will demonstrate at least 5 lbs improvement with BUE grip strength as needed to open jars and other containers as well as carry objects from the car with LUE. Baseline: Right: 43.1 lbs Left: 33.3 lbs / Moderate Difficulty per QuickDash Goal status: INITIAL  3.  Patient will demo improved FM coordination as evidenced by no difficulty with cutting food on her own.  Baseline: Moderate Difficulty per QuickDash Goal status: INITIAL  4. Patient will be able to safely move in the kitchen without AE for simple meal prep activities x 15+ minutes.  Baseline: Has not resumed cooking since stroke  Goal Status: INITIAL  5. Patient will demonstrate appropriate vision HEP to help with improved visual tracking and decreased tiredness with visual activities.  Baseline: vision affected when tired, light can make her dizzy when they are too bright  Goal Status: INITIAL   LONG TERM GOALS: Target date: 08/02/23  Patient will demonstrate updated LUE HEP with visual handouts only for proper execution. Baseline: New to outpt OT Goal status: INITIAL  2.  Patient will demonstrate increased UE strength, coordination and activity tolerance for overhead activities ie) hair care. Baseline: Subjectively reports  she is unable to braid her hair, apply lashes or do her makeup per PLOF Goal  status: INITIAL  3.  Patient will demonstrate at least 16% improvement with quick Dash score (reporting <35% disability or less) indicating improved functional use of affected extremity. Baseline: Quick Dash: 52.3 Goal status: INITIAL  ASSESSMENT:  CLINICAL IMPRESSION: Patient is a 62 y.o. female who was seen today for occupational therapy evaluation for residual deficits s/ap recent CVA. Hx includes heart defibrillator 08/2022 and newly diagnosed DM2. Patient currently presents slightly below baseline level of function particularly with ambulation as she is needing to use a cane at this time and demonstrates functional deficits and impairments in aspects of ADLs and IADLS with use of LUE. Pt would benefit from skilled OT services in the outpatient setting to work on impairments as noted below to help pt return to PLOF as able.    PERFORMANCE DEFICITS: in functional skills including ADLs, IADLs, coordination, dexterity, proprioception, sensation, ROM, strength, flexibility, Fine motor control, Gross motor control, balance, cardiopulmonary status limiting function, decreased knowledge of precautions, decreased knowledge of use of DME, vision, and UE functional use, cognitive skills including attention, emotional, and problem solving, and psychosocial skills including coping strategies, environmental adaptation, habits, and routines and behaviors.   IMPAIRMENTS: are limiting patient from ADLs, IADLs, rest and sleep, and leisure.   CO-MORBIDITIES: has co-morbidities such as newly dx DM2 and heart failure with defibrillator  that affects occupational performance. Patient will benefit from skilled OT to address above impairments and improve overall function.  MODIFICATION OR ASSISTANCE TO COMPLETE EVALUATION: No modification of tasks or assist necessary to complete an evaluation.  OT OCCUPATIONAL PROFILE AND HISTORY: Problem focused assessment: Including review of records relating to presenting  problem.  CLINICAL DECISION MAKING: LOW - limited treatment options, no task modification necessary  REHAB POTENTIAL: Excellent  EVALUATION COMPLEXITY: Low    PLAN:  OT FREQUENCY: 1x/week  OT DURATION: 6 weeks - POC dates extended for holidays  PLANNED INTERVENTIONS: 97535 self care/ADL training, 40981 therapeutic exercise, 97530 therapeutic activity, 97112 neuromuscular re-education, energy conservation, coping strategies training, patient/family education, and DME and/or AE instructions  RECOMMENDED OTHER SERVICES: Pt missed P.T. evaluation prior to OT today and appt is rescheduled  CONSULTED AND AGREED WITH PLAN OF CARE: Patient  PLAN FOR NEXT SESSION:  Initiate HEPs (putty, coordination and theraband) Standing activities for ADLs Vision HEP  Victorino Sparrow, OT 06/05/2023, 6:18 PM

## 2023-06-20 ENCOUNTER — Ambulatory Visit: Payer: Medicaid Other | Admitting: Physical Therapy

## 2023-06-20 ENCOUNTER — Ambulatory Visit: Payer: Medicaid Other | Admitting: Occupational Therapy

## 2023-06-24 ENCOUNTER — Ambulatory Visit: Payer: Medicaid Other | Admitting: Adult Health

## 2023-06-24 ENCOUNTER — Encounter (HOSPITAL_COMMUNITY): Payer: Self-pay | Admitting: *Deleted

## 2023-06-24 ENCOUNTER — Encounter: Payer: Self-pay | Admitting: Adult Health

## 2023-06-24 VITALS — BP 109/68 | HR 80 | Ht 73.0 in | Wt 207.0 lb

## 2023-06-24 DIAGNOSIS — E1165 Type 2 diabetes mellitus with hyperglycemia: Secondary | ICD-10-CM | POA: Diagnosis not present

## 2023-06-24 DIAGNOSIS — I639 Cerebral infarction, unspecified: Secondary | ICD-10-CM | POA: Diagnosis not present

## 2023-06-24 DIAGNOSIS — Z7984 Long term (current) use of oral hypoglycemic drugs: Secondary | ICD-10-CM | POA: Diagnosis not present

## 2023-06-24 MED ORDER — CLOPIDOGREL BISULFATE 75 MG PO TABS: 75.0000 mg | ORAL_TABLET | Freq: Every day | ORAL | 3 refills | Status: DC

## 2023-06-24 NOTE — Progress Notes (Signed)
[place] Disability forms completed, signed by Dr Gala Romney, and fax to [facility] qat [phone]

## 2023-06-24 NOTE — Progress Notes (Signed)
PATIENT: Lynn Estrada DOB: August 31, 1960  REASON FOR VISIT: follow up HISTORY FROM: patient PRIMARY NEUROLOGIST: Dr. Pearlean Brownie  Chief Complaint  Patient presents with   Follow-up    Pt in 19, h Pt is here for hospital follow up. Pt states she still has some lightheadedness on and off, but feels much better. Pt questions if she needs to continue taking plavix.      HISTORY OF PRESENT ILLNESS: Today 06/24/23  Lynn Estrada is a 62 y.o. 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  Overall she reports that she has been doing well.  Does not have any residual symptoms.  PCP is managing blood pressure, cholesterol and diabetes.  She has not had any additional strokelike symptoms.  She remains on Plavix daily.  Returns today for an evaluation.  HISTORY   Lynn Estrada is a 62 y.o. female with history of MI, hypertension, CHF, COPD, diabetes, CAD, hyperlipidemia, GERD and kidney stones presenting with left-sided weakness, left hemianopia, aphasia and dysarthria.  Symptoms improved before arrival, and patient had residual leg weakness and slight dysarthria.  CTP was negative, but CTA showed questionable distal ACA occlusion.  Patient states she is doing better now but that her speech is not 100% back to normal.  MRI demonstrates small acute infarct in cortex of posterior right cingulate gyrus.    Acute Ischemic Infarct:  small R ACA infarct, etiology: Likely small vessel disease Code Stroke CT head No acute abnormality. ASPECTS 10.    CTA head & neck no LVO but questionable distal ACA A4 occlusion, difficult to determine which side CT perfusion negative for core infarct or penumbra MRI small acute infarct in the cortex of posterior right cingulate gyrus, tiny remote infarct in right cerebellar hemisphere 2D Echo EF 20-25%, improved from < 20% in 12/2022 AICD interrogation negative for atrial fibrillation LDL  60 HgbA1c 7.1 UDS positive for opiates VTE prophylaxis -subcutaneous heparin aspirin 81 mg daily prior to admission, now on aspirin 81 mg daily and clopidogrel 75 mg daily for 3 weeks and then Plavix alone. Therapy recommendations:  Outpatient PT/OT/ST Disposition: Pending   REVIEW OF SYSTEMS: Out of a complete 14 system review of symptoms, the patient complains only of the following symptoms, and all other reviewed systems are negative.  ALLERGIES: Allergies  Allergen Reactions   Latex Rash and Hives   Bactrim [Sulfamethoxazole-Trimethoprim] Rash   Carvedilol Itching   Metoprolol Itching and Rash    HOME MEDICATIONS: Outpatient Medications Prior to Visit  Medication Sig Dispense Refill   acetaminophen (TYLENOL) 500 MG tablet Take 500-1,000 mg by mouth every 6 (six) hours as needed (for pain.).     atorvastatin (LIPITOR) 10 MG tablet Take 10 mg by mouth daily.     calcium carbonate (TUMS - DOSED IN MG ELEMENTAL CALCIUM) 500 MG chewable tablet Chew 1 tablet (200 mg of elemental calcium total) by mouth 2 (two) times daily with a meal. 60 tablet 0   cefadroxil (DURICEF) 500 MG capsule Take 1 capsule (500 mg total) by mouth 2 (two) times daily. 5 capsule 0   clopidogrel (PLAVIX) 75 MG tablet Take 1 tablet (75 mg total) by mouth daily. 30 tablet 0   digoxin (LANOXIN) 0.125 MG tablet TAKE 1 TABLET BY MOUTH EVERY DAY 90 tablet 3   empagliflozin (JARDIANCE) 10 MG TABS tablet Take 1 tablet (10 mg total) by mouth daily.     furosemide (  LASIX) 40 MG tablet Take 40 mg by mouth daily.     lidocaine (LIDODERM) 5 % Place 1 patch onto the skin daily. Remove & Discard patch within 12 hours or as directed by MD 5 patch 0   lidocaine (LMX) 4 % cream Apply topically 4 (four) times daily as needed (Left rib pain). 30 g 0   metFORMIN (GLUCOPHAGE) 500 MG tablet Take 1 tablet (500 mg total) by mouth 2 (two) times daily with a meal. 60 tablet 0   Multiple Vitamin (MULTIVITAMIN WITH MINERALS) TABS tablet Take  1 tablet by mouth daily with lunch. One A Day for Women 50+     sacubitril-valsartan (ENTRESTO) 24-26 MG TAKE 1 TABLET BY MOUTH TWICE A DAY 60 tablet 2   No facility-administered medications prior to visit.    PAST MEDICAL HISTORY: Past Medical History:  Diagnosis Date   Anemia    Arthritis    CHF (congestive heart failure) (HCC) 12/06/2022   EF is 20% to 25 %   COPD (chronic obstructive pulmonary disease) (HCC)    Diabetes mellitus without complication (HCC)    Eczema    GERD (gastroesophageal reflux disease)    History of adenomatous polyp of colon    08-03-2016  tubular adenoma x2 and hyperplastic   History of chronic gastritis 08/03/2016   History of ectopic pregnancy 1988   s/p  left salpingectomy   History of endometriosis    History of kidney stones    History of partial nephrectomy    right pelvis for very large stone   History of pneumonia 07/02/2016   CAP   History of sepsis    07-01-2016  sepsis w/ pyelonephritis, CAP /   12-31-2016  urosepsis w/ kidney stone obstruction   History of uterine leiomyoma    Hx of adenomatous colonic polyps 08/03/2016   2 adenomas, 1 hpp and 1 lost polyp 2018 all < 1 cm    Hyperlipidemia    Hypertension    Left ureteral stone    Myocardial infarction [facility])    Nephrolithiasis    left obstructive stone and right non-obstructive stone  per CT 12-31-2016   Urgency of urination     PAST SURGICAL HISTORY: Past Surgical History:  Procedure Laterality Date   ABDOMINAL HYSTERECTOMY  01/20/2007   w/ Lysis Adhesions/  Left salpingoophorectomy/  Right Salpingectomy   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   Procedure: BIV ICD INSERTION CRT-D;  Surgeon: Maurice Small, MD;  Location: [facility];  Service: Cardiovascular;  Laterality: N/A;   CHOLECYSTECTOMY  1994   and Right Partial Nephrectomy (pelvis for very large stone)   COLONOSCOPY WITH PROPOFOL N/A 03/26/2023   Procedure: COLONOSCOPY WITH PROPOFOL;  Surgeon: Iva Boop, MD;  Location: [facility];  Service: Gastroenterology;  Laterality: N/A;   CYSTOSCOPY W/ URETERAL STENT PLACEMENT Left 12/31/2016   Procedure: CYSTOSCOPY WITH RETROGRADE PYELOGRAM/ LEFT URETERAL STENT PLACEMENT;  Surgeon: Sebastian Ache, MD;  Location: [facility];  Service: Urology;  Laterality: Left;   CYSTOSCOPY WITH RETROGRADE PYELOGRAM, URETEROSCOPY AND STENT PLACEMENT Left 01/18/2017   Procedure: 1ST STAGE CYSTOSCOPY WITH RETROGRADE PYELOGRAM, URETEROSCOPY AND STENT REPLACEMENT;  Surgeon: Sebastian Ache, MD;  Location: [facility];  Service: Urology;  Laterality: Left;   CYSTOSCOPY WITH RETROGRADE PYELOGRAM, URETEROSCOPY AND STENT PLACEMENT Left 02/01/2017   Procedure: 2ND STAGE CYSTOSCOPY WITH RETROGRADE PYELOGRAM, URETEROSCOPY AND STENT REPLACEMENT;  Surgeon: Sebastian Ache, MD;  Location: [facility];  Service: Urology;  Laterality: Left;   ECTOPIC PREGNANCY SURGERY  1988   Left Salpingectomy   HOLMIUM LASER APPLICATION Left 01/18/2017   Procedure: HOLMIUM LASER APPLICATION;  Surgeon: Sebastian Ache, MD;  Location: [facility];  Service: Urology;  Laterality: Left;   HOLMIUM LASER APPLICATION Left 02/01/2017   Procedure: HOLMIUM LASER APPLICATION;  Surgeon: Sebastian Ache, MD;  Location: [facility];  Service: Urology;  Laterality: Left;   LUMBAR DISC SURGERY  01/1999   right L5 -- S1   POLYPECTOMY  03/26/2023   Procedure: POLYPECTOMY;  Surgeon: Iva Boop, MD;  Location: [facility];  Service: Gastroenterology;;   RE-EXPLORATION LUMBAR/  LAMINECTOMY AND MICRODISKECTOMY  10/14/2001   right L5 -- S1   RIGHT HEART CATH N/A 05/23/2022   Procedure: RIGHT HEART CATH;  Surgeon: Dolores Patty, MD;  [facility]: [facility];  Service: Cardiovascular;  Laterality: N/A;   RIGHT/LEFT HEART CATH AND CORONARY ANGIOGRAPHY N/A 07/18/2021   Procedure: RIGHT/LEFT HEART CATH AND CORONARY ANGIOGRAPHY;  Surgeon: Dolores Patty, MD;  Location: [facility];  Service: Cardiovascular;  Laterality: N/A;   TUBAL LIGATION Bilateral 10/17/1999   PPTL   UMBILICAL HERNIA REPAIR  age 73    FAMILY HISTORY: Family History  Problem Relation Age of Onset   Sarcoidosis Mother    Prostate cancer Father    Hypertension Sister    Eczema Sister    Lupus Sister    Breast cancer Maternal Aunt    Gout Maternal Grandmother    Diabetes Maternal Grandmother        leg amputations   Stomach cancer Neg Hx    Colon cancer Neg Hx    Esophageal cancer Neg Hx    Rectal cancer Neg Hx     SOCIAL HISTORY: Social History   Socioeconomic History   Marital status: Married    Spouse name: Richard Lipuma   Number of children: 1   Years of education: college   Highest education level: Not on file  Occupational History   Occupation: Product manager  Tobacco Use   Smoking status: Former    Current packs/day: 0.00    Average packs/day: 0.8 packs/day for 5.0 years (3.8 ttl pk-yrs)    Types: Cigarettes    Start date: 01/11/1992    Quit date: 01/10/1997    Years since quitting: 26.4   Smokeless tobacco: Never  Vaping Use   Vaping status: Never Used  Substance and Sexual Activity   Alcohol use: Not Currently    Comment: social use; once a month   Drug use: No   Sexual activity: Yes    Partners: Male    Birth control/protection: Surgical  Other Topics Concern   Not on file  Social History Narrative   Lives with her husband and their daughter and their dog.   Social Drivers of Corporate investment banker Strain: Low Risk  (08/20/2022)   Received from [facility], [facility]   Overall Financial Resource Strain (CARDIA)    Difficulty of Paying Living Expenses: Not very hard  Food Insecurity: No Food Insecurity (05/24/2023)   Hunger Vital Sign    Worried About Running Out of Food in the Last Year: Never true    Ran Out of Food in the Last Year: Never true  Transportation Needs: No Transportation Needs  (05/24/2023)   PRAPARE - Administrator, Civil Service (Medical): No    Lack of Transportation (Non-Medical): No  Physical Activity: Insufficiently  Active (08/20/2022)   Received from [facility], [facility]   Exercise Vital Sign    Days of Exercise per Week: 3 days    Minutes of Exercise per Session: 30 min  Stress: No Stress Concern Present (08/20/2022)   Received from [facility], [facility] - Occupational Stress Questionnaire    Feeling of Stress : Only a [place]  Social Connections: Socially Integrated (08/20/2022)   Received from [facility], [facility]   Social Network    How would you rate your social network (family, work, friends)?: Good participation with social networks  Intimate Partner Violence: Not At Risk (05/24/2023)   Humiliation, Afraid, Rape, and Kick questionnaire    Fear of Current or Ex-Partner: No    Emotionally Abused: No    Physically Abused: No    Sexually Abused: No      PHYSICAL EXAM  Vitals:   06/24/23 0940  BP: 109/68  Pulse: 80  Weight: 207 lb (93.9 kg)  Height: 6\' 1"  (1.854 m)   Body mass index is 27.31 kg/m.  Generalized: Well developed, in no acute distress   Neurological examination  Mentation: Alert oriented to time, place, history taking. Follows all commands speech and language fluent Cranial nerve II-XII: Pupils were equal round reactive to light. Extraocular movements were full, visual field were full on confrontational test. Facial sensation and strength were normal. Uvula tongue midline. Head turning and shoulder shrug  were normal and symmetric. Motor: The motor testing reveals 5 over 5 strength of all 4 extremities. Good symmetric motor tone is noted throughout.  Sensory: Sensory testing is intact to soft touch on all 4 extremities. No evidence of extinction is noted.  Coordination: Cerebellar testing reveals good finger-nose-finger and heel-to-shin bilaterally.   Gait and station: Gait is normal. Tandem gait is normal. Romberg is negative. No drift is seen.  Reflexes: Deep tendon reflexes are symmetric and normal bilaterally.   DIAGNOSTIC DATA (LABS, IMAGING, TESTING) - I reviewed patient records, labs, notes, testing and imaging myself where available.  Lab Results  Component Value Date   WBC 6.3 05/27/2023   HGB 10.7 (L) 05/27/2023   HCT 33.5 (L) 05/27/2023   MCV 91.0 05/27/2023   PLT 213 05/27/2023      Component Value Date/Time   NA 136 05/26/2023 0430   NA 141 12/21/2022 0908   K 4.1 05/26/2023 0430   CL 106 05/26/2023 0430   CO2 22 05/26/2023 0430   GLUCOSE 119 (H) 05/26/2023 0430   BUN 18 05/26/2023 0430   BUN 20 12/21/2022 0908   CREATININE 1.13 (H) 05/26/2023 0430   CREATININE 0.76 12/07/2015 1239   CALCIUM 8.9 05/26/2023 0430   PROT 7.4 05/24/2023 1256   PROT 6.9 07/10/2016 1213   ALBUMIN 3.6 05/24/2023 1256   ALBUMIN 3.9 07/10/2016 1213   AST 17 05/24/2023 1256   ALT 20 05/24/2023 1256   ALKPHOS 53 05/24/2023 1256   BILITOT 0.5 05/24/2023 1256   BILITOT 0.3 07/10/2016 1213   GFRNONAA 55 (L) 05/26/2023 0430   GFRNONAA >89 12/07/2015 1239   GFRAA 55 (L) 06/29/2019 0014   GFRAA >89 12/07/2015 1239   Lab Results  Component Value Date   CHOL 130 05/24/2023   HDL 27 (L) 05/24/2023   LDLCALC 60 05/24/2023   TRIG 217 (H) 05/24/2023   CHOLHDL 4.8 05/24/2023   Lab Results  Component Value Date   HGBA1C 7.1 (H) 05/24/2023   Lab Results  Component  Value Date   VITAMINB12 427 07/14/2021   Lab Results  Component Value Date   TSH 0.642 07/14/2021      ASSESSMENT AND PLAN 63 y.o. year old female  has a past medical history of Anemia, Arthritis, CHF (congestive heart failure) (HCC) (12/06/2022), COPD (chronic obstructive pulmonary disease) (HCC), Diabetes mellitus without complication (HCC), Eczema, GERD (gastroesophageal reflux disease), History of adenomatous polyp of colon, History of chronic gastritis  (08/03/2016), History of ectopic pregnancy (1988), History of endometriosis, History of kidney stones, History of partial nephrectomy, History of pneumonia (07/02/2016), History of sepsis, History of uterine leiomyoma, adenomatous colonic polyps (08/03/2016), Hyperlipidemia, Hypertension, Left ureteral stone, Myocardial infarction (HCC), Nephrolithiasis, and Urgency of urination. here with:  Acute Ischemic Infarct:  small R ACA infarct, etiology: Likely small vessel disease    Continue clopidogrel 75 mg daily  for secondary stroke prevention.   Discussed secondary stroke prevention measures and importance of close PCP follow up for aggressive stroke risk factor management. I have gone over the pathophysiology of stroke, warning signs and symptoms, risk factors and their management in some detail with instructions to go to the closest emergency room for symptoms of concern. HTN: BP goal <130/90.  HLD: LDL goal <70. Recent LDL 60.  DMII: A1c goal<7.0. Recent A1c 7.1.  Encouraged patient to monitor diet and encouraged exercise FU with our office PRN      Butch Penny, MSN, NP-C 06/24/2023, 9:30 AM [facility] [address] [place], [place] [address] [phone]

## 2023-06-24 NOTE — Patient Instructions (Signed)
Your Plan:  Continue plavix 75 mg daily   Blood pressure goal <130/90 Cholesterol LDL goal <70 Diabetes goal A1c <7 Monitor diet and try to exercise   Thank you for coming to see [place] at [facility]. I hope we have been able to provide you high quality care today.  You may receive a patient satisfaction survey over the next few weeks. We would appreciate your feedback and comments so that we may continue to improve ourselves and the health of our patients.

## 2023-06-25 ENCOUNTER — Other Ambulatory Visit: Payer: Self-pay | Admitting: Student

## 2023-06-25 ENCOUNTER — Other Ambulatory Visit: Payer: Self-pay

## 2023-06-28 ENCOUNTER — Other Ambulatory Visit: Payer: Self-pay

## 2023-07-02 ENCOUNTER — Ambulatory Visit: Payer: Medicaid Other | Attending: Cardiology

## 2023-07-02 DIAGNOSIS — I5042 Chronic combined systolic (congestive) and diastolic (congestive) heart failure: Secondary | ICD-10-CM

## 2023-07-02 LAB — CUP PACEART INCLINIC DEVICE CHECK
Date Time Interrogation Session: 20241231121708
Implantable Lead Connection Status: 753985
Implantable Lead Connection Status: 753985
Implantable Lead Connection Status: 753985
Implantable Lead Implant Date: 20240715
Implantable Lead Implant Date: 20240715
Implantable Lead Implant Date: 20240715
Implantable Lead Location: 753858
Implantable Lead Location: 753859
Implantable Lead Location: 753860
Implantable Lead Model: 7122
Implantable Pulse Generator Implant Date: 20240715
Pulse Gen Serial Number: 211015851

## 2023-07-02 NOTE — Progress Notes (Signed)
 Patient seen in device clinic for LV output greater than programmed output. LV threshold checked, was 3.5V @ 0.31ms. Programmed LV lead to 3.5V @ 0.74ms allowing 2:1 safety margin. Cap confirm programmed from on to monitor.

## 2023-07-08 ENCOUNTER — Encounter: Payer: Self-pay | Admitting: Physical Therapy

## 2023-07-08 ENCOUNTER — Ambulatory Visit: Payer: Medicaid Other | Admitting: Occupational Therapy

## 2023-07-08 ENCOUNTER — Ambulatory Visit: Payer: Medicaid Other | Attending: Internal Medicine | Admitting: Physical Therapy

## 2023-07-08 VITALS — BP 109/63 | HR 82

## 2023-07-08 DIAGNOSIS — R208 Other disturbances of skin sensation: Secondary | ICD-10-CM | POA: Diagnosis present

## 2023-07-08 DIAGNOSIS — I639 Cerebral infarction, unspecified: Secondary | ICD-10-CM | POA: Diagnosis not present

## 2023-07-08 DIAGNOSIS — M25511 Pain in right shoulder: Secondary | ICD-10-CM | POA: Insufficient documentation

## 2023-07-08 DIAGNOSIS — R29818 Other symptoms and signs involving the nervous system: Secondary | ICD-10-CM | POA: Insufficient documentation

## 2023-07-08 DIAGNOSIS — R278 Other lack of coordination: Secondary | ICD-10-CM | POA: Diagnosis present

## 2023-07-08 DIAGNOSIS — G8929 Other chronic pain: Secondary | ICD-10-CM | POA: Diagnosis present

## 2023-07-08 DIAGNOSIS — M6281 Muscle weakness (generalized): Secondary | ICD-10-CM

## 2023-07-08 DIAGNOSIS — R2681 Unsteadiness on feet: Secondary | ICD-10-CM | POA: Diagnosis present

## 2023-07-08 DIAGNOSIS — R29898 Other symptoms and signs involving the musculoskeletal system: Secondary | ICD-10-CM | POA: Insufficient documentation

## 2023-07-08 NOTE — Patient Instructions (Signed)
 Lynn Estrada

## 2023-07-08 NOTE — Therapy (Signed)
 OUTPATIENT PHYSICAL THERAPY NEURO EVALUATION   Patient Name: Lynn Estrada MRN: [id number] DOB:August 09, 1960, 63 y.o., female Today's Date: 07/08/2023   PCP:  Juliane Che, PA   REFERRING PROVIDER: Forest Coy, MD  END OF SESSION:  PT End of Session - 07/08/23 1020     Visit Number 1    Number of Visits 7    Date for PT Re-Evaluation 09/06/23    Authorization Type [place] MEDICAID [employer]LL[employer]    PT Start Time 1018    PT Stop Time 1057    PT Time Calculation (min) 39 min    Equipment Utilized During Treatment Gait belt    Activity Tolerance Patient tolerated treatment well    Behavior During Therapy WFL for tasks assessed/performed             Past Medical History:  Diagnosis Date   Anemia    Arthritis    CHF (congestive heart failure) (HCC) 12/06/2022   EF is 20% to 25 %   COPD (chronic obstructive pulmonary disease) (HCC)    Diabetes mellitus without complication (HCC)    Eczema    GERD (gastroesophageal reflux disease)    History of adenomatous polyp of colon    08-03-2016  tubular adenoma x2 and hyperplastic   History of chronic gastritis 08/03/2016   History of ectopic pregnancy 1988   s/p  left salpingectomy   History of endometriosis    History of kidney stones    History of partial nephrectomy    right pelvis for very large stone   History of pneumonia 07/02/2016   CAP   History of sepsis    07-01-2016  sepsis w/ pyelonephritis, CAP /   12-31-2016  urosepsis w/ kidney stone obstruction   History of uterine leiomyoma    Hx of adenomatous colonic polyps 08/03/2016   2 adenomas, 1 hpp and 1 lost polyp 2018 all < 1 cm    Hyperlipidemia    Hypertension    Left ureteral stone    Myocardial infarction [facility])    Nephrolithiasis    left obstructive stone and right non-obstructive stone  per CT 12-31-2016   Urgency of urination    Past Surgical History:  Procedure Laterality Date   ABDOMINAL HYSTERECTOMY  01/20/2007   w/ Lysis Adhesions/  Left  salpingoophorectomy/  Right Salpingectomy   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   Procedure: BIV ICD INSERTION CRT-D;  Surgeon: Nancey Eulas BRAVO, MD;  Location: [facility];  Service: Cardiovascular;  Laterality: N/A;   CHOLECYSTECTOMY  1994   and Right Partial Nephrectomy (pelvis for very large stone)   COLONOSCOPY WITH PROPOFOL  N/A 03/26/2023   Procedure: COLONOSCOPY WITH PROPOFOL ;  Surgeon: Avram Lupita BRAVO, MD;  Location: [facility];  Service: Gastroenterology;  Laterality: N/A;   CYSTOSCOPY W/ URETERAL STENT PLACEMENT Left 12/31/2016   Procedure: CYSTOSCOPY WITH RETROGRADE PYELOGRAM/ LEFT URETERAL STENT PLACEMENT;  Surgeon: Alvaro Hummer, MD;  Location: [facility];  Service: Urology;  Laterality: Left;   CYSTOSCOPY WITH RETROGRADE PYELOGRAM, URETEROSCOPY AND STENT PLACEMENT Left 01/18/2017   Procedure: 1ST STAGE CYSTOSCOPY WITH RETROGRADE PYELOGRAM, URETEROSCOPY AND STENT REPLACEMENT;  Surgeon: Alvaro Hummer, MD;  Location: [facility];  Service: Urology;  Laterality: Left;   CYSTOSCOPY WITH RETROGRADE PYELOGRAM, URETEROSCOPY AND STENT PLACEMENT Left 02/01/2017   Procedure: 2ND STAGE CYSTOSCOPY WITH RETROGRADE PYELOGRAM, URETEROSCOPY AND STENT REPLACEMENT;  Surgeon: Alvaro Hummer, MD;  Location: [facility];  Service: Urology;  Laterality: Left;   ECTOPIC PREGNANCY SURGERY  1988   Left Salpingectomy   HOLMIUM LASER APPLICATION Left 01/18/2017   Procedure: HOLMIUM LASER APPLICATION;  Surgeon: Alvaro Hummer, MD;  Location: [facility];  Service: Urology;  Laterality: Left;   HOLMIUM LASER APPLICATION Left 02/01/2017   Procedure: HOLMIUM LASER APPLICATION;  Surgeon: Alvaro Hummer, MD;  Location: [facility];  Service: Urology;  Laterality: Left;   LUMBAR DISC SURGERY  01/1999   right L5 -- S1   POLYPECTOMY  03/26/2023   Procedure: POLYPECTOMY;  Surgeon: Avram Lupita BRAVO, MD;  Location: [facility];  Service:  Gastroenterology;;   RE-EXPLORATION LUMBAR/  LAMINECTOMY AND MICRODISKECTOMY  10/14/2001   right L5 -- S1   RIGHT HEART CATH N/A 05/23/2022   Procedure: RIGHT HEART CATH;  Surgeon: Cherrie Toribio SAUNDERS, MD;  Location: [facility];  Service: Cardiovascular;  Laterality: N/A;   RIGHT/LEFT HEART CATH AND CORONARY ANGIOGRAPHY N/A 07/18/2021   Procedure: RIGHT/LEFT HEART CATH AND CORONARY ANGIOGRAPHY;  Surgeon: Cherrie Toribio SAUNDERS, MD;  Location: [facility];  Service: Cardiovascular;  Laterality: N/A;   TUBAL LIGATION Bilateral 10/17/1999   PPTL   UMBILICAL HERNIA REPAIR  age 34   Patient Active Problem List   Diagnosis Date Noted   Cerebrovascular accident (CVA) (HCC) 05/26/2023   Pyuria 05/25/2023   Left-sided weakness 05/24/2023   Benign neoplasm of cecum 03/26/2023   Benign neoplasm of descending colon 03/26/2023   Benign neoplasm of sigmoid colon 03/26/2023   Adhesive capsulitis of right shoulder 08/24/2022   Mass of right hand 02/22/2022   LBBB (left bundle branch block) 02/01/2022   Type 2 diabetes mellitus with hyperglycemia, without long-term current use of insulin  (HCC) 01/22/2022   Hypertriglyceridemia 01/21/2022   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 10/26/2021   Chronic combined systolic and diastolic heart failure (HCC) 07/27/2021   Acute combined systolic (congestive) and diastolic (congestive) heart failure (HCC)    Hypocalcemia 07/12/2021   Elevated troponin 07/12/2021   Lipoma of hand 07/20/2020   Osteoarthritis of carpometacarpal (CMC) joint of thumb 07/20/2020   Pain in right hand 07/20/2020   Radial styloid tenosynovitis 07/20/2020   Other osteoarthritis of spine 10/23/2019   Suspected COVID-19 virus infection 06/11/2019   Acute respiratory failure with hypoxia (HCC) 06/11/2019   Multifocal pneumonia 06/11/2019   Carpal tunnel syndrome, bilateral 04/24/2019   Ureteral stone with hydronephrosis 12/31/2016   Hx of adenomatous  colonic polyps 08/03/2016   Normocytic anemia 07/10/2016   Essential hypertension 07/26/2015   Eczema 07/26/2015   Overweight (BMI 25.0-29.9) 07/26/2015    ONSET DATE: 05/27/2023  REFERRING DIAG: I63.9 (ICD-10-CM) - Cerebrovascular accident (CVA), unspecified mechanism (HCC)  THERAPY DIAG:  Muscle weakness (generalized)  Other symptoms and signs involving the nervous system  Unsteadiness on feet  Rationale for Evaluation and Treatment: Rehabilitation  SUBJECTIVE:  SUBJECTIVE STATEMENT:  GOES BY [employer]  Feels a Rosa sluggish, trying to get her endurance back up. Trying to go back to the gym. Recently went to the [facility] an did 40 minutes of walking. Felt tired afterwards. Feels a [place] off balance, walks with a cane if she knows she is gonna be out for a long period of time. Will use  cart when getting inside of a store. Have not had any falls, is fearful of falling. Pt denies weakness in her legs. Before her CVA, did not have to use a cane   Pt accompanied by: self  PERTINENT HISTORY: Pt admitted 05/24/23 with Lt sided weakness and slurred speech which resolved. 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 PMH: HTN, gastritis, GERD, CHF, COPD, DM, CAD, HLD   Heart failure w/defibrillator   PAIN:  Are you having pain? Yes: NPRS scale: 5/10 Pain location: R shoulder  Pain description: Soreness  Aggravating factors: Can't lift it up all the way Relieving factors: Not laying on it   Vitals:   07/08/23 1036  BP: 109/63  Pulse: 82     PRECAUTIONS: ICD/Pacemaker  RED FLAGS: None   WEIGHT BEARING RESTRICTIONS: No  FALLS: Has patient fallen in last 6 months? No  LIVING ENVIRONMENT: Lives with: lives with their spouse and and  her daughter  Lives in: House/apartment Stairs: No - 1 story ranch style  Has following equipment at home: Single point cane and Environmental Consultant - 2 wheeled  PLOF: Independent and Vocation/Vocational requirements: Works part time as an Ship Broker at [employer]   PATIENT GOALS: Wants to strengthen her mobility in her arms and her legs   OBJECTIVE:  Note: Objective measures were completed at Evaluation unless otherwise noted.  DIAGNOSTIC FINDINGS: MRI brain 05/24/23: IMPRESSION: 1. Small acute infarct in the cortex of the posterior right cingulate gyrus (right ACA territory). 2. Tiny remote infarct in the right cerebellar hemisphere. 3. Scattered foci of T2 hyperintensity within the white matter of the cerebral hemispheres, nonspecific but may represent moderate chronic microvascular ischemic changes.    COGNITION: Overall cognitive status: Within functional limits for tasks assessed   SENSATION: WFL  COORDINATION: Heel to shin: slower and more difficult to perform with LLE     LOWER EXTREMITY MMT:    MMT Right Eval Left Eval  Hip flexion 5 3-  Hip extension    Hip abduction 4+ 4  Hip adduction 4+ 4  Hip internal rotation    Hip external rotation    Knee flexion 5 4  Knee extension 5 4  Ankle dorsiflexion 5 5  Ankle plantarflexion    Ankle inversion    Ankle eversion    (Blank rows = not tested)  BED MOBILITY:  Pt reports no difficulties   TRANSFERS: Assistive device utilized: None  Sit to stand: SBA Stand to sit: SBA Needs to use BUE support to stand from chair   STAIRS: Level of Assistance: SBA Stair Negotiation Technique: Alternating Pattern  with Bilateral Rails Number of Stairs: 4  Height of Stairs: 6   GAIT: Gait pattern: step through pattern and decreased stride length Distance walked: Clinic distances  Assistive device utilized: None Level of assistance: SBA Comments: Pt reports having to use a cane out in the community for longer distances, pt  not using a cane today.   FUNCTIONAL TESTS:  5 times sit to stand: 19.1 seconds with BUE support  10 meter walk  test: 9.1 seconds = 3.6 ft/sec   [facility] PT Assessment - 07/08/23 1050       Functional Gait  Assessment   Gait assessed  Yes    Gait Level Surface Walks 20 ft in less than 5.5 sec, no assistive devices, good speed, no evidence for imbalance, normal gait pattern, deviates no more than 6 in outside of the 12 in walkway width.   5.3   Change in Gait Speed Able to smoothly change walking speed without loss of balance or gait deviation. Deviate no more than 6 in outside of the 12 in walkway width.    Gait with Horizontal Head Turns Performs head turns smoothly with slight change in gait velocity (eg, minor disruption to smooth gait path), deviates 6-10 in outside 12 in walkway width, or uses an assistive device.    Gait with Vertical Head Turns Performs task with slight change in gait velocity (eg, minor disruption to smooth gait path), deviates 6 - 10 in outside 12 in walkway width or uses assistive device    Gait and Pivot Turn Pivot turns safely within 3 sec and stops quickly with no loss of balance.    Step Over Obstacle Is able to step over 2 stacked shoe boxes taped together (9 in total height) without changing gait speed. No evidence of imbalance.    Gait with Narrow Base of Support Is able to ambulate for 10 steps heel to toe with no staggering.    Gait with Eyes Closed Walks 20 ft, slow speed, abnormal gait pattern, evidence for imbalance, deviates 10-15 in outside 12 in walkway width. Requires more than 9 sec to ambulate 20 ft.   9.1 seconds   Ambulating Backwards Walks 20 ft, uses assistive device, slower speed, mild gait deviations, deviates 6-10 in outside 12 in walkway width.    Steps Alternating feet, must use rail.    Total Score 24    FGA comment: 24/30 = Medium Fall Risk             TODAY'S TREATMENT:                                                                                                                               N/A during eval.    PATIENT EDUCATION: Education details: Clinical findings, POC, Medicaid visit limit, verbally added sit <> stands with LLE posteriorly to HEP for strengthening  Person educated: Patient Education method: Explanation, Demonstration, and Verbal cues Education comprehension: verbalized understanding, returned demonstration, and needs further education  HOME EXERCISE PROGRAM: Staggered stance sit <> stand with LLE posteriorly   GOALS: Goals reviewed with patient? Yes  SHORT TERM GOALS: ALL STGS = LTGS  LONG TERM GOALS: Target date: 08/05/2023  Pt will be independent with final HEP for strength/balance in order to build upon functional gains made in therapy. Baseline: No HEP Goal status: INITIAL  2.  to be assessed with goal written.  Baseline:  Not yet assessed  Goal status: INITIAL  3.  Pt will improve FGA to at least a 27/30 in order to demo decr fall risk.  Baseline: 24/30 Goal status: INITIAL  4.  Pt will improve 5x sit<>stand to less than or equal to 16.5 sec to demonstrate improved functional strength and transfer efficiency.   Baseline: 19.1 seconds with BUE support  Goal status: INITIAL    ASSESSMENT:  CLINICAL IMPRESSION: Patient is a 63 year old female referred to Neuro OPPT for s/p recent CVA.   Pt's PMH is significant for: HTN, gastritis, GERD, CHF, COPD, DM, CAD, HLD, heart defibrillator 08/2022 . The following deficits were present during the exam: impaired balance, gait abnormalities, decr strength, decr endurance/activity tolerance. Based on FGA and 5x sit <> stand, pt is an incr risk for falls. Pt would benefit from skilled PT to address these impairments and functional limitations to maximize functional mobility independence and decr fall risk.    OBJECTIVE IMPAIRMENTS: Abnormal gait, decreased activity tolerance, decreased balance, decreased coordination, decreased mobility,  difficulty walking, decreased strength, and pain.   ACTIVITY LIMITATIONS: lifting, stairs, transfers, and locomotion level  PARTICIPATION LIMITATIONS: community activity  PERSONAL FACTORS: Age, Behavior pattern, Past/current experiences, Time since onset of injury/illness/exacerbation, and 3+ comorbidities: HTN, gastritis, GERD, CHF, COPD, DM, CAD, HLD   are also affecting patient's functional outcome.   REHAB POTENTIAL: Good  CLINICAL DECISION MAKING: Stable/uncomplicated  EVALUATION COMPLEXITY: Low  PLAN:  PT FREQUENCY: 1x/week  PT DURATION: 8 weeks  PLANNED INTERVENTIONS: 97164- PT Re-evaluation, 97110-Therapeutic exercises, 97530- Therapeutic activity, 97112- Neuromuscular re-education, 97535- Self Care, [id number]- Manual therapy, [phone]- Gait training, Patient/Family education, Balance training, Stair training, Vestibular training, and DME instructions  PLAN FOR NEXT SESSION: Assess and write goal. Initiate HEP for LLE strengthening and balance    [place]ffield LOISE Senate, PT, DPT 07/08/2023, 11:05 AM    Check all possible CPT codes: 97164 - PT Re-evaluation, 97110- Therapeutic Exercise, [phone]- Neuro Re-education, [phone] - Gait Training, [phone] - Manual Therapy, [phone] - Therapeutic Activities, and [phone] - Self Care    Check all conditions that are expected to impact treatment: Diabetes mellitus and Neurological condition and/or seizures   If treatment provided at initial evaluation, no treatment charged due to lack of authorization.

## 2023-07-08 NOTE — Therapy (Signed)
 OUTPATIENT OCCUPATIONAL THERAPY NEURO TREATMENT  Patient Name: Lynn Estrada MRN: [id number] DOB:Oct 02, 1960, 63 y.o., female Today's Date: 07/08/2023  PCP: Juliane Che, PA REFERRING PROVIDER: Forest Coy, MD  END OF SESSION:  OT End of Session - 07/08/23 1101     Visit Number 2    Number of Visits 6    Date for OT Re-Evaluation 08/02/23    Authorization Type [employer]    OT Start Time 1100    OT Stop Time 1145    OT Time Calculation (min) 45 min    Equipment Utilized During Treatment putty    Activity Tolerance Patient tolerated treatment well    Behavior During Therapy WFL for tasks assessed/performed             Past Medical History:  Diagnosis Date   Anemia    Arthritis    CHF (congestive heart failure) (HCC) 12/06/2022   EF is 20% to 25 %   COPD (chronic obstructive pulmonary disease) (HCC)    Diabetes mellitus without complication (HCC)    Eczema    GERD (gastroesophageal reflux disease)    History of adenomatous polyp of colon    08-03-2016  tubular adenoma x2 and hyperplastic   History of chronic gastritis 08/03/2016   History of ectopic pregnancy 1988   s/p  left salpingectomy   History of endometriosis    History of kidney stones    History of partial nephrectomy    right pelvis for very large stone   History of pneumonia 07/02/2016   CAP   History of sepsis    07-01-2016  sepsis w/ pyelonephritis, CAP /   12-31-2016  urosepsis w/ kidney stone obstruction   History of uterine leiomyoma    Hx of adenomatous colonic polyps 08/03/2016   2 adenomas, 1 hpp and 1 lost polyp 2018 all < 1 cm    Hyperlipidemia    Hypertension    Left ureteral stone    Myocardial infarction [facility])    Nephrolithiasis    left obstructive stone and right non-obstructive stone  per CT 12-31-2016   Urgency of urination    Past Surgical History:  Procedure Laterality Date   ABDOMINAL HYSTERECTOMY  01/20/2007   w/ Lysis Adhesions/  Left  salpingoophorectomy/  Right Salpingectomy   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   Procedure: BIV ICD INSERTION CRT-D;  Surgeon: Nancey Eulas BRAVO, MD;  Location: [facility];  Service: Cardiovascular;  Laterality: N/A;   CHOLECYSTECTOMY  1994   and Right Partial Nephrectomy (pelvis for very large stone)   COLONOSCOPY WITH PROPOFOL  N/A 03/26/2023   Procedure: COLONOSCOPY WITH PROPOFOL ;  Surgeon: Avram Lupita BRAVO, MD;  Location: [facility];  Service: Gastroenterology;  Laterality: N/A;   CYSTOSCOPY W/ URETERAL STENT PLACEMENT Left 12/31/2016   Procedure: CYSTOSCOPY WITH RETROGRADE PYELOGRAM/ LEFT URETERAL STENT PLACEMENT;  Surgeon: Alvaro Hummer, MD;  Location: [facility];  Service: Urology;  Laterality: Left;   CYSTOSCOPY WITH RETROGRADE PYELOGRAM, URETEROSCOPY AND STENT PLACEMENT Left 01/18/2017   Procedure: 1ST STAGE CYSTOSCOPY WITH RETROGRADE PYELOGRAM, URETEROSCOPY AND STENT REPLACEMENT;  Surgeon: Alvaro Hummer, MD;  Location: [facility];  Service: Urology;  Laterality: Left;   CYSTOSCOPY WITH RETROGRADE PYELOGRAM, URETEROSCOPY AND STENT PLACEMENT Left 02/01/2017   Procedure: 2ND STAGE CYSTOSCOPY WITH RETROGRADE PYELOGRAM, URETEROSCOPY AND STENT REPLACEMENT;  Surgeon: Alvaro Hummer, MD;  Location: [facility];  Service: Urology;  Laterality: Left;   ECTOPIC PREGNANCY SURGERY  1988   Left Salpingectomy  HOLMIUM LASER APPLICATION Left 01/18/2017   Procedure: HOLMIUM LASER APPLICATION;  Surgeon: Alvaro Hummer, MD;  Location: [facility];  Service: Urology;  Laterality: Left;   HOLMIUM LASER APPLICATION Left 02/01/2017   Procedure: HOLMIUM LASER APPLICATION;  Surgeon: Alvaro Hummer, MD;  Location: [facility];  Service: Urology;  Laterality: Left;   LUMBAR DISC SURGERY  01/1999   right L5 -- S1   POLYPECTOMY  03/26/2023   Procedure: POLYPECTOMY;  Surgeon: Avram Lupita BRAVO, MD;  Location: [facility];  Service:  Gastroenterology;;   RE-EXPLORATION LUMBAR/  LAMINECTOMY AND MICRODISKECTOMY  10/14/2001   right L5 -- S1   RIGHT HEART CATH N/A 05/23/2022   Procedure: RIGHT HEART CATH;  Surgeon: Cherrie Toribio SAUNDERS, MD;  Location: [facility];  Service: Cardiovascular;  Laterality: N/A;   RIGHT/LEFT HEART CATH AND CORONARY ANGIOGRAPHY N/A 07/18/2021   Procedure: RIGHT/LEFT HEART CATH AND CORONARY ANGIOGRAPHY;  Surgeon: Cherrie Toribio SAUNDERS, MD;  Location: [facility];  Service: Cardiovascular;  Laterality: N/A;   TUBAL LIGATION Bilateral 10/17/1999   PPTL   UMBILICAL HERNIA REPAIR  age 80   Patient Active Problem List   Diagnosis Date Noted   Cerebrovascular accident (CVA) (HCC) 05/26/2023   Pyuria 05/25/2023   Left-sided weakness 05/24/2023   Benign neoplasm of cecum 03/26/2023   Benign neoplasm of descending colon 03/26/2023   Benign neoplasm of sigmoid colon 03/26/2023   Adhesive capsulitis of right shoulder 08/24/2022   Mass of right hand 02/22/2022   LBBB (left bundle branch block) 02/01/2022   Type 2 diabetes mellitus with hyperglycemia, without long-term current use of insulin  (HCC) 01/22/2022   Hypertriglyceridemia 01/21/2022   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 10/26/2021   Chronic combined systolic and diastolic heart failure (HCC) 07/27/2021   Acute combined systolic (congestive) and diastolic (congestive) heart failure (HCC)    Hypocalcemia 07/12/2021   Elevated troponin 07/12/2021   Lipoma of hand 07/20/2020   Osteoarthritis of carpometacarpal (CMC) joint of thumb 07/20/2020   Pain in right hand 07/20/2020   Radial styloid tenosynovitis 07/20/2020   Other osteoarthritis of spine 10/23/2019   Suspected COVID-19 virus infection 06/11/2019   Acute respiratory failure with hypoxia (HCC) 06/11/2019   Multifocal pneumonia 06/11/2019   Carpal tunnel syndrome, bilateral 04/24/2019   Ureteral stone with hydronephrosis 12/31/2016   Hx of adenomatous  colonic polyps 08/03/2016   Normocytic anemia 07/10/2016   Essential hypertension 07/26/2015   Eczema 07/26/2015   Overweight (BMI 25.0-29.9) 07/26/2015    ONSET DATE: Referral: 05/27/2023 Hospitalized: 11/22 - 05/27/23  REFERRING DIAG: I63.9 (ICD-10-CM) - Cerebrovascular accident (CVA), unspecified mechanism  THERAPY DIAG:  Other lack of coordination  Muscle weakness (generalized)  Rationale for Evaluation and Treatment: Rehabilitation  SUBJECTIVE:   SUBJECTIVE STATEMENT:  Pt reported that her vision feels like it is back to normal ie) not blurry at this time. She still notes that her L side is still a Lyman weak.  She is still using her cane for longer distances/activities for her balance which she was not using before the stroke.   She reports her speech is back to normal also.  Pt also notes that she has been back to work at the [employer] but has been sitting for the majority of her tasks.  Pt accompanied by: self  PERTINENT HISTORY:   Presented to ED with left-sided weakness and aphasia.  Pt hospitalized x 3 days.   Discharge from [facility] had follow up info  listed below:  CVA: Per neurology wanted aspirin  81 mg and Plavix  75 mg for 3 weeks then Plavix  alone.  Patient discharged with 81 mg of aspirin  for 19 days.  Lipitor 10 mg daily with LDL less than 70 at goal.  Physical therapy and Occupational Therapy referrals are sent. Heart failure w/defibrillator: Initially we held her Lasix  and Entresto  in this hospitalization because of softer blood pressure and she became orthostatic; discharged her with holding spironolactone , resuming back her Entresto  and Lasix .  Please evaluate her blood pressure and start her medications, GDMT accordingly. Left upper abdominal pain/costochondritis: Discharged her with some topical ointment and Lidoderm  patches. Type 2 diabetes: Newly diagnosed in this inpatient, A1c 7.1; discharged home with metformin  500 mg, the first 2 weeks then  titrating to 500 mg twice daily. UTI: Discharge her with cefadroxil  500 mg BID for 3 days, holding her Jardiance  until she finishes her antibiotic course.  Please check her UA.  PRECAUTIONS: Other: Heart Defibrillator since 08/2022  WEIGHT BEARING RESTRICTIONS: No  PAIN:  Are you having pain? No  FALLS: Has patient fallen in last 6 months? No  LIVING ENVIRONMENT: Lives with: lives with their family Lives in: House/apartment Stairs: Yes: External: 1 steps; none Has following equipment at home: Single point cane and Walker - 2 wheeled  PLOF: Independent & driving, part-time work at the [employer] but not since stroke  PATIENT GOALS: Get my strength back up,   OBJECTIVE:  Note: Objective measures were completed at Evaluation unless otherwise noted.  HAND DOMINANCE: Right  ADLs: Overall ADLs: Generally Ind but slower than before Transfers/ambulation related to ADLs: Eating: Ind Grooming: Can't braid hair, apply lashes or do her makeup per PLOF UB Dressing: Ind LB Dressing: Ind Toileting: Ind Bathing: Showers in standing, nothing to hold onto [employer] transfers: Husband present and helps - has garden Warden/ranger: none  IADLs: Just started driving yesterday Shopping: helping but not the endurance with walking Light housekeeping: A Luber but not like before, hasn't done laundry yet Meal Prep: Not yet Community mobility: using cane Medication management: uses pill box Financial management: Ind Handwriting: Not diffiuclty (R handed)  MOBILITY STATUS: Independent  POSTURE COMMENTS:  No Significant postural limitations Sitting balance: WFL  ACTIVITY TOLERANCE: Activity tolerance: Naps 2x/day - doesn't sleep well at night since the stroke  FUNCTIONAL OUTCOME MEASURES: Quick Dash: 52.3  UPPER EXTREMITY ROM:    Active ROM Right eval Left eval  Shoulder flexion <90* frozen shoulder June Slight end range limits  Shoulder abduction    Shoulder adduction     Shoulder extension    Shoulder internal rotation    Shoulder external rotation    Elbow flexion    Elbow extension    Wrist flexion    Wrist extension    Wrist ulnar deviation    Wrist radial deviation    Wrist pronation    Wrist supination    (Blank rows = not tested)  UPPER EXTREMITY MMT:     MMT Right eval Left eval  Shoulder flexion    Shoulder abduction    Shoulder adduction    Shoulder extension    Shoulder internal rotation    Shoulder external rotation    Middle trapezius    Lower trapezius    Elbow flexion 4+ 3+  Elbow extension 4+ 3+  Wrist flexion    Wrist extension    Wrist ulnar deviation    Wrist radial deviation    Wrist pronation  Wrist supination    (Blank rows = not tested)  HAND FUNCTION: Grip strength: Right: 41.4, 44.0, 43.8  lbs; Left: 31.9, 36.5, 31.5 lbs Average: Right: 43.1 lbs Left: 33.3 lbs  07/08/23 Right 41.6 44.3, 50.7, 53.5, 63.4 Average: 50.7 lbs Left 30.8, 41.2, 44.0, 57.9, Average: 43.6 lbs  COORDINATION: 9 Hole Peg test: Right: 25.47 sec; Left: 28.56 sec  SENSATION: WFL  EDEMA: NA  MUSCLE TONE: WFL  COGNITION: Overall cognitive status: Within functional limits for tasks assessed  VISION: Subjective report: Noticing [place] changes in vision - vision affected when tired, light can make her dizzy when they are too bright Baseline vision: Wears glasses all the time - progressive lenses Visual history: NA  VISION ASSESSMENT: Tracking/Visual pursuits: Decreased smoothness with horizontal tracking and Decreased smoothness with vertical tracking  Patient has difficulty with following activities due to following visual impairments: gets tired with visual changes   OBSERVATIONS at Eval: Pt is a tall AA female who ambulated with use of a cane today which she was not using prior to the stroke. No loss of balance but she was slow with ambulation and careful with transition to/from chair. The pt appears well kept and has  jacket donned, which she was able to remove herself and don with min assist.   TODAY'S TREATMENT:                                                                                                                               Therapeutic Activities: Retested grip strength due to 1 month since eval and good improvements noted but still side range of strength for BUEs: Right 41.6 44.3, 50.7, 53.5, 63.4 Average: 50.7 lbs Left 30.8, 41.2, 44.0, 57.9, Average: 43.6 lbs  Initiated Putty Exercises with green putty to progress strengthen and coordination of BUEs.  Patient provided visual demonstration, verbal and tactile cues as needed to improve performance of the various exercises/activities including:   - Putty Squeezes - cues to squeeze putty into log for use with other exercises and to fold putty in half with 1 hand  - Putty Rolls - encourage to roll putty into logs with sensory stimulation to entire length of hand, fingers and wrist as needed   - Pinch and Pull with Putty - this motion is combined with different pinches (3-Point Pinch, Tip Pinch, Key Pinch) - patient encouraged to combine tripod, pincer and/or key pinch with pinch and pull motion of putty pulling away from midline, changing between different pinches and changing different directions to change grip   -Finger Extension with Putty - pt shown how to work on task with all fingers and thumb as well as individual fingers in opposition to thumb  -Finger Adduction with Putty - pt shown how to weave putty between fingers and to close them todether  - Removing Objects from Putty  - encouraged to hide items (coins, marble, dice etc) and use a combination of above motions to locate  items ie) pinch and pull etc.  OT educated patient on theraputty recommendations: avoid hot environments, place in designated container, avoid contact with fabrics. Patient verbalized understanding.    Patient benefited from extra time, verbal/tactile cues, and  modeling of task to allow time for processing of verbal instructions and improve motor planning of unfamiliar movements.   Coordination Activity handout with images provided for various activities to work on L UE finger ROM, dexterity and isolated movements with demonstration to improve digital isolation and ease of performing tasks.  Reviewed the following ideas   Card games ie) dealing, holding, flipping cards  etc.  Pt reports that they played [place] over the holidays.    Pick up coins with alternate ideas ie) buttons, marbles, game pieces etc of different sizes ... To place in containers To stack - with guidance to work on include/isolate specific fingers. To pick up items one at a time until she gets multiple in her hand and then move item from palm to fingertips to release.     Twirl pen/cil between fingers. - Encouragement to isolate fingers individually and flip the pen/cil or move fingers up and down pen to get it in position for writing or erasing.    Patient is encourage to take breaks, relax shoulder, minimize compensatory motions and a try different activities throughout the week.   PATIENT EDUCATION: Education details: OT goals, putty & coordination Activities  Person educated: Patient Education method: Explanation, Demonstration, Tactile cues, Verbal cues, and Handouts Education comprehension: verbalized understanding, returned demonstration, verbal cues required, and needs further education  HOME EXERCISE PROGRAM: 07/08/23: Putty Activities Access Code [email or web address]; Coordination activities with images  GOALS: Goals reviewed with patient? Yes  SHORT TERM GOALS: Target date: 06/28/23  Patient will demonstrate updated LUE HEP with 25% verbal cues or less for proper execution. Baseline: New to outpt OT Goal status: IN Progress  2.  Patient will demonstrate at least 5 lbs improvement with BUE grip strength as needed to open jars and other containers as well as carry objects from  the car with LUE. Baseline: Right: 43.1 lbs Left: 33.3 lbs / Moderate Difficulty per QuickDash Goal status: MET 07/08/23: Right 50.7 lbs; Average: 43.6 lbs  3.  Patient will demo improved FM coordination as evidenced by no difficulty with cutting food on her own.  Baseline: Moderate Difficulty per QuickDash Goal status: MET  4. Patient will be able to safely move in the kitchen without AE for simple meal prep activities x 15+ minutes.  Baseline: Has not resumed cooking since stroke  Goal Status: IN Progress  07/08/23 - concerns about standing for awhile  5. Patient will demonstrate appropriate vision HEP to help with improved visual tracking and decreased tiredness with visual activities.  Baseline: vision affected when tired, light can make her dizzy when they are too bright  Goal Status: INITIAL   LONG TERM GOALS: Target date: 08/02/23  Patient will demonstrate updated LUE HEP with visual handouts only for proper execution. Baseline: New to outpt OT Goal status: INITIAL  2.  Patient will demonstrate increased UE strength, coordination and activity tolerance for overhead activities ie) hair care. Baseline: Subjectively reports she is unable to braid her hair, apply lashes or do her makeup per PLOF Goal status: IN Progress  3.  Patient will demonstrate at least 16% improvement with quick Dash score (reporting <35% disability or less) indicating improved functional use of affected extremity. Baseline: Quick Dash: 52.3 Goal status: INITIAL  ASSESSMENT:  CLINICAL IMPRESSION:  Patient is a 63 y.o. female who was seen today for occupational therapy treatment for residual deficits s/p CVA and L sided weakness. Pt responded well to HEP ideas today. Pt will benefit from a couple more skilled OT visits in the outpatient setting to work on LUE impairments as well as activity tolerance to resume daily activities such as cooking etc to help pt return to PLOF.    PERFORMANCE DEFICITS: in  functional skills including ADLs, IADLs, coordination, dexterity, proprioception, sensation, ROM, strength, flexibility, Fine motor control, Gross motor control, balance, cardiopulmonary status limiting function, decreased knowledge of precautions, decreased knowledge of use of DME, vision, and UE functional use, cognitive skills including attention, emotional, and problem solving, and psychosocial skills including coping strategies, environmental adaptation, habits, and routines and behaviors.   IMPAIRMENTS: are limiting patient from ADLs, IADLs, rest and sleep, and leisure.   CO-MORBIDITIES: has co-morbidities such as newly dx DM2 and heart failure with defibrillator  that affects occupational performance. Patient will benefit from skilled OT to address above impairments and improve overall function.  REHAB POTENTIAL: Excellent   PLAN:  OT FREQUENCY: 1x/week  OT DURATION: 6 weeks - POC dates extended for holidays  PLANNED INTERVENTIONS: 97535 self care/ADL training, [id number] therapeutic exercise, 97530 therapeutic activity, 97112 neuromuscular re-education, energy conservation, coping strategies training, patient/family education, and DME and/or AE instructions  RECOMMENDED OTHER SERVICES: Pt missed P.T. evaluation prior to OT today and appt is rescheduled  CONSULTED AND AGREED WITH PLAN OF CARE: Patient  PLAN FOR NEXT SESSION:  Review and update HEPs (putty, coordination and theraband) Standing activities for ADLs Vision HEP  Clarita LITTIE Pride, OT 07/08/2023, 12:17 PM

## 2023-07-09 ENCOUNTER — Encounter (HOSPITAL_COMMUNITY): Payer: Self-pay | Admitting: Internal Medicine

## 2023-07-09 ENCOUNTER — Ambulatory Visit (HOSPITAL_COMMUNITY)
Admission: RE | Admit: 2023-07-09 | Discharge: 2023-07-09 | Disposition: A | Discharge status: Home / Self Care | Payer: Medicaid Other | Source: Ambulatory Visit | Attending: Internal Medicine | Admitting: Internal Medicine

## 2023-07-09 VITALS — BP 110/70 | HR 95

## 2023-07-09 DIAGNOSIS — K219 Gastro-esophageal reflux disease without esophagitis: Secondary | ICD-10-CM | POA: Diagnosis not present

## 2023-07-09 DIAGNOSIS — Z7984 Long term (current) use of oral hypoglycemic drugs: Secondary | ICD-10-CM | POA: Diagnosis not present

## 2023-07-09 DIAGNOSIS — I10 Essential (primary) hypertension: Secondary | ICD-10-CM

## 2023-07-09 DIAGNOSIS — I447 Left bundle-branch block, unspecified: Secondary | ICD-10-CM | POA: Insufficient documentation

## 2023-07-09 DIAGNOSIS — Z8673 Personal history of transient ischemic attack (TIA), and cerebral infarction without residual deficits: Secondary | ICD-10-CM | POA: Insufficient documentation

## 2023-07-09 DIAGNOSIS — I428 Other cardiomyopathies: Secondary | ICD-10-CM | POA: Diagnosis not present

## 2023-07-09 DIAGNOSIS — I110 Hypertensive heart disease with heart failure: Secondary | ICD-10-CM | POA: Insufficient documentation

## 2023-07-09 DIAGNOSIS — Z9079 Acquired absence of other genital organ(s): Secondary | ICD-10-CM | POA: Diagnosis not present

## 2023-07-09 DIAGNOSIS — Z79899 Other long term (current) drug therapy: Secondary | ICD-10-CM | POA: Diagnosis not present

## 2023-07-09 DIAGNOSIS — Z905 Acquired absence of kidney: Secondary | ICD-10-CM | POA: Diagnosis not present

## 2023-07-09 DIAGNOSIS — I6300 Cerebral infarction due to thrombosis of unspecified precerebral artery: Secondary | ICD-10-CM

## 2023-07-09 DIAGNOSIS — I5042 Chronic combined systolic (congestive) and diastolic (congestive) heart failure: Secondary | ICD-10-CM | POA: Diagnosis not present

## 2023-07-09 DIAGNOSIS — I2510 Atherosclerotic heart disease of native coronary artery without angina pectoris: Secondary | ICD-10-CM | POA: Insufficient documentation

## 2023-07-09 DIAGNOSIS — Z7902 Long term (current) use of antithrombotics/antiplatelets: Secondary | ICD-10-CM | POA: Diagnosis not present

## 2023-07-09 MED ORDER — BISOPROLOL FUMARATE 5 MG PO TABS: 2.5000 mg | ORAL_TABLET | Freq: Every day | ORAL | 3 refills | Status: DC

## 2023-07-09 NOTE — Progress Notes (Addendum)
 PCP: Bernita Juliane RIGGERS, [facility] Primary Cardiologist: Dr Levern HF MD: Dr Cherrie   Chief complaint: Heart failure  HPI: Ms Freeburg is a 63 y.o.with a history of HTN, GERD, nephrolithiasis, partial right nephrectomy, LBBB, and combined diastolic/systolic HF.      Admitted 1/23 with acute HFrEF. 07/13/21 Echo 20-25%, RV normal, grade II DD , and septal - lateral dyssnchrony. R/L cath w/ Minimal CAD, mildly elevated filling pressures and normal CO. cMRI ? Myocarditis (see impression below). LVEF 18%. Placed on Jardiance  and spiro. Not on bb with acute decompensation. No ARNi with hypotension. Discharged on 07/20/21.   Echo 10/25/21 EF 30-35%  RHC 11/23 RA = 6 RV = 30/10  PA = 35/21 (29) PCW = 20  Fick cardiac output/index = 4.8/2.3 Thermo CO/CI = 5.2/2.4 PVR = 1.9 WU FA sat = 96% PA sat = 62%  CPX 12/23 - submax test. Non diagnostic.  Time 2:45 FVC 2.14 (58% predicted)      FEV1 1.82 (64% predicted)        FEV1/FVC 85%        MVV 65 (59% predicted)  BP rest: 100/74 BP standing: 94/72 BP peak: 92/70  Peak VO2: 8.4 (40.9% predicted peak VO2)  VE/VCO2 slope:  33 Peak RER: 0.85   Underwent Abbott CRT-D on 01/14/23. Got good result with significant QRS narrowing.   Echo 11/24 EF 20-25%   Admitted in 11/24 with stroke like symptoms (aphasia) CTA head a showed a possible small distal ACA occlusion.  CT head was negative for stroke. 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  Returns for routine f/u. Feels much better since CRT. Says all her friends are surpised at how well she is doing. Going to the gym and walking 30 min. 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 Gets SOB with steps. No edema, orthopnea or PND. Complaint with meds.    ROS: All systems negative except as listed in HPI, PMH and Problem List.  SH:  Social History   Socioeconomic History   Marital status: Married    Spouse name: Richard Dooly   Number  of children: 1   Years of education: college   Highest education level: Not on file  Occupational History   Occupation: product manager  Tobacco Use   Smoking status: Former    Current packs/day: 0.00    Average packs/day: 0.8 packs/day for 5.0 years (3.8 ttl pk-yrs)    Types: Cigarettes    Start date: 01/11/1992    Quit date: 01/10/1997    Years since quitting: 26.5   Smokeless tobacco: Never  Vaping Use   Vaping status: Never Used  Substance and Sexual Activity   Alcohol use: Not Currently    Comment: social use; once a month   Drug use: No   Sexual activity: Yes    Partners: Male    Birth control/protection: Surgical  Other Topics Concern   Not on file  Social History Narrative   Lives with her husband and their daughter and their dog.   Social Drivers of Corporate Investment Banker Strain: Low Risk  (08/20/2022)   Received from [facility], [facility]   Overall Financial Resource Strain (CARDIA)    Difficulty of Paying Living Expenses: Not very hard  Food Insecurity: No Food Insecurity (05/24/2023)   Hunger Vital Sign    Worried About Running Out of Food in the Last Year: Never true    Ran  Out of Food in the Last Year: Never true  Transportation Needs: No Transportation Needs (05/24/2023)   PRAPARE - Administrator, Civil Service (Medical): No    Lack of Transportation (Non-Medical): No  Physical Activity: Insufficiently Active (08/20/2022)   Received from [facility], [facility]   Exercise Vital Sign    Days of Exercise per Week: 3 days    Minutes of Exercise per Session: 30 min  Stress: No Stress Concern Present (08/20/2022)   Received from [facility], [facility] - Occupational Stress Questionnaire    Feeling of Stress : Only a [place]  Social Connections: Socially Integrated (08/20/2022)   Received from [facility], [facility]   Social Network    How would you rate your social network  (family, work, friends)?: Good participation with social networks  Intimate Partner Violence: Not At Risk (05/24/2023)   Humiliation, Afraid, Rape, and Kick questionnaire    Fear of Current or Ex-Partner: No    Emotionally Abused: No    Physically Abused: No    Sexually Abused: No   FH:  Family History  Problem Relation Age of Onset   Sarcoidosis Mother    Prostate cancer Father    Hypertension Sister    Eczema Sister    Lupus Sister    Breast cancer Maternal Aunt    Gout Maternal Grandmother    Diabetes Maternal Grandmother        leg amputations   Stomach cancer Neg Hx    Colon cancer Neg Hx    Esophageal cancer Neg Hx    Rectal cancer Neg Hx    Past Medical History:  Diagnosis Date   Anemia    Arthritis    CHF (congestive heart failure) (HCC) 12/06/2022   EF is 20% to 25 %   COPD (chronic obstructive pulmonary disease) (HCC)    Diabetes mellitus without complication (HCC)    Eczema    GERD (gastroesophageal reflux disease)    History of adenomatous polyp of colon    08-03-2016  tubular adenoma x2 and hyperplastic   History of chronic gastritis 08/03/2016   History of ectopic pregnancy 1988   s/p  left salpingectomy   History of endometriosis    History of kidney stones    History of partial nephrectomy    right pelvis for very large stone   History of pneumonia 07/02/2016   CAP   History of sepsis    07-01-2016  sepsis w/ pyelonephritis, CAP /   12-31-2016  urosepsis w/ kidney stone obstruction   History of uterine leiomyoma    Hx of adenomatous colonic polyps 08/03/2016   2 adenomas, 1 hpp and 1 lost polyp 2018 all < 1 cm    Hyperlipidemia    Hypertension    Left ureteral stone    Myocardial infarction (HCC)    Nephrolithiasis    left obstructive stone and right non-obstructive stone  per CT 12-31-2016   Urgency of urination    Current Outpatient Medications  Medication Sig Dispense Refill   acetaminophen  (TYLENOL ) 500 MG tablet Take 500-1,000 mg by  mouth every 6 (six) hours as needed (for pain.).     atorvastatin  (LIPITOR) 10 MG tablet Take 10 mg by mouth daily.     calcium  carbonate (TUMS - DOSED IN MG ELEMENTAL CALCIUM ) 500 MG chewable tablet Chew 1 tablet (200 mg of elemental calcium  total) by mouth 2 (two) times daily with a meal. 60  tablet 0   calcium  carbonate (TUMS) 500 MG chewable tablet Chew 1 tablet by mouth as needed for indigestion or heartburn.     clopidogrel  (PLAVIX ) 75 MG tablet Take 1 tablet (75 mg total) by mouth daily. 90 tablet 3   digoxin  (LANOXIN ) 0.125 MG tablet TAKE 1 TABLET BY MOUTH EVERY DAY 90 tablet 3   empagliflozin  (JARDIANCE ) 10 MG TABS tablet Take 1 tablet (10 mg total) by mouth daily.     furosemide  (LASIX ) 40 MG tablet Take 40 mg by mouth daily.     lidocaine  (LIDODERM ) 5 % Place 1 patch onto the skin daily. Remove & Discard patch within 12 hours or as directed by MD 5 patch 0   lidocaine  (LMX) 4 % cream Apply topically 4 (four) times daily as needed (Left rib pain). 30 g 0   Multiple Vitamin (MULTIVITAMIN WITH MINERALS) TABS tablet Take 1 tablet by mouth daily with lunch. One A Day for Women 50+     sacubitril -valsartan  (ENTRESTO ) 24-26 MG TAKE 1 TABLET BY MOUTH TWICE A DAY 60 tablet 2   metFORMIN  (GLUCOPHAGE ) 500 MG tablet Take 1 tablet (500 mg total) by mouth 2 (two) times daily with a meal. 60 tablet 0   No current facility-administered medications for this encounter.   BP 110/70   Pulse 95   SpO2 99%   Wt Readings from Last 3 Encounters:  06/24/23 93.9 kg (207 lb)  05/27/23 97.2 kg (214 lb 4.6 oz)  05/20/23 96.6 kg (213 lb)   PHYSICAL EXAM: General:  Well appearing. No resp difficulty HEENT: normal Neck: supple. no JVD. Carotids 2+ bilat; no bruits. No lymphadenopathy or thryomegaly appreciated. Cor: PMI nondisplaced. Regular rate & rhythm. No rubs, gallops or murmurs. Lungs: clear Abdomen: soft, nontender, nondistended. No hepatosplenomegaly. No bruits or masses. Good bowel  sounds. Extremities: no cyanosis, clubbing, rash, edema Neuro: alert & orientedx3, cranial nerves grossly intact. moves all 4 extremities w/o difficulty. Affect pleasant  ICD interrogation in clinic: No VT/AF. CRT 99% (AS-VP). Fluid ok.   ASSESSMENT & PLAN:  Chronic Combined Systolic/Diastolic HF, NICM.  - 07/13/21 Echo 20-25%, RV normal, grade II DD , and septal -lateral dyssnchrony - 07/2021 cMRI possible myocarditis  LVEF 18% Suggestive of LBBB, RVEF 33% - [facility] 07/2021 min CAD. RA 6 PCWP 21, CO 6.8/ CI 3.1 - Echo(4/23): EF 30-35% -> EF improving but still with significant LV dysfunction (suspect resolving myocarditis) - Echo 10/23 EF < 20% RV ok  -RHC 11/23 w/ Mildly elevated filling pressures with mildly decreased CO - CPX 12/23 - submax test. Non diagnostic.  - Echo 12/06/22 EF 20-25%  RV normal.  - Echo 01/14/23 EF < 20% RV ok  - LBBB, QRS  176 ms. -> now s/p Abbott CRT-D 7/24 -> QRS  - ICD interrogated in clinic as above  - Echo 11/24 EF 20-25%  - Much improved with CRT. NYHA II  - Volume status lloks good - Continue digoxin  0.125 mg daily.  - Continue spironolactone  25 mg daily  - Continue entresto  24-26 mg twice a day,  - Continue Jardiance  10 mg daily.  - Will try to add low-dose b-blocker and see if she can tolerate. Reports rash with Toprol  and carvedilol  in past. Try bisoprolol  2.5 daily  - QRS has shortened dramatically with CRT-D but EF has not improved with CRT - Repeat echo in 6 months.   2. HTN - Blood pressure well controlled. Continue current regimen.  3. LBBB - LBBB,  QRS 176 ms - now s/p Abbott CRT-D 7/24 -> QRS  - QRS has shortened dramatically with CRT-D  and symptoms much improved but EF has not improved yet - Repeat echo as above.  4. Partial R Nephrectomy - recent labs reviewed and stable - Scr 1.13 on 05/26/23   5. 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  - Low  threshold for [facility]   Toribio Fuel MD 11:30 AM

## 2023-07-09 NOTE — Patient Instructions (Addendum)
 Medication Changes:  START: BISOPROLOL  2.5MG  ONCE DAILY   Follow-Up in: 6 MONTHS WITH ECHO PLEASE CALL OUR OFFICE AROUND MAY 2025 TO GET SCHEDULED FOR YOUR APPOINTMENT. PHONE NUMBER IS [phone] OPTION 2   At the [facility], you and your health needs are our priority. We have a designated team specialized in the treatment of Heart Failure. This Care Team includes your primary Heart Failure Specialized Cardiologist (physician), Advanced Practice Providers (APPs- Physician Assistants and Nurse Practitioners), and Pharmacist who all work together to provide you with the care you need, when you need it.   You may see any of the following providers on your designated Care Team at your next follow up:  Dr. Toribio Fuel Dr. Ezra Shuck Dr. Ria Commander Dr. Odis Brownie Greig Mosses, NP Caffie Shed, [place] [facility] [place], [place] Beckey Coe, NP Jordan Lee, NP Tinnie Redman, PharmD   Please be sure to bring in all your medications bottles to every appointment.   Need to Contact Us :  If you have any questions or concerns before your next appointment please send us  a message through [place] or call our office at [phone].    TO LEAVE A MESSAGE FOR THE NURSE SELECT OPTION 2, PLEASE LEAVE A MESSAGE INCLUDING: YOUR NAME DATE OF BIRTH CALL BACK NUMBER REASON FOR CALL**this is important as we prioritize the call backs  YOU WILL RECEIVE A CALL BACK THE SAME DAY AS LONG AS YOU CALL BEFORE 4:00 PM

## 2023-07-15 ENCOUNTER — Ambulatory Visit: Payer: Medicaid Other | Admitting: Physical Therapy

## 2023-07-15 ENCOUNTER — Ambulatory Visit: Payer: Medicaid Other | Admitting: Occupational Therapy

## 2023-07-15 ENCOUNTER — Encounter: Payer: Self-pay | Admitting: Physical Therapy

## 2023-07-15 DIAGNOSIS — R278 Other lack of coordination: Secondary | ICD-10-CM

## 2023-07-15 DIAGNOSIS — R208 Other disturbances of skin sensation: Secondary | ICD-10-CM

## 2023-07-15 DIAGNOSIS — G8929 Other chronic pain: Secondary | ICD-10-CM

## 2023-07-15 DIAGNOSIS — R2681 Unsteadiness on feet: Secondary | ICD-10-CM

## 2023-07-15 DIAGNOSIS — M6281 Muscle weakness (generalized): Secondary | ICD-10-CM

## 2023-07-15 DIAGNOSIS — R29818 Other symptoms and signs involving the nervous system: Secondary | ICD-10-CM

## 2023-07-15 NOTE — Therapy (Signed)
 OUTPATIENT PHYSICAL THERAPY NEURO TREATMENT   Patient Name: Lynn Estrada MRN: [id number] DOB:1960/07/30, 63 y.o., female Today's Date: 07/15/2023   PCP:  Juliane Che, PA   REFERRING PROVIDER: Forest Coy, MD  END OF SESSION:  PT End of Session - 07/15/23 1105     Visit Number 2    Number of Visits 7    Date for PT Re-Evaluation 09/06/23    Authorization Type [place] MEDICAID WELLCARE    PT Start Time 1104    PT Stop Time 1144    PT Time Calculation (min) 40 min    Equipment Utilized During Treatment Gait belt    Activity Tolerance Patient tolerated treatment well    Behavior During Therapy WFL for tasks assessed/performed             Past Medical History:  Diagnosis Date   Anemia    Arthritis    CHF (congestive heart failure) (HCC) 12/06/2022   EF is 20% to 25 %   COPD (chronic obstructive pulmonary disease) (HCC)    Diabetes mellitus without complication (HCC)    Eczema    GERD (gastroesophageal reflux disease)    History of adenomatous polyp of colon    08-03-2016  tubular adenoma x2 and hyperplastic   History of chronic gastritis 08/03/2016   History of ectopic pregnancy 1988   s/p  left salpingectomy   History of endometriosis    History of kidney stones    History of partial nephrectomy    right pelvis for very large stone   History of pneumonia 07/02/2016   CAP   History of sepsis    07-01-2016  sepsis w/ pyelonephritis, CAP /   12-31-2016  urosepsis w/ kidney stone obstruction   History of uterine leiomyoma    Hx of adenomatous colonic polyps 08/03/2016   2 adenomas, 1 hpp and 1 lost polyp 2018 all < 1 cm    Hyperlipidemia    Hypertension    Left ureteral stone    Myocardial infarction [facility])    Nephrolithiasis    left obstructive stone and right non-obstructive stone  per CT 12-31-2016   Urgency of urination    Past Surgical History:  Procedure Laterality Date   ABDOMINAL HYSTERECTOMY  01/20/2007   w/ Lysis Adhesions/  Left  salpingoophorectomy/  Right Salpingectomy   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   Procedure: BIV ICD INSERTION CRT-D;  Surgeon: Nancey Eulas BRAVO, MD;  Location: [facility];  Service: Cardiovascular;  Laterality: N/A;   CHOLECYSTECTOMY  1994   and Right Partial Nephrectomy (pelvis for very large stone)   COLONOSCOPY WITH PROPOFOL  N/A 03/26/2023   Procedure: COLONOSCOPY WITH PROPOFOL ;  Surgeon: Avram Lupita BRAVO, MD;  Location: [facility];  Service: Gastroenterology;  Laterality: N/A;   CYSTOSCOPY W/ URETERAL STENT PLACEMENT Left 12/31/2016   Procedure: CYSTOSCOPY WITH RETROGRADE PYELOGRAM/ LEFT URETERAL STENT PLACEMENT;  Surgeon: Alvaro Hummer, MD;  Location: [facility];  Service: Urology;  Laterality: Left;   CYSTOSCOPY WITH RETROGRADE PYELOGRAM, URETEROSCOPY AND STENT PLACEMENT Left 01/18/2017   Procedure: 1ST STAGE CYSTOSCOPY WITH RETROGRADE PYELOGRAM, URETEROSCOPY AND STENT REPLACEMENT;  Surgeon: Alvaro Hummer, MD;  Location: [facility];  Service: Urology;  Laterality: Left;   CYSTOSCOPY WITH RETROGRADE PYELOGRAM, URETEROSCOPY AND STENT PLACEMENT Left 02/01/2017   Procedure: 2ND STAGE CYSTOSCOPY WITH RETROGRADE PYELOGRAM, URETEROSCOPY AND STENT REPLACEMENT;  Surgeon: Alvaro Hummer, MD;  Location: [facility];  Service: Urology;  Laterality: Left;   ECTOPIC PREGNANCY SURGERY  1988   Left Salpingectomy   HOLMIUM LASER APPLICATION Left 01/18/2017   Procedure: HOLMIUM LASER APPLICATION;  Surgeon: Alvaro Hummer, MD;  Location: [facility];  Service: Urology;  Laterality: Left;   HOLMIUM LASER APPLICATION Left 02/01/2017   Procedure: HOLMIUM LASER APPLICATION;  Surgeon: Alvaro Hummer, MD;  Location: [facility];  Service: Urology;  Laterality: Left;   LUMBAR DISC SURGERY  01/1999   right L5 -- S1   POLYPECTOMY  03/26/2023   Procedure: POLYPECTOMY;  Surgeon: Avram Lupita BRAVO, MD;  Location: [facility];  Service:  Gastroenterology;;   RE-EXPLORATION LUMBAR/  LAMINECTOMY AND MICRODISKECTOMY  10/14/2001   right L5 -- S1   RIGHT HEART CATH N/A 05/23/2022   Procedure: RIGHT HEART CATH;  Surgeon: Cherrie Toribio SAUNDERS, MD;  Location: [facility];  Service: Cardiovascular;  Laterality: N/A;   RIGHT/LEFT HEART CATH AND CORONARY ANGIOGRAPHY N/A 07/18/2021   Procedure: RIGHT/LEFT HEART CATH AND CORONARY ANGIOGRAPHY;  Surgeon: Cherrie Toribio SAUNDERS, MD;  Location: [facility];  Service: Cardiovascular;  Laterality: N/A;   TUBAL LIGATION Bilateral 10/17/1999   PPTL   UMBILICAL HERNIA REPAIR  age 68   Patient Active Problem List   Diagnosis Date Noted   Cerebrovascular accident (CVA) (HCC) 05/26/2023   Pyuria 05/25/2023   Left-sided weakness 05/24/2023   Benign neoplasm of cecum 03/26/2023   Benign neoplasm of descending colon 03/26/2023   Benign neoplasm of sigmoid colon 03/26/2023   Adhesive capsulitis of right shoulder 08/24/2022   Mass of right hand 02/22/2022   LBBB (left bundle branch block) 02/01/2022   Type 2 diabetes mellitus with hyperglycemia, without long-term current use of insulin  (HCC) 01/22/2022   Hypertriglyceridemia 01/21/2022   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 10/26/2021   Chronic combined systolic and diastolic heart failure (HCC) 07/27/2021   Acute combined systolic (congestive) and diastolic (congestive) heart failure (HCC)    Hypocalcemia 07/12/2021   Elevated troponin 07/12/2021   Lipoma of hand 07/20/2020   Osteoarthritis of carpometacarpal (CMC) joint of thumb 07/20/2020   Pain in right hand 07/20/2020   Radial styloid tenosynovitis 07/20/2020   Other osteoarthritis of spine 10/23/2019   Suspected COVID-19 virus infection 06/11/2019   Acute respiratory failure with hypoxia (HCC) 06/11/2019   Multifocal pneumonia 06/11/2019   Carpal tunnel syndrome, bilateral 04/24/2019   Ureteral stone with hydronephrosis 12/31/2016   Hx of adenomatous  colonic polyps 08/03/2016   Normocytic anemia 07/10/2016   Essential hypertension 07/26/2015   Eczema 07/26/2015   Overweight (BMI 25.0-29.9) 07/26/2015    ONSET DATE: 05/27/2023  REFERRING DIAG: I63.9 (ICD-10-CM) - Cerebrovascular accident (CVA), unspecified mechanism (HCC)  THERAPY DIAG:  Other lack of coordination  Muscle weakness (generalized)  Other symptoms and signs involving the nervous system  Unsteadiness on feet  Rationale for Evaluation and Treatment: Rehabilitation  SUBJECTIVE:  SUBJECTIVE STATEMENT:  GOES BY LYNN  No changes since she was last here. Going to the gym and planning to walk.   Pt accompanied by: self  PERTINENT HISTORY: Pt admitted 05/24/23 with Lt sided weakness and slurred speech which resolved. 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 PMH: HTN, gastritis, GERD, CHF, COPD, DM, CAD, HLD   Heart failure w/defibrillator   PAIN:  Are you having pain? Yes: NPRS scale: 5/10 Pain location: R shoulder  Pain description: Soreness  Aggravating factors: Can't lift it up all the way Relieving factors: Not laying on it   There were no vitals filed for this visit.    PRECAUTIONS: ICD/Pacemaker  RED FLAGS: None   WEIGHT BEARING RESTRICTIONS: No  FALLS: Has patient fallen in last 6 months? No  LIVING ENVIRONMENT: Lives with: lives with their spouse and and her daughter  Lives in: House/apartment Stairs: No - 1 story ranch style  Has following equipment at home: Single point cane and Environmental Consultant - 2 wheeled  PLOF: Independent and Vocation/Vocational requirements: Works part time as an Ship Broker at [employer]   PATIENT GOALS: Wants to strengthen her mobility in her arms and her legs   OBJECTIVE:   Note: Objective measures were completed at Evaluation unless otherwise noted.  DIAGNOSTIC FINDINGS: MRI brain 05/24/23: IMPRESSION: 1. Small acute infarct in the cortex of the posterior right cingulate gyrus (right ACA territory). 2. Tiny remote infarct in the right cerebellar hemisphere. 3. Scattered foci of T2 hyperintensity within the white matter of the cerebral hemispheres, nonspecific but may represent moderate chronic microvascular ischemic changes.    COGNITION: Overall cognitive status: Within functional limits for tasks assessed   SENSATION: WFL  COORDINATION: Heel to shin: slower and more difficult to perform with LLE     LOWER EXTREMITY MMT:    MMT Right Eval Left Eval  Hip flexion 5 3-  Hip extension    Hip abduction 4+ 4  Hip adduction 4+ 4  Hip internal rotation    Hip external rotation    Knee flexion 5 4  Knee extension 5 4  Ankle dorsiflexion 5 5  Ankle plantarflexion    Ankle inversion    Ankle eversion    (Blank rows = not tested)  BED MOBILITY:  Pt reports no difficulties   TRANSFERS: Assistive device utilized: None  Sit to stand: SBA Stand to sit: SBA Needs to use BUE support to stand from chair   STAIRS: Level of Assistance: SBA Stair Negotiation Technique: Alternating Pattern  with Bilateral Rails Number of Stairs: 4  Height of Stairs: 6   GAIT: Gait pattern: step through pattern and decreased stride length Distance walked: Clinic distances  Assistive device utilized: None Level of assistance: SBA Comments: Pt reports having to use a cane out in the community for longer distances, pt not using a cane today.   FUNCTIONAL TESTS:  5 times sit to stand: 19.1 seconds with BUE support  10 meter walk test: 9.1 seconds = 3.6 ft/sec    TODAY'S TREATMENT:  Therapeutic Activity:   [facility] PT Assessment -  07/15/23 1107       6 Minute Walk- Baseline   6 Minute Walk- Baseline yes    BP (mmHg) 115/75    HR (bpm) 83      6 Minute walk- Post Test   6 Minute Walk Post Test yes    BP (mmHg) 112/80    HR (bpm) 87    Modified Borg Scale for Dyspnea 2- Mild shortness of breath      6 minute walk test results    Aerobic Endurance Distance Walked 1260    Endurance additional comments 5/10 RPE            Therapeutic Exercise:  Performed 10 reps sit <> stands with LLE staggered posteriorly, pt reporting that this is too much on bilateral knees, pt needing UE support to stand   Access Code: [email or web address] URL: [email or web address] Date: 07/15/2023 Prepared by: Sheffield Senate  Initiated HEP for strengthening/balance, see MedBridge for more details: Pt reporting incr fatigue afterwards:   Exercises - Walking March  - 1 x daily - 5 x weekly - 3 sets - with red tband around thighs  - Standing Single Leg Stance with Counter Support  - 1-2 x daily - 5 x weekly - 3 sets - 15 hold - Standing Hip Abduction with Resistance at Ankles and Counter Support  - 1 x daily - 5 x weekly - 2 sets - 10 reps - with red tband around ankles  - Standing Hip Extension with Resistance at Ankles and Counter Support  - 1 x daily - 5 x weekly - 2 sets - 10 reps - with red tband around ankles    PATIENT EDUCATION: Education details: Results of , initial HEP for strengthening, gradual walking program at the gym  Person educated: Patient Education method: Programmer, Multimedia, Demonstration, Verbal cues, and Handouts Education comprehension: verbalized understanding, returned demonstration, and needs further education  HOME EXERCISE PROGRAM: Access Code: [email or web address] URL: [email or web address] Date: 07/15/2023 Prepared by: Sheffield Senate  Exercises - Walking March  - 1 x daily - 5 x weekly - 3 sets - Standing Single Leg Stance with Counter Support  - 1-2 x daily - 5 x weekly - 3 sets - 15  hold - Standing Hip Abduction with Resistance at Ankles and Counter Support  - 1 x daily - 5 x weekly - 2 sets - 10 reps - Standing Hip Extension with Resistance at Ankles and Counter Support  - 1 x daily - 5 x weekly - 2 sets - 10 reps  GOALS: Goals reviewed with patient? Yes  SHORT TERM GOALS: ALL STGS = LTGS  LONG TERM GOALS: Target date: 08/05/2023  Pt will be independent with final HEP for strength/balance in order to build upon functional gains made in therapy. Baseline: No HEP Goal status: INITIAL  2.  Pt with improve distance to at least 1320' with 3/10 RPE afterwards in order to demo improved endurance.  Baseline: 1260' with 5/10 RPE  Goal status: INITIAL  3.  Pt will improve FGA to at least a 27/30 in order to demo decr fall risk.  Baseline: 24/30 Goal status: INITIAL  4.  Pt will improve 5x sit<>stand to less than or equal to 16.5 sec to demonstrate improved functional strength and transfer efficiency.   Baseline: 19.1 seconds with BUE support  Goal status: INITIAL    ASSESSMENT:  CLINICAL IMPRESSION: Performed the with pt able to ambulate 1260' with RPE as  5/10 afterwards. LTG updated. Remainder of session focused on initiating HEP for LLE strengthening and balance. Pt fatigued with red t-band resistance exercises. Pt unable to tolerate staggered stance sit <> stands due to knee pain, so did not add to pt's exercises. Will continue per POC.    OBJECTIVE IMPAIRMENTS: Abnormal gait, decreased activity tolerance, decreased balance, decreased coordination, decreased mobility, difficulty walking, decreased strength, and pain.   ACTIVITY LIMITATIONS: lifting, stairs, transfers, and locomotion level  PARTICIPATION LIMITATIONS: community activity  PERSONAL FACTORS: Age, Behavior pattern, Past/current experiences, Time since onset of injury/illness/exacerbation, and 3+ comorbidities: HTN, gastritis, GERD, CHF, COPD, DM, CAD, HLD   are also affecting patient's  functional outcome.   REHAB POTENTIAL: Good  CLINICAL DECISION MAKING: Stable/uncomplicated  EVALUATION COMPLEXITY: Low  PLAN:  PT FREQUENCY: 1x/week  PT DURATION: 8 weeks  PLANNED INTERVENTIONS: 97164- PT Re-evaluation, 97110-Therapeutic exercises, 97530- Therapeutic activity, 97112- Neuromuscular re-education, 97535- Self Care, [id number]- Manual therapy, [phone]- Gait training, Patient/Family education, Balance training, Stair training, Vestibular training, and DME instructions  PLAN FOR NEXT SESSION: LLE strengthening and balance    Sheffield LOISE Senate, PT, DPT 07/15/2023, 12:06 PM    Check all possible CPT codes: 97164 - PT Re-evaluation, 97110- Therapeutic Exercise, [phone]- Neuro Re-education, [phone] - Gait Training, [phone] - Manual Therapy, [phone] - Therapeutic Activities, and [phone] - Self Care    Check all conditions that are expected to impact treatment: Diabetes mellitus and Neurological condition and/or seizures   If treatment provided at initial evaluation, no treatment charged due to lack of authorization.

## 2023-07-15 NOTE — Therapy (Signed)
 OUTPATIENT OCCUPATIONAL THERAPY NEURO TREATMENT  Patient Name: RUSSIA SCHEIDERER MRN: [id number] DOB:03-Jun-1961, 63 y.o., female Today's Date: 07/15/2023  PCP: Juliane Che, PA REFERRING PROVIDER: Forest Coy, MD  END OF SESSION:  OT End of Session - 07/15/23 1013     Visit Number 3    Number of Visits 6    Date for OT Re-Evaluation 08/02/23    Authorization Type [employer]    OT Start Time 1015    OT Stop Time 1100    OT Time Calculation (min) 45 min    Equipment Utilized During Treatment FM objects    Activity Tolerance Patient tolerated treatment well    Behavior During Therapy WFL for tasks assessed/performed             Past Medical History:  Diagnosis Date   Anemia    Arthritis    CHF (congestive heart failure) (HCC) 12/06/2022   EF is 20% to 25 %   COPD (chronic obstructive pulmonary disease) (HCC)    Diabetes mellitus without complication (HCC)    Eczema    GERD (gastroesophageal reflux disease)    History of adenomatous polyp of colon    08-03-2016  tubular adenoma x2 and hyperplastic   History of chronic gastritis 08/03/2016   History of ectopic pregnancy 1988   s/p  left salpingectomy   History of endometriosis    History of kidney stones    History of partial nephrectomy    right pelvis for very large stone   History of pneumonia 07/02/2016   CAP   History of sepsis    07-01-2016  sepsis w/ pyelonephritis, CAP /   12-31-2016  urosepsis w/ kidney stone obstruction   History of uterine leiomyoma    Hx of adenomatous colonic polyps 08/03/2016   2 adenomas, 1 hpp and 1 lost polyp 2018 all < 1 cm    Hyperlipidemia    Hypertension    Left ureteral stone    Myocardial infarction [facility])    Nephrolithiasis    left obstructive stone and right non-obstructive stone  per CT 12-31-2016   Urgency of urination    Past Surgical History:  Procedure Laterality Date   ABDOMINAL HYSTERECTOMY  01/20/2007   w/ Lysis Adhesions/  Left  salpingoophorectomy/  Right Salpingectomy   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   Procedure: BIV ICD INSERTION CRT-D;  Surgeon: Nancey Eulas BRAVO, MD;  Location: [facility];  Service: Cardiovascular;  Laterality: N/A;   CHOLECYSTECTOMY  1994   and Right Partial Nephrectomy (pelvis for very large stone)   COLONOSCOPY WITH PROPOFOL  N/A 03/26/2023   Procedure: COLONOSCOPY WITH PROPOFOL ;  Surgeon: Avram Lupita BRAVO, MD;  Location: [facility];  Service: Gastroenterology;  Laterality: N/A;   CYSTOSCOPY W/ URETERAL STENT PLACEMENT Left 12/31/2016   Procedure: CYSTOSCOPY WITH RETROGRADE PYELOGRAM/ LEFT URETERAL STENT PLACEMENT;  Surgeon: Alvaro Hummer, MD;  Location: [facility];  Service: Urology;  Laterality: Left;   CYSTOSCOPY WITH RETROGRADE PYELOGRAM, URETEROSCOPY AND STENT PLACEMENT Left 01/18/2017   Procedure: 1ST STAGE CYSTOSCOPY WITH RETROGRADE PYELOGRAM, URETEROSCOPY AND STENT REPLACEMENT;  Surgeon: Alvaro Hummer, MD;  Location: [facility];  Service: Urology;  Laterality: Left;   CYSTOSCOPY WITH RETROGRADE PYELOGRAM, URETEROSCOPY AND STENT PLACEMENT Left 02/01/2017   Procedure: 2ND STAGE CYSTOSCOPY WITH RETROGRADE PYELOGRAM, URETEROSCOPY AND STENT REPLACEMENT;  Surgeon: Alvaro Hummer, MD;  Location: [facility];  Service: Urology;  Laterality: Left;   ECTOPIC PREGNANCY SURGERY  1988   Left Salpingectomy  HOLMIUM LASER APPLICATION Left 01/18/2017   Procedure: HOLMIUM LASER APPLICATION;  Surgeon: Alvaro Hummer, MD;  Location: [facility];  Service: Urology;  Laterality: Left;   HOLMIUM LASER APPLICATION Left 02/01/2017   Procedure: HOLMIUM LASER APPLICATION;  Surgeon: Alvaro Hummer, MD;  Location: [facility];  Service: Urology;  Laterality: Left;   LUMBAR DISC SURGERY  01/1999   right L5 -- S1   POLYPECTOMY  03/26/2023   Procedure: POLYPECTOMY;  Surgeon: Avram Lupita BRAVO, MD;  Location: [facility];  Service:  Gastroenterology;;   RE-EXPLORATION LUMBAR/  LAMINECTOMY AND MICRODISKECTOMY  10/14/2001   right L5 -- S1   RIGHT HEART CATH N/A 05/23/2022   Procedure: RIGHT HEART CATH;  Surgeon: Cherrie Toribio SAUNDERS, MD;  Location: [facility];  Service: Cardiovascular;  Laterality: N/A;   RIGHT/LEFT HEART CATH AND CORONARY ANGIOGRAPHY N/A 07/18/2021   Procedure: RIGHT/LEFT HEART CATH AND CORONARY ANGIOGRAPHY;  Surgeon: Cherrie Toribio SAUNDERS, MD;  Location: [facility];  Service: Cardiovascular;  Laterality: N/A;   TUBAL LIGATION Bilateral 10/17/1999   PPTL   UMBILICAL HERNIA REPAIR  age 9   Patient Active Problem List   Diagnosis Date Noted   Cerebrovascular accident (CVA) (HCC) 05/26/2023   Pyuria 05/25/2023   Left-sided weakness 05/24/2023   Benign neoplasm of cecum 03/26/2023   Benign neoplasm of descending colon 03/26/2023   Benign neoplasm of sigmoid colon 03/26/2023   Adhesive capsulitis of right shoulder 08/24/2022   Mass of right hand 02/22/2022   LBBB (left bundle branch block) 02/01/2022   Type 2 diabetes mellitus with hyperglycemia, without long-term current use of insulin  (HCC) 01/22/2022   Hypertriglyceridemia 01/21/2022   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 10/26/2021   Chronic combined systolic and diastolic heart failure (HCC) 07/27/2021   Acute combined systolic (congestive) and diastolic (congestive) heart failure (HCC)    Hypocalcemia 07/12/2021   Elevated troponin 07/12/2021   Lipoma of hand 07/20/2020   Osteoarthritis of carpometacarpal (CMC) joint of thumb 07/20/2020   Pain in right hand 07/20/2020   Radial styloid tenosynovitis 07/20/2020   Other osteoarthritis of spine 10/23/2019   Suspected COVID-19 virus infection 06/11/2019   Acute respiratory failure with hypoxia (HCC) 06/11/2019   Multifocal pneumonia 06/11/2019   Carpal tunnel syndrome, bilateral 04/24/2019   Ureteral stone with hydronephrosis 12/31/2016   Hx of adenomatous  colonic polyps 08/03/2016   Normocytic anemia 07/10/2016   Essential hypertension 07/26/2015   Eczema 07/26/2015   Overweight (BMI 25.0-29.9) 07/26/2015    ONSET DATE: Referral: 05/27/2023 Hospitalized: 11/22 - 05/27/23  REFERRING DIAG: I63.9 (ICD-10-CM) - Cerebrovascular accident (CVA), unspecified mechanism  THERAPY DIAG:  Muscle weakness  Other lack of coordination  Chronic right shoulder pain  Other disturbances of skin sensation  Rationale for Evaluation and Treatment: Rehabilitation  SUBJECTIVE:   SUBJECTIVE STATEMENT:  Pt reported that her vision good,  L side is progressing, she has been using her putty but is still limited with standing tolerance/balance for cooking and R shoulder ROM for hair care etc.  Pt accompanied by: self  PERTINENT HISTORY:   Presented to ED with left-sided weakness and aphasia.  Pt hospitalized x 3 days.   Discharge from [facility] had follow up info listed below:  CVA: Per neurology wanted aspirin  81 mg and Plavix  75 mg for 3 weeks then Plavix  alone.  Patient discharged with 81 mg of aspirin  for 19 days.  Lipitor 10 mg daily with LDL less than 70 at goal.  Physical therapy and Occupational Therapy referrals are sent. Heart failure w/defibrillator: Initially we held her Lasix  and Entresto  in this hospitalization because of softer blood pressure and she became orthostatic; discharged her with holding spironolactone , resuming back her Entresto  and Lasix .  Please evaluate her blood pressure and start her medications, GDMT accordingly. Left upper abdominal pain/costochondritis: Discharged her with some topical ointment and Lidoderm  patches. Type 2 diabetes: Newly diagnosed in this inpatient, A1c 7.1; discharged home with metformin  500 mg, the first 2 weeks then titrating to 500 mg twice daily. UTI: Discharge her with cefadroxil  500 mg BID for 3 days, holding her Jardiance  until she finishes her antibiotic course.  Please check her  UA.  PRECAUTIONS: Other: Heart Defibrillator since 08/2022  WEIGHT BEARING RESTRICTIONS: No  PAIN:  Are you having pain? No  FALLS: Has patient fallen in last 6 months? No  LIVING ENVIRONMENT: Lives with: lives with their family Lives in: House/apartment Stairs: Yes: External: 1 steps; none Has following equipment at home: Single point cane and Walker - 2 wheeled  PLOF: Independent & driving, part-time work at the [employer] but not since stroke  PATIENT GOALS: Get my strength back up,   OBJECTIVE:  Note: Objective measures were completed at Evaluation unless otherwise noted.  HAND DOMINANCE: Right  ADLs: Overall ADLs: Generally Ind but slower than before Transfers/ambulation related to ADLs: Eating: Ind Grooming: Can't braid hair, apply lashes or do her makeup per PLOF UB Dressing: Ind LB Dressing: Ind Toileting: Ind Bathing: Showers in standing, nothing to hold onto [employer] transfers: Husband present and helps - has garden Warden/ranger: none  IADLs: Just started driving yesterday Shopping: helping but not the endurance with walking Light housekeeping: A Timme but not like before, hasn't done laundry yet Meal Prep: Not yet Community mobility: using cane Medication management: uses pill box Financial management: Ind Handwriting: Not diffiuclty (R handed)  MOBILITY STATUS: Independent  POSTURE COMMENTS:  No Significant postural limitations Sitting balance: WFL  ACTIVITY TOLERANCE: Activity tolerance: Naps 2x/day - doesn't sleep well at night since the stroke  FUNCTIONAL OUTCOME MEASURES: Quick Dash: 52.3  UPPER EXTREMITY ROM:    Active ROM Right eval Left eval  Shoulder flexion <90* frozen shoulder June Slight end range limits  Shoulder abduction    Shoulder adduction    Shoulder extension    Shoulder internal rotation    Shoulder external rotation    Elbow flexion    Elbow extension    Wrist flexion    Wrist extension    Wrist ulnar  deviation    Wrist radial deviation    Wrist pronation    Wrist supination    (Blank rows = not tested)  UPPER EXTREMITY MMT:     MMT Right eval Left eval  Shoulder flexion    Shoulder abduction    Shoulder adduction    Shoulder extension    Shoulder internal rotation    Shoulder external rotation    Middle trapezius    Lower trapezius    Elbow flexion 4+ 3+  Elbow extension 4+ 3+  Wrist flexion    Wrist extension    Wrist ulnar deviation    Wrist radial deviation    Wrist pronation    Wrist supination    (Blank rows = not tested)  HAND FUNCTION: Grip strength: Right: 41.4, 44.0, 43.8  lbs; Left: 31.9, 36.5, 31.5 lbs Average: Right: 43.1 lbs Left: 33.3 lbs  07/08/23 Right 41.6 44.3, 50.7, 53.5, 63.4 Average: 50.7 lbs  Left 30.8, 41.2, 44.0, 57.9, Average: 43.6 lbs  COORDINATION: 9 Hole Peg test: Right: 25.47 sec; Left: 28.56 sec  SENSATION: WFL  EDEMA: NA  MUSCLE TONE: WFL  COGNITION: Overall cognitive status: Within functional limits for tasks assessed  VISION: Subjective report: Noticing Schwanke changes in vision - vision affected when tired, light can make her dizzy when they are too bright Baseline vision: Wears glasses all the time - progressive lenses Visual history: NA  VISION ASSESSMENT: Tracking/Visual pursuits: Decreased smoothness with horizontal tracking and Decreased smoothness with vertical tracking  Patient has difficulty with following activities due to following visual impairments: gets tired with visual changes   OBSERVATIONS at Eval: Pt is a tall AA female who ambulated with use of a cane today which she was not using prior to the stroke. No loss of balance but she was slow with ambulation and careful with transition to/from chair. The pt appears well kept and has jacket donned, which she was able to remove herself and don with min assist.   TODAY'S TREATMENT:                                                                                                                                Self Care:  OT educated patient on sleep positioning as noted in patient instructions to reduce stress to upper extremity nerves and shoulder joints, which could be attributing to reported pain in UEs. Demonstrated use of extra pillows to support her back and UEs without laying directly on her shoulders and providing good support of limbs.  Patient verbalized understanding. Handout provided.  Therapeutic Exercises:  Reviewed ROM activities with pt for R UE and L UE as pt has R chronic shoulder pain and ROM limitations and L UE weakness s/p CVA.  Pt used theraband previously and was given a few ideas to help with ROM of R shoulder ie) table slides, supine ROM and resistance band activities.   Exercises - Seated Shoulder Flexion Towel Slide at Table Top  - 1 x daily - 10 reps - Shoulder Flexion Wall Slide with Towel  - 1 x daily - 10 reps - Shoulder extension with resistance - Neutral  - 1 x daily - 10 reps - Standing Diagonal Shoulder Extension with Anchored Resistance  - 1 x daily - 10 reps  Introduced theraband exercises with yellow band to protect R shoulder.  PT encouraged to work on sustained grip with L UE and shown how to perfrom exercises in standing at a closed door with band positioned over the top of the door to work on shoulder ROM and elbow extension and trunk stability. Various modifications made by changing positions ie) turning body 90 degrees x 4 positions for standing shoulder extension at top of the door ie) pull down, PNF like patterns L to R and R to L as well as pull out motions with back facing the door.    Patient is encourage  to take breaks, relax shoulder, minimize compensatory motions and a try different activities throughout the week.   PATIENT EDUCATION: Education details: Theraband exercises Person educated: Patient Education method: Explanation, Demonstration, Tactile cues, Verbal cues, and Handouts Education  comprehension: verbalized understanding, returned demonstration, verbal cues required, and needs further education  HOME EXERCISE PROGRAM: 07/08/23: Putty Activities Access Code [email or web address]; Coordination activities with images 07/15/23: Theraband Exercises Access Code: [email or web address]  GOALS: Goals reviewed with patient? Yes  SHORT TERM GOALS: Target date: 06/28/23  Patient will demonstrate updated LUE HEP with 25% verbal cues or less for proper execution. Baseline: New to outpt OT Goal status: IN Progress  2.  Patient will demonstrate at least 5 lbs improvement with BUE grip strength as needed to open jars and other containers as well as carry objects from the car with LUE. Baseline: Right: 43.1 lbs Left: 33.3 lbs / Moderate Difficulty per QuickDash Goal status: MET 07/08/23: Right 50.7 lbs; Average: 43.6 lbs  3.  Patient will demo improved FM coordination as evidenced by no difficulty with cutting food on her own.  Baseline: Moderate Difficulty per QuickDash Goal status: MET  4. Patient will be able to safely move in the kitchen without AE for simple meal prep activities x 15+ minutes.  Baseline: Has not resumed cooking since stroke  Goal Status: IN Progress  07/08/23 - concerns about standing for awhile  07/15/23 - still the standing that limits her - still feeling off balance  5. Patient will demonstrate appropriate vision HEP to help with improved visual tracking and decreased tiredness with visual activities.  Baseline: vision affected when tired, light can make her dizzy when they are too bright  Goal Status: INITIAL   LONG TERM GOALS: Target date: 08/02/23  Patient will demonstrate updated LUE HEP with visual handouts only for proper execution. Baseline: New to outpt OT Goal status: IN Progress  2.  Patient will demonstrate increased UE strength, coordination and activity tolerance for overhead activities ie) hair care. Baseline: Subjectively reports she is unable to braid her hair,  apply lashes or do her makeup per PLOF Goal status: IN Progress 07/15/23: Pt tried and 2 braids and had more issue with her R shoulder vs L arm  3.  Patient will demonstrate at least 16% improvement with quick Dash score (reporting <35% disability or less) indicating improved functional use of affected extremity. Baseline: Quick Dash: 52.3 Goal status: INITIAL  ASSESSMENT:  CLINICAL IMPRESSION: Patient is a 63 y.o. female who was seen today for occupational therapy treatment for residual deficits s/p CVA with L sided weakness and history of R shoulder pain. Pt responded well to HEP ideas today but has tendency to protect R shoulder by holding it up (shoulder hiked) and close to her body.  She is encouraged to work on B UE activities within comfort with low resistance theraband (yellow). Pt will benefit from a couple more skilled OT visits in the outpatient setting to work on LUE impairments as well as activity tolerance to resume daily activities such as cooking etc to help pt return to PLOF.    PERFORMANCE DEFICITS: in functional skills including ADLs, IADLs, coordination, dexterity, proprioception, sensation, ROM, strength, flexibility, Fine motor control, Gross motor control, balance, cardiopulmonary status limiting function, decreased knowledge of precautions, decreased knowledge of use of DME, vision, and UE functional use, cognitive skills including attention, emotional, and problem solving, and psychosocial skills including coping strategies, environmental adaptation, habits, and routines and behaviors.   IMPAIRMENTS: are limiting patient from  ADLs, IADLs, rest and sleep, and leisure.   CO-MORBIDITIES: has co-morbidities such as newly dx DM2 and heart failure with defibrillator  that affects occupational performance. Patient will benefit from skilled OT to address above impairments and improve overall function.  REHAB POTENTIAL: Excellent   PLAN:  OT FREQUENCY: 1x/week  OT DURATION: 6  weeks - POC dates extended for holidays  PLANNED INTERVENTIONS: 97535 self care/ADL training, 02889 therapeutic exercise, 97530 therapeutic activity, 97112 neuromuscular re-education, energy conservation, coping strategies training, patient/family education, and DME and/or AE instructions  RECOMMENDED OTHER SERVICES: Pt missed P.T. evaluation prior to OT today and appt is rescheduled  CONSULTED AND AGREED WITH PLAN OF CARE: Patient  PLAN FOR NEXT SESSION:  Review and update HEPs (putty, coordination and theraband) Standing activities for ADLs Vision HEP  Clarita LITTIE Pride, OT 07/15/2023, 11:14 AM

## 2023-07-15 NOTE — Patient Instructions (Signed)
   You can place your arms out to the sides with pillows underneath or place a pillow across your stomach with your hands resting on top for support. Hold a pillow with your arms away from your body.  Do not bend your arms at the elbows or tuck your hands under your head. Do not place your hand on top or underneath pillow. Use outside of pillow as a border.

## 2023-07-16 ENCOUNTER — Ambulatory Visit (INDEPENDENT_AMBULATORY_CARE_PROVIDER_SITE_OTHER): Payer: Medicaid Other

## 2023-07-16 DIAGNOSIS — I447 Left bundle-branch block, unspecified: Secondary | ICD-10-CM

## 2023-07-16 LAB — CUP PACEART REMOTE DEVICE CHECK
Battery Remaining Longevity: 58 mo
Battery Remaining Percentage: 87 %
Battery Voltage: 2.99 V
Brady Statistic AP VP Percent: 1 %
Brady Statistic AP VS Percent: 1 %
Brady Statistic AS VP Percent: 99 %
Brady Statistic AS VS Percent: 1 %
Brady Statistic RA Percent Paced: 1 %
Date Time Interrogation Session: 20250114061211
HighPow Impedance: 59 Ohm
Implantable Lead Connection Status: 753985
Implantable Lead Connection Status: 753985
Implantable Lead Connection Status: 753985
Implantable Lead Implant Date: 20240715
Implantable Lead Implant Date: 20240715
Implantable Lead Implant Date: 20240715
Implantable Lead Location: 753858
Implantable Lead Location: 753859
Implantable Lead Location: 753860
Implantable Lead Model: 7122
Implantable Pulse Generator Implant Date: 20240715
Lead Channel Impedance Value: 450 Ohm
Lead Channel Impedance Value: 480 Ohm
Lead Channel Impedance Value: 510 Ohm
Lead Channel Pacing Threshold Amplitude: 0.625 V
Lead Channel Pacing Threshold Amplitude: 1.25 V
Lead Channel Pacing Threshold Amplitude: 2.5 V
Lead Channel Pacing Threshold Pulse Width: 0.5 ms
Lead Channel Pacing Threshold Pulse Width: 0.5 ms
Lead Channel Pacing Threshold Pulse Width: 0.8 ms
Lead Channel Sensing Intrinsic Amplitude: 12 mV
Lead Channel Sensing Intrinsic Amplitude: 2.6 mV
Lead Channel Setting Pacing Amplitude: 1.625
Lead Channel Setting Pacing Amplitude: 2.25 V
Lead Channel Setting Pacing Amplitude: 3.5 V
Lead Channel Setting Pacing Pulse Width: 0.5 ms
Lead Channel Setting Pacing Pulse Width: 0.8 ms
Lead Channel Setting Sensing Sensitivity: 0.5 mV
Pulse Gen Serial Number: 211015851
Zone Setting Status: 755011

## 2023-07-18 ENCOUNTER — Other Ambulatory Visit (HOSPITAL_COMMUNITY): Payer: Self-pay

## 2023-07-19 ENCOUNTER — Other Ambulatory Visit: Payer: Self-pay

## 2023-07-19 ENCOUNTER — Other Ambulatory Visit (HOSPITAL_COMMUNITY): Payer: Self-pay | Admitting: Pharmacist

## 2023-07-19 ENCOUNTER — Other Ambulatory Visit (HOSPITAL_COMMUNITY): Payer: Self-pay

## 2023-07-19 ENCOUNTER — Telehealth (HOSPITAL_COMMUNITY): Payer: Self-pay | Admitting: Pharmacy Technician

## 2023-07-19 MED ORDER — BISOPROLOL FUMARATE 5 MG PO TABS
2.5000 mg | ORAL_TABLET | Freq: Every day | ORAL | 3 refills | Status: DC
  Filled 2023-07-19: qty 45, 90d supply, fill #0

## 2023-07-19 NOTE — Telephone Encounter (Signed)
Advanced Heart Failure Patient Advocate Encounter  Bisoprolol PA denied. [facility]) is going to attempt to appeal. We are not 100% sure we can get an appeal approved. Called and spoke with the patient. She is aware of the plan and has agreed to pay cash while this is worked on. 90 day supply at [facility], $17.48.  Archer Asa, CPhT

## 2023-07-19 NOTE — Telephone Encounter (Signed)
Patient Advocate Encounter   Received notification from [facility] that prior authorization for Bisoprolol is required.   PA submitted on CoverMyMeds Key [id number] Status is pending   Will continue to follow.

## 2023-07-22 ENCOUNTER — Ambulatory Visit: Payer: Medicaid Other | Admitting: Physical Therapy

## 2023-07-22 ENCOUNTER — Telehealth: Payer: Self-pay | Admitting: Physical Therapy

## 2023-07-22 ENCOUNTER — Ambulatory Visit: Payer: Medicaid Other | Admitting: Occupational Therapy

## 2023-07-22 ENCOUNTER — Other Ambulatory Visit (HOSPITAL_COMMUNITY): Payer: Self-pay

## 2023-07-22 NOTE — Telephone Encounter (Signed)
Called pt in regards to No Show appt today. Had to leave voicemail and reminded pt of next PT appt.   [facility] [address] [place], [place]  [address] Phone:  [phone] Fax:  [phone]  Sherlie Ban, PT, DPT 07/22/23 10:38 AM

## 2023-07-24 ENCOUNTER — Encounter (HOSPITAL_COMMUNITY): Payer: Self-pay | Admitting: *Deleted

## 2023-07-24 NOTE — Progress Notes (Signed)
Received fax from [employer] requesting 07/09/23 OV note for disability claim, note faxed to htem at [phone]

## 2023-07-27 ENCOUNTER — Encounter: Payer: Self-pay | Admitting: Cardiovascular Disease

## 2023-07-29 ENCOUNTER — Telehealth (HOSPITAL_COMMUNITY): Payer: Self-pay | Admitting: Pharmacist

## 2023-07-29 ENCOUNTER — Ambulatory Visit: Payer: Medicaid Other | Admitting: Physical Therapy

## 2023-07-29 ENCOUNTER — Ambulatory Visit: Payer: Medicaid Other | Admitting: Occupational Therapy

## 2023-07-29 ENCOUNTER — Encounter: Payer: Self-pay | Admitting: Physical Therapy

## 2023-07-29 VITALS — BP 107/65 | HR 73

## 2023-07-29 DIAGNOSIS — R2681 Unsteadiness on feet: Secondary | ICD-10-CM

## 2023-07-29 DIAGNOSIS — R29898 Other symptoms and signs involving the musculoskeletal system: Secondary | ICD-10-CM

## 2023-07-29 DIAGNOSIS — M6281 Muscle weakness (generalized): Secondary | ICD-10-CM | POA: Diagnosis not present

## 2023-07-29 DIAGNOSIS — R278 Other lack of coordination: Secondary | ICD-10-CM

## 2023-07-29 DIAGNOSIS — R29818 Other symptoms and signs involving the nervous system: Secondary | ICD-10-CM

## 2023-07-29 DIAGNOSIS — R208 Other disturbances of skin sensation: Secondary | ICD-10-CM

## 2023-07-29 NOTE — Therapy (Signed)
OUTPATIENT PHYSICAL THERAPY NEURO TREATMENT   Patient Name: Lynn Estrada MRN: [id number] DOB:[birth year], 63 y.o., female Today's Date: 07/29/2023   PCP:  Porfirio Oar, PA   REFERRING PROVIDER: Reymundo Poll, MD  END OF SESSION:  PT End of Session - 07/29/23 1021     Visit Number 3    Number of Visits 7    Date for PT Re-Evaluation 09/06/23    Authorization Type [place] MEDICAID [employer]    PT Start Time 1019    PT Stop Time 1059    PT Time Calculation (min) 40 min    Equipment Utilized During Treatment Gait belt    Activity Tolerance Patient tolerated treatment well    Behavior During Therapy WFL for tasks assessed/performed             Past Medical History:  Diagnosis Date   Anemia    Arthritis    CHF (congestive heart failure) (HCC) 12/06/2022   EF is 20% to 25 %   COPD (chronic obstructive pulmonary disease) (HCC)    Diabetes mellitus without complication (HCC)    Eczema    GERD (gastroesophageal reflux disease)    History of adenomatous polyp of colon    08-03-2016  tubular adenoma x2 and hyperplastic   History of chronic gastritis 08/03/2016   History of ectopic pregnancy 1988   s/p  left salpingectomy   History of endometriosis    History of kidney stones    History of partial nephrectomy    right pelvis for very large stone   History of pneumonia 07/02/2016   CAP   History of sepsis    07-01-2016  sepsis w/ pyelonephritis, CAP /   12-31-2016  urosepsis w/ kidney stone obstruction   History of uterine leiomyoma    Hx of adenomatous colonic polyps 08/03/2016   2 adenomas, 1 hpp and 1 lost polyp 2018 all < 1 cm    Hyperlipidemia    Hypertension    Left ureteral stone    Myocardial infarction [facility])    Nephrolithiasis    left obstructive stone and right non-obstructive stone  per CT 12-31-2016   Urgency of urination    Past Surgical History:  Procedure Laterality Date   ABDOMINAL HYSTERECTOMY  01/20/2007   w/ Lysis Adhesions/  Left  salpingoophorectomy/  Right Salpingectomy   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   Procedure: BIV ICD INSERTION CRT-D;  Surgeon: Maurice Small, MD;  Location: [facility];  Service: Cardiovascular;  Laterality: N/A;   CHOLECYSTECTOMY  1994   and Right Partial Nephrectomy (pelvis for very large stone)   COLONOSCOPY WITH PROPOFOL N/A 03/26/2023   Procedure: COLONOSCOPY WITH PROPOFOL;  Surgeon: Iva Boop, MD;  Location: [facility];  Service: Gastroenterology;  Laterality: N/A;   CYSTOSCOPY W/ URETERAL STENT PLACEMENT Left 12/31/2016   Procedure: CYSTOSCOPY WITH RETROGRADE PYELOGRAM/ LEFT URETERAL STENT PLACEMENT;  Surgeon: Sebastian Ache, MD;  Location: [facility];  Service: Urology;  Laterality: Left;   CYSTOSCOPY WITH RETROGRADE PYELOGRAM, URETEROSCOPY AND STENT PLACEMENT Left 01/18/2017   Procedure: 1ST STAGE CYSTOSCOPY WITH RETROGRADE PYELOGRAM, URETEROSCOPY AND STENT REPLACEMENT;  Surgeon: Sebastian Ache, MD;  Location: [facility];  Service: Urology;  Laterality: Left;   CYSTOSCOPY WITH RETROGRADE PYELOGRAM, URETEROSCOPY AND STENT PLACEMENT Left 02/01/2017   Procedure: 2ND STAGE CYSTOSCOPY WITH RETROGRADE PYELOGRAM, URETEROSCOPY AND STENT REPLACEMENT;  Surgeon: Sebastian Ache, MD;  Location: [facility];  Service: Urology;  Laterality: Left;   ECTOPIC PREGNANCY SURGERY  1988   Left Salpingectomy   HOLMIUM LASER APPLICATION Left 01/18/2017   Procedure: HOLMIUM LASER APPLICATION;  Surgeon: Sebastian Ache, MD;  Location[facility];  Service: Urology;  Laterality: Left;   HOLMIUM LASER APPLICATION Left 02/01/2017   Procedure: HOLMIUM LASER APPLICATION;  Surgeon: Sebastian Ache, MD;  Location: [facility];  Service: Urology;  Laterality: Left;   LUMBAR DISC SURGERY  01/1999   right L5 -- S1   POLYPECTOMY  03/26/2023   Procedure: POLYPECTOMY;  Surgeon: Iva Boop, MD;  Location: [facility];  Service:  Gastroenterology;;   RE-EXPLORATION LUMBAR/  LAMINECTOMY AND MICRODISKECTOMY  10/14/2001   right L5 -- S1   RIGHT HEART CATH N/A 05/23/2022   Procedure: RIGHT HEART CATH;  Surgeon: Dolores Patty, MD;  Location: [facility];  Service: Cardiovascular;  Laterality: N/A;   RIGHT/LEFT HEART CATH AND CORONARY ANGIOGRAPHY N/A 07/18/2021   Procedure: RIGHT/LEFT HEART CATH AND CORONARY ANGIOGRAPHY;  Surgeon: Dolores Patty, MD;  Location: [facility];  Service: Cardiovascular;  Laterality: N/A;   TUBAL LIGATION Bilateral 10/17/1999   PPTL   UMBILICAL HERNIA REPAIR  age 23   Patient Active Problem List   Diagnosis Date Noted   Cerebrovascular accident (CVA) (HCC) 05/26/2023   Pyuria 05/25/2023   Left-sided weakness 05/24/2023   Benign neoplasm of cecum 03/26/2023   Benign neoplasm of descending colon 03/26/2023   Benign neoplasm of sigmoid colon 03/26/2023   Adhesive capsulitis of right shoulder 08/24/2022   Mass of right hand 02/22/2022   LBBB (left bundle branch block) 02/01/2022   Type 2 diabetes mellitus with hyperglycemia, without long-term current use of insulin (HCC) 01/22/2022   Hypertriglyceridemia 01/21/2022   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 10/26/2021   Chronic combined systolic and diastolic heart failure (HCC) 07/27/2021   Acute combined systolic (congestive) and diastolic (congestive) heart failure (HCC)    Hypocalcemia 07/12/2021   Elevated troponin 07/12/2021   Lipoma of hand 07/20/2020   Osteoarthritis of carpometacarpal (CMC) joint of thumb 07/20/2020   Pain in right hand 07/20/2020   Radial styloid tenosynovitis 07/20/2020   Other osteoarthritis of spine 10/23/2019   Suspected COVID-19 virus infection 06/11/2019   Acute respiratory failure with hypoxia (HCC) 06/11/2019   Multifocal pneumonia 06/11/2019   Carpal tunnel syndrome, bilateral 04/24/2019   Ureteral stone with hydronephrosis 12/31/2016   Hx of adenomatous  colonic polyps 08/03/2016   Normocytic anemia 07/10/2016   Essential hypertension 07/26/2015   Eczema 07/26/2015   Overweight (BMI 25.0-29.9) 07/26/2015    ONSET DATE: 05/27/2023  REFERRING DIAG: I63.9 (ICD-10-CM) - Cerebrovascular accident (CVA), unspecified mechanism (HCC)  THERAPY DIAG:  Other lack of coordination  Muscle weakness (generalized)  Other symptoms and signs involving the nervous system  Muscle weakness  Unsteadiness on feet  Rationale for Evaluation and Treatment: Rehabilitation  SUBJECTIVE:  SUBJECTIVE STATEMENT:  GOES BY LYNN  Reports has been working on walking at home and that has been going well. Notes legs feel like they're getting stronger. Reports one night got a soda and then ever since then, her back has been hurting. Has been taking some Tylenol.   Pt accompanied by: self  PERTINENT HISTORY: Pt admitted 05/24/23 with Lt sided weakness and slurred speech which resolved. 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 PMH: HTN, gastritis, GERD, CHF, COPD, DM, CAD, HLD   Heart failure w/defibrillator   PAIN:  Are you having pain? Yes: NPRS scale: 5/10 Pain location: Back pain, right across her back.  Pain description: Soreness  Aggravating factors: Unsure, laying down, reports it hurts just sitting  Relieving factors: Medications, movement   Vitals:   07/29/23 1025  BP: 107/65  Pulse: 73    PRECAUTIONS: ICD/Pacemaker  RED FLAGS: None   WEIGHT BEARING RESTRICTIONS: No  FALLS: Has patient fallen in last 6 months? No  LIVING ENVIRONMENT: Lives with: lives with their spouse and and her daughter  Lives in: House/apartment Stairs: No - 1 story ranch style  Has following equipment at home: Single  point cane and Environmental consultant - 2 wheeled  PLOF: Independent and Vocation/Vocational requirements: Works part time as an Ship broker at [employer]   PATIENT GOALS: Wants to strengthen her mobility in her arms and her legs   OBJECTIVE:  Note: Objective measures were completed at Evaluation unless otherwise noted.  DIAGNOSTIC FINDINGS: MRI brain 05/24/23: IMPRESSION: 1. Small acute infarct in the cortex of the posterior right cingulate gyrus (right ACA territory). 2. Tiny remote infarct in the right cerebellar hemisphere. 3. Scattered foci of T2 hyperintensity within the white matter of the cerebral hemispheres, nonspecific but may represent moderate chronic microvascular ischemic changes.    COGNITION: Overall cognitive status: Within functional limits for tasks assessed   SENSATION: WFL  COORDINATION: Heel to shin: slower and more difficult to perform with LLE     LOWER EXTREMITY MMT:    MMT Right Eval Left Eval  Hip flexion 5 3-  Hip extension    Hip abduction 4+ 4  Hip adduction 4+ 4  Hip internal rotation    Hip external rotation    Knee flexion 5 4  Knee extension 5 4  Ankle dorsiflexion 5 5  Ankle plantarflexion    Ankle inversion    Ankle eversion    (Blank rows = not tested)  BED MOBILITY:  Pt reports no difficulties   TRANSFERS: Assistive device utilized: None  Sit to stand: SBA Stand to sit: SBA Needs to use BUE support to stand from chair   STAIRS: Level of Assistance: SBA Stair Negotiation Technique: Alternating Pattern  with Bilateral Rails Number of Stairs: 4  Height of Stairs: 6"   GAIT: Gait pattern: step through pattern and decreased stride length Distance walked: Clinic distances  Assistive device utilized: None Level of assistance: SBA Comments: Pt reports having to use a cane out in the community for longer distances, pt not using a cane today.   FUNCTIONAL TESTS:  5 times sit to stand: 19.1 seconds with BUE support  10 meter walk  test: 9.1 seconds = 3.6 ft/sec    TODAY'S TREATMENT:  Therapeutic Exercise:   From elevated mat table, 10 reps sit <> stands with no UE support, an additional 10 reps holding 10# kettlebell, pt reporting 6-7/10 RPE with use of kettlebell  Holding 10# kettlebell, performed 10 reps forward step ups leading with each leg and marching contralateral leg into a march for dynamic SLS, pt more challenged lifting LLE into a march, pt reporting 6/10 RPE afterwards and needing a seated rest break  With 4# ankle weights, alternating SLS taps to 6" step 10 reps each leg, and then to 2nd step 10 reps each leg  SciFit with BUE/BLE at gear 2.5 > 1.5 for 8 minutes for strengthening, ROM, activity tolerance. Pt reporting arms getting tired, so at times took some breaks and just doing the legs only. Pt reporting RPE as 5/10  Tandem gait slowly down and back x2 reps on foam beam with trying to hold each position for 5 seconds      PATIENT EDUCATION: Education details:Continue HEP, Trying to the SciFit at the gym to work on strengthening and endurance - starting at 10-12 minutes and gradually incr the time  Person educated: Patient Education method: Programmer, multimedia, Demonstration, Verbal cues, and Handouts Education comprehension: verbalized understanding, returned demonstration, and needs further education  HOME EXERCISE PROGRAM: Access Code: [email or web address] URL: [email or web address] Date: 07/15/2023 Prepared by: Sherlie Ban  Exercises - Walking March  - 1 x daily - 5 x weekly - 3 sets - Standing Single Leg Stance with Counter Support  - 1-2 x daily - 5 x weekly - 3 sets - 15 hold - Standing Hip Abduction with Resistance at Ankles and Counter Support  - 1 x daily - 5 x weekly - 2 sets - 10 reps - Standing Hip Extension with Resistance at Ankles and Counter Support  - 1 x  daily - 5 x weekly - 2 sets - 10 reps  GOALS: Goals reviewed with patient? Yes  SHORT TERM GOALS: ALL STGS = LTGS  LONG TERM GOALS: Target date: 08/05/2023  Pt will be independent with final HEP for strength/balance in order to build upon functional gains made in therapy. Baseline: No HEP Goal status: INITIAL  2.  Pt with improve distance to at least 1320' with 3/10 RPE afterwards in order to demo improved endurance.  Baseline: 1260' with 5/10 RPE  Goal status: INITIAL  3.  Pt will improve FGA to at least a 27/30 in order to demo decr fall risk.  Baseline: 24/30 Goal status: INITIAL  4.  Pt will improve 5x sit<>stand to less than or equal to 16.5 sec to demonstrate improved functional strength and transfer efficiency.   Baseline: 19.1 seconds with BUE support  Goal status: INITIAL    ASSESSMENT:  CLINICAL IMPRESSION: Today's skilled session continued to focus on LLE>RLE strengthening and activity tolerance. Pt reporting RPE as 6/10 throughout session and most challenged with step ups with holding 10# kettlebell. Performed the SciFit today to work on strengthening/endurance, with pt planning on starting this at the [employer]. Will continue per POC.    OBJECTIVE IMPAIRMENTS: Abnormal gait, decreased activity tolerance, decreased balance, decreased coordination, decreased mobility, difficulty walking, decreased strength, and pain.   ACTIVITY LIMITATIONS: lifting, stairs, transfers, and locomotion level  PARTICIPATION LIMITATIONS: community activity  PERSONAL FACTORS: Age, Behavior pattern, Past/current experiences, Time since onset of injury/illness/exacerbation, and 3+ comorbidities: HTN, gastritis, GERD, CHF, COPD, DM, CAD, HLD   are also affecting patient's functional outcome.   REHAB POTENTIAL: Good  CLINICAL DECISION MAKING: Stable/uncomplicated  EVALUATION COMPLEXITY: Low  PLAN:  PT FREQUENCY: 1x/week  PT DURATION: 8 weeks  PLANNED INTERVENTIONS: 97164- PT  Re-evaluation, 97110-Therapeutic exercises, 97530- Therapeutic activity, 97112- Neuromuscular re-education, 97535- Self Care, 19147- Manual therapy, [phone]- Gait training, Patient/Family education, Balance training, Stair training, Vestibular training, and DME instructions  PLAN FOR NEXT SESSION: LLE strengthening and balance    Drake Leach, PT, DPT 07/29/2023, 11:53 AM    Check all possible CPT codes: 97164 - PT Re-evaluation, 97110- Therapeutic Exercise, [phone]- Neuro Re-education, [phone] - Gait Training, [phone] - Manual Therapy, [phone] - Therapeutic Activities, and [phone] - Self Care    Check all conditions that are expected to impact treatment: Diabetes mellitus and Neurological condition and/or seizures   If treatment provided at initial evaluation, no treatment charged due to lack of authorization.

## 2023-07-29 NOTE — Telephone Encounter (Signed)
Advanced Heart Failure Patient Advocate Encounter  Prior Authorization for bisoprolol was denied. Appeal submitted.  Karle Plumber, PharmD, BCPS, BCCP, CPP Heart Failure Clinic Pharmacist [phone]

## 2023-07-29 NOTE — Therapy (Signed)
OUTPATIENT OCCUPATIONAL THERAPY NEURO TREATMENT & DISCHARGE NOTE  Patient Name: Lynn Estrada MRN: [id number] DOB:1960/09/06, 63 y.o., female Today's Date: 07/29/2023  PCP: Porfirio Oar, PA REFERRING PROVIDER: Reymundo Poll, MD  END OF SESSION:  OT End of Session - 07/29/23 1100     Visit Number 4    Number of Visits 6    Date for OT Re-Evaluation 08/02/23    Authorization Type [employer]    OT Start Time 1100    OT Stop Time 1145    OT Time Calculation (min) 45 min    Equipment Utilized During Treatment testing material    Activity Tolerance Patient tolerated treatment well    Behavior During Therapy WFL for tasks assessed/performed             Past Medical History:  Diagnosis Date   Anemia    Arthritis    CHF (congestive heart failure) (HCC) 12/06/2022   EF is 20% to 25 %   COPD (chronic obstructive pulmonary disease) (HCC)    Diabetes mellitus without complication (HCC)    Eczema    GERD (gastroesophageal reflux disease)    History of adenomatous polyp of colon    08-03-2016  tubular adenoma x2 and hyperplastic   History of chronic gastritis 08/03/2016   History of ectopic pregnancy 1988   s/p  left salpingectomy   History of endometriosis    History of kidney stones    History of partial nephrectomy    right pelvis for very large stone   History of pneumonia 07/02/2016   CAP   History of sepsis    07-01-2016  sepsis w/ pyelonephritis, CAP /   12-31-2016  urosepsis w/ kidney stone obstruction   History of uterine leiomyoma    Hx of adenomatous colonic polyps 08/03/2016   2 adenomas, 1 hpp and 1 lost polyp 2018 all < 1 cm    Hyperlipidemia    Hypertension    Left ureteral stone    Myocardial infarction [facility])    Nephrolithiasis    left obstructive stone and right non-obstructive stone  per CT 12-31-2016   Urgency of urination    Past Surgical History:  Procedure Laterality Date   ABDOMINAL HYSTERECTOMY  01/20/2007   w/ Lysis  Adhesions/  Left salpingoophorectomy/  Right Salpingectomy   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   Procedure: BIV ICD INSERTION CRT-D;  Surgeon: Maurice Small, MD;  Location: [facility];  Service: Cardiovascular;  Laterality: N/A;   CHOLECYSTECTOMY  1994   and Right Partial Nephrectomy (pelvis for very large stone)   COLONOSCOPY WITH PROPOFOL N/A 03/26/2023   Procedure: COLONOSCOPY WITH PROPOFOL;  Surgeon: Iva Boop, MD;  Location: [facility];  Service: Gastroenterology;  Laterality: N/A;   CYSTOSCOPY W/ URETERAL STENT PLACEMENT Left 12/31/2016   Procedure: CYSTOSCOPY WITH RETROGRADE PYELOGRAM/ LEFT URETERAL STENT PLACEMENT;  Surgeon: Sebastian Ache, MD;  Location: [facility];  Service: Urology;  Laterality: Left;   CYSTOSCOPY WITH RETROGRADE PYELOGRAM, URETEROSCOPY AND STENT PLACEMENT Left 01/18/2017   Procedure: 1ST STAGE CYSTOSCOPY WITH RETROGRADE PYELOGRAM, URETEROSCOPY AND STENT REPLACEMENT;  Surgeon: Sebastian Ache, MD;  Location: [facility];  Service: Urology;  Laterality: Left;   CYSTOSCOPY WITH RETROGRADE PYELOGRAM, URETEROSCOPY AND STENT PLACEMENT Left 02/01/2017   Procedure: 2ND STAGE CYSTOSCOPY WITH RETROGRADE PYELOGRAM, URETEROSCOPY AND STENT REPLACEMENT;  Surgeon: Sebastian Ache, MD;  Location: [facility];  Service: Urology;  Laterality: Left;   ECTOPIC PREGNANCY SURGERY  1988  Left Salpingectomy   HOLMIUM LASER APPLICATION Left 01/18/2017   Procedure: HOLMIUM LASER APPLICATION;  Surgeon: Sebastian Ache, MD;  Location: [facility];  Service: Urology;  Laterality: Left;   HOLMIUM LASER APPLICATION Left 02/01/2017   Procedure: HOLMIUM LASER APPLICATION;  Surgeon: Sebastian Ache, MD;  Location: [facility];  Service: Urology;  Laterality: Left;   LUMBAR DISC SURGERY  01/1999   right L5 -- S1   POLYPECTOMY  03/26/2023   Procedure: POLYPECTOMY;  Surgeon: Iva Boop, MD;  Location: [facility];  Service:  Gastroenterology;;   RE-EXPLORATION LUMBAR/  LAMINECTOMY AND MICRODISKECTOMY  10/14/2001   right L5 -- S1   RIGHT HEART CATH N/A 05/23/2022   Procedure: RIGHT HEART CATH;  Surgeon: Dolores Patty, MD;  Location: [facility];  Service: Cardiovascular;  Laterality: N/A;   RIGHT/LEFT HEART CATH AND CORONARY ANGIOGRAPHY N/A 07/18/2021   Procedure: RIGHT/LEFT HEART CATH AND CORONARY ANGIOGRAPHY;  Surgeon: Dolores Patty, MD;  Location: [facility];  Service: Cardiovascular;  Laterality: N/A;   TUBAL LIGATION Bilateral 10/17/1999   PPTL   UMBILICAL HERNIA REPAIR  age 64   Patient Active Problem List   Diagnosis Date Noted   Cerebrovascular accident (CVA) (HCC) 05/26/2023   Pyuria 05/25/2023   Left-sided weakness 05/24/2023   Benign neoplasm of cecum 03/26/2023   Benign neoplasm of descending colon 03/26/2023   Benign neoplasm of sigmoid colon 03/26/2023   Adhesive capsulitis of right shoulder 08/24/2022   Mass of right hand 02/22/2022   LBBB (left bundle branch block) 02/01/2022   Type 2 diabetes mellitus with hyperglycemia, without long-term current use of insulin (HCC) 01/22/2022   Hypertriglyceridemia 01/21/2022   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 10/26/2021   Chronic combined systolic and diastolic heart failure (HCC) 07/27/2021   Acute combined systolic (congestive) and diastolic (congestive) heart failure (HCC)    Hypocalcemia 07/12/2021   Elevated troponin 07/12/2021   Lipoma of hand 07/20/2020   Osteoarthritis of carpometacarpal (CMC) joint of thumb 07/20/2020   Pain in right hand 07/20/2020   Radial styloid tenosynovitis 07/20/2020   Other osteoarthritis of spine 10/23/2019   Suspected COVID-19 virus infection 06/11/2019   Acute respiratory failure with hypoxia (HCC) 06/11/2019   Multifocal pneumonia 06/11/2019   Carpal tunnel syndrome, bilateral 04/24/2019   Ureteral stone with hydronephrosis 12/31/2016   Hx of adenomatous  colonic polyps 08/03/2016   Normocytic anemia 07/10/2016   Essential hypertension 07/26/2015   Eczema 07/26/2015   Overweight (BMI 25.0-29.9) 07/26/2015    ONSET DATE: Referral: 05/27/2023 Hospitalized: 11/22 - 05/27/23  REFERRING DIAG: I63.9 (ICD-10-CM) - Cerebrovascular accident (CVA), unspecified mechanism  THERAPY DIAG:  Other lack of coordination  Muscle weakness (generalized)  Other disturbances of skin sensation  Other symptoms and signs involving the musculoskeletal system  Other symptoms and signs involving the nervous system  Rationale for Evaluation and Treatment: Rehabilitation  SUBJECTIVE:   SUBJECTIVE STATEMENT:  Pt reported that her back and R shoulder have been hurting ie) 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  Pt reports that she still has been doing her stretches - in lying down (up and down and out to the side),  curls, and pulling band down from the side.  Pt also noted that she has been having pain across her low back for a few days since she had a soda a few days ago, so she has been drining lots of water to flush it out.  Pt reported that  she made dinner this weekend ie) baked chicken, steamed broccoli, rice in a bag and sliced fruit.  Pt has met her OT goals and is in agreement with DC from OT today, therefore her last appt with OT is cancelled for next week.   Pt accompanied by: self  PERTINENT HISTORY:   Presented to ED with left-sided weakness and aphasia.  Pt hospitalized x 3 days.   Discharge from [facility] had follow up info listed below:  CVA: Per neurology wanted aspirin 81 mg and Plavix 75 mg for 3 weeks then Plavix alone.  Patient discharged with 81 mg of aspirin for 19 days.  Lipitor 10 mg daily with LDL less than 70 at goal.  Physical therapy and Occupational Therapy referrals are sent. Heart failure w/defibrillator: Initially we held her Lasix and Entresto in this hospitalization because of softer blood pressure and she became orthostatic;  discharged her with holding spironolactone, resuming back her Entresto and Lasix.  Please evaluate her blood pressure and start her medications, GDMT accordingly. Left upper abdominal pain/costochondritis: Discharged her with some topical ointment and Lidoderm patches. Type 2 diabetes: Newly diagnosed in this inpatient, A1c 7.1; discharged home with metformin 500 mg, the first 2 weeks then titrating to 500 mg twice daily. UTI: Discharge her with cefadroxil 500 mg BID for 3 days, holding her Jardiance until she finishes her antibiotic course.  Please check her UA.  PRECAUTIONS: Other: Heart Defibrillator since 08/2022  WEIGHT BEARING RESTRICTIONS: No  PAIN:  Are you having pain? Yes: NPRS scale: number not given Pain location: R shoulder and low back Pain description: aching Aggravating factors: using R arm above shoulder height Relieving factors: rest and UE support per sleep position recommendations  -   FALLS: Has patient fallen in last 6 months? No  LIVING ENVIRONMENT: Lives with: lives with their family Lives in: House/apartment Stairs: Yes: External: 1 steps; none Has following equipment at home: Single point cane and Walker - 2 wheeled  PLOF: Independent & driving, part-time work at the [employer] but not since stroke  PATIENT GOALS: Get my strength back up,   OBJECTIVE:  Note: Objective measures were completed at Evaluation unless otherwise noted.  HAND DOMINANCE: Right  ADLs: Overall ADLs: Generally Ind but slower than before Transfers/ambulation related to ADLs: Eating: Ind Grooming: Can't braid hair, apply lashes or do her makeup per PLOF UB Dressing: Ind LB Dressing: Ind Toileting: Ind Bathing: Showers in standing, nothing to hold onto [employer] transfers: Husband present and helps - has garden Warden/ranger: none  IADLs: Just started driving yesterday Shopping: helping but not the endurance with walking Light housekeeping: A [employer] but not like before,  hasn't done laundry yet Meal Prep: Not yet Community mobility: using cane Medication management: uses pill box Financial management: Ind Handwriting: Not diffiuclty (R handed)  MOBILITY STATUS: Independent  POSTURE COMMENTS:  No Significant postural limitations Sitting balance: WFL  ACTIVITY TOLERANCE: Activity tolerance: Naps 2x/day - doesn't sleep well at night since the stroke  FUNCTIONAL OUTCOME MEASURES: Eval Quick Dash: 52.3 Discharge: 31.8  UPPER EXTREMITY ROM:    Active ROM Right eval Left eval  Shoulder flexion <90* frozen shoulder June Slight end range limits  Shoulder abduction    Shoulder adduction    Shoulder extension    Shoulder internal rotation    Shoulder external rotation    Elbow flexion    Elbow extension    Wrist flexion    Wrist extension    Wrist ulnar  deviation    Wrist radial deviation    Wrist pronation    Wrist supination    (Blank rows = not tested)  UPPER EXTREMITY MMT:     MMT Right eval Left eval Left  Discharge  Shoulder flexion     Shoulder abduction     Shoulder adduction     Shoulder extension     Shoulder internal rotation     Shoulder external rotation     Middle trapezius     Lower trapezius     Elbow flexion 4+ 3+ 4+  Elbow extension 4+ 3+ 4+  Wrist flexion     Wrist extension     Wrist ulnar deviation     Wrist radial deviation     Wrist pronation     Wrist supination     (Blank rows = not tested)  HAND FUNCTION: Grip strength: Right: 41.4, 44.0, 43.8  lbs; Left: 31.9, 36.5, 31.5 lbs Average: Right: 43.1 lbs Left: 33.3 lbs  07/08/23 Right 41.6 44.3, 50.7, 53.5, 63.4 Average: 50.7 lbs Left 30.8, 41.2, 44.0, 57.9, Average: 43.6 lbs  COORDINATION: 9 Hole Peg test: Right: 25.47 sec; Left: 28.56 sec  SENSATION: WFL  EDEMA: NA  MUSCLE TONE: WFL  COGNITION: Overall cognitive status: Within functional limits for tasks assessed  VISION: Subjective report: Noticing [place] changes in vision - vision  affected when tired, light can make her dizzy when they are too bright Baseline vision: Wears glasses all the time - progressive lenses Visual history: NA  VISION ASSESSMENT: Tracking/Visual pursuits: Decreased smoothness with horizontal tracking and Decreased smoothness with vertical tracking  Patient has difficulty with following activities due to following visual impairments: gets tired with visual changes   OBSERVATIONS at Eval: Pt is a tall AA female who ambulated with use of a cane today which she was not using prior to the stroke. No loss of balance but she was slow with ambulation and careful with transition to/from chair. The pt appears well kept and has jacket donned, which she was able to remove herself and don with min assist.   TODAY'S TREATMENT:                                                                                                                               Therapeutic Activities   Reviewed HEP ideas and DC instructions with reassessment of OT goals for strength and UE use - see goals below for updates on grip, Quick Dash etc. Pt reports good success with sleep position recommendations with further options explored to help keep R UE supported and to try and stay on her L side to avoid pinning her R arm under her.  Reviewed HEPs for UE strength (putty and theraband for LUE) and AAROM for RUE as tolerated.  Reviewed joint protection, progression of ADLs/IADLs and pt verbalized understanding and in agreement with DC from OT today.   PATIENT EDUCATION: Education details: DC instructions Person educated:  Patient Education method: Explanation and Verbal cues Education comprehension: verbalized understanding and verbal cues required  HOME EXERCISE PROGRAM: 07/08/23: Putty Activities Access Code [email or web address]; Coordination activities with images 07/15/23: Theraband Exercises Access Code: [email or web address]  GOALS: Goals reviewed with patient? Yes  SHORT TERM GOALS: Target date:  06/28/23  Patient will demonstrate updated LUE HEP with 25% verbal cues or less for proper execution. Baseline: New to outpt OT Goal status: MET  2.  Patient will demonstrate at least 5 lbs improvement with BUE grip strength as needed to open jars and other containers as well as carry objects from the car with LUE. Baseline: Right: 43.1 lbs Left: 33.3 lbs / Moderate Difficulty per QuickDash Goal status: MET 07/08/23: Average :Right 50.7 lbs; Left: 43.6 lbs 1/27/2  Left 47, 40, 45 Average: 44 lbs  3.  Patient will demo improved FM coordination as evidenced by no difficulty with cutting food on her own.  Baseline: Moderate Difficulty per QuickDash Goal status: MET  4. Patient will be able to safely move in the kitchen without AE for simple meal prep activities x 15+ minutes.  Baseline: Has not resumed cooking since stroke  Goal Status: MET  07/08/23 - concerns about standing for awhile  07/15/23 - still the standing that limits her - still feeling off balance  5. Patient will demonstrate appropriate vision HEP to help with improved visual tracking and decreased tiredness with visual activities.  Baseline: vision affected when tired, light can make her dizzy when they are too bright  Goal Status: MET   LONG TERM GOALS: Target date: 08/02/23  Patient will demonstrate updated LUE HEP with visual handouts only for proper execution. Baseline: New to outpt OT Goal status: MET  2.  Patient will demonstrate increased UE strength, coordination and activity tolerance for overhead activities ie) hair care. Baseline: Subjectively reports she is unable to braid her hair, apply lashes or do her makeup per PLOF Goal status: MET 07/15/23: Pt tried and 2 braids and had more issue with her R shoulder vs L arm 07/29/23: Pt braided her hair this weekend.  3.  Patient will demonstrate at least 16% improvement with quick Dash score (reporting <35% disability or less) indicating improved functional use of  affected extremity. Baseline: Quick Dash: 52.3 Goal status: MET 07/29/23: 31.8 (more difficulty with chronic R shoulder issues that may require surgery than L UE s/p CVA)  ASSESSMENT:  CLINICAL IMPRESSION: Patient is a 63 y.o. female who was seen today for occupational therapy treatment for residual deficits s/p CVA with L sided weakness and history of R shoulder pain. Pt responded well to various HEP ideas and has resumed various daily activities such as cooking.  Pt has met all her goals and feels as if she has returned to PLOF.    PERFORMANCE DEFICITS: in functional skills including ADLs, IADLs, coordination, dexterity, proprioception, sensation, ROM, strength, flexibility, Fine motor control, Gross motor control, balance, cardiopulmonary status limiting function, decreased knowledge of precautions, decreased knowledge of use of DME, vision, and UE functional use, cognitive skills including attention, emotional, and problem solving, and psychosocial skills including coping strategies, environmental adaptation, habits, and routines and behaviors.   IMPAIRMENTS: are limiting patient from ADLs, IADLs, rest and sleep, and leisure.   CO-MORBIDITIES: has co-morbidities such as newly dx DM2 and heart failure with defibrillator  that affects occupational performance. Patient will benefit from skilled OT to address above impairments and improve overall function.  REHAB POTENTIAL: Excellent   PLAN:  OCCUPATIONAL THERAPY DISCHARGE  SUMMARY  Visits from Start of Care: 4  Current functional level related to goals / functional outcomes: Pt has met all goals (5/5 short term and 3/3 long term goals) to satisfactory levels and is pleased with outcomes.   Remaining deficits: Pt has no more significant functional deficits or pain r/t CVA and LUE deficits but has chronic R shoulder pain with history of frozen shoulder.  Education / Equipment: Pt has all needed materials and education. Pt understands how  to continue on with self-management. See tx notes for more details.   Patient agrees to discharge due to max benefits received from outpatient occupational therapy at this time.    Victorino Sparrow, OT 07/29/2023, 6:41 PM

## 2023-08-04 ENCOUNTER — Emergency Department (HOSPITAL_COMMUNITY): Payer: Medicaid Other

## 2023-08-04 ENCOUNTER — Encounter (HOSPITAL_COMMUNITY): Payer: Self-pay

## 2023-08-04 ENCOUNTER — Emergency Department (HOSPITAL_COMMUNITY)
Admission: EM | Admit: 2023-08-04 | Discharge: 2023-08-04 | Disposition: A | Discharge status: Home / Self Care | Payer: Medicaid Other | Attending: Student | Admitting: Student

## 2023-08-04 ENCOUNTER — Other Ambulatory Visit: Payer: Self-pay

## 2023-08-04 DIAGNOSIS — Z79899 Other long term (current) drug therapy: Secondary | ICD-10-CM | POA: Insufficient documentation

## 2023-08-04 DIAGNOSIS — Z20822 Contact with and (suspected) exposure to covid-19: Secondary | ICD-10-CM | POA: Insufficient documentation

## 2023-08-04 DIAGNOSIS — K529 Noninfective gastroenteritis and colitis, unspecified: Secondary | ICD-10-CM

## 2023-08-04 DIAGNOSIS — J449 Chronic obstructive pulmonary disease, unspecified: Secondary | ICD-10-CM | POA: Diagnosis not present

## 2023-08-04 DIAGNOSIS — I110 Hypertensive heart disease with heart failure: Secondary | ICD-10-CM | POA: Diagnosis not present

## 2023-08-04 DIAGNOSIS — N3000 Acute cystitis without hematuria: Secondary | ICD-10-CM

## 2023-08-04 DIAGNOSIS — Z87891 Personal history of nicotine dependence: Secondary | ICD-10-CM | POA: Diagnosis not present

## 2023-08-04 DIAGNOSIS — M791 Myalgia, unspecified site: Secondary | ICD-10-CM | POA: Insufficient documentation

## 2023-08-04 DIAGNOSIS — R112 Nausea with vomiting, unspecified: Secondary | ICD-10-CM | POA: Diagnosis not present

## 2023-08-04 DIAGNOSIS — Z91040 Latex allergy status: Secondary | ICD-10-CM | POA: Insufficient documentation

## 2023-08-04 DIAGNOSIS — Z85038 Personal history of other malignant neoplasm of large intestine: Secondary | ICD-10-CM | POA: Diagnosis not present

## 2023-08-04 DIAGNOSIS — R109 Unspecified abdominal pain: Secondary | ICD-10-CM | POA: Diagnosis present

## 2023-08-04 DIAGNOSIS — I5041 Acute combined systolic (congestive) and diastolic (congestive) heart failure: Secondary | ICD-10-CM | POA: Insufficient documentation

## 2023-08-04 DIAGNOSIS — Z7984 Long term (current) use of oral hypoglycemic drugs: Secondary | ICD-10-CM | POA: Insufficient documentation

## 2023-08-04 DIAGNOSIS — Z955 Presence of coronary angioplasty implant and graft: Secondary | ICD-10-CM | POA: Insufficient documentation

## 2023-08-04 DIAGNOSIS — E119 Type 2 diabetes mellitus without complications: Secondary | ICD-10-CM | POA: Insufficient documentation

## 2023-08-04 LAB — COMPREHENSIVE METABOLIC PANEL
ALT: 27 U/L (ref 0–44)
AST: 26 U/L (ref 15–41)
Albumin: 4.1 g/dL (ref 3.5–5.0)
Alkaline Phosphatase: 46 U/L (ref 38–126)
Anion gap: 11 (ref 5–15)
BUN: 23 mg/dL (ref 8–23)
CO2: 25 mmol/L (ref 22–32)
Calcium: 9.1 mg/dL (ref 8.9–10.3)
Chloride: 102 mmol/L (ref 98–111)
Creatinine, Ser: 1.26 mg/dL — ABNORMAL HIGH (ref 0.44–1.00)
GFR, Estimated: 49 mL/min — ABNORMAL LOW (ref >60)
Glucose, Bld: 113 mg/dL — ABNORMAL HIGH (ref 70–99)
Potassium: 3.8 mmol/L (ref 3.5–5.1)
Sodium: 138 mmol/L (ref 135–145)
Total Bilirubin: 0.8 mg/dL (ref 0.0–1.2)
Total Protein: 7.6 g/dL (ref 6.5–8.1)

## 2023-08-04 LAB — URINALYSIS, ROUTINE W REFLEX MICROSCOPIC
Bacteria, UA: NONE SEEN
Bilirubin Urine: NEGATIVE
Glucose, UA: 50 mg/dL — AB
Hgb urine dipstick: NEGATIVE
Ketones, ur: NEGATIVE mg/dL
Nitrite: NEGATIVE
Protein, ur: NEGATIVE mg/dL
Specific Gravity, Urine: 1.029 (ref 1.005–1.030)
pH: 5 (ref 5.0–8.0)

## 2023-08-04 LAB — LIPASE, BLOOD: Lipase: 53 U/L — ABNORMAL HIGH (ref 11–51)

## 2023-08-04 LAB — CBC
HCT: 39.1 % (ref 36.0–46.0)
Hemoglobin: 12.5 g/dL (ref 12.0–15.0)
MCH: 29 pg (ref 26.0–34.0)
MCHC: 32 g/dL (ref 30.0–36.0)
MCV: 90.7 fL (ref 80.0–100.0)
Platelets: 236 10*3/uL (ref 150–400)
RBC: 4.31 MIL/uL (ref 3.87–5.11)
RDW: 13.8 % (ref 11.5–15.5)
WBC: 3.8 10*3/uL — ABNORMAL LOW (ref 4.0–10.5)
nRBC: 0 % (ref 0.0–0.2)

## 2023-08-04 LAB — RESP PANEL BY RT-PCR (RSV, FLU A&B, COVID)  RVPGX2
Influenza A by PCR: NEGATIVE
Influenza B by PCR: NEGATIVE
Resp Syncytial Virus by PCR: NEGATIVE
SARS Coronavirus 2 by RT PCR: NEGATIVE

## 2023-08-04 LAB — LACTIC ACID, PLASMA: Lactic Acid, Venous: 0.8 mmol/L (ref 0.5–1.9)

## 2023-08-04 MED ORDER — ACETAMINOPHEN 500 MG PO TABS
1000.0000 mg | ORAL_TABLET | Freq: Once | ORAL | Status: AC
  Administered 2023-08-04: 1000 mg via ORAL
  Filled 2023-08-04: qty 2

## 2023-08-04 MED ORDER — LACTATED RINGERS IV BOLUS
500.0000 mL | Freq: Once | INTRAVENOUS | Status: AC
  Administered 2023-08-04: 500 mL via INTRAVENOUS

## 2023-08-04 MED ORDER — ONDANSETRON HCL 4 MG/2ML IJ SOLN
4.0000 mg | Freq: Once | INTRAMUSCULAR | Status: AC
  Administered 2023-08-04: 4 mg via INTRAVENOUS
  Filled 2023-08-04: qty 2

## 2023-08-04 MED ORDER — FOSFOMYCIN TROMETHAMINE 3 G PO PACK
3.0000 g | PACK | Freq: Once | ORAL | Status: AC
  Administered 2023-08-04: 3 g via ORAL
  Filled 2023-08-04: qty 3

## 2023-08-04 MED ORDER — LACTATED RINGERS IV BOLUS
1000.0000 mL | Freq: Once | INTRAVENOUS | Status: AC
Start: 2023-08-04 — End: 2023-08-04
  Administered 2023-08-04: 1000 mL via INTRAVENOUS

## 2023-08-04 MED ORDER — ONDANSETRON 4 MG PO TBDP: 4.0000 mg | ORAL_TABLET | Freq: Once | ORAL | Status: DC | PRN

## 2023-08-04 MED ORDER — IOHEXOL 300 MG/ML  SOLN
100.0000 mL | Freq: Once | INTRAMUSCULAR | Status: AC | PRN
  Administered 2023-08-04: 100 mL via INTRAVENOUS

## 2023-08-04 MED ORDER — ONDANSETRON 4 MG PO TBDP
4.0000 mg | ORAL_TABLET | Freq: Three times a day (TID) | ORAL | 0 refills | Status: DC | PRN
Start: 2023-08-04 — End: 2023-08-27

## 2023-08-04 NOTE — ED Triage Notes (Signed)
Patient reports nausea, vomiting, cough, fatigue, and body aches x 4 days. Denies fevers and diarrhea.

## 2023-08-04 NOTE — ED Provider Notes (Signed)
[place] EMERGENCY DEPARTMENT AT [facility] Provider Note  CSN: [id number] Arrival date & time: 08/04/23 1426  Chief Complaint(s) Emesis  HPI Lynn Estrada is a 63 y.o. female with PMH CAD status post MI, CHF with EF 20 to 25% with pacemaker in place, recent hospitalization in November 2024 for a new CVA, T2DM, asthma, COPD who presents emergency room for evaluation of myalgias, nausea, vomiting and abdominal pain.  States that symptoms have been worsening over the last 4 days and she has been unable to tolerate solid foods.  Has been able to tolerate a small amount of liquids.  Abdominal pain worse in the epigastrium.  Denies associated chest pain, shortness of breath, headache, fever or other systemic symptoms.  Patient arrives hypotensive with systolics in the 80s.   Past Medical History Past Medical History:  Diagnosis Date   Anemia    Arthritis    CHF (congestive heart failure) (HCC) 12/06/2022   EF is 20% to 25 %   COPD (chronic obstructive pulmonary disease) (HCC)    Diabetes mellitus without complication (HCC)    Eczema    GERD (gastroesophageal reflux disease)    History of adenomatous polyp of colon    08-03-2016  tubular adenoma x2 and hyperplastic   History of chronic gastritis 08/03/2016   History of ectopic pregnancy 1988   s/p  left salpingectomy   History of endometriosis    History of kidney stones    History of partial nephrectomy    right pelvis for very large stone   History of pneumonia 07/02/2016   CAP   History of sepsis    07-01-2016  sepsis w/ pyelonephritis, CAP /   12-31-2016  urosepsis w/ kidney stone obstruction   History of uterine leiomyoma    Hx of adenomatous colonic polyps 08/03/2016   2 adenomas, 1 hpp and 1 lost polyp 2018 all < 1 cm    Hyperlipidemia    Hypertension    Left ureteral stone    Myocardial infarction (HCC)    Nephrolithiasis    left obstructive stone and right non-obstructive stone  per CT 12-31-2016    Urgency of urination    Patient Active Problem List   Diagnosis Date Noted   Cerebrovascular accident (CVA) (HCC) 05/26/2023   Pyuria 05/25/2023   Left-sided weakness 05/24/2023   Benign neoplasm of cecum 03/26/2023   Benign neoplasm of descending colon 03/26/2023   Benign neoplasm of sigmoid colon 03/26/2023   Adhesive capsulitis of right shoulder 08/24/2022   Mass of right hand 02/22/2022   LBBB (left bundle branch block) 02/01/2022   Type 2 diabetes mellitus with hyperglycemia, without long-term current use of insulin (HCC) 01/22/2022   Hypertriglyceridemia 01/21/2022   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 10/26/2021   Chronic combined systolic and diastolic heart failure (HCC) 07/27/2021   Acute combined systolic (congestive) and diastolic (congestive) heart failure (HCC)    Hypocalcemia 07/12/2021   Elevated troponin 07/12/2021   Lipoma of hand 07/20/2020   Osteoarthritis of carpometacarpal (CMC) joint of thumb 07/20/2020   Pain in right hand 07/20/2020   Radial styloid tenosynovitis 07/20/2020   Other osteoarthritis of spine 10/23/2019   Suspected COVID-19 virus infection 06/11/2019   Acute respiratory failure with hypoxia (HCC) 06/11/2019   Multifocal pneumonia 06/11/2019   Carpal tunnel syndrome, bilateral 04/24/2019   Ureteral stone with hydronephrosis 12/31/2016   Hx of adenomatous colonic polyps 08/03/2016   Normocytic anemia 07/10/2016   Essential hypertension 07/26/2015  Eczema 07/26/2015   Overweight (BMI 25.0-29.9) 07/26/2015   Home Medication(s) Prior to Admission medications   Medication Sig Start Date End Date Taking? Authorizing Provider  acetaminophen (TYLENOL) 500 MG tablet Take 500-1,000 mg by mouth every 6 (six) hours as needed (for pain.).    [provider]  atorvastatin (LIPITOR) 10 MG tablet Take 10 mg by mouth daily. 05/15/21   [provider]  bisoprolol (ZEBETA) 5 MG tablet Take 0.5 tablets (2.5 mg  total) by mouth daily. 07/19/23   Bensimhon, Bevelyn Buckles, MD  calcium carbonate (TUMS - DOSED IN MG ELEMENTAL CALCIUM) 500 MG chewable tablet Chew 1 tablet (200 mg of elemental calcium total) by mouth 2 (two) times daily with a meal. 05/27/23   Tawkaliyar, Roya, DO  calcium carbonate (TUMS) 500 MG chewable tablet Chew 1 tablet by mouth as needed for indigestion or heartburn.    [provider]  clopidogrel (PLAVIX) 75 MG tablet Take 1 tablet (75 mg total) by mouth daily. 06/24/23 06/18/24  Butch Penny, NP  digoxin (LANOXIN) 0.125 MG tablet TAKE 1 TABLET BY MOUTH EVERY DAY 03/12/23   Bensimhon, Bevelyn Buckles, MD  empagliflozin (JARDIANCE) 10 MG TABS tablet Take 1 tablet (10 mg total) by mouth daily. 06/03/23   Tawkaliyar, Roya, DO  furosemide (LASIX) 40 MG tablet Take 40 mg by mouth daily.    [provider]  lidocaine (LIDODERM) 5 % Place 1 patch onto the skin daily. Remove & Discard patch within 12 hours or as directed by MD 05/27/23   Tawkaliyar, Roya, DO  lidocaine (LMX) 4 % cream Apply topically 4 (four) times daily as needed (Left rib pain). 05/27/23   Tawkaliyar, Roya, DO  metFORMIN (GLUCOPHAGE) 500 MG tablet Take 1 tablet (500 mg total) by mouth 2 (two) times daily with a meal. Patient taking differently: Take 250 mg by mouth daily. 05/27/23 06/26/23  Jeral Pinch, DO  Multiple Vitamin (MULTIVITAMIN WITH MINERALS) TABS tablet Take 1 tablet by mouth daily with lunch. One A Day for Women 50+    [provider]  sacubitril-valsartan (ENTRESTO) 24-26 MG TAKE 1 TABLET BY MOUTH TWICE A DAY 06/05/23   Bensimhon, Bevelyn Buckles, MD                                                                                                                                    Past Surgical History Past Surgical History:  Procedure Laterality Date   ABDOMINAL HYSTERECTOMY  01/20/2007   w/ Lysis Adhesions/  Left salpingoophorectomy/  Right Salpingectomy   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    Procedure: BIV ICD INSERTION CRT-D;  Surgeon: Maurice Small, MD;  Location: [facility] INVASIVE CV LAB;  Service: Cardiovascular;  Laterality: N/A;   CHOLECYSTECTOMY  1994   and Right Partial Nephrectomy (pelvis for very large stone)   COLONOSCOPY WITH PROPOFOL N/A 03/26/2023   Procedure: COLONOSCOPY WITH PROPOFOL;  Surgeon: Iva Boop,  MD;  Location: [facility] ENDOSCOPY;  Service: Gastroenterology;  Laterality: N/A;   CYSTOSCOPY W/ URETERAL STENT PLACEMENT Left 12/31/2016   Procedure: CYSTOSCOPY WITH RETROGRADE PYELOGRAM/ LEFT URETERAL STENT PLACEMENT;  Surgeon: Sebastian Ache, MD;  Location: [facility] ORS;  Service: Urology;  Laterality: Left;   CYSTOSCOPY WITH RETROGRADE PYELOGRAM, URETEROSCOPY AND STENT PLACEMENT Left 01/18/2017   Procedure: 1ST STAGE CYSTOSCOPY WITH RETROGRADE PYELOGRAM, URETEROSCOPY AND STENT REPLACEMENT;  Surgeon: Sebastian Ache, MD;  Location: [facility];  Service: Urology;  Laterality: Left;   CYSTOSCOPY WITH RETROGRADE PYELOGRAM, URETEROSCOPY AND STENT PLACEMENT Left 02/01/2017   Procedure: 2ND STAGE CYSTOSCOPY WITH RETROGRADE PYELOGRAM, URETEROSCOPY AND STENT REPLACEMENT;  Surgeon: Sebastian Ache, MD;  Location: [facility];  Service: Urology;  Laterality: Left;   ECTOPIC PREGNANCY SURGERY  1988   Left Salpingectomy   HOLMIUM LASER APPLICATION Left 01/18/2017   Procedure: HOLMIUM LASER APPLICATION;  Surgeon: Sebastian Ache, MD;  Location: [facility];  Service: Urology;  Laterality: Left;   HOLMIUM LASER APPLICATION Left 02/01/2017   Procedure: HOLMIUM LASER APPLICATION;  Surgeon: Sebastian Ache, MD;  Location: [facility];  Service: Urology;  Laterality: Left;   LUMBAR DISC SURGERY  01/1999   right L5 -- S1   POLYPECTOMY  03/26/2023   Procedure: POLYPECTOMY;  Surgeon: Iva Boop, MD;  Location: [facility];  Service: Gastroenterology;;   RE-EXPLORATION LUMBAR/  LAMINECTOMY AND MICRODISKECTOMY  10/14/2001   right L5  -- S1   RIGHT HEART CATH N/A 05/23/2022   Procedure: RIGHT HEART CATH;  Surgeon: Dolores Patty, MD;  Location: [facility] INVASIVE CV LAB;  Service: Cardiovascular;  Laterality: N/A;   RIGHT/LEFT HEART CATH AND CORONARY ANGIOGRAPHY N/A 07/18/2021   Procedure: RIGHT/LEFT HEART CATH AND CORONARY ANGIOGRAPHY;  Surgeon: Dolores Patty, MD;  Location: [facility] INVASIVE CV LAB;  Service: Cardiovascular;  Laterality: N/A;   TUBAL LIGATION Bilateral 10/17/1999   PPTL   UMBILICAL HERNIA REPAIR  age 63   Family History Family History  Problem Relation Age of Onset   Sarcoidosis Mother    Prostate cancer Father    Hypertension Sister    Eczema Sister    Lupus Sister    Breast cancer Maternal Aunt    Gout Maternal Grandmother    Diabetes Maternal Grandmother        leg amputations   Stomach cancer Neg Hx    Colon cancer Neg Hx    Esophageal cancer Neg Hx    Rectal cancer Neg Hx     Social History Social History   Tobacco Use   Smoking status: Former    Current packs/day: 0.00    Average packs/day: 0.8 packs/day for 5.0 years (3.8 ttl pk-yrs)    Types: Cigarettes    Start date: 01/11/1992    Quit date: 01/10/1997    Years since quitting: 26.5   Smokeless tobacco: Never  Vaping Use   Vaping status: Never Used  Substance Use Topics   Alcohol use: Not Currently    Comment: social use; once a month   Drug use: No   Allergies Latex, Bactrim [sulfamethoxazole-trimethoprim], Carvedilol, and Metoprolol  Review of Systems Review of Systems  Gastrointestinal:  Positive for abdominal pain, nausea and vomiting.    Physical Exam Vital Signs  I have reviewed the triage vital signs BP (!) 84/58   Pulse 83   Temp 98.6 F (37 C) (Oral)   Resp 16   Ht 6\' 2"  (1.88 m)   Wt 93 kg  SpO2 100%   BMI 26.32 kg/m   Physical Exam Vitals and nursing note reviewed.  Constitutional:      General: She is not in acute distress.    Appearance: She is well-developed.  HENT:     Head:  Normocephalic and atraumatic.  Eyes:     Conjunctiva/sclera: Conjunctivae normal.  Cardiovascular:     Rate and Rhythm: Normal rate and regular rhythm.     Heart sounds: No murmur heard. Pulmonary:     Effort: Pulmonary effort is normal. No respiratory distress.     Breath sounds: Normal breath sounds.  Abdominal:     Palpations: Abdomen is soft.     Tenderness: There is no abdominal tenderness.  Musculoskeletal:        General: No swelling.     Cervical back: Neck supple.  Skin:    General: Skin is warm and dry.     Capillary Refill: Capillary refill takes less than 2 seconds.  Neurological:     Mental Status: She is alert.  Psychiatric:        Mood and Affect: Mood normal.     ED Results and Treatments Labs (all labs ordered are listed, but only abnormal results are displayed) Labs Reviewed  LIPASE, BLOOD - Abnormal; Notable for the following components:      Result Value   Lipase 53 (*)    All other components within normal limits  COMPREHENSIVE METABOLIC PANEL - Abnormal; Notable for the following components:   Glucose, Bld 113 (*)    Creatinine, Ser 1.26 (*)    GFR, Estimated 49 (*)    All other components within normal limits  CBC - Abnormal; Notable for the following components:   WBC 3.8 (*)    All other components within normal limits  RESP PANEL BY RT-PCR (RSV, FLU A&B, COVID)  RVPGX2  URINALYSIS, ROUTINE W REFLEX MICROSCOPIC  LACTIC ACID, PLASMA  I-STAT CHEM 8, ED                                                                                                                          Radiology No results found.  Pertinent labs & imaging results that were available during my care of the patient were reviewed by me and considered in my medical decision making (see MDM for details).  Medications Ordered in ED Medications  ondansetron (ZOFRAN-ODT) disintegrating tablet 4 mg (has no administration in time range)  lactated ringers bolus 500 mL (500 mLs  Intravenous New Bag/Given 08/04/23 1457)  lactated ringers bolus 1,000 mL (1,000 mLs Intravenous New Bag/Given 08/04/23 1549)  iohexol (OMNIPAQUE) 300 MG/ML solution 100 mL (100 mLs Intravenous Contrast Given 08/04/23 1550)  ondansetron (ZOFRAN) injection 4 mg (4 mg Intravenous Given 08/04/23 1549)  Procedures .Critical Care  Performed by: Glendora Score, MD Authorized by: Glendora Score, MD   Critical care provider statement:    Critical care time (minutes):  30   Critical care was necessary to treat or prevent imminent or life-threatening deterioration of the following conditions:  Dehydration   Critical care was time spent personally by me on the following activities:  Development of treatment plan with patient or surrogate, discussions with consultants, evaluation of patient's response to treatment, examination of patient, ordering and review of laboratory studies, ordering and review of radiographic studies, ordering and performing treatments and interventions, pulse oximetry, re-evaluation of patient's condition and review of old charts   (including critical care time)  Medical Decision Making / ED Course   This patient presents to the ED for concern of abdominal pain, nausea, vomiting, this involves an extensive number of treatment options, and is a complaint that carries with it a high risk of complications and morbidity.  The differential diagnosis includes viral gastroenteritis, COVID-19, influenza, RSV, intra-abdominal infection, UTI, pyelonephritis, GERD/gastritis, peptic ulcer disease, pancreatitis, gastroparesis, pneumonia, pleurisy, pericarditis  MDM: Patient seen emergency room for evaluation of abdominal pain nausea and vomiting.  For exam with tenderness in the epigastrium but is otherwise unremarkable.  Laboratory evaluation with a leukopenia to 3.8,  creatinine 1.26, lipase 53 but is otherwise unremarkable.  Lactic acid is normal.  Patient arrives hypotensive with systolics in the low 80s but patient was appropriately fluid responsive and received 2 slow infusions of 500 cc boluses.  (Note there is an order for a 1 L bolus but this was paused prior to being administered due to patient's low EF).  CT abdomen pelvis with a suspicion of punctate foci of gas in the right renal collecting system possibly indicative of infection but is otherwise unremarkable.  Atelectasis at the bases.  Patient given Zofran and after fluid resuscitation symptoms appear to have significant improved.  Her COVID, flu, RSV is negative.  Urinalysis with small leuk esterase, 11-20 white blood cells and urine sent for culture but we will cover with fosfomycin.  Patient able to tolerate p.o. without difficulty.  At this time with symptoms improved she does not meet inpatient criteria for admission and will be discharged with outpatient follow-up.  She states that her baseline blood pressures are in the upper 90s and she is not orthostatic on reevaluation prior to discharge.  Patient given return precaution and discharged with outpatient follow-up.   Additional history obtained:  -External records from outside source obtained and reviewed including: Chart review including previous notes, labs, imaging, consultation notes   Lab Tests: -I ordered, reviewed, and interpreted labs.   The pertinent results include:   Labs Reviewed  LIPASE, BLOOD - Abnormal; Notable for the following components:      Result Value   Lipase 53 (*)    All other components within normal limits  COMPREHENSIVE METABOLIC PANEL - Abnormal; Notable for the following components:   Glucose, Bld 113 (*)    Creatinine, Ser 1.26 (*)    GFR, Estimated 49 (*)    All other components within normal limits  CBC - Abnormal; Notable for the following components:   WBC 3.8 (*)    All other components within normal  limits  RESP PANEL BY RT-PCR (RSV, FLU A&B, COVID)  RVPGX2  URINALYSIS, ROUTINE W REFLEX MICROSCOPIC  LACTIC ACID, PLASMA  I-STAT CHEM 8, ED      EKG   EKG Interpretation Date/Time:  Sunday August 04 2023 15:44:50 EST Ventricular Rate:  77 PR Interval:  132 QRS Duration:  167 QT Interval:  441 QTC Calculation: 500 R Axis:   185  Text Interpretation: ATRIAL PACED RHYTHM Confirmed by Itzae Miralles ([id number]) on 08/04/2023 3:53:03 PM         Imaging Studies ordered: I ordered imaging studies including CT abdomen pelvis I independently visualized and interpreted imaging. I agree with the radiologist interpretation   Medicines ordered and prescription drug management: Meds ordered this encounter  Medications   ondansetron (ZOFRAN-ODT) disintegrating tablet 4 mg   lactated ringers bolus 500 mL   lactated ringers bolus 1,000 mL   iohexol (OMNIPAQUE) 300 MG/ML solution 100 mL   ondansetron (ZOFRAN) injection 4 mg    -I have reviewed the patients home medicines and have made adjustments as needed  Critical interventions Fluid resuscitation    Cardiac Monitoring: The patient was maintained on a cardiac monitor.  I personally viewed and interpreted the cardiac monitored which showed an underlying rhythm of: A paced rhythm  Social Determinants of Health:  Factors impacting patients care include: none   Reevaluation: After the interventions noted above, I reevaluated the patient and found that they have :improved  Co morbidities that complicate the patient evaluation  Past Medical History:  Diagnosis Date   Anemia    Arthritis    CHF (congestive heart failure) (HCC) 12/06/2022   EF is 20% to 25 %   COPD (chronic obstructive pulmonary disease) (HCC)    Diabetes mellitus without complication (HCC)    Eczema    GERD (gastroesophageal reflux disease)    History of adenomatous polyp of colon    08-03-2016  tubular adenoma x2 and hyperplastic   History of chronic  gastritis 08/03/2016   History of ectopic pregnancy 1988   s/p  left salpingectomy   History of endometriosis    History of kidney stones    History of partial nephrectomy    right pelvis for very large stone   History of pneumonia 07/02/2016   CAP   History of sepsis    07-01-2016  sepsis w/ pyelonephritis, CAP /   12-31-2016  urosepsis w/ kidney stone obstruction   History of uterine leiomyoma    Hx of adenomatous colonic polyps 08/03/2016   2 adenomas, 1 hpp and 1 lost polyp 2018 all < 1 cm    Hyperlipidemia    Hypertension    Left ureteral stone    Myocardial infarction [facility])    Nephrolithiasis    left obstructive stone and right non-obstructive stone  per CT 12-31-2016   Urgency of urination       Dispostion: I considered admission for this patient, but at this time she does not meet inpatient criteria for admission and will be discharged with outpatient follow-up     Final Clinical Impression(s) / ED Diagnoses Final diagnoses:  None     @PCDICTATION @    Glendora Score, MD 08/04/23 2300

## 2023-08-04 NOTE — ED Provider Triage Note (Cosign Needed Addendum)
Emergency Medicine Provider Triage Evaluation Note  Lynn Estrada , a 63 y.o. female  was evaluated in triage.  Pt complains of right lower quadrant pain for the past few days with nausea vomiting again this morning.  Patient does endorse having her appendix and having bilious vomit.  Patient denies chest pain shortness of breath or dysuria vaginal bleeding or discharge.  Patient denies fevers.  Patient denies dark stools or red stools or hematemesis.  Review of Systems  Positive:  Negative:   Physical Exam  BP (!) 84/58   Pulse 83   Temp 98.6 F (37 C) (Oral)   Resp 16   Ht 6\' 2"  (1.88 m)   Wt 93 kg   SpO2 100%   BMI 26.32 kg/m  Gen:   Awake, in distress   Resp:  Normal effort  MSK:   Moves extremities without difficulty  Other:  Right lower quadrant rebound tenderness  Medical Decision Making  Medically screening exam initiated at 2:56 PM.  Appropriate orders placed.  Lynn Estrada was informed that the remainder of the evaluation will be completed by another provider, this initial triage assessment does not replace that evaluation, and the importance of remaining in the ED until their evaluation is complete.  Workup initiated, patient was hypotensive so small amount of fluids were ordered last echo did show an LVEF of 20-25%, patient did have right lower quadrant pain and so imaging was ordered along with labs, patient stable at this time.   Netta Corrigan, PA-C 08/04/23 1457    Netta Corrigan, PA-C 08/04/23 [phone]

## 2023-08-04 NOTE — ED Notes (Signed)
Stopped LR due to hx of EF

## 2023-08-05 ENCOUNTER — Encounter: Payer: Self-pay | Admitting: Internal Medicine

## 2023-08-05 ENCOUNTER — Encounter (HOSPITAL_COMMUNITY): Payer: Self-pay | Admitting: Cardiovascular Disease

## 2023-08-05 ENCOUNTER — Ambulatory Visit (HOSPITAL_COMMUNITY): Payer: Medicaid Other | Attending: Cardiovascular Disease

## 2023-08-06 ENCOUNTER — Encounter: Payer: Medicaid Other | Admitting: Occupational Therapy

## 2023-08-06 ENCOUNTER — Ambulatory Visit: Payer: Medicaid Other | Attending: Internal Medicine | Admitting: Physical Therapy

## 2023-08-06 ENCOUNTER — Telehealth (HOSPITAL_COMMUNITY): Payer: Self-pay | Admitting: Pharmacy Technician

## 2023-08-06 ENCOUNTER — Other Ambulatory Visit (HOSPITAL_COMMUNITY): Payer: Self-pay

## 2023-08-06 DIAGNOSIS — M6281 Muscle weakness (generalized): Secondary | ICD-10-CM | POA: Insufficient documentation

## 2023-08-06 DIAGNOSIS — R2681 Unsteadiness on feet: Secondary | ICD-10-CM | POA: Insufficient documentation

## 2023-08-06 DIAGNOSIS — R29818 Other symptoms and signs involving the nervous system: Secondary | ICD-10-CM | POA: Insufficient documentation

## 2023-08-06 LAB — URINE CULTURE: Culture: <10000 — AB

## 2023-08-06 NOTE — Telephone Encounter (Signed)
Advanced Heart Failure Patient Advocate Encounter  Prior Authorization Appeal for Bisoprolol has been approved.    Effective through 07/28/24  Archer Asa, CPhT

## 2023-08-06 NOTE — Telephone Encounter (Signed)
 Advanced Heart Failure Patient Product/process Development Scientist completed.   The patient is insured through  [employer] (Medicaid)    Ran test claim for Bisoprolol  5mg . Currently a quantity of 45 is a 90 day supply and the co-pay is $4. Called and updated patient.   This test claim was processed through [facility]- copay amounts may vary at other pharmacies due to pharmacy/plan contracts, or as the patient moves through the different stages of their insurance plan.   Almarie JULIANNA Pa, CPhT

## 2023-08-12 ENCOUNTER — Ambulatory Visit: Payer: Medicaid Other | Admitting: Physical Therapy

## 2023-08-12 ENCOUNTER — Encounter: Payer: Self-pay | Admitting: Physical Therapy

## 2023-08-12 VITALS — BP 114/71 | HR 71

## 2023-08-12 DIAGNOSIS — M6281 Muscle weakness (generalized): Secondary | ICD-10-CM | POA: Diagnosis present

## 2023-08-12 DIAGNOSIS — R2681 Unsteadiness on feet: Secondary | ICD-10-CM

## 2023-08-12 DIAGNOSIS — R29818 Other symptoms and signs involving the nervous system: Secondary | ICD-10-CM

## 2023-08-12 NOTE — Therapy (Signed)
 OUTPATIENT PHYSICAL THERAPY NEURO TREATMENT/DISCHARGE SUMMARY   Patient Name: Lynn Estrada MRN: [id number] DOB:[birth year], 63 y.o., female Today's Date: 08/12/2023   PCP:  Aldine Humphreys, PA   REFERRING PROVIDER: Lasandra Points, MD  END OF SESSION:  PT End of Session - 08/12/23 1051     Visit Number 4    Number of Visits 7    Date for PT Re-Evaluation 09/06/23    Authorization Type [place] MEDICAID [employer]    PT Start Time 1050    PT Stop Time 1120   full time not used to D/C visit   PT Time Calculation (min) 30 min    Equipment Utilized During Treatment Gait belt    Activity Tolerance Patient tolerated treatment well    Behavior During Therapy WFL for tasks assessed/performed             Past Medical History:  Diagnosis Date   Anemia    Arthritis    CHF (congestive heart failure) (HCC) 12/06/2022   EF is 20% to 25 %   COPD (chronic obstructive pulmonary disease) (HCC)    Diabetes mellitus without complication (HCC)    Eczema    GERD (gastroesophageal reflux disease)    History of adenomatous polyp of colon    08-03-2016  tubular adenoma x2 and hyperplastic   History of chronic gastritis 08/03/2016   History of ectopic pregnancy 1988   s/p  left salpingectomy   History of endometriosis    History of kidney stones    History of partial nephrectomy    right pelvis for very large stone   History of pneumonia 07/02/2016   CAP   History of sepsis    07-01-2016  sepsis w/ pyelonephritis, CAP /   12-31-2016  urosepsis w/ kidney stone obstruction   History of uterine leiomyoma    Hx of adenomatous colonic polyps 08/03/2016   2 adenomas, 1 hpp and 1 lost polyp 2018 all < 1 cm    Hyperlipidemia    Hypertension    Left ureteral stone    Myocardial infarction [facility])    Nephrolithiasis    left obstructive stone and right non-obstructive stone  per CT 12-31-2016   Urgency of urination    Past Surgical History:  Procedure Laterality Date   ABDOMINAL  HYSTERECTOMY  01/20/2007   w/ Lysis Adhesions/  Left salpingoophorectomy/  Right Salpingectomy   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   Procedure: BIV ICD INSERTION CRT-D;  Surgeon: Efraim Grange, MD;  Location: [facility];  Service: Cardiovascular;  Laterality: N/A;   CHOLECYSTECTOMY  1994   and Right Partial Nephrectomy (pelvis for very large stone)   COLONOSCOPY WITH PROPOFOL  N/A 03/26/2023   Procedure: COLONOSCOPY WITH PROPOFOL ;  Surgeon: Kenney Peacemaker, MD;  Location: [facility];  Service: Gastroenterology;  Laterality: N/A;   CYSTOSCOPY W/ URETERAL STENT PLACEMENT Left 12/31/2016   Procedure: CYSTOSCOPY WITH RETROGRADE PYELOGRAM/ LEFT URETERAL STENT PLACEMENT;  Surgeon: Osborn Blaze, MD;  Location: [facility];  Service: Urology;  Laterality: Left;   CYSTOSCOPY WITH RETROGRADE PYELOGRAM, URETEROSCOPY AND STENT PLACEMENT Left 01/18/2017   Procedure: 1ST STAGE CYSTOSCOPY WITH RETROGRADE PYELOGRAM, URETEROSCOPY AND STENT REPLACEMENT;  Surgeon: Osborn Blaze, MD;  Location: [facility];  Service: Urology;  Laterality: Left;   CYSTOSCOPY WITH RETROGRADE PYELOGRAM, URETEROSCOPY AND STENT PLACEMENT Left 02/01/2017   Procedure: 2ND STAGE CYSTOSCOPY WITH RETROGRADE PYELOGRAM, URETEROSCOPY AND STENT REPLACEMENT;  Surgeon: Osborn Blaze, MD;  Location: [facility];  Service:  Urology;  Laterality: Left;   ECTOPIC PREGNANCY SURGERY  1988   Left Salpingectomy   HOLMIUM LASER APPLICATION Left 01/18/2017   Procedure: HOLMIUM LASER APPLICATION;  Surgeon: Osborn Blaze, MD;  Location: [facility];  Service: Urology;  Laterality: Left;   HOLMIUM LASER APPLICATION Left 02/01/2017   Procedure: HOLMIUM LASER APPLICATION;  Surgeon: Osborn Blaze, MD;  Location: [facility];  Service: Urology;  Laterality: Left;   LUMBAR DISC SURGERY  01/1999   right L5 -- S1   POLYPECTOMY  03/26/2023   Procedure: POLYPECTOMY;  Surgeon: Kenney Peacemaker,  MD;  Location: [facility];  Service: Gastroenterology;;   RE-EXPLORATION LUMBAR/  LAMINECTOMY AND MICRODISKECTOMY  10/14/2001   right L5 -- S1   RIGHT HEART CATH N/A 05/23/2022   Procedure: RIGHT HEART CATH;  Surgeon: Mardell Shade, MD;  Location: [facility];  Service: Cardiovascular;  Laterality: N/A;   RIGHT/LEFT HEART CATH AND CORONARY ANGIOGRAPHY N/A 07/18/2021   Procedure: RIGHT/LEFT HEART CATH AND CORONARY ANGIOGRAPHY;  Surgeon: Mardell Shade, MD;  Location: [facility];  Service: Cardiovascular;  Laterality: N/A;   TUBAL LIGATION Bilateral 10/17/1999   PPTL   UMBILICAL HERNIA REPAIR  age 47   Patient Active Problem List   Diagnosis Date Noted   Cerebrovascular accident (CVA) (HCC) 05/26/2023   Pyuria 05/25/2023   Left-sided weakness 05/24/2023   Benign neoplasm of cecum 03/26/2023   Benign neoplasm of descending colon 03/26/2023   Benign neoplasm of sigmoid colon 03/26/2023   Adhesive capsulitis of right shoulder 08/24/2022   Mass of right hand 02/22/2022   LBBB (left bundle branch block) 02/01/2022   Type 2 diabetes mellitus with hyperglycemia, without long-term current use of insulin  (HCC) 01/22/2022   Hypertriglyceridemia 01/21/2022   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 10/26/2021   Chronic combined systolic and diastolic heart failure (HCC) 07/27/2021   Acute combined systolic (congestive) and diastolic (congestive) heart failure (HCC)    Hypocalcemia 07/12/2021   Elevated troponin 07/12/2021   Lipoma of hand 07/20/2020   Osteoarthritis of carpometacarpal (CMC) joint of thumb 07/20/2020   Pain in right hand 07/20/2020   Radial styloid tenosynovitis 07/20/2020   Other osteoarthritis of spine 10/23/2019   Suspected COVID-19 virus infection 06/11/2019   Acute respiratory failure with hypoxia (HCC) 06/11/2019   Multifocal pneumonia 06/11/2019   Carpal tunnel syndrome, bilateral 04/24/2019   Ureteral stone with  hydronephrosis 12/31/2016   Hx of adenomatous colonic polyps 08/03/2016   Normocytic anemia 07/10/2016   Essential hypertension 07/26/2015   Eczema 07/26/2015   Overweight (BMI 25.0-29.9) 07/26/2015    ONSET DATE: 05/27/2023  REFERRING DIAG: I63.9 (ICD-10-CM) - Cerebrovascular accident (CVA), unspecified mechanism (HCC)  THERAPY DIAG:  Other symptoms and signs involving the nervous system  Muscle weakness (generalized)  Unsteadiness on feet  Muscle weakness  Rationale for Evaluation and Treatment: Rehabilitation  SUBJECTIVE:  SUBJECTIVE STATEMENT:  GOES BY LYNN  Reports was sick last week and had to miss appt. Feeling much today now, just has a lingering cough. Notes today is her last PT visit and wants to graduate today. Before she got sick, was feeling really good. Wants to do the bike again today. Pt reports that she is not up for the today, would rather use the bike.   Pt accompanied by: self  PERTINENT HISTORY: Pt admitted 05/24/23 with Lt sided weakness and slurred speech which resolved. 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 PMH: HTN, gastritis, GERD, CHF, COPD, DM, CAD, HLD   Heart failure w/defibrillator   PAIN:  Are you having pain? No  Vitals:   08/12/23 1057  BP: 114/71  Pulse: 71     PRECAUTIONS: ICD/Pacemaker  RED FLAGS: None   WEIGHT BEARING RESTRICTIONS: No  FALLS: Has patient fallen in last 6 months? No  LIVING ENVIRONMENT: Lives with: lives with their spouse and and her daughter  Lives in: House/apartment Stairs: No - 1 story ranch style  Has following equipment at home: Single point cane and Environmental consultant - 2 wheeled  PLOF: Independent and Vocation/Vocational requirements: Works part time  as an Ship broker at [employer]   PATIENT GOALS: Wants to strengthen her mobility in her arms and her legs   OBJECTIVE:  Note: Objective measures were completed at Evaluation unless otherwise noted.  DIAGNOSTIC FINDINGS: MRI brain 05/24/23: IMPRESSION: 1. Small acute infarct in the cortex of the posterior right cingulate gyrus (right ACA territory). 2. Tiny remote infarct in the right cerebellar hemisphere. 3. Scattered foci of T2 hyperintensity within the white matter of the cerebral hemispheres, nonspecific but may represent moderate chronic microvascular ischemic changes.    COGNITION: Overall cognitive status: Within functional limits for tasks assessed   SENSATION: WFL  COORDINATION: Heel to shin: slower and more difficult to perform with LLE     LOWER EXTREMITY MMT:    MMT Right Eval Left Eval  Hip flexion 5 3-  Hip extension    Hip abduction 4+ 4  Hip adduction 4+ 4  Hip internal rotation    Hip external rotation    Knee flexion 5 4  Knee extension 5 4  Ankle dorsiflexion 5 5  Ankle plantarflexion    Ankle inversion    Ankle eversion    (Blank rows = not tested)  BED MOBILITY:  Pt reports no difficulties   TRANSFERS: Assistive device utilized: None  Sit to stand: SBA Stand to sit: SBA Needs to use BUE support to stand from chair   STAIRS: Level of Assistance: SBA Stair Negotiation Technique: Alternating Pattern  with Bilateral Rails Number of Stairs: 4  Height of Stairs: 6"   GAIT: Gait pattern: step through pattern and decreased stride length Distance walked: Clinic distances  Assistive device utilized: None Level of assistance: SBA Comments: Pt reports having to use a cane out in the community for longer distances, pt not using a cane today.   FUNCTIONAL TESTS:  5 times sit to stand: 19.1 seconds with BUE support  10 meter walk test: 9.1 seconds = 3.6 ft/sec    TODAY'S TREATMENT:  Therapeutic Activity:  5x sit <> stand: 14.6 seconds with BUE support, 14.8 seconds without UE support   [facility] PT Assessment - 08/12/23 1059       Functional Gait  Assessment   Gait assessed  Yes    Gait Level Surface Walks 20 ft in less than 5.5 sec, no assistive devices, good speed, no evidence for imbalance, normal gait pattern, deviates no more than 6 in outside of the 12 in walkway width.   5.1   Change in Gait Speed Able to smoothly change walking speed without loss of balance or gait deviation. Deviate no more than 6 in outside of the 12 in walkway width.    Gait with Horizontal Head Turns Performs head turns smoothly with no change in gait. Deviates no more than 6 in outside 12 in walkway width    Gait with Vertical Head Turns Performs head turns with no change in gait. Deviates no more than 6 in outside 12 in walkway width.    Gait and Pivot Turn Pivot turns safely within 3 sec and stops quickly with no loss of balance.    Step Over Obstacle Is able to step over 2 stacked shoe boxes taped together (9 in total height) without changing gait speed. No evidence of imbalance.    Gait with Narrow Base of Support Is able to ambulate for 10 steps heel to toe with no staggering.    Gait with Eyes Closed Walks 20 ft, uses assistive device, slower speed, mild gait deviations, deviates 6-10 in outside 12 in walkway width. Ambulates 20 ft in less than 9 sec but greater than 7 sec.   7.3   Ambulating Backwards Walks 20 ft, no assistive devices, good speed, no evidence for imbalance, normal gait    Steps Alternating feet, no rail.    Total Score 29    FGA comment: 29/30               Therapeutic Exercise:  NuStep with BUE/BLE for 9 minutes at Gear 4.0 for strengthening, ROM, activity tolerance. Pt reporting RPE as 7/10     PATIENT EDUCATION: Education details:Continue HEP, Trying to the SciFit at the gym to work on  strengthening and endurance - starting at 10-12 minutes and gradually incr the time. And working on walking program, results of goals, D/C from PT  Person educated: Patient Education method: Explanation, Demonstration, and Verbal cues Education comprehension: verbalized understanding, returned demonstration, and needs further education  HOME EXERCISE PROGRAM: SciFit at the gym/walking program   Access Code: [email or web address] URL: [email or web address] Date: 07/15/2023 Prepared by: Jonathan Neighbor  Exercises - Walking March  - 1 x daily - 5 x weekly - 3 sets - Standing Single Leg Stance with Counter Support  - 1-2 x daily - 5 x weekly - 3 sets - 15 hold - Standing Hip Abduction with Resistance at Ankles and Counter Support  - 1 x daily - 5 x weekly - 2 sets - 10 reps - Standing Hip Extension with Resistance at Ankles and Counter Support  - 1 x daily - 5 x weekly - 2 sets - 10 reps  PHYSICAL THERAPY DISCHARGE SUMMARY  Visits from Start of Care: 4  Current functional level related to goals / functional outcomes: See LTGs/Clinical Assessment Statement   Remaining deficits: Decr endurance/functional strength   Education / Equipment: HEP - walking program/SciFit for endurance    Patient agrees to discharge. Patient goals were met. Patient is being discharged due to meeting the  stated rehab goals. And pt's request.    GOALS: Goals reviewed with patient? Yes  SHORT TERM GOALS: ALL STGS = LTGS  LONG TERM GOALS: Target date: 08/05/2023  Pt will be independent with final HEP for strength/balance in order to build upon functional gains made in therapy. Baseline: pt reports exercises are going well  Goal status: MET  2.  Pt with improve distance to at least 1320' with 3/10 RPE afterwards in order to demo improved endurance.  Baseline: 1260' with 5/10 RPE   Pt was sick last week and does not want to do today, would rather do NuStep  Goal status: DEFERRED  3.  Pt  will improve FGA to at least a 27/30 in order to demo decr fall risk.  Baseline: 24/30  29/30 (2/10) Goal status: MET  4.  Pt will improve 5x sit<>stand to less than or equal to 16.5 sec to demonstrate improved functional strength and transfer efficiency.   Baseline: 19.1 seconds with BUE support   14.8 seconds with no UE support on 2/10 Goal status: MET    ASSESSMENT:  CLINICAL IMPRESSION: Today is pt's last scheduled day of PT - pt requesting to D/C at this time. Pt had to miss last week's appt due to being sick. Began to check pt's LTGs with pt meeting all LTGs (with exception of , but was deferred due to pt not feeling up to performing today). Pt improved FGA from a 24/30 to a 29/30, indicating no fall risk. Pt with significant improvement of 5x sit <> stand to 14.8 seconds with no UE support, indicating improvements in functional strength. Pt to continue to performing HEP and going to the gym after D/C to continue to build up functional strength/endurance.    OBJECTIVE IMPAIRMENTS: Abnormal gait, decreased activity tolerance, decreased balance, decreased coordination, decreased mobility, difficulty walking, decreased strength, and pain.   ACTIVITY LIMITATIONS: lifting, stairs, transfers, and locomotion level  PARTICIPATION LIMITATIONS: community activity  PERSONAL FACTORS: Age, Behavior pattern, Past/current experiences, Time since onset of injury/illness/exacerbation, and 3+ comorbidities: HTN, gastritis, GERD, CHF, COPD, DM, CAD, HLD   are also affecting patient's functional outcome.   REHAB POTENTIAL: Good  CLINICAL DECISION MAKING: Stable/uncomplicated  EVALUATION COMPLEXITY: Low  PLAN:  PT FREQUENCY: 1x/week  PT DURATION: 8 weeks  PLANNED INTERVENTIONS: 97164- PT Re-evaluation, 97110-Therapeutic exercises, 97530- Therapeutic activity, 97112- Neuromuscular re-education, 97535- Self Care, 09811- Manual therapy, [phone]- Gait training, Patient/Family education,  Balance training, Stair training, Vestibular training, and DME instructions  PLAN FOR NEXT SESSION: D/C    Seabron Cypress, PT, DPT 08/12/2023, 11:30 AM    Check all possible CPT codes: 97164 - PT Re-evaluation, 97110- Therapeutic Exercise, [phone]- Neuro Re-education, [phone] - Gait Training, [phone] - Manual Therapy, [phone] - Therapeutic Activities, and [phone] - Self Care    Check all conditions that are expected to impact treatment: Diabetes mellitus and Neurological condition and/or seizures   If treatment provided at initial evaluation, no treatment charged due to lack of authorization.

## 2023-08-23 ENCOUNTER — Telehealth: Payer: Medicaid Other

## 2023-08-23 ENCOUNTER — Encounter (HOSPITAL_COMMUNITY): Payer: Self-pay | Admitting: Internal Medicine

## 2023-08-23 ENCOUNTER — Inpatient Hospital Stay (HOSPITAL_COMMUNITY)
Admission: EM | Admit: 2023-08-23 | Discharge: 2023-08-27 | DRG: 193 | Disposition: A | Discharge status: Home / Self Care | Payer: Medicaid Other | Attending: Internal Medicine | Admitting: Internal Medicine

## 2023-08-23 ENCOUNTER — Emergency Department (HOSPITAL_COMMUNITY): Payer: Medicaid Other

## 2023-08-23 ENCOUNTER — Other Ambulatory Visit: Payer: Self-pay

## 2023-08-23 DIAGNOSIS — I428 Other cardiomyopathies: Secondary | ICD-10-CM | POA: Diagnosis present

## 2023-08-23 DIAGNOSIS — Z6827 Body mass index (BMI) 27.0-27.9, adult: Secondary | ICD-10-CM

## 2023-08-23 DIAGNOSIS — Z9049 Acquired absence of other specified parts of digestive tract: Secondary | ICD-10-CM

## 2023-08-23 DIAGNOSIS — Z882 Allergy status to sulfonamides status: Secondary | ICD-10-CM

## 2023-08-23 DIAGNOSIS — I110 Hypertensive heart disease with heart failure: Principal | ICD-10-CM | POA: Diagnosis present

## 2023-08-23 DIAGNOSIS — Z7982 Long term (current) use of aspirin: Secondary | ICD-10-CM

## 2023-08-23 DIAGNOSIS — Z860101 Personal history of adenomatous and serrated colon polyps: Secondary | ICD-10-CM

## 2023-08-23 DIAGNOSIS — Z888 Allergy status to other drugs, medicaments and biological substances status: Secondary | ICD-10-CM

## 2023-08-23 DIAGNOSIS — Z8042 Family history of malignant neoplasm of prostate: Secondary | ICD-10-CM

## 2023-08-23 DIAGNOSIS — I5043 Acute on chronic combined systolic (congestive) and diastolic (congestive) heart failure: Secondary | ICD-10-CM | POA: Diagnosis not present

## 2023-08-23 DIAGNOSIS — I252 Old myocardial infarction: Secondary | ICD-10-CM

## 2023-08-23 DIAGNOSIS — Z1152 Encounter for screening for COVID-19: Secondary | ICD-10-CM

## 2023-08-23 DIAGNOSIS — R042 Hemoptysis: Secondary | ICD-10-CM | POA: Diagnosis present

## 2023-08-23 DIAGNOSIS — Z7902 Long term (current) use of antithrombotics/antiplatelets: Secondary | ICD-10-CM

## 2023-08-23 DIAGNOSIS — N390 Urinary tract infection, site not specified: Secondary | ICD-10-CM | POA: Diagnosis present

## 2023-08-23 DIAGNOSIS — E1165 Type 2 diabetes mellitus with hyperglycemia: Secondary | ICD-10-CM | POA: Diagnosis present

## 2023-08-23 DIAGNOSIS — Z95810 Presence of automatic (implantable) cardiac defibrillator: Secondary | ICD-10-CM

## 2023-08-23 DIAGNOSIS — Z8269 Family history of other diseases of the musculoskeletal system and connective tissue: Secondary | ICD-10-CM

## 2023-08-23 DIAGNOSIS — K219 Gastro-esophageal reflux disease without esophagitis: Secondary | ICD-10-CM | POA: Diagnosis present

## 2023-08-23 DIAGNOSIS — J440 Chronic obstructive pulmonary disease with acute lower respiratory infection: Secondary | ICD-10-CM | POA: Diagnosis present

## 2023-08-23 DIAGNOSIS — I10 Essential (primary) hypertension: Secondary | ICD-10-CM | POA: Diagnosis present

## 2023-08-23 DIAGNOSIS — E785 Hyperlipidemia, unspecified: Secondary | ICD-10-CM | POA: Diagnosis present

## 2023-08-23 DIAGNOSIS — Z79899 Other long term (current) drug therapy: Secondary | ICD-10-CM

## 2023-08-23 DIAGNOSIS — R3589 Other polyuria: Secondary | ICD-10-CM | POA: Diagnosis present

## 2023-08-23 DIAGNOSIS — Z905 Acquired absence of kidney: Secondary | ICD-10-CM

## 2023-08-23 DIAGNOSIS — E781 Pure hyperglyceridemia: Secondary | ICD-10-CM | POA: Diagnosis present

## 2023-08-23 DIAGNOSIS — Z833 Family history of diabetes mellitus: Secondary | ICD-10-CM

## 2023-08-23 DIAGNOSIS — N3000 Acute cystitis without hematuria: Secondary | ICD-10-CM

## 2023-08-23 DIAGNOSIS — J189 Pneumonia, unspecified organism: Principal | ICD-10-CM | POA: Diagnosis present

## 2023-08-23 DIAGNOSIS — Z8673 Personal history of transient ischemic attack (TIA), and cerebral infarction without residual deficits: Secondary | ICD-10-CM

## 2023-08-23 DIAGNOSIS — Z87442 Personal history of urinary calculi: Secondary | ICD-10-CM

## 2023-08-23 DIAGNOSIS — Z8249 Family history of ischemic heart disease and other diseases of the circulatory system: Secondary | ICD-10-CM

## 2023-08-23 DIAGNOSIS — Z881 Allergy status to other antibiotic agents status: Secondary | ICD-10-CM

## 2023-08-23 DIAGNOSIS — I447 Left bundle-branch block, unspecified: Secondary | ICD-10-CM | POA: Diagnosis present

## 2023-08-23 DIAGNOSIS — Z87891 Personal history of nicotine dependence: Secondary | ICD-10-CM

## 2023-08-23 DIAGNOSIS — Z91040 Latex allergy status: Secondary | ICD-10-CM

## 2023-08-23 DIAGNOSIS — I509 Heart failure, unspecified: Secondary | ICD-10-CM

## 2023-08-23 DIAGNOSIS — Z7984 Long term (current) use of oral hypoglycemic drugs: Secondary | ICD-10-CM

## 2023-08-23 DIAGNOSIS — D649 Anemia, unspecified: Secondary | ICD-10-CM | POA: Diagnosis present

## 2023-08-23 DIAGNOSIS — E663 Overweight: Secondary | ICD-10-CM | POA: Diagnosis present

## 2023-08-23 DIAGNOSIS — L309 Dermatitis, unspecified: Secondary | ICD-10-CM | POA: Diagnosis present

## 2023-08-23 LAB — COMPREHENSIVE METABOLIC PANEL
ALT: 19 U/L (ref 0–44)
AST: 18 U/L (ref 15–41)
Albumin: 4 g/dL (ref 3.5–5.0)
Alkaline Phosphatase: 44 U/L (ref 38–126)
Anion gap: 10 (ref 5–15)
BUN: 14 mg/dL (ref 8–23)
CO2: 26 mmol/L (ref 22–32)
Calcium: 9 mg/dL (ref 8.9–10.3)
Chloride: 103 mmol/L (ref 98–111)
Creatinine, Ser: 0.97 mg/dL (ref 0.44–1.00)
GFR, Estimated: >60 mL/min (ref >60)
Glucose, Bld: 106 mg/dL — ABNORMAL HIGH (ref 70–99)
Potassium: 3.6 mmol/L (ref 3.5–5.1)
Sodium: 139 mmol/L (ref 135–145)
Total Bilirubin: 1.1 mg/dL (ref 0.0–1.2)
Total Protein: 7.6 g/dL (ref 6.5–8.1)

## 2023-08-23 LAB — URINALYSIS, ROUTINE W REFLEX MICROSCOPIC
Bilirubin Urine: NEGATIVE
Glucose, UA: NEGATIVE mg/dL
Hgb urine dipstick: NEGATIVE
Ketones, ur: NEGATIVE mg/dL
Nitrite: NEGATIVE
Protein, ur: NEGATIVE mg/dL
Specific Gravity, Urine: 1.015 (ref 1.005–1.030)
pH: 7 (ref 5.0–8.0)

## 2023-08-23 LAB — RESP PANEL BY RT-PCR (RSV, FLU A&B, COVID)  RVPGX2
Influenza A by PCR: NEGATIVE
Influenza B by PCR: NEGATIVE
Resp Syncytial Virus by PCR: NEGATIVE
SARS Coronavirus 2 by RT PCR: NEGATIVE

## 2023-08-23 LAB — CBC
HCT: 34 % — ABNORMAL LOW (ref 36.0–46.0)
Hemoglobin: 10.6 g/dL — ABNORMAL LOW (ref 12.0–15.0)
MCH: 28.3 pg (ref 26.0–34.0)
MCHC: 31.2 g/dL (ref 30.0–36.0)
MCV: 90.7 fL (ref 80.0–100.0)
Platelets: 217 10*3/uL (ref 150–400)
RBC: 3.75 MIL/uL — ABNORMAL LOW (ref 3.87–5.11)
RDW: 13.8 % (ref 11.5–15.5)
WBC: 7.8 10*3/uL (ref 4.0–10.5)
nRBC: 0 % (ref 0.0–0.2)

## 2023-08-23 LAB — GLUCOSE, CAPILLARY
Glucose-Capillary: 93 mg/dL (ref 70–99)
Glucose-Capillary: 101 mg/dL — ABNORMAL HIGH (ref 70–99)

## 2023-08-23 LAB — CBG MONITORING, ED: Glucose-Capillary: 115 mg/dL — ABNORMAL HIGH (ref 70–99)

## 2023-08-23 LAB — PROCALCITONIN: Procalcitonin: <0.1 ng/mL

## 2023-08-23 LAB — BRAIN NATRIURETIC PEPTIDE: B Natriuretic Peptide: 1039.6 pg/mL — ABNORMAL HIGH (ref 0.0–100.0)

## 2023-08-23 LAB — MAGNESIUM: Magnesium: 1.9 mg/dL (ref 1.7–2.4)

## 2023-08-23 LAB — PHOSPHORUS: Phosphorus: 3.6 mg/dL (ref 2.5–4.6)

## 2023-08-23 LAB — DIGOXIN LEVEL: Digoxin Level: 0.6 ng/mL — ABNORMAL LOW (ref 0.8–2.0)

## 2023-08-23 MED ORDER — FUROSEMIDE 10 MG/ML IJ SOLN
40.0000 mg | Freq: Two times a day (BID) | INTRAMUSCULAR | Status: DC
  Administered 2023-08-24 (×2): 40 mg via INTRAVENOUS
  Filled 2023-08-23 (×3): qty 4

## 2023-08-23 MED ORDER — SODIUM CHLORIDE 0.9 % IV SOLN
2.0000 g | INTRAVENOUS | Status: DC
  Administered 2023-08-24 – 2023-08-26 (×3): 2 g via INTRAVENOUS
  Filled 2023-08-23 (×3): qty 20

## 2023-08-23 MED ORDER — BUDESONIDE 0.5 MG/2ML IN SUSP
0.5000 mg | Freq: Two times a day (BID) | RESPIRATORY_TRACT | Status: DC
  Administered 2023-08-24 – 2023-08-27 (×7): 0.5 mg via RESPIRATORY_TRACT
  Filled 2023-08-23 (×7): qty 2

## 2023-08-23 MED ORDER — ALBUTEROL SULFATE (2.5 MG/3ML) 0.083% IN NEBU: 2.5000 mg | INHALATION_SOLUTION | RESPIRATORY_TRACT | Status: DC | PRN

## 2023-08-23 MED ORDER — ONDANSETRON HCL 4 MG/2ML IJ SOLN
4.0000 mg | Freq: Once | INTRAMUSCULAR | Status: AC
  Administered 2023-08-23: 4 mg via INTRAVENOUS
  Filled 2023-08-23: qty 2

## 2023-08-23 MED ORDER — ONDANSETRON HCL 4 MG PO TABS: 4.0000 mg | ORAL_TABLET | Freq: Four times a day (QID) | ORAL | Status: DC | PRN

## 2023-08-23 MED ORDER — SODIUM CHLORIDE 0.9 % IV SOLN
1.0000 g | Freq: Once | INTRAVENOUS | Status: AC
  Administered 2023-08-23: 1 g via INTRAVENOUS
  Filled 2023-08-23: qty 10

## 2023-08-23 MED ORDER — INSULIN ASPART 100 UNIT/ML IJ SOLN
0.0000 [IU] | Freq: Three times a day (TID) | INTRAMUSCULAR | Status: DC
  Administered 2023-08-25: 2 [IU] via SUBCUTANEOUS

## 2023-08-23 MED ORDER — SACUBITRIL-VALSARTAN 24-26 MG PO TABS
1.0000 | ORAL_TABLET | Freq: Two times a day (BID) | ORAL | Status: DC
  Administered 2023-08-23 – 2023-08-24 (×3): 1 via ORAL
  Filled 2023-08-23 (×4): qty 1

## 2023-08-23 MED ORDER — POTASSIUM CHLORIDE CRYS ER 20 MEQ PO TBCR
40.0000 meq | EXTENDED_RELEASE_TABLET | Freq: Every day | ORAL | Status: DC
Start: 2023-08-23 — End: 2023-08-26
  Administered 2023-08-23 – 2023-08-26 (×4): 40 meq via ORAL
  Filled 2023-08-23 (×4): qty 2

## 2023-08-23 MED ORDER — EMPAGLIFLOZIN 10 MG PO TABS: 10.0000 mg | ORAL_TABLET | Freq: Every morning | ORAL | Status: DC

## 2023-08-23 MED ORDER — ACETAMINOPHEN 325 MG PO TABS
650.0000 mg | ORAL_TABLET | Freq: Four times a day (QID) | ORAL | Status: DC | PRN
  Administered 2023-08-23 – 2023-08-25 (×6): 650 mg via ORAL
  Filled 2023-08-23 (×6): qty 2

## 2023-08-23 MED ORDER — ENSURE ENLIVE PO LIQD
237.0000 mL | Freq: Two times a day (BID) | ORAL | Status: DC
  Administered 2023-08-24 – 2023-08-27 (×6): 237 mL via ORAL

## 2023-08-23 MED ORDER — ONDANSETRON HCL 4 MG/2ML IJ SOLN
4.0000 mg | Freq: Four times a day (QID) | INTRAMUSCULAR | Status: DC | PRN
  Administered 2023-08-26: 4 mg via INTRAVENOUS
  Filled 2023-08-23: qty 2

## 2023-08-23 MED ORDER — SODIUM CHLORIDE 0.9 % IV SOLN
500.0000 mg | Freq: Once | INTRAVENOUS | Status: AC
  Administered 2023-08-23: 500 mg via INTRAVENOUS
  Filled 2023-08-23: qty 5

## 2023-08-23 MED ORDER — CLOPIDOGREL BISULFATE 75 MG PO TABS
75.0000 mg | ORAL_TABLET | Freq: Every day | ORAL | Status: DC
  Administered 2023-08-24 – 2023-08-27 (×4): 75 mg via ORAL
  Filled 2023-08-23 (×4): qty 1

## 2023-08-23 MED ORDER — FUROSEMIDE 10 MG/ML IJ SOLN
60.0000 mg | INTRAMUSCULAR | Status: AC
  Administered 2023-08-23: 60 mg via INTRAVENOUS
  Filled 2023-08-23: qty 8

## 2023-08-23 MED ORDER — METFORMIN HCL 500 MG PO TABS
500.0000 mg | ORAL_TABLET | Freq: Two times a day (BID) | ORAL | Status: DC
  Administered 2023-08-24 – 2023-08-26 (×5): 500 mg via ORAL
  Filled 2023-08-23 (×5): qty 1

## 2023-08-23 MED ORDER — ACETAMINOPHEN 650 MG RE SUPP: 650.0000 mg | Freq: Four times a day (QID) | RECTAL | Status: DC | PRN

## 2023-08-23 MED ORDER — DIGOXIN 125 MCG PO TABS
125.0000 ug | ORAL_TABLET | Freq: Every day | ORAL | Status: DC
  Administered 2023-08-24 – 2023-08-27 (×4): 125 ug via ORAL
  Filled 2023-08-23 (×4): qty 1

## 2023-08-23 MED ORDER — GUAIFENESIN ER 600 MG PO TB12
600.0000 mg | ORAL_TABLET | Freq: Two times a day (BID) | ORAL | Status: DC
  Administered 2023-08-23 – 2023-08-27 (×8): 600 mg via ORAL
  Filled 2023-08-23 (×8): qty 1

## 2023-08-23 MED ORDER — SODIUM CHLORIDE 0.9 % IV SOLN
500.0000 mg | INTRAVENOUS | Status: DC
  Filled 2023-08-23: qty 5

## 2023-08-23 MED ORDER — BISOPROLOL FUMARATE 5 MG PO TABS
2.5000 mg | ORAL_TABLET | Freq: Every day | ORAL | Status: DC
  Administered 2023-08-24 – 2023-08-26 (×2): 2.5 mg via ORAL
  Filled 2023-08-23 (×3): qty 1

## 2023-08-23 MED ORDER — ATORVASTATIN CALCIUM 10 MG PO TABS
10.0000 mg | ORAL_TABLET | Freq: Every day | ORAL | Status: DC
  Administered 2023-08-24 – 2023-08-27 (×4): 10 mg via ORAL
  Filled 2023-08-23 (×4): qty 1

## 2023-08-23 NOTE — ED Notes (Signed)
 Unable to give urine sample at this time.

## 2023-08-23 NOTE — H&P (Signed)
History and Physical    Patient: Lynn Estrada BMW:[id number] DOB: 05/23/1961 DOA: 08/23/2023 DOS: the patient was seen and examined on 08/23/2023 PCP: Porfirio Oar, PA  Patient coming from: Home  Chief Complaint:  Chief Complaint  Patient presents with   Cough   Fatigue   Headache   Nausea   HPI: Lynn Estrada is a 63 y.o. female with medical history significant of normocytic anemia, eczema, GERD, colon polyps, chronic gastritis, history of ectopic pregnancy with left salpingectomy, endometriosis, sepsis due to UTI in the setting of urolithiasis requiring a right partial nephrectomy due to a large size of urolith, history of community-acquired pneumonia, uterine fibroid, hypertension who presented to the emergency department with complaints of productive cough, pleuritic chest pain, fatigue and headache.  She has been feeling thirsty and has developed polydipsia with polyuria.  She has had fever, chills, wheezing and hemoptysis.  She also had nausea and emesis for the past 2 days.  She has not being able to eat anything in the past 2 days.  No constipation or diarrhea.  No flank pain or hematuria.  Lab work: Urinalysis showed moderate leukocyte esterase, 21-50 WBC and many bacteria.  Urine culture collection is pending.  Coronavirus, influenza and RSV PCR negative.  CBC showed a white count 7.8, hemoglobin 10.6 g/dL and platelets 272.  BNP was 1039.6 pg/mL.  CMP showed a glucose of 106 mg/deciliter, but was otherwise unremarkable.  Imaging: Portable 1 view chest radiograph showing new right infrahilar heterogeneous airspace opacification and probable similar heterogenous airspace opacity overlying the left lower lung/CardioSEAL with.  This is suspicious for at least right lower lobe pneumonia and possible multifocal pneumonia.  Cardiac silhouette congestion with contours are unchanged and within normal limits with moderate calcification within the aortic arch.  No definite pleural  effusion.  No pneumothorax.   ED course: Initial vital signs were temperature 98.8 F, pulse 99, respiration 24, BP 126/87 mmHg O2 sat 98% on room air.  The patient received furosemide 60 mg IVP, azithromycin 500 mg IVPB and ceftriaxone 1 g IVPB.  Review of Systems: As mentioned in the history of present illness. All other systems reviewed and are negative. Past Medical History:  Diagnosis Date   Anemia    Arthritis    CHF (congestive heart failure) (HCC) 12/06/2022   EF is 20% to 25 %   COPD (chronic obstructive pulmonary disease) (HCC)    Diabetes mellitus without complication (HCC)    Eczema    GERD (gastroesophageal reflux disease)    History of adenomatous polyp of colon    08-03-2016  tubular adenoma x2 and hyperplastic   History of chronic gastritis 08/03/2016   History of ectopic pregnancy 1988   s/p  left salpingectomy   History of endometriosis    History of kidney stones    History of partial nephrectomy    right pelvis for very large stone   History of pneumonia 07/02/2016   CAP   History of sepsis    07-01-2016  sepsis w/ pyelonephritis, CAP /   12-31-2016  urosepsis w/ kidney stone obstruction   History of uterine leiomyoma    Hx of adenomatous colonic polyps 08/03/2016   2 adenomas, 1 hpp and 1 lost polyp 2018 all < 1 cm    Hyperlipidemia    Hypertension    Left ureteral stone    Myocardial infarction [facility])    Nephrolithiasis    left obstructive stone and right non-obstructive stone  per CT 12-31-2016  Urgency of urination    Past Surgical History:  Procedure Laterality Date   ABDOMINAL HYSTERECTOMY  01/20/2007   w/ Lysis Adhesions/  Left salpingoophorectomy/  Right Salpingectomy   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   Procedure: BIV ICD INSERTION CRT-D;  Surgeon: Maurice Small, MD;  Location: [facility] INVASIVE CV LAB;  Service: Cardiovascular;  Laterality: N/A;   CHOLECYSTECTOMY  1994   and Right Partial Nephrectomy (pelvis for very large stone)    COLONOSCOPY WITH PROPOFOL N/A 03/26/2023   Procedure: COLONOSCOPY WITH PROPOFOL;  Surgeon: Iva Boop, MD;  Location: [facility] END[facility];  Service: Gastroenterology;  Laterality: N/A;   CYSTOSCOPY W/ URETERAL STENT PLACEMENT Left 12/31/2016   Procedure: CYSTOSCOPY WITH RETROGRADE PYELOGRAM/ LEFT URETERAL STENT PLACEMENT;  Surgeon: Sebastian Ache, MD;  Location: [facility] ORS;  Service: Urology;  Laterality: Left;   CYSTOSCOPY WITH RETROGRADE PYELOGRAM, URETEROSCOPY AND STENT PLACEMENT Left 01/18/2017   Procedure: 1ST STAGE CYSTOSCOPY WITH RETROGRADE PYELOGRAM, URETEROSCOPY AND STENT REPLACEMENT;  Surgeon: Sebastian Ache, MD;  Location: [facility];  Service: Urology;  Laterality: Left;   CYSTOSCOPY WITH RETROGRADE PYELOGRAM, URETEROSCOPY AND STENT PLACEMENT Left 02/01/2017   Procedure: 2ND STAGE CYSTOSCOPY WITH RETROGRADE PYELOGRAM, URETEROSCOPY AND STENT REPLACEMENT;  Surgeon: Sebastian Ache, MD;  Location: [facility];  Service: Urology;  Laterality: Left;   ECTOPIC PREGNANCY SURGERY  1988   Left Salpingectomy   HOLMIUM LASER APPLICATION Left 01/18/2017   Procedure: HOLMIUM LASER APPLICATION;  Surgeon: Sebastian Ache, MD;  Location: [facility];  Service: Urology;  Laterality: Left;   HOLMIUM LASER APPLICATION Left 02/01/2017   Procedure: HOLMIUM LASER APPLICATION;  Surgeon: Sebastian Ache, MD;  Location: [facility];  Service: Urology;  Laterality: Left;   LUMBAR DISC SURGERY  01/1999   right L5 -- S1   POLYPECTOMY  03/26/2023   Procedure: POLYPECTOMY;  Surgeon: Iva Boop, MD;  Location: [facility];  Service: Gastroenterology;;   RE-EXPLORATION LUMBAR/  LAMINECTOMY AND MICRODISKECTOMY  10/14/2001   right L5 -- S1   RIGHT HEART CATH N/A 05/23/2022   Procedure: RIGHT HEART CATH;  Surgeon: Dolores Patty, MD;  Location: [facility] INVASIVE CV LAB;  Service: Cardiovascular;  Laterality: N/A;   RIGHT/LEFT HEART CATH AND CORONARY ANGIOGRAPHY N/A  07/18/2021   Procedure: RIGHT/LEFT HEART CATH AND CORONARY ANGIOGRAPHY;  Surgeon: Dolores Patty, MD;  Location: [facility] INVASIVE CV LAB;  Service: Cardiovascular;  Laterality: N/A;   TUBAL LIGATION Bilateral 10/17/1999   PPTL   UMBILICAL HERNIA REPAIR  age 60   Social History:  reports that she quit smoking about 26 years ago. Her smoking use included cigarettes. She started smoking about 31 years ago. She has a 3.8 pack-year smoking history. She has never used smokeless tobacco. She reports that she does not currently use alcohol. She reports that she does not use drugs.  Allergies  Allergen Reactions   Latex Rash and Hives   Bactrim [Sulfamethoxazole-Trimethoprim] Rash   Carvedilol Itching   Metoprolol Itching and Rash    Family History  Problem Relation Age of Onset   Sarcoidosis Mother    Prostate cancer Father    Hypertension Sister    Eczema Sister    Lupus Sister    Breast cancer Maternal Aunt    Gout Maternal Grandmother    Diabetes Maternal Grandmother        leg amputations   Stomach cancer Neg Hx    Colon cancer Neg Hx    Esophageal cancer  Neg Hx    Rectal cancer Neg Hx     Prior to Admission medications   Medication Sig Start Date End Date Taking? Authorizing Provider  acetaminophen (TYLENOL) 500 MG tablet Take 500-1,000 mg by mouth every 6 (six) hours as needed (for pain.). Patient not taking: Reported on 08/04/2023    [provider]  amoxicillin-clavulanate (AUGMENTIN) 875-125 MG tablet Take 1 tablet by mouth 2 (two) times daily.    [provider]  atorvastatin (LIPITOR) 10 MG tablet Take 10 mg by mouth in the morning. 05/15/21   [provider]  benzonatate (TESSALON) 100 MG capsule Take 100 mg by mouth 3 (three) times daily as needed for cough.    [provider]  bisoprolol (ZEBETA) 5 MG tablet Take 0.5 tablets (2.5 mg total) by mouth daily. Patient taking differently: Take 2.5 mg by mouth in the morning. 07/19/23    Bensimhon, Bevelyn Buckles, MD  calcium carbonate (TUMS - DOSED IN MG ELEMENTAL CALCIUM) 500 MG chewable tablet Chew 1 tablet (200 mg of elemental calcium total) by mouth 2 (two) times daily with a meal. 05/27/23   Tawkaliyar, Roya, DO  clopidogrel (PLAVIX) 75 MG tablet Take 1 tablet (75 mg total) by mouth daily. Patient taking differently: Take 75 mg by mouth in the morning. 06/24/23 06/18/24  Butch Penny, NP  digoxin (LANOXIN) 0.125 MG tablet TAKE 1 TABLET BY MOUTH EVERY DAY Patient taking differently: Take 125 mcg by mouth in the morning. 03/12/23   Bensimhon, Bevelyn Buckles, MD  empagliflozin (JARDIANCE) 10 MG TABS tablet Take 1 tablet (10 mg total) by mouth daily. Patient taking differently: Take 10 mg by mouth in the morning. 06/03/23   Tawkaliyar, Roya, DO  furosemide (LASIX) 40 MG tablet Take 40 mg by mouth in the morning.    [provider]  GUAIFENESIN-CODEINE PO Take 4-5 mLs by mouth in the morning, at noon, and at bedtime.    [provider]  metFORMIN (GLUCOPHAGE) 500 MG tablet Take 1 tablet (500 mg total) by mouth 2 (two) times daily with a meal. 05/27/23 08/04/23  Tawkaliyar, Roya, DO  Multiple Vitamin (MULTIVITAMIN WITH MINERALS) TABS tablet Take 1 tablet by mouth in the morning. One A Day for Women 50+    [provider]  ondansetron (ZOFRAN-ODT) 4 MG disintegrating tablet Take 1 tablet (4 mg total) by mouth every 8 (eight) hours as needed for nausea or vomiting. 08/04/23   Kommor, Wyn Forster, MD  sacubitril-valsartan (ENTRESTO) 24-26 MG TAKE 1 TABLET BY MOUTH TWICE A DAY 06/05/23   Bensimhon, Bevelyn Buckles, MD    Physical Exam: Vitals:   08/23/23 1238 08/23/23 1245  BP: 126/87   Pulse: 99   Resp: (!) 24   Temp: 98.8 F (37.1 C)   TempSrc: Oral   SpO2: 98%   Weight:  93 kg  Height:  6\' 2"  (1.88 m)   Physical Exam Vitals reviewed.  HENT:     Head: Normocephalic.  Abdominal:     General: There is no distension.     Palpations: Abdomen is soft.     Tenderness:  There is no abdominal tenderness.  Skin:    General: Skin is warm and dry.  Neurological:     Mental Status: She is alert.     Data Reviewed:  Results are pending, will review when available. 05/25/2023 transthoracic echocardiogram report. IMPRESSIONS:   1. Left ventricular ejection fraction, by estimation, is 20 to 25%. The  left ventricle has severely decreased function. The  left ventricle  demonstrates global hypokinesis. The left ventricular internal cavity size  was severely dilated. Left ventricular  diastolic parameters are consistent with Grade I diastolic dysfunction  (impaired relaxation). Elevated left ventricular end-diastolic pressure.   2. Right ventricular systolic function is normal. The right ventricular  size is normal. Tricuspid regurgitation signal is inadequate for assessing  PA pressure.   3. The mitral valve is normal in structure. Trivial mitral valve  regurgitation. No evidence of mitral stenosis.   4. The aortic valve is tricuspid. Aortic valve regurgitation is mild. No  aortic stenosis is present.   5. The inferior vena cava is normal in size with greater than 50%  respiratory variability, suggesting right atrial pressure of 3 mmHg.   Comparison(s): No significant change from prior study.   EKG: Vent. rate 108 BPM PR interval 105 ms QRS duration 160 ms QT/QTcB 397/533 ms P-R-T axes 50 -88 91 ATRIAL PACED RHYTHM Probable left atrial enlargement Nonspecific IVCD with LAD Left ventricular hypertrophy Inferior infarct, old Anterior Q waves, possibly due to LVH  Assessment and Plan: Principal Problem:   CAP (community acquired pneumonia) With superimposed:   Acute on chronic combined systolic and diastolic heart failure (HCC) Admit to PCU/inpatient. Continue supplemental oxygen. Scheduled and as needed bronchodilators. Continue ceftriaxone 1 g IVPB daily. Continue azithromycin 500 mg IVPB daily. Check strep pneumoniae urinary  antigen. Check sputum Gram stain, culture and sensitivity. Follow-up blood culture and sensitivity. Follow-up CBC and chemistry in the morning.  Sodium and fluid restriction. Continue furosemide 40 mg IVP twice daily. Monitor daily weights, intake and output. Continue Entresto 24/26 twice daily. Continue bisoprolol 2.5 mg p.o. daily.  Active Problems:   Frequency of urination and polyuria  On ceftriaxone for CAP. Urine culture has been ordered.    Essential hypertension Continue furosemide, beta-blocker and Entresto as above.    Overweight (BMI 25.0-29.9) Current BMI 27.14 kg/m. Lifestyle modifications.    Normocytic anemia Monitor hematocrit and hemoglobin.    Hypertriglyceridemia Continue atorvastatin 10 mg p.o. daily.    Type 2 diabetes mellitus with hyperglycemia, Without long-term current use of insulin (HCC) Carbohydrate modified diet. CBG monitoring with RI SS while in the hospital. Continue metformin 500 mg p.o. twice daily. Hold Jardiance due to frequency and possible UTI.    Advance Care Planning:   Code Status: Full Code   Consults:   Family Communication:   Severity of Illness: The appropriate patient status for this patient is OBSERVATION. Observation status is judged to be reasonable and necessary in order to provide the required intensity of service to ensure the patient's safety. The patient's presenting symptoms, physical exam findings, and initial radiographic and laboratory data in the context of their medical condition is felt to place them at decreased risk for further clinical deterioration. Furthermore, it is anticipated that the patient will be medically stable for discharge from the hospital within 2 midnights of admission.   Author: Bobette Mo, MD 08/23/2023 3:40 PM  For on call review [email or web address].   This document was prepared using Dragon voice recognition software and may contain some unintended transcription errors.

## 2023-08-23 NOTE — ED Triage Notes (Addendum)
Pt arrived via POV. C/o productive cough, fatigue, and headache. Reports excessive thirst and urination.  Aox4  Pt began getting nauseous in triage

## 2023-08-23 NOTE — Plan of Care (Signed)
  Problem: Education: Goal: Knowledge of General Education information will improve Description: Including pain rating scale, medication(s)/side effects and non-pharmacologic comfort measures Outcome: Progressing   Problem: Health Behavior/Discharge Planning: Goal: Ability to manage health-related needs will improve Outcome: Progressing   Problem: Clinical Measurements: Goal: Ability to maintain clinical measurements within normal limits will improve Outcome: Progressing Goal: Will remain free from infection Outcome: Progressing Goal: Diagnostic test results will improve Outcome: Progressing Goal: Respiratory complications will improve Outcome: Progressing Goal: Cardiovascular complication will be avoided Outcome: Progressing   Problem: Activity: Goal: Risk for activity intolerance will decrease Outcome: Progressing   Problem: Nutrition: Goal: Adequate nutrition will be maintained Outcome: Progressing   Problem: Coping: Goal: Level of anxiety will decrease Outcome: Progressing   Problem: Elimination: Goal: Will not experience complications related to bowel motility Outcome: Progressing Goal: Will not experience complications related to urinary retention Outcome: Progressing   Problem: Pain Managment: Goal: General experience of comfort will improve and/or be controlled Outcome: Progressing   Problem: Safety: Goal: Ability to remain free from injury will improve Outcome: Progressing   Problem: Skin Integrity: Goal: Risk for impaired skin integrity will decrease Outcome: Progressing   Problem: Education: Goal: Ability to describe self-care measures that may prevent or decrease complications (Diabetes Survival Skills Education) will improve Outcome: Progressing Goal: Individualized Educational Video(s) Outcome: Progressing   Problem: Coping: Goal: Ability to adjust to condition or change in health will improve Outcome: Progressing   Problem: Fluid  Volume: Goal: Ability to maintain a balanced intake and output will improve Outcome: Progressing   Problem: Health Behavior/Discharge Planning: Goal: Ability to identify and utilize available resources and services will improve Outcome: Progressing Goal: Ability to manage health-related needs will improve Outcome: Progressing   Problem: Metabolic: Goal: Ability to maintain appropriate glucose levels will improve Outcome: Progressing   Problem: Nutritional: Goal: Maintenance of adequate nutrition will improve Outcome: Progressing Goal: Progress toward achieving an optimal weight will improve Outcome: Progressing   Problem: Skin Integrity: Goal: Risk for impaired skin integrity will decrease Outcome: Progressing   Problem: Tissue Perfusion: Goal: Adequacy of tissue perfusion will improve Outcome: Progressing   Problem: Education: Goal: Ability to demonstrate management of disease process will improve Outcome: Progressing Goal: Ability to verbalize understanding of medication therapies will improve Outcome: Progressing Goal: Individualized Educational Video(s) Outcome: Progressing   Problem: Activity: Goal: Capacity to carry out activities will improve Outcome: Progressing   Problem: Cardiac: Goal: Ability to achieve and maintain adequate cardiopulmonary perfusion will improve Outcome: Progressing   Problem: Activity: Goal: Ability to tolerate increased activity will improve Outcome: Progressing   Problem: Clinical Measurements: Goal: Ability to maintain a body temperature in the normal range will improve Outcome: Progressing   Problem: Respiratory: Goal: Ability to maintain adequate ventilation will improve Outcome: Progressing Goal: Ability to maintain a clear airway will improve Outcome: Progressing

## 2023-08-23 NOTE — ED Provider Notes (Signed)
[place] EMERGENCY DEPARTMENT AT [facility] Provider Note   CSN: [id number] Arrival date & time: 08/23/23  1232     History  Chief Complaint  Patient presents with   Cough   Fatigue   Headache   Nausea    Lynn Estrada is a 63 y.o. female with PMHx CHF, chronic gastritis, OA, COPD, DM, kidney stones, HLD, HTN, ACS, CVA who presents to ED concerned for productive cough, fatigue, headaches, nausea x1 month. These symptoms are intermittent. Also endorsing excessive thirst and urination. Also endorses slight subjective fever yesterday. Endorses hemoptyisis around 11AM today which has been slowly resolving. Patient also stating that her nose started bleeding while she was in the waiting room - patient stating that she gets nose bleeds frequently at baseline. Also with intermittent SOB over the past month. Has been taking cough medicine and tylenol with some relief. Also with vomiting today and states that she has not eaten anything in 2 days. Also with diarrhea - last episode 5 days ago.   Denies chest pain,  dysuria, hematuria, hematochezia.      Cough Associated symptoms: headaches   Headache Associated symptoms: cough        Home Medications Prior to Admission medications   Medication Sig Start Date End Date Taking? Authorizing Provider  acetaminophen (TYLENOL) 500 MG tablet Take 500-1,000 mg by mouth every 6 (six) hours as needed (for pain.). Patient not taking: Reported on 08/04/2023    [provider]  amoxicillin-clavulanate (AUGMENTIN) 875-125 MG tablet Take 1 tablet by mouth 2 (two) times daily.    [provider]  atorvastatin (LIPITOR) 10 MG tablet Take 10 mg by mouth in the morning. 05/15/21   [provider]  benzonatate (TESSALON) 100 MG capsule Take 100 mg by mouth 3 (three) times daily as needed for cough.    [provider]  bisoprolol (ZEBETA) 5 MG tablet Take 0.5 tablets (2.5 mg total) by mouth daily. Patient  taking differently: Take 2.5 mg by mouth in the morning. 07/19/23   Bensimhon, Bevelyn Buckles, MD  calcium carbonate (TUMS - DOSED IN MG ELEMENTAL CALCIUM) 500 MG chewable tablet Chew 1 tablet (200 mg of elemental calcium total) by mouth 2 (two) times daily with a meal. 05/27/23   Tawkaliyar, Roya, DO  clopidogrel (PLAVIX) 75 MG tablet Take 1 tablet (75 mg total) by mouth daily. Patient taking differently: Take 75 mg by mouth in the morning. 06/24/23 06/18/24  Butch Penny, NP  digoxin (LANOXIN) 0.125 MG tablet TAKE 1 TABLET BY MOUTH EVERY DAY Patient taking differently: Take 125 mcg by mouth in the morning. 03/12/23   Bensimhon, Bevelyn Buckles, MD  empagliflozin (JARDIANCE) 10 MG TABS tablet Take 1 tablet (10 mg total) by mouth daily. Patient taking differently: Take 10 mg by mouth in the morning. 06/03/23   Tawkaliyar, Roya, DO  furosemide (LASIX) 40 MG tablet Take 40 mg by mouth in the morning.    [provider]  GUAIFENESIN-CODEINE PO Take 4-5 mLs by mouth in the morning, at noon, and at bedtime.    [provider]  metFORMIN (GLUCOPHAGE) 500 MG tablet Take 1 tablet (500 mg total) by mouth 2 (two) times daily with a meal. 05/27/23 08/04/23  Tawkaliyar, Roya, DO  Multiple Vitamin (MULTIVITAMIN WITH MINERALS) TABS tablet Take 1 tablet by mouth in the morning. One A Day for Women 50+    [provider]  ondansetron (ZOFRAN-ODT) 4 MG disintegrating tablet Take 1 tablet (4 mg total)  by mouth every 8 (eight) hours as needed for nausea or vomiting. 08/04/23   Kommor, Wyn Forster, MD  sacubitril-valsartan (ENTRESTO) 24-26 MG TAKE 1 TABLET BY MOUTH TWICE A DAY 06/05/23   Bensimhon, Bevelyn Buckles, MD      Allergies    Latex, Bactrim [sulfamethoxazole-trimethoprim], Carvedilol, and Metoprolol    Review of Systems   Review of Systems  Respiratory:  Positive for cough.   Neurological:  Positive for headaches.    Physical Exam Updated Vital Signs BP 126/87 (BP Location: Left Arm)   Pulse 99    Temp 98.8 F (37.1 C) (Oral)   Resp (!) 24   Ht 6\' 2"  (1.88 m)   Wt 93 kg   SpO2 98%   BMI 26.32 kg/m  Physical Exam Vitals and nursing note reviewed.  Constitutional:      General: She is not in acute distress.    Appearance: She is ill-appearing. She is not toxic-appearing.  HENT:     Head: Normocephalic and atraumatic.     Mouth/Throat:     Mouth: Mucous membranes are moist.     Pharynx: No oropharyngeal exudate or posterior oropharyngeal erythema.  Eyes:     General: No scleral icterus.       Right eye: No discharge.        Left eye: No discharge.     Conjunctiva/sclera: Conjunctivae normal.  Cardiovascular:     Rate and Rhythm: Normal rate and regular rhythm.     Pulses: Normal pulses.     Heart sounds: Normal heart sounds. No murmur heard. Pulmonary:     Effort: Pulmonary effort is normal. No respiratory distress.     Breath sounds: Normal breath sounds. No wheezing, rhonchi or rales.     Comments: Mild tachypnea but no respiratory distress Abdominal:     General: Bowel sounds are normal.     Palpations: Abdomen is soft.     Tenderness: There is no abdominal tenderness.  Musculoskeletal:     Right lower leg: No edema.     Left lower leg: No edema.  Skin:    General: Skin is warm and dry.     Findings: No rash.  Neurological:     General: No focal deficit present.     Mental Status: She is alert and oriented to person, place, and time. Mental status is at baseline.  Psychiatric:        Mood and Affect: Mood normal.     ED Results / Procedures / Treatments   Labs (all labs ordered are listed, but only abnormal results are displayed) Labs Reviewed  CBC - Abnormal; Notable for the following components:      Result Value   RBC 3.75 (*)    Hemoglobin 10.6 (*)    HCT 34.0 (*)    All other components within normal limits  COMPREHENSIVE METABOLIC PANEL - Abnormal; Notable for the following components:   Glucose, Bld 106 (*)    All other components within  normal limits  URINALYSIS, ROUTINE W REFLEX MICROSCOPIC - Abnormal; Notable for the following components:   Leukocytes,Ua MODERATE (*)    Bacteria, UA MANY (*)    All other components within normal limits  BRAIN NATRIURETIC PEPTIDE - Abnormal; Notable for the following components:   B Natriuretic Peptide 1,039.6 (*)    All other components within normal limits  CBG MONITORING, ED - Abnormal; Notable for the following components:   Glucose-Capillary 115 (*)    All other components within normal  limits  RESP PANEL BY RT-PCR (RSV, FLU A&B, COVID)  [id number]  URINE CULTURE    EKG EKG Interpretation Date/Time:  Friday August 23 2023 12:46:51 EST Ventricular Rate:  108 PR Interval:  105 QRS Duration:  160 QT Interval:  397 QTC Calculation: 533 R Axis:   -88  Text Interpretation: ATRIAL PACED RHYTHM Probable left atrial enlargement Nonspecific IVCD with LAD Left ventricular hypertrophy Inferior infarct, old Anterior Q waves, possibly due to LVH No significant change since last tracing Confirmed by Gwyneth Sprout ([id number]) on 08/23/2023 1:57:14 PM  Radiology DG Chest Portable 1 View Result Date: 08/23/2023 CLINICAL DATA:  Productive cough, fatigue, and headache. EXAM: PORTABLE CHEST 1 VIEW COMPARISON:  Chest radiograph 01/14/2023 and 05/14/2022 FINDINGS: Chest wall cardiac AICD is again seen with leads not significantly changed. Cardiac silhouette and mediastinal contours are unchanged and within normal limits with moderate calcification within the aortic arch. New right infrahilar heterogeneous airspace opacification. Probable similar heterogeneous airspace opacity overlying the left lower lungs/cardiac silhouette. No definite pleural effusion. No pneumothorax. Mild dextrocurvature of the midthoracic spine. Moderate multilevel degenerative disc and endplate changes. IMPRESSION: New right infrahilar heterogeneous airspace opacification and probable similar heterogeneous airspace opacity overlying  the left lower lungs/cardiac silhouette. This is suspicious for at least right lower lung pneumonia and possible multifocal pneumonia. Electronically Signed   By: Neita Garnet M.D.   On: 08/23/2023 15:01    Procedures .Critical Care  Performed by: Dorthy Cooler, PA-C Authorized by: Dorthy Cooler, PA-C   Critical care provider statement:    Critical care time (minutes):  30   Critical care was necessary to treat or prevent imminent or life-threatening deterioration of the following conditions: multifocal PNA, CHF, UTI.   Critical care was time spent personally by me on the following activities:  Development of treatment plan with patient or surrogate, discussions with consultants, evaluation of patient's response to treatment, examination of patient, ordering and review of laboratory studies, ordering and review of radiographic studies, ordering and performing treatments and interventions, pulse oximetry, re-evaluation of patient's condition and review of old charts   Care discussed with: admitting provider       Medications Ordered in ED Medications  furosemide (LASIX) injection 60 mg (has no administration in time range)  cefTRIAXone (ROCEPHIN) 1 g in sodium chloride 0.9 % 100 mL IVPB (has no administration in time range)  azithromycin (ZITHROMAX) 500 mg in sodium chloride 0.9 % 250 mL IVPB (has no administration in time range)    ED Course/ Medical Decision Making/ A&P                                 Medical Decision Making Amount and/or Complexity of Data Reviewed Labs: ordered. Radiology: ordered.  Risk Prescription drug management.   This patient presents to the ED for concern of shortness of breath, this involves an extensive number of treatment options, and is a complaint that carries with it a high risk of complications and morbidity.  The differential diagnosis includes Anxiety, Anaphylaxis/Angioedema, Aspirated FB, Arrhythmia, CHF, Asthma, COPD, PNA,  COVID/Flu/RSV, STEMI, Tamponade, TPNX, Sepsis   Co morbidities that complicate the patient evaluation  CHF, chronic gastritis, OA, COPD, DM, kidney stones, HLD, HTN, ACS, CVA   Additional history obtained:  Additional history obtained from 05/2023 ECHO: 20-25% EF   Problem List / ED Course / Critical interventions / Medication management  Admitting patient for CHF, multifocal PNA,  and UTI Patient presents to ED concerned for productive cough, fatigue, SOB headaches, nausea progressing x1 month. Also stating that she vomited today and has not been able to eat for 2 days. Also states that her weight has been fluctuating over the past month.  I Ordered, and personally interpreted labs.  Respiratory panel negative.  CMP unremarkable.  CBC with mild anemia-hemoglobin 10.6.  No leukocytosis.  BNP elevated at 1039.  UA concerning for infection with many bacteria, moderate leukocytes.  The patient was maintained on a cardiac monitor.  I personally viewed and interpreted the cardiac monitored which showed an underlying rhythm of: Sinus rhythm-no acute changes from baseline I ordered imaging studies including chest xray to assess for process contributing to patient's symptoms. I independently visualized and interpreted imaging which showed multifocal pneumonia. I agree with the radiologist interpretation I have reviewed the patients home medicines and have made adjustments as needed Provided patient with IV antibiotics for pneumonia and IV Lasix for CHF.  Patient stating that she has not taken her CHF medicines today. 3:45PM: Dr. Robb Matar admitting provider.   Social Determinants of Health:  none          Final Clinical Impression(s) / ED Diagnoses Final diagnoses:  Pneumonia of both lower lobes due to infectious organism  Acute on chronic congestive heart failure, unspecified heart failure type (HCC)  Acute cystitis without hematuria    Rx / DC Orders ED Discharge Orders     None          Dorthy Cooler, [place] 08/23/23 1547    Gwyneth Sprout, MD 08/23/23 [phone]

## 2023-08-24 DIAGNOSIS — I10 Essential (primary) hypertension: Secondary | ICD-10-CM | POA: Diagnosis not present

## 2023-08-24 DIAGNOSIS — R042 Hemoptysis: Secondary | ICD-10-CM | POA: Diagnosis present

## 2023-08-24 DIAGNOSIS — Z888 Allergy status to other drugs, medicaments and biological substances status: Secondary | ICD-10-CM | POA: Diagnosis not present

## 2023-08-24 DIAGNOSIS — R9431 Abnormal electrocardiogram [ECG] [EKG]: Secondary | ICD-10-CM | POA: Diagnosis not present

## 2023-08-24 DIAGNOSIS — Z87891 Personal history of nicotine dependence: Secondary | ICD-10-CM | POA: Diagnosis not present

## 2023-08-24 DIAGNOSIS — Z87442 Personal history of urinary calculi: Secondary | ICD-10-CM | POA: Diagnosis not present

## 2023-08-24 DIAGNOSIS — E1165 Type 2 diabetes mellitus with hyperglycemia: Secondary | ICD-10-CM | POA: Diagnosis present

## 2023-08-24 DIAGNOSIS — K219 Gastro-esophageal reflux disease without esophagitis: Secondary | ICD-10-CM | POA: Diagnosis present

## 2023-08-24 DIAGNOSIS — I110 Hypertensive heart disease with heart failure: Secondary | ICD-10-CM | POA: Diagnosis present

## 2023-08-24 DIAGNOSIS — J189 Pneumonia, unspecified organism: Secondary | ICD-10-CM | POA: Diagnosis present

## 2023-08-24 DIAGNOSIS — Z8673 Personal history of transient ischemic attack (TIA), and cerebral infarction without residual deficits: Secondary | ICD-10-CM | POA: Diagnosis not present

## 2023-08-24 DIAGNOSIS — I5043 Acute on chronic combined systolic (congestive) and diastolic (congestive) heart failure: Secondary | ICD-10-CM | POA: Diagnosis present

## 2023-08-24 DIAGNOSIS — Z91040 Latex allergy status: Secondary | ICD-10-CM | POA: Diagnosis not present

## 2023-08-24 DIAGNOSIS — N390 Urinary tract infection, site not specified: Secondary | ICD-10-CM | POA: Diagnosis present

## 2023-08-24 DIAGNOSIS — D649 Anemia, unspecified: Secondary | ICD-10-CM | POA: Diagnosis present

## 2023-08-24 DIAGNOSIS — Z6827 Body mass index (BMI) 27.0-27.9, adult: Secondary | ICD-10-CM | POA: Diagnosis not present

## 2023-08-24 DIAGNOSIS — Z1152 Encounter for screening for COVID-19: Secondary | ICD-10-CM | POA: Diagnosis not present

## 2023-08-24 DIAGNOSIS — Z882 Allergy status to sulfonamides status: Secondary | ICD-10-CM | POA: Diagnosis not present

## 2023-08-24 DIAGNOSIS — Z95810 Presence of automatic (implantable) cardiac defibrillator: Secondary | ICD-10-CM | POA: Diagnosis not present

## 2023-08-24 DIAGNOSIS — R0602 Shortness of breath: Secondary | ICD-10-CM | POA: Diagnosis present

## 2023-08-24 DIAGNOSIS — I447 Left bundle-branch block, unspecified: Secondary | ICD-10-CM | POA: Diagnosis present

## 2023-08-24 DIAGNOSIS — I428 Other cardiomyopathies: Secondary | ICD-10-CM | POA: Diagnosis present

## 2023-08-24 DIAGNOSIS — J440 Chronic obstructive pulmonary disease with acute lower respiratory infection: Secondary | ICD-10-CM | POA: Diagnosis present

## 2023-08-24 DIAGNOSIS — Z8042 Family history of malignant neoplasm of prostate: Secondary | ICD-10-CM | POA: Diagnosis not present

## 2023-08-24 DIAGNOSIS — Z860101 Personal history of adenomatous and serrated colon polyps: Secondary | ICD-10-CM | POA: Diagnosis not present

## 2023-08-24 DIAGNOSIS — Z8249 Family history of ischemic heart disease and other diseases of the circulatory system: Secondary | ICD-10-CM | POA: Diagnosis not present

## 2023-08-24 DIAGNOSIS — E663 Overweight: Secondary | ICD-10-CM | POA: Diagnosis present

## 2023-08-24 LAB — CBC
HCT: 32.7 % — ABNORMAL LOW (ref 36.0–46.0)
Hemoglobin: 10.1 g/dL — ABNORMAL LOW (ref 12.0–15.0)
MCH: 28.6 pg (ref 26.0–34.0)
MCHC: 30.9 g/dL (ref 30.0–36.0)
MCV: 92.6 fL (ref 80.0–100.0)
Platelets: 193 10*3/uL (ref 150–400)
RBC: 3.53 MIL/uL — ABNORMAL LOW (ref 3.87–5.11)
RDW: 14.1 % (ref 11.5–15.5)
WBC: 7.1 10*3/uL (ref 4.0–10.5)
nRBC: 0 % (ref 0.0–0.2)

## 2023-08-24 LAB — COMPREHENSIVE METABOLIC PANEL
ALT: 22 U/L (ref 0–44)
AST: 19 U/L (ref 15–41)
Albumin: 3.5 g/dL (ref 3.5–5.0)
Alkaline Phosphatase: 44 U/L (ref 38–126)
Anion gap: 11 (ref 5–15)
BUN: 16 mg/dL (ref 8–23)
CO2: 26 mmol/L (ref 22–32)
Calcium: 8.7 mg/dL — ABNORMAL LOW (ref 8.9–10.3)
Chloride: 102 mmol/L (ref 98–111)
Creatinine, Ser: 1.09 mg/dL — ABNORMAL HIGH (ref 0.44–1.00)
GFR, Estimated: 58 mL/min — ABNORMAL LOW (ref >60)
Glucose, Bld: 113 mg/dL — ABNORMAL HIGH (ref 70–99)
Potassium: 3.8 mmol/L (ref 3.5–5.1)
Sodium: 139 mmol/L (ref 135–145)
Total Bilirubin: 1.2 mg/dL (ref 0.0–1.2)
Total Protein: 7.1 g/dL (ref 6.5–8.1)

## 2023-08-24 LAB — GLUCOSE, CAPILLARY
Glucose-Capillary: 105 mg/dL — ABNORMAL HIGH (ref 70–99)
Glucose-Capillary: 112 mg/dL — ABNORMAL HIGH (ref 70–99)
Glucose-Capillary: 149 mg/dL — ABNORMAL HIGH (ref 70–99)
Glucose-Capillary: 98 mg/dL (ref 70–99)

## 2023-08-24 LAB — PROCALCITONIN: Procalcitonin: <0.1 ng/mL

## 2023-08-24 LAB — PROTIME-INR
INR: 1.3 — ABNORMAL HIGH (ref 0.8–1.2)
Prothrombin Time: 16.3 s — ABNORMAL HIGH (ref 11.4–15.2)

## 2023-08-24 MED ORDER — OXYCODONE HCL 5 MG PO TABS: 5.0000 mg | ORAL_TABLET | Freq: Four times a day (QID) | ORAL | Status: DC | PRN

## 2023-08-24 MED ORDER — AZITHROMYCIN 500 MG PO TABS
500.0000 mg | ORAL_TABLET | Freq: Every day | ORAL | Status: AC
  Administered 2023-08-24 – 2023-08-25 (×2): 500 mg via ORAL
  Filled 2023-08-24 (×2): qty 2

## 2023-08-24 MED ORDER — GUAIFENESIN-DM 100-10 MG/5ML PO SYRP
5.0000 mL | ORAL_SOLUTION | ORAL | Status: DC | PRN
  Administered 2023-08-24 – 2023-08-27 (×10): 5 mL via ORAL
  Filled 2023-08-24 (×10): qty 10

## 2023-08-24 MED ORDER — AZITHROMYCIN 500 MG PO TABS: 500.0000 mg | ORAL_TABLET | Freq: Every day | ORAL | Status: DC

## 2023-08-24 NOTE — TOC Initial Note (Signed)
 Transition of Care [facility]) - Initial/Assessment Note    Patient Details  Name: Lynn Estrada MRN: [id number] Date of Birth: [birth year]  [facility] of [facility]) CM/SW Contact:    Adrian Prows, RN Phone Number: 08/24/2023, 3:20 PM  Clinical Narrative:                 TOC for d/c planning; spoke w/ pt in room; pt says she lives at home w/ her husband Arvie Villarruel [phone]); she plans to return at d/c; pt says her husband will provide transportation; she verified PCP/insurance; she denies SDOH risks; pt says she has cane, crutches, and walker; she does not have HH services or home oxygen; awaiting PT/OT evals; TOC will follow.  Expected Discharge Plan: Home/Self Care Barriers to Discharge: Continued Medical Work up   Patient Goals and CMS Choice Patient states their goals for this hospitalization and ongoing recovery are:: home CMS Medicare.gov Compare Post Acute Care list provided to:: Patient        Expected Discharge Plan and Services   Discharge Planning Services: CM Consult   Living arrangements for the past 2 months: Single Family Home                                      Prior Living Arrangements/Services Living arrangements for the past 2 months: Single Family Home Lives with:: Spouse Patient language and need for interpreter reviewed:: Yes Do you feel safe going back to the place where you live?: Yes      Need for Family Participation in Patient Care: Yes (Comment) Care giver support system in place?: Yes (comment) Current home services: DME (cane, crutches, walker) Criminal Activity/Legal Involvement Pertinent to Current Situation/Hospitalization: No - Comment as needed  Activities of Daily Living   ADL Screening (condition at time of admission) Independently performs ADLs?: No Does the patient have a NEW difficulty with bathing/dressing/toileting/self-feeding that is expected to last >3 days?: Yes (Initiates electronic notice to  provider for possible OT consult) Does the patient have a NEW difficulty with getting in/out of bed, walking, or climbing stairs that is expected to last >3 days?: Yes (Initiates electronic notice to provider for possible PT consult) Does the patient have a NEW difficulty with communication that is expected to last >3 days?: No Is the patient deaf or have difficulty hearing?: No Does the patient have difficulty seeing, even when wearing glasses/contacts?: No Does the patient have difficulty concentrating, remembering, or making decisions?: No  Permission Sought/Granted Permission sought to share information with : Case Manager Permission granted to share information with : Yes, Verbal Permission Granted  Share Information with NAME: Case manager     Permission granted to share info w Relationship: Antonisha Waskey (spouse) [phone]     Emotional Assessment Appearance:: Appears stated age Attitude/Demeanor/Rapport: Gracious Affect (typically observed): Accepting Orientation: : Oriented to Self, Oriented to Place, Oriented to  Time, Oriented to Situation Alcohol / Substance Use: Not Applicable Psych Involvement: No (comment)  Admission diagnosis:  Acute on chronic combined systolic and diastolic heart failure (HCC) [I50.43] Acute cystitis without hematuria [N30.00] Pneumonia of both lower lobes due to infectious organism [J18.9] Acute on chronic congestive heart failure, unspecified heart failure type [facility]) [I50.9] Patient Active Problem List   Diagnosis Date Noted   Acute on chronic combined systolic and diastolic heart failure (HCC) 08/23/2023   Frequency of urination and polyuria 08/23/2023   Cerebrovascular  accident (CVA) (HCC) 05/26/2023   Pyuria 05/25/2023   Left-sided weakness 05/24/2023   Epistaxis 04/22/2023   Benign neoplasm of cecum 03/26/2023   Benign neoplasm of descending colon 03/26/2023   Benign neoplasm of sigmoid colon 03/26/2023   Adhesive capsulitis of right  shoulder 08/24/2022   Mass of right hand 02/22/2022   LBBB (left bundle branch block) 02/01/2022   Type 2 diabetes mellitus with hyperglycemia, without long-term current use of insulin (HCC) 01/22/2022   Hypertriglyceridemia 01/21/2022   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 10/26/2021   Chronic combined systolic and diastolic heart failure (HCC) 07/27/2021   Acute combined systolic (congestive) and diastolic (congestive) heart failure (HCC)    Hypocalcemia 07/12/2021   Elevated troponin 07/12/2021   Lipoma of hand 07/20/2020   Osteoarthritis of carpometacarpal (CMC) joint of thumb 07/20/2020   Pain in right hand 07/20/2020   Radial styloid tenosynovitis 07/20/2020   Other osteoarthritis of spine 10/23/2019   Suspected COVID-19 virus infection 06/11/2019   Acute respiratory failure with hypoxia (HCC) 06/11/2019   Multifocal pneumonia 06/11/2019   Carpal tunnel syndrome, bilateral 04/24/2019   Ureteral stone with hydronephrosis 12/31/2016   Hx of adenomatous colonic polyps 08/03/2016   Normocytic anemia 07/10/2016   CAP (community acquired pneumonia) 07/06/2016   Essential hypertension 07/26/2015   Eczema 07/26/2015   Overweight (BMI 25.0-29.9) 07/26/2015   PCP:  Porfirio Oar, PA Pharmacy:   [phone] [address] [address], [place] - [address] [place] [address] Phone: [phone] Fax: [phone]     Social Drivers of Health (SDOH) Social History: SDOH Screenings   Food Insecurity: No Food Insecurity (08/24/2023)  Housing: Low Risk  (08/24/2023)  Transportation Needs: No Transportation Needs (08/24/2023)  Utilities: Not At Risk (08/24/2023)  Depression (PHQ2-9): Low Risk  (03/16/2022)  Financial Resource Strain: Low Risk  (08/20/2022)   Received from [facility], [facility]  Physical Activity: Insufficiently Active (08/20/2022)   Received from [facility], [facility]  Social Connections: Socially  Integrated (08/20/2022)   Received from [facility], [facility]  Stress: No Stress Concern Present (08/20/2022)   Received from [facility], [facility]  Tobacco Use: Medium Risk (08/23/2023)   SDOH Interventions: Food Insecurity Interventions: Intervention Not Indicated, Inpatient TOC Housing Interventions: Intervention Not Indicated, Inpatient TOC Transportation Interventions: Intervention Not Indicated, Inpatient TOC Utilities Interventions: Intervention Not Indicated, Inpatient TOC   Readmission Risk Interventions     No data to display

## 2023-08-24 NOTE — Progress Notes (Signed)
 TOC for Heart Failure Home Health Screen; orders for Heart Failure Navigation Team previously placed.

## 2023-08-24 NOTE — Progress Notes (Signed)
 PT Cancellation Note  Patient Details Name: Lynn Estrada MRN: [id number] DOB: 1961/05/24   Cancelled Treatment:    Reason Eval/Treat Not Completed: Patient declined, no reason specified    Faye Ramsay, PT Acute Rehabilitation  Office: [phone]

## 2023-08-24 NOTE — Progress Notes (Signed)
 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 In addition to the above she had complaints of frequent urination nausea vomiting and lower abdominal discomfort. RVP Negative BNP 1039  Ua mod LE 50 wbc many bacteria  Wbc 7.8  Chest xray-New right infrahilar heterogeneous airspace opacification and probable similar heterogeneous airspace opacity overlying the left lower lungs/cardiac silhouette. This is suspicious for at least right lower lung pneumonia and possible multifocal pneumonia. Not hypoxic on admit  Assessment & Plan:   Principal Problem:   Acute on chronic combined systolic and diastolic heart failure (HCC) Active Problems:   Essential hypertension   Overweight (BMI 25.0-29.9)   CAP (community acquired pneumonia)   Normocytic anemia   Hypertriglyceridemia   Type 2 diabetes mellitus with hyperglycemia, without long-term current use of insulin (HCC)   Frequency of urination and polyuria   #1 community-acquired pneumonia patient admitted with productive cough pleuritic chest pain generalized weakness.  She is complaining of hemoptysis.  She was not hypoxic on admission she did not have leukocytosis on admission.  Chest x-ray consistent with right lower lobe pneumonia versus possible multifocal pneumonia. Will continue Rocephin and azithromycin for rate. Encourage incentive spirometer Nebs  #2 UTI UA consistent with UTI she was symptomatic with frequency nausea vomiting and lower abdominal discomfort.  Continue Rocephin.  Follow-up urine culture.  #3 acute on chronic combined systolic and diastolic heart failure last echo in November 2024 with a EF  of 20 to 25% and grade 1 diastolic dysfunction continue home Entresto and bisoprolol and Lasix 40 mg twice daily.(Takes Lasix 40 mg once a day at home) Jardiance on hold due to UTI On digoxin, Plavix Dig level 0.6 on admission  #4 essential hypertension on Lasix beta-blocker and Entresto.  #5 hyperlipidemia continue statin  #6 type 2 diabetes on metformin 500 twice daily  #7 overweight BMI 26 complicates overall prognosis and outcome  #8 Prolonged QTC-533 erythomycin decreased to 3 days Avoid qt prolonging agents    Estimated body mass index is 26.52 kg/m as calculated from the following:   Height as of this encounter: 6\' 1"  (1.854 m).   Weight as of this encounter: 91.2 kg.  DVT prophylaxis: lovenox Code Status: full Family Communication:  Disposition Plan:  Status is: Observation The patient remains OBS appropriate and will d/c before 2 midnights.   Consultants:  none  Procedures:none Antimicrobials:rocephin azithro  Subjective:  Resting in bed C/o coughing up blood  Objective: Vitals:   08/24/23 0601 08/24/23 0822 08/24/23 0932 08/24/23 1000  BP: 121/76   108/60  Pulse: (!) 101  98 98  Resp:      Temp: 99.5 F (37.5 C)   (!) 100.4 F (38 C)  TempSrc: Oral   Oral  SpO2: 93% 99%  98%  Weight:      Height:        Intake/Output Summary (Last 24 hours) at 08/24/2023 1325 Last data filed at 08/24/2023 1200 Gross per 24 hour  Intake --  Output 1700 ml  Net -1700 ml   Filed Weights   08/23/23 1245 08/23/23 1629 08/24/23 0500  Weight: 93 kg 93.3 kg 91.2 kg    Examination:  General exam: Appears in no acute distress Respiratory  system: Ronchi to auscultation. Respiratory effort normal. Cardiovascular system: S1 & S2 heard, RRR. No JVD, murmurs, rubs, gallops or clicks. No pedal edema. Gastrointestinal system: Abdomen is nondistended, soft and nontender. No organomegaly or masses felt. Normal bowel sounds heard. Central nervous system: Alert and  oriented. No focal neurological deficits. Extremities: No edema  Data Reviewed: I have personally reviewed following labs and imaging studies  CBC: Recent Labs  Lab 08/23/23 1313 08/24/23 0645  WBC 7.8 7.1  HGB 10.6* 10.1*  HCT 34.0* 32.7*  MCV 90.7 92.6  PLT 217 193   Basic Metabolic Panel: Recent Labs  Lab 08/23/23 1313 08/23/23 2011 08/24/23 0645  NA 139  --  139  K 3.6  --  3.8  CL 103  --  102  CO2 26  --  26  GLUCOSE 106*  --  113*  BUN 14  --  16  CREATININE 0.97  --  1.09*  CALCIUM 9.0  --  8.7*  MG  --  1.9  --   PHOS  --  3.6  --    GFR: Estimated Creatinine Clearance: 69.9 mL/min (A) (by C-G formula based on SCr of 1.09 mg/dL (H)). Liver Function Tests: Recent Labs  Lab 08/23/23 1313 08/24/23 0645  AST 18 19  ALT 19 22  ALKPHOS 44 44  BILITOT 1.1 1.2  PROT 7.6 7.1  ALBUMIN 4.0 3.5   No results for input(s): "LIPASE", "AMYLASE" in the last 168 hours. No results for input(s): "AMMONIA" in the last 168 hours. Coagulation Profile: Recent Labs  Lab 08/24/23 0645  INR 1.3*   Cardiac Enzymes: No results for input(s): "CKTOTAL", "CKMB", "CKMBINDEX", "TROPONINI" in the last 168 hours. BNP (last 3 results) No results for input(s): "PROBNP" in the last 8760 hours. HbA1C: No results for input(s): "HGBA1C" in the last 72 hours. CBG: Recent Labs  Lab 08/23/23 1308 08/23/23 1718 08/23/23 2109 08/24/23 0721 08/24/23 1209  GLUCAP 115* 93 101* 105* 112*   Lipid Profile: No results for input(s): "CHOL", "HDL", "LDLCALC", "TRIG", "CHOLHDL", "LDLDIRECT" in the last 72 hours. Thyroid Function Tests: No results for input(s): "TSH", "T4TOTAL", "FREET4", "T3FREE", "THYROIDAB" in the last 72 hours. Anemia Panel: No results for input(s): "VITAMINB12", "FOLATE", "FERRITIN", "TIBC", "IRON", "RETICCTPCT" in the last 72 hours. Sepsis Labs: Recent Labs  Lab 08/23/23 2011  PROCALCITON <0.10    Recent Results (from the past 240 hours)  Resp panel by  RT-PCR (RSV, Flu A&B, Covid) Anterior Nasal Swab     Status: None   Collection Time: 08/23/23  1:13 PM   Specimen: Anterior Nasal Swab  Result Value Ref Range Status   SARS Coronavirus 2 by RT PCR NEGATIVE NEGATIVE Final    Comment: (NOTE) SARS-CoV-2 target nucleic acids are NOT DETECTED.  The SARS-CoV-2 RNA is generally detectable in upper respiratory specimens during the acute phase of infection. The lowest concentration of SARS-CoV-2 viral copies this assay can detect is 138 copies/mL. A negative result does not preclude SARS-Cov-2 infection and should not be used as the sole basis for treatment or other patient management decisions. A negative result may occur with  improper specimen collection/handling, submission of specimen other than nasopharyngeal swab, presence of viral mutation(s) within the areas targeted by this assay, and inadequate number of viral copies(<138 copies/mL). A negative result must be combined with clinical observations, patient history, and epidemiological information. The expected result is Negative.  Fact Sheet for Patients:  BloggerCourse.com  Fact Sheet for Healthcare Providers:  SeriousBroker.it  This  test is no t yet approved or cleared by the [place] and  has been authorized for detection and/or diagnosis of SARS-CoV-2 by FDA under an Emergency Use Authorization (EUA). This EUA will remain  in effect (meaning this test can be used) for the duration of the COVID-19 declaration under Section 564(b)(1) of the Act, 21 U.S.C.section 360bbb-3(b)(1), unless the authorization is terminated  or revoked sooner.       Influenza A by PCR NEGATIVE NEGATIVE Final   Influenza B by PCR NEGATIVE NEGATIVE Final    Comment: (NOTE) The Xpert Xpress SARS-CoV-2/FLU/RSV plus assay is intended as an aid in the diagnosis of influenza from Nasopharyngeal swab specimens and should not be used as a sole basis  for treatment. Nasal washings and aspirates are unacceptable for Xpert Xpress SARS-CoV-2/FLU/RSV testing.  Fact Sheet for Patients: BloggerCourse.com  Fact Sheet for Healthcare Providers: SeriousBroker.it  This test is not yet approved or cleared by the [place] FDA and has been authorized for detection and/or diagnosis of SARS-CoV-2 by FDA under an Emergency Use Authorization (EUA). This EUA will remain in effect (meaning this test can be used) for the duration of the COVID-19 declaration under Section 564(b)(1) of the Act, 21 U.S.C. section 360bbb-3(b)(1), unless the authorization is terminated or revoked.     Resp Syncytial Virus by PCR NEGATIVE NEGATIVE Final    Comment: (NOTE) Fact Sheet for Patients: BloggerCourse.com  Fact Sheet for Healthcare Providers: SeriousBroker.it  This test is not yet approved or cleared by the [place] FDA and has been authorized for detection and/or diagnosis of SARS-CoV-2 by FDA under an Emergency Use Authorization (EUA). This EUA will remain in effect (meaning this test can be used) for the duration of the COVID-19 declaration under Section 564(b)(1) of the Act, 21 U.S.C. section 360bbb-3(b)(1), unless the authorization is terminated or revoked.  Performed at [facility], [address], [place], [place] [address]          Radiology Studies: DG Chest Portable 1 View Result Date: 08/23/2023 CLINICAL DATA:  Productive cough, fatigue, and headache. EXAM: PORTABLE CHEST 1 VIEW COMPARISON:  Chest radiograph 01/14/2023 and 05/14/2022 FINDINGS: Chest wall cardiac AICD is again seen with leads not significantly changed. Cardiac silhouette and mediastinal contours are unchanged and within normal limits with moderate calcification within the aortic arch. New right infrahilar heterogeneous airspace opacification.  Probable similar heterogeneous airspace opacity overlying the left lower lungs/cardiac silhouette. No definite pleural effusion. No pneumothorax. Mild dextrocurvature of the midthoracic spine. Moderate multilevel degenerative disc and endplate changes. IMPRESSION: New right infrahilar heterogeneous airspace opacification and probable similar heterogeneous airspace opacity overlying the left lower lungs/cardiac silhouette. This is suspicious for at least right lower lung pneumonia and possible multifocal pneumonia. Electronically Signed   By: Neita Garnet M.D.   On: 08/23/2023 15:01        Scheduled Meds:  atorvastatin  10 mg Oral Daily   azithromycin  500 mg Oral Daily   bisoprolol  2.5 mg Oral Daily   budesonide (PULMICORT) nebulizer solution  0.5 mg Nebulization BID   clopidogrel  75 mg Oral Daily   digoxin  125 mcg Oral Daily   feeding supplement  237 mL Oral BID BM   furosemide  40 mg Intravenous BID   guaiFENesin  600 mg Oral BID   insulin aspart  0-15 Units Subcutaneous TID WC   metFORMIN  500 mg Oral BID WC   potassium chloride  40 mEq Oral Daily  sacubitril-valsartan  1 tablet Oral BID   Continuous Infusions:  cefTRIAXone (ROCEPHIN)  IV       LOS: 0 days    Alwyn Ren, MD  08/24/2023, 1:25 PM

## 2023-08-25 ENCOUNTER — Inpatient Hospital Stay (HOSPITAL_COMMUNITY): Payer: Medicaid Other

## 2023-08-25 DIAGNOSIS — I5043 Acute on chronic combined systolic (congestive) and diastolic (congestive) heart failure: Secondary | ICD-10-CM | POA: Diagnosis not present

## 2023-08-25 DIAGNOSIS — R9431 Abnormal electrocardiogram [ECG] [EKG]: Secondary | ICD-10-CM | POA: Diagnosis not present

## 2023-08-25 LAB — BASIC METABOLIC PANEL
Anion gap: 9 (ref 5–15)
BUN: 18 mg/dL (ref 8–23)
CO2: 27 mmol/L (ref 22–32)
Calcium: 8.8 mg/dL — ABNORMAL LOW (ref 8.9–10.3)
Chloride: 100 mmol/L (ref 98–111)
Creatinine, Ser: 1.05 mg/dL — ABNORMAL HIGH (ref 0.44–1.00)
GFR, Estimated: >60 mL/min (ref >60)
Glucose, Bld: 113 mg/dL — ABNORMAL HIGH (ref 70–99)
Potassium: 4.2 mmol/L (ref 3.5–5.1)
Sodium: 136 mmol/L (ref 135–145)

## 2023-08-25 LAB — GLUCOSE, CAPILLARY
Glucose-Capillary: 107 mg/dL — ABNORMAL HIGH (ref 70–99)
Glucose-Capillary: 122 mg/dL — ABNORMAL HIGH (ref 70–99)
Glucose-Capillary: 93 mg/dL (ref 70–99)
Glucose-Capillary: 123 mg/dL — ABNORMAL HIGH (ref 70–99)

## 2023-08-25 LAB — ECHOCARDIOGRAM COMPLETE
Area-P 1/2: 7.66 cm2
Height: 73 in
S' Lateral: 6.5 cm
Weight: 3203.2 [oz_av]

## 2023-08-25 MED ORDER — SODIUM CHLORIDE 0.9 % IV BOLUS
250.0000 mL | Freq: Once | INTRAVENOUS | Status: AC
  Administered 2023-08-25: 250 mL via INTRAVENOUS

## 2023-08-25 MED ORDER — FUROSEMIDE 10 MG/ML IJ SOLN
20.0000 mg | Freq: Two times a day (BID) | INTRAMUSCULAR | Status: DC
  Administered 2023-08-25: 20 mg via INTRAVENOUS
  Filled 2023-08-25 (×2): qty 2

## 2023-08-25 NOTE — Evaluation (Signed)
 Occupational Therapy Evaluation Patient Details Name: Lynn Estrada MRN: [id number] DOB: Mar 22, 1961 Today's Date: 08/25/2023   History of Present Illness   Pt is a 63 y/o female admitted 08/23/23 due to SOB, productive cough, chest pain, and generalized weakness. Dx with Acute on chronic combined systolic and diastolic heart failure, community acquired pneumonia, and UTI. PMH includes Anemia, Arthritis, CHF (12/06/2022), COPD, DM, History of chronic gastritis (08/03/2016), Partial nephrectomy, adenomatous colonic polyps (08/03/2016), Hyperlipidemia, HTN, MI, Nephrolithiasis, Urgency of urination, Lumbar disc surgery (6962,9528); R/L Heart Cath and Coronary Angiography (07/18/2021); and polypectomy (03/26/2023), and CVA 2024.     Clinical Impressions Pt is typically independent in ADL and mobility without DME. Today she used a RW for transfers and mobility to assist with energy conservation and balance. 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 Today she is overall GCA for standing grooming, seated dressing UB and LB, and was eager for hallway mobility. She states that at home she sponge bathes because she has a big garden tub and another tub/shower combo and is fearful of falling and getting hurt. At this time recommending HHOT to maximize safety and independence in home setting for ADL and functional transfers with special emphasis on tub transfers, fall and safety eval, and address RUE deficits in home setting. Initiated energy conservation strategies today, but next acute session focus on RUE and energy conservation.      If plan is discharge home, recommend the following:   A Bal[place]ak help with walking and/or transfers;A Donate help with  bathing/dressing/bathroom;Assistance with cooking/housework;Assist for transportation;Help with stairs or ramp for entrance     Functional Status Assessment   Patient has had a recent decline in their functional status and demonstrates the ability to make significant improvements in function in a reasonable and predictable amount of time.     Equipment Recommendations   BSC/3in1;Tub/shower bench     Recommendations for Other Services   PT consult     Precautions/Restrictions   Precautions Precautions: Fall Restrictions Weight Bearing Restrictions Per Provider Order: No     Mobility Bed Mobility Overal bed mobility: Modified Independent                  Transfers Overall transfer level: Needs assistance Equipment used: Rolling walker (2 wheels) Transfers: Sit to/from Stand, Bed to chair/wheelchair/BSC Sit to Stand: Supervision     Step pivot transfers: Supervision     General transfer comment: line management with IV      Balance Overall balance assessment: Needs assistance Sitting-balance support: No upper extremity supported, Feet supported Sitting balance-Leahy Scale: Good     Standing balance support: Single extremity supported, During functional activity, No upper extremity supported Standing balance-Leahy Scale: Fair (improved dynamic balance with RW, static standing balance fair without DME)                             ADL either performed or assessed with clinical judgement   ADL Overall ADL's : Needs assistance/impaired Eating/Feeding: Independent   Grooming: Minimal assistance;Standing;Supervision/safety;Wash/dry hands Grooming Details (indicate cue type and reason): all grooming that is on the face or front of body is sup level,  Pt requires assist for hair with RUE Upper Body Bathing: Supervision/ safety;Standing Upper Body Bathing Details (indicate cue type and reason): Pt has been sponge bathing for fear of getting in  and out of tub Lower Body Bathing: Contact guard assist Lower Body Bathing Details (indicate cue type and reason): Pt has been sponge bathing at baseline due to fear of falling and fear of tub transfers Upper Body Dressing : Modified independent   Lower Body Dressing: Contact guard assist;Sit to/from stand   Toilet Transfer: Supervision/safety;Ambulation;Rolling walker (2 wheels);Regular Toilet;Grab bars   Toileting- Clothing Manipulation and Hygiene: Supervision/safety;Sitting/lateral lean Toileting - Clothing Manipulation Details (indicate cue type and reason): Pt able to perform peri care and manage underwear up and down     Functional mobility during ADLs: Supervision/safety;Rolling walker (2 wheels);Cueing for safety General ADL Comments: decreased activity tolerance, RUE decreased ROM and pain, tub transfers     Vision Baseline Vision/History: 1 Wears glasses Ability to See in Adequate Light: 0 Adequate Patient Visual Report: No change from baseline Vision Assessment?: No apparent visual deficits     Perception         Praxis         Pertinent Vitals/Pain Pain Assessment Pain Assessment: Faces Faces Pain Scale: Hurts Gaunce more Pain Location: L side from laying in bed Pain Descriptors / Indicators: Sore Pain Intervention(s): Monitored during session, Repositioned     Extremity/Trunk Assessment Upper Extremity Assessment Upper Extremity Assessment: Right hand dominant;RUE deficits/detail RUE Deficits / Details: shoulder deficits at baseline, strength 5/5 generally, decreased FF of shoulder and ability to reach up and behind head - says it has been this way for approx 8 months RUE: Shoulder pain with ROM RUE Sensation: WNL RUE Coordination: decreased gross motor   Lower Extremity Assessment Lower Extremity Assessment: Defer to PT evaluation   Cervical / Trunk Assessment Cervical / Trunk Assessment: Normal   Communication Communication Communication: No  apparent difficulties   Cognition Arousal: Alert Behavior During Therapy: WFL for tasks assessed/performed, Flat affect Cognition: No apparent impairments             OT - Cognition Comments: slightly labile - emotional about lack of energy                 Following commands: Intact       Cueing  General Comments          Exercises     Shoulder Instructions      Home Living Family/patient expects to be discharged to:: Private residence Living Arrangements: Spouse/significant other;Children Available Help at Discharge: Family;Available 24 hours/day Type of Home: House Home Access: Stairs to enter [employer] of Steps: 1 small Entrance Stairs-Rails: None Home Layout: One level     Bathroom Shower/Tub: Tub/shower unit (garden tub)   Bathroom Toilet: Handicapped height Bathroom Accessibility: Yes How Accessible: Accessible via walker;Accessible via wheelchair Home Equipment: Rolling Walker (2 wheels);Cane - single point;Crutches          Prior Functioning/Environment Prior Level of Function : Independent/Modified Independent;Working/employed (has been working at [employer])             Mobility Comments: no DME ADLs Comments: No assist at baseline for ADL or IADL    OT Problem List: Decreased range of motion;Decreased activity tolerance;Impaired balance (sitting and/or standing);Decreased knowledge of use of DME or AE;Impaired UE functional use;Pain   OT Treatment/Interventions: Self-care/ADL training;DME and/or AE instruction;Therapeutic activities;Patient/family education;Balance training      OT Goals(Current goals can be  found in the care plan section)   Acute Rehab OT Goals Patient Stated Goal: get my energy back OT Goal Formulation: With patient/family Time For Goal Achievement: 09/08/23 Potential to Achieve Goals: Good ADL Goals Pt Will Perform Grooming: with modified independence;standing Pt Will Perform Upper Body  Dressing: with modified independence;sitting Pt Will Perform Lower Body Dressing: with modified independence;with adaptive equipment;sit to/from stand Pt Will Transfer to Toilet: with modified independence;ambulating Pt Will Perform Toileting - Clothing Manipulation and hygiene: with modified independence;sit to/from stand;sitting/lateral leans Additional ADL Goal #1: Pt will verbalize at least 3 strategies for energy conservation during ADL with no cues   OT Frequency:  Min 1X/week    Co-evaluation              AM-PAC OT "6 Clicks" Daily Activity     Outcome Measure Help from another person eating meals?: None Help from another person taking care of personal grooming?: A Bodie Help from another person toileting, which includes using toliet, bedpan, or urinal?: A Kimrey Help from another person bathing (including washing, rinsing, drying)?: A Lot Help from another person to put on and taking off regular upper body clothing?: None Help from another person to put on and taking off regular lower body clothing?: A Gerdts 6 Click Score: 19   End of Session Equipment Utilized During Treatment: Gait belt;Rolling walker (2 wheels) Nurse Communication: Mobility status  Activity Tolerance: Patient tolerated treatment well Patient left: in bed;with call bell/phone within reach;with family/visitor present (sitting EOB)  OT Visit Diagnosis: Muscle weakness (generalized) (M62.81);Pain Pain - Right/Left: Right Pain - part of body: Shoulder                Time: 4098-1191 OT Time Calculation (min): 33 min Charges:  OT General Charges $OT Visit: 1 Visit OT Evaluation $OT Eval Moderate Complexity: 1 Mod OT Treatments $Self Care/Home Management : 8-22 mins  Nyoka Cowden OTR/L Acute Rehabilitation Services Office: [phone]  Evern Bio [facility] 08/25/2023, 5:35 PM

## 2023-08-25 NOTE — Plan of Care (Signed)
  Problem: Education: Goal: Knowledge of General Education information will improve Description: Including pain rating scale, medication(s)/side effects and non-pharmacologic comfort measures 08/25/2023 2154 by Gareth Eagle, RN Outcome: Progressing 08/25/2023 2154 by Gareth Eagle, RN Outcome: Progressing   Problem: Health Behavior/Discharge Planning: Goal: Ability to manage health-related needs will improve 08/25/2023 2154 by Gareth Eagle, RN Outcome: Progressing 08/25/2023 2154 by Gareth Eagle, RN Outcome: Progressing   Problem: Activity: Goal: Risk for activity intolerance will decrease 08/25/2023 2154 by Gareth Eagle, RN Outcome: Progressing 08/25/2023 2154 by Gareth Eagle, RN Outcome: Progressing

## 2023-08-25 NOTE — Plan of Care (Signed)
  Problem: Education: Goal: Knowledge of General Education information will improve Description: Including pain rating scale, medication(s)/side effects and non-pharmacologic comfort measures Outcome: Progressing   Problem: Health Behavior/Discharge Planning: Goal: Ability to manage health-related needs will improve Outcome: Progressing   Problem: Activity: Goal: Risk for activity intolerance will decrease Outcome: Progressing   Problem: Nutrition: Goal: Adequate nutrition will be maintained Outcome: Progressing   Problem: Safety: Goal: Ability to remain free from injury will improve Outcome: Progressing   Problem: Education: Goal: Ability to describe self-care measures that may prevent or decrease complications (Diabetes Survival Skills Education) will improve Outcome: Progressing Goal: Individualized Educational Video(s) Outcome: Progressing

## 2023-08-25 NOTE — Progress Notes (Addendum)
 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 In addition to the above she had complaints of frequent urination nausea vomiting and lower abdominal discomfort. RVP Negative BNP 1039  Ua mod LE 50 wbc many bacteria urine culture with more than 100,000 colonies of gram-negative rods Wbc 7.8  Chest xray-New right infrahilar heterogeneous airspace opacification and probable similar heterogeneous airspace opacity overlying the left lower lungs/cardiac silhouette. This is suspicious for at least right lower lung pneumonia and possible multifocal pneumonia. Not hypoxic on admit  Assessment & Plan:   Principal Problem:   Acute on chronic combined systolic and diastolic heart failure (HCC) Active Problems:   Essential hypertension   Overweight (BMI 25.0-29.9)   CAP (community acquired pneumonia)   Normocytic anemia   Hypertriglyceridemia   Type 2 diabetes mellitus with hyperglycemia, without long-term current use of insulin (HCC)   Frequency of urination and polyuria   #1CAP- patient admitted with productive cough pleuritic chest pain generalized weakness.  She is complaining of hemoptysis.  She was not hypoxic on admission she did not have leukocytosis on admission.  Chest x-ray consistent with right lower lobe pneumonia versus possible multifocal pneumonia. Will continue Rocephin and azithromycin. Encourage incentive spirometer Nebs OOB AMBULATE  #2 UTI UA consistent with UTI she was symptomatic with frequency nausea vomiting and lower abdominal discomfort.  Continue Rocephin.  Follow-up urine culture GNR  #3 acute on chronic combined systolic and diastolic  heart failure last echo in November 2024 with a EF of 20 to 25% and grade 1 diastolic dysfunction  Due to soft blood pressure Entresto Lasix bisoprolol has been on hold Jardiance on hold due to UTI On digoxin, Plavix Dig level 0.6 on admission FOLLOW up on echo this admit  #4 essential hypertension blood pressure running soft antihypertensives on hold   #5 hyperlipidemia continue statin  #6 type 2 diabetes on metformin 500 twice daily A1C 7.1 CBG (last 3)  Recent Labs    08/24/23 1741 08/24/23 2147 08/25/23 0733  GLUCAP 149* 98 107*   #7 overweight BMI 26 complicates overall prognosis and outcome  #8 Prolonged QTC-533 erythomycin decreased to 3 days Avoid qt prolonging agents    Estimated body mass index is 26.41 kg/m as calculated from the following:   Height as of this encounter: 6\' 1"  (1.854 m).   Weight as of this encounter: 90.8 kg.  DVT prophylaxis: lovenox Code Status: full Family Communication: none Disposition Plan:  Status is: IP    Consultants:  none  Procedures:none Antimicrobials:rocephin azithro  Subjective:  She is resting in bed able to lay flat not hypoxic on room air did cough up some blood but less than yesterday   objective: Vitals:   08/24/23 2245 08/25/23 0432 08/25/23 0949 08/25/23 1020  BP:  105/78  95/66  Pulse:  92  92  Resp:  17    Temp:  (!) 100.5 F (38.1 C)    TempSrc:  Oral    SpO2: 95% 98% 98%   Weight:  90.8 kg    Height:        Intake/Output Summary (Last 24 hours) at 08/25/2023 1053 Last data filed at 08/25/2023 0300 Gross per 24 hour  Intake 100 ml  Output  600 ml  Net -500 ml   Filed Weights   08/23/23 1629 08/24/23 0500 08/25/23 0432  Weight: 93.3 kg 91.2 kg 90.8 kg    Examination:  General exam: Appears in no acute distress Respiratory system: Ronchi to auscultation. Respiratory effort normal. Cardiovascular system: S1 & S2 heard, RRR. No JVD, murmurs, rubs, gallops or clicks. No pedal  edema. Gastrointestinal system: Abdomen is nondistended, soft and nontender. No organomegaly or masses felt. Normal bowel sounds heard. Central nervous system: Alert and oriented. No focal neurological deficits. Extremities: No edema  Data Reviewed: I have personally reviewed following labs and imaging studies  CBC: Recent Labs  Lab 08/23/23 1313 08/24/23 0645  WBC 7.8 7.1  HGB 10.6* 10.1*  HCT 34.0* 32.7*  MCV 90.7 92.6  PLT 217 193   Basic Metabolic Panel: Recent Labs  Lab 08/23/23 1313 08/23/23 2011 08/24/23 0645  NA 139  --  139  K 3.6  --  3.8  CL 103  --  102  CO2 26  --  26  GLUCOSE 106*  --  113*  BUN 14  --  16  CREATININE 0.97  --  1.09*  CALCIUM 9.0  --  8.7*  MG  --  1.9  --   PHOS  --  3.6  --    GFR: Estimated Creatinine Clearance: 69.8 mL/min (A) (by C-G formula based on SCr of 1.09 mg/dL (H)). Liver Function Tests: Recent Labs  Lab 08/23/23 1313 08/24/23 0645  AST 18 19  ALT 19 22  ALKPHOS 44 44  BILITOT 1.1 1.2  PROT 7.6 7.1  ALBUMIN 4.0 3.5   No results for input(s): "LIPASE", "AMYLASE" in the last 168 hours. No results for input(s): "AMMONIA" in the last 168 hours. Coagulation Profile: Recent Labs  Lab 08/24/23 0645  INR 1.3*   Cardiac Enzymes: No results for input(s): "CKTOTAL", "CKMB", "CKMBINDEX", "TROPONINI" in the last 168 hours. BNP (last 3 results) No results for input(s): "PROBNP" in the last 8760 hours. HbA1C: No results for input(s): "HGBA1C" in the last 72 hours. CBG: Recent Labs  Lab 08/24/23 0721 08/24/23 1209 08/24/23 1741 08/24/23 2147 08/25/23 0733  GLUCAP 105* 112* 149* 98 107*   Lipid Profile: No results for input(s): "CHOL", "HDL", "LDLCALC", "TRIG", "CHOLHDL", "LDLDIRECT" in the last 72 hours. Thyroid Function Tests: No results for input(s): "TSH", "T4TOTAL", "FREET4", "T3FREE", "THYROIDAB" in the last 72 hours. Anemia Panel: No results for input(s): "VITAMINB12", "FOLATE", "FERRITIN", "TIBC", "IRON",  "RETICCTPCT" in the last 72 hours. Sepsis Labs: Recent Labs  Lab 08/23/23 2011 08/24/23 0645  PROCALCITON <0.10 <0.10    Recent Results (from the past 240 hours)  Resp panel by RT-PCR (RSV, Flu A&B, Covid) Anterior Nasal Swab     Status: None   Collection Time: 08/23/23  1:13 PM   Specimen: Anterior Nasal Swab  Result Value Ref Range Status   SARS Coronavirus 2 by RT PCR NEGATIVE NEGATIVE Final    Comment: (NOTE) SARS-CoV-2 target nucleic acids are NOT DETECTED.  The SARS-CoV-2 RNA is generally detectable in upper respiratory specimens during the acute phase of infection. The lowest concentration of SARS-CoV-2 viral copies this assay can detect is 138 copies/mL. A negative result does not preclude SARS-Cov-2 infection and should not be used as the sole basis for treatment or other patient management decisions. A negative result may occur with  improper specimen collection/handling, submission of specimen other than nasopharyngeal swab, presence of viral mutation(s) within the areas targeted by this assay, and  inadequate number of viral copies(<138 copies/mL). A negative result must be combined with clinical observations, patient history, and epidemiological information. The expected result is Negative.  Fact Sheet for Patients:  BloggerCourse.com  Fact Sheet for Healthcare Providers:  SeriousBroker.it  This test is no t yet approved or cleared by the [place] FDA and  has been authorized for detection and/or diagnosis of SARS-CoV-2 by FDA under an Emergency Use Authorization (EUA). This EUA will remain  in effect (meaning this test can be used) for the duration of the COVID-19 declaration under Section 564(b)(1) of the Act, 21 U.S.C.section 360bbb-3(b)(1), unless the authorization is terminated  or revoked sooner.       Influenza A by PCR NEGATIVE NEGATIVE Final   Influenza B by PCR NEGATIVE NEGATIVE Final     Comment: (NOTE) The Xpert Xpress SARS-CoV-2/FLU/RSV plus assay is intended as an aid in the diagnosis of influenza from Nasopharyngeal swab specimens and should not be used as a sole basis for treatment. Nasal washings and aspirates are unacceptable for Xpert Xpress SARS-CoV-2/FLU/RSV testing.  Fact Sheet for Patients: BloggerCourse.com  Fact Sheet for Healthcare Providers: SeriousBroker.it  This test is not yet approved or cleared by the [place] FDA and has been authorized for detection and/or diagnosis of SARS-CoV-2 by FDA under an Emergency Use Authorization (EUA). This EUA will remain in effect (meaning this test can be used) for the duration of the COVID-19 declaration under Section 564(b)(1) of the Act, 21 U.S.C. section 360bbb-3(b)(1), unless the authorization is terminated or revoked.     Resp Syncytial Virus by PCR NEGATIVE NEGATIVE Final    Comment: (NOTE) Fact Sheet for Patients: BloggerCourse.com  Fact Sheet for Healthcare Providers: SeriousBroker.it  This test is not yet approved or cleared by the [place] FDA and has been authorized for detection and/or diagnosis of SARS-CoV-2 by FDA under an Emergency Use Authorization (EUA). This EUA will remain in effect (meaning this test can be used) for the duration of the COVID-19 declaration under Section 564(b)(1) of the Act, 21 U.S.C. section 360bbb-3(b)(1), unless the authorization is terminated or revoked.  Performed at [facility], [address] [place], [place] [address]   Urine Culture     Status: Abnormal (Preliminary result)   Collection Time: 08/23/23  3:45 PM   Specimen: Urine, Clean Catch  Result Value Ref Range Status   Specimen Description   Final    URINE, CLEAN CATCH Performed at [facility], [address] [place], [place] [address]    Special  Requests   Final    NONE Performed at [facility], [address], [place], [place] [address]    Culture (A)  Final    >=100,000 COLONIES/mL GRAM NEGATIVE RODS SUSCEPTIBILITIES TO FOLLOW Performed at [facility], [address] [place], [place] [address]    Report Status PENDING  Incomplete         Radiology Studies: DG Chest Portable 1 View Result Date: 08/23/2023 CLINICAL DATA:  Productive cough, fatigue, and headache. EXAM: PORTABLE CHEST 1 VIEW COMPARISON:  Chest radiograph 01/14/2023 and 05/14/2022 FINDINGS: Chest wall cardiac AICD is again seen with leads not significantly changed. Cardiac silhouette and mediastinal contours are unchanged and within normal limits with moderate calcification within the aortic arch. New right infrahilar heterogeneous airspace opacification. Probable similar heterogeneous airspace opacity overlying the left lower lungs/cardiac silhouette. No definite pleural effusion. No pneumothorax. Mild dextrocurvature of the midthoracic spine. Moderate multilevel degenerative disc and  endplate changes. IMPRESSION: New right infrahilar heterogeneous airspace opacification and probable similar heterogeneous airspace opacity overlying the left lower lungs/cardiac silhouette. This is suspicious for at least right lower lung pneumonia and possible multifocal pneumonia. Electronically Signed   By: Neita Garnet M.D.   On: 08/23/2023 15:01   Scheduled Meds:  atorvastatin  10 mg Oral Daily   bisoprolol  2.5 mg Oral Daily   budesonide (PULMICORT) nebulizer solution  0.5 mg Nebulization BID   clopidogrel  75 mg Oral Daily   digoxin  125 mcg Oral Daily   feeding supplement  237 mL Oral BID BM   furosemide  40 mg Intravenous BID   guaiFENesin  600 mg Oral BID   insulin aspart  0-15 Units Subcutaneous TID WC   metFORMIN  500 mg Oral BID WC   potassium chloride  40 mEq Oral Daily   sacubitril-valsartan  1 tablet Oral BID   Continuous  Infusions:  cefTRIAXone (ROCEPHIN)  IV 2 g (08/24/23 1501)    LOS: 1 day   Alwyn Ren, MD  08/25/2023, 10:53 AM

## 2023-08-25 NOTE — Evaluation (Addendum)
 Physical Therapy Evaluation Patient Details Name: Lynn Estrada MRN: [id number] DOB: 1961/05/25 Today's Date: 08/25/2023  History of Present Illness  Pt is a 63 y/o female admitted 08/23/23 due to SOB, productive cough, chest pain, and generalized weakness. Dx with Acute on chronic combined systolic and diastolic heart failure, community acquired pneumonia, and UTI. PMH includes Anemia, Arthritis, CHF (12/06/2022), COPD, DM, History of chronic gastritis (08/03/2016), Partial nephrectomy, adenomatous colonic polyps (08/03/2016), Hyperlipidemia, HTN, MI, Nephrolithiasis, Urgency of urination, Lumbar disc surgery (4098,1191); R/L Heart Cath and Coronary Angiography (07/18/2021); and polypectomy (03/26/2023), and CVA 2024.  Clinical Impression  On eval, pt was CGA-Mod Ind for mobility. She walked ~100 feet with a RW. Pt presents with general weakness, decreased activity tolerance, and impaired gait and balance. Discussed d/c plan-pt politely declines PT f/u after discharge. *Addendum: OT reports pt may be agreeable to f/u therapy. Recommend HHPT f/u*. Recommend daily ambulation in hallway with nursing and/or mobility team as able.        If plan is discharge home, recommend the following: A Weiand help with walking and/or transfers;A Ra help with bathing/dressing/bathroom;Assistance with cooking/housework;Assist for transportation;Help with stairs or ramp for entrance   Can travel by private vehicle        Equipment Recommendations None recommended by PT  Recommendations for Other Services       Functional Status Assessment Patient has had a recent decline in their functional status and demonstrates the ability to make significant improvements in function in a reasonable and predictable amount of time.     Precautions / Restrictions Precautions Precautions: Fall Restrictions Weight Bearing Restrictions Per Provider Order: No      Mobility  Bed Mobility Overal bed mobility:  Modified Independent                  Transfers Overall transfer level: Modified independent Equipment used: Rolling walker (2 wheels)                    Ambulation/Gait Ambulation/Gait assistance: Contact guard assist Gait Distance (Feet): 100 Feet Assistive device: Rolling walker (2 wheels) Gait Pattern/deviations: Step-through pattern, Decreased stride length       General Gait Details: Slow, effortful gait. Fatigues fairly easily.  Stairs            Wheelchair Mobility     Tilt Bed    Modified Rankin (Stroke Patients Only)       Balance Overall balance assessment: Needs assistance           Standing balance-Leahy Scale: Fair                               Pertinent Vitals/Pain Pain Assessment Pain Assessment: No/denies pain    Home Living Family/patient expects to be discharged to:: Private residence Living Arrangements: Spouse/significant other;Children Available Help at Discharge: Family;Available 24 hours/day Type of Home: House Home Access: Stairs to enter Entrance Stairs-Rails: None Entrance Stairs-Number of Steps: 1 small   Home Layout: One level Home Equipment: Agricultural consultant (2 wheels);Cane - single point;Crutches      Prior Function Prior Level of Function : Independent/Modified Independent;Driving               ADLs Comments: No assist at baseline for ADL or IADL     Extremity/Trunk Assessment   Upper Extremity Assessment Upper Extremity Assessment: Defer to OT evaluation    Lower Extremity Assessment Lower Extremity Assessment: Generalized weakness  Cervical / Trunk Assessment Cervical / Trunk Assessment: Normal  Communication   Communication Communication: No apparent difficulties    Cognition Arousal: Alert Behavior During Therapy: WFL for tasks assessed/performed, Flat affect   PT - Cognitive impairments: No apparent impairments                         Following  commands: Intact       Cueing       General Comments      Exercises     Assessment/Plan    PT Assessment Patient needs continued PT services  PT Problem List Decreased strength;Decreased range of motion;Decreased activity tolerance;Decreased balance;Decreased mobility       PT Treatment Interventions DME instruction;Gait training;Functional mobility training;Therapeutic activities;Therapeutic exercise;Patient/family education;Balance training    PT Goals (Current goals can be found in the Care Plan section)  Acute Rehab PT Goals Patient Stated Goal: home soon. feel better. PT Goal Formulation: With patient Time For Goal Achievement: 09/08/23 Potential to Achieve Goals: Good    Frequency Min 1X/week     Co-evaluation               AM-PAC PT "6 Clicks" Mobility  Outcome Measure Help needed turning from your back to your side while in a flat bed without using bedrails?: None Help needed moving from lying on your back to sitting on the side of a flat bed without using bedrails?: None Help needed moving to and from a bed to a chair (including a wheelchair)?: None Help needed standing up from a chair using your arms (e.g., wheelchair or bedside chair)?: None Help needed to walk in hospital room?: A Resnick Help needed climbing 3-5 steps with a railing? : A Nabozny 6 Click Score: 22    End of Session Equipment Utilized During Treatment: Gait belt Activity Tolerance: Patient tolerated treatment well Patient left: in bed;with call bell/phone within reach   PT Visit Diagnosis: Muscle weakness (generalized) (M62.81);Difficulty in walking, not elsewhere classified (R26.2)    Time: 1191-4782 PT Time Calculation (min) (ACUTE ONLY): 10 min   Charges:   PT Evaluation $PT Eval Low Complexity: 1 Low   PT General Charges $$ ACUTE PT VISIT: 1 Visit            Faye Ramsay, PT Acute Rehabilitation  Office: [phone]

## 2023-08-26 DIAGNOSIS — I5043 Acute on chronic combined systolic (congestive) and diastolic (congestive) heart failure: Secondary | ICD-10-CM | POA: Diagnosis not present

## 2023-08-26 DIAGNOSIS — I10 Essential (primary) hypertension: Secondary | ICD-10-CM | POA: Diagnosis not present

## 2023-08-26 LAB — URINE CULTURE

## 2023-08-26 LAB — BASIC METABOLIC PANEL
Anion gap: 11 (ref 5–15)
BUN: 21 mg/dL (ref 8–23)
CO2: 25 mmol/L (ref 22–32)
Calcium: 8.4 mg/dL — ABNORMAL LOW (ref 8.9–10.3)
Chloride: 100 mmol/L (ref 98–111)
Creatinine, Ser: 1.13 mg/dL — ABNORMAL HIGH (ref 0.44–1.00)
GFR, Estimated: 55 mL/min — ABNORMAL LOW (ref >60)
Glucose, Bld: 103 mg/dL — ABNORMAL HIGH (ref 70–99)
Potassium: 3.8 mmol/L (ref 3.5–5.1)
Sodium: 136 mmol/L (ref 135–145)

## 2023-08-26 LAB — LACTIC ACID, PLASMA: Lactic Acid, Venous: 0.9 mmol/L (ref 0.5–1.9)

## 2023-08-26 LAB — GLUCOSE, CAPILLARY
Glucose-Capillary: 91 mg/dL (ref 70–99)
Glucose-Capillary: 121 mg/dL — ABNORMAL HIGH (ref 70–99)
Glucose-Capillary: 102 mg/dL — ABNORMAL HIGH (ref 70–99)
Glucose-Capillary: 114 mg/dL — ABNORMAL HIGH (ref 70–99)

## 2023-08-26 MED ORDER — DOCUSATE SODIUM 50 MG PO CAPS
50.0000 mg | ORAL_CAPSULE | Freq: Once | ORAL | Status: AC
Start: 2023-08-26 — End: 2023-08-26
  Administered 2023-08-26: 50 mg via ORAL
  Filled 2023-08-26: qty 1

## 2023-08-26 MED ORDER — FUROSEMIDE 40 MG PO TABS: 40.0000 mg | ORAL_TABLET | Freq: Every day | ORAL | Status: DC

## 2023-08-26 NOTE — Progress Notes (Signed)
 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 In addition to the above she had complaints of frequent urination nausea vomiting and lower abdominal discomfort. RVP Negative BNP 1039  Ua mod LE 50 wbc many bacteria urine culture with more than 100,000 colonies of gram-negative rods Wbc 7.8  Chest xray-New right infrahilar heterogeneous airspace opacification and probable similar heterogeneous airspace opacity overlying the left lower lungs/cardiac silhouette. This is suspicious for at least right lower lung pneumonia and possible multifocal pneumonia. Not hypoxic on admit  Assessment & Plan:   Principal Problem:   Acute on chronic combined systolic and diastolic heart failure (HCC) Active Problems:   Essential hypertension   Overweight (BMI 25.0-29.9)   CAP (community acquired pneumonia)   Normocytic anemia   Hypertriglyceridemia   Type 2 diabetes mellitus with hyperglycemia, without long-term current use of insulin (HCC)   Frequency of urination and polyuria   #1CAP- patient admitted with productive cough pleuritic chest pain generalized weakness.  She is complaining of hemoptysis.  She was not hypoxic on admission she did not have leukocytosis on admission.  Chest x-ray consistent with right lower lobe pneumonia versus possible multifocal pneumonia. Will continue Rocephin and azithromycin. Encourage incentive spirometer Nebs OOB AMBULATE  #2 UTI UA consistent with UTI she was symptomatic with frequency nausea vomiting and lower abdominal discomfort.  Continue Rocephin.  Follow-up urine culture GNR klebsiella S to rocephin  #3 acute on chronic combined  systolic and diastolic heart failure last echo in November 2024 with a EF of 20 to 25% and grade 1 diastolic dysfunction  Repeat echo 2/23 ef 25 % grade 2 DD Due to soft blood pressure Entresto Lasix bisoprolol has been on hold Jardiance on hold due to UTI On digoxin, Plavix Dig level 0.6 on admission  #4 essential hypertension blood pressure running soft antihypertensives on hold   #5 hyperlipidemia continue statin  #6 type 2 diabetes on metformin 500 twice daily hold for diuresis cr trending up A1C 7.1 CBG (last 3)  Recent Labs    08/25/23 2104 08/26/23 0740 08/26/23 1118  GLUCAP 123* 91 121*   #7 overweight BMI 26 complicates overall prognosis and outcome  #8 Prolonged QTC-533 erythomycin decreased to 3 days Avoid qt prolonging agents    Estimated body mass index is 27.05 kg/m as calculated from the following:   Height as of this encounter: 6\' 1"  (1.854 m).   Weight as of this encounter: 93 kg.  DVT prophylaxis: lovenox Code Status: full Family Communication: none Disposition Plan:  Status is: IP    Consultants:  none  Procedures:none Antimicrobials:rocephin azithro  Subjective: Bp soft since yest Continues to have a cough No swelling objective: Vitals:   08/26/23 0432 08/26/23 0433 08/26/23 0910 08/26/23 0958  BP: 95/75  (!) 87/62   Pulse: 84  86   Resp: 20     Temp: 98.6 F (37 C)     TempSrc: Oral     SpO2: 100%   98%  Weight:  93 kg    Height:        Intake/Output Summary (Last 24 hours) at 08/26/2023 1411 Last data filed at 08/26/2023 0200 Gross per 24 hour  Intake 100 ml  Output --  Net 100 ml   Filed Weights   08/24/23 0500 08/25/23 0432 08/26/23 0433  Weight: 91.2 kg 90.8 kg 93 kg    Examination:  General exam: Appears in no acute distress Respiratory system: Ronchi to auscultation. Respiratory effort normal. Cardiovascular system: S1 & S2 heard, RRR.  Gastrointestinal system: Abdomen is nondistended, soft and nontender. No  organomegaly or masses felt. Normal bowel sounds heard. Central nervous system: Alert and oriented. No focal neurological deficits. Extremities: No edema  Data Reviewed: I have personally reviewed following labs and imaging studies  CBC: Recent Labs  Lab 08/23/23 1313 08/24/23 0645  WBC 7.8 7.1  HGB 10.6* 10.1*  HCT 34.0* 32.7*  MCV 90.7 92.6  PLT 217 193   Basic Metabolic Panel: Recent Labs  Lab 08/23/23 1313 08/23/23 2011 08/24/23 0645 08/25/23 1210 08/26/23 0501  NA 139  --  139 136 136  K 3.6  --  3.8 4.2 3.8  CL 103  --  102 100 100  CO2 26  --  26 27 25   GLUCOSE 106*  --  113* 113* 103*  BUN 14  --  16 18 21   CREATININE 0.97  --  1.09* 1.05* 1.13*  CALCIUM 9.0  --  8.7* 8.8* 8.4*  MG  --  1.9  --   --   --   PHOS  --  3.6  --   --   --    GFR: Estimated Creatinine Clearance: 68 mL/min (A) (by C-G formula based on SCr of 1.13 mg/dL (H)). Liver Function Tests: Recent Labs  Lab 08/23/23 1313 08/24/23 0645  AST 18 19  ALT 19 22  ALKPHOS 44 44  BILITOT 1.1 1.2  PROT 7.6 7.1  ALBUMIN 4.0 3.5   No results for input(s): "LIPASE", "AMYLASE" in the last 168 hours. No results for input(s): "AMMONIA" in the last 168 hours. Coagulation Profile: Recent Labs  Lab 08/24/23 0645  INR 1.3*   Cardiac Enzymes: No results for input(s): "CKTOTAL", "CKMB", "CKMBINDEX", "TROPONINI" in the last 168 hours. BNP (last 3 results) No results for input(s): "PROBNP" in the last 8760 hours. HbA1C: No results for input(s): "HGBA1C" in the last 72 hours. CBG: Recent Labs  Lab 08/25/23 1137 08/25/23 1712 08/25/23 2104 08/26/23 0740 08/26/23 1118  GLUCAP 122* 93 123* 91 121*   Lipid Profile: No results for input(s): "CHOL", "HDL", "LDLCALC", "TRIG", "CHOLHDL", "LDLDIRECT" in the last 72 hours. Thyroid Function Tests: No results for input(s): "TSH", "T4TOTAL", "FREET4", "T3FREE", "THYROIDAB" in the last 72 hours. Anemia Panel: No results for input(s): "VITAMINB12",  "FOLATE", "FERRITIN", "TIBC", "IRON", "RETICCTPCT" in the last 72 hours. Sepsis Labs: Recent Labs  Lab 08/23/23 2011 08/24/23 0645 08/26/23 1156  PROCALCITON <0.10 <0.10  --   LATICACIDVEN  --   --  0.9    Recent Results (from the past 240 hours)  Resp panel by RT-PCR (RSV, Flu A&B, Covid) Anterior Nasal Swab     Status: None   Collection Time: 08/23/23  1:13 PM   Specimen: Anterior Nasal Swab  Result Value Ref Range Status   SARS Coronavirus 2 by RT PCR NEGATIVE NEGATIVE Final    Comment: (NOTE) SARS-CoV-2 target nucleic acids are NOT DETECTED.  The SARS-CoV-2 RNA is generally detectable in upper respiratory specimens during the acute phase of infection. The lowest concentration of SARS-CoV-2 viral copies this assay can detect is 138 copies/mL. A negative result does not preclude SARS-Cov-2 infection and should not be used as the sole basis for treatment  or other patient management decisions. A negative result may occur with  improper specimen collection/handling, submission of specimen other than nasopharyngeal swab, presence of viral mutation(s) within the areas targeted by this assay, and inadequate number of viral copies(<138 copies/mL). A negative result must be combined with clinical observations, patient history, and epidemiological information. The expected result is Negative.  Fact Sheet for Patients:  BloggerCourse.com  Fact Sheet for Healthcare Providers:  SeriousBroker.it  This test is no t yet approved or cleared by the [place] FDA and  has been authorized for detection and/or diagnosis of SARS-CoV-2 by FDA under an Emergency Use Authorization (EUA). This EUA will remain  in effect (meaning this test can be used) for the duration of the COVID-19 declaration under Section 564(b)(1) of the Act, 21 U.S.C.section 360bbb-3(b)(1), unless the authorization is terminated  or revoked sooner.       Influenza A  by PCR NEGATIVE NEGATIVE Final   Influenza B by PCR NEGATIVE NEGATIVE Final    Comment: (NOTE) The Xpert Xpress SARS-CoV-2/FLU/RSV plus assay is intended as an aid in the diagnosis of influenza from Nasopharyngeal swab specimens and should not be used as a sole basis for treatment. Nasal washings and aspirates are unacceptable for Xpert Xpress SARS-CoV-2/FLU/RSV testing.  Fact Sheet for Patients: BloggerCourse.com  Fact Sheet for Healthcare Providers: SeriousBroker.it  This test is not yet approved or cleared by the [place] FDA and has been authorized for detection and/or diagnosis of SARS-CoV-2 by FDA under an Emergency Use Authorization (EUA). This EUA will remain in effect (meaning this test can be used) for the duration of the COVID-19 declaration under Section 564(b)(1) of the Act, 21 U.S.C. section 360bbb-3(b)(1), unless the authorization is terminated or revoked.     Resp Syncytial Virus by PCR NEGATIVE NEGATIVE Final    Comment: (NOTE) Fact Sheet for Patients: BloggerCourse.com  Fact Sheet for Healthcare Providers: SeriousBroker.it  This test is not yet approved or cleared by the [place] FDA and has been authorized for detection and/or diagnosis of SARS-CoV-2 by FDA under an Emergency Use Authorization (EUA). This EUA will remain in effect (meaning this test can be used) for the duration of the COVID-19 declaration under Section 564(b)(1) of the Act, 21 U.S.C. section 360bbb-3(b)(1), unless the authorization is terminated or revoked.  Performed at [facility], [address] [place], [place] [address]   Urine Culture     Status: Abnormal   Collection Time: 08/23/23  3:45 PM   Specimen: Urine, Clean Catch  Result Value Ref Range Status   Specimen Description   Final    URINE, CLEAN CATCH Performed at [facility],  [address], [place], [place] [address]    Special Requests   Final    NONE Performed at [facility], [address], [place], [place] [address]    Culture >=100,000 COLONIES/mL KLEBSIELLA PNEUMONIAE (A)  Final   Report Status 08/26/2023 FINAL  Final   Organism ID, Bacteria KLEBSIELLA PNEUMONIAE (A)  Final      Susceptibility   Klebsiella pneumoniae - MIC*    AMPICILLIN >=32 RESISTANT Resistant     CEFAZOLIN <=4 SENSITIVE Sensitive     CEFEPIME <=0.12 SENSITIVE Sensitive     CEFTRIAXONE <=0.25 SENSITIVE Sensitive     CIPROFLOXACIN <=0.25 SENSITIVE Sensitive     GENTAMICIN <=1 SENSITIVE Sensitive     IMIPENEM 0.5 SENSITIVE Sensitive     NITROFURANTOIN 32 SENSITIVE Sensitive     TRIMETH/SULFA <=20  SENSITIVE Sensitive     AMPICILLIN/SULBACTAM 16 INTERMEDIATE Intermediate     PIP/TAZO 8 SENSITIVE Sensitive ug/mL    * >=100,000 COLONIES/mL KLEBSIELLA PNEUMONIAE         Radiology Studies: ECHOCARDIOGRAM COMPLETE Result Date: 08/25/2023    ECHOCARDIOGRAM REPORT   Patient Name:   Lynn Estrada Date of Exam: 08/25/2023 Medical Rec #:  [id number]         Height:       73.0 in Accession #:    [id number]        Weight:       200.2 lb Date of Birth:  March 25, 1961        BSA:          2.153 m Patient Age:    62 years          BP:           95/66 mmHg Patient Gender: F                 HR:           96 bpm. Exam Location:  Inpatient Procedure: 2D Echo, Color Doppler and Cardiac Doppler (Both Spectral and Color            Flow Doppler were utilized during procedure). Indications:    Abnormal ECG R94.31  History:        Patient has prior history of Echocardiogram examinations, most                 recent 05/25/2023. Arrythmias:LBBB; Risk Factors:Hypertension                 and Diabetes.  Sonographer:    Webb Laws Referring Phys: [id number] Kylen Ismael G Georgeann Brinkman IMPRESSIONS  1. Left ventricular ejection fraction, by estimation, is 20 to 25%. The left ventricle has severely  decreased function. The left ventricle demonstrates global hypokinesis. The left ventricular internal cavity size was severely dilated. Left ventricular diastolic parameters are consistent with Grade II diastolic dysfunction (pseudonormalization). Elevated left atrial pressure.  2. Right ventricular systolic function is normal. The right ventricular size is normal. Tricuspid regurgitation signal is inadequate for assessing PA pressure.  3. Left atrial size was moderately dilated.  4. The mitral valve is normal in structure. Trivial mitral valve regurgitation. No evidence of mitral stenosis.  5. The aortic valve is tricuspid. Aortic valve regurgitation is not visualized. No aortic stenosis is present.  6. The inferior vena cava is normal in size with greater than 50% respiratory variability, suggesting right atrial pressure of 3 mmHg. Comparison(s): Prior images reviewed side by side. The left ventricular function is unchanged. The left ventricular diastolic function is significantly worse. FINDINGS  Left Ventricle: Left ventricular ejection fraction, by estimation, is 20 to 25%. The left ventricle has severely decreased function. The left ventricle demonstrates global hypokinesis. Strain imaging was not performed. The left ventricular internal cavity size was severely dilated. There is no left ventricular hypertrophy. Abnormal (paradoxical) septal motion, consistent with left bundle branch block. Left ventricular diastolic parameters are consistent with Grade II diastolic dysfunction (pseudonormalization). Elevated left atrial pressure. Right Ventricle: The right ventricular size is normal. No increase in right ventricular wall thickness. Right ventricular systolic function is normal. Tricuspid regurgitation signal is inadequate for assessing PA pressure. Left Atrium: Left atrial size was moderately dilated. Right Atrium: Right atrial size was normal in size. Pericardium: Trivial pericardial effusion is present.  Mitral Valve: The mitral valve is normal in structure. Trivial mitral valve  regurgitation. No evidence of mitral valve stenosis. Tricuspid Valve: The tricuspid valve is normal in structure. Tricuspid valve regurgitation is not demonstrated. Aortic Valve: The aortic valve is tricuspid. Aortic valve regurgitation is not visualized. No aortic stenosis is present. Pulmonic Valve: The pulmonic valve was grossly normal. Pulmonic valve regurgitation is not visualized. No evidence of pulmonic stenosis. Aorta: The aortic root and ascending aorta are structurally normal, with no evidence of dilitation. Venous: The inferior vena cava is normal in size with greater than 50% respiratory variability, suggesting right atrial pressure of 3 mmHg. IAS/Shunts: No atrial level shunt detected by color flow Doppler. Additional Comments: 3D imaging was not performed.  LEFT VENTRICLE PLAX 2D LVIDd:         7.20 cm   Diastology LVIDs:         6.50 cm   LV e' medial:    5.44 cm/s LV PW:         1.10 cm   LV E/e' medial:  23.0 LV IVS:        0.90 cm   LV e' lateral:   3.26 cm/s LVOT diam:     2.30 cm   LV E/e' lateral: 38.3 LV SV:         97 LV SV Index:   45 LVOT Area:     4.15 cm  RIGHT VENTRICLE             IVC RV Basal diam:  2.90 cm     IVC diam: 2.00 cm RV S prime:     11.90 cm/s TAPSE (M-mode): 2.5 cm LEFT ATRIUM             Index        RIGHT ATRIUM           Index LA diam:        4.90 cm 2.28 cm/m   RA Area:     11.40 cm LA Vol (A2C):   71.3 ml 33.12 ml/m  RA Volume:   25.40 ml  11.80 ml/m LA Vol (A4C):   51.7 ml 24.02 ml/m LA Biplane Vol: 66.6 ml 30.94 ml/m  AORTIC VALVE LVOT Vmax:   146.00 cm/s LVOT Vmean:  89.900 cm/s LVOT VTI:    0.234 m  AORTA Ao Root diam: 3.00 cm Ao Asc diam:  3.50 cm MITRAL VALVE MV Area (PHT): 7.66 cm     SHUNTS MV Decel Time: 99 msec      Systemic VTI:  0.23 m MV E velocity: 125.00 cm/s  Systemic Diam: 2.30 cm MV A velocity: 93.30 cm/s MV E/A ratio:  1.34 Mihai Croitoru MD Electronically signed by  Thurmon Fair MD Signature Date/Time: 08/25/2023/1:38:57 PM    Final    Scheduled Meds:  atorvastatin  10 mg Oral Daily   budesonide (PULMICORT) nebulizer solution  0.5 mg Nebulization BID   clopidogrel  75 mg Oral Daily   digoxin  125 mcg Oral Daily   feeding supplement  237 mL Oral BID BM   guaiFENesin  600 mg Oral BID   insulin aspart  0-15 Units Subcutaneous TID WC   Continuous Infusions:  cefTRIAXone (ROCEPHIN)  IV 2 g (08/25/23 1504)    LOS: 2 days   Alwyn Ren, MD  08/26/2023, 2:11 PM

## 2023-08-26 NOTE — Plan of Care (Signed)
  Problem: Education: Goal: Knowledge of General Education information will improve Description: Including pain rating scale, medication(s)/side effects and non-pharmacologic comfort measures Outcome: Progressing   Problem: Health Behavior/Discharge Planning: Goal: Ability to manage health-related needs will improve Outcome: Progressing   Problem: Clinical Measurements: Goal: Ability to maintain clinical measurements within normal limits will improve Outcome: Progressing Goal: Will remain free from infection Outcome: Progressing Goal: Diagnostic test results will improve Outcome: Progressing Goal: Respiratory complications will improve Outcome: Progressing Goal: Cardiovascular complication will be avoided Outcome: Progressing   Problem: Activity: Goal: Risk for activity intolerance will decrease Outcome: Progressing   Problem: Nutrition: Goal: Adequate nutrition will be maintained Outcome: Progressing   Problem: Coping: Goal: Level of anxiety will decrease Outcome: Progressing   Problem: Elimination: Goal: Will not experience complications related to bowel motility Outcome: Progressing Goal: Will not experience complications related to urinary retention Outcome: Progressing   Problem: Pain Managment: Goal: General experience of comfort will improve and/or be controlled Outcome: Progressing   Problem: Safety: Goal: Ability to remain free from injury will improve Outcome: Progressing   Problem: Skin Integrity: Goal: Risk for impaired skin integrity will decrease Outcome: Progressing   Problem: Education: Goal: Ability to describe self-care measures that may prevent or decrease complications (Diabetes Survival Skills Education) will improve Outcome: Progressing Goal: Individualized Educational Video(s) Outcome: Progressing   Problem: Coping: Goal: Ability to adjust to condition or change in health will improve Outcome: Progressing   Problem: Fluid  Volume: Goal: Ability to maintain a balanced intake and output will improve Outcome: Progressing   Problem: Health Behavior/Discharge Planning: Goal: Ability to identify and utilize available resources and services will improve Outcome: Progressing Goal: Ability to manage health-related needs will improve Outcome: Progressing   Problem: Metabolic: Goal: Ability to maintain appropriate glucose levels will improve Outcome: Progressing   Problem: Nutritional: Goal: Maintenance of adequate nutrition will improve Outcome: Progressing Goal: Progress toward achieving an optimal weight will improve Outcome: Progressing   Problem: Skin Integrity: Goal: Risk for impaired skin integrity will decrease Outcome: Progressing   Problem: Tissue Perfusion: Goal: Adequacy of tissue perfusion will improve Outcome: Progressing   Problem: Education: Goal: Ability to demonstrate management of disease process will improve Outcome: Progressing Goal: Ability to verbalize understanding of medication therapies will improve Outcome: Progressing Goal: Individualized Educational Video(s) Outcome: Progressing   Problem: Activity: Goal: Capacity to carry out activities will improve Outcome: Progressing   Problem: Cardiac: Goal: Ability to achieve and maintain adequate cardiopulmonary perfusion will improve Outcome: Progressing   Problem: Activity: Goal: Ability to tolerate increased activity will improve Outcome: Progressing   Problem: Clinical Measurements: Goal: Ability to maintain a body temperature in the normal range will improve Outcome: Progressing   Problem: Respiratory: Goal: Ability to maintain adequate ventilation will improve Outcome: Progressing Goal: Ability to maintain a clear airway will improve Outcome: Progressing

## 2023-08-26 NOTE — Consult Note (Signed)
 Cardiology Consultation   Patient ID: Lynn Estrada MRN: [id number]; DOB: 06-30-1961  Admit date: 08/23/2023 Date of Consult: 08/26/2023  PCP:  Porfirio Oar, PA   [facility] Providers Cardiologist:  None  Electrophysiologist:  Maurice Small, MD       Patient Profile:   Lynn Estrada is a 63 y.o. female with a hx of combined systolic and diastolic CHF, hypertension, GERD, nephrolithiasis, partial right nephrectomy, LBBB who is being seen 08/26/2023 for HF GDMT recommendations at the request of Dr. Jerolyn Center.  History of Present Illness:   Lynn Estrada presented to the ED on 2/21 with symptoms of cough productive of blood tinged sputum, chest discomfort with coughing, fatigue, headache. She also reported fever, chills, wheezing, nausea, emesis, limited oral intake. UA concerning for UTI with moderate leukocyte esterase, elevated WBCs, many bacteria. RVP negative. BNP 1039.6. CXR with new right infrahilar heterogeneous airspace opacification and probable similar heterogenous airspace opacity overlying left lower lung. Imaging suspicious for right lower lobe pneumonia vs multifocal pneumonia.   On exam this morning, patient continues with cough and small amount of blood tinged sputum. She expresses concern about her reduced LVEF on echocardiogram. Discussed symptoms prior to admission with patient agreeing with above. No obvious volume overload and patient feels that she has been losing weight. No orthopnea/LE edema reported.   Cardiac history notable for an admission in January of 2023, diagnosed with acute HFrEF. 07/13/21 Echo 20-25%, RV normal, grade II DD , and septal - lateral dyssnchrony. R/L cath w/ Minimal CAD, mildly elevated filling pressures and normal CO. cMRI with possible myocarditis. She was started on Jardiance and Spironolactone at that time, no BB or ARNI due to hypotension. Patient had CRT-D placed 01/14/23, good results with narrowed QRS. Admitted in  11/24 with stroke like symptoms (aphasia) CTA head a showed a possible small distal ACA occlusion.  CT head was negative for stroke. 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 Echo 11/24 with LVEF 20-25%. Last seen in HF clinic a month ago, was taking Digoxin 0.125mg  daily, Spironolactone 25mg  daily, Entresto 24-26mg  BID, Jardiance 10mg . Bisoprolol 2.5mg  daily added.    Past Medical History:  Diagnosis Date   Anemia    Arthritis    CHF (congestive heart failure) (HCC) 12/06/2022   EF is 20% to 25 %   COPD (chronic obstructive pulmonary disease) (HCC)    Diabetes mellitus without complication (HCC)    Eczema    GERD (gastroesophageal reflux disease)    History of adenomatous polyp of colon    08-03-2016  tubular adenoma x2 and hyperplastic   History of chronic gastritis 08/03/2016   History of ectopic pregnancy 1988   s/p  left salpingectomy   History of endometriosis    History of kidney stones    History of partial nephrectomy    right pelvis for very large stone   History of pneumonia 07/02/2016   CAP   History of sepsis    07-01-2016  sepsis w/ pyelonephritis, CAP /   12-31-2016  urosepsis w/ kidney stone obstruction   History of uterine leiomyoma    Hx of adenomatous colonic polyps 08/03/2016   2 adenomas, 1 hpp and 1 lost polyp 2018 all < 1 cm    Hyperlipidemia    Hypertension    Left ureteral stone    Myocardial infarction (HCC)    Nephrolithiasis    left obstructive stone and right non-obstructive  stone  per CT 12-31-2016   Urgency of urination     Past Surgical History:  Procedure Laterality Date   ABDOMINAL HYSTERECTOMY  01/20/2007   w/ Lysis Adhesions/  Left salpingoophorectomy/  Right Salpingectomy   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   Procedure: BIV ICD INSERTION CRT-D;  Surgeon: Maurice Small, MD;  Location: [facility] INVASIVE CV LAB;  Service: Cardiovascular;  Laterality: N/A;   CHOLECYSTECTOMY   1994   and Right Partial Nephrectomy (pelvis for very large stone)   COLONOSCOPY WITH PROPOFOL N/A 03/26/2023   Procedure: COLONOSCOPY WITH PROPOFOL;  Surgeon: Iva Boop, MD;  Location: [facility];  Service: Gastroenterology;  Laterality: N/A;   CYSTOSCOPY W/ URETERAL STENT PLACEMENT Left 12/31/2016   Procedure: CYSTOSCOPY WITH RETROGRADE PYELOGRAM/ LEFT URETERAL STENT PLACEMENT;  Surgeon: Sebastian Ache, MD;  Location: [facility];  Service: Urology;  Laterality: Left;   CYSTOSCOPY WITH RETROGRADE PYELOGRAM, URETEROSCOPY AND STENT PLACEMENT Left 01/18/2017   Procedure: 1ST STAGE CYSTOSCOPY WITH RETROGRADE PYELOGRAM, URETEROSCOPY AND STENT REPLACEMENT;  Surgeon: Sebastian Ache, MD;  Location: [facility];  Service: Urology;  Laterality: Left;   CYSTOSCOPY WITH RETROGRADE PYELOGRAM, URETEROSCOPY AND STENT PLACEMENT Left 02/01/2017   Procedure: 2ND STAGE CYSTOSCOPY WITH RETROGRADE PYELOGRAM, URETEROSCOPY AND STENT REPLACEMENT;  Surgeon: Sebastian Ache, MD;  Location: [facility];  Service: Urology;  Laterality: Left;   ECTOPIC PREGNANCY SURGERY  1988   Left Salpingectomy   HOLMIUM LASER APPLICATION Left 01/18/2017   Procedure: HOLMIUM LASER APPLICATION;  Surgeon: Sebastian Ache, MD;  Location: [facility];  Service: Urology;  Laterality: Left;   HOLMIUM LASER APPLICATION Left 02/01/2017   Procedure: HOLMIUM LASER APPLICATION;  Surgeon: Sebastian Ache, MD;  Location: [facility];  Service: Urology;  Laterality: Left;   LUMBAR DISC SURGERY  01/1999   right L5 -- S1   POLYPECTOMY  03/26/2023   Procedure: POLYPECTOMY;  Surgeon: Iva Boop, MD;  Location: [facility];  Service: Gastroenterology;;   RE-EXPLORATION LUMBAR/  LAMINECTOMY AND MICRODISKECTOMY  10/14/2001   right L5 -- S1   RIGHT HEART CATH N/A 05/23/2022   Procedure: RIGHT HEART CATH;  Surgeon: Dolores Patty, MD;  Location: [facility] INVASIVE CV LAB;  Service: Cardiovascular;   Laterality: N/A;   RIGHT/LEFT HEART CATH AND CORONARY ANGIOGRAPHY N/A 07/18/2021   Procedure: RIGHT/LEFT HEART CATH AND CORONARY ANGIOGRAPHY;  Surgeon: Dolores Patty, MD;  Location: [facility] INVASIVE CV LAB;  Service: Cardiovascular;  Laterality: N/A;   TUBAL LIGATION Bilateral 10/17/1999   PPTL   UMBILICAL HERNIA REPAIR  age 76     Home Medications:  Prior to Admission medications   Medication Sig Start Date End Date Taking? Authorizing Provider  acetaminophen (TYLENOL) 500 MG tablet Take 500-1,000 mg by mouth every 6 (six) hours as needed (for pain.).   Yes [provider]  aspirin EC 81 MG tablet Take 81 mg by mouth daily. Swallow whole.   Yes [provider]  atorvastatin (LIPITOR) 10 MG tablet Take 10 mg by mouth in the morning. 05/15/21  Yes [provider]  bisoprolol (ZEBETA) 5 MG tablet Take 0.5 tablets (2.5 mg total) by mouth daily. Patient taking differently: Take 2.5 mg by mouth in the morning. 07/19/23  Yes Bensimhon, Bevelyn Buckles, MD  calcium carbonate (TUMS - DOSED IN MG ELEMENTAL CALCIUM) 500 MG chewable tablet Chew 1 tablet (200 mg of elemental calcium total) by mouth 2 (two) times daily with a meal. 05/27/23  Yes Tawkaliyar,  Roya, DO  clopidogrel (PLAVIX) 75 MG tablet Take 1 tablet (75 mg total) by mouth daily. Patient taking differently: Take 75 mg by mouth in the morning. 06/24/23 06/18/24 Yes Butch Penny, NP  digoxin (LANOXIN) 0.125 MG tablet TAKE 1 TABLET BY MOUTH EVERY DAY Patient taking differently: Take 125 mcg by mouth in the morning. 03/12/23  Yes Bensimhon, Bevelyn Buckles, MD  empagliflozin (JARDIANCE) 10 MG TABS tablet Take 1 tablet (10 mg total) by mouth daily. Patient taking differently: Take 10 mg by mouth in the morning. 06/03/23  Yes Tawkaliyar, Roya, DO  furosemide (LASIX) 40 MG tablet Take 40 mg by mouth in the morning.   Yes [provider]  metFORMIN (GLUCOPHAGE) 500 MG tablet Take 1 tablet (500 mg total) by mouth 2 (two) times  daily with a meal. 05/27/23 08/23/23 Yes Tawkaliyar, Roya, DO  Multiple Vitamin (MULTIVITAMIN WITH MINERALS) TABS tablet Take 1 tablet by mouth in the morning. One A Day for Women 50+   Yes [provider]  ondansetron (ZOFRAN-ODT) 4 MG disintegrating tablet Take 1 tablet (4 mg total) by mouth every 8 (eight) hours as needed for nausea or vomiting. 08/04/23  Yes Kommor, Madison, MD  sacubitril-valsartan (ENTRESTO) 24-26 MG TAKE 1 TABLET BY MOUTH TWICE A DAY 06/05/23  Yes Bensimhon, Bevelyn Buckles, MD    Inpatient Medications: Scheduled Meds:  atorvastatin  10 mg Oral Daily   bisoprolol  2.5 mg Oral Daily   budesonide (PULMICORT) nebulizer solution  0.5 mg Nebulization BID   clopidogrel  75 mg Oral Daily   digoxin  125 mcg Oral Daily   feeding supplement  237 mL Oral BID BM   furosemide  20 mg Intravenous BID   guaiFENesin  600 mg Oral BID   insulin aspart  0-15 Units Subcutaneous TID WC   metFORMIN  500 mg Oral BID WC   potassium chloride  40 mEq Oral Daily   Continuous Infusions:  cefTRIAXone (ROCEPHIN)  IV 2 g (08/25/23 1504)   PRN Meds: acetaminophen **OR** acetaminophen, albuterol, guaiFENesin-dextromethorphan, ondansetron **OR** ondansetron (ZOFRAN) IV, oxyCODONE  Allergies:    Allergies  Allergen Reactions   Latex Rash and Hives   Bactrim [Sulfamethoxazole-Trimethoprim] Rash   Carvedilol Itching   Metoprolol Itching and Rash    Social History:   Social History   Socioeconomic History   Marital status: Married    Spouse name: Richard Resetar   Number of children: 1   Years of education: college   Highest education level: Not on file  Occupational History   Occupation: Product manager  Tobacco Use   Smoking status: Former    Current packs/day: 0.00    Average packs/day: 0.8 packs/day for 5.0 years (3.8 ttl pk-yrs)    Types: Cigarettes    Start date: 01/11/1992    Quit date: 01/10/1997    Years since quitting: 26.6   Smokeless tobacco: Never  Vaping Use    Vaping status: Never Used  Substance and Sexual Activity   Alcohol use: Not Currently    Comment: social use; once a month   Drug use: No   Sexual activity: Yes    Partners: Male    Birth control/protection: Surgical  Other Topics Concern   Not on file  Social History Narrative   Lives with her husband and their daughter and their dog.   Social Drivers of Corporate investment banker Strain: Low Risk  (08/20/2022)   Received from [facility], [facility]   Overall Financial Resource Strain (  CARDIA)    Difficulty of Paying Living Expenses: Not very hard  Food Insecurity: No Food Insecurity (08/24/2023)   Hunger Vital Sign    Worried About Running Out of Food in the Last Year: Never true    Ran Out of Food in the Last Year: Never true  Transportation Needs: No Transportation Needs (08/24/2023)   PRAPARE - Administrator, Civil Service (Medical): No    Lack of Transportation (Non-Medical): No  Physical Activity: Insufficiently Active (08/20/2022)   Received from [facility], [facility]   Exercise Vital Sign    Days of Exercise per Week: 3 days    Minutes of Exercise per Session: 30 min  Stress: No Stress Concern Present (08/20/2022)   Received from [facility], [facility] - Occupational Stress Questionnaire    Feeling of Stress : Only a Schillo  Social Connections: Socially Integrated (08/20/2022)   Received from [facility], [facility]   Social Network    How would you rate your social network (family, work, friends)?: Good participation with social networks  Intimate Partner Violence: Not At Risk (08/24/2023)   Humiliation, Afraid, Rape, and Kick questionnaire    Fear of Current or Ex-Partner: No    Emotionally Abused: No    Physically Abused: No    Sexually Abused: No    Family History:    Family History  Problem Relation Age of Onset   Sarcoidosis Mother    Prostate cancer Father    Hypertension  Sister    Eczema Sister    Lupus Sister    Breast cancer Maternal Aunt    Gout Maternal Grandmother    Diabetes Maternal Grandmother        leg amputations   Stomach cancer Neg Hx    Colon cancer Neg Hx    Esophageal cancer Neg Hx    Rectal cancer Neg Hx      ROS:  Please see the history of present illness.   All other ROS reviewed and negative.     Physical Exam/Data:   Vitals:   08/26/23 0432 08/26/23 0433 08/26/23 0910 08/26/23 0958  BP: 95/75  (!) 87/62   Pulse: 84  86   Resp: 20     Temp: 98.6 F (37 C)     TempSrc: Oral     SpO2: 100%   98%  Weight:  93 kg    Height:        Intake/Output Summary (Last 24 hours) at 08/26/2023 1051 Last data filed at 08/26/2023 0200 Gross per 24 hour  Intake 100 ml  Output --  Net 100 ml      08/26/2023    4:33 AM 08/25/2023    4:32 AM 08/24/2023    5:00 AM  Last 3 Weights  Weight (lbs) 205 lb 0.4 oz 200 lb 3.2 oz 201 lb  Weight (kg) 93 kg 90.81 kg 91.173 kg     Body mass index is 27.05 kg/m.  General:  Well nourished, well developed, in no acute distress HEENT: normal Neck: no JVD Vascular: No carotid bruits; Distal pulses 2+ bilaterally Cardiac:  RRR; no murmur Lungs:  clear to auscultation bilaterally, no wheezing, rhonchi or rales  Abd: soft, nontender, no hepatomegaly  Ext: no edema Musculoskeletal:  No deformities, BUE and BLE strength normal and equal Skin: warm and dry  Neuro:  CNs 2-12 intact, no focal abnormalities noted Psych:  Normal affect   EKG:  The  EKG was personally reviewed and demonstrates:  A sensed/V paced. Stable QRS width Telemetry:  Telemetry was personally reviewed and demonstrates:  A sensed V paced pacing. HR 80s-low 100s.  Relevant CV Studies:  08/24/22 TTE  IMPRESSIONS     1. Left ventricular ejection fraction, by estimation, is 20 to 25%. The  left ventricle has severely decreased function. The left ventricle  demonstrates global hypokinesis. The left ventricular internal cavity  size  was severely dilated. Left ventricular  diastolic parameters are consistent with Grade II diastolic dysfunction  (pseudonormalization). Elevated left atrial pressure.   2. Right ventricular systolic function is normal. The right ventricular  size is normal. Tricuspid regurgitation signal is inadequate for assessing  PA pressure.   3. Left atrial size was moderately dilated.   4. The mitral valve is normal in structure. Trivial mitral valve  regurgitation. No evidence of mitral stenosis.   5. The aortic valve is tricuspid. Aortic valve regurgitation is not  visualized. No aortic stenosis is present.   6. The inferior vena cava is normal in size with greater than 50%  respiratory variability, suggesting right atrial pressure of 3 mmHg.   Comparison(s): Prior images reviewed side by side. The left ventricular  function is unchanged. The left ventricular diastolic function is  significantly worse.   FINDINGS   Left Ventricle: Left ventricular ejection fraction, by estimation, is 20  to 25%. The left ventricle has severely decreased function. The left  ventricle demonstrates global hypokinesis. Strain imaging was not  performed. The left ventricular internal  cavity size was severely dilated. There is no left ventricular  hypertrophy. Abnormal (paradoxical) septal motion, consistent with left  bundle branch block. Left ventricular diastolic parameters are consistent  with Grade II diastolic dysfunction  (pseudonormalization). Elevated left atrial pressure.   Right Ventricle: The right ventricular size is normal. No increase in  right ventricular wall thickness. Right ventricular systolic function is  normal. Tricuspid regurgitation signal is inadequate for assessing PA  pressure.   Left Atrium: Left atrial size was moderately dilated.   Right Atrium: Right atrial size was normal in size.   Pericardium: Trivial pericardial effusion is present.   Mitral Valve: The mitral valve  is normal in structure. Trivial mitral  valve regurgitation. No evidence of mitral valve stenosis.   Tricuspid Valve: The tricuspid valve is normal in structure. Tricuspid  valve regurgitation is not demonstrated.   Aortic Valve: The aortic valve is tricuspid. Aortic valve regurgitation is  not visualized. No aortic stenosis is present.   Pulmonic Valve: The pulmonic valve was grossly normal. Pulmonic valve  regurgitation is not visualized. No evidence of pulmonic stenosis.   Aorta: The aortic root and ascending aorta are structurally normal, with  no evidence of dilitation.   Venous: The inferior vena cava is normal in size with greater than 50%  respiratory variability, suggesting right atrial pressure of 3 mmHg.   IAS/Shunts: No atrial level shunt detected by color flow Doppler.   Additional Comments: 3D imaging was not performed.   05/24/23 TTE IMPRESSIONS     1. Left ventricular ejection fraction, by estimation, is 20 to 25%. The  left ventricle has severely decreased function. The left ventricle  demonstrates global hypokinesis. The left ventricular internal cavity size  was severely dilated. Left ventricular  diastolic parameters are consistent with Grade I diastolic dysfunction  (impaired relaxation). Elevated left ventricular end-diastolic pressure.   2. Right ventricular systolic function is normal. The right ventricular  size is normal.  Tricuspid regurgitation signal is inadequate for assessing  PA pressure.   3. The mitral valve is normal in structure. Trivial mitral valve  regurgitation. No evidence of mitral stenosis.   4. The aortic valve is tricuspid. Aortic valve regurgitation is mild. No  aortic stenosis is present.   5. The inferior vena cava is normal in size with greater than 50%  respiratory variability, suggesting right atrial pressure of 3 mmHg.   Comparison(s): No significant change from prior study.   FINDINGS   Left Ventricle: Left ventricular  ejection fraction, by estimation, is 20  to 25%. The left ventricle has severely decreased function. The left  ventricle demonstrates global hypokinesis. The left ventricular internal  cavity size was severely dilated.  There is no left ventricular hypertrophy. Left ventricular diastolic  parameters are consistent with Grade I diastolic dysfunction (impaired  relaxation). Elevated left ventricular end-diastolic pressure.   Right Ventricle: The right ventricular size is normal. No increase in  right ventricular wall thickness. Right ventricular systolic function is  normal. Tricuspid regurgitation signal is inadequate for assessing PA  pressure.   Left Atrium: Left atrial size was normal in size.   Right Atrium: Right atrial size was normal in size.   Pericardium: Trivial pericardial effusion is present.   Mitral Valve: The mitral valve is normal in structure. Trivial mitral  valve regurgitation. No evidence of mitral valve stenosis.   Tricuspid Valve: The tricuspid valve is normal in structure. Tricuspid  valve regurgitation is trivial. No evidence of tricuspid stenosis.   Aortic Valve: The aortic valve is tricuspid. Aortic valve regurgitation is  mild. No aortic stenosis is present. Aortic valve mean gradient measures  4.0 mmHg. Aortic valve peak gradient measures 8.0 mmHg. Aortic valve area,  by VTI measures 2.04 cm.   Pulmonic Valve: The pulmonic valve was normal in structure. Pulmonic valve  regurgitation is trivial. No evidence of pulmonic stenosis.   Aorta: The aortic root and ascending aorta are structurally normal, with  no evidence of dilitation.   Venous: The inferior vena cava is normal in size with greater than 50%  respiratory variability, suggesting right atrial pressure of 3 mmHg.   IAS/Shunts: No atrial level shunt detected by color flow Doppler.   Additional Comments: A device lead is visualized.   05/19/22 cMRI  FINDINGS: 1. Severely enlarged left  ventricular size, with LVEDD 75 mm, and LVEDVi 176 mL/m2.   Mild concentric increase in myocardial mass by indexed assessment, with intraventricular septal thickness of 8 mm, posterior wall thickness of 7 mm, and myocardial mass index of 94 g/m2.   Severely reduced left ventricular systolic function (LVEF =18 %). There is global hypokinesis.   Global longitudinal strain -4.4%. Strain curves and wall motion are suggestive of left bundle branch block morphology.   Left ventricular parametric mapping notable for T2 elevation in the subendocardial basal inferior, mid inferior, inferoseptal, anteroseptal, and anterior, & apical lateral (seen on T2STIR).   Diffusely elevated ECV signal (34-% to 44%) worst in basal septum 44%.   There is late gadolinium enhancement in the left ventricular myocardium: Small mid myocardial stripe basal lateral into mid lateral.   There is no LV thrombus.   2. Mildly dilated right ventricular size by volume with RVEDVI 93 mL/m2.   Normal right ventricular thickness.   Moderately reduced right ventricular systolic function (RVEF =33%). There is septal hypokinesis.   3.  Moderate dilated left atrium and right atrial size.   4. Normal size of the  aortic root, ascending aorta and pulmonary artery.   5. Valve assessment:   Aortic Valve: Tri-leaflet aortic valve with qualitatively trivial aortic regurgitation.   Pulmonic Valve:qualitatively trivial regurgitation with prominent motion artifact.   Tricuspid Valve: qualitatively trivial regurgitation.   Mitral Valve: Trivial regurgitation.   6.  Normal pericardium.  No pericardial effusion.   7. Grossly, no extracardiac findings. Recommended dedicated study if concerned for non-cardiac pathology   IMPRESSION: Severely reduced LV function.   Though modified [facility] Criteria for myocarditis is technically met, LGE and parametric mapping are not classic for this and clinical correlation is  advised.    Laboratory Data:  High Sensitivity Troponin:  No results for input(s): "TROPONINIHS" in the last 720 hours.   Chemistry Recent Labs  Lab 08/23/23 2011 08/24/23 0645 08/25/23 1210 08/26/23 0501  NA  --  139 136 136  K  --  3.8 4.2 3.8  CL  --  102 100 100  CO2  --  26 27 25   GLUCOSE  --  113* 113* 103*  BUN  --  16 18 21   CREATININE  --  1.09* 1.05* 1.13*  CALCIUM  --  8.7* 8.8* 8.4*  MG 1.9  --   --   --   GFRNONAA  --  58* >60 55*  ANIONGAP  --  11 9 11     Recent Labs  Lab 08/23/23 1313 08/24/23 0645  PROT 7.6 7.1  ALBUMIN 4.0 3.5  AST 18 19  ALT 19 22  ALKPHOS 44 44  BILITOT 1.1 1.2   Lipids No results for input(s): "CHOL", "TRIG", "HDL", "LABVLDL", "LDLCALC", "CHOLHDL" in the last 168 hours.  Hematology Recent Labs  Lab 08/23/23 1313 08/24/23 0645  WBC 7.8 7.1  RBC 3.75* 3.53*  HGB 10.6* 10.1*  HCT 34.0* 32.7*  MCV 90.7 92.6  MCH 28.3 28.6  MCHC 31.2 30.9  RDW 13.8 14.1  PLT 217 193   Thyroid No results for input(s): "TSH", "FREET4" in the last 168 hours.  BNP Recent Labs  Lab 08/23/23 1312  BNP 1,039.6*    DDimer No results for input(s): "DDIMER" in the last 168 hours.   Radiology/Studies:  ECHOCARDIOGRAM COMPLETE Result Date: 08/25/2023    ECHOCARDIOGRAM REPORT   Patient Name:   GRADY LUCCI Date of Exam: 08/25/2023 Medical Rec #:  [id number]         Height:       73.0 in Accession #:    [id number]        Weight:       200.2 lb Date of Birth:  05/02/1961        BSA:          2.153 m Patient Age:    62 years          BP:           95/66 mmHg Patient Gender: F                 HR:           96 bpm. Exam Location:  Inpatient Procedure: 2D Echo, Color Doppler and Cardiac Doppler (Both Spectral and Color            Flow Doppler were utilized during procedure). Indications:    Abnormal ECG R94.31  History:        Patient has prior history of Echocardiogram examinations, most  recent 05/25/2023. Arrythmias:LBBB; Risk  Factors:Hypertension                 and Diabetes.  Sonographer:    Webb Laws Referring Phys: [id number] ELIZABETH G MATHEWS IMPRESSIONS  1. Left ventricular ejection fraction, by estimation, is 20 to 25%. The left ventricle has severely decreased function. The left ventricle demonstrates global hypokinesis. The left ventricular internal cavity size was severely dilated. Left ventricular diastolic parameters are consistent with Grade II diastolic dysfunction (pseudonormalization). Elevated left atrial pressure.  2. Right ventricular systolic function is normal. The right ventricular size is normal. Tricuspid regurgitation signal is inadequate for assessing PA pressure.  3. Left atrial size was moderately dilated.  4. The mitral valve is normal in structure. Trivial mitral valve regurgitation. No evidence of mitral stenosis.  5. The aortic valve is tricuspid. Aortic valve regurgitation is not visualized. No aortic stenosis is present.  6. The inferior vena cava is normal in size with greater than 50% respiratory variability, suggesting right atrial pressure of 3 mmHg. Comparison(s): Prior images reviewed side by side. The left ventricular function is unchanged. The left ventricular diastolic function is significantly worse. FINDINGS  Left Ventricle: Left ventricular ejection fraction, by estimation, is 20 to 25%. The left ventricle has severely decreased function. The left ventricle demonstrates global hypokinesis. Strain imaging was not performed. The left ventricular internal cavity size was severely dilated. There is no left ventricular hypertrophy. Abnormal (paradoxical) septal motion, consistent with left bundle branch block. Left ventricular diastolic parameters are consistent with Grade II diastolic dysfunction (pseudonormalization). Elevated left atrial pressure. Right Ventricle: The right ventricular size is normal. No increase in right ventricular wall thickness. Right ventricular systolic function is  normal. Tricuspid regurgitation signal is inadequate for assessing PA pressure. Left Atrium: Left atrial size was moderately dilated. Right Atrium: Right atrial size was normal in size. Pericardium: Trivial pericardial effusion is present. Mitral Valve: The mitral valve is normal in structure. Trivial mitral valve regurgitation. No evidence of mitral valve stenosis. Tricuspid Valve: The tricuspid valve is normal in structure. Tricuspid valve regurgitation is not demonstrated. Aortic Valve: The aortic valve is tricuspid. Aortic valve regurgitation is not visualized. No aortic stenosis is present. Pulmonic Valve: The pulmonic valve was grossly normal. Pulmonic valve regurgitation is not visualized. No evidence of pulmonic stenosis. Aorta: The aortic root and ascending aorta are structurally normal, with no evidence of dilitation. Venous: The inferior vena cava is normal in size with greater than 50% respiratory variability, suggesting right atrial pressure of 3 mmHg. IAS/Shunts: No atrial level shunt detected by color flow Doppler. Additional Comments: 3D imaging was not performed.  LEFT VENTRICLE PLAX 2D LVIDd:         7.20 cm   Diastology LVIDs:         6.50 cm   LV e' medial:    5.44 cm/s LV PW:         1.10 cm   LV E/e' medial:  23.0 LV IVS:        0.90 cm   LV e' lateral:   3.26 cm/s LVOT diam:     2.30 cm   LV E/e' lateral: 38.3 LV SV:         97 LV SV Index:   45 LVOT Area:     4.15 cm  RIGHT VENTRICLE             IVC RV Basal diam:  2.90 cm     IVC diam: 2.00 cm RV  S prime:     11.90 cm/s TAPSE (M-mode): 2.5 cm LEFT ATRIUM             Index        RIGHT ATRIUM           Index LA diam:        4.90 cm 2.28 cm/m   RA Area:     11.40 cm LA Vol (A2C):   71.3 ml 33.12 ml/m  RA Volume:   25.40 ml  11.80 ml/m LA Vol (A4C):   51.7 ml 24.02 ml/m LA Biplane Vol: 66.6 ml 30.94 ml/m  AORTIC VALVE LVOT Vmax:   146.00 cm/s LVOT Vmean:  89.900 cm/s LVOT VTI:    0.234 m  AORTA Ao Root diam: 3.00 cm Ao Asc diam:  3.50 cm  MITRAL VALVE MV Area (PHT): 7.66 cm     SHUNTS MV Decel Time: 99 msec      Systemic VTI:  0.23 m MV E velocity: 125.00 cm/s  Systemic Diam: 2.30 cm MV A velocity: 93.30 cm/s MV E/A ratio:  1.34 Mihai Croitoru MD Electronically signed by Thurmon Fair MD Signature Date/Time: 08/25/2023/1:38:57 PM    Final    DG Chest Portable 1 View Result Date: 08/23/2023 CLINICAL DATA:  Productive cough, fatigue, and headache. EXAM: PORTABLE CHEST 1 VIEW COMPARISON:  Chest radiograph 01/14/2023 and 05/14/2022 FINDINGS: Chest wall cardiac AICD is again seen with leads not significantly changed. Cardiac silhouette and mediastinal contours are unchanged and within normal limits with moderate calcification within the aortic arch. New right infrahilar heterogeneous airspace opacification. Probable similar heterogeneous airspace opacity overlying the left lower lungs/cardiac silhouette. No definite pleural effusion. No pneumothorax. Mild dextrocurvature of the midthoracic spine. Moderate multilevel degenerative disc and endplate changes. IMPRESSION: New right infrahilar heterogeneous airspace opacification and probable similar heterogeneous airspace opacity overlying the left lower lungs/cardiac silhouette. This is suspicious for at least right lower lung pneumonia and possible multifocal pneumonia. Electronically Signed   By: Neita Garnet M.D.   On: 08/23/2023 15:01     Assessment and Plan:   Acute on chronic combined systolic and diastolic HF, NICM Patient seen by AHF January 2023. 07/13/21 Echo 20-25%, RV normal, grade II DD , and septal -lateral dyssynchrony. 07/2021 cMRI possible myocarditis. LHC/RHC at that time with minimal CAD. RA 6 PCWP 21, CO 6.8/ CI 3.1. Patient then had small recovery of LVEF, Echo(4/23): EF 30-35%. EF then decreased again on 04/2022 TTE. S/p St. Jude Abbott CRT-D had narrowing of QRS, improvement in symptoms. Currently admitted with suspected pneumonia. Echo shows stable LVEF but worsened diastolic  dysfunction. BP low this admission, likely of mixed etiology secondary to poor oral intake and acute illness. She is euvolemic appearing on exam and with normal IVC size/normal RA pressure along with hypotension, recommend holding further diuresis. If fluid given back, would need to be done with extreme caution to avoid acute CHF. Hold home GDMT including Bisoprolol, Entresto, Jardiance (acute UTI treatment), Spironolactone.  Continue Digoxin Patient does not appear to be low output, is warm and well perfused but will check lactic acid.  Device interrogation requested to assess CorVue Patient will need close heart failure clinic follow up at discharge.   Hypertension BP low this admission. GDMT as above.  LBBB Patient s/p Abbott St Jude CRT-D. QRS width appears largely stable with recent ECGs.   CVA Hyperlipidemia Patient with CVA in November 2024. Continue Plavix and statin.   Pneumonia Per primary team.   UTI Per  primary team  DM type II Per primary team  Risk Assessment/Risk Scores:        New York Heart Association (NYHA) Functional Class NYHA Class II        For questions or updates, please contact [facility] Please consult [email or web address] for contact info under    Signed, Perlie Gold, PA-C  08/26/2023 10:51 AM

## 2023-08-26 NOTE — Progress Notes (Signed)
 Heart Failure Navigator Progress Note  Assessed for Heart & Vascular TOC clinic readiness.  Patient does not meet criteria due to Advanced Heart Failure Team patient of Dr. Gala Romney. .   Navigator will sign off at this time.   Rhae Hammock, BSN, Scientist, clinical (histocompatibility and immunogenetics) Only

## 2023-08-26 NOTE — TOC Initial Note (Signed)
 Transition of Care [facility]) - Initial/Assessment Note    Patient Details  Name: Lynn Estrada MRN: [id number] Date of Birth: [birth year]  [facility] of [facility] CM/SW Contact:    Lanier Clam, RN Phone Number: 08/26/2023, 4:13 PM  Clinical Narrative:  spoke to patient about d/c plans-prefer outpatient PT-Has own transport.                 Expected Discharge Plan: OP Rehab Barriers to Discharge: Continued Medical Work up   Patient Goals and CMS Choice Patient states their goals for this hospitalization and ongoing recovery are:: Home CMS Medicare.gov Compare Post Acute Care list provided to:: Patient Choice offered to / list presented to : Patient      Expected Discharge Plan and Services   Discharge Planning Services: CM Consult   Living arrangements for the past 2 months: Single Family Home                                      Prior Living Arrangements/Services Living arrangements for the past 2 months: Single Family Home Lives with:: Spouse Patient language and need for interpreter reviewed:: Yes Do you feel safe going back to the place where you live?: Yes      Need for Family Participation in Patient Care: Yes (Comment) Care giver support system in place?: Yes (comment) Current home services: DME (cane, crutches, walker) Criminal Activity/Legal Involvement Pertinent to Current Situation/Hospitalization: No - Comment as needed  Activities of Daily Living   ADL Screening (condition at time of admission) Independently performs ADLs?: No Does the patient have a NEW difficulty with bathing/dressing/toileting/self-feeding that is expected to last >3 days?: Yes (Initiates electronic notice to provider for possible OT consult) Does the patient have a NEW difficulty with getting in/out of bed, walking, or climbing stairs that is expected to last >3 days?: Yes (Initiates electronic notice to provider for possible PT consult) Does the patient have a NEW  difficulty with communication that is expected to last >3 days?: No Is the patient deaf or have difficulty hearing?: No Does the patient have difficulty seeing, even when wearing glasses/contacts?: No Does the patient have difficulty concentrating, remembering, or making decisions?: No  Permission Sought/Granted Permission sought to share information with : Case Manager Permission granted to share information with : Yes, Verbal Permission Granted  Share Information with NAME: Case manager     Permission granted to share info w Relationship: Lorijean Husser (spouse) [phone]     Emotional Assessment Appearance:: Appears stated age Attitude/Demeanor/Rapport: Gracious Affect (typically observed): Accepting Orientation: : Oriented to Self, Oriented to Place, Oriented to  Time, Oriented to Situation Alcohol / Substance Use: Not Applicable Psych Involvement: No (comment)  Admission diagnosis:  Acute on chronic combined systolic and diastolic heart failure (HCC) [I50.43] Acute cystitis without hematuria [N30.00] Pneumonia of both lower lobes due to infectious organism [J18.9] Acute on chronic congestive heart failure, unspecified heart failure type [facility]) [I50.9] Patient Active Problem List   Diagnosis Date Noted   Acute on chronic combined systolic and diastolic heart failure (HCC) 08/23/2023   Frequency of urination and polyuria 08/23/2023   Cerebrovascular accident (CVA) (HCC) 05/26/2023   Pyuria 05/25/2023   Left-sided weakness 05/24/2023   Epistaxis 04/22/2023   Benign neoplasm of cecum 03/26/2023   Benign neoplasm of descending colon 03/26/2023   Benign neoplasm of sigmoid colon 03/26/2023   Adhesive capsulitis of right shoulder 08/24/2022  Mass of right hand 02/22/2022   LBBB (left bundle branch block) 02/01/2022   Type 2 diabetes mellitus with hyperglycemia, without long-term current use of insulin (HCC) 01/22/2022   Hypertriglyceridemia 01/21/2022   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 10/26/2021   Chronic combined systolic and diastolic heart failure (HCC) 07/27/2021   Acute combined systolic (congestive) and diastolic (congestive) heart failure (HCC)    Hypocalcemia 07/12/2021   Elevated troponin 07/12/2021   Lipoma of hand 07/20/2020   Osteoarthritis of carpometacarpal (CMC) joint of thumb 07/20/2020   Pain in right hand 07/20/2020   Radial styloid tenosynovitis 07/20/2020   Other osteoarthritis of spine 10/23/2019   Suspected COVID-19 virus infection 06/11/2019   Acute respiratory failure with hypoxia (HCC) 06/11/2019   Multifocal pneumonia 06/11/2019   Carpal tunnel syndrome, bilateral 04/24/2019   Ureteral stone with hydronephrosis 12/31/2016   Hx of adenomatous colonic polyps 08/03/2016   Normocytic anemia 07/10/2016   CAP (community acquired pneumonia) 07/06/2016   Essential hypertension 07/26/2015   Eczema 07/26/2015   Overweight (BMI 25.0-29.9) 07/26/2015   PCP:  Porfirio Oar, PA Pharmacy:   [phone] [address] Otto, [place] - [address] [place] [address] Phone: [phone] Fax: [phone]     Social Drivers of Health (SDOH) Social History: SDOH Screenings   Food Insecurity: No Food Insecurity (08/24/2023)  Housing: Low Risk  (08/24/2023)  Transportation Needs: No Transportation Needs (08/24/2023)  Utilities: Not At Risk (08/24/2023)  Depression (PHQ2-9): Low Risk  (03/16/2022)  Financial Resource Strain: Low Risk  (08/20/2022)   Received from [facility], [facility]  Physical Activity: Insufficiently Active (08/20/2022)   Received from [facility], [facility]  Social Connections: Socially Integrated (08/20/2022)   Received from [facility], [facility]  Stress: No Stress Concern Present (08/20/2022)   Received from [facility], [facility]  Tobacco Use: Medium Risk (08/23/2023)   SDOH Interventions: Food Insecurity Interventions: Intervention  Not Indicated, Inpatient TOC Housing Interventions: Intervention Not Indicated, Inpatient TOC Transportation Interventions: Intervention Not Indicated, Inpatient TOC Utilities Interventions: Intervention Not Indicated, Inpatient TOC   Readmission Risk Interventions     No data to display

## 2023-08-26 NOTE — Progress Notes (Signed)
 Mobility Specialist - Progress Note   08/26/23 1030  Mobility  Activity Ambulated with assistance in hallway  Level of Assistance Modified independent, requires aide device or extra time  Assistive Device Front wheel walker  Distance Ambulated (ft) 100 ft  Range of Motion/Exercises Active  Activity Response Tolerated well  Mobility Referral Yes  Mobility visit 1 Mobility  Mobility Specialist Start Time (ACUTE ONLY) 1010  Mobility Specialist Stop Time (ACUTE ONLY) 1030  Mobility Specialist Time Calculation (min) (ACUTE ONLY) 20 min   Pt was found in bed and agreeable to ambulate. No complaints with session. At EOS returned to use bathroom. All needs met. RN notified.  Billey Chang Mobility Specialist

## 2023-08-27 DIAGNOSIS — I10 Essential (primary) hypertension: Secondary | ICD-10-CM | POA: Diagnosis not present

## 2023-08-27 LAB — GLUCOSE, CAPILLARY
Glucose-Capillary: 112 mg/dL — ABNORMAL HIGH (ref 70–99)
Glucose-Capillary: 140 mg/dL — ABNORMAL HIGH (ref 70–99)

## 2023-08-27 LAB — BASIC METABOLIC PANEL
Anion gap: 12 (ref 5–15)
BUN: 20 mg/dL (ref 8–23)
CO2: 23 mmol/L (ref 22–32)
Calcium: 8.9 mg/dL (ref 8.9–10.3)
Chloride: 104 mmol/L (ref 98–111)
Creatinine, Ser: 0.99 mg/dL (ref 0.44–1.00)
GFR, Estimated: >60 mL/min (ref >60)
Glucose, Bld: 90 mg/dL (ref 70–99)
Potassium: 4.4 mmol/L (ref 3.5–5.1)
Sodium: 139 mmol/L (ref 135–145)

## 2023-08-27 MED ORDER — AMOXICILLIN-POT CLAVULANATE 875-125 MG PO TABS: 1.0000 | ORAL_TABLET | Freq: Two times a day (BID) | ORAL | 0 refills | Status: DC

## 2023-08-27 MED ORDER — FUROSEMIDE 20 MG PO TABS: 20.0000 mg | ORAL_TABLET | Freq: Every day | ORAL | 2 refills | Status: DC

## 2023-08-27 MED ORDER — AMOXICILLIN-POT CLAVULANATE 875-125 MG PO TABS
1.0000 | ORAL_TABLET | Freq: Two times a day (BID) | ORAL | Status: DC
  Administered 2023-08-27: 1 via ORAL
  Filled 2023-08-27: qty 1

## 2023-08-27 NOTE — Progress Notes (Signed)
   Patient Name: Lynn Estrada Date of Encounter: 08/27/2023 [facility] Cardiologist: None   Interval Summary  .    Patient reports that she is feeling much better today with increased energy and improved breathing/cough.  Vital Signs .    Vitals:   08/26/23 1826 08/26/23 2114 08/27/23 0247 08/27/23 0500  BP:  99/72 103/73   Pulse:   89   Resp:  18 20   Temp:  (!) 100.5 F (38.1 C) 99 F (37.2 C)   TempSrc:  Oral    SpO2: 100% 100% 95%   Weight:    93.9 kg  Height:       No intake or output data in the 24 hours ending 08/27/23 0755    08/27/2023    5:00 AM 08/26/2023    4:33 AM 08/25/2023    4:32 AM  Last 3 Weights  Weight (lbs) 207 lb 0.2 oz 205 lb 0.4 oz 200 lb 3.2 oz  Weight (kg) 93.9 kg 93 kg 90.81 kg      Telemetry/ECG    BiV paced rhythm - Personally Reviewed  Physical Exam .   GEN: No acute distress.   Neck: No JVD Cardiac: RRR, no murmurs, rubs, or gallops.  Respiratory: Clear to auscultation bilaterally. GI: Soft, nontender, non-distended  MS: No edema  Assessment & Plan .    Jaisha Villacres Estrada is a 63 y.o. female with a hx of combined systolic and diastolic CHF, hypertension, GERD, nephrolithiasis, partial right nephrectomy, LBBB who is being seen for HF GDMT recommendations at the request of Dr. Jerolyn Center.    Acute on chronic combined systolic and diastolic HF, NICM Patient seen by AHF January 2023. 07/13/21 Echo 20-25%, RV normal, grade II DD , and septal -lateral dyssynchrony. 07/2021 cMRI possible myocarditis. LHC/RHC at that time with minimal CAD. RA 6 PCWP 21, CO 6.8/ CI 3.1. Patient then had small recovery of LVEF, Echo(4/23): EF 30-35%. EF then decreased again on 04/2022 TTE. S/p St. Jude Abbott CRT-D had narrowing of QRS, improvement in symptoms. Currently admitted with suspected pneumonia. Echo shows stable LVEF but worsened diastolic dysfunction. BP low this admission, likely of mixed etiology secondary to poor oral intake and acute  illness. She is euvolemic appearing on exam and with normal IVC size/normal RA pressure along with hypotension. CorVue on device interrogation low. Recommend holding further diuresis at this time and resuming daily oral lasix at discharge.   Would recommend holding Entresto. Will arrange close outpatient HF clinic follow up. Not low output, normal lactic acid Continue Digoxin Continue Bisoprolol Jardiance will need to be held during UTI treatment  Hypertension BP low this admission, improved this morning with systolic BP now low 100s. GDMT as above.   LBBB Patient s/p Abbott St Jude CRT-D. QRS width appears largely stable with recent ECGs.    CVA Hyperlipidemia Patient with CVA in November 2024. Continue Plavix and statin.    Pneumonia Per primary team.    UTI Per primary team   DM type II Per primary team  For questions or updates, please contact [facility] Please consult [email or web address] for contact info under        Signed, Perlie Gold, PA-C

## 2023-08-27 NOTE — Progress Notes (Signed)
 PT Cancellation Note  Patient Details Name: Lynn Estrada MRN: [id number] DOB: [birth year]   Cancelled Treatment:    Reason Eval/Treat Not Completed: Other (comment) (pt just ambulated with nursing and is now bathing. Will check back later today.)   Tamala Ser PT 08/27/2023  Acute Rehabilitation Services  Office [phone]

## 2023-08-27 NOTE — Progress Notes (Signed)
 Remote ICD transmission.

## 2023-08-27 NOTE — Discharge Summary (Addendum)
 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 In addition to the above she had complaints of frequent urination nausea vomiting and lower abdominal discomfort. RVP Negative BNP 1039  Ua mod LE 50 wbc many bacteria urine culture with more than 100,000 colonies of gram-negative rods Wbc 7.8  Chest xray-New right infrahilar heterogeneous airspace opacification and probable similar heterogeneous airspace opacity overlying the left lower lungs/cardiac silhouette. This is suspicious for at least right lower lung pneumonia and possible multifocal pneumonia. Not hypoxic on admit  Discharge Diagnoses:  Principal Problem:   Acute on chronic combined systolic and diastolic heart failure (HCC) Active Problems:   Essential hypertension   Overweight (BMI 25.0-29.9)   CAP (community acquired pneumonia)   Normocytic anemia   Hypertriglyceridemia   Type 2 diabetes mellitus with hyperglycemia, without long-term current use of insulin (HCC)   Frequency of urination and polyuria  #1CAP- patient admitted with productive cough pleuritic chest pain generalized weakness.  She had complaints of hemoptysis on admission which was resolved on discharge.  She was not hypoxic on admission she did not have leukocytosis on admission.  Chest x-ray  consistent with right lower lobe pneumonia versus possible multifocal pneumonia.  She was treated with Rocephin and azithromycin.  She was discharged on Augmentin to finish the course. She ambulated in the hallway prior to discharge and was able to maintain her saturation on room air.  #2 UTI UA consistent with UTI she was symptomatic with frequency nausea vomiting and lower abdominal discomfort.  She received Rocephin during the hospital stay for 4 days.    #3 acute on chronic combined systolic and diastolic heart failure last echo in November 2024 with a EF of 20 to 25% and grade 1 diastolic dysfunction  Repeat echo 2/23 ef 25 % grade 2 DD.  She was seen in consultation by cardiology due to soft blood pressure Entresto was stopped. Lasix bisoprolol and digoxin were continued.Entresto were stopped on discharge.   Jardiance on hold due to UTI   #4 essential hypertension blood pressure was running soft during the hospital stay.  [name] was stopped on discharge.  She was asked to follow-up with heart failure team.    #5 hyperlipidemia continue statin   #6 type 2 diabetes on metformin 500 twice daily     #7 overweight BMI 26 complicates overall prognosis and outcome   #8 Prolonged QTC-she received only 3 days of azithromycin.   Estimated body mass index is 27.31 kg/m as calculated from the following:   Height as of this encounter: 6\' 1"  (1.854 m).   Weight as of this encounter: 93.9 kg.  Discharge Instructions  Discharge Instructions     Avoid straining   Complete by: As directed    Call MD for:  difficulty breathing, headache or visual disturbances   Complete by: As directed    Diet - low sodium heart healthy   Complete by: As directed    Heart Failure patients record  your daily weight using the same scale at the same time of day   Complete by: As directed    Increase activity slowly   Complete by: As directed    STOP any activity that causes chest pain, shortness of breath,  dizziness, sweating, or exessive weakness   Complete by: As directed       Allergies as of 08/27/2023       Reactions   Latex Rash, Hives   Bactrim [sulfamethoxazole-trimethoprim] Rash   Carvedilol Itching   Metoprolol Itching, Rash        Medication List     STOP taking these medications    empagliflozin 10 MG Tabs tablet Commonly known as: JARDIANCE   Entresto 24-26 MG Generic drug: sacubitril-valsartan   ondansetron 4 MG disintegrating tablet Commonly known as: ZOFRAN-ODT       TAKE these medications    acetaminophen 500 MG tablet Commonly known as: TYLENOL Take 500-1,000 mg by mouth every 6 (six) hours as needed (for pain.).   amoxicillin-clavulanate 875-125 MG tablet Commonly known as: AUGMENTIN Take 1 tablet by mouth 2 (two) times daily.   aspirin EC 81 MG tablet Take 81 mg by mouth daily. Swallow whole.   atorvastatin 10 MG tablet Commonly known as: LIPITOR Take 10 mg by mouth in the morning.   bisoprolol 5 MG tablet Commonly known as: ZEBETA Take 0.5 tablets (2.5 mg total) by mouth daily. What changed: when to take this   Calcium Antacid 500 MG chewable tablet Generic drug: calcium carbonate Chew 1 tablet (200 mg of elemental calcium total) by mouth 2 (two) times daily with a meal.   clopidogrel 75 MG tablet Commonly known as: PLAVIX Take 1 tablet (75 mg total) by mouth daily. What changed: when to take this   digoxin 0.125 MG tablet Commonly known as: LANOXIN TAKE 1 TABLET BY MOUTH EVERY DAY What changed: when to take this   furosemide 20 MG tablet Commonly known as: Lasix Take 1 tablet (20 mg total) by mouth daily. What changed:  medication strength how much to take when to take this   metFORMIN 500 MG tablet Commonly known as: GLUCOPHAGE Take 1 tablet (500 mg total) by mouth 2 (two) times daily with a meal.   multivitamin with minerals Tabs tablet Take 1 tablet by mouth in the morning. One A Day for Women 50+         Follow-up Information     [facility]. Call.   Specialty: Rehabilitation Why: outpatient physical therapy Contact information: [address] [place] [address] [phone]        Porfirio Oar, PA Follow up.   Specialty: Family Medicine Contact information: [address] [place] [address] [phone]         Bensimhon, Bevelyn Buckles, MD Follow up.   Specialty: Cardiology Contact information: [address] [place] [address] [phone]                Allergies  Allergen Reactions   Latex Rash and Hives   Bactrim [Sulfamethoxazole-Trimethoprim] Rash   Carvedilol Itching   Metoprolol Itching and Rash    Consultations: cardiology   Procedures/Studies: ECHOCARDIOGRAM COMPLETE Result Date: 08/25/2023    ECHOCARDIOGRAM REPORT   Patient Name:   Lynn Estrada Azzarello Date of Exam: 08/25/2023 Medical Rec #:  [id number]         Height:       73.0 in Accession #:  [id number]        Weight:       200.2 lb Date of Birth:  Jul 06, 1960        BSA:          2.153 m Patient Age:    62 years          BP:           95/66 mmHg Patient Gender: F                 HR:           96 bpm. Exam Location:  Inpatient Procedure: 2D Echo, Color Doppler and Cardiac Doppler (Both Spectral and Color            Flow Doppler were utilized during procedure). Indications:    Abnormal ECG R94.31  History:        Patient has prior history of Echocardiogram examinations, most                 recent 05/25/2023. Arrythmias:LBBB; Risk Factors:Hypertension                 and Diabetes.  Sonographer:    Webb Laws Referring Phys: [id number] Daylyn Azbill G Jalal Rauch IMPRESSIONS  1. Left ventricular ejection fraction, by estimation, is 20 to 25%. The left ventricle has severely decreased function. The left ventricle demonstrates global hypokinesis. The left ventricular internal cavity size was severely  dilated. Left ventricular diastolic parameters are consistent with Grade II diastolic dysfunction (pseudonormalization). Elevated left atrial pressure.  2. Right ventricular systolic function is normal. The right ventricular size is normal. Tricuspid regurgitation signal is inadequate for assessing PA pressure.  3. Left atrial size was moderately dilated.  4. The mitral valve is normal in structure. Trivial mitral valve regurgitation. No evidence of mitral stenosis.  5. The aortic valve is tricuspid. Aortic valve regurgitation is not visualized. No aortic stenosis is present.  6. The inferior vena cava is normal in size with greater than 50% respiratory variability, suggesting right atrial pressure of 3 mmHg. Comparison(s): Prior images reviewed side by side. The left ventricular function is unchanged. The left ventricular diastolic function is significantly worse. FINDINGS  Left Ventricle: Left ventricular ejection fraction, by estimation, is 20 to 25%. The left ventricle has severely decreased function. The left ventricle demonstrates global hypokinesis. Strain imaging was not performed. The left ventricular internal cavity size was severely dilated. There is no left ventricular hypertrophy. Abnormal (paradoxical) septal motion, consistent with left bundle branch block. Left ventricular diastolic parameters are consistent with Grade II diastolic dysfunction (pseudonormalization). Elevated left atrial pressure. Right Ventricle: The right ventricular size is normal. No increase in right ventricular wall thickness. Right ventricular systolic function is normal. Tricuspid regurgitation signal is inadequate for assessing PA pressure. Left Atrium: Left atrial size was moderately dilated. Right Atrium: Right atrial size was normal in size. Pericardium: Trivial pericardial effusion is present. Mitral Valve: The mitral valve is normal in structure. Trivial mitral valve regurgitation. No evidence of mitral valve stenosis.  Tricuspid Valve: The tricuspid valve is normal in structure. Tricuspid valve regurgitation is not demonstrated. Aortic Valve: The aortic valve is tricuspid. Aortic valve regurgitation is not visualized. No aortic stenosis is present. Pulmonic Valve: The pulmonic valve was grossly normal. Pulmonic valve regurgitation is not visualized. No evidence of pulmonic stenosis. Aorta: The aortic root and ascending aorta are structurally normal, with no evidence of dilitation. Venous: The inferior vena cava is normal in size with greater than 50%  respiratory variability, suggesting right atrial pressure of 3 mmHg. IAS/Shunts: No atrial level shunt detected by color flow Doppler. Additional Comments: 3D imaging was not performed.  LEFT VENTRICLE PLAX 2D LVIDd:         7.20 cm   Diastology LVIDs:         6.50 cm   LV e' medial:    5.44 cm/s LV PW:         1.10 cm   LV E/e' medial:  23.0 LV IVS:        0.90 cm   LV e' lateral:   3.26 cm/s LVOT diam:     2.30 cm   LV E/e' lateral: 38.3 LV SV:         97 LV SV Index:   45 LVOT Area:     4.15 cm  RIGHT VENTRICLE             IVC RV Basal diam:  2.90 cm     IVC diam: 2.00 cm RV S prime:     11.90 cm/s TAPSE (M-mode): 2.5 cm LEFT ATRIUM             Index        RIGHT ATRIUM           Index LA diam:        4.90 cm 2.28 cm/m   RA Area:     11.40 cm LA Vol (A2C):   71.3 ml 33.12 ml/m  RA Volume:   25.40 ml  11.80 ml/m LA Vol (A4C):   51.7 ml 24.02 ml/m LA Biplane Vol: 66.6 ml 30.94 ml/m  AORTIC VALVE LVOT Vmax:   146.00 cm/s LVOT Vmean:  89.900 cm/s LVOT VTI:    0.234 m  AORTA Ao Root diam: 3.00 cm Ao Asc diam:  3.50 cm MITRAL VALVE MV Area (PHT): 7.66 cm     SHUNTS MV Decel Time: 99 msec      Systemic VTI:  0.23 m MV E velocity: 125.00 cm/s  Systemic Diam: 2.30 cm MV A velocity: 93.30 cm/s MV E/A ratio:  1.34 Mihai Croitoru MD Electronically signed by Thurmon Fair MD Signature Date/Time: 08/25/2023/1:38:57 PM    Final    DG Chest Portable 1 View Result Date:  08/23/2023 CLINICAL DATA:  Productive cough, fatigue, and headache. EXAM: PORTABLE CHEST 1 VIEW COMPARISON:  Chest radiograph 01/14/2023 and 05/14/2022 FINDINGS: Chest wall cardiac AICD is again seen with leads not significantly changed. Cardiac silhouette and mediastinal contours are unchanged and within normal limits with moderate calcification within the aortic arch. New right infrahilar heterogeneous airspace opacification. Probable similar heterogeneous airspace opacity overlying the left lower lungs/cardiac silhouette. No definite pleural effusion. No pneumothorax. Mild dextrocurvature of the midthoracic spine. Moderate multilevel degenerative disc and endplate changes. IMPRESSION: New right infrahilar heterogeneous airspace opacification and probable similar heterogeneous airspace opacity overlying the left lower lungs/cardiac silhouette. This is suspicious for at least right lower lung pneumonia and possible multifocal pneumonia. Electronically Signed   By: Neita Garnet M.D.   On: 08/23/2023 15:01   CT ABDOMEN PELVIS W CONTRAST Result Date: 08/04/2023 CLINICAL DATA:  Right lower quadrant pain with vomiting EXAM: CT ABDOMEN AND PELVIS WITH CONTRAST TECHNIQUE: Multidetector CT imaging of the abdomen and pelvis was performed using the standard protocol following bolus administration of intravenous contrast. RADIATION DOSE REDUCTION: This exam was performed according to the departmental dose-optimization program which includes automated exposure control, adjustment of the mA and/or kV according to patient size and/or  use of iterative reconstruction technique. CONTRAST:  OMNIPAQUE IOHEXOL 300 MG/ML  SOLN COMPARISON:  CT 09/07/2022 trauma CT 12/31/2016 FINDINGS: Lower chest: Lung bases demonstrate no pleural effusion. Patchy ground-glass density at the right greater than left lung base. Cardiomegaly with partially visualized pacing leads. Hepatobiliary: No calcified gallstone or biliary dilatation.  Subcentimeter hypodensity in the posterior right hepatic lobe series 2, image 21, less apparent on delayed views and probably representing a small hemangioma, this appears smaller compared to 2018 study. Heterogenous low-density near the falciform ligament. Pancreas: Unremarkable. No pancreatic ductal dilatation or surrounding inflammatory changes. Spleen: Nonspecific subcentimeter hypodensity in the posterior spleen, too small to further characterize Adrenals/Urinary Tract: Adrenal glands are normal. No hydronephrosis. Multiple small bilateral kidney stones. Postsurgical changes at the lower aspect of right kidney. Cortical scarring within both kidneys. Suspicion of punctate foci of gas within the right renal collecting system, for example series 2, image 30 and 28. Bladder unremarkable, no air in the urinary bladder. Stomach/Bowel: Stomach nonenlarged. There is no dilated small bowel. Negative appendix. No acute bowel wall thickening Vascular/Lymphatic: Advanced aortic atherosclerosis. No aneurysm. No suspicious lymph nodes Reproductive: Hysterectomy.  No adnexal mass Other: Negative for pelvic effusion or free air Musculoskeletal: Degenerative changes at L5-S1. No acute osseous abnormality IMPRESSION: 1. Negative for hydronephrosis or bowel obstruction. Negative appendix. 2. Multiple small bilateral kidney stones with areas of cortical scarring in the kidneys. Suspicion of punctate foci of gas within the right renal collecting system, this could be due to refluxed gas from recent bladder instrumentation though no gas is seen in the bladder at this time. Infection could also produce gas in the renal collecting system and correlation with urinalysis is recommended 3. Patchy ground-glass density at the right greater than left lung base, nonspecific and could be due to atelectasis, small airways disease, or edema. Cardiomegaly. 4. Aortic atherosclerosis. Aortic Atherosclerosis (ICD10-I70.0). Electronically Signed    By: Jasmine Pang M.D.   On: 08/04/2023 16:21   (Echo, Carotid, EGD, Colonoscopy, ERCP)    Subjective: Resting in bed  Anxious to go home  Discharge Exam: Vitals:   08/27/23 0247 08/27/23 0802  BP: 103/73   Pulse: 89   Resp: 20   Temp: 99 F (37.2 C)   SpO2: 95% 100%   Vitals:   08/26/23 2114 08/27/23 0247 08/27/23 0500 08/27/23 0802  BP: 99/72 103/73    Pulse:  89    Resp: 18 20    Temp: (!) 100.5 F (38.1 C) 99 F (37.2 C)    TempSrc: Oral     SpO2: 100% 95%  100%  Weight:   93.9 kg   Height:        General: Pt is alert, awake, not in acute distress Cardiovascular: RRR, S1/S2 +, no rubs, no gallops Respiratory: CTA bilaterally, no wheezing, no rhonchi Abdominal: Soft, NT, ND, bowel sounds + Extremities: no edema, no cyanosis    The results of significant diagnostics from this hospitalization (including imaging, microbiology, ancillary and laboratory) are listed below for reference.     Microbiology: Recent Results (from the past 240 hours)  Resp panel by RT-PCR (RSV, Flu A&B, Covid) Anterior Nasal Swab     Status: None   Collection Time: 08/23/23  1:13 PM   Specimen: Anterior Nasal Swab  Result Value Ref Range Status   SARS Coronavirus 2 by RT PCR NEGATIVE NEGATIVE Final    Comment: (NOTE) SARS-CoV-2 target nucleic acids are NOT DETECTED.  The SARS-CoV-2 RNA is generally detectable  in upper respiratory specimens during the acute phase of infection. The lowest concentration of SARS-CoV-2 viral copies this assay can detect is 138 copies/mL. A negative result does not preclude SARS-Cov-2 infection and should not be used as the sole basis for treatment or other patient management decisions. A negative result may occur with  improper specimen collection/handling, submission of specimen other than nasopharyngeal swab, presence of viral mutation(s) within the areas targeted by this assay, and inadequate number of viral copies(<138 copies/mL). A negative  result must be combined with clinical observations, patient history, and epidemiological information. The expected result is Negative.  Fact Sheet for Patients:  BloggerCourse.com  Fact Sheet for Healthcare Providers:  SeriousBroker.it  This test is no t yet approved or cleared by the [place] FDA and  has been authorized for detection and/or diagnosis of SARS-CoV-2 by FDA under an Emergency Use Authorization (EUA). This EUA will remain  in effect (meaning this test can be used) for the duration of the COVID-19 declaration under Section 564(b)(1) of the Act, 21 U.S.C.section 360bbb-3(b)(1), unless the authorization is terminated  or revoked sooner.       Influenza A by PCR NEGATIVE NEGATIVE Final   Influenza B by PCR NEGATIVE NEGATIVE Final    Comment: (NOTE) The Xpert Xpress SARS-CoV-2/FLU/RSV plus assay is intended as an aid in the diagnosis of influenza from Nasopharyngeal swab specimens and should not be used as a sole basis for treatment. Nasal washings and aspirates are unacceptable for Xpert Xpress SARS-CoV-2/FLU/RSV testing.  Fact Sheet for Patients: BloggerCourse.com  Fact Sheet for Healthcare Providers: SeriousBroker.it  This test is not yet approved or cleared by the [place] FDA and has been authorized for detection and/or diagnosis of SARS-CoV-2 by FDA under an Emergency Use Authorization (EUA). This EUA will remain in effect (meaning this test can be used) for the duration of the COVID-19 declaration under Section 564(b)(1) of the Act, 21 U.S.C. section 360bbb-3(b)(1), unless the authorization is terminated or revoked.     Resp Syncytial Virus by PCR NEGATIVE NEGATIVE Final    Comment: (NOTE) Fact Sheet for Patients: BloggerCourse.com  Fact Sheet for Healthcare Providers: SeriousBroker.it  This  test is not yet approved or cleared by the [place] FDA and has been authorized for detection and/or diagnosis of SARS-CoV-2 by FDA under an Emergency Use Authorization (EUA). This EUA will remain in effect (meaning this test can be used) for the duration of the COVID-19 declaration under Section 564(b)(1) of the Act, 21 U.S.C. section 360bbb-3(b)(1), unless the authorization is terminated or revoked.  Performed at [facility], [address] [place], [place] [address]   Urine Culture     Status: Abnormal   Collection Time: 08/23/23  3:45 PM   Specimen: Urine, Clean Catch  Result Value Ref Range Status   Specimen Description   Final    URINE, CLEAN CATCH Performed at [facility] [address] [place], [place] [address]    Special Requests   Final    NONE Performed at [facility], [address], [place], [place] [address]    Culture >=100,000 COLONIES/mL KLEBSIELLA PNEUMONIAE (A)  Final   Report Status 08/26/2023 FINAL  Final   Organism ID, Bacteria KLEBSIELLA PNEUMONIAE (A)  Final      Susceptibility   Klebsiella pneumoniae - MIC*    AMPICILLIN >=32 RESISTANT Resistant     CEFAZOLIN <=4 SENSITIVE Sensitive     CEFEPIME <=0.12 SENSITIVE Sensitive  CEFTRIAXONE <=0.25 SENSITIVE Sensitive     CIPROFLOXACIN <=0.25 SENSITIVE Sensitive     GENTAMICIN <=1 SENSITIVE Sensitive     IMIPENEM 0.5 SENSITIVE Sensitive     NITROFURANTOIN 32 SENSITIVE Sensitive     TRIMETH/SULFA <=20 SENSITIVE Sensitive     AMPICILLIN/SULBACTAM 16 INTERMEDIATE Intermediate     PIP/TAZO 8 SENSITIVE Sensitive ug/mL    * >=100,000 COLONIES/mL KLEBSIELLA PNEUMONIAE     Labs: BNP (last 3 results) Recent Labs    08/23/23 1312  BNP 1,039.6*   Basic Metabolic Panel: Recent Labs  Lab 08/23/23 1313 08/23/23 2011 08/24/23 0645 08/25/23 1210 08/26/23 0501 08/27/23 0501  NA 139  --  139 136 136 139  K 3.6  --  3.8 4.2 3.8 4.4  CL  103  --  102 100 100 104  CO2 26  --  26 27 25 23   GLUCOSE 106*  --  113* 113* 103* 90  BUN 14  --  16 18 21 20   CREATININE 0.97  --  1.09* 1.05* 1.13* 0.99  CALCIUM 9.0  --  8.7* 8.8* 8.4* 8.9  MG  --  1.9  --   --   --   --   PHOS  --  3.6  --   --   --   --    Liver Function Tests: Recent Labs  Lab 08/23/23 1313 08/24/23 0645  AST 18 19  ALT 19 22  ALKPHOS 44 44  BILITOT 1.1 1.2  PROT 7.6 7.1  ALBUMIN 4.0 3.5   No results for input(s): "LIPASE", "AMYLASE" in the last 168 hours. No results for input(s): "AMMONIA" in the last 168 hours. CBC: Recent Labs  Lab 08/23/23 1313 08/24/23 0645  WBC 7.8 7.1  HGB 10.6* 10.1*  HCT 34.0* 32.7*  MCV 90.7 92.6  PLT 217 193   Cardiac Enzymes: No results for input(s): "CKTOTAL", "CKMB", "CKMBINDEX", "TROPONINI" in the last 168 hours. BNP: Invalid input(s): "POCBNP" CBG: Recent Labs  Lab 08/26/23 1118 08/26/23 1709 08/26/23 2105 08/27/23 0813 08/27/23 1224  GLUCAP 121* 102* 114* 112* 140*   D-Dimer No results for input(s): "DDIMER" in the last 72 hours. Hgb A1c No results for input(s): "HGBA1C" in the last 72 hours. Lipid Profile No results for input(s): "CHOL", "HDL", "LDLCALC", "TRIG", "CHOLHDL", "LDLDIRECT" in the last 72 hours. Thyroid function studies No results for input(s): "TSH", "T4TOTAL", "T3FREE", "THYROIDAB" in the last 72 hours.  Invalid input(s): "FREET3" Anemia work up No results for input(s): "VITAMINB12", "FOLATE", "FERRITIN", "TIBC", "IRON", "RETICCTPCT" in the last 72 hours. Urinalysis    Component Value Date/Time   COLORURINE YELLOW 08/23/2023 1422   APPEARANCEUR CLEAR 08/23/2023 1422   LABSPEC 1.015 08/23/2023 1422   PHURINE 7.0 08/23/2023 1422   GLUCOSEU NEGATIVE 08/23/2023 1422   HGBUR NEGATIVE 08/23/2023 1422   BILIRUBINUR NEGATIVE 08/23/2023 1422   BILIRUBINUR negative 12/07/2015 1226   KETONESUR NEGATIVE 08/23/2023 1422   PROTEINUR NEGATIVE 08/23/2023 1422   UROBILINOGEN 0.2  12/07/2015 1226   NITRITE NEGATIVE 08/23/2023 1422   LEUKOCYTESUR MODERATE (A) 08/23/2023 1422   Sepsis Labs Recent Labs  Lab 08/23/23 1313 08/24/23 0645  WBC 7.8 7.1   Microbiology Recent Results (from the past 240 hours)  Resp panel by RT-PCR (RSV, Flu A&B, Covid) Anterior Nasal Swab     Status: None   Collection Time: 08/23/23  1:13 PM   Specimen: Anterior Nasal Swab  Result Value Ref Range Status   SARS Coronavirus 2 by RT PCR NEGATIVE NEGATIVE Final  Comment: (NOTE) SARS-CoV-2 target nucleic acids are NOT DETECTED.  The SARS-CoV-2 RNA is generally detectable in upper respiratory specimens during the acute phase of infection. The lowest concentration of SARS-CoV-2 viral copies this assay can detect is 138 copies/mL. A negative result does not preclude SARS-Cov-2 infection and should not be used as the sole basis for treatment or other patient management decisions. A negative result may occur with  improper specimen collection/handling, submission of specimen other than nasopharyngeal swab, presence of viral mutation(s) within the areas targeted by this assay, and inadequate number of viral copies(<138 copies/mL). A negative result must be combined with clinical observations, patient history, and epidemiological information. The expected result is Negative.  Fact Sheet for Patients:  BloggerCourse.com  Fact Sheet for Healthcare Providers:  SeriousBroker.it  This test is no t yet approved or cleared by the [place] FDA and  has been authorized for detection and/or diagnosis of SARS-CoV-2 by FDA under an Emergency Use Authorization (EUA). This EUA will remain  in effect (meaning this test can be used) for the duration of the COVID-19 declaration under Section 564(b)(1) of the Act, 21 U.S.C.section 360bbb-3(b)(1), unless the authorization is terminated  or revoked sooner.       Influenza A by PCR NEGATIVE  NEGATIVE Final   Influenza B by PCR NEGATIVE NEGATIVE Final    Comment: (NOTE) The Xpert Xpress SARS-CoV-2/FLU/RSV plus assay is intended as an aid in the diagnosis of influenza from Nasopharyngeal swab specimens and should not be used as a sole basis for treatment. Nasal washings and aspirates are unacceptable for Xpert Xpress SARS-CoV-2/FLU/RSV testing.  Fact Sheet for Patients: BloggerCourse.com  Fact Sheet for Healthcare Providers: SeriousBroker.it  This test is not yet approved or cleared by the [place] FDA and has been authorized for detection and/or diagnosis of SARS-CoV-2 by FDA under an Emergency Use Authorization (EUA). This EUA will remain in effect (meaning this test can be used) for the duration of the COVID-19 declaration under Section 564(b)(1) of the Act, 21 U.S.C. section 360bbb-3(b)(1), unless the authorization is terminated or revoked.     Resp Syncytial Virus by PCR NEGATIVE NEGATIVE Final    Comment: (NOTE) Fact Sheet for Patients: BloggerCourse.com  Fact Sheet for Healthcare Providers: SeriousBroker.it  This test is not yet approved or cleared by the [place] FDA and has been authorized for detection and/or diagnosis of SARS-CoV-2 by FDA under an Emergency Use Authorization (EUA). This EUA will remain in effect (meaning this test can be used) for the duration of the COVID-19 declaration under Section 564(b)(1) of the Act, 21 U.S.C. section 360bbb-3(b)(1), unless the authorization is terminated or revoked.  Performed at [facility], [address] [place], [place] [address]   Urine Culture     Status: Abnormal   Collection Time: 08/23/23  3:45 PM   Specimen: Urine, Clean Catch  Result Value Ref Range Status   Specimen Description   Final    URINE, CLEAN CATCH Performed at [facility], [address], [place], [place] [address]    Special Requests   Final    NONE Performed at [facility], [address] [place], [place] [address]    Culture >=100,000 COLONIES/mL KLEBSIELLA PNEUMONIAE (A)  Final   Report Status 08/26/2023 FINAL  Final   Organism ID, Bacteria KLEBSIELLA PNEUMONIAE (A)  Final      Susceptibility   Klebsiella pneumoniae - MIC*    AMPICILLIN >=32 RESISTANT Resistant  CEFAZOLIN <=4 SENSITIVE Sensitive     CEFEPIME <=0.12 SENSITIVE Sensitive     CEFTRIAXONE <=0.25 SENSITIVE Sensitive     CIPROFLOXACIN <=0.25 SENSITIVE Sensitive     GENTAMICIN <=1 SENSITIVE Sensitive     IMIPENEM 0.5 SENSITIVE Sensitive     NITROFURANTOIN 32 SENSITIVE Sensitive     TRIMETH/SULFA <=20 SENSITIVE Sensitive     AMPICILLIN/SULBACTAM 16 INTERMEDIATE Intermediate     PIP/TAZO 8 SENSITIVE Sensitive ug/mL    * >=100,000 COLONIES/mL KLEBSIELLA PNEUMONIAE     SIGNED: Time spend more than 30 min  Alwyn Ren, MD  [facility] 08/27/2023, 5:24 PM

## 2023-08-27 NOTE — Progress Notes (Signed)
 Pt agreeable to walk test. Was noted saturating 97-98% on room air prior to ambulation. Pt walked approximately 500 ft, desat to 91% without complaints of shortness of breath. Returned back to room. Left resting comfortably.

## 2023-08-27 NOTE — Progress Notes (Signed)
 Physical Therapy Treatment Patient Details Name: Lynn Estrada MRN: [id number] DOB: 05/24/1961 Today's Date: 08/27/2023   History of Present Illness Pt is a 63 y/o female admitted 08/23/23 due to SOB, productive cough, chest pain, and generalized weakness. Dx with Acute on chronic combined systolic and diastolic heart failure, community acquired pneumonia, and UTI. PMH includes Anemia, Arthritis, CHF (12/06/2022), COPD, DM, History of chronic gastritis (08/03/2016), Partial nephrectomy, adenomatous colonic polyps (08/03/2016), Hyperlipidemia, HTN, MI, Nephrolithiasis, Urgency of urination, Lumbar disc surgery (4098,1191); R/L Heart Cath and Coronary Angiography (07/18/2021); and polypectomy (03/26/2023), and CVA 2024.    PT Comments  Pt is progressing well with mobility. She ambulated 52' with RW, no loss of balance. Encouraged gradual increase in activity level at home. Pt is looking into outpatient PT options. PT goals met, will sign off.     If plan is discharge home, recommend the following: Assistance with cooking/housework   Can travel by private vehicle        Equipment Recommendations  None recommended by PT    Recommendations for Other Services       Precautions / Restrictions Precautions Precautions: Fall Restrictions Weight Bearing Restrictions Per Provider Order: No     Mobility  Bed Mobility Overal bed mobility: Modified Independent             General bed mobility comments: HOB up ~30*    Transfers Overall transfer level: Modified independent Equipment used: Rolling walker (2 wheels) Transfers: Sit to/from Stand Sit to Stand: Modified independent (Device/Increase time)                Ambulation/Gait Ambulation/Gait assistance: Modified independent (Device/Increase time) Gait Distance (Feet): 130 Feet Assistive device: Rolling walker (2 wheels) Gait Pattern/deviations: WFL(Within Functional Limits) Gait velocity: WFL     General Gait  Details: steady, no loss of balance   Stairs             Wheelchair Mobility     Tilt Bed    Modified Rankin (Stroke Patients Only)       Balance     Sitting balance-Leahy Scale: Good     Standing balance support: Single extremity supported, During functional activity, No upper extremity supported Standing balance-Leahy Scale: Fair                              Hotel manager: No apparent difficulties  Cognition Arousal: Alert Behavior During Therapy: WFL for tasks assessed/performed, Flat affect                             Following commands: Intact      Cueing    Exercises      General Comments        Pertinent Vitals/Pain Pain Assessment Pain Assessment: No/denies pain    Home Living                          Prior Function            PT Goals (current goals can now be found in the care plan section) Acute Rehab PT Goals Patient Stated Goal[facility] Goal Formulation: With patient Time For Goal Achievement: 09/08/23 Potential to Achieve Goals: Good Progress towards PT goals: Goals met/education completed, patient discharged from PT    Frequency    Min 1X/week  PT Plan      Co-evaluation              AM-PAC PT "6 Clicks" Mobility   Outcome Measure  Help needed turning from your back to your side while in a flat bed without using bedrails?: None Help needed moving from lying on your back to sitting on the side of a flat bed without using bedrails?: None Help needed moving to and from a bed to a chair (including a wheelchair)?: None Help needed standing up from a chair using your arms (e.g., wheelchair or bedside chair)?: None Help needed to walk in hospital room?: None Help needed climbing 3-5 steps with a railing? : None 6 Click Score: 24    End of Session   Activity Tolerance: Patient tolerated treatment well Patient left: in bed;with  call bell/phone within reach Nurse Communication: Mobility status PT Visit Diagnosis: Muscle weakness (generalized) (M62.81);Difficulty in walking, not elsewhere classified (R26.2)     Time: 1211-1219 PT Time Calculation (min) (ACUTE ONLY): 8 min  Charges:    $Gait Training: 8-22 mins PT General Charges $$ ACUTE PT VISIT: 1 Visit                     Tamala Ser PT 08/27/2023  Acute Rehabilitation Services  Office [phone]

## 2023-09-03 ENCOUNTER — Ambulatory Visit (HOSPITAL_COMMUNITY)
Admit: 2023-09-03 | Discharge: 2023-09-03 | Disposition: A | Discharge status: Home / Self Care | Payer: Medicaid Other | Attending: Internal Medicine | Admitting: Internal Medicine

## 2023-09-03 ENCOUNTER — Encounter (HOSPITAL_COMMUNITY): Payer: Self-pay | Admitting: Internal Medicine

## 2023-09-03 VITALS — BP 110/70 | HR 80 | Wt 201.0 lb

## 2023-09-03 DIAGNOSIS — Z95810 Presence of automatic (implantable) cardiac defibrillator: Secondary | ICD-10-CM | POA: Diagnosis not present

## 2023-09-03 DIAGNOSIS — I2510 Atherosclerotic heart disease of native coronary artery without angina pectoris: Secondary | ICD-10-CM | POA: Diagnosis not present

## 2023-09-03 DIAGNOSIS — Z905 Acquired absence of kidney: Secondary | ICD-10-CM | POA: Insufficient documentation

## 2023-09-03 DIAGNOSIS — K219 Gastro-esophageal reflux disease without esophagitis: Secondary | ICD-10-CM | POA: Diagnosis not present

## 2023-09-03 DIAGNOSIS — Z79899 Other long term (current) drug therapy: Secondary | ICD-10-CM | POA: Insufficient documentation

## 2023-09-03 DIAGNOSIS — Z7984 Long term (current) use of oral hypoglycemic drugs: Secondary | ICD-10-CM | POA: Diagnosis not present

## 2023-09-03 DIAGNOSIS — I447 Left bundle-branch block, unspecified: Secondary | ICD-10-CM | POA: Insufficient documentation

## 2023-09-03 DIAGNOSIS — Z8673 Personal history of transient ischemic attack (TIA), and cerebral infarction without residual deficits: Secondary | ICD-10-CM | POA: Diagnosis not present

## 2023-09-03 DIAGNOSIS — Z7902 Long term (current) use of antithrombotics/antiplatelets: Secondary | ICD-10-CM | POA: Insufficient documentation

## 2023-09-03 DIAGNOSIS — I10 Essential (primary) hypertension: Secondary | ICD-10-CM | POA: Diagnosis not present

## 2023-09-03 DIAGNOSIS — Z87891 Personal history of nicotine dependence: Secondary | ICD-10-CM | POA: Diagnosis not present

## 2023-09-03 DIAGNOSIS — I110 Hypertensive heart disease with heart failure: Secondary | ICD-10-CM | POA: Diagnosis not present

## 2023-09-03 DIAGNOSIS — I428 Other cardiomyopathies: Secondary | ICD-10-CM | POA: Insufficient documentation

## 2023-09-03 DIAGNOSIS — E119 Type 2 diabetes mellitus without complications: Secondary | ICD-10-CM | POA: Insufficient documentation

## 2023-09-03 DIAGNOSIS — I5042 Chronic combined systolic (congestive) and diastolic (congestive) heart failure: Secondary | ICD-10-CM | POA: Insufficient documentation

## 2023-09-03 DIAGNOSIS — E7800 Pure hypercholesterolemia, unspecified: Secondary | ICD-10-CM

## 2023-09-03 MED ORDER — SACUBITRIL-VALSARTAN 24-26 MG PO TABS: 1.0000 | ORAL_TABLET | Freq: Two times a day (BID) | ORAL | 3 refills | Status: DC

## 2023-09-03 MED ORDER — DIGOXIN 125 MCG PO TABS: 125.0000 ug | ORAL_TABLET | Freq: Every day | ORAL | 3 refills | Status: DC

## 2023-09-03 NOTE — Addendum Note (Signed)
 Encounter addended by: Noralee Space, RN on: 09/03/2023 4:18 PM  Actions taken: Diagnosis association updated, Order list changed, Clinical Note Signed

## 2023-09-03 NOTE — Progress Notes (Signed)
 PCP: Olene Floss, [facility] Primary Cardiologist: Dr Sharyn Lull HF MD: Dr Gala Romney   Chief complaint: Heart failure  HPI: Lynn Estrada is a 63 y.o.with a history of HTN, GERD, nephrolithiasis, partial right nephrectomy, LBBB, and combined diastolic/systolic HF.      Admitted 1/23 with acute HFrEF. 07/13/21 Echo 20-25%, RV normal, grade II DD , and septal - lateral dyssnchrony. R/L cath w/ Minimal CAD, mildly elevated filling pressures and normal CO. cMRI ? Myocarditis (see impression below). LVEF 18%. Placed on Jardiance and spiro. Not on bb with acute decompensation. No ARNi with hypotension. Discharged on 07/20/21.   Echo 10/25/21 EF 30-35%  RHC 11/23 RA = 6 RV = 30/10  PA = 35/21 (29) PCW = 20  Fick cardiac output/index = 4.8/2.3 Thermo CO/CI = 5.2/2.4 PVR = 1.9 WU FA sat = 96% PA sat = 62%  CPX 12/23 - submax test. Non diagnostic.  Time 2:45 FVC 2.14 (58% predicted)      FEV1 1.82 (64% predicted)        FEV1/FVC 85%        MVV 65 (59% predicted)  BP rest: 100/74 BP standing: 94/72 BP peak: 92/70  Peak VO2: 8.4 (40.9% predicted peak VO2)  VE/VCO2 slope:  33 Peak RER: 0.85   Underwent Abbott CRT-D on 01/14/23. Got good result with significant QRS narrowing.   Echo 11/24 EF 20-25%   Admitted in 11/24 with stroke like symptoms (aphasia) CTA head a showed a possible small distal ACA occlusion.  CT head was negative for stroke. 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  Admitted 2/25 with PNA and UTI. [name] and Cowen held. Still feels weak. Still with mild cough. Remains fatigued. Still not eating well. Drinking Ensure. No edema, orthopnea or PND. No energy. Not sleeping well.    ROS: All systems negative except as listed in HPI, PMH and Problem List.  SH:  Social History   Socioeconomic History   Marital status: Married    Spouse name: Lynn Estrada   Number of children: 1   Years of  education: college   Highest education level: Not on file  Occupational History   Occupation: Product manager  Tobacco Use   Smoking status: Former    Current packs/day: 0.00    Average packs/day: 0.8 packs/day for 5.0 years (3.8 ttl pk-yrs)    Types: Cigarettes    Start date: 01/11/1992    Quit date: 01/10/1997    Years since quitting: 26.6   Smokeless tobacco: Never  Vaping Use   Vaping status: Never Used  Substance and Sexual Activity   Alcohol use: Not Currently    Comment: social use; once a month   Drug use: No   Sexual activity: Yes    Partners: Male    Birth control/protection: Surgical  Other Topics Concern   Not on file  Social History Narrative   Lives with her husband and their daughter and their dog.   Social Drivers of Corporate investment banker Strain: Low Risk  (08/28/2023)   Received from [facility]   Overall Financial Resource Strain (CARDIA)    Difficulty of Paying Living Expenses: Not hard at all  Food Insecurity: No Food Insecurity (08/28/2023)   Received from [facility]   Hunger Vital Sign    Worried About Running Out of Food in the Last Year: Never true    Ran Out of Food in the  Last Year: Never true  Transportation Needs: No Transportation Needs (08/28/2023)   Received from [facility] - Transportation    Lack of Transportation (Medical): No    Lack of Transportation (Non-Medical): No  Physical Activity: Insufficiently Active (08/20/2022)   Received from [facility], [facility]   Exercise Vital Sign    Days of Exercise per Week: 3 days    Minutes of Exercise per Session: 30 min  Stress: No Stress Concern Present (08/20/2022)   Received from [facility], [facility] - Occupational Stress Questionnaire    Feeling of Stress : Only a [place]  Social Connections: Socially Integrated (08/20/2022)   Received from [facility], [facility]   Social Network    How would you rate  your social network (family, work, friends)?: Good participation with social networks  Intimate Partner Violence: Not At Risk (08/24/2023)   Humiliation, Afraid, Rape, and Kick questionnaire    Fear of Current or Ex-Partner: No    Emotionally Abused: No    Physically Abused: No    Sexually Abused: No   FH:  Family History  Problem Relation Age of Onset   Sarcoidosis Mother    Prostate cancer Father    Hypertension Sister    Eczema Sister    Lupus Sister    Breast cancer Maternal Aunt    Gout Maternal Grandmother    Diabetes Maternal Grandmother        leg amputations   Stomach cancer Neg Hx    Colon cancer Neg Hx    Esophageal cancer Neg Hx    Rectal cancer Neg Hx    Past Medical History:  Diagnosis Date   Anemia    Arthritis    CHF (congestive heart failure) (HCC) 12/06/2022   EF is 20% to 25 %   COPD (chronic obstructive pulmonary disease) (HCC)    Diabetes mellitus without complication (HCC)    Eczema    GERD (gastroesophageal reflux disease)    History of adenomatous polyp of colon    08-03-2016  tubular adenoma x2 and hyperplastic   History of chronic gastritis 08/03/2016   History of ectopic pregnancy 1988   s/p  left salpingectomy   History of endometriosis    History of kidney stones    History of partial nephrectomy    right pelvis for very large stone   History of pneumonia 07/02/2016   CAP   History of sepsis    07-01-2016  sepsis w/ pyelonephritis, CAP /   12-31-2016  urosepsis w/ kidney stone obstruction   History of uterine leiomyoma    Hx of adenomatous colonic polyps 08/03/2016   2 adenomas, 1 hpp and 1 lost polyp 2018 all < 1 cm    Hyperlipidemia    Hypertension    Left ureteral stone    Myocardial infarction (HCC)    Nephrolithiasis    left obstructive stone and right non-obstructive stone  per CT 12-31-2016   Urgency of urination    Current Outpatient Medications  Medication Sig Dispense Refill   acetaminophen (TYLENOL) 500 MG tablet  Take 500-1,000 mg by mouth every 6 (six) hours as needed (for pain.).     atorvastatin (LIPITOR) 10 MG tablet Take 10 mg by mouth in the morning.     bisoprolol (ZEBETA) 5 MG tablet Take 0.5 tablets (2.5 mg total) by mouth daily. 45 tablet 3   Calcium Carbonate Antacid (TUMS E-X PO) Take 1 tablet by  mouth daily.     clopidogrel (PLAVIX) 75 MG tablet Take 1 tablet (75 mg total) by mouth daily. 90 tablet 3   digoxin (LANOXIN) 0.125 MG tablet TAKE 1 TABLET BY MOUTH EVERY DAY 90 tablet 3   furosemide (LASIX) 20 MG tablet Take 1 tablet (20 mg total) by mouth daily. 30 tablet 2   metFORMIN (GLUCOPHAGE) 500 MG tablet Take 1 tablet (500 mg total) by mouth 2 (two) times daily with a meal. 60 tablet 0   Multiple Vitamin (MULTIVITAMIN WITH MINERALS) TABS tablet Take 1 tablet by mouth in the morning. One A Day for Women 50+     aspirin EC 81 MG tablet Take 81 mg by mouth daily. Swallow whole. (Patient not taking: Reported on 09/03/2023)     No current facility-administered medications for this encounter.   BP 110/70   Pulse 80   Wt 91.2 kg (201 lb)   SpO2 99%   BMI 26.52 kg/m   Wt Readings from Last 3 Encounters:  09/03/23 91.2 kg (201 lb)  08/27/23 93.9 kg (207 lb 0.2 oz)  08/04/23 93 kg (205 lb)   PHYSICAL EXAM: General:  Weak appearing. No resp difficulty HEENT: normal Neck: supple. no JVD. Carotids 2+ bilat; no bruits. No lymphadenopathy or thryomegaly appreciated. Cor: PMI nondisplaced. Regular rate & rhythm. No rubs, gallops or murmurs. Lungs: clear Abdomen: soft, nontender, nondistended. No hepatosplenomegaly. No bruits or masses. Good bowel sounds. Extremities: no cyanosis, clubbing, rash, edema Neuro: alert & orientedx3, cranial nerves grossly intact. moves all 4 extremities w/o difficulty. Affect pleasant  ECG: NSR 78 a sensed v paced Personally reviewed   ICD interrogation in clinic: No VT/AF. 99% biV pacing fluid ok Personally reviewed   ASSESSMENT & PLAN:  Chronic Combined  Systolic/Diastolic HF, NICM.  - 07/13/21 Echo 20-25%, RV normal, grade II DD , and septal -lateral dyssnchrony - 07/2021 cMRI possible myocarditis  LVEF 18% Suggestive of LBBB, RVEF 33% - [facility] 07/2021 min CAD. RA 6 PCWP 21, CO 6.8/ CI 3.1 - Echo(4/23): EF 30-35% -> EF improving but still with significant LV dysfunction (suspect resolving myocarditis) - Echo 10/23 EF < 20% RV ok  -RHC 11/23 w/ Mildly elevated filling pressures with mildly decreased CO - CPX 12/23 - submax test. Non diagnostic.  - Echo 12/06/22 EF 20-25%  RV normal.  - Echo 01/14/23 EF < 20% RV ok  - LBBB, QRS  176 Lynn. -> now s/p Abbott CRT-D 7/24 -> QRS  - Echo 11/24 EF 20-25%  - Symptomatically much improved with CRT but recent setback with PNA - NYHA II-III in setting of recent hospitalization  - Volume status looks good - Continue digoxin 0.125 mg daily. Recent level ok on 08/23/23 - Continue spironolactone 25 mg daily  - Continue bisoprolol 2.5 daily - Off entresto 24-26 mg bid and Jardiance 10 with recent PNA - Restart Entresto 24/26 - Hold if SBP < 100 - Repeat echo at next visit   2. HTN - Plan as above  3. LBBB - LBBB, QRS 176 Lynn - now s/p Abbott CRT-D 7/24 -> QRS  - Repeat echo next visit  4. Partial R Nephrectomy - recent labs reviewed and stable - Scr 0.99 on 08/27/23 Checked personally   5. H/o CVA 11/24 - no significant residual - on Plavix and statin per Neuro  - No AF on device interrogation.  - <Low threshold for [facility]    Arvilla Meres MD 3:58 PM

## 2023-09-03 NOTE — Patient Instructions (Signed)
 Medication Changes:  RESTART Entresto 24/26 mg Twice daily   Testing/Procedures:  Your physician has requested that you have an echocardiogram. Echocardiography is a painless test that uses sound waves to create images of your heart. It provides your doctor with information about the size and shape of your heart and how well your heart's chambers and valves are working. This procedure takes approximately one hour. There are no restrictions for this procedure. Please do NOT wear cologne, perfume, aftershave, or lotions (deodorant is allowed). Please arrive 15 minutes prior to your appointment time. IN 2 MONTHS  Please note: We ask at that you not bring children with you during ultrasound (echo/ vascular) testing. Due to room size and safety concerns, children are not allowed in the ultrasound rooms during exams. Our front office staff cannot provide observation of children in our lobby area while testing is being conducted. An adult accompanying a patient to their appointment will only be allowed in the ultrasound room at the discretion of the ultrasound technician under special circumstances. We apologize for any inconvenience.  Special Instructions // Education:  Do the following things EVERYDAY: Weigh yourself in the morning before breakfast. Write it down and keep it in a log. Take your medicines as prescribed Eat low salt foods--Limit salt (sodium) to 2000 mg per day.  Stay as active as you can everyday Limit all fluids for the day to less than 2 liters   Follow-Up in: 2 months with an echocardiogram   At the [facility], you and your health needs are our priority. We have a designated team specialized in the treatment of Heart Failure. This Care Team includes your primary Heart Failure Specialized Cardiologist (physician), Advanced Practice Providers (APPs- Physician Assistants and Nurse Practitioners), and Pharmacist who all work together to provide you with the care  you need, when you need it.   You may see any of the following providers on your designated Care Team at your next follow up:  Dr. Arvilla Meres Dr. Marca Ancona Dr. Dorthula Nettles Dr. Theresia Bough Tonye Becket, NP Robbie Lis, [place] [facility] [place], [place] Brynda Peon, NP [place] Lee, NP Karle Plumber, PharmD   Please be sure to bring in all your medications bottles to every appointment.   Need to Contact us:  If you have any questions or concerns before your next appointment please send [place] a message through [place] or call our office at [phone].    TO LEAVE A MESSAGE FOR THE NURSE SELECT OPTION 2, PLEASE LEAVE A MESSAGE INCLUDING: YOUR NAME DATE OF BIRTH CALL BACK NUMBER REASON FOR CALL**this is important as we prioritize the call backs  YOU WILL RECEIVE A CALL BACK THE SAME DAY AS LONG AS YOU CALL BEFORE 4:00 PM

## 2023-09-10 ENCOUNTER — Encounter: Payer: Self-pay | Admitting: Internal Medicine

## 2023-09-12 ENCOUNTER — Ambulatory Visit

## 2023-09-12 ENCOUNTER — Other Ambulatory Visit: Payer: Self-pay

## 2023-09-12 ENCOUNTER — Inpatient Hospital Stay (HOSPITAL_COMMUNITY)
Admission: EM | Admit: 2023-09-12 | Discharge: 2023-09-17 | DRG: 193 | Disposition: A | Discharge status: Home / Self Care | Attending: Internal Medicine | Admitting: Internal Medicine

## 2023-09-12 ENCOUNTER — Encounter (HOSPITAL_COMMUNITY): Payer: Self-pay | Admitting: Emergency Medicine

## 2023-09-12 ENCOUNTER — Emergency Department (HOSPITAL_COMMUNITY)

## 2023-09-12 DIAGNOSIS — J440 Chronic obstructive pulmonary disease with acute lower respiratory infection: Secondary | ICD-10-CM | POA: Diagnosis present

## 2023-09-12 DIAGNOSIS — Y95 Nosocomial condition: Secondary | ICD-10-CM | POA: Diagnosis present

## 2023-09-12 DIAGNOSIS — Z881 Allergy status to other antibiotic agents status: Secondary | ICD-10-CM

## 2023-09-12 DIAGNOSIS — Z8249 Family history of ischemic heart disease and other diseases of the circulatory system: Secondary | ICD-10-CM | POA: Diagnosis not present

## 2023-09-12 DIAGNOSIS — Z905 Acquired absence of kidney: Secondary | ICD-10-CM | POA: Diagnosis not present

## 2023-09-12 DIAGNOSIS — I428 Other cardiomyopathies: Secondary | ICD-10-CM | POA: Diagnosis present

## 2023-09-12 DIAGNOSIS — R042 Hemoptysis: Secondary | ICD-10-CM | POA: Diagnosis present

## 2023-09-12 DIAGNOSIS — E785 Hyperlipidemia, unspecified: Secondary | ICD-10-CM | POA: Diagnosis present

## 2023-09-12 DIAGNOSIS — K219 Gastro-esophageal reflux disease without esophagitis: Secondary | ICD-10-CM | POA: Diagnosis present

## 2023-09-12 DIAGNOSIS — Z87891 Personal history of nicotine dependence: Secondary | ICD-10-CM

## 2023-09-12 DIAGNOSIS — Z95810 Presence of automatic (implantable) cardiac defibrillator: Secondary | ICD-10-CM

## 2023-09-12 DIAGNOSIS — D649 Anemia, unspecified: Secondary | ICD-10-CM | POA: Diagnosis present

## 2023-09-12 DIAGNOSIS — Z882 Allergy status to sulfonamides status: Secondary | ICD-10-CM

## 2023-09-12 DIAGNOSIS — I110 Hypertensive heart disease with heart failure: Secondary | ICD-10-CM | POA: Diagnosis present

## 2023-09-12 DIAGNOSIS — I5042 Chronic combined systolic (congestive) and diastolic (congestive) heart failure: Secondary | ICD-10-CM | POA: Diagnosis present

## 2023-09-12 DIAGNOSIS — Z79899 Other long term (current) drug therapy: Secondary | ICD-10-CM

## 2023-09-12 DIAGNOSIS — Z1152 Encounter for screening for COVID-19: Secondary | ICD-10-CM | POA: Diagnosis not present

## 2023-09-12 DIAGNOSIS — Z91040 Latex allergy status: Secondary | ICD-10-CM | POA: Diagnosis not present

## 2023-09-12 DIAGNOSIS — I252 Old myocardial infarction: Secondary | ICD-10-CM | POA: Diagnosis not present

## 2023-09-12 DIAGNOSIS — J9601 Acute respiratory failure with hypoxia: Secondary | ICD-10-CM | POA: Diagnosis present

## 2023-09-12 DIAGNOSIS — I447 Left bundle-branch block, unspecified: Secondary | ICD-10-CM | POA: Diagnosis present

## 2023-09-12 DIAGNOSIS — Z7982 Long term (current) use of aspirin: Secondary | ICD-10-CM | POA: Diagnosis not present

## 2023-09-12 DIAGNOSIS — Z8673 Personal history of transient ischemic attack (TIA), and cerebral infarction without residual deficits: Secondary | ICD-10-CM

## 2023-09-12 DIAGNOSIS — Z8719 Personal history of other diseases of the digestive system: Secondary | ICD-10-CM

## 2023-09-12 DIAGNOSIS — J189 Pneumonia, unspecified organism: Secondary | ICD-10-CM | POA: Diagnosis present

## 2023-09-12 DIAGNOSIS — K2950 Unspecified chronic gastritis without bleeding: Secondary | ICD-10-CM | POA: Diagnosis present

## 2023-09-12 DIAGNOSIS — E119 Type 2 diabetes mellitus without complications: Secondary | ICD-10-CM | POA: Diagnosis present

## 2023-09-12 DIAGNOSIS — Z888 Allergy status to other drugs, medicaments and biological substances status: Secondary | ICD-10-CM

## 2023-09-12 DIAGNOSIS — Z7984 Long term (current) use of oral hypoglycemic drugs: Secondary | ICD-10-CM | POA: Diagnosis not present

## 2023-09-12 DIAGNOSIS — Z7902 Long term (current) use of antithrombotics/antiplatelets: Secondary | ICD-10-CM

## 2023-09-12 DIAGNOSIS — I959 Hypotension, unspecified: Secondary | ICD-10-CM | POA: Diagnosis not present

## 2023-09-12 DIAGNOSIS — Z90710 Acquired absence of both cervix and uterus: Secondary | ICD-10-CM

## 2023-09-12 DIAGNOSIS — Z860101 Personal history of adenomatous and serrated colon polyps: Secondary | ICD-10-CM

## 2023-09-12 DIAGNOSIS — R531 Weakness: Secondary | ICD-10-CM

## 2023-09-12 DIAGNOSIS — Z9049 Acquired absence of other specified parts of digestive tract: Secondary | ICD-10-CM

## 2023-09-12 DIAGNOSIS — Z833 Family history of diabetes mellitus: Secondary | ICD-10-CM

## 2023-09-12 DIAGNOSIS — Z87442 Personal history of urinary calculi: Secondary | ICD-10-CM

## 2023-09-12 LAB — COMPREHENSIVE METABOLIC PANEL
ALT: 24 U/L (ref 0–44)
AST: 19 U/L (ref 15–41)
Albumin: 3.9 g/dL (ref 3.5–5.0)
Alkaline Phosphatase: 51 U/L (ref 38–126)
Anion gap: 13 (ref 5–15)
BUN: 14 mg/dL (ref 8–23)
CO2: 23 mmol/L (ref 22–32)
Calcium: 9.2 mg/dL (ref 8.9–10.3)
Chloride: 103 mmol/L (ref 98–111)
Creatinine, Ser: 0.95 mg/dL (ref 0.44–1.00)
GFR, Estimated: >60 mL/min (ref >60)
Glucose, Bld: 138 mg/dL — ABNORMAL HIGH (ref 70–99)
Potassium: 4 mmol/L (ref 3.5–5.1)
Sodium: 139 mmol/L (ref 135–145)
Total Bilirubin: 1.2 mg/dL (ref 0.0–1.2)
Total Protein: 7.4 g/dL (ref 6.5–8.1)

## 2023-09-12 LAB — CBC WITH DIFFERENTIAL/PLATELET
Abs Immature Granulocytes: 0.05 10*3/uL (ref 0.00–0.07)
Basophils Absolute: 0 10*3/uL (ref 0.0–0.1)
Basophils Relative: 0 %
Eosinophils Absolute: 0 10*3/uL (ref 0.0–0.5)
Eosinophils Relative: 0 %
HCT: 29 % — ABNORMAL LOW (ref 36.0–46.0)
Hemoglobin: 9 g/dL — ABNORMAL LOW (ref 12.0–15.0)
Immature Granulocytes: 1 %
Lymphocytes Relative: 7 %
Lymphs Abs: 0.7 10*3/uL (ref 0.7–4.0)
MCH: 28.1 pg (ref 26.0–34.0)
MCHC: 31 g/dL (ref 30.0–36.0)
MCV: 90.6 fL (ref 80.0–100.0)
Monocytes Absolute: 0.5 10*3/uL (ref 0.1–1.0)
Monocytes Relative: 5 %
Neutro Abs: 8.8 10*3/uL — ABNORMAL HIGH (ref 1.7–7.7)
Neutrophils Relative %: 87 %
Platelets: 228 10*3/uL (ref 150–400)
RBC: 3.2 MIL/uL — ABNORMAL LOW (ref 3.87–5.11)
RDW: 14.4 % (ref 11.5–15.5)
WBC: 10.1 10*3/uL (ref 4.0–10.5)
nRBC: 0 % (ref 0.0–0.2)

## 2023-09-12 LAB — TROPONIN I (HIGH SENSITIVITY)
Troponin I (High Sensitivity): 35 ng/L — ABNORMAL HIGH (ref <18)
Troponin I (High Sensitivity): 39 ng/L — ABNORMAL HIGH (ref <18)

## 2023-09-12 LAB — RESP PANEL BY RT-PCR (RSV, FLU A&B, COVID)  RVPGX2
Influenza A by PCR: NEGATIVE
Influenza B by PCR: NEGATIVE
Resp Syncytial Virus by PCR: NEGATIVE
SARS Coronavirus 2 by RT PCR: NEGATIVE

## 2023-09-12 LAB — URINALYSIS, ROUTINE W REFLEX MICROSCOPIC
Bacteria, UA: NONE SEEN
Bilirubin Urine: NEGATIVE
Glucose, UA: NEGATIVE mg/dL
Hgb urine dipstick: NEGATIVE
Ketones, ur: NEGATIVE mg/dL
Nitrite: NEGATIVE
Protein, ur: NEGATIVE mg/dL
Specific Gravity, Urine: 1.034 — ABNORMAL HIGH (ref 1.005–1.030)
pH: 5 (ref 5.0–8.0)

## 2023-09-12 LAB — MRSA NEXT GEN BY PCR, NASAL: MRSA by PCR Next Gen: NOT DETECTED

## 2023-09-12 LAB — BRAIN NATRIURETIC PEPTIDE: B Natriuretic Peptide: 1151.1 pg/mL — ABNORMAL HIGH (ref 0.0–100.0)

## 2023-09-12 LAB — DIGOXIN LEVEL: Digoxin Level: 0.4 ng/mL — ABNORMAL LOW (ref 0.8–2.0)

## 2023-09-12 LAB — GLUCOSE, CAPILLARY
Glucose-Capillary: 89 mg/dL (ref 70–99)
Glucose-Capillary: 141 mg/dL — ABNORMAL HIGH (ref 70–99)

## 2023-09-12 LAB — LACTIC ACID, PLASMA: Lactic Acid, Venous: 1 mmol/L (ref 0.5–1.9)

## 2023-09-12 LAB — CBG MONITORING, ED
Glucose-Capillary: 121 mg/dL — ABNORMAL HIGH (ref 70–99)
Glucose-Capillary: 119 mg/dL — ABNORMAL HIGH (ref 70–99)

## 2023-09-12 MED ORDER — ENOXAPARIN SODIUM 40 MG/0.4ML IJ SOSY
40.0000 mg | PREFILLED_SYRINGE | INTRAMUSCULAR | Status: DC
  Administered 2023-09-12 – 2023-09-17 (×6): 40 mg via SUBCUTANEOUS
  Filled 2023-09-12 (×6): qty 0.4

## 2023-09-12 MED ORDER — TRAZODONE HCL 50 MG PO TABS
50.0000 mg | ORAL_TABLET | Freq: Every evening | ORAL | Status: DC | PRN
  Administered 2023-09-13 – 2023-09-16 (×2): 50 mg via ORAL
  Filled 2023-09-12 (×2): qty 1

## 2023-09-12 MED ORDER — VANCOMYCIN HCL 2000 MG/400ML IV SOLN
2000.0000 mg | INTRAVENOUS | Status: DC
  Filled 2023-09-12: qty 400

## 2023-09-12 MED ORDER — VANCOMYCIN HCL 2000 MG/400ML IV SOLN
2000.0000 mg | Freq: Once | INTRAVENOUS | Status: AC
  Administered 2023-09-12: 2000 mg via INTRAVENOUS
  Filled 2023-09-12: qty 400

## 2023-09-12 MED ORDER — INSULIN ASPART 100 UNIT/ML IJ SOLN
0.0000 [IU] | Freq: Three times a day (TID) | INTRAMUSCULAR | Status: DC
  Administered 2023-09-12 – 2023-09-13 (×2): 2 [IU] via SUBCUTANEOUS
  Administered 2023-09-14: 5 [IU] via SUBCUTANEOUS
  Administered 2023-09-15 – 2023-09-16 (×2): 3 [IU] via SUBCUTANEOUS
  Filled 2023-09-12: qty 0.15

## 2023-09-12 MED ORDER — ONDANSETRON HCL 4 MG/2ML IJ SOLN
4.0000 mg | Freq: Four times a day (QID) | INTRAMUSCULAR | Status: DC | PRN
  Administered 2023-09-13 – 2023-09-16 (×2): 4 mg via INTRAVENOUS
  Filled 2023-09-12 (×2): qty 2

## 2023-09-12 MED ORDER — SACUBITRIL-VALSARTAN 24-26 MG PO TABS
1.0000 | ORAL_TABLET | Freq: Two times a day (BID) | ORAL | Status: DC
Start: 2023-09-12 — End: 2023-09-13
  Administered 2023-09-12: 1 via ORAL
  Filled 2023-09-12 (×3): qty 1

## 2023-09-12 MED ORDER — INSULIN ASPART 100 UNIT/ML IJ SOLN
0.0000 [IU] | Freq: Every day | INTRAMUSCULAR | Status: DC
  Filled 2023-09-12: qty 0.05

## 2023-09-12 MED ORDER — SODIUM CHLORIDE 0.9 % IV BOLUS
250.0000 mL | Freq: Once | INTRAVENOUS | Status: AC
  Administered 2023-09-12: 250 mL via INTRAVENOUS

## 2023-09-12 MED ORDER — VANCOMYCIN HCL IN DEXTROSE 1-5 GM/200ML-% IV SOLN: 1000.0000 mg | Freq: Once | INTRAVENOUS | Status: DC

## 2023-09-12 MED ORDER — SODIUM CHLORIDE 0.9 % IV SOLN
2.0000 g | Freq: Three times a day (TID) | INTRAVENOUS | Status: DC
  Administered 2023-09-12 – 2023-09-17 (×16): 2 g via INTRAVENOUS
  Filled 2023-09-12 (×16): qty 12.5

## 2023-09-12 MED ORDER — ONDANSETRON HCL 4 MG PO TABS: 4.0000 mg | ORAL_TABLET | Freq: Four times a day (QID) | ORAL | Status: DC | PRN

## 2023-09-12 MED ORDER — CLOPIDOGREL BISULFATE 75 MG PO TABS
75.0000 mg | ORAL_TABLET | Freq: Every day | ORAL | Status: DC
  Administered 2023-09-12 – 2023-09-17 (×6): 75 mg via ORAL
  Filled 2023-09-12 (×6): qty 1

## 2023-09-12 MED ORDER — DIGOXIN 125 MCG PO TABS
125.0000 ug | ORAL_TABLET | Freq: Every day | ORAL | Status: DC
  Administered 2023-09-12 – 2023-09-17 (×6): 125 ug via ORAL
  Filled 2023-09-12 (×7): qty 1

## 2023-09-12 MED ORDER — ACETAMINOPHEN 650 MG RE SUPP: 650.0000 mg | Freq: Four times a day (QID) | RECTAL | Status: DC | PRN

## 2023-09-12 MED ORDER — BISOPROLOL FUMARATE 5 MG PO TABS
2.5000 mg | ORAL_TABLET | Freq: Every day | ORAL | Status: DC
  Administered 2023-09-12 – 2023-09-17 (×6): 2.5 mg via ORAL
  Filled 2023-09-12 (×3): qty 1
  Filled 2023-09-12: qty 0.5
  Filled 2023-09-12 (×2): qty 1

## 2023-09-12 MED ORDER — IOHEXOL 350 MG/ML SOLN
75.0000 mL | Freq: Once | INTRAVENOUS | Status: AC | PRN
  Administered 2023-09-12: 75 mL via INTRAVENOUS

## 2023-09-12 MED ORDER — SPIRONOLACTONE 25 MG PO TABS
25.0000 mg | ORAL_TABLET | Freq: Every day | ORAL | Status: DC
  Administered 2023-09-12 – 2023-09-17 (×6): 25 mg via ORAL
  Filled 2023-09-12 (×6): qty 1

## 2023-09-12 MED ORDER — SODIUM CHLORIDE 0.9 % IV SOLN
2.0000 g | Freq: Once | INTRAVENOUS | Status: AC
  Administered 2023-09-12: 2 g via INTRAVENOUS
  Filled 2023-09-12: qty 12.5

## 2023-09-12 MED ORDER — ALBUTEROL SULFATE (2.5 MG/3ML) 0.083% IN NEBU
2.5000 mg | INHALATION_SOLUTION | RESPIRATORY_TRACT | Status: DC | PRN
  Filled 2023-09-12 (×2): qty 3

## 2023-09-12 MED ORDER — ENSURE ENLIVE PO LIQD
237.0000 mL | Freq: Two times a day (BID) | ORAL | Status: DC
  Administered 2023-09-13 – 2023-09-17 (×9): 237 mL via ORAL

## 2023-09-12 MED ORDER — ONDANSETRON HCL 4 MG/2ML IJ SOLN
4.0000 mg | Freq: Once | INTRAMUSCULAR | Status: AC
  Administered 2023-09-12: 4 mg via INTRAVENOUS
  Filled 2023-09-12: qty 2

## 2023-09-12 MED ORDER — ACETAMINOPHEN 325 MG PO TABS
650.0000 mg | ORAL_TABLET | Freq: Four times a day (QID) | ORAL | Status: DC | PRN
  Administered 2023-09-12 – 2023-09-16 (×4): 650 mg via ORAL
  Filled 2023-09-12 (×4): qty 2

## 2023-09-12 MED ORDER — SODIUM CHLORIDE (PF) 0.9 % IJ SOLN
INTRAMUSCULAR | Status: AC
  Filled 2023-09-12: qty 50

## 2023-09-12 NOTE — ED Notes (Signed)
 ED TO INPATIENT HANDOFF REPORT  ED Nurse Name and Phone #: Pennelope Bracken EMTP  S Name/Age/Gender Lynn Estrada 63 y.o. female Room/Bed: WOTF/NONE  Code Status   Code Status: Full Code  Home/SNF/Other Home Patient oriented to: self, place, time, and situation Is this baseline? Yes   Triage Complete: Triage complete  Chief Complaint HCAP (healthcare-associated pneumonia) [J18.9]  Triage Note Patient recently admitted for cough, fatigue and body aches.  Patient also endorses n/v/d.  Patient states this feels like she has pna again.    Allergies Allergies  Allergen Reactions   Latex Rash and Hives   Bactrim [Sulfamethoxazole-Trimethoprim] Rash   Carvedilol Itching   Metoprolol Itching and Rash    Level of Care/Admitting Diagnosis ED Disposition     ED Disposition  Admit   Condition  --   Comment  Hospital Area: [facility]  HOSPITAL [[id number]]  Level of Care: Progressive [102]  Admit to Progressive based on following criteria: MULTISYSTEM THREATS such as stable sepsis, metabolic/electrolyte imbalance with or without encephalopathy that is responding to early treatment.  May admit patient to [facility] or [facility] if equivalent level of care is available:: Yes  Covid Evaluation: Confirmed COVID Negative  Diagnosis: HCAP (healthcare-associated pneumonia) [[id number]]  Admitting Physician: Maryln Gottron [[id number]]  Attending Physician: Kirby Crigler, MIR [email or web address] [[id number]]  Certification:: I certify this patient will need inpatient services for at least 2 midnights  Expected Medical Readiness: 09/16/2023          B Medical/Surgery History Past Medical History:  Diagnosis Date   Anemia    Arthritis    CHF (congestive heart failure) (HCC) 12/06/2022   EF is 20% to 25 %   COPD (chronic obstructive pulmonary disease) (HCC)    Diabetes mellitus without complication (HCC)    Eczema    GERD (gastroesophageal reflux disease)    History of adenomatous polyp of colon     08-03-2016  tubular adenoma x2 and hyperplastic   History of chronic gastritis 08/03/2016   History of ectopic pregnancy 1988   s/p  left salpingectomy   History of endometriosis    History of kidney stones    History of partial nephrectomy    right pelvis for very large stone   History of pneumonia 07/02/2016   CAP   History of sepsis    07-01-2016  sepsis w/ pyelonephritis, CAP /   12-31-2016  urosepsis w/ kidney stone obstruction   History of uterine leiomyoma    Hx of adenomatous colonic polyps 08/03/2016   2 adenomas, 1 hpp and 1 lost polyp 2018 all < 1 cm    Hyperlipidemia    Hypertension    Left ureteral stone    Myocardial infarction [facility])    Nephrolithiasis    left obstructive stone and right non-obstructive stone  per CT 12-31-2016   Urgency of urination    Past Surgical History:  Procedure Laterality Date   ABDOMINAL HYSTERECTOMY  01/20/2007   w/ Lysis Adhesions/  Left salpingoophorectomy/  Right Salpingectomy   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   Procedure: BIV ICD INSERTION CRT-D;  Surgeon: Maurice Small, MD;  [facility]: [facility];  Service: Cardiovascular;  Laterality: N/A;   CHOLECYSTECTOMY  1994   and Right Partial Nephrectomy (pelvis for very large stone)   COLONOSCOPY WITH PROPOFOL N/A 03/26/2023   Procedure: COLONOSCOPY WITH PROPOFOL;  Surgeon: Iva Boop, MD;  Location: [facility];  Service: Gastroenterology;  Laterality: N/A;   CYSTOSCOPY W/  URETERAL STENT PLACEMENT Left 12/31/2016   Procedure: CYSTOSCOPY WITH RETROGRADE PYELOGRAM/ LEFT URETERAL STENT PLACEMENT;  Surgeon: Sebastian Ache, MD;  Location: [facility];  Service: Urology;  Laterality: Left;   CYSTOSCOPY WITH RETROGRADE PYELOGRAM, URETEROSCOPY AND STENT PLACEMENT Left 01/18/2017   Procedure: 1ST STAGE CYSTOSCOPY WITH RETROGRADE PYELOGRAM, URETEROSCOPY AND STENT REPLACEMENT;  Surgeon: Sebastian Ache, MD;  Location: [facility];  Service: Urology;  Laterality: Left;    CYSTOSCOPY WITH RETROGRADE PYELOGRAM, URETEROSCOPY AND STENT PLACEMENT Left 02/01/2017   Procedure: 2ND STAGE CYSTOSCOPY WITH RETROGRADE PYELOGRAM, URETEROSCOPY AND STENT REPLACEMENT;  Surgeon: Sebastian Ache, MD;  Location: [facility];  Service: Urology;  Laterality: Left;   ECTOPIC PREGNANCY SURGERY  1988   Left Salpingectomy   HOLMIUM LASER APPLICATION Left 01/18/2017   Procedure: HOLMIUM LASER APPLICATION;  Surgeon: Sebastian Ache, MD;  Location: [facility];  Service: Urology;  Laterality: Left;   HOLMIUM LASER APPLICATION Left 02/01/2017   Procedure: HOLMIUM LASER APPLICATION;  Surgeon: Sebastian Ache, MD;  Location: [facility];  Service: Urology;  Laterality: Left;   LUMBAR DISC SURGERY  01/1999   right L5 -- S1   POLYPECTOMY  03/26/2023   Procedure: POLYPECTOMY;  Surgeon: Iva Boop, MD;  Location: [facility];  Service: Gastroenterology;;   RE-EXPLORATION LUMBAR/  LAMINECTOMY AND MICRODISKECTOMY  10/14/2001   right L5 -- S1   RIGHT HEART CATH N/A 05/23/2022   Procedure: RIGHT HEART CATH;  Surgeon: Dolores Patty, MD;  [facility];  Service: Cardiovascular;  Laterality: N/A;   RIGHT/LEFT HEART CATH AND CORONARY ANGIOGRAPHY N/A 07/18/2021   Procedure: RIGHT/LEFT HEART CATH AND CORONARY ANGIOGRAPHY;  Surgeon: Dolores Patty, MD;  Location: [facility];  Service: Cardiovascular;  Laterality: N/A;   TUBAL LIGATION Bilateral 10/17/1999   PPTL   UMBILICAL HERNIA REPAIR  age 65     A IV Location/Drains/Wounds Patient Lines/Drains/Airways Status     Active Line/Drains/Airways     Name Placement date Placement time Site Days   Peripheral IV 09/12/23 20 G 1" Anterior;Distal;Left Forearm 09/12/23  0503  Forearm  less than 1   Ureteral Drain/Stent Left ureter 6 Fr. 02/01/17  0916  Left ureter  2414            Intake/Output Last 24 hours No intake or output data in the 24 hours ending 09/12/23  1556  Labs/Imaging Results for orders placed or performed during the hospital encounter of 09/12/23 (from the past 48 hours)  Comprehensive metabolic panel     Status: Abnormal   Collection Time: 09/12/23  4:54 AM  Result Value Ref Range   Sodium 139 135 - 145 mmol/L   Potassium 4.0 3.5 - 5.1 mmol/L   Chloride 103 98 - 111 mmol/L   CO2 23 22 - 32 mmol/L   Glucose, Bld 138 (H) 70 - 99 mg/dL    Comment: Glucose reference range applies only to samples taken after fasting for at least 8 hours.   BUN 14 8 - 23 mg/dL   Creatinine, Ser 9.14 0.44 - 1.00 mg/dL   Calcium 9.2 8.9 - 78.2 mg/dL   Total Protein 7.4 6.5 - 8.1 g/dL   Albumin 3.9 3.5 - 5.0 g/dL   AST 19 15 - 41 U/L   ALT 24 0 - 44 U/L   Alkaline Phosphatase 51 38 - 126 U/L   Total Bilirubin 1.2 0.0 - 1.2 mg/dL   GFR, Estimated >95 >62 mL/min  Comment: (NOTE) Calculated using the CKD-EPI Creatinine Equation (2021)    Anion gap 13 5 - 15    Comment: Performed at [facility], [address] [place], [place] [address]  Troponin I (High Sensitivity)     Status: Abnormal   Collection Time: 09/12/23  4:54 AM  Result Value Ref Range   Troponin I (High Sensitivity) 35 (H) <18 ng/L    Comment: (NOTE) Elevated high sensitivity troponin I (hsTnI) values and significant  changes across serial measurements may suggest ACS but many other  chronic and acute conditions are known to elevate hsTnI results.  Refer to the "Links" section for chest pain algorithms and additional  guidance. Performed at [facility], [address] [place], [place] [address]   Resp panel by RT-PCR (RSV, Flu A&B, Covid) Anterior Nasal Swab     Status: None   Collection Time: 09/12/23  4:54 AM   Specimen: Anterior Nasal Swab  Result Value Ref Range   SARS Coronavirus 2 by RT PCR NEGATIVE NEGATIVE    Comment: (NOTE) SARS-CoV-2 target nucleic acids are NOT DETECTED.  The SARS-CoV-2 RNA is generally detectable in upper  respiratory specimens during the acute phase of infection. The lowest concentration of SARS-CoV-2 viral copies this assay can detect is 138 copies/mL. A negative result does not preclude SARS-Cov-2 infection and should not be used as the sole basis for treatment or other patient management decisions. A negative result may occur with  improper specimen collection/handling, submission of specimen other than nasopharyngeal swab, presence of viral mutation(s) within the areas targeted by this assay, and inadequate number of viral copies(<138 copies/mL). A negative result must be combined with clinical observations, patient history, and epidemiological information. The expected result is Negative.  Fact Sheet for Patients:  BloggerCourse.com  Fact Sheet for Healthcare Providers:  SeriousBroker.it  This test is no t yet approved or cleared by the [place] FDA and  has been authorized for detection and/or diagnosis of SARS-CoV-2 by FDA under an Emergency Use Authorization (EUA). This EUA will remain  in effect (meaning this test can be used) for the duration of the COVID-19 declaration under Section 564(b)(1) of the Act, 21 U.S.C.section 360bbb-3(b)(1), unless the authorization is terminated  or revoked sooner.       Influenza A by PCR NEGATIVE NEGATIVE   Influenza B by PCR NEGATIVE NEGATIVE    Comment: (NOTE) The Xpert Xpress SARS-CoV-2/FLU/RSV plus assay is intended as an aid in the diagnosis of influenza from Nasopharyngeal swab specimens and should not be used as a sole basis for treatment. Nasal washings and aspirates are unacceptable for Xpert Xpress SARS-CoV-2/FLU/RSV testing.  Fact Sheet for Patients: BloggerCourse.com  Fact Sheet for Healthcare Providers: SeriousBroker.it  This test is not yet approved or cleared by the [place] FDA and has been authorized for  detection and/or diagnosis of SARS-CoV-2 by FDA under an Emergency Use Authorization (EUA). This EUA will remain in effect (meaning this test can be used) for the duration of the COVID-19 declaration under Section 564(b)(1) of the Act, 21 U.S.C. section 360bbb-3(b)(1), unless the authorization is terminated or revoked.     Resp Syncytial Virus by PCR NEGATIVE NEGATIVE    Comment: (NOTE) Fact Sheet for Patients: BloggerCourse.com  Fact Sheet for Healthcare Providers: SeriousBroker.it  This test is not yet approved or cleared by the [place] FDA and has been authorized for detection and/or diagnosis of SARS-CoV-2 by FDA under an Emergency Use Authorization (EUA). This EUA  will remain in effect (meaning this test can be used) for the duration of the COVID-19 declaration under Section 564(b)(1) of the Act, 21 U.S.C. section 360bbb-3(b)(1), unless the authorization is terminated or revoked.  Performed at [facility], [address], [place], [place] [address]   Digoxin level     Status: Abnormal   Collection Time: 09/12/23  4:54 AM  Result Value Ref Range   Digoxin Level 0.4 (L) 0.8 - 2.0 ng/mL    Comment: Performed at [facility], [address] [place], [place] [address]  Brain natriuretic peptide     Status: Abnormal   Collection Time: 09/12/23  5:50 AM  Result Value Ref Range   B Natriuretic Peptide 1,151.1 (H) 0.0 - 100.0 pg/mL    Comment: Performed at [facility], [address], [place], [place] [address]  CBC with Differential/Platelet     Status: Abnormal   Collection Time: 09/12/23  5:50 AM  Result Value Ref Range   WBC 10.1 4.0 - 10.5 K/uL   RBC 3.20 (L) 3.87 - 5.11 MIL/uL   Hemoglobin 9.0 (L) 12.0 - 15.0 g/dL   HCT 21.3 (L) 08.6 - 57.8 %   MCV 90.6 80.0 - 100.0 fL   MCH 28.1 26.0 - 34.0 pg   MCHC 31.0 30.0 - 36.0 g/dL   RDW 46.9 62.9 - 52.8 %    Platelets 228 150 - 400 K/uL   nRBC 0.0 0.0 - 0.2 %   Neutrophils Relative % 87 %   Neutro Abs 8.8 (H) 1.7 - 7.7 K/uL   Lymphocytes Relative 7 %   Lymphs Abs 0.7 0.7 - 4.0 K/uL   Monocytes Relative 5 %   Monocytes Absolute 0.5 0.1 - 1.0 K/uL   Eosinophils Relative 0 %   Eosinophils Absolute 0.0 0.0 - 0.5 K/uL   Basophils Relative 0 %   Basophils Absolute 0.0 0.0 - 0.1 K/uL   RBC Morphology MORPHOLOGY UNREMARKABLE    Smear Review MORPHOLOGY UNREMARKABLE    Immature Granulocytes 1 %   Abs Immature Granulocytes 0.05 0.00 - 0.07 K/uL   Reactive, Benign Lymphocytes PRESENT     Comment: Performed at [facility], [address], [place], [place] [address]  Lactic acid, plasma     Status: None   Collection Time: 09/12/23  5:53 AM  Result Value Ref Range   Lactic Acid, Venous 1.0 0.5 - 1.9 mmol/L    Comment: Performed at [facility], [address] [place], [place] [address]  MRSA Next Gen by PCR, Nasal     Status: None   Collection Time: 09/12/23  6:01 AM   Specimen: Nasal Mucosa; Nasal Swab  Result Value Ref Range   MRSA by PCR Next Gen NOT DETECTED NOT DETECTED    Comment: (NOTE) The GeneXpert MRSA Assay (FDA approved for NASAL specimens only), is one component of a comprehensive MRSA colonization surveillance program. It is not intended to diagnose MRSA infection nor to guide or monitor treatment for MRSA infections. Test performance is not FDA approved in patients less than 28 years old. Performed at [facility], [address] [place], [place] [address]   Troponin I (High Sensitivity)     Status: Abnormal   Collection Time: 09/12/23  6:07 AM  Result Value Ref Range   Troponin I (High Sensitivity) 39 (H) <18 ng/L    Comment: (NOTE) Elevated high sensitivity troponin I (hsTnI) values and significant  changes  across serial measurements may suggest ACS but many other  chronic and acute conditions are known to elevate  hsTnI results.  Refer to the "Links" section for chest pain algorithms and additional  guidance. Performed at [facility], [address] [place], [place] [address]   CBG monitoring, ED     Status: Abnormal   Collection Time: 09/12/23  8:18 AM  Result Value Ref Range   Glucose-Capillary 121 (H) 70 - 99 mg/dL    Comment: Glucose reference range applies only to samples taken after fasting for at least 8 hours.  CBG monitoring, ED     Status: Abnormal   Collection Time: 09/12/23  1:19 PM  Result Value Ref Range   Glucose-Capillary 119 (H) 70 - 99 mg/dL    Comment: Glucose reference range applies only to samples taken after fasting for at least 8 hours.   CT Angio Chest PE W and/or Wo Contrast Result Date: 09/12/2023 CLINICAL DATA:  Recent admission for cough, fatigue, and body aches. Pulmonary embolism suspected, high probability. EXAM: CT ANGIOGRAPHY CHEST WITH CONTRAST TECHNIQUE: Multidetector CT imaging of the chest was performed using the standard protocol during bolus administration of intravenous contrast. Multiplanar CT image reconstructions and MIPs were obtained to evaluate the vascular anatomy. RADIATION DOSE REDUCTION: This exam was performed according to the departmental dose-optimization program which includes automated exposure control, adjustment of the mA and/or kV according to patient size and/or use of iterative reconstruction technique. CONTRAST:  75mL OMNIPAQUE IOHEXOL 350 MG/ML SOLN COMPARISON:  Preceding chest radiograph. FINDINGS: Cardiovascular: Satisfactory opacification of the pulmonary arteries to the segmental level. No evidence of pulmonary embolism. Enlarged heart size with biventricular pacer in stable position by report preceding radiograph. No significant pericardial effusion. There is atheromatous calcification of the aorta and left main. Enlarged main pulmonary artery at 3.6 cm. This usually reflects pulmonary hypertension. Mediastinum/Nodes:  Mild thickening of mediastinal lymph nodes which is likely reactive in this setting. Lungs/Pleura: Bilateral airspace disease affecting all lobes. There is some interlobular septal thickening and alveolar edema is considered but history and patchy distribution is more suggestive of pneumonia. Upper Abdomen: Atheromatous calcification. Musculoskeletal: Thoracic spine degeneration. Review of the MIP images confirms the above findings. IMPRESSION: 1. Multi lobar pneumonia. 2. No pulmonary embolism. 3. Chronic marked cardiomegaly. Electronically Signed   By: Tiburcio Pea M.D.   On: 09/12/2023 06:24   DG Chest 2 View Result Date: 09/12/2023 CLINICAL DATA:  Shortness of breath and cough EXAM: CHEST - 2 VIEW COMPARISON:  08/23/2023 FINDINGS: Bilateral airspace opacity similar to prior. Cardiopericardial enlargement with stable positioning of biventricular pacer from the left. No visible effusion or pneumothorax. IMPRESSION: Bilateral airspace disease similar to comparison last month, focally dense appearance favoring pneumonia over alveolar edema. Chronic cardiomegaly. Electronically Signed   By: Tiburcio Pea M.D.   On: 09/12/2023 04:49    Pending Labs Unresulted Labs (From admission, onward)     Start     Ordered   09/13/23 0500  Basic metabolic panel  Tomorrow morning,   R        09/12/23 0756   09/13/23 0500  CBC  Tomorrow morning,   R        09/12/23 0756   09/12/23 0553  Blood culture (routine x 2)  BLOOD CULTURE X 2,   R      09/12/23 0552   09/12/23 0411  Urinalysis, Routine w reflex microscopic -Urine, Clean Catch  Once,   URGENT  Question:  Specimen Source  Answer:  Urine, Clean Catch   09/12/23 0410   09/12/23 0407  CBC with Differential  Once,   STAT        09/12/23 0406            Vitals/Pain Today's Vitals   09/12/23 1100 09/12/23 1150 09/12/23 1300 09/12/23 1553  BP: 92/66  105/87   Pulse: 93  88   Resp: 18  16   Temp:  98.3 F (36.8 C)  100 F (37.8 C)   TempSrc:  Oral  Oral  SpO2: 96%  98%   Weight:      PainSc:        Isolation Precautions No active isolations  Medications Medications  bisoprolol (ZEBETA) tablet 2.5 mg (2.5 mg Oral Given by Other 09/12/23 1000)  digoxin (LANOXIN) tablet 125 mcg (125 mcg Oral Given by Other 09/12/23 1001)  sacubitril-valsartan (ENTRESTO) 24-26 mg per tablet (1 tablet Oral Given by Other 09/12/23 1001)  clopidogrel (PLAVIX) tablet 75 mg (75 mg Oral Given by Other 09/12/23 1001)  enoxaparin (LOVENOX) injection 40 mg (40 mg Subcutaneous Given 09/12/23 0835)  insulin aspart (novoLOG) injection 0-15 Units ( Subcutaneous Not Given 09/12/23 1343)  insulin aspart (novoLOG) injection 0-5 Units (has no administration in time range)  acetaminophen (TYLENOL) tablet 650 mg (has no administration in time range)    Or  acetaminophen (TYLENOL) suppository 650 mg (has no administration in time range)  traZODone (DESYREL) tablet 50 mg (has no administration in time range)  ondansetron (ZOFRAN) tablet 4 mg (has no administration in time range)    Or  ondansetron (ZOFRAN) injection 4 mg (has no administration in time range)  albuterol (PROVENTIL) (2.5 MG/3ML) 0.083% nebulizer solution 2.5 mg (0 mg Nebulization Hold 09/12/23 1415)  spironolactone (ALDACTONE) tablet 25 mg (25 mg Oral Given by Other 09/12/23 1000)  ceFEPIme (MAXIPIME) 2 g in sodium chloride 0.9 % 100 mL IVPB (2 g Intravenous New Bag/Given 09/12/23 1410)  vancomycin (VANCOREADY) IVPB 2000 mg/400 mL (has no administration in time range)  sodium chloride 0.9 % bolus 250 mL (0 mLs Intravenous Stopped 09/12/23 0618)  ceFEPIme (MAXIPIME) 2 g in sodium chloride 0.9 % 100 mL IVPB (0 g Intravenous Stopped 09/12/23 0712)  iohexol (OMNIPAQUE) 350 MG/ML injection 75 mL (75 mLs Intravenous Contrast Given 09/12/23 0600)  vancomycin (VANCOREADY) IVPB 2000 mg/400 mL (0 mg Intravenous Stopped 09/12/23 0922)  ondansetron (ZOFRAN) injection 4 mg (4 mg Intravenous Given 09/12/23 0725)     Mobility walks     Focused Assessments See Chart   R Recommendations: See Admitting Provider Note  Report given to:

## 2023-09-12 NOTE — ED Provider Notes (Signed)
 [place] EMERGENCY DEPARTMENT AT [facility] Provider Note   CSN: [id number] Arrival date & time: 09/12/23  4098     History  Chief Complaint  Patient presents with   Pneumonia    Lynn Estrada is a 63 y.o. female.  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  States she started feeling ill about 24 hours ago with nausea, vomiting, cough, body aches, chills and fever.  Tmax 100.9.  Feels achy all over.  She is concerned she could have pneumonia again.  Has had multiple episodes of nausea and vomiting today not able to keep anything down including her medications.  Denies diarrhea.  Denies any black or bloody stools.  Denies any blood in her emesis but has seen some specks of blood in her sputum.  Believes she takes a blood thinner but does not know what.  No travel or sick contacts.  No significant abdominal pain, headache, chest pain but does feel short of breath.  She is concerned she could have pneumonia again and feels like she is dehydrated.  Does feel dizzy and lightheaded as well.  Echo 11/24 EF 20-25%   The history is provided by the patient.  Pneumonia Associated symptoms include shortness of breath. Pertinent negatives include no headaches.       Home Medications Prior to Admission medications   Medication Sig Start Date End Date Taking? Authorizing Provider  acetaminophen (TYLENOL) 500 MG tablet Take 500-1,000 mg by mouth every 6 (six) hours as needed (for pain.).    [provider]  aspirin EC 81 MG tablet Take 81 mg by mouth daily. Swallow whole. Patient not taking: Reported on 09/03/2023    [provider]  atorvastatin (LIPITOR) 10 MG tablet Take 10 mg by mouth in the morning. 05/15/21   [provider]  bisoprolol (ZEBETA) 5 MG tablet Take 0.5 tablets (2.5 mg total) by mouth daily. 07/19/23    Bensimhon, Bevelyn Buckles, MD  Calcium Carbonate Antacid (TUMS E-X PO) Take 1 tablet by mouth daily.    [provider]  clopidogrel (PLAVIX) 75 MG tablet Take 1 tablet (75 mg total) by mouth daily. 06/24/23 06/18/24  Butch Penny, NP  digoxin (LANOXIN) 0.125 MG tablet Take 1 tablet (125 mcg total) by mouth daily. 09/03/23   Bensimhon, Bevelyn Buckles, MD  furosemide (LASIX) 20 MG tablet Take 1 tablet (20 mg total) by mouth daily. 08/27/23 08/26/24  Alwyn Ren, MD  metFORMIN (GLUCOPHAGE) 500 MG tablet Take 1 tablet (500 mg total) by mouth 2 (two) times daily with a meal. 05/27/23 09/02/24  Tawkaliyar, Roya, DO  Multiple Vitamin (MULTIVITAMIN WITH MINERALS) TABS tablet Take 1 tablet by mouth in the morning. One A Day for Women 50+    [provider]  sacubitril-valsartan (ENTRESTO) 24-26 MG Take 1 tablet by mouth 2 (two) times daily. 09/03/23   Bensimhon, Bevelyn Buckles, MD      Allergies    Latex, Bactrim [sulfamethoxazole-trimethoprim], Carvedilol, and Metoprolol    Review of Systems   Review of Systems  Constitutional:  Positive for activity change, appetite change, fatigue and fever.  HENT:  Positive for congestion.   Respiratory:  Positive for cough and shortness of breath.   Gastrointestinal:  Positive for nausea and vomiting.  Genitourinary:  Negative for dysuria and hematuria.  Musculoskeletal:  Positive for arthralgias and myalgias.  Skin:  Negative for rash.  Neurological:  Positive for weakness. Negative for dizziness and headaches.   all other systems are negative except as noted in the HPI and PMH.    Physical Exam Updated Vital Signs BP (!) 134/95   Pulse (!) 109   Temp 98.9 F (37.2 C) (Oral)   Resp 20   Wt 92 kg   SpO2 96%   BMI 26.76 kg/m  Physical Exam Vitals and nursing note reviewed.  Constitutional:      General: She is not in acute distress.    Appearance: She is well-developed. She is ill-appearing.  HENT:     Head: Normocephalic and atraumatic.      Mouth/Throat:     Pharynx: No oropharyngeal exudate.  Eyes:     Conjunctiva/sclera: Conjunctivae normal.     Pupils: Pupils are equal, round, and reactive to light.  Neck:     Comments: No meningismus. Cardiovascular:     Rate and Rhythm: Regular rhythm. Tachycardia present.     Heart sounds: Normal heart sounds. No murmur heard. Pulmonary:     Effort: Pulmonary effort is normal. No respiratory distress.     Breath sounds: Normal breath sounds.  Abdominal:     Palpations: Abdomen is soft.     Tenderness: There is no abdominal tenderness. There is no guarding or rebound.  Musculoskeletal:        General: No tenderness. Normal range of motion.     Cervical back: Normal range of motion and neck supple.  Skin:    General: Skin is warm.  Neurological:     Mental Status: She is alert and oriented to person, place, and time.     Cranial Nerves: No cranial nerve deficit.     Motor: No abnormal muscle tone.     Coordination: Coordination normal.     Comments:  5/5 strength throughout. CN 2-12 intact.Equal grip strength.   Psychiatric:        Behavior: Behavior normal.     ED Results / Procedures / Treatments   Labs (all labs ordered are listed, but only abnormal results are displayed) Labs Reviewed  COMPREHENSIVE METABOLIC PANEL - Abnormal; Notable for the following components:      Result Value   Glucose, Bld 138 (*)    All other components within normal limits  DIGOXIN LEVEL - Abnormal; Notable for the following components:   Digoxin Level 0.4 (*)    All other components within normal limits  CBC WITH DIFFERENTIAL/PLATELET - Abnormal; Notable for the following components:   RBC 3.20 (*)    Hemoglobin 9.0 (*)    HCT 29.0 (*)    Neutro Abs 8.8 (*)    All other components within normal limits  TROPONIN I (HIGH SENSITIVITY) - Abnormal; Notable for the following components:   Troponin I (High Sensitivity) 35 (*)    All other components within normal limits  RESP PANEL BY  RT-PCR (RSV, FLU A&B, COVID)  RVPGX2  CULTURE, BLOOD (ROUTINE X 2)  CULTURE, BLOOD (ROUTINE X 2)  MRSA NEXT GEN BY PCR, NASAL  CBC WITH DIFFERENTIAL/PLATELET  URINALYSIS, ROUTINE W REFLEX MICROSCOPIC  BRAIN NATRIURETIC PEPTIDE  LACTIC ACID, PLASMA  LACTIC ACID, PLASMA  TROPONIN I (HIGH SENSITIVITY)    EKG EKG Interpretation Date/Time:  Thursday September 12 2023 04:33:30 EDT Ventricular Rate:  110 PR Interval:  102 QRS Duration:  168 QT Interval:  390 QTC Calculation: 528 R Axis:   -80  Text Interpretation: Sinus tachycardia Ventricular premature complex Probable left atrial enlargement  Nonspecific IVCD with LAD LVH with secondary repolarization abnormality AV PACED RHYTHM Confirmed by Glynn Octave [phone] on 09/12/2023 4:37:38 AM  Radiology CT Angio Chest PE W and/or Wo Contrast Result Date: 09/12/2023 CLINICAL DATA:  Recent admission for cough, fatigue, and body aches. Pulmonary embolism suspected, high probability. EXAM: CT ANGIOGRAPHY CHEST WITH CONTRAST TECHNIQUE: Multidetector CT imaging of the chest was performed using the standard protocol during bolus administration of intravenous contrast. Multiplanar CT image reconstructions and MIPs were obtained to evaluate the vascular anatomy. RADIATION DOSE REDUCTION: This exam was performed according to the departmental dose-optimization program which includes automated exposure control, adjustment of the mA and/or kV according to patient size and/or use of iterative reconstruction technique. CONTRAST:  75mL OMNIPAQUE IOHEXOL 350 MG/ML SOLN COMPARISON:  Preceding chest radiograph. FINDINGS: Cardiovascular: Satisfactory opacification of the pulmonary arteries to the segmental level. No evidence of pulmonary embolism. Enlarged heart size with biventricular pacer in stable position by report preceding radiograph. No significant pericardial effusion. There is atheromatous calcification of the aorta and left main. Enlarged main pulmonary artery  at 3.6 cm. This usually reflects pulmonary hypertension. Mediastinum/Nodes: Mild thickening of mediastinal lymph nodes which is likely reactive in this setting. Lungs/Pleura: Bilateral airspace disease affecting all lobes. There is some interlobular septal thickening and alveolar edema is considered but history and patchy distribution is more suggestive of pneumonia. Upper Abdomen: Atheromatous calcification. Musculoskeletal: Thoracic spine degeneration. Review of the MIP images confirms the above findings. IMPRESSION: 1. Multi lobar pneumonia. 2. No pulmonary embolism. 3. Chronic marked cardiomegaly. Electronically Signed   By: Tiburcio Pea M.D.   On: 09/12/2023 06:24   DG Chest 2 View Result Date: 09/12/2023 CLINICAL DATA:  Shortness of breath and cough EXAM: CHEST - 2 VIEW COMPARISON:  08/23/2023 FINDINGS: Bilateral airspace opacity similar to prior. Cardiopericardial enlargement with stable positioning of biventricular pacer from the left. No visible effusion or pneumothorax. IMPRESSION: Bilateral airspace disease similar to comparison last month, focally dense appearance favoring pneumonia over alveolar edema. Chronic cardiomegaly. Electronically Signed   By: Tiburcio Pea M.D.   On: 09/12/2023 04:49    Procedures Procedures    Medications Ordered in ED Medications - No data to display  ED Course/ Medical Decision Making/ A&P                                 Medical Decision Making Amount and/or Complexity of Data Reviewed Labs: ordered. Decision-making details documented in ED Course. Radiology: ordered and independent interpretation performed. Decision-making details documented in ED Course. ECG/medicine tests: ordered and independent interpretation performed. Decision-making details documented in ED Course.  Risk Prescription drug management.   24 hours of nausea, vomiting, fever, productive cough.  Mild tachycardia.  No increased work of breathing or hypoxia on room air.   Clear lungs to auscultation.  Will check labs, chest x-ray, EKG, viral swabs, hydrate judiciously given her low ejection fraction  Chest x-ray is concerning for multifocal pneumonia similar to her x-ray in February, perhaps slightly worse.  Results reviewed and interpreted by me.  She does have hypoxia and requiring nasal cannula oxygen.  Labs show no significant leukocytosis.  Stable anemia.  Stable creatinine.  It appears her pneumonia is no better since her hospitalization in February perhaps slightly worse.  COVID and flu swabs are negative.  Volume overload and pulmonary edema considered as well but less likely at this time.  Will antibiotics to vancomycin and cefepime.  CT scan to be obtained to evaluate for abscess, pleural effusion or other complication of pneumonia.  Admission discussed with Dr. Janalyn Shy.        Final Clinical Impression(s) / ED Diagnoses Final diagnoses:  None    Rx / DC Orders ED Discharge Orders     None         Nester Bachus, Jeannett Senior, MD 09/12/23 [phone]

## 2023-09-12 NOTE — ED Notes (Signed)
 Patients oxygen was reading low 80s. Placed on 3L [place] and is staying around 92 at this moment. Let provider know of patients status.

## 2023-09-12 NOTE — ED Notes (Signed)
 Called 4th floor to advise that pt was ready to come up

## 2023-09-12 NOTE — Consult Note (Addendum)
 Cardiology Consultation   Patient ID: Lynn Estrada MRN: [id number]; DOB: [birth year]  Admit date: 09/12/2023 Date of Consult: 09/12/2023  PCP:  Porfirio Oar, PA   [facility] Providers Cardiologist:  Dr. Gala Romney  Electrophysiologist:  Maurice Small, MD       Patient Profile:   Lynn Estrada is a 63 y.o. female with a hx of combined systolic and diastolic CHF, hypertension, GERD, nephrolithiasis, partial right nephrectomy, LBBB who is being seen 09/12/2023 for the evaluation of HF GDMT recommendations at the request of Dr. Kirby Crigler.  History of Present Illness:   Lynn Estrada presented to the [facility] ED on 3/13 with recurrent cough, hemoptysis, weakness, and acute shortness of breath. Patient was just admitted with very similar presentation in February (cough productive of blood tinged sputum, chest discomfort with coughing, fatigue, headache). Imaging at that time suspicious for right lower lobe pneumonia vs multifocal pneumonia. During that admission, patient's CorVue was low and she was hypotensive with collapsible IVC on echo. We ultimately recommended holding both Entresto and Lasix at d/c. Patient has since been seen in AHF clinic by Dr. Gala Romney where she continued to appear euvolemic. Entresto resumed at that time. Patient tells me today that while she didn't feel like she ever fully recovered following her recent admission, she didn't develop significant shortness of breath until yesterday. She denies orthopnea/LE edema and reports ongoing weight loss with poor oral intake/appetite.   Cardiac history notable for an admission in January of 2023, diagnosed with acute HFrEF. 07/13/21 Echo 20-25%, RV normal, grade II DD , and septal - lateral dyssnchrony. R/L cath w/ Minimal CAD, mildly elevated filling pressures and normal CO. cMRI with possible myocarditis. She was started on Jardiance and Spironolactone at that time, no BB or ARNI due to hypotension. Patient had  CRT-D placed 01/14/23, good results with narrowed QRS. Admitted in 11/24 with stroke like symptoms (aphasia) CTA head a showed a possible small distal ACA occlusion.  CT head was negative for stroke. 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 Echo 11/24 with LVEF 20-25%. Last seen in HF clinic a month ago, was taking Digoxin 0.125mg  daily, Spironolactone 25mg  daily, Entresto 24-26mg  BID, Jardiance 10mg . Bisoprolol 2.5mg  daily added.   Past Medical History:  Diagnosis Date   Anemia    Arthritis    CHF (congestive heart failure) (HCC) 12/06/2022   EF is 20% to 25 %   COPD (chronic obstructive pulmonary disease) (HCC)    Diabetes mellitus without complication (HCC)    Eczema    GERD (gastroesophageal reflux disease)    History of adenomatous polyp of colon    08-03-2016  tubular adenoma x2 and hyperplastic   History of chronic gastritis 08/03/2016   History of ectopic pregnancy 1988   s/p  left salpingectomy   History of endometriosis    History of kidney stones    History of partial nephrectomy    right pelvis for very large stone   History of pneumonia 07/02/2016   CAP   History of sepsis    07-01-2016  sepsis w/ pyelonephritis, CAP /   12-31-2016  urosepsis w/ kidney stone obstruction   History of uterine leiomyoma    Hx of adenomatous colonic polyps 08/03/2016   2 adenomas, 1 hpp and 1 lost polyp 2018 all < 1 cm    Hyperlipidemia    Hypertension    Left ureteral stone    Myocardial infarction (  HCC)    Nephrolithiasis    left obstructive stone and right non-obstructive stone  per CT 12-31-2016   Urgency of urination     Past Surgical History:  Procedure Laterality Date   ABDOMINAL HYSTERECTOMY  01/20/2007   w/ Lysis Adhesions/  Left salpingoophorectomy/  Right Salpingectomy   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   Procedure: BIV ICD INSERTION CRT-D;  Surgeon: Maurice Small, MD;  Location: [facility] INVASIVE CV LAB;   Service: Cardiovascular;  Laterality: N/A;   CHOLECYSTECTOMY  1994   and Right Partial Nephrectomy (pelvis for very large stone)   COLONOSCOPY WITH PROPOFOL N/A 03/26/2023   Procedure: COLONOSCOPY WITH PROPOFOL;  Surgeon: Iva Boop, MD;  Location: [facility];  Service: Gastroenterology;  Laterality: N/A;   CYSTOSCOPY W/ URETERAL STENT PLACEMENT Left 12/31/2016   Procedure: CYSTOSCOPY WITH RETROGRADE PYELOGRAM/ LEFT URETERAL STENT PLACEMENT;  Surgeon: Sebastian Ache, MD;  Location: [facility];  Service: Urology;  Laterality: Left;   CYSTOSCOPY WITH RETROGRADE PYELOGRAM, URETEROSCOPY AND STENT PLACEMENT Left 01/18/2017   Procedure: 1ST STAGE CYSTOSCOPY WITH RETROGRADE PYELOGRAM, URETEROSCOPY AND STENT REPLACEMENT;  Surgeon: Sebastian Ache, MD;  Location: [facility];  Service: Urology;  Laterality: Left;   CYSTOSCOPY WITH RETROGRADE PYELOGRAM, URETEROSCOPY AND STENT PLACEMENT Left 02/01/2017   Procedure: 2ND STAGE CYSTOSCOPY WITH RETROGRADE PYELOGRAM, URETEROSCOPY AND STENT REPLACEMENT;  Surgeon: Sebastian Ache, MD;  Location: [facility];  Service: Urology;  Laterality: Left;   ECTOPIC PREGNANCY SURGERY  1988   Left Salpingectomy   HOLMIUM LASER APPLICATION Left 01/18/2017   Procedure: HOLMIUM LASER APPLICATION;  Surgeon: Sebastian Ache, MD;  Location: [facility];  Service: Urology;  Laterality: Left;   HOLMIUM LASER APPLICATION Left 02/01/2017   Procedure: HOLMIUM LASER APPLICATION;  Surgeon: Sebastian Ache, MD;  Location: [facility];  Service: Urology;  Laterality: Left;   LUMBAR DISC SURGERY  01/1999   right L5 -- S1   POLYPECTOMY  03/26/2023   Procedure: POLYPECTOMY;  Surgeon: Iva Boop, MD;  Location: [facility];  Service: Gastroenterology;;   RE-EXPLORATION LUMBAR/  LAMINECTOMY AND MICRODISKECTOMY  10/14/2001   right L5 -- S1   RIGHT HEART CATH N/A 05/23/2022   Procedure: RIGHT HEART CATH;  Surgeon: Dolores Patty, MD;  Location: [facility] INVASIVE CV LAB;  Service: Cardiovascular;  Laterality: N/A;   RIGHT/LEFT HEART CATH AND CORONARY ANGIOGRAPHY N/A 07/18/2021   Procedure: RIGHT/LEFT HEART CATH AND CORONARY ANGIOGRAPHY;  Surgeon: Dolores Patty, MD;  Location: [facility] INVASIVE CV LAB;  Service: Cardiovascular;  Laterality: N/A;   TUBAL LIGATION Bilateral 10/17/1999   PPTL   UMBILICAL HERNIA REPAIR  age 23     Home Medications:  Prior to Admission medications   Medication Sig Start Date End Date Taking? Authorizing Provider  acetaminophen (TYLENOL) 500 MG tablet Take 500-1,000 mg by mouth every 6 (six) hours as needed (for pain.).   Yes [provider]  atorvastatin (LIPITOR) 10 MG tablet Take 10 mg by mouth in the morning. 05/15/21  Yes [provider]  bisoprolol (ZEBETA) 5 MG tablet Take 0.5 tablets (2.5 mg total) by mouth daily. 07/19/23  Yes Bensimhon, Bevelyn Buckles, MD  Calcium Carbonate Antacid (TUMS E-X PO) Take 1 tablet by mouth daily.   Yes [provider]  clopidogrel (PLAVIX) 75 MG tablet Take 1 tablet (75 mg total) by mouth daily. 06/24/23 06/18/24 Yes Butch Penny, NP  digoxin (LANOXIN) 0.125 MG tablet Take 1 tablet (125 mcg total) by  mouth daily. 09/03/23  Yes Bensimhon, Bevelyn Buckles, MD  furosemide (LASIX) 20 MG tablet Take 1 tablet (20 mg total) by mouth daily. 08/27/23 08/26/24 Yes Alwyn Ren, MD  JARDIANCE 10 MG TABS tablet Take 10 mg by mouth daily. 09/07/23  Yes [provider]  metFORMIN (GLUCOPHAGE) 500 MG tablet Take 1 tablet (500 mg total) by mouth 2 (two) times daily with a meal. 05/27/23 09/02/24 Yes Tawkaliyar, Roya, DO  Multiple Vitamin (MULTIVITAMIN WITH MINERALS) TABS tablet Take 1 tablet by mouth in the morning. One A Day for Women 50+   Yes [provider]  sacubitril-valsartan (ENTRESTO) 24-26 MG Take 1 tablet by mouth 2 (two) times daily. 09/03/23  Yes Bensimhon, Bevelyn Buckles, MD    Inpatient Medications: Scheduled Meds:  bisoprolol  2.5  mg Oral Daily   clopidogrel  75 mg Oral Daily   digoxin  125 mcg Oral Daily   enoxaparin (LOVENOX) injection  40 mg Subcutaneous Q24H   insulin aspart  0-15 Units Subcutaneous TID WC   insulin aspart  0-5 Units Subcutaneous QHS   sacubitril-valsartan  1 tablet Oral BID   spironolactone  25 mg Oral Daily   Continuous Infusions:  ceFEPime (MAXIPIME) IV     [START ON 09/13/2023] vancomycin     PRN Meds: acetaminophen **OR** acetaminophen, albuterol, ondansetron **OR** ondansetron (ZOFRAN) IV, traZODone  Allergies:    Allergies  Allergen Reactions   Latex Rash and Hives   Bactrim [Sulfamethoxazole-Trimethoprim] Rash   Carvedilol Itching   Metoprolol Itching and Rash    Social History:   Social History   Socioeconomic History   Marital status: Married    Spouse name: Richard Yeatts   Number of children: 1   Years of education: college   Highest education level: Not on file  Occupational History   Occupation: Product manager  Tobacco Use   Smoking status: Former    Current packs/day: 0.00    Average packs/day: 0.8 packs/day for 5.0 years (3.8 ttl pk-yrs)    Types: Cigarettes    Start date: 01/11/1992    Quit date: 01/10/1997    Years since quitting: 26.6   Smokeless tobacco: Never  Vaping Use   Vaping status: Never Used  Substance and Sexual Activity   Alcohol use: Not Currently    Comment: social use; once a month   Drug use: No   Sexual activity: Yes    Partners: Male    Birth control/protection: Surgical  Other Topics Concern   Not on file  Social History Narrative   Lives with her husband and their daughter and their dog.   Social Drivers of Corporate investment banker Strain: Low Risk  (08/28/2023)   Received from [facility]   Overall Financial Resource Strain (CARDIA)    Difficulty of Paying Living Expenses: Not hard at all  Food Insecurity: No Food Insecurity (08/28/2023)   Received from [facility]   Hunger Vital Sign    Worried About Running  Out of Food in the Last Year: Never true    Ran Out of Food in the Last Year: Never true  Transportation Needs: No Transportation Needs (08/28/2023)   Received from [facility] - Transportation    Lack of Transportation (Medical): No    Lack of Transportation (Non-Medical): No  Physical Activity: Insufficiently Active (08/20/2022)   [facility] from  [facility]   Exercise Vital Sign    Days of Exercise per Week: 3 days  Minutes of Exercise per Session: 30 min  Stress: No Stress Concern Present (08/20/2022)   Received from [facility], [facility] - Occupational Stress Questionnaire    Feeling of Stress : Only a [place]  Social Connections: Socially Integrated (08/20/2022)   Received from [facility], [facility]   Social Network    How would you rate your social network (family, work, friends)?: Good participation with social networks  Intimate Partner Violence: Not At Risk (08/24/2023)   Humiliation, Afraid, Rape, and Kick questionnaire    Fear of Current or Ex-Partner: No    Emotionally Abused: No    Physically Abused: No    Sexually Abused: No    Family History:    Family History  Problem Relation Age of Onset   Sarcoidosis Mother    Prostate cancer Father    Hypertension Sister    Eczema Sister    Lupus Sister    Breast cancer Maternal Aunt    Gout Maternal Grandmother    Diabetes Maternal Grandmother        leg amputations   Stomach cancer Neg Hx    Colon cancer Neg Hx    Esophageal cancer Neg Hx    Rectal cancer Neg Hx      ROS:  Please see the history of present illness.   All other ROS reviewed and negative.     Physical Exam/Data:   Vitals:   09/12/23 0715 09/12/23 0743 09/12/23 0840 09/12/23 1000  BP: 136/84  96/64 102/65  Pulse: (!) 110  (!) 105 100  Resp: (!) 26  (!) 30 (!) 24  Temp:  98.9 F (37.2 C)    TempSrc:  Oral    SpO2: 92%  97% 96%  Weight:       No intake or  output data in the 24 hours ending 09/12/23 1047    09/12/2023    3:59 AM 09/03/2023    3:45 PM 08/27/2023    5:00 AM  Last 3 Weights  Weight (lbs) 202 lb 13.2 oz 201 lb 207 lb 0.2 oz  Weight (kg) 92 kg 91.173 kg 93.9 kg     Body mass index is 26.76 kg/m.  General:  Ill and tired appearing. Frequent cough. HEENT: normal Neck: no JVD Vascular: No carotid bruits; Distal pulses 2+ bilaterally Cardiac:  normal S1, S2; RRR; no murmur  Lungs:  bilateral rhonci, right>left  Abd: soft, nontender, no hepatomegaly  Ext: no edema Musculoskeletal:  No deformities, BUE and BLE strength normal and equal Skin: warm and dry  Neuro:  CNs 2-12 intact, no focal abnormalities noted Psych:  Normal affect   EKG:  The EKG was personally reviewed and demonstrates:  A sensed, V-paced rhythm, slightly tachycardic  Relevant CV Studies: 08/25/23 TTE  IMPRESSIONS     1. Left ventricular ejection fraction, by estimation, is 20 to 25%. The  left ventricle has severely decreased function. The left ventricle  demonstrates global hypokinesis. The left ventricular internal cavity size  was severely dilated. Left ventricular  diastolic parameters are consistent with Grade II diastolic dysfunction  (pseudonormalization). Elevated left atrial pressure.   2. Right ventricular systolic function is normal. The right ventricular  size is normal. Tricuspid regurgitation signal is inadequate for assessing  PA pressure.   3. Left atrial size was moderately dilated.   4. The mitral valve is normal in structure. Trivial mitral valve  regurgitation. No evidence of mitral stenosis.   5. The aortic  valve is tricuspid. Aortic valve regurgitation is not  visualized. No aortic stenosis is present.   6. The inferior vena cava is normal in size with greater than 50%  respiratory variability, suggesting right atrial pressure of 3 mmHg.   Comparison(s): Prior images reviewed side by side. The left ventricular  function is  unchanged. The left ventricular diastolic function is  significantly worse.   FINDINGS   Left Ventricle: Left ventricular ejection fraction, by estimation, is 20  to 25%. The left ventricle has severely decreased function. The left  ventricle demonstrates global hypokinesis. Strain imaging was not  performed. The left ventricular internal  cavity size was severely dilated. There is no left ventricular  hypertrophy. Abnormal (paradoxical) septal motion, consistent with left  bundle branch block. Left ventricular diastolic parameters are consistent  with Grade II diastolic dysfunction  (pseudonormalization). Elevated left atrial pressure.   Right Ventricle: The right ventricular size is normal. No increase in  right ventricular wall thickness. Right ventricular systolic function is  normal. Tricuspid regurgitation signal is inadequate for assessing PA  pressure.   Left Atrium: Left atrial size was moderately dilated.   Right Atrium: Right atrial size was normal in size.   Pericardium: Trivial pericardial effusion is present.   Mitral Valve: The mitral valve is normal in structure. Trivial mitral  valve regurgitation. No evidence of mitral valve stenosis.   Tricuspid Valve: The tricuspid valve is normal in structure. Tricuspid  valve regurgitation is not demonstrated.   Aortic Valve: The aortic valve is tricuspid. Aortic valve regurgitation is  not visualized. No aortic stenosis is present.   Pulmonic Valve: The pulmonic valve was grossly normal. Pulmonic valve  regurgitation is not visualized. No evidence of pulmonic stenosis.   Aorta: The aortic root and ascending aorta are structurally normal, with  no evidence of dilitation.   Venous: The inferior vena cava is normal in size with greater than 50%  respiratory variability, suggesting right atrial pressure of 3 mmHg.   IAS/Shunts: No atrial level shunt detected by color flow Doppler.   Additional Comments: 3D imaging was  not performed.   Laboratory Data:  High Sensitivity Troponin:   Recent Labs  Lab 09/12/23 0454 09/12/23 0607  TROPONINIHS 35* 39*     Chemistry Recent Labs  Lab 09/12/23 0454  NA 139  K 4.0  CL 103  CO2 23  GLUCOSE 138*  BUN 14  CREATININE 0.95  CALCIUM 9.2  GFRNONAA >60  ANIONGAP 13    Recent Labs  Lab 09/12/23 0454  PROT 7.4  ALBUMIN 3.9  AST 19  ALT 24  ALKPHOS 51  BILITOT 1.2   Lipids No results for input(s): "CHOL", "TRIG", "HDL", "LABVLDL", "LDLCALC", "CHOLHDL" in the last 168 hours.  Hematology Recent Labs  Lab 09/12/23 0550  WBC 10.1  RBC 3.20*  HGB 9.0*  HCT 29.0*  MCV 90.6  MCH 28.1  MCHC 31.0  RDW 14.4  PLT 228   Thyroid No results for input(s): "TSH", "FREET4" in the last 168 hours.  BNP Recent Labs  Lab 09/12/23 0550  BNP 1,151.1*    DDimer No results for input(s): "DDIMER" in the last 168 hours.   Radiology/Studies:  CT Angio Chest PE W and/or Wo Contrast Result Date: 09/12/2023 CLINICAL DATA:  Recent admission for cough, fatigue, and body aches. Pulmonary embolism suspected, high probability. EXAM: CT ANGIOGRAPHY CHEST WITH CONTRAST TECHNIQUE: Multidetector CT imaging of the chest was performed using the standard protocol during bolus administration of intravenous contrast.  Multiplanar CT image reconstructions and MIPs were obtained to evaluate the vascular anatomy. RADIATION DOSE REDUCTION: This exam was performed according to the departmental dose-optimization program which includes automated exposure control, adjustment of the mA and/or kV according to patient size and/or use of iterative reconstruction technique. CONTRAST:  75mL OMNIPAQUE IOHEXOL 350 MG/ML SOLN COMPARISON:  Preceding chest radiograph. FINDINGS: Cardiovascular: Satisfactory opacification of the pulmonary arteries to the segmental level. No evidence of pulmonary embolism. Enlarged heart size with biventricular pacer in stable position by report preceding radiograph. No  significant pericardial effusion. There is atheromatous calcification of the aorta and left main. Enlarged main pulmonary artery at 3.6 cm. This usually reflects pulmonary hypertension. Mediastinum/Nodes: Mild thickening of mediastinal lymph nodes which is likely reactive in this setting. Lungs/Pleura: Bilateral airspace disease affecting all lobes. There is some interlobular septal thickening and alveolar edema is considered but history and patchy distribution is more suggestive of pneumonia. Upper Abdomen: Atheromatous calcification. Musculoskeletal: Thoracic spine degeneration. Review of the MIP images confirms the above findings. IMPRESSION: 1. Multi lobar pneumonia. 2. No pulmonary embolism. 3. Chronic marked cardiomegaly. Electronically Signed   By: Tiburcio Pea M.D.   On: 09/12/2023 06:24   DG Chest 2 View Result Date: 09/12/2023 CLINICAL DATA:  Shortness of breath and cough EXAM: CHEST - 2 VIEW COMPARISON:  08/23/2023 FINDINGS: Bilateral airspace opacity similar to prior. Cardiopericardial enlargement with stable positioning of biventricular pacer from the left. No visible effusion or pneumothorax. IMPRESSION: Bilateral airspace disease similar to comparison last month, focally dense appearance favoring pneumonia over alveolar edema. Chronic cardiomegaly. Electronically Signed   By: Tiburcio Pea M.D.   On: 09/12/2023 04:49     Assessment and Plan:   Chronic combined systolic and diastolic CHF NICM Patient followed by AHF clinic. Last seen on 09/03/23 following an admission. At that time was noted to be euvolemic. Currently admitted with suspected hospital acquired pneumonia (was recently admitted with nearly identical symptoms). BNP this admission is 1151. Troponin 35->39. Patient appears euvolemic on exam. Bilateral rhonchi on physical exam.  Clinical suspicion for acute CHF is low. Patient continues to lose weight and is not reporting LE edema or orthopnea. Will send device transmission to  assess CorVue. Lasix has not been needed following recent discharge/patient still appears euvolemic and does not appaer to need a chronic loop diuretic at this time. Continue Digoxin 0.125mg  daily Continue Spironolactone 25mg  daily Continue Bisoprolol 2.5mg  daily Continue Entresto 24-26mg  BID. Would recommend holding for systolic BP less than .   Addendum: CorVue showing continuous signs of fluid retention but does not appear to be acutely worse than prior interrogation. Not clear that her BP would tolerate much diuresis at this point.   Hypertension BP normal to low normal this admission. HF GDMT as above.  LBBB S/P St Jude Abbott CRT-D. QRS stable this admission.  CVA Hyperlipidemia Patient with CVA in November 2024. Continue Plavix and statin.    Pneumonia Per primary team.   Risk Assessment/Risk Scores:        New York Heart Association (NYHA) Functional Class NYHA Class II-III        For questions or updates, please contact [facility] Please consult [email or web address] for contact info under    Signed, Perlie Gold, PA-C  09/12/2023 10:47 AM  Patient seen and examined with Perlie Gold, PA-C.  Agree as above, with the following exceptions and changes as noted below.  Patient is a 63 year old female with a history of systolic heart failure hypertension, left  bundle branch block followed by our advanced heart failure clinic who presents recurrently with what appears to be a pneumonia.  Patient feels febrile, with chills, productive blood-tinged sputum and appears somewhat toxic.  No definite signs of volume overload by exam, her device records impedance and there was evidence of unchanged but elevated pressure.  Interestingly her IVC was collapsible on her last echocardiogram but her E over E prime was 23 suggesting elevated LVEDP.  She is hypotensive currently and concerns of infection.. Gen: Mildly distressed, CV: RRR, no murmurs, Lungs: Coarse breath sounds,  Abd: soft, Extrem: Warm, trace edema edema, Neuro/Psych: alert and oriented x 3, normal mood and affect. All available labs, radiology testing, previous records reviewed.  Patient appears to have pneumonia and is currently being treated with IV antibiotics.  If blood pressure appears better tomorrow after continued treatment of pneumonia, can consider one-time dose of IV Lasix, patient does not routinely take diuretic at home despite this core view reading of chronically elevated filling pressures.  With hypotension, and no signs of decompensated heart failure on exam, will await improvement clinically.  Parke Poisson, MD 09/12/23 5:43 PM

## 2023-09-12 NOTE — ED Triage Notes (Signed)
 Patient recently admitted for cough, fatigue and body aches.  Patient also endorses n/v/d.  Patient states this feels like she has pna again.

## 2023-09-12 NOTE — Hospital Course (Signed)
 63 year old female history of combined systolic and diastolic heart failure reduced EF 20 to 25%, normocytic anemia, eczema, GERD, non-insulin-dependent DM type II, and chronic gastritis presented to emergency department with complaining of cough, fatigue and body ache.    Hemodynamically stable. CBC showing pending WBC count, stable H&H and normal platelet count. Pending BNP level. Normal respiratory panel. CMP unremarkable. Chest x-ray bilateral airspace disease similar to comparison to last month, focally dense appearance favoring pneumonia over alveolar edema.  Chronic cardiomegaly. Pending CTA chest PE resulted.  With concern for pneumonia in the ED patient has been treated with IV vancomycin and cefepime.  Blood cultures has been obtained.  Per chart review patient was admitted in the hospital in February 2025 for community-acquired pneumonia while she has been treated with Rocephin and azithromycin and discharged with course of Augmentin.  Hospitalist has been consulted for further evaluation management of community-acquired pneumonia.

## 2023-09-12 NOTE — Progress Notes (Signed)
 Pharmacy Antibiotic Note  Lynn Estrada is a 63 y.o. female admitted on 09/12/2023 with pneumonia.  Pharmacy has been consulted for Vanco, Cefepime dosing.  Active Problem(s): Cough, body aches, shortness of breath, N/V - acute hypoxic respiratory failure and healthcare acquired multifocal pneumonia.   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 Goal AUC 400-550. Expected AUC: 458 SCr used: 0.95    Weight: 92 kg (202 lb 13.2 oz)  Temp (24hrs), Avg:98.9 F (37.2 C), Min:98.9 F (37.2 C), Max:98.9 F (37.2 C)  Recent Labs  Lab 09/12/23 0454 09/12/23 0550 09/12/23 0553  WBC  --  10.1  --   CREATININE 0.95  --   --   LATICACIDVEN  --   --  1.0    Estimated Creatinine Clearance: 80.5 mL/min (by C-G formula based on SCr of 0.95 mg/dL).    Allergies  Allergen Reactions   Latex Rash and Hives   Bactrim [Sulfamethoxazole-Trimethoprim] Rash   Carvedilol Itching   Metoprolol Itching and Rash    Lynn Tapp S. Merilynn Finland, PharmD, BCPS Clinical Staff Pharmacist  [facility] 09/12/2023 9:46 AM

## 2023-09-12 NOTE — H&P (Signed)
 History and Physical  Lynn Estrada WUJ:[id number] DOB: 1960-09-04 DOA: 09/12/2023  PCP: Porfirio Oar, PA   Chief Complaint: Cough, body aches, shortness of breath  HPI: Lynn Estrada is a 63 y.o. 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  She was hospitalized in November 2024 with acute CVA at [facility].  She was then hospitalized at [facility] 2/21 to 08/27/2023 treated for UTI and community-acquired pneumonia.  During that hospital stay, she was also felt to be in acute combined systolic and diastolic heart failure, she was given Lasix, her [name] was held due to hypotension.  She followed up with heart failure team on 3//25, her [name] was resumed.  She has not been on Lasix since her hospitalization, unclear why this is.  In any case, she continued to have weakness, productive cough, and sensation of dyspnea since her hospital discharge.  Over the last couple of days, she developed worsening cough, dyspnea, was coughing up discolored sputum that looked like it might be tinged with a Natzke bit of blood.  She also had some nausea and vomiting.  She denies any orthopnea, or lower extremity edema.  No fevers or chills at home.  On workup in the emergency department, she was saturating 83% on room air, was placed on 3 L nasal cannula oxygen.  She has been afebrile.  Workup as detailed below shows evidence of multifocal pneumonia seen on CT scan.  She was given broad-spectrum antibiotics, and admitted to the hospitalist service.  Review of Systems: Please see HPI for pertinent positives and negatives. A complete 10 system review of systems are otherwise  negative.  Past Medical History:  Diagnosis Date   Anemia    Arthritis    CHF (congestive heart failure) (HCC) 12/06/2022   EF is 20% to 25 %   COPD (chronic obstructive pulmonary disease) (HCC)    Diabetes mellitus without complication (HCC)    Eczema    GERD (gastroesophageal reflux disease)    History of adenomatous polyp of colon    08-03-2016  tubular adenoma x2 and hyperplastic   History of chronic gastritis 08/03/2016   History of ectopic pregnancy 1988   s/p  left salpingectomy   History of endometriosis    History of kidney stones    History of partial nephrectomy    right pelvis for very large stone   History of pneumonia 07/02/2016   CAP   History of sepsis    07-01-2016  sepsis w/ pyelonephritis, CAP /   12-31-2016  urosepsis w/ kidney stone obstruction   History of uterine leiomyoma    Hx of adenomatous colonic polyps 08/03/2016   2 adenomas, 1 hpp and 1 lost polyp 2018 all < 1 cm    Hyperlipidemia    Hypertension    Left ureteral stone    Myocardial infarction [facility])    Nephrolithiasis    left obstructive stone and right non-obstructive stone  per CT 12-31-2016   Urgency of urination    Past Surgical History:  Procedure Laterality Date   ABDOMINAL HYSTERECTOMY  01/20/2007   w/ Lysis Adhesions/  Left salpingoophorectomy/  Right Salpingectomy   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   Procedure: BIV ICD INSERTION CRT-D;  Surgeon: Nelly Laurence,  Roberts Gaudy, MD;  Location: [facility];  Service: Cardiovascular;  Laterality: N/A;   CHOLECYSTECTOMY  1994   and Right Partial Nephrectomy (pelvis for very large stone)   COLONOSCOPY WITH PROPOFOL N/A 03/26/2023   Procedure: COLONOSCOPY WITH PROPOFOL;  Surgeon: Iva Boop, MD;  Location: [facility];  Service: Gastroenterology;  Laterality: N/A;   CYSTOSCOPY W/ URETERAL STENT PLACEMENT Left 12/31/2016   Procedure: CYSTOSCOPY WITH RETROGRADE PYELOGRAM/ LEFT URETERAL STENT PLACEMENT;  Surgeon: Sebastian Ache, MD;   Location: [facility];  Service: Urology;  Laterality: Left;   CYSTOSCOPY WITH RETROGRADE PYELOGRAM, URETEROSCOPY AND STENT PLACEMENT Left 01/18/2017   Procedure: 1ST STAGE CYSTOSCOPY WITH RETROGRADE PYELOGRAM, URETEROSCOPY AND STENT REPLACEMENT;  Surgeon: Sebastian Ache, MD;  Location: [facility];  Service: Urology;  Laterality: Left;   CYSTOSCOPY WITH RETROGRADE PYELOGRAM, URETEROSCOPY AND STENT PLACEMENT Left 02/01/2017   Procedure: 2ND STAGE CYSTOSCOPY WITH RETROGRADE PYELOGRAM, URETEROSCOPY AND STENT REPLACEMENT;  Surgeon: Sebastian Ache, MD;  Location: [facility];  Service: Urology;  Laterality: Left;   ECTOPIC PREGNANCY SURGERY  1988   Left Salpingectomy   HOLMIUM LASER APPLICATION Left 01/18/2017   Procedure: HOLMIUM LASER APPLICATION;  Surgeon: Sebastian Ache, MD;  Location: [facility];  Service: Urology;  Laterality: Left;   HOLMIUM LASER APPLICATION Left 02/01/2017   Procedure: HOLMIUM LASER APPLICATION;  Surgeon: Sebastian Ache, MD;  Location: [facility];  Service: Urology;  Laterality: Left;   LUMBAR DISC SURGERY  01/1999   right L5 -- S1   POLYPECTOMY  03/26/2023   Procedure: POLYPECTOMY;  Surgeon: Iva Boop, MD;  Location: [facility];  Service: Gastroenterology;;   RE-EXPLORATION LUMBAR/  LAMINECTOMY AND MICRODISKECTOMY  10/14/2001   right L5 -- S1   RIGHT HEART CATH N/A 05/23/2022   Procedure: RIGHT HEART CATH;  Surgeon: Dolores Patty, MD;  [facility]: [facility];  Service: Cardiovascular;  Laterality: N/A;   RIGHT/LEFT HEART CATH AND CORONARY ANGIOGRAPHY N/A 07/18/2021   Procedure: RIGHT/LEFT HEART CATH AND CORONARY ANGIOGRAPHY;  Surgeon: Dolores Patty, MD;  Location: [facility];  Service: Cardiovascular;  Laterality: N/A;   TUBAL LIGATION Bilateral 10/17/1999   PPTL   UMBILICAL HERNIA REPAIR  age 85   Social History:  reports that she quit smoking about 26 years ago. Her smoking use  included cigarettes. She started smoking about 31 years ago. She has a 3.8 pack-year smoking history. She has never used smokeless tobacco. She reports that she does not currently use alcohol. She reports that she does not use drugs.  Allergies  Allergen Reactions   Latex Rash and Hives   Bactrim [Sulfamethoxazole-Trimethoprim] Rash   Carvedilol Itching   Metoprolol Itching and Rash    Family History  Problem Relation Age of Onset   Sarcoidosis Mother    Prostate cancer Father    Hypertension Sister    Eczema Sister    Lupus Sister    Breast cancer Maternal Aunt    Gout Maternal Grandmother    Diabetes Maternal Grandmother        leg amputations   Stomach cancer Neg Hx    Colon cancer Neg Hx    Esophageal cancer Neg Hx    Rectal cancer Neg Hx      Prior to Admission medications   Medication Sig Start Date End Date Taking? Authorizing Provider  acetaminophen (TYLENOL) 500 MG tablet Take 500-1,000 mg by mouth every 6 (six) hours as needed (for pain.).  [provider]  aspirin EC 81 MG tablet Take 81 mg by mouth daily. Swallow whole. Patient not taking: Reported on 09/03/2023    [provider]  atorvastatin (LIPITOR) 10 MG tablet Take 10 mg by mouth in the morning. 05/15/21   [provider]  bisoprolol (ZEBETA) 5 MG tablet Take 0.5 tablets (2.5 mg total) by mouth daily. 07/19/23   Bensimhon, Bevelyn Buckles, MD  Calcium Carbonate Antacid (TUMS E-X PO) Take 1 tablet by mouth daily.    [provider]  clopidogrel (PLAVIX) 75 MG tablet Take 1 tablet (75 mg total) by mouth daily. 06/24/23 06/18/24  Butch Penny, NP  digoxin (LANOXIN) 0.125 MG tablet Take 1 tablet (125 mcg total) by mouth daily. 09/03/23   Bensimhon, Bevelyn Buckles, MD  furosemide (LASIX) 20 MG tablet Take 1 tablet (20 mg total) by mouth daily. 08/27/23 08/26/24  Alwyn Ren, MD  metFORMIN (GLUCOPHAGE) 500 MG tablet Take 1 tablet (500 mg total) by mouth 2 (two) times daily with a meal.  05/27/23 09/02/24  Tawkaliyar, Roya, DO  Multiple Vitamin (MULTIVITAMIN WITH MINERALS) TABS tablet Take 1 tablet by mouth in the morning. One A Day for Women 50+    [provider]  sacubitril-valsartan (ENTRESTO) 24-26 MG Take 1 tablet by mouth 2 (two) times daily. 09/03/23   Bensimhon, Bevelyn Buckles, MD    Physical Exam: BP 136/84   Pulse (!) 110   Temp 98.9 F (37.2 C) (Oral)   Resp (!) 26   Wt 92 kg   SpO2 92%   BMI 26.76 kg/m  General:  Alert, oriented, calm, in no acute distress, resting on 3 L nasal cannula oxygen.  No family at the bedside.  She looks tired, but comfortable. Cardiovascular: RRR, no murmurs or rubs, no peripheral edema  Respiratory: clear to auscultation bilaterally, although breath sounds are diminished especially at the left base, no wheezes, no crackles.  No tachypnea, retractions, stridor or other evidence of respiratory distress. Abdomen: soft, nontender, nondistended, normal bowel tones heard  Skin: dry, no rashes  Musculoskeletal: no joint effusions, normal range of motion  Psychiatric: appropriate affect, normal speech  Neurologic: extraocular muscles intact, clear speech, moving all extremities with intact sensorium         Labs on Admission:  Basic Metabolic Panel: Recent Labs  Lab 09/12/23 0454  NA 139  K 4.0  CL 103  CO2 23  GLUCOSE 138*  BUN 14  CREATININE 0.95  CALCIUM 9.2   Liver Function Tests: Recent Labs  Lab 09/12/23 0454  AST 19  ALT 24  ALKPHOS 51  BILITOT 1.2  PROT 7.4  ALBUMIN 3.9   No results for input(s): "LIPASE", "AMYLASE" in the last 168 hours. No results for input(s): "AMMONIA" in the last 168 hours. CBC: Recent Labs  Lab 09/12/23 0550  WBC 10.1  NEUTROABS 8.8*  HGB 9.0*  HCT 29.0*  MCV 90.6  PLT 228   Cardiac Enzymes: No results for input(s): "CKTOTAL", "CKMB", "CKMBINDEX", "TROPONINI" in the last 168 hours. BNP (last 3 results) Recent Labs    08/23/23 1312 09/12/23 0550  BNP 1,039.6* 1,151.1*     ProBNP (last 3 results) No results for input(s): "PROBNP" in the last 8760 hours.  CBG: No results for input(s): "GLUCAP" in the last 168 hours.  Radiological Exams on Admission: CT Angio Chest PE W and/or Wo Contrast Result Date: 09/12/2023 CLINICAL DATA:  Recent admission for cough, fatigue, and body aches. Pulmonary embolism suspected, high probability. EXAM:  CT ANGIOGRAPHY CHEST WITH CONTRAST TECHNIQUE: Multidetector CT imaging of the chest was performed using the standard protocol during bolus administration of intravenous contrast. Multiplanar CT image reconstructions and MIPs were obtained to evaluate the vascular anatomy. RADIATION DOSE REDUCTION: This exam was performed according to the departmental dose-optimization program which includes automated exposure control, adjustment of the mA and/or kV according to patient size and/or use of iterative reconstruction technique. CONTRAST:  75mL OMNIPAQUE IOHEXOL 350 MG/ML SOLN COMPARISON:  Preceding chest radiograph. FINDINGS: Cardiovascular: Satisfactory opacification of the pulmonary arteries to the segmental level. No evidence of pulmonary embolism. Enlarged heart size with biventricular pacer in stable position by report preceding radiograph. No significant pericardial effusion. There is atheromatous calcification of the aorta and left main. Enlarged main pulmonary artery at 3.6 cm. This usually reflects pulmonary hypertension. Mediastinum/Nodes: Mild thickening of mediastinal lymph nodes which is likely reactive in this setting. Lungs/Pleura: Bilateral airspace disease affecting all lobes. There is some interlobular septal thickening and alveolar edema is considered but history and patchy distribution is more suggestive of pneumonia. Upper Abdomen: Atheromatous calcification. Musculoskeletal: Thoracic spine degeneration. Review of the MIP images confirms the above findings. IMPRESSION: 1. Multi lobar pneumonia. 2. No pulmonary embolism. 3.  Chronic marked cardiomegaly. Electronically Signed   By: Tiburcio Pea M.D.   On: 09/12/2023 06:24   DG Chest 2 View Result Date: 09/12/2023 CLINICAL DATA:  Shortness of breath and cough EXAM: CHEST - 2 VIEW COMPARISON:  08/23/2023 FINDINGS: Bilateral airspace opacity similar to prior. Cardiopericardial enlargement with stable positioning of biventricular pacer from the left. No visible effusion or pneumothorax. IMPRESSION: Bilateral airspace disease similar to comparison last month, focally dense appearance favoring pneumonia over alveolar edema. Chronic cardiomegaly. Electronically Signed   By: Tiburcio Pea M.D.   On: 09/12/2023 04:49   Assessment/Plan Lynn Estrada is a 63 y.o. 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  Healthcare acquired multifocal pneumonia-with increased cough, sputum production, hypoxia.  No fever or leukocytosis, not meeting sepsis criteria.  This is in the setting of recent hospitalization less than a month ago. -Inpatient admission -Empiric IV cefepime and IV vancomycin  Acute hypoxic respiratory failure-due to multifocal pneumonia, no wheezing or other evidence of acute COPD exacerbation.  No PE seen on CT scan today. -Continue supplemental oxygen, with goal O2 saturation greater than 89% -Albuterol as needed for wheezing or shortness of breath  Hemoptysis-likely due to significant coughing in the setting of Plavix, hemodynamically stable, hemoglobin is relatively stable.  No further hemoptysis since admission.  Will monitor.  Type 2 diabetes-non-insulin-dependent; hold home metformin, carb modified heart healthy diet, with moderate  dose sliding scale  Combined systolic and diastolic heart failure-EF 20 to 25%.  She appears euvolemic currently.  It seems that she was discharged from the hospital on Lasix, but this has not been continued.  No mention of Lasix in most recent cardiology notes. -Bisoprolol -Aldactone -Entresto (hold for systolic blood pressure less than 100) -Digoxin -Discussed with cardiology and requested nonurgent consultation for diuretic mangement recommendations  History of CVA-continue Plavix  Elevated troponin-without chest pain or acute EKG changes, this seems to be a chronic elevation and is flat.  No further workup indicated at this time.  Chronic anemia-appears to  be at baseline  DVT prophylaxis: Lovenox     Code Status: Full Code  Consults called: Cardiology  Admission status: The appropriate patient status for this patient is INPATIENT. Inpatient status is judged to be reasonable and necessary in order to provide the required intensity of service to ensure the patient's safety. The patient's presenting symptoms, physical exam findings, and initial radiographic and laboratory data in the context of their chronic comorbidities is felt to place them at high risk for further clinical deterioration. Furthermore, it is not anticipated that the patient will be medically stable for discharge from the hospital within 2 midnights of admission.    I certify that at the point of admission it is my clinical judgment that the patient will require inpatient hospital care spanning beyond 2 midnights from the point of admission due to high intensity of service, high risk for further deterioration and high frequency of surveillance required  Time spent: 59 minutes  Kyrin Garn Sharlette Dense MD [facility] Pager [phone]  If 7PM-7AM, please contact night-coverage [email or web address] Password [facility]  09/12/2023, 7:57 AM

## 2023-09-13 DIAGNOSIS — J189 Pneumonia, unspecified organism: Secondary | ICD-10-CM | POA: Diagnosis not present

## 2023-09-13 LAB — CBC
HCT: 31.3 % — ABNORMAL LOW (ref 36.0–46.0)
Hemoglobin: 9.5 g/dL — ABNORMAL LOW (ref 12.0–15.0)
MCH: 27.9 pg (ref 26.0–34.0)
MCHC: 30.4 g/dL (ref 30.0–36.0)
MCV: 92.1 fL (ref 80.0–100.0)
Platelets: 220 10*3/uL (ref 150–400)
RBC: 3.4 MIL/uL — ABNORMAL LOW (ref 3.87–5.11)
RDW: 14.5 % (ref 11.5–15.5)
WBC: 7.5 10*3/uL (ref 4.0–10.5)
nRBC: 0 % (ref 0.0–0.2)

## 2023-09-13 LAB — BASIC METABOLIC PANEL
Anion gap: 9 (ref 5–15)
BUN: 16 mg/dL (ref 8–23)
CO2: 24 mmol/L (ref 22–32)
Calcium: 8.5 mg/dL — ABNORMAL LOW (ref 8.9–10.3)
Chloride: 103 mmol/L (ref 98–111)
Creatinine, Ser: 1.05 mg/dL — ABNORMAL HIGH (ref 0.44–1.00)
GFR, Estimated: >60 mL/min (ref >60)
Glucose, Bld: 114 mg/dL — ABNORMAL HIGH (ref 70–99)
Potassium: 3.7 mmol/L (ref 3.5–5.1)
Sodium: 136 mmol/L (ref 135–145)

## 2023-09-13 LAB — BLOOD CULTURE ID PANEL (REFLEXED) - BCID2

## 2023-09-13 LAB — GLUCOSE, CAPILLARY
Glucose-Capillary: 97 mg/dL (ref 70–99)
Glucose-Capillary: 141 mg/dL — ABNORMAL HIGH (ref 70–99)
Glucose-Capillary: 98 mg/dL (ref 70–99)
Glucose-Capillary: 135 mg/dL — ABNORMAL HIGH (ref 70–99)

## 2023-09-13 LAB — BRAIN NATRIURETIC PEPTIDE: B Natriuretic Peptide: 538.5 pg/mL — ABNORMAL HIGH (ref 0.0–100.0)

## 2023-09-13 MED ORDER — GUAIFENESIN-DM 100-10 MG/5ML PO SYRP
5.0000 mL | ORAL_SOLUTION | ORAL | Status: DC | PRN
  Administered 2023-09-13 – 2023-09-14 (×3): 5 mL via ORAL
  Filled 2023-09-13 (×3): qty 10

## 2023-09-13 MED ORDER — GUAIFENESIN ER 600 MG PO TB12
600.0000 mg | ORAL_TABLET | Freq: Two times a day (BID) | ORAL | Status: DC
Start: 2023-09-13 — End: 2023-09-17
  Administered 2023-09-13 – 2023-09-17 (×9): 600 mg via ORAL
  Filled 2023-09-13 (×9): qty 1

## 2023-09-13 MED ORDER — ATORVASTATIN CALCIUM 10 MG PO TABS
10.0000 mg | ORAL_TABLET | Freq: Every morning | ORAL | Status: DC
  Administered 2023-09-14 – 2023-09-17 (×4): 10 mg via ORAL
  Filled 2023-09-13 (×4): qty 1

## 2023-09-13 NOTE — Progress Notes (Signed)
 OT Cancellation Note  Patient Details Name: Lynn Estrada MRN: [id number] DOB: 10-Nov-1960   Cancelled Treatment:    Reason Eval/Treat Not Completed: Medical issues which prohibited therapy OT attempted evaluation. Patient sleep in bed and reports having some nausea and fever (101.5). Patient politely requested OT evaluation tomorrow despite encouragement for light mobility to bathroom. Will re attempt visit as schedule allows and pt is able.   Vicenta Dunning 09/13/2023, 3:09 PM

## 2023-09-13 NOTE — Progress Notes (Signed)
 Remains hypotensive and recovering from pneumonia which seems to be the primary driver of her symptoms this admission. CorVue stable chronically elevated on device interrogation, however patient not routinely diuresed as outpatient. Her main concerns were productive cough and fever. Monitor closely, continue CV medical therapy currently ordered, and cardiology will follow peripherally as she recovers.   Parke Poisson, MD

## 2023-09-13 NOTE — Plan of Care (Signed)

## 2023-09-13 NOTE — Evaluation (Signed)
 Physical Therapy One Time Evaluation Patient Details Name: Lynn Estrada MRN: [id number] DOB: 05-Oct-1960 Today's Date: 09/13/2023  History of Present Illness  63 y.o. female admitted to the hospital on 09/12/23 with acute hypoxic respiratory failure and healthcare acquired multifocal pneumonia.  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 All education has been completed and the patient has no further questions.  Pt ambulated in hallway and maintained 4L O2 [place] (pt hesitant to wean at this time despite good SPO2).  Outpatient Physical Therapy recommended on recent admission and remains appropriate however no acute PT needs at time.  Pt encouraged to slowly increase activity as tolerated initially with staff (due to new oxygen requirements upon admission). PT is signing off. Thank you for this referral.         If plan is discharge home, recommend the following: Assistance with cooking/housework   Can travel by private vehicle        Equipment Recommendations None recommended by PT  Recommendations for Other Services       Functional Status Assessment Patient has had a recent decline in their functional status and demonstrates the ability to make significant improvements in function in a reasonable and predictable amount of time.     Precautions / Restrictions Precautions Precautions: Fall Precaution/Restrictions Comments: monitor sats      Mobility  Bed Mobility Overal bed mobility: Modified Independent                  Transfers Overall transfer level: Modified independent                      Ambulation/Gait Ambulation/Gait assistance:  Supervision Gait Distance (Feet): 160 Feet Assistive device: None Gait Pattern/deviations: Step-through pattern, Decreased stride length       General Gait Details: steady, denies falls, mildly dizzy but felt able to continue back to room, SpO2 96% on 4L O2 [place] (pt declined decreasing to 3L)  Stairs            Wheelchair Mobility     Tilt Bed    Modified Rankin (Stroke Patients Only)       Balance Overall balance assessment: No apparent balance deficits (not formally assessed)                                           Pertinent Vitals/Pain Pain Assessment Pain Assessment: No/denies pain    Home Living Family/patient expects to be discharged to:: Private residence Living Arrangements: Spouse/significant other;Children Available Help at Discharge: Family;Available 24 hours/day Type of Home: House Home Access: Stairs to enter Entrance Stairs-Rails: None Entrance Stairs-Number of Steps: 1 small   Home Layout: One level Home Equipment: Agricultural consultant (2 wheels);Cane - single point;Crutches      Prior Function Prior Level of Function : Independent/Modified Independent;Working/employed (has been working at [employer])             Mobility Comments: no DME ADLs Comments: No assist at baseline for ADL or IADL     Extremity/Trunk Assessment        Lower Extremity Assessment Lower Extremity Assessment: Overall WFL for  tasks assessed    Cervical / Trunk Assessment Cervical / Trunk Assessment: Normal  Communication   Communication Communication: No apparent difficulties    Cognition   Behavior During Therapy: WFL for tasks assessed/performed, Flat affect   PT - Cognitive impairments: No apparent impairments                         Following commands: Intact       Cueing       General Comments      Exercises     Assessment/Plan    PT Assessment Patient does not need any further PT services  PT Problem List          PT Treatment Interventions      PT Goals (Current goals can be found in the Care Plan section)  Acute Rehab PT Goals PT Goal Formulation: All assessment and education complete, DC therapy    Frequency       Co-evaluation               AM-PAC PT "6 Clicks" Mobility  Outcome Measure Help needed turning from your back to your side while in a flat bed without using bedrails?: None Help needed moving from lying on your back to sitting on the side of a flat bed without using bedrails?: None Help needed moving to and from a bed to a chair (including a wheelchair)?: None Help needed standing up from a chair using your arms (e.g., wheelchair or bedside chair)?: None Help needed to walk in hospital room?: A Anacker Help needed climbing 3-5 steps with a railing? : A Saner 6 Click Score: 22    End of Session Equipment Utilized During Treatment: Oxygen Activity Tolerance: Patient tolerated treatment well Patient left: in bed;with call bell/phone within reach;with family/visitor present   PT Visit Diagnosis: Difficulty in walking, not elsewhere classified (R26.2)    Time: 1050-1105 PT Time Calculation (min) (ACUTE ONLY): 15 min   Charges:   PT Evaluation $PT Eval Low Complexity: 1 Low   PT General Charges $$ ACUTE PT VISIT: 1 Visit        Thomasene Mohair PT, DPT Physical Therapist Acute Rehabilitation Services Office: [phone]   Janan Halter [place] 09/13/2023, 11:54 AM

## 2023-09-13 NOTE — Progress Notes (Signed)
 PROGRESS NOTE    Lynn Estrada  WUJ:[id number] DOB: [birth year] DOA: 09/12/2023 PCP: Porfirio Oar, PA    Brief Narrative:   Lynn Estrada is a 63 y.o. 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  Patient endorses productive cough with report of blood tinged sputum.  With nausea and vomiting.  Recently hospitalized 2/21 through 2/25 for UTI and community-acquired pneumonia, also with concern for decompensated CHF and was diuresed with Lasix.  [name] was held following hospitalization but resumed in the outpatient setting after seeing cardiology outpatient.  Has not been on Lasix since her hospitalization, unclear why this is.  In the ED, temperature 100.4 F, HR 109, RR 26, BP 134/95, SpO2 83% on room air and placed on 3 L nasal cannula.  WBC 10.1, hemoglobin 9.0, platelet count 228.  Sodium 139, potassium 4.0, chloride 1 3, CO2 23, glucose 138, BUN 14, creatinine 0.95.  AST 19, ALT 24, total bilirubin 1.2.  High sensitive troponin 35>39.  BNP 1151.1.  Lactic acid 1.0.  COVID/influenza/RSV PCR negative.  Digoxin level 0.4.  Chest x-ray with bilateral airspace disease similar to last month, focally dense appearance favoring pneumonia over alveolar edema, chronic cardiomegaly.  CT angiogram chest negative for pulmonary embolism, noted multilobar pneumonia, marked cardiomegaly.  Patient was started on empiric antibiotics.  Cardiology was consulted for concern of component of CHF causing hypoxia.  TRH consulted for admission.  Assessment & Plan:   Acute hypoxic respiratory failure, POA Community-acquired pneumonia Patient presenting with progressive shortness of breath, cough, fatigue, body aches.  Recently hospitalized for similar and treated with antibiotics last month.   Patient with fever up to 100.4 in the ED, tachycardic, tachypneic and hypoxic with SpO2 83% on room air.  Imaging consistent with multilobar pneumonia.  MRSA PCR negative. -- WBC 10.1>7.5 -- Tmax 103.1 past 24 hours -- Cefepime 2 g IV q8h -- Mucinex 644m gPO q12h -- Robitussin as needed cough -- Albuterol neb every 2 hours.  Wheezing/shortness of breath -- Continue supplemental oxygen, currently on 3 L nasal cannula, wean as able -- CBC in the a.m.  Positive blood culture 1 out of 4 blood cultures positive for staph species per [facility] ID, suspect Staphylococcus epidermidis contaminant. -- Continue to monitor blood cultures and await further identification  Combined chronic systolic/diastolic congestive heart failure Hx essential hypertension Hx BiV PPM Patient reports productive cough with blood-tinged sputum; which would be more consistent with pulmonary edema.  BNP elevated on admission 1500.  LVEF 20%.  Follows with heart failure service outpatient.  Recently hospitalized and diuresis with Lasix but did not continue on discharge.  Recently restarted [facility] outpatient. -- Cardiology following, appreciate assistance -- Digoxin 125 mcg p.o. daily -- Bisoprolol 2.5 mg p.o. daily -- Spironolactone 25 mg p.o. daily -- Entresto discontinued due to hypotension -- Strict I's and O's Daily weights -- BMP daily -- Further per cardiology  Type 2 diabetes mellitus Hemoglobin A1c 7.1 on 05/24/2023. On metformin 5 mg p.o. twice daily outpatient.  -- Hold oral hypoglycemics while inpatient -- Moderate SSI for coverage -- CBG before every meal/at bedtime  Hyperlipidemia -- Atorvastatin 10 mg p.o. daily   DVT prophylaxis: enoxaparin (LOVENOX) injection 40 mg Start: 09/12/23 0800    Code Status: Full Code Family Communication: No family present bedside this morning  Disposition Plan:  Level of care: Progressive Status is: Inpatient Remains inpatient appropriate because: IV antibiotics,  weaning from supplemental oxygen    Consultants:  Cardiology  Procedures:  None  Antimicrobials:  Vancomycin 3/13 - 3/13 Cefepime 3/12>>   Subjective: Patient seen examined bedside, sitting in bedside chair.  Continues with productive cough of blood-tinged sputum.  Concerned about recurrence of pneumonia.  Asking for transfer to [facility] so "someone can figure out what is going on".  Discussed with her that there is no need for higher level of care as no specialty needs are necessary for her pneumonia treatment.  RN reporting patient with hypotension, did receive spironolactone this morning but will hold/discontinue Entresto for now.  Seen by cardiology, although sputum more consistent with pulmonary edema given its bloodstained, elevated BNP as well as review of their CorVue that would indicate a CH F component to her hypoxia, they do not believe that her CHF is not decompensated at this time.  Patient with no respiratory complaints, concerns or questions at this time.  Denies headache, no visual changes, no chest pain, no palpitations, no abdominal pain, no current fever, no chills/night sweats, no current nausea/vomiting, no diarrhea, no focal weakness, no fatigue, no paresthesia.  No acute events overnight per nursing.  Objective: Vitals:   09/13/23 0335 09/13/23 0426 09/13/23 0900 09/13/23 0955  BP: (!) 101/58  (!) 85/48 (!) 96/58  Pulse: (!) 103  96 93  Resp: 18     Temp: (!) 103.1 F (39.5 C) 98.4 F (36.9 C) 98.6 F (37 C)   TempSrc: Oral Axillary Oral   SpO2: 96%  99%   Weight:      Height:        Intake/Output Summary (Last 24 hours) at 09/13/2023 1129 Last data filed at 09/13/2023 1100 Gross per 24 hour  Intake 637 ml  Output --  Net 637 ml   Filed Weights   09/12/23 0359  Weight: 92 kg    Examination:  Physical Exam: GEN: NAD, alert and oriented x 3, chronically ill in appearance HEENT: NCAT, PERRL, EOMI, sclera clear, MMM PULM: Breath sounds diminished  bilateral bases with rhonchi, no wheezing, normal respiratory effort with accessory muscle use, on 3 L nasal cannula with SpO2 99% at rest CV: RRR w/o M/G/R GI: abd soft, NTND, + BS MSK: Trace bilateral lower extremity pedal edema, moves all extremities independently with preserved muscle strength NEURO: No focal neurological deficit PSYCH: normal mood/affect Integumentary: Dry patch of skin RUE, otherwise no other concerning rashes/lesions/wounds noted on exposed skin surfaces    Data Reviewed: I have personally reviewed following labs and imaging studies  CBC: Recent Labs  Lab 09/12/23 0550 09/13/23 0337  WBC 10.1 7.5  NEUTROABS 8.8*  --   HGB 9.0* 9.5*  HCT 29.0* 31.3*  MCV 90.6 92.1  PLT 228 220   Basic Metabolic Panel: Recent Labs  Lab 09/12/23 0454 09/13/23 0337  NA 139 136  K 4.0 3.7  CL 103 103  CO2 23 24  GLUCOSE 138* 114*  BUN 14 16  CREATININE 0.95 1.05*  CALCIUM 9.2 8.5*   GFR: Estimated Creatinine Clearance: 72.8 mL/min (A) (by C-G formula based on SCr of 1.05 mg/dL (H)). Liver Function Tests: Recent Labs  Lab 09/12/23 0454  AST 19  ALT 24  ALKPHOS 51  BILITOT 1.2  PROT 7.4  ALBUMIN 3.9   No results for input(s): "LIPASE", "AMYLASE" in the last 168 hours. No results for input(s): "AMMONIA" in the  last 168 hours. Coagulation Profile: No results for input(s): "INR", "PROTIME" in the last 168 hours. Cardiac Enzymes: No results for input(s): "CKTOTAL", "CKMB", "CKMBINDEX", "TROPONINI" in the last 168 hours. BNP (last 3 results) No results for input(s): "PROBNP" in the last 8760 hours. HbA1C: No results for input(s): "HGBA1C" in the last 72 hours. CBG: Recent Labs  Lab 09/12/23 0818 09/12/23 1319 09/12/23 1642 09/12/23 2034 09/13/23 0741  GLUCAP 121* 119* 89 141* 97   Lipid Profile: No results for input(s): "CHOL", "HDL", "LDLCALC", "TRIG", "CHOLHDL", "LDLDIRECT" in the last 72 hours. Thyroid Function Tests: No results for input(s):  "TSH", "T4TOTAL", "FREET4", "T3FREE", "THYROIDAB" in the last 72 hours. Anemia Panel: No results for input(s): "VITAMINB12", "FOLATE", "FERRITIN", "TIBC", "IRON", "RETICCTPCT" in the last 72 hours. Sepsis Labs: Recent Labs  Lab 09/12/23 0553  LATICACIDVEN 1.0    Recent Results (from the past 240 hours)  Resp panel by RT-PCR (RSV, Flu A&B, Covid) Anterior Nasal Swab     Status: None   Collection Time: 09/12/23  4:54 AM   Specimen: Anterior Nasal Swab  Result Value Ref Range Status   SARS Coronavirus 2 by RT PCR NEGATIVE NEGATIVE Final    Comment: (NOTE) SARS-CoV-2 target nucleic acids are NOT DETECTED.  The SARS-CoV-2 RNA is generally detectable in upper respiratory specimens during the acute phase of infection. The lowest concentration of SARS-CoV-2 viral copies this assay can detect is 138 copies/mL. A negative result does not preclude SARS-Cov-2 infection and should not be used as the sole basis for treatment or other patient management decisions. A negative result may occur with  improper specimen collection/handling, submission of specimen other than nasopharyngeal swab, presence of viral mutation(s) within the areas targeted by this assay, and inadequate number of viral copies(<138 copies/mL). A negative result must be combined with clinical observations, patient history, and epidemiological information. The expected result is Negative.  Fact Sheet for Patients:  BloggerCourse.com  Fact Sheet for Healthcare Providers:  SeriousBroker.it  This test is no t yet approved or cleared by the [place] FDA and  has been authorized for detection and/or diagnosis of SARS-CoV-2 by FDA under an Emergency Use Authorization (EUA). This EUA will remain  in effect (meaning this test can be used) for the duration of the COVID-19 declaration under Section 564(b)(1) of the Act, 21 U.S.C.section 360bbb-3(b)(1), unless the authorization  is terminated  or revoked sooner.       Influenza A by PCR NEGATIVE NEGATIVE Final   Influenza B by PCR NEGATIVE NEGATIVE Final    Comment: (NOTE) The Xpert Xpress SARS-CoV-2/FLU/RSV plus assay is intended as an aid in the diagnosis of influenza from Nasopharyngeal swab specimens and should not be used as a sole basis for treatment. Nasal washings and aspirates are unacceptable for Xpert Xpress SARS-CoV-2/FLU/RSV testing.  Fact Sheet for Patients: BloggerCourse.com  Fact Sheet for Healthcare Providers: SeriousBroker.it  This test is not yet approved or cleared by the [place] FDA and has been authorized for detection and/or diagnosis of SARS-CoV-2 by FDA under an Emergency Use Authorization (EUA). This EUA will remain in effect (meaning this test can be used) for the duration of the COVID-19 declaration under Section 564(b)(1) of the Act, 21 U.S.C. section 360bbb-3(b)(1), unless the authorization is terminated or revoked.     Resp Syncytial Virus by PCR NEGATIVE NEGATIVE Final    Comment: (NOTE) Fact Sheet for Patients: BloggerCourse.com  Fact Sheet for Healthcare Providers: SeriousBroker.it  This test is not yet approved or cleared by  the [employer] and has been authorized for detection and/or diagnosis of SARS-CoV-2 by FDA under an Emergency Use Authorization (EUA). This EUA will remain in effect (meaning this test can be used) for the duration of the COVID-19 declaration under Section 564(b)(1) of the Act, 21 U.S.C. section 360bbb-3(b)(1), unless the authorization is terminated or revoked.  Performed at [facility], [address] [place], [place] [address]   MRSA Next Gen by PCR, Nasal     Status: None   Collection Time: 09/12/23  6:01 AM   Specimen: Nasal Mucosa; Nasal Swab  Result Value Ref Range Status   MRSA by PCR Next Gen NOT  DETECTED NOT DETECTED Final    Comment: (NOTE) The GeneXpert MRSA Assay (FDA approved for NASAL specimens only), is one component of a comprehensive MRSA colonization surveillance program. It is not intended to diagnose MRSA infection nor to guide or monitor treatment for MRSA infections. Test performance is not FDA approved in patients less than 63 years old. Performed at [facility], [address], [place], [place] [address]   Blood culture (routine x 2)     Status: None (Preliminary result)   Collection Time: 09/12/23  6:23 AM   Specimen: BLOOD  Result Value Ref Range Status   Specimen Description   Final    BLOOD BLOOD RIGHT HAND Performed at [facility], [address], [place], [place] [address]    Special Requests   Final    BOTTLES DRAWN AEROBIC AND ANAEROBIC Blood Culture results may not be optimal due to an inadequate volume of blood received in culture bottles Performed at [facility], [address], [place], [place] [address]    Culture   Final    NO GROWTH < 24 HOURS Performed at [facility], [address] [place], [place] [address]    Report Status PENDING  Incomplete  Blood culture (routine x 2)     Status: None (Preliminary result)   Collection Time: 09/12/23  6:27 AM   Specimen: BLOOD LEFT FOREARM  Result Value Ref Range Status   Specimen Description   Final    BLOOD LEFT FOREARM Performed at [facility], [address] [place], [place] [address]    Special Requests   Final    BOTTLES DRAWN AEROBIC AND ANAEROBIC Blood Culture results may not be optimal due to an inadequate volume of blood received in culture bottles Performed at [facility], [address] [place], [place] [address]    Culture  Setup Time   Final    GRAM POSITIVE COCCI ANAEROBIC BOTTLE ONLY CRITICAL RESULT CALLED TO, READ BACK BY AND VERIFIED WITH: L POINDEXTER,[email or web address]  09/13/23  MK Performed at [facility], [address] [place], [place] [address]    Culture GRAM POSITIVE COCCI  Final   Report Status PENDING  Incomplete  Blood Culture ID Panel (Reflexed)     Status: Abnormal   Collection Time: 09/12/23  6:27 AM  Result Value Ref Range Status   Enterococcus faecalis NOT DETECTED NOT DETECTED Final   Enterococcus Faecium NOT DETECTED NOT DETECTED Final   Listeria monocytogenes NOT DETECTED NOT DETECTED Final   Staphylococcus species DETECTED (A) NOT DETECTED Final    Comment: CRITICAL RESULT CALLED TO, READ BACK BY AND VERIFIED WITH: L POINDEXTER,[email or web address]  09/13/23 MK    Staphylococcus aureus (BCID) NOT DETECTED NOT DETECTED Final   Staphylococcus epidermidis NOT DETECTED NOT DETECTED  Final   Staphylococcus lugdunensis NOT DETECTED NOT DETECTED Final   Streptococcus species NOT DETECTED NOT DETECTED Final   Streptococcus agalactiae NOT DETECTED NOT DETECTED Final   Streptococcus pneumoniae NOT DETECTED NOT DETECTED Final   Streptococcus pyogenes NOT DETECTED NOT DETECTED Final   A.calcoaceticus-baumannii NOT DETECTED NOT DETECTED Final   Bacteroides fragilis NOT DETECTED NOT DETECTED Final   Enterobacterales NOT DETECTED NOT DETECTED Final   Enterobacter cloacae complex NOT DETECTED NOT DETECTED Final   Escherichia coli NOT DETECTED NOT DETECTED Final   Klebsiella aerogenes NOT DETECTED NOT DETECTED Final   Klebsiella oxytoca NOT DETECTED NOT DETECTED Final   Klebsiella pneumoniae NOT DETECTED NOT DETECTED Final   Proteus species NOT DETECTED NOT DETECTED Final   Salmonella species NOT DETECTED NOT DETECTED Final   Serratia marcescens NOT DETECTED NOT DETECTED Final   Haemophilus influenzae NOT DETECTED NOT DETECTED Final   Neisseria meningitidis NOT DETECTED NOT DETECTED Final   Pseudomonas aeruginosa NOT DETECTED NOT DETECTED Final   Stenotrophomonas maltophilia NOT DETECTED NOT DETECTED Final   Candida albicans NOT DETECTED NOT DETECTED Final    Candida auris NOT DETECTED NOT DETECTED Final   Candida glabrata NOT DETECTED NOT DETECTED Final   Candida krusei NOT DETECTED NOT DETECTED Final   Candida parapsilosis NOT DETECTED NOT DETECTED Final   Candida tropicalis NOT DETECTED NOT DETECTED Final   Cryptococcus neoformans/gattii NOT DETECTED NOT DETECTED Final    Comment: Performed at [facility], [address] [place], [place] [address]         Radiology Studies: CT Angio Chest PE W and/or Wo Contrast Result Date: 09/12/2023 CLINICAL DATA:  Recent admission for cough, fatigue, and body aches. Pulmonary embolism suspected, high probability. EXAM: CT ANGIOGRAPHY CHEST WITH CONTRAST TECHNIQUE: Multidetector CT imaging of the chest was performed using the standard protocol during bolus administration of intravenous contrast. Multiplanar CT image reconstructions and MIPs were obtained to evaluate the vascular anatomy. RADIATION DOSE REDUCTION: This exam was performed according to the departmental dose-optimization program which includes automated exposure control, adjustment of the mA and/or kV according to patient size and/or use of iterative reconstruction technique. CONTRAST:  75mL OMNIPAQUE IOHEXOL 350 MG/ML SOLN COMPARISON:  Preceding chest radiograph. FINDINGS: Cardiovascular: Satisfactory opacification of the pulmonary arteries to the segmental level. No evidence of pulmonary embolism. Enlarged heart size with biventricular pacer in stable position by report preceding radiograph. No significant pericardial effusion. There is atheromatous calcification of the aorta and left main. Enlarged main pulmonary artery at 3.6 cm. This usually reflects pulmonary hypertension. Mediastinum/Nodes: Mild thickening of mediastinal lymph nodes which is likely reactive in this setting. Lungs/Pleura: Bilateral airspace disease affecting all lobes. There is some interlobular septal thickening and alveolar edema is considered but history and patchy  distribution is more suggestive of pneumonia. Upper Abdomen: Atheromatous calcification. Musculoskeletal: Thoracic spine degeneration. Review of the MIP images confirms the above findings. IMPRESSION: 1. Multi lobar pneumonia. 2. No pulmonary embolism. 3. Chronic marked cardiomegaly. Electronically Signed   By: Tiburcio Pea M.D.   On: 09/12/2023 06:24   DG Chest 2 View Result Date: 09/12/2023 CLINICAL DATA:  Shortness of breath and cough EXAM: CHEST - 2 VIEW COMPARISON:  08/23/2023 FINDINGS: Bilateral airspace opacity similar to prior. Cardiopericardial enlargement with stable positioning of biventricular pacer from the left. No visible effusion or pneumothorax. IMPRESSION: Bilateral airspace disease similar to comparison last month, focally dense appearance favoring pneumonia over alveolar edema. Chronic cardiomegaly. Electronically Signed   By: Christiane Ha  Watts M.D.   On: 09/12/2023 04:49        Scheduled Meds:  bisoprolol  2.5 mg Oral Daily   clopidogrel  75 mg Oral Daily   digoxin  125 mcg Oral Daily   enoxaparin (LOVENOX) injection  40 mg Subcutaneous Q24H   feeding supplement  237 mL Oral BID BM   insulin aspart  0-15 Units Subcutaneous TID WC   insulin aspart  0-5 Units Subcutaneous QHS   sacubitril-valsartan  1 tablet Oral BID   spironolactone  25 mg Oral Daily   Continuous Infusions:  ceFEPime (MAXIPIME) IV 2 g (09/13/23 0634)     LOS: 1 day    Time spent: 52 minutes spent on 09/13/2023 caring for this patient face-to-face including chart review, ordering labs/tests, documenting, discussion with nursing staff, consultants, updating family and interview/physical exam    Alvira Philips [place], DO [facility] Available via Epic secure chat 7am-7pm After these hours, please refer to coverage provider listed on [email or web address] 09/13/2023, 11:29 AM

## 2023-09-13 NOTE — TOC Initial Note (Signed)
 Transition of Care [facility]) - Initial/Assessment Note   Patient Details  Name: Lynn Estrada MRN: [id number] Date of Birth: Oct 20, 1960  [facility] CM/SW Contact:    Ewing Schlein, LCSW Phone Number: 09/13/2023, 1:50 PM  Clinical Narrative: PT evaluation recommended OPPT. CSW met with patient to discuss recommendations. Per patient, she was set up at [facility] but did not make it to her first appointment since she was admitted to the hospital. Patient reported she plans to reschedule her appointment after she discharges home.  Expected Discharge Plan: OP Rehab Barriers to Discharge: Continued Medical Work up  Patient Goals and CMS Choice Patient states their goals for this hospitalization and ongoing recovery are:: Reschedule OPPT after discharge Choice offered to / list presented to : NA  Expected Discharge Plan and Services In-house Referral: Clinical Social Work Living arrangements for the past 2 months: Single Family Home            DME Arranged: N/A DME Agency: NA  Prior Living Arrangements/Services Living arrangements for the past 2 months: Single Family Home Lives with:: Spouse Patient language and need for interpreter reviewed:: Yes Do you feel safe going back to the place where you live?: Yes      Need for Family Participation in Patient Care: No (Comment) Care giver support system in place?: Yes (comment) Current home services: DME (Cane, walker) Criminal Activity/Legal Involvement Pertinent to Current Situation/Hospitalization: No - Comment as needed  Activities of Daily Living ADL Screening (condition at time of admission) Independently performs ADLs?: Yes (appropriate for developmental age) Does the patient have a NEW difficulty with bathing/dressing/toileting/self-feeding that is expected to last >3 days?: Yes (Initiates electronic notice to provider for possible OT consult) Does the patient have a NEW difficulty with getting in/out of bed,  walking, or climbing stairs that is expected to last >3 days?: Yes (Initiates electronic notice to provider for possible PT consult) Does the patient have a NEW difficulty with communication that is expected to last >3 days?: No Is the patient deaf or have difficulty hearing?: No Does the patient have difficulty seeing, even when wearing glasses/contacts?: No Does the patient have difficulty concentrating, remembering, or making decisions?: No  Emotional Assessment Appearance:: Appears stated age Attitude/Demeanor/Rapport: Engaged Affect (typically observed): Appropriate Orientation: : Oriented to Self, Oriented to Place, Oriented to  Time, Oriented to Situation Alcohol / Substance Use: Not Applicable Psych Involvement: No (comment)  Admission diagnosis:  HCAP (healthcare-associated pneumonia) [J18.9] Multifocal pneumonia [J18.9] Patient Active Problem List   Diagnosis Date Noted   HCAP (healthcare-associated pneumonia) 09/12/2023   Acute on chronic combined systolic and diastolic heart failure (HCC) 08/23/2023   Frequency of urination and polyuria 08/23/2023   Cerebrovascular accident (CVA) (HCC) 05/26/2023   Pyuria 05/25/2023   Left-sided weakness 05/24/2023   Epistaxis 04/22/2023   Benign neoplasm of cecum 03/26/2023   Benign neoplasm of descending colon 03/26/2023   Benign neoplasm of sigmoid colon 03/26/2023   Adhesive capsulitis of right shoulder 08/24/2022   Mass of right hand 02/22/2022   LBBB (left bundle branch block) 02/01/2022   Type 2 diabetes mellitus with hyperglycemia, without long-term current use of insulin (HCC) 01/22/2022   Hypertriglyceridemia 01/21/2022   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 10/26/2021   Chronic combined systolic and diastolic heart failure (HCC) 07/27/2021   Acute combined systolic (congestive) and diastolic (congestive) heart failure (HCC)    Hypocalcemia 07/12/2021   Elevated troponin 07/12/2021   Lipoma of hand  07/20/2020   Osteoarthritis of carpometacarpal (CMC) joint of thumb 07/20/2020   Pain in right hand 07/20/2020   Radial styloid tenosynovitis 07/20/2020   Other osteoarthritis of spine 10/23/2019   Suspected COVID-19 virus infection 06/11/2019   Acute respiratory failure with hypoxia (HCC) 06/11/2019   Multifocal pneumonia 06/11/2019   Carpal tunnel syndrome, bilateral 04/24/2019   Ureteral stone with hydronephrosis 12/31/2016   Hx of adenomatous colonic polyps 08/03/2016   Normocytic anemia 07/10/2016   CAP (community acquired pneumonia) 07/06/2016   Essential hypertension 07/26/2015   Eczema 07/26/2015   Overweight (BMI 25.0-29.9) 07/26/2015   PCP:  Porfirio Oar, PA Pharmacy:   [facility] [phone] [address], [place] - [address] [place] [address] Phone: [phone] Fax: [phone]  Social Drivers of Health (SDOH) Social History: SDOH Screenings   Food Insecurity: No Food Insecurity (09/12/2023)  Housing: Low Risk  (09/12/2023)  Transportation Needs: No Transportation Needs (09/12/2023)  Utilities: Not At Risk (09/12/2023)  Depression (PHQ2-9): Low Risk  (03/16/2022)  Financial Resource Strain: Low Risk  (08/28/2023)   Received from [facility]  Physical Activity: Insufficiently Active (08/20/2022)   Received from [facility], [facility]  Social Connections: Socially Integrated (08/20/2022)   Received from [facility], [facility]  Stress: No Stress Concern Present (08/20/2022)   Received from [facility], [facility]  Tobacco Use: Medium Risk (09/12/2023)   SDOH Interventions:    Readmission Risk Interventions     No data to display

## 2023-09-13 NOTE — Progress Notes (Signed)
 PHARMACY - PHYSICIAN COMMUNICATION CRITICAL VALUE ALERT - BLOOD CULTURE IDENTIFICATION (BCID)  Lynn Estrada is an 63 y.o. female who presented to [facility] on 09/12/2023 with pneumonia.  Assessment:  BCID + Staph species in 1/4 bottles  Name of physician (or Provider) Contacted: Anthoney Harada, FNP  Current antibiotics: Vancomycin, Cefepime  Changes to prescribed antibiotics recommended:  No changes at this time - continue current antibiotics  Results for orders placed or performed during the hospital encounter of 09/12/23  Blood Culture ID Panel (Reflexed) (Collected: 09/12/2023  6:27 AM)  Result Value Ref Range   Enterococcus faecalis NOT DETECTED NOT DETECTED   Enterococcus Faecium NOT DETECTED NOT DETECTED   Listeria monocytogenes NOT DETECTED NOT DETECTED   Staphylococcus species DETECTED (A) NOT DETECTED   Staphylococcus aureus (BCID) NOT DETECTED NOT DETECTED   Staphylococcus epidermidis NOT DETECTED NOT DETECTED   Staphylococcus lugdunensis NOT DETECTED NOT DETECTED   Streptococcus species NOT DETECTED NOT DETECTED   Streptococcus agalactiae NOT DETECTED NOT DETECTED   Streptococcus pneumoniae NOT DETECTED NOT DETECTED   Streptococcus pyogenes NOT DETECTED NOT DETECTED   A.calcoaceticus-baumannii NOT DETECTED NOT DETECTED   Bacteroides fragilis NOT DETECTED NOT DETECTED   Enterobacterales NOT DETECTED NOT DETECTED   Enterobacter cloacae complex NOT DETECTED NOT DETECTED   Escherichia coli NOT DETECTED NOT DETECTED   Klebsiella aerogenes NOT DETECTED NOT DETECTED   Klebsiella oxytoca NOT DETECTED NOT DETECTED   Klebsiella pneumoniae NOT DETECTED NOT DETECTED   Proteus species NOT DETECTED NOT DETECTED   Salmonella species NOT DETECTED NOT DETECTED   Serratia marcescens NOT DETECTED NOT DETECTED   Haemophilus influenzae NOT DETECTED NOT DETECTED   Neisseria meningitidis NOT DETECTED NOT DETECTED   Pseudomonas aeruginosa NOT DETECTED NOT DETECTED   Stenotrophomonas  maltophilia NOT DETECTED NOT DETECTED   Candida albicans NOT DETECTED NOT DETECTED   Candida auris NOT DETECTED NOT DETECTED   Candida glabrata NOT DETECTED NOT DETECTED   Candida krusei NOT DETECTED NOT DETECTED   Candida parapsilosis NOT DETECTED NOT DETECTED   Candida tropicalis NOT DETECTED NOT DETECTED   Cryptococcus neoformans/gattii NOT DETECTED NOT DETECTED    Maryellen Pile, PharmD 09/13/2023  6:38 AM

## 2023-09-14 DIAGNOSIS — J189 Pneumonia, unspecified organism: Secondary | ICD-10-CM | POA: Diagnosis not present

## 2023-09-14 LAB — CBC
HCT: 27.7 % — ABNORMAL LOW (ref 36.0–46.0)
Hemoglobin: 8.3 g/dL — ABNORMAL LOW (ref 12.0–15.0)
MCH: 28 pg (ref 26.0–34.0)
MCHC: 30 g/dL (ref 30.0–36.0)
MCV: 93.6 fL (ref 80.0–100.0)
Platelets: 180 10*3/uL (ref 150–400)
RBC: 2.96 MIL/uL — ABNORMAL LOW (ref 3.87–5.11)
RDW: 14.2 % (ref 11.5–15.5)
WBC: 6.1 10*3/uL (ref 4.0–10.5)
nRBC: 0 % (ref 0.0–0.2)

## 2023-09-14 LAB — BASIC METABOLIC PANEL
Anion gap: 8 (ref 5–15)
BUN: 21 mg/dL (ref 8–23)
CO2: 23 mmol/L (ref 22–32)
Calcium: 8.3 mg/dL — ABNORMAL LOW (ref 8.9–10.3)
Chloride: 104 mmol/L (ref 98–111)
Creatinine, Ser: 0.87 mg/dL (ref 0.44–1.00)
GFR, Estimated: >60 mL/min (ref >60)
Glucose, Bld: 113 mg/dL — ABNORMAL HIGH (ref 70–99)
Potassium: 4.1 mmol/L (ref 3.5–5.1)
Sodium: 135 mmol/L (ref 135–145)

## 2023-09-14 LAB — CULTURE, BLOOD (ROUTINE X 2)

## 2023-09-14 LAB — GLUCOSE, CAPILLARY
Glucose-Capillary: 112 mg/dL — ABNORMAL HIGH (ref 70–99)
Glucose-Capillary: 210 mg/dL — ABNORMAL HIGH (ref 70–99)
Glucose-Capillary: 103 mg/dL — ABNORMAL HIGH (ref 70–99)
Glucose-Capillary: 102 mg/dL — ABNORMAL HIGH (ref 70–99)

## 2023-09-14 LAB — BRAIN NATRIURETIC PEPTIDE: B Natriuretic Peptide: 481.1 pg/mL — ABNORMAL HIGH (ref 0.0–100.0)

## 2023-09-14 MED ORDER — AZITHROMYCIN 250 MG PO TABS
500.0000 mg | ORAL_TABLET | Freq: Every day | ORAL | Status: DC
  Administered 2023-09-14 – 2023-09-17 (×4): 500 mg via ORAL
  Filled 2023-09-14 (×4): qty 2

## 2023-09-14 MED ORDER — HYDROCOD POLI-CHLORPHE POLI ER 10-8 MG/5ML PO SUER
5.0000 mL | Freq: Once | ORAL | Status: AC
  Administered 2023-09-14: 5 mL via ORAL
  Filled 2023-09-14: qty 5

## 2023-09-14 MED ORDER — MIDODRINE HCL 5 MG PO TABS
5.0000 mg | ORAL_TABLET | Freq: Three times a day (TID) | ORAL | Status: DC
  Administered 2023-09-14 – 2023-09-17 (×11): 5 mg via ORAL
  Filled 2023-09-14 (×11): qty 1

## 2023-09-14 MED ORDER — HYDROCOD POLI-CHLORPHE POLI ER 10-8 MG/5ML PO SUER
5.0000 mL | Freq: Two times a day (BID) | ORAL | Status: DC | PRN
  Administered 2023-09-14 – 2023-09-16 (×4): 5 mL via ORAL
  Filled 2023-09-14 (×4): qty 5

## 2023-09-14 NOTE — Progress Notes (Signed)
 Pharmacy Antibiotic Note  Lynn Estrada is a 63 y.o. female admitted on 09/12/2023 with pneumonia.  Pharmacy has been consulted for Cefepime dosing.  ID: HCAP Afebrile, WBC 10, Scr <1  3/13 Vanco >> 3/14 3/13 Cefepime>>  3/13 Bld Cx: Staph species (1/4 bottles) 3/13 MRSA PCR: not detected 3/13 resp panel: negative  Plan: Con't Cefepime 2g IV q8hr. Dose ok for CrCl>60 Pharmacy will sign off. Please reconsult for further dosing assitance.   Height: 6\' 1"  (185.4 cm) Weight: 95.3 kg (210 lb 1.6 oz) IBW/kg (Calculated) : 75.4  Temp (24hrs), Avg:100.2 F (37.9 C), Min:98.2 F (36.8 C), Max:102.7 F (39.3 C)  Recent Labs  Lab 09/12/23 0454 09/12/23 0550 09/12/23 0553 09/13/23 0337 09/14/23 0351  WBC  --  10.1  --  7.5 6.1  CREATININE 0.95  --   --  1.05* 0.87  LATICACIDVEN  --   --  1.0  --   --     Estimated Creatinine Clearance: 89.4 mL/min (by C-G formula based on SCr of 0.87 mg/dL).    Allergies  Allergen Reactions   Latex Rash and Hives   Bactrim [Sulfamethoxazole-Trimethoprim] Rash   Carvedilol Itching   Metoprolol Itching and Rash    Yalissa Fink S. Merilynn Finland, PharmD, BCPS Clinical Staff Pharmacist  Misty Stanley Stillinger 09/14/2023 10:40 AM

## 2023-09-14 NOTE — Progress Notes (Signed)
 OT Cancellation Note  Patient Details Name: Lynn Estrada MRN: [id number] DOB: 1960/12/31   Cancelled Treatment:    Reason Eval/Treat Not Completed: Patient declined, no reason specified Patient declined reporting fatigue from coughing all night.nurse present in room at this time.  OT to continue to follow and check back on 3/16.  [name], MS Acute Rehabilitation Department Office# [phone]  09/14/2023, 9:14 AM

## 2023-09-14 NOTE — Progress Notes (Signed)
 PROGRESS NOTE    BRENDALIZ KUK  ZOX:[id number] DOB: 12/17/1960 DOA: 09/12/2023 PCP: Porfirio Oar, PA    Brief Narrative:   Lynn Estrada is a 63 y.o. 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  Patient endorses productive cough with report of blood tinged sputum.  With nausea and vomiting.  Recently hospitalized 2/21 through 2/25 for UTI and community-acquired pneumonia, also with concern for decompensated CHF and was diuresed with Lasix.  [name] was held following hospitalization but resumed in the outpatient setting after seeing cardiology outpatient.  Has not been on Lasix since her hospitalization, unclear why this is.  In the ED, temperature 100.4 F, HR 109, RR 26, BP 134/95, SpO2 83% on room air and placed on 3 L nasal cannula.  WBC 10.1, hemoglobin 9.0, platelet count 228.  Sodium 139, potassium 4.0, chloride 1 3, CO2 23, glucose 138, BUN 14, creatinine 0.95.  AST 19, ALT 24, total bilirubin 1.2.  High sensitive troponin 35>39.  BNP 1151.1.  Lactic acid 1.0.  COVID/influenza/RSV PCR negative.  Digoxin level 0.4.  Chest x-ray with bilateral airspace disease similar to last month, focally dense appearance favoring pneumonia over alveolar edema, chronic cardiomegaly.  CT angiogram chest negative for pulmonary embolism, noted multilobar pneumonia, marked cardiomegaly.  Patient was started on empiric antibiotics.  Cardiology was consulted for concern of component of CHF causing hypoxia.  TRH consulted for admission.  Assessment & Plan:   Acute hypoxic respiratory failure, POA Community-acquired pneumonia Patient presenting with progressive shortness of breath, cough, fatigue, body aches.  Recently hospitalized for similar and treated with antibiotics last month.   Patient with fever up to 100.4 in the ED, tachycardic, tachypneic and hypoxic with SpO2 83% on room air.  Imaging consistent with multilobar pneumonia.  MRSA PCR negative. -- WBC 10.1>7.5 -- Tmax 102.7 past 24 hours -- Cefepime 2 g IV q8h -- Mucinex 600mg  PO q12h -- Tussionex every 12 hours as needed cough -- Albuterol neb every 2 hours.  Wheezing/shortness of breath -- Continue supplemental oxygen, currently on 3 L nasal cannula, SpO2 98%; wean as able -- Ambulatory O2 screen today -- CBC in the a.m.  Positive blood culture 1 out of 4 blood cultures positive for staph species per BCID, suspect Staphylococcus epidermidis contaminant. -- Continue to monitor blood cultures and await further identification  Combined chronic systolic/diastolic congestive heart failure Hx essential hypertension Hx BiV PPM Patient reports productive cough with blood-tinged sputum; which would be more consistent with pulmonary edema.  BNP elevated on admission 1500.  LVEF 20%.  Follows with heart failure service outpatient.  Recently hospitalized and diuresis with Lasix but did not continue on discharge.  Recently restarted [facility] outpatient. -- Cardiology following peripherally now -- Digoxin 125 mcg p.o. daily -- Bisoprolol 2.5 mg p.o. daily -- Spironolactone 25 mg p.o. daily -- Entresto discontinued due to hypotension -- Strict I's and O's Daily weights -- BMP daily  Type 2 diabetes mellitus Hemoglobin A1c 7.1 on 05/24/2023. On metformin 5 mg p.o. twice daily outpatient.  -- Hold oral hypoglycemics while inpatient -- Moderate SSI for coverage -- CBG before every meal/at bedtime  Hyperlipidemia -- Atorvastatin 10 mg p.o. daily   DVT prophylaxis: enoxaparin (LOVENOX) injection 40 mg Start: 09/12/23 0800    Code Status: Full Code Family Communication: No family  present bedside this morning  Disposition Plan:  Level of care: Progressive Status is: Inpatient Remains inpatient appropriate  because: IV antibiotics, weaning from supplemental oxygen    Consultants:  Cardiology  Procedures:  None  Antimicrobials:  Vancomycin 3/13 - 3/13 Cefepime 3/12>>   Subjective: Patient seen examined bedside, lying in bed.  Complaining of cough, reports controlled better with Tussidex.  Feels tired/fatigued.  Continues with productive cough with blood-tinged sputum.  Cardiology now following peripherally.  Temperature curve improving, Tmax past 24 hours 102.7.  Remains on IV antibiotics with cefepime.  No other specific questions, concerns or complaints at this time. Denies headache, no visual changes, no chest pain, no palpitations, no abdominal pain, no current fever, no chills/night sweats, no current nausea/vomiting, no diarrhea, no focal weakness, no fatigue, no paresthesia.  No acute events overnight per nursing.  Objective: Vitals:   09/13/23 2141 09/13/23 2145 09/14/23 0208 09/14/23 0606  BP: (!) 89/45 (!) 95/56 98/63 91/64   Pulse: 90  (!) 103 90  Resp: 20  (!) 24 (!) 24  Temp: 98.2 F (36.8 C)  99.1 F (37.3 C) 100 F (37.8 C)  TempSrc: Oral  Oral Oral  SpO2:  100% 95% 98%  Weight:    95.3 kg  Height:        Intake/Output Summary (Last 24 hours) at 09/14/2023 1215 Last data filed at 09/14/2023 0300 Gross per 24 hour  Intake 839 ml  Output 900 ml  Net -61 ml   Filed Weights   09/12/23 0359 09/14/23 0606  Weight: 92 kg 95.3 kg    Examination:  Physical Exam: GEN: NAD, alert and oriented x 3, chronically ill in appearance HEENT: NCAT, PERRL, EOMI, sclera clear, MMM PULM: Breath sounds diminished bilateral bases with rhonchi, no wheezing, normal respiratory effort with accessory muscle use, on 3 L nasal cannula with SpO2 98% at rest CV: RRR w/o M/G/R GI: abd soft, NTND, + BS MSK: Trace bilateral lower extremity pedal edema, moves all extremities independently with preserved muscle strength NEURO: No focal neurological deficit PSYCH: normal  mood/affect Integumentary: Dry patch of skin RUE, otherwise no other concerning rashes/lesions/wounds noted on exposed skin surfaces    Data Reviewed: I have personally reviewed following labs and imaging studies  CBC: Recent Labs  Lab 09/12/23 0550 09/13/23 0337 09/14/23 0351  WBC 10.1 7.5 6.1  NEUTROABS 8.8*  --   --   HGB 9.0* 9.5* 8.3*  HCT 29.0* 31.3* 27.7*  MCV 90.6 92.1 93.6  PLT 228 220 180   Basic Metabolic Panel: Recent Labs  Lab 09/12/23 0454 09/13/23 0337 09/14/23 0351  NA 139 136 135  K 4.0 3.7 4.1  CL 103 103 104  CO2 23 24 23   GLUCOSE 138* 114* 113*  BUN 14 16 21   CREATININE 0.95 1.05* 0.87  CALCIUM 9.2 8.5* 8.3*   GFR: Estimated Creatinine Clearance: 89.4 mL/min (by C-G formula based on SCr of 0.87 mg/dL). Liver Function Tests: Recent Labs  Lab 09/12/23 0454  AST 19  ALT 24  ALKPHOS 51  BILITOT 1.2  PROT 7.4  ALBUMIN 3.9   No results for input(s): "LIPASE", "AMYLASE" in the last 168 hours. No results for input(s): "AMMONIA" in the last 168 hours. Coagulation Profile: No results for input(s): "INR", "PROTIME" in the last 168 hours. Cardiac Enzymes: No results for input(s): "CKTOTAL", "CKMB", "CKMBINDEX", "TROPONINI" in the last 168 hours. BNP (last 3 results) No results for input(s): "PROBNP" in the last 8760 hours. HbA1C: No results for  input(s): "HGBA1C" in the last 72 hours. CBG: Recent Labs  Lab 09/13/23 1142 09/13/23 1622 09/13/23 2203 09/14/23 0754 09/14/23 1206  GLUCAP 141* 98 135* 112* 210*   Lipid Profile: No results for input(s): "CHOL", "HDL", "LDLCALC", "TRIG", "CHOLHDL", "LDLDIRECT" in the last 72 hours. Thyroid Function Tests: No results for input(s): "TSH", "T4TOTAL", "FREET4", "T3FREE", "THYROIDAB" in the last 72 hours. Anemia Panel: No results for input(s): "VITAMINB12", "FOLATE", "FERRITIN", "TIBC", "IRON", "RETICCTPCT" in the last 72 hours. Sepsis Labs: Recent Labs  Lab 09/12/23 0553  LATICACIDVEN 1.0     Recent Results (from the past 240 hours)  Resp panel by RT-PCR (RSV, Flu A&B, Covid) Anterior Nasal Swab     Status: None   Collection Time: 09/12/23  4:54 AM   Specimen: Anterior Nasal Swab  Result Value Ref Range Status   SARS Coronavirus 2 by RT PCR NEGATIVE NEGATIVE Final    Comment: (NOTE) SARS-CoV-2 target nucleic acids are NOT DETECTED.  The SARS-CoV-2 RNA is generally detectable in upper respiratory specimens during the acute phase of infection. The lowest concentration of SARS-CoV-2 viral copies this assay can detect is 138 copies/mL. A negative result does not preclude SARS-Cov-2 infection and should not be used as the sole basis for treatment or other patient management decisions. A negative result may occur with  improper specimen collection/handling, submission of specimen other than nasopharyngeal swab, presence of viral mutation(s) within the areas targeted by this assay, and inadequate number of viral copies(<138 copies/mL). A negative result must be combined with clinical observations, patient history, and epidemiological information. The expected result is Negative.  Fact Sheet for Patients:  BloggerCourse.com  Fact Sheet for Healthcare Providers:  SeriousBroker.it  This test is no t yet approved or cleared by the [place] FDA and  has been authorized for detection and/or diagnosis of SARS-CoV-2 by FDA under an Emergency Use Authorization (EUA). This EUA will remain  in effect (meaning this test can be used) for the duration of the COVID-19 declaration under Section 564(b)(1) of the Act, 21 U.S.C.section 360bbb-3(b)(1), unless the authorization is terminated  or revoked sooner.       Influenza A by PCR NEGATIVE NEGATIVE Final   Influenza B by PCR NEGATIVE NEGATIVE Final    Comment: (NOTE) The Xpert Xpress SARS-CoV-2/FLU/RSV plus assay is intended as an aid in the diagnosis of influenza from  Nasopharyngeal swab specimens and should not be used as a sole basis for treatment. Nasal washings and aspirates are unacceptable for Xpert Xpress SARS-CoV-2/FLU/RSV testing.  Fact Sheet for Patients: BloggerCourse.com  Fact Sheet for Healthcare Providers: SeriousBroker.it  This test is not yet approved or cleared by the [place] FDA and has been authorized for detection and/or diagnosis of SARS-CoV-2 by FDA under an Emergency Use Authorization (EUA). This EUA will remain in effect (meaning this test can be used) for the duration of the COVID-19 declaration under Section 564(b)(1) of the Act, 21 U.S.C. section 360bbb-3(b)(1), unless the authorization is terminated or revoked.     Resp Syncytial Virus by PCR NEGATIVE NEGATIVE Final    Comment: (NOTE) Fact Sheet for Patients: BloggerCourse.com  Fact Sheet for Healthcare Providers: SeriousBroker.it  This test is not yet approved or cleared by the [place] FDA and has been authorized for detection and/or diagnosis of SARS-CoV-2 by FDA under an Emergency Use Authorization (EUA). This EUA will remain in effect (meaning this test can be used) for the duration of the COVID-19 declaration under Section 564(b)(1) of the Act, 21 U.S.C.  section 360bbb-3(b)(1), unless the authorization is terminated or revoked.  Performed at [facility], [address] [place], [place] [address]   MRSA Next Gen by PCR, Nasal     Status: None   Collection Time: 09/12/23  6:01 AM   Specimen: Nasal Mucosa; Nasal Swab  Result Value Ref Range Status   MRSA by PCR Next Gen NOT DETECTED NOT DETECTED Final    Comment: (NOTE) The GeneXpert MRSA Assay (FDA approved for NASAL specimens only), is one component of a comprehensive MRSA colonization surveillance program. It is not intended to diagnose MRSA infection nor to guide or  monitor treatment for MRSA infections. Test performance is not FDA approved in patients less than 23 years old. Performed at [facility], [address], [place], [place] [address]   Blood culture (routine x 2)     Status: None (Preliminary result)   Collection Time: 09/12/23  6:23 AM   Specimen: BLOOD  Result Value Ref Range Status   Specimen Description   Final    BLOOD BLOOD RIGHT HAND Performed at [facility], [address], [place], [place] [address]    Special Requests   Final    BOTTLES DRAWN AEROBIC AND ANAEROBIC Blood Culture results may not be optimal due to an inadequate volume of blood received in culture bottles Performed at [facility], [address] [place], [place] [address]    Culture   Final    NO GROWTH 2 DAYS Performed at [facility], [address], [place], [place] [address]    Report Status PENDING  Incomplete  Blood culture (routine x 2)     Status: None (Preliminary result)   Collection Time: 09/12/23  6:27 AM   Specimen: BLOOD LEFT FOREARM  Result Value Ref Range Status   Specimen Description   Final    BLOOD LEFT FOREARM Performed at [facility], [address], [place], [place] [address]    Special Requests   Final    BOTTLES DRAWN AEROBIC AND ANAEROBIC Blood Culture results may not be optimal due to an inadequate volume of blood received in culture bottles Performed at [facility], [address] [place], [place] [address]    Culture  Setup Time   Final    GRAM POSITIVE COCCI ANAEROBIC BOTTLE ONLY CRITICAL RESULT CALLED TO, READ BACK BY AND VERIFIED WITH: L POINDEXTER,[email or web address]  09/13/23 MK    Culture   Final    GRAM POSITIVE COCCI IDENTIFICATION TO FOLLOW Performed at [facility], [address] [place], [place] [address]    Report Status PENDING  Incomplete  Blood Culture ID Panel (Reflexed)     Status: Abnormal   Collection Time:  09/12/23  6:27 AM  Result Value Ref Range Status   Enterococcus faecalis NOT DETECTED NOT DETECTED Final   Enterococcus Faecium NOT DETECTED NOT DETECTED Final   Listeria monocytogenes NOT DETECTED NOT DETECTED Final   Staphylococcus species DETECTED (A) NOT DETECTED Final    Comment: CRITICAL RESULT CALLED TO, READ BACK BY AND VERIFIED WITH: L POINDEXTER,[email or web address]  09/13/23 MK    Staphylococcus aureus (BCID) NOT DETECTED NOT DETECTED Final   Staphylococcus epidermidis NOT DETECTED NOT DETECTED Final   Staphylococcus lugdunensis NOT DETECTED NOT DETECTED Final   Streptococcus species NOT DETECTED NOT DETECTED Final   Streptococcus agalactiae NOT DETECTED NOT DETECTED Final   Streptococcus pneumoniae NOT DETECTED NOT DETECTED Final   Streptococcus pyogenes NOT  DETECTED NOT DETECTED Final   A.calcoaceticus-baumannii NOT DETECTED NOT DETECTED Final   Bacteroides fragilis NOT DETECTED NOT DETECTED Final   Enterobacterales NOT DETECTED NOT DETECTED Final   Enterobacter cloacae complex NOT DETECTED NOT DETECTED Final   Escherichia coli NOT DETECTED NOT DETECTED Final   Klebsiella aerogenes NOT DETECTED NOT DETECTED Final   Klebsiella oxytoca NOT DETECTED NOT DETECTED Final   Klebsiella pneumoniae NOT DETECTED NOT DETECTED Final   Proteus species NOT DETECTED NOT DETECTED Final   Salmonella species NOT DETECTED NOT DETECTED Final   Serratia marcescens NOT DETECTED NOT DETECTED Final   Haemophilus influenzae NOT DETECTED NOT DETECTED Final   Neisseria meningitidis NOT DETECTED NOT DETECTED Final   Pseudomonas aeruginosa NOT DETECTED NOT DETECTED Final   Stenotrophomonas maltophilia NOT DETECTED NOT DETECTED Final   Candida albicans NOT DETECTED NOT DETECTED Final   Candida auris NOT DETECTED NOT DETECTED Final   Candida glabrata NOT DETECTED NOT DETECTED Final   Candida krusei NOT DETECTED NOT DETECTED Final   Candida parapsilosis NOT DETECTED NOT DETECTED Final   Candida tropicalis NOT  DETECTED NOT DETECTED Final   Cryptococcus neoformans/gattii NOT DETECTED NOT DETECTED Final    Comment: Performed at [facility], [address] [place], [place] [address]         Radiology Studies: No results found.       Scheduled Meds:  atorvastatin  10 mg Oral q AM   azithromycin  500 mg Oral Daily   bisoprolol  2.5 mg Oral Daily   clopidogrel  75 mg Oral Daily   digoxin  125 mcg Oral Daily   enoxaparin (LOVENOX) injection  40 mg Subcutaneous Q24H   feeding supplement  237 mL Oral BID BM   guaiFENesin  600 mg Oral BID   insulin aspart  0-15 Units Subcutaneous TID WC   insulin aspart  0-5 Units Subcutaneous QHS   midodrine  5 mg Oral TID WC   spironolactone  25 mg Oral Daily   Continuous Infusions:  ceFEPime (MAXIPIME) IV 2 g (09/14/23 8469)     LOS: 2 days    Time spent: 52 minutes spent on 09/14/2023 caring for this patient face-to-face including chart review, ordering labs/tests, documenting, discussion with nursing staff, consultants, updating family and interview/physical exam    Alvira Philips [place], DO [facility] Available via Epic secure chat 7am-7pm After these hours, please refer to coverage provider listed on [email or web address] 09/14/2023, 12:15 PM

## 2023-09-15 DIAGNOSIS — J189 Pneumonia, unspecified organism: Secondary | ICD-10-CM | POA: Diagnosis not present

## 2023-09-15 LAB — GLUCOSE, CAPILLARY
Glucose-Capillary: 93 mg/dL (ref 70–99)
Glucose-Capillary: 172 mg/dL — ABNORMAL HIGH (ref 70–99)
Glucose-Capillary: 92 mg/dL (ref 70–99)
Glucose-Capillary: 124 mg/dL — ABNORMAL HIGH (ref 70–99)

## 2023-09-15 MED ORDER — BENZONATATE 100 MG PO CAPS
200.0000 mg | ORAL_CAPSULE | Freq: Three times a day (TID) | ORAL | Status: DC | PRN
Start: 2023-09-15 — End: 2023-09-17
  Administered 2023-09-15 – 2023-09-16 (×2): 200 mg via ORAL
  Filled 2023-09-15 (×2): qty 2

## 2023-09-15 MED ORDER — POLYETHYLENE GLYCOL 3350 17 G PO PACK
17.0000 g | PACK | Freq: Every day | ORAL | Status: DC | PRN
  Administered 2023-09-15: 17 g via ORAL
  Filled 2023-09-15: qty 1

## 2023-09-15 MED ORDER — DOCUSATE SODIUM 100 MG PO CAPS
100.0000 mg | ORAL_CAPSULE | Freq: Two times a day (BID) | ORAL | Status: DC
  Administered 2023-09-15 – 2023-09-17 (×5): 100 mg via ORAL
  Filled 2023-09-15 (×5): qty 1

## 2023-09-15 NOTE — Progress Notes (Signed)
 PROGRESS NOTE    Lynn Estrada  AVW:[id number] DOB: [birth year] DOA: 09/12/2023 PCP: Porfirio Oar, PA    Brief Narrative:   Lynn Estrada is a 63 y.o. 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  Patient endorses productive cough with report of blood tinged sputum.  With nausea and vomiting.  Recently hospitalized 2/21 through 2/25 for UTI and community-acquired pneumonia, also with concern for decompensated CHF and was diuresed with Lasix.  [name] was held following hospitalization but resumed in the outpatient setting after seeing cardiology outpatient.  Has not been on Lasix since her hospitalization, unclear why this is.  In the ED, temperature 100.4 F, HR 109, RR 26, BP 134/95, SpO2 83% on room air and placed on 3 L nasal cannula.  WBC 10.1, hemoglobin 9.0, platelet count 228.  Sodium 139, potassium 4.0, chloride 1 3, CO2 23, glucose 138, BUN 14, creatinine 0.95.  AST 19, ALT 24, total bilirubin 1.2.  High sensitive troponin 35>39.  BNP 1151.1.  Lactic acid 1.0.  COVID/influenza/RSV PCR negative.  Digoxin level 0.4.  Chest x-ray with bilateral airspace disease similar to last month, focally dense appearance favoring pneumonia over alveolar edema, chronic cardiomegaly.  CT angiogram chest negative for pulmonary embolism, noted multilobar pneumonia, marked cardiomegaly.  Patient was started on empiric antibiotics.  Cardiology was consulted for concern of component of CHF causing hypoxia.  TRH consulted for admission.  Assessment & Plan:   Acute hypoxic respiratory failure, POA Community-acquired pneumonia Patient presenting with progressive shortness of breath, cough, fatigue, body aches.  Recently hospitalized for similar and treated with antibiotics last month.   Patient with fever up to 100.4 in the ED, tachycardic, tachypneic and hypoxic with SpO2 83% on room air.  Imaging consistent with multilobar pneumonia.  MRSA PCR negative. -- WBC 10.1>7.5 -- Afebrile past 24 hours -- Cefepime 2 g IV q8h -- Mucinex 600mg  PO q12h -- Tussionex every 12 hours as needed cough -- Albuterol neb every 2 hours.  Wheezing/shortness of breath -- Oxygen now weaned off -- CBC in the a.m.  Positive blood culture 1 out of 4 blood cultures positive for staph species per BCID, suspect Staphylococcus epidermidis contaminant. -- Continue to monitor blood cultures and await further identification  Combined chronic systolic/diastolic congestive heart failure Hx essential hypertension Hx BiV PPM Patient reports productive cough with blood-tinged sputum; which would be more consistent with pulmonary edema.  BNP elevated on admission 1500.  LVEF 20%.  Follows with heart failure service outpatient.  Recently hospitalized and diuresis with Lasix but did not continue on discharge.  Recently restarted [facility] outpatient. -- Cardiology following peripherally now -- Digoxin 125 mcg p.o. daily -- Bisoprolol 2.5 mg p.o. daily -- Spironolactone 25 mg p.o. daily -- Entresto discontinued due to hypotension; started on midodrine 5 mg p.o. 3 times daily -- Strict I's and O's Daily weights  Type 2 diabetes mellitus Hemoglobin A1c 7.1 on 05/24/2023. On metformin 5 mg p.o. twice daily outpatient.  -- Hold oral hypoglycemics while inpatient -- Moderate SSI for coverage -- CBG before every meal/at bedtime  Hyperlipidemia -- Atorvastatin 10 mg p.o. daily   DVT prophylaxis: enoxaparin (LOVENOX) injection 40 mg Start: 09/12/23 0800    Code Status: Full Code Family Communication: No family present bedside this morning  Disposition Plan:  Level of  care: Progressive Status is: Inpatient Remains inpatient appropriate because: IV antibiotics, anticipate discharge home in 2 days following  completion of antibiotic course    Consultants:  Cardiology  Procedures:  None  Antimicrobials:  Vancomycin 3/13 - 3/13 Cefepime 3/12>>   Subjective: Patient seen examined bedside, lying in bed.  Lying in bed.  Sleeping.  Oxygen now titrated off.  Now afebrile past 24 hours.  Continues with cough but no further production of blood tinged sputum. No other specific questions, concerns or complaints at this time. Denies headache, no visual changes, no chest pain, no palpitations, no abdominal pain, no current fever, no chills/night sweats, no current nausea/vomiting, no diarrhea, no focal weakness, no fatigue, no paresthesia.  No acute events overnight per nursing.  Objective: Vitals:   09/14/23 2000 09/14/23 2125 09/15/23 0403 09/15/23 1205  BP:  103/61 114/74 (!) 98/56  Pulse:  82 95 71  Resp:  20 20   Temp:  99.1 F (37.3 C) 99 F (37.2 C) 98.1 F (36.7 C)  TempSrc:  Oral Oral Oral  SpO2: 100% 98% 94% 98%  Weight:   90.1 kg   Height:        Intake/Output Summary (Last 24 hours) at 09/15/2023 1220 Last data filed at 09/15/2023 0602 Gross per 24 hour  Intake 750 ml  Output 600 ml  Net 150 ml   Filed Weights   09/12/23 0359 09/14/23 0606 09/15/23 0403  Weight: 92 kg 95.3 kg 90.1 kg    Examination:  Physical Exam: GEN: NAD, alert and oriented x 3, chronically ill in appearance HEENT: NCAT, PERRL, EOMI, sclera clear, MMM PULM: Breath sounds diminished bilateral bases with rhonchi, no wheezing, normal respiratory effort with accessory muscle use, on room air at rest with SpO2 94% CV: RRR w/o M/G/R GI: abd soft, NTND, + BS MSK: Trace bilateral lower extremity pedal edema, moves all extremities independently with preserved muscle strength NEURO: No focal neurological deficit PSYCH: normal mood/affect Integumentary: Dry patch of skin RUE, otherwise no other concerning rashes/lesions/wounds noted on exposed skin surfaces    Data Reviewed: I have personally reviewed  following labs and imaging studies  CBC: Recent Labs  Lab 09/12/23 0550 09/13/23 0337 09/14/23 0351  WBC 10.1 7.5 6.1  NEUTROABS 8.8*  --   --   HGB 9.0* 9.5* 8.3*  HCT 29.0* 31.3* 27.7*  MCV 90.6 92.1 93.6  PLT 228 220 180   Basic Metabolic Panel: Recent Labs  Lab 09/12/23 0454 09/13/23 0337 09/14/23 0351  NA 139 136 135  K 4.0 3.7 4.1  CL 103 103 104  CO2 23 24 23   GLUCOSE 138* 114* 113*  BUN 14 16 21   CREATININE 0.95 1.05* 0.87  CALCIUM 9.2 8.5* 8.3*   GFR: Estimated Creatinine Clearance: 80.8 mL/min (by C-G formula based on SCr of 0.87 mg/dL). Liver Function Tests: Recent Labs  Lab 09/12/23 0454  AST 19  ALT 24  ALKPHOS 51  BILITOT 1.2  PROT 7.4  ALBUMIN 3.9   No results for input(s): "LIPASE", "AMYLASE" in the last 168 hours. No results for input(s): "AMMONIA" in the last 168 hours. Coagulation Profile: No results for input(s): "INR", "PROTIME" in the last 168 hours. Cardiac Enzymes: No results for input(s): "CKTOTAL", "CKMB", "CKMBINDEX", "TROPONINI" in the last 168 hours. BNP (last 3 results) No results for input(s): "PROBNP" in the last 8760 hours. HbA1C: No results for input(s): "HGBA1C" in the last 72 hours. CBG: Recent Labs  Lab 09/14/23 1206 09/14/23 1723 09/14/23 2136  09/15/23 0750 09/15/23 1138  GLUCAP 210* 103* 102* 93 172*   Lipid Profile: No results for input(s): "CHOL", "HDL", "LDLCALC", "TRIG", "CHOLHDL", "LDLDIRECT" in the last 72 hours. Thyroid Function Tests: No results for input(s): "TSH", "T4TOTAL", "FREET4", "T3FREE", "THYROIDAB" in the last 72 hours. Anemia Panel: No results for input(s): "VITAMINB12", "FOLATE", "FERRITIN", "TIBC", "IRON", "RETICCTPCT" in the last 72 hours. Sepsis Labs: Recent Labs  Lab 09/12/23 0553  LATICACIDVEN 1.0    Recent Results (from the past 240 hours)  Resp panel by RT-PCR (RSV, Flu A&B, Covid) Anterior Nasal Swab     Status: None   Collection Time: 09/12/23  4:54 AM   Specimen:  Anterior Nasal Swab  Result Value Ref Range Status   SARS Coronavirus 2 by RT PCR NEGATIVE NEGATIVE Final    Comment: (NOTE) SARS-CoV-2 target nucleic acids are NOT DETECTED.  The SARS-CoV-2 RNA is generally detectable in upper respiratory specimens during the acute phase of infection. The lowest concentration of SARS-CoV-2 viral copies this assay can detect is 138 copies/mL. A negative result does not preclude SARS-Cov-2 infection and should not be used as the sole basis for treatment or other patient management decisions. A negative result may occur with  improper specimen collection/handling, submission of specimen other than nasopharyngeal swab, presence of viral mutation(s) within the areas targeted by this assay, and inadequate number of viral copies(<138 copies/mL). A negative result must be combined with clinical observations, patient history, and epidemiological information. The expected result is Negative.  Fact Sheet for Patients:  BloggerCourse.com  Fact Sheet for Healthcare Providers:  SeriousBroker.it  This test is no t yet approved or cleared by the [place] FDA and  has been authorized for detection and/or diagnosis of SARS-CoV-2 by FDA under an Emergency Use Authorization (EUA). This EUA will remain  in effect (meaning this test can be used) for the duration of the COVID-19 declaration under Section 564(b)(1) of the Act, 21 U.S.C.section 360bbb-3(b)(1), unless the authorization is terminated  or revoked sooner.       Influenza A by PCR NEGATIVE NEGATIVE Final   Influenza B by PCR NEGATIVE NEGATIVE Final    Comment: (NOTE) The Xpert Xpress SARS-CoV-2/FLU/RSV plus assay is intended as an aid in the diagnosis of influenza from Nasopharyngeal swab specimens and should not be used as a sole basis for treatment. Nasal washings and aspirates are unacceptable for Xpert Xpress SARS-CoV-2/FLU/RSV testing.  Fact  Sheet for Patients: BloggerCourse.com  Fact Sheet for Healthcare Providers: SeriousBroker.it  This test is not yet approved or cleared by the [place] FDA and has been authorized for detection and/or diagnosis of SARS-CoV-2 by FDA under an Emergency Use Authorization (EUA). This EUA will remain in effect (meaning this test can be used) for the duration of the COVID-19 declaration under Section 564(b)(1) of the Act, 21 U.S.C. section 360bbb-3(b)(1), unless the authorization is terminated or revoked.     Resp Syncytial Virus by PCR NEGATIVE NEGATIVE Final    Comment: (NOTE) Fact Sheet for Patients: BloggerCourse.com  Fact Sheet for Healthcare Providers: SeriousBroker.it  This test is not yet approved or cleared by the [place] FDA and has been authorized for detection and/or diagnosis of SARS-CoV-2 by FDA under an Emergency Use Authorization (EUA). This EUA will remain in effect (meaning this test can be used) for the duration of the COVID-19 declaration under Section 564(b)(1) of the Act, 21 U.S.C. section 360bbb-3(b)(1), unless the authorization is terminated or revoked.  Performed at [facility], [address], [place], [place] [address]   MRSA Next Gen by PCR, Nasal     Status: None   Collection Time: 09/12/23  6:01 AM   Specimen: Nasal Mucosa; Nasal Swab  Result Value Ref Range Status   MRSA by PCR Next Gen NOT DETECTED NOT DETECTED Final    Comment: (NOTE) The GeneXpert MRSA Assay (FDA approved for NASAL specimens only), is one component of a comprehensive MRSA colonization surveillance program. It is not intended to diagnose MRSA infection nor to guide or monitor treatment for MRSA infections. Test performance is not FDA approved in patients less than 59 years old. Performed at [facility], [address] [place], [place] [address]   Blood culture (routine x 2)     Status: None (Preliminary result)   Collection Time: 09/12/23  6:23 AM   Specimen: BLOOD  Result Value Ref Range Status   Specimen Description   Final    BLOOD BLOOD RIGHT HAND Performed at [facility], [address], [place], [place] [address]    Special Requests   Final    BOTTLES DRAWN AEROBIC AND ANAEROBIC Blood Culture results may not be optimal due to an inadequate volume of blood received in culture bottles Performed at [facility], [address], [place], [place] [address]    Culture   Final    NO GROWTH 3 DAYS Performed at [facility], [address], [place], [place] [address]    Report Status PENDING  Incomplete  Blood culture (routine x 2)     Status: Abnormal   Collection Time: 09/12/23  6:27 AM   Specimen: BLOOD LEFT FOREARM  Result Value Ref Range Status   Specimen Description   Final    BLOOD LEFT FOREARM Performed at [facility], [address], [place], [place] [address]    Special Requests   Final    BOTTLES DRAWN AEROBIC AND ANAEROBIC Blood Culture results may not be optimal due to an inadequate volume of blood received in culture bottles Performed at [facility], [address], [place], [place] [address]    Culture  Setup Time   Final    GRAM POSITIVE COCCI ANAEROBIC BOTTLE ONLY CRITICAL RESULT CALLED TO, READ BACK BY AND VERIFIED WITH: L POINDEXTER,PHARMD@[id number]  09/13/23 MK    Culture (A)  Final    STAPHYLOCOCCUS HOMINIS THE SIGNIFICANCE OF ISOLATING THIS ORGANISM FROM A SINGLE SET OF BLOOD CULTURES WHEN MULTIPLE SETS ARE DRAWN IS UNCERTAIN. PLEASE NOTIFY THE MICROBIOLOGY DEPARTMENT WITHIN ONE WEEK IF SPECIATION AND SENSITIVITIES ARE REQUIRED. Performed at [facility], [address], [place], [place] [address]    Report Status 09/14/2023 FINAL  Final  Blood Culture ID Panel (Reflexed)     Status: Abnormal    Collection Time: 09/12/23  6:27 AM  Result Value Ref Range Status   Enterococcus faecalis NOT DETECTED NOT DETECTED Final   Enterococcus Faecium NOT DETECTED NOT DETECTED Final   Listeria monocytogenes NOT DETECTED NOT DETECTED Final   Staphylococcus species DETECTED (A) NOT DETECTED Final    Comment: CRITICAL RESULT CALLED TO, READ BACK BY AND VERIFIED WITH: L POINDEXTER,[email or web address]@0624  09/13/23 MK    Staphylococcus aureus (BCID) NOT DETECTED NOT DETECTED Final   Staphylococcus epidermidis NOT DETECTED NOT DETECTED Final   Staphylococcus lugdunensis NOT DETECTED NOT DETECTED Final   Streptococcus species NOT DETECTED NOT DETECTED Final   Streptococcus agalactiae NOT DETECTED NOT DETECTED Final   Streptococcus  pneumoniae NOT DETECTED NOT DETECTED Final   Streptococcus pyogenes NOT DETECTED NOT DETECTED Final   A.calcoaceticus-baumannii NOT DETECTED NOT DETECTED Final   Bacteroides fragilis NOT DETECTED NOT DETECTED Final   Enterobacterales NOT DETECTED NOT DETECTED Final   Enterobacter cloacae complex NOT DETECTED NOT DETECTED Final   Escherichia coli NOT DETECTED NOT DETECTED Final   Klebsiella aerogenes NOT DETECTED NOT DETECTED Final   Klebsiella oxytoca NOT DETECTED NOT DETECTED Final   Klebsiella pneumoniae NOT DETECTED NOT DETECTED Final   Proteus species NOT DETECTED NOT DETECTED Final   Salmonella species NOT DETECTED NOT DETECTED Final   Serratia marcescens NOT DETECTED NOT DETECTED Final   Haemophilus influenzae NOT DETECTED NOT DETECTED Final   Neisseria meningitidis NOT DETECTED NOT DETECTED Final   Pseudomonas aeruginosa NOT DETECTED NOT DETECTED Final   Stenotrophomonas maltophilia NOT DETECTED NOT DETECTED Final   Candida albicans NOT DETECTED NOT DETECTED Final   Candida auris NOT DETECTED NOT DETECTED Final   Candida glabrata NOT DETECTED NOT DETECTED Final   Candida krusei NOT DETECTED NOT DETECTED Final   Candida parapsilosis NOT DETECTED NOT DETECTED Final    Candida tropicalis NOT DETECTED NOT DETECTED Final   Cryptococcus neoformans/gattii NOT DETECTED NOT DETECTED Final    Comment: Performed at [facility], [address] [place], [place] [address]         Radiology Studies: No results found.       Scheduled Meds:  atorvastatin  10 mg Oral q AM   azithromycin  500 mg Oral Daily   bisoprolol  2.5 mg Oral Daily   clopidogrel  75 mg Oral Daily   digoxin  125 mcg Oral Daily   docusate sodium  100 mg Oral BID   enoxaparin (LOVENOX) injection  40 mg Subcutaneous Q24H   feeding supplement  237 mL Oral BID BM   guaiFENesin  600 mg Oral BID   insulin aspart  0-15 Units Subcutaneous TID WC   insulin aspart  0-5 Units Subcutaneous QHS   midodrine  5 mg Oral TID WC   spironolactone  25 mg Oral Daily   Continuous Infusions:  ceFEPime (MAXIPIME) IV 2 g (09/15/23 0602)     LOS: 3 days    Time spent: 52 minutes spent on 09/15/2023 caring for this patient face-to-face including chart review, ordering labs/tests, documenting, discussion with nursing staff, consultants, updating family and interview/physical exam    Alvira Philips [place], DO [facility] Available via Epic secure chat 7am-7pm After these hours, please refer to coverage provider listed on [email or web address] 09/15/2023, 12:20 PM

## 2023-09-15 NOTE — Evaluation (Signed)
 Occupational Therapy Evaluation Patient Details Name: Lynn Estrada MRN: [id number] DOB: 05/23/1961 Today's Date: 09/15/2023   History of Present Illness   63 y.o. female admitted to the hospital on 09/12/23 with acute hypoxic respiratory failure and healthcare acquired multifocal pneumonia.  Past medical history significant for GERD, diabetes, COPD on room air, combined systolic and congestive heart failure with EF 20%, BiV paced, hyperlipidemia, hypertension, CVA on Plavix and recent hospitalization for community-acquired pneumonia and UTI     Clinical Impressions Patient evaluated by Occupational Therapy with no further acute OT needs identified. All education has been completed and the patient has no further questions. Patient is MI for ADLs at RW level at this time. Mobility specialist referral placed on this date.  See below for any follow-up Occupational Therapy or equipment needs. OT is signing off. Thank you for this referral.      If plan is discharge home, recommend the following:   Assistance with cooking/housework;Assist for transportation;Help with stairs or ramp for entrance     Functional Status Assessment   Patient has not had a recent decline in their functional status     Equipment Recommendations   None recommended by OT      Precautions/Restrictions   Precautions Precautions: Fall Precaution/Restrictions Comments: monitor sats Restrictions Weight Bearing Restrictions Per Provider Order: No     Mobility Bed Mobility Overal bed mobility: Modified Independent                          Balance Overall balance assessment: No apparent balance deficits (not formally assessed)             ADL either performed or assessed with clinical judgement   ADL Overall ADL's : Modified independent         General ADL Comments: patient is MI at RW level for ADLs at this time. patient endorsed feeling weaker still at this time but that  she did not need help with ADLs. mobility consult was placed at this time for patient. patient is able to complete LB Dressing, toileting tasks, bed mobility and transfers with MI at RW level at this time. no OT needs identified     Vision Baseline Vision/History: 1 Wears glasses              Pertinent Vitals/Pain Pain Assessment Pain Assessment: No/denies pain     Extremity/Trunk Assessment Upper Extremity Assessment Upper Extremity Assessment: RUE deficits/detail;Right hand dominant RUE Deficits / Details: reported shoulders are at baseline RUE Coordination: decreased gross motor   Lower Extremity Assessment Lower Extremity Assessment: Defer to PT evaluation   Cervical / Trunk Assessment Cervical / Trunk Assessment: Normal      Cognition Arousal: Alert Behavior During Therapy: WFL for tasks assessed/performed, Flat affect Cognition: No apparent impairments                              Home Living Family/patient expects to be discharged to:: Private residence Living Arrangements: Spouse/significant other;Children Available Help at Discharge: Family;Available 24 hours/day Type of Home: House Home Access: Stairs to enter [employer] of Steps: 1 small Entrance Stairs-Rails: None Home Layout: One level         Bathroom Toilet: Handicapped height Bathroom Accessibility: Yes How Accessible: Accessible via walker;Accessible via wheelchair Home Equipment: Rolling Walker (2 wheels);Cane - single point;Crutches          Prior Functioning/Environment Prior Level of  Function : Independent/Modified Independent;Working/employed                    OT Problem List: Decreased activity tolerance   OT Treatment/Interventions:        OT Goals(Current goals can be found in the care plan section)   Acute Rehab OT Goals Patient Stated Goal: to get energy back   OT Frequency:          AM-PAC OT "6 Clicks" Daily Activity     Outcome  Measure Help from another person eating meals?: None Help from another person taking care of personal grooming?: None Help from another person toileting, which includes using toliet, bedpan, or urinal?: None Help from another person bathing (including washing, rinsing, drying)?: None Help from another person to put on and taking off regular upper body clothing?: None Help from another person to put on and taking off regular lower body clothing?: None 6 Click Score: 24   End of Session Equipment Utilized During Treatment: Rolling walker (2 wheels) Nurse Communication: Mobility status  Activity Tolerance: Patient tolerated treatment well Patient left: in chair;with call bell/phone within reach;with nursing/sitter in room  OT Visit Diagnosis: Muscle weakness (generalized) (M62.81);Pain                Time: 8295-6213 OT Time Calculation (min): 17 min Charges:  OT General Charges $OT Visit: 1 Visit OT Evaluation $OT Eval Low Complexity: 1 Low  Matilde Markie OTR/L, MS Acute Rehabilitation Department Office# [phone]   Selinda Flavin 09/15/2023, 12:11 PM

## 2023-09-15 NOTE — Progress Notes (Signed)
 Mobility Specialist - Progress Note  (RA)  Pre-mobility: 77 bpm HR, 99% SPO2 Post-mobility: 89 bpm HR, 99% SPO2    09/15/23 1217  Mobility  Activity Ambulated with assistance in hallway  Level of Assistance Standby assist, set-up cues, supervision of patient - no hands on  Assistive Device Front wheel walker  Distance Ambulated (ft) 150 ft  Range of Motion/Exercises Active  Activity Response Tolerated well  Mobility Referral Yes  Mobility visit 1 Mobility  Mobility Specialist Start Time (ACUTE ONLY) 1140  Mobility Specialist Stop Time (ACUTE ONLY) 1155  Mobility Specialist Time Calculation (min) (ACUTE ONLY) 15 min   Pt was found on recliner chair and agreeable to ambulate. Stated feeling off balance during session. At EOS returned to bed with all needs met. Call bell in reach.  Billey Chang Mobility Specialist

## 2023-09-16 DIAGNOSIS — J189 Pneumonia, unspecified organism: Secondary | ICD-10-CM | POA: Diagnosis not present

## 2023-09-16 LAB — GLUCOSE, CAPILLARY
Glucose-Capillary: 105 mg/dL — ABNORMAL HIGH (ref 70–99)
Glucose-Capillary: 156 mg/dL — ABNORMAL HIGH (ref 70–99)
Glucose-Capillary: 104 mg/dL — ABNORMAL HIGH (ref 70–99)
Glucose-Capillary: 109 mg/dL — ABNORMAL HIGH (ref 70–99)

## 2023-09-16 NOTE — TOC Progression Note (Signed)
 Transition of Care [facility]) - Progression Note   Patient Details  Name: Lynn Estrada MRN: [id number] Date of Birth: 1961/01/02  Transition of [facility]) CM/SW Contact  Ewing Schlein, LCSW Phone Number: 09/16/2023, 1:45 PM  Clinical Narrative: Patient requested to speak with CSW regarding OPPT. Patient expressed concerns about getting another appointment at [facility] for OPPT. Patient agreeable to CSW sending a new OPPT referral. OPPT referral completed.  Expected Discharge Plan: OP Rehab Barriers to Discharge: Continued Medical Work up  Expected Discharge Plan and Services In-house Referral: Clinical Social Work Living arrangements for the past 2 months: Single Family Home             DME Arranged: N/A DME Agency: NA  Social Determinants of Health (SDOH) Interventions SDOH Screenings   Food Insecurity: No Food Insecurity (09/12/2023)  Housing: Low Risk  (09/12/2023)  Transportation Needs: No Transportation Needs (09/12/2023)  Utilities: Not At Risk (09/12/2023)  Depression (PHQ2-9): Low Risk  (03/16/2022)  Financial Resource Strain: Low Risk  (08/28/2023)   Received from [facility]  Physical Activity: Insufficiently Active (08/20/2022)   Received from [facility], [facility]  Social Connections: Socially Integrated (08/20/2022)   Received from [facility], [facility]  Stress: No Stress Concern Present (08/20/2022)   Received from [facility], [facility]  Tobacco Use: Medium Risk (09/12/2023)   Readmission Risk Interventions     No data to display

## 2023-09-16 NOTE — Progress Notes (Signed)
 Mobility Specialist - Progress Note   09/16/23 1019  Mobility  Activity Ambulated with assistance in hallway  Level of Assistance Modified independent, requires aide device or extra time  Assistive Device Front wheel walker  Distance Ambulated (ft) 150 ft  Range of Motion/Exercises Active  Activity Response Tolerated well  Mobility Referral Yes  Mobility visit 1 Mobility  Mobility Specialist Start Time (ACUTE ONLY) 1000  Mobility Specialist Stop Time (ACUTE ONLY) 1019  Mobility Specialist Time Calculation (min) (ACUTE ONLY) 19 min   Pt was found in bed and agreeable to ambulate. No complaints with session. At EOS returned to sit EOB with all needs met. Call bell in reach.  Billey Chang Mobility Specialist

## 2023-09-16 NOTE — Progress Notes (Signed)
 PROGRESS NOTE    Lynn Estrada  WUX:[id number] DOB: 1961/07/01 DOA: 09/12/2023 PCP: Porfirio Oar, PA    Brief Narrative:   Lynn Estrada is a 63 y.o. 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  Patient endorses productive cough with report of blood tinged sputum.  With nausea and vomiting.  Recently hospitalized 2/21 through 2/25 for UTI and community-acquired pneumonia, also with concern for decompensated CHF and was diuresed with Lasix.  [name] was held following hospitalization but resumed in the outpatient setting after seeing cardiology outpatient.  Has not been on Lasix since her hospitalization, unclear why this is.  In the ED, temperature 100.4 F, HR 109, RR 26, BP 134/95, SpO2 83% on room air and placed on 3 L nasal cannula.  WBC 10.1, hemoglobin 9.0, platelet count 228.  Sodium 139, potassium 4.0, chloride 1 3, CO2 23, glucose 138, BUN 14, creatinine 0.95.  AST 19, ALT 24, total bilirubin 1.2.  High sensitive troponin 35>39.  BNP 1151.1.  Lactic acid 1.0.  COVID/influenza/RSV PCR negative.  Digoxin level 0.4.  Chest x-ray with bilateral airspace disease similar to last month, focally dense appearance favoring pneumonia over alveolar edema, chronic cardiomegaly.  CT angiogram chest negative for pulmonary embolism, noted multilobar pneumonia, marked cardiomegaly.  Patient was started on empiric antibiotics.  Cardiology was consulted for concern of component of CHF causing hypoxia.  TRH consulted for admission.  Assessment & Plan:   Acute hypoxic respiratory failure, POA Community-acquired pneumonia Patient presenting with progressive shortness of breath, cough, fatigue, body aches.  Recently hospitalized for similar and treated with antibiotics last month.   Patient with fever up to 100.4 in the ED, tachycardic, tachypneic and hypoxic with SpO2 83% on room air.  Imaging consistent with multilobar pneumonia.  MRSA PCR negative. -- WBC 10.1>7.5 -- Afebrile past 24 hours -- Cefepime 2 g IV q8h -- Mucinex 600mg  PO q12h -- Tussionex every 12 hours as needed cough -- Albuterol neb every 2 hours.  Wheezing/shortness of breath -- Oxygen now weaned off -- CBC in the a.m.  Positive blood culture 1 out of 4 blood cultures positive for staph species per BCID, suspect Staphylococcus epidermidis contaminant. -- Continue to monitor blood cultures and await further identification  Combined chronic systolic/diastolic congestive heart failure Hx essential hypertension Hx BiV PPM Patient reports productive cough with blood-tinged sputum; which would be more consistent with pulmonary edema.  BNP elevated on admission 1500.  LVEF 20%.  Follows with heart failure service outpatient.  Recently hospitalized and diuresis with Lasix but did not continue on discharge.  Recently restarted [facility] outpatient. -- Cardiology following peripherally now -- Digoxin 125 mcg p.o. daily -- Bisoprolol 2.5 mg p.o. daily -- Spironolactone 25 mg p.o. daily -- Entresto discontinued due to hypotension; started on midodrine 5 mg p.o. 3 times daily -- Strict I's and O's Daily weights  Type 2 diabetes mellitus Hemoglobin A1c 7.1 on 05/24/2023. On metformin 5 mg p.o. twice daily outpatient.  -- Hold oral hypoglycemics while inpatient -- Moderate SSI for coverage -- CBG before every meal/at bedtime  Hyperlipidemia -- Atorvastatin 10 mg p.o. daily   DVT prophylaxis: enoxaparin (LOVENOX) injection 40 mg Start: 09/12/23 0800    Code Status: Full Code Family Communication: No family present bedside this morning  Disposition Plan:  Level of  care: Progressive Status is: Inpatient Remains inpatient appropriate because: IV antibiotics, anticipate discharge home in 2 days following  completion of antibiotic course    Consultants:  Cardiology  Procedures:  None  Antimicrobials:  Vancomycin 3/13 - 3/13 Cefepime 3/12>>   Subjective: Patient seen examined bedside, lying in bed.  Sleeping; easily arousable.  Remains titrated off of oxygen.  Continues with cough, now nonproductive. No other specific questions, concerns or complaints at this time. Denies headache, no visual changes, no chest pain, no palpitations, no abdominal pain, no current fever, no chills/night sweats, no current nausea/vomiting, no diarrhea, no focal weakness, no fatigue, no paresthesia.  No acute events overnight per nursing.  Plan to complete antibiotics tonight and anticipate discharge home tomorrow.  Objective: Vitals:   09/15/23 1205 09/15/23 2025 09/16/23 0357 09/16/23 0500  BP: (!) 98/56 110/83 118/89   Pulse: 71 80 88   Resp:  17 17   Temp: 98.1 F (36.7 C) 98.9 F (37.2 C) 99.1 F (37.3 C)   TempSrc: Oral Oral Oral   SpO2: 98% 100% 97%   Weight:    92.6 kg  Height:        Intake/Output Summary (Last 24 hours) at 09/16/2023 1114 Last data filed at 09/16/2023 0416 Gross per 24 hour  Intake 350 ml  Output 500 ml  Net -150 ml   Filed Weights   09/14/23 0606 09/15/23 0403 09/16/23 0500  Weight: 95.3 kg 90.1 kg 92.6 kg    Examination:  Physical Exam: GEN: NAD, alert and oriented x 3, chronically ill in appearance HEENT: NCAT, PERRL, EOMI, sclera clear, MMM PULM: Breath sounds diminished bilateral bases with rhonchi, no wheezing, normal respiratory effort with accessory muscle use, on room air at rest with SpO2 97% CV: RRR w/o M/G/R GI: abd soft, NTND, + BS MSK: Trace bilateral lower extremity pedal edema, moves all extremities independently with preserved muscle strength NEURO: No focal neurological deficit PSYCH: normal mood/affect Integumentary: Dry patch of skin RUE, otherwise no other concerning rashes/lesions/wounds noted on exposed skin surfaces    Data Reviewed:  I have personally reviewed following labs and imaging studies  CBC: Recent Labs  Lab 09/12/23 0550 09/13/23 0337 09/14/23 0351  WBC 10.1 7.5 6.1  NEUTROABS 8.8*  --   --   HGB 9.0* 9.5* 8.3*  HCT 29.0* 31.3* 27.7*  MCV 90.6 92.1 93.6  PLT 228 220 180   Basic Metabolic Panel: Recent Labs  Lab 09/12/23 0454 09/13/23 0337 09/14/23 0351  NA 139 136 135  K 4.0 3.7 4.1  CL 103 103 104  CO2 23 24 23   GLUCOSE 138* 114* 113*  BUN 14 16 21   CREATININE 0.95 1.05* 0.87  CALCIUM 9.2 8.5* 8.3*   GFR: Estimated Creatinine Clearance: 88.2 mL/min (by C-G formula based on SCr of 0.87 mg/dL). Liver Function Tests: Recent Labs  Lab 09/12/23 0454  AST 19  ALT 24  ALKPHOS 51  BILITOT 1.2  PROT 7.4  ALBUMIN 3.9   No results for input(s): "LIPASE", "AMYLASE" in the last 168 hours. No results for input(s): "AMMONIA" in the last 168 hours. Coagulation Profile: No results for input(s): "INR", "PROTIME" in the last 168 hours. Cardiac Enzymes: No results for input(s): "CKTOTAL", "CKMB", "CKMBINDEX", "TROPONINI" in the last 168 hours. BNP (last 3 results) No results for input(s): "PROBNP" in the last 8760 hours. HbA1C: No results for input(s): "HGBA1C" in the last 72 hours. CBG: Recent Labs  Lab 09/15/23 0750 09/15/23 1138 09/15/23 1633 09/15/23 2030  09/16/23 0725  GLUCAP 93 172* 92 124* 105*   Lipid Profile: No results for input(s): "CHOL", "HDL", "LDLCALC", "TRIG", "CHOLHDL", "LDLDIRECT" in the last 72 hours. Thyroid Function Tests: No results for input(s): "TSH", "T4TOTAL", "FREET4", "T3FREE", "THYROIDAB" in the last 72 hours. Anemia Panel: No results for input(s): "VITAMINB12", "FOLATE", "FERRITIN", "TIBC", "IRON", "RETICCTPCT" in the last 72 hours. Sepsis Labs: Recent Labs  Lab 09/12/23 0553  LATICACIDVEN 1.0    Recent Results (from the past 240 hours)  Resp panel by RT-PCR (RSV, Flu A&B, Covid) Anterior Nasal Swab     Status: None   Collection Time: 09/12/23   4:54 AM   Specimen: Anterior Nasal Swab  Result Value Ref Range Status   SARS Coronavirus 2 by RT PCR NEGATIVE NEGATIVE Final    Comment: (NOTE) SARS-CoV-2 target nucleic acids are NOT DETECTED.  The SARS-CoV-2 RNA is generally detectable in upper respiratory specimens during the acute phase of infection. The lowest concentration of SARS-CoV-2 viral copies this assay can detect is 138 copies/mL. A negative result does not preclude SARS-Cov-2 infection and should not be used as the sole basis for treatment or other patient management decisions. A negative result may occur with  improper specimen collection/handling, submission of specimen other than nasopharyngeal swab, presence of viral mutation(s) within the areas targeted by this assay, and inadequate number of viral copies(<138 copies/mL). A negative result must be combined with clinical observations, patient history, and epidemiological information. The expected result is Negative.  Fact Sheet for Patients:  BloggerCourse.com  Fact Sheet for Healthcare Providers:  SeriousBroker.it  This test is no t yet approved or cleared by the [place] FDA and  has been authorized for detection and/or diagnosis of SARS-CoV-2 by FDA under an Emergency Use Authorization (EUA). This EUA will remain  in effect (meaning this test can be used) for the duration of the COVID-19 declaration under Section 564(b)(1) of the Act, 21 U.S.C.section 360bbb-3(b)(1), unless the authorization is terminated  or revoked sooner.       Influenza A by PCR NEGATIVE NEGATIVE Final   Influenza B by PCR NEGATIVE NEGATIVE Final    Comment: (NOTE) The Xpert Xpress SARS-CoV-2/FLU/RSV plus assay is intended as an aid in the diagnosis of influenza from Nasopharyngeal swab specimens and should not be used as a sole basis for treatment. Nasal washings and aspirates are unacceptable for Xpert Xpress  SARS-CoV-2/FLU/RSV testing.  Fact Sheet for Patients: BloggerCourse.com  Fact Sheet for Healthcare Providers: SeriousBroker.it  This test is not yet approved or cleared by the [place] FDA and has been authorized for detection and/or diagnosis of SARS-CoV-2 by FDA under an Emergency Use Authorization (EUA). This EUA will remain in effect (meaning this test can be used) for the duration of the COVID-19 declaration under Section 564(b)(1) of the Act, 21 U.S.C. section 360bbb-3(b)(1), unless the authorization is terminated or revoked.     Resp Syncytial Virus by PCR NEGATIVE NEGATIVE Final    Comment: (NOTE) Fact Sheet for Patients: BloggerCourse.com  Fact Sheet for Healthcare Providers: SeriousBroker.it  This test is not yet approved or cleared by the [place] FDA and has been authorized for detection and/or diagnosis of SARS-CoV-2 by FDA under an Emergency Use Authorization (EUA). This EUA will remain in effect (meaning this test can be used) for the duration of the COVID-19 declaration under Section 564(b)(1) of the Act, 21 U.S.C. section 360bbb-3(b)(1), unless the authorization is terminated or revoked.  Performed at [facility], [address]  [place], [place] [address]   MRSA Next Gen by PCR, Nasal     Status: None   Collection Time: 09/12/23  6:01 AM   Specimen: Nasal Mucosa; Nasal Swab  Result Value Ref Range Status   MRSA by PCR Next Gen NOT DETECTED NOT DETECTED Final    Comment: (NOTE) The GeneXpert MRSA Assay (FDA approved for NASAL specimens only), is one component of a comprehensive MRSA colonization surveillance program. It is not intended to diagnose MRSA infection nor to guide or monitor treatment for MRSA infections. Test performance is not FDA approved in patients less than 12 years old. Performed at [facility], [address], [place], [place] [address]   Blood culture (routine x 2)     Status: None (Preliminary result)   Collection Time: 09/12/23  6:23 AM   Specimen: BLOOD  Result Value Ref Range Status   Specimen Description   Final    BLOOD BLOOD RIGHT HAND Performed at [facility], [address] [place], [place] [address]    Special Requests   Final    BOTTLES DRAWN AEROBIC AND ANAEROBIC Blood Culture results may not be optimal due to an inadequate volume of blood received in culture bottles Performed at [facility], [address], [place], [place] [address]    Culture   Final    NO GROWTH 4 DAYS Performed at [facility], [address] [place], [place] [address]    Report Status PENDING  Incomplete  Blood culture (routine x 2)     Status: Abnormal   Collection Time: 09/12/23  6:27 AM   Specimen: BLOOD LEFT FOREARM  Result Value Ref Range Status   Specimen Description   Final    BLOOD LEFT FOREARM Performed at [facility], [address], [place], [place] [address]    Special Requests   Final    BOTTLES DRAWN AEROBIC AND ANAEROBIC Blood Culture results may not be optimal due to an inadequate volume of blood received in culture bottles Performed at [facility], [address], [place], [place] [address]    Culture  Setup Time   Final    GRAM POSITIVE COCCI ANAEROBIC BOTTLE ONLY CRITICAL RESULT CALLED TO, READ BACK BY AND VERIFIED WITH: L POINDEXTER,PHARMD@[id number]  09/13/23 MK    Culture (A)  Final    STAPHYLOCOCCUS HOMINIS THE SIGNIFICANCE OF ISOLATING THIS ORGANISM FROM A SINGLE SET OF BLOOD CULTURES WHEN MULTIPLE SETS ARE DRAWN IS UNCERTAIN. PLEASE NOTIFY THE MICROBIOLOGY DEPARTMENT WITHIN ONE WEEK IF SPECIATION AND SENSITIVITIES ARE REQUIRED. Performed at [facility], [address], [place], [place] [address]    Report Status 09/14/2023 FINAL  Final  Blood Culture ID Panel (Reflexed)      Status: Abnormal   Collection Time: 09/12/23  6:27 AM  Result Value Ref Range Status   Enterococcus faecalis NOT DETECTED NOT DETECTED Final   Enterococcus Faecium NOT DETECTED NOT DETECTED Final   Listeria monocytogenes NOT DETECTED NOT DETECTED Final   Staphylococcus species DETECTED (A) NOT DETECTED Final    Comment: CRITICAL RESULT CALLED TO, READ BACK BY AND VERIFIED WITH: L POINDEXTER,[email or web address]@[id number]  09/13/23 MK    Staphylococcus aureus (BCID) NOT DETECTED NOT DETECTED Final   Staphylococcus epidermidis NOT DETECTED NOT DETECTED Final   Staphylococcus lugdunensis NOT DETECTED NOT DETECTED Final   Streptococcus species NOT DETECTED NOT DETECTED Final   Streptococcus agalactiae NOT DETECTED NOT DETECTED Final   Streptococcus pneumoniae NOT  DETECTED NOT DETECTED Final   Streptococcus pyogenes NOT DETECTED NOT DETECTED Final   A.calcoaceticus-baumannii NOT DETECTED NOT DETECTED Final   Bacteroides fragilis NOT DETECTED NOT DETECTED Final   Enterobacterales NOT DETECTED NOT DETECTED Final   Enterobacter cloacae complex NOT DETECTED NOT DETECTED Final   Escherichia coli NOT DETECTED NOT DETECTED Final   Klebsiella aerogenes NOT DETECTED NOT DETECTED Final   Klebsiella oxytoca NOT DETECTED NOT DETECTED Final   Klebsiella pneumoniae NOT DETECTED NOT DETECTED Final   Proteus species NOT DETECTED NOT DETECTED Final   Salmonella species NOT DETECTED NOT DETECTED Final   Serratia marcescens NOT DETECTED NOT DETECTED Final   Haemophilus influenzae NOT DETECTED NOT DETECTED Final   Neisseria meningitidis NOT DETECTED NOT DETECTED Final   Pseudomonas aeruginosa NOT DETECTED NOT DETECTED Final   Stenotrophomonas maltophilia NOT DETECTED NOT DETECTED Final   Candida albicans NOT DETECTED NOT DETECTED Final   Candida auris NOT DETECTED NOT DETECTED Final   Candida glabrata NOT DETECTED NOT DETECTED Final   Candida krusei NOT DETECTED NOT DETECTED Final   Candida parapsilosis NOT DETECTED NOT  DETECTED Final   Candida tropicalis NOT DETECTED NOT DETECTED Final   Cryptococcus neoformans/gattii NOT DETECTED NOT DETECTED Final    Comment: Performed at [facility], [address], [place], [place] [address]         Radiology Studies: No results found.       Scheduled Meds:  atorvastatin  10 mg Oral q AM   azithromycin  500 mg Oral Daily   bisoprolol  2.5 mg Oral Daily   clopidogrel  75 mg Oral Daily   digoxin  125 mcg Oral Daily   docusate sodium  100 mg Oral BID   enoxaparin (LOVENOX) injection  40 mg Subcutaneous Q24H   feeding supplement  237 mL Oral BID BM   guaiFENesin  600 mg Oral BID   insulin aspart  0-15 Units Subcutaneous TID WC   insulin aspart  0-5 Units Subcutaneous QHS   midodrine  5 mg Oral TID WC   spironolactone  25 mg Oral Daily   Continuous Infusions:  ceFEPime (MAXIPIME) IV 2 g (09/16/23 0602)     LOS: 4 days    Time spent: 52 minutes spent on 09/16/2023 caring for this patient face-to-face including chart review, ordering labs/tests, documenting, discussion with nursing staff, consultants, updating family and interview/physical exam    Alvira Philips [place], DO [facility] Available via Epic secure chat 7am-7pm After these hours, please refer to coverage provider listed on [email or web address] 09/16/2023, 11:14 AM

## 2023-09-17 DIAGNOSIS — J189 Pneumonia, unspecified organism: Secondary | ICD-10-CM | POA: Diagnosis not present

## 2023-09-17 LAB — CULTURE, BLOOD (ROUTINE X 2): Culture: NO GROWTH

## 2023-09-17 LAB — GLUCOSE, CAPILLARY
Glucose-Capillary: 84 mg/dL (ref 70–99)
Glucose-Capillary: 84 mg/dL (ref 70–99)

## 2023-09-17 MED ORDER — GUAIFENESIN ER 600 MG PO TB12: 600.0000 mg | ORAL_TABLET | Freq: Two times a day (BID) | ORAL | 0 refills | Status: DC

## 2023-09-17 MED ORDER — BENZONATATE 200 MG PO CAPS: 200.0000 mg | ORAL_CAPSULE | Freq: Three times a day (TID) | ORAL | 0 refills | Status: DC | PRN

## 2023-09-17 MED ORDER — HYDROCOD POLI-CHLORPHE POLI ER 10-8 MG/5ML PO SUER: 5.0000 mL | Freq: Two times a day (BID) | ORAL | 0 refills | Status: DC | PRN

## 2023-09-17 MED ORDER — MIDODRINE HCL 5 MG PO TABS: 5.0000 mg | ORAL_TABLET | Freq: Three times a day (TID) | ORAL | 0 refills | Status: AC

## 2023-09-17 MED ORDER — SPIRONOLACTONE 25 MG PO TABS: 25.0000 mg | ORAL_TABLET | Freq: Every day | ORAL | 0 refills | Status: DC

## 2023-09-17 NOTE — Discharge Summary (Signed)
 Physician Discharge Summary  Lynn Estrada ZDG:[id number] DOB: Mar 07, 1961 DOA: 09/12/2023  PCP: Porfirio Oar, PA  Admit date: 09/12/2023 Discharge date: 09/17/2023  Admitted From: Home Disposition: Home  Recommendations for Outpatient Follow-up:  Follow up with PCP in 1-2 weeks For with cardiology, Dr. Gala Romney in 1 week Entresto discontinued due to hypotension and started on midodrine Cardiology added spironolactone to regimen during hospitalization  Home Health: Outpatient physical therapy Equipment/Devices: None  Discharge Condition: Stable CODE STATUS: Full code Diet recommendation: Heart healthy/consistent carbohydrate diet  History of present illness:  Lynn Estrada is a 63 y.o. 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  Patient endorses productive cough with report of blood tinged sputum.  With nausea and vomiting.   Recently hospitalized 2/21 through 2/25 for UTI and community-acquired pneumonia, also with concern for decompensated CHF and was diuresed with Lasix.  [name] was held following hospitalization but resumed in the outpatient setting after seeing cardiology outpatient.  Has not been on Lasix since her hospitalization, unclear why this is.   In the ED, temperature 100.4 F, HR 109, RR 26, BP 134/95, SpO2 83% on room air and placed on 3 L nasal cannula.  WBC 10.1, hemoglobin 9.0, platelet count 228.  Sodium 139, potassium 4.0, chloride 1 3, CO2 23, glucose 138, BUN 14, creatinine 0.95.  AST 19, ALT 24, total bilirubin 1.2.  High sensitive troponin 35>39.  BNP 1151.1.  Lactic acid 1.0.  COVID/influenza/RSV PCR negative.  Digoxin level 0.4.  Chest x-ray with bilateral airspace disease similar to last month, focally dense appearance favoring  pneumonia over alveolar edema, chronic cardiomegaly.  CT angiogram chest negative for pulmonary embolism, noted multilobar pneumonia, marked cardiomegaly.  Patient was started on empiric antibiotics.  Cardiology was consulted for concern of component of CHF causing hypoxia.  TRH consulted for admission.  Hospital course:  Acute hypoxic respiratory failure, POA Community-acquired pneumonia Patient presenting with progressive shortness of breath, cough, fatigue, body aches.  Recently hospitalized for similar and treated with antibiotics last month.  Patient with fever up to 100.4 in the ED, tachycardic, tachypneic and hypoxic with SpO2 83% on room air.  Imaging consistent with multilobar pneumonia.  MRSA PCR negative.  Patient was started on antibiotics with azithromycin and cefepime and completed 5-day course.  Oxygen was titrated off and WBC count improved to 7.5 at time of discharge.  Continue Mucinex, Tussionex, Tessalon Perles as needed for symptomatic relief of cough, congestion.  Outpatient follow-up with PCP 1-2 weeks.   Positive blood culture 1 out of 4 blood cultures positive for staph species per BCID, suspect Staphylococcus epidermidis contaminant.   Combined chronic systolic/diastolic congestive heart failure Hx essential hypertension Hx BiV PPM Patient reports productive cough with blood-tinged sputum; which would be more consistent with pulmonary edema.  BNP elevated on admission 1500.  LVEF 20%.  Follows with heart failure service outpatient.  Recently hospitalized and diuresis with Lasix but did not continue on discharge.  Recently restarted [facility] outpatient.  Cardiology was consulted and followed during hospital course.  [name] was discontinued due to hypotension.  Started on midodrine 5 mg p.o. 3 times daily.  Continued on digoxin, bisoprolol and cardiology added spironolactone 25 mg p.o. daily.  Outpatient follow-up with heart failure clinic.   Type 2 diabetes  mellitus Hemoglobin A1c 7.1 on  05/24/2023. On metformin 500 mg p.o. twice daily outpatient.    Hyperlipidemia Atorvastatin 10 mg p.o. daily  Discharge Diagnoses:  Principal Problem:   HCAP (healthcare-associated pneumonia)    Discharge Instructions  Discharge Instructions     Ambulatory referral to Physical Therapy   Complete by: As directed    Call MD for:  difficulty breathing, headache or visual disturbances   Complete by: As directed    Call MD for:  extreme fatigue   Complete by: As directed    Call MD for:  persistant dizziness or light-headedness   Complete by: As directed    Call MD for:  persistant nausea and vomiting   Complete by: As directed    Call MD for:  severe uncontrolled pain   Complete by: As directed    Call MD for:  temperature >100.4   Complete by: As directed    Diet - low sodium heart healthy   Complete by: As directed    Increase activity slowly   Complete by: As directed       Allergies as of 09/17/2023       Reactions   Latex Rash, Hives   Bactrim [sulfamethoxazole-trimethoprim] Rash   Carvedilol Itching   Metoprolol Itching, Rash        Medication List     STOP taking these medications    sacubitril-valsartan 24-26 MG Commonly known as: Entresto       TAKE these medications    acetaminophen 500 MG tablet Commonly known as: TYLENOL Take 500-1,000 mg by mouth every 6 (six) hours as needed (for pain.).   atorvastatin 10 MG tablet Commonly known as: LIPITOR Take 10 mg by mouth in the morning.   benzonatate 200 MG capsule Commonly known as: TESSALON Take 1 capsule (200 mg total) by mouth 3 (three) times daily as needed for cough (not relieved with tussionex).   bisoprolol 5 MG tablet Commonly known as: ZEBETA Take 0.5 tablets (2.5 mg total) by mouth daily.   chlorpheniramine-HYDROcodone 10-8 MG/5ML Commonly known as: TUSSIONEX Take 5 mLs by mouth every 12 (twelve) hours as needed for cough.   clopidogrel 75 MG  tablet Commonly known as: PLAVIX Take 1 tablet (75 mg total) by mouth daily.   digoxin 0.125 MG tablet Commonly known as: LANOXIN Take 1 tablet (125 mcg total) by mouth daily.   furosemide 20 MG tablet Commonly known as: Lasix Take 1 tablet (20 mg total) by mouth daily.   guaiFENesin 600 MG 12 hr tablet Commonly known as: MUCINEX Take 1 tablet (600 mg total) by mouth 2 (two) times daily for 14 days.   Jardiance 10 MG Tabs tablet Generic drug: empagliflozin Take 10 mg by mouth daily.   metFORMIN 500 MG tablet Commonly known as: GLUCOPHAGE Take 1 tablet (500 mg total) by mouth 2 (two) times daily with a meal.   midodrine 5 MG tablet Commonly known as: PROAMATINE Take 1 tablet (5 mg total) by mouth 3 (three) times daily with meals.   multivitamin with minerals Tabs tablet Take 1 tablet by mouth in the morning. One A Day for Women 50+   spironolactone 25 MG tablet Commonly known as: ALDACTONE Take 1 tablet (25 mg total) by mouth daily. Start taking on: September 18, 2023   TUMS E-X PO Take 1 tablet by mouth daily.        Follow-up Information     Leotis Shames [place], [place]. Schedule an appointment as soon as possible for a visit in 1 week(s).  Specialty: Family Medicine Contact information: [address] [place] [address] [phone]         Bensimhon, Bevelyn Buckles, MD. Schedule an appointment as soon as possible for a visit in 1 week(s).   Specialty: Cardiology Contact information: [address] [place] [address] [phone]                Allergies  Allergen Reactions   Latex Rash and Hives   Bactrim [Sulfamethoxazole-Trimethoprim] Rash   Carvedilol Itching   Metoprolol Itching and Rash    Consultations: Cardiology   Procedures/Studies: CT Angio Chest PE W and/or Wo Contrast Result Date: 09/12/2023 CLINICAL DATA:  Recent admission for cough, fatigue, and body aches. Pulmonary embolism suspected, high  probability. EXAM: CT ANGIOGRAPHY CHEST WITH CONTRAST TECHNIQUE: Multidetector CT imaging of the chest was performed using the standard protocol during bolus administration of intravenous contrast. Multiplanar CT image reconstructions and MIPs were obtained to evaluate the vascular anatomy. RADIATION DOSE REDUCTION: This exam was performed according to the departmental dose-optimization program which includes automated exposure control, adjustment of the mA and/or kV according to patient size and/or use of iterative reconstruction technique. CONTRAST:  75mL OMNIPAQUE IOHEXOL 350 MG/ML SOLN COMPARISON:  Preceding chest radiograph. FINDINGS: Cardiovascular: Satisfactory opacification of the pulmonary arteries to the segmental level. No evidence of pulmonary embolism. Enlarged heart size with biventricular pacer in stable position by report preceding radiograph. No significant pericardial effusion. There is atheromatous calcification of the aorta and left main. Enlarged main pulmonary artery at 3.6 cm. This usually reflects pulmonary hypertension. Mediastinum/Nodes: Mild thickening of mediastinal lymph nodes which is likely reactive in this setting. Lungs/Pleura: Bilateral airspace disease affecting all lobes. There is some interlobular septal thickening and alveolar edema is considered but history and patchy distribution is more suggestive of pneumonia. Upper Abdomen: Atheromatous calcification. Musculoskeletal: Thoracic spine degeneration. Review of the MIP images confirms the above findings. IMPRESSION: 1. Multi lobar pneumonia. 2. No pulmonary embolism. 3. Chronic marked cardiomegaly. Electronically Signed   By: Tiburcio Pea M.D.   On: 09/12/2023 06:24   DG Chest 2 View Result Date: 09/12/2023 CLINICAL DATA:  Shortness of breath and cough EXAM: CHEST - 2 VIEW COMPARISON:  08/23/2023 FINDINGS: Bilateral airspace opacity similar to prior. Cardiopericardial enlargement with stable positioning of biventricular  pacer from the left. No visible effusion or pneumothorax. IMPRESSION: Bilateral airspace disease similar to comparison last month, focally dense appearance favoring pneumonia over alveolar edema. Chronic cardiomegaly. Electronically Signed   By: Tiburcio Pea M.D.   On: 09/12/2023 04:49   ECHOCARDIOGRAM COMPLETE Result Date: 08/25/2023    ECHOCARDIOGRAM REPORT   Patient Name:   ROREY HODGES Jutte Date of Exam: 08/25/2023 Medical Rec #:  [id number]         Height:       73.0 in Accession #:    [id number]        Weight:       200.2 lb Date of Birth:  [birth year]        BSA:          2.153 m Patient Age:    62 years          BP:           95/66 mmHg Patient Gender: F                 HR:           96 bpm. Exam Location:  Inpatient Procedure:  2D Echo, Color Doppler and Cardiac Doppler (Both Spectral and Color            Flow Doppler were utilized during procedure). Indications:    Abnormal ECG R94.31  History:        Patient has prior history of Echocardiogram examinations, most                 recent 05/25/2023. Arrythmias:LBBB; Risk Factors:Hypertension                 and Diabetes.  Sonographer:    Webb Laws Referring Phys: [id number] ELIZABETH G MATHEWS IMPRESSIONS  1. Left ventricular ejection fraction, by estimation, is 20 to 25%. The left ventricle has severely decreased function. The left ventricle demonstrates global hypokinesis. The left ventricular internal cavity size was severely dilated. Left ventricular diastolic parameters are consistent with Grade II diastolic dysfunction (pseudonormalization). Elevated left atrial pressure.  2. Right ventricular systolic function is normal. The right ventricular size is normal. Tricuspid regurgitation signal is inadequate for assessing PA pressure.  3. Left atrial size was moderately dilated.  4. The mitral valve is normal in structure. Trivial mitral valve regurgitation. No evidence of mitral stenosis.  5. The aortic valve is tricuspid. Aortic valve regurgitation  is not visualized. No aortic stenosis is present.  6. The inferior vena cava is normal in size with greater than 50% respiratory variability, suggesting right atrial pressure of 3 mmHg. Comparison(s): Prior images reviewed side by side. The left ventricular function is unchanged. The left ventricular diastolic function is significantly worse. FINDINGS  Left Ventricle: Left ventricular ejection fraction, by estimation, is 20 to 25%. The left ventricle has severely decreased function. The left ventricle demonstrates global hypokinesis. Strain imaging was not performed. The left ventricular internal cavity size was severely dilated. There is no left ventricular hypertrophy. Abnormal (paradoxical) septal motion, consistent with left bundle branch block. Left ventricular diastolic parameters are consistent with Grade II diastolic dysfunction (pseudonormalization). Elevated left atrial pressure. Right Ventricle: The right ventricular size is normal. No increase in right ventricular wall thickness. Right ventricular systolic function is normal. Tricuspid regurgitation signal is inadequate for assessing PA pressure. Left Atrium: Left atrial size was moderately dilated. Right Atrium: Right atrial size was normal in size. Pericardium: Trivial pericardial effusion is present. Mitral Valve: The mitral valve is normal in structure. Trivial mitral valve regurgitation. No evidence of mitral valve stenosis. Tricuspid Valve: The tricuspid valve is normal in structure. Tricuspid valve regurgitation is not demonstrated. Aortic Valve: The aortic valve is tricuspid. Aortic valve regurgitation is not visualized. No aortic stenosis is present. Pulmonic Valve: The pulmonic valve was grossly normal. Pulmonic valve regurgitation is not visualized. No evidence of pulmonic stenosis. Aorta: The aortic root and ascending aorta are structurally normal, with no evidence of dilitation. Venous: The inferior vena cava is normal in size with greater  than 50% respiratory variability, suggesting right atrial pressure of 3 mmHg. IAS/Shunts: No atrial level shunt detected by color flow Doppler. Additional Comments: 3D imaging was not performed.  LEFT VENTRICLE PLAX 2D LVIDd:         7.20 cm   Diastology LVIDs:         6.50 cm   LV e' medial:    5.44 cm/s LV PW:         1.10 cm   LV E/e' medial:  23.0 LV IVS:        0.90 cm   LV e' lateral:   3.26 cm/s LVOT diam:  2.30 cm   LV E/e' lateral: 38.3 LV SV:         97 LV SV Index:   45 LVOT Area:     4.15 cm  RIGHT VENTRICLE             IVC RV Basal diam:  2.90 cm     IVC diam: 2.00 cm RV S prime:     11.90 cm/s TAPSE (M-mode): 2.5 cm LEFT ATRIUM             Index        RIGHT ATRIUM           Index LA diam:        4.90 cm 2.28 cm/m   RA Area:     11.40 cm LA Vol (A2C):   71.3 ml 33.12 ml/m  RA Volume:   25.40 ml  11.80 ml/m LA Vol (A4C):   51.7 ml 24.02 ml/m LA Biplane Vol: 66.6 ml 30.94 ml/m  AORTIC VALVE LVOT Vmax:   146.00 cm/s LVOT Vmean:  89.900 cm/s LVOT VTI:    0.234 m  AORTA Ao Root diam: 3.00 cm Ao Asc diam:  3.50 cm MITRAL VALVE MV Area (PHT): 7.66 cm     SHUNTS MV Decel Time: 99 msec      Systemic VTI:  0.23 m MV E velocity: 125.00 cm/s  Systemic Diam: 2.30 cm MV A velocity: 93.30 cm/s MV E/A ratio:  1.34 Mihai Croitoru MD Electronically signed by Thurmon Fair MD Signature Date/Time: 08/25/2023/1:38:57 PM    Final    DG Chest Portable 1 View Result Date: 08/23/2023 CLINICAL DATA:  Productive cough, fatigue, and headache. EXAM: PORTABLE CHEST 1 VIEW COMPARISON:  Chest radiograph 01/14/2023 and 05/14/2022 FINDINGS: Chest wall cardiac AICD is again seen with leads not significantly changed. Cardiac silhouette and mediastinal contours are unchanged and within normal limits with moderate calcification within the aortic arch. New right infrahilar heterogeneous airspace opacification. Probable similar heterogeneous airspace opacity overlying the left lower lungs/cardiac silhouette. No definite pleural  effusion. No pneumothorax. Mild dextrocurvature of the midthoracic spine. Moderate multilevel degenerative disc and endplate changes. IMPRESSION: New right infrahilar heterogeneous airspace opacification and probable similar heterogeneous airspace opacity overlying the left lower lungs/cardiac silhouette. This is suspicious for at least right lower lung pneumonia and possible multifocal pneumonia. Electronically Signed   By: Neita Garnet M.D.   On: 08/23/2023 15:01     Subjective: Patient seen examined bedside, lying in bed.  Sleeping but easy arousable.  Completed IV antibiotics last night.  Discharging home.  Discussed with patient needs close follow-up with her PCP and cardiology.  Discontinued Entresto and started on midodrine for hypotension.  No other specific questions, concerns or complaints at this time.  Denies headache, no dizziness, no chest pain, no palpitations, no shortness of breath, no abdominal pain, no fever/chills/night sweats, no nausea/vomiting/diarrhea, no focal weakness, no fatigue, no paresthesias.  No acute events overnight per nursing staff.  Discharge Exam: Vitals:   09/17/23 0434 09/17/23 0436  BP: 92/65 114/76  Pulse: 74 70  Resp: 19   Temp: 98.2 F (36.8 C)   SpO2: 97%    Vitals:   09/16/23 1443 09/16/23 2005 09/17/23 0434 09/17/23 0436  BP: 111/80 115/75 92/65 114/76  Pulse: 73 76 74 70  Resp: 20 17 19    Temp: 98.8 F (37.1 C) 98.2 F (36.8 C) 98.2 F (36.8 C)   TempSrc: Oral Oral Oral   SpO2: 99% 99% 97%  Weight:      Height:        Physical Exam: GEN: NAD, alert and oriented x 3, chronically ill appearance HEENT: NCAT, PERRL, EOMI, sclera clear, MMM PULM: CTAB w/o wheezes/crackles, normal respiratory effort, on room air with SpO2 97% at rest CV: RRR w/o M/G/R GI: abd soft, NTND, NABS, no R/G/M MSK: Trace bilateral lower extremity peripheral edema, muscle strength globally intact 5/5 bilateral upper/lower extremities NEURO: CN II-XII intact,  no focal deficits, sensation to light touch intact PSYCH: normal mood/affect Integumentary: dry/intact, no rashes or wounds    The results of significant diagnostics from this hospitalization (including imaging, microbiology, ancillary and laboratory) are listed below for reference.     Microbiology: Recent Results (from the past 240 hours)  Resp panel by RT-PCR (RSV, Flu A&B, Covid) Anterior Nasal Swab     Status: None   Collection Time: 09/12/23  4:54 AM   Specimen: Anterior Nasal Swab  Result Value Ref Range Status   SARS Coronavirus 2 by RT PCR NEGATIVE NEGATIVE Final    Comment: (NOTE) SARS-CoV-2 target nucleic acids are NOT DETECTED.  The SARS-CoV-2 RNA is generally detectable in upper respiratory specimens during the acute phase of infection. The lowest concentration of SARS-CoV-2 viral copies this assay can detect is 138 copies/mL. A negative result does not preclude SARS-Cov-2 infection and should not be used as the sole basis for treatment or other patient management decisions. A negative result may occur with  improper specimen collection/handling, submission of specimen other than nasopharyngeal swab, presence of viral mutation(s) within the areas targeted by this assay, and inadequate number of viral copies(<138 copies/mL). A negative result must be combined with clinical observations, patient history, and epidemiological information. The expected result is Negative.  Fact Sheet for Patients:  BloggerCourse.com  Fact Sheet for Healthcare Providers:  SeriousBroker.it  This test is no t yet approved or cleared by the [place] FDA and  has been authorized for detection and/or diagnosis of SARS-CoV-2 by FDA under an Emergency Use Authorization (EUA). This EUA will remain  in effect (meaning this test can be used) for the duration of the COVID-19 declaration under Section 564(b)(1) of the Act, 21 U.S.C.section  360bbb-3(b)(1), unless the authorization is terminated  or revoked sooner.       Influenza A by PCR NEGATIVE NEGATIVE Final   Influenza B by PCR NEGATIVE NEGATIVE Final    Comment: (NOTE) The Xpert Xpress SARS-CoV-2/FLU/RSV plus assay is intended as an aid in the diagnosis of influenza from Nasopharyngeal swab specimens and should not be used as a sole basis for treatment. Nasal washings and aspirates are unacceptable for Xpert Xpress SARS-CoV-2/FLU/RSV testing.  Fact Sheet for Patients: BloggerCourse.com  Fact Sheet for Healthcare Providers: SeriousBroker.it  This test is not yet approved or cleared by the [place] FDA and has been authorized for detection and/or diagnosis of SARS-CoV-2 by FDA under an Emergency Use Authorization (EUA). This EUA will remain in effect (meaning this test can be used) for the duration of the COVID-19 declaration under Section 564(b)(1) of the Act, 21 U.S.C. section 360bbb-3(b)(1), unless the authorization is terminated or revoked.     Resp Syncytial Virus by PCR NEGATIVE NEGATIVE Final    Comment: (NOTE) Fact Sheet for Patients: BloggerCourse.com  Fact Sheet for Healthcare Providers: SeriousBroker.it  This test is not yet approved or cleared by the [place] FDA and has been authorized for detection and/or diagnosis of SARS-CoV-2 by FDA under an Emergency Use Authorization (EUA). This  EUA will remain in effect (meaning this test can be used) for the duration of the COVID-19 declaration under Section 564(b)(1) of the Act, 21 U.S.C. section 360bbb-3(b)(1), unless the authorization is terminated or revoked.  Performed at [facility], [address], [place], [place] [address]   MRSA Next Gen by PCR, Nasal     Status: None   Collection Time: 09/12/23  6:01 AM   Specimen: Nasal Mucosa; Nasal Swab  Result Value Ref  Range Status   MRSA by PCR Next Gen NOT DETECTED NOT DETECTED Final    Comment: (NOTE) The GeneXpert MRSA Assay (FDA approved for NASAL specimens only), is one component of a comprehensive MRSA colonization surveillance program. It is not intended to diagnose MRSA infection nor to guide or monitor treatment for MRSA infections. Test performance is not FDA approved in patients less than 3 years old. Performed at [facility], [address], [place], [place] [address]   Blood culture (routine x 2)     Status: None   Collection Time: 09/12/23  6:23 AM   Specimen: BLOOD  Result Value Ref Range Status   Specimen Description   Final    BLOOD BLOOD RIGHT HAND Performed at [facility], [address] [place], [place] [address]    Special Requests   Final    BOTTLES DRAWN AEROBIC AND ANAEROBIC Blood Culture results may not be optimal due to an inadequate volume of blood received in culture bottles Performed at [facility], [address], [place], [place] [address]    Culture   Final    NO GROWTH 5 DAYS Performed at [facility], [address], [place], [place] [address]    Report Status 09/17/2023 FINAL  Final  Blood culture (routine x 2)     Status: Abnormal   Collection Time: 09/12/23  6:27 AM   Specimen: BLOOD LEFT FOREARM  Result Value Ref Range Status   Specimen Description   Final    BLOOD LEFT FOREARM Performed at [facility], [address] [place], [place] [address]    Special Requests   Final    BOTTLES DRAWN AEROBIC AND ANAEROBIC Blood Culture results may not be optimal due to an inadequate volume of blood received in culture bottles Performed at [facility], [address], [place], [place] [address]    Culture  Setup Time   Final    GRAM POSITIVE COCCI ANAEROBIC BOTTLE ONLY CRITICAL RESULT CALLED TO, READ BACK BY AND VERIFIED WITH: L POINDEXTER,PHARMD@[id number]  09/13/23 MK     Culture (A)  Final    STAPHYLOCOCCUS HOMINIS THE SIGNIFICANCE OF ISOLATING THIS ORGANISM FROM A SINGLE SET OF BLOOD CULTURES WHEN MULTIPLE SETS ARE DRAWN IS UNCERTAIN. PLEASE NOTIFY THE MICROBIOLOGY DEPARTMENT WITHIN ONE WEEK IF SPECIATION AND SENSITIVITIES ARE REQUIRED. Performed at [facility], [address], [place], [place] [address]    Report Status 09/14/2023 FINAL  Final  Blood Culture ID Panel (Reflexed)     Status: Abnormal   Collection Time: 09/12/23  6:27 AM  Result Value Ref Range Status   Enterococcus faecalis NOT DETECTED NOT DETECTED Final   Enterococcus Faecium NOT DETECTED NOT DETECTED Final   Listeria monocytogenes NOT DETECTED NOT DETECTED Final   Staphylococcus species DETECTED (A) NOT DETECTED Final    Comment: CRITICAL RESULT CALLED TO, READ BACK BY AND VERIFIED WITH: L POINDEXTER,PHARMD@0624  09/13/23 MK    Staphylococcus aureus (BCID) NOT  DETECTED NOT DETECTED Final   Staphylococcus epidermidis NOT DETECTED NOT DETECTED Final   Staphylococcus lugdunensis NOT DETECTED NOT DETECTED Final   Streptococcus species NOT DETECTED NOT DETECTED Final   Streptococcus agalactiae NOT DETECTED NOT DETECTED Final   Streptococcus pneumoniae NOT DETECTED NOT DETECTED Final   Streptococcus pyogenes NOT DETECTED NOT DETECTED Final   A.calcoaceticus-baumannii NOT DETECTED NOT DETECTED Final   Bacteroides fragilis NOT DETECTED NOT DETECTED Final   Enterobacterales NOT DETECTED NOT DETECTED Final   Enterobacter cloacae complex NOT DETECTED NOT DETECTED Final   Escherichia coli NOT DETECTED NOT DETECTED Final   Klebsiella aerogenes NOT DETECTED NOT DETECTED Final   Klebsiella oxytoca NOT DETECTED NOT DETECTED Final   Klebsiella pneumoniae NOT DETECTED NOT DETECTED Final   Proteus species NOT DETECTED NOT DETECTED Final   Salmonella species NOT DETECTED NOT DETECTED Final   Serratia marcescens NOT DETECTED NOT DETECTED Final   Haemophilus influenzae NOT DETECTED NOT DETECTED  Final   Neisseria meningitidis NOT DETECTED NOT DETECTED Final   Pseudomonas aeruginosa NOT DETECTED NOT DETECTED Final   Stenotrophomonas maltophilia NOT DETECTED NOT DETECTED Final   Candida albicans NOT DETECTED NOT DETECTED Final   Candida auris NOT DETECTED NOT DETECTED Final   Candida glabrata NOT DETECTED NOT DETECTED Final   Candida krusei NOT DETECTED NOT DETECTED Final   Candida parapsilosis NOT DETECTED NOT DETECTED Final   Candida tropicalis NOT DETECTED NOT DETECTED Final   Cryptococcus neoformans/gattii NOT DETECTED NOT DETECTED Final    Comment: Performed at [facility], [address] [place], [place] [address]     Labs: BNP (last 3 results) Recent Labs    09/12/23 0550 09/13/23 1210 09/14/23 0351  BNP 1,151.1* 538.5* 481.1*   Basic Metabolic Panel: Recent Labs  Lab 09/12/23 0454 09/13/23 0337 09/14/23 0351  NA 139 136 135  K 4.0 3.7 4.1  CL 103 103 104  CO2 23 24 23   GLUCOSE 138* 114* 113*  BUN 14 16 21   CREATININE 0.95 1.05* 0.87  CALCIUM 9.2 8.5* 8.3*   Liver Function Tests: Recent Labs  Lab 09/12/23 0454  AST 19  ALT 24  ALKPHOS 51  BILITOT 1.2  PROT 7.4  ALBUMIN 3.9   No results for input(s): "LIPASE", "AMYLASE" in the last 168 hours. No results for input(s): "AMMONIA" in the last 168 hours. CBC: Recent Labs  Lab 09/12/23 0550 09/13/23 0337 09/14/23 0351  WBC 10.1 7.5 6.1  NEUTROABS 8.8*  --   --   HGB 9.0* 9.5* 8.3*  HCT 29.0* 31.3* 27.7*  MCV 90.6 92.1 93.6  PLT 228 220 180   Cardiac Enzymes: No results for input(s): "CKTOTAL", "CKMB", "CKMBINDEX", "TROPONINI" in the last 168 hours. BNP: Invalid input(s): "POCBNP" CBG: Recent Labs  Lab 09/16/23 0725 09/16/23 1144 09/16/23 1614 09/16/23 2159 09/17/23 0726  GLUCAP 105* 156* 104* 109* 84   D-Dimer No results for input(s): "DDIMER" in the last 72 hours. Hgb A1c No results for input(s): "HGBA1C" in the last 72 hours. Lipid Profile No results for input(s):  "CHOL", "HDL", "LDLCALC", "TRIG", "CHOLHDL", "LDLDIRECT" in the last 72 hours. Thyroid function studies No results for input(s): "TSH", "T4TOTAL", "T3FREE", "THYROIDAB" in the last 72 hours.  Invalid input(s): "FREET3" Anemia work up No results for input(s): "VITAMINB12", "FOLATE", "FERRITIN", "TIBC", "IRON", "RETICCTPCT" in the last 72 hours. Urinalysis    Component Value Date/Time   COLORURINE YELLOW 09/12/2023 1633   APPEARANCEUR CLEAR 09/12/2023 1633   LABSPEC 1.034 (H) 09/12/2023 1633  PHURINE 5.0 09/12/2023 1633   GLUCOSEU NEGATIVE 09/12/2023 1633   HGBUR NEGATIVE 09/12/2023 1633   BILIRUBINUR NEGATIVE 09/12/2023 1633   BILIRUBINUR negative 12/07/2015 1226   KETONESUR NEGATIVE 09/12/2023 1633   PROTEINUR NEGATIVE 09/12/2023 1633   UROBILINOGEN 0.2 12/07/2015 1226   NITRITE NEGATIVE 09/12/2023 1633   LEUKOCYTESUR SMALL (A) 09/12/2023 1633   Sepsis Labs Recent Labs  Lab 09/12/23 0550 09/13/23 0337 09/14/23 0351  WBC 10.1 7.5 6.1   Microbiology Recent Results (from the past 240 hours)  Resp panel by RT-PCR (RSV, Flu A&B, Covid) Anterior Nasal Swab     Status: None   Collection Time: 09/12/23  4:54 AM   Specimen: Anterior Nasal Swab  Result Value Ref Range Status   SARS Coronavirus 2 by RT PCR NEGATIVE NEGATIVE Final    Comment: (NOTE) SARS-CoV-2 target nucleic acids are NOT DETECTED.  The SARS-CoV-2 RNA is generally detectable in upper respiratory specimens during the acute phase of infection. The lowest concentration of SARS-CoV-2 viral copies this assay can detect is 138 copies/mL. A negative result does not preclude SARS-Cov-2 infection and should not be used as the sole basis for treatment or other patient management decisions. A negative result may occur with  improper specimen collection/handling, submission of specimen other than nasopharyngeal swab, presence of viral mutation(s) within the areas targeted by this assay, and inadequate number of  viral copies(<138 copies/mL). A negative result must be combined with clinical observations, patient history, and epidemiological information. The expected result is Negative.  Fact Sheet for Patients:  BloggerCourse.com  Fact Sheet for Healthcare Providers:  SeriousBroker.it  This test is no t yet approved or cleared by the [place] FDA and  has been authorized for detection and/or diagnosis of SARS-CoV-2 by FDA under an Emergency Use Authorization (EUA). This EUA will remain  in effect (meaning this test can be used) for the duration of the COVID-19 declaration under Section 564(b)(1) of the Act, 21 U.S.C.section 360bbb-3(b)(1), unless the authorization is terminated  or revoked sooner.       Influenza A by PCR NEGATIVE NEGATIVE Final   Influenza B by PCR NEGATIVE NEGATIVE Final    Comment: (NOTE) The Xpert Xpress SARS-CoV-2/FLU/RSV plus assay is intended as an aid in the diagnosis of influenza from Nasopharyngeal swab specimens and should not be used as a sole basis for treatment. Nasal washings and aspirates are unacceptable for Xpert Xpress SARS-CoV-2/FLU/RSV testing.  Fact Sheet for Patients: BloggerCourse.com  Fact Sheet for Healthcare Providers: SeriousBroker.it  This test is not yet approved or cleared by the [place] FDA and has been authorized for detection and/or diagnosis of SARS-CoV-2 by FDA under an Emergency Use Authorization (EUA). This EUA will remain in effect (meaning this test can be used) for the duration of the COVID-19 declaration under Section 564(b)(1) of the Act, 21 U.S.C. section 360bbb-3(b)(1), unless the authorization is terminated or revoked.     Resp Syncytial Virus by PCR NEGATIVE NEGATIVE Final    Comment: (NOTE) Fact Sheet for Patients: BloggerCourse.com  Fact Sheet for Healthcare  Providers: SeriousBroker.it  This test is not yet approved or cleared by the [place] FDA and has been authorized for detection and/or diagnosis of SARS-CoV-2 by FDA under an Emergency Use Authorization (EUA). This EUA will remain in effect (meaning this test can be used) for the duration of the COVID-19 declaration under Section 564(b)(1) of the Act, 21 U.S.C. section 360bbb-3(b)(1), unless the authorization is terminated or revoked.  Performed at [facility], [address], [place], [place] [address]   MRSA Next Gen by PCR, Nasal     Status: None   Collection Time: 09/12/23  6:01 AM   Specimen: Nasal Mucosa; Nasal Swab  Result Value Ref Range Status   MRSA by PCR Next Gen NOT DETECTED NOT DETECTED Final    Comment: (NOTE) The GeneXpert MRSA Assay (FDA approved for NASAL specimens only), is one component of a comprehensive MRSA colonization surveillance program. It is not intended to diagnose MRSA infection nor to guide or monitor treatment for MRSA infections. Test performance is not FDA approved in patients less than 5 years old. Performed at [facility], [address] [place], [place] [address]   Blood culture (routine x 2)     Status: None   Collection Time: 09/12/23  6:23 AM   Specimen: BLOOD  Result Value Ref Range Status   Specimen Description   Final    BLOOD BLOOD RIGHT HAND Performed at [facility], [address], [place], [place] [address]    Special Requests   Final    BOTTLES DRAWN AEROBIC AND ANAEROBIC Blood Culture results may not be optimal due to an inadequate volume of blood received in culture bottles Performed at [facility], [address], [place], [place] [address]    Culture   Final    NO GROWTH 5 DAYS Performed at [facility], [address], [place], [place] [address]    Report Status 09/17/2023 FINAL  Final  Blood culture  (routine x 2)     Status: Abnormal   Collection Time: 09/12/23  6:27 AM   Specimen: BLOOD LEFT FOREARM  Result Value Ref Range Status   Specimen Description   Final    BLOOD LEFT FOREARM Performed at [facility], [address] [place], [place] [address]    Special Requests   Final    BOTTLES DRAWN AEROBIC AND ANAEROBIC Blood Culture results may not be optimal due to an inadequate volume of blood received in culture bottles Performed at [facility], [address] [place], [place] [address]    Culture  Setup Time   Final    GRAM POSITIVE COCCI ANAEROBIC BOTTLE ONLY CRITICAL RESULT CALLED TO, READ BACK BY AND VERIFIED WITH: L POINDEXTER,[email or web address]  09/13/23 MK    Culture (A)  Final    STAPHYLOCOCCUS HOMINIS THE SIGNIFICANCE OF ISOLATING THIS ORGANISM FROM A SINGLE SET OF BLOOD CULTURES WHEN MULTIPLE SETS ARE DRAWN IS UNCERTAIN. PLEASE NOTIFY THE MICROBIOLOGY DEPARTMENT WITHIN ONE WEEK IF SPECIATION AND SENSITIVITIES ARE REQUIRED. Performed at [facility], [address] [place], [place] [address]    Report Status 09/14/2023 FINAL  Final  Blood Culture ID Panel (Reflexed)     Status: Abnormal   Collection Time: 09/12/23  6:27 AM  Result Value Ref Range Status   Enterococcus faecalis NOT DETECTED NOT DETECTED Final   Enterococcus Faecium NOT DETECTED NOT DETECTED Final   Listeria monocytogenes NOT DETECTED NOT DETECTED Final   Staphylococcus species DETECTED (A) NOT DETECTED Final    Comment: CRITICAL RESULT CALLED TO, READ BACK BY AND VERIFIED WITH: L POINDEXTER,[email or web address]  09/13/23 MK    Staphylococcus aureus (BCID) NOT DETECTED NOT DETECTED Final   Staphylococcus epidermidis NOT DETECTED NOT DETECTED Final   Staphylococcus lugdunensis NOT DETECTED NOT DETECTED Final   Streptococcus species NOT DETECTED NOT DETECTED Final   Streptococcus agalactiae NOT DETECTED NOT DETECTED Final  Streptococcus pneumoniae NOT DETECTED NOT DETECTED Final    Streptococcus pyogenes NOT DETECTED NOT DETECTED Final   A.calcoaceticus-baumannii NOT DETECTED NOT DETECTED Final   Bacteroides fragilis NOT DETECTED NOT DETECTED Final   Enterobacterales NOT DETECTED NOT DETECTED Final   Enterobacter cloacae complex NOT DETECTED NOT DETECTED Final   Escherichia coli NOT DETECTED NOT DETECTED Final   Klebsiella aerogenes NOT DETECTED NOT DETECTED Final   Klebsiella oxytoca NOT DETECTED NOT DETECTED Final   Klebsiella pneumoniae NOT DETECTED NOT DETECTED Final   Proteus species NOT DETECTED NOT DETECTED Final   Salmonella species NOT DETECTED NOT DETECTED Final   Serratia marcescens NOT DETECTED NOT DETECTED Final   Haemophilus influenzae NOT DETECTED NOT DETECTED Final   Neisseria meningitidis NOT DETECTED NOT DETECTED Final   Pseudomonas aeruginosa NOT DETECTED NOT DETECTED Final   Stenotrophomonas maltophilia NOT DETECTED NOT DETECTED Final   Candida albicans NOT DETECTED NOT DETECTED Final   Candida auris NOT DETECTED NOT DETECTED Final   Candida glabrata NOT DETECTED NOT DETECTED Final   Candida krusei NOT DETECTED NOT DETECTED Final   Candida parapsilosis NOT DETECTED NOT DETECTED Final   Candida tropicalis NOT DETECTED NOT DETECTED Final   Cryptococcus neoformans/gattii NOT DETECTED NOT DETECTED Final    Comment: Performed at [facility], [address], [place], [place] [address]     Time coordinating discharge: Over 30 minutes  SIGNED:   Alvira Philips [place], DO  [facility] 09/17/2023, 10:03 AM

## 2023-09-17 NOTE — Progress Notes (Signed)
 Patient discharged home.  IV removed - WNL.  Reviewed AVS and medications, instructed to follow up with PCP and cardio in 1 week. No questions at this time, patient in NAD at time of DC.

## 2023-09-26 ENCOUNTER — Ambulatory Visit: Attending: Internal Medicine | Admitting: Physical Therapy

## 2023-09-26 ENCOUNTER — Encounter: Payer: Self-pay | Admitting: Physical Therapy

## 2023-09-26 VITALS — HR 80

## 2023-09-26 DIAGNOSIS — R2689 Other abnormalities of gait and mobility: Secondary | ICD-10-CM

## 2023-09-26 DIAGNOSIS — M6281 Muscle weakness (generalized): Secondary | ICD-10-CM | POA: Diagnosis present

## 2023-09-26 DIAGNOSIS — R531 Weakness: Secondary | ICD-10-CM | POA: Diagnosis not present

## 2023-09-26 DIAGNOSIS — J189 Pneumonia, unspecified organism: Secondary | ICD-10-CM | POA: Insufficient documentation

## 2023-09-26 DIAGNOSIS — R2681 Unsteadiness on feet: Secondary | ICD-10-CM

## 2023-09-26 NOTE — Therapy (Signed)
 OUTPATIENT PHYSICAL THERAPY NEURO EVALUATION   Patient Name: Lynn Estrada MRN: [id number] DOB:1960-12-03, 63 y.o., female Today's Date: 09/26/2023   PCP: Porfirio Oar, PA REFERRING PROVIDER: Porfirio Oar, PA  END OF SESSION:  PT End of Session - 09/26/23 1213     Visit Number 1    Number of Visits 9    Date for PT Re-Evaluation 11/01/23    Authorization Type [place] MEDICAID [employer]LLCARE    PT Start Time 1016    PT Stop Time 1048    PT Time Calculation (min) 32 min    Equipment Utilized During Treatment Gait belt;Other (comment)   clinic's RW used (pt brought her SPC to appt)   Activity Tolerance Patient limited by fatigue    Behavior During Therapy [facility] for tasks assessed/performed             Past Medical History:  Diagnosis Date   Anemia    Arthritis    CHF (congestive heart failure) (HCC) 12/06/2022   EF is 20% to 25 %   COPD (chronic obstructive pulmonary disease) (HCC)    Diabetes mellitus without complication (HCC)    Eczema    GERD (gastroesophageal reflux disease)    History of adenomatous polyp of colon    08-03-2016  tubular adenoma x2 and hyperplastic   History of chronic gastritis 08/03/2016   History of ectopic pregnancy 1988   s/p  left salpingectomy   History of endometriosis    History of kidney stones    History of partial nephrectomy    right pelvis for very large stone   History of pneumonia 07/02/2016   CAP   History of sepsis    07-01-2016  sepsis w/ pyelonephritis, CAP /   12-31-2016  urosepsis w/ kidney stone obstruction   History of uterine leiomyoma    Hx of adenomatous colonic polyps 08/03/2016   2 adenomas, 1 hpp and 1 lost polyp 2018 all < 1 cm    Hyperlipidemia    Hypertension    Left ureteral stone    Myocardial infarction [facility])    Nephrolithiasis    left obstructive stone and right non-obstructive stone  per CT 12-31-2016   Urgency of urination    Past Surgical History:  Procedure Laterality Date   ABDOMINAL  HYSTERECTOMY  01/20/2007   w/ Lysis Adhesions/  Left salpingoophorectomy/  Right Salpingectomy   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   Procedure: BIV ICD INSERTION CRT-D;  Surgeon: Maurice Small, MD;  [facility]: [facility];  Service: Cardiovascular;  Laterality: N/A;   CHOLECYSTECTOMY  1994   and Right Partial Nephrectomy (pelvis for very large stone)   COLONOSCOPY WITH PROPOFOL N/A 03/26/2023   Procedure: COLONOSCOPY WITH PROPOFOL;  Surgeon: Iva Boop, MD;  Location: [facility];  Service: Gastroenterology;  Laterality: N/A;   CYSTOSCOPY W/ URETERAL STENT PLACEMENT Left 12/31/2016   Procedure: CYSTOSCOPY WITH RETROGRADE PYELOGRAM/ LEFT URETERAL STENT PLACEMENT;  Surgeon: Sebastian Ache, MD;  Location: [facility];  Service: Urology;  Laterality: Left;   CYSTOSCOPY WITH RETROGRADE PYELOGRAM, URETEROSCOPY AND STENT PLACEMENT Left 01/18/2017   Procedure: 1ST STAGE CYSTOSCOPY WITH RETROGRADE PYELOGRAM, URETEROSCOPY AND STENT REPLACEMENT;  Surgeon: Sebastian Ache, MD;  Location: [facility];  Service: Urology;  Laterality: Left;   CYSTOSCOPY WITH RETROGRADE PYELOGRAM, URETEROSCOPY AND STENT PLACEMENT Left 02/01/2017   Procedure: 2ND STAGE CYSTOSCOPY WITH RETROGRADE PYELOGRAM, URETEROSCOPY AND STENT REPLACEMENT;  Surgeon: Sebastian Ache, MD;  Location: [facility];  Service: Urology;  Laterality: Left;   ECTOPIC PREGNANCY SURGERY  1988   Left Salpingectomy   HOLMIUM LASER APPLICATION Left 01/18/2017   Procedure: HOLMIUM LASER APPLICATION;  Surgeon: Sebastian Ache, MD;  Location: [facility];  Service: Urology;  Laterality: Left;   HOLMIUM LASER APPLICATION Left 02/01/2017   Procedure: HOLMIUM LASER APPLICATION;  Surgeon: Sebastian Ache, MD;  Location: [facility];  Service: Urology;  Laterality: Left;   LUMBAR DISC SURGERY  01/1999   right L5 -- S1   POLYPECTOMY  03/26/2023   Procedure: POLYPECTOMY;  Surgeon: Iva Boop,  MD;  Location: [facility];  Service: Gastroenterology;;   RE-EXPLORATION LUMBAR/  LAMINECTOMY AND MICRODISKECTOMY  10/14/2001   right L5 -- S1   RIGHT HEART CATH N/A 05/23/2022   Procedure: RIGHT HEART CATH;  Surgeon: Dolores Patty, MD;  [facility]: [facility];  Service: Cardiovascular;  Laterality: N/A;   RIGHT/LEFT HEART CATH AND CORONARY ANGIOGRAPHY N/A 07/18/2021   Procedure: RIGHT/LEFT HEART CATH AND CORONARY ANGIOGRAPHY;  Surgeon: Dolores Patty, MD;  Location: [facility];  Service: Cardiovascular;  Laterality: N/A;   TUBAL LIGATION Bilateral 10/17/1999   PPTL   UMBILICAL HERNIA REPAIR  age 51   Patient Active Problem List   Diagnosis Date Noted   HCAP (healthcare-associated pneumonia) 09/12/2023   Acute on chronic combined systolic and diastolic heart failure (HCC) 08/23/2023   Frequency of urination and polyuria 08/23/2023   Cerebrovascular accident (CVA) (HCC) 05/26/2023   Pyuria 05/25/2023   Left-sided weakness 05/24/2023   Epistaxis 04/22/2023   Benign neoplasm of cecum 03/26/2023   Benign neoplasm of descending colon 03/26/2023   Benign neoplasm of sigmoid colon 03/26/2023   Adhesive capsulitis of right shoulder 08/24/2022   Mass of right hand 02/22/2022   LBBB (left bundle branch block) 02/01/2022   Type 2 diabetes mellitus with hyperglycemia, without long-term current use of insulin (HCC) 01/22/2022   Hypertriglyceridemia 01/21/2022   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 10/26/2021   Chronic combined systolic and diastolic heart failure (HCC) 07/27/2021   Acute combined systolic (congestive) and diastolic (congestive) heart failure (HCC)    Hypocalcemia 07/12/2021   Elevated troponin 07/12/2021   Lipoma of hand 07/20/2020   Osteoarthritis of carpometacarpal (CMC) joint of thumb 07/20/2020   Pain in right hand 07/20/2020   Radial styloid tenosynovitis 07/20/2020   Other osteoarthritis of spine 10/23/2019   Suspected  COVID-19 virus infection 06/11/2019   Acute respiratory failure with hypoxia (HCC) 06/11/2019   Multifocal pneumonia 06/11/2019   Carpal tunnel syndrome, bilateral 04/24/2019   Ureteral stone with hydronephrosis 12/31/2016   Hx of adenomatous colonic polyps 08/03/2016   Normocytic anemia 07/10/2016   CAP (community acquired pneumonia) 07/06/2016   Essential hypertension 07/26/2015   Eczema 07/26/2015   Overweight (BMI 25.0-29.9) 07/26/2015    ONSET DATE: 08-23-23 hospitalization; 2nd hospitalization 09-12-23  REFERRING DIAG: J18.9 (ICD-10-CM) - Multifocal pneumonia J18.9 (ICD-10-CM) - HCAP (healthcare-associated pneumonia) R53.1 (ICD-10-CM) - Left-sided weakness  THERAPY DIAG:  Other abnormalities of gait and mobility  Muscle weakness (generalized)  Unsteadiness on feet  Rationale for Evaluation and Treatment: Rehabilitation  SUBJECTIVE:  SUBJECTIVE STATEMENT:  Goes by "LYNN" Pt using SPC - states this is first time using cane outside; uses RW in the house for stability Pt accompanied by: self  PERTINENT HISTORY:  PMH: HTN, gastritis, GERD, CHF, COPD, DM, CAD, HLD; Rt CVA 05/2023  hospitalized 3-13 - 09-17-23 with multifocal pneumonia Per chart note "Lori-Ann Lindfors Ericson is a 63 y.o. female with past medical history significant for chronic combined systolic/diastolic congestive heart failure (LVEF 20%), s/p BiV PPM, HTN, HLD, DM2, GERD who presented to [facility] ED on 3/13 with cough, fatigue, body aches, shortness of breath.  Patient endorses productive cough with report of blood tinged sputum.  With nausea and vomiting."   Recently hospitalized 2/21 through 2/25 for UTI and community-acquired pneumonia, also with concern for decompensated CHF and was diuresed with Lasix.  [name] was  held following hospitalization but resumed in the outpatient setting after seeing cardiology outpatient.  Has not been on Lasix since her hospitalization, unclear why this is.  Heart failure with defibrillator    PAIN:  Are you having pain? No  PRECAUTIONS: ICD/Pacemaker: Fall and Other: decreased endurance/activity tolerance  RED FLAGS: None   WEIGHT BEARING RESTRICTIONS: No  FALLS: Has patient fallen in last 6 months?  Had 1 near fall since discharge from hospital   LIVING ENVIRONMENT: Lives with: lives with their family Lives in: House/apartment Stairs: No Has following equipment at home: Single point cane and Walker - 2 wheeled  PLOF: Independent  PATIENT GOALS: improve mobility and be more active   OBJECTIVE:  Note: Objective measures were completed at Evaluation unless otherwise noted.  DIAGNOSTIC FINDINGS:  MRI brain 05/24/23: IMPRESSION: 1. Small acute infarct in the cortex of the posterior right cingulate gyrus (right ACA territory). 2. Tiny remote infarct in the right cerebellar hemisphere. 3. Scattered foci of T2 hyperintensity within the white matter of the cerebral hemispheres, nonspecific but may represent moderate chronic microvascular ischemic changes.  COGNITION: Overall cognitive status: Within functional limits for tasks assessed   SENSATION: WFL  COORDINATION: NT  POSTURE: No Significant postural limitations  LOWER EXTREMITY ROM:  WFL's bil. LE's    LOWER EXTREMITY MMT:    MMT Right Eval Left Eval  Hip flexion 4 3+  Hip extension    Hip abduction    Hip adduction    Hip internal rotation    Hip external rotation    Knee flexion 5 4  Knee extension 4 4  Ankle dorsiflexion 5 5  Ankle plantarflexion    Ankle inversion    Ankle eversion    (Blank rows = not tested)  BED MOBILITY:  Independent per pt report - NT at eval  TRANSFERS: Assistive device utilized: Environmental consultant - 2 wheeled  Sit to stand: Modified independence Stand to  sit: Modified independence    GAIT: Gait pattern: step through pattern Distance walked: 50' Assistive device utilized: Environmental consultant - 2 wheeled Level of assistance: SBA Comments: slow gait speed due to fatigue and balance deficits  FUNCTIONAL TESTS:  5 times sit to stand: 54.53 secs from chair with bil. UE support Timed up and go (TUG): 15.28 secs with RW 2 minute walk test: unable to assess during eval session due to c/o fatigue 10 meter walk test: 17.44 secs with RW  = 1.88 ft/sec with RW  TREATMENT DATE: eval only on 09-26-23 - limited activity tolerance due to fatigue    PATIENT EDUCATION: Education details: POC -discussed frequency and duration - pt requests 4 weeks to start Person educated: Patient Education method: Explanation Education comprehension: verbalized understanding  HOME EXERCISE PROGRAM: To be established  GOALS: Goals reviewed with patient? Yes  LONG TERM GOALS: Target date: 10-25-23  Pt will be modified independent with household ambulation without device.  Baseline: pt is currently using RW for assistance with household ambulation Goal status: INITIAL  2.  Pt will improve 5x sit to stand score to </= 30 secs from chair with bil. UE support to demo increased bil. LE strength and activity tolerance. Baseline: 54.53 secs from chair with bil. UE support Goal status: INITIAL  3.  Pt will amb. 3" nonstop with use of SPC to demo increased endurance/activity tolerance. Baseline: pt able to amb. Only approx. 40' from lobby to treatment room with SPC at eval Goal status: INITIAL  4.  Improve TUG score to </=  13.0 secs without device. Baseline: 15.28 secs with RW Goal status: INITIAL  5.  Increase gait velocity to 2.1 ft/sec with SPC for increased gait efficiency. Baseline: 1.88 ft/sec with RW Goal status: INITIAL  6.  Independent in  HEP for balance and endurance (including walking program). Baseline:  Goal status: INITIAL  ASSESSMENT:  CLINICAL IMPRESSION: Patient is a 63 y.o. lady who was seen today for physical therapy evaluation and treatment for deconditioning due to recent hospitalization for multifocal pneumonia.  Pt also s/p Rt CVA Nov. 2024 and presents with mild LLE weakness.  Pt is at fall risk per TUG score of 15.28 secs with use of RW and has decreased gait speed of 1.88 ft/sec with RW.  Pt used SPC to ambulate to today's evaluation appt but requested to use RW for gait assessments due to fatigue, imbalance and fear of falling.  Pt reported today was first day she had used [facility] since discharge from hospital.  Pt reported moderate fatigue after approx. 30" of eval session and requested to hold 2" walk test assessment until next session.  Pt will benefit from PT to address endurance/activity tolerance, balance and gait impairments and LLE weakness.   OBJECTIVE IMPAIRMENTS: cardiopulmonary status limiting activity, decreased activity tolerance, decreased balance, decreased endurance, difficulty walking, and decreased strength.   ACTIVITY LIMITATIONS: carrying, lifting, standing, squatting, stairs, and locomotion level  PARTICIPATION LIMITATIONS: meal prep, cleaning, laundry, driving, shopping, community activity, and yard work  PERSONAL FACTORS: Past/current experiences and 1-2 comorbidities: h/o heart failure and s/p Rt CVA  are also affecting patient's functional outcome.   REHAB POTENTIAL: Good  CLINICAL DECISION MAKING: Evolving/moderate complexity  EVALUATION COMPLEXITY: Moderate  PLAN:  PT FREQUENCY: 2x/week  PT DURATION: 4 weeks  PLANNED INTERVENTIONS: 97110-Therapeutic exercises, 97530- Therapeutic activity, 97112- Neuromuscular re-education, 97535- Self Care, and 40981- Gait training  PLAN FOR NEXT SESSION: do 2" walk test; issue HEP - sit to stand, walking program, balance exercises   Blayde Bacigalupi,  Donavan Burnet, PT 09/26/2023, 12:24 PM    Check all possible CPT codes: 97110- Therapeutic Exercise, [phone]- Neuro Re-education, [phone] - Gait Training, [phone] - Therapeutic Activities, and 65784 - Self Care    Check all conditions that are expected to impact treatment: Diabetes mellitus and Neurological condition and/or seizures   If treatment provided at initial evaluation, no treatment charged due to lack of authorization.

## 2023-09-28 ENCOUNTER — Telehealth: Payer: Self-pay | Admitting: Cardiology

## 2023-09-28 NOTE — Telephone Encounter (Signed)
   Patient with combined systolic/diastolic congestive heart failure (LVEF 20%), s/p BiV PPM admitted earlier this month with multi-focal pneumonia. Initially required O2 support but was able to be weaned off and was discharged on Mucinex, Tussionex, Tessalon Perles after completing ABX. Patient seen yesterday for follow up at her PCP's office, per notes, patient still feeling very tired/fatigued as well as short of breath. Physical exam yesterday without decreased breath sounds, wheezing, rhonchi, rales. Unclear if pulse oximetry checked at PCP.  Patient called today to request an order for oxygen. She expresses concern of nocturnal dyspnea/orthopnea. Reports compliance with all HF medications including Lasix, is not seeing swelling in legs or abdomen and feels weight is stable. Explained that receiving oxygen requires testing to qualify as well as coordination with medical supply company. No obvious dyspnea on the phone but explained to patient that if she continues to feel increasingly short of breath, she should seek immediate in-person evaluation.  Perlie Gold, PA-C

## 2023-09-30 ENCOUNTER — Emergency Department (HOSPITAL_BASED_OUTPATIENT_CLINIC_OR_DEPARTMENT_OTHER)

## 2023-09-30 ENCOUNTER — Other Ambulatory Visit: Payer: Self-pay

## 2023-09-30 ENCOUNTER — Emergency Department (HOSPITAL_COMMUNITY)

## 2023-09-30 ENCOUNTER — Encounter (HOSPITAL_COMMUNITY): Payer: Self-pay

## 2023-09-30 ENCOUNTER — Observation Stay (HOSPITAL_COMMUNITY)
Admission: EM | Admit: 2023-09-30 | Discharge: 2023-10-02 | Disposition: A | Discharge status: Home / Self Care | Attending: Internal Medicine | Admitting: Internal Medicine

## 2023-09-30 DIAGNOSIS — I5043 Acute on chronic combined systolic (congestive) and diastolic (congestive) heart failure: Secondary | ICD-10-CM | POA: Diagnosis not present

## 2023-09-30 DIAGNOSIS — Z1152 Encounter for screening for COVID-19: Secondary | ICD-10-CM | POA: Diagnosis not present

## 2023-09-30 DIAGNOSIS — Z8673 Personal history of transient ischemic attack (TIA), and cerebral infarction without residual deficits: Secondary | ICD-10-CM | POA: Insufficient documentation

## 2023-09-30 DIAGNOSIS — I2694 Multiple subsegmental pulmonary emboli without acute cor pulmonale: Secondary | ICD-10-CM | POA: Diagnosis not present

## 2023-09-30 DIAGNOSIS — E785 Hyperlipidemia, unspecified: Secondary | ICD-10-CM | POA: Insufficient documentation

## 2023-09-30 DIAGNOSIS — Z950 Presence of cardiac pacemaker: Secondary | ICD-10-CM | POA: Insufficient documentation

## 2023-09-30 DIAGNOSIS — D649 Anemia, unspecified: Secondary | ICD-10-CM | POA: Diagnosis not present

## 2023-09-30 DIAGNOSIS — Z79899 Other long term (current) drug therapy: Secondary | ICD-10-CM | POA: Diagnosis not present

## 2023-09-30 DIAGNOSIS — Z91040 Latex allergy status: Secondary | ICD-10-CM | POA: Insufficient documentation

## 2023-09-30 DIAGNOSIS — I700 Atherosclerosis of aorta: Secondary | ICD-10-CM | POA: Insufficient documentation

## 2023-09-30 DIAGNOSIS — R7989 Other specified abnormal findings of blood chemistry: Secondary | ICD-10-CM | POA: Diagnosis not present

## 2023-09-30 DIAGNOSIS — J9601 Acute respiratory failure with hypoxia: Secondary | ICD-10-CM | POA: Insufficient documentation

## 2023-09-30 DIAGNOSIS — Z7984 Long term (current) use of oral hypoglycemic drugs: Secondary | ICD-10-CM | POA: Insufficient documentation

## 2023-09-30 DIAGNOSIS — F17210 Nicotine dependence, cigarettes, uncomplicated: Secondary | ICD-10-CM | POA: Insufficient documentation

## 2023-09-30 DIAGNOSIS — I5041 Acute combined systolic (congestive) and diastolic (congestive) heart failure: Secondary | ICD-10-CM | POA: Diagnosis present

## 2023-09-30 DIAGNOSIS — E1165 Type 2 diabetes mellitus with hyperglycemia: Secondary | ICD-10-CM | POA: Diagnosis present

## 2023-09-30 DIAGNOSIS — M79662 Pain in left lower leg: Secondary | ICD-10-CM | POA: Diagnosis not present

## 2023-09-30 DIAGNOSIS — R0602 Shortness of breath: Principal | ICD-10-CM

## 2023-09-30 DIAGNOSIS — E119 Type 2 diabetes mellitus without complications: Secondary | ICD-10-CM | POA: Insufficient documentation

## 2023-09-30 DIAGNOSIS — I2699 Other pulmonary embolism without acute cor pulmonale: Principal | ICD-10-CM | POA: Diagnosis present

## 2023-09-30 DIAGNOSIS — I2510 Atherosclerotic heart disease of native coronary artery without angina pectoris: Secondary | ICD-10-CM | POA: Insufficient documentation

## 2023-09-30 DIAGNOSIS — I110 Hypertensive heart disease with heart failure: Secondary | ICD-10-CM | POA: Insufficient documentation

## 2023-09-30 LAB — CBC WITH DIFFERENTIAL/PLATELET
Abs Immature Granulocytes: 0.02 10*3/uL (ref 0.00–0.07)
Basophils Absolute: 0 10*3/uL (ref 0.0–0.1)
Basophils Relative: 0 %
Eosinophils Absolute: 0 10*3/uL (ref 0.0–0.5)
Eosinophils Relative: 1 %
HCT: 32.2 % — ABNORMAL LOW (ref 36.0–46.0)
Hemoglobin: 9.9 g/dL — ABNORMAL LOW (ref 12.0–15.0)
Immature Granulocytes: 0 %
Lymphocytes Relative: 17 %
Lymphs Abs: 1 10*3/uL (ref 0.7–4.0)
MCH: 27.8 pg (ref 26.0–34.0)
MCHC: 30.7 g/dL (ref 30.0–36.0)
MCV: 90.4 fL (ref 80.0–100.0)
Monocytes Absolute: 0.5 10*3/uL (ref 0.1–1.0)
Monocytes Relative: 9 %
Neutro Abs: 4.2 10*3/uL (ref 1.7–7.7)
Neutrophils Relative %: 73 %
Platelets: 263 10*3/uL (ref 150–400)
RBC: 3.56 MIL/uL — ABNORMAL LOW (ref 3.87–5.11)
RDW: 15.6 % — ABNORMAL HIGH (ref 11.5–15.5)
WBC: 5.7 10*3/uL (ref 4.0–10.5)
nRBC: 0 % (ref 0.0–0.2)

## 2023-09-30 LAB — RESP PANEL BY RT-PCR (RSV, FLU A&B, COVID)  RVPGX2
Influenza A by PCR: NEGATIVE
Influenza B by PCR: NEGATIVE
Resp Syncytial Virus by PCR: NEGATIVE
SARS Coronavirus 2 by RT PCR: NEGATIVE

## 2023-09-30 LAB — COMPREHENSIVE METABOLIC PANEL WITH GFR
ALT: 27 U/L (ref 0–44)
AST: 23 U/L (ref 15–41)
Albumin: 3.9 g/dL (ref 3.5–5.0)
Alkaline Phosphatase: 55 U/L (ref 38–126)
Anion gap: 10 (ref 5–15)
BUN: 19 mg/dL (ref 8–23)
CO2: 22 mmol/L (ref 22–32)
Calcium: 9.1 mg/dL (ref 8.9–10.3)
Chloride: 107 mmol/L (ref 98–111)
Creatinine, Ser: 0.91 mg/dL (ref 0.44–1.00)
GFR, Estimated: >60 mL/min (ref >60)
Glucose, Bld: 96 mg/dL (ref 70–99)
Potassium: 4 mmol/L (ref 3.5–5.1)
Sodium: 139 mmol/L (ref 135–145)
Total Bilirubin: 0.6 mg/dL (ref 0.0–1.2)
Total Protein: 7.2 g/dL (ref 6.5–8.1)

## 2023-09-30 LAB — TROPONIN I (HIGH SENSITIVITY)
Troponin I (High Sensitivity): 28 ng/L — ABNORMAL HIGH (ref <18)
Troponin I (High Sensitivity): 32 ng/L — ABNORMAL HIGH (ref <18)

## 2023-09-30 LAB — DIGOXIN LEVEL: Digoxin Level: 0.6 ng/mL — ABNORMAL LOW (ref 0.8–2.0)

## 2023-09-30 LAB — BRAIN NATRIURETIC PEPTIDE: B Natriuretic Peptide: 690.8 pg/mL — ABNORMAL HIGH (ref 0.0–100.0)

## 2023-09-30 MED ORDER — APIXABAN 5 MG PO TABS
10.0000 mg | ORAL_TABLET | Freq: Two times a day (BID) | ORAL | Status: DC
  Administered 2023-09-30 – 2023-10-02 (×4): 10 mg via ORAL
  Filled 2023-09-30 (×4): qty 2

## 2023-09-30 MED ORDER — APIXABAN 5 MG PO TABS: 5.0000 mg | ORAL_TABLET | Freq: Two times a day (BID) | ORAL | Status: DC

## 2023-09-30 MED ORDER — FUROSEMIDE 10 MG/ML IJ SOLN
40.0000 mg | Freq: Once | INTRAMUSCULAR | Status: AC
  Administered 2023-09-30: 40 mg via INTRAVENOUS
  Filled 2023-09-30: qty 4

## 2023-09-30 MED ORDER — ACETAMINOPHEN 650 MG RE SUPP: 650.0000 mg | Freq: Four times a day (QID) | RECTAL | Status: DC | PRN

## 2023-09-30 MED ORDER — SPIRONOLACTONE 25 MG PO TABS
25.0000 mg | ORAL_TABLET | Freq: Every day | ORAL | Status: DC
  Administered 2023-09-30 – 2023-10-02 (×3): 25 mg via ORAL
  Filled 2023-09-30 (×3): qty 1

## 2023-09-30 MED ORDER — ONDANSETRON HCL 4 MG/2ML IJ SOLN: 4.0000 mg | Freq: Four times a day (QID) | INTRAMUSCULAR | Status: DC | PRN

## 2023-09-30 MED ORDER — FUROSEMIDE 40 MG PO TABS
20.0000 mg | ORAL_TABLET | Freq: Every day | ORAL | Status: DC
  Administered 2023-09-30 – 2023-10-02 (×3): 20 mg via ORAL
  Filled 2023-09-30 (×3): qty 1

## 2023-09-30 MED ORDER — DIGOXIN 125 MCG PO TABS
125.0000 ug | ORAL_TABLET | Freq: Every day | ORAL | Status: DC
  Administered 2023-09-30 – 2023-10-02 (×3): 125 ug via ORAL
  Filled 2023-09-30 (×3): qty 1

## 2023-09-30 MED ORDER — MIDODRINE HCL 5 MG PO TABS
5.0000 mg | ORAL_TABLET | Freq: Three times a day (TID) | ORAL | Status: DC
  Administered 2023-10-01 – 2023-10-02 (×6): 5 mg via ORAL
  Filled 2023-09-30 (×6): qty 1

## 2023-09-30 MED ORDER — IOHEXOL 350 MG/ML SOLN
75.0000 mL | Freq: Once | INTRAVENOUS | Status: AC | PRN
  Administered 2023-09-30: 75 mL via INTRAVENOUS

## 2023-09-30 MED ORDER — ATORVASTATIN CALCIUM 10 MG PO TABS
10.0000 mg | ORAL_TABLET | Freq: Every morning | ORAL | Status: DC
  Administered 2023-10-01 – 2023-10-02 (×2): 10 mg via ORAL
  Filled 2023-09-30 (×2): qty 1

## 2023-09-30 MED ORDER — CLOPIDOGREL BISULFATE 75 MG PO TABS
75.0000 mg | ORAL_TABLET | Freq: Every day | ORAL | Status: DC
  Administered 2023-09-30 – 2023-10-02 (×3): 75 mg via ORAL
  Filled 2023-09-30 (×3): qty 1

## 2023-09-30 MED ORDER — BISOPROLOL FUMARATE 5 MG PO TABS
2.5000 mg | ORAL_TABLET | Freq: Every day | ORAL | Status: DC
Start: 2023-09-30 — End: 2023-10-02
  Administered 2023-09-30 – 2023-10-02 (×3): 2.5 mg via ORAL
  Filled 2023-09-30 (×3): qty 1

## 2023-09-30 MED ORDER — OXYCODONE-ACETAMINOPHEN 5-325 MG PO TABS
1.0000 | ORAL_TABLET | Freq: Once | ORAL | Status: AC
  Administered 2023-09-30: 1 via ORAL
  Filled 2023-09-30: qty 1

## 2023-09-30 MED ORDER — ACETAMINOPHEN 325 MG PO TABS
650.0000 mg | ORAL_TABLET | Freq: Four times a day (QID) | ORAL | Status: DC | PRN
  Administered 2023-10-01: 650 mg via ORAL
  Filled 2023-09-30: qty 2

## 2023-09-30 MED ORDER — ONDANSETRON HCL 4 MG PO TABS: 4.0000 mg | ORAL_TABLET | Freq: Four times a day (QID) | ORAL | Status: DC | PRN

## 2023-09-30 NOTE — ED Provider Notes (Signed)
 [place] EMERGENCY DEPARTMENT AT [facility] Provider Note   CSN: [id number] Arrival date & time: 09/30/23  1210     History  Chief Complaint  Patient presents with   Shortness of Breath    Lynn Estrada is a 63 y.o. female.   Shortness of Breath Associated symptoms: cough   Patient presents for shortness of breath.  Medical history includes HTN, CHF, DM, CVA, GERD, HLD.  Per chart review, last echocardiogram was 1 month ago.  At the time, she had hypokinesis, LVEF of 20 to 25%, with grade 2 diastolic dysfunction.  She had 2 recent admissions for pneumonia, most recently 2 and half weeks ago.  She states that she is currently taking ongoing antibiotics for this.  Per chart review, antibiotic appears to be Augmentin.  Home medications include digoxin, Lasix (20 mg daily), midodrine, spironolactone, Jardiance.  She has had a cough productive of white sputum which has been improving.  She has had increasing swelling in her feet.  Patient describes progressive shortness of breath for the past 2 days.  She has had poor sleep due to orthopnea.  She remains short of breath at rest.  She has been adherent to her medications, but has not taken any today.     Home Medications Prior to Admission medications   Medication Sig Start Date End Date Taking? Authorizing Provider  acetaminophen (TYLENOL) 500 MG tablet Take 500-1,000 mg by mouth every 6 (six) hours as needed (for pain.).    [provider]  atorvastatin (LIPITOR) 10 MG tablet Take 10 mg by mouth in the morning. 05/15/21   [provider]  benzonatate (TESSALON) 200 MG capsule Take 1 capsule (200 mg total) by mouth 3 (three) times daily as needed for cough (not relieved with tussionex). 09/17/23   [place], Alvira Philips, DO  bisoprolol (ZEBETA) 5 MG tablet Take 0.5 tablets (2.5 mg total) by mouth daily. 07/19/23   Bensimhon, Bevelyn Buckles, MD  Calcium Carbonate Antacid (TUMS E-X PO) Take 1 tablet by mouth daily.     [provider]  chlorpheniramine-HYDROcodone (TUSSIONEX) 10-8 MG/5ML Take 5 mLs by mouth every 12 (twelve) hours as needed for cough. 09/17/23   [place], Alvira Philips, DO  clopidogrel (PLAVIX) 75 MG tablet Take 1 tablet (75 mg total) by mouth daily. 06/24/23 06/18/24  Butch Penny, NP  digoxin (LANOXIN) 0.125 MG tablet Take 1 tablet (125 mcg total) by mouth daily. 09/03/23   Bensimhon, Bevelyn Buckles, MD  furosemide (LASIX) 20 MG tablet Take 1 tablet (20 mg total) by mouth daily. 08/27/23 08/26/24  Alwyn Ren, MD  guaiFENesin (MUCINEX) 600 MG 12 hr tablet Take 1 tablet (600 mg total) by mouth 2 (two) times daily for 14 days. 09/17/23 10/01/23  [place], Eric J, DO  JARDIANCE 10 MG TABS tablet Take 10 mg by mouth daily. 09/07/23   [provider]  metFORMIN (GLUCOPHAGE) 500 MG tablet Take 1 tablet (500 mg total) by mouth 2 (two) times daily with a meal. 05/27/23 09/02/24  Tawkaliyar, Roya, DO  midodrine (PROAMATINE) 5 MG tablet Take 1 tablet (5 mg total) by mouth 3 (three) times daily with meals. 09/17/23 12/16/23  [place], Eric J, DO  Multiple Vitamin (MULTIVITAMIN WITH MINERALS) TABS tablet Take 1 tablet by mouth in the morning. One A Day for Women 50+    [provider]  spironolactone (ALDACTONE) 25 MG tablet Take 1 tablet (25 mg total) by mouth daily. 09/18/23 12/17/23  [place], Eric J, DO  Allergies    Latex, Bactrim [sulfamethoxazole-trimethoprim], Carvedilol, and Metoprolol    Review of Systems   Review of Systems  Constitutional:  Positive for fatigue.  Respiratory:  Positive for cough and shortness of breath.   Cardiovascular:  Positive for leg swelling.  All other systems reviewed and are negative.   Physical Exam Updated Vital Signs BP 137/87   Pulse 94   Temp 98.7 F (37.1 C) (Oral)   Resp 18   Ht 6\' 1"  (1.854 m)   Wt 86.2 kg   SpO2 99%   BMI 25.07 kg/m  Physical Exam Vitals and nursing note reviewed.  Constitutional:      General: She is not in  acute distress.    Appearance: She is well-developed. She is not ill-appearing, toxic-appearing or diaphoretic.  HENT:     Head: Normocephalic and atraumatic.     Mouth/Throat:     Mouth: Mucous membranes are moist.  Eyes:     Conjunctiva/sclera: Conjunctivae normal.  Cardiovascular:     Rate and Rhythm: Normal rate and regular rhythm.  Pulmonary:     Effort: Tachypnea present. No respiratory distress.     Breath sounds: Decreased breath sounds and rales present. No wheezing or rhonchi.  Abdominal:     Palpations: Abdomen is soft.     Tenderness: There is no abdominal tenderness.  Musculoskeletal:        General: No swelling. Normal range of motion.     Cervical back: Normal range of motion and neck supple.     Right lower leg: Edema present.     Left lower leg: Edema present.  Skin:    General: Skin is warm and dry.     Coloration: Skin is not cyanotic or pale.  Neurological:     General: No focal deficit present.     Mental Status: She is alert and oriented to person, place, and time.  Psychiatric:        Mood and Affect: Mood normal.        Behavior: Behavior normal.     ED Results / Procedures / Treatments   Labs (all labs ordered are listed, but only abnormal results are displayed) Labs Reviewed  BRAIN NATRIURETIC PEPTIDE - Abnormal; Notable for the following components:      Result Value   B Natriuretic Peptide 690.8 (*)    All other components within normal limits  CBC WITH DIFFERENTIAL/PLATELET - Abnormal; Notable for the following components:   RBC 3.56 (*)    Hemoglobin 9.9 (*)    HCT 32.2 (*)    RDW 15.6 (*)    All other components within normal limits  TROPONIN I (HIGH SENSITIVITY) - Abnormal; Notable for the following components:   Troponin I (High Sensitivity) 28 (*)    All other components within normal limits  RESP PANEL BY RT-PCR (RSV, FLU A&B, COVID)  [id number]  COMPREHENSIVE METABOLIC PANEL WITH GFR  DIGOXIN LEVEL  TROPONIN I (HIGH SENSITIVITY)     EKG EKG Interpretation Date/Time:  Monday September 30 2023 12:16:08 EDT Ventricular Rate:  96 PR Interval:  107 QRS Duration:  190 QT Interval:  455 QTC Calculation: 576 R Axis:   -83  Text Interpretation: Sinus rhythm VENTRICULAR PACING Confirmed by Gloris Manchester ([id number]) on 09/30/2023 1:17:39 PM  Radiology No results found.  Procedures Procedures    Medications Ordered in ED Medications  oxyCODONE-acetaminophen (PERCOCET/ROXICET) 5-325 MG per tablet 1 tablet (has no administration in time range)  furosemide (LASIX) injection 40 mg (  40 mg Intravenous Given 09/30/23 1345)    ED Course/ Medical Decision Making/ A&P                                 Medical Decision Making Amount and/or Complexity of Data Reviewed Labs: ordered. Radiology: ordered.  Risk Prescription drug management.   This patient presents to the ED for concern of shortness of breath, this involves an extensive number of treatment options, and is a complaint that carries with it a high risk of complications and morbidity.  The differential diagnosis includes CHF exacerbation, pneumonia, PE, URI, active airway disease, anemia, acidosis, other metabolic derangement   Co morbidities that complicate the patient evaluation  HTN, CHF, DM, CVA, GERD, HLD   Additional history obtained:  Additional history obtained from N/A External records from outside source obtained and reviewed including EMR   Lab Tests:  I Ordered, and personally interpreted labs.  The pertinent results include: Baseline anemia, baseline slight elevation in troponin, normal kidney function, normal electrolytes.  BNP is elevated consistent with CHF   Imaging Studies ordered:  I ordered imaging studies including chest x-ray, CTA chest, LLE DVT study I independently visualized and interpreted imaging which showed (pending at time of signout) I agree with the radiologist interpretation   Cardiac Monitoring: / EKG:  The patient was  maintained on a cardiac monitor.  I personally viewed and interpreted the cardiac monitored which showed an underlying rhythm of: Atrial sensed ventricular paced rhythm   Problem List / ED Course / Critical interventions / Medication management  Patient presenting for worsening shortness of breath over the past 2 days.  History of heart failure, currently on Lasix and digoxin.  On exam, patient has mildly increased work of breathing.  She is able to speak in complete sentences.  SpO2 currently normal on room air.  Lower extremity edema is minimal.  Diminished bibasilar lung sounds and crackles present on lung auscultation.  Workup was initiated.  Initial lab work showed normal electrolytes with elevated BNP.  Dose of IV Lasix was ordered.  While in ED, patient endorsed worsened lower leg pain.  Dose of Percocet was ordered.  Will obtain left leg DVT study.  Imaging studies pending at time of signout.  Care of patient was signed out to oncoming ED provider. I ordered medication including Lasix for diuresis; Percocet for analgesia Reevaluation of the patient after these medicines showed that the patient improved I have reviewed the patients home medicines and have made adjustments as needed   Social Determinants of Health:  Has access to outpatient care        Final Clinical Impression(s) / ED Diagnoses Final diagnoses:  SOB (shortness of breath)    Rx / DC Orders ED Discharge Orders     None         Gloris Manchester, MD 09/30/23 1502

## 2023-09-30 NOTE — H&P (Signed)
 History and Physical    Lynn Estrada:[id number] DOB: 1960/12/29 DOA: 09/30/2023  PCP: Porfirio Oar, PA   Chief Complaint: Shortness of breath  HPI: Lynn Estrada is a 63 y.o. female with medical history significant of CHF, COPD, type 2 diabetes, GERD, hypertension, hyperlipidemia, CAD who presented to emergency department due to shortness of breath.  Patient was afebrile hemodynamically stable on presentation.  Respiratory viral panel was ordered which was negative.  Labs were largely unrevealing, BNP 690, hemoglobin 9.9, troponin 28, 32.  Patient had CT pulmonary embolism study which demonstrated right lower lobe segmental PE.  Chest x-ray showed improved airspace disease.  Patient was started on Eliquis admitted for further workup.  Of note patient has had numerous hospitalizations including recent discharge on 3/18.  During this presentation she presented with heart failure and was treated for community acquired pneumonia.   Review of Systems: Review of Systems  Constitutional:  Negative for chills and fever.  HENT: Negative.    Eyes: Negative.   Respiratory:  Positive for shortness of breath.   Cardiovascular: Negative.   Gastrointestinal: Negative.   Genitourinary: Negative.   Musculoskeletal: Negative.   Skin: Negative.   Neurological: Negative.   Endo/Heme/Allergies: Negative.   Psychiatric/Behavioral: Negative.       As per HPI otherwise 10 point review of systems negative.   Allergies  Allergen Reactions   Latex Rash and Hives   Bactrim [Sulfamethoxazole-Trimethoprim] Rash   Carvedilol Itching   Metoprolol Itching and Rash    Past Medical History:  Diagnosis Date   Anemia    Arthritis    CHF (congestive heart failure) (HCC) 12/06/2022   EF is 20% to 25 %   COPD (chronic obstructive pulmonary disease) (HCC)    Diabetes mellitus without complication (HCC)    Eczema    GERD (gastroesophageal reflux disease)    History of adenomatous polyp of colon     08-03-2016  tubular adenoma x2 and hyperplastic   History of chronic gastritis 08/03/2016   History of ectopic pregnancy 1988   s/p  left salpingectomy   History of endometriosis    History of kidney stones    History of partial nephrectomy    right pelvis for very large stone   History of pneumonia 07/02/2016   CAP   History of sepsis    07-01-2016  sepsis w/ pyelonephritis, CAP /   12-31-2016  urosepsis w/ kidney stone obstruction   History of uterine leiomyoma    Hx of adenomatous colonic polyps 08/03/2016   2 adenomas, 1 hpp and 1 lost polyp 2018 all < 1 cm    Hyperlipidemia    Hypertension    Left ureteral stone    Myocardial infarction [facility])    Nephrolithiasis    left obstructive stone and right non-obstructive stone  per CT 12-31-2016   Urgency of urination     Past Surgical History:  Procedure Laterality Date   ABDOMINAL HYSTERECTOMY  01/20/2007   w/ Lysis Adhesions/  Left salpingoophorectomy/  Right Salpingectomy   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   Procedure: BIV ICD INSERTION CRT-D;  Surgeon: Maurice Small, MD;  Location: [facility];  Service: Cardiovascular;  Laterality: N/A;   CHOLECYSTECTOMY  1994   and Right Partial Nephrectomy (pelvis for very large stone)   COLONOSCOPY WITH PROPOFOL N/A 03/26/2023   Procedure: COLONOSCOPY WITH PROPOFOL;  Surgeon: Iva Boop, MD;  Location: [facility];  Service: Gastroenterology;  Laterality: N/A;   CYSTOSCOPY  W/ URETERAL STENT PLACEMENT Left 12/31/2016   Procedure: CYSTOSCOPY WITH RETROGRADE PYELOGRAM/ LEFT URETERAL STENT PLACEMENT;  Surgeon: Sebastian Ache, MD;  Location: [facility];  Service: Urology;  Laterality: Left;   CYSTOSCOPY WITH RETROGRADE PYELOGRAM, URETEROSCOPY AND STENT PLACEMENT Left 01/18/2017   Procedure: 1ST STAGE CYSTOSCOPY WITH RETROGRADE PYELOGRAM, URETEROSCOPY AND STENT REPLACEMENT;  Surgeon: Sebastian Ache, MD;  Location: [facility];  Service: Urology;  Laterality:  Left;   CYSTOSCOPY WITH RETROGRADE PYELOGRAM, URETEROSCOPY AND STENT PLACEMENT Left 02/01/2017   Procedure: 2ND STAGE CYSTOSCOPY WITH RETROGRADE PYELOGRAM, URETEROSCOPY AND STENT REPLACEMENT;  Surgeon: Sebastian Ache, MD;  Location: [facility];  Service: Urology;  Laterality: Left;   ECTOPIC PREGNANCY SURGERY  1988   Left Salpingectomy   HOLMIUM LASER APPLICATION Left 01/18/2017   Procedure: HOLMIUM LASER APPLICATION;  Surgeon: Sebastian Ache, MD;  Location: [facility];  Service: Urology;  Laterality: Left;   HOLMIUM LASER APPLICATION Left 02/01/2017   Procedure: HOLMIUM LASER APPLICATION;  Surgeon: Sebastian Ache, MD;  Location: [facility];  Service: Urology;  Laterality: Left;   LUMBAR DISC SURGERY  01/1999   right L5 -- S1   POLYPECTOMY  03/26/2023   Procedure: POLYPECTOMY;  Surgeon: Iva Boop, MD;  Location: [facility];  Service: Gastroenterology;;   RE-EXPLORATION LUMBAR/  LAMINECTOMY AND MICRODISKECTOMY  10/14/2001   right L5 -- S1   RIGHT HEART CATH N/A 05/23/2022   Procedure: RIGHT HEART CATH;  Surgeon: Dolores Patty, MD;  Location: [facility];  Service: Cardiovascular;  Laterality: N/A;   RIGHT/LEFT HEART CATH AND CORONARY ANGIOGRAPHY N/A 07/18/2021   Procedure: RIGHT/LEFT HEART CATH AND CORONARY ANGIOGRAPHY;  Surgeon: Dolores Patty, MD;  Location: [facility];  Service: Cardiovascular;  Laterality: N/A;   TUBAL LIGATION Bilateral 10/17/1999   PPTL   UMBILICAL HERNIA REPAIR  age 76     reports that she quit smoking about 26 years ago. Her smoking use included cigarettes. She started smoking about 31 years ago. She has a 3.8 pack-year smoking history. She has never used smokeless tobacco. She reports that she does not currently use alcohol. She reports that she does not use drugs.  Family History  Problem Relation Age of Onset   Sarcoidosis Mother    Prostate cancer Father    Hypertension Sister     Eczema Sister    Lupus Sister    Breast cancer Maternal Aunt    Gout Maternal Grandmother    Diabetes Maternal Grandmother        leg amputations   Stomach cancer Neg Hx    Colon cancer Neg Hx    Esophageal cancer Neg Hx    Rectal cancer Neg Hx     Prior to Admission medications   Medication Sig Start Date End Date Taking? Authorizing Provider  acetaminophen (TYLENOL) 500 MG tablet Take 500-1,000 mg by mouth every 6 (six) hours as needed (for pain.).   Yes [provider]  atorvastatin (LIPITOR) 10 MG tablet Take 10 mg by mouth in the morning. 05/15/21  Yes [provider]  benzonatate (TESSALON) 200 MG capsule Take 1 capsule (200 mg total) by mouth 3 (three) times daily as needed for cough (not relieved with tussionex). 09/17/23  Yes [place], Eric J, DO  bisoprolol (ZEBETA) 5 MG tablet Take 0.5 tablets (2.5 mg total) by mouth daily. 07/19/23  Yes Bensimhon, Bevelyn Buckles, MD  Calcium Carbonate Antacid (TUMS E-X PO) Take 1 tablet by mouth  daily.   Yes [provider]  chlorpheniramine-HYDROcodone (TUSSIONEX) 10-8 MG/5ML Take 5 mLs by mouth every 12 (twelve) hours as needed for cough. 09/17/23  Yes [place], Alvira Philips, DO  clopidogrel (PLAVIX) 75 MG tablet Take 1 tablet (75 mg total) by mouth daily. 06/24/23 06/18/24 Yes Butch Penny, NP  digoxin (LANOXIN) 0.125 MG tablet Take 1 tablet (125 mcg total) by mouth daily. 09/03/23  Yes Bensimhon, Bevelyn Buckles, MD  ECOTRIN LOW STRENGTH 81 MG tablet Take 81 mg by mouth daily. Swallow whole.   Yes [provider]  furosemide (LASIX) 20 MG tablet Take 1 tablet (20 mg total) by mouth daily. 08/27/23 08/26/24 Yes Alwyn Ren, MD  JARDIANCE 10 MG TABS tablet Take 10 mg by mouth daily. 09/07/23  Yes [provider]  metFORMIN (GLUCOPHAGE) 500 MG tablet Take 1 tablet (500 mg total) by mouth 2 (two) times daily with a meal. 05/27/23 09/02/24 Yes Tawkaliyar, Roya, DO  midodrine (PROAMATINE) 5 MG tablet Take 1 tablet (5 mg  total) by mouth 3 (three) times daily with meals. 09/17/23 12/16/23 Yes [place], Alvira Philips, DO  Multiple Vitamins-Minerals (ONE-A-DAY WOMENS PO) Take 1 tablet by mouth daily with breakfast.   Yes [provider]  ondansetron (ZOFRAN-ODT) 4 MG disintegrating tablet Take 4 mg by mouth every 8 (eight) hours as needed for nausea or vomiting (dissolve orally).   Yes [provider]  spironolactone (ALDACTONE) 25 MG tablet Take 1 tablet (25 mg total) by mouth daily. 09/18/23 12/17/23 Yes [place], Eric J, DO  guaiFENesin (MUCINEX) 600 MG 12 hr tablet Take 1 tablet (600 mg total) by mouth 2 (two) times daily for 14 days. Patient not taking: Reported on 09/30/2023 09/17/23 10/01/23  [place], Alvira Philips, DO  sacubitril-valsartan (ENTRESTO) 24-26 MG Take 1 tablet by mouth 2 (two) times daily.    [provider]    Physical Exam: Vitals:   09/30/23 1700 09/30/23 1715 09/30/23 1918 09/30/23 1935  BP: (!) 122/94 119/78 117/85 115/88  Pulse:   80 79  Resp: 18 (!) 24 16 18   Temp:    98.2 F (36.8 C)  TempSrc:      SpO2:   99% 99%  Weight:      Height:       Physical Exam Vitals reviewed.  Constitutional:      Appearance: She is normal weight.  HENT:     Head: Normocephalic.     Mouth/Throat:     Mouth: Mucous membranes are moist.  Eyes:     Pupils: Pupils are equal, round, and reactive to light.  Cardiovascular:     Rate and Rhythm: Normal rate and regular rhythm.  Pulmonary:     Effort: Respiratory distress present.     Breath sounds: Normal breath sounds.  Abdominal:     Palpations: Abdomen is soft.  Musculoskeletal:        General: Normal range of motion.     Cervical back: Normal range of motion.  Skin:    General: Skin is warm.     Capillary Refill: Capillary refill takes less than 2 seconds.  Neurological:     General: No focal deficit present.     Mental Status: She is alert.  Psychiatric:        Mood and Affect: Mood normal.        Labs on Admission: I have  personally reviewed the patients's labs and imaging studies.  Assessment/Plan Principal Problem:   Pulmonary emboli (HCC)   # Acute  hypoxic respiratory failure most likely secondary to pulmonary embolism - Patient found to be hypoxic satting in the low 90s - Worsening shortness of breath with exertion - Denies recent immobility other than hospitalization 2 weeks ago - States she is up-to-date on her cancer screening - Endorses 5 pound weight loss over the last several months  Plan: Continue Eliquis Obtain echocardiogram  # Chronic heart failure with reduced ejection fraction, not in exacerbation # Hypertension # ICD - Patient endorsing productive cough - Elevated BNP - Patient is on goal-directed medical therapy at home with beta-blocker and spironolactone.  Patient did not tolerate Entresto and remains on midodrine  # Type 2 diabetes-patient is not on insulin we will continue to monitor.  # Hyperlipidemia-continue atorvastatin  # Chronic anemia-trend hemoglobin  # Elevated troponin-likely related to PE   Admission status: Observation Telemetry  Certification: The appropriate patient status for this patient is OBSERVATION. Observation status is judged to be reasonable and necessary in order to provide the required intensity of service to ensure the patient's safety. The patient's presenting symptoms, physical exam findings, and initial radiographic and laboratory data in the context of their medical condition is felt to place them at decreased risk for further clinical deterioration. Furthermore, it is anticipated that the patient will be medically stable for discharge from the hospital within 2 midnights of admission.     Alan Mulder MD [facility] If 7PM-7AM, please contact night-coverage [email or web address]  09/30/2023, 8:35 PM

## 2023-09-30 NOTE — ED Notes (Signed)
 [employer] interrogated.

## 2023-09-30 NOTE — ED Notes (Signed)
 ED TO INPATIENT HANDOFF REPORT  ED Nurse Name and Phone #:   S Name/Age/Gender Lynn Estrada 63 y.o. female Room/Bed: WA10/WA10  Code Status   Code Status: Full Code  Home/SNF/Other Home Patient oriented to: self, place, time, and situation Is this baseline? Yes   Triage Complete: Triage complete  Chief Complaint Pulmonary emboli [facility]) [I26.99]  Triage Note Patient has been short of breath all weekend. Stating she has congestive heart failure and is compliant with her medication. Feels short of breath at rest. Denies chest pain. Coughing up white mucus. Feet feel swollen.    Allergies Allergies  Allergen Reactions   Latex Rash and Hives   Bactrim [Sulfamethoxazole-Trimethoprim] Rash   Carvedilol Itching   Metoprolol Itching and Rash    Level of Care/Admitting Diagnosis ED Disposition     ED Disposition  Admit   Condition  --   Comment  Hospital Area: [facility] [[id number]]  Level of Care: Telemetry [5]  Admit to tele based on following criteria: Eval of Syncope  May place patient in observation at [facility] or [facility] if equivalent level of care is available:: No  Covid Evaluation: Asymptomatic - no recent exposure (last 10 days) testing not required  Diagnosis: Pulmonary emboli [facility]) [[id number]]  Admitting Physician: Alan Mulder [[id number]]  Attending Physician: Alan Mulder [[id number]]          B Medical/Surgery History Past Medical History:  Diagnosis Date   Anemia    Arthritis    CHF (congestive heart failure) (HCC) 12/06/2022   EF is 20% to 25 %   COPD (chronic obstructive pulmonary disease) (HCC)    Diabetes mellitus without complication (HCC)    Eczema    GERD (gastroesophageal reflux disease)    History of adenomatous polyp of colon    08-03-2016  tubular adenoma x2 and hyperplastic   History of chronic gastritis 08/03/2016   History of ectopic pregnancy 1988   s/p  left salpingectomy   History of  endometriosis    History of kidney stones    History of partial nephrectomy    right pelvis for very large stone   History of pneumonia 07/02/2016   CAP   History of sepsis    07-01-2016  sepsis w/ pyelonephritis, CAP /   12-31-2016  urosepsis w/ kidney stone obstruction   History of uterine leiomyoma    Hx of adenomatous colonic polyps 08/03/2016   2 adenomas, 1 hpp and 1 lost polyp 2018 all < 1 cm    Hyperlipidemia    Hypertension    Left ureteral stone    Myocardial infarction [facility])    Nephrolithiasis    left obstructive stone and right non-obstructive stone  per CT 12-31-2016   Urgency of urination    Past Surgical History:  Procedure Laterality Date   ABDOMINAL HYSTERECTOMY  01/20/2007   w/ Lysis Adhesions/  Left salpingoophorectomy/  Right Salpingectomy   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   Procedure: BIV ICD INSERTION CRT-D;  Surgeon: Maurice Small, MD;  [facility];  Service: Cardiovascular;  Laterality: N/A;   CHOLECYSTECTOMY  1994   and Right Partial Nephrectomy (pelvis for very large stone)   COLONOSCOPY WITH PROPOFOL N/A 03/26/2023   Procedure: COLONOSCOPY WITH PROPOFOL;  Surgeon: Iva Boop, MD;  Location: [facility];  Service: Gastroenterology;  Laterality: N/A;   CYSTOSCOPY W/ URETERAL STENT PLACEMENT Left 12/31/2016   Procedure: CYSTOSCOPY WITH RETROGRADE PYELOGRAM/ LEFT URETERAL STENT PLACEMENT;  Surgeon: Sebastian Ache, MD;  Location: [facility];  Service: Urology;  Laterality: Left;   CYSTOSCOPY WITH RETROGRADE PYELOGRAM, URETEROSCOPY AND STENT PLACEMENT Left 01/18/2017   Procedure: 1ST STAGE CYSTOSCOPY WITH RETROGRADE PYELOGRAM, URETEROSCOPY AND STENT REPLACEMENT;  Surgeon: Sebastian Ache, MD;  Location: [facility];  Service: Urology;  Laterality: Left;   CYSTOSCOPY WITH RETROGRADE PYELOGRAM, URETEROSCOPY AND STENT PLACEMENT Left 02/01/2017   Procedure: 2ND STAGE CYSTOSCOPY WITH RETROGRADE PYELOGRAM, URETEROSCOPY AND  STENT REPLACEMENT;  Surgeon: Sebastian Ache, MD;  Location[facility];  Service: Urology;  Laterality: Left;   ECTOPIC PREGNANCY SURGERY  1988   Left Salpingectomy   HOLMIUM LASER APPLICATION Left 01/18/2017   Procedure: HOLMIUM LASER APPLICATION;  Surgeon: Sebastian Ache, MD;  Location: [facility];  Service: Urology;  Laterality: Left;   HOLMIUM LASER APPLICATION Left 02/01/2017   Procedure: HOLMIUM LASER APPLICATION;  Surgeon: Sebastian Ache, MD;  Location: [facility];  Service: Urology;  Laterality: Left;   LUMBAR DISC SURGERY  01/1999   right L5 -- S1   POLYPECTOMY  03/26/2023   Procedure: POLYPECTOMY;  Surgeon: Iva Boop, MD;  [facility];  Service: Gastroenterology;;   RE-EXPLORATION LUMBAR/  LAMINECTOMY AND MICRODISKECTOMY  10/14/2001   right L5 -- S1   RIGHT HEART CATH N/A 05/23/2022   Procedure: RIGHT HEART CATH;  Surgeon: Dolores Patty, MD;  [facility];  Service: Cardiovascular;  Laterality: N/A;   RIGHT/LEFT HEART CATH AND CORONARY ANGIOGRAPHY N/A 07/18/2021   Procedure: RIGHT/LEFT HEART CATH AND CORONARY ANGIOGRAPHY;  Surgeon: Dolores Patty, MD;  [facility];  Service: Cardiovascular;  Laterality: N/A;   TUBAL LIGATION Bilateral 10/17/1999   PPTL   UMBILICAL HERNIA REPAIR  age 56     A IV Location/Drains/Wounds Patient Lines/Drains/Airways Status     Active Line/Drains/Airways     Name Placement date Placement time Site Days   Peripheral IV 09/13/23 22 G 1.75" Left;Lateral Forearm 09/13/23  1604  Forearm  17   Peripheral IV 09/30/23 20 G 1" Right Antecubital 09/30/23  1356  Antecubital  less than 1   Ureteral Drain/Stent Left ureter 6 Fr. 02/01/17  0916  Left ureter  2432            Intake/Output Last 24 hours No intake or output data in the 24 hours ending 09/30/23 1917  Labs/Imaging Results for orders placed or performed during the hospital encounter  of 09/30/23 (from the past 48 hours)  Resp panel by RT-PCR (RSV, Flu A&B, Covid) Anterior Nasal Swab     Status: None   Collection Time: 09/30/23 12:29 PM   Specimen: Anterior Nasal Swab  Result Value Ref Range   SARS Coronavirus 2 by RT PCR NEGATIVE NEGATIVE    Comment: (NOTE) SARS-CoV-2 target nucleic acids are NOT DETECTED.  The SARS-CoV-2 RNA is generally detectable in upper respiratory specimens during the acute phase of infection. The lowest concentration of SARS-CoV-2 viral copies this assay can detect is 138 copies/mL. A negative result does not preclude SARS-Cov-2 infection and should not be used as the sole basis for treatment or other patient management decisions. A negative result may occur with  improper specimen collection/handling, submission of specimen other than nasopharyngeal swab, presence of viral mutation(s) within the areas targeted by this assay, and inadequate number of viral copies(<138 copies/mL). A negative result must be combined with clinical observations, patient history, and epidemiological information. The expected result is Negative.  Fact Sheet for Patients:  BloggerCourse.com  Fact Sheet for Healthcare Providers:  SeriousBroker.it  This test is no t yet approved or cleared by the [place] FDA and  has been authorized for detection and/or diagnosis of SARS-CoV-2 by FDA under an Emergency Use Authorization (EUA). This EUA will remain  in effect (meaning this test can be used) for the duration of the COVID-19 declaration under Section 564(b)(1) of the Act, 21 U.S.C.section 360bbb-3(b)(1), unless the authorization is terminated  or revoked sooner.       Influenza A by PCR NEGATIVE NEGATIVE   Influenza B by PCR NEGATIVE NEGATIVE    Comment: (NOTE) The Xpert Xpress SARS-CoV-2/FLU/RSV plus assay is intended as an aid in the diagnosis of influenza from Nasopharyngeal swab specimens and should  not be used as a sole basis for treatment. Nasal washings and aspirates are unacceptable for Xpert Xpress SARS-CoV-2/FLU/RSV testing.  Fact Sheet for Patients: BloggerCourse.com  Fact Sheet for Healthcare Providers: SeriousBroker.it  This test is not yet approved or cleared by the [place] FDA and has been authorized for detection and/or diagnosis of SARS-CoV-2 by FDA under an Emergency Use Authorization (EUA). This EUA will remain in effect (meaning this test can be used) for the duration of the COVID-19 declaration under Section 564(b)(1) of the Act, 21 U.S.C. section 360bbb-3(b)(1), unless the authorization is terminated or revoked.     Resp Syncytial Virus by PCR NEGATIVE NEGATIVE    Comment: (NOTE) Fact Sheet for Patients: BloggerCourse.com  Fact Sheet for Healthcare Providers: SeriousBroker.it  This test is not yet approved or cleared by the [place] FDA and has been authorized for detection and/or diagnosis of SARS-CoV-2 by FDA under an Emergency Use Authorization (EUA). This EUA will remain in effect (meaning this test can be used) for the duration of the COVID-19 declaration under Section 564(b)(1) of the Act, 21 U.S.C. section 360bbb-3(b)(1), unless the authorization is terminated or revoked.  Performed at [facility], [address] [place], [place] [address]   Comprehensive metabolic panel     Status: None   Collection Time: 09/30/23 12:29 PM  Result Value Ref Range   Sodium 139 135 - 145 mmol/L   Potassium 4.0 3.5 - 5.1 mmol/L   Chloride 107 98 - 111 mmol/L   CO2 22 22 - 32 mmol/L   Glucose, Bld 96 70 - 99 mg/dL    Comment: Glucose reference range applies only to samples taken after fasting for at least 8 hours.   BUN 19 8 - 23 mg/dL   Creatinine, Ser 3.08 0.44 - 1.00 mg/dL   Calcium 9.1 8.9 - 65.7 mg/dL   Total Protein 7.2 6.5  - 8.1 g/dL   Albumin 3.9 3.5 - 5.0 g/dL   AST 23 15 - 41 U/L   ALT 27 0 - 44 U/L   Alkaline Phosphatase 55 38 - 126 U/L   Total Bilirubin 0.6 0.0 - 1.2 mg/dL   GFR, Estimated >84 >69 mL/min    Comment: (NOTE) Calculated using the CKD-EPI Creatinine Equation (2021)    Anion gap 10 5 - 15    Comment: Performed at [facility], [address] [place], [place] [address]  Brain natriuretic peptide     Status: Abnormal   Collection Time: 09/30/23 12:29 PM  Result Value Ref Range   B Natriuretic Peptide 690.8 (H) 0.0 - 100.0 pg/mL    Comment: Performed at [facility], [address], [place], [place] [address]  CBC with Differential     Status: Abnormal   Collection Time: 09/30/23 12:29 PM  Result Value Ref Range   WBC 5.7 4.0 - 10.5 K/uL   RBC 3.56 (L) 3.87 - 5.11 MIL/uL   Hemoglobin 9.9 (L) 12.0 - 15.0 g/dL   HCT 86.5 (L) 78.4 - 69.6 %   MCV 90.4 80.0 - 100.0 fL   MCH 27.8 26.0 - 34.0 pg   MCHC 30.7 30.0 - 36.0 g/dL   RDW 29.5 (H) 28.4 - 13.2 %   Platelets 263 150 - 400 K/uL   nRBC 0.0 0.0 - 0.2 %   Neutrophils Relative % 73 %   Neutro Abs 4.2 1.7 - 7.7 K/uL   Lymphocytes Relative 17 %   Lymphs Abs 1.0 0.7 - 4.0 K/uL   Monocytes Relative 9 %   Monocytes Absolute 0.5 0.1 - 1.0 K/uL   Eosinophils Relative 1 %   Eosinophils Absolute 0.0 0.0 - 0.5 K/uL   Basophils Relative 0 %   Basophils Absolute 0.0 0.0 - 0.1 K/uL   Immature Granulocytes 0 %   Abs Immature Granulocytes 0.02 0.00 - 0.07 K/uL    Comment: Performed at [facility], [address] [place], [place] [address]  Troponin I (High Sensitivity)     Status: Abnormal   Collection Time: 09/30/23 12:29 PM  Result Value Ref Range   Troponin I (High Sensitivity) 28 (H) <18 ng/L    Comment: (NOTE) Elevated high sensitivity troponin I (hsTnI) values and significant  changes across serial measurements may suggest ACS but many other  chronic and acute conditions are  known to elevate hsTnI results.  Refer to the "Links" section for chest pain algorithms and additional  guidance. Performed at [facility], [address], [place], [place] [address]   Troponin I (High Sensitivity)     Status: Abnormal   Collection Time: 09/30/23  2:35 PM  Result Value Ref Range   Troponin I (High Sensitivity) 32 (H) <18 ng/L    Comment: (NOTE) Elevated high sensitivity troponin I (hsTnI) values and significant  changes across serial measurements may suggest ACS but many other  chronic and acute conditions are known to elevate hsTnI results.  Refer to the "Links" section for chest pain algorithms and additional  guidance. Performed at [facility], [address], [place], [place] [address]    VAS [place] LOWER EXTREMITY VENOUS (DVT) (ONLY [facility]) Result Date: 09/30/2023  Lower Venous DVT Study Patient Name:  Lynn Estrada  Date of Exam:   09/30/2023 Medical Rec #: [id number]          Accession #:    [id number] Date of Birth: Apr 27, 1961         Patient Gender: F Patient Age:   88 years Exam Location:  [facility] Procedure:      VAS [place] LOWER EXTREMITY VENOUS (DVT) Referring Phys: Gloris Manchester --------------------------------------------------------------------------------  Indications: Pain.  Risk Factors: Past pregnancy. Limitations: Poor ultrasound/tissue interface. Comparison Study: None. Performing Technologist: Shona Simpson  Examination Guidelines: A complete evaluation includes B-mode imaging, spectral Doppler, color Doppler, and power Doppler as needed of all accessible portions of each vessel. Bilateral testing is considered an integral part of a complete examination. Limited examinations for reoccurring indications may be performed as noted. The reflux portion of the exam is performed with the patient in reverse Trendelenburg.  +-----+---------------+---------+-----------+----------+--------------+  RIGHTCompressibilityPhasicitySpontaneityPropertiesThrombus Aging +-----+---------------+---------+-----------+----------+--------------+ CFV  Full  Yes      Yes                                 +-----+---------------+---------+-----------+----------+--------------+   +--------+---------------+---------+-----------+----------------+-------------+ LEFT    CompressibilityPhasicitySpontaneityProperties      Thrombus                                                                 Aging         +--------+---------------+---------+-----------+----------------+-------------+ CFV     Full           Yes      Yes                                      +--------+---------------+---------+-----------+----------------+-------------+ SFJ     Full                                                             +--------+---------------+---------+-----------+----------------+-------------+ FV Prox Full                                                             +--------+---------------+---------+-----------+----------------+-------------+ FV Mid  Full                                                             +--------+---------------+---------+-----------+----------------+-------------+ FV      Full                    Yes                                      Distal                                                                   +--------+---------------+---------+-----------+----------------+-------------+ PFV     Full                                                             +--------+---------------+---------+-----------+----------------+-------------+ POP     Full  Yes      Yes                                      +--------+---------------+---------+-----------+----------------+-------------+ PTV     Full                                                              +--------+---------------+---------+-----------+----------------+-------------+ PERO    Full                                                             +--------+---------------+---------+-----------+----------------+-------------+ SSV     None           No       No         brightly        Acute                                                    echogenic                     +--------+---------------+---------+-----------+----------------+-------------+     Summary: RIGHT: - No evidence of common femoral vein obstruction.   LEFT: - Findings consistent with acute superficial vein thrombosis involving the left small saphenous vein.  - No cystic structure found in the popliteal fossa.  *See table(s) above for measurements and observations. Electronically signed by Sherald Hess MD on 09/30/2023 at 5:42:49 PM.    Final    CT Angio Chest PE W and/or Wo Contrast Result Date: 09/30/2023 CLINICAL DATA:  Productive cough, lower extremity edema, short of breath for 2 days EXAM: CT ANGIOGRAPHY CHEST WITH CONTRAST TECHNIQUE: Multidetector CT imaging of the chest was performed using the standard protocol during bolus administration of intravenous contrast. Multiplanar CT image reconstructions and MIPs were obtained to evaluate the vascular anatomy. RADIATION DOSE REDUCTION: This exam was performed according to the departmental dose-optimization program which includes automated exposure control, adjustment of the mA and/or kV according to patient size and/or use of iterative reconstruction technique. CONTRAST:  75mL OMNIPAQUE IOHEXOL 350 MG/ML SOLN COMPARISON:  09/12/2023, 09/30/2023 FINDINGS: Cardiovascular: This is a technically adequate evaluation of the pulmonary vasculature. There are segmental right lower lobe pulmonary emboli identified, new since prior study. Clot burden is minimal. No evidence of right heart strain. Stable cardiomegaly with prominent left ventricular dilatation. Multi lead  pacemaker unchanged. Normal caliber of the thoracic aorta. Stable atherosclerosis of the aorta and coronary vasculature. Mediastinum/Nodes: Stable borderline enlarged mediastinal and hilar lymph nodes, likely reactive. Thyroid, trachea, and esophagus are unremarkable. Lungs/Pleura: Since the prior CT, there has been significant improvement in the bilateral patchy airspace disease, with residual faint bilateral ground-glass airspace disease remaining most pronounced in the right lower lobe. Linear areas of consolidation at the left lung base likely reflect subsegmental atelectasis or scarring, and are stable. No effusion or pneumothorax.  The central airways are patent. Upper Abdomen: No acute abnormality. Musculoskeletal: No acute or destructive bony abnormalities. Reconstructed images demonstrate no additional findings. Review of the MIP images confirms the above findings. IMPRESSION: 1. Right lower lobe segmental pulmonary emboli, new since prior study. Minimal clot burden with no evidence of right heart strain. 2. Marked improvement in the bilateral airspace disease seen previously, with residual faint ground-glass opacities most pronounced in the right lower lobe compatible with resolving pneumonia. 3. Stable borderline enlarged mediastinal and hilar lymph nodes, consistent with reactive adenopathy. 4. Stable cardiomegaly. 5. Aortic Atherosclerosis (ICD10-I70.0). Coronary artery atherosclerosis. Critical Value/emergent results were called by telephone at the time of interpretation on 09/30/2023 at 4:59 p.m. to provider Gloris Manchester , who verbally acknowledged these results. Electronically Signed   By: Sharlet Salina M.D.   On: 09/30/2023 17:02   DG Chest 2 View Result Date: 09/30/2023 CLINICAL DATA:  Shortness of breath. EXAM: CHEST - 2 VIEW COMPARISON:  Chest radiograph dated 09/12/2023. FINDINGS: Stable cardiomegaly. Stable left chest wall cardiac device in place. Aortic atherosclerosis. Improved bilateral  multifocal airspace disease with residual airspace opacities in the right mid and lower lung zones and lingula. No pleural effusion or pneumothorax. No acute osseous abnormality. IMPRESSION: Improved bilateral multifocal airspace disease, with residual airspace opacities in the right mid and lower lung zones and lingula. These findings could reflect pulmonary edema or pneumonia. Electronically Signed   By: Hart Robinsons M.D.   On: 09/30/2023 15:23    Pending Labs Unresulted Labs (From admission, onward)     Start     Ordered   09/30/23 1300  Digoxin level  Once,   STAT        09/30/23 1300            Vitals/Pain Today's Vitals   09/30/23 1635 09/30/23 1645 09/30/23 1700 09/30/23 1715  BP: (!) 124/101 127/86 (!) 122/94 119/78  Pulse: 94     Resp: (!) 23 (!) 21 18 (!) 24  Temp: 98.9 F (37.2 C)     TempSrc: Oral     SpO2: 99%     Weight:      Height:      PainSc:        Isolation Precautions No active isolations  Medications Medications  apixaban (ELIQUIS) tablet 10 mg (has no administration in time range)    Followed by  apixaban (ELIQUIS) tablet 5 mg (has no administration in time range)  atorvastatin (LIPITOR) tablet 10 mg (has no administration in time range)  bisoprolol (ZEBETA) tablet 2.5 mg (has no administration in time range)  digoxin (LANOXIN) tablet 125 mcg (has no administration in time range)  furosemide (LASIX) tablet 20 mg (has no administration in time range)  midodrine (PROAMATINE) tablet 5 mg (has no administration in time range)  spironolactone (ALDACTONE) tablet 25 mg (has no administration in time range)  clopidogrel (PLAVIX) tablet 75 mg (has no administration in time range)  acetaminophen (TYLENOL) tablet 650 mg (has no administration in time range)    Or  acetaminophen (TYLENOL) suppository 650 mg (has no administration in time range)  ondansetron (ZOFRAN) tablet 4 mg (has no administration in time range)    Or  ondansetron (ZOFRAN)  injection 4 mg (has no administration in time range)  furosemide (LASIX) injection 40 mg (40 mg Intravenous Given 09/30/23 1345)  oxyCODONE-acetaminophen (PERCOCET/ROXICET) 5-325 MG per tablet 1 tablet (1 tablet Oral Given 09/30/23 1511)  iohexol (OMNIPAQUE) 350 MG/ML injection 75 mL (75 mLs Intravenous  Contrast Given 09/30/23 1620)    Mobility walks     Focused Assessments    R Recommendations: See Admitting Provider Note  Report given to:   Additional Notes:

## 2023-09-30 NOTE — ED Notes (Signed)
 Ambulated from room to the end of the hall and back to room start 100% end 94%

## 2023-09-30 NOTE — ED Triage Notes (Addendum)
 Patient has been short of breath all weekend. Stating she has congestive heart failure and is compliant with her medication. Feels short of breath at rest. Denies chest pain. Coughing up white mucus. Feet feel swollen.

## 2023-09-30 NOTE — ED Provider Notes (Addendum)
  Physical Exam  BP (!) 124/101   Pulse 94   Temp 98.9 F (37.2 C) (Oral)   Resp (!) 23   Ht 6\' 1"  (1.854 m)   Wt 86.2 kg   SpO2 99%   BMI 25.07 kg/m   Physical Exam  Procedures  .Critical Care  Performed by: Arletha Pili, DO Authorized by: Arletha Pili, DO   Critical care provider statement:    Critical care time (minutes):  30   Critical care was necessary to treat or prevent imminent or life-threatening deterioration of the following conditions:  Respiratory failure   Critical care was time spent personally by me on the following activities:  Development of treatment plan with patient or surrogate, discussions with consultants, evaluation of patient's response to treatment, examination of patient, ordering and review of laboratory studies, ordering and review of radiographic studies, ordering and performing treatments and interventions, pulse oximetry, re-evaluation of patient's condition and review of old charts Comments:     Pulmonary embolism   ED Course / MDM    Medical Decision Making Amount and/or Complexity of Data Reviewed Labs: ordered. Radiology: ordered.  Risk Prescription drug management. Decision regarding hospitalization.   Wardell Honour, assumed care for this patient.  In brief this is a 63 year old female here today with shortness of breath.  Patient was signed out pending CTA.  CTA did show segmental PE.  No right heart strain.  Ambulated the patient, and fortunately O2 saturations dropped to 90, 91% using a walker.  With her medical comorbidities, I believe it is prudent to start the patient on anticoagulation, optimize her volume status prior to discharge.  Admitted to hospitalist.  Started on Eliquis.       Anders Simmonds T, DO 09/30/23 1741    Anders Simmonds T, DO 09/30/23 1742

## 2023-09-30 NOTE — Progress Notes (Addendum)
 Lower extremity venous duplex completed. Please see CV Procedures for preliminary results.  Initial results reported to Army [place], Charity fundraiser.  Shona Simpson, RVT 09/30/23 4:25 PM

## 2023-10-01 ENCOUNTER — Other Ambulatory Visit (HOSPITAL_COMMUNITY): Payer: Self-pay

## 2023-10-01 ENCOUNTER — Observation Stay (HOSPITAL_BASED_OUTPATIENT_CLINIC_OR_DEPARTMENT_OTHER)

## 2023-10-01 ENCOUNTER — Telehealth (HOSPITAL_COMMUNITY): Payer: Self-pay | Admitting: Pharmacy Technician

## 2023-10-01 ENCOUNTER — Ambulatory Visit: Admitting: Physical Therapy

## 2023-10-01 DIAGNOSIS — I5041 Acute combined systolic (congestive) and diastolic (congestive) heart failure: Secondary | ICD-10-CM

## 2023-10-01 DIAGNOSIS — I2699 Other pulmonary embolism without acute cor pulmonale: Principal | ICD-10-CM

## 2023-10-01 DIAGNOSIS — Z86711 Personal history of pulmonary embolism: Secondary | ICD-10-CM

## 2023-10-01 DIAGNOSIS — I2510 Atherosclerotic heart disease of native coronary artery without angina pectoris: Secondary | ICD-10-CM | POA: Insufficient documentation

## 2023-10-01 DIAGNOSIS — E1165 Type 2 diabetes mellitus with hyperglycemia: Secondary | ICD-10-CM | POA: Diagnosis not present

## 2023-10-01 DIAGNOSIS — I700 Atherosclerosis of aorta: Secondary | ICD-10-CM

## 2023-10-01 MED ORDER — FUROSEMIDE 10 MG/ML IJ SOLN
40.0000 mg | Freq: Once | INTRAMUSCULAR | Status: AC
  Administered 2023-10-01: 40 mg via INTRAVENOUS
  Filled 2023-10-01: qty 4

## 2023-10-01 MED ORDER — EMPAGLIFLOZIN 10 MG PO TABS
10.0000 mg | ORAL_TABLET | Freq: Every day | ORAL | Status: DC
Start: 2023-10-02 — End: 2023-10-02
  Administered 2023-10-02: 10 mg via ORAL
  Filled 2023-10-01: qty 1

## 2023-10-01 MED ORDER — EMPAGLIFLOZIN 10 MG PO TABS: 10.0000 mg | ORAL_TABLET | Freq: Every day | ORAL | Status: DC

## 2023-10-01 NOTE — Hospital Course (Addendum)
 63 year old woman PMH including systolic CHF, left bundle branch block, CRT defibrillator, recently hospitalized in February for acute on chronic CHF, pneumonia.  Hospitalized again 3/13 to 3/18 for pneumonia, hypoxia, and medications were adjusted by cardiology given hypotension.  Presented 3/31 with shortness of breath.  Admitted for acute PE, acute on chronic CHF.  Consultants Cardiology 4/2  Procedures/Events None

## 2023-10-01 NOTE — Telephone Encounter (Signed)
Patient Product/process development scientist completed.    The patient is insured through [employer].     Ran test claim for Eliquis 5 mg and the current 30 day co-pay is $4.00.   This test claim was processed through [facility]- copay amounts may vary at other pharmacies due to pharmacy/plan contracts, or as the patient moves through the different stages of their insurance plan.     Roland Earl, CPHT Pharmacy Technician III Certified Patient Advocate [facility] Patient Advocate Team Direct Number: [phone]  Fax: [phone]

## 2023-10-01 NOTE — Discharge Instructions (Signed)
 Information on my medicine - ELIQUIS (apixaban)  This medication education was reviewed with me or my healthcare representative as part of my discharge preparation.  The pharmacist that spoke with me during my hospital stay was:  Tory Emerald, Student-PharmD  Why was Eliquis prescribed for you? Eliquis was prescribed to treat blood clots that may have been found in the veins of your legs (deep vein thrombosis) or in your lungs (pulmonary embolism) and to reduce the risk of them occurring again.  What do You need to know about Eliquis ? The starting dose is 10 mg (two 5 mg tablets) taken TWICE daily for the FIRST SEVEN (7) DAYS, then on (enter date)  10/07/2023  the dose is reduced to ONE 5 mg tablet taken TWICE daily.  Eliquis may be taken with or without food.   Try to take the dose about the same time in the morning and in the evening. If you have difficulty swallowing the tablet whole please discuss with your pharmacist how to take the medication safely.  Take Eliquis exactly as prescribed and DO NOT stop taking Eliquis without talking to the doctor who prescribed the medication.  Stopping may increase your risk of developing a new blood clot.  Refill your prescription before you run out.  After discharge, you should have regular check-up appointments with your healthcare provider that is prescribing your Eliquis.    What do you do if you miss a dose? If a dose of ELIQUIS is not taken at the scheduled time, take it as soon as possible on the same day and twice-daily administration should be resumed. The dose should not be doubled to make up for a missed dose.  Important Safety Information A possible side effect of Eliquis is bleeding. You should call your healthcare provider right away if you experience any of the following: Bleeding from an injury or your nose that does not stop. Unusual colored urine (red or dark brown) or unusual colored stools (red or black). Unusual bruising  for unknown reasons. A serious fall or if you hit your head (even if there is no bleeding).  Some medicines may interact with Eliquis and might increase your risk of bleeding or clotting while on Eliquis. To help avoid this, consult your healthcare provider or pharmacist prior to using any new prescription or non-prescription medications, including herbals, vitamins, non-steroidal anti-inflammatory drugs (NSAIDs) and supplements.  This website has more information on Eliquis (apixaban): http://www.eliquis.com/eliquis/home

## 2023-10-01 NOTE — Plan of Care (Signed)
 Pt ambulated 163ft on room air, spo2 100% without desat

## 2023-10-01 NOTE — TOC Initial Note (Signed)
 Transition of Care [facility]) - Initial/Assessment Note    Patient Details  Name: Lynn Estrada MRN: [id number] Date of Birth: 1960-07-17  [facility] CM/SW Contact:    Larrie Kass, LCSW Phone Number: 10/01/2023, 9:04 AM  Clinical Narrative:                 TOC CSW received consult for medication assistance . Pt does not qualify for [facility] program, as pt is Insured with [place] [employer]. TOC to follow.   Expected Discharge Plan: Home/Self Care Barriers to Discharge: Continued Medical Work up   Patient Goals and CMS Choice            Expected Discharge Plan and Services                                              Prior Living Arrangements/Services                       Activities of Daily Living   ADL Screening (condition at time of admission) Independently performs ADLs?: Yes (appropriate for developmental age) Does the patient have a NEW difficulty with bathing/dressing/toileting/self-feeding that is expected to last >3 days?: Yes (Initiates electronic notice to provider for possible OT consult) Does the patient have a NEW difficulty with getting in/out of bed, walking, or climbing stairs that is expected to last >3 days?: Yes (Initiates electronic notice to provider for possible PT consult) Does the patient have a NEW difficulty with communication that is expected to last >3 days?: No Is the patient deaf or have difficulty hearing?: No Does the patient have difficulty seeing, even when wearing glasses/contacts?: No Does the patient have difficulty concentrating, remembering, or making decisions?: No  Permission Sought/Granted                  Emotional Assessment              Admission diagnosis:  SOB (shortness of breath) [R06.02] Pulmonary emboli (HCC) [I26.99] Other acute pulmonary embolism without acute cor pulmonale (HCC) [I26.99] Patient Active Problem List   Diagnosis Date Noted   Pulmonary emboli (HCC)  09/30/2023   HCAP (healthcare-associated pneumonia) 09/12/2023   Acute on chronic combined systolic and diastolic heart failure (HCC) 08/23/2023   Frequency of urination and polyuria 08/23/2023   Cerebrovascular accident (CVA) (HCC) 05/26/2023   Pyuria 05/25/2023   Left-sided weakness 05/24/2023   Epistaxis 04/22/2023   Benign neoplasm of cecum 03/26/2023   Benign neoplasm of descending colon 03/26/2023   Benign neoplasm of sigmoid colon 03/26/2023   Adhesive capsulitis of right shoulder 08/24/2022   Mass of right hand 02/22/2022   LBBB (left bundle branch block) 02/01/2022   Type 2 diabetes mellitus with hyperglycemia, without long-term current use of insulin (HCC) 01/22/2022   Hypertriglyceridemia 01/21/2022   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 10/26/2021   Chronic combined systolic and diastolic heart failure (HCC) 07/27/2021   Acute combined systolic (congestive) and diastolic (congestive) heart failure (HCC)    Hypocalcemia 07/12/2021   Elevated troponin 07/12/2021   Lipoma of hand 07/20/2020   Osteoarthritis of carpometacarpal (CMC) joint of thumb 07/20/2020   Pain in right hand 07/20/2020   Radial styloid tenosynovitis 07/20/2020   Other osteoarthritis of spine 10/23/2019   Suspected COVID-19 virus infection 06/11/2019   Acute respiratory failure with hypoxia (  HCC) 06/11/2019   Multifocal pneumonia 06/11/2019   Carpal tunnel syndrome, bilateral 04/24/2019   Ureteral stone with hydronephrosis 12/31/2016   Hx of adenomatous colonic polyps 08/03/2016   Normocytic anemia 07/10/2016   CAP (community acquired pneumonia) 07/06/2016   Essential hypertension 07/26/2015   Eczema 07/26/2015   Overweight (BMI 25.0-29.9) 07/26/2015   PCP:  Porfirio Oar, PA Pharmacy:   [facility] [phone] [place], [place] - [address] [place] [address] Phone: [phone] Fax: [phone]     Social Drivers of Health  (SDOH) Social History: SDOH Screenings   Food Insecurity: No Food Insecurity (09/30/2023)  Housing: Low Risk  (09/30/2023)  Transportation Needs: No Transportation Needs (09/30/2023)  Utilities: Not At Risk (09/30/2023)  Depression (PHQ2-9): Low Risk  (03/16/2022)  Financial Resource Strain: Low Risk  (09/24/2023)   Received from [facility]  Physical Activity: Unknown (09/24/2023)   Received from [facility]  Social Connections: Patient Declined (09/30/2023)  Stress: No Stress Concern Present (09/24/2023)   Received from [facility]  Tobacco Use: Medium Risk (09/30/2023)   SDOH Interventions:     Readmission Risk Interventions     No data to display

## 2023-10-01 NOTE — Progress Notes (Addendum)
  Progress Note   Patient: Lynn Estrada:[id number] DOB: [birth year] DOA: 09/30/2023     0 DOS: the patient was seen and examined on 10/01/2023   Brief hospital course: 63 year old woman PMH including systolic CHF, left bundle branch block, CRT defibrillator, recently hospitalized in February for acute on chronic CHF, pneumonia.  Hospitalized again 3/13 to 3/18 for pneumonia, hypoxia, and medications were adjusted by cardiology given hypotension.  Presented 3/31 with shortness of breath.  Admitted for acute PE, acute on chronic CHF.  Consultants Cardiology 4/2  Procedures/Events None   Assessment and Plan: Acute pulmonary embolism  Acute hypoxic respiratory failure ruled out Elevated troponin, trivial, secondary to above Superficial vein thrombosis left saphenous There is no documentation of hypoxia or respiratory distress.  Sats in the 90s. CT right lower lobe segmental pulmonary emboli, minimal clot burden, no evidence right heart strain Continue apixaban No echocardiogram was ordered on admission.  She just had 1 in February and given her low clot burden I do not think a repeat is indicated at this time.   Acute on chronic combined systolic and diastolic CHF Status post BiV PPM BNP elevated.  Responded well to IV Lasix yesterday. Continue digoxin, atorvastatin, spironolactone, Jardiance.  Will give 1 more IV dose of Lasix today and resume oral tomorrow.  Should be able to resume Entresto and [employer].  Note however patient not sure whether she is taking Entresto at home or not.   Type 2 diabetes On metformin prior to admission, would consider discontinuing this medication on discharge. Continue Jardiance.   Hyperlipidemia-continue atorvastatin Chronic normocytic anemia-stable Aortic atherosclerosis-continue statin Coronary artery atherosclerosis    Subjective:  Still feels short of breath, especially with ambulation.  Used to walk several miles a day but since  she has been over the last couple months has been unable to do so.  Gets fatigued quite easily.  Physical Exam: Vitals:   10/01/23 0029 10/01/23 0419 10/01/23 0754 10/01/23 1436  BP: 108/79 105/72 113/80 97/62  Pulse: 88 90 62 75  Resp: 18 17 18 20   Temp: 98.8 F (37.1 C) 98.9 F (37.2 C) 97.7 F (36.5 C) 98.1 F (36.7 C)  TempSrc:   Oral Oral  SpO2: 100% 98% 100% 94%  Weight:      Height:       Physical Exam Vitals reviewed.  Constitutional:      General: She is not in acute distress.    Appearance: She is not ill-appearing or toxic-appearing.  Cardiovascular:     Rate and Rhythm: Normal rate and regular rhythm.     Heart sounds: No murmur heard.    Comments: Paced rhythm Pulmonary:     Effort: Pulmonary effort is normal. No respiratory distress.     Breath sounds: No wheezing, rhonchi or rales.  Neurological:     Mental Status: She is alert.  Psychiatric:        Mood and Affect: Mood normal.        Behavior: Behavior normal.     Data Reviewed: Complete metabolic panel unremarkable BNP 690 Troponins flat 28, 32 Hemoglobin stable compared to previous, 9.9  Serum digoxin 0.6 Influenza, RSV and COVID-negative  Family Communication: none  Disposition: Status is: Observation      Time spent: 20 minutes  Author: Brendia Sacks, MD 10/01/2023 6:07 PM  For on call review [email or web address].

## 2023-10-01 NOTE — Progress Notes (Signed)
 RLE venous duplex has been completed.   Results can be found under chart review under CV PROC. 10/01/2023 2:05 PM Myrene Bougher RVT, RDMS

## 2023-10-01 NOTE — Consult Note (Incomplete)
 Cardiology Consultation   Patient ID: Lynn Estrada MRN: [id number]; DOB: [birth year]  Admit date: 09/30/2023 Date of Consult: 10/02/2023  PCP:  Porfirio Oar, PA   [facility] Providers Cardiologist:  Arvilla Meres, MD  Electrophysiologist:  Maurice Small, MD       Patient Profile:   Lynn Estrada is a 63 y.o. female with a history of minimal non-obstructive CAD on cardiac catheterization in 07/2021, non-ischemic cardiomyopathy/ chronic combined CHF with EF of 20-25% on Echo in 05/2023 s/p CRT-D, LBBB, CVA in 05/2023, COPD, hypertension, hyperlipidemia, type 2 diabetes mellitus, GERD, chronic anemia, and s/p partial right nephrectomy who is being seen 10/02/2023 for the evaluation of acute on chronic CHF at the request of Dr. Irene Limbo.  History of Present Illness:   Lynn Estrada is a 63 year old female with the above history who is followed by Dr. Gala Romney. Patient was admitted in 07/2021 for new onset CHF. Echo showed LVEF of 20-25% with grade 2 diastolic dysfunction and septal-lateral dyssynchrony. R/LHC showed minimal non-obstructive CAD, mildly elevated filling pressures, and normal cardiac output. Cardiac MRI showed possible myocarditis (met [facility] Criteria for this but LGE and parametric mapping were not classic for this). He underwent placement of Abbott CRT-D in 12/2022 with significant QRS narrowing.   She has had multiple admission recently. She was admitted in 08/2023 for pneumonia and UTI. Echo showed LVEF of 20-25% with global hypokinesis and grade 2 diastolic dysfunction, normal RV function, and no significant valvular disease. [name] and Graham were held due to soft BP and UTI. [name] was subsequently restarted at hospital follow-up. She was readmitted from 09/12/2023 to 09/17/2023 for acute hypoxic respiratory failure secondary to community acquired pneumonia after presenting with progressive shortness of breath, cough, fever, and body aches. She  was treated with antibiotics. [name] was again stopped due to hypotension and she was started on Midodrine.   Patient presented back to the ED on 09/30/2023 for further evaluation of shortness of breath. Upon arrival to the ED, EKG showed ventricular paced rhythm. High-sensitivity troponin minimally elevated and flat at 28 >> 32. BNP elevated at 690.8. Chest x-ray showed improved bilateral multifocal airspace disease with residual airspace opacities in the right mid and lower lung zones and lingula. Chest CTA showed right lower lobe segmental PE. WBC 5.7, Hgb 9.9, Plts 263. Na139, K 4.0, Glucose 96, BUN 19, Cr 0.91. LFTs normal. Digoxin level 0.6. Patient was admitted for acute hypoxic respiratory failure felt to be secondary to PE and CHF.  She was started on Eliquis and IV Lasix. Cardiology consulted for further evaluation.   At the time of this evaluation, patient is resting comfortably in no acute distress. She has chronic dyspnea on exertion but states she noticed significant worsening of her breathing around 09/24/2023 (1 week after most recent discharge). She describes significant dyspnea even at rest and states she did not sleep for 48 hours due to breathing issues. She called our office over the weekend on 09/28/2023 requesting supplemental O2 but it was explained to her that we could not due this over the phone. She also reports some lower extremity lower extremity edema mostly in her feet and ankles for the past couple of days prior to admission. She denies any chest pain. She describes some heart racing when she is acutely short of breath but nothing outside these times. No lightheadedness, dizziness, or syncope. She has a residual cough from her recent pneumonia. However, no fevers at home. No nausea,  vomiting, or diarrhea. No abnormal bleeding in urine or stools.   Past Medical History:  Diagnosis Date   Anemia    Arthritis    CHF (congestive heart failure) (HCC) 12/06/2022   EF is 20% to 25  %   COPD (chronic obstructive pulmonary disease) (HCC)    Diabetes mellitus without complication (HCC)    Eczema    GERD (gastroesophageal reflux disease)    History of adenomatous polyp of colon    08-03-2016  tubular adenoma x2 and hyperplastic   History of chronic gastritis 08/03/2016   History of ectopic pregnancy 1988   s/p  left salpingectomy   History of endometriosis    History of kidney stones    History of partial nephrectomy    right pelvis for very large stone   History of pneumonia 07/02/2016   CAP   History of sepsis    07-01-2016  sepsis w/ pyelonephritis, CAP /   12-31-2016  urosepsis w/ kidney stone obstruction   History of uterine leiomyoma    Hx of adenomatous colonic polyps 08/03/2016   2 adenomas, 1 hpp and 1 lost polyp 2018 all < 1 cm    Hyperlipidemia    Hypertension    Left ureteral stone    Myocardial infarction [facility])    Nephrolithiasis    left obstructive stone and right non-obstructive stone  per CT 12-31-2016   Urgency of urination     Past Surgical History:  Procedure Laterality Date   ABDOMINAL HYSTERECTOMY  01/20/2007   w/ Lysis Adhesions/  Left salpingoophorectomy/  Right Salpingectomy   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   Procedure: BIV ICD INSERTION CRT-D;  Surgeon: Maurice Small, MD;  Location: [facility] INVASIVE CV LAB;  Service: Cardiovascular;  Laterality: N/A;   CHOLECYSTECTOMY  1994   and Right Partial Nephrectomy (pelvis for very large stone)   COLONOSCOPY WITH PROPOFOL N/A 03/26/2023   Procedure: COLONOSCOPY WITH PROPOFOL;  Surgeon: Iva Boop, MD;  Location: [facility];  Service: Gastroenterology;  Laterality: N/A;   CYSTOSCOPY W/ URETERAL STENT PLACEMENT Left 12/31/2016   Procedure: CYSTOSCOPY WITH RETROGRADE PYELOGRAM/ LEFT URETERAL STENT PLACEMENT;  Surgeon: Sebastian Ache, MD;  Location: [facility];  Service: Urology;  Laterality: Left;   CYSTOSCOPY WITH RETROGRADE PYELOGRAM, URETEROSCOPY AND STENT PLACEMENT Left 01/18/2017    Procedure: 1ST STAGE CYSTOSCOPY WITH RETROGRADE PYELOGRAM, URETEROSCOPY AND STENT REPLACEMENT;  Surgeon: Sebastian Ache, MD;  Location: [facility];  Service: Urology;  Laterality: Left;   CYSTOSCOPY WITH RETROGRADE PYELOGRAM, URETEROSCOPY AND STENT PLACEMENT Left 02/01/2017   Procedure: 2ND STAGE CYSTOSCOPY WITH RETROGRADE PYELOGRAM, URETEROSCOPY AND STENT REPLACEMENT;  Surgeon: Sebastian Ache, MD;  Location: [facility];  Service: Urology;  Laterality: Left;   ECTOPIC PREGNANCY SURGERY  1988   Left Salpingectomy   HOLMIUM LASER APPLICATION Left 01/18/2017   Procedure: HOLMIUM LASER APPLICATION;  Surgeon: Sebastian Ache, MD;  Location: [facility];  Service: Urology;  Laterality: Left;   HOLMIUM LASER APPLICATION Left 02/01/2017   Procedure: HOLMIUM LASER APPLICATION;  Surgeon: Sebastian Ache, MD;  Location: [facility];  Service: Urology;  Laterality: Left;   LUMBAR DISC SURGERY  01/1999   right L5 -- S1   POLYPECTOMY  03/26/2023   Procedure: POLYPECTOMY;  Surgeon: Iva Boop, MD;  Location: [facility];  Service: Gastroenterology;;   RE-EXPLORATION LUMBAR/  LAMINECTOMY AND MICRODISKECTOMY  10/14/2001   right L5 -- S1   RIGHT HEART CATH N/A 05/23/2022  Procedure: RIGHT HEART CATH;  Surgeon: Dolores Patty, MD;  Location: [facility] INVASIVE CV LAB;  Service: Cardiovascular;  Laterality: N/A;   RIGHT/LEFT HEART CATH AND CORONARY ANGIOGRAPHY N/A 07/18/2021   Procedure: RIGHT/LEFT HEART CATH AND CORONARY ANGIOGRAPHY;  Surgeon: Dolores Patty, MD;  Location: [facility];  Service: Cardiovascular;  Laterality: N/A;   TUBAL LIGATION Bilateral 10/17/1999   PPTL   UMBILICAL HERNIA REPAIR  age 34     Home Medications:  Prior to Admission medications   Medication Sig Start Date End Date Taking? Authorizing Provider  acetaminophen (TYLENOL) 500 MG tablet Take 500-1,000 mg by mouth every 6 (six) hours as needed (for pain.).    Yes [provider]  atorvastatin (LIPITOR) 10 MG tablet Take 10 mg by mouth in the morning. 05/15/21  Yes [provider]  benzonatate (TESSALON) 200 MG capsule Take 1 capsule (200 mg total) by mouth 3 (three) times daily as needed for cough (not relieved with tussionex). 09/17/23  Yes [place], Eric J, DO  bisoprolol (ZEBETA) 5 MG tablet Take 0.5 tablets (2.5 mg total) by mouth daily. 07/19/23  Yes Bensimhon, Bevelyn Buckles, MD  Calcium Carbonate Antacid (TUMS E-X PO) Take 1 tablet by mouth daily.   Yes [provider]  chlorpheniramine-HYDROcodone (TUSSIONEX) 10-8 MG/5ML Take 5 mLs by mouth every 12 (twelve) hours as needed for cough. 09/17/23  Yes [place], Alvira Philips, DO  clopidogrel (PLAVIX) 75 MG tablet Take 1 tablet (75 mg total) by mouth daily. 06/24/23 06/18/24 Yes Butch Penny, NP  digoxin (LANOXIN) 0.125 MG tablet Take 1 tablet (125 mcg total) by mouth daily. 09/03/23  Yes Bensimhon, Bevelyn Buckles, MD  ECOTRIN LOW STRENGTH 81 MG tablet Take 81 mg by mouth daily. Swallow whole.   Yes [provider]  furosemide (LASIX) 20 MG tablet Take 1 tablet (20 mg total) by mouth daily. 08/27/23 08/26/24 Yes Alwyn Ren, MD  JARDIANCE 10 MG TABS tablet Take 10 mg by mouth daily. 09/07/23  Yes [provider]  metFORMIN (GLUCOPHAGE) 500 MG tablet Take 1 tablet (500 mg total) by mouth 2 (two) times daily with a meal. 05/27/23 09/02/24 Yes Tawkaliyar, Roya, DO  midodrine (PROAMATINE) 5 MG tablet Take 1 tablet (5 mg total) by mouth 3 (three) times daily with meals. 09/17/23 12/16/23 Yes [place], Alvira Philips, DO  Multiple Vitamins-Minerals (ONE-A-DAY WOMENS PO) Take 1 tablet by mouth daily with breakfast.   Yes [provider]  ondansetron (ZOFRAN-ODT) 4 MG disintegrating tablet Take 4 mg by mouth every 8 (eight) hours as needed for nausea or vomiting (dissolve orally).   Yes [provider]  spironolactone (ALDACTONE) 25 MG tablet Take 1 tablet (25 mg total) by mouth  daily. 09/18/23 12/17/23 Yes [place], Eric J, DO  sacubitril-valsartan (ENTRESTO) 24-26 MG Take 1 tablet by mouth 2 (two) times daily.    [provider]    Inpatient Medications: Scheduled Meds:  apixaban  10 mg Oral BID   Followed by   Melene Muller ON 10/07/2023] apixaban  5 mg Oral BID   atorvastatin  10 mg Oral q AM   bisoprolol  2.5 mg Oral Daily   clopidogrel  75 mg Oral Daily   digoxin  125 mcg Oral Daily   empagliflozin  10 mg Oral Daily   furosemide  20 mg Oral Daily   midodrine  5 mg Oral TID WC   spironolactone  25 mg Oral Daily   Continuous Infusions:  PRN Meds: acetaminophen **OR** acetaminophen, ondansetron **  OR** ondansetron (ZOFRAN) IV  Allergies:    Allergies  Allergen Reactions   Latex Rash and Hives   Bactrim [Sulfamethoxazole-Trimethoprim] Rash   Carvedilol Itching   Metoprolol Itching and Rash    Social History:   Social History   Socioeconomic History   Marital status: Married    Spouse name: Richard Matteucci   Number of children: 1   Years of education: college   Highest education level: Not on file  Occupational History   Occupation: Product manager  Tobacco Use   Smoking status: Former    Current packs/day: 0.00    Average packs/day: 0.8 packs/day for 5.0 years (3.8 ttl pk-yrs)    Types: Cigarettes    Start date: 01/11/1992    Quit date: 01/10/1997    Years since quitting: 26.7   Smokeless tobacco: Never  Vaping Use   Vaping status: Never Used  Substance and Sexual Activity   Alcohol use: Not Currently    Comment: social use; once a month   Drug use: No   Sexual activity: Yes    Partners: Male    Birth control/protection: Surgical  Other Topics Concern   Not on file  Social History Narrative   Lives with her husband and their daughter and their dog.   Social Drivers of Corporate investment banker Strain: Low Risk  (09/24/2023)   Received from [facility]   Overall Financial Resource Strain (CARDIA)    Difficulty of  Paying Living Expenses: Not very hard  Food Insecurity: No Food Insecurity (09/30/2023)   Hunger Vital Sign    Worried About Running Out of Food in the Last Year: Never true    Ran Out of Food in the Last Year: Never true  Transportation Needs: No Transportation Needs (09/30/2023)   PRAPARE - Administrator, Civil Service (Medical): No    Lack of Transportation (Non-Medical): No  Physical Activity: Unknown (09/24/2023)   Received from [facility]   Exercise Vital Sign    Days of Exercise per Week: 0 days    Minutes of Exercise per Session: Not on file  Stress: No Stress Concern Present (09/24/2023)   Received from [facility] of Occupational Health - Occupational Stress Questionnaire    Feeling of Stress : Not at all  Social Connections: Patient Declined (09/30/2023)   Social Connection and Isolation Panel [NHANES]    Frequency of Communication with Friends and Family: Patient declined    Frequency of Social Gatherings with Friends and Family: Patient declined    Attends Religious Services: Patient declined    Database administrator or Organizations: Patient declined    Attends Banker Meetings: Patient declined    Marital Status: Patient declined  Intimate Partner Violence: Not At Risk (09/30/2023)   Humiliation, Afraid, Rape, and Kick questionnaire    Fear of Current or Ex-Partner: No    Emotionally Abused: No    Physically Abused: No    Sexually Abused: No    Family History:   Family History  Problem Relation Age of Onset   Sarcoidosis Mother    Prostate cancer Father    Hypertension Sister    Eczema Sister    Lupus Sister    Breast cancer Maternal Aunt    Gout Maternal Grandmother    Diabetes Maternal Grandmother        leg amputations   Stomach cancer Neg Hx    Colon cancer Neg Hx    Esophageal  cancer Neg Hx    Rectal cancer Neg Hx      ROS:  Please see the history of present illness.    Physical Exam/Data:    Vitals:   10/01/23 1436 10/01/23 2000 10/01/23 2113 10/02/23 0505  BP: 97/62  111/81 106/80  Pulse: 75  72 84  Resp: 20 15 18 18   Temp: 98.1 F (36.7 C)  98.1 F (36.7 C) 97.7 F (36.5 C)  TempSrc: Oral     SpO2: 94%  100% 99%  Weight:      Height:        Intake/Output Summary (Last 24 hours) at 10/02/2023 1006 Last data filed at 10/01/2023 1300 Gross per 24 hour  Intake --  Output 900 ml  Net -900 ml      09/30/2023   12:16 PM 09/16/2023    5:00 AM 09/15/2023    4:03 AM  Last 3 Weights  Weight (lbs) 190 lb 204 lb 2.3 oz 198 lb 10.2 oz  Weight (kg) 86.183 kg 92.6 kg 90.1 kg     Body mass index is 25.07 kg/m.  General: 63 y.o. African-American female resting comfortably in no acute distress. HEENT: Normocephalic and atraumatic. Sclera clear.  Neck: Supple. No JVD. Heart: RRR. Distinct S1 and S2. No murmurs, gallops, or rubs.  Lungs: No increased work of breathing. Clear to ausculation bilaterally. No wheezes, rhonchi, or rales.  Abdomen: Soft, non-distended, and non-tender to palpation.  Extremities: No lower extremity edema.    Skin: Warm and dry. Neuro: Alert and oriented x3. No focal deficits. Psych: Normal affect. Responds appropriately.   EKG:  The EKG was personally reviewed and demonstrates:  Ventricular paced rhythm. Telemetry:  Telemetry was personally reviewed and demonstrates:  AV paced rhythm.  Relevant CV Studies:  Right/ Left Cardiac Catheterization 07/18/2021:   Dist LAD lesion is 20% stenosed.   Mid Cx lesion is 20% stenosed.   The left ventricular ejection fraction is less than 25% by visual estimate.   Findings: Ao = 106/69 (86) LV = 107/29 RA =  6 RV = 42/7 PA = 41/22 (31) PCW = 21 Fick cardiac output/index = 6.8/3.1 PVR = 1.5 WU SVR = 941 Ao sat = 99% PA sat = 65%, 70%   Assessment: 1. Minimal non-obstructive CAD 2. 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 Mildly elevated filling pressures with normal output   Plan/Discussion: Continue to diurese.  Titrate GDMT as BP tolerates. cMRI tomorrow.   Diagnostic Dominance: Left  _______________  Cardiac MRI 07/19/2021: Impressions: Severely reduced LV function. Though modified [facility] Criteria for myocarditis is technically met, LGE and parametric mapping are not classic for this and clinical correlation is advised. _______________  Echocardiogram 08/25/2023: Impressions: 1. Left ventricular ejection fraction, by estimation, is 20 to 25%. The  left ventricle has severely decreased function. The left ventricle  demonstrates global hypokinesis. The left ventricular internal cavity size  was severely dilated. Left ventricular  diastolic parameters are consistent with Grade II diastolic dysfunction  (pseudonormalization). Elevated left atrial pressure.   2. Right ventricular systolic function is normal. The right ventricular  size is normal. Tricuspid regurgitation signal is inadequate for assessing  PA pressure.   3. Left atrial size was moderately dilated.   4. The mitral valve is normal in structure. Trivial mitral valve  regurgitation. No evidence of mitral stenosis.   5. The aortic valve is tricuspid. Aortic valve regurgitation is not  visualized. No aortic stenosis is present.   6. The inferior vena  cava is normal in size with greater than 50%  respiratory variability, suggesting right atrial pressure of 3 mmHg.    Laboratory Data:  High Sensitivity Troponin:   Recent Labs  Lab 09/12/23 0454 09/12/23 0607 09/30/23 1229 09/30/23 1435  TROPONINIHS 35* 39* 28* 32*     Chemistry Recent Labs  Lab 09/30/23 1229  NA 139  K 4.0  CL 107  CO2 22  GLUCOSE 96  BUN 19  CREATININE 0.91  CALCIUM 9.1  GFRNONAA >60  ANIONGAP 10    Recent Labs  Lab 09/30/23 1229  PROT 7.2  ALBUMIN 3.9  AST 23  ALT 27  ALKPHOS 55  BILITOT 0.6   Lipids No results for input(s): "CHOL", "TRIG", "HDL", "LABVLDL", "LDLCALC", "CHOLHDL" in the last 168 hours.  Hematology Recent Labs   Lab 09/30/23 1229  WBC 5.7  RBC 3.56*  HGB 9.9*  HCT 32.2*  MCV 90.4  MCH 27.8  MCHC 30.7  RDW 15.6*  PLT 263   Thyroid No results for input(s): "TSH", "FREET4" in the last 168 hours.  BNP Recent Labs  Lab 09/30/23 1229  BNP 690.8*    DDimer No results for input(s): "DDIMER" in the last 168 hours.   Radiology/Studies:  VAS [place] LOWER EXTREMITY VENOUS (DVT) Result Date: 10/01/2023  Lower Venous DVT Study Patient Name:  TATUM CORL  Date of Exam:   10/01/2023 Medical Rec #: [id number]          Accession #:    [id number] Date of Birth: [birth year]         Patient Gender: F Patient Age:   37 years Exam Location:  [facility] Procedure:      VAS [place] LOWER EXTREMITY VENOUS (DVT) Referring Phys: Brendia Sacks --------------------------------------------------------------------------------  Indications: Pulmonary embolism.  Comparison Study: LLEV yesterday, negative for DVT, positive for SVT in SSV. Performing Technologist: Ernestene Mention RVT, RDMS  Examination Guidelines: A complete evaluation includes B-mode imaging, spectral Doppler, color Doppler, and power Doppler as needed of all accessible portions of each vessel. Bilateral testing is considered an integral part of a complete examination. Limited examinations for reoccurring indications may be performed as noted. The reflux portion of the exam is performed with the patient in reverse Trendelenburg.  +---------+---------------+---------+-----------+----------+--------------+ RIGHT    CompressibilityPhasicitySpontaneityPropertiesThrombus Aging +---------+---------------+---------+-----------+----------+--------------+ CFV      Full           Yes      Yes                                 +---------+---------------+---------+-----------+----------+--------------+ SFJ      Full                                                        +---------+---------------+---------+-----------+----------+--------------+ FV Prox  Full            Yes      Yes                                 +---------+---------------+---------+-----------+----------+--------------+ FV Mid   Full           Yes      Yes                                 +---------+---------------+---------+-----------+----------+--------------+  FV DistalFull           Yes      Yes                                 +---------+---------------+---------+-----------+----------+--------------+ PFV      Full                                                        +---------+---------------+---------+-----------+----------+--------------+ POP      Full           Yes      Yes                                 +---------+---------------+---------+-----------+----------+--------------+ PTV      Full                                                        +---------+---------------+---------+-----------+----------+--------------+ PERO     Full                                                        +---------+---------------+---------+-----------+----------+--------------+     Summary: RIGHT: - There is no evidence of deep vein thrombosis in the lower extremity.  - No cystic structure found in the popliteal fossa.   *See table(s) above for measurements and observations. Electronically signed by Lemar Livings MD on 10/01/2023 at 4:46:43 PM.    Final    VAS [place] LOWER EXTREMITY VENOUS (DVT) (ONLY [facility]) Result Date: 09/30/2023  Lower Venous DVT Study Patient Name:  VENICE MARCUCCI  Date of Exam:   09/30/2023 Medical Rec #: [id number]          Accession #:    [id number] Date of Birth: July 17, 1960         Patient Gender: F Patient Age:   86 years Exam [facility]  [facility] Procedure:      VAS [place] LOWER EXTREMITY VENOUS (DVT) Referring Phys: Gloris Manchester --------------------------------------------------------------------------------  Indications: Pain.  Risk Factors: Past pregnancy. Limitations: Poor ultrasound/tissue interface. Comparison Study: None.  Performing Technologist: Shona Simpson  Examination Guidelines: A complete evaluation includes B-mode imaging, spectral Doppler, color Doppler, and power Doppler as needed of all accessible portions of each vessel. Bilateral testing is considered an integral part of a complete examination. Limited examinations for reoccurring indications may be performed as noted. The reflux portion of the exam is performed with the patient in reverse Trendelenburg.  +-----+---------------+---------+-----------+----------+--------------+ RIGHTCompressibilityPhasicitySpontaneityPropertiesThrombus Aging +-----+---------------+---------+-----------+----------+--------------+ CFV  Full           Yes      Yes                                 +-----+---------------+---------+-----------+----------+--------------+   +--------+---------------+---------+-----------+----------------+-------------+ LEFT    CompressibilityPhasicitySpontaneityProperties      Thrombus  Aging         +--------+---------------+---------+-----------+----------------+-------------+ CFV     Full           Yes      Yes                                      +--------+---------------+---------+-----------+----------------+-------------+ SFJ     Full                                                             +--------+---------------+---------+-----------+----------------+-------------+ FV Prox Full                                                             +--------+---------------+---------+-----------+----------------+-------------+ FV Mid  Full                                                             +--------+---------------+---------+-----------+----------------+-------------+ FV      Full                    Yes                                      Distal                                                                    +--------+---------------+---------+-----------+----------------+-------------+ PFV     Full                                                             +--------+---------------+---------+-----------+----------------+-------------+ POP     Full           Yes      Yes                                      +--------+---------------+---------+-----------+----------------+-------------+ PTV     Full                                                             +--------+---------------+---------+-----------+----------------+-------------+ PERO    Full                                                             +--------+---------------+---------+-----------+----------------+-------------+  SSV     None           No       No         brightly        Acute                                                    echogenic                     +--------+---------------+---------+-----------+----------------+-------------+     Summary: RIGHT: - No evidence of common femoral vein obstruction.   LEFT: - Findings consistent with acute superficial vein thrombosis involving the left small saphenous vein.  - No cystic structure found in the popliteal fossa.  *See table(s) above for measurements and observations. Electronically signed by Sherald Hess MD on 09/30/2023 at 5:42:49 PM.    Final    CT Angio Chest PE W and/or Wo Contrast Result Date: 09/30/2023 CLINICAL DATA:  Productive cough, lower extremity edema, short of breath for 2 days EXAM: CT ANGIOGRAPHY CHEST WITH CONTRAST TECHNIQUE: Multidetector CT imaging of the chest was performed using the standard protocol during bolus administration of intravenous contrast. Multiplanar CT image reconstructions and MIPs were obtained to evaluate the vascular anatomy. RADIATION DOSE REDUCTION: This exam was performed according to the departmental dose-optimization program which includes automated exposure control, adjustment of the mA and/or kV  according to patient size and/or use of iterative reconstruction technique. CONTRAST:  75mL OMNIPAQUE IOHEXOL 350 MG/ML SOLN COMPARISON:  09/12/2023, 09/30/2023 FINDINGS: Cardiovascular: This is a technically adequate evaluation of the pulmonary vasculature. There are segmental right lower lobe pulmonary emboli identified, new since prior study. Clot burden is minimal. No evidence of right heart strain. Stable cardiomegaly with prominent left ventricular dilatation. Multi lead pacemaker unchanged. Normal caliber of the thoracic aorta. Stable atherosclerosis of the aorta and coronary vasculature. Mediastinum/Nodes: Stable borderline enlarged mediastinal and hilar lymph nodes, likely reactive. Thyroid, trachea, and esophagus are unremarkable. Lungs/Pleura: Since the prior CT, there has been significant improvement in the bilateral patchy airspace disease, with residual faint bilateral ground-glass airspace disease remaining most pronounced in the right lower lobe. Linear areas of consolidation at the left lung base likely reflect subsegmental atelectasis or scarring, and are stable. No effusion or pneumothorax. The central airways are patent. Upper Abdomen: No acute abnormality. Musculoskeletal: No acute or destructive bony abnormalities. Reconstructed images demonstrate no additional findings. Review of the MIP images confirms the above findings. IMPRESSION: 1. Right lower lobe segmental pulmonary emboli, new since prior study. Minimal clot burden with no evidence of right heart strain. 2. Marked improvement in the bilateral airspace disease seen previously, with residual faint ground-glass opacities most pronounced in the right lower lobe compatible with resolving pneumonia. 3. Stable borderline enlarged mediastinal and hilar lymph nodes, consistent with reactive adenopathy. 4. Stable cardiomegaly. 5. Aortic Atherosclerosis (ICD10-I70.0). Coronary artery atherosclerosis. Critical Value/emergent results were called  by telephone at the time of interpretation on 09/30/2023 at 4:59 p.m. to provider Gloris Manchester , who verbally acknowledged these results. Electronically Signed   By: Sharlet Salina M.D.   On: 09/30/2023 17:02   DG Chest 2 View Result Date: 09/30/2023 CLINICAL DATA:  Shortness of breath. EXAM: CHEST - 2 VIEW COMPARISON:  Chest radiograph dated 09/12/2023. FINDINGS: Stable cardiomegaly. Stable left chest wall cardiac  device in place. Aortic atherosclerosis. Improved bilateral multifocal airspace disease with residual airspace opacities in the right mid and lower lung zones and lingula. No pleural effusion or pneumothorax. No acute osseous abnormality. IMPRESSION: Improved bilateral multifocal airspace disease, with residual airspace opacities in the right mid and lower lung zones and lingula. These findings could reflect pulmonary edema or pneumonia. Electronically Signed   By: Hart Robinsons M.D.   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 Last Echo in 08/2023 showed LVEF of 20-25% with global hypokinesis and grade 2 diastolic dysfunction, normal RV function, and no significant valvular disease. She was admitted with acute hypoxic respiratory failure secondary to PE and CHF. BNP elevated at 690.8. Chest x-ray showed improved bilateral multifocal airspace disease with residual airspace opacities in the right mid and lower lung zones and lingula. Patient was started on IV Lasix with some improvement in symptoms. Documented urinary 900cc yesterday but it does not look like I/Os have been fully documented. No updated weight or labs from admission. - She reports improvement in dyspnea with Lasix and treatment of PE but states she is still not back to baseline. Edema has resolved. She does not look significantly volume overloaded on exam.  - Will repeat BNP and BMET today. - Will hold off on additional IV  Lasix until today's labs come back. OK to continue PO Lasix 20mg  daily for now.  - No Entresto due to hypotension. - Currently on Bisoprolol 2.5mg  daily, Spironolactone 25mg  daily, Jardiance 10mg  daily, and Digoxin 0.125mg  daily as well a Midodrine 5mg  three times daily. I would recommend decreasing or stopping Spironolactone and then stopping Midodrine if able. Will discuss this with MD. - Please monitor daily weights, strict I/Os, and renal function.   Of note, patient expressed frustration about multiple hospitalization lately and would like to get a second opinion at [facility] about her heart failure. She would like to discuss this with Dr. Gala Romney. I did explain that here recent hospitalization have been primarily for pneumonia and not CHF. She understands this but is just frustrated by frequent admission.  Minimal Non-Obstructive CAD Noted on cardiac catheterization in 07/2021.  - No chest pain.  - No aspirin due to need for full anticoagulation.  - Continue statin.  Hypertension History of hypertension. However, she has struggled more with hypotension lately.  - She is on medications that both lower and raise her BP as described above under. I would consider decreasing or stopping Spironolactone and then stopping Midodrine if able. Will discuss this with MD.  Hyperlipidemia - Continue Lipitor 10mg  daily.   Otherwise, per primary team: - Acute PE: On Eliquis - Superficial vein thrombosis - Type 2 diabetes mellitus - Anemia   Risk Assessment/Risk Scores:    New York Heart Association (NYHA) Functional Class NYHA Class IV   For questions or updates, please contact [facility] Please consult [email or web address] for contact info under    Signed, Corrin Parker, PA-C  10/02/2023 10:06 AM  Patient seen and examined with Marjie Skiff, PA-C.  Agree as above, with the following exceptions and changes as noted below.  63 year old female recently seen on a prior admission,  now noted to have pulmonary embolism, with acute hypoxic respiratory failure felt secondary to PE and possibly contribution of CHF, she has been diuresed and has been started on Eliquis for PE.  I saw the patient while  she was ambulating the halls, and did quite well without hypoxia.  She is not on supplemental oxygen and speaks in full sentences with no dyspnea.  She feels she is tolerating her current medical therapy well and feels confused by the constant changes every hospital admission.  She notes that she is always hypotensive but does not feel particularly symptomatic with this.Gen: NAD, CV: RRR, no murmurs, Lungs: clear, Abd: soft, Extrem: Warm, well perfused, no edema, Neuro/Psych: alert and oriented x 3, normal mood and affect. All available labs, radiology testing, previous records reviewed.  We discussed in great detail that if she is happy on her current medical regimen, no strong indication to change that despite hypotension per patient preference.  Her current medication regimen includes bisoprolol 2.5 mg daily, spironolactone 25 mg daily, Jardiance 10 mg daily, and digoxin 0.125 mg daily.  She also takes midodrine 5 mg 3 times daily for hypotension.  We discussed that agents that raise the blood pressure and lower the blood pressure may be in contradiction, but she feels settled and comfortable on her current heart failure therapy and requests no changes if possible.  This can be discussed further as an outpatient.  She is considering second opinion at [facility] for her recurrent respiratory failure admissions, which would be reasonable and if she required cardiology evaluation we would be happy to assist in any way obtaining a second opinion.  She has been transitioned off of IV Lasix now to oral Lasix, she does not routinely take oral Lasix at home, and if she prefers this can be followed by OptiVol readings on her device and can be taken as needed if needed.  The patient is eager to go home, from a  cardiovascular standpoint after watching the patient ambulate it may be reasonable to consider hospital discharge from a cardiovascular standpoint when ready from a medical standpoint.  Medication regimen as detailed above: Bisoprolol 2.5 mg daily Spironolactone 25 mg daily Jardiance 10 mg daily Digoxin 0.125 mg daily Lasix 20 mg daily today, and as needed moving forward per patient preference.  Appears euvolemic.  She has a heart failure TOC visit on 10/10/2023, I have encouraged her to keep this appointment.  Parke Poisson, MD 10/02/23 4:52 PM

## 2023-10-02 ENCOUNTER — Other Ambulatory Visit (HOSPITAL_COMMUNITY): Payer: Self-pay

## 2023-10-02 DIAGNOSIS — I2699 Other pulmonary embolism without acute cor pulmonale: Secondary | ICD-10-CM | POA: Diagnosis not present

## 2023-10-02 DIAGNOSIS — E785 Hyperlipidemia, unspecified: Secondary | ICD-10-CM | POA: Diagnosis not present

## 2023-10-02 DIAGNOSIS — I2510 Atherosclerotic heart disease of native coronary artery without angina pectoris: Secondary | ICD-10-CM

## 2023-10-02 DIAGNOSIS — I959 Hypotension, unspecified: Secondary | ICD-10-CM | POA: Diagnosis not present

## 2023-10-02 DIAGNOSIS — I5043 Acute on chronic combined systolic (congestive) and diastolic (congestive) heart failure: Secondary | ICD-10-CM | POA: Diagnosis not present

## 2023-10-02 LAB — BASIC METABOLIC PANEL WITH GFR
Anion gap: 10 (ref 5–15)
BUN: 22 mg/dL (ref 8–23)
CO2: 28 mmol/L (ref 22–32)
Calcium: 9.5 mg/dL (ref 8.9–10.3)
Chloride: 102 mmol/L (ref 98–111)
Creatinine, Ser: 1.09 mg/dL — ABNORMAL HIGH (ref 0.44–1.00)
GFR, Estimated: 58 mL/min — ABNORMAL LOW (ref >60)
Glucose, Bld: 162 mg/dL — ABNORMAL HIGH (ref 70–99)
Potassium: 3.7 mmol/L (ref 3.5–5.1)
Sodium: 140 mmol/L (ref 135–145)

## 2023-10-02 LAB — CBC
HCT: 37.9 % (ref 36.0–46.0)
Hemoglobin: 11.5 g/dL — ABNORMAL LOW (ref 12.0–15.0)
MCH: 27.8 pg (ref 26.0–34.0)
MCHC: 30.3 g/dL (ref 30.0–36.0)
MCV: 91.5 fL (ref 80.0–100.0)
Platelets: 260 10*3/uL (ref 150–400)
RBC: 4.14 MIL/uL (ref 3.87–5.11)
RDW: 15.6 % — ABNORMAL HIGH (ref 11.5–15.5)
WBC: 3.5 10*3/uL — ABNORMAL LOW (ref 4.0–10.5)
nRBC: 0 % (ref 0.0–0.2)

## 2023-10-02 LAB — BRAIN NATRIURETIC PEPTIDE: B Natriuretic Peptide: 473.8 pg/mL — ABNORMAL HIGH (ref 0.0–100.0)

## 2023-10-02 MED ORDER — SENNOSIDES-DOCUSATE SODIUM 8.6-50 MG PO TABS
1.0000 | ORAL_TABLET | Freq: Two times a day (BID) | ORAL | Status: DC
  Administered 2023-10-02: 1 via ORAL
  Filled 2023-10-02: qty 1

## 2023-10-02 MED ORDER — FUROSEMIDE 20 MG PO TABS: 20.0000 mg | ORAL_TABLET | Freq: Every day | ORAL | 0 refills | Status: DC | PRN

## 2023-10-02 MED ORDER — APIXABAN (ELIQUIS) VTE STARTER PACK (10MG AND 5MG): ORAL_TABLET | ORAL | 0 refills | Status: DC

## 2023-10-02 MED ORDER — APIXABAN (ELIQUIS) VTE STARTER PACK (10MG AND 5MG)
ORAL_TABLET | ORAL | 0 refills | Status: DC
  Filled 2023-10-02: qty 74, 30d supply, fill #0

## 2023-10-02 NOTE — TOC Progression Note (Signed)
 Transition of Care [facility]) - Progression Note    Patient Details  Name: Lynn Estrada MRN: [id number] Date of Birth: 10-Oct-1960  [facility] of [facility] CM/SW Contact  Larrie Kass, LCSW Phone Number: 10/02/2023, 12:19 PM  Clinical Narrative:    CSW met with pt at bedside to discuss consult about insurance questions.Pt inquired if CSW knew if there is any providers in [place] that will take her insurance. CSW informed pt to call her insurance to ger in network provider.  Pt inquired if CSW can place a referral for a cardiologist in [place]. CSW informed pt she would have to get referral from her primary care provider. Pt reports no transportation needs , stating she will get someone to pick her up from the hospital upon d/c. Pt reports using walker at baseline which she has bedside. TOC sign off.    Expected Discharge Plan: Home/Self Care Barriers to Discharge: Continued Medical Work up  Expected Discharge Plan and Services                                               Social Determinants of Health (SDOH) Interventions SDOH Screenings   Food Insecurity: No Food Insecurity (09/30/2023)  Housing: Low Risk  (09/30/2023)  Transportation Needs: No Transportation Needs (09/30/2023)  Utilities: Not At Risk (09/30/2023)  Depression (PHQ2-9): Low Risk  (03/16/2022)  Financial Resource Strain: Low Risk  (09/24/2023)   Received from [facility]  Physical Activity: Unknown (09/24/2023)   Received from [facility]  Social Connections: Patient Declined (09/30/2023)  Stress: No Stress Concern Present (09/24/2023)   Received from [facility]  Tobacco Use: Medium Risk (09/30/2023)    Readmission Risk Interventions     No data to display

## 2023-10-02 NOTE — Plan of Care (Signed)

## 2023-10-02 NOTE — Discharge Summary (Signed)
 Physician Discharge Summary  KARYME MCCONATHY WGN:[id number] DOB: 01/12/1961 DOA: 09/30/2023  PCP: Porfirio Oar, PA  Admit date: 09/30/2023 Discharge date: 10/02/2023 Recommendations for Outpatient Follow-up:  Follow up with PCP in 1 weeks-call for appointment Please obtain BMP/CBC in one week Please take ELIQUIS 10 mg BID x 7 days (started on 09/30/23 PM) after that take 5 mg bid.   Discharge Dispo: home Discharge Condition: Stable Code Status:   Code Status: Full Code Diet recommendation:  Diet Order             Diet regular Room service appropriate? Yes; Fluid consistency: Thin  Diet effective now                    Brief/Interim Summary: 63 year old woman PMH including systolic CHF, left bundle branch block, CRT defibrillator, recently hospitalized in February for acute on chronic CHF, pneumonia.  Hospitalized again 3/13 to 3/18 for pneumonia, hypoxia, and medications were adjusted by cardiology given hypotension.  Presented 3/31 with shortness of breath.  Admitted for acute PE, acute on chronic CHF. She was anticoagulated seen by cardio due to soft BP GDMT adjusted. Taken off plavix after discussing with ON CALL Neuro as she is now on eliquis. She is advised to follow up with her neurology.  Consultants Cardiology 4/2  Procedures/Testing: CTA 3/31: RLL segmental PE borderline enlarged mediastinal hilar lymph nodes cardiomegaly aortic stenosis  Subjective: Patient seen and examined this morning Resting comfortably.  No complaints. Overnight afebrile BP 100, on room air.  Discharge Diagnoses: Primary Problem:Acute RLL PE Superficial vein thrombosis left saphenous: Elevated troponin trivial likely demand ischemia from PE: Acute hypoxic respiratory failure ruled out.  CTA with minimal clot burden, patient echo from February stable no repeat echo ordered given low clot burden.  Continue Eliquis.  GDMT on hold no Entresto due to hypotension await further cardiology  recommendation. Okay to continue Lasix.  No Entresto due to hypotension. Further meds per cardiology: Bisoprolol 2.5 mg daily Spironolactone 25 mg daily Jardiance 10 mg daily Digoxin 0.125 mg daily Lasix 20 mg daily  prn.  Acute on chronic combined systolic and diastolic CHF: BNP was elevated given IV Lasix with good response.Continue GDMT with Aldactone and Jardiance.  GDMT being adjusted  see above. Cont prn lasix.  Status post BiV PPM On monitor   Type 2 diabetes mellitus: PTA on metformin -given blood sugar well-controlled  and last A1c 7.1 in 05/24/23 consider discontinuation on discharge. Continue Jardiance.   History of CVA in nov 2024 on Plavix since Nov 2024 HLD Aortic atherosclerosis/coronary artery atherosclerosis: Continue Lipitor.discussed with Dr Armanda Magic  re- plavix sine she will be on eliquis and stopped plavix. She will need to follow-up with neurology as outpatient.  Chronic normocytic anemia: Stable    Discharge Exam: Vitals:   10/02/23 1243 10/02/23 1634  BP: 97/62 105/66  Pulse:    Resp: 16   Temp: 98.4 F (36.9 C)   SpO2: 98% 99%   General: Pt is alert, awake, not in acute distress Cardiovascular: RRR, S1/S2 +, no rubs, no gallops Respiratory: CTA bilaterally, no wheezing, no rhonchi Abdominal: Soft, NT, ND, bowel sounds + Extremities: no edema, no cyanosis  Discharge Instructions  Discharge Instructions     (HEART FAILURE PATIENTS) Call MD:  Anytime you have any of the following symptoms: 1) 3 pound weight gain in 24 hours or 5 pounds in 1 week 2) shortness of breath, with or without a dry hacking cough 3) swelling in the  hands, feet or stomach 4) if you have to sleep on extra pillows at night in order to breathe.   Complete by: As directed    Discharge instructions   Complete by: As directed    Please call call MD or return to ER for similar or worsening recurring problem that brought you to hospital or if any fever,nausea/vomiting,abdominal  pain, uncontrolled pain, chest pain,  shortness of breath or any other alarming symptoms.  Please follow-up your doctor as instructed in a week time and call the office for appointment.  Please follow up with your neurology re your antiplatelets as well.  Please avoid alcohol, smoking, or any other illicit substance and maintain healthy habits including taking your regular medications as prescribed.  You were cared for by a hospitalist during your hospital stay. If you have any questions about your discharge medications or the care you received while you were in the hospital after you are discharged, you can call the unit and ask to speak with the hospitalist on call if the hospitalist that took care of you is not available.  Once you are discharged, your primary care physician will handle any further medical issues. Please note that NO REFILLS for any discharge medications will be authorized once you are discharged, as it is imperative that you return to your primary care physician (or establish a relationship with a primary care physician if you do not have one) for your aftercare needs so that they can reassess your need for medications and monitor your lab values   Increase activity slowly   Complete by: As directed       Allergies as of 10/02/2023       Reactions   Latex Rash, Hives   Bactrim [sulfamethoxazole-trimethoprim] Rash   Carvedilol Itching   Metoprolol Itching, Rash        Medication List     STOP taking these medications    clopidogrel 75 MG tablet Commonly known as: PLAVIX   Ecotrin Low Strength 81 MG tablet Generic drug: aspirin EC   Entresto 24-26 MG Generic drug: sacubitril-valsartan   guaiFENesin 600 MG 12 hr tablet Commonly known as: MUCINEX   metFORMIN 500 MG tablet Commonly known as: GLUCOPHAGE       TAKE these medications    acetaminophen 500 MG tablet Commonly known as: TYLENOL Take 500-1,000 mg by mouth every 6 (six) hours as needed (for  pain.).   Apixaban Starter Pack (10mg  and 5mg ) Commonly known as: ELIQUIS STARTER PACK Take as directed on package: start with two-5mg  tablets twice daily till 4/7/ am then from 10/07/23 evening take one-5mg  tablet twice daily.   atorvastatin 10 MG tablet Commonly known as: LIPITOR Take 10 mg by mouth in the morning.   benzonatate 200 MG capsule Commonly known as: TESSALON Take 1 capsule (200 mg total) by mouth 3 (three) times daily as needed for cough (not relieved with tussionex).   bisoprolol 5 MG tablet Commonly known as: ZEBETA Take 0.5 tablets (2.5 mg total) by mouth daily.   chlorpheniramine-HYDROcodone 10-8 MG/5ML Commonly known as: TUSSIONEX Take 5 mLs by mouth every 12 (twelve) hours as needed for cough.   digoxin 0.125 MG tablet Commonly known as: LANOXIN Take 1 tablet (125 mcg total) by mouth daily.   furosemide 20 MG tablet Commonly known as: Lasix Take 1 tablet (20 mg total) by mouth daily as needed. What changed:  when to take this reasons to take this   Jardiance 10 MG Tabs tablet Generic  drug: empagliflozin Take 10 mg by mouth daily.   midodrine 5 MG tablet Commonly known as: PROAMATINE Take 1 tablet (5 mg total) by mouth 3 (three) times daily with meals.   ondansetron 4 MG disintegrating tablet Commonly known as: ZOFRAN-ODT Take 4 mg by mouth every 8 (eight) hours as needed for nausea or vomiting (dissolve orally).   ONE-A-DAY WOMENS PO Take 1 tablet by mouth daily with breakfast.   spironolactone 25 MG tablet Commonly known as: ALDACTONE Take 1 tablet (25 mg total) by mouth daily.   TUMS E-X PO Take 1 tablet by mouth daily.        Follow-up Information     Leotis Shames [place], [place]. Schedule an appointment as soon as possible for a visit in 1 week(s).   Specialty: Family Medicine Contact information: [address] [place] [address] [phone]                Allergies  Allergen Reactions   Latex Rash and  Hives   Bactrim [Sulfamethoxazole-Trimethoprim] Rash   Carvedilol Itching   Metoprolol Itching and Rash   The results of significant diagnostics from this hospitalization (including imaging, microbiology, ancillary and laboratory) are listed below for reference.   Microbiology: Recent Results (from the past 240 hours)  Resp panel by RT-PCR (RSV, Flu A&B, Covid) Anterior Nasal Swab     Status: None   Collection Time: 09/30/23 12:29 PM   Specimen: Anterior Nasal Swab  Result Value Ref Range Status   SARS Coronavirus 2 by RT PCR NEGATIVE NEGATIVE Final    Comment: (NOTE) SARS-CoV-2 target nucleic acids are NOT DETECTED.  The SARS-CoV-2 RNA is generally detectable in upper respiratory specimens during the acute phase of infection. The lowest concentration of SARS-CoV-2 viral copies this assay can detect is 138 copies/mL. A negative result does not preclude SARS-Cov-2 infection and should not be used as the sole basis for treatment or other patient management decisions. A negative result may occur with  improper specimen collection/handling, submission of specimen other than nasopharyngeal swab, presence of viral mutation(s) within the areas targeted by this assay, and inadequate number of viral copies(<138 copies/mL). A negative result must be combined with clinical observations, patient history, and epidemiological information. The expected result is Negative.  Fact Sheet for Patients:  BloggerCourse.com  Fact Sheet for Healthcare Providers:  SeriousBroker.it  This test is no t yet approved or cleared by the [place] FDA and  has been authorized for detection and/or diagnosis of SARS-CoV-2 by FDA under an Emergency Use Authorization (EUA). This EUA will remain  in effect (meaning this test can be used) for the duration of the COVID-19 declaration under Section 564(b)(1) of the Act, 21 U.S.C.section 360bbb-3(b)(1), unless the  authorization is terminated  or revoked sooner.       Influenza A by PCR NEGATIVE NEGATIVE Final   Influenza B by PCR NEGATIVE NEGATIVE Final    Comment: (NOTE) The Xpert Xpress SARS-CoV-2/FLU/RSV plus assay is intended as an aid in the diagnosis of influenza from Nasopharyngeal swab specimens and should not be used as a sole basis for treatment. Nasal washings and aspirates are unacceptable for Xpert Xpress SARS-CoV-2/FLU/RSV testing.  Fact Sheet for Patients: BloggerCourse.com  Fact Sheet for Healthcare Providers: SeriousBroker.it  This test is not yet approved or cleared by the [place] FDA and has been authorized for detection and/or diagnosis of SARS-CoV-2 by FDA under an Emergency Use Authorization (EUA). This EUA will remain in effect (meaning this  test can be used) for the duration of the COVID-19 declaration under Section 564(b)(1) of the Act, 21 U.S.C. section 360bbb-3(b)(1), unless the authorization is terminated or revoked.     Resp Syncytial Virus by PCR NEGATIVE NEGATIVE Final    Comment: (NOTE) Fact Sheet for Patients: BloggerCourse.com  Fact Sheet for Healthcare Providers: SeriousBroker.it  This test is not yet approved or cleared by the [place] FDA and has been authorized for detection and/or diagnosis of SARS-CoV-2 by FDA under an Emergency Use Authorization (EUA). This EUA will remain in effect (meaning this test can be used) for the duration of the COVID-19 declaration under Section 564(b)(1) of the Act, 21 U.S.C. section 360bbb-3(b)(1), unless the authorization is terminated or revoked.  Performed at [facility], [address], [place], [place] [address]    Procedures/Studies: VAS [place] LOWER EXTREMITY VENOUS (DVT) Result Date: 10/01/2023  Lower Venous DVT Study Patient Name:  INITA URAM  Date of Exam:   10/01/2023  Medical Rec #: [id number]          Accession #:    [id number] Date of Birth: 1960-08-02         Patient Gender: F Patient Age:   41 years Exam Location:  [facility] Procedure:      VAS [place] LOWER EXTREMITY VENOUS (DVT) Referring Phys: Brendia Sacks --------------------------------------------------------------------------------  Indications: Pulmonary embolism.  Comparison Study: LLEV yesterday, negative for DVT, positive for SVT in SSV. Performing Technologist: Ernestene Mention RVT, RDMS  Examination Guidelines: A complete evaluation includes B-mode imaging, spectral Doppler, color Doppler, and power Doppler as needed of all accessible portions of each vessel. Bilateral testing is considered an integral part of a complete examination. Limited examinations for reoccurring indications may be performed as noted. The reflux portion of the exam is performed with the patient in reverse Trendelenburg.  +---------+---------------+---------+-----------+----------+--------------+ RIGHT    CompressibilityPhasicitySpontaneityPropertiesThrombus Aging +---------+---------------+---------+-----------+----------+--------------+ CFV      Full           Yes      Yes                                 +---------+---------------+---------+-----------+----------+--------------+ SFJ      Full                                                        +---------+---------------+---------+-----------+----------+--------------+ FV Prox  Full           Yes      Yes                                 +---------+---------------+---------+-----------+----------+--------------+ FV Mid   Full           Yes      Yes                                 +---------+---------------+---------+-----------+----------+--------------+ FV DistalFull           Yes      Yes                                 +---------+---------------+---------+-----------+----------+--------------+  PFV      Full                                                         +---------+---------------+---------+-----------+----------+--------------+ POP      Full           Yes      Yes                                 +---------+---------------+---------+-----------+----------+--------------+ PTV      Full                                                        +---------+---------------+---------+-----------+----------+--------------+ PERO     Full                                                        +---------+---------------+---------+-----------+----------+--------------+     Summary: RIGHT: - There is no evidence of deep vein thrombosis in the lower extremity.  - No cystic structure found in the popliteal fossa.   *See table(s) above for measurements and observations. Electronically signed by Lemar Livings MD on 10/01/2023 at 4:46:43 PM.    Final    VAS [place] LOWER EXTREMITY VENOUS (DVT) (ONLY [facility] & [facility]) Result Date: 09/30/2023  Lower Venous DVT Study Patient Name:  ARYANNA SHAVER  Date of Exam:   09/30/2023 Medical Rec #: [id number]          Accession #:    [id number] Date of Birth: 1960-12-05         Patient Gender: F Patient Age:   79 years Exam Location:  [facility] Procedure:      VAS [place] LOWER EXTREMITY VENOUS (DVT) Referring Phys: Gloris Manchester --------------------------------------------------------------------------------  Indications: Pain.  Risk Factors: Past pregnancy. Limitations: Poor ultrasound/tissue interface. Comparison Study: None. Performing Technologist: Shona Simpson  Examination Guidelines: A complete evaluation includes B-mode imaging, spectral Doppler, color Doppler, and power Doppler as needed of all accessible portions of each vessel. Bilateral testing is considered an integral part of a complete examination. Limited examinations for reoccurring indications may be performed as noted. The reflux portion of the exam is performed with the patient in reverse Trendelenburg.   +-----+---------------+---------+-----------+----------+--------------+ RIGHTCompressibilityPhasicitySpontaneityPropertiesThrombus Aging +-----+---------------+---------+-----------+----------+--------------+ CFV  Full           Yes      Yes                                 +-----+---------------+---------+-----------+----------+--------------+   +--------+---------------+---------+-----------+----------------+-------------+ LEFT    CompressibilityPhasicitySpontaneityProperties      Thrombus  Aging         +--------+---------------+---------+-----------+----------------+-------------+ CFV     Full           Yes      Yes                                      +--------+---------------+---------+-----------+----------------+-------------+ SFJ     Full                                                             +--------+---------------+---------+-----------+----------------+-------------+ FV Prox Full                                                             +--------+---------------+---------+-----------+----------------+-------------+ FV Mid  Full                                                             +--------+---------------+---------+-----------+----------------+-------------+ FV      Full                    Yes                                      Distal                                                                   +--------+---------------+---------+-----------+----------------+-------------+ PFV     Full                                                             +--------+---------------+---------+-----------+----------------+-------------+ POP     Full           Yes      Yes                                      +--------+---------------+---------+-----------+----------------+-------------+ PTV     Full                                                              +--------+---------------+---------+-----------+----------------+-------------+ PERO    Full                                                             +--------+---------------+---------+-----------+----------------+-------------+  SSV     None           No       No         brightly        Acute                                                    echogenic                     +--------+---------------+---------+-----------+----------------+-------------+     Summary: RIGHT: - No evidence of common femoral vein obstruction.   LEFT: - Findings consistent with acute superficial vein thrombosis involving the left small saphenous vein.  - No cystic structure found in the popliteal fossa.  *See table(s) above for measurements and observations. Electronically signed by Sherald Hess MD on 09/30/2023 at 5:42:49 PM.    Final    CT Angio Chest PE W and/or Wo Contrast Result Date: 09/30/2023 CLINICAL DATA:  Productive cough, lower extremity edema, short of breath for 2 days EXAM: CT ANGIOGRAPHY CHEST WITH CONTRAST TECHNIQUE: Multidetector CT imaging of the chest was performed using the standard protocol during bolus administration of intravenous contrast. Multiplanar CT image reconstructions and MIPs were obtained to evaluate the vascular anatomy. RADIATION DOSE REDUCTION: This exam was performed according to the departmental dose-optimization program which includes automated exposure control, adjustment of the mA and/or kV according to patient size and/or use of iterative reconstruction technique. CONTRAST:  75mL OMNIPAQUE IOHEXOL 350 MG/ML SOLN COMPARISON:  09/12/2023, 09/30/2023 FINDINGS: Cardiovascular: This is a technically adequate evaluation of the pulmonary vasculature. There are segmental right lower lobe pulmonary emboli identified, new since prior study. Clot burden is minimal. No evidence of right heart strain. Stable cardiomegaly with prominent left ventricular dilatation.  Multi lead pacemaker unchanged. Normal caliber of the thoracic aorta. Stable atherosclerosis of the aorta and coronary vasculature. Mediastinum/Nodes: Stable borderline enlarged mediastinal and hilar lymph nodes, likely reactive. Thyroid, trachea, and esophagus are unremarkable. Lungs/Pleura: Since the prior CT, there has been significant improvement in the bilateral patchy airspace disease, with residual faint bilateral ground-glass airspace disease remaining most pronounced in the right lower lobe. Linear areas of consolidation at the left lung base likely reflect subsegmental atelectasis or scarring, and are stable. No effusion or pneumothorax. The central airways are patent. Upper Abdomen: No acute abnormality. Musculoskeletal: No acute or destructive bony abnormalities. Reconstructed images demonstrate no additional findings. Review of the MIP images confirms the above findings. IMPRESSION: 1. Right lower lobe segmental pulmonary emboli, new since prior study. Minimal clot burden with no evidence of right heart strain. 2. Marked improvement in the bilateral airspace disease seen previously, with residual faint ground-glass opacities most pronounced in the right lower lobe compatible with resolving pneumonia. 3. Stable borderline enlarged mediastinal and hilar lymph nodes, consistent with reactive adenopathy. 4. Stable cardiomegaly. 5. Aortic Atherosclerosis (ICD10-I70.0). Coronary artery atherosclerosis. Critical Value/emergent results were called by telephone at the time of interpretation on 09/30/2023 at 4:59 p.m. to provider Gloris Manchester , who verbally acknowledged these results. Electronically Signed   By: Sharlet Salina M.D.   On: 09/30/2023 17:02   DG Chest 2 View Result Date: 09/30/2023 CLINICAL DATA:  Shortness of breath. EXAM: CHEST - 2 VIEW COMPARISON:  Chest radiograph dated 09/12/2023. FINDINGS: Stable cardiomegaly. Stable left chest wall cardiac device  in place. Aortic atherosclerosis. Improved  bilateral multifocal airspace disease with residual airspace opacities in the right mid and lower lung zones and lingula. No pleural effusion or pneumothorax. No acute osseous abnormality. IMPRESSION: Improved bilateral multifocal airspace disease, with residual airspace opacities in the right mid and lower lung zones and lingula. These findings could reflect pulmonary edema or pneumonia. Electronically Signed   By: Hart Robinsons M.D.   On: 09/30/2023 15:23   CT Angio Chest PE W and/or Wo Contrast Result Date: 09/12/2023 CLINICAL DATA:  Recent admission for cough, fatigue, and body aches. Pulmonary embolism suspected, high probability. EXAM: CT ANGIOGRAPHY CHEST WITH CONTRAST TECHNIQUE: Multidetector CT imaging of the chest was performed using the standard protocol during bolus administration of intravenous contrast. Multiplanar CT image reconstructions and MIPs were obtained to evaluate the vascular anatomy. RADIATION DOSE REDUCTION: This exam was performed according to the departmental dose-optimization program which includes automated exposure control, adjustment of the mA and/or kV according to patient size and/or use of iterative reconstruction technique. CONTRAST:  75mL OMNIPAQUE IOHEXOL 350 MG/ML SOLN COMPARISON:  Preceding chest radiograph. FINDINGS: Cardiovascular: Satisfactory opacification of the pulmonary arteries to the segmental level. No evidence of pulmonary embolism. Enlarged heart size with biventricular pacer in stable position by report preceding radiograph. No significant pericardial effusion. There is atheromatous calcification of the aorta and left main. Enlarged main pulmonary artery at 3.6 cm. This usually reflects pulmonary hypertension. Mediastinum/Nodes: Mild thickening of mediastinal lymph nodes which is likely reactive in this setting. Lungs/Pleura: Bilateral airspace disease affecting all lobes. There is some interlobular septal thickening and alveolar edema is considered but  history and patchy distribution is more suggestive of pneumonia. Upper Abdomen: Atheromatous calcification. Musculoskeletal: Thoracic spine degeneration. Review of the MIP images confirms the above findings. IMPRESSION: 1. Multi lobar pneumonia. 2. No pulmonary embolism. 3. Chronic marked cardiomegaly. Electronically Signed   By: Tiburcio Pea M.D.   On: 09/12/2023 06:24   DG Chest 2 View Result Date: 09/12/2023 CLINICAL DATA:  Shortness of breath and cough EXAM: CHEST - 2 VIEW COMPARISON:  08/23/2023 FINDINGS: Bilateral airspace opacity similar to prior. Cardiopericardial enlargement with stable positioning of biventricular pacer from the left. No visible effusion or pneumothorax. IMPRESSION: Bilateral airspace disease similar to comparison last month, focally dense appearance favoring pneumonia over alveolar edema. Chronic cardiomegaly. Electronically Signed   By: Tiburcio Pea M.D.   On: 09/12/2023 04:49    Labs: BNP (last 3 results) Recent Labs    09/14/23 0351 09/30/23 1229 10/02/23 1123  BNP 481.1* 690.8* 473.8*   Basic Metabolic Panel: Recent Labs  Lab 09/30/23 1229 10/02/23 1123  NA 139 140  K 4.0 3.7  CL 107 102  CO2 22 28  GLUCOSE 96 162*  BUN 19 22  CREATININE 0.91 1.09*  CALCIUM 9.1 9.5   Liver Function Tests: Recent Labs  Lab 09/30/23 1229  AST 23  ALT 27  ALKPHOS 55  BILITOT 0.6  PROT 7.2  ALBUMIN 3.9   No results for input(s): "LIPASE", "AMYLASE" in the last 168 hours. No results for input(s): "AMMONIA" in the last 168 hours. CBC: Recent Labs  Lab 09/30/23 1229 10/02/23 1123  WBC 5.7 3.5*  NEUTROABS 4.2  --   HGB 9.9* 11.5*  HCT 32.2* 37.9  MCV 90.4 91.5  PLT 263 260   No results for input(s): "VITAMINB12", "FOLATE", "FERRITIN", "TIBC", "IRON", "RETICCTPCT" in the last 72 hours. Urinalysis    Component Value Date/Time   COLORURINE YELLOW 09/12/2023 1633  APPEARANCEUR CLEAR 09/12/2023 1633   LABSPEC 1.034 (H) 09/12/2023 1633   PHURINE  5.0 09/12/2023 1633   GLUCOSEU NEGATIVE 09/12/2023 1633   HGBUR NEGATIVE 09/12/2023 1633   BILIRUBINUR NEGATIVE 09/12/2023 1633   BILIRUBINUR negative 12/07/2015 1226   KETONESUR NEGATIVE 09/12/2023 1633   PROTEINUR NEGATIVE 09/12/2023 1633   UROBILINOGEN 0.2 12/07/2015 1226   NITRITE NEGATIVE 09/12/2023 1633   LEUKOCYTESUR SMALL (A) 09/12/2023 1633   Sepsis Labs Recent Labs  Lab 09/30/23 1229 10/02/23 1123  WBC 5.7 3.5*   Microbiology Recent Results (from the past 240 hours)  Resp panel by RT-PCR (RSV, Flu A&B, Covid) Anterior Nasal Swab     Status: None   Collection Time: 09/30/23 12:29 PM   Specimen: Anterior Nasal Swab  Result Value Ref Range Status   SARS Coronavirus 2 by RT PCR NEGATIVE NEGATIVE Final    Comment: (NOTE) SARS-CoV-2 target nucleic acids are NOT DETECTED.  The SARS-CoV-2 RNA is generally detectable in upper respiratory specimens during the acute phase of infection. The lowest concentration of SARS-CoV-2 viral copies this assay can detect is 138 copies/mL. A negative result does not preclude SARS-Cov-2 infection and should not be used as the sole basis for treatment or other patient management decisions. A negative result may occur with  improper specimen collection/handling, submission of specimen other than nasopharyngeal swab, presence of viral mutation(s) within the areas targeted by this assay, and inadequate number of viral copies(<138 copies/mL). A negative result must be combined with clinical observations, patient history, and epidemiological information. The expected result is Negative.  Fact Sheet for Patients:  BloggerCourse.com  Fact Sheet for Healthcare Providers:  SeriousBroker.it  This test is no t yet approved or cleared by the [place] FDA and  has been authorized for detection and/or diagnosis of SARS-CoV-2 by FDA under an Emergency Use Authorization (EUA). This EUA will remain   in effect (meaning this test can be used) for the duration of the COVID-19 declaration under Section 564(b)(1) of the Act, 21 U.S.C.section 360bbb-3(b)(1), unless the authorization is terminated  or revoked sooner.       Influenza A by PCR NEGATIVE NEGATIVE Final   Influenza B by PCR NEGATIVE NEGATIVE Final    Comment: (NOTE) The Xpert Xpress SARS-CoV-2/FLU/RSV plus assay is intended as an aid in the diagnosis of influenza from Nasopharyngeal swab specimens and should not be used as a sole basis for treatment. Nasal washings and aspirates are unacceptable for Xpert Xpress SARS-CoV-2/FLU/RSV testing.  Fact Sheet for Patients: BloggerCourse.com  Fact Sheet for Healthcare Providers: SeriousBroker.it  This test is not yet approved or cleared by the [place] FDA and has been authorized for detection and/or diagnosis of SARS-CoV-2 by FDA under an Emergency Use Authorization (EUA). This EUA will remain in effect (meaning this test can be used) for the duration of the COVID-19 declaration under Section 564(b)(1) of the Act, 21 U.S.C. section 360bbb-3(b)(1), unless the authorization is terminated or revoked.     Resp Syncytial Virus by PCR NEGATIVE NEGATIVE Final    Comment: (NOTE) Fact Sheet for Patients: BloggerCourse.com  Fact Sheet for Healthcare Providers: SeriousBroker.it  This test is not yet approved or cleared by the [place] FDA and has been authorized for detection and/or diagnosis of SARS-CoV-2 by FDA under an Emergency Use Authorization (EUA). This EUA will remain in effect (meaning this test can be used) for the duration of the COVID-19 declaration under Section 564(b)(1) of the Act, 21 U.S.C. section 360bbb-3(b)(1), unless the authorization is  terminated or revoked.  Performed at [facility], [address] [place], [place]  [address]     Time coordinating discharge: 25 minutes  SIGNED: Lanae Boast, MD  [facility] 10/02/2023, 5:27 PM  If 7PM-7AM, please contact night-coverage [email or web address]

## 2023-10-02 NOTE — Progress Notes (Signed)
 Discharge instructions reviewed with patient and spouse. All questions answered. All belongings accounted for. Patient to follow up with MD in  1 weeks. Patient medications sent to [facility] [address]. Called and verified that eloquis starter pack was available and would be filled.   Marland Kitchen PIV removed. Assisted via WC to private vehicle.

## 2023-10-03 ENCOUNTER — Ambulatory Visit: Admitting: Physical Therapy

## 2023-10-06 NOTE — Progress Notes (Signed)
 I agree with the above plan

## 2023-10-07 ENCOUNTER — Other Ambulatory Visit: Payer: Self-pay

## 2023-10-07 ENCOUNTER — Emergency Department (HOSPITAL_COMMUNITY)

## 2023-10-07 ENCOUNTER — Inpatient Hospital Stay (HOSPITAL_COMMUNITY)
Admission: EM | Admit: 2023-10-07 | Discharge: 2023-10-15 | DRG: 286 | Disposition: A | Discharge status: Home / Self Care | Attending: Internal Medicine | Admitting: Internal Medicine

## 2023-10-07 ENCOUNTER — Ambulatory Visit: Admitting: Physical Therapy

## 2023-10-07 DIAGNOSIS — Z7901 Long term (current) use of anticoagulants: Secondary | ICD-10-CM

## 2023-10-07 DIAGNOSIS — E785 Hyperlipidemia, unspecified: Secondary | ICD-10-CM | POA: Diagnosis present

## 2023-10-07 DIAGNOSIS — Z87891 Personal history of nicotine dependence: Secondary | ICD-10-CM

## 2023-10-07 DIAGNOSIS — I2510 Atherosclerotic heart disease of native coronary artery without angina pectoris: Secondary | ICD-10-CM | POA: Diagnosis present

## 2023-10-07 DIAGNOSIS — Z86711 Personal history of pulmonary embolism: Secondary | ICD-10-CM | POA: Diagnosis present

## 2023-10-07 DIAGNOSIS — E119 Type 2 diabetes mellitus without complications: Secondary | ICD-10-CM

## 2023-10-07 DIAGNOSIS — Z803 Family history of malignant neoplasm of breast: Secondary | ICD-10-CM

## 2023-10-07 DIAGNOSIS — R0602 Shortness of breath: Secondary | ICD-10-CM | POA: Diagnosis present

## 2023-10-07 DIAGNOSIS — Y95 Nosocomial condition: Secondary | ICD-10-CM | POA: Diagnosis present

## 2023-10-07 DIAGNOSIS — Z8249 Family history of ischemic heart disease and other diseases of the circulatory system: Secondary | ICD-10-CM

## 2023-10-07 DIAGNOSIS — Z87442 Personal history of urinary calculi: Secondary | ICD-10-CM

## 2023-10-07 DIAGNOSIS — J440 Chronic obstructive pulmonary disease with acute lower respiratory infection: Secondary | ICD-10-CM | POA: Diagnosis present

## 2023-10-07 DIAGNOSIS — Z888 Allergy status to other drugs, medicaments and biological substances status: Secondary | ICD-10-CM

## 2023-10-07 DIAGNOSIS — J189 Pneumonia, unspecified organism: Secondary | ICD-10-CM | POA: Diagnosis present

## 2023-10-07 DIAGNOSIS — R531 Weakness: Secondary | ICD-10-CM

## 2023-10-07 DIAGNOSIS — R54 Age-related physical debility: Secondary | ICD-10-CM | POA: Diagnosis present

## 2023-10-07 DIAGNOSIS — I82812 Embolism and thrombosis of superficial veins of left lower extremities: Secondary | ICD-10-CM | POA: Diagnosis present

## 2023-10-07 DIAGNOSIS — Z8042 Family history of malignant neoplasm of prostate: Secondary | ICD-10-CM

## 2023-10-07 DIAGNOSIS — Z90710 Acquired absence of both cervix and uterus: Secondary | ICD-10-CM | POA: Diagnosis not present

## 2023-10-07 DIAGNOSIS — Z905 Acquired absence of kidney: Secondary | ICD-10-CM

## 2023-10-07 DIAGNOSIS — I428 Other cardiomyopathies: Secondary | ICD-10-CM | POA: Diagnosis present

## 2023-10-07 DIAGNOSIS — Z1152 Encounter for screening for COVID-19: Secondary | ICD-10-CM | POA: Diagnosis not present

## 2023-10-07 DIAGNOSIS — Z833 Family history of diabetes mellitus: Secondary | ICD-10-CM

## 2023-10-07 DIAGNOSIS — I071 Rheumatic tricuspid insufficiency: Secondary | ICD-10-CM | POA: Diagnosis present

## 2023-10-07 DIAGNOSIS — Z79899 Other long term (current) drug therapy: Secondary | ICD-10-CM

## 2023-10-07 DIAGNOSIS — Z8701 Personal history of pneumonia (recurrent): Secondary | ICD-10-CM

## 2023-10-07 DIAGNOSIS — Z860101 Personal history of adenomatous and serrated colon polyps: Secondary | ICD-10-CM

## 2023-10-07 DIAGNOSIS — Z8673 Personal history of transient ischemic attack (TIA), and cerebral infarction without residual deficits: Secondary | ICD-10-CM

## 2023-10-07 DIAGNOSIS — I252 Old myocardial infarction: Secondary | ICD-10-CM

## 2023-10-07 DIAGNOSIS — Z9049 Acquired absence of other specified parts of digestive tract: Secondary | ICD-10-CM

## 2023-10-07 DIAGNOSIS — E782 Mixed hyperlipidemia: Secondary | ICD-10-CM

## 2023-10-07 DIAGNOSIS — Z881 Allergy status to other antibiotic agents status: Secondary | ICD-10-CM | POA: Diagnosis not present

## 2023-10-07 DIAGNOSIS — I110 Hypertensive heart disease with heart failure: Secondary | ICD-10-CM | POA: Diagnosis present

## 2023-10-07 DIAGNOSIS — I2699 Other pulmonary embolism without acute cor pulmonale: Secondary | ICD-10-CM | POA: Diagnosis present

## 2023-10-07 DIAGNOSIS — I5043 Acute on chronic combined systolic (congestive) and diastolic (congestive) heart failure: Secondary | ICD-10-CM | POA: Diagnosis present

## 2023-10-07 DIAGNOSIS — Z91040 Latex allergy status: Secondary | ICD-10-CM

## 2023-10-07 DIAGNOSIS — Z8269 Family history of other diseases of the musculoskeletal system and connective tissue: Secondary | ICD-10-CM

## 2023-10-07 DIAGNOSIS — I471 Supraventricular tachycardia, unspecified: Secondary | ICD-10-CM | POA: Diagnosis present

## 2023-10-07 DIAGNOSIS — Z95810 Presence of automatic (implantable) cardiac defibrillator: Secondary | ICD-10-CM

## 2023-10-07 DIAGNOSIS — D638 Anemia in other chronic diseases classified elsewhere: Secondary | ICD-10-CM | POA: Diagnosis present

## 2023-10-07 DIAGNOSIS — I509 Heart failure, unspecified: Secondary | ICD-10-CM

## 2023-10-07 DIAGNOSIS — Z882 Allergy status to sulfonamides status: Secondary | ICD-10-CM

## 2023-10-07 DIAGNOSIS — Z7984 Long term (current) use of oral hypoglycemic drugs: Secondary | ICD-10-CM

## 2023-10-07 DIAGNOSIS — I447 Left bundle-branch block, unspecified: Secondary | ICD-10-CM | POA: Diagnosis present

## 2023-10-07 LAB — I-STAT CG4 LACTIC ACID, ED: Lactic Acid, Venous: 0.7 mmol/L (ref 0.5–1.9)

## 2023-10-07 LAB — CBC WITH DIFFERENTIAL/PLATELET
Abs Immature Granulocytes: 0.01 10*3/uL (ref 0.00–0.07)
Basophils Absolute: 0 10*3/uL (ref 0.0–0.1)
Basophils Relative: 0 %
Eosinophils Absolute: 0.1 10*3/uL (ref 0.0–0.5)
Eosinophils Relative: 1 %
HCT: 32 % — ABNORMAL LOW (ref 36.0–46.0)
Hemoglobin: 9.8 g/dL — ABNORMAL LOW (ref 12.0–15.0)
Immature Granulocytes: 0 %
Lymphocytes Relative: 21 %
Lymphs Abs: 1.1 10*3/uL (ref 0.7–4.0)
MCH: 27.7 pg (ref 26.0–34.0)
MCHC: 30.6 g/dL (ref 30.0–36.0)
MCV: 90.4 fL (ref 80.0–100.0)
Monocytes Absolute: 0.4 10*3/uL (ref 0.1–1.0)
Monocytes Relative: 7 %
Neutro Abs: 3.7 10*3/uL (ref 1.7–7.7)
Neutrophils Relative %: 71 %
Platelets: 230 10*3/uL (ref 150–400)
RBC: 3.54 MIL/uL — ABNORMAL LOW (ref 3.87–5.11)
RDW: 15.4 % (ref 11.5–15.5)
WBC: 5.2 10*3/uL (ref 4.0–10.5)
nRBC: 0 % (ref 0.0–0.2)

## 2023-10-07 LAB — BASIC METABOLIC PANEL WITH GFR
Anion gap: 11 (ref 5–15)
BUN: 16 mg/dL (ref 8–23)
CO2: 23 mmol/L (ref 22–32)
Calcium: 9.2 mg/dL (ref 8.9–10.3)
Chloride: 106 mmol/L (ref 98–111)
Creatinine, Ser: 0.96 mg/dL (ref 0.44–1.00)
GFR, Estimated: >60 mL/min (ref >60)
Glucose, Bld: 118 mg/dL — ABNORMAL HIGH (ref 70–99)
Potassium: 4 mmol/L (ref 3.5–5.1)
Sodium: 140 mmol/L (ref 135–145)

## 2023-10-07 LAB — BRAIN NATRIURETIC PEPTIDE: B Natriuretic Peptide: 916.5 pg/mL — ABNORMAL HIGH (ref 0.0–100.0)

## 2023-10-07 MED ORDER — DOXYCYCLINE HYCLATE 100 MG PO TABS
100.0000 mg | ORAL_TABLET | Freq: Once | ORAL | Status: AC
  Administered 2023-10-07: 100 mg via ORAL
  Filled 2023-10-07: qty 1

## 2023-10-07 MED ORDER — IOHEXOL 350 MG/ML SOLN
75.0000 mL | Freq: Once | INTRAVENOUS | Status: AC | PRN
  Administered 2023-10-07: 75 mL via INTRAVENOUS

## 2023-10-07 MED ORDER — ONDANSETRON HCL 4 MG/2ML IJ SOLN
4.0000 mg | Freq: Once | INTRAMUSCULAR | Status: AC
  Administered 2023-10-07: 4 mg via INTRAVENOUS
  Filled 2023-10-07: qty 2

## 2023-10-07 MED ORDER — FUROSEMIDE 10 MG/ML IJ SOLN
40.0000 mg | Freq: Once | INTRAMUSCULAR | Status: AC
  Administered 2023-10-07: 40 mg via INTRAVENOUS
  Filled 2023-10-07: qty 4

## 2023-10-07 MED ORDER — SODIUM CHLORIDE 0.9 % IV SOLN
1.0000 g | Freq: Once | INTRAVENOUS | Status: AC
  Administered 2023-10-07: 1 g via INTRAVENOUS
  Filled 2023-10-07: qty 10

## 2023-10-07 MED ORDER — ALBUTEROL SULFATE HFA 108 (90 BASE) MCG/ACT IN AERS: 2.0000 | INHALATION_SPRAY | RESPIRATORY_TRACT | Status: DC | PRN

## 2023-10-07 NOTE — ED Notes (Signed)
Pt used bedside commode.

## 2023-10-07 NOTE — ED Notes (Signed)
 Pt taken to CT at this time.

## 2023-10-07 NOTE — ED Notes (Signed)
 Pt exhibiting labored breathing, Pt began to when brought back from CT Tripod. SPO2 98% on room air. Pt complaining of SOB. Placed pt on 2L via nasal cannula. Pt breathing became more regular, Pt sat back in bed SPO2 99% on 2L via nasal cannula, Medications given (SEE MAR) call light within reach.

## 2023-10-07 NOTE — ED Notes (Signed)
 IV team messaged. Pt in room awaiting IV team.

## 2023-10-07 NOTE — ED Notes (Signed)
 Pt back from CT

## 2023-10-07 NOTE — ED Triage Notes (Signed)
 Pt states that she's been having worsening SOB since being discharged from the hospital last week. Had f/u appt today and per pt she had x-ray which revealed PNA. Pt denies CP, no recent fevers per pt.

## 2023-10-07 NOTE — H&P (Signed)
 History and Physical      Lynn Estrada:[id number] DOB: [birth year] DOA: 10/07/2023; DOS: 10/07/2023  PCP: Porfirio Oar, PA *** Patient coming from: home ***  I have personally briefly reviewed patient's old medical records in [facility]  Chief Complaint: ***  HPI: Lynn Estrada is a 63 y.o. female with medical history significant for *** who is admitted to [facility] on 10/07/2023 with *** after presenting from home*** to [facility] ED complaining of ***.    ***       ***   ED Course:  Vital signs in the ED were notable for the following: ***  Labs were notable for the following: ***  Per my interpretation, EKG in ED demonstrated the following:  ***  Imaging in the ED, per corresponding formal radiology read, was notable for the following:  ***  While in the ED, the following were administered: ***  Subsequently, the patient was admitted  ***  ***red    Review of Systems: As per HPI otherwise 10 point review of systems negative.   Past Medical History:  Diagnosis Date   Anemia    Arthritis    CHF (congestive heart failure) (HCC) 12/06/2022   EF is 20% to 25 %   COPD (chronic obstructive pulmonary disease) (HCC)    Diabetes mellitus without complication (HCC)    Eczema    GERD (gastroesophageal reflux disease)    History of adenomatous polyp of colon    08-03-2016  tubular adenoma x2 and hyperplastic   History of chronic gastritis 08/03/2016   History of ectopic pregnancy 1988   s/p  left salpingectomy   History of endometriosis    History of kidney stones    History of partial nephrectomy    right pelvis for very large stone   History of pneumonia 07/02/2016   CAP   History of sepsis    07-01-2016  sepsis w/ pyelonephritis, CAP /   12-31-2016  urosepsis w/ kidney stone obstruction   History of uterine leiomyoma    Hx of adenomatous colonic polyps 08/03/2016   2 adenomas, 1 hpp and 1 lost polyp 2018 all < 1 cm    Hyperlipidemia     Hypertension    Left ureteral stone    Myocardial infarction [facility])    Nephrolithiasis    left obstructive stone and right non-obstructive stone  per CT 12-31-2016   Urgency of urination     Past Surgical History:  Procedure Laterality Date   ABDOMINAL HYSTERECTOMY  01/20/2007   w/ Lysis Adhesions/  Left salpingoophorectomy/  Right Salpingectomy   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   Procedure: BIV ICD INSERTION CRT-D;  Surgeon: Maurice Small, MD;  [facility]: [facility];  Service: Cardiovascular;  Laterality: N/A;   CHOLECYSTECTOMY  1994   and Right Partial Nephrectomy (pelvis for very large stone)   COLONOSCOPY WITH PROPOFOL N/A 03/26/2023   Procedure: COLONOSCOPY WITH PROPOFOL;  Surgeon: Iva Boop, MD;  Location: [facility];  Service: Gastroenterology;  Laterality: N/A;   CYSTOSCOPY W/ URETERAL STENT PLACEMENT Left 12/31/2016   Procedure: CYSTOSCOPY WITH RETROGRADE PYELOGRAM/ LEFT URETERAL STENT PLACEMENT;  Surgeon: Sebastian Ache, MD;  Location: [facility];  Service: Urology;  Laterality: Left;   CYSTOSCOPY WITH RETROGRADE PYELOGRAM, URETEROSCOPY AND STENT PLACEMENT Left 01/18/2017   Procedure: 1ST STAGE CYSTOSCOPY WITH RETROGRADE PYELOGRAM, URETEROSCOPY AND STENT REPLACEMENT;  Surgeon: Sebastian Ache, MD;  Location: [facility];  Service: Urology;  Laterality: Left;  CYSTOSCOPY WITH RETROGRADE PYELOGRAM, URETEROSCOPY AND STENT PLACEMENT Left 02/01/2017   Procedure: 2ND STAGE CYSTOSCOPY WITH RETROGRADE PYELOGRAM, URETEROSCOPY AND STENT REPLACEMENT;  Surgeon: Sebastian Ache, MD;  Location: [facility];  Service: Urology;  Laterality: Left;   ECTOPIC PREGNANCY SURGERY  1988   Left Salpingectomy   HOLMIUM LASER APPLICATION Left 01/18/2017   Procedure: HOLMIUM LASER APPLICATION;  Surgeon: Sebastian Ache, MD;  [facility];  Service: Urology;  Laterality: Left;   HOLMIUM LASER APPLICATION Left 02/01/2017   Procedure:  HOLMIUM LASER APPLICATION;  Surgeon: Sebastian Ache, MD;  Location: [facility];  Service: Urology;  Laterality: Left;   LUMBAR DISC SURGERY  01/1999   right L5 -- S1   POLYPECTOMY  03/26/2023   Procedure: POLYPECTOMY;  Surgeon: Iva Boop, MD;  Location: [facility];  Service: Gastroenterology;;   RE-EXPLORATION LUMBAR/  LAMINECTOMY AND MICRODISKECTOMY  10/14/2001   right L5 -- S1   RIGHT HEART CATH N/A 05/23/2022   Procedure: RIGHT HEART CATH;  Surgeon: Dolores Patty, MD;  [facility]: [facility];  Service: Cardiovascular;  Laterality: N/A;   RIGHT/LEFT HEART CATH AND CORONARY ANGIOGRAPHY N/A 07/18/2021   Procedure: RIGHT/LEFT HEART CATH AND CORONARY ANGIOGRAPHY;  Surgeon: Dolores Patty, MD;  Location: [facility];  Service: Cardiovascular;  Laterality: N/A;   TUBAL LIGATION Bilateral 10/17/1999   PPTL   UMBILICAL HERNIA REPAIR  age 70    Social History:  reports that she quit smoking about 26 years ago. Her smoking use included cigarettes. She started smoking about 31 years ago. She has a 3.8 pack-year smoking history. She has never used smokeless tobacco. She reports that she does not currently use alcohol. She reports that she does not use drugs.   Allergies  Allergen Reactions   Latex Rash and Hives   Bactrim [Sulfamethoxazole-Trimethoprim] Rash   Carvedilol Itching   Metoprolol Itching and Rash    Family History  Problem Relation Age of Onset   Sarcoidosis Mother    Prostate cancer Father    Hypertension Sister    Eczema Sister    Lupus Sister    Breast cancer Maternal Aunt    Gout Maternal Grandmother    Diabetes Maternal Grandmother        leg amputations   Stomach cancer Neg Hx    Colon cancer Neg Hx    Esophageal cancer Neg Hx    Rectal cancer Neg Hx     Family history reviewed and not pertinent ***   Prior to Admission medications   Medication Sig Start Date End Date Taking? Authorizing Provider  acetaminophen  (TYLENOL) 500 MG tablet Take 500-1,000 mg by mouth every 6 (six) hours as needed (for pain.).    [provider]  APIXABAN Everlene Balls) VTE STARTER PACK (10MG  AND 5MG ) Take as directed on package: start with two-5mg  tablets twice daily till 10/07/2023 AM then from 10/07/2023 evening take one-5mg  tab twice daily. 10/02/23   Lanae Boast, MD  atorvastatin (LIPITOR) 10 MG tablet Take 10 mg by mouth in the morning. 05/15/21   [provider]  benzonatate (TESSALON) 200 MG capsule Take 1 capsule (200 mg total) by mouth 3 (three) times daily as needed for cough (not relieved with tussionex). 09/17/23   [place], Alvira Philips, DO  bisoprolol (ZEBETA) 5 MG tablet Take 0.5 tablets (2.5 mg total) by mouth daily. 07/19/23   Bensimhon, Bevelyn Buckles, MD  Calcium Carbonate Antacid (TUMS E-X PO) Take 1 tablet  by mouth daily.    [provider]  chlorpheniramine-HYDROcodone (TUSSIONEX) 10-8 MG/5ML Take 5 mLs by mouth every 12 (twelve) hours as needed for cough. 09/17/23   [place], Alvira Philips, DO  digoxin (LANOXIN) 0.125 MG tablet Take 1 tablet (125 mcg total) by mouth daily. 09/03/23   Bensimhon, Bevelyn Buckles, MD  furosemide (LASIX) 20 MG tablet Take 1 tablet (20 mg total) by mouth daily as needed. 10/02/23 11/01/23  Lanae Boast, MD  JARDIANCE 10 MG TABS tablet Take 10 mg by mouth daily. 09/07/23   [provider]  midodrine (PROAMATINE) 5 MG tablet Take 1 tablet (5 mg total) by mouth 3 (three) times daily with meals. 09/17/23 12/16/23  [place], Eric J, DO  Multiple Vitamins-Minerals (ONE-A-DAY WOMENS PO) Take 1 tablet by mouth daily with breakfast.    [provider]  ondansetron (ZOFRAN-ODT) 4 MG disintegrating tablet Take 4 mg by mouth every 8 (eight) hours as needed for nausea or vomiting (dissolve orally).    [provider]  spironolactone (ALDACTONE) 25 MG tablet Take 1 tablet (25 mg total) by mouth daily. 09/18/23 12/17/23  [place], Eric J, DO     Objective    Physical Exam: Vitals:    10/07/23 2227 10/07/23 2248 10/07/23 2300 10/07/23 2330  BP:  116/77 106/72 108/71  Pulse:  91 64   Resp:  (!) 23 20 (!) 22  Temp: 97.8 F (36.6 C)     TempSrc: Oral     SpO2:  97% 91% 97%  Weight:      Height:        General: appears to be stated age; alert, oriented Skin: warm, dry, no rash Head:  AT/[place] Mouth:  Oral mucosa membranes appear moist, normal dentition Neck: supple; trachea midline Heart:  RRR; did not appreciate any M/R/G Lungs: CTAB, did not appreciate any wheezes, rales, or rhonchi Abdomen: + BS; soft, ND, NT Vascular: 2+ pedal pulses b/l; 2+ radial pulses b/l Extremities: no peripheral edema, no muscle wasting Neuro: strength and sensation intact in upper and lower extremities b/l ***   *** Neuro: 5/5 strength of the proximal and distal flexors and extensors of the upper and lower extremities bilaterally; sensation intact in upper and lower extremities b/l; cranial nerves II through XII grossly intact; no pronator drift; no evidence suggestive of slurred speech, dysarthria, or facial droop; Normal muscle tone. No tremors.  *** Neuro: In the setting of the patient's current mental status and associated inability to follow instructions, unable to perform full neurologic exam at this time.  As such, assessment of strength, sensation, and cranial nerves is limited at this time. Patient noted to spontaneously move all 4 extremities. No tremors.  ***    Labs on Admission: I have personally reviewed following labs and imaging studies  CBC: Recent Labs  Lab 10/02/23 1123 10/07/23 1441  WBC 3.5* 5.2  NEUTROABS  --  3.7  HGB 11.5* 9.8*  HCT 37.9 32.0*  MCV 91.5 90.4  PLT 260 230   Basic Metabolic Panel: Recent Labs  Lab 10/02/23 1123 10/07/23 1441  NA 140 140  K 3.7 4.0  CL 102 106  CO2 28 23  GLUCOSE 162* 118*  BUN 22 16  CREATININE 1.09* 0.96  CALCIUM 9.5 9.2   GFR: Estimated Creatinine Clearance: 73.3 mL/min (by C-G formula based on SCr of  0.96 mg/dL). Liver Function Tests: No results for input(s): "AST", "ALT", "ALKPHOS", "BILITOT", "PROT", "ALBUMIN" in the last 168 hours. No results for input(s): "LIPASE", "  AMYLASE" in the last 168 hours. No results for input(s): "AMMONIA" in the last 168 hours. Coagulation Profile: No results for input(s): "INR", "PROTIME" in the last 168 hours. Cardiac Enzymes: No results for input(s): "CKTOTAL", "CKMB", "CKMBINDEX", "TROPONINI" in the last 168 hours. BNP (last 3 results) No results for input(s): "PROBNP" in the last 8760 hours. HbA1C: No results for input(s): "HGBA1C" in the last 72 hours. CBG: No results for input(s): "GLUCAP" in the last 168 hours. Lipid Profile: No results for input(s): "CHOL", "HDL", "LDLCALC", "TRIG", "CHOLHDL", "LDLDIRECT" in the last 72 hours. Thyroid Function Tests: No results for input(s): "TSH", "T4TOTAL", "FREET4", "T3FREE", "THYROIDAB" in the last 72 hours. Anemia Panel: No results for input(s): "VITAMINB12", "FOLATE", "FERRITIN", "TIBC", "IRON", "RETICCTPCT" in the last 72 hours. Urine analysis:    Component Value Date/Time   COLORURINE YELLOW 09/12/2023 1633   APPEARANCEUR CLEAR 09/12/2023 1633   LABSPEC 1.034 (H) 09/12/2023 1633   PHURINE 5.0 09/12/2023 1633   GLUCOSEU NEGATIVE 09/12/2023 1633   HGBUR NEGATIVE 09/12/2023 1633   BILIRUBINUR NEGATIVE 09/12/2023 1633   BILIRUBINUR negative 12/07/2015 1226   KETONESUR NEGATIVE 09/12/2023 1633   PROTEINUR NEGATIVE 09/12/2023 1633   UROBILINOGEN 0.2 12/07/2015 1226   NITRITE NEGATIVE 09/12/2023 1633   LEUKOCYTESUR SMALL (A) 09/12/2023 1633    Radiological Exams on Admission: CT ABDOMEN PELVIS W CONTRAST Result Date: 10/07/2023 CLINICAL DATA:  Abdominal pain. EXAM: CT ABDOMEN AND PELVIS WITH CONTRAST TECHNIQUE: Multidetector CT imaging of the abdomen and pelvis was performed using the standard protocol following bolus administration of intravenous contrast. RADIATION DOSE REDUCTION: This exam was  performed according to the departmental dose-optimization program which includes automated exposure control, adjustment of the mA and/or kV according to patient size and/or use of iterative reconstruction technique. CONTRAST:  75mL OMNIPAQUE IOHEXOL 350 MG/ML SOLN COMPARISON:  August 04, 2023 FINDINGS: Lower chest: Marked severity right lower lobe infiltrate is seen. Moderate severity lingular and left lower lobe linear atelectasis is also noted. There is a small right pleural effusion. Mild to moderate severity areas of intraluminal low attenuation are seen involving multiple lower lobe branches of the right pulmonary artery (axial CT images 1 through 7, CT series 14). Hepatobiliary: No focal liver abnormality is seen. No gallstones, gallbladder wall thickening, or biliary dilatation. Pancreas: Unremarkable. No pancreatic ductal dilatation or surrounding inflammatory changes. Spleen: Normal in size without focal abnormality. Adrenals/Urinary Tract: Adrenal glands are unremarkable. Kidneys are normal in size, without obstructing renal calculi, focal lesion, or hydronephrosis. Numerous bilateral subcentimeter non-obstructing renal calculi are seen. Bladder is unremarkable. Stomach/Bowel: Stomach is within normal limits. Appendix appears normal. No evidence of bowel wall thickening, distention, or inflammatory changes. Vascular/Lymphatic: Aortic atherosclerosis. No enlarged abdominal or pelvic lymph nodes. Reproductive: Status post hysterectomy. No adnexal masses. Other: No abdominal wall hernia or abnormality. No abdominopelvic ascites. Musculoskeletal: Moderate to marked severity degenerative changes are seen at the level of L5-S1. IMPRESSION: 1. Marked severity right lower lobe infiltrate with a small right pleural effusion. 2. Mild to moderate severity known right lower lobe pulmonary embolism. 3. Numerous bilateral subcentimeter non-obstructing renal calculi. 4. Moderate to marked severity degenerative changes  at the level of L5-S1. 5. Aortic atherosclerosis. Aortic Atherosclerosis (ICD10-I70.0). Electronically Signed   By: Aram Candela M.D.   On: 10/07/2023 22:54   CT Angio Chest PE W and/or Wo Contrast Result Date: 10/07/2023 CLINICAL DATA:  Pulmonary embolism (PE) suspected, high prob Shortness of breath. EXAM: CT ANGIOGRAPHY CHEST WITH CONTRAST TECHNIQUE: Multidetector CT imaging of the chest  was performed using the standard protocol during bolus administration of intravenous contrast. Multiplanar CT image reconstructions and MIPs were obtained to evaluate the vascular anatomy. RADIATION DOSE REDUCTION: This exam was performed according to the departmental dose-optimization program which includes automated exposure control, adjustment of the mA and/or kV according to patient size and/or use of iterative reconstruction technique. CONTRAST:  75mL OMNIPAQUE IOHEXOL 350 MG/ML SOLN COMPARISON:  Radiograph earlier today.  Chest CTA 1 week ago FINDINGS: Cardiovascular: The known right lower lobe pulmonary emboli have diminished, with mild residual thrombus in the segmental/subsegmental branches series 5, image 73, extending distally. Overall improved thromboembolic burden from prior exam. No new or progressive pulmonary emboli. Aortic atherosclerosis. The heart is enlarged, particularly the left atrium and ventricle. Pacemaker again seen. Trace pericardial effusion. Mediastinum/Nodes: Unchanged shotty mediastinal and bilateral hilar lymph nodes. Patulous esophagus. No thyroid nodule. Lungs/Pleura: Progressive ground-glass and consolidative opacity in the right lower lobe. Diffuse heterogeneous pulmonary parenchyma with areas of smooth septal thickening. Moderate bronchial thickening. Trace right pleural effusion, increased. Upper Abdomen: No acute findings. Musculoskeletal: No acute findings.  Stable thoracic spondylosis. Review of the MIP images confirms the above findings. IMPRESSION: 1. The known right lower lobe  pulmonary emboli have diminished over the last week, with mild residual thrombus in the segmental/subsegmental branches. Overall improved thromboembolic burden from prior exam. No new or progressive pulmonary emboli. 2. Progressive ground-glass and consolidative opacity in the right lower lobe, favor worsening pneumonia. 3. Diffuse heterogeneous pulmonary parenchyma with areas of smooth septal thickening, query pulmonary edema. Trace right pleural effusion, increased. 4. Cardiomegaly. Aortic Atherosclerosis (ICD10-I70.0). Electronically Signed   By: Narda Rutherford M.D.   On: 10/07/2023 21:32   DG Chest 2 View Result Date: 10/07/2023 CLINICAL DATA:  Shortness of breath. EXAM: CHEST - 2 VIEW COMPARISON:  Chest radiograph dated 09/30/2023. FINDINGS: Stable cardiomegaly. Stable left chest wall cardiac device in place. Aortic atherosclerosis. Increased bibasilar interstitial opacities, more pronounced on the right. Mild bibasilar atelectasis. No sizable pleural effusion or pneumothorax. No acute osseous abnormality. IMPRESSION: 1. Increased bibasilar interstitial opacities, more pronounced on the right, could reflect asymmetric pulmonary edema or infectious/inflammatory process. 2. Stable cardiomegaly. Electronically Signed   By: Hart Robinsons M.D.   On: 10/07/2023 16:12      Assessment/Plan   Principal Problem:   HCAP (healthcare-associated pneumonia)   ***            ***                  ***                   ***                  ***                  ***                  ***                   ***                  ***                  ***                  ***                  ***                 ***                ***  DVT prophylaxis: SCD's ***  Code Status: Full  code*** Family Communication: none*** Disposition Plan: Per Rounding Team Consults called: none***;  Admission status: ***     I SPENT GREATER THAN 75 *** MINUTES IN CLINICAL CARE TIME/MEDICAL DECISION-MAKING IN COMPLETING THIS ADMISSION.      Chaney Born Muhamad Serano DO [facility]  From 7PM - 7AM   10/07/2023, 11:51 PM   ***

## 2023-10-07 NOTE — ED Notes (Signed)
Patient ambulated to bathroom by RN.

## 2023-10-07 NOTE — ED Provider Notes (Signed)
 [place] EMERGENCY DEPARTMENT AT [facility] Provider Note   CSN: [id number] Arrival date & time: 10/07/23  1431     History  Chief Complaint  Patient presents with   Shortness of Breath    Lynn Estrada is a 63 y.o. female.   Shortness of Breath Associated symptoms: abdominal pain and cough   Patient presents for shortness of breath.  Medical history includes CHF, COPD, type 2 diabetes, GERD, hypertension, hyperlipidemia, CAD.  She has had multiple hospital admissions over the past 2 months for pneumonia.  Most recently, she was admitted a week ago for shortness of breath.  At that time, she was found to have pulmonary embolism.  She was discharged from the hospital 5 days ago on Eliquis.  Other home medications include midodrine, Jardiance, digoxin, spironolactone, and Lasix as needed.  When she left the hospital 5 days ago, she felt well.  She has been taking 20 mg of Lasix daily.  She has been adherent to her Eliquis.  Over the past 5 days, she has had worsening shortness of breath, exercise intolerance, orthopnea, worsening cough, and swelling in her lower extremities.  She underwent outpatient chest x-ray today which showed concern of pneumonia recurrence.  She was advised to come to the ED.  Patient denies any recent chest pain.  She has had some mid abdominal pain.  Because of this, she has not been eating.     Home Medications Prior to Admission medications   Medication Sig Start Date End Date Taking? Authorizing Provider  acetaminophen (TYLENOL) 500 MG tablet Take 500-1,000 mg by mouth every 6 (six) hours as needed (for pain.).    [provider]  APIXABAN Everlene Balls) VTE STARTER PACK (10MG  AND 5MG ) Take as directed on package: start with two-5mg  tablets twice daily till 10/07/2023 AM then from 10/07/2023 evening take one-5mg  tab twice daily. 10/02/23   Lanae Boast, MD  atorvastatin (LIPITOR) 10 MG tablet Take 10 mg by mouth in the morning. 05/15/21   [provider]  benzonatate (TESSALON) 200 MG capsule Take 1 capsule (200 mg total) by mouth 3 (three) times daily as needed for cough (not relieved with tussionex). 09/17/23   [place], Alvira Philips, DO  bisoprolol (ZEBETA) 5 MG tablet Take 0.5 tablets (2.5 mg total) by mouth daily. 07/19/23   Bensimhon, Bevelyn Buckles, MD  Calcium Carbonate Antacid (TUMS E-X PO) Take 1 tablet by mouth daily.    [provider]  chlorpheniramine-HYDROcodone (TUSSIONEX) 10-8 MG/5ML Take 5 mLs by mouth every 12 (twelve) hours as needed for cough. 09/17/23   [place], Alvira Philips, DO  digoxin (LANOXIN) 0.125 MG tablet Take 1 tablet (125 mcg total) by mouth daily. 09/03/23   Bensimhon, Bevelyn Buckles, MD  furosemide (LASIX) 20 MG tablet Take 1 tablet (20 mg total) by mouth daily as needed. 10/02/23 11/01/23  Lanae Boast, MD  JARDIANCE 10 MG TABS tablet Take 10 mg by mouth daily. 09/07/23   [provider]  midodrine (PROAMATINE) 5 MG tablet Take 1 tablet (5 mg total) by mouth 3 (three) times daily with meals. 09/17/23 12/16/23  [place], Eric J, DO  Multiple Vitamins-Minerals (ONE-A-DAY WOMENS PO) Take 1 tablet by mouth daily with breakfast.    [provider]  ondansetron (ZOFRAN-ODT) 4 MG disintegrating tablet Take 4 mg by mouth every 8 (eight) hours as needed for nausea or vomiting (dissolve orally).    [provider]  spironolactone (ALDACTONE) 25 MG tablet Take 1 tablet (25 mg total)  by mouth daily. 09/18/23 12/17/23  [place], Eric J, DO      Allergies    Latex, Bactrim [sulfamethoxazole-trimethoprim], Carvedilol, and Metoprolol    Review of Systems   Review of Systems  Constitutional:  Positive for activity change, appetite change and fatigue.  Respiratory:  Positive for cough and shortness of breath.   Cardiovascular:  Positive for leg swelling.  Gastrointestinal:  Positive for abdominal pain.  All other systems reviewed and are negative.   Physical Exam Updated Vital Signs BP 116/77   Pulse 91   Temp  97.8 F (36.6 C) (Oral)   Resp (!) 23   Ht 6\' 1"  (1.854 m)   Wt 86.2 kg   SpO2 97%   BMI 25.07 kg/m  Physical Exam Vitals and nursing note reviewed.  Constitutional:      General: She is not in acute distress.    Appearance: She is well-developed. She is not ill-appearing, toxic-appearing or diaphoretic.  HENT:     Head: Normocephalic and atraumatic.     Mouth/Throat:     Mouth: Mucous membranes are moist.  Eyes:     Conjunctiva/sclera: Conjunctivae normal.  Cardiovascular:     Rate and Rhythm: Normal rate and regular rhythm.     Heart sounds: No murmur heard. Pulmonary:     Effort: Tachypnea and accessory muscle usage present. No respiratory distress.     Breath sounds: Normal breath sounds. No wheezing or rhonchi.  Chest:     Chest wall: No tenderness.  Abdominal:     Palpations: Abdomen is soft.     Tenderness: There is abdominal tenderness. There is no guarding or rebound.  Musculoskeletal:        General: No swelling.     Cervical back: Neck supple.     Right lower leg: Edema present.     Left lower leg: Edema present.  Skin:    General: Skin is warm and dry.     Coloration: Skin is not cyanotic or pale.  Neurological:     General: No focal deficit present.     Mental Status: She is alert and oriented to person, place, and time.  Psychiatric:        Mood and Affect: Mood normal.        Behavior: Behavior normal.     ED Results / Procedures / Treatments   Labs (all labs ordered are listed, but only abnormal results are displayed) Labs Reviewed  BASIC METABOLIC PANEL WITH GFR - Abnormal; Notable for the following components:      Result Value   Glucose, Bld 118 (*)    All other components within normal limits  CBC WITH DIFFERENTIAL/PLATELET - Abnormal; Notable for the following components:   RBC 3.54 (*)    Hemoglobin 9.8 (*)    HCT 32.0 (*)    All other components within normal limits  BRAIN NATRIURETIC PEPTIDE - Abnormal; Notable for the following  components:   B Natriuretic Peptide 916.5 (*)    All other components within normal limits  I-STAT CG4 LACTIC ACID, ED  I-STAT CG4 LACTIC ACID, ED    EKG EKG Interpretation Date/Time:  Monday October 07 2023 14:48:35 EDT Ventricular Rate:  78 PR Interval:  128 QRS Duration:  148 QT Interval:  244 QTC Calculation: 278 R Axis:   258  Text Interpretation: Atrial-sensed ventricular-paced rhythm Confirmed by Gloris Manchester [phone]) on 10/07/2023 5:50:55 PM  Radiology CT ABDOMEN PELVIS W CONTRAST Result Date: 10/07/2023 CLINICAL DATA:  Abdominal pain.  EXAM: CT ABDOMEN AND PELVIS WITH CONTRAST TECHNIQUE: Multidetector CT imaging of the abdomen and pelvis was performed using the standard protocol following bolus administration of intravenous contrast. RADIATION DOSE REDUCTION: This exam was performed according to the departmental dose-optimization program which includes automated exposure control, adjustment of the mA and/or kV according to patient size and/or use of iterative reconstruction technique. CONTRAST:  75mL OMNIPAQUE IOHEXOL 350 MG/ML SOLN COMPARISON:  August 04, 2023 FINDINGS: Lower chest: Marked severity right lower lobe infiltrate is seen. Moderate severity lingular and left lower lobe linear atelectasis is also noted. There is a small right pleural effusion. Mild to moderate severity areas of intraluminal low attenuation are seen involving multiple lower lobe branches of the right pulmonary artery (axial CT images 1 through 7, CT series 14). Hepatobiliary: No focal liver abnormality is seen. No gallstones, gallbladder wall thickening, or biliary dilatation. Pancreas: Unremarkable. No pancreatic ductal dilatation or surrounding inflammatory changes. Spleen: Normal in size without focal abnormality. Adrenals/Urinary Tract: Adrenal glands are unremarkable. Kidneys are normal in size, without obstructing renal calculi, focal lesion, or hydronephrosis. Numerous bilateral subcentimeter non-obstructing  renal calculi are seen. Bladder is unremarkable. Stomach/Bowel: Stomach is within normal limits. Appendix appears normal. No evidence of bowel wall thickening, distention, or inflammatory changes. Vascular/Lymphatic: Aortic atherosclerosis. No enlarged abdominal or pelvic lymph nodes. Reproductive: Status post hysterectomy. No adnexal masses. Other: No abdominal wall hernia or abnormality. No abdominopelvic ascites. Musculoskeletal: Moderate to marked severity degenerative changes are seen at the level of L5-S1. IMPRESSION: 1. Marked severity right lower lobe infiltrate with a small right pleural effusion. 2. Mild to moderate severity known right lower lobe pulmonary embolism. 3. Numerous bilateral subcentimeter non-obstructing renal calculi. 4. Moderate to marked severity degenerative changes at the level of L5-S1. 5. Aortic atherosclerosis. Aortic Atherosclerosis (ICD10-I70.0). Electronically Signed   By: Aram Candela M.D.   On: 10/07/2023 22:54   CT Angio Chest PE W and/or Wo Contrast Result Date: 10/07/2023 CLINICAL DATA:  Pulmonary embolism (PE) suspected, high prob Shortness of breath. EXAM: CT ANGIOGRAPHY CHEST WITH CONTRAST TECHNIQUE: Multidetector CT imaging of the chest was performed using the standard protocol during bolus administration of intravenous contrast. Multiplanar CT image reconstructions and MIPs were obtained to evaluate the vascular anatomy. RADIATION DOSE REDUCTION: This exam was performed according to the departmental dose-optimization program which includes automated exposure control, adjustment of the mA and/or kV according to patient size and/or use of iterative reconstruction technique. CONTRAST:  75mL OMNIPAQUE IOHEXOL 350 MG/ML SOLN COMPARISON:  Radiograph earlier today.  Chest CTA 1 week ago FINDINGS: Cardiovascular: The known right lower lobe pulmonary emboli have diminished, with mild residual thrombus in the segmental/subsegmental branches series 5, image 73, extending  distally. Overall improved thromboembolic burden from prior exam. No new or progressive pulmonary emboli. Aortic atherosclerosis. The heart is enlarged, particularly the left atrium and ventricle. Pacemaker again seen. Trace pericardial effusion. Mediastinum/Nodes: Unchanged shotty mediastinal and bilateral hilar lymph nodes. Patulous esophagus. No thyroid nodule. Lungs/Pleura: Progressive ground-glass and consolidative opacity in the right lower lobe. Diffuse heterogeneous pulmonary parenchyma with areas of smooth septal thickening. Moderate bronchial thickening. Trace right pleural effusion, increased. Upper Abdomen: No acute findings. Musculoskeletal: No acute findings.  Stable thoracic spondylosis. Review of the MIP images confirms the above findings. IMPRESSION: 1. The known right lower lobe pulmonary emboli have diminished over the last week, with mild residual thrombus in the segmental/subsegmental branches. Overall improved thromboembolic burden from prior exam. No new or progressive pulmonary emboli. 2. Progressive ground-glass and  consolidative opacity in the right lower lobe, favor worsening pneumonia. 3. Diffuse heterogeneous pulmonary parenchyma with areas of smooth septal thickening, query pulmonary edema. Trace right pleural effusion, increased. 4. Cardiomegaly. Aortic Atherosclerosis (ICD10-I70.0). Electronically Signed   By: Narda Rutherford M.D.   On: 10/07/2023 21:32   DG Chest 2 View Result Date: 10/07/2023 CLINICAL DATA:  Shortness of breath. EXAM: CHEST - 2 VIEW COMPARISON:  Chest radiograph dated 09/30/2023. FINDINGS: Stable cardiomegaly. Stable left chest wall cardiac device in place. Aortic atherosclerosis. Increased bibasilar interstitial opacities, more pronounced on the right. Mild bibasilar atelectasis. No sizable pleural effusion or pneumothorax. No acute osseous abnormality. IMPRESSION: 1. Increased bibasilar interstitial opacities, more pronounced on the right, could reflect  asymmetric pulmonary edema or infectious/inflammatory process. 2. Stable cardiomegaly. Electronically Signed   By: Hart Robinsons M.D.   On: 10/07/2023 16:12    Procedures Procedures    Medications Ordered in ED Medications  albuterol (VENTOLIN HFA) 108 (90 Base) MCG/ACT inhaler 2 puff (has no administration in time range)  furosemide (LASIX) injection 40 mg (40 mg Intravenous Given 10/07/23 2046)  cefTRIAXone (ROCEPHIN) 1 g in sodium chloride 0.9 % 100 mL IVPB (0 g Intravenous Stopped 10/07/23 2122)  doxycycline (VIBRA-TABS) tablet 100 mg (100 mg Oral Given 10/07/23 1830)  iohexol (OMNIPAQUE) 350 MG/ML injection 75 mL (75 mLs Intravenous Contrast Given 10/07/23 2029)  ondansetron (ZOFRAN) injection 4 mg (4 mg Intravenous Given 10/07/23 2121)    ED Course/ Medical Decision Making/ A&P                                 Medical Decision Making Amount and/or Complexity of Data Reviewed Labs: ordered. Radiology: ordered.  Risk Prescription drug management. Decision regarding hospitalization.   This patient presents to the ED for concern of shortness of breath, this involves an extensive number of treatment options, and is a complaint that carries with it a high risk of complications and morbidity.  The differential diagnosis includes CHF exacerbation, reactive airway disease, pneumonia, worsening PE, acidosis, anemia, other metabolic derangements   Co morbidities that complicate the patient evaluation  CHF, COPD, type 2 diabetes, GERD, hypertension, hyperlipidemia, CAD   Additional history obtained:  Additional history obtained from N/A External records from outside source obtained and reviewed including EMR   Lab Tests:  I Ordered, and personally interpreted labs.  The pertinent results include: Baseline anemia, no leukocytosis, normal kidney function, normal electrolytes.  BNP elevated.   Imaging Studies ordered:  I ordered imaging studies including chest x-ray, CTA chest, CT  of abdomen and pelvis I independently visualized and interpreted imaging which showed progressive groundglass and consolidative opacity in right lower lobe consistent with pneumonia; questionable pulmonary edema; trace right pleural effusion; no acute intra-abdominal findings. I agree with the radiologist interpretation   Cardiac Monitoring: / EKG:  The patient was maintained on a cardiac monitor.  I personally viewed and interpreted the cardiac monitored which showed an underlying rhythm of: Sinus rhythm   Problem List / ED Course / Critical interventions / Medication management  Patient presenting for worsening shortness of breath over the past 5 days.  Has had recent admissions for pneumonia and PE.  Currently on Eliquis and as needed Lasix, which she has been taking.  On arrival in the ED, she is tachypneic with increased work of breathing.  Her SpO2 is currently 100% on room air.  Although she is able to speak in  complete sentences, she does have worsening dyspnea with conversation.  Her abdomen is soft but she does have some generalized tenderness.  Initial workup is notable for elevated BNP.  IV Lasix was ordered.  Given x-ray findings, will also treat empirically for pneumonia.  Antibiotics were ordered.  CT imaging ordered.  CT imaging shows redemonstration of right lower lobe pneumonia.  Given her increased work of breathing, patient to be admitted for further management. I ordered medication including Lasix for diuresis; ceftriaxone and doxycycline for pneumonia; Zofran for nausea Reevaluation of the patient after these medicines showed that the patient improved I have reviewed the patients home medicines and have made adjustments as needed  Social Determinants of Health:  Frequent hospitalizations        Final Clinical Impression(s) / ED Diagnoses Final diagnoses:  Pneumonia of right lower lobe due to infectious organism  Acute on chronic congestive heart failure,  unspecified heart failure type [facility])    Rx / DC Orders ED Discharge Orders     None         Gloris Manchester, MD 10/07/23 2319

## 2023-10-08 ENCOUNTER — Encounter (HOSPITAL_COMMUNITY): Payer: Self-pay | Admitting: Internal Medicine

## 2023-10-08 DIAGNOSIS — Z86711 Personal history of pulmonary embolism: Secondary | ICD-10-CM | POA: Diagnosis present

## 2023-10-08 DIAGNOSIS — D638 Anemia in other chronic diseases classified elsewhere: Secondary | ICD-10-CM | POA: Diagnosis present

## 2023-10-08 DIAGNOSIS — R0602 Shortness of breath: Secondary | ICD-10-CM | POA: Diagnosis present

## 2023-10-08 DIAGNOSIS — J189 Pneumonia, unspecified organism: Secondary | ICD-10-CM | POA: Diagnosis not present

## 2023-10-08 DIAGNOSIS — E119 Type 2 diabetes mellitus without complications: Secondary | ICD-10-CM

## 2023-10-08 LAB — CBC WITH DIFFERENTIAL/PLATELET
Abs Immature Granulocytes: 0.03 10*3/uL (ref 0.00–0.07)
Basophils Absolute: 0 10*3/uL (ref 0.0–0.1)
Basophils Relative: 0 %
Eosinophils Absolute: 0.1 10*3/uL (ref 0.0–0.5)
Eosinophils Relative: 2 %
HCT: 30.4 % — ABNORMAL LOW (ref 36.0–46.0)
Hemoglobin: 9.3 g/dL — ABNORMAL LOW (ref 12.0–15.0)
Immature Granulocytes: 1 %
Lymphocytes Relative: 19 %
Lymphs Abs: 0.9 10*3/uL (ref 0.7–4.0)
MCH: 27.5 pg (ref 26.0–34.0)
MCHC: 30.6 g/dL (ref 30.0–36.0)
MCV: 89.9 fL (ref 80.0–100.0)
Monocytes Absolute: 0.4 10*3/uL (ref 0.1–1.0)
Monocytes Relative: 9 %
Neutro Abs: 3.3 10*3/uL (ref 1.7–7.7)
Neutrophils Relative %: 69 %
Platelets: 206 10*3/uL (ref 150–400)
RBC: 3.38 MIL/uL — ABNORMAL LOW (ref 3.87–5.11)
RDW: 15.6 % — ABNORMAL HIGH (ref 11.5–15.5)
WBC: 4.8 10*3/uL (ref 4.0–10.5)
nRBC: 0 % (ref 0.0–0.2)

## 2023-10-08 LAB — RESPIRATORY PANEL BY PCR

## 2023-10-08 LAB — COMPREHENSIVE METABOLIC PANEL WITH GFR
ALT: 30 U/L (ref 0–44)
AST: 22 U/L (ref 15–41)
Albumin: 3.4 g/dL — ABNORMAL LOW (ref 3.5–5.0)
Alkaline Phosphatase: 44 U/L (ref 38–126)
Anion gap: 11 (ref 5–15)
BUN: 15 mg/dL (ref 8–23)
CO2: 26 mmol/L (ref 22–32)
Calcium: 8.9 mg/dL (ref 8.9–10.3)
Chloride: 103 mmol/L (ref 98–111)
Creatinine, Ser: 1.04 mg/dL — ABNORMAL HIGH (ref 0.44–1.00)
GFR, Estimated: >60 mL/min (ref >60)
Glucose, Bld: 100 mg/dL — ABNORMAL HIGH (ref 70–99)
Potassium: 3.6 mmol/L (ref 3.5–5.1)
Sodium: 140 mmol/L (ref 135–145)
Total Bilirubin: 1.3 mg/dL — ABNORMAL HIGH (ref 0.0–1.2)
Total Protein: 6.5 g/dL (ref 6.5–8.1)

## 2023-10-08 LAB — PHOSPHORUS: Phosphorus: 4.3 mg/dL (ref 2.5–4.6)

## 2023-10-08 LAB — CBG MONITORING, ED
Glucose-Capillary: 91 mg/dL (ref 70–99)
Glucose-Capillary: 93 mg/dL (ref 70–99)

## 2023-10-08 LAB — GLUCOSE, CAPILLARY
Glucose-Capillary: 109 mg/dL — ABNORMAL HIGH (ref 70–99)
Glucose-Capillary: 121 mg/dL — ABNORMAL HIGH (ref 70–99)

## 2023-10-08 LAB — PROCALCITONIN: Procalcitonin: <0.1 ng/mL

## 2023-10-08 LAB — MAGNESIUM: Magnesium: 2 mg/dL (ref 1.7–2.4)

## 2023-10-08 MED ORDER — METOPROLOL TARTRATE 5 MG/5ML IV SOLN: 5.0000 mg | INTRAVENOUS | Status: DC | PRN

## 2023-10-08 MED ORDER — BISOPROLOL FUMARATE 5 MG PO TABS
2.5000 mg | ORAL_TABLET | Freq: Every day | ORAL | Status: DC
  Administered 2023-10-08 – 2023-10-15 (×8): 2.5 mg via ORAL
  Filled 2023-10-08 (×5): qty 1
  Filled 2023-10-08: qty 0.5
  Filled 2023-10-08 (×2): qty 1

## 2023-10-08 MED ORDER — REVEFENACIN 175 MCG/3ML IN SOLN
175.0000 ug | Freq: Every day | RESPIRATORY_TRACT | Status: DC
  Administered 2023-10-08 – 2023-10-09 (×2): 175 ug via RESPIRATORY_TRACT
  Filled 2023-10-08 (×2): qty 3

## 2023-10-08 MED ORDER — ONDANSETRON HCL 4 MG/2ML IJ SOLN: 4.0000 mg | Freq: Four times a day (QID) | INTRAMUSCULAR | Status: DC | PRN

## 2023-10-08 MED ORDER — TRAZODONE HCL 50 MG PO TABS: 50.0000 mg | ORAL_TABLET | Freq: Every evening | ORAL | Status: DC | PRN

## 2023-10-08 MED ORDER — ATORVASTATIN CALCIUM 10 MG PO TABS
10.0000 mg | ORAL_TABLET | Freq: Every morning | ORAL | Status: DC
Start: 2023-10-08 — End: 2023-10-15
  Administered 2023-10-08 – 2023-10-15 (×8): 10 mg via ORAL
  Filled 2023-10-08 (×8): qty 1

## 2023-10-08 MED ORDER — SPIRONOLACTONE 25 MG PO TABS
25.0000 mg | ORAL_TABLET | Freq: Every day | ORAL | Status: DC
  Administered 2023-10-08 – 2023-10-15 (×8): 25 mg via ORAL
  Filled 2023-10-08 (×8): qty 1

## 2023-10-08 MED ORDER — VANCOMYCIN HCL 2000 MG/400ML IV SOLN
2000.0000 mg | Freq: Once | INTRAVENOUS | Status: AC
  Administered 2023-10-08: 2000 mg via INTRAVENOUS
  Filled 2023-10-08: qty 400

## 2023-10-08 MED ORDER — MIDODRINE HCL 5 MG PO TABS
5.0000 mg | ORAL_TABLET | Freq: Three times a day (TID) | ORAL | Status: DC
  Administered 2023-10-08 – 2023-10-11 (×10): 5 mg via ORAL
  Filled 2023-10-08 (×10): qty 1

## 2023-10-08 MED ORDER — HYDRALAZINE HCL 20 MG/ML IJ SOLN: 10.0000 mg | INTRAMUSCULAR | Status: DC | PRN

## 2023-10-08 MED ORDER — ARFORMOTEROL TARTRATE 15 MCG/2ML IN NEBU
15.0000 ug | INHALATION_SOLUTION | Freq: Two times a day (BID) | RESPIRATORY_TRACT | Status: DC
  Administered 2023-10-08 – 2023-10-09 (×3): 15 ug via RESPIRATORY_TRACT
  Filled 2023-10-08 (×3): qty 2

## 2023-10-08 MED ORDER — FUROSEMIDE 10 MG/ML IJ SOLN
40.0000 mg | Freq: Two times a day (BID) | INTRAMUSCULAR | Status: DC
  Administered 2023-10-08 – 2023-10-09 (×3): 40 mg via INTRAVENOUS
  Filled 2023-10-08 (×3): qty 4

## 2023-10-08 MED ORDER — INSULIN ASPART 100 UNIT/ML IJ SOLN
0.0000 [IU] | Freq: Three times a day (TID) | INTRAMUSCULAR | Status: DC
  Administered 2023-10-09: 2 [IU] via SUBCUTANEOUS
  Administered 2023-10-09 (×2): 1 [IU] via SUBCUTANEOUS
  Administered 2023-10-10: 2 [IU] via SUBCUTANEOUS
  Administered 2023-10-11 – 2023-10-14 (×3): 1 [IU] via SUBCUTANEOUS

## 2023-10-08 MED ORDER — SODIUM CHLORIDE 0.9 % IV SOLN
2.0000 g | Freq: Three times a day (TID) | INTRAVENOUS | Status: DC
  Administered 2023-10-08 – 2023-10-09 (×4): 2 g via INTRAVENOUS
  Filled 2023-10-08 (×4): qty 12.5

## 2023-10-08 MED ORDER — MELATONIN 3 MG PO TABS
3.0000 mg | ORAL_TABLET | Freq: Every evening | ORAL | Status: DC | PRN
  Filled 2023-10-08: qty 1

## 2023-10-08 MED ORDER — ACETAMINOPHEN 650 MG RE SUPP: 650.0000 mg | Freq: Four times a day (QID) | RECTAL | Status: DC | PRN

## 2023-10-08 MED ORDER — APIXABAN 5 MG PO TABS
5.0000 mg | ORAL_TABLET | Freq: Two times a day (BID) | ORAL | Status: DC
  Administered 2023-10-08 – 2023-10-15 (×16): 5 mg via ORAL
  Filled 2023-10-08 (×16): qty 1

## 2023-10-08 MED ORDER — DIGOXIN 125 MCG PO TABS
125.0000 ug | ORAL_TABLET | Freq: Every day | ORAL | Status: DC
  Administered 2023-10-08 – 2023-10-15 (×8): 125 ug via ORAL
  Filled 2023-10-08 (×8): qty 1

## 2023-10-08 MED ORDER — VANCOMYCIN HCL IN DEXTROSE 1-5 GM/200ML-% IV SOLN
1000.0000 mg | Freq: Two times a day (BID) | INTRAVENOUS | Status: DC
  Administered 2023-10-08 – 2023-10-09 (×2): 1000 mg via INTRAVENOUS
  Filled 2023-10-08 (×3): qty 200

## 2023-10-08 MED ORDER — SENNOSIDES-DOCUSATE SODIUM 8.6-50 MG PO TABS
1.0000 | ORAL_TABLET | Freq: Every evening | ORAL | Status: DC | PRN
  Administered 2023-10-09: 1 via ORAL
  Filled 2023-10-08: qty 1

## 2023-10-08 MED ORDER — BENZONATATE 100 MG PO CAPS
200.0000 mg | ORAL_CAPSULE | Freq: Three times a day (TID) | ORAL | Status: DC | PRN
  Filled 2023-10-08: qty 2

## 2023-10-08 MED ORDER — SODIUM CHLORIDE 0.9 % IV SOLN
2.0000 g | Freq: Three times a day (TID) | INTRAVENOUS | Status: DC
  Administered 2023-10-08: 2 g via INTRAVENOUS
  Filled 2023-10-08: qty 12.5

## 2023-10-08 MED ORDER — POTASSIUM CHLORIDE CRYS ER 20 MEQ PO TBCR
40.0000 meq | EXTENDED_RELEASE_TABLET | Freq: Once | ORAL | Status: AC
  Administered 2023-10-08: 40 meq via ORAL
  Filled 2023-10-08: qty 2

## 2023-10-08 MED ORDER — ACETAMINOPHEN 325 MG PO TABS: 650.0000 mg | ORAL_TABLET | Freq: Four times a day (QID) | ORAL | Status: DC | PRN

## 2023-10-08 NOTE — Progress Notes (Signed)
 Heart Failure Navigator Progress Note  Assessed for Heart & Vascular TOC clinic readiness.  Patient does not meet criteria due to AHF patient of Dr. Gala Romney.   Navigator will sign off.  Sharen Hones, PharmD, BCPS Heart Failure Stewardship Pharmacist Phone [phone]

## 2023-10-08 NOTE — Hospital Course (Addendum)
 Brief Narrative:   63 year old with history of acute PE, systolic CHF EF 25%., DM2, HLD, anemia of chronic disease, admitted to [facility] recently for pneumonia now being admitted again for shortness of breath with concerns of fluid overload recurrent HCAP.  During recent admission patient completed 5 days of cefepime and azithromycin.  CTA chest showed right lower lobe pneumonia, pulmonary edema and effusion.  Assessment & Plan:  Principal Problem:   HCAP (healthcare-associated pneumonia) Active Problems:   HLD (hyperlipidemia)   Acute on chronic combined systolic and diastolic CHF (congestive heart failure) (HCC)   SOB (shortness of breath)   History of pulmonary embolism   DM2 (diabetes mellitus, type 2) (HCC)   Anemia of chronic disease   Acute respiratory distress Healthcare acquired pneumonia - CTA chest-reviewed.  Elevated BNP.  Procalcitonin is negative. - Bronchodilators, I-S/flutter valve - On IV vancomycin and cefepime - COVID, flu, RSV,espiratory panel  Acute congestive heart failure with reduced EF, 25% Status post BiV PPM - Elevated BNP.  Receiving Lasix IV. - Continue bisoprolol, Aldactone, digoxin  History of pulmonary embolism - On Eliquis  Diabetes mellitus type 2 - Accu-Cheks and sliding scale  Hyperlipidemia - Statin  Anemia of chronic disease - Baseline hemoglobin  History of CVA - November 2024.  Now on Eliquis, Plavix have been discontinued since previous admission   DVT prophylaxis:  apixaban (ELIQUIS) tablet 5 mg      Code Status: Full Code Family Communication:   Status is: Inpatient Continue hospital stay for ongoing diuresis    Subjective: After some diuresis overnight she feels a [place] better but still has exertional dyspnea   Examination:  General exam: Appears calm and comfortable  Respiratory system: Diminished breath sounds at the bases Cardiovascular system: S1 & S2 heard, RRR. No JVD, murmurs, rubs, gallops or  clicks. No pedal edema. Gastrointestinal system: Abdomen is nondistended, soft and nontender. No organomegaly or masses felt. Normal bowel sounds heard. Central nervous system: Alert and oriented. No focal neurological deficits. Extremities: Symmetric 5 x 5 power. Skin: No rashes, lesions or ulcers Psychiatry: Judgement and insight appear normal. Mood & affect appropriate.

## 2023-10-08 NOTE — Plan of Care (Signed)
   Problem: Education: Goal: Ability to describe self-care measures that may prevent or decrease complications (Diabetes Survival Skills Education) will improve Outcome: Progressing Goal: Individualized Educational Video(s) Outcome: Progressing

## 2023-10-08 NOTE — Progress Notes (Signed)
 Pharmacy Antibiotic Note  Lynn Estrada is a 63 y.o. female admitted on 10/07/2023 with concern for healthcare-acquired pneumonia. Recent admissions 08/2023 for shortness of breath found to have PNA and PE. Pharmacy has been consulted for cefepime and vancomycin dosing.  Plan: Cefepime 2g q8h Vancomycin 2g x 1 load > vancomycin 1000mg  q12h (eAUC 494, Scr 0.96) F/u renal function, infectious work up and length of therapy Vancomycin levels as needed  Height: 6\' 1"  (185.4 cm) Weight: 86.2 kg (190 lb) IBW/kg (Calculated) : 75.4  Temp (24hrs), Avg:98.1 F (36.7 C), Min:97.8 F (36.6 C), Max:98.4 F (36.9 C)  Recent Labs  Lab 10/02/23 1123 10/07/23 1441 10/07/23 1457  WBC 3.5* 5.2  --   CREATININE 1.09* 0.96  --   LATICACIDVEN  --   --  0.7    Estimated Creatinine Clearance: 73.3 mL/min (by C-G formula based on SCr of 0.96 mg/dL).    Allergies  Allergen Reactions   Latex Rash and Hives   Bactrim [Sulfamethoxazole-Trimethoprim] Rash   Carvedilol Itching   Metoprolol Itching and Rash   Thank you for allowing pharmacy to be a part of this patient's care.  Marja Kays 10/08/2023 1:05 AM

## 2023-10-08 NOTE — TOC Initial Note (Signed)
 Transition of Care [facility]) - Initial/Assessment Note    Patient Details  Name: Lynn Estrada MRN: [id number] Date of Birth: [birth year]  Transition of Care [facility]) CM/SW Contact:    Leone Haven, RN Phone Number: 10/08/2023, 4:21 PM  Clinical Narrative:                 From home with spouse, has PCP and insurance on file, states has no HH services in place at this time , has walker and a cane at home.  States family member will transport them home at [employer] and family is support system, states [facility] [address]  Pta self ambulatory with cane or walker.  Expected Discharge Plan: Home/Self Care Barriers to Discharge: Continued Medical Work up   Patient Goals and CMS Choice Patient states their goals for this hospitalization and ongoing recovery are:: return home   Choice offered to / list presented to : NA      Expected Discharge Plan and Services   Discharge Planning Services: CM Consult Post Acute Care Choice: NA Living arrangements for the past 2 months: Single Family Home                 DME Arranged: N/A DME Agency: NA                  Prior Living Arrangements/Services Living arrangements for the past 2 months: Single Family Home Lives with:: Spouse Patient language and need for interpreter reviewed:: Yes Do you feel safe going back to the place where you live?: Yes      Need for Family Participation in Patient Care: No (Comment) Care giver support system in place?: Yes (comment) Current home services: DME (cane and walker) Criminal Activity/Legal Involvement Pertinent to Current Situation/Hospitalization: No - Comment as needed  Activities of Daily Living   ADL Screening (condition at time of admission) Independently performs ADLs?: Yes (appropriate for developmental age) Is the patient deaf or have difficulty hearing?: No Does the patient have difficulty seeing, even when wearing glasses/contacts?: No Does the patient  have difficulty concentrating, remembering, or making decisions?: No  Permission Sought/Granted Permission sought to share information with : Case Manager Permission granted to share information with : Yes, Verbal Permission Granted              Emotional Assessment Appearance:: Appears stated age Attitude/Demeanor/Rapport: Engaged Affect (typically observed): Appropriate Orientation: : Oriented to Self, Oriented to Place, Oriented to  Time, Oriented to Situation Alcohol / Substance Use: Not Applicable Psych Involvement: No (comment)  Admission diagnosis:  HCAP (healthcare-associated pneumonia) [J18.9] Pneumonia of right lower lobe due to infectious organism [J18.9] Acute on chronic congestive heart failure, unspecified heart failure type [facility]) [I50.9] Patient Active Problem List   Diagnosis Date Noted   SOB (shortness of breath) 10/08/2023   History of pulmonary embolism 10/08/2023   DM2 (diabetes mellitus, type 2) (HCC) 10/08/2023   Anemia of chronic disease 10/08/2023   Aortic atherosclerosis (HCC) 10/01/2023   Atherosclerosis of coronary artery 10/01/2023   Pulmonary emboli (HCC) 09/30/2023   HCAP (healthcare-associated pneumonia) 09/12/2023   Acute on chronic combined systolic and diastolic CHF (congestive heart failure) (HCC) 08/23/2023   Frequency of urination and polyuria 08/23/2023   Cerebrovascular accident (CVA) (HCC) 05/26/2023   Pyuria 05/25/2023   Left-sided weakness 05/24/2023   Epistaxis 04/22/2023   Benign neoplasm of cecum 03/26/2023   Benign neoplasm of descending colon 03/26/2023   Benign neoplasm of sigmoid colon  03/26/2023   Adhesive capsulitis of right shoulder 08/24/2022   Mass of right hand 02/22/2022   LBBB (left bundle branch block) 02/01/2022   Type 2 diabetes mellitus with hyperglycemia, without long-term current use of insulin (HCC) 01/22/2022   HLD (hyperlipidemia) 01/21/2022   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  10/26/2021   Chronic combined systolic and diastolic heart failure (HCC) 07/27/2021   Acute combined systolic (congestive) and diastolic (congestive) heart failure (HCC)    Hypocalcemia 07/12/2021   Elevated troponin 07/12/2021   Lipoma of hand 07/20/2020   Osteoarthritis of carpometacarpal (CMC) joint of thumb 07/20/2020   Pain in right hand 07/20/2020   Radial styloid tenosynovitis 07/20/2020   Other osteoarthritis of spine 10/23/2019   Suspected COVID-19 virus infection 06/11/2019   Acute respiratory failure with hypoxia (HCC) 06/11/2019   Multifocal pneumonia 06/11/2019   Carpal tunnel syndrome, bilateral 04/24/2019   Ureteral stone with hydronephrosis 12/31/2016   Hx of adenomatous colonic polyps 08/03/2016   Normocytic anemia 07/10/2016   CAP (community acquired pneumonia) 07/06/2016   Essential hypertension 07/26/2015   Eczema 07/26/2015   Overweight (BMI 25.0-29.9) 07/26/2015   PCP:  Porfirio Oar, PA Pharmacy:   [phone] [address], [place] [address] [place] [address] Phone: [phone] Fax: [phone]  [facility] [address] [place] [address] Phone: [phone] Fax: [phone]     Social Drivers of Health (SDOH) Social History: SDOH Screenings   Food Insecurity: No Food Insecurity (10/08/2023)  Housing: Low Risk  (10/08/2023)  Transportation Needs: No Transportation Needs (10/08/2023)  Utilities: Not At Risk (10/08/2023)  Depression (PHQ2-9): Low Risk  (03/16/2022)  Financial Resource Strain: Low Risk  (09/24/2023)   Received from [facility]  Physical Activity: Unknown (09/24/2023)   Received from [facility]  Social Connections: Patient Declined (09/30/2023)  Stress: No Stress Concern Present (09/24/2023)   Received from [facility]  Tobacco Use: Medium Risk (10/08/2023)   SDOH Interventions:     Readmission Risk Interventions    10/08/2023    4:15 PM   Readmission Risk Prevention Plan  Transportation Screening Complete  PCP or Specialist Appt within 3-5 Days Complete  HRI or Home Care Consult Complete  Palliative Care Screening Not Applicable  Medication Review (RN Care Manager) Complete

## 2023-10-08 NOTE — Progress Notes (Signed)
 PROGRESS NOTE    Lynn Estrada  WUJ:[id number] DOB: 1961-02-08 DOA: 10/07/2023 PCP: Porfirio Oar, PA    Brief Narrative:   63 year old with history of acute PE, systolic CHF EF 25%., DM2, HLD, anemia of chronic disease, admitted to [facility] recently for pneumonia now being admitted again for shortness of breath with concerns of fluid overload recurrent HCAP.  During recent admission patient completed 5 days of cefepime and azithromycin.  CTA chest showed right lower lobe pneumonia, pulmonary edema and effusion.  Assessment & Plan:  Principal Problem:   HCAP (healthcare-associated pneumonia) Active Problems:   HLD (hyperlipidemia)   Acute on chronic combined systolic and diastolic CHF (congestive heart failure) (HCC)   SOB (shortness of breath)   History of pulmonary embolism   DM2 (diabetes mellitus, type 2) (HCC)   Anemia of chronic disease   Acute respiratory distress Healthcare acquired pneumonia - CTA chest-reviewed.  Elevated BNP.  Procalcitonin is negative. - Bronchodilators, I-S/flutter valve - On IV vancomycin and cefepime - COVID, flu, RSV,espiratory panel  Acute congestive heart failure with reduced EF, 25% Status post BiV PPM - Elevated BNP.  Receiving Lasix IV. - Continue bisoprolol, Aldactone, digoxin  History of pulmonary embolism - On Eliquis  Diabetes mellitus type 2 - Accu-Cheks and sliding scale  Hyperlipidemia - Statin  Anemia of chronic disease - Baseline hemoglobin  History of CVA - November 2024.  Now on Eliquis, Plavix have been discontinued since previous admission   DVT prophylaxis:  apixaban (ELIQUIS) tablet 5 mg      Code Status: Full Code Family Communication:   Status is: Inpatient Continue hospital stay for ongoing diuresis    Subjective: After some diuresis overnight she feels a Joaquin better but still has exertional dyspnea   Examination:  General exam: Appears calm and comfortable  Respiratory  system: Diminished breath sounds at the bases Cardiovascular system: S1 & S2 heard, RRR. No JVD, murmurs, rubs, gallops or clicks. No pedal edema. Gastrointestinal system: Abdomen is nondistended, soft and nontender. No organomegaly or masses felt. Normal bowel sounds heard. Central nervous system: Alert and oriented. No focal neurological deficits. Extremities: Symmetric 5 x 5 power. Skin: No rashes, lesions or ulcers Psychiatry: Judgement and insight appear normal. Mood & affect appropriate.                Diet Orders (From admission, onward)     Start     Ordered   10/08/23 0048  Diet regular Room service appropriate? Yes; Fluid consistency: Thin  Diet effective now       Question Answer Comment  Room service appropriate? Yes   Fluid consistency: Thin      10/08/23 0048            Objective: Vitals:   10/08/23 0650 10/08/23 0830 10/08/23 1010 10/08/23 1121  BP: 116/82  110/63   Pulse: 77 66 84   Resp: 18  (!) 27   Temp: 97.7 F (36.5 C)   97.7 F (36.5 C)  TempSrc: Oral   Oral  SpO2: 100%  92%   Weight:      Height:        Intake/Output Summary (Last 24 hours) at 10/08/2023 1323 Last data filed at 10/08/2023 0054 Gross per 24 hour  Intake --  Output 1400 ml  Net -1400 ml   Filed Weights   10/07/23 1800  Weight: 86.2 kg    Scheduled Meds:  apixaban  5 mg Oral BID   arformoterol  15 mcg Nebulization BID   atorvastatin  10 mg Oral q AM   bisoprolol  2.5 mg Oral Daily   digoxin  125 mcg Oral Daily   furosemide  40 mg Intravenous BID   insulin aspart  0-9 Units Subcutaneous TID WC   midodrine  5 mg Oral TID WC   revefenacin  175 mcg Nebulization Daily   spironolactone  25 mg Oral Daily   Continuous Infusions:  ceFEPime (MAXIPIME) IV Stopped (10/08/23 0953)   vancomycin      Nutritional status     Body mass index is 25.07 kg/m.  Data Reviewed:   CBC: Recent Labs  Lab 10/02/23 1123 10/07/23 1441 10/08/23 0530  WBC 3.5* 5.2 4.8   NEUTROABS  --  3.7 3.3  HGB 11.5* 9.8* 9.3*  HCT 37.9 32.0* 30.4*  MCV 91.5 90.4 89.9  PLT 260 230 206   Basic Metabolic Panel: Recent Labs  Lab 10/02/23 1123 10/07/23 1441 10/08/23 0530  NA 140 140 140  K 3.7 4.0 3.6  CL 102 106 103  CO2 28 23 26   GLUCOSE 162* 118* 100*  BUN 22 16 15   CREATININE 1.09* 0.96 1.04*  CALCIUM 9.5 9.2 8.9  MG  --   --  2.0  PHOS  --   --  4.3   GFR: Estimated Creatinine Clearance: 67.6 mL/min (A) (by C-G formula based on SCr of 1.04 mg/dL (H)). Liver Function Tests: Recent Labs  Lab 10/08/23 0530  AST 22  ALT 30  ALKPHOS 44  BILITOT 1.3*  PROT 6.5  ALBUMIN 3.4*   No results for input(s): "LIPASE", "AMYLASE" in the last 168 hours. No results for input(s): "AMMONIA" in the last 168 hours. Coagulation Profile: No results for input(s): "INR", "PROTIME" in the last 168 hours. Cardiac Enzymes: No results for input(s): "CKTOTAL", "CKMB", "CKMBINDEX", "TROPONINI" in the last 168 hours. BNP (last 3 results) No results for input(s): "PROBNP" in the last 8760 hours. HbA1C: No results for input(s): "HGBA1C" in the last 72 hours. CBG: Recent Labs  Lab 10/08/23 0851 10/08/23 1244  GLUCAP 91 93   Lipid Profile: No results for input(s): "CHOL", "HDL", "LDLCALC", "TRIG", "CHOLHDL", "LDLDIRECT" in the last 72 hours. Thyroid Function Tests: No results for input(s): "TSH", "T4TOTAL", "FREET4", "T3FREE", "THYROIDAB" in the last 72 hours. Anemia Panel: No results for input(s): "VITAMINB12", "FOLATE", "FERRITIN", "TIBC", "IRON", "RETICCTPCT" in the last 72 hours. Sepsis Labs: Recent Labs  Lab 10/07/23 1457 10/08/23 0530  PROCALCITON  --  <0.10  LATICACIDVEN 0.7  --     Recent Results (from the past 240 hours)  Resp panel by RT-PCR (RSV, Flu A&B, Covid) Anterior Nasal Swab     Status: None   Collection Time: 09/30/23 12:29 PM   Specimen: Anterior Nasal Swab  Result Value Ref Range Status   SARS Coronavirus 2 by RT PCR NEGATIVE NEGATIVE  Final    Comment: (NOTE) SARS-CoV-2 target nucleic acids are NOT DETECTED.  The SARS-CoV-2 RNA is generally detectable in upper respiratory specimens during the acute phase of infection. The lowest concentration of SARS-CoV-2 viral copies this assay can detect is 138 copies/mL. A negative result does not preclude SARS-Cov-2 infection and should not be used as the sole basis for treatment or other patient management decisions. A negative result may occur with  improper specimen collection/handling, submission of specimen other than nasopharyngeal swab, presence of viral mutation(s) within the areas targeted by this assay, and inadequate number of viral copies(<138 copies/mL). A negative result must  be combined with clinical observations, patient history, and epidemiological information. The expected result is Negative.  Fact Sheet for Patients:  BloggerCourse.com  Fact Sheet for Healthcare Providers:  SeriousBroker.it  This test is no t yet approved or cleared by the [place] FDA and  has been authorized for detection and/or diagnosis of SARS-CoV-2 by FDA under an Emergency Use Authorization (EUA). This EUA will remain  in effect (meaning this test can be used) for the duration of the COVID-19 declaration under Section 564(b)(1) of the Act, 21 U.S.C.section 360bbb-3(b)(1), unless the authorization is terminated  or revoked sooner.       Influenza A by PCR NEGATIVE NEGATIVE Final   Influenza B by PCR NEGATIVE NEGATIVE Final    Comment: (NOTE) The Xpert Xpress SARS-CoV-2/FLU/RSV plus assay is intended as an aid in the diagnosis of influenza from Nasopharyngeal swab specimens and should not be used as a sole basis for treatment. Nasal washings and aspirates are unacceptable for Xpert Xpress SARS-CoV-2/FLU/RSV testing.  Fact Sheet for Patients: BloggerCourse.com  Fact Sheet for Healthcare  Providers: SeriousBroker.it  This test is not yet approved or cleared by the [place] FDA and has been authorized for detection and/or diagnosis of SARS-CoV-2 by FDA under an Emergency Use Authorization (EUA). This EUA will remain in effect (meaning this test can be used) for the duration of the COVID-19 declaration under Section 564(b)(1) of the Act, 21 U.S.C. section 360bbb-3(b)(1), unless the authorization is terminated or revoked.     Resp Syncytial Virus by PCR NEGATIVE NEGATIVE Final    Comment: (NOTE) Fact Sheet for Patients: BloggerCourse.com  Fact Sheet for Healthcare Providers: SeriousBroker.it  This test is not yet approved or cleared by the [place] FDA and has been authorized for detection and/or diagnosis of SARS-CoV-2 by FDA under an Emergency Use Authorization (EUA). This EUA will remain in effect (meaning this test can be used) for the duration of the COVID-19 declaration under Section 564(b)(1) of the Act, 21 U.S.C. section 360bbb-3(b)(1), unless the authorization is terminated or revoked.  Performed at [facility], [address] [place], [place] [address]          Radiology Studies: CT ABDOMEN PELVIS W CONTRAST Result Date: 10/07/2023 CLINICAL DATA:  Abdominal pain. EXAM: CT ABDOMEN AND PELVIS WITH CONTRAST TECHNIQUE: Multidetector CT imaging of the abdomen and pelvis was performed using the standard protocol following bolus administration of intravenous contrast. RADIATION DOSE REDUCTION: This exam was performed according to the departmental dose-optimization program which includes automated exposure control, adjustment of the mA and/or kV according to patient size and/or use of iterative reconstruction technique. CONTRAST:  75mL OMNIPAQUE IOHEXOL 350 MG/ML SOLN COMPARISON:  August 04, 2023 FINDINGS: Lower chest: Marked severity right lower lobe  infiltrate is seen. Moderate severity lingular and left lower lobe linear atelectasis is also noted. There is a small right pleural effusion. Mild to moderate severity areas of intraluminal low attenuation are seen involving multiple lower lobe branches of the right pulmonary artery (axial CT images 1 through 7, CT series 14). Hepatobiliary: No focal liver abnormality is seen. No gallstones, gallbladder wall thickening, or biliary dilatation. Pancreas: Unremarkable. No pancreatic ductal dilatation or surrounding inflammatory changes. Spleen: Normal in size without focal abnormality. Adrenals/Urinary Tract: Adrenal glands are unremarkable. Kidneys are normal in size, without obstructing renal calculi, focal lesion, or hydronephrosis. Numerous bilateral subcentimeter non-obstructing renal calculi are seen. Bladder is unremarkable. Stomach/Bowel: Stomach is within normal limits. Appendix appears normal. No evidence of bowel wall  thickening, distention, or inflammatory changes. Vascular/Lymphatic: Aortic atherosclerosis. No enlarged abdominal or pelvic lymph nodes. Reproductive: Status post hysterectomy. No adnexal masses. Other: No abdominal wall hernia or abnormality. No abdominopelvic ascites. Musculoskeletal: Moderate to marked severity degenerative changes are seen at the level of L5-S1. IMPRESSION: 1. Marked severity right lower lobe infiltrate with a small right pleural effusion. 2. Mild to moderate severity known right lower lobe pulmonary embolism. 3. Numerous bilateral subcentimeter non-obstructing renal calculi. 4. Moderate to marked severity degenerative changes at the level of L5-S1. 5. Aortic atherosclerosis. Aortic Atherosclerosis (ICD10-I70.0). Electronically Signed   By: Aram Candela M.D.   On: 10/07/2023 22:54   CT Angio Chest PE W and/or Wo Contrast Result Date: 10/07/2023 CLINICAL DATA:  Pulmonary embolism (PE) suspected, high prob Shortness of breath. EXAM: CT ANGIOGRAPHY CHEST WITH CONTRAST  TECHNIQUE: Multidetector CT imaging of the chest was performed using the standard protocol during bolus administration of intravenous contrast. Multiplanar CT image reconstructions and MIPs were obtained to evaluate the vascular anatomy. RADIATION DOSE REDUCTION: This exam was performed according to the departmental dose-optimization program which includes automated exposure control, adjustment of the mA and/or kV according to patient size and/or use of iterative reconstruction technique. CONTRAST:  75mL OMNIPAQUE IOHEXOL 350 MG/ML SOLN COMPARISON:  Radiograph earlier today.  Chest CTA 1 week ago FINDINGS: Cardiovascular: The known right lower lobe pulmonary emboli have diminished, with mild residual thrombus in the segmental/subsegmental branches series 5, image 73, extending distally. Overall improved thromboembolic burden from prior exam. No new or progressive pulmonary emboli. Aortic atherosclerosis. The heart is enlarged, particularly the left atrium and ventricle. Pacemaker again seen. Trace pericardial effusion. Mediastinum/Nodes: Unchanged shotty mediastinal and bilateral hilar lymph nodes. Patulous esophagus. No thyroid nodule. Lungs/Pleura: Progressive ground-glass and consolidative opacity in the right lower lobe. Diffuse heterogeneous pulmonary parenchyma with areas of smooth septal thickening. Moderate bronchial thickening. Trace right pleural effusion, increased. Upper Abdomen: No acute findings. Musculoskeletal: No acute findings.  Stable thoracic spondylosis. Review of the MIP images confirms the above findings. IMPRESSION: 1. The known right lower lobe pulmonary emboli have diminished over the last week, with mild residual thrombus in the segmental/subsegmental branches. Overall improved thromboembolic burden from prior exam. No new or progressive pulmonary emboli. 2. Progressive ground-glass and consolidative opacity in the right lower lobe, favor worsening pneumonia. 3. Diffuse heterogeneous  pulmonary parenchyma with areas of smooth septal thickening, query pulmonary edema. Trace right pleural effusion, increased. 4. Cardiomegaly. Aortic Atherosclerosis (ICD10-I70.0). Electronically Signed   By: Narda Rutherford M.D.   On: 10/07/2023 21:32   DG Chest 2 View Result Date: 10/07/2023 CLINICAL DATA:  Shortness of breath. EXAM: CHEST - 2 VIEW COMPARISON:  Chest radiograph dated 09/30/2023. FINDINGS: Stable cardiomegaly. Stable left chest wall cardiac device in place. Aortic atherosclerosis. Increased bibasilar interstitial opacities, more pronounced on the right. Mild bibasilar atelectasis. No sizable pleural effusion or pneumothorax. No acute osseous abnormality. IMPRESSION: 1. Increased bibasilar interstitial opacities, more pronounced on the right, could reflect asymmetric pulmonary edema or infectious/inflammatory process. 2. Stable cardiomegaly. Electronically Signed   By: Hart Robinsons M.D.   On: 10/07/2023 16:12           LOS: 1 day   Time spent= 35 mins    Miguel Rota, MD [facility]  If 7PM-7AM, please contact night-coverage  10/08/2023, 1:23 PM

## 2023-10-09 DIAGNOSIS — I5043 Acute on chronic combined systolic (congestive) and diastolic (congestive) heart failure: Secondary | ICD-10-CM | POA: Diagnosis not present

## 2023-10-09 DIAGNOSIS — J189 Pneumonia, unspecified organism: Secondary | ICD-10-CM | POA: Diagnosis not present

## 2023-10-09 LAB — BASIC METABOLIC PANEL WITH GFR
Anion gap: 12 (ref 5–15)
BUN: 18 mg/dL (ref 8–23)
CO2: 24 mmol/L (ref 22–32)
Calcium: 8.6 mg/dL — ABNORMAL LOW (ref 8.9–10.3)
Chloride: 102 mmol/L (ref 98–111)
Creatinine, Ser: 1.03 mg/dL — ABNORMAL HIGH (ref 0.44–1.00)
GFR, Estimated: >60 mL/min (ref >60)
Glucose, Bld: 106 mg/dL — ABNORMAL HIGH (ref 70–99)
Potassium: 3.6 mmol/L (ref 3.5–5.1)
Sodium: 138 mmol/L (ref 135–145)

## 2023-10-09 LAB — MAGNESIUM: Magnesium: 2 mg/dL (ref 1.7–2.4)

## 2023-10-09 LAB — GLUCOSE, CAPILLARY
Glucose-Capillary: 132 mg/dL — ABNORMAL HIGH (ref 70–99)
Glucose-Capillary: 176 mg/dL — ABNORMAL HIGH (ref 70–99)
Glucose-Capillary: 125 mg/dL — ABNORMAL HIGH (ref 70–99)

## 2023-10-09 LAB — PHOSPHORUS: Phosphorus: 3.9 mg/dL (ref 2.5–4.6)

## 2023-10-09 MED ORDER — POTASSIUM CHLORIDE CRYS ER 20 MEQ PO TBCR
40.0000 meq | EXTENDED_RELEASE_TABLET | Freq: Once | ORAL | Status: AC
  Administered 2023-10-09: 40 meq via ORAL
  Filled 2023-10-09: qty 2

## 2023-10-09 MED ORDER — DOXYCYCLINE HYCLATE 100 MG PO TABS
100.0000 mg | ORAL_TABLET | Freq: Two times a day (BID) | ORAL | Status: AC
  Administered 2023-10-09 – 2023-10-13 (×10): 100 mg via ORAL
  Filled 2023-10-09 (×10): qty 1

## 2023-10-09 MED ORDER — MAGNESIUM SULFATE 2 GM/50ML IV SOLN: 2.0000 g | Freq: Once | INTRAVENOUS | Status: DC

## 2023-10-09 MED ORDER — FUROSEMIDE 10 MG/ML IJ SOLN
60.0000 mg | Freq: Two times a day (BID) | INTRAMUSCULAR | Status: AC
  Administered 2023-10-09: 60 mg via INTRAVENOUS
  Filled 2023-10-09: qty 6

## 2023-10-09 NOTE — Evaluation (Signed)
 Physical Therapy Evaluation Patient Details Name: Lynn Estrada MRN: [id number] DOB: Oct 29, 1960 Today's Date: 10/09/2023  History of Present Illness  Lynn Estrada is a 63 y.o. female admitted 10/07/23 for suspected HCAP. Pt presented with SOB. CTA chest showed right lower lobe pneumonia, pulmonary edema and effusion. PMH of acute PE, systolic CHF EF 25%., DM2, HLD, HTN, GERD, nephrolithiasis, partial right nephrectomy, LBBB, and anemia of chronic disease. Of note recent hospitalization 3/31-4/2 for RLL PE.   Clinical Impression  Pt admitted with above diagnosis. PTA, pt was modI for functional mobility using a RW and modI for ADLs. She resides with her husband and daughter in a one story house with 1 STE. Pt reports strong family support available to provide 24/7 supervision and assist. Pt currently with functional limitations due to the deficits listed below (see PT Problem List). She required supervision for OOB using rollator. Pt benefited from the introduction of a rollator for energy conservation and reported increased confidence. Pt will benefit from acute skilled PT to increase her independence and safety with mobility to allow discharge home. Recommend OPPT for increase strength, improve cardiopulmonary endurance, and decrease fall risk.     If plan is discharge home, recommend the following: A [place] help with walking and/or transfers;A [place] help with bathing/dressing/bathroom;Assistance with cooking/housework;Assist for transportation;Help with stairs or ramp for entrance   Can travel by private vehicle        Equipment Recommendations Rollator (4 wheels)  Recommendations for Other Services       Functional Status Assessment Patient has had a recent decline in their functional status and/or demonstrates limited ability to make significant improvements in function in a reasonable and predictable amount of time     Precautions / Restrictions Precautions Precautions:  Fall Recall of Precautions/Restrictions: Intact Restrictions Weight Bearing Restrictions Per Provider Order: No      Mobility  Bed Mobility Overal bed mobility: Modified Independent                  Transfers Overall transfer level: Needs assistance Equipment used: Rollator (4 wheels) Transfers: Sit to/from Stand Sit to Stand: Supervision, From elevated surface           General transfer comment: VC for hand positioning and sequencing using rollator. Pt stood from raised bed height given her height (6\' 2" ). Pt powered up without physical assistance. Good eccentric control with sitting.    Ambulation/Gait Ambulation/Gait assistance: Supervision Gait Distance (Feet): 125 Feet Assistive device: Rollator (4 wheels) Gait Pattern/deviations: Step-through pattern, Decreased stride length Gait velocity: Pt reports slight increase compared to home using RW Gait velocity interpretation: <1.8 ft/sec, indicate of risk for recurrent falls   General Gait Details: Pt ambulated with a reciprocal gait pattern maintaining upright posture and body inside RW at all times. She navigated the room and hallway. Pt reported DOE 2/4, but did not require any seated rest breaks.  Stairs            Wheelchair Mobility     Tilt Bed    Modified Rankin (Stroke Patients Only)       Balance Overall balance assessment: Needs assistance Sitting-balance support: No upper extremity supported Sitting balance-Leahy Scale: Good     Standing balance support: Bilateral upper extremity supported, During functional activity, Reliant on assistive device for balance Standing balance-Leahy Scale: Fair Standing balance comment: Pt utilizes rollator and supervision for gait.  Pertinent Vitals/Pain Pain Assessment Pain Assessment: No/denies pain    Home Living Family/patient expects to be discharged to:: Private residence Living Arrangements:  Spouse/significant other;Children Available Help at Discharge: Family;Available 24 hours/day Type of Home: House Home Access: Stairs to enter Entrance Stairs-Rails: None Entrance Stairs-Number of Steps: 1   Home Layout: One level Home Equipment: Agricultural consultant (2 wheels);Cane - single point;Crutches      Prior Function Prior Level of Function : Independent/Modified Independent             Mobility Comments: Ambulates using RW or SPC. Denies falls in the last 59mo. ADLs Comments: modI with ADLs, takes increased time. Pt relies on family for household management and transportation.     Extremity/Trunk Assessment   Upper Extremity Assessment Upper Extremity Assessment: Defer to OT evaluation    Lower Extremity Assessment Lower Extremity Assessment: Generalized weakness    Cervical / Trunk Assessment Cervical / Trunk Assessment: Normal  Communication   Communication Communication: No apparent difficulties    Cognition Arousal: Alert Behavior During Therapy: WFL for tasks assessed/performed   PT - Cognitive impairments: No apparent impairments                       PT - Cognition Comments: Pt A,Ox4. Following commands: Intact       Cueing Cueing Techniques: Verbal cues     General Comments General comments (skin integrity, edema, etc.): VSS on RA with SpO2 >95% throughout session. Encouraged pt to ambulate with nursing/mobility 2x/day and transfer to recliner chair 3x/day.    Exercises     Assessment/Plan    PT Assessment Patient needs continued PT services  PT Problem List Decreased strength;Decreased activity tolerance;Decreased balance;Decreased mobility;Decreased knowledge of use of DME;Cardiopulmonary status limiting activity       PT Treatment Interventions DME instruction;Gait training;Stair training;Functional mobility training;Therapeutic activities;Therapeutic exercise;Balance training;Patient/family education    PT Goals (Current goals  can be found in the Care Plan section)  Acute Rehab PT Goals Patient Stated Goal: Get stronger and move easier PT Goal Formulation: With patient Time For Goal Achievement: 10/23/23 Potential to Achieve Goals: Good    Frequency Min 2X/week     Co-evaluation               AM-PAC PT "6 Clicks" Mobility  Outcome Measure Help needed turning from your back to your side while in a flat bed without using bedrails?: None Help needed moving from lying on your back to sitting on the side of a flat bed without using bedrails?: None Help needed moving to and from a bed to a chair (including a wheelchair)?: A [place] Help needed standing up from a chair using your arms (e.g., wheelchair or bedside chair)?: A [place] Help needed to walk in hospital room?: A [place] Help needed climbing 3-5 steps with a railing? : A [place] 6 Click Score: 19    End of Session Equipment Utilized During Treatment: Gait belt Activity Tolerance: Patient tolerated treatment well Patient left: in bed;with call bell/phone within reach Nurse Communication: Mobility status PT Visit Diagnosis: Difficulty in walking, not elsewhere classified (R26.2);Muscle weakness (generalized) (M62.81)    Time: 0454-0981 PT Time Calculation (min) (ACUTE ONLY): 24 min   Charges:   PT Evaluation $PT Eval Low Complexity: 1 Low   PT General Charges $$ ACUTE PT VISIT: 1 Visit         Cheri Guppy, PT, DPT Acute Rehabilitation Services Office: [phone] Secure Chat Preferred  Lynn Estrada  Lynn Estrada 10/09/2023, 5:46 PM

## 2023-10-09 NOTE — Progress Notes (Signed)
 PROGRESS NOTE    Lynn Estrada  WUJ:[id number] DOB: 12/31/1960 DOA: 10/07/2023 PCP: Porfirio Oar, PA    Brief Narrative:   63 year old with history of acute PE, systolic CHF EF 25%., DM2, HLD, anemia of chronic disease, admitted to [facility] recently for pneumonia now being admitted again for shortness of breath with concerns of fluid overload recurrent HCAP.  During recent admission patient completed 5 days of cefepime and azithromycin.  CTA chest showed right lower lobe pneumonia, pulmonary edema and effusion.  Eventually thought to be in fluid overload therefore started on diuresis, cardiology team consulted.  Assessment & Plan:  Principal Problem:   HCAP (healthcare-associated pneumonia) Active Problems:   HLD (hyperlipidemia)   Acute on chronic combined systolic and diastolic CHF (congestive heart failure) (HCC)   SOB (shortness of breath)   History of pulmonary embolism   DM2 (diabetes mellitus, type 2) (HCC)   Anemia of chronic disease   Acute congestive heart failure with reduced EF, 25% Status post BiV PPM History of nonobstructive CAD - Elevated BNP.  Receiving Lasix IV.  Responding well to this.  Echocardiogram done in February 2025.  I will consult cardiology due to recent recurrent admissions - Continue bisoprolol, Aldactone, digoxin -Ambulatory pulse ox ordered    Acute respiratory distress Healthcare acquired pneumonia - CTA chest-reviewed.  Elevated BNP.  Procalcitonin is negative.  Very low suspicion for pneumonia.  Bronchodilators, I-S/flutter valve.  Will complete 5-day course of doxycycline.  Respiratory panel is negative  History of pulmonary embolism - On Eliquis  Diabetes mellitus type 2 - Accu-Cheks and sliding scale  Hyperlipidemia - Statin  Anemia of chronic disease - Baseline hemoglobin  History of CVA - November 2024.  Now on Eliquis, Plavix have been discontinued since previous admission   DVT prophylaxis: apixaban  (ELIQUIS) tablet 5 mg     Code Status: Full Code Family Communication:   Status is: Inpatient Continue hospital stay for ongoing diuresis Hopefully home by tomorrow   Subjective: Doing ok SOB is better.    Examination:  General exam: Appears calm and comfortable  Respiratory system: Diminished breath sounds at the bases Cardiovascular system: S1 & S2 heard, RRR. No JVD, murmurs, rubs, gallops or clicks. No pedal edema. Gastrointestinal system: Abdomen is nondistended, soft and nontender. No organomegaly or masses felt. Normal bowel sounds heard. Central nervous system: Alert and oriented. No focal neurological deficits. Extremities: Symmetric 5 x 5 power. Skin: No rashes, lesions or ulcers Psychiatry: Judgement and insight appear normal. Mood & affect appropriate.                Diet Orders (From admission, onward)     Start     Ordered   10/08/23 0048  Diet regular Room service appropriate? Yes; Fluid consistency: Thin  Diet effective now       Question Answer Comment  Room service appropriate? Yes   Fluid consistency: Thin      10/08/23 0048            Objective: Vitals:   10/09/23 0103 10/09/23 0434 10/09/23 0455 10/09/23 0820  BP: 95/75  98/77 99/70  Pulse: 74  75 82  Resp: 20  20 18   Temp: 98.2 F (36.8 C)  98.2 F (36.8 C)   TempSrc: Oral  Oral   SpO2: 92%  92% 96%  Weight:  83.7 kg    Height:        Intake/Output Summary (Last 24 hours) at 10/09/2023 1106 Last data filed  at 10/09/2023 0835 Gross per 24 hour  Intake 597 ml  Output 3300 ml  Net -2703 ml   Filed Weights   10/07/23 1800 10/08/23 1436 10/09/23 0434  Weight: 86.2 kg 83.3 kg 83.7 kg    Scheduled Meds:  apixaban  5 mg Oral BID   atorvastatin  10 mg Oral q AM   bisoprolol  2.5 mg Oral Daily   digoxin  125 mcg Oral Daily   doxycycline  100 mg Oral Q12H   furosemide  40 mg Intravenous BID   insulin aspart  0-9 Units Subcutaneous TID WC   midodrine  5 mg Oral TID WC    spironolactone  25 mg Oral Daily   Continuous Infusions:  Nutritional status     Body mass index is 23.69 kg/m.  Data Reviewed:   CBC: Recent Labs  Lab 10/02/23 1123 10/07/23 1441 10/08/23 0530  WBC 3.5* 5.2 4.8  NEUTROABS  --  3.7 3.3  HGB 11.5* 9.8* 9.3*  HCT 37.9 32.0* 30.4*  MCV 91.5 90.4 89.9  PLT 260 230 206   Basic Metabolic Panel: Recent Labs  Lab 10/02/23 1123 10/07/23 1441 10/08/23 0530 10/09/23 0224  NA 140 140 140 138  K 3.7 4.0 3.6 3.6  CL 102 106 103 102  CO2 28 23 26 24   GLUCOSE 162* 118* 100* 106*  BUN 22 16 15 18   CREATININE 1.09* 0.96 1.04* 1.03*  CALCIUM 9.5 9.2 8.9 8.6*  MG  --   --  2.0 2.0  PHOS  --   --  4.3 3.9   GFR: Estimated Creatinine Clearance: 70.4 mL/min (A) (by C-G formula based on SCr of 1.03 mg/dL (H)). Liver Function Tests: Recent Labs  Lab 10/08/23 0530  AST 22  ALT 30  ALKPHOS 44  BILITOT 1.3*  PROT 6.5  ALBUMIN 3.4*   No results for input(s): "LIPASE", "AMYLASE" in the last 168 hours. No results for input(s): "AMMONIA" in the last 168 hours. Coagulation Profile: No results for input(s): "INR", "PROTIME" in the last 168 hours. Cardiac Enzymes: No results for input(s): "CKTOTAL", "CKMB", "CKMBINDEX", "TROPONINI" in the last 168 hours. BNP (last 3 results) No results for input(s): "PROBNP" in the last 8760 hours. HbA1C: No results for input(s): "HGBA1C" in the last 72 hours. CBG: Recent Labs  Lab 10/08/23 0851 10/08/23 1244 10/08/23 1551 10/08/23 2132 10/09/23 0646  GLUCAP 91 93 109* 121* 132*   Lipid Profile: No results for input(s): "CHOL", "HDL", "LDLCALC", "TRIG", "CHOLHDL", "LDLDIRECT" in the last 72 hours. Thyroid Function Tests: No results for input(s): "TSH", "T4TOTAL", "FREET4", "T3FREE", "THYROIDAB" in the last 72 hours. Anemia Panel: No results for input(s): "VITAMINB12", "FOLATE", "FERRITIN", "TIBC", "IRON", "RETICCTPCT" in the last 72 hours. Sepsis Labs: Recent Labs  Lab 10/07/23 1457  10/08/23 0530  PROCALCITON  --  <0.10  LATICACIDVEN 0.7  --     Recent Results (from the past 240 hours)  Resp panel by RT-PCR (RSV, Flu A&B, Covid) Anterior Nasal Swab     Status: None   Collection Time: 09/30/23 12:29 PM   Specimen: Anterior Nasal Swab  Result Value Ref Range Status   SARS Coronavirus 2 by RT PCR NEGATIVE NEGATIVE Final    Comment: (NOTE) SARS-CoV-2 target nucleic acids are NOT DETECTED.  The SARS-CoV-2 RNA is generally detectable in upper respiratory specimens during the acute phase of infection. The lowest concentration of SARS-CoV-2 viral copies this assay can detect is 138 copies/mL. A negative result does not preclude SARS-Cov-2 infection and  should not be used as the sole basis for treatment or other patient management decisions. A negative result may occur with  improper specimen collection/handling, submission of specimen other than nasopharyngeal swab, presence of viral mutation(s) within the areas targeted by this assay, and inadequate number of viral copies(<138 copies/mL). A negative result must be combined with clinical observations, patient history, and epidemiological information. The expected result is Negative.  Fact Sheet for Patients:  BloggerCourse.com  Fact Sheet for Healthcare Providers:  SeriousBroker.it  This test is no t yet approved or cleared by the [place] FDA and  has been authorized for detection and/or diagnosis of SARS-CoV-2 by FDA under an Emergency Use Authorization (EUA). This EUA will remain  in effect (meaning this test can be used) for the duration of the COVID-19 declaration under Section 564(b)(1) of the Act, 21 U.S.C.section 360bbb-3(b)(1), unless the authorization is terminated  or revoked sooner.       Influenza A by PCR NEGATIVE NEGATIVE Final   Influenza B by PCR NEGATIVE NEGATIVE Final    Comment: (NOTE) The Xpert Xpress SARS-CoV-2/FLU/RSV plus assay  is intended as an aid in the diagnosis of influenza from Nasopharyngeal swab specimens and should not be used as a sole basis for treatment. Nasal washings and aspirates are unacceptable for Xpert Xpress SARS-CoV-2/FLU/RSV testing.  Fact Sheet for Patients: BloggerCourse.com  Fact Sheet for Healthcare Providers: SeriousBroker.it  This test is not yet approved or cleared by the [place] FDA and has been authorized for detection and/or diagnosis of SARS-CoV-2 by FDA under an Emergency Use Authorization (EUA). This EUA will remain in effect (meaning this test can be used) for the duration of the COVID-19 declaration under Section 564(b)(1) of the Act, 21 U.S.C. section 360bbb-3(b)(1), unless the authorization is terminated or revoked.     Resp Syncytial Virus by PCR NEGATIVE NEGATIVE Final    Comment: (NOTE) Fact Sheet for Patients: BloggerCourse.com  Fact Sheet for Healthcare Providers: SeriousBroker.it  This test is not yet approved or cleared by the [place] FDA and has been authorized for detection and/or diagnosis of SARS-CoV-2 by FDA under an Emergency Use Authorization (EUA). This EUA will remain in effect (meaning this test can be used) for the duration of the COVID-19 declaration under Section 564(b)(1) of the Act, 21 U.S.C. section 360bbb-3(b)(1), unless the authorization is terminated or revoked.  Performed at [facility], [address] [place], [place] [address]   Respiratory (~20 pathogens) panel by PCR     Status: None   Collection Time: 10/08/23  3:46 PM   Specimen: Nasopharyngeal Swab; Respiratory  Result Value Ref Range Status   Adenovirus NOT DETECTED NOT DETECTED Final   Coronavirus 229E NOT DETECTED NOT DETECTED Final    Comment: (NOTE) The Coronavirus on the Respiratory Panel, DOES NOT test for the novel  Coronavirus (2019  nCoV)    Coronavirus HKU1 NOT DETECTED NOT DETECTED Final   Coronavirus NL63 NOT DETECTED NOT DETECTED Final   Coronavirus OC43 NOT DETECTED NOT DETECTED Final   Metapneumovirus NOT DETECTED NOT DETECTED Final   Rhinovirus / Enterovirus NOT DETECTED NOT DETECTED Final   Influenza A NOT DETECTED NOT DETECTED Final   Influenza B NOT DETECTED NOT DETECTED Final   Parainfluenza Virus 1 NOT DETECTED NOT DETECTED Final   Parainfluenza Virus 2 NOT DETECTED NOT DETECTED Final   Parainfluenza Virus 3 NOT DETECTED NOT DETECTED Final   Parainfluenza Virus 4 NOT DETECTED NOT DETECTED Final   Respiratory Syncytial Virus  NOT DETECTED NOT DETECTED Final   Bordetella pertussis NOT DETECTED NOT DETECTED Final   Bordetella Parapertussis NOT DETECTED NOT DETECTED Final   Chlamydophila pneumoniae NOT DETECTED NOT DETECTED Final   Mycoplasma pneumoniae NOT DETECTED NOT DETECTED Final    Comment: Performed at [facility], [address], [place], [place] [address]         Radiology Studies: CT ABDOMEN PELVIS W CONTRAST Result Date: 10/07/2023 CLINICAL DATA:  Abdominal pain. EXAM: CT ABDOMEN AND PELVIS WITH CONTRAST TECHNIQUE: Multidetector CT imaging of the abdomen and pelvis was performed using the standard protocol following bolus administration of intravenous contrast. RADIATION DOSE REDUCTION: This exam was performed according to the departmental dose-optimization program which includes automated exposure control, adjustment of the mA and/or kV according to patient size and/or use of iterative reconstruction technique. CONTRAST:  75mL OMNIPAQUE IOHEXOL 350 MG/ML SOLN COMPARISON:  August 04, 2023 FINDINGS: Lower chest: Marked severity right lower lobe infiltrate is seen. Moderate severity lingular and left lower lobe linear atelectasis is also noted. There is a small right pleural effusion. Mild to moderate severity areas of intraluminal low attenuation are seen involving multiple lower lobe branches  of the right pulmonary artery (axial CT images 1 through 7, CT series 14). Hepatobiliary: No focal liver abnormality is seen. No gallstones, gallbladder wall thickening, or biliary dilatation. Pancreas: Unremarkable. No pancreatic ductal dilatation or surrounding inflammatory changes. Spleen: Normal in size without focal abnormality. Adrenals/Urinary Tract: Adrenal glands are unremarkable. Kidneys are normal in size, without obstructing renal calculi, focal lesion, or hydronephrosis. Numerous bilateral subcentimeter non-obstructing renal calculi are seen. Bladder is unremarkable. Stomach/Bowel: Stomach is within normal limits. Appendix appears normal. No evidence of bowel wall thickening, distention, or inflammatory changes. Vascular/Lymphatic: Aortic atherosclerosis. No enlarged abdominal or pelvic lymph nodes. Reproductive: Status post hysterectomy. No adnexal masses. Other: No abdominal wall hernia or abnormality. No abdominopelvic ascites. Musculoskeletal: Moderate to marked severity degenerative changes are seen at the level of L5-S1. IMPRESSION: 1. Marked severity right lower lobe infiltrate with a small right pleural effusion. 2. Mild to moderate severity known right lower lobe pulmonary embolism. 3. Numerous bilateral subcentimeter non-obstructing renal calculi. 4. Moderate to marked severity degenerative changes at the level of L5-S1. 5. Aortic atherosclerosis. Aortic Atherosclerosis (ICD10-I70.0). Electronically Signed   By: Aram Candela M.D.   On: 10/07/2023 22:54   CT Angio Chest PE W and/or Wo Contrast Result Date: 10/07/2023 CLINICAL DATA:  Pulmonary embolism (PE) suspected, high prob Shortness of breath. EXAM: CT ANGIOGRAPHY CHEST WITH CONTRAST TECHNIQUE: Multidetector CT imaging of the chest was performed using the standard protocol during bolus administration of intravenous contrast. Multiplanar CT image reconstructions and MIPs were obtained to evaluate the vascular anatomy. RADIATION DOSE  REDUCTION: This exam was performed according to the departmental dose-optimization program which includes automated exposure control, adjustment of the mA and/or kV according to patient size and/or use of iterative reconstruction technique. CONTRAST:  75mL OMNIPAQUE IOHEXOL 350 MG/ML SOLN COMPARISON:  Radiograph earlier today.  Chest CTA 1 week ago FINDINGS: Cardiovascular: The known right lower lobe pulmonary emboli have diminished, with mild residual thrombus in the segmental/subsegmental branches series 5, image 73, extending distally. Overall improved thromboembolic burden from prior exam. No new or progressive pulmonary emboli. Aortic atherosclerosis. The heart is enlarged, particularly the left atrium and ventricle. Pacemaker again seen. Trace pericardial effusion. Mediastinum/Nodes: Unchanged shotty mediastinal and bilateral hilar lymph nodes. Patulous esophagus. No thyroid nodule. Lungs/Pleura: Progressive ground-glass and consolidative opacity in the right lower  lobe. Diffuse heterogeneous pulmonary parenchyma with areas of smooth septal thickening. Moderate bronchial thickening. Trace right pleural effusion, increased. Upper Abdomen: No acute findings. Musculoskeletal: No acute findings.  Stable thoracic spondylosis. Review of the MIP images confirms the above findings. IMPRESSION: 1. The known right lower lobe pulmonary emboli have diminished over the last week, with mild residual thrombus in the segmental/subsegmental branches. Overall improved thromboembolic burden from prior exam. No new or progressive pulmonary emboli. 2. Progressive ground-glass and consolidative opacity in the right lower lobe, favor worsening pneumonia. 3. Diffuse heterogeneous pulmonary parenchyma with areas of smooth septal thickening, query pulmonary edema. Trace right pleural effusion, increased. 4. Cardiomegaly. Aortic Atherosclerosis (ICD10-I70.0). Electronically Signed   By: Narda Rutherford M.D.   On: 10/07/2023 21:32    DG Chest 2 View Result Date: 10/07/2023 CLINICAL DATA:  Shortness of breath. EXAM: CHEST - 2 VIEW COMPARISON:  Chest radiograph dated 09/30/2023. FINDINGS: Stable cardiomegaly. Stable left chest wall cardiac device in place. Aortic atherosclerosis. Increased bibasilar interstitial opacities, more pronounced on the right. Mild bibasilar atelectasis. No sizable pleural effusion or pneumothorax. No acute osseous abnormality. IMPRESSION: 1. Increased bibasilar interstitial opacities, more pronounced on the right, could reflect asymmetric pulmonary edema or infectious/inflammatory process. 2. Stable cardiomegaly. Electronically Signed   By: Hart Robinsons M.D.   On: 10/07/2023 16:12           LOS: 2 days   Time spent= 35 mins    Miguel Rota, MD [facility]  If 7PM-7AM, please contact night-coverage  10/09/2023, 11:06 AM

## 2023-10-09 NOTE — Consult Note (Signed)
 Advanced Heart Failure Team Consult Note   Primary Physician: Porfirio Oar, PA Cardiologist:  Dr. Gala Romney   Reason for Consultation: acute on chronic systolic heart failure   HPI:    Lynn Estrada is seen today for evaluation of acute on chronic systolic heart failure at the request of Dr. Nelson Chimes, Internal Medicine.   Lynn Estrada is a 63 y.o.with a history of HTN, GERD, nephrolithiasis, partial right nephrectomy, LBBB, and combined diastolic/systolic HF.      Admitted 1/23 with acute HFrEF. 07/13/21 Echo 20-25%, RV normal, grade II DD , and septal - lateral dyssnchrony. R/L cath w/ Minimal CAD, mildly elevated filling pressures and normal CO. cMRI ? Myocarditis (see impression below). LVEF 18%. Placed on Jardiance and spiro. Not on bb with acute decompensation. No ARNi with hypotension. Discharged on 07/20/21.    Echo 10/25/21 EF 30-35%   RHC 11/23 RA = 6 RV = 30/10  PA = 35/21 (29) PCW = 20  Fick cardiac output/index = 4.8/2.3 Thermo CO/CI = 5.2/2.4 PVR = 1.9 WU FA sat = 96% PA sat = 62%   CPX 12/23 - submax test. Non diagnostic.  Time 2:45 FVC 2.14 (58% predicted)      FEV1 1.82 (64% predicted)        FEV1/FVC 85%        MVV 65 (59% predicted)  BP rest: 100/74 BP standing: 94/72 BP peak: 92/70  Peak VO2: 8.4 (40.9% predicted peak VO2)  VE/VCO2 slope:  33 Peak RER: 0.85    Underwent Abbott CRT-D on 01/14/23. Got good result with significant QRS narrowing.    Echo 11/24 EF 20-25%    Admitted in 11/24 with stroke like symptoms (aphasia) CTA head a showed a possible small distal ACA occlusion.  CT head was negative for stroke. 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   Admitted 2/25 with PNA and UTI. [name] and Jardiance held but [name] was restarted at post hospital f/u 09/03/23.   She was recently readmitted from 3/31-4/2 for acute hypoxic respiratory failure in the setting of new  RLL PE and CHF.  CTA of chest showed minimal clot burden. Echo not done. LE venous doppler showed superficial vein thrombosis of the left saphenous vein but no DVT. She was placed on Eliquis and diuresed w/ IV Lasix. Entresto was discontinued due to soft BP.   She presented back to the ED on 4/7 w/ worsening SOB, decreased exercise tolerance, orthopnea, worsening cough and swelling of the lower extremities. CXR showed increased bibasilar interstitial opacitites R>L compared to previous exam, concerning asymmetric pulmonary edema or infectious/inflammatory process. Repeat CT of chest showed interval improvement of PE burden, diminished from initial scan. No new or progressive pulmonary emboli. Study however was also c/w worsening PNA and trace rt pleural effusion. Respiratory panel negative. PCT < 0.10. BNP elevated 916 (up from recent baseline). She was readmitted and placed on doxycyline and IV Lasix. AHF team consulted to assist w/ further management of CHF.   Good response to IV Lasix thus far. 3L in UOP yesterday. SCr stable 1.03. K 3.6. Off Entresto, on midodrine. No current dyspnea sitting but symptomatic w/ exertion, NYHA Class III-IIIb.   Most Recent Echo 08/2023  1. Left ventricular ejection fraction, by estimation, is 20 to 25%. The  left ventricle has severely decreased function. The left ventricle  demonstrates global hypokinesis. The left ventricular internal cavity size  was severely dilated. Left  ventricular  diastolic parameters are consistent with Grade II diastolic dysfunction  (pseudonormalization). Elevated left atrial pressure.   2. Right ventricular systolic function is normal. The right ventricular  size is normal. Tricuspid regurgitation signal is inadequate for assessing  PA pressure.   3. Left atrial size was moderately dilated.   4. The mitral valve is normal in structure. Trivial mitral valve  regurgitation. No evidence of mitral stenosis.   5. The aortic valve is tricuspid. Aortic  valve regurgitation is not  visualized. No aortic stenosis is present.   6. The inferior vena cava is normal in size with greater than 50%  respiratory variability, suggesting right atrial pressure of 3 mmHg.   Comparison(s): Prior images reviewed side by side. The left ventricular  function is unchanged. The left ventricular diastolic function is  significantly worse.   Home Medications Prior to Admission medications   Medication Sig Start Date End Date Taking? Authorizing Provider  acetaminophen (TYLENOL) 500 MG tablet Take 500-1,000 mg by mouth every 6 (six) hours as needed (for pain.).   Yes [provider]  APIXABAN Everlene Balls) VTE STARTER PACK (10MG  AND 5MG ) Take as directed on package: start with two-5mg  tablets twice daily till 10/07/2023 AM then from 10/07/2023 evening take one-5mg  tab twice daily. Patient taking differently: Take 5-10 mg by mouth See admin instructions. Take as directed on package: start with two-5mg  tablets twice daily till 10/07/2023 AM then from 10/07/2023 evening take one-5mg  tab twice daily. 10/02/23  Yes Lanae Boast, MD  atorvastatin (LIPITOR) 10 MG tablet Take 10 mg by mouth in the morning. 05/15/21  Yes [provider]  benzonatate (TESSALON) 200 MG capsule Take 1 capsule (200 mg total) by mouth 3 (three) times daily as needed for cough (not relieved with tussionex). 09/17/23  Yes [place], Eric J, DO  bisoprolol (ZEBETA) 5 MG tablet Take 0.5 tablets (2.5 mg total) by mouth daily. 07/19/23  Yes Bensimhon, Bevelyn Buckles, MD  Calcium Carbonate Antacid (TUMS E-X PO) Take 1 tablet by mouth daily.   Yes [provider]  chlorpheniramine-HYDROcodone (TUSSIONEX) 10-8 MG/5ML Take 5 mLs by mouth every 12 (twelve) hours as needed for cough. 09/17/23  Yes [place], Eric J, DO  digoxin (LANOXIN) 0.125 MG tablet Take 1 tablet (125 mcg total) by mouth daily. 09/03/23  Yes Bensimhon, Bevelyn Buckles, MD  furosemide (LASIX) 20 MG tablet Take 1 tablet (20 mg total) by mouth  daily as needed. Patient taking differently: Take 20 mg by mouth daily as needed for fluid or edema. 10/02/23 11/01/23 Yes Lanae Boast, MD  JARDIANCE 10 MG TABS tablet Take 10 mg by mouth daily. 09/07/23  Yes [provider]  metFORMIN (GLUCOPHAGE) 500 MG tablet Take 1 tablet by mouth 2 (two) times daily with a meal. 10/07/23  Yes [provider]  midodrine (PROAMATINE) 5 MG tablet Take 1 tablet (5 mg total) by mouth 3 (three) times daily with meals. 09/17/23 12/16/23 Yes [place], Alvira Philips, DO  Multiple Vitamins-Minerals (ONE-A-DAY WOMENS PO) Take 1 tablet by mouth daily with breakfast.   Yes [provider]  ondansetron (ZOFRAN-ODT) 4 MG disintegrating tablet Take 4 mg by mouth every 8 (eight) hours as needed for nausea or vomiting (dissolve orally).   Yes [provider]  spironolactone (ALDACTONE) 25 MG tablet Take 1 tablet (25 mg total) by mouth daily. 09/18/23 12/17/23 Yes [place], Eric J, DO    Past Medical History: Past Medical History:  Diagnosis Date   Anemia    Arthritis  CHF (congestive heart failure) (HCC) 12/06/2022   EF is 20% to 25 %   COPD (chronic obstructive pulmonary disease) (HCC)    Diabetes mellitus without complication (HCC)    Eczema    GERD (gastroesophageal reflux disease)    History of adenomatous polyp of colon    08-03-2016  tubular adenoma x2 and hyperplastic   History of chronic gastritis 08/03/2016   History of ectopic pregnancy 1988   s/p  left salpingectomy   History of endometriosis    History of kidney stones    History of partial nephrectomy    right pelvis for very large stone   History of pneumonia 07/02/2016   CAP   History of sepsis    07-01-2016  sepsis w/ pyelonephritis, CAP /   12-31-2016  urosepsis w/ kidney stone obstruction   History of uterine leiomyoma    Hx of adenomatous colonic polyps 08/03/2016   2 adenomas, 1 hpp and 1 lost polyp 2018 all < 1 cm    Hyperlipidemia    Hypertension    Left ureteral stone     Myocardial infarction [facility])    Nephrolithiasis    left obstructive stone and right non-obstructive stone  per CT 12-31-2016   Urgency of urination     Past Surgical History: Past Surgical History:  Procedure Laterality Date   ABDOMINAL HYSTERECTOMY  01/20/2007   w/ Lysis Adhesions/  Left salpingoophorectomy/  Right Salpingectomy   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   Procedure: BIV ICD INSERTION CRT-D;  Surgeon: Maurice Small, MD;  Location: [facility];  Service: Cardiovascular;  Laterality: N/A;   CHOLECYSTECTOMY  1994   and Right Partial Nephrectomy (pelvis for very large stone)   COLONOSCOPY WITH PROPOFOL N/A 03/26/2023   Procedure: COLONOSCOPY WITH PROPOFOL;  Surgeon: Iva Boop, MD;  Location: [facility];  Service: Gastroenterology;  Laterality: N/A;   CYSTOSCOPY W/ URETERAL STENT PLACEMENT Left 12/31/2016   Procedure: CYSTOSCOPY WITH RETROGRADE PYELOGRAM/ LEFT URETERAL STENT PLACEMENT;  Surgeon: Sebastian Ache, MD;  Location: [facility];  Service: Urology;  Laterality: Left;   CYSTOSCOPY WITH RETROGRADE PYELOGRAM, URETEROSCOPY AND STENT PLACEMENT Left 01/18/2017   Procedure: 1ST STAGE CYSTOSCOPY WITH RETROGRADE PYELOGRAM, URETEROSCOPY AND STENT REPLACEMENT;  Surgeon: Sebastian Ache, MD;  Location: [facility];  Service: Urology;  Laterality: Left;   CYSTOSCOPY WITH RETROGRADE PYELOGRAM, URETEROSCOPY AND STENT PLACEMENT Left 02/01/2017   Procedure: 2ND STAGE CYSTOSCOPY WITH RETROGRADE PYELOGRAM, URETEROSCOPY AND STENT REPLACEMENT;  Surgeon: Sebastian Ache, MD;  Location: [facility];  Service: Urology;  Laterality: Left;   ECTOPIC PREGNANCY SURGERY  1988   Left Salpingectomy   HOLMIUM LASER APPLICATION Left 01/18/2017   Procedure: HOLMIUM LASER APPLICATION;  Surgeon: Sebastian Ache, MD;  Location: [facility];  Service: Urology;  Laterality: Left;   HOLMIUM LASER APPLICATION Left 02/01/2017   Procedure: HOLMIUM LASER  APPLICATION;  Surgeon: Sebastian Ache, MD;  Location: [facility];  Service: Urology;  Laterality: Left;   LUMBAR DISC SURGERY  01/1999   right L5 -- S1   POLYPECTOMY  03/26/2023   Procedure: POLYPECTOMY;  Surgeon: Iva Boop, MD;  Location: [facility];  Service: Gastroenterology;;   RE-EXPLORATION LUMBAR/  LAMINECTOMY AND MICRODISKECTOMY  10/14/2001   right L5 -- S1   RIGHT HEART CATH N/A 05/23/2022   Procedure: RIGHT HEART CATH;  Surgeon: Dolores Patty, MD;  Location: [facility];  Service: Cardiovascular;  Laterality: N/A;   RIGHT/LEFT  HEART CATH AND CORONARY ANGIOGRAPHY N/A 07/18/2021   Procedure: RIGHT/LEFT HEART CATH AND CORONARY ANGIOGRAPHY;  Surgeon: Dolores Patty, MD;  Location: [facility];  Service: Cardiovascular;  Laterality: N/A;   TUBAL LIGATION Bilateral 10/17/1999   PPTL   UMBILICAL HERNIA REPAIR  age 51    Family History: Family History  Problem Relation Age of Onset   Sarcoidosis Mother    Prostate cancer Father    Hypertension Sister    Eczema Sister    Lupus Sister    Breast cancer Maternal Aunt    Gout Maternal Grandmother    Diabetes Maternal Grandmother        leg amputations   Stomach cancer Neg Hx    Colon cancer Neg Hx    Esophageal cancer Neg Hx    Rectal cancer Neg Hx     Social History: Social History   Socioeconomic History   Marital status: Married    Spouse name: Richard Biber   Number of children: 1   Years of education: college   Highest education level: Not on file  Occupational History   Occupation: Product manager  Tobacco Use   Smoking status: Former    Current packs/day: 0.00    Average packs/day: 0.8 packs/day for 5.0 years (3.8 ttl pk-yrs)    Types: Cigarettes    Start date: 01/11/1992    Quit date: 01/10/1997    Years since quitting: 26.7   Smokeless tobacco: Never  Vaping Use   Vaping status: Never Used  Substance and Sexual Activity   Alcohol use: Not Currently     Comment: social use; once a month   Drug use: No   Sexual activity: Yes    Partners: Male    Birth control/protection: Surgical  Other Topics Concern   Not on file  Social History Narrative   Lives with her husband and their daughter and their dog.   Social Drivers of Corporate investment banker Strain: Low Risk  (09/24/2023)   Received from [facility]   Overall Financial Resource Strain (CARDIA)    Difficulty of Paying Living Expenses: Not very hard  Food Insecurity: No Food Insecurity (10/08/2023)   Hunger Vital Sign    Worried About Running Out of Food in the Last Year: Never true    Ran Out of Food in the Last Year: Never true  Transportation Needs: No Transportation Needs (10/08/2023)   PRAPARE - Administrator, Civil Service (Medical): No    Lack of Transportation (Non-Medical): No  Physical Activity: Unknown (09/24/2023)   Received [facility]   Exercise Vital Sign    Days of Exercise per Week: 0 days    Minutes of Exercise per Session: Not on file  Stress: No Stress Concern Present (09/24/2023)   Received from [facility] - Occupational Stress Questionnaire    Feeling of Stress : Not at all  Social Connections: Patient Declined (09/30/2023)   Social Connection and Isolation Panel [NHANES]    Frequency of Communication with Friends and Family: Patient declined    Frequency of Social Gatherings with Friends and Family: Patient declined    Attends Religious Services: Patient declined    Database administrator or Organizations: Patient declined    Attends Banker Meetings: Patient declined    Marital Status: Patient declined    Allergies:  Allergies  Allergen Reactions   Latex Rash and Hives   Bactrim [Sulfamethoxazole-Trimethoprim]  Rash   Carvedilol Itching   Metoprolol Itching and Rash    Objective:    Vital Signs:   Temp:  [97.7 F (36.5 C)-98.6 F (37 C)] 98.2 F (36.8 C) (04/09  0455) Pulse Rate:  [66-84] 82 (04/09 0820) Resp:  [18-27] 18 (04/09 0820) BP: (90-110)/(58-77) 99/70 (04/09 0820) SpO2:  [92 %-100 %] 96 % (04/09 0820) Weight:  [83.3 kg-83.7 kg] 83.7 kg (04/09 0434) Last BM Date : 10/07/23  Weight change: Filed Weights   10/07/23 1800 10/08/23 1436 10/09/23 0434  Weight: 86.2 kg 83.3 kg 83.7 kg    Intake/Output:   Intake/Output Summary (Last 24 hours) at 10/09/2023 0925 Last data filed at 10/09/2023 0835 Gross per 24 hour  Intake 597 ml  Output 3300 ml  Net -2703 ml      Physical Exam    General:  fatigue, frail appearing. No resp difficulty HEENT: normal Neck: supple. JVP 9 cm . Carotids 2+ bilat; no bruits. No lymphadenopathy or thyromegaly appreciated. Cor: PMI nondisplaced. Regular rate & rhythm. No rubs, gallops or murmurs. Lungs: decreased BS at the bases bilaterally  Abdomen: soft, nontender, +distended. or masses. Good bowel sounds. Extremities: no cyanosis, clubbing, rash, trace b/l ankle edema Neuro: alert & orientedx3, cranial nerves grossly intact. moves all 4 extremities w/o difficulty. Affect pleasant   Telemetry   A-B paced, 70s, personally reviewed   EKG    Admit EKG A-v paced 78 bpm   Labs   Basic Metabolic Panel: Recent Labs  Lab 10/02/23 1123 10/07/23 1441 10/08/23 0530 10/09/23 0224  NA 140 140 140 138  K 3.7 4.0 3.6 3.6  CL 102 106 103 102  CO2 28 23 26 24   GLUCOSE 162* 118* 100* 106*  BUN 22 16 15 18   CREATININE 1.09* 0.96 1.04* 1.03*  CALCIUM 9.5 9.2 8.9 8.6*  MG  --   --  2.0 2.0  PHOS  --   --  4.3 3.9    Liver Function Tests: Recent Labs  Lab 10/08/23 0530  AST 22  ALT 30  ALKPHOS 44  BILITOT 1.3*  PROT 6.5  ALBUMIN 3.4*   No results for input(s): "LIPASE", "AMYLASE" in the last 168 hours. No results for input(s): "AMMONIA" in the last 168 hours.  CBC: Recent Labs  Lab 10/02/23 1123 10/07/23 1441 10/08/23 0530  WBC 3.5* 5.2 4.8  NEUTROABS  --  3.7 3.3  HGB 11.5* 9.8* 9.3*   HCT 37.9 32.0* 30.4*  MCV 91.5 90.4 89.9  PLT 260 230 206    Cardiac Enzymes: No results for input(s): "CKTOTAL", "CKMB", "CKMBINDEX", "TROPONINI" in the last 168 hours.  BNP: BNP (last 3 results) Recent Labs    09/30/23 1229 10/02/23 1123 10/07/23 1442  BNP 690.8* 473.8* 916.5*    ProBNP (last 3 results) No results for input(s): "PROBNP" in the last 8760 hours.   CBG: Recent Labs  Lab 10/08/23 0851 10/08/23 1244 10/08/23 1551 10/08/23 2132 10/09/23 0646  GLUCAP 91 93 109* 121* 132*    Coagulation Studies: No results for input(s): "LABPROT", "INR" in the last 72 hours.   Imaging   No results found.   Medications:     Current Medications:  apixaban  5 mg Oral BID   atorvastatin  10 mg Oral q AM   bisoprolol  2.5 mg Oral Daily   digoxin  125 mcg Oral Daily   doxycycline  100 mg Oral Q12H   furosemide  40 mg Intravenous BID   insulin aspart  0-9 Units Subcutaneous TID WC   midodrine  5 mg Oral TID WC   potassium chloride  40 mEq Oral Once   spironolactone  25 mg Oral Daily    Infusions:     [name] w/ h/o nephrolithiasis s/p partial right nephrectomy, CVA, recently diagnosed PE and chronic systolic heart failure, LBBB s/p CRT-D non responder w/ most recent EF chronically low at 20-25%, multiple admissions for CHF in the last 3 months, readmitted w/ a/c CHF w/ NYHA Class IIIb symptoms.   Assessment/Plan   1. Acute on Chronic Combined Systolic/Diastolic Heart Failure, NICM - 07/13/21 Echo 20-25%, RV normal, grade II DD , and septal -lateral dyssnchrony - 07/2021 cMRI possible myocarditis  LVEF 18% Suggestive of LBBB, RVEF 33% - [facility] 07/2021 min CAD. RA 6 PCWP 21, CO 6.8/ CI 3.1 - Echo(4/23): EF 30-35% -> EF improving but still with significant LV dysfunction (suspect resolving myocarditis) - Echo 10/23 EF < 20% RV ok  -RHC 11/23 w/ Mildly elevated filling pressures with mildly decreased CO - CPX 12/23 - submax test. Non  diagnostic.  - Echo 12/06/22 EF 20-25%  RV normal.  - Echo 01/14/23 EF < 20% RV ok  - LBBB, QRS  [address]. -> now s/p Abbott CRT-D 7/24 -> QRS  - Echo 11/24 EF 20-25%  - Echo 2/25 EF 20-25%, RV nl  - Admitted w/ NYHA Class IIIb-IV symptoms w/ volume overload  - Multiple admits for a/c CHF in last several months + down-titration of GDMT due to hypotension and unintentional wt loss/ signs of cardiac cachectica. Concern she is nearing end-stage HF. Needs w/u for advanced therapies. Think, currently she is too deconditioned for transplant. Will need to work w/ PT. May be good candidate for LVAD. RV normal. SCr ok. But will need assessment of pulmonary function.  - for now, will continue diuresis w/ IV Lasix 40 mg bid  - plan RHC after diuresis, anticipate tomorrow vs Friday  - continue spironolactone 25 mg daily  - continue bisoprolol 2.5 mg daily  - continue digoxin 0.125 mg daily  - off Entresto due to hypotension - BP too soft for other GDMT. Continue midodrine 5 mg tid - will need eventual PFTs, after diuresis, to see if she would be a VAD candidate   2. ? PNA On empiric abx per IM, doxycyline, based on CXR/CT, though suspect findings most likely 2/2 CHF. Respiratory panel negative and PCT also normal, < 0.10  - differ continuation of abx to IM   3. PE - diagnosed previous admit 3/25, small burden RLL. LE venous dopplers + for SVT. No DVT   - on Eliquis. Repeat CT of chest this admit shows interval improvement of PE burden, diminished from initial scan. No new or progressive pulmonary emboli.  - continue Eliquis 5 mg bid   4. Fragility/Decondition - needs to ambulate more and will need to get stronger to be considered for transplant/VAD - will consult PT/OT     Length of Stay: 2  Robbie Lis, PA-C  10/09/2023, 9:25 AM  Advanced Heart Failure Team Pager [phone] (M-F; 7a - 5p)  Please contact [facility] for night-coverage after hours (4p -7a ) and weekends on  [email or web address]

## 2023-10-09 NOTE — Plan of Care (Signed)
  Problem: Activity: Goal: Risk for activity intolerance will decrease Outcome: Progressing   Problem: Elimination: Goal: Will not experience complications related to bowel motility Outcome: Progressing   

## 2023-10-09 NOTE — Progress Notes (Signed)
 Patient reports IV site left antecubital is painful.  IV stopped, slight swelling noted at site. IV removed.  Attempted to restart IV but patient yelled out very loudly and moved her arm.  Patient requests IV team to attempt restart.  Pharmacy notified of IV swelling at site of Vancomycin administration.  Warm pack placed at site of IV infiltration.

## 2023-10-10 ENCOUNTER — Encounter (HOSPITAL_COMMUNITY)

## 2023-10-10 DIAGNOSIS — I5043 Acute on chronic combined systolic (congestive) and diastolic (congestive) heart failure: Secondary | ICD-10-CM | POA: Diagnosis not present

## 2023-10-10 DIAGNOSIS — J189 Pneumonia, unspecified organism: Secondary | ICD-10-CM | POA: Diagnosis not present

## 2023-10-10 LAB — BASIC METABOLIC PANEL WITH GFR
Anion gap: 12 (ref 5–15)
BUN: 20 mg/dL (ref 8–23)
CO2: 23 mmol/L (ref 22–32)
Calcium: 8.9 mg/dL (ref 8.9–10.3)
Chloride: 103 mmol/L (ref 98–111)
Creatinine, Ser: 1.04 mg/dL — ABNORMAL HIGH (ref 0.44–1.00)
GFR, Estimated: >60 mL/min (ref >60)
Glucose, Bld: 98 mg/dL (ref 70–99)
Potassium: 3.8 mmol/L (ref 3.5–5.1)
Sodium: 138 mmol/L (ref 135–145)

## 2023-10-10 LAB — MAGNESIUM: Magnesium: 2.1 mg/dL (ref 1.7–2.4)

## 2023-10-10 LAB — GLUCOSE, CAPILLARY
Glucose-Capillary: 98 mg/dL (ref 70–99)
Glucose-Capillary: 156 mg/dL — ABNORMAL HIGH (ref 70–99)
Glucose-Capillary: 111 mg/dL — ABNORMAL HIGH (ref 70–99)
Glucose-Capillary: 137 mg/dL — ABNORMAL HIGH (ref 70–99)

## 2023-10-10 LAB — BRAIN NATRIURETIC PEPTIDE: B Natriuretic Peptide: 373.5 pg/mL — ABNORMAL HIGH (ref 0.0–100.0)

## 2023-10-10 MED ORDER — POTASSIUM CHLORIDE CRYS ER 20 MEQ PO TBCR
40.0000 meq | EXTENDED_RELEASE_TABLET | Freq: Two times a day (BID) | ORAL | Status: DC
  Administered 2023-10-10 – 2023-10-12 (×6): 40 meq via ORAL
  Filled 2023-10-10 (×6): qty 2

## 2023-10-10 MED ORDER — POTASSIUM CHLORIDE CRYS ER 20 MEQ PO TBCR: 60.0000 meq | EXTENDED_RELEASE_TABLET | Freq: Two times a day (BID) | ORAL | Status: DC

## 2023-10-10 MED ORDER — FUROSEMIDE 10 MG/ML IJ SOLN
60.0000 mg | Freq: Once | INTRAMUSCULAR | Status: AC
  Administered 2023-10-10: 60 mg via INTRAVENOUS
  Filled 2023-10-10: qty 6

## 2023-10-10 MED ORDER — SODIUM CHLORIDE 0.9 % IV SOLN: INTRAVENOUS | Status: DC

## 2023-10-10 NOTE — H&P (View-Only) (Signed)
 Advanced Heart Failure Rounding Note  Cardiologist: Dr. Gala Romney  Chief Complaint: SOB Subjective:    2.5L in UOP yesterday w/ IV Lasix.   Scr stable, 1.04. K 3.8  F/u BNP remains elevated but downtrending, 916>>373  Still feels tired and fatigued but breathing improving.    Objective:   Weight Range: 82.8 kg Body mass index is 23.44 kg/m.   Vital Signs:   Temp:  [97.7 F (36.5 C)-98.1 F (36.7 C)] 98.1 F (36.7 C) (04/10 0434) Pulse Rate:  [70-84] 77 (04/10 0300) Resp:  [18] 18 (04/10 0434) BP: (99-109)/(64-73) 99/69 (04/10 0300) SpO2:  [92 %-98 %] 98 % (04/10 0300) Weight:  [82.8 kg] 82.8 kg (04/10 0447) Last BM Date : 10/07/23  Weight change: Filed Weights   10/08/23 1436 10/09/23 0434 10/10/23 0447  Weight: 83.3 kg 83.7 kg 82.8 kg    Intake/Output:   Intake/Output Summary (Last 24 hours) at 10/10/2023 0710 Last data filed at 10/10/2023 0500 Gross per 24 hour  Intake 480 ml  Output 2450 ml  Net -1970 ml      Physical Exam    General:  fatigued appearing. No resp difficulty HEENT: Normal Neck: Supple. JVD 8-9 cm . Carotids 2+ bilat; no bruits. No lymphadenopathy or thyromegaly appreciated. Cor: PMI nondisplaced. Regular rate & rhythm. No rubs, gallops or murmurs. Lungs: Clear Abdomen: Soft, nontender, nondistended. No hepatosplenomegaly. No bruits or masses. Good bowel sounds. Extremities: No cyanosis, clubbing, rash, edema Neuro: Alert & orientedx3, cranial nerves grossly intact. moves all 4 extremities w/o difficulty. Affect pleasant   Telemetry   A-V paced 70s   EKG    No new EKG to review   Labs    CBC Recent Labs    10/07/23 1441 10/08/23 0530  WBC 5.2 4.8  NEUTROABS 3.7 3.3  HGB 9.8* 9.3*  HCT 32.0* 30.4*  MCV 90.4 89.9  PLT 230 206   Basic Metabolic Panel Recent Labs    52/84/13 0530 10/09/23 0224 10/10/23 0222  NA 140 138 138  K 3.6 3.6 3.8  CL 103 102 103  CO2 26 24 23   GLUCOSE 100* 106* 98  BUN 15 18 20    CREATININE 1.04* 1.03* 1.04*  CALCIUM 8.9 8.6* 8.9  MG 2.0 2.0 2.1  PHOS 4.3 3.9  --    Liver Function Tests Recent Labs    10/08/23 0530  AST 22  ALT 30  ALKPHOS 44  BILITOT 1.3*  PROT 6.5  ALBUMIN 3.4*   No results for input(s): "LIPASE", "AMYLASE" in the last 72 hours. Cardiac Enzymes No results for input(s): "CKTOTAL", "CKMB", "CKMBINDEX", "TROPONINI" in the last 72 hours.  BNP: BNP (last 3 results) Recent Labs    10/02/23 1123 10/07/23 1442 10/10/23 0222  BNP 473.8* 916.5* 373.5*    ProBNP (last 3 results) No results for input(s): "PROBNP" in the last 8760 hours.   D-Dimer No results for input(s): "DDIMER" in the last 72 hours. Hemoglobin A1C No results for input(s): "HGBA1C" in the last 72 hours. Fasting Lipid Panel No results for input(s): "CHOL", "HDL", "LDLCALC", "TRIG", "CHOLHDL", "LDLDIRECT" in the last 72 hours. Thyroid Function Tests No results for input(s): "TSH", "T4TOTAL", "T3FREE", "THYROIDAB" in the last 72 hours.  Invalid input(s): "FREET3"  Other results:   Imaging    No results found.   Medications:     Scheduled Medications:  apixaban  5 mg Oral BID   atorvastatin  10 mg Oral q AM   bisoprolol  2.5 mg Oral  Daily   digoxin  125 mcg Oral Daily   doxycycline  100 mg Oral Q12H   insulin aspart  0-9 Units Subcutaneous TID WC   midodrine  5 mg Oral TID WC   spironolactone  25 mg Oral Daily    Infusions:   PRN Medications: acetaminophen **OR** acetaminophen, albuterol, benzonatate, melatonin, ondansetron (ZOFRAN) IV, senna-docusate, traZODone    [name] w/ h/o nephrolithiasis s/p partial right nephrectomy, CVA, recently diagnosed PE and chronic systolic heart failure, LBBB s/p CRT-D non responder w/ most recent EF chronically low at 20-25%, multiple admissions for CHF in the last 3 months, readmitted w/ a/c CHF w/ NYHA Class IIIb symptoms.   Assessment/Plan   1. Acute on Chronic Combined  Systolic/Diastolic Heart Failure, NICM - 07/13/21 Echo 20-25%, RV normal, grade II DD , and septal -lateral dyssnchrony - 07/2021 cMRI possible myocarditis  LVEF 18% Suggestive of LBBB, RVEF 33% - [facility] 07/2021 min CAD. RA 6 PCWP 21, CO 6.8/ CI 3.1 - Echo(4/23): EF 30-35% -> EF improving but still with significant LV dysfunction (suspect resolving myocarditis) - Echo 10/23 EF < 20% RV ok  -RHC 11/23 w/ Mildly elevated filling pressures with mildly decreased CO - CPX 12/23 - submax test. Non diagnostic.  - Echo 12/06/22 EF 20-25%  RV normal.  - Echo 01/14/23 EF < 20% RV ok  - LBBB, QRS  176 ms. -> now s/p Abbott CRT-D 7/24 -> QRS  - Echo 11/24 EF 20-25%  - Echo 2/25 EF 20-25%, RV nl  - Admitted w/ NYHA Class IIIb-IV symptoms w/ volume overload  - Multiple admits for a/c CHF in last several months + down-titration of GDMT due to hypotension and unintentional wt loss/ signs of cardiac cachectica. Concern she is nearing end-stage HF. Needs w/u for advanced therapies. Think, currently she is too deconditioned for transplant. Will need to work w/ PT. May be good candidate for LVAD. RV normal. SCr ok. But will need assessment of pulmonary function.  - symptoms are improving w/ diuresis, SCr stable. Still mildly volume overloaded. Continue IV Lasix today, repeat 60 mg x 1 - plan RHC after diuresis, anticipate tomorrow   - continue spironolactone 25 mg daily  - continue bisoprolol 2.5 mg daily  - continue digoxin 0.125 mg daily  - off Entresto due to hypotension - BP too soft for other GDMT. Continue midodrine 5 mg tid - will need eventual PFTs, after diuresis, to see if she would be a VAD candidate    2. ? PNA On empiric abx per IM, doxycyline, based on CXR/CT, though suspect findings most likely 2/2 CHF. Respiratory panel negative and PCT also normal, < 0.10  - per IM, will continue Doxy x 5 day course    3. PE - diagnosed previous admit 3/25, small burden RLL. LE venous dopplers + for SVT.  No DVT   - on Eliquis. Repeat CT of chest this admit shows interval improvement of PE burden, diminished from initial scan. No new or progressive pulmonary emboli.  - continue Eliquis 5 mg bid    4. Fragility/Decondition - needs to ambulate more and will need to get stronger to be considered for transplant/VAD - PT/OT consulted    Length of Stay: 3  Lynn Estrada Delmer Islam  10/10/2023, 7:10 AM  Advanced Heart Failure Team Pager [phone] (M-F; 7a - 5p)  Please contact [facility] for night-coverage after hours (5p -7a ) and weekends on [email or web address]

## 2023-10-10 NOTE — Progress Notes (Signed)
 Pharmacist Heart Failure Core Measure Documentation  Assessment: Lynn Estrada has an EF documented as 20%  by ECHO.  Rationale: Heart failure patients with left ventricular systolic dysfunction (LVSD) and an EF < 40% should be prescribed an angiotensin converting enzyme inhibitor (ACEI) or angiotensin receptor blocker (ARB) at discharge unless a contraindication is documented in the medical record.  This patient is not currently on an ACEI or ARB for HF.  This note is being placed in the record in order to provide documentation that a contraindication to the use of these agents is present for this encounter.  ACE Inhibitor or Angiotensin Receptor Blocker is contraindicated (specify all that apply)  []   ACEI allergy AND ARB allergy []   Angioedema []   Moderate or severe aortic stenosis []   Hyperkalemia [x]   Hypotension []   Renal artery stenosis []   Worsening renal function, preexisting renal disease or dysfunction   Leota Sauers Pharm.D. CPP, BCPS Clinical Pharmacist [phone] 10/10/2023 7:57 AM

## 2023-10-10 NOTE — Plan of Care (Signed)

## 2023-10-10 NOTE — Evaluation (Signed)
 Occupational Therapy Evaluation Patient Details Name: PATTIJO Estrada MRN: [id number] DOB: Oct 26, 1960 Today's Date: 10/10/2023   History of Present Illness   Lynn Estrada is a 63 y.o. female admitted 10/07/23 for suspected HCAP. Pt presented with SOB. CTA chest showed right lower lobe pneumonia, pulmonary edema and effusion. PMH of acute PE, systolic CHF EF 25%., DM2, HLD, HTN, GERD, nephrolithiasis, partial right nephrectomy, LBBB, and anemia of chronic disease. Of note recent hospitalization 3/31-4/2 for RLL PE.     Clinical Impressions PTA, pt lives with family, typically Modified Independent with ADLs and mobility using cane vs RW. Pt presents now with deficits in activity tolerance but eager to mobilize. Pt requires no more than Supervision for ADL mgmt and hallway mobility using Rollator. Emphasis on energy conservation strategies with handout provided and pt hopeful to obtain Rollator to decrease fatigue with community IADLs. Anticipate no OT needs at DC but will follow acutely.     If plan is discharge home, recommend the following:   Assistance with cooking/housework;Assist for transportation     Functional Status Assessment   Patient has had a recent decline in their functional status and demonstrates the ability to make significant improvements in function in a reasonable and predictable amount of time.     Equipment Recommendations   Other (comment) (Rollator (one adjustable to pt's taller height))     Recommendations for Other Services         Precautions/Restrictions   Precautions Precautions: Fall Recall of Precautions/Restrictions: Intact Restrictions Weight Bearing Restrictions Per Provider Order: No     Mobility Bed Mobility Overal bed mobility: Modified Independent                  Transfers Overall transfer level: Modified independent Equipment used: Rollator (4 wheels) Transfers: Sit to/from Stand Sit to Stand: Modified  independent (Device/Increase time)           General transfer comment: good brake use, declined need for elevated bed height to stand      Balance Overall balance assessment: Needs assistance Sitting-balance support: No upper extremity supported Sitting balance-Leahy Scale: Good     Standing balance support: Bilateral upper extremity supported, During functional activity, Reliant on assistive device for balance Standing balance-Leahy Scale: Fair                             ADL either performed or assessed with clinical judgement   ADL Overall ADL's : Needs assistance/impaired Eating/Feeding: Independent   Grooming: Supervision/safety;Standing   Upper Body Bathing: Set up;Sitting   Lower Body Bathing: Supervison/ safety;Sitting/lateral leans;Sit to/from stand   Upper Body Dressing : Set up;Sitting   Lower Body Dressing: Supervision/safety;Sitting/lateral leans;Sit to/from stand   Toilet Transfer: Supervision/safety;Ambulation;Rollator (4 wheels)   Toileting- Clothing Manipulation and Hygiene: Supervision/safety;Sitting/lateral lean;Sit to/from stand       Functional mobility during ADLs: Supervision/safety;Rolling walker (2 wheels) General ADL Comments: Emphasis on energy conservation, gradual improvements in endurance and pt eager to mobilize in hallway with Rollator. Good carryover of brake mgmt and safety with this device     Vision Baseline Vision/History: 1 Wears glasses Ability to See in Adequate Light: 0 Adequate Patient Visual Report: No change from baseline Vision Assessment?: No apparent visual deficits     Perception         Praxis         Pertinent Vitals/Pain Pain Assessment Pain Assessment: No/denies pain     Extremity/Trunk  Assessment Upper Extremity Assessment Upper Extremity Assessment: Overall WFL for tasks assessed;Right hand dominant   Lower Extremity Assessment Lower Extremity Assessment: Defer to PT evaluation    Cervical / Trunk Assessment Cervical / Trunk Assessment: Normal   Communication Communication Communication: No apparent difficulties   Cognition Arousal: Alert Behavior During Therapy: WFL for tasks assessed/performed Cognition: No apparent impairments                               Following commands: Intact       Cueing  General Comments   Cueing Techniques: Verbal cues  RN in to give BP meds with BP 113/79 post activity   Exercises     Shoulder Instructions      Home Living Family/patient expects to be discharged to:: Private residence Living Arrangements: Spouse/significant other;Children Available Help at Discharge: Family;Available 24 hours/day Type of Home: House Home Access: Stairs to enter [employer] of Steps: 1 Entrance Stairs-Rails: None Home Layout: One level     Bathroom Shower/Tub: Tub/shower unit;Sponge bathes at baseline (garden tub)   Bathroom Toilet: Handicapped height Bathroom Accessibility: Yes How Accessible: Accessible via walker;Accessible via wheelchair Home Equipment: Rolling Walker (2 wheels);Cane - single point;Crutches          Prior Functioning/Environment Prior Level of Function : Independent/Modified Independent             Mobility Comments: Ambulates using RW or SPC. Denies falls in the last 25mo. ADLs Comments: modI with ADLs, takes increased time. Pt reports unable to safely transfer in/out of garden tub so has been sponge bathing. Pt relies on family for household management and transportation.    OT Problem List: Decreased activity tolerance   OT Treatment/Interventions: Self-care/ADL training;Therapeutic exercise;Energy conservation;DME and/or AE instruction;Therapeutic activities;Patient/family education;Balance training      OT Goals(Current goals can be found in the care plan section)   Acute Rehab OT Goals Patient Stated Goal: move around more, get a rollator, increase strength OT  Goal Formulation: With patient Time For Goal Achievement: 10/24/23 Potential to Achieve Goals: Good ADL Goals Pt/caregiver will Perform Home Exercise Program: Increased strength;Both right and left upper extremity;With theraband;With written HEP provided Additional ADL Goal #1: Pt to implement at least 3 energy conservation strategies during ADLs, IADLs and mobility   OT Frequency:  Min 2X/week    Co-evaluation              AM-PAC OT "6 Clicks" Daily Activity     Outcome Measure Help from another person eating meals?: None Help from another person taking care of personal grooming?: None Help from another person toileting, which includes using toliet, bedpan, or urinal?: A [employer] Help from another person bathing (including washing, rinsing, drying)?: A [place] Help from another person to put on and taking off regular upper body clothing?: None Help from another person to put on and taking off regular lower body clothing?: A [place] 6 Click Score: 21   End of Session Equipment Utilized During Treatment: Gait belt;Rollator (4 wheels) Nurse Communication: Mobility status  Activity Tolerance: Patient tolerated treatment well Patient left: in bed;with call bell/phone within reach;Other (comment) (EOB with breakfast)  OT Visit Diagnosis: Muscle weakness (generalized) (M62.81);Pain                Time: 1610-9604 OT Time Calculation (min): 20 min Charges:  OT General Charges $OT Visit: 1 Visit OT Evaluation $OT Eval Low Complexity: 1 Low  Raynelle Fanning  B, OTR/L Acute Rehab Services Office: [phone]   Lorre Munroe 10/10/2023, 9:56 AM

## 2023-10-10 NOTE — Progress Notes (Signed)
 PROGRESS NOTE    Lynn Estrada  WUJ:[id number] DOB: 1960/10/10 DOA: 10/07/2023 PCP: Porfirio Oar, PA    Brief Narrative:   63 year old with history of acute PE, systolic CHF EF 25%., DM2, HLD, anemia of chronic disease, admitted to [facility] recently for pneumonia now being admitted again for shortness of breath with concerns of fluid overload recurrent HCAP.  During recent admission patient completed 5 days of cefepime and azithromycin.  CTA chest showed right lower lobe pneumonia, pulmonary edema and effusion.  Eventually thought to be in fluid overload therefore started on diuresis, cardiology team consulted.  Assessment & Plan:  Principal Problem:   HCAP (healthcare-associated pneumonia) Active Problems:   HLD (hyperlipidemia)   Acute on chronic combined systolic and diastolic CHF (congestive heart failure) (HCC)   SOB (shortness of breath)   History of pulmonary embolism   DM2 (diabetes mellitus, type 2) (HCC)   Anemia of chronic disease   Acute congestive heart failure with reduced EF, 25% Status post BiV PPM History of nonobstructive CAD - Elevated BNP.  Receiving Lasix IV.  Responding well to this.  Echocardiogram done in February 2025.  Cardiology consulted, planning on right heart cath - GDMT per cardiology    Acute respiratory distress Healthcare acquired pneumonia - CTA chest-reviewed.  Elevated BNP.  Procalcitonin is negative.  Very low suspicion for pneumonia.  Bronchodilators, I-S/flutter valve.  Will complete 5-day course of doxycycline.  Respiratory panel is negative  History of pulmonary embolism - On Eliquis  Diabetes mellitus type 2 - Accu-Cheks and sliding scale  Hyperlipidemia - Statin  Anemia of chronic disease - Baseline hemoglobin  History of CVA - November 2024.  Now on Eliquis, Plavix have been discontinued since previous admission   DVT prophylaxis: apixaban (ELIQUIS) tablet 5 mg     Code Status: Full Code Family  Communication:   Status is: Inpatient Discharge once cleared by cardiology RHC planned tomorrow   Subjective:  Feeling better.  DOE improved  Examination:  General exam: Appears calm and comfortable  Respiratory system: Diminished breath sounds at the bases Cardiovascular system: S1 & S2 heard, RRR. No JVD, murmurs, rubs, gallops or clicks. No pedal edema. Gastrointestinal system: Abdomen is nondistended, soft and nontender. No organomegaly or masses felt. Normal bowel sounds heard. Central nervous system: Alert and oriented. No focal neurological deficits. Extremities: Symmetric 5 x 5 power. Skin: No rashes, lesions or ulcers Psychiatry: Judgement and insight appear normal. Mood & affect appropriate.                Diet Orders (From admission, onward)     Start     Ordered   10/10/23 0743  Diet heart healthy/carb modified Room service appropriate? Yes; Fluid consistency: Thin; Fluid restriction: 2000 mL Fluid  Diet effective now       Question Answer Comment  Diet-HS Snack? Nothing   Room service appropriate? Yes   Fluid consistency: Thin   Fluid restriction: 2000 mL Fluid      10/10/23 0742            Objective: Vitals:   10/10/23 0300 10/10/23 0434 10/10/23 0447 10/10/23 0755  BP: 99/69   94/65  Pulse: 77   77  Resp: 18 18  17   Temp: 98.1 F (36.7 C) 98.1 F (36.7 C)  98 F (36.7 C)  TempSrc: Oral Oral  Oral  SpO2: 98%   97%  Weight:   82.8 kg   Height:  Intake/Output Summary (Last 24 hours) at 10/10/2023 1125 Last data filed at 10/10/2023 0900 Gross per 24 hour  Intake 600 ml  Output 1900 ml  Net -1300 ml   Filed Weights   10/08/23 1436 10/09/23 0434 10/10/23 0447  Weight: 83.3 kg 83.7 kg 82.8 kg    Scheduled Meds:  apixaban  5 mg Oral BID   atorvastatin  10 mg Oral q AM   bisoprolol  2.5 mg Oral Daily   digoxin  125 mcg Oral Daily   doxycycline  100 mg Oral Q12H   insulin aspart  0-9 Units Subcutaneous TID WC   midodrine   5 mg Oral TID WC   potassium chloride  40 mEq Oral BID   spironolactone  25 mg Oral Daily   Continuous Infusions:  Nutritional status     Body mass index is 23.44 kg/m.  Data Reviewed:   CBC: Recent Labs  Lab 10/07/23 1441 10/08/23 0530  WBC 5.2 4.8  NEUTROABS 3.7 3.3  HGB 9.8* 9.3*  HCT 32.0* 30.4*  MCV 90.4 89.9  PLT 230 206   Basic Metabolic Panel: Recent Labs  Lab 10/07/23 1441 10/08/23 0530 10/09/23 0224 10/10/23 0222  NA 140 140 138 138  K 4.0 3.6 3.6 3.8  CL 106 103 102 103  CO2 23 26 24 23   GLUCOSE 118* 100* 106* 98  BUN 16 15 18 20   CREATININE 0.96 1.04* 1.03* 1.04*  CALCIUM 9.2 8.9 8.6* 8.9  MG  --  2.0 2.0 2.1  PHOS  --  4.3 3.9  --    GFR: Estimated Creatinine Clearance: 69.7 mL/min (A) (by C-G formula based on SCr of 1.04 mg/dL (H)). Liver Function Tests: Recent Labs  Lab 10/08/23 0530  AST 22  ALT 30  ALKPHOS 44  BILITOT 1.3*  PROT 6.5  ALBUMIN 3.4*   No results for input(s): "LIPASE", "AMYLASE" in the last 168 hours. No results for input(s): "AMMONIA" in the last 168 hours. Coagulation Profile: No results for input(s): "INR", "PROTIME" in the last 168 hours. Cardiac Enzymes: No results for input(s): "CKTOTAL", "CKMB", "CKMBINDEX", "TROPONINI" in the last 168 hours. BNP (last 3 results) No results for input(s): "PROBNP" in the last 8760 hours. HbA1C: No results for input(s): "HGBA1C" in the last 72 hours. CBG: Recent Labs  Lab 10/08/23 2132 10/09/23 0646 10/09/23 1121 10/09/23 1554 10/10/23 0550  GLUCAP 121* 132* 176* 125* 98   Lipid Profile: No results for input(s): "CHOL", "HDL", "LDLCALC", "TRIG", "CHOLHDL", "LDLDIRECT" in the last 72 hours. Thyroid Function Tests: No results for input(s): "TSH", "T4TOTAL", "FREET4", "T3FREE", "THYROIDAB" in the last 72 hours. Anemia Panel: No results for input(s): "VITAMINB12", "FOLATE", "FERRITIN", "TIBC", "IRON", "RETICCTPCT" in the last 72 hours. Sepsis Labs: Recent Labs  Lab  10/07/23 1457 10/08/23 0530  PROCALCITON  --  <0.10  LATICACIDVEN 0.7  --     Recent Results (from the past 240 hours)  Resp panel by RT-PCR (RSV, Flu A&B, Covid) Anterior Nasal Swab     Status: None   Collection Time: 09/30/23 12:29 PM   Specimen: Anterior Nasal Swab  Result Value Ref Range Status   SARS Coronavirus 2 by RT PCR NEGATIVE NEGATIVE Final    Comment: (NOTE) SARS-CoV-2 target nucleic acids are NOT DETECTED.  The SARS-CoV-2 RNA is generally detectable in upper respiratory specimens during the acute phase of infection. The lowest concentration of SARS-CoV-2 viral copies this assay can detect is 138 copies/mL. A negative result does not preclude SARS-Cov-2 infection and  should not be used as the sole basis for treatment or other patient management decisions. A negative result may occur with  improper specimen collection/handling, submission of specimen other than nasopharyngeal swab, presence of viral mutation(s) within the areas targeted by this assay, and inadequate number of viral copies(<138 copies/mL). A negative result must be combined with clinical observations, patient history, and epidemiological information. The expected result is Negative.  Fact Sheet for Patients:  BloggerCourse.com  Fact Sheet for Healthcare Providers:  SeriousBroker.it  This test is no t yet approved or cleared by the [place] FDA and  has been authorized for detection and/or diagnosis of SARS-CoV-2 by FDA under an Emergency Use Authorization (EUA). This EUA will remain  in effect (meaning this test can be used) for the duration of the COVID-19 declaration under Section 564(b)(1) of the Act, 21 U.S.C.section 360bbb-3(b)(1), unless the authorization is terminated  or revoked sooner.       Influenza A by PCR NEGATIVE NEGATIVE Final   Influenza B by PCR NEGATIVE NEGATIVE Final    Comment: (NOTE) The Xpert Xpress  SARS-CoV-2/FLU/RSV plus assay is intended as an aid in the diagnosis of influenza from Nasopharyngeal swab specimens and should not be used as a sole basis for treatment. Nasal washings and aspirates are unacceptable for Xpert Xpress SARS-CoV-2/FLU/RSV testing.  Fact Sheet for Patients: BloggerCourse.com  Fact Sheet for Healthcare Providers: SeriousBroker.it  This test is not yet approved or cleared by the [place] FDA and has been authorized for detection and/or diagnosis of SARS-CoV-2 by FDA under an Emergency Use Authorization (EUA). This EUA will remain in effect (meaning this test can be used) for the duration of the COVID-19 declaration under Section 564(b)(1) of the Act, 21 U.S.C. section 360bbb-3(b)(1), unless the authorization is terminated or revoked.     Resp Syncytial Virus by PCR NEGATIVE NEGATIVE Final    Comment: (NOTE) Fact Sheet for Patients: BloggerCourse.com  Fact Sheet for Healthcare Providers: SeriousBroker.it  This test is not yet approved or cleared by the [place] FDA and has been authorized for detection and/or diagnosis of SARS-CoV-2 by FDA under an Emergency Use Authorization (EUA). This EUA will remain in effect (meaning this test can be used) for the duration of the COVID-19 declaration under Section 564(b)(1) of the Act, 21 U.S.C. section 360bbb-3(b)(1), unless the authorization is terminated or revoked.  Performed at [facility], [address] [place], [place] [address]   Respiratory (~20 pathogens) panel by PCR     Status: None   Collection Time: 10/08/23  3:46 PM   Specimen: Nasopharyngeal Swab; Respiratory  Result Value Ref Range Status   Adenovirus NOT DETECTED NOT DETECTED Final   Coronavirus 229E NOT DETECTED NOT DETECTED Final    Comment: (NOTE) The Coronavirus on the Respiratory Panel, DOES NOT test for  the novel  Coronavirus (2019 nCoV)    Coronavirus HKU1 NOT DETECTED NOT DETECTED Final   Coronavirus NL63 NOT DETECTED NOT DETECTED Final   Coronavirus OC43 NOT DETECTED NOT DETECTED Final   Metapneumovirus NOT DETECTED NOT DETECTED Final   Rhinovirus / Enterovirus NOT DETECTED NOT DETECTED Final   Influenza A NOT DETECTED NOT DETECTED Final   Influenza B NOT DETECTED NOT DETECTED Final   Parainfluenza Virus 1 NOT DETECTED NOT DETECTED Final   Parainfluenza Virus 2 NOT DETECTED NOT DETECTED Final   Parainfluenza Virus 3 NOT DETECTED NOT DETECTED Final   Parainfluenza Virus 4 NOT DETECTED NOT DETECTED Final   Respiratory Syncytial Virus  NOT DETECTED NOT DETECTED Final   Bordetella pertussis NOT DETECTED NOT DETECTED Final   Bordetella Parapertussis NOT DETECTED NOT DETECTED Final   Chlamydophila pneumoniae NOT DETECTED NOT DETECTED Final   Mycoplasma pneumoniae NOT DETECTED NOT DETECTED Final    Comment: Performed at [facility], [address], [place], [place] [address]         Radiology Studies: No results found.         LOS: 3 days   Time spent= 35 mins    Miguel Rota, MD [facility]  If 7PM-7AM, please contact night-coverage  10/10/2023, 11:25 AM

## 2023-10-10 NOTE — Plan of Care (Signed)
   Problem: Fluid Volume: Goal: Ability to maintain a balanced intake and output will improve Outcome: Progressing

## 2023-10-10 NOTE — Progress Notes (Signed)
 Advanced Heart Failure Rounding Note  Cardiologist: Dr. Gala Romney  Chief Complaint: SOB Subjective:    2.5L in UOP yesterday w/ IV Lasix.   Scr stable, 1.04. K 3.8  F/u BNP remains elevated but downtrending, 916>>373  Still feels tired and fatigued but breathing improving.    Objective:   Weight Range: 82.8 kg Body mass index is 23.44 kg/m.   Vital Signs:   Temp:  [97.7 F (36.5 C)-98.1 F (36.7 C)] 98.1 F (36.7 C) (04/10 0434) Pulse Rate:  [70-84] 77 (04/10 0300) Resp:  [18] 18 (04/10 0434) BP: (99-109)/(64-73) 99/69 (04/10 0300) SpO2:  [92 %-98 %] 98 % (04/10 0300) Weight:  [82.8 kg] 82.8 kg (04/10 0447) Last BM Date : 10/07/23  Weight change: Filed Weights   10/08/23 1436 10/09/23 0434 10/10/23 0447  Weight: 83.3 kg 83.7 kg 82.8 kg    Intake/Output:   Intake/Output Summary (Last 24 hours) at 10/10/2023 0710 Last data filed at 10/10/2023 0500 Gross per 24 hour  Intake 480 ml  Output 2450 ml  Net -1970 ml      Physical Exam    General:  fatigued appearing. No resp difficulty HEENT: Normal Neck: Supple. JVD 8-9 cm . Carotids 2+ bilat; no bruits. No lymphadenopathy or thyromegaly appreciated. Cor: PMI nondisplaced. Regular rate & rhythm. No rubs, gallops or murmurs. Lungs: Clear Abdomen: Soft, nontender, nondistended. No hepatosplenomegaly. No bruits or masses. Good bowel sounds. Extremities: No cyanosis, clubbing, rash, edema Neuro: Alert & orientedx3, cranial nerves grossly intact. moves all 4 extremities w/o difficulty. Affect pleasant   Telemetry   A-V paced 70s   EKG    No new EKG to review   Labs    CBC Recent Labs    10/07/23 1441 10/08/23 0530  WBC 5.2 4.8  NEUTROABS 3.7 3.3  HGB 9.8* 9.3*  HCT 32.0* 30.4*  MCV 90.4 89.9  PLT 230 206   Basic Metabolic Panel Recent Labs    52/84/13 0530 10/09/23 0224 10/10/23 0222  NA 140 138 138  K 3.6 3.6 3.8  CL 103 102 103  CO2 26 24 23   GLUCOSE 100* 106* 98  BUN 15 18 20    CREATININE 1.04* 1.03* 1.04*  CALCIUM 8.9 8.6* 8.9  MG 2.0 2.0 2.1  PHOS 4.3 3.9  --    Liver Function Tests Recent Labs    10/08/23 0530  AST 22  ALT 30  ALKPHOS 44  BILITOT 1.3*  PROT 6.5  ALBUMIN 3.4*   No results for input(s): "LIPASE", "AMYLASE" in the last 72 hours. Cardiac Enzymes No results for input(s): "CKTOTAL", "CKMB", "CKMBINDEX", "TROPONINI" in the last 72 hours.  BNP: BNP (last 3 results) Recent Labs    10/02/23 1123 10/07/23 1442 10/10/23 0222  BNP 473.8* 916.5* 373.5*    ProBNP (last 3 results) No results for input(s): "PROBNP" in the last 8760 hours.   D-Dimer No results for input(s): "DDIMER" in the last 72 hours. Hemoglobin A1C No results for input(s): "HGBA1C" in the last 72 hours. Fasting Lipid Panel No results for input(s): "CHOL", "HDL", "LDLCALC", "TRIG", "CHOLHDL", "LDLDIRECT" in the last 72 hours. Thyroid Function Tests No results for input(s): "TSH", "T4TOTAL", "T3FREE", "THYROIDAB" in the last 72 hours.  Invalid input(s): "FREET3"  Other results:   Imaging    No results found.   Medications:     Scheduled Medications:  apixaban  5 mg Oral BID   atorvastatin  10 mg Oral q AM   bisoprolol  2.5 mg Oral  Daily   digoxin  125 mcg Oral Daily   doxycycline  100 mg Oral Q12H   insulin aspart  0-9 Units Subcutaneous TID WC   midodrine  5 mg Oral TID WC   spironolactone  25 mg Oral Daily    Infusions:   PRN Medications: acetaminophen **OR** acetaminophen, albuterol, benzonatate, melatonin, ondansetron (ZOFRAN) IV, senna-docusate, traZODone    [name] w/ h/o nephrolithiasis s/p partial right nephrectomy, CVA, recently diagnosed PE and chronic systolic heart failure, LBBB s/p CRT-D non responder w/ most recent EF chronically low at 20-25%, multiple admissions for CHF in the last 3 months, readmitted w/ a/c CHF w/ NYHA Class IIIb symptoms.   Assessment/Plan   1. Acute on Chronic Combined  Systolic/Diastolic Heart Failure, NICM - 07/13/21 Echo 20-25%, RV normal, grade II DD , and septal -lateral dyssnchrony - 07/2021 cMRI possible myocarditis  LVEF 18% Suggestive of LBBB, RVEF 33% - [facility] 07/2021 min CAD. RA 6 PCWP 21, CO 6.8/ CI 3.1 - Echo(4/23): EF 30-35% -> EF improving but still with significant LV dysfunction (suspect resolving myocarditis) - Echo 10/23 EF < 20% RV ok  -RHC 11/23 w/ Mildly elevated filling pressures with mildly decreased CO - CPX 12/23 - submax test. Non diagnostic.  - Echo 12/06/22 EF 20-25%  RV normal.  - Echo 01/14/23 EF < 20% RV ok  - LBBB, QRS  176 ms. -> now s/p Abbott CRT-D 7/24 -> QRS  - Echo 11/24 EF 20-25%  - Echo 2/25 EF 20-25%, RV nl  - Admitted w/ NYHA Class IIIb-IV symptoms w/ volume overload  - Multiple admits for a/c CHF in last several months + down-titration of GDMT due to hypotension and unintentional wt loss/ signs of cardiac cachectica. Concern she is nearing end-stage HF. Needs w/u for advanced therapies. Think, currently she is too deconditioned for transplant. Will need to work w/ PT. May be good candidate for LVAD. RV normal. SCr ok. But will need assessment of pulmonary function.  - symptoms are improving w/ diuresis, SCr stable. Still mildly volume overloaded. Continue IV Lasix today, repeat 60 mg x 1 - plan RHC after diuresis, anticipate tomorrow   - continue spironolactone 25 mg daily  - continue bisoprolol 2.5 mg daily  - continue digoxin 0.125 mg daily  - off Entresto due to hypotension - BP too soft for other GDMT. Continue midodrine 5 mg tid - will need eventual PFTs, after diuresis, to see if she would be a VAD candidate    2. ? PNA On empiric abx per IM, doxycyline, based on CXR/CT, though suspect findings most likely 2/2 CHF. Respiratory panel negative and PCT also normal, < 0.10  - per IM, will continue Doxy x 5 day course    3. PE - diagnosed previous admit 3/25, small burden RLL. LE venous dopplers + for SVT.  No DVT   - on Eliquis. Repeat CT of chest this admit shows interval improvement of PE burden, diminished from initial scan. No new or progressive pulmonary emboli.  - continue Eliquis 5 mg bid    4. Fragility/Decondition - needs to ambulate more and will need to get stronger to be considered for transplant/VAD - PT/OT consulted    Length of Stay: 3  Lynn Estrada Lynn Estrada  10/10/2023, 7:10 AM  Advanced Heart Failure Team Pager [phone] (M-F; 7a - 5p)  Please contact [facility] for night-coverage after hours (5p -7a ) and weekends on [email or web address]

## 2023-10-11 ENCOUNTER — Ambulatory Visit (HOSPITAL_COMMUNITY): Admission: RE | Admit: 2023-10-11 | Source: Home / Self Care | Admitting: Cardiology

## 2023-10-11 ENCOUNTER — Ambulatory Visit: Admitting: Physical Therapy

## 2023-10-11 ENCOUNTER — Encounter (HOSPITAL_COMMUNITY)
Admission: EM | Disposition: A | Discharge status: Home / Self Care | Payer: Self-pay | Source: Home / Self Care | Attending: Internal Medicine

## 2023-10-11 DIAGNOSIS — J189 Pneumonia, unspecified organism: Secondary | ICD-10-CM | POA: Diagnosis not present

## 2023-10-11 DIAGNOSIS — I5043 Acute on chronic combined systolic (congestive) and diastolic (congestive) heart failure: Secondary | ICD-10-CM | POA: Diagnosis not present

## 2023-10-11 HISTORY — PX: RIGHT HEART CATH: CATH118263

## 2023-10-11 LAB — POCT I-STAT EG7
Acid-Base Excess: 1 mmol/L (ref 0.0–2.0)
Acid-Base Excess: 1 mmol/L (ref 0.0–2.0)
Bicarbonate: 25.8 mmol/L (ref 20.0–28.0)
Bicarbonate: 25.9 mmol/L (ref 20.0–28.0)
Calcium, Ion: 1.23 mmol/L (ref 1.15–1.40)
Calcium, Ion: 1.23 mmol/L (ref 1.15–1.40)
HCT: 31 % — ABNORMAL LOW (ref 36.0–46.0)
HCT: 31 % — ABNORMAL LOW (ref 36.0–46.0)
Hemoglobin: 10.5 g/dL — ABNORMAL LOW (ref 12.0–15.0)
Hemoglobin: 10.5 g/dL — ABNORMAL LOW (ref 12.0–15.0)
O2 Saturation: 60 %
O2 Saturation: 60 %
Potassium: 4.3 mmol/L (ref 3.5–5.1)
Potassium: 4.3 mmol/L (ref 3.5–5.1)
Sodium: 142 mmol/L (ref 135–145)
Sodium: 142 mmol/L (ref 135–145)
TCO2: 27 mmol/L (ref 22–32)
TCO2: 27 mmol/L (ref 22–32)
pCO2, Ven: 42 mmHg — ABNORMAL LOW (ref 44–60)
pCO2, Ven: 42.8 mmHg — ABNORMAL LOW (ref 44–60)
pH, Ven: 7.39 (ref 7.25–7.43)
pH, Ven: 7.396 (ref 7.25–7.43)
pO2, Ven: 31 mmHg — CL (ref 32–45)
pO2, Ven: 32 mmHg (ref 32–45)

## 2023-10-11 LAB — BASIC METABOLIC PANEL WITH GFR
Anion gap: 9 (ref 5–15)
BUN: 25 mg/dL — ABNORMAL HIGH (ref 8–23)
CO2: 24 mmol/L (ref 22–32)
Calcium: 9 mg/dL (ref 8.9–10.3)
Chloride: 107 mmol/L (ref 98–111)
Creatinine, Ser: 1.01 mg/dL — ABNORMAL HIGH (ref 0.44–1.00)
GFR, Estimated: >60 mL/min (ref >60)
Glucose, Bld: 107 mg/dL — ABNORMAL HIGH (ref 70–99)
Potassium: 4.4 mmol/L (ref 3.5–5.1)
Sodium: 140 mmol/L (ref 135–145)

## 2023-10-11 LAB — GLUCOSE, CAPILLARY
Glucose-Capillary: 107 mg/dL — ABNORMAL HIGH (ref 70–99)
Glucose-Capillary: 116 mg/dL — ABNORMAL HIGH (ref 70–99)
Glucose-Capillary: 124 mg/dL — ABNORMAL HIGH (ref 70–99)
Glucose-Capillary: 124 mg/dL — ABNORMAL HIGH (ref 70–99)

## 2023-10-11 LAB — MAGNESIUM: Magnesium: 2.3 mg/dL (ref 1.7–2.4)

## 2023-10-11 SURGERY — RIGHT HEART CATH
Anesthesia: LOCAL

## 2023-10-11 MED ORDER — ACETAZOLAMIDE 250 MG PO TABS
500.0000 mg | ORAL_TABLET | Freq: Once | ORAL | Status: AC
  Administered 2023-10-11: 500 mg via ORAL
  Filled 2023-10-11: qty 2

## 2023-10-11 MED ORDER — ENSURE ENLIVE PO LIQD
237.0000 mL | Freq: Two times a day (BID) | ORAL | Status: DC
  Administered 2023-10-11 – 2023-10-14 (×7): 237 mL via ORAL

## 2023-10-11 MED ORDER — FUROSEMIDE 10 MG/ML IJ SOLN
80.0000 mg | Freq: Two times a day (BID) | INTRAMUSCULAR | Status: DC
  Administered 2023-10-11 – 2023-10-12 (×4): 80 mg via INTRAVENOUS
  Filled 2023-10-11 (×4): qty 8

## 2023-10-11 MED ORDER — LIDOCAINE HCL (PF) 1 % IJ SOLN
INTRAMUSCULAR | Status: AC
  Filled 2023-10-11: qty 30

## 2023-10-11 MED ORDER — HEPARIN (PORCINE) IN NACL 1000-0.9 UT/500ML-% IV SOLN
INTRAVENOUS | Status: DC | PRN
  Administered 2023-10-11: 500 mL

## 2023-10-11 MED ORDER — LIDOCAINE HCL (PF) 1 % IJ SOLN
INTRAMUSCULAR | Status: DC | PRN
Start: 2023-10-11 — End: 2023-10-11
  Administered 2023-10-11: 2 mL

## 2023-10-11 SURGICAL SUPPLY — 5 items
CATH SWAN GANZ 7F STRAIGHT (CATHETERS) IMPLANT
GLIDESHEATH SLENDER 7FR .021G (SHEATH) IMPLANT
PACK CARDIAC CATHETERIZATION (CUSTOM PROCEDURE TRAY) ×1 IMPLANT
TRANSDUCER W/STOPCOCK (MISCELLANEOUS) IMPLANT
TUBING ART PRESS 72 MALE/FEM (TUBING) IMPLANT

## 2023-10-11 NOTE — Progress Notes (Signed)
 PROGRESS NOTE    Lynn Estrada  ION:[id number] DOB: 12/04/1960 DOA: 10/07/2023 PCP: Porfirio Oar, PA    Brief Narrative:   63 year old with history of acute PE, systolic CHF EF 25%., DM2, HLD, anemia of chronic disease, admitted to [facility] recently for pneumonia now being admitted again for shortness of breath with concerns of fluid overload recurrent HCAP.  During recent admission patient completed 5 days of cefepime and azithromycin.  CTA chest showed right lower lobe pneumonia, pulmonary edema and effusion.  Eventually thought to be in fluid overload therefore started on diuresis, cardiology team consulted.  RHC done 4/11 showing volume overload, continue diuresis through the weekend.  Assessment & Plan:  Principal Problem:   HCAP (healthcare-associated pneumonia) Active Problems:   HLD (hyperlipidemia)   Acute on chronic combined systolic and diastolic CHF (congestive heart failure) (HCC)   SOB (shortness of breath)   History of pulmonary embolism   DM2 (diabetes mellitus, type 2) (HCC)   Anemia of chronic disease   Acute congestive heart failure with reduced EF, 25% Status post BiV PPM History of nonobstructive CAD - Elevated BNP.  Receiving Lasix IV.  Responding well to this.  Echocardiogram done in February 2025.  CHF team following.  RHC today shows elevated pressures.  Will continue diuresis through the weekend - GDMT per cardiology    Acute respiratory distress Healthcare acquired pneumonia - CTA chest-reviewed.  Elevated BNP.  Procalcitonin is negative.  Very low suspicion for pneumonia.  Bronchodilators, I-S/flutter valve.  Will complete 5-day course of doxycycline.  Respiratory panel is negative  History of pulmonary embolism - On Eliquis  Diabetes mellitus type 2 - Accu-Cheks and sliding scale  Hyperlipidemia - Statin  Anemia of chronic disease - Baseline hemoglobin  History of CVA - November 2024.  Now on Eliquis, Plavix have been  discontinued since previous admission   DVT prophylaxis: apixaban (ELIQUIS) tablet 5 mg     Code Status: Full Code Family Communication:   Status is: Inpatient Plans to continue diuresis through the weekend per cardiology   Subjective:  Feeling better, orthopnea has improved  Examination:  General exam: Appears calm and comfortable  Respiratory system: Diminished breath sounds at the bases Cardiovascular system: S1 & S2 heard, RRR. No JVD, murmurs, rubs, gallops or clicks. No pedal edema. Gastrointestinal system: Abdomen is nondistended, soft and nontender. No organomegaly or masses felt. Normal bowel sounds heard. Central nervous system: Alert and oriented. No focal neurological deficits. Extremities: Symmetric 5 x 5 power. Skin: No rashes, lesions or ulcers Psychiatry: Judgement and insight appear normal. Mood & affect appropriate.                Diet Orders (From admission, onward)     Start     Ordered   10/11/23 0912  Diet Heart Room service appropriate? Yes; Fluid consistency: Thin  Diet effective now       Question Answer Comment  Room service appropriate? Yes   Fluid consistency: Thin      10/11/23 0911            Objective: Vitals:   10/11/23 0829 10/11/23 0846 10/11/23 1024 10/11/23 1143  BP: 119/82 111/87 99/62 (!) 90/57  Pulse: 80 78  77  Resp: (!) 28 20  16   Temp:  98 F (36.7 C)  97.9 F (36.6 C)  TempSrc:  Oral  Oral  SpO2: (!) 89% 91%  100%  Weight:      Height:  Intake/Output Summary (Last 24 hours) at 10/11/2023 1157 Last data filed at 10/11/2023 1000 Gross per 24 hour  Intake 364.26 ml  Output 1000 ml  Net -635.74 ml   Filed Weights   10/09/23 0434 10/10/23 0447 10/11/23 0420  Weight: 83.7 kg 82.8 kg 82 kg    Scheduled Meds:  acetaZOLAMIDE  500 mg Oral Once   apixaban  5 mg Oral BID   atorvastatin  10 mg Oral q AM   bisoprolol  2.5 mg Oral Daily   digoxin  125 mcg Oral Daily   doxycycline  100 mg Oral Q12H    feeding supplement  237 mL Oral BID BM   furosemide  80 mg Intravenous BID   insulin aspart  0-9 Units Subcutaneous TID WC   midodrine  5 mg Oral TID WC   potassium chloride  40 mEq Oral BID   spironolactone  25 mg Oral Daily   Continuous Infusions:  Nutritional status     Body mass index is 23.21 kg/m.  Data Reviewed:   CBC: Recent Labs  Lab 10/07/23 1441 10/08/23 0530  WBC 5.2 4.8  NEUTROABS 3.7 3.3  HGB 9.8* 9.3*  HCT 32.0* 30.4*  MCV 90.4 89.9  PLT 230 206   Basic Metabolic Panel: Recent Labs  Lab 10/07/23 1441 10/08/23 0530 10/09/23 0224 10/10/23 0222 10/11/23 0301  NA 140 140 138 138 140  K 4.0 3.6 3.6 3.8 4.4  CL 106 103 102 103 107  CO2 23 26 24 23 24   GLUCOSE 118* 100* 106* 98 107*  BUN 16 15 18 20  25*  CREATININE 0.96 1.04* 1.03* 1.04* 1.01*  CALCIUM 9.2 8.9 8.6* 8.9 9.0  MG  --  2.0 2.0 2.1 2.3  PHOS  --  4.3 3.9  --   --    GFR: Estimated Creatinine Clearance: 71.7 mL/min (A) (by C-G formula based on SCr of 1.01 mg/dL (H)). Liver Function Tests: Recent Labs  Lab 10/08/23 0530  AST 22  ALT 30  ALKPHOS 44  BILITOT 1.3*  PROT 6.5  ALBUMIN 3.4*   No results for input(s): "LIPASE", "AMYLASE" in the last 168 hours. No results for input(s): "AMMONIA" in the last 168 hours. Coagulation Profile: No results for input(s): "INR", "PROTIME" in the last 168 hours. Cardiac Enzymes: No results for input(s): "CKTOTAL", "CKMB", "CKMBINDEX", "TROPONINI" in the last 168 hours. BNP (last 3 results) No results for input(s): "PROBNP" in the last 8760 hours. HbA1C: No results for input(s): "HGBA1C" in the last 72 hours. CBG: Recent Labs  Lab 10/10/23 0550 10/10/23 1227 10/10/23 1525 10/10/23 2141 10/11/23 0623  GLUCAP 98 156* 111* 137* 107*   Lipid Profile: No results for input(s): "CHOL", "HDL", "LDLCALC", "TRIG", "CHOLHDL", "LDLDIRECT" in the last 72 hours. Thyroid Function Tests: No results for input(s): "TSH", "T4TOTAL", "FREET4", "T3FREE",  "THYROIDAB" in the last 72 hours. Anemia Panel: No results for input(s): "VITAMINB12", "FOLATE", "FERRITIN", "TIBC", "IRON", "RETICCTPCT" in the last 72 hours. Sepsis Labs: Recent Labs  Lab 10/07/23 1457 10/08/23 0530  PROCALCITON  --  <0.10  LATICACIDVEN 0.7  --     Recent Results (from the past 240 hours)  Respiratory (~20 pathogens) panel by PCR     Status: None   Collection Time: 10/08/23  3:46 PM   Specimen: Nasopharyngeal Swab; Respiratory  Result Value Ref Range Status   Adenovirus NOT DETECTED NOT DETECTED Final   Coronavirus 229E NOT DETECTED NOT DETECTED Final    Comment: (NOTE) The Coronavirus on the Respiratory  Panel, DOES NOT test for the novel  Coronavirus (2019 nCoV)    Coronavirus HKU1 NOT DETECTED NOT DETECTED Final   Coronavirus NL63 NOT DETECTED NOT DETECTED Final   Coronavirus OC43 NOT DETECTED NOT DETECTED Final   Metapneumovirus NOT DETECTED NOT DETECTED Final   Rhinovirus / Enterovirus NOT DETECTED NOT DETECTED Final   Influenza A NOT DETECTED NOT DETECTED Final   Influenza B NOT DETECTED NOT DETECTED Final   Parainfluenza Virus 1 NOT DETECTED NOT DETECTED Final   Parainfluenza Virus 2 NOT DETECTED NOT DETECTED Final   Parainfluenza Virus 3 NOT DETECTED NOT DETECTED Final   Parainfluenza Virus 4 NOT DETECTED NOT DETECTED Final   Respiratory Syncytial Virus NOT DETECTED NOT DETECTED Final   Bordetella pertussis NOT DETECTED NOT DETECTED Final   Bordetella Parapertussis NOT DETECTED NOT DETECTED Final   Chlamydophila pneumoniae NOT DETECTED NOT DETECTED Final   Mycoplasma pneumoniae NOT DETECTED NOT DETECTED Final    Comment: Performed at [facility], [address] [place], [place] [address]         Radiology Studies: No results found.         LOS: 4 days   Time spent= 35 mins    Miguel Rota, MD [facility]  If 7PM-7AM, please contact night-coverage  10/11/2023, 11:57 AM

## 2023-10-11 NOTE — Progress Notes (Signed)
 PT Cancellation Note  Patient Details Name: Lynn Estrada MRN: [id number] DOB: Jan 17, 1961   Cancelled Treatment:    Reason Eval/Treat Not Completed: Patient at procedure or test/unavailable (Pt off floor for R heart cath. Will follow-up as schedule permits.)  Cheri Guppy, PT, DPT Acute Rehabilitation Services Office: [phone] Secure Chat Preferred  Lynn Estrada 10/11/2023, 7:45 AM

## 2023-10-11 NOTE — TOC Progression Note (Signed)
 Transition of Care [facility]) - Progression Note    Patient Details  Name: Lynn Estrada MRN: [id number] Date of Birth: 06-19-1961  Transition of [facility]) CM/SW Contact  Nicanor Bake Phone Number: [phone] 10/11/2023, 1:13 PM  Clinical Narrative:   HF CSW met with patient at bedside. Patient appeared as if she had just woke up. CSW introduced self and explained role. Patient stated that she would like for someone to come back and ask questions later because she was just waking up and "getting her barring's together." CSW stated that she will come back at a more appropriate time.   TOC will continue following.     Expected Discharge Plan: Home/Self Care Barriers to Discharge: Continued Medical Work up  Expected Discharge Plan and Services   Discharge Planning Services: CM Consult Post Acute Care Choice: NA Living arrangements for the past 2 months: Single Family Home                 DME Arranged: N/A DME Agency: NA                   Social Determinants of Health (SDOH) Interventions SDOH Screenings   Food Insecurity: No Food Insecurity (10/08/2023)  Housing: Low Risk  (10/08/2023)  Transportation Needs: No Transportation Needs (10/08/2023)  Utilities: Not At Risk (10/08/2023)  Depression (PHQ2-9): Low Risk  (03/16/2022)  Financial Resource Strain: Low Risk  (09/24/2023)   Received from [facility]  Physical Activity: Unknown (09/24/2023)   Received from [facility]  Social Connections: Patient Declined (09/30/2023)  Stress: No Stress Concern Present (09/24/2023)   Received from [facility]  Tobacco Use: Medium Risk (10/08/2023)    Readmission Risk Interventions    10/08/2023    4:15 PM  Readmission Risk Prevention Plan  Transportation Screening Complete  PCP or Specialist Appt within 3-5 Days Complete  HRI or Home Care Consult Complete  Palliative Care Screening Not Applicable  Medication Review (RN Care Manager) Complete

## 2023-10-11 NOTE — Interval H&P Note (Signed)
 History and Physical Interval Note:  10/11/2023 8:27 AM  Lynn Estrada  has presented today for surgery, with the diagnosis of heart failure.  The various methods of treatment have been discussed with the patient and family. After consideration of risks, benefits and other options for treatment, the patient has consented to  Procedure(s): RIGHT HEART CATH (N/A) as a surgical intervention.  The patient's history has been reviewed, patient examined, no change in status, stable for surgery.  I have reviewed the patient's chart and labs.  Questions were answered to the patient's satisfaction.     Tobey Lippard

## 2023-10-11 NOTE — Progress Notes (Addendum)
 Advanced Heart Failure Rounding Note  Cardiologist: Dr. Gala Romney  Chief Complaint: SOB Subjective:    1.5L in UOP yesterday. Overall, net negative 7L this admit. Wt down 10 lb.  SCr stable at 1.04, BUN bumped to 25.   Feels ok this morning. No current resting dyspnea. Denies CP.   RHC today w/ elevated PCW 25, RA 8 and low but not markedly low CI at 2.1.    Objective:   Weight Range: 82 kg Body mass index is 23.21 kg/m.   Vital Signs:   Temp:  [97.6 F (36.4 C)-98.3 F (36.8 C)] 98.2 F (36.8 C) (04/11 0420) Pulse Rate:  [75-95] 75 (04/11 0420) Resp:  [17-20] 20 (04/11 0420) BP: (88-117)/(46-90) 102/72 (04/11 0420) SpO2:  [94 %-99 %] 95 % (04/11 0420) Weight:  [82 kg] 82 kg (04/11 0420) Last BM Date : 10/10/23  Weight change: Filed Weights   10/09/23 0434 10/10/23 0447 10/11/23 0420  Weight: 83.7 kg 82.8 kg 82 kg    Intake/Output:   Intake/Output Summary (Last 24 hours) at 10/11/2023 0727 Last data filed at 10/11/2023 0514 Gross per 24 hour  Intake 604.26 ml  Output 1500 ml  Net -895.74 ml      Physical Exam    General:  fatigued appearing. No respiratory difficulty HEENT: normal Neck: supple. No lymphadenopathy or thyromegaly appreciated. Cor: PMI nondisplaced. Regular rate & rhythm. No rubs, gallops or murmurs. Lungs: clear Abdomen: soft, nontender, nondistended. No hepatosplenomegaly. No bruits or masses. Good bowel sounds. Extremities: no cyanosis, clubbing, rash, edema Neuro: alert & oriented x 3, cranial nerves grossly intact. moves all 4 extremities w/o difficulty. Affect pleasant.  Telemetry   A-V paced 70s   EKG    No new EKG to review   Labs    CBC No results for input(s): "WBC", "NEUTROABS", "HGB", "HCT", "MCV", "PLT" in the last 72 hours.  Basic Metabolic Panel Recent Labs    86/57/84 0224 10/10/23 0222 10/11/23 0301  NA 138 138 140  K 3.6 3.8 4.4  CL 102 103 107  CO2 24 23 24   GLUCOSE 106* 98 107*  BUN 18 20 25*   CREATININE 1.03* 1.04* 1.01*  CALCIUM 8.6* 8.9 9.0  MG 2.0 2.1 2.3  PHOS 3.9  --   --    Liver Function Tests No results for input(s): "AST", "ALT", "ALKPHOS", "BILITOT", "PROT", "ALBUMIN" in the last 72 hours.  No results for input(s): "LIPASE", "AMYLASE" in the last 72 hours. Cardiac Enzymes No results for input(s): "CKTOTAL", "CKMB", "CKMBINDEX", "TROPONINI" in the last 72 hours.  BNP: BNP (last 3 results) Recent Labs    10/02/23 1123 10/07/23 1442 10/10/23 0222  BNP 473.8* 916.5* 373.5*    ProBNP (last 3 results) No results for input(s): "PROBNP" in the last 8760 hours.   D-Dimer No results for input(s): "DDIMER" in the last 72 hours. Hemoglobin A1C No results for input(s): "HGBA1C" in the last 72 hours. Fasting Lipid Panel No results for input(s): "CHOL", "HDL", "LDLCALC", "TRIG", "CHOLHDL", "LDLDIRECT" in the last 72 hours. Thyroid Function Tests No results for input(s): "TSH", "T4TOTAL", "T3FREE", "THYROIDAB" in the last 72 hours.  Invalid input(s): "FREET3"  Other results:   Imaging    No results found.   Medications:     Scheduled Medications:  apixaban  5 mg Oral BID   atorvastatin  10 mg Oral q AM   bisoprolol  2.5 mg Oral Daily   digoxin  125 mcg Oral Daily   doxycycline  100 mg Oral Q12H   insulin aspart  0-9 Units Subcutaneous TID WC   midodrine  5 mg Oral TID WC   potassium chloride  40 mEq Oral BID   spironolactone  25 mg Oral Daily    Infusions:  sodium chloride 10 mL/hr at 10/11/23 0514    PRN Medications: acetaminophen **OR** acetaminophen, albuterol, benzonatate, melatonin, ondansetron (ZOFRAN) IV, senna-docusate, traZODone    [name] w/ h/o nephrolithiasis s/p partial right nephrectomy, CVA, recently diagnosed PE and chronic systolic heart failure, LBBB s/p CRT-D non responder w/ most recent EF chronically low at 20-25%, multiple admissions for CHF in the last 3 months, readmitted w/ a/c CHF w/ NYHA  Class IIIb symptoms.   Assessment/Plan   1. Acute on Chronic Combined Systolic/Diastolic Heart Failure, NICM - 07/13/21 Echo 20-25%, RV normal, grade II DD , and septal -lateral dyssnchrony - 07/2021 cMRI possible myocarditis  LVEF 18% Suggestive of LBBB, RVEF 33% - [facility] 07/2021 min CAD. RA 6 PCWP 21, CO 6.8/ CI 3.1 - Echo(4/23): EF 30-35% -> EF improving but still with significant LV dysfunction (suspect resolving myocarditis) - Echo 10/23 EF < 20% RV ok  -RHC 11/23 w/ Mildly elevated filling pressures with mildly decreased CO - CPX 12/23 - submax test. Non diagnostic.  - Echo 12/06/22 EF 20-25%  RV normal.  - Echo 01/14/23 EF < 20% RV ok  - LBBB, QRS  176 ms. -> now s/p Abbott CRT-D 7/24 -> QRS  - Echo 11/24 EF 20-25%  - Echo 2/25 EF 20-25%, RV nl  - Admitted w/ NYHA Class IIIb-IV symptoms w/ volume overload  - Multiple admits for a/c CHF in last several months + down-titration of GDMT due to hypotension and unintentional wt loss/ signs of cardiac cachectica. Concern she is nearing end-stage HF. Needs w/u for advanced therapies. Think, currently she is too deconditioned for transplant. Will need to work w/ PT. May be good candidate for LVAD. RV normal. SCr ok. But will need assessment of pulmonary function.  - Symptoms improving w/ diuresis, net negative 7L thus far. But remains volume up, PCWP on RHC today elevated at 25, RA 8, CI 2.1  - Continue diuresis, increase IV Lasix to 80 mg bid and give 500 mg of Diamox x 1 - continue spironolactone 25 mg daily  - continue bisoprolol 2.5 mg daily  - continue digoxin 0.125 mg daily  - off Entresto due to hypotension - BP too soft for other GDMT. Continue midodrine 5 mg tid - will need eventual PFTs, after diuresis, to see if she would be a VAD candidate    2. ? PNA On empiric abx per IM, doxycyline, based on CXR/CT, though suspect findings most likely 2/2 CHF. Respiratory panel negative and PCT also normal, < 0.10  - per IM, will continue  Doxy x 5 day course    3. PE - diagnosed previous admit 3/25, small burden RLL. LE venous dopplers + for SVT. No DVT   - on Eliquis. Repeat CT of chest this admit shows interval improvement of PE burden, diminished from initial scan. No new or progressive pulmonary emboli.  - continue Eliquis 5 mg bid    4. Fragility/Decondition - needs to ambulate more and will need to get stronger to be considered for transplant/VAD - PT/OT consulted    Length of Stay: [address] Delmer Islam  10/11/2023, 7:27 AM  Advanced Heart Failure Team Pager [phone] (M-F; 7a - 5p)  Please contact [facility] for night-coverage after hours (5p -7a ) and weekends on [email or web address]

## 2023-10-11 NOTE — Plan of Care (Signed)

## 2023-10-12 DIAGNOSIS — J189 Pneumonia, unspecified organism: Secondary | ICD-10-CM | POA: Diagnosis not present

## 2023-10-12 DIAGNOSIS — I5043 Acute on chronic combined systolic (congestive) and diastolic (congestive) heart failure: Secondary | ICD-10-CM | POA: Diagnosis not present

## 2023-10-12 LAB — BASIC METABOLIC PANEL WITH GFR
Anion gap: 10 (ref 5–15)
BUN: 29 mg/dL — ABNORMAL HIGH (ref 8–23)
CO2: 23 mmol/L (ref 22–32)
Calcium: 9.4 mg/dL (ref 8.9–10.3)
Chloride: 104 mmol/L (ref 98–111)
Creatinine, Ser: 1.1 mg/dL — ABNORMAL HIGH (ref 0.44–1.00)
GFR, Estimated: 57 mL/min — ABNORMAL LOW (ref >60)
Glucose, Bld: 113 mg/dL — ABNORMAL HIGH (ref 70–99)
Potassium: 4.2 mmol/L (ref 3.5–5.1)
Sodium: 137 mmol/L (ref 135–145)

## 2023-10-12 LAB — GLUCOSE, CAPILLARY
Glucose-Capillary: 116 mg/dL — ABNORMAL HIGH (ref 70–99)
Glucose-Capillary: 118 mg/dL — ABNORMAL HIGH (ref 70–99)
Glucose-Capillary: 112 mg/dL — ABNORMAL HIGH (ref 70–99)
Glucose-Capillary: 155 mg/dL — ABNORMAL HIGH (ref 70–99)

## 2023-10-12 LAB — MAGNESIUM: Magnesium: 2.5 mg/dL — ABNORMAL HIGH (ref 1.7–2.4)

## 2023-10-12 MED ORDER — ORAL CARE MOUTH RINSE: 15.0000 mL | OROMUCOSAL | Status: DC | PRN

## 2023-10-12 NOTE — Progress Notes (Signed)
 Rounding Note    Patient Name: Lynn Estrada Date of Encounter: 10/12/2023   [facility]Care Cardiologist: Bensimhon   Subjective   63 year old female with a history of acute on chronic combined systolic and diastolic congestive heart failure.  Her LVEF remains around 20 to 25%.  She had a right heart catheterization yesterday.    RA pressure equals 8 RV pressures 49/6  PA pressure is 51/31 with a mean of 38 Pulmonary capillary wedge pressure is 34 Fick cardiac output is 6.3 L/min Fick cardiac index is 3 L/min per BSA Thermodilution cardiac output is 4.4 Thermo cardiac index is 2.12   She is continue to diurese.  She is diuresed a total of 9.7 L so far during this admission. Her weight today was 81.4 kg.   Inpatient Medications    Scheduled Meds:  apixaban  5 mg Oral BID   atorvastatin  10 mg Oral q AM   bisoprolol  2.5 mg Oral Daily   digoxin  125 mcg Oral Daily   doxycycline  100 mg Oral Q12H   feeding supplement  237 mL Oral BID BM   furosemide  80 mg Intravenous BID   insulin aspart  0-9 Units Subcutaneous TID WC   potassium chloride  40 mEq Oral BID   spironolactone  25 mg Oral Daily   Continuous Infusions:  PRN Meds: acetaminophen **OR** acetaminophen, albuterol, benzonatate, melatonin, ondansetron (ZOFRAN) IV, mouth rinse, senna-docusate, traZODone   Vital Signs    Vitals:   10/11/23 2331 10/12/23 0436 10/12/23 0826 10/12/23 0857  BP: 100/73 (!) 92/59  91/66  Pulse: 71 93 67 73  Resp: 18 17  18   Temp: 97.8 F (36.6 C) 97.8 F (36.6 C)  98.1 F (36.7 C)  TempSrc: Oral Oral  Oral  SpO2: 100% 97%    Weight:  81.4 kg    Height:        Intake/Output Summary (Last 24 hours) at 10/12/2023 1258 Last data filed at 10/12/2023 0837 Gross per 24 hour  Intake 760 ml  Output 3400 ml  Net -2640 ml      10/12/2023    4:36 AM 10/11/2023    4:20 AM 10/10/2023    4:47 AM  Last 3 Weights  Weight (lbs) 179 lb 7.3 oz 180 lb 12.4 oz 182 lb 9.6 oz   Weight (kg) 81.4 kg 82 kg 82.827 kg      Telemetry    NSR  - Personally Reviewed  ECG     - Personally Reviewed  Physical Exam   GEN: No acute distress.   Neck: No JVD Cardiac: RRR, no murmurs, rubs, or gallops.  Respiratory: Clear to auscultation bilaterally. GI: Soft, nontender, non-distended  MS: No edema; No deformity. Neuro:  Nonfocal  Psych: Normal affect   Labs    High Sensitivity Troponin:   Recent Labs  Lab 09/30/23 1229 09/30/23 1435  TROPONINIHS 28* 32*     Chemistry Recent Labs  Lab 10/08/23 0530 10/09/23 0224 10/10/23 0222 10/11/23 0301 10/11/23 0806 10/12/23 0404  NA 140   < > 138 140 142  142 137  K 3.6   < > 3.8 4.4 4.3  4.3 4.2  CL 103   < > 103 107  --  104  CO2 26   < > 23 24  --  23  GLUCOSE 100*   < > 98 107*  --  113*  BUN 15   < > 20 25*  --  29*  CREATININE 1.04*   < > 1.04* 1.01*  --  1.10*  CALCIUM 8.9   < > 8.9 9.0  --  9.4  MG 2.0   < > 2.1 2.3  --  2.5*  PROT 6.5  --   --   --   --   --   ALBUMIN 3.4*  --   --   --   --   --   AST 22  --   --   --   --   --   ALT 30  --   --   --   --   --   ALKPHOS 44  --   --   --   --   --   BILITOT 1.3*  --   --   --   --   --   GFRNONAA >60   < > >60 >60  --  57*  ANIONGAP 11   < > 12 9  --  10   < > = values in this interval not displayed.    Lipids No results for input(s): "CHOL", "TRIG", "HDL", "LABVLDL", "LDLCALC", "CHOLHDL" in the last 168 hours.  Hematology Recent Labs  Lab 10/07/23 1441 10/08/23 0530 10/11/23 0806  WBC 5.2 4.8  --   RBC 3.54* 3.38*  --   HGB 9.8* 9.3* 10.5*  10.5*  HCT 32.0* 30.4* 31.0*  31.0*  MCV 90.4 89.9  --   MCH 27.7 27.5  --   MCHC 30.6 30.6  --   RDW 15.4 15.6*  --   PLT 230 206  --    Thyroid No results for input(s): "TSH", "FREET4" in the last 168 hours.  BNP Recent Labs  Lab 10/07/23 1442 10/10/23 0222  BNP 916.5* 373.5*    DDimer No results for input(s): "DDIMER" in the last 168 hours.   Radiology    No results  found.  Cardiac Studies     Patient Profile     63 y.o. female   Assessment & Plan    Acute on chronic combined systolic and diastolic congestive heart failure: Right heart catheterization revealed that she still volume overloaded.  She has an elevated pulmonary capillary wedge pressure.   RA pressure equals 8 RV pressures 49/6  PA pressure is 51/31 with a mean of 38 Pulmonary capillary wedge pressure is 34 Fick cardiac output is 6.3 L/min Fick cardiac index is 3 L/min per BSA Thermodilution cardiac output is 4.4 Thermo cardiac index is 2.12  Continue diuresis.  Will continue to reassess       For questions or updates, please contact [facility] Please consult [email or web address] for contact info under        Signed, Ahmad Alert, MD  10/12/2023, 12:58 PM

## 2023-10-12 NOTE — Progress Notes (Signed)
 Progress Note   Patient: Lynn Estrada:[id number] DOB: February 27, 1961 DOA: 10/07/2023     5 DOS: the patient was seen and examined on 10/12/2023   Brief hospital course:  63 year old with history of acute PE, systolic CHF EF 25%., DM2, HLD, anemia of chronic disease, admitted to [facility] recently for pneumonia now being admitted again for shortness of breath with concerns of fluid overload recurrent HCAP.  During recent admission patient completed 5 days of cefepime and azithromycin.  CTA chest showed right lower lobe pneumonia, pulmonary edema and effusion.  Eventually thought to be in fluid overload therefore started on diuresis, cardiology team consulted.  RHC done 4/11 showing volume overload, continue diuresis through the weekend.  Assessment & Plan:  Principal Problem:   HCAP (healthcare-associated pneumonia) Active Problems:   HLD (hyperlipidemia)   Acute on chronic combined systolic and diastolic CHF (congestive heart failure) (HCC)   SOB (shortness of breath)   History of pulmonary embolism   DM2 (diabetes mellitus, type 2) (HCC)   Anemia of chronic disease   Assessment and Plan:  Acute on chronic congestive heart failure with reduced EF, 25% Status post BiV PPM History of nonobstructive CAD - Elevated BNP.  Receiving Lasix IV.  Responding well to this.  Echocardiogram done in February 2025.  CHF team following.   - RHC 4/11 shows elevated pressures.   - Will continue diuresis through the weekend - GDMT per cardiology    Acute respiratory distress Healthcare acquired pneumonia - CTA chest-reviewed.  Elevated BNP.  Procalcitonin is negative.  Very low suspicion for pneumonia.    - Respiratory panel is negative - Bronchodilators, I-S/flutter valve.   - Will complete 5-day course of doxycycline.   History of pulmonary embolism - On Eliquis  Diabetes mellitus type 2 - Accu-Cheks and sliding scale  Hyperlipidemia - Statin  Anemia of chronic disease -  Baseline hemoglobin  History of CVA - November 2024.  Now on Eliquis. Plavix discontinued since previous admission      Subjective: Patient states she is breathing better.  She underwent RHC on 4/11.  Physical Exam: Vitals:   10/12/23 0436 10/12/23 0826 10/12/23 0857 10/12/23 1436  BP: (!) 92/59  91/66 (!) 91/59  Pulse: 93 67 73 63  Resp: 17  18 16   Temp: 97.8 F (36.6 C)  98.1 F (36.7 C)   TempSrc: Oral  Oral   SpO2: 97%   96%  Weight: 81.4 kg     Height:         General: Alert, oriented X3  Eyes: Pupils equal, reactive  Oral cavity: moist mucous membranes  Head: Atraumatic, normocephalic  Neck: supple  Chest: Diminished breath sounds CVS: S1,S2 RRR. No murmurs  Abd: No distention, soft, non-tender. No masses palpable  Extr: No edema   MSK: No joint deformities or swelling  Neurological: Grossly intact.    Data Reviewed:     Latest Ref Rng & Units 10/11/2023    8:06 AM 10/08/2023    5:30 AM 10/07/2023    2:41 PM  CBC  WBC 4.0 - 10.5 K/uL  4.8  5.2   Hemoglobin 12.0 - 15.0 g/dL 24.4 - 01.0 g/dL 27.2    53.6  9.3  9.8   Hematocrit 36.0 - 46.0 % 36.0 - 46.0 % 31.0    31.0  30.4  32.0   Platelets 150 - 400 K/uL  206  230       Latest Ref Rng & Units 10/12/2023  4:04 AM 10/11/2023    8:06 AM 10/11/2023    3:01 AM  BMP  Glucose 70 - 99 mg/dL 696   295   BUN 8 - 23 mg/dL 29   25   Creatinine 2.84 - 1.00 mg/dL 1.32   4.40   Sodium 102 - 145 mmol/L 137  142    142  140   Potassium 3.5 - 5.1 mmol/L 4.2  4.3    4.3  4.4   Chloride 98 - 111 mmol/L 104   107   CO2 22 - 32 mmol/L 23   24   Calcium 8.9 - 10.3 mg/dL 9.4   9.0     Family Communication: Spoke with family at bedside  Disposition: Status is: Inpatient Remains inpatient appropriate because: She continues to require IV diuresis  Planned Discharge Destination: Home  DVT PPx: On systemic anticoagulation with Eliquis.    Time spent: 40 minutes  Author: MDALA-GAUSI, Camiya Vinal AGATHA,  MD 10/12/2023 7:15 PM  For on call review [email or web address].

## 2023-10-13 DIAGNOSIS — J189 Pneumonia, unspecified organism: Secondary | ICD-10-CM | POA: Diagnosis not present

## 2023-10-13 DIAGNOSIS — I5043 Acute on chronic combined systolic (congestive) and diastolic (congestive) heart failure: Secondary | ICD-10-CM | POA: Diagnosis not present

## 2023-10-13 LAB — GLUCOSE, CAPILLARY
Glucose-Capillary: 113 mg/dL — ABNORMAL HIGH (ref 70–99)
Glucose-Capillary: 128 mg/dL — ABNORMAL HIGH (ref 70–99)
Glucose-Capillary: 118 mg/dL — ABNORMAL HIGH (ref 70–99)
Glucose-Capillary: 153 mg/dL — ABNORMAL HIGH (ref 70–99)

## 2023-10-13 LAB — BASIC METABOLIC PANEL WITH GFR
Anion gap: 10 (ref 5–15)
BUN: 42 mg/dL — ABNORMAL HIGH (ref 8–23)
CO2: 22 mmol/L (ref 22–32)
Calcium: 9.9 mg/dL (ref 8.9–10.3)
Chloride: 104 mmol/L (ref 98–111)
Creatinine, Ser: 1.16 mg/dL — ABNORMAL HIGH (ref 0.44–1.00)
GFR, Estimated: 54 mL/min — ABNORMAL LOW (ref >60)
Glucose, Bld: 123 mg/dL — ABNORMAL HIGH (ref 70–99)
Potassium: 4.6 mmol/L (ref 3.5–5.1)
Sodium: 136 mmol/L (ref 135–145)

## 2023-10-13 LAB — CBC
HCT: 34.2 % — ABNORMAL LOW (ref 36.0–46.0)
Hemoglobin: 11.1 g/dL — ABNORMAL LOW (ref 12.0–15.0)
MCH: 28.3 pg (ref 26.0–34.0)
MCHC: 32.5 g/dL (ref 30.0–36.0)
MCV: 87.2 fL (ref 80.0–100.0)
Platelets: 247 10*3/uL (ref 150–400)
RBC: 3.92 MIL/uL (ref 3.87–5.11)
RDW: 15.4 % (ref 11.5–15.5)
WBC: 4.7 10*3/uL (ref 4.0–10.5)
nRBC: 0 % (ref 0.0–0.2)

## 2023-10-13 LAB — MAGNESIUM: Magnesium: 2.6 mg/dL — ABNORMAL HIGH (ref 1.7–2.4)

## 2023-10-13 MED ORDER — FUROSEMIDE 20 MG PO TABS: 20.0000 mg | ORAL_TABLET | Freq: Every day | ORAL | Status: DC | PRN

## 2023-10-13 MED ORDER — MIDODRINE HCL 5 MG PO TABS
5.0000 mg | ORAL_TABLET | Freq: Three times a day (TID) | ORAL | Status: DC
  Administered 2023-10-13 – 2023-10-15 (×6): 5 mg via ORAL
  Filled 2023-10-13 (×6): qty 1

## 2023-10-13 NOTE — Progress Notes (Signed)
 Rounding Note    Patient Name: Lynn Estrada Date of Encounter: 10/13/2023   HeartCare Cardiologist: Bensimhon   Subjective   63 year old female with a history of acute on chronic combined systolic and diastolic congestive heart failure.  Her LVEF remains around 20 to 25%.  She had a right heart catheterization last week      RA pressure equals 8 RV pressures 49/6  PA pressure is 51/31 with a mean of 38 Pulmonary capillary wedge pressure is 34 Fick cardiac output is 6.3 L/min Fick cardiac index is 3 L/min per BSA Thermodilution cardiac output is 4.4 Thermo cardiac index is 2.12   She has continued to diurese.  She is diuresed a total of 11.3 liters so far. Wt is 81.2 kg   Her RA was 8 at [facility] on Friday.  She appears to be euvolemic      Inpatient Medications    Scheduled Meds:  apixaban  5 mg Oral BID   atorvastatin  10 mg Oral q AM   bisoprolol  2.5 mg Oral Daily   digoxin  125 mcg Oral Daily   doxycycline  100 mg Oral Q12H   feeding supplement  237 mL Oral BID BM   furosemide  80 mg Intravenous BID   insulin aspart  0-9 Units Subcutaneous TID WC   potassium chloride  40 mEq Oral BID   spironolactone  25 mg Oral Daily   Continuous Infusions:  PRN Meds: acetaminophen **OR** acetaminophen, albuterol, benzonatate, melatonin, ondansetron (ZOFRAN) IV, mouth rinse, senna-docusate, traZODone   Vital Signs    Vitals:   10/12/23 1939 10/13/23 0057 10/13/23 0601 10/13/23 0916  BP: 96/71 95/68 107/71 97/68  Pulse: 62 69 71 63  Resp: 16 16 16 17   Temp: 97.6 F (36.4 C) 97.6 F (36.4 C) (!) 97.5 F (36.4 C) 97.6 F (36.4 C)  TempSrc: Oral Oral Oral Oral  SpO2: 98% 94% 99% 95%  Weight:   81.2 kg   Height:        Intake/Output Summary (Last 24 hours) at 10/13/2023 0943 Last data filed at 10/13/2023 0924 Gross per 24 hour  Intake 440 ml  Output 2000 ml  Net -1560 ml      10/13/2023    6:01 AM 10/12/2023    4:36 AM 10/11/2023    4:20 AM   Last 3 Weights  Weight (lbs) 179 lb 179 lb 7.3 oz 180 lb 12.4 oz  Weight (kg) 81.194 kg 81.4 kg 82 kg      Telemetry    NSR  - Personally Reviewed  ECG     - Personally Reviewed  Physical Exam   GEN: No acute distress.   Neck: No JVD Cardiac: RRR, no murmurs, rubs, or gallops.  Respiratory: Clear to auscultation bilaterally. GI: Soft, nontender, non-distended  MS: No edema; No deformity. Neuro:  Nonfocal  Psych: Normal affect   Labs    High Sensitivity Troponin:   Recent Labs  Lab 09/30/23 1229 09/30/23 1435  TROPONINIHS 28* 32*     Chemistry Recent Labs  Lab 10/08/23 0530 10/09/23 0224 10/11/23 0301 10/11/23 0806 10/12/23 0404 10/13/23 0243  NA 140   < > 140 142  142 137 136  K 3.6   < > 4.4 4.3  4.3 4.2 4.6  CL 103   < > 107  --  104 104  CO2 26   < > 24  --  23 22  GLUCOSE 100*   < > 107*  --  113* 123*  BUN 15   < > 25*  --  29* 42*  CREATININE 1.04*   < > 1.01*  --  1.10* 1.16*  CALCIUM 8.9   < > 9.0  --  9.4 9.9  MG 2.0   < > 2.3  --  2.5* 2.6*  PROT 6.5  --   --   --   --   --   ALBUMIN 3.4*  --   --   --   --   --   AST 22  --   --   --   --   --   ALT 30  --   --   --   --   --   ALKPHOS 44  --   --   --   --   --   BILITOT 1.3*  --   --   --   --   --   GFRNONAA >60   < > >60  --  57* 54*  ANIONGAP 11   < > 9  --  10 10   < > = values in this interval not displayed.    Lipids No results for input(s): "CHOL", "TRIG", "HDL", "LABVLDL", "LDLCALC", "CHOLHDL" in the last 168 hours.  Hematology Recent Labs  Lab 10/07/23 1441 10/08/23 0530 10/11/23 0806 10/13/23 0243  WBC 5.2 4.8  --  4.7  RBC 3.54* 3.38*  --  3.92  HGB 9.8* 9.3* 10.5*  10.5* 11.1*  HCT 32.0* 30.4* 31.0*  31.0* 34.2*  MCV 90.4 89.9  --  87.2  MCH 27.7 27.5  --  28.3  MCHC 30.6 30.6  --  32.5  RDW 15.4 15.6*  --  15.4  PLT 230 206  --  247   Thyroid No results for input(s): "TSH", "FREET4" in the last 168 hours.  BNP Recent Labs  Lab 10/07/23 1442  10/10/23 0222  BNP 916.5* 373.5*    DDimer No results for input(s): "DDIMER" in the last 168 hours.   Radiology    No results found.  Cardiac Studies     Patient Profile     63 y.o. female   Assessment & Plan    Acute on chronic combined systolic and diastolic congestive heart failure: Right heart catheterization revealed that she still volume overloaded.  She has an elevated pulmonary capillary wedge pressure.   RA pressure equals 8 RV pressures 49/6  PA pressure is 51/31 with a mean of 38 Pulmonary capillary wedge pressure is 34 Fick cardiac output is 6.3 L/min Fick cardiac index is 3 L/min per BSA Thermodilution cardiac output is 4.4 Thermo cardiac index is 2.12   She appears to be euvolemic today.  Will change her IV Lasix back to her home dose of Lasix.  She was previously on Lasix 20 mg a day as needed.  She was also on on midodrine 5 mg 3 times a day with meals.  Will restart  her home lasix and midodrine  Dr. Julane Ny will see her tomorrow        For questions or updates, please contact [facility] Please consult [email or web address] for contact info under        Signed, Ahmad Alert, MD  10/13/2023, 9:43 AM

## 2023-10-13 NOTE — Plan of Care (Signed)

## 2023-10-13 NOTE — Progress Notes (Signed)
 Progress Note   Patient: Lynn Estrada WJX:[id number] DOB: 1960-11-19 DOA: 10/07/2023     6 DOS: the patient was seen and examined on 10/13/2023   Brief hospital course:  63 year old with history of acute PE, systolic CHF EF 25%., DM2, HLD, anemia of chronic disease, admitted to [facility] recently for pneumonia now being admitted again for shortness of breath with concerns of fluid overload recurrent HCAP.  During recent admission patient completed 5 days of cefepime and azithromycin.  CTA chest showed right lower lobe pneumonia, pulmonary edema and effusion.  Eventually thought to be in fluid overload therefore started on diuresis, cardiology team consulted.  RHC done 4/11 showing volume overload, continue diuresis through the weekend.  Assessment & Plan:  Principal Problem:   HCAP (healthcare-associated pneumonia) Active Problems:   HLD (hyperlipidemia)   Acute on chronic combined systolic and diastolic CHF (congestive heart failure) (HCC)   SOB (shortness of breath)   History of pulmonary embolism   DM2 (diabetes mellitus, type 2) (HCC)   Anemia of chronic disease   Assessment and Plan:  Acute on chronic congestive heart failure with reduced EF, 25% Status post BiV PPM History of nonobstructive CAD - Elevated BNP.  Receiving Lasix IV.  Responding well to this.  Echocardiogram done in February 2025.  CHF team following.   - RHC 4/11 shows elevated pressures.   - Will continue diuresis through the weekend - GDMT per cardiology    Acute respiratory distress Healthcare acquired pneumonia - CTA chest-reviewed.  Elevated BNP.  Procalcitonin is negative.  Very low suspicion for pneumonia.    - Respiratory panel is negative - Bronchodilators, I-S/flutter valve.   - Will complete 5-day course of doxycycline.   History of pulmonary embolism - On Eliquis  Diabetes mellitus type 2 - Accu-Cheks and sliding scale  Hyperlipidemia - Statin  Anemia of chronic disease -  Baseline hemoglobin  History of CVA - November 2024.  Now on Eliquis. Plavix discontinued since previous admission      Subjective: Patient has no new complaints. Overnight staff reported patient has been drinking too much fluid. Discussed with patient this morning. Fluid restriction added to diet order.   Physical Exam: Vitals:   10/12/23 1939 10/13/23 0057 10/13/23 0601 10/13/23 0916  BP: 96/71 95/68 107/71 97/68  Pulse: 62 69 71 63  Resp: 16 16 16 17   Temp: 97.6 F (36.4 C) 97.6 F (36.4 C) (!) 97.5 F (36.4 C) 97.6 F (36.4 C)  TempSrc: Oral Oral Oral Oral  SpO2: 98% 94% 99% 95%  Weight:   81.2 kg   Height:         General: Alert, oriented X3  Eyes: Pupils equal, reactive  Oral cavity: moist mucous membranes  Head: Atraumatic, normocephalic  Neck: supple  Chest: Clear to auscultation CVS: S1,S2 RRR. No murmurs  Abd: No distention, soft, non-tender. No masses palpable  Extr: No edema   MSK: No joint deformities or swelling  Neurological: Grossly intact.    Data Reviewed:     Latest Ref Rng & Units 10/13/2023    2:43 AM 10/11/2023    8:06 AM 10/08/2023    5:30 AM  CBC  WBC 4.0 - 10.5 K/uL 4.7   4.8   Hemoglobin 12.0 - 15.0 g/dL 21.3  08.6    57.8  9.3   Hematocrit 36.0 - 46.0 % 34.2  31.0    31.0  30.4   Platelets 150 - 400 K/uL 247   206  Latest Ref Rng & Units 10/13/2023    2:43 AM 10/12/2023    4:04 AM 10/11/2023    8:06 AM  BMP  Glucose 70 - 99 mg/dL 161  096    BUN 8 - 23 mg/dL 42  29    Creatinine 0.45 - 1.00 mg/dL 4.09  8.11    Sodium 914 - 145 mmol/L 136  137  142    142   Potassium 3.5 - 5.1 mmol/L 4.6  4.2  4.3    4.3   Chloride 98 - 111 mmol/L 104  104    CO2 22 - 32 mmol/L 22  23    Calcium 8.9 - 10.3 mg/dL 9.9  9.4      Family Communication: Spoke with family at bedside  Disposition: Status is: Inpatient Remains inpatient appropriate because: She continues to require IV diuresis  Planned Discharge Destination: Home  DVT PPx:  On systemic anticoagulation with Eliquis.    Time spent: 40 minutes  Author: MDALA-GAUSI, Kathryne Ramella AGATHA, MD 10/13/2023 10:10 AM  For on call review [email or web address].

## 2023-10-13 NOTE — Plan of Care (Signed)
   Problem: Education: Goal: Ability to describe self-care measures that may prevent or decrease complications (Diabetes Survival Skills Education) will improve Outcome: Progressing Goal: Individualized Educational Video(s) Outcome: Progressing

## 2023-10-14 ENCOUNTER — Encounter (HOSPITAL_COMMUNITY): Payer: Self-pay | Admitting: Cardiology

## 2023-10-14 ENCOUNTER — Ambulatory Visit: Admitting: Physical Therapy

## 2023-10-14 DIAGNOSIS — J189 Pneumonia, unspecified organism: Secondary | ICD-10-CM | POA: Diagnosis not present

## 2023-10-14 DIAGNOSIS — I5043 Acute on chronic combined systolic (congestive) and diastolic (congestive) heart failure: Secondary | ICD-10-CM | POA: Diagnosis not present

## 2023-10-14 LAB — BASIC METABOLIC PANEL WITH GFR
Anion gap: 11 (ref 5–15)
BUN: 44 mg/dL — ABNORMAL HIGH (ref 8–23)
CO2: 22 mmol/L (ref 22–32)
Calcium: 9.6 mg/dL (ref 8.9–10.3)
Chloride: 104 mmol/L (ref 98–111)
Creatinine, Ser: 1.16 mg/dL — ABNORMAL HIGH (ref 0.44–1.00)
GFR, Estimated: 54 mL/min — ABNORMAL LOW (ref >60)
Glucose, Bld: 106 mg/dL — ABNORMAL HIGH (ref 70–99)
Potassium: 4.3 mmol/L (ref 3.5–5.1)
Sodium: 137 mmol/L (ref 135–145)

## 2023-10-14 LAB — GLUCOSE, CAPILLARY
Glucose-Capillary: 94 mg/dL (ref 70–99)
Glucose-Capillary: 124 mg/dL — ABNORMAL HIGH (ref 70–99)
Glucose-Capillary: 112 mg/dL — ABNORMAL HIGH (ref 70–99)
Glucose-Capillary: 123 mg/dL — ABNORMAL HIGH (ref 70–99)

## 2023-10-14 LAB — MAGNESIUM: Magnesium: 2.4 mg/dL (ref 1.7–2.4)

## 2023-10-14 MED ORDER — FUROSEMIDE 40 MG PO TABS
40.0000 mg | ORAL_TABLET | Freq: Every day | ORAL | Status: DC
  Administered 2023-10-14: 40 mg via ORAL
  Filled 2023-10-14: qty 1

## 2023-10-14 NOTE — Progress Notes (Signed)
 Progress Note   Patient: Lynn Estrada:[id number] DOB: [birth year] DOA: 10/07/2023     7 DOS: the patient was seen and examined on 10/14/2023   Brief hospital course:  63 year old with history of acute PE, systolic CHF EF 25%., DM2, HLD, anemia of chronic disease, admitted to [facility] recently for pneumonia now being admitted again for shortness of breath with concerns of fluid overload recurrent HCAP.  During recent admission patient completed 5 days of cefepime and azithromycin.  CTA chest showed right lower lobe pneumonia, pulmonary edema and effusion.  Eventually thought to be in fluid overload therefore started on diuresis, cardiology team consulted.  RHC done 4/11 showing volume overload, continue diuresis through the weekend.  Assessment & Plan:  Principal Problem:   HCAP (healthcare-associated pneumonia) Active Problems:   HLD (hyperlipidemia)   Acute on chronic combined systolic and diastolic CHF (congestive heart failure) (HCC)   SOB (shortness of breath)   History of pulmonary embolism   DM2 (diabetes mellitus, type 2) (HCC)   Anemia of chronic disease   Assessment and Plan:  Acute on chronic congestive heart failure with reduced EF, 25% Status post BiV PPM History of nonobstructive CAD - Elevated BNP.  Receiving Lasix.  Responding well to this.  Echocardiogram done in February 2025.  CHF team following.   - RHC 4/11 shows elevated pressures.   - Will continue diuresis.  - GDMT per cardiology -Heart failure team evaluating for possible LVAD. - PFTs pending.    Acute respiratory distress Healthcare acquired pneumonia - CTA chest-reviewed.  Elevated BNP.  Procalcitonin is negative.  Very low suspicion for pneumonia.    - Respiratory panel is negative - Bronchodilators, I-S/flutter valve.   -Patient has completed a 5-day course of doxycycline.  History of pulmonary embolism - On Eliquis  Diabetes mellitus type 2 - Accu-Cheks and sliding  scale  Hyperlipidemia - Statin  Anemia of chronic disease - Baseline hemoglobin  History of CVA - November 2024.  Now on Eliquis. Plavix discontinued since previous admission      Subjective: Patient is feeling well.  Physical Exam: Vitals:   10/14/23 0442 10/14/23 0727 10/14/23 0923 10/14/23 1123  BP: 98/64 98/66  (!) 93/56  Pulse: 60 60 73 73  Resp: 17 18  20   Temp: 98.6 F (37 C) 98.6 F (37 C)  97.9 F (36.6 C)  TempSrc: Axillary Axillary  Oral  SpO2: 97% 96%  99%  Weight: 81.8 kg     Height:         General: Alert, oriented X3  Eyes: Pupils equal, reactive  Oral cavity: moist mucous membranes  Head: Atraumatic, normocephalic  Neck: supple  Chest: Clear to auscultation CVS: S1,S2 RRR. No murmurs  Abd: No distention, soft, non-tender. No masses palpable  Extr: No edema   MSK: No joint deformities or swelling  Neurological: Grossly intact.    Data Reviewed:     Latest Ref Rng & Units 10/13/2023    2:43 AM 10/11/2023    8:06 AM 10/08/2023    5:30 AM  CBC  WBC 4.0 - 10.5 K/uL 4.7   4.8   Hemoglobin 12.0 - 15.0 g/dL 81.1  91.4    78.2  9.3   Hematocrit 36.0 - 46.0 % 34.2  31.0    31.0  30.4   Platelets 150 - 400 K/uL 247   206       Latest Ref Rng & Units 10/14/2023    3:38 AM 10/13/2023  2:43 AM 10/12/2023    4:04 AM  BMP  Glucose 70 - 99 mg/dL 161  096  045   BUN 8 - 23 mg/dL 44  42  29   Creatinine 0.44 - 1.00 mg/dL 4.09  8.11  9.14   Sodium 135 - 145 mmol/L 137  136  137   Potassium 3.5 - 5.1 mmol/L 4.3  4.6  4.2   Chloride 98 - 111 mmol/L 104  104  104   CO2 22 - 32 mmol/L 22  22  23    Calcium 8.9 - 10.3 mg/dL 9.6  9.9  9.4     Family Communication: Spoke with family at bedside  Disposition: Status is: Inpatient Remains inpatient appropriate because: She continues to require IV diuresis  Planned Discharge Destination: Home  DVT PPx: On systemic anticoagulation with Eliquis.    Time spent: 40 minutes  Author: MDALA-GAUSI, Britten Parady  AGATHA, MD 10/14/2023 2:05 PM  For on call review [email or web address].

## 2023-10-14 NOTE — Plan of Care (Signed)

## 2023-10-14 NOTE — Progress Notes (Signed)
 Mobility Specialist Progress Note:   10/14/23 1005  Mobility  Activity Ambulated with assistance in hallway  Level of Assistance Standby assist, set-up cues, supervision of patient - no hands on  Assistive Device Four wheel walker  Distance Ambulated (ft) 200 ft  Activity Response Tolerated well  Mobility Referral Yes  Mobility visit 1 Mobility  Mobility Specialist Start Time (ACUTE ONLY) 1005  Mobility Specialist Stop Time (ACUTE ONLY) 1015  Mobility Specialist Time Calculation (min) (ACUTE ONLY) 10 min   Pt agreeable to mobility session. Required no physical assistance to ambulate with rollator. No c/o throughout, pt back in bed with all needs met.   Lynn Estrada Mobility Specialist Please contact via SecureChat or  Rehab office at [phone]

## 2023-10-14 NOTE — Progress Notes (Signed)
 Occupational Therapy Treatment Patient Details Name: Lynn Estrada MRN: [id number] DOB: [birth year] Today's Date: 10/14/2023   History of present illness Lynn Estrada is a 63 y.o. female admitted 10/07/23 for suspected HCAP. Pt presented with SOB. CTA chest showed right lower lobe pneumonia, pulmonary edema and effusion. PMH of acute PE, systolic CHF EF 25%., DM2, HLD, HTN, GERD, nephrolithiasis, partial right nephrectomy, LBBB, and anemia of chronic disease. Of note recent hospitalization 3/31-4/2 for RLL PE.   OT comments  Pt received EOB and agreeable to therapy. Session focused on UE HEP and strengthening. Pt given red theraband to complete various UE exercises, also given yellow when fatigued and red became too challenging. Pt received HEP handout and demonstrated understanding. Pt ambulated short distance in hall with newly acquired rollator with supervision. D/c recommendations remain appropriate, Acute OT to continue to follow to address established goals to facilitate DC to next venue of care.        If plan is discharge home, recommend the following:  Assistance with cooking/housework;Assist for transportation   Equipment Recommendations  Other (comment) Aeronautical engineer)    Recommendations for Other Services      Precautions / Restrictions Precautions Precautions: Fall Recall of Precautions/Restrictions: Intact Restrictions Weight Bearing Restrictions Per Provider Order: No       Mobility Bed Mobility Overal bed mobility: Modified Independent             General bed mobility comments: seated EOB upon entry    Transfers Overall transfer level: Modified independent Equipment used: Rollator (4 wheels) Transfers: Sit to/from Stand Sit to Stand: Modified independent (Device/Increase time)           General transfer comment: good ability to stand from edge of bed     Balance Overall balance assessment: Needs assistance Sitting-balance support: No upper  extremity supported Sitting balance-Leahy Scale: Good Sitting balance - Comments: EOB   Standing balance support: Bilateral upper extremity supported, During functional activity, Reliant on assistive device for balance Standing balance-Leahy Scale: Fair Standing balance comment: Pt utilizes rollator and supervision for gait.                           ADL either performed or assessed with clinical judgement   ADL Overall ADL's : Needs assistance/impaired                                     Functional mobility during ADLs: Supervision/safety;Rollator (4 wheels) General ADL Comments: emphasis of session on UE HEP    Extremity/Trunk Assessment              Vision       Perception     Praxis     Communication Communication Communication: No apparent difficulties   Cognition Arousal: Alert Behavior During Therapy: WFL for tasks assessed/performed Cognition: No apparent impairments                               Following commands: Intact        Cueing   Cueing Techniques: Verbal cues  Exercises Exercises: General Upper Extremity General Exercises - Upper Extremity Shoulder Flexion: AROM, Strengthening, Both, 10 reps, Theraband (3 sets) Theraband Level (Shoulder Flexion): Level 2 (Red) Shoulder Horizontal ABduction: AROM, Strengthening, 10 reps, Theraband (3 sets) Theraband Level (Shoulder Horizontal Abduction): Level 2 (Red)  Elbow Flexion: AROM, Strengthening, 10 reps, Theraband (3 sets) Theraband Level (Elbow Flexion): Level 1 (Yellow), Level 2 (Red) Elbow Extension: AROM, 10 reps, Theraband (3 sets) Theraband Level (Elbow Extension): Level 2 (Red)    Shoulder Instructions       General Comments VSS on RA    Pertinent Vitals/ Pain       Pain Assessment Pain Assessment: No/denies pain  Home Living                                          Prior Functioning/Environment               Frequency  Min 2X/week        Progress Toward Goals  OT Goals(current goals can now be found in the care plan section)  Progress towards OT goals: Progressing toward goals  Acute Rehab OT Goals Patient Stated Goal: none stated OT Goal Formulation: With patient Time For Goal Achievement: 10/24/23 Potential to Achieve Goals: Good ADL Goals Pt/caregiver will Perform Home Exercise Program: Increased strength;Both right and left upper extremity;With theraband;With written HEP provided Additional ADL Goal #1: Pt to implement at least 3 energy conservation strategies during ADLs, IADLs and mobility  Plan      Co-evaluation                 AM-PAC OT "6 Clicks" Daily Activity     Outcome Measure   Help from another person eating meals?: None Help from another person taking care of personal grooming?: None Help from another person toileting, which includes using toliet, bedpan, or urinal?: A [place] Help from another person bathing (including washing, rinsing, drying)?: A [place] Help from another person to put on and taking off regular upper body clothing?: None Help from another person to put on and taking off regular lower body clothing?: A Jacuinde 6 Click Score: 21    End of Session Equipment Utilized During Treatment: Rollator (4 wheels)  OT Visit Diagnosis: Muscle weakness (generalized) (M62.81);Pain   Activity Tolerance Patient tolerated treatment well   Patient Left in chair;with call bell/phone within reach;Other (comment) (NT taking pt to respiratory following OT session)   Nurse Communication Mobility status        Time: 1610-9604 OT Time Calculation (min): 33 min  Charges: OT General Charges $OT Visit: 1 Visit OT Treatments $Self Care/Home Management : 8-22 mins $Therapeutic Exercise: 8-22 mins  [name], BS, OTA/S   [name] 10/14/2023, 2:49 PM

## 2023-10-14 NOTE — TOC Transition Note (Signed)
 Transition of Care [facility]) - Discharge Note   Patient Details  Name: Lynn Estrada MRN: [id number] Date of Birth: Feb 27, 1961  Transition of Care [facility]) CM/SW Contact:  Ernst Heap Phone Number: [phone] 10/14/2023, 11:08 AM   Clinical Narrative:  HF CSW spoke with the patient over the phone in regards to her transportation plan at dc. Patient stated that her husband will be providing transportation home.   CSW informed patient that her follow up appointments are on the dc summary.      Final next level of care: Home/Self Care Barriers to Discharge: Continued Medical Work up   Patient Goals and CMS Choice Patient states their goals for this hospitalization and ongoing recovery are:: return home   Choice offered to / list presented to : NA      Discharge Placement                       Discharge Plan and Services Additional resources added to the After Visit Summary for     Discharge Planning Services: CM Consult Post Acute Care Choice: NA          DME Arranged: N/A DME Agency: NA                  Social Drivers of Health (SDOH) Interventions SDOH Screenings   Food Insecurity: No Food Insecurity (10/08/2023)  Housing: Low Risk  (10/08/2023)  Transportation Needs: No Transportation Needs (10/08/2023)  Utilities: Not At Risk (10/08/2023)  Depression (PHQ2-9): Low Risk  (03/16/2022)  Financial Resource Strain: Low Risk  (09/24/2023)   Received from [facility]  Physical Activity: Unknown (09/24/2023)   Received from [facility]  Social Connections: Patient Declined (09/30/2023)  Stress: No Stress Concern Present (09/24/2023)   Received from [facility]  Tobacco Use: Medium Risk (10/08/2023)     Readmission Risk Interventions    10/08/2023    4:15 PM  Readmission Risk Prevention Plan  Transportation Screening Complete  PCP or Specialist Appt within 3-5 Days Complete  HRI or Home Care Consult Complete  Palliative Care Screening Not  Applicable  Medication Review (RN Care Manager) Complete

## 2023-10-14 NOTE — Progress Notes (Signed)
 Advanced Heart Failure Rounding Note  Cardiologist: Dr. Gala Romney  Chief Complaint: Heart Failure Subjective:    Remains net negative over the weekend. Weight stable.  sCr stable 1.16. Will get PFTs today.   Breathing is much better this morning. Reports that she has not been sleeping well.   Objective:    Weight Range: 81.8 kg Body mass index is 23.15 kg/m.   Vital Signs:   Temp:  [97.6 F (36.4 C)-98.6 F (37 C)] 98.6 F (37 C) (04/14 0442) Pulse Rate:  [60-76] 60 (04/14 0442) Resp:  [16-17] 17 (04/14 0442) BP: (96-103)/(62-68) 98/64 (04/14 0442) SpO2:  [95 %-97 %] 97 % (04/14 0442) Weight:  [81.8 kg] 81.8 kg (04/14 0442) Last BM Date : 10/13/23  Weight change: Filed Weights   10/12/23 0436 10/13/23 0601 10/14/23 0442  Weight: 81.4 kg 81.2 kg 81.8 kg   Intake/Output:  Intake/Output Summary (Last 24 hours) at 10/14/2023 0706 Last data filed at 10/14/2023 0700 Gross per 24 hour  Intake 840 ml  Output 1000 ml  Net -160 ml    Physical Exam    General: Well appearing. No distress on RA Cardiac: JVP flat. S1 and S2 present. No murmurs or rub. Extremities: Warm and dry.  No peripheral edema.  Neuro: Alert and oriented x3. Affect pleasant. Moves all extremities without difficulty.  Telemetry   AV paced in 60s (personally reviewed)  EKG    No new EKG to review   Labs    CBC Recent Labs    10/11/23 0806 10/13/23 0243  WBC  --  4.7  HGB 10.5*  10.5* 11.1*  HCT 31.0*  31.0* 34.2*  MCV  --  87.2  PLT  --  247    Basic Metabolic Panel Recent Labs    54/09/81 0243 10/14/23 0338  NA 136 137  K 4.6 4.3  CL 104 104  CO2 22 22  GLUCOSE 123* 106*  BUN 42* 44*  CREATININE 1.16* 1.16*  CALCIUM 9.9 9.6  MG 2.6* 2.4   BNP: BNP (last 3 results) Recent Labs    10/02/23 1123 10/07/23 1442 10/10/23 0222  BNP 473.8* 916.5* 373.5*   Imaging   No results found.  Medications:    Scheduled Medications:  apixaban  5 mg Oral BID    atorvastatin  10 mg Oral q AM   bisoprolol  2.5 mg Oral Daily   digoxin  125 mcg Oral Daily   feeding supplement  237 mL Oral BID BM   insulin aspart  0-9 Units Subcutaneous TID WC   midodrine  5 mg Oral TID WC   spironolactone  25 mg Oral Daily   Infusions:  PRN Medications: acetaminophen **OR** acetaminophen, albuterol, benzonatate, furosemide, melatonin, ondansetron (ZOFRAN) IV, mouth rinse, senna-docusate, traZODone  [name] w/ h/o nephrolithiasis s/p partial right nephrectomy, CVA, recently diagnosed PE and chronic systolic heart failure, LBBB s/p CRT-D non responder w/ most recent EF chronically low at 20-25%, multiple admissions for CHF in the last 3 months, readmitted w/ a/c CHF w/ NYHA Class IIIb symptoms.   Assessment/Plan   1. Acute on Chronic Combined Systolic/Diastolic Heart Failure, NICM - Echo 1/23 EF 20-25%, RV normal, grade II DD , and septal -lateral dyssnchrony - cMRI 1/23 possible myocarditis  LVEF 18% Suggestive of LBBB, RVEF 33% - [facility] 1/23 min CAD. RA 6 PCWP 21, CO 6.8/ CI 3.1 - Echo 4/23: EF 30-35% -> EF improving but still with significant LV dysfunction (  suspect resolving myocarditis) - EF dropped again in 10/23. Stable at 20-25% with nl RV - s/p Abbott CRT-D 7/24 for LBBB  - Admitted w/ NYHA Class IIIb-IV symptoms w/ volume overload  - RHC 10/11/23: RA 7, PA 51/30 (38), PCWP 31, CO/CI(TD) 4.44/2.12 - start Lasix 40 mg daily - continue spironolactone 25 mg daily  - continue bisoprolol 2.5 mg daily  - continue digoxin 0.125 mg daily  - off Entresto due to hypotension - BP too soft for other GDMT. Continue midodrine 5 mg tid - Multiple admits for a/c CHF in last several months + down-titration of GDMT due to hypotension and unintentional wt loss/ signs of cardiac cachectica. Concern she is nearing end-stage HF. Needs w/u for advanced therapies. Currently she is too deconditioned for transplant. Will need to work w/ PT. May be good  candidate for LVAD. RV normal. SCr ok. Will assess PFTs today   2. ? PNA On empiric abx per IM, doxycyline, based on CXR/CT, though suspect findings most likely 2/2 CHF. Respiratory panel negative and PCT also normal, < 0.10  - completed Doxy x 5 day course    3. PE - diagnosed previous admit 3/25, small burden RLL. LE venous dopplers + for SVT. No DVT   - on Eliquis. Repeat CT of chest this admit shows interval improvement of PE burden, diminished from initial scan. No new or progressive pulmonary emboli.  - continue Eliquis 5 mg bid    4. Fragility/Decondition - needs to ambulate more and will need to get stronger to be considered for transplant/VAD - PT/OT consulted   Length of Stay: 7  [place] Aadil Sur, NP  10/14/2023, 7:06 AM  Advanced Heart Failure Team Pager [phone] (M-F; 7a - 5p)  Please contact [facility] for night-coverage after hours (5p -7a ) and weekends on [email or web address]

## 2023-10-15 ENCOUNTER — Inpatient Hospital Stay (HOSPITAL_COMMUNITY)

## 2023-10-15 ENCOUNTER — Ambulatory Visit: Payer: Medicaid Other

## 2023-10-15 ENCOUNTER — Other Ambulatory Visit (HOSPITAL_COMMUNITY): Payer: Self-pay

## 2023-10-15 DIAGNOSIS — J189 Pneumonia, unspecified organism: Secondary | ICD-10-CM | POA: Diagnosis not present

## 2023-10-15 DIAGNOSIS — I447 Left bundle-branch block, unspecified: Secondary | ICD-10-CM | POA: Diagnosis not present

## 2023-10-15 DIAGNOSIS — I5043 Acute on chronic combined systolic (congestive) and diastolic (congestive) heart failure: Secondary | ICD-10-CM | POA: Diagnosis not present

## 2023-10-15 LAB — BASIC METABOLIC PANEL WITH GFR
Anion gap: 10 (ref 5–15)
BUN: 42 mg/dL — ABNORMAL HIGH (ref 8–23)
CO2: 24 mmol/L (ref 22–32)
Calcium: 9.9 mg/dL (ref 8.9–10.3)
Chloride: 103 mmol/L (ref 98–111)
Creatinine, Ser: 1.07 mg/dL — ABNORMAL HIGH (ref 0.44–1.00)
GFR, Estimated: 59 mL/min — ABNORMAL LOW (ref >60)
Glucose, Bld: 105 mg/dL — ABNORMAL HIGH (ref 70–99)
Potassium: 4.1 mmol/L (ref 3.5–5.1)
Sodium: 137 mmol/L (ref 135–145)

## 2023-10-15 LAB — PULMONARY FUNCTION TEST
DL/VA % pred: 95 %
DL/VA: 3.76 ml/min/mmHg/L
DLCO cor % pred: 58 %
DLCO cor: 16.19 ml/min/mmHg
DLCO unc % pred: 54 %
DLCO unc: 14.91 ml/min/mmHg
FEF 25-75 Post: 3.28 L/s
FEF 25-75 Pre: 2.51 L/s
FEF2575-%Change-Post: 30 %
FEF2575-%Pred-Post: 112 %
FEF2575-%Pred-Pre: 85 %
FEV1-%Change-Post: 4 %
FEV1-%Pred-Post: 68 %
FEV1-%Pred-Pre: 66 %
FEV1-Post: 2.45 L
FEV1-Pre: 2.35 L
FEV1FVC-%Change-Post: 4 %
FEV1FVC-%Pred-Pre: 107 %
FEV6-%Change-Post: 0 %
FEV6-%Pred-Post: 63 %
FEV6-%Pred-Pre: 63 %
FEV6-Post: 2.8 L
FEV6-Pre: 2.82 L
FEV6FVC-%Pred-Post: 103 %
FEV6FVC-%Pred-Pre: 103 %
FVC-%Change-Post: 0 %
FVC-%Pred-Post: 61 %
FVC-%Pred-Pre: 61 %
FVC-Post: 2.8 L
FVC-Pre: 2.82 L
Post FEV1/FVC ratio: 87 %
Post FEV6/FVC ratio: 100 %
Pre FEV1/FVC ratio: 83 %
Pre FEV6/FVC Ratio: 100 %
RV % pred: 61 %
RV: 1.55 L
TLC % pred: 68 %
TLC: 4.51 L

## 2023-10-15 LAB — GLUCOSE, CAPILLARY
Glucose-Capillary: 95 mg/dL (ref 70–99)
Glucose-Capillary: 102 mg/dL — ABNORMAL HIGH (ref 70–99)

## 2023-10-15 LAB — MAGNESIUM: Magnesium: 2.3 mg/dL (ref 1.7–2.4)

## 2023-10-15 MED ORDER — BISOPROLOL FUMARATE 5 MG PO TABS
2.5000 mg | ORAL_TABLET | Freq: Every day | ORAL | 0 refills | Status: DC
  Filled 2023-10-15: qty 15, 30d supply, fill #0

## 2023-10-15 MED ORDER — TORSEMIDE 20 MG PO TABS
40.0000 mg | ORAL_TABLET | Freq: Every day | ORAL | Status: DC
Start: 2023-10-15 — End: 2023-10-15
  Administered 2023-10-15: 40 mg via ORAL
  Filled 2023-10-15: qty 2

## 2023-10-15 MED ORDER — HYDROCORTISONE 1 % EX CREA
TOPICAL_CREAM | CUTANEOUS | Status: DC | PRN
Start: 2023-10-15 — End: 2023-10-15
  Filled 2023-10-15: qty 28

## 2023-10-15 MED ORDER — EMPAGLIFLOZIN 10 MG PO TABS
10.0000 mg | ORAL_TABLET | Freq: Every day | ORAL | Status: DC
  Administered 2023-10-15: 10 mg via ORAL
  Filled 2023-10-15: qty 1

## 2023-10-15 MED ORDER — ALBUTEROL SULFATE (2.5 MG/3ML) 0.083% IN NEBU
2.5000 mg | INHALATION_SOLUTION | Freq: Once | RESPIRATORY_TRACT | Status: AC
  Administered 2023-10-15: 2.5 mg via RESPIRATORY_TRACT

## 2023-10-15 MED ORDER — TORSEMIDE 20 MG PO TABS
40.0000 mg | ORAL_TABLET | Freq: Every day | ORAL | 0 refills | Status: DC
  Filled 2023-10-15: qty 60, 30d supply, fill #0

## 2023-10-15 NOTE — TOC Transition Note (Signed)
 Transition of Care [facility]) - Discharge Note   Patient Details  Name: Lynn Estrada MRN: [id number] Date of Birth: February 01, 1961  Transition of Care [facility]) CM/SW Contact:  Tom-Johnson, Angelique Ken, RN Phone Number: 10/15/2023, 1:32 PM   Clinical Narrative:     Patient is scheduled for discharge today.  Readmission Risk Assessment done. Outpatient referral, hospital f/u and discharge instructions on AVS. Prescriptions sent to [facility] pharmacy and patient will receive meds prior discharge. Husband, Richard to transport at discharge.  No further TOC needs noted.       Final next level of care: OP Rehab Barriers to Discharge: Barriers Resolved   Patient Goals and CMS Choice Patient states their goals for this hospitalization and ongoing recovery are:: return home   Choice offered to / list presented to : NA      Discharge Placement                Patient to be transferred to facility by: Husband Name of family member notified: Richard    Discharge Plan and Services Additional resources added to the After Visit Summary for     Discharge Planning Services: CM Consult Post Acute Care Choice: NA          DME Arranged: N/A DME Agency: NA                  Social Drivers of Health (SDOH) Interventions SDOH Screenings   Food Insecurity: No Food Insecurity (10/08/2023)  Housing: Low Risk  (10/08/2023)  Transportation Needs: No Transportation Needs (10/08/2023)  Utilities: Not At Risk (10/08/2023)  Depression (PHQ2-9): Low Risk  (03/16/2022)  Financial Resource Strain: Low Risk  (09/24/2023)   Received from [facility]  Physical Activity: Unknown (09/24/2023)   Received from [facility]  Social Connections: Patient Declined (09/30/2023)  Stress: No Stress Concern Present (09/24/2023)   Received from [facility]  Tobacco Use: Medium Risk (10/08/2023)     Readmission Risk Interventions    10/08/2023    4:15 PM  Readmission Risk Prevention Plan  Transportation  Screening Complete  PCP or Specialist Appt within 3-5 Days Complete  HRI or Home Care Consult Complete  Palliative Care Screening Not Applicable  Medication Review (RN Care Manager) Complete

## 2023-10-15 NOTE — Plan of Care (Signed)

## 2023-10-15 NOTE — Plan of Care (Signed)
  Problem: Education: Goal: Ability to describe self-care measures that may prevent or decrease complications (Diabetes Survival Skills Education) will improve Outcome: Progressing   Problem: Coping: Goal: Ability to adjust to condition or change in health will improve Outcome: Progressing   Problem: Fluid Volume: Goal: Ability to maintain a balanced intake and output will improve Outcome: Progressing   Problem: Nutritional: Goal: Maintenance of adequate nutrition will improve Outcome: Progressing   Problem: Tissue Perfusion: Goal: Adequacy of tissue perfusion will improve Outcome: Progressing   Problem: Education: Goal: Knowledge of General Education information will improve Description: Including pain rating scale, medication(s)/side effects and non-pharmacologic comfort measures Outcome: Progressing   

## 2023-10-15 NOTE — TOC Progression Note (Signed)
 Transition of Care [facility]) - Progression Note    Patient Details  Name: Lynn Estrada MRN: [id number] Date of Birth: 08/18/1960  Transition of Care [facility] CM/SW Contact  Benjiman Bras, RN Phone Number: [phone] 10/15/2023, 9:58 AM  Clinical Narrative:      Marnie Siren rep, Marlou Sims for [employer]. Spoke to pt and she feels Rollator is not tall enough. She wants to keep Rollator. She will go online to special order a taller device. Iran Manna states they have the standard size that should accommodate her height by lifting the arms higher.    Expected Discharge Plan: Home/Self Care Barriers to Discharge: Continued Medical Work up  Expected Discharge Plan and Services   Discharge Planning Services: CM Consult Post Acute Care Choice: NA Living arrangements for the past 2 months: Single Family Home                 DME Arranged: N/A DME Agency: NA                   Social Determinants of Health (SDOH) Interventions SDOH Screenings   Food Insecurity: No Food Insecurity (10/08/2023)  Housing: Low Risk  (10/08/2023)  Transportation Needs: No Transportation Needs (10/08/2023)  Utilities: Not At Risk (10/08/2023)  Depression (PHQ2-9): Low Risk  (03/16/2022)  Financial Resource Strain: Low Risk  (09/24/2023)   Received from [facility]  Physical Activity: Unknown (09/24/2023)   Received from [facility]  Social Connections: Patient Declined (09/30/2023)  Stress: No Stress Concern Present (09/24/2023)   Received from [facility]  Tobacco Use: Medium Risk (10/08/2023)    Readmission Risk Interventions    10/08/2023    4:15 PM  Readmission Risk Prevention Plan  Transportation Screening Complete  PCP or Specialist Appt within 3-5 Days Complete  HRI or Home Care Consult Complete  Palliative Care Screening Not Applicable  Medication Review (RN Care Manager) Complete

## 2023-10-15 NOTE — Progress Notes (Signed)
 Mobility Specialist Progress Note:   10/15/23 1010  Mobility  Activity Ambulated independently in hallway  Level of Assistance Modified independent, requires aide device or extra time  Assistive Device Four wheel walker  Distance Ambulated (ft) 200 ft  Activity Response Tolerated well  Mobility Referral Yes  Mobility visit 1 Mobility  Mobility Specialist Start Time (ACUTE ONLY) 1010  Mobility Specialist Stop Time (ACUTE ONLY) 1020  Mobility Specialist Time Calculation (min) (ACUTE ONLY) 10 min   Pt agreeable to mobility session. Required no physical assistance throughout. Continues to c/o generalized weakness, no other complaints. Back in bed with all needs met.   Lynn Estrada Mobility Specialist Please contact via SecureChat or  Rehab office at [phone]

## 2023-10-15 NOTE — Progress Notes (Signed)
 Physical Therapy Treatment Patient Details Name: Lynn Estrada MRN: [id number] DOB: 1961-03-08 Today's Date: 10/15/2023   History of Present Illness Lynn Estrada is a 63 y.o. female admitted 10/07/23 for suspected HCAP. Pt presented with SOB. CTA chest showed right lower lobe pneumonia, pulmonary edema and effusion. PMH of acute PE, systolic CHF EF 25%., DM2, HLD, HTN, GERD, nephrolithiasis, partial right nephrectomy, LBBB, and anemia of chronic disease. Of note recent hospitalization 3/31-4/2 for RLL PE.    PT Comments  Pt greeted supine in bed, pleasant and agreeable to PT session. She ambulated ~213ft using rollator with supervision. Pt demonstrated good technique and safe use of rollator throughout. Reviewed energy conservation strategies. Educated pt on exercising with CHF with handout provided. Recommend OPPT for increase strength, improve cardiopulmonary endurance, and decrease fall risk.    If plan is discharge home, recommend the following: A Figley help with walking and/or transfers;A Neenan help with bathing/dressing/bathroom;Assistance with cooking/housework;Assist for transportation;Help with stairs or ramp for entrance   Can travel by private vehicle        Equipment Recommendations  Rollator (4 wheels)    Recommendations for Other Services       Precautions / Restrictions Precautions Precautions: Fall Recall of Precautions/Restrictions: Intact Restrictions Weight Bearing Restrictions Per Provider Order: No     Mobility  Bed Mobility Overal bed mobility: Modified Independent             General bed mobility comments: Pt sat up on L side of bed with HOB slightly elevated and bed slightly raised given pt's height. She returned to seated EOB at end of session.    Transfers Overall transfer level: Modified independent Equipment used: Rollator (4 wheels) Transfers: Sit to/from Stand             General transfer comment: Pt stood from slightly raised  bed given her height. She demonstrated proper locking/unlocking of breaks and powered up without physical assistance. Good eccentric control with sitting.    Ambulation/Gait Ambulation/Gait assistance: Supervision Gait Distance (Feet): 250 Feet Assistive device: Rollator (4 wheels) Gait Pattern/deviations: WFL(Within Functional Limits), Step-through pattern Gait velocity: WFL Gait velocity interpretation: <1.8 ft/sec, indicate of risk for recurrent falls   General Gait Details: Pt ambulated with a reciprocal gait pattern, even weight shift, and good foot clearence. She c/o neuropathy and heaviness in RLE during gait. She demonstrated good safety awarenss, maintaining body inside the AD at all times and  navigating obstacles in room/hallway. She reported a 9/20 on the Borg RPE scale following gait.   Stairs             Wheelchair Mobility     Tilt Bed    Modified Rankin (Stroke Patients Only)       Balance Overall balance assessment: Mild deficits observed, not formally tested                                          Communication Communication Communication: No apparent difficulties  Cognition Arousal: Alert Behavior During Therapy: WFL for tasks assessed/performed   PT - Cognitive impairments: No apparent impairments                         Following commands: Intact      Cueing Cueing Techniques: Verbal cues  Exercises General Exercises - Lower Extremity Hip ABduction/ADduction: Standing, Both, AROM, 10 reps  Hip Flexion/Marching: Standing, Both, AROM, 10 reps    General Comments General comments (skin integrity, edema, etc.): VSS on RA. Educated pt on exercising with CHF, recommended fitness guidelines, and the Borg RPE scale. Provided her with handout.      Pertinent Vitals/Pain Pain Assessment Pain Assessment: No/denies pain    Home Living                          Prior Function            PT Goals  (current goals can now be found in the care plan section) Acute Rehab PT Goals Patient Stated Goal: Go Home Progress towards PT goals: Progressing toward goals    Frequency    Min 2X/week      PT Plan      Co-evaluation              AM-PAC PT "6 Clicks" Mobility   Outcome Measure  Help needed turning from your back to your side while in a flat bed without using bedrails?: None Help needed moving from lying on your back to sitting on the side of a flat bed without using bedrails?: None Help needed moving to and from a bed to a chair (including a wheelchair)?: None Help needed standing up from a chair using your arms (e.g., wheelchair or bedside chair)?: None Help needed to walk in hospital room?: A Rajagopalan Help needed climbing 3-5 steps with a railing? : A Cunning 6 Click Score: 22    End of Session Equipment Utilized During Treatment: Gait belt Activity Tolerance: Patient tolerated treatment well Patient left: in bed;with call bell/phone within reach Nurse Communication: Mobility status PT Visit Diagnosis: Difficulty in walking, not elsewhere classified (R26.2);Muscle weakness (generalized) (M62.81)     Time: 4782-9562 PT Time Calculation (min) (ACUTE ONLY): 21 min  Charges:    $Gait Training: 8-22 mins PT General Charges $$ ACUTE PT VISIT: 1 Visit                     Glenford Lanes, PT, DPT Acute Rehabilitation Services Office: [phone] Secure Chat Preferred  Riva Chester 10/15/2023, 1:19 PM

## 2023-10-15 NOTE — Discharge Summary (Signed)
 Physician Discharge Summary  Lynn Estrada BJY:[id number] DOB: 06-17-1961 DOA: 10/07/2023  PCP: Porfirio Oar, PA  Admit date: 10/07/2023 Discharge date: 10/15/2023  Admitted From: Home Disposition: Home  Recommendations for Outpatient Follow-up:  Follow up with PCP in 1-2 weeks With cardiology as scheduled:  Home Health: None Equipment/Devices: None   Discharge Condition: Stable CODE STATUS: Full Diet recommendation: Low-salt low-fat low-carb diet  Brief/Interim Summary: Patient is a 63 year old with history of acute PE, systolic CHF EF 25%., DM2, HLD, anemia of chronic disease, admitted to [facility] recently for pneumonia now being admitted again for shortness of breath with concerns of fluid overload recurrent HCAP.  During recent admission patient completed 5 days of cefepime and azithromycin.  CTA chest showed right lower lobe pneumonia, pulmonary edema and effusion.  Eventually thought to be in fluid overload therefore started on diuresis, cardiology team consulted.  RHC done 4/11 showing volume overload.  Patient now more volume appropriate, goal-directed therapy somewhat limited by hypotension requiring midodrine but otherwise heart failure team recommending discharge home with outpatient follow-up.  Discharge Diagnoses:  Principal Problem:   HCAP (healthcare-associated pneumonia) Active Problems:   HLD (hyperlipidemia)   Acute on chronic combined systolic and diastolic CHF (congestive heart failure) (HCC)   SOB (shortness of breath)   History of pulmonary embolism   DM2 (diabetes mellitus, type 2) (HCC)   Anemia of chronic disease  Acute on chronic congestive heart failure with reduced EF, 25% Status post BiV PPM History of nonobstructive CAD -Diuresed well, echocardiogram as prior, heart failure team recommending medication changes as below including torsemide, goal-directed therapy limited by hypotension.  Recommend close outpatient follow-up with PCP and  cardiology in the next 1 to 2 weeks as scheduled.   Acute respiratory distress, resolved Healthcare acquired pneumonia -Likely secondary to above -CTA chest-reviewed.  Elevated BNP.  Procalcitonin is negative.  Very low suspicion for pneumonia.   -Respiratory panel is negative -Bronchodilators, I-S/flutter valve.   -Patient has completed a 5-day course of doxycycline.   History of pulmonary embolism - On Eliquis   Diabetes mellitus type 2 - Accu-Cheks and sliding scale   Hyperlipidemia - Statin   Anemia of chronic disease - Baseline hemoglobin   History of CVA - November 2024.  Now on Eliquis. Plavix discontinued since previous admission  Discharge Instructions  Discharge Instructions     (HEART FAILURE PATIENTS) Call MD:  Anytime you have any of the following symptoms: 1) 3 pound weight gain in 24 hours or 5 pounds in 1 week 2) shortness of breath, with or without a dry hacking cough 3) swelling in the hands, feet or stomach 4) if you have to sleep on extra pillows at night in order to breathe.   Complete by: As directed    Ambulatory referral to Physical Therapy   Complete by: As directed    Call MD for:  difficulty breathing, headache or visual disturbances   Complete by: As directed    Call MD for:  extreme fatigue   Complete by: As directed    Call MD for:  hives   Complete by: As directed    Call MD for:  persistant dizziness or light-headedness   Complete by: As directed    Call MD for:  persistant nausea and vomiting   Complete by: As directed    Call MD for:  redness, tenderness, or signs of infection (pain, swelling, redness, odor or green/yellow discharge around incision site)   Complete by: As directed  Call MD for:  severe uncontrolled pain   Complete by: As directed    Call MD for:  temperature >100.4   Complete by: As directed    Diet - low sodium heart healthy   Complete by: As directed    Increase activity slowly   Complete by: As directed        Allergies as of 10/15/2023       Reactions   Latex Rash, Hives   Bactrim [sulfamethoxazole-trimethoprim] Rash   Carvedilol Itching   Metoprolol Itching, Rash        Medication List     STOP taking these medications    Apixaban Starter Pack (10mg  and 5mg ) Commonly known as: ELIQUIS STARTER PACK   furosemide 20 MG tablet Commonly known as: Lasix       TAKE these medications    acetaminophen 500 MG tablet Commonly known as: TYLENOL Take 500-1,000 mg by mouth every 6 (six) hours as needed (for pain.).   atorvastatin 10 MG tablet Commonly known as: LIPITOR Take 10 mg by mouth in the morning.   benzonatate 200 MG capsule Commonly known as: TESSALON Take 1 capsule (200 mg total) by mouth 3 (three) times daily as needed for cough (not relieved with tussionex).   bisoprolol 5 MG tablet Commonly known as: ZEBETA Take 0.5 tablets (2.5 mg total) by mouth daily. Start taking on: October 16, 2023   chlorpheniramine-HYDROcodone 10-8 MG/5ML Commonly known as: TUSSIONEX Take 5 mLs by mouth every 12 (twelve) hours as needed for cough.   digoxin 0.125 MG tablet Commonly known as: LANOXIN Take 1 tablet (125 mcg total) by mouth daily.   Jardiance 10 MG Tabs tablet Generic drug: empagliflozin Take 10 mg by mouth daily.   metFORMIN 500 MG tablet Commonly known as: GLUCOPHAGE Take 1 tablet by mouth 2 (two) times daily with a meal.   midodrine 5 MG tablet Commonly known as: PROAMATINE Take 1 tablet (5 mg total) by mouth 3 (three) times daily with meals.   ondansetron 4 MG disintegrating tablet Commonly known as: ZOFRAN-ODT Take 4 mg by mouth every 8 (eight) hours as needed for nausea or vomiting (dissolve orally).   ONE-A-DAY WOMENS PO Take 1 tablet by mouth daily with breakfast.   spironolactone 25 MG tablet Commonly known as: ALDACTONE Take 1 tablet (25 mg total) by mouth daily.   torsemide 20 MG tablet Commonly known as: DEMADEX Take 2 tablets (40 mg total) by  mouth daily. Start taking on: October 16, 2023   TUMS E-X PO Take 1 tablet by mouth daily.               Durable Medical Equipment  (From admission, onward)           Start     Ordered   10/15/23 1033  Heart failure home health orders  (Heart failure home health orders / Face to face)  Once       Comments: Heart Failure Follow-up Care:  Verify follow-up appointments per Patient Discharge Instructions. Confirm transportation arranged. Reconcile home medications with discharge medication list. Remove discontinued medications from use. Assist patient/caregiver to manage medications using pill box. Reinforce low sodium food selection Assessments: Vital signs and oxygen saturation at each visit. Assess home environment for safety concerns, caregiver support and availability of low-sodium foods. Consult Child psychotherapist, PT/OT, Dietitian, and CNA based on assessments. Perform comprehensive cardiopulmonary assessment. Notify MD for any change in condition or weight gain of 3 pounds in one day or 5  pounds in one week with symptoms. Daily Weights and Symptom Monitoring: Ensure patient has access to scales. Teach patient/caregiver to weigh daily before breakfast and after voiding using same scale and record.    Teach patient/caregiver to track weight and symptoms and when to notify Provider. Activity: Develop individualized activity plan with patient/caregiver.   Question Answer Comment  Heart Failure Follow-up Care [facility] ([facility] at [phone]   Lab frequency Other see comments   Fax lab results to Other see comments   Diet Low Sodium Heart Healthy   Fluid restrictions: 1800 mL Fluid      10/15/23 1034   10/14/23 1240  For home use only DME 4 wheeled rolling walker with seat  Once       Question Answer Comment  Patient needs a walker to treat with the following condition Heart failure [facility])   Patient needs a walker to treat with the following condition  Physical deconditioning      10/14/23 1239            Follow-up Information     Porfirio Oar, PA Follow up on 10/15/2023.   Specialty: Family Medicine Why: 4:30 hospital follow up with [facility]ion information: [address] [place] [address] [phone]         [facility] Follow up on 10/30/2023.   Specialty: Cardiology Why: at 9:30am Heart and Vascular Tower Entrance C Contact information: [address] [place] [address] [phone]        [facility] Follow up.   Specialty: Rehabilitation Why: Call to schedule your outpatient appointment. Contact information: [address] [place] [address] [phone]               Allergies  Allergen Reactions   Latex Rash and Hives   Bactrim [Sulfamethoxazole-Trimethoprim] Rash   Carvedilol Itching   Metoprolol Itching and Rash    Consultations: Cardiology, heart failure team  Procedures/Studies: CARDIAC CATHETERIZATION Result Date: 10/14/2023 HEMODYNAMICS: RA:   7 mmHg (mean) RV:   49/7 mmHg PA:   50/31 mmHg (38 mean) PCWP:  31 mmHg (mean)    Estimated Fick CO/CI   6.3 L/min, 3 L/min/m2 Thermodilution CO/CI   4.4 L/min, 2.1 L/min/m2    TPG    7  mmHg     PVR     1.1-1.6 Wood Units PAPi      2.7  IMPRESSION: Mildly elevated pre capillary filling pressures Severely elevated PCWP Normal PVR Normal CI by Fick; moderately reduced by Td Aditya Sabharwal 8:59 AM   CT ABDOMEN PELVIS W CONTRAST Result Date: 10/07/2023 CLINICAL DATA:  Abdominal pain. EXAM: CT ABDOMEN AND PELVIS WITH CONTRAST TECHNIQUE: Multidetector CT imaging of the abdomen and pelvis was performed using the standard protocol following bolus administration of intravenous contrast. RADIATION DOSE REDUCTION: This exam was performed according to the departmental dose-optimization  program which includes automated exposure control, adjustment of the mA and/or kV according to patient size and/or use of iterative reconstruction technique. CONTRAST:  75mL OMNIPAQUE IOHEXOL 350 MG/ML SOLN COMPARISON:  August 04, 2023 FINDINGS: Lower chest: Marked severity right lower lobe infiltrate is seen. Moderate severity lingular and left lower lobe linear atelectasis is also noted. There is a small right pleural effusion. Mild to moderate severity areas of intraluminal low attenuation are seen involving multiple lower lobe branches of the right pulmonary artery (axial CT images 1  through 7, CT series 14). Hepatobiliary: No focal liver abnormality is seen. No gallstones, gallbladder wall thickening, or biliary dilatation. Pancreas: Unremarkable. No pancreatic ductal dilatation or surrounding inflammatory changes. Spleen: Normal in size without focal abnormality. Adrenals/Urinary Tract: Adrenal glands are unremarkable. Kidneys are normal in size, without obstructing renal calculi, focal lesion, or hydronephrosis. Numerous bilateral subcentimeter non-obstructing renal calculi are seen. Bladder is unremarkable. Stomach/Bowel: Stomach is within normal limits. Appendix appears normal. No evidence of bowel wall thickening, distention, or inflammatory changes. Vascular/Lymphatic: Aortic atherosclerosis. No enlarged abdominal or pelvic lymph nodes. Reproductive: Status post hysterectomy. No adnexal masses. Other: No abdominal wall hernia or abnormality. No abdominopelvic ascites. Musculoskeletal: Moderate to marked severity degenerative changes are seen at the level of L5-S1. IMPRESSION: 1. Marked severity right lower lobe infiltrate with a small right pleural effusion. 2. Mild to moderate severity known right lower lobe pulmonary embolism. 3. Numerous bilateral subcentimeter non-obstructing renal calculi. 4. Moderate to marked severity degenerative changes at the level of L5-S1. 5. Aortic atherosclerosis. Aortic  Atherosclerosis (ICD10-I70.0). Electronically Signed   By: Virgle Grime M.D.   On: 10/07/2023 22:54   CT Angio Chest PE W and/or Wo Contrast Result Date: 10/07/2023 CLINICAL DATA:  Pulmonary embolism (PE) suspected, high prob Shortness of breath. EXAM: CT ANGIOGRAPHY CHEST WITH CONTRAST TECHNIQUE: Multidetector CT imaging of the chest was performed using the standard protocol during bolus administration of intravenous contrast. Multiplanar CT image reconstructions and MIPs were obtained to evaluate the vascular anatomy. RADIATION DOSE REDUCTION: This exam was performed according to the departmental dose-optimization program which includes automated exposure control, adjustment of the mA and/or kV according to patient size and/or use of iterative reconstruction technique. CONTRAST:  75mL OMNIPAQUE IOHEXOL 350 MG/ML SOLN COMPARISON:  Radiograph earlier today.  Chest CTA 1 week ago FINDINGS: Cardiovascular: The known right lower lobe pulmonary emboli have diminished, with mild residual thrombus in the segmental/subsegmental branches series 5, image 73, extending distally. Overall improved thromboembolic burden from prior exam. No new or progressive pulmonary emboli. Aortic atherosclerosis. The heart is enlarged, particularly the left atrium and ventricle. Pacemaker again seen. Trace pericardial effusion. Mediastinum/Nodes: Unchanged shotty mediastinal and bilateral hilar lymph nodes. Patulous esophagus. No thyroid nodule. Lungs/Pleura: Progressive ground-glass and consolidative opacity in the right lower lobe. Diffuse heterogeneous pulmonary parenchyma with areas of smooth septal thickening. Moderate bronchial thickening. Trace right pleural effusion, increased. Upper Abdomen: No acute findings. Musculoskeletal: No acute findings.  Stable thoracic spondylosis. Review of the MIP images confirms the above findings. IMPRESSION: 1. The known right lower lobe pulmonary emboli have diminished over the last week, with  mild residual thrombus in the segmental/subsegmental branches. Overall improved thromboembolic burden from prior exam. No new or progressive pulmonary emboli. 2. Progressive ground-glass and consolidative opacity in the right lower lobe, favor worsening pneumonia. 3. Diffuse heterogeneous pulmonary parenchyma with areas of smooth septal thickening, query pulmonary edema. Trace right pleural effusion, increased. 4. Cardiomegaly. Aortic Atherosclerosis (ICD10-I70.0). Electronically Signed   By: Chadwick Colonel M.D.   On: 10/07/2023 21:32   DG Chest 2 View Result Date: 10/07/2023 CLINICAL DATA:  Shortness of breath. EXAM: CHEST - 2 VIEW COMPARISON:  Chest radiograph dated 09/30/2023. FINDINGS: Stable cardiomegaly. Stable left chest wall cardiac device in place. Aortic atherosclerosis. Increased bibasilar interstitial opacities, more pronounced on the right. Mild bibasilar atelectasis. No sizable pleural effusion or pneumothorax. No acute osseous abnormality. IMPRESSION: 1. Increased bibasilar interstitial opacities, more pronounced on the right, could reflect asymmetric pulmonary edema or infectious/inflammatory process. 2. Stable  cardiomegaly. Electronically Signed   By: Hart Robinsons M.D.   On: 10/07/2023 16:12   VAS [place] LOWER EXTREMITY VENOUS (DVT) Result Date: 10/01/2023  Lower Venous DVT Study Patient Name:  ESLY SELVAGE  Date of Exam:   10/01/2023 Medical Rec #: [id number]          Accession #:    [id number] Date of Birth: 10-Apr-1961         Patient Gender: F Patient Age:   13 years Exam Location:  [facility] Procedure:      VAS [place] LOWER EXTREMITY VENOUS (DVT) Referring Phys: Brendia Sacks --------------------------------------------------------------------------------  Indications: Pulmonary embolism.  Comparison Study: LLEV yesterday, negative for DVT, positive for SVT in SSV. Performing Technologist: Ernestene Mention RVT, RDMS  Examination Guidelines: A complete evaluation includes B-mode  imaging, spectral Doppler, color Doppler, and power Doppler as needed of all accessible portions of each vessel. Bilateral testing is considered an integral part of a complete examination. Limited examinations for reoccurring indications may be performed as noted. The reflux portion of the exam is performed with the patient in reverse Trendelenburg.  +---------+---------------+---------+-----------+----------+--------------+ RIGHT    CompressibilityPhasicitySpontaneityPropertiesThrombus Aging +---------+---------------+---------+-----------+----------+--------------+ CFV      Full           Yes      Yes                                 +---------+---------------+---------+-----------+----------+--------------+ SFJ      Full                                                        +---------+---------------+---------+-----------+----------+--------------+ FV Prox  Full           Yes      Yes                                 +---------+---------------+---------+-----------+----------+--------------+ FV Mid   Full           Yes      Yes                                 +---------+---------------+---------+-----------+----------+--------------+ FV DistalFull           Yes      Yes                                 +---------+---------------+---------+-----------+----------+--------------+ PFV      Full                                                        +---------+---------------+---------+-----------+----------+--------------+ POP      Full           Yes      Yes                                 +---------+---------------+---------+-----------+----------+--------------+  PTV      Full                                                        +---------+---------------+---------+-----------+----------+--------------+ PERO     Full                                                        +---------+---------------+---------+-----------+----------+--------------+      Summary: RIGHT: - There is no evidence of deep vein thrombosis in the lower extremity.  - No cystic structure found in the popliteal fossa.   *See table(s) above for measurements and observations. Electronically signed by Lemar Livings MD on 10/01/2023 at 4:46:43 PM.    Final    VAS [place] LOWER EXTREMITY VENOUS (DVT) (ONLY [facility] & [facility]) Result Date: 09/30/2023  Lower Venous DVT Study Patient Name:  STEFANA LODICO  Date of Exam:   09/30/2023 Medical Rec #: [id number]          Accession #:    [id number] Date of Birth: [birth year]         Patient Gender: F Patient Age:   75 years Exam Location:  [facility] Procedure:      VAS [place] LOWER EXTREMITY VENOUS (DVT) Referring Phys: Gloris Manchester --------------------------------------------------------------------------------  Indications: Pain.  Risk Factors: Past pregnancy. Limitations: Poor ultrasound/tissue interface. Comparison Study: None. Performing Technologist: Shona Simpson  Examination Guidelines: A complete evaluation includes B-mode imaging, spectral Doppler, color Doppler, and power Doppler as needed of all accessible portions of each vessel. Bilateral testing is considered an integral part of a complete examination. Limited examinations for reoccurring indications may be performed as noted. The reflux portion of the exam is performed with the patient in reverse Trendelenburg.  +-----+---------------+---------+-----------+----------+--------------+ RIGHTCompressibilityPhasicitySpontaneityPropertiesThrombus Aging +-----+---------------+---------+-----------+----------+--------------+ CFV  Full           Yes      Yes                                 +-----+---------------+---------+-----------+----------+--------------+   +--------+---------------+---------+-----------+----------------+-------------+ LEFT    CompressibilityPhasicitySpontaneityProperties      Thrombus                                                                 Aging          +--------+---------------+---------+-----------+----------------+-------------+ CFV     Full           Yes      Yes                                      +--------+---------------+---------+-----------+----------------+-------------+ SFJ     Full                                                             +--------+---------------+---------+-----------+----------------+-------------+  FV Prox Full                                                             +--------+---------------+---------+-----------+----------------+-------------+ FV Mid  Full                                                             +--------+---------------+---------+-----------+----------------+-------------+ FV      Full                    Yes                                      Distal                                                                   +--------+---------------+---------+-----------+----------------+-------------+ PFV     Full                                                             +--------+---------------+---------+-----------+----------------+-------------+ POP     Full           Yes      Yes                                      +--------+---------------+---------+-----------+----------------+-------------+ PTV     Full                                                             +--------+---------------+---------+-----------+----------------+-------------+ PERO    Full                                                             +--------+---------------+---------+-----------+----------------+-------------+ SSV     None           No       No         brightly        Acute  echogenic                     +--------+---------------+---------+-----------+----------------+-------------+     Summary: RIGHT: - No evidence of common femoral vein obstruction.   LEFT: - Findings consistent with  acute superficial vein thrombosis involving the left small saphenous vein.  - No cystic structure found in the popliteal fossa.  *See table(s) above for measurements and observations. Electronically signed by Sherald Hess MD on 09/30/2023 at 5:42:49 PM.    Final    CT Angio Chest PE W and/or Wo Contrast Result Date: 09/30/2023 CLINICAL DATA:  Productive cough, lower extremity edema, short of breath for 2 days EXAM: CT ANGIOGRAPHY CHEST WITH CONTRAST TECHNIQUE: Multidetector CT imaging of the chest was performed using the standard protocol during bolus administration of intravenous contrast. Multiplanar CT image reconstructions and MIPs were obtained to evaluate the vascular anatomy. RADIATION DOSE REDUCTION: This exam was performed according to the departmental dose-optimization program which includes automated exposure control, adjustment of the mA and/or kV according to patient size and/or use of iterative reconstruction technique. CONTRAST:  75mL OMNIPAQUE IOHEXOL 350 MG/ML SOLN COMPARISON:  09/12/2023, 09/30/2023 FINDINGS: Cardiovascular: This is a technically adequate evaluation of the pulmonary vasculature. There are segmental right lower lobe pulmonary emboli identified, new since prior study. Clot burden is minimal. No evidence of right heart strain. Stable cardiomegaly with prominent left ventricular dilatation. Multi lead pacemaker unchanged. Normal caliber of the thoracic aorta. Stable atherosclerosis of the aorta and coronary vasculature. Mediastinum/Nodes: Stable borderline enlarged mediastinal and hilar lymph nodes, likely reactive. Thyroid, trachea, and esophagus are unremarkable. Lungs/Pleura: Since the prior CT, there has been significant improvement in the bilateral patchy airspace disease, with residual faint bilateral ground-glass airspace disease remaining most pronounced in the right lower lobe. Linear areas of consolidation at the left lung base likely reflect subsegmental atelectasis  or scarring, and are stable. No effusion or pneumothorax. The central airways are patent. Upper Abdomen: No acute abnormality. Musculoskeletal: No acute or destructive bony abnormalities. Reconstructed images demonstrate no additional findings. Review of the MIP images confirms the above findings. IMPRESSION: 1. Right lower lobe segmental pulmonary emboli, new since prior study. Minimal clot burden with no evidence of right heart strain. 2. Marked improvement in the bilateral airspace disease seen previously, with residual faint ground-glass opacities most pronounced in the right lower lobe compatible with resolving pneumonia. 3. Stable borderline enlarged mediastinal and hilar lymph nodes, consistent with reactive adenopathy. 4. Stable cardiomegaly. 5. Aortic Atherosclerosis (ICD10-I70.0). Coronary artery atherosclerosis. Critical Value/emergent results were called by telephone at the time of interpretation on 09/30/2023 at 4:59 p.m. to provider Gloris Manchester , who verbally acknowledged these results. Electronically Signed   By: Sharlet Salina M.D.   On: 09/30/2023 17:02   DG Chest 2 View Result Date: 09/30/2023 CLINICAL DATA:  Shortness of breath. EXAM: CHEST - 2 VIEW COMPARISON:  Chest radiograph dated 09/12/2023. FINDINGS: Stable cardiomegaly. Stable left chest wall cardiac device in place. Aortic atherosclerosis. Improved bilateral multifocal airspace disease with residual airspace opacities in the right mid and lower lung zones and lingula. No pleural effusion or pneumothorax. No acute osseous abnormality. IMPRESSION: Improved bilateral multifocal airspace disease, with residual airspace opacities in the right mid and lower lung zones and lingula. These findings could reflect pulmonary edema or pneumonia. Electronically Signed   By: Hart Robinsons M.D.   On: 09/30/2023 15:23     Subjective: No acute issues or events overnight   Discharge Exam: Vitals:  10/15/23 0100 10/15/23 0557  BP: 104/63 96/69   Pulse: 65 66  Resp: 18 18  Temp: 98.1 F (36.7 C) 97.6 F (36.4 C)  SpO2: 99% 96%   Vitals:   10/14/23 1600 10/14/23 2000 10/15/23 0100 10/15/23 0557  BP: 96/65 101/67 104/63 96/69  Pulse: 68 66 65 66  Resp: 18 18 18 18   Temp: 97.6 F (36.4 C) 98.1 F (36.7 C) 98.1 F (36.7 C) 97.6 F (36.4 C)  TempSrc: Oral Oral Oral Axillary  SpO2: 98% 97% 99% 96%  Weight:    83.1 kg  Height:        General: Pt is alert, awake, not in acute distress Cardiovascular: RRR, S1/S2 +, no rubs, no gallops Respiratory: CTA bilaterally, no wheezing, no rhonchi Abdominal: Soft, NT, ND, bowel sounds + Extremities: no edema, no cyanosis    The results of significant diagnostics from this hospitalization (including imaging, microbiology, ancillary and laboratory) are listed below for reference.     Microbiology: Recent Results (from the past 240 hours)  Respiratory (~20 pathogens) panel by PCR     Status: None   Collection Time: 10/08/23  3:46 PM   Specimen: Nasopharyngeal Swab; Respiratory  Result Value Ref Range Status   Adenovirus NOT DETECTED NOT DETECTED Final   Coronavirus 229E NOT DETECTED NOT DETECTED Final    Comment: (NOTE) The Coronavirus on the Respiratory Panel, DOES NOT test for the novel  Coronavirus (2019 nCoV)    Coronavirus HKU1 NOT DETECTED NOT DETECTED Final   Coronavirus NL63 NOT DETECTED NOT DETECTED Final   Coronavirus OC43 NOT DETECTED NOT DETECTED Final   Metapneumovirus NOT DETECTED NOT DETECTED Final   Rhinovirus / Enterovirus NOT DETECTED NOT DETECTED Final   Influenza A NOT DETECTED NOT DETECTED Final   Influenza B NOT DETECTED NOT DETECTED Final   Parainfluenza Virus 1 NOT DETECTED NOT DETECTED Final   Parainfluenza Virus 2 NOT DETECTED NOT DETECTED Final   Parainfluenza Virus 3 NOT DETECTED NOT DETECTED Final   Parainfluenza Virus 4 NOT DETECTED NOT DETECTED Final   Respiratory Syncytial Virus NOT DETECTED NOT DETECTED Final   Bordetella pertussis NOT  DETECTED NOT DETECTED Final   Bordetella Parapertussis NOT DETECTED NOT DETECTED Final   Chlamydophila pneumoniae NOT DETECTED NOT DETECTED Final   Mycoplasma pneumoniae NOT DETECTED NOT DETECTED Final    Comment: Performed at [facility], [address] [place], [place] [address]     Labs: BNP (last 3 results) Recent Labs    10/02/23 1123 10/07/23 1442 10/10/23 0222  BNP 473.8* 916.5* 373.5*   Basic Metabolic Panel: Recent Labs  Lab 10/09/23 0224 10/10/23 0222 10/11/23 0301 10/11/23 0806 10/12/23 0404 10/13/23 0243 10/14/23 0338 10/15/23 1116  NA 138   < > 140 142  142 137 136 137 137  K 3.6   < > 4.4 4.3  4.3 4.2 4.6 4.3 4.1  CL 102   < > 107  --  104 104 104 103  CO2 24   < > 24  --  23 22 22 24   GLUCOSE 106*   < > 107*  --  113* 123* 106* 105*  BUN 18   < > 25*  --  29* 42* 44* 42*  CREATININE 1.03*   < > 1.01*  --  1.10* 1.16* 1.16* 1.07*  CALCIUM 8.6*   < > 9.0  --  9.4 9.9 9.6 9.9  MG 2.0   < > 2.3  --  2.5* 2.6*  2.4 2.3  PHOS 3.9  --   --   --   --   --   --   --    < > = values in this interval not displayed.    CBC: Recent Labs  Lab 10/11/23 0806 10/13/23 0243  WBC  --  4.7  HGB 10.5*  10.5* 11.1*  HCT 31.0*  31.0* 34.2*  MCV  --  87.2  PLT  --  247   CBG: Recent Labs  Lab 10/14/23 1124 10/14/23 1601 10/14/23 2110 10/15/23 0555 10/15/23 1131  GLUCAP 124* 112* 123* 95 102*   Urinalysis    Component Value Date/Time   COLORURINE YELLOW 09/12/2023 1633   APPEARANCEUR CLEAR 09/12/2023 1633   LABSPEC 1.034 (H) 09/12/2023 1633   PHURINE 5.0 09/12/2023 1633   GLUCOSEU NEGATIVE 09/12/2023 1633   HGBUR NEGATIVE 09/12/2023 1633   BILIRUBINUR NEGATIVE 09/12/2023 1633   BILIRUBINUR negative 12/07/2015 1226   KETONESUR NEGATIVE 09/12/2023 1633   PROTEINUR NEGATIVE 09/12/2023 1633   UROBILINOGEN 0.2 12/07/2015 1226   NITRITE NEGATIVE 09/12/2023 1633   LEUKOCYTESUR SMALL (A) 09/12/2023 1633   Sepsis Labs Recent Labs  Lab  10/13/23 0243  WBC 4.7   Microbiology Recent Results (from the past 240 hours)  Respiratory (~20 pathogens) panel by PCR     Status: None   Collection Time: 10/08/23  3:46 PM   Specimen: Nasopharyngeal Swab; Respiratory  Result Value Ref Range Status   Adenovirus NOT DETECTED NOT DETECTED Final   Coronavirus 229E NOT DETECTED NOT DETECTED Final    Comment: (NOTE) The Coronavirus on the Respiratory Panel, DOES NOT test for the novel  Coronavirus (2019 nCoV)    Coronavirus HKU1 NOT DETECTED NOT DETECTED Final   Coronavirus NL63 NOT DETECTED NOT DETECTED Final   Coronavirus OC43 NOT DETECTED NOT DETECTED Final   Metapneumovirus NOT DETECTED NOT DETECTED Final   Rhinovirus / Enterovirus NOT DETECTED NOT DETECTED Final   Influenza A NOT DETECTED NOT DETECTED Final   Influenza B NOT DETECTED NOT DETECTED Final   Parainfluenza Virus 1 NOT DETECTED NOT DETECTED Final   Parainfluenza Virus 2 NOT DETECTED NOT DETECTED Final   Parainfluenza Virus 3 NOT DETECTED NOT DETECTED Final   Parainfluenza Virus 4 NOT DETECTED NOT DETECTED Final   Respiratory Syncytial Virus NOT DETECTED NOT DETECTED Final   Bordetella pertussis NOT DETECTED NOT DETECTED Final   Bordetella Parapertussis NOT DETECTED NOT DETECTED Final   Chlamydophila pneumoniae NOT DETECTED NOT DETECTED Final   Mycoplasma pneumoniae NOT DETECTED NOT DETECTED Final    Comment: Performed at [facility], [address] [place], [place] [address]     Time coordinating discharge: Over 30 minutes  SIGNED:   Haydee Lipa, DO [facility] 10/15/2023, 1:29 PM Pager   If 7PM-7AM, please contact night-coverage [email or web address]

## 2023-10-15 NOTE — Progress Notes (Addendum)
 Advanced Heart Failure Rounding Note  Cardiologist: Dr. Julane Ny  Chief Complaint: Heart Failure Subjective:    Weight up overnight 3lb. Labs pending this morning. PFTs ordered. Labs not collected this morning.   Tired on exam, just woke up. Feeling okay. No SOB. Able to sleep lying flat.   Objective:    Weight Range: 83.1 kg Body mass index is 23.52 kg/m.   Vital Signs:   Temp:  [97.6 F (36.4 C)-98.6 F (37 C)] 97.6 F (36.4 C) (04/15 0557) Pulse Rate:  [60-73] 66 (04/15 0557) Resp:  [18-20] 18 (04/15 0557) BP: (93-104)/(56-69) 96/69 (04/15 0557) SpO2:  [96 %-99 %] 96 % (04/15 0557) Weight:  [83.1 kg] 83.1 kg (04/15 0557) Last BM Date : 10/14/23 (Per pt)  Weight change: Filed Weights   10/13/23 0601 10/14/23 0442 10/15/23 0557  Weight: 81.2 kg 81.8 kg 83.1 kg   Intake/Output:  Intake/Output Summary (Last 24 hours) at 10/15/2023 1610 Last data filed at 10/15/2023 0600 Gross per 24 hour  Intake 840 ml  Output 300 ml  Net 540 ml    Physical Exam    General: Well appearing. No distress on RA Cardiac: JVP flat. S1 and S2 present. No murmurs or rub. Extremities: Warm and dry.  No peripheral edema.  Neuro: Alert and oriented x3. Affect pleasant. Moves all extremities without difficulty.  Telemetry   AV paced in 60s (personally reviewed)  EKG    No new EKG to review   Labs    CBC Recent Labs    10/13/23 0243  WBC 4.7  HGB 11.1*  HCT 34.2*  MCV 87.2  PLT 247   Basic Metabolic Panel Recent Labs    96/04/54 0243 10/14/23 0338  NA 136 137  K 4.6 4.3  CL 104 104  CO2 22 22  GLUCOSE 123* 106*  BUN 42* 44*  CREATININE 1.16* 1.16*  CALCIUM 9.9 9.6  MG 2.6* 2.4   BNP: BNP (last 3 results) Recent Labs    10/02/23 1123 10/07/23 1442 10/10/23 0222  BNP 473.8* 916.5* 373.5*   Imaging   No results found.  Medications:    Scheduled Medications:  apixaban  5 mg Oral BID   atorvastatin  10 mg Oral q AM   bisoprolol  2.5 mg Oral  Daily   digoxin  125 mcg Oral Daily   feeding supplement  237 mL Oral BID BM   furosemide  40 mg Oral Daily   insulin aspart  0-9 Units Subcutaneous TID WC   midodrine  5 mg Oral TID WC   spironolactone  25 mg Oral Daily   Infusions:  PRN Medications: acetaminophen **OR** acetaminophen, albuterol, benzonatate, melatonin, ondansetron (ZOFRAN) IV, mouth rinse, senna-docusate, traZODone  [name] w/ h/o nephrolithiasis s/p partial right nephrectomy, CVA, recently diagnosed PE and chronic systolic heart failure, LBBB s/p CRT-D non responder w/ most recent EF chronically low at 20-25%, multiple admissions for CHF in the last 3 months, readmitted w/ a/c CHF w/ NYHA Class IIIb symptoms.   Assessment/Plan   1. Acute on Chronic Combined Systolic/Diastolic Heart Failure, NICM - LHC 1/23 with min CAD. cMRI possible myocarditis  LVEF 18% Suggestive of LBBB, RVEF 33% - Echo 4/23: EF 30-35% -> EF improving but still with significant LV dysfunction (suspect resolving myocarditis) - EF dropped again in 10/23. Stable at 20-25% with nl RV - s/p Abbott CRT-D 7/24 for LBBB  - Admitted w/ NYHA Class IIIb-IV symptoms w/ volume overload  -  RHC 10/11/23: RA 7, PA 51/30 (38), PCWP 31, CO/CI(TD) 4.44/2.12 - Weight trending up on PO Lasix. Transition to Torsemide 40 mg. No labs collected this morning, will follow. - GDMT limited by BP. Continue Midodrine 5mg  tid, attempt to wean at follow up - continue spironolactone 25 mg daily  - continue bisoprolol 2.5 mg daily  - continue digoxin 0.125 mg daily  - resume jardiance 10 mg daily - Multiple admits for a/c CHF in last several months + down-titration of GDMT due to hypotension and unintentional wt loss/ signs of cardiac cachectica. Concern she is nearing end-stage HF. Needs w/u for advanced therapies as outpatient, as well as PT/rehab for deconditioning. May be good candidate for LVAD. RV normal. Ok Cr. PFTs pending.   2. ? PNA On empiric  abx per IM, doxycyline, based on CXR/CT, though suspect findings most likely 2/2 CHF. Respiratory panel negative and PCT also normal, < 0.10  - completed Doxy x 5 day course    3. PE - diagnosed previous admit 3/25, small burden RLL. LE venous dopplers + for SVT. No DVT   - on Eliquis. Repeat CT of chest this admit shows interval improvement of PE burden, diminished from initial scan. No new or progressive pulmonary emboli.  - continue Eliquis 5 mg bid    4. Fragility/Decondition - needs conditioning prior to transplant/VAD - needs home health PT at discharge.  Heart failure team will sign off as of 10/15/23  HF Team Medication Recommendations for Home: Apixaban 5 mg bid Bisoprolol 2.5 mg daily Digoxin 0.125 mg daily Jardiance 10 mg daily Midodrine 5 mg tid (attempt to wean at HF f/u) Spriolactone 25 mg daily  Torsemide 40 mg daily  Follow up as an outpatient: [facility] 4/30 at 9:30am  Length of Stay: 8  [place] Tikisha Molinaro, NP  10/15/2023, 7:02 AM  Advanced Heart Failure Team Pager [phone] (M-F; 7a - 5p)  Please contact [facility] for night-coverage after hours (5p -7a ) and weekends on [email or web address]

## 2023-10-15 NOTE — Progress Notes (Signed)
 Reviewed AVS, patient expressed understanding of medications, MD follow up reviewed.   Removed IV, Site clean, dry and intact.  Patient states all belongings brought to the hospital at time of admission are accounted for and packed to take home.  Picked up medications from [facility] pharmacy. Pt transported to entrance A where family member was waiting in vehicle to transport home.

## 2023-10-16 LAB — CUP PACEART REMOTE DEVICE CHECK
Battery Remaining Longevity: 52 mo
Battery Remaining Percentage: 82 %
Battery Voltage: 2.98 V
Brady Statistic AP VP Percent: 4.7 %
Brady Statistic AP VS Percent: 1 %
Brady Statistic AS VP Percent: 94 %
Brady Statistic AS VS Percent: 1 %
Brady Statistic RA Percent Paced: 4.1 %
Date Time Interrogation Session: 20250415162432
HighPow Impedance: 60 Ohm
Implantable Lead Connection Status: 753985
Implantable Lead Connection Status: 753985
Implantable Lead Connection Status: 753985
Implantable Lead Implant Date: 20240715
Implantable Lead Implant Date: 20240715
Implantable Lead Implant Date: 20240715
Implantable Lead Location: 753858
Implantable Lead Location: 753859
Implantable Lead Location: 753860
Implantable Lead Model: 7122
Implantable Pulse Generator Implant Date: 20240715
Lead Channel Impedance Value: 400 Ohm
Lead Channel Impedance Value: 450 Ohm
Lead Channel Impedance Value: 580 Ohm
Lead Channel Pacing Threshold Amplitude: 0.625 V
Lead Channel Pacing Threshold Amplitude: 0.75 V
Lead Channel Pacing Threshold Amplitude: 1.75 V
Lead Channel Pacing Threshold Pulse Width: 0.5 ms
Lead Channel Pacing Threshold Pulse Width: 0.5 ms
Lead Channel Pacing Threshold Pulse Width: 0.8 ms
Lead Channel Sensing Intrinsic Amplitude: 11.9 mV
Lead Channel Sensing Intrinsic Amplitude: 2.9 mV
Lead Channel Setting Pacing Amplitude: 1.75 V
Lead Channel Setting Pacing Amplitude: 2.5 V
Lead Channel Setting Pacing Amplitude: 2.75 V
Lead Channel Setting Pacing Pulse Width: 0.5 ms
Lead Channel Setting Pacing Pulse Width: 0.8 ms
Lead Channel Setting Sensing Sensitivity: 0.5 mV
Pulse Gen Serial Number: 211015851
Zone Setting Status: 755011

## 2023-10-17 ENCOUNTER — Ambulatory Visit: Admitting: Physical Therapy

## 2023-10-21 ENCOUNTER — Ambulatory Visit: Admitting: Physical Therapy

## 2023-10-23 ENCOUNTER — Telehealth (HOSPITAL_COMMUNITY): Payer: Self-pay

## 2023-10-23 ENCOUNTER — Ambulatory Visit: Admitting: Physical Therapy

## 2023-10-23 NOTE — Telephone Encounter (Signed)
 Called to confirm/remind patient of their appointment at the [facility] Failure [facility] on 10/24/2023 2:30.   Appointment:   [x] Confirmed  [] Left mess   [] No answer/No voice mail  [] VM Full/unable to leave message  [] Phone not in service  Patient reminded to bring all medications and/or complete list.  Confirmed patient has transportation. Gave directions, instructed to utilize valet parking.

## 2023-10-23 NOTE — Progress Notes (Incomplete)
 Advanced [facility] Clinic Note   Referring Physician: PCP: Aldine Humphreys, PA PCP-Cardiologist: None   Chief Complaint: f/u for chronic systolic heart failure, fatigue, nausea and vomiting  HPI:  Ms Catania is a 63 y.o.with a history of HTN, GERD, nephrolithiasis, partial right nephrectomy, LBBB, and combined diastolic/systolic HF.      Admitted 1/23 with acute HFrEF. 07/13/21 Echo 20-25%, RV normal, grade II DD , and septal - lateral dyssnchrony. R/L cath w/ Minimal CAD, mildly elevated filling pressures and normal CO. cMRI ? Myocarditis (see impression below). LVEF 18%. Placed on Jardiance  and spiro. Not on bb with acute decompensation. No ARNi with hypotension. Discharged on 07/20/21.    Echo 10/25/21 EF 30-35%   RHC 11/23 RA = 6 RV = 30/10  PA = 35/21 (29) PCW = 20  Fick cardiac output/index = 4.8/2.3 Thermo CO/CI = 5.2/2.4 PVR = 1.9 WU FA sat = 96% PA sat = 62%   CPX 12/23 - submax test. Non diagnostic.  Time 2:45 FVC 2.14 (58% predicted)      FEV1 1.82 (64% predicted)        FEV1/FVC 85%        MVV 65 (59% predicted)  BP rest: 100/74 BP standing: 94/72 BP peak: 92/70  Peak VO2: 8.4 (40.9% predicted peak VO2)  VE/VCO2 slope:  33 Peak RER: 0.85    Underwent Abbott CRT-D on 01/14/23. Got good result with significant QRS narrowing.    Echo 11/24 EF 20-25%    Admitted in 11/24 with stroke like symptoms (aphasia) CTA head a showed a possible small distal ACA occlusion.  CT head was negative for stroke. 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   Admitted 2/25 with PNA and UTI. Entresto  and Jardiance  held but Entresto  was restarted at post hospital f/u 09/03/23.    She was recently readmitted from 3/31-4/2 for acute hypoxic respiratory failure in the setting of new  RLL PE and CHF. CTA of chest showed minimal clot burden. Echo not done. LE venous doppler showed superficial vein thrombosis of the left saphenous  vein but no DVT. She was placed on Eliquis  and diuresed w/ IV Lasix . Entresto  was discontinued due to soft BP.    She presented back to the ED on 4/7 w/ worsening SOB, decreased exercise tolerance, orthopnea, worsening cough and swelling of the lower extremities. CXR showed increased bibasilar interstitial opacitites R>L compared to previous exam, concerning asymmetric pulmonary edema or infectious/inflammatory process. Repeat CT of chest showed interval improvement of PE burden, diminished from initial scan. No new or progressive pulmonary emboli. Study however was also c/w worsening PNA and trace rt pleural effusion. Respiratory panel negative. PCT < 0.10. BNP elevated 916 (up from recent baseline). She was readmitted and placed on doxycyline and IV Lasix . AHF team consulted to assist w/ further management of CHF. She underwent RHC which demonstrated mildly elevated pre capillary filling pressures, severely elevated PCWP, normal CI by FICK but moderately reduced by TD 2.1 L/min/m2. She was diuresed w/ IV Lasix . GDMT limited by hypotension, requiring midodrine . Felt to be nearing end-stage HF but not current candidate for advanced therapies given recent pulmonary embolism as well as significant deconditioning. Would need extensive rehab and ambulation at home to be later considered. She was discharged home on 4/15. D/c wt 182 lb.   She returns today for f/u. Here w/ her daughter. Feels poorly. Fatigued. Tired. + nausea and vomiting. Unable to keep food  down for the last 3 days. Still taking her HF meds/ diuretics up until this morning. Also discovered that Eliquis  is off her med list. She was instructed on discharge AVS to stop the Eliquis  "starter pack". Does not have Eliquis  at home.   BP today 100/68. EKG 84 bpm, V-paced rhythm w/ 1 PVC. Device interrogation shows good thoracic impedence above reference curve suggesting euvolemia. On exam appears dry. Her wt is down 10 lb since d/c at 172 lb today     Hospital RHC 4/25 HEMODYNAMICS: RA:                  7 mmHg (mean) RV:                  49/7 mmHg PA:                  50/31 mmHg (38 mean) PCWP:            31 mmHg (mean)                                      Estimated Fick CO/CI   6.3 L/min, 3 L/min/m2 Thermodilution CO/CI   4.4 L/min, 2.1 L/min/m2                                              TPG                 7  mmHg                                              PVR                 1.1-1.6 Wood Units  PAPi                2.7       IMPRESSION: Mildly elevated pre capillary filling pressures Severely elevated PCWP Normal PVR Normal CI by Fick; moderately reduced by Td    Past Medical History:  Diagnosis Date   Anemia    Arthritis    CHF (congestive heart failure) (HCC) 12/06/2022   EF is 20% to 25 %   COPD (chronic obstructive pulmonary disease) (HCC)    Diabetes mellitus without complication (HCC)    Eczema    GERD (gastroesophageal reflux disease)    History of adenomatous polyp of colon    08-03-2016  tubular adenoma x2 and hyperplastic   History of chronic gastritis 08/03/2016   History of ectopic pregnancy 1988   s/p  left salpingectomy   History of endometriosis    History of kidney stones    History of partial nephrectomy    right pelvis for very large stone   History of pneumonia 07/02/2016   CAP   History of sepsis    07-01-2016  sepsis w/ pyelonephritis, CAP /   12-31-2016  urosepsis w/ kidney stone obstruction   History of uterine leiomyoma    Hx of adenomatous colonic polyps 08/03/2016   2 adenomas, 1 hpp and 1 lost polyp 2018 all < 1 cm    Hyperlipidemia    Hypertension    Left ureteral  stone    Myocardial infarction [facility])    Nephrolithiasis    left obstructive stone and right non-obstructive stone  per CT 12-31-2016   Urgency of urination     Current Outpatient Medications  Medication Sig Dispense Refill   acetaminophen  (TYLENOL ) 500 MG tablet Take 500-1,000 mg by mouth every 6 (six)  hours as needed (for pain.).     apixaban  (ELIQUIS ) 5 MG TABS tablet Take 1 tablet (5 mg total) by mouth 2 (two) times daily. 60 tablet 11   atorvastatin  (LIPITOR) 10 MG tablet Take 10 mg by mouth in the morning.     benzonatate  (TESSALON ) 200 MG capsule Take 1 capsule (200 mg total) by mouth 3 (three) times daily as needed for cough (not relieved with tussionex). 20 capsule 0   Calcium  Carbonate Antacid (TUMS E-X PO) Take 1 tablet by mouth daily.     chlorpheniramine-HYDROcodone  (TUSSIONEX) 10-8 MG/5ML Take 5 mLs by mouth every 12 (twelve) hours as needed for cough. 115 mL 0   digoxin  (LANOXIN ) 0.125 MG tablet Take 1 tablet (125 mcg total) by mouth daily. 90 tablet 3   JARDIANCE  10 MG TABS tablet Take 10 mg by mouth daily.     metFORMIN  (GLUCOPHAGE ) 500 MG tablet Take 1 tablet by mouth 2 (two) times daily with a meal.     midodrine  (PROAMATINE ) 5 MG tablet Take 1 tablet (5 mg total) by mouth 3 (three) times daily with meals. 270 tablet 0   Multiple Vitamins-Minerals (ONE-A-DAY WOMENS PO) Take 1 tablet by mouth daily with breakfast.     ondansetron  (ZOFRAN -ODT) 4 MG disintegrating tablet Take 4 mg by mouth every 8 (eight) hours as needed for nausea or vomiting (dissolve orally).     spironolactone  (ALDACTONE ) 25 MG tablet Take 1 tablet (25 mg total) by mouth daily. 90 tablet 0   [START ON 10/26/2023] torsemide  (DEMADEX ) 20 MG tablet Take 1 tablet (20 mg total) by mouth daily.     No current facility-administered medications for this encounter.    Allergies  Allergen Reactions   Latex Rash and Hives   Bactrim [Sulfamethoxazole -Trimethoprim] Rash   Carvedilol  Itching   Metoprolol  Itching and Rash      Social History   Socioeconomic History   Marital status: Married    Spouse name: Richard Blank   Number of children: 1   Years of education: college   Highest education level: Not on file  Occupational History   Occupation: Product manager  Tobacco Use   Smoking status: Former     Current packs/day: 0.00    Average packs/day: 0.8 packs/day for 5.0 years (3.8 ttl pk-yrs)    Types: Cigarettes    Start date: 01/11/1992    Quit date: 01/10/1997    Years since quitting: 26.8   Smokeless tobacco: Never  Vaping Use   Vaping status: Never Used  Substance and Sexual Activity   Alcohol use: Not Currently    Comment: social use; once a month   Drug use: No   Sexual activity: Yes    Partners: Male    Birth control/protection: Surgical  Other Topics Concern   Not on file  Social History Narrative   Lives with her husband and their daughter and their dog.   Social Drivers of Corporate investment banker Strain: Low Risk  (09/24/2023)   Received from [facility]   Overall Financial Resource Strain (CARDIA)    Difficulty of Paying Living Expenses: Not very hard  Food Insecurity: No Food  Insecurity (10/08/2023)   Hunger Vital Sign    Worried About Running Out of Food in the Last Year: Never true    Ran Out of Food in the Last Year: Never true  Transportation Needs: No Transportation Needs (10/08/2023)   PRAPARE - Administrator, Civil Service (Medical): No    Lack of Transportation (Non-Medical): No  Physical Activity: Unknown (09/24/2023)   Received from [facility]   Exercise Vital Sign    Days of Exercise per Week: 0 days    Minutes of Exercise per Session: Not on file  Stress: No Stress Concern Present (09/24/2023)   Received from [facility] - Occupational Stress Questionnaire    Feeling of Stress : Not at all  Social Connections: Patient Declined (09/30/2023)   Social Connection and Isolation Panel [NHANES]    Frequency of Communication with Friends and Family: Patient declined    Frequency of Social Gatherings with Friends and Family: Patient declined    Attends Religious Services: Patient declined    Database administrator or Organizations: Patient declined    Attends Banker Meetings:  Patient declined    Marital Status: Patient declined  Intimate Partner Violence: Not At Risk (10/08/2023)   Humiliation, Afraid, Rape, and Kick questionnaire    Fear of Current or Ex-Partner: No    Emotionally Abused: No    Physically Abused: No    Sexually Abused: No      Family History  Problem Relation Age of Onset   Sarcoidosis Mother    Prostate cancer Father    Hypertension Sister    Eczema Sister    Lupus Sister    Breast cancer Maternal Aunt    Gout Maternal Grandmother    Diabetes Maternal Grandmother        leg amputations   Stomach cancer Neg Hx    Colon cancer Neg Hx    Esophageal cancer Neg Hx    Rectal cancer Neg Hx     Vitals:   10/24/23 1445  BP: 100/68  Pulse: 80  SpO2: 100%  Weight: 78 kg (172 lb)     PHYSICAL EXAM: General:  fatigued/weak appearing. No respiratory difficulty HEENT: normal Neck: supple. JVD not elevated Carotids 2+ bilat; no bruits. No lymphadenopathy or thyromegaly appreciated. Cor: PMI nondisplaced. Regular rate & rhythm. No rubs, gallops or murmurs. Lungs: clear Abdomen: soft, nontender, nondistended.  Extremities: no cyanosis, clubbing, rash, edema Neuro: alert & oriented x 3, cranial nerves grossly intact. moves all 4 extremities w/o difficulty. Affect pleasant.  ECG: A-V paced 84 bpm, personally reviewed    ASSESSMENT & PLAN:  1. Chronic Combined Systolic/Diastolic Heart Failure, NICM - LHC 1/23 with min CAD. cMRI possible myocarditis  LVEF 18% Suggestive of LBBB, RVEF 33% - Echo 4/23: EF 30-35% -> EF improving but still with significant LV dysfunction (suspect resolving myocarditis) - EF dropped again in 10/23. Stable at 20-25% with nl RV - s/p Abbott CRT-D 7/24 for LBBB  - Recently admitted w/ NYHA Class IIIb-IV symptoms w/ volume overload  - RHC 10/11/23: RA 7, PA 51/30 (38), PCWP 31, CO/CI(TD) 4.44/2.12=>>diuresed w/ IV Lasix . D/c wt 182 lb  - Feels poorly today, mainly fatigue/ nausea and vomiting, poor PO intake.  NYHA III. Wt down 10 since discharge. BP today 100/68. EKG 84 bpm, V-paced rhythm w/ 1 PVC. Device interrogation shows good thoracic impedence above reference curve suggesting euvolemia. On exam, she appears dry.  -  I think she is over-diuresed. Instructed to hold torsemide  today and tomorrow, then plan to start back at lower dose of 20 mg daily. Also concern symptoms are from low output. Will check Lactic acid level and CMP. If suggestive of low output/ shock, will need to go to the ED. I will stop her ? blocker (bisoprolol ) given marginal CI on recent LHC. Also need to r/o digoxin  toxicity. EKG ok. Check Dig level  - Pending results of CMP, particularly SCr/K, she may require dose adjustments to GDMT For now continue theses meds until further directed  - continue spironolactone  25 mg daily  - continue bisoprolol  2.5 mg daily  - continue digoxin  0.125 mg daily - continue jardiance  10 mg daily - no additional GDMT given soft BP and need for midodrine   - She is end-stage HF. Multiple admits for a/c CHF in last several months + down-titration of GDMT due to hypotension and unintentional wt loss/ signs of cardiac cachectica. Needs w/u for advanced therapies as outpatient, as well as PT/rehab for deconditioning. May be good candidate for LVAD if she is able to rehab and increase strength, though pulmonary fx may be another potential barrier. Recent PFTs showed moderately severe restriction (interstitial). We discussed limitations during her recent hospitalization and I explained to her and her daughter again today. She is considering going to [facility] for a second opinion    2. PE - diagnosed 3/25, small burden RLL. LE venous dopplers + for SVT. No DVT   - pt mistook recent discharge instructions. Not taking Eliquis   - instructed to restart Eliquis  5 mg bid (she completed the load/starter pack)    3. HTN - controlled/ low normal  - no up titration to GDMT made today  - check CMP    4. LBBB - LBBB, QRS  176 ms - now s/p Abbott CRT-D 7/24 - no VT on device interrogation   5. Partial R Nephrectomy - b/l SCr 1.1  - Check CMP    6. H/o CVA 11/24 - no significant residual deficits  - on statin per Neuro  - no longer on Plavix . On Eliquis   - No AF on device interrogation.   4. Fragility/Decondition - needs conditioning prior to transplant/VAD - needs home health PT   Arrange close f/u. F/u in 1 wk. If labs suggestive of shock/low output, will advise she go to the ED.    Ruddy Corral, PA-C 10/24/23

## 2023-10-24 ENCOUNTER — Encounter (HOSPITAL_COMMUNITY): Payer: Self-pay

## 2023-10-24 ENCOUNTER — Ambulatory Visit: Admitting: Physical Therapy

## 2023-10-24 ENCOUNTER — Ambulatory Visit (HOSPITAL_COMMUNITY)
Admission: RE | Admit: 2023-10-24 | Discharge: 2023-10-24 | Disposition: A | Discharge status: Home / Self Care | Source: Ambulatory Visit | Attending: Cardiology | Admitting: Cardiology

## 2023-10-24 VITALS — BP 100/68 | HR 80 | Wt 172.0 lb

## 2023-10-24 DIAGNOSIS — Z7901 Long term (current) use of anticoagulants: Secondary | ICD-10-CM | POA: Diagnosis not present

## 2023-10-24 DIAGNOSIS — I5042 Chronic combined systolic (congestive) and diastolic (congestive) heart failure: Secondary | ICD-10-CM

## 2023-10-24 DIAGNOSIS — I447 Left bundle-branch block, unspecified: Secondary | ICD-10-CM | POA: Insufficient documentation

## 2023-10-24 DIAGNOSIS — Z7984 Long term (current) use of oral hypoglycemic drugs: Secondary | ICD-10-CM | POA: Insufficient documentation

## 2023-10-24 DIAGNOSIS — Z86711 Personal history of pulmonary embolism: Secondary | ICD-10-CM | POA: Insufficient documentation

## 2023-10-24 DIAGNOSIS — Z4509 Encounter for adjustment and management of other cardiac device: Secondary | ICD-10-CM | POA: Diagnosis not present

## 2023-10-24 DIAGNOSIS — R112 Nausea with vomiting, unspecified: Secondary | ICD-10-CM | POA: Diagnosis not present

## 2023-10-24 DIAGNOSIS — Z8673 Personal history of transient ischemic attack (TIA), and cerebral infarction without residual deficits: Secondary | ICD-10-CM | POA: Insufficient documentation

## 2023-10-24 DIAGNOSIS — Z95810 Presence of automatic (implantable) cardiac defibrillator: Secondary | ICD-10-CM | POA: Diagnosis not present

## 2023-10-24 DIAGNOSIS — I428 Other cardiomyopathies: Secondary | ICD-10-CM | POA: Diagnosis not present

## 2023-10-24 DIAGNOSIS — I5022 Chronic systolic (congestive) heart failure: Secondary | ICD-10-CM | POA: Insufficient documentation

## 2023-10-24 DIAGNOSIS — I110 Hypertensive heart disease with heart failure: Secondary | ICD-10-CM | POA: Insufficient documentation

## 2023-10-24 DIAGNOSIS — Z87891 Personal history of nicotine dependence: Secondary | ICD-10-CM | POA: Insufficient documentation

## 2023-10-24 DIAGNOSIS — I2510 Atherosclerotic heart disease of native coronary artery without angina pectoris: Secondary | ICD-10-CM | POA: Diagnosis not present

## 2023-10-24 DIAGNOSIS — Z79899 Other long term (current) drug therapy: Secondary | ICD-10-CM | POA: Insufficient documentation

## 2023-10-24 LAB — COMPREHENSIVE METABOLIC PANEL WITH GFR
ALT: 20 U/L (ref 0–44)
AST: 18 U/L (ref 15–41)
Albumin: 4.5 g/dL (ref 3.5–5.0)
Alkaline Phosphatase: 45 U/L (ref 38–126)
Anion gap: 15 (ref 5–15)
BUN: 56 mg/dL — ABNORMAL HIGH (ref 8–23)
CO2: 28 mmol/L (ref 22–32)
Calcium: 10.4 mg/dL — ABNORMAL HIGH (ref 8.9–10.3)
Chloride: 94 mmol/L — ABNORMAL LOW (ref 98–111)
Creatinine, Ser: 3.38 mg/dL — ABNORMAL HIGH (ref 0.44–1.00)
GFR, Estimated: 15 mL/min — ABNORMAL LOW (ref >60)
Glucose, Bld: 102 mg/dL — ABNORMAL HIGH (ref 70–99)
Potassium: 4.1 mmol/L (ref 3.5–5.1)
Sodium: 137 mmol/L (ref 135–145)
Total Bilirubin: 0.9 mg/dL (ref 0.0–1.2)
Total Protein: 8 g/dL (ref 6.5–8.1)

## 2023-10-24 LAB — LACTIC ACID, PLASMA: Lactic Acid, Venous: 1 mmol/L (ref 0.5–1.9)

## 2023-10-24 LAB — DIGOXIN LEVEL: Digoxin Level: 1.2 ng/mL (ref 0.8–2.0)

## 2023-10-24 MED ORDER — TORSEMIDE 20 MG PO TABS: 20.0000 mg | ORAL_TABLET | Freq: Every day | ORAL | Status: DC

## 2023-10-24 MED ORDER — APIXABAN 5 MG PO TABS: 5.0000 mg | ORAL_TABLET | Freq: Two times a day (BID) | ORAL | 11 refills | Status: AC

## 2023-10-24 NOTE — Patient Instructions (Addendum)
 Great to see you today!!!  Medication Changes:  HOLD Torsemide  today and tomorrow, RESTART on Saturday 10/26/23 at 20 mg (1 tab) Daily  START Eliquis  5 mg Twice daily   Lab Work:  Labs done today, your results will be available in MyChart, we will contact you for abnormal readings.  Special Instructions // Education:  Do the following things EVERYDAY: Weigh yourself in the morning before breakfast. Write it down and keep it in a log. Take your medicines as prescribed Eat low salt foods--Limit salt (sodium) to 2000 mg per day.  Stay as active as you can everyday Limit all fluids for the day to less than 2 liters   Follow-Up in: 1 week with Dr Julane Ny   At the [facility], you and your health needs are our priority. We have a designated team specialized in the treatment of Heart Failure. This Care Team includes your primary Heart Failure Specialized Cardiologist (physician), Advanced Practice Providers (APPs- Physician Assistants and Nurse Practitioners), and Pharmacist who all work together to provide you with the care you need, when you need it.   You may see any of the following providers on your designated Care Team at your next follow up:  Dr. Jules Oar Dr. Peder Bourdon Dr. Alwin Baars Dr. Judyth Nunnery Nieves Bars, NP Ruddy Corral, [place] [facility], [place] Dennise Fitz, NP [place] Lee, NP Luster Salters, PharmD   Please be sure to bring in all your medications bottles to every appointment.   Need to Contact Us :  If you have any questions or concerns before your next appointment please send us  a message through [place] or call our office at [phone].    TO LEAVE A MESSAGE FOR THE NURSE SELECT OPTION 2, PLEASE LEAVE A MESSAGE INCLUDING: YOUR NAME DATE OF BIRTH CALL BACK NUMBER REASON FOR CALL**this is important as we prioritize the call backs  YOU WILL RECEIVE A CALL BACK THE SAME DAY AS LONG AS YOU CALL BEFORE 4:00  PM

## 2023-10-28 ENCOUNTER — Telehealth (HOSPITAL_COMMUNITY): Payer: Self-pay | Admitting: Internal Medicine

## 2023-10-28 NOTE — Telephone Encounter (Signed)
 Called to confirm/remind patient of their appointment at the [facility] on 10/28/2023.   Appointment:   [x] Confirmed  [] Left mess   [] No answer/No voice mail  [] VM Full/unable to leave message  [] Phone not in service  Patient reminded to bring all medications and/or complete list.  Confirmed patient has transportation. Gave directions, instructed to utilize valet parking.

## 2023-10-29 ENCOUNTER — Ambulatory Visit (HOSPITAL_COMMUNITY)
Admission: RE | Admit: 2023-10-29 | Discharge: 2023-10-29 | Disposition: A | Discharge status: Home / Self Care | Source: Ambulatory Visit | Attending: Internal Medicine | Admitting: Internal Medicine

## 2023-10-29 ENCOUNTER — Encounter (HOSPITAL_COMMUNITY): Payer: Self-pay | Admitting: Internal Medicine

## 2023-10-29 ENCOUNTER — Encounter: Payer: Self-pay | Admitting: Cardiovascular Disease

## 2023-10-29 VITALS — BP 104/70 | HR 79 | Wt 184.8 lb

## 2023-10-29 DIAGNOSIS — I5042 Chronic combined systolic (congestive) and diastolic (congestive) heart failure: Secondary | ICD-10-CM | POA: Insufficient documentation

## 2023-10-29 DIAGNOSIS — I447 Left bundle-branch block, unspecified: Secondary | ICD-10-CM

## 2023-10-29 DIAGNOSIS — N179 Acute kidney failure, unspecified: Secondary | ICD-10-CM | POA: Diagnosis not present

## 2023-10-29 LAB — BASIC METABOLIC PANEL WITH GFR
Anion gap: 9 (ref 5–15)
BUN: 16 mg/dL (ref 8–23)
CO2: 25 mmol/L (ref 22–32)
Calcium: 9 mg/dL (ref 8.9–10.3)
Chloride: 104 mmol/L (ref 98–111)
Creatinine, Ser: 1.07 mg/dL — ABNORMAL HIGH (ref 0.44–1.00)
GFR, Estimated: 59 mL/min — ABNORMAL LOW (ref >60)
Glucose, Bld: 102 mg/dL — ABNORMAL HIGH (ref 70–99)
Potassium: 3.9 mmol/L (ref 3.5–5.1)
Sodium: 138 mmol/L (ref 135–145)

## 2023-10-29 LAB — BRAIN NATRIURETIC PEPTIDE: B Natriuretic Peptide: 639.2 pg/mL — ABNORMAL HIGH (ref 0.0–100.0)

## 2023-10-29 MED ORDER — TORSEMIDE 20 MG PO TABS: 20.0000 mg | ORAL_TABLET | ORAL | Status: DC

## 2023-10-29 MED ORDER — TORSEMIDE 20 MG PO TABS: 20.0000 mg | ORAL_TABLET | ORAL | 3 refills | Status: DC

## 2023-10-29 NOTE — Patient Instructions (Signed)
 Great to see you today!!!  Medication Changes:  Remain off Digoxin  and Spironolactone  at this time  Friday 11/01/23 RESTART Torsemide  20 mg daily on Monday and Fridays  Lab Work:  Labs done today, your results will be available in MyChart, we will contact you for abnormal readings.  Special Instructions // Education:  Do the following things EVERYDAY: Weigh yourself in the morning before breakfast. Write it down and keep it in a log. Take your medicines as prescribed Eat low salt foods--Limit salt (sodium) to 2000 mg per day.  Stay as active as you can everyday Limit all fluids for the day to less than 2 liters   Follow-Up in: 2-3 weeks   At the [facility], you and your health needs are our priority. We have a designated team specialized in the treatment of Heart Failure. This Care Team includes your primary Heart Failure Specialized Cardiologist (physician), Advanced Practice Providers (APPs- Physician Assistants and Nurse Practitioners), and Pharmacist who all work together to provide you with the care you need, when you need it.   You may see any of the following providers on your designated Care Team at your next follow up:  Dr. Jules Oar Dr. Peder Bourdon Dr. Alwin Baars Dr. Judyth Nunnery Nieves Bars, NP Ruddy Corral, [place] [facility], [place] Dennise Fitz, NP [place] Lee, NP Luster Salters, PharmD   Please be sure to bring in all your medications bottles to every appointment.   Need to Contact Us :  If you have any questions or concerns before your next appointment please send us  a message through [place] or call our office at [phone].    TO LEAVE A MESSAGE FOR THE NURSE SELECT OPTION 2, PLEASE LEAVE A MESSAGE INCLUDING: YOUR NAME DATE OF BIRTH CALL BACK NUMBER REASON FOR CALL**this is important as we prioritize the call backs  YOU WILL RECEIVE A CALL BACK THE SAME DAY AS LONG AS YOU CALL BEFORE 4:00 PM

## 2023-10-29 NOTE — Progress Notes (Addendum)
 Advanced Heart Failure Clinic Note   Referring Physician: PCP: Lynn Humphreys, PA PCP-Cardiologist: None   Chief Complaint: f/u for chronic systolic heart failure, fatigue, nausea and vomiting  HPI:  [name] is a 63 y.o.with a history of HTN, GERD, nephrolithiasis, partial right nephrectomy, LBBB, and combined diastolic/systolic HF.      Admitted 1/23 with acute HFrEF. 07/13/21 Echo 20-25%, RV normal, grade II DD , and septal - lateral dyssnchrony. R/L cath w/ Minimal CAD, mildly elevated filling pressures and normal CO. cMRI ? Myocarditis (see impression below). LVEF 18%.    Echo 10/25/21 EF 30-35%   RHC 11/23 CI 2.3   CPX 12/23 - submax test. Non diagnostic.  Time 2:45 FVC 2.14 (58% predicted)      FEV1 1.82 (64% predicted)        FEV1/FVC 85%        MVV 65 (59% predicted)  BP rest: 100/74 BP standing: 94/72 BP peak: 92/70  Peak VO2: 8.4 (40.9% predicted peak VO2)  VE/VCO2 slope:  33 Peak RER: 0.85    Underwent Abbott CRT-D on 01/14/23. Got good result with significant QRS narrowing.    Echo 11/24 EF 20-25%    Admitted in 11/24 with stroke like symptoms (aphasia) CTA head a showed a possible small distal ACA occlusion.  CT head was negative for stroke. 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  Echo 2/25 EF 20-25% RV ok    She was admitted from 3/31-10/02/23 for acute hypoxic respiratory failure in the setting of new  RLL PE and CHF. CTA of chest showed minimal clot burden. Echo not done. LE venous doppler showed superficial vein thrombosis of the left saphenous vein but no DVT. She was placed on Eliquis  and diuresed w/ IV Lasix . Entresto  was discontinued due to soft BP.    She presented back to the ED on 4/7 w/ worsening HF. Repeat CT of chest showed interval improvement of PE burden, diminished from initial scan. She was also c/w worsening PNA and trace rt pleural effusion. Respiratory panel negative. PCT <  0.10. BNP elevated 916 (up from recent baseline). She was readmitted and placed on doxycyline and IV Lasix . AHF team consulted to assist w/ further management of CHF. She underwent RHC which demonstrated mildly elevated pre capillary filling pressures, severely elevated PCWP, normal CI by FICK but moderately reduced by TD 2.1 L/min/m2. She was diuresed w/ IV Lasix . GDMT limited by hypotension, requiring midodrine . Felt to be nearing end-stage HF but not current candidate for advanced therapies given recent pulmonary embolism as well as significant deconditioning. Would need extensive rehab and ambulation at home to be later considered. She was discharged home on 4/15. D/c wt 182 lb.   Seen in HF Clinic 10/24/23. Very weak. Felt to be overdiuresed. Diuretics held and bisoprolol  stopped. Scr 1.07 -> 3.38 LA 1.0 Told to come to the ER but she took herself to [facility]. Felt to be very volume depleted in setting of possible viral gastroenteritis. Given copious IVF.  Scr 1.4  spiro, digoxin  and torsemide  were held. Placed on midodrine  for BP support.   Here for f/u. Feeling better. Able to do all ADLs without too much problem. No edema, orthopnea or PND. Very worried about getting volume depleted again.   Hospital RHC 10/11/23 HEMODYNAMICS: RA:                  7 mmHg (mean) RV:  49/7 mmHg PA:                  50/31 mmHg (38 mean) PCWP:            31 mmHg (mean)                                      Estimated Fick CO/CI   6.3 L/min, 3 L/min/m2 Thermodilution CO/CI   4.4 L/min, 2.1 L/min/m2                                              TPG                 7  mmHg                                              PVR                 1.1-1.6 Wood Units  PAPi                2.7       Past Medical History:  Diagnosis Date   Anemia    Arthritis    CHF (congestive heart failure) (HCC) 12/06/2022   EF is 20% to 25 %   COPD (chronic obstructive pulmonary disease) (HCC)    Diabetes mellitus without  complication (HCC)    Eczema    GERD (gastroesophageal reflux disease)    History of adenomatous polyp of colon    08-03-2016  tubular adenoma x2 and hyperplastic   History of chronic gastritis 08/03/2016   History of ectopic pregnancy 1988   s/p  left salpingectomy   History of endometriosis    History of kidney stones    History of partial nephrectomy    right pelvis for very large stone   History of pneumonia 07/02/2016   CAP   History of sepsis    07-01-2016  sepsis w/ pyelonephritis, CAP /   12-31-2016  urosepsis w/ kidney stone obstruction   History of uterine leiomyoma    Hx of adenomatous colonic polyps 08/03/2016   2 adenomas, 1 hpp and 1 lost polyp 2018 all < 1 cm    Hyperlipidemia    Hypertension    Left ureteral stone    Myocardial infarction [facility])    Nephrolithiasis    left obstructive stone and right non-obstructive stone  per CT 12-31-2016   Urgency of urination     Current Outpatient Medications  Medication Sig Dispense Refill   acetaminophen  (TYLENOL ) 500 MG tablet Take 500-1,000 mg by mouth every 6 (six) hours as needed (for pain.).     apixaban  (ELIQUIS ) 5 MG TABS tablet Take 1 tablet (5 mg total) by mouth 2 (two) times daily. 60 tablet 11   atorvastatin  (LIPITOR) 10 MG tablet Take 10 mg by mouth in the morning.     benzonatate  (TESSALON ) 200 MG capsule Take 1 capsule (200 mg total) by mouth 3 (three) times daily as needed for cough (not relieved with tussionex). 20 capsule 0   Calcium  Carbonate Antacid (TUMS E-X PO) Take 1 tablet by mouth daily.  clopidogrel  (PLAVIX ) 75 MG tablet Take 75 mg by mouth daily.     JARDIANCE  10 MG TABS tablet Take 10 mg by mouth daily.     metFORMIN  (GLUCOPHAGE ) 500 MG tablet Take 1 tablet by mouth 2 (two) times daily with a meal.     midodrine  (PROAMATINE ) 5 MG tablet Take 1 tablet (5 mg total) by mouth 3 (three) times daily with meals. 270 tablet 0   Multiple Vitamins-Minerals (ONE-A-DAY WOMENS PO) Take 1 tablet by mouth  daily with breakfast.     ondansetron  (ZOFRAN -ODT) 4 MG disintegrating tablet Take 4 mg by mouth every 8 (eight) hours as needed for nausea or vomiting (dissolve orally).     chlorpheniramine-HYDROcodone  (TUSSIONEX) 10-8 MG/5ML Take 5 mLs by mouth every 12 (twelve) hours as needed for cough. (Patient not taking: Reported on 10/29/2023) 115 mL 0   digoxin  (LANOXIN ) 0.125 MG tablet Take 1 tablet (125 mcg total) by mouth daily. (Patient not taking: Reported on 10/29/2023) 90 tablet 3   spironolactone  (ALDACTONE ) 25 MG tablet Take 1 tablet (25 mg total) by mouth daily. (Patient not taking: Reported on 10/29/2023) 90 tablet 0   torsemide  (DEMADEX ) 20 MG tablet Take 1 tablet (20 mg total) by mouth daily. (Patient not taking: Reported on 10/29/2023)     No current facility-administered medications for this encounter.    Allergies  Allergen Reactions   Latex Rash and Hives   Bactrim [Sulfamethoxazole -Trimethoprim] Rash   Carvedilol  Itching   Metoprolol  Itching and Rash      Social History   Socioeconomic History   Marital status: Married    Spouse name: Richard Blahnik   Number of children: 1   Years of education: college   Highest education level: Not on file  Occupational History   Occupation: Product manager  Tobacco Use   Smoking status: Former    Current packs/day: 0.00    Average packs/day: 0.8 packs/day for 5.0 years (3.8 ttl pk-yrs)    Types: Cigarettes    Start date: 01/11/1992    Quit date: 01/10/1997    Years since quitting: 26.8   Smokeless tobacco: Never  Vaping Use   Vaping status: Never Used  Substance and Sexual Activity   Alcohol use: Not Currently    Comment: social use; once a month   Drug use: No   Sexual activity: Yes    Partners: Male    Birth control/protection: Surgical  Other Topics Concern   Not on file  Social History Narrative   Lives with her husband and their daughter and their dog.   Social Drivers of Corporate investment banker Strain: Low  Risk  (10/26/2023)   Received from [facility]   Overall Financial Resource Strain (CARDIA)    Difficulty of Paying Living Expenses: Not hard at all  Food Insecurity: No Food Insecurity (10/26/2023)   Received from [facility]   Hunger Vital Sign    Worried About Running Out of Food in the Last Year: Never true    Ran Out of Food in the Last Year: Never true  Transportation Needs: No Transportation Needs (10/26/2023)   Received from [facility] - Transportation    In the past 12 months, has lack of transportation kept you from medical appointments or from getting medications?: No    Lack of Transportation (Non-Medical): No  Physical Activity: Unknown (09/24/2023)   Received from [facility]   Exercise Vital Sign    Days  of Exercise per Week: 0 days    Minutes of Exercise per Session: Not on file  Stress: No Stress Concern Present (09/24/2023)   Received from [facility] - Occupational Stress Questionnaire    Feeling of Stress : Not at all  Social Connections: Patient Declined (09/30/2023)   Social Connection and Isolation Panel [NHANES]    Frequency of Communication with Friends and Family: Patient declined    Frequency of Social Gatherings with Friends and Family: Patient declined    Attends Religious Services: Patient declined    Database administrator or Organizations: Patient declined    Attends Banker Meetings: Patient declined    Marital Status: Patient declined  Intimate Partner Violence: Not At Risk (10/08/2023)   Humiliation, Afraid, Rape, and Kick questionnaire    Fear of Current or Ex-Partner: No    Emotionally Abused: No    Physically Abused: No    Sexually Abused: No      Family History  Problem Relation Age of Onset   Sarcoidosis Mother    Prostate cancer Father    Hypertension Sister    Eczema Sister    Lupus Sister    Breast cancer  Maternal Aunt    Gout Maternal Grandmother    Diabetes Maternal Grandmother        leg amputations   Stomach cancer Neg Hx    Colon cancer Neg Hx    Esophageal cancer Neg Hx    Rectal cancer Neg Hx     Vitals:   10/29/23 0948  BP: 104/70  Pulse: 79  SpO2: 99%  Weight: 83.8 kg (184 lb 12.8 oz)     PHYSICAL EXAM: General:  Sitting on exam table. No resp difficulty HEENT: normal Neck: supple. no JVD. Carotids 2+ bilat; no bruits. No lymphadenopathy or thryomegaly appreciated. Cor: PMI nondisplaced. Regular rate & rhythm. No rubs, gallops or murmurs. Lungs: clear Abdomen: soft, nontender, nondistended. No hepatosplenomegaly. No bruits or masses. Good bowel sounds. Extremities: no cyanosis, clubbing, rash, edema Neuro: alert & orientedx3, cranial nerves grossly intact. moves all 4 extremities w/o difficulty. Affect pleasant   ASSESSMENT & PLAN:  1. Chronic Combined Systolic/Diastolic Heart Failure, NICM - LHC 1/23 with min CAD. cMRI possible myocarditis  LVEF 18% Suggestive of LBBB, RVEF 33% - Echo 4/23: EF 30-35% -> EF improving but still with significant LV dysfunction (suspect resolving myocarditis) - EF dropped again in 10/23. Stable at 20-25% with nl RV - s/p Abbott CRT-D 7/24 for LBBB  - Echo 2/25 EF 20-25% RV ok  - Recently admitted w/ NYHA Class IIIb-IV symptoms w/ volume overload  - RHC 10/11/23: RA 7, PA 51/30 (38), PCWP 31, CO/CI(TD) 4.44/2.12=>>diuresed w/ IV Lasix . D/c wt 182 lb  - Admitted 3/25 and 4/25 with ADHF - Admitted to [facility] this week with AK due to overdiuresis. Hydrated and GDMT held - Improved NYHA III. Volume ok - On Jardiance  10 - Off spiro and Entresto  and digoxin  - Torsemide  on hold - Volume going back up  ReDS 38% - Will resume torsemide  20 M/F. Can increase as needed - Ongoing concern for end-stage HF - Follow closely and attempt to restart GDMT as tolerated - Can consider CPX in near future to assess for advanced therapies - Labs today  2.  PE - diagnosed 3/25, small burden RLL. LE venous dopplers + for SVT. No DVT   - Continue Eliquis . No bleeding   3. HTN - controlled/ low  normal  - no up titration to GDMT made today  - check CMP    4. LBBB - LBBB, QRS 176 ms - now s/p Abbott CRT-D 7/24 - ICD interrogation today stable  5. Partial R Nephrectomy with recent AKI due to overdiuresis - b/l SCr 1.1  - Labs today   6. H/o CVA 11/24 - no significant residual deficits  - on statin per Neuro  - no longer on Plavix . On Eliquis   - No AF on device interrogation.    Jules Oar, MD 10/29/23

## 2023-10-29 NOTE — Progress Notes (Signed)
 ReDS Vest / Clip - 10/29/23 0948       ReDS Vest / Clip   Station Marker C    Ruler Value 26    ReDS Value Range Moderate volume overload    ReDS Actual Value 38

## 2023-10-30 ENCOUNTER — Encounter (HOSPITAL_COMMUNITY)

## 2023-11-05 ENCOUNTER — Encounter: Payer: Self-pay | Admitting: Cardiology

## 2023-11-05 ENCOUNTER — Encounter (HOSPITAL_COMMUNITY)

## 2023-11-05 ENCOUNTER — Ambulatory Visit (HOSPITAL_COMMUNITY)
Admission: RE | Admit: 2023-11-05 | Discharge: 2023-11-05 | Disposition: A | Discharge status: Home / Self Care | Source: Ambulatory Visit | Attending: Internal Medicine | Admitting: Internal Medicine

## 2023-11-05 DIAGNOSIS — I3139 Other pericardial effusion (noninflammatory): Secondary | ICD-10-CM | POA: Diagnosis not present

## 2023-11-05 DIAGNOSIS — I080 Rheumatic disorders of both mitral and aortic valves: Secondary | ICD-10-CM | POA: Diagnosis not present

## 2023-11-05 DIAGNOSIS — I5042 Chronic combined systolic (congestive) and diastolic (congestive) heart failure: Secondary | ICD-10-CM | POA: Insufficient documentation

## 2023-11-05 LAB — ECHOCARDIOGRAM COMPLETE
AR max vel: 2.22 cm2
AV Peak grad: 13.1 mmHg
Ao pk vel: 1.81 m/s
Area-P 1/2: 4.86 cm2
Calc EF: 18.2 %
Est EF: 20
MV VTI: 2.84 cm2
S' Lateral: 6.6 cm
Single Plane A2C EF: 29.3 %
Single Plane A4C EF: 11.4 %

## 2023-11-07 ENCOUNTER — Other Ambulatory Visit: Payer: Self-pay

## 2023-11-07 ENCOUNTER — Ambulatory Visit: Attending: Internal Medicine

## 2023-11-07 DIAGNOSIS — R2681 Unsteadiness on feet: Secondary | ICD-10-CM | POA: Diagnosis present

## 2023-11-07 DIAGNOSIS — M6281 Muscle weakness (generalized): Secondary | ICD-10-CM | POA: Diagnosis present

## 2023-11-07 DIAGNOSIS — R2689 Other abnormalities of gait and mobility: Secondary | ICD-10-CM | POA: Diagnosis present

## 2023-11-07 NOTE — Therapy (Signed)
 OUTPATIENT PHYSICAL THERAPY EVALUATION   Patient Name: Lynn Estrada MRN: [id number] DOB:1960-08-06, 63 y.o., female Today's Date: 11/07/2023  END OF SESSION:   PT End of Session - 11/07/23 1001     Visit Number 1    Authorization Type [place] MEDICA[employer]    PT Start Time 1005    PT Stop Time 1040    PT Time Calculation (min) 35 min    Activity Tolerance Patient limited by fatigue    Behavior During Therapy WFL for tasks assessed/performed             Past Medical History:  Diagnosis Date   Anemia    Arthritis    CHF (congestive heart failure) (HCC) 12/06/2022   EF is 20% to 25 %   COPD (chronic obstructive pulmonary disease) (HCC)    Diabetes mellitus without complication (HCC)    Eczema    GERD (gastroesophageal reflux disease)    History of adenomatous polyp of colon    08-03-2016  tubular adenoma x2 and hyperplastic   History of chronic gastritis 08/03/2016   History of ectopic pregnancy 1988   s/p  left salpingectomy   History of endometriosis    History of kidney stones    History of partial nephrectomy    right pelvis for very large stone   History of pneumonia 07/02/2016   CAP   History of sepsis    07-01-2016  sepsis w/ pyelonephritis, CAP /   12-31-2016  urosepsis w/ kidney stone obstruction   History of uterine leiomyoma    Hx of adenomatous colonic polyps 08/03/2016   2 adenomas, 1 hpp and 1 lost polyp 2018 all < 1 cm    Hyperlipidemia    Hypertension    Left ureteral stone    Myocardial infarction [facility])    Nephrolithiasis    left obstructive stone and right non-obstructive stone  per CT 12-31-2016   Urgency of urination    Past Surgical History:  Procedure Laterality Date   ABDOMINAL HYSTERECTOMY  01/20/2007   w/ Lysis Adhesions/  Left salpingoophorectomy/  Right Salpingectomy   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   Procedure: BIV ICD INSERTION CRT-D;  Surgeon: Efraim Grange, MD;  Location: [facility];  Service:  Cardiovascular;  Laterality: N/A;   CHOLECYSTECTOMY  1994   and Right Partial Nephrectomy (pelvis for very large stone)   COLONOSCOPY WITH PROPOFOL  N/A 03/26/2023   Procedure: COLONOSCOPY WITH PROPOFOL ;  Surgeon: Kenney Peacemaker, MD;  Location: [facility];  Service: Gastroenterology;  Laterality: N/A;   CYSTOSCOPY W/ URETERAL STENT PLACEMENT Left 12/31/2016   Procedure: CYSTOSCOPY WITH RETROGRADE PYELOGRAM/ LEFT URETERAL STENT PLACEMENT;  Surgeon: Osborn Blaze, MD;  Location: [facility];  Service: Urology;  Laterality: Left;   CYSTOSCOPY WITH RETROGRADE PYELOGRAM, URETEROSCOPY AND STENT PLACEMENT Left 01/18/2017   Procedure: 1ST STAGE CYSTOSCOPY WITH RETROGRADE PYELOGRAM, URETEROSCOPY AND STENT REPLACEMENT;  Surgeon: Osborn Blaze, MD;  Location: [facility];  Service: Urology;  Laterality: Left;   CYSTOSCOPY WITH RETROGRADE PYELOGRAM, URETEROSCOPY AND STENT PLACEMENT Left 02/01/2017   Procedure: 2ND STAGE CYSTOSCOPY WITH RETROGRADE PYELOGRAM, URETEROSCOPY AND STENT REPLACEMENT;  Surgeon: Osborn Blaze, MD;  Location: [facility];  Service: Urology;  Laterality: Left;   ECTOPIC PREGNANCY SURGERY  1988   Left Salpingectomy   HOLMIUM LASER APPLICATION Left 01/18/2017   Procedure: HOLMIUM LASER APPLICATION;  Surgeon: Osborn Blaze, MD;  Location: [facility];  Service: Urology;  Laterality: Left;  HOLMIUM LASER APPLICATION Left 02/01/2017   Procedure: HOLMIUM LASER APPLICATION;  Surgeon: Osborn Blaze, MD;  Location: [facility];  Service: Urology;  Laterality: Left;   LUMBAR DISC SURGERY  01/1999   right L5 -- S1   POLYPECTOMY  03/26/2023   Procedure: POLYPECTOMY;  Surgeon: Kenney Peacemaker, MD;  Location: [facility];  Service: Gastroenterology;;   RE-EXPLORATION LUMBAR/  LAMINECTOMY AND MICRODISKECTOMY  10/14/2001   right L5 -- S1   RIGHT HEART CATH N/A 05/23/2022   Procedure: RIGHT HEART CATH;  Surgeon: Mardell Shade, MD;   Location: [facility];  Service: Cardiovascular;  Laterality: N/A;   RIGHT HEART CATH N/A 10/11/2023   Procedure: RIGHT HEART CATH;  Surgeon: Alwin Baars, DO;  Location: [facility];  Service: Cardiovascular;  Laterality: N/A;   RIGHT/LEFT HEART CATH AND CORONARY ANGIOGRAPHY N/A 07/18/2021   Procedure: RIGHT/LEFT HEART CATH AND CORONARY ANGIOGRAPHY;  Surgeon: Mardell Shade, MD;  Location: [facility];  Service: Cardiovascular;  Laterality: N/A;   TUBAL LIGATION Bilateral 10/17/1999   PPTL   UMBILICAL HERNIA REPAIR  age 89   Patient Active Problem List   Diagnosis Date Noted   SOB (shortness of breath) 10/08/2023   History of pulmonary embolism 10/08/2023   DM2 (diabetes mellitus, type 2) (HCC) 10/08/2023   Anemia of chronic disease 10/08/2023   Aortic atherosclerosis (HCC) 10/01/2023   Atherosclerosis of coronary artery 10/01/2023   Pulmonary emboli (HCC) 09/30/2023   HCAP (healthcare-associated pneumonia) 09/12/2023   Acute on chronic combined systolic and diastolic CHF (congestive heart failure) (HCC) 08/23/2023   Frequency of urination and polyuria 08/23/2023   Cerebrovascular accident (CVA) (HCC) 05/26/2023   Pyuria 05/25/2023   Left-sided weakness 05/24/2023   Epistaxis 04/22/2023   Benign neoplasm of cecum 03/26/2023   Benign neoplasm of descending colon 03/26/2023   Benign neoplasm of sigmoid colon 03/26/2023   Adhesive capsulitis of right shoulder 08/24/2022   Mass of right hand 02/22/2022   LBBB (left bundle branch block) 02/01/2022   Type 2 diabetes mellitus with hyperglycemia, without long-term current use of insulin  (HCC) 01/22/2022   HLD (hyperlipidemia) 01/21/2022   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 10/26/2021   Chronic combined systolic and diastolic heart failure (HCC) 07/27/2021   Acute combined systolic (congestive) and diastolic (congestive) heart failure (HCC)    Hypocalcemia 07/12/2021   Elevated  troponin 07/12/2021   Lipoma of hand 07/20/2020   Osteoarthritis of carpometacarpal (CMC) joint of thumb 07/20/2020   Pain in right hand 07/20/2020   Radial styloid tenosynovitis 07/20/2020   Other osteoarthritis of spine 10/23/2019   Suspected COVID-19 virus infection 06/11/2019   Acute respiratory failure with hypoxia (HCC) 06/11/2019   Multifocal pneumonia 06/11/2019   Carpal tunnel syndrome, bilateral 04/24/2019   Ureteral stone with hydronephrosis 12/31/2016   Hx of adenomatous colonic polyps 08/03/2016   Normocytic anemia 07/10/2016   CAP (community acquired pneumonia) 07/06/2016   Essential hypertension 07/26/2015   Eczema 07/26/2015   Overweight (BMI 25.0-29.9) 07/26/2015    PCP: Aldine Humphreys, PA  REFERRING PROVIDER: Haydee Lipa, MD   REFERRING DIAG:  J18.9 (ICD-10-CM) - Pneumonia of right lower lobe due to infectious organism  R53.1 (ICD-10-CM) - Weakness    THERAPY DIAG:  Other abnormalities of gait and mobility  Muscle weakness (generalized)  Rationale for Evaluation and Treatment: Rehabilitation  ONSET DATE: 10/15/23  SUBJECTIVE:   SUBJECTIVE STATEMENT: Patient reports to PT following several hospital stays with report  of pulmonary embolism. Her last hospital admission was on 10/15/23, including rehab for about 1 month. She states that she has been able to switch from the rollator to her cane since then. She was recently able to walk 20 minutes in the grocery store with the shopping cart, but states that it was completely exhausting.   PERTINENT HISTORY: Relevant PMHx includes Anemia, Arthritis, CHF, COPD, DM, HTN, Myocardial infarction, Pulmonary Emboli, CVA  PAIN:  Are you having pain? No   PRECAUTIONS: Fall; Heart failure (does not tolerate reclined positions); Adhesive capsulitis (?)  RED FLAGS: None   WEIGHT BEARING RESTRICTIONS: No  FALLS:  Has patient fallen in last 6 months? No  LIVING ENVIRONMENT: Lives with: lives with their  spouse Lives in: House/apartment Has following equipment at home: Single point cane and Environmental consultant - 4 wheeled   PLOF: Independent  PATIENT GOALS: "I want to be able to walk long distance (1 miles) at the park without getting as winded/exhausted. I want to be able to do cooking and cleaning without being exhausting."  NEXT MD VISIT: Not scheduled at time of eval   OBJECTIVE:  Note: Objective measures were completed at Evaluation unless otherwise noted.   PATIENT SURVEYS:  Patient-Specific Functional Scale  1. Walking long distance 4/10   2. Cooking a meal 4/10  3. Cleaning (household) 3/10  COGNITION: Overall cognitive status: Within functional limits for tasks assessed      UPPER EXTREMITY MMT:  MMT Right eval Left eval  Shoulder flexion 4- 4-  Shoulder extension    Shoulder abduction 3+ 4-  Shoulder adduction    Shoulder extension    Shoulder internal rotation 4 4  Shoulder external rotation 4 4  Middle trapezius    Lower trapezius    Elbow flexion 4+ 4+  Elbow extension 4+ 4+  Wrist flexion    Wrist extension    Wrist ulnar deviation    Wrist radial deviation    Wrist pronation    Wrist supination    Grip strength     (Blank rows = not tested)   LOWER EXTREMITY MMT:  MMT Right eval Left eval  Hip flexion 4+ 4+  Hip extension    Hip abduction 4+ 4+  Hip adduction 4+ 4+  Hip internal rotation    Hip external rotation    Knee flexion 4+ 4+  Knee extension 4+ 4+  Ankle dorsiflexion    Ankle plantarflexion    Ankle inversion    Ankle eversion     (Blank rows = not tested)  FUNCTIONAL TESTS:  5 times sit to stand: 16 sec  2 minute walk test: 175 ft, stopped after 90 seconds to rest  Gait Speed: 0.78m/s = indicating risk of future falls/hospitalization   GAIT: Distance walked: 175 feet Assistive device utilized: Single point cane Level of assistance: Complete Independence  TREATMENT DATE:   [facility] Adult PT Treatment:                                                DATE: 11/07/2023   Initial evaluation: see patient education and home exercise program as noted below      PATIENT EDUCATION:  Education details: reviewed initial home exercise program; discussion of POC, prognosis and goals for skilled PT   Person educated: Patient Education method: Explanation, Demonstration, and Handouts Education comprehension: verbalized understanding, returned demonstration, and needs further education  HOME EXERCISE PROGRAM: To be provided at f/u visit   ASSESSMENT:  CLINICAL IMPRESSION: Patient is a 63 y.o. female who was seen today for physical therapy evaluation and treatment for general weakness following several hospital admissions. She is demonstrating ***. She has related pain and difficulty with ***. She requires skilled PT services at this time to address relevant deficits and improve overall function.     OBJECTIVE IMPAIRMENTS: {opptimpairments:25111}.   ACTIVITY LIMITATIONS: {activitylimitations:27494}  PARTICIPATION LIMITATIONS: {participationrestrictions:25113}  PERSONAL FACTORS: {Personal factors:25162} are also affecting patient's functional outcome.   REHAB POTENTIAL: {rehabpotential:25112}  CLINICAL DECISION MAKING: {clinical decision making:25114}  EVALUATION COMPLEXITY: {Evaluation complexity:25115}   GOALS: Goals reviewed with patient? YES  SHORT TERM GOALS: Target date: 12/05/2023    Patient will be independent with initial home program at least 3 days/week.  Baseline: provided at eval Goal Status: INITIAL   2.   *** Baseline:  Goal status: INITIAL   LONG TERM GOALS: Target date: 01/03/2024    Patient will report improved overall functional ability with PSFS score of ***.  Baseline:  Goal Status: INITIAL    2.  *** Baseline:  Goal status: INITIAL  3.  *** Baseline:   Goal status: INITIAL  4.  *** Baseline:  Goal status: INITIAL    PLAN:  PT FREQUENCY: 1-2x/week  PT DURATION: 8 weeks  PLANNED INTERVENTIONS: {rehab planned interventions:25118::"97110-Therapeutic exercises","97530- Therapeutic [phone]- Neuromuscular re-education","97535- Self AOZH","08657- Manual therapy"}  PLAN FOR NEXT SESSION: Arlester Bence, PT, DPT  11/08/2023 3:01 PM

## 2023-11-14 ENCOUNTER — Encounter: Payer: Self-pay | Admitting: Physical Therapy

## 2023-11-14 ENCOUNTER — Ambulatory Visit: Admitting: Physical Therapy

## 2023-11-14 ENCOUNTER — Encounter: Payer: Self-pay | Admitting: Internal Medicine

## 2023-11-14 DIAGNOSIS — R2689 Other abnormalities of gait and mobility: Secondary | ICD-10-CM

## 2023-11-14 DIAGNOSIS — M6281 Muscle weakness (generalized): Secondary | ICD-10-CM

## 2023-11-14 NOTE — Therapy (Signed)
 OUTPATIENT PHYSICAL THERAPY EVALUATION   Patient Name: Lynn Estrada MRN: [id number] DOB:[birth year], 62 y.o., female Today's Date: 11/14/2023  END OF SESSION:    PT End of Session - 11/14/23 1059     Visit Number 2    Date for PT Re-Evaluation 11/01/23    Authorization Type  MEDICAID [facility]    Authorization Time Period Approved 12 visits 11/07/23-12/20/23    PT Start Time 1100    PT Stop Time 1141    PT Time Calculation (min) 41 min    Activity Tolerance Patient limited by fatigue    Behavior During Therapy [facility] for tasks assessed/performed             Past Medical History:  Diagnosis Date   Anemia    Arthritis    CHF (congestive heart failure) (HCC) 12/06/2022   EF is 20% to 25 %   COPD (chronic obstructive pulmonary disease) (HCC)    Diabetes mellitus without complication (HCC)    Eczema    GERD (gastroesophageal reflux disease)    History of adenomatous polyp of colon    08-03-2016  tubular adenoma x2 and hyperplastic   History of chronic gastritis 08/03/2016   History of ectopic pregnancy 1988   s/p  left salpingectomy   History of endometriosis    History of kidney stones    History of partial nephrectomy    right pelvis for very large stone   History of pneumonia 07/02/2016   CAP   History of sepsis    07-01-2016  sepsis w/ pyelonephritis, CAP /   12-31-2016  urosepsis w/ kidney stone obstruction   History of uterine leiomyoma    Hx of adenomatous colonic polyps 08/03/2016   2 adenomas, 1 hpp and 1 lost polyp 2018 all < 1 cm    Hyperlipidemia    Hypertension    Left ureteral stone    Myocardial infarction [facility])    Nephrolithiasis    left obstructive stone and right non-obstructive stone  per CT 12-31-2016   Urgency of urination    Past Surgical History:  Procedure Laterality Date   ABDOMINAL HYSTERECTOMY  01/20/2007   w/ Lysis Adhesions/  Left salpingoophorectomy/  Right Salpingectomy   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    Procedure: BIV ICD INSERTION CRT-D;  Surgeon: Efraim Grange, MD;  [facility];  Service: Cardiovascular;  Laterality: N/A;   CHOLECYSTECTOMY  1994   and Right Partial Nephrectomy (pelvis for very large stone)   COLONOSCOPY WITH PROPOFOL  N/A 03/26/2023   Procedure: COLONOSCOPY WITH PROPOFOL ;  Surgeon: Kenney Peacemaker, MD;  Location: [facility];  Service: Gastroenterology;  Laterality: N/A;   CYSTOSCOPY W/ URETERAL STENT PLACEMENT Left 12/31/2016   Procedure: CYSTOSCOPY WITH RETROGRADE PYELOGRAM/ LEFT URETERAL STENT PLACEMENT;  Surgeon: Osborn Blaze, MD;  Location: [facility];  Service: Urology;  Laterality: Left;   CYSTOSCOPY WITH RETROGRADE PYELOGRAM, URETEROSCOPY AND STENT PLACEMENT Left 01/18/2017   Procedure: 1ST STAGE CYSTOSCOPY WITH RETROGRADE PYELOGRAM, URETEROSCOPY AND STENT REPLACEMENT;  Surgeon: Osborn Blaze, MD;  Location: [facility];  Service: Urology;  Laterality: Left;   CYSTOSCOPY WITH RETROGRADE PYELOGRAM, URETEROSCOPY AND STENT PLACEMENT Left 02/01/2017   Procedure: 2ND STAGE CYSTOSCOPY WITH RETROGRADE PYELOGRAM, URETEROSCOPY AND STENT REPLACEMENT;  Surgeon: Osborn Blaze, MD;  Location: [facility];  Service: Urology;  Laterality: Left;   ECTOPIC PREGNANCY SURGERY  1988   Left Salpingectomy   HOLMIUM LASER APPLICATION Left 01/18/2017   Procedure: HOLMIUM LASER APPLICATION;  Surgeon: Osborn Blaze, MD;  Location: [facility];  Service: Urology;  Laterality: Left;   HOLMIUM LASER APPLICATION Left 02/01/2017   Procedure: HOLMIUM LASER APPLICATION;  Surgeon: Osborn Blaze, MD;  Location: [facility];  Service: Urology;  Laterality: Left;   LUMBAR DISC SURGERY  01/1999   right L5 -- S1   POLYPECTOMY  03/26/2023   Procedure: POLYPECTOMY;  Surgeon: Kenney Peacemaker, MD;  [facility];  Service: Gastroenterology;;   RE-EXPLORATION LUMBAR/  LAMINECTOMY AND MICRODISKECTOMY  10/14/2001   right L5  -- S1   RIGHT HEART CATH N/A 05/23/2022   Procedure: RIGHT HEART CATH;  Surgeon: Mardell Shade, MD;  [facility];  Service: Cardiovascular;  Laterality: N/A;   RIGHT HEART CATH N/A 10/11/2023   Procedure: RIGHT HEART CATH;  Surgeon: Alwin Baars, DO;  [facility];  Service: Cardiovascular;  Laterality: N/A;   RIGHT/LEFT HEART CATH AND CORONARY ANGIOGRAPHY N/A 07/18/2021   Procedure: RIGHT/LEFT HEART CATH AND CORONARY ANGIOGRAPHY;  Surgeon: Mardell Shade, MD;  [facility];  Service: Cardiovascular;  Laterality: N/A;   TUBAL LIGATION Bilateral 10/17/1999   PPTL   UMBILICAL HERNIA REPAIR  age 13   Patient Active Problem List   Diagnosis Date Noted   SOB (shortness of breath) 10/08/2023   History of pulmonary embolism 10/08/2023   DM2 (diabetes mellitus, type 2) (HCC) 10/08/2023   Anemia of chronic disease 10/08/2023   Aortic atherosclerosis (HCC) 10/01/2023   Atherosclerosis of coronary artery 10/01/2023   Pulmonary emboli (HCC) 09/30/2023   HCAP (healthcare-associated pneumonia) 09/12/2023   Acute on chronic combined systolic and diastolic CHF (congestive heart failure) (HCC) 08/23/2023   Frequency of urination and polyuria 08/23/2023   Cerebrovascular accident (CVA) (HCC) 05/26/2023   Pyuria 05/25/2023   Left-sided weakness 05/24/2023   Epistaxis 04/22/2023   Benign neoplasm of cecum 03/26/2023   Benign neoplasm of descending colon 03/26/2023   Benign neoplasm of sigmoid colon 03/26/2023   Adhesive capsulitis of right shoulder 08/24/2022   Mass of right hand 02/22/2022   LBBB (left bundle branch block) 02/01/2022   Type 2 diabetes mellitus with hyperglycemia, without long-term current use of insulin  (HCC) 01/22/2022   HLD (hyperlipidemia) 01/21/2022   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 10/26/2021   Chronic combined systolic and diastolic heart failure (HCC) 07/27/2021   Acute combined  systolic (congestive) and diastolic (congestive) heart failure (HCC)    Hypocalcemia 07/12/2021   Elevated troponin 07/12/2021   Lipoma of hand 07/20/2020   Osteoarthritis of carpometacarpal (CMC) joint of thumb 07/20/2020   Pain in right hand 07/20/2020   Radial styloid tenosynovitis 07/20/2020   Other osteoarthritis of spine 10/23/2019   Suspected COVID-19 virus infection 06/11/2019   Acute respiratory failure with hypoxia (HCC) 06/11/2019   Multifocal pneumonia 06/11/2019   Carpal tunnel syndrome, bilateral 04/24/2019   Ureteral stone with hydronephrosis 12/31/2016   Hx of adenomatous colonic polyps 08/03/2016   Normocytic anemia 07/10/2016   CAP (community acquired pneumonia) 07/06/2016   Essential hypertension 07/26/2015   Eczema 07/26/2015   Overweight (BMI 25.0-29.9) 07/26/2015    PCP: Aldine Humphreys, PA  REFERRING PROVIDER: Haydee Lipa, MD   REFERRING DIAG:  J18.9 (ICD-10-CM) - Pneumonia of right lower lobe due to infectious organism  R53.1 (ICD-10-CM) - Weakness    THERAPY DIAG:  Other abnormalities of gait and mobility  Muscle weakness (generalized)  Rationale for Evaluation and Treatment: Rehabilitation  ONSET  DATE: 10/15/23  SUBJECTIVE:   SUBJECTIVE STATEMENT: 11/14/2023: Pt reports that she has been slowly trying to increase her activity.  She must take frequent rest breaks when she is walking.  EVAL:  Patient reports to PT following several hospital stays with report of pulmonary embolism. Her last hospital admission was on 10/15/23, including rehab for about 1 month. She states that she has been able to switch from the rollator to her cane since then. She was recently able to walk 20 minutes in the grocery store with the shopping cart, but states that it was completely exhausting.   PERTINENT HISTORY: Relevant PMHx includes Anemia, Arthritis, CHF, COPD, DM, HTN, Myocardial infarction, Pulmonary Emboli, CVA  PAIN:  Are you having pain? No    PRECAUTIONS: Fall; Heart failure (does not tolerate reclined positions)  RED FLAGS: None   WEIGHT BEARING RESTRICTIONS: No  FALLS:  Has patient fallen in last 6 months? No  LIVING ENVIRONMENT: Lives with: lives with their spouse Lives in: House/apartment Has following equipment at home: Single point cane and Environmental consultant - 4 wheeled   PLOF: Independent  PATIENT GOALS: "I want to be able to walk long distance (1 miles) at the park without getting as winded/exhausted. I want to be able to do cooking and cleaning without being exhausting."  NEXT MD VISIT: Not scheduled at time of eval   OBJECTIVE:  Note: Objective measures were completed at Evaluation unless otherwise noted.   PATIENT SURVEYS:  Patient-Specific Functional Scale  1. Walking long distance 4/10   2. Cooking a meal 4/10  3. Cleaning (household) 3/10  Total: 11/30 Average: 3.67  COGNITION: Overall cognitive status: Within functional limits for tasks assessed      UPPER EXTREMITY MMT:  MMT Right eval Left eval  Shoulder flexion 4- 4-  Shoulder extension    Shoulder abduction 3+ 4-  Shoulder adduction    Shoulder extension    Shoulder internal rotation 4 4  Shoulder external rotation 4 4  Middle trapezius    Lower trapezius    Elbow flexion 4+ 4+  Elbow extension 4+ 4+  Wrist flexion    Wrist extension    Wrist ulnar deviation    Wrist radial deviation    Wrist pronation    Wrist supination    Grip strength     (Blank rows = not tested)   LOWER EXTREMITY MMT:  MMT Right eval Left eval  Hip flexion 4+ 4+  Hip extension    Hip abduction 4+ 4+  Hip adduction 4+ 4+  Hip internal rotation    Hip external rotation    Knee flexion 4+ 4+  Knee extension 4+ 4+  Ankle dorsiflexion    Ankle plantarflexion    Ankle inversion    Ankle eversion     (Blank rows = not tested)  FUNCTIONAL TESTS:  5 times sit to stand: 16 sec  2 minute walk test: 175 ft, stopped after 90 seconds to rest  Gait  Speed: 0.40m/s = indicating risk of future falls/hospitalization   GAIT: Distance walked: 175 feet Assistive device utilized: Single point cane Level of assistance: Complete Independence  TREATMENT DATE:   [facility] Adult PT Treatment  11/14/2023:  Therapeutic Exercise: nu-step L3 42m while taking subjective and planning session with patient LAQ - 2.5# - 2x10 LAQ with RTB - 10x ea RTB HS HS curl - 2x10 ea Seated RTB abd - 2x10 Seated Hip adduction: 2x10 - 5''  Therapeutic Activity  Standing hip abd - 2x10 ea Education regarding gentle return to activity and appropriate level of exertion during brief rest breaks.    HOME EXERCISE PROGRAM: To be provided at f/u visit   ASSESSMENT:  CLINICAL IMPRESSION:  11/14/2023:  Lynn Estrada tolerated fair well with no adverse reaction.  We introduced basic seated LE strengthening which cause some local fatigue.  Nu-step and standing exercises did cause some moderate fatigue which required brief rest breaks.  HEP updated.  EVAL:  Patient is a 63 y.o. female who was seen today for physical therapy evaluation and treatment for general weakness following several hospital admissions. She is demonstrating slow gait speed that is indicative of risk for future falls and hospitalization. She also has diminished BIL MMT scores throughout upper and lower extremities. She has related pain and difficulty with prolonged walking and performance of normal ADLs/IADLs. She requires skilled PT services at this time to address relevant deficits and improve overall function.     OBJECTIVE IMPAIRMENTS: decreased activity tolerance, decreased balance, decreased endurance, and decreased strength.   ACTIVITY LIMITATIONS: carrying, lifting, bending, standing, squatting, stairs, transfers, and locomotion level  PARTICIPATION LIMITATIONS: meal prep,  cleaning, laundry, shopping, and community activity  PERSONAL FACTORS: Past/current experiences, Time since onset of injury/illness/exacerbation, and 3+ comorbidities: Relevant PMHx includes Anemia, Arthritis, CHF, COPD, DM, HTN, Myocardial infarction, Pulmonary Emboli, CVA are also affecting patient's functional outcome.   REHAB POTENTIAL: Fair    CLINICAL DECISION MAKING: Evolving/moderate complexity  EVALUATION COMPLEXITY: Moderate   GOALS: Goals reviewed with patient? YES  SHORT TERM GOALS: Target date: 12/05/2023    Patient will be independent with initial home program at least 3 days/week.  Baseline: provided at eval Goal Status: INITIAL   2.   Patient will demonstrate improved walking tolerance to at least 225 ft in . Baseline: limited to walking <90 seconds (175 ft)  Goal status: INITIAL   LONG TERM GOALS: Target date: 01/03/2024    Patient will report improved overall functional ability with PSFS total score of 20/30 or greater.  Baseline: 11/30 Goal Status: INITIAL    2.  Patient will demonstrate ability to tolerate at least 15 minutes of walking prior to seated rest break.  Baseline: Unable to tolerate >2 minutes  Goal status: INITIAL  3.  Patient will demonstrate at least 4+/5 UE MMT scores. Baseline: see objective measures  Goal status: INITIAL  4.  Patient will demonstrate ability to perform 5x STS in less than 12 seconds.  Baseline: 16s Goal status: INITIAL  5.  Patient will demonstrate ability to perform at least 15 minutes of standing exercises with concurrent UE exercises in order to have improved tolerance of household cleaning.  Goal status: INITIAL    PLAN:  PT FREQUENCY: 1-2x/week  PT DURATION: 8 weeks  PLANNED INTERVENTIONS: 97164- PT Re-evaluation, 97750- Physical Performance Testing, 97110-Therapeutic exercises, 97530- Therapeutic activity, [id number]- Neuromuscular re-education, 97535- Self Care, [id number]- Manual therapy, [phone]- Gait  training, [phone]- Aquatic Therapy, Patient/Family education, Balance training, and Stair training  [employer] Authorization   Choose one: Rehabilitative  Standardized Assessment or Functional Outcome Tool: See Pain Assessment and Other PFPS  Score or Percent Disability: 11/30  Body Parts Treated (Select each separately):  Lumbopelvic. Overall deficits/functional limitations for body part selected: moderate   If treatment provided at initial evaluation, no treatment charged due to lack of authorization.     PLAN FOR NEXT SESSION: balance assessment, DGI or FGA assessment, address UQ and LQ strengthening, aerobic activity, patient education and HEP and progression towards functional goals    Marquis Sitter PT 11/14/2023 12:38 PM

## 2023-11-15 ENCOUNTER — Other Ambulatory Visit: Payer: Self-pay

## 2023-11-15 ENCOUNTER — Inpatient Hospital Stay (HOSPITAL_COMMUNITY)
Admission: EM | Admit: 2023-11-15 | Discharge: 2023-11-21 | DRG: 286 | Disposition: A | Discharge status: Home Health | Attending: Internal Medicine | Admitting: Internal Medicine

## 2023-11-15 ENCOUNTER — Encounter (HOSPITAL_COMMUNITY): Payer: Self-pay | Admitting: *Deleted

## 2023-11-15 DIAGNOSIS — Z833 Family history of diabetes mellitus: Secondary | ICD-10-CM

## 2023-11-15 DIAGNOSIS — I272 Pulmonary hypertension, unspecified: Secondary | ICD-10-CM | POA: Diagnosis present

## 2023-11-15 DIAGNOSIS — Z905 Acquired absence of kidney: Secondary | ICD-10-CM

## 2023-11-15 DIAGNOSIS — I110 Hypertensive heart disease with heart failure: Principal | ICD-10-CM | POA: Diagnosis present

## 2023-11-15 DIAGNOSIS — Z87891 Personal history of nicotine dependence: Secondary | ICD-10-CM

## 2023-11-15 DIAGNOSIS — E876 Hypokalemia: Secondary | ICD-10-CM | POA: Diagnosis present

## 2023-11-15 DIAGNOSIS — K219 Gastro-esophageal reflux disease without esophagitis: Secondary | ICD-10-CM | POA: Diagnosis present

## 2023-11-15 DIAGNOSIS — Z9079 Acquired absence of other genital organ(s): Secondary | ICD-10-CM

## 2023-11-15 DIAGNOSIS — D649 Anemia, unspecified: Secondary | ICD-10-CM | POA: Diagnosis present

## 2023-11-15 DIAGNOSIS — Z860101 Personal history of adenomatous and serrated colon polyps: Secondary | ICD-10-CM

## 2023-11-15 DIAGNOSIS — I5023 Acute on chronic systolic (congestive) heart failure: Principal | ICD-10-CM | POA: Diagnosis present

## 2023-11-15 DIAGNOSIS — I428 Other cardiomyopathies: Secondary | ICD-10-CM | POA: Diagnosis present

## 2023-11-15 DIAGNOSIS — D500 Iron deficiency anemia secondary to blood loss (chronic): Secondary | ICD-10-CM | POA: Diagnosis present

## 2023-11-15 DIAGNOSIS — E119 Type 2 diabetes mellitus without complications: Secondary | ICD-10-CM

## 2023-11-15 DIAGNOSIS — N179 Acute kidney failure, unspecified: Secondary | ICD-10-CM | POA: Diagnosis present

## 2023-11-15 DIAGNOSIS — Z90722 Acquired absence of ovaries, bilateral: Secondary | ICD-10-CM

## 2023-11-15 DIAGNOSIS — Z8249 Family history of ischemic heart disease and other diseases of the circulatory system: Secondary | ICD-10-CM

## 2023-11-15 DIAGNOSIS — I252 Old myocardial infarction: Secondary | ICD-10-CM

## 2023-11-15 DIAGNOSIS — Z888 Allergy status to other drugs, medicaments and biological substances status: Secondary | ICD-10-CM

## 2023-11-15 DIAGNOSIS — Z881 Allergy status to other antibiotic agents status: Secondary | ICD-10-CM

## 2023-11-15 DIAGNOSIS — I5043 Acute on chronic combined systolic (congestive) and diastolic (congestive) heart failure: Secondary | ICD-10-CM | POA: Diagnosis present

## 2023-11-15 DIAGNOSIS — I700 Atherosclerosis of aorta: Secondary | ICD-10-CM | POA: Diagnosis present

## 2023-11-15 DIAGNOSIS — Z90710 Acquired absence of both cervix and uterus: Secondary | ICD-10-CM

## 2023-11-15 DIAGNOSIS — I447 Left bundle-branch block, unspecified: Secondary | ICD-10-CM | POA: Diagnosis present

## 2023-11-15 DIAGNOSIS — J449 Chronic obstructive pulmonary disease, unspecified: Secondary | ICD-10-CM | POA: Diagnosis present

## 2023-11-15 DIAGNOSIS — I82890 Acute embolism and thrombosis of other specified veins: Secondary | ICD-10-CM | POA: Diagnosis present

## 2023-11-15 DIAGNOSIS — Z7984 Long term (current) use of oral hypoglycemic drugs: Secondary | ICD-10-CM

## 2023-11-15 DIAGNOSIS — Z9049 Acquired absence of other specified parts of digestive tract: Secondary | ICD-10-CM

## 2023-11-15 DIAGNOSIS — Z7901 Long term (current) use of anticoagulants: Secondary | ICD-10-CM

## 2023-11-15 DIAGNOSIS — T502X6A Underdosing of carbonic-anhydrase inhibitors, benzothiadiazides and other diuretics, initial encounter: Secondary | ICD-10-CM | POA: Diagnosis present

## 2023-11-15 DIAGNOSIS — I10 Essential (primary) hypertension: Secondary | ICD-10-CM | POA: Diagnosis present

## 2023-11-15 DIAGNOSIS — Z91040 Latex allergy status: Secondary | ICD-10-CM

## 2023-11-15 DIAGNOSIS — R0902 Hypoxemia: Secondary | ICD-10-CM | POA: Diagnosis present

## 2023-11-15 DIAGNOSIS — R7989 Other specified abnormal findings of blood chemistry: Secondary | ICD-10-CM | POA: Diagnosis present

## 2023-11-15 DIAGNOSIS — Z86711 Personal history of pulmonary embolism: Secondary | ICD-10-CM | POA: Diagnosis present

## 2023-11-15 DIAGNOSIS — Z79899 Other long term (current) drug therapy: Secondary | ICD-10-CM

## 2023-11-15 DIAGNOSIS — I2510 Atherosclerotic heart disease of native coronary artery without angina pectoris: Secondary | ICD-10-CM | POA: Diagnosis present

## 2023-11-15 DIAGNOSIS — Z8673 Personal history of transient ischemic attack (TIA), and cerebral infarction without residual deficits: Secondary | ICD-10-CM

## 2023-11-15 DIAGNOSIS — E785 Hyperlipidemia, unspecified: Secondary | ICD-10-CM | POA: Diagnosis present

## 2023-11-15 DIAGNOSIS — I5084 End stage heart failure: Secondary | ICD-10-CM | POA: Diagnosis present

## 2023-11-15 DIAGNOSIS — Z7902 Long term (current) use of antithrombotics/antiplatelets: Secondary | ICD-10-CM

## 2023-11-15 DIAGNOSIS — Z95810 Presence of automatic (implantable) cardiac defibrillator: Secondary | ICD-10-CM

## 2023-11-15 LAB — URINALYSIS, ROUTINE W REFLEX MICROSCOPIC
Bilirubin Urine: NEGATIVE
Glucose, UA: 50 mg/dL — AB
Ketones, ur: NEGATIVE mg/dL
Nitrite: NEGATIVE
Protein, ur: 30 mg/dL — AB
Specific Gravity, Urine: 1.014 (ref 1.005–1.030)
WBC, UA: >50 WBC/hpf (ref 0–5)
pH: 5 (ref 5.0–8.0)

## 2023-11-15 LAB — CBC
HCT: 32 % — ABNORMAL LOW (ref 36.0–46.0)
Hemoglobin: 9.7 g/dL — ABNORMAL LOW (ref 12.0–15.0)
MCH: 26.9 pg (ref 26.0–34.0)
MCHC: 30.3 g/dL (ref 30.0–36.0)
MCV: 88.9 fL (ref 80.0–100.0)
Platelets: 234 10*3/uL (ref 150–400)
RBC: 3.6 MIL/uL — ABNORMAL LOW (ref 3.87–5.11)
RDW: 16.2 % — ABNORMAL HIGH (ref 11.5–15.5)
WBC: 5.1 10*3/uL (ref 4.0–10.5)
nRBC: 0 % (ref 0.0–0.2)

## 2023-11-15 LAB — COMPREHENSIVE METABOLIC PANEL WITH GFR
ALT: 38 U/L (ref 0–44)
AST: 32 U/L (ref 15–41)
Albumin: 3.7 g/dL (ref 3.5–5.0)
Alkaline Phosphatase: 53 U/L (ref 38–126)
Anion gap: 12 (ref 5–15)
BUN: 21 mg/dL (ref 8–23)
CO2: 25 mmol/L (ref 22–32)
Calcium: 9.5 mg/dL (ref 8.9–10.3)
Chloride: 103 mmol/L (ref 98–111)
Creatinine, Ser: 1.26 mg/dL — ABNORMAL HIGH (ref 0.44–1.00)
GFR, Estimated: 49 mL/min — ABNORMAL LOW (ref >60)
Glucose, Bld: 98 mg/dL (ref 70–99)
Potassium: 3.7 mmol/L (ref 3.5–5.1)
Sodium: 140 mmol/L (ref 135–145)
Total Bilirubin: 1.3 mg/dL — ABNORMAL HIGH (ref 0.0–1.2)
Total Protein: 6.9 g/dL (ref 6.5–8.1)

## 2023-11-15 LAB — LIPASE, BLOOD: Lipase: 44 U/L (ref 11–51)

## 2023-11-15 MED ORDER — ONDANSETRON 4 MG PO TBDP
8.0000 mg | ORAL_TABLET | Freq: Once | ORAL | Status: AC
  Administered 2023-11-15: 8 mg via ORAL
  Filled 2023-11-15: qty 2

## 2023-11-15 NOTE — ED Triage Notes (Signed)
 Nausea vomiting for 2-3 days  she cannot breathe when she lies down  she reports that she has a heart condition

## 2023-11-16 ENCOUNTER — Emergency Department (HOSPITAL_COMMUNITY)

## 2023-11-16 ENCOUNTER — Inpatient Hospital Stay (HOSPITAL_COMMUNITY)

## 2023-11-16 DIAGNOSIS — E782 Mixed hyperlipidemia: Secondary | ICD-10-CM | POA: Diagnosis not present

## 2023-11-16 DIAGNOSIS — I5023 Acute on chronic systolic (congestive) heart failure: Secondary | ICD-10-CM | POA: Diagnosis present

## 2023-11-16 DIAGNOSIS — E876 Hypokalemia: Secondary | ICD-10-CM | POA: Diagnosis present

## 2023-11-16 DIAGNOSIS — Z7984 Long term (current) use of oral hypoglycemic drugs: Secondary | ICD-10-CM | POA: Diagnosis not present

## 2023-11-16 DIAGNOSIS — D500 Iron deficiency anemia secondary to blood loss (chronic): Secondary | ICD-10-CM | POA: Diagnosis present

## 2023-11-16 DIAGNOSIS — I5021 Acute systolic (congestive) heart failure: Secondary | ICD-10-CM

## 2023-11-16 DIAGNOSIS — I447 Left bundle-branch block, unspecified: Secondary | ICD-10-CM | POA: Diagnosis present

## 2023-11-16 DIAGNOSIS — I700 Atherosclerosis of aorta: Secondary | ICD-10-CM | POA: Diagnosis present

## 2023-11-16 DIAGNOSIS — Z8249 Family history of ischemic heart disease and other diseases of the circulatory system: Secondary | ICD-10-CM | POA: Diagnosis not present

## 2023-11-16 DIAGNOSIS — I82890 Acute embolism and thrombosis of other specified veins: Secondary | ICD-10-CM | POA: Diagnosis present

## 2023-11-16 DIAGNOSIS — D649 Anemia, unspecified: Secondary | ICD-10-CM | POA: Diagnosis not present

## 2023-11-16 DIAGNOSIS — I110 Hypertensive heart disease with heart failure: Secondary | ICD-10-CM | POA: Diagnosis present

## 2023-11-16 DIAGNOSIS — K219 Gastro-esophageal reflux disease without esophagitis: Secondary | ICD-10-CM | POA: Diagnosis present

## 2023-11-16 DIAGNOSIS — R7989 Other specified abnormal findings of blood chemistry: Secondary | ICD-10-CM | POA: Diagnosis present

## 2023-11-16 DIAGNOSIS — I2510 Atherosclerotic heart disease of native coronary artery without angina pectoris: Secondary | ICD-10-CM | POA: Diagnosis present

## 2023-11-16 DIAGNOSIS — I5084 End stage heart failure: Secondary | ICD-10-CM | POA: Diagnosis present

## 2023-11-16 DIAGNOSIS — I252 Old myocardial infarction: Secondary | ICD-10-CM | POA: Diagnosis not present

## 2023-11-16 DIAGNOSIS — J449 Chronic obstructive pulmonary disease, unspecified: Secondary | ICD-10-CM | POA: Diagnosis present

## 2023-11-16 DIAGNOSIS — E119 Type 2 diabetes mellitus without complications: Secondary | ICD-10-CM | POA: Diagnosis present

## 2023-11-16 DIAGNOSIS — E785 Hyperlipidemia, unspecified: Secondary | ICD-10-CM | POA: Diagnosis present

## 2023-11-16 DIAGNOSIS — Z7901 Long term (current) use of anticoagulants: Secondary | ICD-10-CM | POA: Diagnosis not present

## 2023-11-16 DIAGNOSIS — Z833 Family history of diabetes mellitus: Secondary | ICD-10-CM | POA: Diagnosis not present

## 2023-11-16 DIAGNOSIS — N179 Acute kidney failure, unspecified: Secondary | ICD-10-CM | POA: Diagnosis present

## 2023-11-16 DIAGNOSIS — Z79899 Other long term (current) drug therapy: Secondary | ICD-10-CM | POA: Diagnosis not present

## 2023-11-16 DIAGNOSIS — I5043 Acute on chronic combined systolic (congestive) and diastolic (congestive) heart failure: Secondary | ICD-10-CM | POA: Diagnosis present

## 2023-11-16 DIAGNOSIS — R0902 Hypoxemia: Secondary | ICD-10-CM | POA: Diagnosis present

## 2023-11-16 DIAGNOSIS — I272 Pulmonary hypertension, unspecified: Secondary | ICD-10-CM | POA: Diagnosis present

## 2023-11-16 DIAGNOSIS — I428 Other cardiomyopathies: Secondary | ICD-10-CM | POA: Diagnosis present

## 2023-11-16 LAB — CBC
HCT: 31.3 % — ABNORMAL LOW (ref 36.0–46.0)
Hemoglobin: 9.8 g/dL — ABNORMAL LOW (ref 12.0–15.0)
MCH: 27.4 pg (ref 26.0–34.0)
MCHC: 31.3 g/dL (ref 30.0–36.0)
MCV: 87.4 fL (ref 80.0–100.0)
Platelets: 243 10*3/uL (ref 150–400)
RBC: 3.58 MIL/uL — ABNORMAL LOW (ref 3.87–5.11)
RDW: 16 % — ABNORMAL HIGH (ref 11.5–15.5)
WBC: 4.4 10*3/uL (ref 4.0–10.5)
nRBC: 0 % (ref 0.0–0.2)

## 2023-11-16 LAB — TROPONIN I (HIGH SENSITIVITY)
Troponin I (High Sensitivity): 47 ng/L — ABNORMAL HIGH (ref <18)
Troponin I (High Sensitivity): 51 ng/L — ABNORMAL HIGH (ref <18)

## 2023-11-16 LAB — BASIC METABOLIC PANEL WITH GFR
Anion gap: 14 (ref 5–15)
BUN: 21 mg/dL (ref 8–23)
CO2: 24 mmol/L (ref 22–32)
Calcium: 9.4 mg/dL (ref 8.9–10.3)
Chloride: 100 mmol/L (ref 98–111)
Creatinine, Ser: 1.25 mg/dL — ABNORMAL HIGH (ref 0.44–1.00)
GFR, Estimated: 49 mL/min — ABNORMAL LOW (ref >60)
Glucose, Bld: 97 mg/dL (ref 70–99)
Potassium: 3.5 mmol/L (ref 3.5–5.1)
Sodium: 138 mmol/L (ref 135–145)

## 2023-11-16 LAB — FERRITIN: Ferritin: 122 ng/mL (ref 11–307)

## 2023-11-16 LAB — ECHOCARDIOGRAM LIMITED
Area-P 1/2: 5.66 cm2
Calc EF: 13.2 %
Est EF: <20
Height: 74 in
S' Lateral: 7.1 cm
Single Plane A2C EF: 14.1 %
Single Plane A4C EF: 17.4 %
Weight: 2955.93 [oz_av]

## 2023-11-16 LAB — BRAIN NATRIURETIC PEPTIDE: B Natriuretic Peptide: 970.4 pg/mL — ABNORMAL HIGH (ref 0.0–100.0)

## 2023-11-16 LAB — IRON AND TIBC
Iron: 43 ug/dL (ref 28–170)
Saturation Ratios: 13 % (ref 10.4–31.8)
TIBC: 343 ug/dL (ref 250–450)
UIBC: 300 ug/dL

## 2023-11-16 LAB — PHOSPHORUS: Phosphorus: 3.9 mg/dL (ref 2.5–4.6)

## 2023-11-16 LAB — MAGNESIUM: Magnesium: 2 mg/dL (ref 1.7–2.4)

## 2023-11-16 MED ORDER — SENNOSIDES-DOCUSATE SODIUM 8.6-50 MG PO TABS
2.0000 | ORAL_TABLET | Freq: Once | ORAL | Status: AC
  Administered 2023-11-16: 2 via ORAL
  Filled 2023-11-16: qty 2

## 2023-11-16 MED ORDER — ATORVASTATIN CALCIUM 10 MG PO TABS
10.0000 mg | ORAL_TABLET | Freq: Every day | ORAL | Status: DC
  Administered 2023-11-16 – 2023-11-21 (×6): 10 mg via ORAL
  Filled 2023-11-16 (×7): qty 1

## 2023-11-16 MED ORDER — MIDODRINE HCL 5 MG PO TABS
5.0000 mg | ORAL_TABLET | Freq: Three times a day (TID) | ORAL | Status: DC
  Administered 2023-11-16 – 2023-11-21 (×17): 5 mg via ORAL
  Filled 2023-11-16 (×17): qty 1

## 2023-11-16 MED ORDER — PROCHLORPERAZINE EDISYLATE 10 MG/2ML IJ SOLN: 5.0000 mg | Freq: Four times a day (QID) | INTRAMUSCULAR | Status: DC | PRN

## 2023-11-16 MED ORDER — APIXABAN 5 MG PO TABS
5.0000 mg | ORAL_TABLET | Freq: Two times a day (BID) | ORAL | Status: DC
  Administered 2023-11-16 – 2023-11-21 (×11): 5 mg via ORAL
  Filled 2023-11-16 (×11): qty 1

## 2023-11-16 MED ORDER — ACETAMINOPHEN 325 MG PO TABS
650.0000 mg | ORAL_TABLET | Freq: Four times a day (QID) | ORAL | Status: DC | PRN
Start: 2023-11-16 — End: 2023-11-18

## 2023-11-16 MED ORDER — BENZONATATE 100 MG PO CAPS
200.0000 mg | ORAL_CAPSULE | Freq: Three times a day (TID) | ORAL | Status: DC | PRN
  Administered 2023-11-17: 200 mg via ORAL
  Filled 2023-11-16: qty 2

## 2023-11-16 MED ORDER — POLYETHYLENE GLYCOL 3350 17 G PO PACK: 17.0000 g | PACK | Freq: Every day | ORAL | Status: DC | PRN

## 2023-11-16 MED ORDER — MELATONIN 5 MG PO TABS
5.0000 mg | ORAL_TABLET | Freq: Every evening | ORAL | Status: DC | PRN
  Administered 2023-11-16 – 2023-11-18 (×3): 5 mg via ORAL
  Filled 2023-11-16 (×4): qty 1

## 2023-11-16 MED ORDER — PERFLUTREN LIPID MICROSPHERE
1.0000 mL | INTRAVENOUS | Status: AC | PRN
  Administered 2023-11-16: 2 mL via INTRAVENOUS

## 2023-11-16 MED ORDER — FUROSEMIDE 10 MG/ML IJ SOLN
20.0000 mg | Freq: Once | INTRAMUSCULAR | Status: AC
  Administered 2023-11-16: 20 mg via INTRAVENOUS
  Filled 2023-11-16: qty 2

## 2023-11-16 MED ORDER — FUROSEMIDE 10 MG/ML IJ SOLN
80.0000 mg | Freq: Two times a day (BID) | INTRAMUSCULAR | Status: DC
  Administered 2023-11-16 (×2): 80 mg via INTRAVENOUS
  Filled 2023-11-16 (×2): qty 8

## 2023-11-16 MED ORDER — POTASSIUM CHLORIDE CRYS ER 20 MEQ PO TBCR
40.0000 meq | EXTENDED_RELEASE_TABLET | Freq: Two times a day (BID) | ORAL | Status: AC
  Administered 2023-11-16 (×2): 40 meq via ORAL
  Filled 2023-11-16 (×2): qty 2

## 2023-11-16 NOTE — ED Provider Notes (Signed)
 [place] EMERGENCY DEPARTMENT AT [facility] Provider Note   CSN: [id number] Arrival date & time: 11/15/23  2222     History  Chief Complaint  Patient presents with   Shortness of Breath   Emesis    Lynn Estrada is a 63 y.o. female.  Patient presents to the emergency department with multiple complaints.  She reports that she has not been feeling well for the last several days.  She has had cough and congestion.  Patient also reports nausea, vomiting and generalized weakness.  Over the last few days she has become progressively more short of breath, reports that she cannot lie flat because of the severe shortness of breath.  Patient reports a history of congestive heart failure.  She has an AICD.  She reports that she was hospitalized multiple times for congestive heart failure with similar symptoms.  She also states that she was recently hospitalized at [facility] and treated for dehydration.       Home Medications Prior to Admission medications   Medication Sig Start Date End Date Taking? Authorizing Provider  acetaminophen  (TYLENOL ) 500 MG tablet Take 500-1,000 mg by mouth every 6 (six) hours as needed (for pain.).    [provider]  apixaban  (ELIQUIS ) 5 MG TABS tablet Take 1 tablet (5 mg total) by mouth 2 (two) times daily. 10/24/23   Ruddy Corral M, PA-C  atorvastatin  (LIPITOR) 10 MG tablet Take 10 mg by mouth in the morning. 05/15/21   [provider]  benzonatate  (TESSALON ) 200 MG capsule Take 1 capsule (200 mg total) by mouth 3 (three) times daily as needed for cough (not relieved with tussionex). 09/17/23   [place], Rema Care, DO  Calcium  Carbonate Antacid (TUMS E-X PO) Take 1 tablet by mouth daily.    [provider]  chlorpheniramine-HYDROcodone  (TUSSIONEX) 10-8 MG/5ML Take 5 mLs by mouth every 12 (twelve) hours as needed for cough. Patient not taking: Reported on 10/29/2023 09/17/23   [place], Eric J, DO  clopidogrel  (PLAVIX ) 75 MG  tablet Take 75 mg by mouth daily.    [provider]  JARDIANCE  10 MG TABS tablet Take 10 mg by mouth daily. 09/07/23   [provider]  metFORMIN  (GLUCOPHAGE ) 500 MG tablet Take 1 tablet by mouth 2 (two) times daily with a meal. 10/07/23   [provider]  midodrine  (PROAMATINE ) 5 MG tablet Take 1 tablet (5 mg total) by mouth 3 (three) times daily with meals. 09/17/23 12/16/23  [place], Eric J, DO  Multiple Vitamins-Minerals (ONE-A-DAY WOMENS PO) Take 1 tablet by mouth daily with breakfast.    [provider]  ondansetron  (ZOFRAN -ODT) 4 MG disintegrating tablet Take 4 mg by mouth every 8 (eight) hours as needed for nausea or vomiting (dissolve orally).    [provider]  torsemide  (DEMADEX ) 20 MG tablet Take 1 tablet (20 mg total) by mouth 2 (two) times a week. Every Monday and Friday 11/04/23   Bensimhon, Rheta Celestine, MD      Allergies    Latex, Bactrim [sulfamethoxazole -trimethoprim], Carvedilol , and Metoprolol     Review of Systems   Review of Systems  Physical Exam Updated Vital Signs BP 130/85   Pulse (!) 105   Temp 98.2 F (36.8 C) (Oral)   Resp (!) 25   Ht 6\' 2"  (1.88 m)   Wt 83.8 kg   SpO2 92%   BMI 23.72 kg/m  Physical Exam Vitals and nursing note reviewed.  Constitutional:      General: She  is not in acute distress.    Appearance: She is well-developed.  HENT:     Head: Normocephalic and atraumatic.     Mouth/Throat:     Mouth: Mucous membranes are moist.  Eyes:     General: Vision grossly intact. Gaze aligned appropriately.     Extraocular Movements: Extraocular movements intact.     Conjunctiva/sclera: Conjunctivae normal.  Cardiovascular:     Rate and Rhythm: Regular rhythm. Tachycardia present.     Pulses: Normal pulses.     Heart sounds: Normal heart sounds, S1 normal and S2 normal. No murmur heard.    No friction rub. No gallop.  Pulmonary:     Effort: Pulmonary effort is normal. No respiratory distress.     Breath  sounds: Normal breath sounds.  Abdominal:     General: Bowel sounds are normal.     Palpations: Abdomen is soft.     Tenderness: There is no abdominal tenderness. There is no guarding or rebound.     Hernia: No hernia is present.  Musculoskeletal:        General: No swelling.     Cervical back: Full passive range of motion without pain, normal range of motion and neck supple. No spinous process tenderness or muscular tenderness. Normal range of motion.     Right lower leg: No edema.     Left lower leg: No edema.  Skin:    General: Skin is warm and dry.     Capillary Refill: Capillary refill takes less than 2 seconds.     Findings: No ecchymosis, erythema, rash or wound.  Neurological:     General: No focal deficit present.     Mental Status: She is alert and oriented to person, place, and time.     GCS: GCS eye subscore is 4. GCS verbal subscore is 5. GCS motor subscore is 6.     Cranial Nerves: Cranial nerves 2-12 are intact.     Sensory: Sensation is intact.     Motor: Motor function is intact.     Coordination: Coordination is intact.  Psychiatric:        Attention and Perception: Attention normal.        Mood and Affect: Mood normal.        Speech: Speech normal.        Behavior: Behavior normal.     ED Results / Procedures / Treatments   Labs (all labs ordered are listed, but only abnormal results are displayed) Labs Reviewed  COMPREHENSIVE METABOLIC PANEL WITH GFR - Abnormal; Notable for the following components:      Result Value   Creatinine, Ser 1.26 (*)    Total Bilirubin 1.3 (*)    GFR, Estimated 49 (*)    All other components within normal limits  CBC - Abnormal; Notable for the following components:   RBC 3.60 (*)    Hemoglobin 9.7 (*)    HCT 32.0 (*)    RDW 16.2 (*)    All other components within normal limits  URINALYSIS, ROUTINE W REFLEX MICROSCOPIC - Abnormal; Notable for the following components:   APPearance CLOUDY (*)    Glucose, UA 50 (*)    Hgb  urine dipstick SMALL (*)    Protein, ur 30 (*)    Leukocytes,Ua LARGE (*)    Bacteria, UA MANY (*)    All other components within normal limits  BRAIN NATRIURETIC PEPTIDE - Abnormal; Notable for the following components:   B Natriuretic Peptide 970.4 (*)  All other components within normal limits  TROPONIN I (HIGH SENSITIVITY) - Abnormal; Notable for the following components:   Troponin I (High Sensitivity) 47 (*)    All other components within normal limits  URINE CULTURE  LIPASE, BLOOD  TROPONIN I (HIGH SENSITIVITY)    EKG EKG Interpretation Date/Time:  Friday Nov 15 2023 23:15:35 EDT Ventricular Rate:  104 PR Interval:  118 QRS Duration:  146 QT Interval:  430 QTC Calculation: 565 R Axis:   237  Text Interpretation: Atrial-sensed ventricular-paced rhythm Abnormal ECG When compared with ECG of 24-Oct-2023 14:38, PREVIOUS ECG IS PRESENT Confirmed by Ballard Bongo ([id number]) on 11/16/2023 1:52:46 AM  Radiology DG Chest Port 1 View Result Date: 11/16/2023 CLINICAL DATA:  Shortness of breath CHF. EXAM: PORTABLE CHEST 1 VIEW COMPARISON:  PA and lateral chest 10/07/2023 FINDINGS: There is a left chest dual lead pacing system with a third AID wire and stable wire insertions. The heart is enlarged. There is central vascular prominence, mild interstitial edema in the lung bases and small pleural effusions. There are atelectatic bands in both lower lung fields, elevated right hemidiaphragm and no focal airspace disease process is seen. The mediastinum is stable. There is calcification of the transverse aorta. No acute osseous findings. IMPRESSION: 1. Cardiomegaly with central vascular prominence, mild interstitial edema in the lung bases and small pleural effusions. Findings consistent with mild CHF or fluid overload. Similar findings on the prior study. 2. Atelectatic bands in both lower lung fields. 3. Aortic atherosclerosis. Electronically Signed   By: Denman Fischer M.D.   On:  11/16/2023 02:48    Procedures Procedures    Medications Ordered in ED Medications  ondansetron  (ZOFRAN -ODT) disintegrating tablet 8 mg (8 mg Oral Given 11/15/23 2333)    ED Course/ Medical Decision Making/ A&P                                 Medical Decision Making Amount and/or Complexity of Data Reviewed Labs: ordered. Radiology: ordered.   Patient with history of heart failure with reduced ejection fraction of around 20% presents with shortness of breath, orthopnea.  She reports that she has had a several pound weight gain and feels like she has increased volume.  Patient requiring 3 L of oxygen to maintain her saturations here in the ED.  She does not normally use oxygen at home.  Chest x-ray suspicious for volume overload.  Patient's troponins are chronically elevated.  BNP above her baseline.  Cardiac lab work consistent with troponin leak secondary to decompensated congestive heart failure.  Patient reports nausea and vomiting at home.  This appears to be somewhat of a chronic problem.  She was admitted at [facility] 3 weeks ago with acute onset nausea and vomiting resulting in acute kidney injury.  Workup at that time including gallbladder ultrasound and CAT scan did not reveal any abnormality.  Patient will be diuresed and admitted for further management.        Final Clinical Impression(s) / ED Diagnoses Final diagnoses:  Acute on chronic systolic congestive heart failure [facility])    Rx / DC Orders ED Discharge Orders     None         Shrita Thien, Marine Sia, MD 11/16/23 [phone]

## 2023-11-16 NOTE — Consult Note (Signed)
 Advanced Heart Failure Team Consult Note   Primary Physician: Aldine Humphreys, PA Cardiologist:  None  Reason for Consultation: HF  HPI:    Lynn Estrada is seen today for evaluation of recurrent HF at the request of Dr. Del Favia.   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 Got good result with significant QRS narrowing.    Echo 11/24 EF 20-25%   Admitted in 3/25 with respiratory failure. Dound to have small PEs. Entresto  stopped due to low BP  Admitted in 4/25 with HF. RA 7 PA 50/31 (38) PCWP 31 TD 4.4/2.1 Felt to be nearing end-stage HF but not current candidate for advanced therapies given recent pulmonary embolism as well as significant deconditioning.   Seen in HF Clinic 10/24/23. Very weak. Felt to be overdiuresed. Diuretics held and bisoprolol  stopped. Scr 1.07 -> 3.38 LA 1.0 Told to come to the ER but she took herself to [facility]. Felt to be very volume depleted in setting of possible viral gastroenteritis. Given copious IVF.  Scr 1.4  spiro, digoxin  and torsemide  were held. Placed on midodrine  for BP support.    I saw her in clinic on 10/29/23 torsemide  20 started on M/F  Presented to ED yesterday with SOB and nausea. In the ER, tachycardic and tachypneic with O2 saturation in the low 90s to 88%, was placed on 2 L nasal cannula.  Chest x-ray revealed cardiomegaly with central vascular prominence, mild interstitial edema. Received IV lasix  with decent response  BNP 639 -> 970  Says she feels some better but still SOB.   Home Medications Prior to Admission medications   Medication Sig Start Date End Date Taking? Authorizing Provider  acetaminophen  (TYLENOL ) 500 MG tablet Take 500-1,000 mg by mouth every 6 (six) hours as needed (for pain.).   Yes [provider]  apixaban  (ELIQUIS ) 5 MG TABS tablet Take 1  tablet (5 mg total) by mouth 2 (two) times daily. 10/24/23  Yes Ruddy Corral M, PA-C  atorvastatin  (LIPITOR) 10 MG tablet Take 10 mg by mouth in the morning. 05/15/21  Yes [provider]  calcium  carbonate (TUMS - DOSED IN MG ELEMENTAL CALCIUM ) 500 MG chewable tablet Chew 1 tablet by mouth daily as needed for heartburn or indigestion.   Yes [provider]  clopidogrel  (PLAVIX ) 75 MG tablet Take 75 mg by mouth daily.   Yes [provider]  JARDIANCE  10 MG TABS tablet Take 10 mg by mouth daily. 09/07/23  Yes [provider]  metFORMIN  (GLUCOPHAGE ) 500 MG tablet Take 1 tablet by mouth 2 (two) times daily with a meal. 10/07/23  Yes [provider]  midodrine  (PROAMATINE ) 5 MG tablet Take 1 tablet (5 mg total) by mouth 3 (three) times daily with meals. 09/17/23 12/16/23 Yes [place], Rema Care, DO  Multiple Vitamins-Minerals (ONE-A-DAY WOMENS PO) Take 1 tablet by mouth daily with breakfast.   Yes [provider]  ondansetron  (ZOFRAN ) 4 MG tablet Take 4 mg by mouth every 8 (eight) hours as needed for nausea or vomiting.   Yes [provider]  torsemide  (DEMADEX ) 20 MG tablet Take 20 mg by mouth 2 (two) times a week. Take 1 tablet (20mg  total) by mouth 2 (two) times a week on Monday and Friday.   Yes [provider]    Past Medical History: Past Medical History:  Diagnosis Date   Anemia    Arthritis    CHF (congestive heart failure) (HCC) 12/06/2022   EF is 20% to 25 %   COPD (chronic obstructive pulmonary disease) (HCC)    Diabetes mellitus without complication (HCC)    Eczema    GERD (gastroesophageal reflux disease)    History of adenomatous polyp of colon    08-03-2016  tubular adenoma x2 and hyperplastic   History of chronic gastritis 08/03/2016   History of ectopic pregnancy 1988   s/p  left salpingectomy   History of endometriosis    History of kidney stones    History of partial nephrectomy    right pelvis for very  large stone   History of pneumonia 07/02/2016   CAP   History of sepsis    07-01-2016  sepsis w/ pyelonephritis, CAP /   12-31-2016  urosepsis w/ kidney stone obstruction   History of uterine leiomyoma    Hx of adenomatous colonic polyps 08/03/2016   2 adenomas, 1 hpp and 1 lost polyp 2018 all < 1 cm    Hyperlipidemia    Hypertension    Left ureteral stone    Myocardial infarction [facility])    Nephrolithiasis    left obstructive stone and right non-obstructive stone  per CT 12-31-2016   Urgency of urination     Past Surgical History: Past Surgical History:  Procedure Laterality Date   ABDOMINAL HYSTERECTOMY  01/20/2007   w/ Lysis Adhesions/  Left salpingoophorectomy/  Right Salpingectomy   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   Procedure: BIV ICD INSERTION CRT-D;  Surgeon: Efraim Grange, MD;  Location: [facility] INVASIVE CV LAB;  Service: Cardiovascular;  Laterality: N/A;   CHOLECYSTECTOMY  1994   and Right Partial Nephrectomy (pelvis for very large stone)   COLONOSCOPY WITH PROPOFOL  N/A 03/26/2023   Procedure: COLONOSCOPY WITH PROPOFOL ;  Surgeon: Kenney Peacemaker, MD;  Location: [facility];  Service: Gastroenterology;  Laterality: N/A;   CYSTOSCOPY W/ URETERAL STENT PLACEMENT Left 12/31/2016   Procedure: CYSTOSCOPY WITH RETROGRADE PYELOGRAM/ LEFT URETERAL STENT PLACEMENT;  Surgeon: Osborn Blaze, MD;  Location: [facility] ORS;  Service: Urology;  Laterality: Left;   CYSTOSCOPY WITH RETROGRADE PYELOGRAM, URETEROSCOPY AND STENT PLACEMENT Left 01/18/2017   Procedure: 1ST STAGE CYSTOSCOPY WITH RETROGRADE PYELOGRAM, URETEROSCOPY AND STENT REPLACEMENT;  Surgeon: Osborn Blaze, MD;  Location: [facility];  Service: Urology;  Laterality: Left;   CYSTOSCOPY WITH RETROGRADE PYELOGRAM, URETEROSCOPY AND STENT PLACEMENT Left 02/01/2017   Procedure: 2ND STAGE CYSTOSCOPY WITH RETROGRADE PYELOGRAM, URETEROSCOPY AND STENT REPLACEMENT;  Surgeon: Osborn Blaze, MD;  Location: [facility];  Service: Urology;  Laterality: Left;   ECTOPIC PREGNANCY SURGERY  1988   Left Salpingectomy   HOLMIUM LASER APPLICATION Left 01/18/2017   Procedure: HOLMIUM LASER APPLICATION;  Surgeon: Osborn Blaze, MD;  Location: [facility];  Service: Urology;  Laterality: Left;   HOLMIUM LASER APPLICATION Left 02/01/2017   Procedure: HOLMIUM LASER APPLICATION;  Surgeon: Osborn Blaze, MD;  Location: [facility];  Service: Urology;  Laterality: Left;   LUMBAR DISC SURGERY  01/1999   right L5 -- S1   POLYPECTOMY  03/26/2023   Procedure: POLYPECTOMY;  Surgeon: Kenney Peacemaker, MD;  Location: [facility];  Service: Gastroenterology;;   RE-EXPLORATION LUMBAR/  LAMINECTOMY AND MICRODISKECTOMY  10/14/2001   right L5 -- S1   RIGHT HEART CATH N/A 05/23/2022   Procedure: RIGHT HEART CATH;  Surgeon: Mardell Shade, MD;  Location: [facility]  INVASIVE CV LAB;  Service: Cardiovascular;  Laterality: N/A;   RIGHT HEART CATH N/A 10/11/2023   Procedure: RIGHT HEART CATH;  Surgeon: Alwin Baars, DO;  Location: [facility];  Service: Cardiovascular;  Laterality: N/A;   RIGHT/LEFT HEART CATH AND CORONARY ANGIOGRAPHY N/A 07/18/2021   Procedure: RIGHT/LEFT HEART CATH AND CORONARY ANGIOGRAPHY;  Surgeon: Mardell Shade, MD;  Location: [facility];  Service: Cardiovascular;  Laterality: N/A;   TUBAL LIGATION Bilateral 10/17/1999   PPTL   UMBILICAL HERNIA REPAIR  age 20    Family History: Family History  Problem Relation Age of Onset   Sarcoidosis Mother    Prostate cancer Father    Hypertension Sister    Eczema Sister    Lupus Sister    Breast cancer Maternal Aunt    Gout Maternal Grandmother    Diabetes Maternal Grandmother        leg amputations   Stomach cancer Neg Hx    Colon cancer Neg Hx    Esophageal cancer Neg Hx    Rectal cancer Neg Hx     Social History: Social History   Socioeconomic History   Marital status: Married    Spouse name:  Richard Dust   Number of children: 1   Years of education: college   Highest education level: Not on file  Occupational History   Occupation: Product manager  Tobacco Use   Smoking status: Former    Current packs/day: 0.00    Average packs/day: 0.8 packs/day for 5.0 years (3.8 ttl pk-yrs)    Types: Cigarettes    Start date: 01/11/1992    Quit date: 01/10/1997    Years since quitting: 26.8   Smokeless tobacco: Never  Vaping Use   Vaping status: Never Used  Substance and Sexual Activity   Alcohol use: Not Currently    Comment: social use; once a month   Drug use: No   Sexual activity: Yes    Partners: Male    Birth control/protection: Surgical  Other Topics Concern   Not on file  Social History Narrative   Lives with her husband and their daughter and their dog.   Social Drivers of Corporate investment banker Strain: Low Risk  (10/29/2023)   Received from [facility]   Overall Financial Resource Strain (CARDIA)    Difficulty of Paying Living Expenses: Not hard at all  Food Insecurity: No Food Insecurity (10/29/2023)   Received from [facility]   Hunger Vital Sign    Worried About Running Out of Food in the Last Year: Never true    Ran Out of Food in the Last Year: Never true  Transportation Needs: No Transportation Needs (10/29/2023)   Received from [facility] - Transportation    In the past 12 months, has lack of transportation kept you from medical appointments or from getting medications?: No    Lack of Transportation (Non-Medical): No  Physical Activity: Unknown (09/24/2023)   Received [facility]   Exercise Vital Sign    Days of Exercise per Week: 0 days    Minutes of Exercise per Session: Not on file  Stress: No Stress Concern Present (09/24/2023)   Received from [facility] - Occupational Stress Questionnaire    Feeling of Stress : Not at all   Social Connections: Patient Declined (09/30/2023)   Social Connection and Isolation Panel [NHANES]    Frequency  of Communication with Friends and Family: Patient declined    Frequency of Social Gatherings with Friends and Family: Patient declined    Attends Religious Services: Patient declined    Database administrator or Organizations: Patient declined    Attends Banker Meetings: Patient declined    Marital Status: Patient declined    Allergies:  Allergies  Allergen Reactions   Latex Rash and Hives   Bactrim [Sulfamethoxazole -Trimethoprim] Rash   Carvedilol  Itching   Metoprolol  Itching and Rash    Objective:    Vital Signs:   Temp:  [98.2 F (36.8 C)-98.4 F (36.9 C)] 98.4 F (36.9 C) (05/17 0900) Pulse Rate:  [64-112] 64 (05/17 0900) Resp:  [17-26] 26 (05/17 0900) BP: (104-130)/(78-95) 104/78 (05/17 0900) SpO2:  [92 %-99 %] 95 % (05/17 0900) Weight:  [83.8 kg] 83.8 kg (05/16 2302)    Weight change: Filed Weights   11/15/23 2302  Weight: 83.8 kg    Intake/Output:   Intake/Output Summary (Last 24 hours) at 11/16/2023 1000 Last data filed at 11/16/2023 0851 Gross per 24 hour  Intake --  Output 800 ml  Net -800 ml      Physical Exam    General:  Weak appearing. No resp difficulty HEENT: normal Neck: supple. JVP 7-8 . Carotids 2+ bilat; no bruits. No lymphadenopathy or thyromegaly appreciated. Cor: PMI nondisplaced. Regular rate & rhythm. No rubs, gallops or murmurs. Lungs: clear Abdomen: soft, nontender, nondistended. No hepatosplenomegaly. No bruits or masses. Good bowel sounds. Extremities: no cyanosis, clubbing, rash, tr edema Neuro: alert & orientedx3, cranial nerves grossly intact. moves all 4 extremities w/o difficulty. Affect pleasant   Telemetry   SR 60s Personally reviewed  EKG    Sinus tach 104 with biv pacng. Personally reviewed  Labs   Basic Metabolic Panel: Recent Labs  Lab 11/15/23 2315 11/16/23 0518  NA 140 138  K  3.7 3.5  CL 103 100  CO2 25 24  GLUCOSE 98 97  BUN 21 21  CREATININE 1.26* 1.25*  CALCIUM  9.5 9.4  MG  --  2.0  PHOS  --  3.9    Liver Function Tests: Recent Labs  Lab 11/15/23 2315  AST 32  ALT 38  ALKPHOS 53  BILITOT 1.3*  PROT 6.9  ALBUMIN 3.7   Recent Labs  Lab 11/15/23 2315  LIPASE 44   No results for input(s): "AMMONIA" in the last 168 hours.  CBC: Recent Labs  Lab 11/15/23 2315  WBC 5.1  HGB 9.7*  HCT 32.0*  MCV 88.9  PLT 234    Cardiac Enzymes: No results for input(s): "CKTOTAL", "CKMB", "CKMBINDEX", "TROPONINI" in the last 168 hours.  BNP: BNP (last 3 results) Recent Labs    10/10/23 0222 10/29/23 1036 11/16/23 0241  BNP 373.5* 639.2* 970.4*    ProBNP (last 3 results) No results for input(s): "PROBNP" in the last 8760 hours.   CBG: No results for input(s): "GLUCAP" in the last 168 hours.  Coagulation Studies: No results for input(s): "LABPROT", "INR" in the last 72 hours.   Imaging   DG Chest Port 1 View Result Date: 11/16/2023 CLINICAL DATA:  Shortness of breath CHF. EXAM: PORTABLE CHEST 1 VIEW COMPARISON:  PA and lateral chest 10/07/2023 FINDINGS: There is a left chest dual lead pacing system with a third AID wire and stable wire insertions. The heart is enlarged. There is central vascular prominence, mild interstitial edema in the lung bases and small pleural effusions. There are atelectatic bands in  both lower lung fields, elevated right hemidiaphragm and no focal airspace disease process is seen. The mediastinum is stable. There is calcification of the transverse aorta. No acute osseous findings. IMPRESSION: 1. Cardiomegaly with central vascular prominence, mild interstitial edema in the lung bases and small pleural effusions. Findings consistent with mild CHF or fluid overload. Similar findings on the prior study. 2. Atelectatic bands in both lower lung fields. 3. Aortic atherosclerosis. Electronically Signed   By: Denman Fischer M.D.    On: 11/16/2023 02:48     Medications:     Current Medications:  apixaban   5 mg Oral BID   atorvastatin   10 mg Oral Daily   furosemide   80 mg Intravenous BID   midodrine   5 mg Oral TID WC    Infusions:     Assessment/Plan   1. Acute on chronic Combined Systolic/Diastolic Heart Failure, NICM - LHC 1/23 with min CAD. cMRI possible myocarditis  LVEF 18% Suggestive of LBBB, RVEF 33% - Echo 4/23: EF 30-35% -> EF improving but still with significant LV dysfunction (suspect resolving myocarditis) - Echo 10/23. EF 20-25% with nl RV - s/p Abbott CRT-D 7/24 for LBBB  - Echo 2/25 EF 20-25% RV ok  - Recently admitted w/ NYHA Class IIIb-IV symptoms w/ volume overload  - RHC 10/11/23: RA 7, PA 51/30 (38), PCWP 31, CO/CI(TD) 4.44/2.12=>>diuresed w/ IV Lasix . D/c wt 182 lb  - Admitted 3/25 and 4/25 with ADHF - Admitted to [facility] 4/25 with AK due to overdiuresis. Hydrated and GDMT held - Now readmitted with mild volume overload - Agree with IV lasix . Will give 80 IV bid - Continue Jardiance  10 - Off spiro and Entresto  and digoxin  with low BP and previous AKI - Continue midodrine  for BP support - Not candidate for advanced HF therapies currently.   2. PE - diagnosed 3/25, small burden RLL. LE venous dopplers + for SVT. No DVT   - Continue Eliquis . No bleeding  3 LBBB - LBBB, QRS 176 [place] - s/p Abbott CRT-D 7/24 - IStable on ECG   4. Partial R Nephrectomy with recent AKI due to overdiuresis - b/l SCr 1.1  - Scr stable at 1.2.follow with diuresis    5. H/o CVA 11/24 - no significant residual deficits  - on statin per Neuro  - Continue Eliquis   6. Hypokalemia - supp  Length of Stay: 0  Jules Oar, MD  11/16/2023, 10:00 AM   Advanced Heart Failure Team Pager [phone] (M-F; 7a - 5p)  Please contact [facility] for night-coverage after hours (4p -7a ) and weekends on [email or web address]

## 2023-11-16 NOTE — Progress Notes (Signed)
 Courtesy visit: No billing  Patient is seen and examined today morning in the ED.  States that her shortness of breath, leg swelling much improved.  Asks about bed placement upstairs.  Denies any chest pain or palpitations.  Also she wishes to go home tomorrow.  I discussed with her regarding heart failure and kidney dysfunction.  She understands heart failure medications can cause kidney problem.  Heart failure team did see her, advised 80 mg IV Lasix  twice daily.  She is off spironolactone  and Entresto  due to recent AKI admission at [facility].  She is not candidate for advanced heart failure therapies currently.  Advised her to stay for IV diuresis with close monitoring of kidney function.  Patient understands and agrees.

## 2023-11-16 NOTE — Plan of Care (Signed)
   Problem: Activity: Goal: Risk for activity intolerance will decrease Outcome: Progressing   Problem: Nutrition: Goal: Adequate nutrition will be maintained Outcome: Progressing   Problem: Elimination: Goal: Will not experience complications related to bowel motility Outcome: Progressing

## 2023-11-16 NOTE — H&P (Addendum)
 History and Physical  Lynn Estrada:[id number] DOB: 12-11-1960 DOA: 11/15/2023  Referring physician: Dr. Carie Charity, EDP. PCP: Aldine Humphreys, PA  Outpatient Specialists: Cardiology. Patient coming from: Home.  Chief Complaint: Shortness of breath and 5 pounds weight gain for the past 2 days.  HPI: Lynn Estrada is a 63 y.o. female with medical history significant for chronic HFrEF with LVEF 20% status post biventricular pacemaker placement, pulmonary hypertension, pericardial effusion seen on 2D echo done on 11/05/2023, type 2 diabetes, history of CVA, history of PE on Eliquis , who presented to the ER with complaints of 2 days of progressive dyspnea.  Worse with ambulation.  Associated with bilateral lower extremity edema and 5 pounds weight gain in the past 2 days.  States she has been taking her home diuretic, torsemide , only twice a week because it makes her sick and nauseous.  Denies any chest pain.  In the ER, tachycardic and tachypneic with O2 saturation in the low 90s to 88%, was placed on 2 L nasal cannula.  Chest x-ray revealed cardiomegaly with central vascular prominence, mild interstitial edema in the lung bases and small pleural effusions.  Findings consistent with mild CHF and fluid overload.  Similar findings on the prior study.  Atelectatic bands in both lower lung fields.  Aortic atherosclerosis.  The patient received IV Lasix  in the ER.  EDP requesting admission for further diurese.  Admitted by [facility], hospitalist service.  At the time of this visit, the patient is alert only x 4.  She appears uncomfortable due to shortness of breath.  She speaks in short sentences due to her dyspnea.  Denies chest pain.  No subjective fevers or chills reported.  ED Course: Temperature 98.2.  BP 120/85, pulse 105, respiratory rate 25, O2 saturation 9 2% on 2 L nasal cannula.  Lab studies notable for BNP 970, troponin 47.  Creatinine 1.26, above baseline of 0.9.  Review of Systems: Review  of systems as noted in the HPI. All other systems reviewed and are negative.   Past Medical History:  Diagnosis Date   Anemia    Arthritis    CHF (congestive heart failure) (HCC) 12/06/2022   EF is 20% to 25 %   COPD (chronic obstructive pulmonary disease) (HCC)    Diabetes mellitus without complication (HCC)    Eczema    GERD (gastroesophageal reflux disease)    History of adenomatous polyp of colon    08-03-2016  tubular adenoma x2 and hyperplastic   History of chronic gastritis 08/03/2016   History of ectopic pregnancy 1988   s/p  left salpingectomy   History of endometriosis    History of kidney stones    History of partial nephrectomy    right pelvis for very large stone   History of pneumonia 07/02/2016   CAP   History of sepsis    07-01-2016  sepsis w/ pyelonephritis, CAP /   12-31-2016  urosepsis w/ kidney stone obstruction   History of uterine leiomyoma    Hx of adenomatous colonic polyps 08/03/2016   2 adenomas, 1 hpp and 1 lost polyp 2018 all < 1 cm    Hyperlipidemia    Hypertension    Left ureteral stone    Myocardial infarction [facility])    Nephrolithiasis    left obstructive stone and right non-obstructive stone  per CT 12-31-2016   Urgency of urination    Past Surgical History:  Procedure Laterality Date   ABDOMINAL HYSTERECTOMY  01/20/2007   w/ Lysis Adhesions/  Left salpingoophorectomy/  Right Salpingectomy   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   Procedure: BIV ICD INSERTION CRT-D;  Surgeon: Mealor, Donnamae Gaba, MD;  Location: [facility] INVASIVE CV LAB;  Service: Cardiovascular;  Laterality: N/A;   CHOLECYSTECTOMY  1994   and Right Partial Nephrectomy (pelvis for very large stone)   COLONOSCOPY WITH PROPOFOL  N/A 03/26/2023   Procedure: COLONOSCOPY WITH PROPOFOL ;  Surgeon: Kenney Peacemaker, MD;  Location: [facility] ENDOSCOPY;  Service: Gastroenterology;  Laterality: N/A;   CYSTOSCOPY W/ URETERAL STENT PLACEMENT Left 12/31/2016   Procedure: CYSTOSCOPY WITH RETROGRADE  PYELOGRAM/ LEFT URETERAL STENT PLACEMENT;  Surgeon: Osborn Blaze, MD;  Location: [facility] ORS;  Service: Urology;  Laterality: Left;   CYSTOSCOPY WITH RETROGRADE PYELOGRAM, URETEROSCOPY AND STENT PLACEMENT Left 01/18/2017   Procedure: 1ST STAGE CYSTOSCOPY WITH RETROGRADE PYELOGRAM, URETEROSCOPY AND STENT REPLACEMENT;  Surgeon: Osborn Blaze, MD;  Location: [facility];  Service: Urology;  Laterality: Left;   CYSTOSCOPY WITH RETROGRADE PYELOGRAM, URETEROSCOPY AND STENT PLACEMENT Left 02/01/2017   Procedure: 2ND STAGE CYSTOSCOPY WITH RETROGRADE PYELOGRAM, URETEROSCOPY AND STENT REPLACEMENT;  Surgeon: Osborn Blaze, MD;  Location: [facility];  Service: Urology;  Laterality: Left;   ECTOPIC PREGNANCY SURGERY  1988   Left Salpingectomy   HOLMIUM LASER APPLICATION Left 01/18/2017   Procedure: HOLMIUM LASER APPLICATION;  Surgeon: Osborn Blaze, MD;  Location: [facility];  Service: Urology;  Laterality: Left;   HOLMIUM LASER APPLICATION Left 02/01/2017   Procedure: HOLMIUM LASER APPLICATION;  Surgeon: Osborn Blaze, MD;  Location: [facility];  Service: Urology;  Laterality: Left;   LUMBAR DISC SURGERY  01/1999   right L5 -- S1   POLYPECTOMY  03/26/2023   Procedure: POLYPECTOMY;  Surgeon: Kenney Peacemaker, MD;  Location: [facility] ENDOSCOPY;  Service: Gastroenterology;;   RE-EXPLORATION LUMBAR/  LAMINECTOMY AND MICRODISKECTOMY  10/14/2001   right L5 -- S1   RIGHT HEART CATH N/A 05/23/2022   Procedure: RIGHT HEART CATH;  Surgeon: Mardell Shade, MD;  Location: [facility] INVASIVE CV LAB;  Service: Cardiovascular;  Laterality: N/A;   RIGHT HEART CATH N/A 10/11/2023   Procedure: RIGHT HEART CATH;  Surgeon: Alwin Baars, DO;  Location: [facility] INVASIVE CV LAB;  Service: Cardiovascular;  Laterality: N/A;   RIGHT/LEFT HEART CATH AND CORONARY ANGIOGRAPHY N/A 07/18/2021   Procedure: RIGHT/LEFT HEART CATH AND CORONARY ANGIOGRAPHY;  Surgeon: Mardell Shade, MD;   Location: [facility] INVASIVE CV LAB;  Service: Cardiovascular;  Laterality: N/A;   TUBAL LIGATION Bilateral 10/17/1999   PPTL   UMBILICAL HERNIA REPAIR  age 20    Social History:  reports that she quit smoking about 26 years ago. Her smoking use included cigarettes. She started smoking about 31 years ago. She has a 3.8 pack-year smoking history. She has never used smokeless tobacco. She reports that she does not currently use alcohol. She reports that she does not use drugs.   Allergies  Allergen Reactions   Latex Rash and Hives   Bactrim [Sulfamethoxazole -Trimethoprim] Rash   Carvedilol  Itching   Metoprolol  Itching and Rash    Family History  Problem Relation Age of Onset   Sarcoidosis Mother    Prostate cancer Father    Hypertension Sister    Eczema Sister    Lupus Sister    Breast cancer Maternal Aunt    Gout Maternal Grandmother    Diabetes Maternal Grandmother        leg amputations   Stomach cancer Neg Hx    Colon cancer  Neg Hx    Esophageal cancer Neg Hx    Rectal cancer Neg Hx       Prior to Admission medications   Medication Sig Start Date End Date Taking? Authorizing Provider  acetaminophen  (TYLENOL ) 500 MG tablet Take 500-1,000 mg by mouth every 6 (six) hours as needed (for pain.).    [provider]  apixaban  (ELIQUIS ) 5 MG TABS tablet Take 1 tablet (5 mg total) by mouth 2 (two) times daily. 10/24/23   Ruddy Corral M, PA-C  atorvastatin  (LIPITOR) 10 MG tablet Take 10 mg by mouth in the morning. 05/15/21   [provider]  benzonatate  (TESSALON ) 200 MG capsule Take 1 capsule (200 mg total) by mouth 3 (three) times daily as needed for cough (not relieved with tussionex). 09/17/23   [place], Rema Care, DO  Calcium  Carbonate Antacid (TUMS E-X PO) Take 1 tablet by mouth daily.    [provider]  chlorpheniramine-HYDROcodone  (TUSSIONEX) 10-8 MG/5ML Take 5 mLs by mouth every 12 (twelve) hours as needed for cough. Patient not taking: Reported on  10/29/2023 09/17/23   [place], Rema Care, DO  clopidogrel  (PLAVIX ) 75 MG tablet Take 75 mg by mouth daily.    [provider]  JARDIANCE  10 MG TABS tablet Take 10 mg by mouth daily. 09/07/23   [provider]  metFORMIN  (GLUCOPHAGE ) 500 MG tablet Take 1 tablet by mouth 2 (two) times daily with a meal. 10/07/23   [provider]  midodrine  (PROAMATINE ) 5 MG tablet Take 1 tablet (5 mg total) by mouth 3 (three) times daily with meals. 09/17/23 12/16/23  [place], Eric J, DO  Multiple Vitamins-Minerals (ONE-A-DAY WOMENS PO) Take 1 tablet by mouth daily with breakfast.    [provider]  ondansetron  (ZOFRAN -ODT) 4 MG disintegrating tablet Take 4 mg by mouth every 8 (eight) hours as needed for nausea or vomiting (dissolve orally).    [provider]  torsemide  (DEMADEX ) 20 MG tablet Take 1 tablet (20 mg total) by mouth 2 (two) times a week. Every Monday and Friday 11/04/23   Bensimhon, Rheta Celestine, MD    Physical Exam: BP 130/85   Pulse (!) 105   Temp 98.2 F (36.8 C) (Oral)   Resp (!) 25   Ht 6\' 2"  (1.88 m)   Wt 83.8 kg   SpO2 92%   BMI 23.72 kg/m   General: 63 y.o. year-old female well developed well nourished in no acute distress.  Alert and oriented x3. Cardiovascular: Tachycardic with no rubs or gallops.  No thyromegaly.  Moderate left JVD noted.  1+ pitting edema in lower extremities bilaterally. Respiratory: Mild rales diffusely and bilaterally.  Poor inspiratory effort. Abdomen: Soft nontender nondistended with normal bowel sounds x4 quadrants. Muskuloskeletal: No cyanosis or clubbing noted bilaterally Neuro: CN II-XII intact, strength, sensation, reflexes Skin: No ulcerative lesions noted or rashes Psychiatry: Judgement and insight appear normal. Mood is appropriate for condition and setting          Labs on Admission:  Basic Metabolic Panel: Recent Labs  Lab 11/15/23 2315  NA 140  K 3.7  CL 103  CO2 25  GLUCOSE 98  BUN 21  CREATININE 1.26*   CALCIUM  9.5   Liver Function Tests: Recent Labs  Lab 11/15/23 2315  AST 32  ALT 38  ALKPHOS 53  BILITOT 1.3*  PROT 6.9  ALBUMIN 3.7   Recent Labs  Lab 11/15/23 2315  LIPASE 44   No results for input(s): "AMMONIA" in the last 168  hours. CBC: Recent Labs  Lab 11/15/23 2315  WBC 5.1  HGB 9.7*  HCT 32.0*  MCV 88.9  PLT 234   Cardiac Enzymes: No results for input(s): "CKTOTAL", "CKMB", "CKMBINDEX", "TROPONINI" in the last 168 hours.  BNP (last 3 results) Recent Labs    10/10/23 0222 10/29/23 1036 11/16/23 0241  BNP 373.5* 639.2* 970.4*    ProBNP (last 3 results) No results for input(s): "PROBNP" in the last 8760 hours.  CBG: No results for input(s): "GLUCAP" in the last 168 hours.  Radiological Exams on Admission: DG Chest Port 1 View Result Date: 11/16/2023 CLINICAL DATA:  Shortness of breath CHF. EXAM: PORTABLE CHEST 1 VIEW COMPARISON:  PA and lateral chest 10/07/2023 FINDINGS: There is a left chest dual lead pacing system with a third AID wire and stable wire insertions. The heart is enlarged. There is central vascular prominence, mild interstitial edema in the lung bases and small pleural effusions. There are atelectatic bands in both lower lung fields, elevated right hemidiaphragm and no focal airspace disease process is seen. The mediastinum is stable. There is calcification of the transverse aorta. No acute osseous findings. IMPRESSION: 1. Cardiomegaly with central vascular prominence, mild interstitial edema in the lung bases and small pleural effusions. Findings consistent with mild CHF or fluid overload. Similar findings on the prior study. 2. Atelectatic bands in both lower lung fields. 3. Aortic atherosclerosis. Electronically Signed   By: Denman Fischer M.D.   On: 11/16/2023 02:48    EKG: I independently viewed the EKG done and my findings are as followed: Atrial paced rate of 104.  Nonspecific ST T changes.  QTc 565.  Assessment/Plan Present on  Admission:  Acute on chronic systolic (congestive) heart failure (HCC)  Principal Problem:   Acute on chronic systolic (congestive) heart failure (HCC)  Acute on chronic HFrEF, LVEF 20%, status post biventricular pacemaker placement Presented with progressive dyspnea for the past 48 hours associated with 5 pounds weight gain. Recent 2D echo done on 11/05/2023 revealed LVEF 20% with left ventricle global hypokinesis Follows with advanced heart failure clinic On torsemide  prior to admission twice a week on Mondays and Fridays Start strict I's and O's and daily weight.  AKI, prerenal, secondary to poor oral intake Closely monitor renal function while on diuretics. Monitor urine output Avoid nephrotoxic agents and hypotension. Repeat BMP in the morning.  Acute hypoxic respiratory failure secondary to pulmonary edema O2 saturation 88% on room air Currently requiring 2 L to maintain O2 saturation above 92% Ongoing diuresing Early mobilization Wean off oxygen supplementation as tolerated.  Hyperlipidemia Resume home Lipitor.  History of pulmonary embolism Resume home Eliquis .  Chronic normocytic anemia Follow iron studies Replete if indicated.  Pulmonary hypertension Outpatient follow-up with cardiology.  History of pericardial effusion seen on most recent 2D echo 11/05/2023. Continue diuresing Limited 2D echo to reassess   Critical care time: 65 minutes.   DVT prophylaxis: Home Eliquis   Code Status: Full code.  Family Communication: None at bedside.  Disposition Plan: Telemetry cardiac unit.  Consults called: Cardiology.  Admission status: Inpatient status.   Status is: Inpatient The patient requires at least 2 midnights for further evaluation and treatment of present condition.   Bary Boss MD [facility] Pager [phone]  If 7PM-7AM, please contact night-coverage [email or web address] Password [email or web address]  11/16/2023, 4:27 AM

## 2023-11-16 NOTE — Progress Notes (Signed)
 Echocardiogram 2D Echocardiogram has been performed.  Sonna Lipsky N Harlea Goetzinger,RDCS 11/16/2023, 1:32 PM

## 2023-11-17 DIAGNOSIS — D649 Anemia, unspecified: Secondary | ICD-10-CM | POA: Diagnosis not present

## 2023-11-17 DIAGNOSIS — N179 Acute kidney failure, unspecified: Secondary | ICD-10-CM | POA: Diagnosis not present

## 2023-11-17 DIAGNOSIS — E782 Mixed hyperlipidemia: Secondary | ICD-10-CM | POA: Diagnosis not present

## 2023-11-17 DIAGNOSIS — I5023 Acute on chronic systolic (congestive) heart failure: Secondary | ICD-10-CM | POA: Diagnosis not present

## 2023-11-17 MED ORDER — POTASSIUM CHLORIDE CRYS ER 20 MEQ PO TBCR
40.0000 meq | EXTENDED_RELEASE_TABLET | Freq: Once | ORAL | Status: AC
  Administered 2023-11-17: 40 meq via ORAL
  Filled 2023-11-17: qty 2

## 2023-11-17 NOTE — Plan of Care (Signed)
   Problem: Education: Goal: Knowledge of General Education information will improve Description Including pain rating scale, medication(s)/side effects and non-pharmacologic comfort measures Outcome: Progressing   Problem: Health Behavior/Discharge Planning: Goal: Ability to manage health-related needs will improve Outcome: Progressing

## 2023-11-17 NOTE — Progress Notes (Signed)
 Advanced Heart Failure Rounding Note   Subjective:    Diuresed 2.7L overnight on IV lasix .   Weight down almost 5 pounds.   She is worried fluid is coming off too quickly.   Denies CP or SOB. Scr stable   Objective:   Weight Range:  Vital Signs:   Temp:  [97.7 F (36.5 C)-98.6 F (37 C)] 98.6 F (37 C) (05/18 0519) Pulse Rate:  [64-100] 92 (05/18 0519) Resp:  [16-27] 18 (05/18 0519) BP: (88-113)/(61-91) 91/66 (05/18 0519) SpO2:  [86 %-100 %] 95 % (05/18 0519) Weight:  [81.6 kg] 81.6 kg (05/18 0519) Last BM Date :  (PTA)  Weight change: Filed Weights   11/15/23 2302 11/17/23 0519  Weight: 83.8 kg 81.6 kg    Intake/Output:   Intake/Output Summary (Last 24 hours) at 11/17/2023 0829 Last data filed at 11/17/2023 0519 Gross per 24 hour  Intake 1280 ml  Output 2700 ml  Net -1420 ml     Physical Exam: General:  Well appearing. No resp difficulty HEENT: normal Neck: supple. no JVD. Carotids 2+ bilat; no bruits. No lymphadenopathy or thryomegaly appreciated. Cor: PMI nondisplaced. Regular rate & rhythm. No rubs, gallops or murmurs. Lungs: clear Abdomen: soft, nontender, nondistended. No hepatosplenomegaly. No bruits or masses. Good bowel sounds. Extremities: no cyanosis, clubbing, rash, edema Neuro: alert & orientedx3, cranial nerves grossly intact. moves all 4 extremities w/o difficulty. Affect pleasant  Telemetry: Sinus 90s Personally reviewed  Labs: Basic Metabolic Panel: Recent Labs  Lab 11/15/23 2315 11/16/23 0518  NA 140 138  K 3.7 3.5  CL 103 100  CO2 25 24  GLUCOSE 98 97  BUN 21 21  CREATININE 1.26* 1.25*  CALCIUM  9.5 9.4  MG  --  2.0  PHOS  --  3.9    Liver Function Tests: Recent Labs  Lab 11/15/23 2315  AST 32  ALT 38  ALKPHOS 53  BILITOT 1.3*  PROT 6.9  ALBUMIN 3.7   Recent Labs  Lab 11/15/23 2315  LIPASE 44   No results for input(s): "AMMONIA" in the last 168 hours.  CBC: Recent Labs  Lab 11/15/23 2315  11/16/23 1458  WBC 5.1 4.4  HGB 9.7* 9.8*  HCT 32.0* 31.3*  MCV 88.9 87.4  PLT 234 243    Cardiac Enzymes: No results for input(s): "CKTOTAL", "CKMB", "CKMBINDEX", "TROPONINI" in the last 168 hours.  BNP: BNP (last 3 results) Recent Labs    10/10/23 0222 10/29/23 1036 11/16/23 0241  BNP 373.5* 639.2* 970.4*    ProBNP (last 3 results) No results for input(s): "PROBNP" in the last 8760 hours.    Other results:  Imaging: ECHOCARDIOGRAM LIMITED Result Date: 11/16/2023    ECHOCARDIOGRAM LIMITED REPORT   Patient Name:   Lynn Estrada Date of Exam: 11/16/2023 Medical Rec #:  [id number]         Height:       74.0 in Accession #:    [id number]        Weight:       184.7 lb Date of Birth:  1961-01-27        BSA:          2.101 m Patient Age:    62 years          BP:           126/77 mmHg Patient Gender: F                 HR:  92 bpm. Exam Location:  Inpatient Procedure: 2D Echo, 3D Echo, Cardiac Doppler, Limited Color Doppler, Limited            Echo and Intracardiac Opacification Agent (Both Spectral and Color            Flow Doppler were utilized during procedure). Indications:    CHF-Acute Systolic  History:        Patient has prior history of Echocardiogram examinations, most                 recent 11/05/2023. CHF, Stroke, Arrythmias:LBBB,                 Signs/Symptoms:Shortness of Breath; Risk Factors:Hypertension                 and Former Smoker.  Sonographer:    Travis Friedman Referring Phys: [id number] CAROLE N HALL IMPRESSIONS  1. Left ventricular ejection fraction, by estimation, is <20%. Left ventricular ejection fraction by 3D volume is 23 %. The left ventricle has severely decreased function. The left ventricle demonstrates global hypokinesis. The left ventricular internal  cavity size was severely dilated. Left ventricular diastolic parameters are indeterminate.  2. Right ventricular systolic function is mildly reduced. The right ventricular size is normal.  3. Left atrial size  was severely dilated.  4. Right atrial size was mildly dilated.  5. A small pericardial effusion is present. The pericardial effusion is posterior to the left ventricle. There is no evidence of cardiac tamponade.  6. The mitral valve is normal in structure. Mild mitral valve regurgitation. No evidence of mitral stenosis.  7. The aortic valve is normal in structure. Aortic valve regurgitation is trivial. No aortic stenosis is present. FINDINGS  Left Ventricle: Left ventricular ejection fraction, by estimation, is <20%. Left ventricular ejection fraction by 3D volume is 23 %. The left ventricle has severely decreased function. The left ventricle demonstrates global hypokinesis. Definity contrast agent was given IV to delineate the left ventricular endocardial borders. The left ventricular internal cavity size was severely dilated. There is no left ventricular hypertrophy. Left ventricular diastolic parameters are indeterminate. Right Ventricle: The right ventricular size is normal. No increase in right ventricular wall thickness. Right ventricular systolic function is mildly reduced. Left Atrium: Left atrial size was severely dilated. Right Atrium: Right atrial size was mildly dilated. Pericardium: A small pericardial effusion is present. The pericardial effusion is posterior to the left ventricle. There is no evidence of cardiac tamponade. Mitral Valve: The mitral valve is normal in structure. Mild mitral valve regurgitation. No evidence of mitral valve stenosis. Tricuspid Valve: The tricuspid valve is normal in structure. Tricuspid valve regurgitation is not demonstrated. No evidence of tricuspid stenosis. Aortic Valve: The aortic valve is normal in structure. Aortic valve regurgitation is trivial. No aortic stenosis is present. Pulmonic Valve: The pulmonic valve was normal in structure. Pulmonic valve regurgitation is not visualized. No evidence of pulmonic stenosis. Aorta: The aortic root is normal in size and  structure. IAS/Shunts: No atrial level shunt detected by color flow Doppler. Additional Comments: 3D was performed not requiring image post processing on an independent workstation and was abnormal.  LEFT VENTRICLE PLAX 2D LVIDd:         7.70 cm LVIDs:         7.10 cm LV PW:         1.10 cm         3D Volume EF LV IVS:        1.00  cm         LV 3D EF:    Left                                             ventricul                                             ar LV Volumes (MOD)                            ejection LV vol d, MOD    389.0 ml                   fraction A2C:                                        by 3D LV vol d, MOD    419.0 ml                   volume is A4C:                                        23 %. LV vol s, MOD    334.0 ml A2C: LV vol s, MOD    346.0 ml      3D Volume EF: A4C:                           3D EF:        23 % LV SV MOD A2C:   55.0 ml       LV EDV:       344 ml LV SV MOD A4C:   419.0 ml      LV ESV:       265 ml LV SV MOD BP:    54.3 ml       LV SV:        79 ml RIGHT VENTRICLE          IVC RV Basal diam:  3.70 cm  IVC diam: 1.40 cm LEFT ATRIUM              Index        RIGHT ATRIUM           Index LA diam:        5.30 cm  2.52 cm/m   RA Area:     17.80 cm LA Vol (A2C):   132.0 ml 62.82 ml/m  RA Volume:   49.60 ml  23.60 ml/m LA Vol (A4C):   86.8 ml  41.31 ml/m LA Biplane Vol: 116.0 ml 55.20 ml/m   AORTA Ao Root diam: 2.90 cm MITRAL VALVE MV Area (PHT): 5.66 cm MV Decel Time: 134 msec MV E velocity: 104.00 cm/s MV A velocity: 59.40 cm/s MV E/A ratio:  1.75 Jules Oar MD Electronically signed by Jules Oar MD Signature Date/Time: 11/16/2023/2:42:34 PM    Final    DG Chest Port 1 View Result Date: 11/16/2023 CLINICAL DATA:  Shortness of breath CHF. EXAM: PORTABLE CHEST 1 VIEW COMPARISON:  PA and lateral chest 10/07/2023 FINDINGS: There is a left chest dual lead pacing system with a third AID wire and stable wire insertions. The heart is enlarged. There is central  vascular prominence, mild interstitial edema in the lung bases and small pleural effusions. There are atelectatic bands in both lower lung fields, elevated right hemidiaphragm and no focal airspace disease process is seen. The mediastinum is stable. There is calcification of the transverse aorta. No acute osseous findings. IMPRESSION: 1. Cardiomegaly with central vascular prominence, mild interstitial edema in the lung bases and small pleural effusions. Findings consistent with mild CHF or fluid overload. Similar findings on the prior study. 2. Atelectatic bands in both lower lung fields. 3. Aortic atherosclerosis. Electronically Signed   By: Denman Fischer M.D.   On: 11/16/2023 02:48     Medications:     Scheduled Medications:  apixaban   5 mg Oral BID   atorvastatin   10 mg Oral Daily   furosemide   80 mg Intravenous BID   midodrine   5 mg Oral TID WC    Infusions:   PRN Medications: acetaminophen , benzonatate , melatonin, polyethylene glycol, prochlorperazine   Assessment/Plan   1. Acute on chronic Combined Systolic/Diastolic Heart Failure, NICM - LHC 1/23 with min CAD. cMRI possible myocarditis  LVEF 18% Suggestive of LBBB, RVEF 33% - Echo 4/23: EF 30-35% -> EF improving but still with significant LV dysfunction (suspect resolving myocarditis) - Echo 10/23. EF 20-25% with nl RV - s/p Abbott CRT-D 7/24 for LBBB  - Echo 2/25 EF 20-25% RV ok  - Echo 11/16/23 EF < 20% RV mildly HK - RHC 10/11/23: RA 7, PA 51/30 (38), PCWP 31, CO/CI(TD) 4.44/2.12=>>diuresed w/ IV Lasix . D/c wt 182 lb  - Admitted 3/25 and 4/25 with ADHF - Admitted to [facility] 4/25 with AK due to overdiuresis. Hydrated and GDMT held - Now readmitted with mild volume overload - Diuresing well on IV lasix . Volume status looks good. - Continue Jardiance  10 - Off spiro and Entresto  and digoxin  with low BP and previous AKI - Continue midodrine  for BP support - She has advanced /end-stage HF. Will have to decide if she is candidate  for advanced therapies. Will repeat RHC in am and d/w family   2. PE - diagnosed 3/25, small burden RLL. LE venous dopplers + for SVT. No DVT   - Continue Eliquis . No bleeding.    3 LBBB - LBBB, QRS 176 ms - s/p Abbott CRT-D 7/24   4. Partial R Nephrectomy with recent AKI due to overdiuresis - b/l SCr 1.1  - Scr stable at 1.25 today.  - Follow with diuresis   5. H/o CVA 11/24 - no significant residual deficits  - on statin per Neuro  - Continue Eliquis    6. Hypokalemia - K 3.5. Wil supp   Length of Stay: 1   Jules Oar MD 11/17/2023, 8:29 AM  Advanced Heart Failure Team Pager [phone] (M-F; 7a - 4p)  Please contact [facility] for night-coverage after hours (4p -7a ) and weekends on [email or web address]

## 2023-11-17 NOTE — Progress Notes (Signed)
 Progress Note   Patient: Lynn Estrada:[id number] DOB: May 19, 1961 DOA: 11/15/2023     1 DOS: the patient was seen and examined on 11/17/2023   Brief hospital course: Lynn Estrada is a 63 y.o. female with medical history significant for chronic HFrEF with LVEF 20% status post biventricular pacemaker placement, pulmonary hypertension, pericardial effusion seen on 2D echo done on 11/05/2023, type 2 diabetes, history of CVA, history of PE on Eliquis , who presented to the ER with complaints of 2 days of progressive dyspnea.  Worse with ambulation.  Associated with bilateral lower extremity edema and 5 pounds weight gain in the past 2 days.  States she has been taking her home diuretic, torsemide , only twice a week because it makes her sick and nauseous.  Patient is admitted to the [facility] service for further management evaluation of CHF exacerbation with heart failure team consultation.  Assessment and Plan: Acute on chronic combined systolic and diastolic heart failure: Patient presented with shortness of breath, leg edema, weight gain. BNP 970.  Echocardiogram reviewed showed EF less than 20%. Heart failure team evaluation appreciated. Patient got IV Lasix  80 mg twice daily yesterday.  She is diuresing well, seems euvolemic.  Hold IV Lasix  at this time. Her oxygen tapered off.   Recent [facility] admission for AKI, Entresto , digoxin , spironolactone  is held.  BP lower side noted.  Continue Jardiance . Further management of advanced therapies pending right heart cath which is planned for tomorrow.  Acute kidney injury- Kidney function stable. Continue to hold diuresis at this time. Avoid nephrotoxic drugs. Monitor daily renal function.  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  Chronic normocytic anemia- Iron studies reviewed unremarkable.  Hemoglobin stable.  No active bleeding.  Hyperlipidemia-continue Lipitor.        Out of bed to chair. Incentive spirometry. Nursing  supportive care. Fall, aspiration precautions. Diet:  Diet Orders (From admission, onward)     Start     Ordered   11/18/23 0001  Diet NPO time specified  Diet effective midnight        11/17/23 0917   11/16/23 0429  Diet Heart Fluid consistency: Thin; Fluid restriction: 1800 mL Fluid  Diet effective now       Question Answer Comment  Fluid consistency: Thin   Fluid restriction: 1800 mL Fluid      11/16/23 0428           DVT prophylaxis:  apixaban  (ELIQUIS ) tablet 5 mg  Level of care: Telemetry Cardiac   Code Status: Full Code  Subjective: Patient is seen and examined today morning. She feels much better, wishes to go home. Eating fair. Getting out of bed.  Off supplemental oxygen.  Physical Exam: Vitals:   11/16/23 2310 11/17/23 0519 11/17/23 0841 11/17/23 1103  BP: 96/71 91/66 91/72  (!) 89/66  Pulse: 98 92    Resp: 16 18 18 18   Temp: 98 F (36.7 C) 98.6 F (37 C) 98.4 F (36.9 C) 98.6 F (37 C)  TempSrc: Oral Oral Oral Oral  SpO2: 93% 95%    Weight:  81.6 kg    Height:        General - Elderly African American female, no apparent distress HEENT - PERRLA, EOMI, atraumatic head, non tender sinuses. Lung - Clear, basal rales, no rhonchi, wheezes. Heart - S1, S2 heard, no murmurs, rubs, trace pedal edema. Abdomen - Soft, non tender, bowel sounds good Neuro - Alert, awake and oriented x 3, non focal exam. Skin - Warm and dry.  Data Reviewed:      Latest Ref Rng & Units 11/16/2023    2:58 PM 11/15/2023   11:15 PM 10/13/2023    2:43 AM  CBC  WBC 4.0 - 10.5 K/uL 4.4  5.1  4.7   Hemoglobin 12.0 - 15.0 g/dL 9.8  9.7  01.0   Hematocrit 36.0 - 46.0 % 31.3  32.0  34.2   Platelets 150 - 400 K/uL 243  234  247       Latest Ref Rng & Units 11/16/2023    5:18 AM 11/15/2023   11:15 PM 10/29/2023   10:36 AM  BMP  Glucose 70 - 99 mg/dL 97  98  272   BUN 8 - 23 mg/dL 21  21  16    Creatinine 0.44 - 1.00 mg/dL 5.36  6.44  0.34   Sodium 135 - 145 mmol/L 138  140  138    Potassium 3.5 - 5.1 mmol/L 3.5  3.7  3.9   Chloride 98 - 111 mmol/L 100  103  104   CO2 22 - 32 mmol/L 24  25  25    Calcium  8.9 - 10.3 mg/dL 9.4  9.5  9.0    ECHOCARDIOGRAM LIMITED Result Date: 11/16/2023    ECHOCARDIOGRAM LIMITED REPORT   Patient Name:   Lynn Estrada Stare Date of Exam: 11/16/2023 Medical Rec #:  [id number]         Height:       74.0 in Accession #:    [id number]        Weight:       184.7 lb Date of Birth:  [birth year]        BSA:          2.101 m Patient Age:    62 years          BP:           126/77 mmHg Patient Gender: F                 HR:           92 bpm. Exam Location:  Inpatient Procedure: 2D Echo, 3D Echo, Cardiac Doppler, Limited Color Doppler, Limited            Echo and Intracardiac Opacification Agent (Both Spectral and Color            Flow Doppler were utilized during procedure). Indications:    CHF-Acute Systolic  History:        Patient has prior history of Echocardiogram examinations, most                 recent 11/05/2023. CHF, Stroke, Arrythmias:LBBB,                 Signs/Symptoms:Shortness of Breath; Risk Factors:Hypertension                 and Former Smoker.  Sonographer:    Travis Friedman Referring Phys: [id number] CAROLE N HALL IMPRESSIONS  1. Left ventricular ejection fraction, by estimation, is <20%. Left ventricular ejection fraction by 3D volume is 23 %. The left ventricle has severely decreased function. The left ventricle demonstrates global hypokinesis. The left ventricular internal  cavity size was severely dilated. Left ventricular diastolic parameters are indeterminate.  2. Right ventricular systolic function is mildly reduced. The right ventricular size is normal.  3. Left atrial size was severely dilated.  4. Right atrial size was mildly dilated.  5. A small pericardial effusion is present. The pericardial effusion  is posterior to the left ventricle. There is no evidence of cardiac tamponade.  6. The mitral valve is normal in structure. Mild mitral valve  regurgitation. No evidence of mitral stenosis.  7. The aortic valve is normal in structure. Aortic valve regurgitation is trivial. No aortic stenosis is present. FINDINGS  Left Ventricle: Left ventricular ejection fraction, by estimation, is <20%. Left ventricular ejection fraction by 3D volume is 23 %. The left ventricle has severely decreased function. The left ventricle demonstrates global hypokinesis. Definity contrast agent was given IV to delineate the left ventricular endocardial borders. The left ventricular internal cavity size was severely dilated. There is no left ventricular hypertrophy. Left ventricular diastolic parameters are indeterminate. Right Ventricle: The right ventricular size is normal. No increase in right ventricular wall thickness. Right ventricular systolic function is mildly reduced. Left Atrium: Left atrial size was severely dilated. Right Atrium: Right atrial size was mildly dilated. Pericardium: A small pericardial effusion is present. The pericardial effusion is posterior to the left ventricle. There is no evidence of cardiac tamponade. Mitral Valve: The mitral valve is normal in structure. Mild mitral valve regurgitation. No evidence of mitral valve stenosis. Tricuspid Valve: The tricuspid valve is normal in structure. Tricuspid valve regurgitation is not demonstrated. No evidence of tricuspid stenosis. Aortic Valve: The aortic valve is normal in structure. Aortic valve regurgitation is trivial. No aortic stenosis is present. Pulmonic Valve: The pulmonic valve was normal in structure. Pulmonic valve regurgitation is not visualized. No evidence of pulmonic stenosis. Aorta: The aortic root is normal in size and structure. IAS/Shunts: No atrial level shunt detected by color flow Doppler. Additional Comments: 3D was performed not requiring image post processing on an independent workstation and was abnormal.  LEFT VENTRICLE PLAX 2D LVIDd:         7.70 cm LVIDs:         7.10 cm LV PW:          1.10 cm         3D Volume EF LV IVS:        1.00 cm         LV 3D EF:    Left                                             ventricul                                             ar LV Volumes (MOD)                            ejection LV vol d, MOD    389.0 ml                   fraction A2C:                                        by 3D LV vol d, MOD    419.0 ml                   volume is A4C:  23 %. LV vol s, MOD    334.0 ml A2C: LV vol s, MOD    346.0 ml      3D Volume EF: A4C:                           3D EF:        23 % LV SV MOD A2C:   55.0 ml       LV EDV:       344 ml LV SV MOD A4C:   419.0 ml      LV ESV:       265 ml LV SV MOD BP:    54.3 ml       LV SV:        79 ml RIGHT VENTRICLE          IVC RV Basal diam:  3.70 cm  IVC diam: 1.40 cm LEFT ATRIUM              Index        RIGHT ATRIUM           Index LA diam:        5.30 cm  2.52 cm/m   RA Area:     17.80 cm LA Vol (A2C):   132.0 ml 62.82 ml/m  RA Volume:   49.60 ml  23.60 ml/m LA Vol (A4C):   86.8 ml  41.31 ml/m LA Biplane Vol: 116.0 ml 55.20 ml/m   AORTA Ao Root diam: 2.90 cm MITRAL VALVE MV Area (PHT): 5.66 cm MV Decel Time: 134 msec MV E velocity: 104.00 cm/s MV A velocity: 59.40 cm/s MV E/A ratio:  1.75 Jules Oar MD Electronically signed by Jules Oar MD Signature Date/Time: 11/16/2023/2:42:34 PM    Final    DG Chest Port 1 View Result Date: 11/16/2023 CLINICAL DATA:  Shortness of breath CHF. EXAM: PORTABLE CHEST 1 VIEW COMPARISON:  PA and lateral chest 10/07/2023 FINDINGS: There is a left chest dual lead pacing system with a third AID wire and stable wire insertions. The heart is enlarged. There is central vascular prominence, mild interstitial edema in the lung bases and small pleural effusions. There are atelectatic bands in both lower lung fields, elevated right hemidiaphragm and no focal airspace disease process is seen. The mediastinum is stable. There is calcification of the  transverse aorta. No acute osseous findings. IMPRESSION: 1. Cardiomegaly with central vascular prominence, mild interstitial edema in the lung bases and small pleural effusions. Findings consistent with mild CHF or fluid overload. Similar findings on the prior study. 2. Atelectatic bands in both lower lung fields. 3. Aortic atherosclerosis. Electronically Signed   By: Denman Fischer M.D.   On: 11/16/2023 02:48    Family Communication: Discussed with patient at bedside, husband over phone. They understand and agree. All questions answered.  Disposition: Status is: Inpatient Remains inpatient appropriate because: Cardiology work up including right heart cath.  Planned Discharge Destination: Home     Time spent: 41 minutes  Author: Aisha Hove, MD 11/17/2023 2:54 PM Secure chat 7am to 7pm For on call review [email or web address].

## 2023-11-18 ENCOUNTER — Encounter (HOSPITAL_COMMUNITY): Payer: Self-pay | Admitting: Internal Medicine

## 2023-11-18 ENCOUNTER — Encounter (HOSPITAL_COMMUNITY)
Admission: EM | Disposition: A | Discharge status: Home Health | Payer: Self-pay | Source: Home / Self Care | Attending: Internal Medicine

## 2023-11-18 DIAGNOSIS — I5023 Acute on chronic systolic (congestive) heart failure: Secondary | ICD-10-CM | POA: Diagnosis not present

## 2023-11-18 DIAGNOSIS — E782 Mixed hyperlipidemia: Secondary | ICD-10-CM | POA: Diagnosis not present

## 2023-11-18 DIAGNOSIS — N179 Acute kidney failure, unspecified: Secondary | ICD-10-CM | POA: Diagnosis not present

## 2023-11-18 DIAGNOSIS — D649 Anemia, unspecified: Secondary | ICD-10-CM | POA: Diagnosis not present

## 2023-11-18 HISTORY — PX: RIGHT HEART CATH: CATH118263

## 2023-11-18 LAB — POCT I-STAT EG7
Acid-Base Excess: 1 mmol/L (ref 0.0–2.0)
Acid-Base Excess: 2 mmol/L (ref 0.0–2.0)
Bicarbonate: 26.5 mmol/L (ref 20.0–28.0)
Bicarbonate: 27.2 mmol/L (ref 20.0–28.0)
Calcium, Ion: 1.21 mmol/L (ref 1.15–1.40)
Calcium, Ion: 1.24 mmol/L (ref 1.15–1.40)
HCT: 29 % — ABNORMAL LOW (ref 36.0–46.0)
HCT: 30 % — ABNORMAL LOW (ref 36.0–46.0)
Hemoglobin: 10.2 g/dL — ABNORMAL LOW (ref 12.0–15.0)
Hemoglobin: 9.9 g/dL — ABNORMAL LOW (ref 12.0–15.0)
O2 Saturation: 45 %
O2 Saturation: 45 %
Potassium: 4 mmol/L (ref 3.5–5.1)
Potassium: 4 mmol/L (ref 3.5–5.1)
Sodium: 139 mmol/L (ref 135–145)
Sodium: 140 mmol/L (ref 135–145)
TCO2: 28 mmol/L (ref 22–32)
TCO2: 28 mmol/L (ref 22–32)
pCO2, Ven: 43 mmHg — ABNORMAL LOW (ref 44–60)
pCO2, Ven: 43 mmHg — ABNORMAL LOW (ref 44–60)
pH, Ven: 7.397 (ref 7.25–7.43)
pH, Ven: 7.409 (ref 7.25–7.43)
pO2, Ven: 25 mmHg — CL (ref 32–45)
pO2, Ven: 25 mmHg — CL (ref 32–45)

## 2023-11-18 LAB — URINE CULTURE: Culture: >=100000 — AB

## 2023-11-18 LAB — BASIC METABOLIC PANEL WITH GFR
Anion gap: 8 (ref 5–15)
BUN: 26 mg/dL — ABNORMAL HIGH (ref 8–23)
CO2: 26 mmol/L (ref 22–32)
Calcium: 8.8 mg/dL — ABNORMAL LOW (ref 8.9–10.3)
Chloride: 103 mmol/L (ref 98–111)
Creatinine, Ser: 1.19 mg/dL — ABNORMAL HIGH (ref 0.44–1.00)
GFR, Estimated: 52 mL/min — ABNORMAL LOW (ref >60)
Glucose, Bld: 111 mg/dL — ABNORMAL HIGH (ref 70–99)
Potassium: 4 mmol/L (ref 3.5–5.1)
Sodium: 137 mmol/L (ref 135–145)

## 2023-11-18 LAB — MAGNESIUM: Magnesium: 2.3 mg/dL (ref 1.7–2.4)

## 2023-11-18 SURGERY — RIGHT HEART CATH
Anesthesia: LOCAL

## 2023-11-18 MED ORDER — SODIUM CHLORIDE 0.9 % IV SOLN: 250.0000 mL | INTRAVENOUS | Status: AC | PRN

## 2023-11-18 MED ORDER — ACETAMINOPHEN 325 MG PO TABS: 650.0000 mg | ORAL_TABLET | ORAL | Status: DC | PRN

## 2023-11-18 MED ORDER — POTASSIUM CHLORIDE CRYS ER 20 MEQ PO TBCR
40.0000 meq | EXTENDED_RELEASE_TABLET | Freq: Once | ORAL | Status: AC
  Administered 2023-11-18: 40 meq via ORAL
  Filled 2023-11-18: qty 2

## 2023-11-18 MED ORDER — ONDANSETRON HCL 4 MG/2ML IJ SOLN: 4.0000 mg | Freq: Four times a day (QID) | INTRAMUSCULAR | Status: DC | PRN

## 2023-11-18 MED ORDER — LIDOCAINE HCL (PF) 1 % IJ SOLN
INTRAMUSCULAR | Status: AC
  Filled 2023-11-18: qty 30

## 2023-11-18 MED ORDER — HYDRALAZINE HCL 20 MG/ML IJ SOLN: 10.0000 mg | INTRAMUSCULAR | Status: AC | PRN

## 2023-11-18 MED ORDER — LABETALOL HCL 5 MG/ML IV SOLN: 10.0000 mg | INTRAVENOUS | Status: AC | PRN

## 2023-11-18 MED ORDER — SODIUM CHLORIDE 0.9% FLUSH: 3.0000 mL | INTRAVENOUS | Status: DC | PRN

## 2023-11-18 MED ORDER — LIDOCAINE HCL (PF) 1 % IJ SOLN
INTRAMUSCULAR | Status: DC | PRN
  Administered 2023-11-18: 5 mL

## 2023-11-18 MED ORDER — FUROSEMIDE 10 MG/ML IJ SOLN
80.0000 mg | Freq: Two times a day (BID) | INTRAMUSCULAR | Status: DC
  Administered 2023-11-18 – 2023-11-20 (×4): 80 mg via INTRAVENOUS
  Filled 2023-11-18 (×4): qty 8

## 2023-11-18 MED ORDER — HEPARIN (PORCINE) IN NACL 1000-0.9 UT/500ML-% IV SOLN
INTRAVENOUS | Status: DC | PRN
  Administered 2023-11-18: 500 mL

## 2023-11-18 MED ORDER — SODIUM CHLORIDE 0.9% FLUSH
3.0000 mL | Freq: Two times a day (BID) | INTRAVENOUS | Status: DC
  Administered 2023-11-18 – 2023-11-21 (×7): 3 mL via INTRAVENOUS

## 2023-11-18 SURGICAL SUPPLY — 6 items
CATH SWAN GANZ 7F STRAIGHT (CATHETERS) IMPLANT
GLIDESHEATH SLENDER 7FR .021G (SHEATH) IMPLANT
KIT MICROPUNCTURE NIT STIFF (SHEATH) IMPLANT
PACK CARDIAC CATHETERIZATION (CUSTOM PROCEDURE TRAY) ×1 IMPLANT
SHEATH PROBE COVER 6X72 (BAG) IMPLANT
WIRE EMERALD 3MM-J .025X260CM (WIRE) IMPLANT

## 2023-11-18 NOTE — Progress Notes (Signed)
 Advanced Heart Failure Rounding Note   Subjective:    Lasix  held yesterday as she was felt to be euvolemic  Denies CP or SOB this morning   Objective:   Weight Range:  Vital Signs:   Temp:  [97.5 F (36.4 C)-98.6 F (37 C)] 97.7 F (36.5 C) (05/19 0456) Pulse Rate:  [97] 97 (05/19 0456) Resp:  [18-20] 20 (05/19 0456) BP: (89-105)/(65-73) 105/73 (05/19 0456) SpO2:  [97 %-100 %] 97 % (05/19 0456) Weight:  [82.6 kg] 82.6 kg (05/19 0456) Last BM Date :  (PTA)  Weight change: Filed Weights   11/15/23 2302 11/17/23 0519 11/18/23 0456  Weight: 83.8 kg 81.6 kg 82.6 kg    Intake/Output:   Intake/Output Summary (Last 24 hours) at 11/18/2023 0859 Last data filed at 11/17/2023 1850 Gross per 24 hour  Intake 720 ml  Output --  Net 720 ml     Physical Exam: General:  Sitting up in bed No resp difficulty HEENT: normal Neck: supple. no JVD. Carotids 2+ bilat; no bruits. No lymphadenopathy or thryomegaly appreciated. Cor: PMI nondisplaced. Regular rate & rhythm. No rubs, gallops or murmurs. Lungs: clear Abdomen: soft, nontender, nondistended. No hepatosplenomegaly. No bruits or masses. Good bowel sounds. Extremities: no cyanosis, clubbing, rash, edema Neuro: alert & orientedx3, cranial nerves grossly intact. moves all 4 extremities w/o difficulty. Affect pleasant  Telemetry: Sinus 90s Personally reviewed   Labs: Basic Metabolic Panel: Recent Labs  Lab 11/15/23 2315 11/16/23 0518 11/18/23 0544  NA 140 138 137  K 3.7 3.5 4.0  CL 103 100 103  CO2 25 24 26   GLUCOSE 98 97 111*  BUN 21 21 26*  CREATININE 1.26* 1.25* 1.19*  CALCIUM  9.5 9.4 8.8*  MG  --  2.0  --   PHOS  --  3.9  --     Liver Function Tests: Recent Labs  Lab 11/15/23 2315  AST 32  ALT 38  ALKPHOS 53  BILITOT 1.3*  PROT 6.9  ALBUMIN 3.7   Recent Labs  Lab 11/15/23 2315  LIPASE 44   No results for input(s): "AMMONIA" in the last 168 hours.  CBC: Recent Labs  Lab 11/15/23 2315  11/16/23 1458  WBC 5.1 4.4  HGB 9.7* 9.8*  HCT 32.0* 31.3*  MCV 88.9 87.4  PLT 234 243    Cardiac Enzymes: No results for input(s): "CKTOTAL", "CKMB", "CKMBINDEX", "TROPONINI" in the last 168 hours.  BNP: BNP (last 3 results) Recent Labs    10/10/23 0222 10/29/23 1036 11/16/23 0241  BNP 373.5* 639.2* 970.4*    ProBNP (last 3 results) No results for input(s): "PROBNP" in the last 8760 hours.    Other results:  Imaging: ECHOCARDIOGRAM LIMITED Result Date: 11/16/2023    ECHOCARDIOGRAM LIMITED REPORT   Patient Name:   Lynn Estrada Date of Exam: 11/16/2023 Medical Rec #:  [id number]         Height:       74.0 in Accession #:    [id number]        Weight:       184.7 lb Date of Birth:  03-May-1961        BSA:          2.101 m Patient Age:    62 years          BP:           126/77 mmHg Patient Gender: F  HR:           92 bpm. Exam Location:  Inpatient Procedure: 2D Echo, 3D Echo, Cardiac Doppler, Limited Color Doppler, Limited            Echo and Intracardiac Opacification Agent (Both Spectral and Color            Flow Doppler were utilized during procedure). Indications:    CHF-Acute Systolic  History:        Patient has prior history of Echocardiogram examinations, most                 recent 11/05/2023. CHF, Stroke, Arrythmias:LBBB,                 Signs/Symptoms:Shortness of Breath; Risk Factors:Hypertension                 and Former Smoker.  Sonographer:    Travis Friedman Referring Phys: [id number] CAROLE N HALL IMPRESSIONS  1. Left ventricular ejection fraction, by estimation, is <20%. Left ventricular ejection fraction by 3D volume is 23 %. The left ventricle has severely decreased function. The left ventricle demonstrates global hypokinesis. The left ventricular internal  cavity size was severely dilated. Left ventricular diastolic parameters are indeterminate.  2. Right ventricular systolic function is mildly reduced. The right ventricular size is normal.  3. Left atrial size  was severely dilated.  4. Right atrial size was mildly dilated.  5. A small pericardial effusion is present. The pericardial effusion is posterior to the left ventricle. There is no evidence of cardiac tamponade.  6. The mitral valve is normal in structure. Mild mitral valve regurgitation. No evidence of mitral stenosis.  7. The aortic valve is normal in structure. Aortic valve regurgitation is trivial. No aortic stenosis is present. FINDINGS  Left Ventricle: Left ventricular ejection fraction, by estimation, is <20%. Left ventricular ejection fraction by 3D volume is 23 %. The left ventricle has severely decreased function. The left ventricle demonstrates global hypokinesis. Definity  contrast agent was given IV to delineate the left ventricular endocardial borders. The left ventricular internal cavity size was severely dilated. There is no left ventricular hypertrophy. Left ventricular diastolic parameters are indeterminate. Right Ventricle: The right ventricular size is normal. No increase in right ventricular wall thickness. Right ventricular systolic function is mildly reduced. Left Atrium: Left atrial size was severely dilated. Right Atrium: Right atrial size was mildly dilated. Pericardium: A small pericardial effusion is present. The pericardial effusion is posterior to the left ventricle. There is no evidence of cardiac tamponade. Mitral Valve: The mitral valve is normal in structure. Mild mitral valve regurgitation. No evidence of mitral valve stenosis. Tricuspid Valve: The tricuspid valve is normal in structure. Tricuspid valve regurgitation is not demonstrated. No evidence of tricuspid stenosis. Aortic Valve: The aortic valve is normal in structure. Aortic valve regurgitation is trivial. No aortic stenosis is present. Pulmonic Valve: The pulmonic valve was normal in structure. Pulmonic valve regurgitation is not visualized. No evidence of pulmonic stenosis. Aorta: The aortic root is normal in size and  structure. IAS/Shunts: No atrial level shunt detected by color flow Doppler. Additional Comments: 3D was performed not requiring image post processing on an independent workstation and was abnormal.  LEFT VENTRICLE PLAX 2D LVIDd:         7.70 cm LVIDs:         7.10 cm LV PW:         1.10 cm         3D Volume  EF LV IVS:        1.00 cm         LV 3D EF:    Left                                             ventricul                                             ar LV Volumes (MOD)                            ejection LV vol d, MOD    389.0 ml                   fraction A2C:                                        by 3D LV vol d, MOD    419.0 ml                   volume is A4C:                                        23 %. LV vol s, MOD    334.0 ml A2C: LV vol s, MOD    346.0 ml      3D Volume EF: A4C:                           3D EF:        23 % LV SV MOD A2C:   55.0 ml       LV EDV:       344 ml LV SV MOD A4C:   419.0 ml      LV ESV:       265 ml LV SV MOD BP:    54.3 ml       LV SV:        79 ml RIGHT VENTRICLE          IVC RV Basal diam:  3.70 cm  IVC diam: 1.40 cm LEFT ATRIUM              Index        RIGHT ATRIUM           Index LA diam:        5.30 cm  2.52 cm/m   RA Area:     17.80 cm LA Vol (A2C):   132.0 ml 62.82 ml/m  RA Volume:   49.60 ml  23.60 ml/m LA Vol (A4C):   86.8 ml  41.31 ml/m LA Biplane Vol: 116.0 ml 55.20 ml/m   AORTA Ao Root diam: 2.90 cm MITRAL VALVE MV Area (PHT): 5.66 cm MV Decel Time: 134 msec MV E velocity: 104.00 cm/s MV A velocity: 59.40 cm/s MV E/A ratio:  1.75 Jules Oar MD Electronically signed by Jules Oar MD Signature Date/Time: 11/16/2023/2:42:34 PM    Final  Medications:     Scheduled Medications:  apixaban   5 mg Oral BID   atorvastatin   10 mg Oral Daily   midodrine   5 mg Oral TID WC    Infusions:   PRN Medications: acetaminophen , benzonatate , melatonin, polyethylene glycol, prochlorperazine    Assessment/Plan   1. Acute on chronic Combined  Systolic/Diastolic Heart Failure, NICM - LHC 1/23 with min CAD. cMRI possible myocarditis  LVEF 18% Suggestive of LBBB, RVEF 33% - Echo 4/23: EF 30-35% -> EF improving but still with significant LV dysfunction (suspect resolving myocarditis) - Echo 10/23. EF 20-25% with nl RV - s/p Abbott CRT-D 7/24 for LBBB  - Echo 2/25 EF 20-25% RV ok  - Echo 11/16/23 EF < 20% RV mildly HK - RHC 10/11/23: RA 7, PA 51/30 (38), PCWP 31, CO/CI(TD) 4.44/2.12=>>diuresed w/ IV Lasix . D/c wt 182 lb  - Admitted 3/25 and 4/25 with ADHF - Admitted to [facility] 4/25 with AK due to overdiuresis. Hydrated and GDMT held - Now readmitted with mild volume overload - Has diuresed well. She appears euvolemic  - Continue Jardiance  10 - Off spiro and Entresto  and digoxin  with low BP and previous AKI - Continue midodrine  for BP support - She has advanced /end-stage HF. Will have to decide if she is candidate for advanced therapies - main concern has been debility. Plan RHC today.  2. PE - diagnosed 3/25, small burden RLL. LE venous dopplers + for SVT. No DVT   - Continue Eliquis . No bleeding.  - Ok to continue pre cath   3 LBBB - LBBB, QRS 176 ms - s/p Abbott CRT-D 7/24   4. Partial R Nephrectomy with recent AKI due to overdiuresis - b/l SCr 1.1  - Scr stable at 1.19 today.  - Follow with diuresis   5. H/o CVA 11/24 - no significant residual deficits  - on statin per Neuro  - Continue Eliquis    6. Hypokalemia - K 4.0 after supp   Length of Stay: 2   Jules Oar MD 11/18/2023, 8:59 AM  Advanced Heart Failure Team Pager [phone] (M-F; 7a - 4p)  Please contact [facility] for night-coverage after hours (4p -7a ) and weekends on [email or web address]

## 2023-11-18 NOTE — Progress Notes (Signed)
 Progress Note   Patient: SHAQUETTA ARCOS ZOX:[id number] DOB: Nov 16, 1960 DOA: 11/15/2023     2 DOS: the patient was seen and examined on 11/18/2023   Brief hospital course: Milaina Sher Staniszewski is a 63 y.o. female with medical history significant for chronic HFrEF with LVEF 20% status post biventricular pacemaker placement, pulmonary hypertension, pericardial effusion seen on 2D echo done on 11/05/2023, type 2 diabetes, history of CVA, history of PE on Eliquis , who presented to the ER with complaints of 2 days of progressive dyspnea.  Worse with ambulation.  Associated with bilateral lower extremity edema and 5 pounds weight gain in the past 2 days.  States she has been taking her home diuretic, torsemide , only twice a week because it makes her sick and nauseous.  Patient is admitted to the TRH service for further management evaluation of CHF exacerbation with heart failure team consultation.  Assessment and Plan: Acute on chronic combined systolic and diastolic heart failure: Patient presented with shortness of breath, leg edema, weight gain. BNP 970.  Echocardiogram reviewed showed EF less than 20%. Heart failure team evaluation and follow up appreciated. Patient is on IV Lasix  80 mg twice daily per heart failure team. Recent [facility] admission for AKI, Entresto , digoxin , spironolactone  is held.  BP lower side noted.  Continue Jardiance . S/p RHC 11/18/23 decreased cardiac output, high right sided pressures. Heart failure team to discuss further about advanced therapy for her heart failure.  Acute kidney injury- Kidney function stable. Continue to hold diuresis at this time. Avoid nephrotoxic drugs. Monitor daily renal function.  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  Chronic normocytic anemia- Iron studies reviewed unremarkable.  Hemoglobin stable.  No active bleeding.  Hyperlipidemia-continue Lipitor.        Out of bed to chair. Incentive spirometry. Nursing supportive  care. Fall, aspiration precautions. Diet:  Diet Orders (From admission, onward)     Start     Ordered   11/18/23 1008  Diet Heart Room service appropriate? Yes; Fluid consistency: Thin  Diet effective now       Question Answer Comment  Room service appropriate? Yes   Fluid consistency: Thin      11/18/23 1007           DVT prophylaxis:  apixaban  (ELIQUIS ) tablet 5 mg  Level of care: Telemetry Cardiac   Code Status: Full Code  Subjective: Patient is seen and examined today after heart cath procedure. She is sitting in chair. Says she feels better. She did get information packet about LVAD therapy. Wishes to have discussion with family.  Physical Exam: Vitals:   11/18/23 0950 11/18/23 1129 11/18/23 1130 11/18/23 1541  BP: (!) 119/90  100/67 118/83  Pulse: (!) 103  89   Resp: (!) 27  20 19   Temp:  97.7 F (36.5 C) 97.7 F (36.5 C) 98.5 F (36.9 C)  TempSrc:   Oral Oral  SpO2: 96%  99%   Weight:      Height:        General - Elderly African American female, no apparent distress HEENT - PERRLA, EOMI, atraumatic head, non tender sinuses. Lung - Clear, basal rales, no rhonchi, wheezes. Heart - S1, S2 heard, no murmurs, rubs, trace pedal edema. Abdomen - Soft, non tender, bowel sounds good Neuro - Alert, awake and oriented x 3, non focal exam. Skin - Warm and dry.  Data Reviewed:      Latest Ref Rng & Units 11/18/2023    9:43 AM 11/16/2023  2:58 PM 11/15/2023   11:15 PM  CBC  WBC 4.0 - 10.5 K/uL  4.4  5.1   Hemoglobin 12.0 - 15.0 g/dL 21.3 - 08.6 g/dL 9.9    57.8  9.8  9.7   Hematocrit 36.0 - 46.0 % 36.0 - 46.0 % 29.0    30.0  31.3  32.0   Platelets 150 - 400 K/uL  243  234       Latest Ref Rng & Units 11/18/2023    9:43 AM 11/18/2023    5:44 AM 11/16/2023    5:18 AM  BMP  Glucose 70 - 99 mg/dL  469  97   BUN 8 - 23 mg/dL  26  21   Creatinine 6.29 - 1.00 mg/dL  5.28  4.13   Sodium 244 - 145 mmol/L 135 - 145 mmol/L 140    139  137  138   Potassium 3.5  - 5.1 mmol/L 3.5 - 5.1 mmol/L 4.0    4.0  4.0  3.5   Chloride 98 - 111 mmol/L  103  100   CO2 22 - 32 mmol/L  26  24   Calcium  8.9 - 10.3 mg/dL  8.8  9.4    CARDIAC CATHETERIZATION Result Date: 11/18/2023 Findings: RA = 13 RV = 62/16 PA = 59/34 (48) PCW = 33 Fick cardiac output/index = 4.0/1.9 Thermo CO/CI = 3.8/1.8 PVR = 3.75 (Fick) 4.0 (TD) Ao sat = 97% PA sat = 45%, 45% PAPi = 1.9 Assessment: 1. Markedly elevated filling pressures with severely decrease cardiac output 2. 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 Jules Oar, MD 10:01 AM   Family Communication: Discussed with patient at bedside, husband over phone. They understand and agree. All questions answered.  Disposition: Status is: Inpatient Remains inpatient appropriate because: advanced therapy discussions.  Planned Discharge Destination: Home     Time spent: 39 minutes  Author: Aisha Hove, MD 11/18/2023 3:57 PM Secure chat 7am to 7pm For on call review [email or web address].

## 2023-11-18 NOTE — Interval H&P Note (Signed)
 History and Physical Interval Note:  11/18/2023 9:02 AM  Daana D Younis  has presented today for surgery, with the diagnosis of hf.  The various methods of treatment have been discussed with the patient and family. After consideration of risks, benefits and other options for treatment, the patient has consented to  Procedure(s): RIGHT HEART CATH (N/A) as a surgical intervention.  The patient's history has been reviewed, patient examined, no change in status, stable for surgery.  I have reviewed the patient's chart and labs.  Questions were answered to the patient's satisfaction.     Tayleigh Wetherell

## 2023-11-18 NOTE — Progress Notes (Signed)
 [facility] EDUCATION NOTE:                VAD educational packet including "Understanding Your Options with Advanced Heart Failure", "[employer] Patient Agreement for VAD Evaluation and Potential Implantation" consent, and [employer] "Heartmate 3 Left Ventricular Device (LVAD) Patient Guide", Heartmate 3 Left Ventricular Assist System Patient Education Program DVD", "[employer] HM III Patient Education", "[employer] [place] Mechanical Circulatory Support Program", and "Decision Aids for Left Ventricular Assist Device" reviewed in detail and left at bedside for continued reference.  All questions answered regarding VAD implant, hospital stay, and what to expect when discharged home living with a heart pump.    Explained need for 24/7 care when pt is discharged home due to sternal precautions, adaptation to living on support, emotional support, consistent and meticulous exit site care and management, medication adherence and high volume of follow up visits with the VAD Clinic after discharge. Pt states she will speak with her family to see who will be willing to fulfill this role for her.  Explained that LVAD can be implanted for two indications in the setting of advanced left ventricular heart failure treatment:  Bridge to transplant - used for patients who cannot safely wait for heart transplant without this device.  Or    Destination therapy - used for patients until end of life or recovery of heart function.  Identified the following lifestyle modifications while living on MCS:    1. No driving for at least three months and then only if doctor gives permission to do so.   2. No tub baths while pump implanted, and shower only when doctor gives permission.   3. No swimming or submersion in water while implanted with pump.   4. No contact sports or engaging in jumping activities.   5. Always have a backup controller, charged spare batteries, and battery clips nearby at all times in case of emergency.   6. Call  the doctor or hospital contact person if any change in how the pump sounds, feels, or works.   7. Plan to sleep only when connected to the power module.   8. Do not sleep on your stomach.   9. Keep a backup system controller, charged batteries, battery clips, and flashlight near you during sleep in case of electrical power outage.   10. Exit site care including dressing changes, monitoring for infection, and importance of keeping percutaneous lead stabilized at all times.     Reviewed pictures of VAD drive line, site care, dressing changes, and drive line stabilization including securement attachment device and abdominal binder. Discussed with pt and family that they will be required to purchase dressing supplies as long as patient has the VAD in place.   The patient understands that from this discussion it does not mean that they will receive the device, but that depends on an extensive evaluation process. The patient is aware of the fact that if at anytime they want to stop the evaluation process they can.  All questions have been answered at this time and contact information was provided should they encounter any further questions. Pt wishes to continue conversation tomorrow. She would like to speak with her family before moving forward with the decision to start evaluation.   Pt's daughter is getting married in September and she wishes to be there and well. She states she would like to have a conversation with her Cardiologist at [facility] as well for a second opinion.  VAD Coordinator will return tomorrow  to continue conversation about advanced therapies.  Laurice Pope RN, BSN VAD Coordinator 24/7 Pager [phone]

## 2023-11-18 NOTE — Plan of Care (Signed)
   Problem: Education: Goal: Knowledge of General Education information will improve Description Including pain rating scale, medication(s)/side effects and non-pharmacologic comfort measures Outcome: Progressing   Problem: Health Behavior/Discharge Planning: Goal: Ability to manage health-related needs will improve Outcome: Progressing

## 2023-11-18 NOTE — H&P (View-Only) (Signed)
 Advanced Heart Failure Rounding Note   Subjective:    Lasix  held yesterday as she was felt to be euvolemic  Denies CP or SOB this morning   Objective:   Weight Range:  Vital Signs:   Temp:  [97.5 F (36.4 C)-98.6 F (37 C)] 97.7 F (36.5 C) (05/19 0456) Pulse Rate:  [97] 97 (05/19 0456) Resp:  [18-20] 20 (05/19 0456) BP: (89-105)/(65-73) 105/73 (05/19 0456) SpO2:  [97 %-100 %] 97 % (05/19 0456) Weight:  [82.6 kg] 82.6 kg (05/19 0456) Last BM Date :  (PTA)  Weight change: Filed Weights   11/15/23 2302 11/17/23 0519 11/18/23 0456  Weight: 83.8 kg 81.6 kg 82.6 kg    Intake/Output:   Intake/Output Summary (Last 24 hours) at 11/18/2023 0859 Last data filed at 11/17/2023 1850 Gross per 24 hour  Intake 720 ml  Output --  Net 720 ml     Physical Exam: General:  Sitting up in bed No resp difficulty HEENT: normal Neck: supple. no JVD. Carotids 2+ bilat; no bruits. No lymphadenopathy or thryomegaly appreciated. Cor: PMI nondisplaced. Regular rate & rhythm. No rubs, gallops or murmurs. Lungs: clear Abdomen: soft, nontender, nondistended. No hepatosplenomegaly. No bruits or masses. Good bowel sounds. Extremities: no cyanosis, clubbing, rash, edema Neuro: alert & orientedx3, cranial nerves grossly intact. moves all 4 extremities w/o difficulty. Affect pleasant  Telemetry: Sinus 90s Personally reviewed   Labs: Basic Metabolic Panel: Recent Labs  Lab 11/15/23 2315 11/16/23 0518 11/18/23 0544  NA 140 138 137  K 3.7 3.5 4.0  CL 103 100 103  CO2 25 24 26   GLUCOSE 98 97 111*  BUN 21 21 26*  CREATININE 1.26* 1.25* 1.19*  CALCIUM  9.5 9.4 8.8*  MG  --  2.0  --   PHOS  --  3.9  --     Liver Function Tests: Recent Labs  Lab 11/15/23 2315  AST 32  ALT 38  ALKPHOS 53  BILITOT 1.3*  PROT 6.9  ALBUMIN 3.7   Recent Labs  Lab 11/15/23 2315  LIPASE 44   No results for input(s): "AMMONIA" in the last 168 hours.  CBC: Recent Labs  Lab 11/15/23 2315  11/16/23 1458  WBC 5.1 4.4  HGB 9.7* 9.8*  HCT 32.0* 31.3*  MCV 88.9 87.4  PLT 234 243    Cardiac Enzymes: No results for input(s): "CKTOTAL", "CKMB", "CKMBINDEX", "TROPONINI" in the last 168 hours.  BNP: BNP (last 3 results) Recent Labs    10/10/23 0222 10/29/23 1036 11/16/23 0241  BNP 373.5* 639.2* 970.4*    ProBNP (last 3 results) No results for input(s): "PROBNP" in the last 8760 hours.    Other results:  Imaging: ECHOCARDIOGRAM LIMITED Result Date: 11/16/2023    ECHOCARDIOGRAM LIMITED REPORT   Patient Name:   CHARNIKA HERBST Vaquerano Date of Exam: 11/16/2023 Medical Rec #:  [id number]         Height:       74.0 in Accession #:    [id number]        Weight:       184.7 lb Date of Birth:  03-May-1961        BSA:          2.101 m Patient Age:    63 years          BP:           126/77 mmHg Patient Gender: F  HR:           92 bpm. Exam Location:  Inpatient Procedure: 2D Echo, 3D Echo, Cardiac Doppler, Limited Color Doppler, Limited            Echo and Intracardiac Opacification Agent (Both Spectral and Color            Flow Doppler were utilized during procedure). Indications:    CHF-Acute Systolic  History:        Patient has prior history of Echocardiogram examinations, most                 recent 11/05/2023. CHF, Stroke, Arrythmias:LBBB,                 Signs/Symptoms:Shortness of Breath; Risk Factors:Hypertension                 and Former Smoker.  Sonographer:    Travis Friedman Referring Phys: [id number] CAROLE N HALL IMPRESSIONS  1. Left ventricular ejection fraction, by estimation, is <20%. Left ventricular ejection fraction by 3D volume is 23 %. The left ventricle has severely decreased function. The left ventricle demonstrates global hypokinesis. The left ventricular internal  cavity size was severely dilated. Left ventricular diastolic parameters are indeterminate.  2. Right ventricular systolic function is mildly reduced. The right ventricular size is normal.  3. Left atrial size  was severely dilated.  4. Right atrial size was mildly dilated.  5. A small pericardial effusion is present. The pericardial effusion is posterior to the left ventricle. There is no evidence of cardiac tamponade.  6. The mitral valve is normal in structure. Mild mitral valve regurgitation. No evidence of mitral stenosis.  7. The aortic valve is normal in structure. Aortic valve regurgitation is trivial. No aortic stenosis is present. FINDINGS  Left Ventricle: Left ventricular ejection fraction, by estimation, is <20%. Left ventricular ejection fraction by 3D volume is 23 %. The left ventricle has severely decreased function. The left ventricle demonstrates global hypokinesis. Definity  contrast agent was given IV to delineate the left ventricular endocardial borders. The left ventricular internal cavity size was severely dilated. There is no left ventricular hypertrophy. Left ventricular diastolic parameters are indeterminate. Right Ventricle: The right ventricular size is normal. No increase in right ventricular wall thickness. Right ventricular systolic function is mildly reduced. Left Atrium: Left atrial size was severely dilated. Right Atrium: Right atrial size was mildly dilated. Pericardium: A small pericardial effusion is present. The pericardial effusion is posterior to the left ventricle. There is no evidence of cardiac tamponade. Mitral Valve: The mitral valve is normal in structure. Mild mitral valve regurgitation. No evidence of mitral valve stenosis. Tricuspid Valve: The tricuspid valve is normal in structure. Tricuspid valve regurgitation is not demonstrated. No evidence of tricuspid stenosis. Aortic Valve: The aortic valve is normal in structure. Aortic valve regurgitation is trivial. No aortic stenosis is present. Pulmonic Valve: The pulmonic valve was normal in structure. Pulmonic valve regurgitation is not visualized. No evidence of pulmonic stenosis. Aorta: The aortic root is normal in size and  structure. IAS/Shunts: No atrial level shunt detected by color flow Doppler. Additional Comments: 3D was performed not requiring image post processing on an independent workstation and was abnormal.  LEFT VENTRICLE PLAX 2D LVIDd:         7.70 cm LVIDs:         7.10 cm LV PW:         1.10 cm         3D Volume  EF LV IVS:        1.00 cm         LV 3D EF:    Left                                             ventricul                                             ar LV Volumes (MOD)                            ejection LV vol d, MOD    389.0 ml                   fraction A2C:                                        by 3D LV vol d, MOD    419.0 ml                   volume is A4C:                                        23 %. LV vol s, MOD    334.0 ml A2C: LV vol s, MOD    346.0 ml      3D Volume EF: A4C:                           3D EF:        23 % LV SV MOD A2C:   55.0 ml       LV EDV:       344 ml LV SV MOD A4C:   419.0 ml      LV ESV:       265 ml LV SV MOD BP:    54.3 ml       LV SV:        79 ml RIGHT VENTRICLE          IVC RV Basal diam:  3.70 cm  IVC diam: 1.40 cm LEFT ATRIUM              Index        RIGHT ATRIUM           Index LA diam:        5.30 cm  2.52 cm/m   RA Area:     17.80 cm LA Vol (A2C):   132.0 ml 62.82 ml/m  RA Volume:   49.60 ml  23.60 ml/m LA Vol (A4C):   86.8 ml  41.31 ml/m LA Biplane Vol: 116.0 ml 55.20 ml/m   AORTA Ao Root diam: 2.90 cm MITRAL VALVE MV Area (PHT): 5.66 cm MV Decel Time: 134 msec MV E velocity: 104.00 cm/s MV A velocity: 59.40 cm/s MV E/A ratio:  1.75 Jules Oar MD Electronically signed by Jules Oar MD Signature Date/Time: 11/16/2023/2:42:34 PM    Final  Medications:     Scheduled Medications:  apixaban   5 mg Oral BID   atorvastatin   10 mg Oral Daily   midodrine   5 mg Oral TID WC    Infusions:   PRN Medications: acetaminophen , benzonatate , melatonin, polyethylene glycol, prochlorperazine    Assessment/Plan   1. Acute on chronic Combined  Systolic/Diastolic Heart Failure, NICM - LHC 1/23 with min CAD. cMRI possible myocarditis  LVEF 18% Suggestive of LBBB, RVEF 33% - Echo 4/23: EF 30-35% -> EF improving but still with significant LV dysfunction (suspect resolving myocarditis) - Echo 10/23. EF 20-25% with nl RV - s/p Abbott CRT-D 7/24 for LBBB  - Echo 2/25 EF 20-25% RV ok  - Echo 11/16/23 EF < 20% RV mildly HK - RHC 10/11/23: RA 7, PA 51/30 (38), PCWP 31, CO/CI(TD) 4.44/2.12=>>diuresed w/ IV Lasix . D/c wt 182 lb  - Admitted 3/25 and 4/25 with ADHF - Admitted to [facility] 4/25 with AK due to overdiuresis. Hydrated and GDMT held - Now readmitted with mild volume overload - Has diuresed well. She appears euvolemic  - Continue Jardiance  10 - Off spiro and Entresto  and digoxin  with low BP and previous AKI - Continue midodrine  for BP support - She has advanced /end-stage HF. Will have to decide if she is candidate for advanced therapies - main concern has been debility. Plan RHC today.  2. PE - diagnosed 3/25, small burden RLL. LE venous dopplers + for SVT. No DVT   - Continue Eliquis . No bleeding.  - Ok to continue pre cath   3 LBBB - LBBB, QRS 176 ms - s/p Abbott CRT-D 7/24   4. Partial R Nephrectomy with recent AKI due to overdiuresis - b/l SCr 1.1  - Scr stable at 1.19 today.  - Follow with diuresis   5. H/o CVA 11/24 - no significant residual deficits  - on statin per Neuro  - Continue Eliquis    6. Hypokalemia - K 4.0 after supp   Length of Stay: 2   Jules Oar MD 11/18/2023, 8:59 AM  Advanced Heart Failure Team Pager [phone] (M-F; 7a - 4p)  Please contact [facility] for night-coverage after hours (4p -7a ) and weekends on [email or web address]

## 2023-11-18 NOTE — Plan of Care (Signed)

## 2023-11-19 ENCOUNTER — Encounter (HOSPITAL_COMMUNITY)

## 2023-11-19 DIAGNOSIS — I5023 Acute on chronic systolic (congestive) heart failure: Secondary | ICD-10-CM | POA: Diagnosis not present

## 2023-11-19 LAB — BASIC METABOLIC PANEL WITH GFR
Anion gap: 9 (ref 5–15)
BUN: 22 mg/dL (ref 8–23)
CO2: 28 mmol/L (ref 22–32)
Calcium: 9.2 mg/dL (ref 8.9–10.3)
Chloride: 105 mmol/L (ref 98–111)
Creatinine, Ser: 1.14 mg/dL — ABNORMAL HIGH (ref 0.44–1.00)
GFR, Estimated: 55 mL/min — ABNORMAL LOW (ref >60)
Glucose, Bld: 118 mg/dL — ABNORMAL HIGH (ref 70–99)
Potassium: 4 mmol/L (ref 3.5–5.1)
Sodium: 142 mmol/L (ref 135–145)

## 2023-11-19 MED ORDER — IRON SUCROSE 500 MG IVPB - SIMPLE MED
500.0000 mg | INTRAVENOUS | Status: DC
  Filled 2023-11-19: qty 275

## 2023-11-19 MED ORDER — POTASSIUM CHLORIDE CRYS ER 20 MEQ PO TBCR
40.0000 meq | EXTENDED_RELEASE_TABLET | Freq: Once | ORAL | Status: AC
  Administered 2023-11-19: 40 meq via ORAL
  Filled 2023-11-19: qty 2

## 2023-11-19 MED ORDER — SENNOSIDES-DOCUSATE SODIUM 8.6-50 MG PO TABS
2.0000 | ORAL_TABLET | Freq: Every day | ORAL | Status: DC
  Administered 2023-11-20: 2 via ORAL
  Filled 2023-11-19 (×2): qty 2

## 2023-11-19 MED ORDER — SODIUM CHLORIDE 0.9 % IV SOLN
500.0000 mg | INTRAVENOUS | Status: AC
  Administered 2023-11-19 – 2023-11-20 (×2): 500 mg via INTRAVENOUS
  Filled 2023-11-19 (×2): qty 25

## 2023-11-19 NOTE — Progress Notes (Addendum)
 Advanced Heart Failure Rounding Note   Subjective:    RHC 05/19: Markedly elevated filling pressures, moderate severe mixed PH and severely decreased CO with Fick CI 1.9 and TD CI 1.8.  Only 1700 UOP charted last 24 hrs with IV lasix  but weight down 5 lb.   No dyspnea at rest, gets short of breath with exertion. Overwhelmed by the thought of open heart surgery.   Starting workup to see if she is candidate for advanced therapies.     Objective:   Weight Range:  Vital Signs:   Temp:  [97.7 F (36.5 C)-98.5 F (36.9 C)] 98.4 F (36.9 C) (05/20 0507) Pulse Rate:  [89-106] 100 (05/20 0507) Resp:  [18-31] 18 (05/20 0507) BP: (98-121)/(53-91) 112/81 (05/20 0507) SpO2:  [85 %-99 %] 96 % (05/20 0507) Weight:  [81.1 kg] 81.1 kg (05/20 0507) Last BM Date : 11/17/23  Weight change: Filed Weights   11/17/23 0519 11/18/23 0456 11/19/23 0507  Weight: 81.6 kg 82.6 kg 81.1 kg    Intake/Output:   Intake/Output Summary (Last 24 hours) at 11/19/2023 0713 Last data filed at 11/19/2023 0513 Gross per 24 hour  Intake 1120 ml  Output 1700 ml  Net -580 ml     Physical Exam: General:  Fatigued appearing. Sitting up in bed Neck: JVP to midneck Cor: Regular rate & rhythm. No rubs, gallops or murmurs. Lungs: clear Abdomen: soft, nontender, nondistended.  Extremities: no edema Neuro: alert & orientedx3. Affect pleasant  Telemetry: V paced 90s-100s, occasional PVCs  Labs: Basic Metabolic Panel: Recent Labs  Lab 11/15/23 2315 11/16/23 0518 11/18/23 0544 11/18/23 0943  NA 140 138 137 139  140  K 3.7 3.5 4.0 4.0  4.0  CL 103 100 103  --   CO2 25 24 26   --   GLUCOSE 98 97 111*  --   BUN 21 21 26*  --   CREATININE 1.26* 1.25* 1.19*  --   CALCIUM  9.5 9.4 8.8*  --   MG  --  2.0 2.3  --   PHOS  --  3.9  --   --     Liver Function Tests: Recent Labs  Lab 11/15/23 2315  AST 32  ALT 38  ALKPHOS 53  BILITOT 1.3*  PROT 6.9  ALBUMIN 3.7   Recent Labs  Lab  11/15/23 2315  LIPASE 44   No results for input(s): "AMMONIA" in the last 168 hours.  CBC: Recent Labs  Lab 11/15/23 2315 11/16/23 1458 11/18/23 0943  WBC 5.1 4.4  --   HGB 9.7* 9.8* 10.2*  9.9*  HCT 32.0* 31.3* 30.0*  29.0*  MCV 88.9 87.4  --   PLT 234 243  --     Cardiac Enzymes: No results for input(s): "CKTOTAL", "CKMB", "CKMBINDEX", "TROPONINI" in the last 168 hours.  BNP: BNP (last 3 results) Recent Labs    10/10/23 0222 10/29/23 1036 11/16/23 0241  BNP 373.5* 639.2* 970.4*    ProBNP (last 3 results) No results for input(s): "PROBNP" in the last 8760 hours.    Other results:  Imaging: CARDIAC CATHETERIZATION Result Date: 11/18/2023 Findings: RA = 13 RV = 62/16 PA = 59/34 (48) PCW = 33 Fick cardiac output/index = 4.0/1.9 Thermo CO/CI = 3.8/1.8 PVR = 3.75 (Fick) 4.0 (TD) Ao sat = 97% PA sat = 45%, 45% PAPi = 1.9 Assessment: 1. Markedly elevated filling pressures with severely decrease cardiac output 2. 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 Swan Zayed, MD 10:01 AM    Medications:     Scheduled Medications:  apixaban   5 mg Oral BID   atorvastatin   10 mg Oral Daily   furosemide   80 mg Intravenous BID   midodrine   5 mg Oral TID WC   sodium chloride  flush  3 mL Intravenous Q12H    Infusions:  sodium chloride       PRN Medications: sodium chloride , acetaminophen , benzonatate , melatonin, ondansetron  (ZOFRAN ) IV, polyethylene glycol, prochlorperazine , sodium chloride  flush   Assessment/Plan   1. Acute on chronic Combined Systolic/Diastolic Heart Failure, NICM - LHC 1/23 with min CAD. cMRI possible myocarditis  LVEF 18% Suggestive of LBBB, RVEF 33% - Echo 4/23: EF 30-35% -> EF improving but still with significant LV dysfunction (suspect resolving myocarditis) - Echo 10/23. EF 20-25% with nl RV - s/p Abbott CRT-D 7/24 for LBBB  - Echo 2/25 EF 20-25% RV ok  -  RHC 10/11/23: RA 7, PA 51/30 (38), PCWP 31, CO/CI(TD) 4.44/2.12 - Admitted 3/25 and 4/25 with ADHF - Admitted to [facility] 4/25 with AK due to overdiuresis. Hydrated and GDMT held - Echo 11/16/23 EF < 20% RV mildly HK - RHC this admit: RA mean 13, PA 59/34 (48), PCWP 33, Fick CI 1.9, TD CI 1.8, PAPi 1.9, PVR 3.75 (Fick) and 4.0 (TD) - Continue diuresing with IV lasix . Renal function stable. - Continue Jardiance  10 - Off spiro and Entresto  and digoxin  with low BP and previous AKI - Continue midodrine  for BP support - Starting workup for advanced therapies, ? VAD versus transplant. Good support system at home. Has multiple questions and is eager to continue discussions/workup. Note, she has an appointment with Dr. Ester [facility]ms at [facility].   2. PE - diagnosed 3/25, small burden RLL. LE venous dopplers + for SVT. No DVT   - Continue Eliquis . No bleeding.    3 LBBB - LBBB, QRS 176 ms - s/p Abbott CRT-D 7/24   4. Partial R Nephrectomy with recent AKI due to overdiuresis - b/l SCr 1.1  - Scr stable at 1.14 today - Follow with diuresis   5. H/o CVA 11/24 - no significant residual deficits  - on statin per Neuro  - Continue Eliquis    6. Hypokalemia - K 4.0 - Continue to supp with diuresis  7. Iron deficiency anemia - T sat 13 and ferritin 122 - IV iron ordered   Length of Stay: 3   FINCH, LINDSAY N PA-C 11/19/2023, 7:13 AM  Advanced Heart Failure Team Pager [phone] (M-F; 7a - 4p)  Please contact [facility] for night-coverage after hours (4p -7a ) and weekends on [email or web address]   Patient seen and examined with the above-signed Advanced Practice Provider and/or Housestaff. I personally reviewed laboratory data, imaging studies and relevant notes. I independently examined the patient and formulated the important aspects of the plan. I have edited the note to reflect any of my changes or salient points. I have personally discussed the plan with the patient and/or family.  Feeling better. RHC  numbers reviewed with her. Diuresing well on IV lasix   General:  Sitting up in chair  No resp difficulty HEENT: normal Neck: supple. JVP 6  Carotids 2+ bilat; no bruits.  Cor: Regular rate & rhythm. No rubs, gallops or murmurs. Lungs: clear Abdomen: soft, nontender, nondistended. No hepatosplenomegaly. No bruits or masses. Good bowel sounds. Extremities: no cyanosis, clubbing, rash, edema Neuro: alert & orientedx3, cranial nerves grossly intact. moves all 4  extremities w/o difficulty. Affect pleasant  She is clearly Stage D HF. Discussed with her and her daughter (via telephone) about pros/cons of transplant and VAD.   She is open to both currently. Will meet with VAD coordinators in am. I d/w Dr. Devore at [facility] as well.   Continue IV lasix  today.   Jules Oar, MD  1:31 PM

## 2023-11-19 NOTE — Plan of Care (Signed)
   Problem: Education: Goal: Knowledge of General Education information will improve Description Including pain rating scale, medication(s)/side effects and non-pharmacologic comfort measures Outcome: Progressing   Problem: Health Behavior/Discharge Planning: Goal: Ability to manage health-related needs will improve Outcome: Progressing

## 2023-11-19 NOTE — TOC Initial Note (Signed)
 Transition of Care [facility]) - Initial/Assessment Note    Patient Details  Name: Lynn Estrada MRN: [id number] Date of Birth: Dec 10, 1960  Transition of Care [facility]) CM/SW Contact:    Ernst Heap Phone Number: [phone] 11/19/2023, 1:26 PM  Clinical Narrative:   HF CSW met with patient at bedside. Patient stated that she lives with he daughter and spouse at home. Patient stated that she has no history of HH services. Patient stated that she does not use any equipment. Patient stated that she has a scale at home. Patient stated that drives occasionally. Patient stated that she is retired. Patient stated that she has a PCP. CSW explained that a hospital follow up will be scheduled closer towards dc. Patient stated that she already has an upcoming appt. CSW will place it on the AVS and verify or reschedule if needed. Patient agreed. Family will provide transportation at dc.   TOC will continue following.                 Expected Discharge Plan: Home/Self Care Barriers to Discharge: Continued Medical Work up   Patient Goals and CMS Choice            Expected Discharge Plan and Services       Living arrangements for the past 2 months: Single Family Home                                      Prior Living Arrangements/Services Living arrangements for the past 2 months: Single Family Home Lives with:: Adult Children, Spouse Patient language and need for interpreter reviewed:: Yes Do you feel safe going back to the place where you live?: Yes      Need for Family Participation in Patient Care: No (Comment) Care giver support system in place?: Yes (comment)   Criminal Activity/Legal Involvement Pertinent to Current Situation/Hospitalization: No - Comment as needed  Activities of Daily Living   ADL Screening (condition at time of admission) Is the patient deaf or have difficulty hearing?: No Does the patient have difficulty seeing, even when wearing  glasses/contacts?: No Does the patient have difficulty concentrating, remembering, or making decisions?: No  Permission Sought/Granted                  Emotional Assessment Appearance:: Appears stated age Attitude/Demeanor/Rapport: Engaged Affect (typically observed): Appropriate Orientation: : Oriented to Self, Oriented to Place, Oriented to  Time, Oriented to Situation Alcohol / Substance Use: Not Applicable Psych Involvement: No (comment)  Admission diagnosis:  Acute on chronic systolic congestive heart failure (HCC) [I50.23] Acute on chronic systolic (congestive) heart failure (HCC) [I50.23] Patient Active Problem List   Diagnosis Date Noted   Acute on chronic systolic (congestive) heart failure (HCC) 11/16/2023   SOB (shortness of breath) 10/08/2023   History of pulmonary embolism 10/08/2023   DM2 (diabetes mellitus, type 2) (HCC) 10/08/2023   Anemia of chronic disease 10/08/2023   Aortic atherosclerosis (HCC) 10/01/2023   Atherosclerosis of coronary artery 10/01/2023   Pulmonary emboli (HCC) 09/30/2023   HCAP (healthcare-associated pneumonia) 09/12/2023   Acute on chronic combined systolic and diastolic CHF (congestive heart failure) (HCC) 08/23/2023   Frequency of urination and polyuria 08/23/2023   Cerebrovascular accident (CVA) (HCC) 05/26/2023   Pyuria 05/25/2023   Left-sided weakness 05/24/2023   Epistaxis 04/22/2023   Benign neoplasm of cecum 03/26/2023   Benign neoplasm of descending colon 03/26/2023  Benign neoplasm of sigmoid colon 03/26/2023   Adhesive capsulitis of right shoulder 08/24/2022   Mass of right hand 02/22/2022   LBBB (left bundle branch block) 02/01/2022   Type 2 diabetes mellitus with hyperglycemia, without long-term current use of insulin  (HCC) 01/22/2022   HLD (hyperlipidemia) 01/21/2022   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 10/26/2021   Chronic combined systolic and diastolic heart failure (HCC) 07/27/2021    Acute combined systolic (congestive) and diastolic (congestive) heart failure (HCC)    Hypocalcemia 07/12/2021   Elevated troponin 07/12/2021   Lipoma of hand 07/20/2020   Osteoarthritis of carpometacarpal (CMC) joint of thumb 07/20/2020   Pain in right hand 07/20/2020   Radial styloid tenosynovitis 07/20/2020   Other osteoarthritis of spine 10/23/2019   Suspected COVID-19 virus infection 06/11/2019   Acute respiratory failure with hypoxia (HCC) 06/11/2019   Multifocal pneumonia 06/11/2019   Carpal tunnel syndrome, bilateral 04/24/2019   Ureteral stone with hydronephrosis 12/31/2016   Hx of adenomatous colonic polyps 08/03/2016   Normocytic anemia 07/10/2016   CAP (community acquired pneumonia) 07/06/2016   Essential hypertension 07/26/2015   Eczema 07/26/2015   Overweight (BMI 25.0-29.9) 07/26/2015   PCP:  Aldine Humphreys, PA Pharmacy:   [facility] [phone] Jonette Nestle, [place] - [address] [place] [address] Phone: [phone] Fax: [phone]   - [facility] [address] [place] [address] Phone: [phone] Fax: [phone]     Social Drivers of Health (SDOH) Social History: SDOH Screenings   Food Insecurity: Patient Declined (11/16/2023)  Housing: Patient Declined (11/16/2023)  Transportation Needs: Patient Declined (11/16/2023)  Utilities: Patient Declined (11/16/2023)  Depression (PHQ2-9): Low Risk  (03/16/2022)  Financial Resource Strain: Low Risk  (10/29/2023)   Received from [facility]  Physical Activity: Unknown (09/24/2023)   Received from [facility]  Social Connections: Patient Declined (09/30/2023)  Stress: No Stress Concern Present (09/24/2023)   Received from [facility]  Tobacco Use: Medium Risk (11/15/2023)   SDOH Interventions:     Readmission Risk Interventions    10/08/2023    4:15 PM  Readmission Risk Prevention Plan  Transportation Screening  Complete  PCP or Specialist Appt within 3-5 Days Complete  HRI or Home Care Consult Complete  Palliative Care Screening Not Applicable  Medication Review (RN Care Manager) Complete

## 2023-11-19 NOTE — Progress Notes (Signed)
 Progress Note   Patient: Lynn Estrada JXB:[id number] DOB: 1960/11/13 DOA: 11/15/2023     3 DOS: the patient was seen and examined on 11/19/2023   Brief hospital course: 63 year old woman with PMH of chronic HFrEF with LVEF 20% status post biventricular pacemaker placement, pulmonary hypertension, pericardial effusion seen on 2D echo done on 11/05/2023, type 2 diabetes, history of CVA, history of PE on Eliquis , who presented to the ER with complaints of 2 days of progressive dyspnea.  Worse with ambulation.  Associated with bilateral lower extremity edema and 5 pounds weight gain in the past 2 days.  States she has been taking her home diuretic, torsemide , only twice a week because it makes her sick and nauseous.  Patient is admitted to the TRH service for further management evaluation of CHF exacerbation with heart failure team consultation.  Assessment and Plan:  Acute on chronic combined systolic and diastolic heart failure: Patient presented with shortness of breath, leg edema, weight gain. BNP 970.  Echocardiogram reviewed showed EF less than 20%. Heart failure team evaluation and follow up appreciated. Patient is on IV Lasix  80 mg twice daily per heart failure team. Recent [facility] admission for AKI, Entresto , digoxin , spironolactone  is held.  BP lower side noted.  Continue Jardiance . S/p RHC on 10/11/2023 and again on 11/18/23 decreased cardiac output, high right sided pressures. -Heart failure team to discuss further about advanced therapy for her heart failure.  Acute kidney injury- Presented with creatinine of 1.26 from baseline of approximately 1. Creatinine has trended down. - Will continue to diurese per cardiology recs. - Avoid nephrotoxins. - Monitor renal function. -Monitor urine output.  History of pulm embolism -on Eliquis  therapy.  Chronic normocytic anemia Iron studies reviewed unremarkable.   Hemoglobin stable.  Hemoglobin baseline appears to be approximately 10. No  active bleeding.  Hyperlipidemia -continue Lipitor.  History of CVA CVA November 2024. - Continue home Eliquis .     Diet:  Diet Orders (From admission, onward)     Start     Ordered   11/18/23 1008  Diet Heart Room service appropriate? Yes; Fluid consistency: Thin  Diet effective now       Question Answer Comment  Room service appropriate? Yes   Fluid consistency: Thin      11/18/23 1007           DVT prophylaxis:  apixaban  (ELIQUIS ) tablet 5 mg  Level of care: Telemetry Cardiac   Code Status: Full Code  Subjective: Patient has no new complaints.  She says her functional status is getting worse.  Being considered for advanced therapies.  Physical Exam: Vitals:   11/18/23 2318 11/19/23 0507 11/19/23 0745 11/19/23 1422  BP: (!) 98/53 112/81 105/81 100/72  Pulse: 93 100 (!) 102 98  Resp: 18 18 18 16   Temp: 98.4 F (36.9 C) 98.4 F (36.9 C) 98.2 F (36.8 C) 98.1 F (36.7 C)  TempSrc: Oral Oral Oral Oral  SpO2: 96% 96% 92%   Weight:  81.1 kg    Height:       Physical Exam   General: Alert, oriented X3  Eyes: Pupils equal, reactive  Oral cavity: moist mucous membranes  Head: Atraumatic, normocephalic  Neck: supple  Chest: Diminished breath sounds CVS: S1,S2 RRR. No murmurs  Abd: No distention, soft, non-tender. No masses palpable  Extr: No edema   MSK: No joint deformities or swelling  Neurological: Grossly intact.    Data Reviewed:      Latest Ref Rng & Units  11/18/2023    9:43 AM 11/16/2023    2:58 PM 11/15/2023   11:15 PM  CBC  WBC 4.0 - 10.5 K/uL  4.4  5.1   Hemoglobin 12.0 - 15.0 g/dL 16.1 - 09.6 g/dL 9.9    04.5  9.8  9.7   Hematocrit 36.0 - 46.0 % 36.0 - 46.0 % 29.0    30.0  31.3  32.0   Platelets 150 - 400 K/uL  243  234       Latest Ref Rng & Units 11/19/2023    6:08 AM 11/18/2023    9:43 AM 11/18/2023    5:44 AM  BMP  Glucose 70 - 99 mg/dL 409   811   BUN 8 - 23 mg/dL 22   26   Creatinine 9.14 - 1.00 mg/dL 7.82   9.56   Sodium  213 - 145 mmol/L 142  140    139  137   Potassium 3.5 - 5.1 mmol/L 4.0  4.0    4.0  4.0   Chloride 98 - 111 mmol/L 105   103   CO2 22 - 32 mmol/L 28   26   Calcium  8.9 - 10.3 mg/dL 9.2   8.8    CARDIAC CATHETERIZATION Result Date: 11/18/2023 Findings: RA = 13 RV = 62/16 PA = 59/34 (48) PCW = 33 Fick cardiac output/index = 4.0/1.9 Thermo CO/CI = 3.8/1.8 PVR = 3.75 (Fick) 4.0 (TD) Ao sat = 97% PA sat = 45%, 45% PAPi = 1.9 Assessment: 1. Markedly elevated filling pressures with severely decrease cardiac output 2. 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 Jules Oar, MD 10:01 AM   Family Communication: n/a  Disposition: Status is: Inpatient Remains inpatient appropriate because: advanced therapy discussions.  Planned Discharge Destination: Home DVT PPx: On systemic anticoagulation with Eliquis .     Time spent: 35 minutes  Author: MDALA-GAUSI, Graeme Menees AGATHA, MD 11/19/2023 2:45 PM Secure chat 7am to 7pm For on call review [email or web address].

## 2023-11-19 NOTE — Progress Notes (Signed)
 VAD coordinator met with pt again today to discussed LVAD and answer questions. Pt tells me that she does not want to sign for evaluation until we sit down and go over everything with her 2 girls and show them the equipment. We have arranged this meeting for 0800 tomorrow 5/21. We have arranged for a VAD pt to be here to meet with the family at 0900.   Adams Adams RN, BSN VAD Coordinator 24/7 Pager [phone]

## 2023-11-20 ENCOUNTER — Other Ambulatory Visit: Payer: Self-pay

## 2023-11-20 ENCOUNTER — Other Ambulatory Visit (HOSPITAL_COMMUNITY): Payer: Self-pay

## 2023-11-20 DIAGNOSIS — I5023 Acute on chronic systolic (congestive) heart failure: Secondary | ICD-10-CM | POA: Diagnosis not present

## 2023-11-20 DIAGNOSIS — Z8673 Personal history of transient ischemic attack (TIA), and cerebral infarction without residual deficits: Secondary | ICD-10-CM

## 2023-11-20 LAB — BASIC METABOLIC PANEL WITH GFR
Anion gap: 11 (ref 5–15)
BUN: 25 mg/dL — ABNORMAL HIGH (ref 8–23)
CO2: 25 mmol/L (ref 22–32)
Calcium: 9.2 mg/dL (ref 8.9–10.3)
Chloride: 102 mmol/L (ref 98–111)
Creatinine, Ser: 1.12 mg/dL — ABNORMAL HIGH (ref 0.44–1.00)
GFR, Estimated: 56 mL/min — ABNORMAL LOW (ref >60)
Glucose, Bld: 101 mg/dL — ABNORMAL HIGH (ref 70–99)
Potassium: 3.7 mmol/L (ref 3.5–5.1)
Sodium: 138 mmol/L (ref 135–145)

## 2023-11-20 LAB — MAGNESIUM: Magnesium: 2.1 mg/dL (ref 1.7–2.4)

## 2023-11-20 LAB — COOXEMETRY PANEL
Carboxyhemoglobin: 1.2 % (ref 0.5–1.5)
Methemoglobin: <0.7 % (ref 0.0–1.5)
O2 Saturation: 53.1 %
Total hemoglobin: 10.4 g/dL — ABNORMAL LOW (ref 12.0–16.0)

## 2023-11-20 MED ORDER — TORSEMIDE 20 MG PO TABS
20.0000 mg | ORAL_TABLET | Freq: Every day | ORAL | Status: DC
  Administered 2023-11-21: 20 mg via ORAL
  Filled 2023-11-20: qty 1

## 2023-11-20 MED ORDER — POTASSIUM CHLORIDE CRYS ER 20 MEQ PO TBCR
60.0000 meq | EXTENDED_RELEASE_TABLET | Freq: Once | ORAL | Status: AC
  Administered 2023-11-20: 60 meq via ORAL
  Filled 2023-11-20: qty 3

## 2023-11-20 MED ORDER — SODIUM CHLORIDE 0.9% FLUSH: 10.0000 mL | INTRAVENOUS | Status: DC | PRN

## 2023-11-20 MED ORDER — MILRINONE LACTATE IN DEXTROSE 20-5 MG/100ML-% IV SOLN
0.2500 ug/kg/min | INTRAVENOUS | Status: DC
  Administered 2023-11-20 – 2023-11-21 (×2): 0.25 ug/kg/min via INTRAVENOUS
  Filled 2023-11-20 (×2): qty 100

## 2023-11-20 MED ORDER — TORSEMIDE 20 MG PO TABS
20.0000 mg | ORAL_TABLET | Freq: Every day | ORAL | 3 refills | Status: DC
Start: 2023-11-21 — End: 2024-01-01
  Filled 2023-11-20: qty 30, 30d supply, fill #0

## 2023-11-20 MED ORDER — SODIUM CHLORIDE 0.9% FLUSH
10.0000 mL | Freq: Two times a day (BID) | INTRAVENOUS | Status: DC
  Administered 2023-11-20 (×2): 10 mL

## 2023-11-20 MED ORDER — FUROSEMIDE 10 MG/ML IJ SOLN
80.0000 mg | Freq: Two times a day (BID) | INTRAMUSCULAR | Status: AC
  Administered 2023-11-20: 80 mg via INTRAVENOUS
  Filled 2023-11-20: qty 8

## 2023-11-20 MED ORDER — CHLORHEXIDINE GLUCONATE CLOTH 2 % EX PADS
6.0000 | MEDICATED_PAD | Freq: Every day | CUTANEOUS | Status: DC
  Administered 2023-11-20 – 2023-11-21 (×2): 6 via TOPICAL

## 2023-11-20 MED ORDER — TORSEMIDE 20 MG PO TABS: 20.0000 mg | ORAL_TABLET | Freq: Every day | ORAL | Status: DC

## 2023-11-20 NOTE — Progress Notes (Signed)
 Progress Note    Lynn Estrada   BJY:[id number]  DOB: 19-Jan-1961  DOA: 11/15/2023     4 PCP: Lynn Humphreys, PA  Initial CC: SOB  Hospital Course: Lynn Estrada is a 63 year old female with PMH of chronic HFrEF with LVEF 20% status post biventricular pacemaker placement, pulmonary hypertension, pericardial effusion seen on 2D echo done on 11/05/2023, type 2 diabetes, history of CVA, history of PE on Eliquis , who presented to the ER with complaints of 2 days of progressive dyspnea.   Associated with bilateral lower extremity edema and 5 pounds weight gain in the past 2 days.  States she has been taking her home diuretic, torsemide , only twice a week because it makes her sick and nauseous.    Assessment and Plan:   Acute on chronic combined systolic and diastolic heart failure Patient presented with shortness of breath, leg edema, weight gain BNP 970.  Echocardiogram reviewed showed EF less than 20% Heart failure team evaluation and follow up appreciated - On Lasix  and milrinone per heart failure team Recent [facility] admission for AKI, Entresto , digoxin , spironolactone  is held.  BP lower side noted.  Continue Jardiance . S/p RHC on 10/11/2023 and again on 11/18/23 decreased cardiac output, high right sided pressures. -Heart failure team to discuss further about advanced therapy for her heart failure; transplant eval also being initiated - tentative plan to see if able to d/c home with milrinone via PICC (placed 5/21)   Acute kidney injury Presented with creatinine of 1.26 from baseline of approximately 1 Creatinine has trended down. 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  Hemoglobin baseline appears to be approximately 10 No active bleeding   Hyperlipidemia -continue Lipitor   History  of CVA CVA November 2024 - Continue home Eliquis   Interval History:  No events overnight. Breathing is improved since admission. Tentative plan for obtaining milrinone in order to discharge patient home.  TOC helping assist.   Old records reviewed in assessment of this patient  Antimicrobials:   DVT prophylaxis:   apixaban  (ELIQUIS ) tablet 5 mg   Code Status:   Code Status: Full Code  Mobility Assessment (Last 72 Hours)     Mobility Assessment     Row Name 11/20/23 1120 11/20/23 0730 11/19/23 1935 11/19/23 0800 11/18/23 2030   Does patient have an order for bedrest or is patient medically unstable -- No - Continue assessment No - Continue assessment No - Continue assessment No - Continue assessment   What is the highest level of mobility based on the progressive mobility assessment? Level 6 (Walks independently in room and hall) - Balance while walking in room without assist - Complete Level 5 (Walks with assist in room/hall) - Balance while stepping forward/back and can walk in room with assist - Complete Level 6 (Walks independently in room and hall) - Balance while walking in room without assist - Complete Level 6 (Walks independently in room and hall) - Balance while walking in room without assist - Complete Level 6 (Walks independently in room and hall) - Balance while walking in room without assist - Complete    Row Name 11/18/23 1200 11/18/23 0744 11/17/23 2024       Does patient have an order for bedrest or is patient medically unstable No - Continue assessment No - Continue assessment No - Continue assessment  What is the highest level of mobility based on the progressive mobility assessment? Level 6 (Walks independently in room and hall) - Balance while walking in room without assist - Complete Level 6 (Walks independently in room and hall) - Balance while walking in room without assist - Complete Level 6 (Walks independently in room and hall) - Balance while walking in  room without assist - Complete              Barriers to discharge: none Disposition Plan:  Home  HH orders placed:  Status is: Inpt  Objective: Blood pressure 95/87, pulse 95, temperature 97.8 F (36.6 C), temperature source Oral, resp. rate 17, height 6\' 2"  (1.88 m), weight 80 kg, SpO2 97%.  Examination:  Physical Exam Constitutional:      Appearance: Normal appearance.  HENT:     Head: Normocephalic and atraumatic.     Mouth/Throat:     Mouth: Mucous membranes are moist.  Eyes:     Extraocular Movements: Extraocular movements intact.  Cardiovascular:     Rate and Rhythm: Normal rate and regular rhythm.  Pulmonary:     Effort: Pulmonary effort is normal. No respiratory distress.     Breath sounds: No wheezing.     Comments: Coarse B/L sounds Abdominal:     General: Bowel sounds are normal. There is no distension.     Palpations: Abdomen is soft.     Tenderness: There is no abdominal tenderness.  Musculoskeletal:        General: Normal range of motion.     Cervical back: Normal range of motion and neck supple.  Skin:    General: Skin is warm and dry.  Neurological:     General: No focal deficit present.     Mental Status: She is alert.  Psychiatric:        Mood and Affect: Mood normal.      Consultants:  AHF  Procedures:    Data Reviewed: Results for orders placed or performed during the hospital encounter of 11/15/23 (from the past 24 hours)  Basic metabolic panel with GFR     Status: Abnormal   Collection Time: 11/20/23  4:59 AM  Result Value Ref Range   Sodium 138 135 - 145 mmol/L   Potassium 3.7 3.5 - 5.1 mmol/L   Chloride 102 98 - 111 mmol/L   CO2 25 22 - 32 mmol/L   Glucose, Bld 101 (H) 70 - 99 mg/dL   BUN 25 (H) 8 - 23 mg/dL   Creatinine, Ser 1.61 (H) 0.44 - 1.00 mg/dL   Calcium  9.2 8.9 - 10.3 mg/dL   GFR, Estimated 56 (L) >60 mL/min   Anion gap 11 5 - 15  Magnesium      Status: None   Collection Time: 11/20/23  4:59 AM  Result Value Ref  Range   Magnesium  2.1 1.7 - 2.4 mg/dL  Cooxemetry Panel (carboxy, met, total hgb, O2 sat)     Status: Abnormal   Collection Time: 11/20/23  3:20 PM  Result Value Ref Range   Total hemoglobin 10.4 (L) 12.0 - 16.0 g/dL   O2 Saturation 09.6 %   Carboxyhemoglobin 1.2 0.5 - 1.5 %   Methemoglobin <0.7 0.0 - 1.5 %    I have reviewed pertinent nursing notes, vitals, labs, and images as necessary. I have ordered labwork to follow up on as indicated.  I have reviewed the last notes from staff over past 24 hours. I have discussed patient's care plan and test  results with nursing staff, CM/SW, and other staff as appropriate.  Time spent: Greater than 50% of the 55 minute visit was spent in counseling/coordination of care for the patient as laid out in the A&P.   LOS: 4 days   Faith Homes, MD [facility] 11/20/2023, 4:15 PM

## 2023-11-20 NOTE — Evaluation (Signed)
 Physical Therapy Brief Evaluation and Discharge Note Patient Details Name: Lynn Estrada MRN: [id number] DOB: 1961/02/10 Today's Date: 11/20/2023   History of Present Illness  63 year old woman who presented to the ER 5/17 with complaints of 2 days of progressive dyspnea on exertion, bilateral LE edema and 5 lb weight gain in last 2 days Admitted for treatment of Acute on chronic combined systolic and diastolic heart failure. Educated on LVAD vs transplant 5/21. Pt has chosen to pursue transplant PMH: chronic HFrEF with LVEF 20% status post biventricular pacemaker placement, pulmonary hypertension, pericardial effusion seen on 2D echo done on 11/05/2023, type 2 diabetes, CVA, history of PE on Eliquis ,  Clinical Impression  PTA pt living with her husband in single story house with one step to enter. Pt reports use of cane, RW or Rollator for ambulation in home and limited community distances. Pt reports independence in ADLs, pt reports she can complete some iADLs, but family does not let her. Pt also relays that she will have to do more to build up her endurance to prepare for possible transplant. Pt is limited in safe mobility today by decreased strength and endurance. Pt is independent with bed mobility, sit<>stand from bed and supervision for ambulation in hallway pushing IV pole. Pt educated on frequent small bouts of mobility throughout the day to build strength and endurance as opposed to one bout of exercise during the day. Pt voices understanding. Pt has no PT or equipment needs at this time. Pt has been referred to Mobility Specialist.       PT Assessment Patient does not need any further PT services  Assistance Needed at Discharge  Intermittent Supervision/Assistance    Equipment Recommendations None recommended by PT     Precautions/Restrictions Precautions Precautions: None Restrictions Weight Bearing Restrictions Per Provider Order: No        Mobility  Bed Mobility    Supine/Sidelying to sit: Independent Sit to supine/sidelying: Independent General bed mobility comments: from flattened bed  Transfers Overall transfer level: Modified independent                 General transfer comment: independent in getting up from bed, able to come to standing from low toilet with minor use of rail,    Ambulation/Gait Ambulation/Gait assistance: Supervision Gait Distance (Feet): 200 Feet Assistive device: IV Pole Gait Pattern/deviations: Step-through pattern, Decreased step length - right, Decreased step length - left Gait Speed: Below normal General Gait Details: slowed, overall steady ambulation in hallway         Balance Overall balance assessment: Modified Independent                        Pertinent Vitals/Pain PT - Brief Vital Signs All Vital Signs Stable: Yes (HR in low 110s with ambulation) Pain Assessment Pain Assessment: No/denies pain     Home Living Family/patient expects to be discharged to:: Private residence Living Arrangements: Spouse/significant other Available Help at Discharge: Family;Friend(s);Available 24 hours/day Home Environment: Stairs to enter  [employer] of Steps: 1 (x2) Home Equipment: Agricultural consultant (2 wheels);Rollator (4 wheels);Cane - single point        Prior Function Level of Independence: Independent with assistive device(s)      UE/LE Assessment   UE ROM/Strength/Tone/Coordination: WFL    LE ROM/Strength/Tone/Coordination: [facility]      Communication   Communication Communication: No apparent difficulties     Cognition Overall Cognitive Status: Appears within functional limits for tasks assessed/performed  General Comments General comments (skin integrity, edema, etc.): pt reports her LE edema has been resolved        Assessment/Plan       PT Visit Diagnosis Muscle weakness (generalized) (M62.81)    No Skilled PT Patient will have necessary level of assist by  caregiver at discharge;Patient is independent with all acitivity/mobility;Patient is modified independent with all activity/mobility    AMPAC 6 Clicks Help needed turning from your back to your side while in a flat bed without using bedrails?: None Help needed moving from lying on your back to sitting on the side of a flat bed without using bedrails?: None Help needed moving to and from a bed to a chair (including a wheelchair)?: None Help needed standing up from a chair using your arms (e.g., wheelchair or bedside chair)?: None Help needed to walk in hospital room?: None Help needed climbing 3-5 steps with a railing? : A Guerrette 6 Click Score: 23      End of Session   Activity Tolerance: Patient tolerated treatment well Patient left: in bed;with call bell/phone within reach Nurse Communication: Mobility status PT Visit Diagnosis: Muscle weakness (generalized) (M62.81)     Time: 2130-8657 PT Time Calculation (min) (ACUTE ONLY): 20 min  Charges:   PT Evaluation $PT Eval Moderate Complexity: 1 Mod      Zvi Duplantis B. Jewel Mortimer PT, DPT Acute Rehabilitation Services Please use secure chat or  Call Office [phone]   Verlie Glisson [facility]  11/20/2023, 11:52 AM

## 2023-11-20 NOTE — Progress Notes (Signed)
 OT Cancellation Note  Patient Details Name: Lynn Estrada MRN: [id number] DOB: 10/18/1960   Cancelled Treatment:    Reason Eval/Treat Not Completed: OT screened, no needs identified, will sign off  Carollee Circle, OTR/L,CBIS  Supplemental OT - [facility] and [facility] Secure Chat Preferred   11/20/2023, 12:00 PM

## 2023-11-20 NOTE — Progress Notes (Signed)
 Advanced Heart Failure Rounding Note   Subjective:    RHC 05/19: Markedly elevated filling pressures, moderate severe mixed PH and severely decreased CO with Fick CI 1.9 and TD CI 1.8.  Patient discussed options including VAD vs transplant with family. Wants to proceed with transplant workup at [facility].  3L UOP last 24 hrs and weight down another 2 lb.    Objective:    Vital Signs:   Temp:  [97.8 F (36.6 C)-98.6 F (37 C)] 97.8 F (36.6 C) (05/21 1136) Pulse Rate:  [83-99] 95 (05/21 1136) Resp:  [16-18] 17 (05/21 1136) BP: (95-113)/(72-87) 95/87 (05/21 1136) SpO2:  [94 %-97 %] 97 % (05/21 1136) Weight:  [80 kg] 80 kg (05/21 0404) Last BM Date : 11/19/23  Weight change: Filed Weights   11/18/23 0456 11/19/23 0507 11/20/23 0404  Weight: 82.6 kg 81.1 kg 80 kg    Intake/Output:   Intake/Output Summary (Last 24 hours) at 11/20/2023 1213 Last data filed at 11/20/2023 0200 Gross per 24 hour  Intake 258.01 ml  Output 2151 ml  Net -1892.99 ml     Physical Exam: General:  Well appearing. Neck: JVP 8-10 Cor: Regular rate & rhythm. No rubs, gallops or murmurs. Lungs: clear Abdomen: soft, nontender, nondistended.  Extremities: no cyanosis, clubbing, rash, edema Neuro: alert & orientedx3, cranial nerves grossly intact. moves all 4 extremities w/o difficulty. Affect pleasant   Telemetry: V paced 90s  Labs: Basic Metabolic Panel: Recent Labs  Lab 11/15/23 2315 11/16/23 0518 11/18/23 0544 11/18/23 0943 11/19/23 0608 11/20/23 0459  NA 140 138 137 139  140 142 138  K 3.7 3.5 4.0 4.0  4.0 4.0 3.7  CL 103 100 103  --  105 102  CO2 25 24 26   --  28 25  GLUCOSE 98 97 111*  --  118* 101*  BUN 21 21 26*  --  22 25*  CREATININE 1.26* 1.25* 1.19*  --  1.14* 1.12*  CALCIUM  9.5 9.4 8.8*  --  9.2 9.2  MG  --  2.0 2.3  --   --  2.1  PHOS  --  3.9  --   --   --   --     Liver Function Tests: Recent Labs  Lab 11/15/23 2315  AST 32  ALT 38  ALKPHOS 53  BILITOT  1.3*  PROT 6.9  ALBUMIN 3.7   Recent Labs  Lab 11/15/23 2315  LIPASE 44   No results for input(s): "AMMONIA" in the last 168 hours.  CBC: Recent Labs  Lab 11/15/23 2315 11/16/23 1458 11/18/23 0943  WBC 5.1 4.4  --   HGB 9.7* 9.8* 10.2*  9.9*  HCT 32.0* 31.3* 30.0*  29.0*  MCV 88.9 87.4  --   PLT 234 243  --     Cardiac Enzymes: No results for input(s): "CKTOTAL", "CKMB", "CKMBINDEX", "TROPONINI" in the last 168 hours.  BNP: BNP (last 3 results) Recent Labs    10/10/23 0222 10/29/23 1036 11/16/23 0241  BNP 373.5* 639.2* 970.4*    ProBNP (last 3 results) No results for input(s): "PROBNP" in the last 8760 hours.    Other results:  Imaging: No results found.    Medications:     Scheduled Medications:  apixaban   5 mg Oral BID   atorvastatin   10 mg Oral Daily   furosemide   80 mg Intravenous BID   midodrine   5 mg Oral TID WC   senna-docusate  2 tablet Oral QHS   sodium  chloride flush  3 mL Intravenous Q12H    Infusions:  iron sucrose 500 mg (11/20/23 1024)   milrinone      PRN Medications: acetaminophen , benzonatate , melatonin, ondansetron  (ZOFRAN ) IV, polyethylene glycol, prochlorperazine , sodium chloride  flush   Assessment/Plan   1. Acute on chronic Combined Systolic/Diastolic Heart Failure, NICM - LHC 1/23 with min CAD. cMRI possible myocarditis  LVEF 18% Suggestive of LBBB, RVEF 33% - Echo 4/23: EF 30-35% -> EF improving but still with significant LV dysfunction (suspect resolving myocarditis) - Echo 10/23. EF 20-25% with nl RV - s/p Abbott CRT-D 7/24 for LBBB  - Echo 2/25 EF 20-25% RV ok  - RHC 10/11/23: RA 7, PA 51/30 (38), PCWP 31, CO/CI(TD) 4.44/2.12 - Admitted 3/25 and 4/25 with ADHF - Admitted to [facility] 4/25 with AK due to overdiuresis. Hydrated and GDMT held - Echo 11/16/23 EF < 20% RV mildly HK - RHC this admit: RA mean 13, PA 59/34 (48), PCWP 33, Fick CI 1.9, TD CI 1.8, PAPi 1.9, PVR 3.75 (Fick) and 4.0 (TD) - Continue diuresis  with IV lasix  80 BID today - Continue Jardiance  10 - Off spiro and Entresto  and digoxin  with low BP and previous AKI - Continue midodrine  for BP support - Dr. Julane Ny reviewed her case with Dr. Devore. She will follow-up with [facility] as outpatient to start transplant workup. In the meantime will continue to optimize volume status and send home on 0.25 milrinone. PICC orders placed.  2. PE - diagnosed 3/25, small burden RLL. LE venous dopplers + for SVT. No DVT   - Continue Eliquis . No bleeding.    3 LBBB - LBBB, QRS 176 ms - s/p Abbott CRT-D 7/24   4. Partial R Nephrectomy with recent AKI due to overdiuresis - b/l SCr 1.1  - Scr stable at 1.12 today - Follow with diuresis   5. H/o CVA 11/24 - no significant residual deficits  - on statin per Neuro  - Continue Eliquis    6. Hypokalemia - K 3.7 - Continue to supp with diuresis  7. Iron deficiency anemia - T sat 13 and ferritin 122 - IV iron ordered  Can go home later today if she gets PICC line and home milrinone set up. Home infusion rep and HF TOC CSW contacted.  Will arrange outpatient follow-up.   Length of Stay: 4   Oviya Ammar N PA-C 11/20/2023, 12:13 PM  Advanced Heart Failure Team Pager [phone] (M-F; 7a - 4p)  Please contact [facility] for night-coverage after hours (4p -7a ) and weekends on [email or web address]

## 2023-11-20 NOTE — TOC Progression Note (Addendum)
 Transition of Care [facility]) - Progression Note    Patient Details  Name: Lynn Estrada MRN: [id number] Date of Birth: Jan 15, 1961  Transition of Care [facility]) CM/SW Contact  Graves-Bigelow, Jari Merles, RN Phone Number: 11/20/2023, 12:39 PM  Clinical Narrative: Case Manager received consult for home Milrinone. Patient has Medicaid; therefore, Medicaid plans do not cover IV Milrinone, supplies for the PICC care, and administration nor the pump. Case Manager is attempting to get an LOG approved from Director of TOC for Milrinone and RN services. Case Manager has called [facility], [facility], [facility], [place], [facility], [facility], [facility], [facility], [facility], [facility], and [facility]-all have said no or either they are not in network. Inability to find an agency, Case Manager can go through [employer]; however, it will be a fee because they are unable to accept Medicaid. Director is working with Legal to see if we can do the LOG for Nursing since the patient has Medicaid.   1555 11-20-23 Case Manager was able to get the LOG signed by the Director for the Milrinone; however not for RN Services-still working on this part. [facility] can get the drug; but need to figure out the nursing agency. Case Manager will continue to follow for additional transition of care needs.   Expected Discharge Plan: Home/Self Care Barriers to Discharge: Continued Medical Work up  Expected Discharge Plan and Services   Discharge Planning Services: CM Consult Post Acute Care Choice: Home Health Living arrangements for the past 2 months: Single Family Home                 DME Arranged: IV pump/equipment DME Agency: Other - Comment Marvine Slovak) Date DME Agency Contacted: 11/20/23 Time DME Agency Contacted: 0930 Representative spoke with at DME Agency: Oralee Billow HH Arranged: RN  Social Determinants of Health (SDOH) Interventions SDOH Screenings   Food Insecurity: Patient Declined (11/16/2023)   Housing: Patient Declined (11/16/2023)  Transportation Needs: Patient Declined (11/16/2023)  Utilities: Patient Declined (11/16/2023)  Depression (PHQ2-9): Low Risk  (03/16/2022)  Financial Resource Strain: Low Risk  (10/29/2023)   Received from [facility]  Physical Activity: Unknown (09/24/2023)   [facility]  Social Connections: Patient Declined (09/30/2023)  Stress: No Stress Concern Present (09/24/2023)   Received from [facility]  Tobacco Use: Medium Risk (11/15/2023)    Readmission Risk Interventions    10/08/2023    4:15 PM  Readmission Risk Prevention Plan  Transportation Screening Complete  PCP or Specialist Appt within 3-5 Days Complete  HRI or Home Care Consult Complete  Palliative Care Screening Not Applicable  Medication Review (RN Care Manager) Complete

## 2023-11-20 NOTE — Hospital Course (Signed)
 Lynn Estrada is a 63 year old female with PMH of chronic HFrEF with LVEF 20% status post biventricular pacemaker placement, pulmonary hypertension, pericardial effusion seen on 2D echo done on 11/05/2023, type 2 diabetes, history of CVA, history of PE on Eliquis , who presented to the ER with complaints of 2 days of progressive dyspnea.   Associated with bilateral lower extremity edema and 5 pounds weight gain in the past 2 days.  States she has been taking her home diuretic, torsemide , only twice a week because it makes her sick and nauseous.    Assessment and Plan:   Acute on chronic combined systolic and diastolic heart failure Patient presented with shortness of breath, leg edema, weight gain BNP 970.  Echocardiogram reviewed showed EF less than 20% Heart failure team evaluation and follow up appreciated - On Lasix  and milrinone per heart failure team Recent [facility] admission for AKI, Entresto , digoxin , spironolactone  is held.  BP lower side noted.  Continue Jardiance . S/p RHC on 10/11/2023 and again on 11/18/23 decreased cardiac output, high right sided pressures. -Heart failure team to discuss further about advanced therapy for her heart failure; transplant eval also being initiated - tentative plan to see if able to d/c home with milrinone via PICC (placed 5/21)   Acute kidney injury Presented with creatinine of 1.26 from baseline of approximately 1 Creatinine has trended down. 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  Hemoglobin baseline appears to be approximately 10 No active bleeding   Hyperlipidemia -continue Lipitor   History of CVA CVA November 2024 - Continue home Eliquis 

## 2023-11-20 NOTE — TOC Transition Note (Signed)
 Transition of Care [facility]) - Discharge Note   Patient Details  Name: Lynn Estrada MRN: [id number] Date of Birth: [birth year]  Transition of Care [facility]) CM/SW Contact:  Ernst Heap Phone Number: [phone] 11/20/2023, 9:28 AM   Clinical Narrative:   HF CSW called to schedule patients Hospital follow up appointment for Wednesday, Nov 27, 2023 at 11 AM.   Family will provide transportation home at [employer].       Barriers to Discharge: Continued Medical Work up   Patient Goals and CMS Choice            Discharge Placement                       Discharge Plan and Services Additional resources added to the After Visit Summary for                                       Social Drivers of Health (SDOH) Interventions SDOH Screenings   Food Insecurity: Patient Declined (11/16/2023)  Housing: Patient Declined (11/16/2023)  Transportation Needs: Patient Declined (11/16/2023)  Utilities: Patient Declined (11/16/2023)  Depression (PHQ2-9): Low Risk  (03/16/2022)  Financial Resource Strain: Low Risk  (10/29/2023)   Received from [facility]  Physical Activity: Unknown (09/24/2023)   Received from [facility]  Social Connections: Patient Declined (09/30/2023)  Stress: No Stress Concern Present (09/24/2023)   Received from [facility]  Tobacco Use: Medium Risk (11/15/2023)     Readmission Risk Interventions    10/08/2023    4:15 PM  Readmission Risk Prevention Plan  Transportation Screening Complete  PCP or Specialist Appt within 3-5 Days Complete  HRI or Home Care Consult Complete  Palliative Care Screening Not Applicable  Medication Review (RN Care Manager) Complete

## 2023-11-20 NOTE — Progress Notes (Signed)
 Peripherally Inserted Central Catheter Placement  The IV Nurse has discussed with the patient and/or persons authorized to consent for the patient, the purpose of this procedure and the potential benefits and risks involved with this procedure.  The benefits include less needle sticks, lab draws from the catheter, and the patient may be discharged home with the catheter. Risks include, but not limited to, infection, bleeding, blood clot (thrombus formation), and puncture of an artery; nerve damage and irregular heartbeat and possibility to perform a PICC exchange if needed/ordered by physician.  Alternatives to this procedure were also discussed.  Bard Power PICC patient education guide, fact sheet on infection prevention and patient information card has been provided to patient /or left at bedside.    PICC Placement Documentation  PICC Single Lumen 11/20/23 Right Basilic 41 cm 0 cm (Active)  Indication for Insertion or Continuance of Line Vasoactive infusions 11/20/23 1408  Site Assessment Clean, Dry, Intact 11/20/23 1408  Line Status Flushed;Saline locked;Blood return noted 11/20/23 1408  Dressing Type Transparent;Securing device 11/20/23 1408  Dressing Status Antimicrobial disc/dressing in place;Clean, Dry, Intact 11/20/23 1408  Line Adjustment (NICU/IV Team Only) No 11/20/23 1408  Dressing Intervention Adhesive placed at insertion site (IV team only) 11/20/23 1408  Dressing Change Due 11/27/23 11/20/23 1408       Daphney Eans 11/20/2023, 2:13 PM

## 2023-11-20 NOTE — Progress Notes (Signed)
 VAD Coordinator met with pt and her daughters at bedside this morning to follow up on discussion abut advanced therapies. After extensive discussion with Dr. Julane Ny yesterday about heart transplant vs VAD pt has decide she would like to proceed with transplant evaluation at this time. Discussed with Dr.Bensimhon and transplant evaluation packet sent to [facility] for workup.   Laurice Pope RN, BSN VAD Coordinator 24/7 Pager [phone]

## 2023-11-21 ENCOUNTER — Other Ambulatory Visit (HOSPITAL_COMMUNITY): Payer: Self-pay

## 2023-11-21 DIAGNOSIS — N179 Acute kidney failure, unspecified: Secondary | ICD-10-CM | POA: Diagnosis not present

## 2023-11-21 DIAGNOSIS — I5023 Acute on chronic systolic (congestive) heart failure: Secondary | ICD-10-CM | POA: Diagnosis not present

## 2023-11-21 LAB — BASIC METABOLIC PANEL WITH GFR
Anion gap: 11 (ref 5–15)
BUN: 26 mg/dL — ABNORMAL HIGH (ref 8–23)
CO2: 25 mmol/L (ref 22–32)
Calcium: 9.2 mg/dL (ref 8.9–10.3)
Chloride: 101 mmol/L (ref 98–111)
Creatinine, Ser: 1.11 mg/dL — ABNORMAL HIGH (ref 0.44–1.00)
GFR, Estimated: 57 mL/min — ABNORMAL LOW (ref >60)
Glucose, Bld: 107 mg/dL — ABNORMAL HIGH (ref 70–99)
Potassium: 3.5 mmol/L (ref 3.5–5.1)
Sodium: 137 mmol/L (ref 135–145)

## 2023-11-21 LAB — COOXEMETRY PANEL
Carboxyhemoglobin: 1 % (ref 0.5–1.5)
Methemoglobin: 0.8 % (ref 0.0–1.5)
O2 Saturation: 63.2 %
Total hemoglobin: 10.2 g/dL — ABNORMAL LOW (ref 12.0–16.0)

## 2023-11-21 LAB — MAGNESIUM: Magnesium: 2.3 mg/dL (ref 1.7–2.4)

## 2023-11-21 MED ORDER — POTASSIUM CHLORIDE CRYS ER 20 MEQ PO TBCR
40.0000 meq | EXTENDED_RELEASE_TABLET | Freq: Once | ORAL | Status: AC
  Administered 2023-11-21: 40 meq via ORAL
  Filled 2023-11-21: qty 2

## 2023-11-21 MED ORDER — POTASSIUM CHLORIDE CRYS ER 20 MEQ PO TBCR
40.0000 meq | EXTENDED_RELEASE_TABLET | Freq: Every day | ORAL | 3 refills | Status: DC
  Filled 2023-11-21: qty 30, 15d supply, fill #0
  Filled 2023-12-05: qty 30, 15d supply, fill #1
  Filled 2023-12-23: qty 60, 30d supply, fill #0

## 2023-11-21 NOTE — Progress Notes (Addendum)
 Advanced Heart Failure Rounding Note   Subjective:    RHC 05/19: Markedly elevated filling pressures, moderate severe mixed PH and severely decreased CO with Fick CI 1.9 and TD CI 1.8.  CO-OX 63% with Fick CI 2.7 on 0.25 milrinone.  CVP 4.  Notes more energy with addition of milrinone. Denies dyspnea, orthopnea and PND. She's sad that she's missing the funeral of a close relative.   Objective:    Vital Signs:   Temp:  [97.5 F (36.4 C)-98.4 F (36.9 C)] 97.8 F (36.6 C) (05/22 0423) Pulse Rate:  [83-99] 83 (05/22 0423) Resp:  [16-18] 18 (05/22 0423) BP: (94-108)/(59-96) 104/69 (05/22 0423) SpO2:  [94 %-100 %] 96 % (05/22 0423) Weight:  [80.6 kg] 80.6 kg (05/22 0500) Last BM Date : 11/19/23  Weight change: Filed Weights   11/19/23 0507 11/20/23 0404 11/21/23 0500  Weight: 81.1 kg 80 kg 80.6 kg    Intake/Output:   Intake/Output Summary (Last 24 hours) at 11/21/2023 0735 Last data filed at 11/20/2023 2100 Gross per 24 hour  Intake 661.69 ml  Output 400 ml  Net 261.69 ml     Physical Exam: General:  Well appearing.  Neck: no JVD.  Cor: Regular rate & rhythm. No rubs, gallops or murmurs. Lungs: clear Abdomen: soft, nontender, nondistended.  Extremities: no edema, RUE PICC Neuro: alert & orientedx3. Affect  depressed    Telemetry: V paced 90s  Labs: Basic Metabolic Panel: Recent Labs  Lab 11/16/23 0518 11/18/23 0544 11/18/23 0943 11/19/23 0608 11/20/23 0459 11/21/23 0540  NA 138 137 139  140 142 138 137  K 3.5 4.0 4.0  4.0 4.0 3.7 3.5  CL 100 103  --  105 102 101  CO2 24 26  --  28 25 25   GLUCOSE 97 111*  --  118* 101* 107*  BUN 21 26*  --  22 25* 26*  CREATININE 1.25* 1.19*  --  1.14* 1.12* 1.11*  CALCIUM  9.4 8.8*  --  9.2 9.2 9.2  MG 2.0 2.3  --   --  2.1 2.3  PHOS 3.9  --   --   --   --   --     Liver Function Tests: Recent Labs  Lab 11/15/23 2315  AST 32  ALT 38  ALKPHOS 53  BILITOT 1.3*  PROT 6.9  ALBUMIN 3.7   Recent Labs   Lab 11/15/23 2315  LIPASE 44   No results for input(s): "AMMONIA" in the last 168 hours.  CBC: Recent Labs  Lab 11/15/23 2315 11/16/23 1458 11/18/23 0943  WBC 5.1 4.4  --   HGB 9.7* 9.8* 10.2*  9.9*  HCT 32.0* 31.3* 30.0*  29.0*  MCV 88.9 87.4  --   PLT 234 243  --     Cardiac Enzymes: No results for input(s): "CKTOTAL", "CKMB", "CKMBINDEX", "TROPONINI" in the last 168 hours.  BNP: BNP (last 3 results) Recent Labs    10/10/23 0222 10/29/23 1036 11/16/23 0241  BNP 373.5* 639.2* 970.4*    ProBNP (last 3 results) No results for input(s): "PROBNP" in the last 8760 hours.    Other results:  Imaging: US  EKG SITE RITE Result Date: 11/20/2023 If Site Rite image not attached, placement could not be confirmed due to current cardiac rhythm.     Medications:     Scheduled Medications:  apixaban   5 mg Oral BID   atorvastatin   10 mg Oral Daily   Chlorhexidine Gluconate Cloth  6 each Topical Daily  midodrine   5 mg Oral TID WC   potassium chloride   40 mEq Oral Once   senna-docusate  2 tablet Oral QHS   sodium chloride  flush  10-40 mL Intracatheter Q12H   sodium chloride  flush  3 mL Intravenous Q12H   torsemide   20 mg Oral Daily    Infusions:  milrinone 0.25 mcg/kg/min (11/21/23 0406)    PRN Medications: acetaminophen , benzonatate , melatonin, ondansetron  (ZOFRAN ) IV, polyethylene glycol, prochlorperazine , sodium chloride  flush, sodium chloride  flush   Assessment/Plan   1. Acute on chronic Combined Systolic/Diastolic Heart Failure, NICM - LHC 1/23 with min CAD. cMRI possible myocarditis  LVEF 18% Suggestive of LBBB, RVEF 33% - Echo 4/23: EF 30-35% -> EF improving but still with significant LV dysfunction (suspect resolving myocarditis) - Echo 10/23. EF 20-25% with nl RV - s/p Abbott CRT-D 7/24 for LBBB  - Echo 2/25 EF 20-25% RV ok  - RHC 10/11/23: RA 7, PA 51/30 (38), PCWP 31, CO/CI(TD) 4.44/2.12 - Admitted 3/25 and 4/25 with ADHF - Admitted to [facility]  4/25 with AK due to overdiuresis. Hydrated and GDMT held - Echo 11/16/23 EF < 20% RV mildly HK - RHC this admit: RA mean 13, PA 59/34 (48), PCWP 33, Fick CI 1.9, TD CI 1.8, PAPi 1.9, PVR 3.75 (Fick) and 4.0 (TD) - She has end-stage heart failure. NYHA IV. Dr. Julane Ny discussed with Dr. Devore at [facility]. Initial plan was to send home with milrinone and start transplant workup as outpatient. We have been unable to find an agency to accept her insurance. Will reach out to [facility] today to discuss next steps. - CO-OX 63% with Fick CI of 2.7 today on 0.25 milrinone - CVP 4. Stopped IV lasix . Start 20 mg Torsemide  daily. Supp K, 3.5 this am. - Continue Jardiance  10 - Off spiro and Entresto  and digoxin  with low BP and previous AKI - Continue midodrine  for BP support  2. PE - diagnosed 3/25, small burden RLL. LE venous dopplers + for SVT. No DVT   - Continue Eliquis . No bleeding.    3 LBBB - LBBB, QRS 176 ms - s/p Abbott CRT-D 7/24   4. Partial R Nephrectomy with recent AKI due to overdiuresis - b/l SCr 1.1  - Scr stable at  1.1 today - Follow with diuresis   5. H/o CVA 11/24 - no significant residual deficits  - on statin per Neuro  - Continue Eliquis    6. Hypokalemia - K 3.7 - Continue to supp with diuresis  7. Iron deficiency anemia - T sat 13 and ferritin 122 - IV iron ordered    Length of Stay: 5   FINCH, LINDSAY N PA-C 11/21/2023, 7:35 AM  Advanced Heart Failure Team Pager [phone] (M-F; 7a - 4p)  Please contact [facility] for night-coverage after hours (4p -7a ) and weekends on [email or web address]   Patient seen and examined with the above-signed Advanced Practice Provider and/or Housestaff. I personally reviewed laboratory data, imaging studies and relevant notes. I independently examined the patient and formulated the important aspects of the plan. I have edited the note to reflect any of my changes or salient points. I have personally discussed the plan with the patient and/or  family.  Feels better with milrinone. Co-ox improved. CVP low.   General:  Sitting up No resp difficulty HEENT: normal Neck: supple. no JVD. Carotids 2+ bilat; no bruits. No lymphadenopathy or thryomegaly appreciated. Cor: PMI nondisplaced. Regular rate & rhythm. +s3 Lungs: clear Abdomen: soft, nontender, nondistended. No hepatosplenomegaly. No  bruits or masses. Good bowel sounds. Extremities: no cyanosis, clubbing, rash, edema Neuro: alert & orientedx3, cranial nerves grossly intact. moves all 4 extremities w/o difficulty. Affect pleasant  She has end-stage HF and appears to be a transplant candidate in preliminary discussions with [facility]. Now improved with milrinone . With elevated PVR and multiple hospital admits for cardiorenal syndrome would benefit from milrinone  support while transplant process underway but unable to get agency to support. I will d/w Case Management team and [facility] personally today.   Jules Oar, MD  8:22 AM

## 2023-11-21 NOTE — Progress Notes (Signed)
 Mobility Specialist Progress Note;   11/21/23 1410  Mobility  Activity Ambulated independently in hallway  Level of Assistance Standby assist, set-up cues, supervision of patient - no hands on  Assistive Device Other (Comment) (IV pole)  Distance Ambulated (ft) 200 ft  Activity Response Tolerated well  Mobility Referral Yes  Mobility visit 1 Mobility  Mobility Specialist Start Time (ACUTE ONLY) 1410  Mobility Specialist Stop Time (ACUTE ONLY) 1423  Mobility Specialist Time Calculation (min) (ACUTE ONLY) 13 min   Pt agreeable to mobility. Required no physical assistance during ambulation, SV. HR up to 115 bpm w/ activity. No c/o when asked. Took 1x standing rest break d/t fatigue. Pt returned back to bed with all needs met-- call bell in reach.   Janit Meline Mobility Specialist Please contact via SecureChat or [employer] [phone]

## 2023-11-21 NOTE — TOC Transition Note (Signed)
 Transition of Care [facility]) - Discharge Note   Patient Details  Name: Lynn Estrada MRN: [id number] Date of Birth: 02/03/1961  Transition of Care [facility]) CM/SW Contact:  Benjiman Bras, RN Phone Number: [phone] 11/21/2023, 12:05 PM   Clinical Narrative:     TOC CM received call from [facility] rep, Pam and contract for LOG for Home Milrinone  and nursing is complete. She will be here at 4 to 5 pm to connect to medication for dc home.   Final next level of care: Home w Home Health Services Barriers to Discharge: No Barriers Identified   Patient Goals and CMS Choice Patient states their goals for this hospitalization and ongoing recovery are:: patient wants to return home with Milrinone .   Choice offered to / list presented to : NA      Discharge Placement                       Discharge Plan and Services Additional resources added to the After Visit Summary for     Discharge Planning Services: CM Consult Post Acute Care Choice: Home Health          DME Arranged: IV pump/equipment DME Agency: Other - Comment Mar[facility] Slovak) Date DME Agency Contacted: 11/20/23 Time DME Agency Contacted: 0930 Representative spoke with at DME Agency: Pam HH Arranged: RN HH Agency: [facility] Date HH Agency Contacted: 11/21/23 Time HH Agency Contacted: 1201 Representative spoke with at [facility] Agency: Kay Parson, RN, [facility] rep  Social Drivers of Health (SDOH) Interventions SDOH Screenings   Food Insecurity: Patient Declined (11/16/2023)  Housing: Patient Declined (11/16/2023)  Transportation Needs: Patient Declined (11/16/2023)  Utilities: Patient Declined (11/16/2023)  Depression (PHQ2-9): Low Risk  (03/16/2022)  Financial Resource Strain: Low Risk  (10/29/2023)   Received from [facility]  Physical Activity: Unknown (09/24/2023)   Received from [facility]  Social Connections: Patient Declined (09/30/2023)  Stress: No Stress Concern Present (09/24/2023)    Received from [facility]  Tobacco Use: Medium Risk (11/15/2023)     Readmission Risk Interventions    10/08/2023    4:15 PM  Readmission Risk Prevention Plan  Transportation Screening Complete  PCP or Specialist Appt within 3-5 Days Complete  HRI or Home Care Consult Complete  Palliative Care Screening Not Applicable  Medication Review (RN Care Manager) Complete

## 2023-11-21 NOTE — Progress Notes (Signed)
 Dr. Bensimhon discussed plans for home milrinone with [facility]. Plan is for her to receive milrinone and nursing services by [facility] under [facility] LOG for 30 days. Afterwards, milrinone will be transitioned to [facility] home infusion w/ the [facility] Advanced Heart Failure/Transplant program while undergoing workup for transplant.  Once hookup is arranged with [facility] this afternoon she can discharge home.  HF medications for discharge Eliquis  5 BID Atorvastatin  10 mg daily Midodrine  5 mg TID Milrinone 0.25 mcg/kg/min Torsemide  20 mg daily Potassium 40 mEq daily

## 2023-11-21 NOTE — Discharge Summary (Signed)
 Physician Discharge Summary   Lynn Estrada ZOX:[id number] DOB: Mar 05, 1961 DOA: 11/15/2023  PCP: Aldine Humphreys, PA  Admit date: 11/15/2023 Discharge date: 11/21/2023  Admitted From: Home Disposition:  Home Discharging physician: Faith Homes, MD Barriers to discharge: none  Recommendations at discharge: Follow-up with cardiology Transplant referral to [facility] in process   Discharge Condition: stable CODE STATUS: Full  Diet recommendation:  Diet Orders (From admission, onward)     Start     Ordered   11/21/23 0000  Diet - low sodium heart healthy        11/21/23 1637   11/18/23 1008  Diet Heart Room service appropriate? Yes; Fluid consistency: Thin  Diet effective now       Question Answer Comment  Room service appropriate? Yes   Fluid consistency: Thin      11/18/23 1007            Hospital Course: Lynn Estrada is a 63 year old female with PMH of chronic HFrEF with LVEF 20% status post biventricular pacemaker placement, pulmonary hypertension, pericardial effusion seen on 2D echo done on 11/05/2023, type 2 diabetes, history of CVA, history of PE on Eliquis , who presented to the ER with complaints of 2 days of progressive dyspnea.   Associated with bilateral lower extremity edema and 5 pounds weight gain in the past 2 days.  States she has been taking her home diuretic, torsemide , only twice a week because it makes her sick and nauseous.    Assessment and Plan:   Acute on chronic combined systolic and diastolic heart failure Patient presented with shortness of breath, leg edema, weight gain BNP 970.  Echocardiogram reviewed showed EF less than 20% Heart failure team evaluation and follow up appreciated - On Lasix  and milrinone per heart failure team Recent [facility] admission for AKI, Entresto , digoxin , spironolactone  is held.  BP lower side noted.  Continue Jardiance . S/p RHC on 10/11/2023 and again on 11/18/23 decreased cardiac output, high right sided pressures. -Heart  failure team to discuss further about advanced therapy for her heart failure; transplant eval also being initiated - tentative plan to see if able to d/c home with milrinone via PICC (placed 5/21)   Acute kidney injury Presented with creatinine of 1.26 from baseline of approximately 1 Creatinine has trended down. 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  Hemoglobin baseline appears to be approximately 10 No active bleeding   Hyperlipidemia -continue Lipitor   History of CVA CVA November 2024 - Continue home Eliquis    The patient's acute and chronic medical conditions were treated accordingly. On day of discharge, patient was felt deemed stable for discharge. Patient/family member advised to call PCP or come back to ER if needed.   Principal Diagnosis: Acute on chronic systolic (congestive) heart failure [facility])  Discharge Diagnoses: Active Hospital Problems   Diagnosis Date Noted   Acute on chronic systolic (congestive) heart failure (HCC) 11/16/2023    Priority: 1.   AKI (acute kidney injury) (HCC) 07/02/2016    Priority: 2.   History of CVA (cerebrovascular accident) 11/20/2023   History of pulmonary embolism 10/08/2023   DM2 (diabetes mellitus, type 2) (HCC) 10/08/2023   HLD (hyperlipidemia) 01/21/2022   Normocytic anemia 07/10/2016   Essential hypertension 07/26/2015    Resolved Hospital Problems  No resolved problems to display.  Discharge Instructions     Diet - low sodium heart healthy   Complete by: As directed    Increase activity slowly   Complete by: As directed       Allergies as of 11/21/2023       Reactions   Latex Rash, Hives   Bactrim [sulfamethoxazole -trimethoprim] Rash   Carvedilol  Itching   Metoprolol  Itching, Rash        Medication List      STOP taking these medications    clopidogrel  75 MG tablet Commonly known as: PLAVIX        TAKE these medications    acetaminophen  500 MG tablet Commonly known as: TYLENOL  Take 500-1,000 mg by mouth every 6 (six) hours as needed (for pain.).   apixaban  5 MG Tabs tablet Commonly known as: ELIQUIS  Take 1 tablet (5 mg total) by mouth 2 (two) times daily.   atorvastatin  10 MG tablet Commonly known as: LIPITOR Take 10 mg by mouth in the morning.   calcium  carbonate 500 MG chewable tablet Commonly known as: TUMS - dosed in mg elemental calcium  Chew 1 tablet by mouth daily as needed for heartburn or indigestion.   Jardiance  10 MG Tabs tablet Generic drug: empagliflozin  Take 10 mg by mouth daily.   metFORMIN  500 MG tablet Commonly known as: GLUCOPHAGE  Take 1 tablet by mouth 2 (two) times daily with a meal.   midodrine  5 MG tablet Commonly known as: PROAMATINE  Take 1 tablet (5 mg total) by mouth 3 (three) times daily with meals.   ondansetron  4 MG tablet Commonly known as: ZOFRAN  Take 4 mg by mouth every 8 (eight) hours as needed for nausea or vomiting.   ONE-A-DAY WOMENS PO Take 1 tablet by mouth daily with breakfast.   potassium chloride  SA 20 MEQ tablet Commonly known as: KLOR-CON  M Take 2 tablets (40 mEq total) by mouth daily.   torsemide  20 MG tablet Commonly known as: DEMADEX  Take 1 tablet (20 mg total) by mouth daily. What changed:  when to take this additional instructions               Durable Medical Equipment  (From admission, onward)           Start     Ordered   11/20/23 0911  Heart failure home health orders  (Heart failure home health orders / Face to face)  Once       Comments: Heart Failure Follow-up Care:  Verify follow-up appointments per Patient Discharge Instructions. Confirm transportation arranged. Reconcile home medications with discharge medication list. Remove discontinued medications from use. Assist patient/caregiver  to manage medications using pill box. Reinforce low sodium food selection Assessments: Vital signs and oxygen saturation at each visit. Assess home environment for safety concerns, caregiver support and availability of low-sodium foods. Consult Child psychotherapist, PT/OT, Dietitian, and CNA based on assessments. Perform comprehensive cardiopulmonary assessment. Notify MD for any change in condition or weight gain of 3 pounds in one day or 5 pounds in one week with symptoms. Daily Weights and Symptom Monitoring: Ensure patient has access to scales. Teach patient/caregiver to weigh daily before breakfast and after voiding using same scale and record.    Teach patient/caregiver to track weight and symptoms and when to notify Provider. Activity: Develop individualized activity plan with patient/caregiver.  [facility] to provide  Labs every other week to include BMET, Mg, and CBC with Diff. Additional as needed. Should be drawn via PERIPHERAL stick. NOT PICC line.   V2536 Milrinone 0.25 mcg/kg/min X  52 weeks A4221 Supplies for maintenance of drug infusion catheter A4222 Supplies for the external drug infusion per cassette or bag [phone] Ambulatory Infusion pump  Question Answer Comment  Heart Failure Follow-up Care [facility] at [phone]   Obtain the following labs Basic Metabolic Panel   Lab frequency Other see comments   Fax lab results to [facility] at [phone]   Diet Low Sodium Heart Healthy   Fluid restrictions: 1800 mL Fluid   Skilled Nurse to notify MD of weight trends weekly for first 2 weeks. May fax or call: [facility] at [phone] (fax) or [phone]      11/20/23 0913            Follow-up Information     Aldine Humphreys, PA. Go in 4 day(s).   Specialty: Family Medicine Why: Hospital follow up appointment scheduled for Wednesday, Nov 27, 2023 at 11 AM.  PLEASE ARRIVE 10-15 minutes early.  PLEASE call to cancel/reschedule if you CANNOT make  apointment. Contact information: [address] [place] [address] [phone]         [facility] Follow up.   Why: Home Infusion Contact information: [phone]               Allergies  Allergen Reactions   Latex Rash and Hives   Bactrim [Sulfamethoxazole -Trimethoprim] Rash   Carvedilol  Itching   Metoprolol  Itching and Rash    Consultations: Cardiology  Procedures:   Discharge Exam: BP 103/64 (BP Location: Left Arm)   Pulse (!) 102   Temp 98 F (36.7 C) (Oral)   Resp 18   Ht 6\' 2"  (1.88 m)   Wt 80.6 kg   SpO2 96%   BMI 22.80 kg/m  Physical Exam Constitutional:      Appearance: Normal appearance.  HENT:     Head: Normocephalic and atraumatic.     Mouth/Throat:     Mouth: Mucous membranes are moist.  Eyes:     Extraocular Movements: Extraocular movements intact.  Cardiovascular:     Rate and Rhythm: Normal rate and regular rhythm.  Pulmonary:     Effort: Pulmonary effort is normal. No respiratory distress.     Breath sounds: No wheezing.     Comments: Coarse B/L sounds Abdominal:     General: Bowel sounds are normal. There is no distension.     Palpations: Abdomen is soft.     Tenderness: There is no abdominal tenderness.  Musculoskeletal:        General: Normal range of motion.     Cervical back: Normal range of motion and neck supple.  Skin:    General: Skin is warm and dry.  Neurological:     General: No focal deficit present.     Mental Status: She is alert.  Psychiatric:        Mood and Affect: Mood normal.      The results of significant diagnostics from this hospitalization (including imaging, microbiology, ancillary and laboratory) are listed below for reference.   Microbiology: Recent Results (from the past 240 hours)  Urine Culture     Status: Abnormal   Collection Time: 11/16/23  2:19 AM   Specimen: Urine, Clean Catch  Result Value Ref Range Status   Specimen Description URINE, CLEAN  CATCH  Final   Special Requests   Final    NONE Performed at [facility], [address] [place], [place] [address]    Culture >=100,000  COLONIES/mL KLEBSIELLA PNEUMONIAE (A)  Final   Report Status 11/18/2023 FINAL  Final   Organism ID, Bacteria KLEBSIELLA PNEUMONIAE (A)  Final      Susceptibility   Klebsiella pneumoniae - MIC*    AMPICILLIN >=32 RESISTANT Resistant     CEFAZOLIN  <=4 SENSITIVE Sensitive     CEFEPIME  <=0.12 SENSITIVE Sensitive     CEFTRIAXONE  <=0.25 SENSITIVE Sensitive     CIPROFLOXACIN  <=0.25 SENSITIVE Sensitive     GENTAMICIN  <=1 SENSITIVE Sensitive     IMIPENEM 1 SENSITIVE Sensitive     NITROFURANTOIN  <=16 SENSITIVE Sensitive     TRIMETH/SULFA <=20 SENSITIVE Sensitive     AMPICILLIN/SULBACTAM 16 INTERMEDIATE Intermediate     PIP/TAZO >=128 RESISTANT Resistant ug/mL    * >=100,000 COLONIES/mL KLEBSIELLA PNEUMONIAE     Labs: BNP (last 3 results) Recent Labs    10/10/23 0222 10/29/23 1036 11/16/23 0241  BNP 373.5* 639.2* 970.4*   Basic Metabolic Panel: Recent Labs  Lab 11/16/23 0518 11/18/23 0544 11/18/23 0943 11/19/23 0608 11/20/23 0459 11/21/23 0540  NA 138 137 139  140 142 138 137  K 3.5 4.0 4.0  4.0 4.0 3.7 3.5  CL 100 103  --  105 102 101  CO2 24 26  --  28 25 25   GLUCOSE 97 111*  --  118* 101* 107*  BUN 21 26*  --  22 25* 26*  CREATININE 1.25* 1.19*  --  1.14* 1.12* 1.11*  CALCIUM  9.4 8.8*  --  9.2 9.2 9.2  MG 2.0 2.3  --   --  2.1 2.3  PHOS 3.9  --   --   --   --   --    Liver Function Tests: Recent Labs  Lab 11/15/23 2315  AST 32  ALT 38  ALKPHOS 53  BILITOT 1.3*  PROT 6.9  ALBUMIN 3.7   Recent Labs  Lab 11/15/23 2315  LIPASE 44   No results for input(s): "AMMONIA" in the last 168 hours. CBC: Recent Labs  Lab 11/15/23 2315 11/16/23 1458 11/18/23 0943  WBC 5.1 4.4  --   HGB 9.7* 9.8* 10.2*  9.9*  HCT 32.0* 31.3* 30.0*  29.0*  MCV 88.9 87.4  --   PLT 234 243  --    Cardiac Enzymes: No results for  input(s): "CKTOTAL", "CKMB", "CKMBINDEX", "TROPONINI" in the last 168 hours. BNP: Invalid input(s): "POCBNP" CBG: No results for input(s): "GLUCAP" in the last 168 hours. D-Dimer No results for input(s): "DDIMER" in the last 72 hours. Hgb A1c No results for input(s): "HGBA1C" in the last 72 hours. Lipid Profile No results for input(s): "CHOL", "HDL", "LDLCALC", "TRIG", "CHOLHDL", "LDLDIRECT" in the last 72 hours. Thyroid  function studies No results for input(s): "TSH", "T4TOTAL", "T3FREE", "THYROIDAB" in the last 72 hours.  Invalid input(s): "FREET3" Anemia work up No results for input(s): "VITAMINB12", "FOLATE", "FERRITIN", "TIBC", "IRON", "RETICCTPCT" in the last 72 hours. Urinalysis    Component Value Date/Time   COLORURINE YELLOW 11/15/2023 2328   APPEARANCEUR CLOUDY (A) 11/15/2023 2328   LABSPEC 1.014 11/15/2023 2328   PHURINE 5.0 11/15/2023 2328   GLUCOSEU 50 (A) 11/15/2023 2328   HGBUR SMALL (A) 11/15/2023 2328   BILIRUBINUR NEGATIVE 11/15/2023 2328   BILIRUBINUR negative 12/07/2015 1226   KETONESUR NEGATIVE 11/15/2023 2328   PROTEINUR 30 (A) 11/15/2023 2328   UROBILINOGEN 0.2 12/07/2015 1226   NITRITE NEGATIVE 11/15/2023 2328   LEUKOCYTESUR LARGE (A) 11/15/2023 2328   Sepsis Labs Recent Labs  Lab 11/15/23 2315 11/16/23 1458  WBC 5.1 4.4   Microbiology Recent Results (from the past 240 hours)  Urine Culture     Status: Abnormal   Collection Time: 11/16/23  2:19 AM   Specimen: Urine, Clean Catch  Result Value Ref Range Status   Specimen Description URINE, CLEAN CATCH  Final   Special Requests   Final    NONE Performed at [facility], [address] [place], [place] [address]    Culture >=100,000 COLONIES/mL KLEBSIELLA PNEUMONIAE (A)  Final   Report Status 11/18/2023 FINAL  Final   Organism ID, Bacteria KLEBSIELLA PNEUMONIAE (A)  Final      Susceptibility   Klebsiella pneumoniae - MIC*    AMPICILLIN >=32 RESISTANT Resistant     CEFAZOLIN  <=4  SENSITIVE Sensitive     CEFEPIME  <=0.12 SENSITIVE Sensitive     CEFTRIAXONE  <=0.25 SENSITIVE Sensitive     CIPROFLOXACIN  <=0.25 SENSITIVE Sensitive     GENTAMICIN  <=1 SENSITIVE Sensitive     IMIPENEM 1 SENSITIVE Sensitive     NITROFURANTOIN  <=16 SENSITIVE Sensitive     TRIMETH/SULFA <=20 SENSITIVE Sensitive     AMPICILLIN/SULBACTAM 16 INTERMEDIATE Intermediate     PIP/TAZO >=128 RESISTANT Resistant ug/mL    * >=100,000 COLONIES/mL KLEBSIELLA PNEUMONIAE    Procedures/Studies: US  EKG SITE RITE Result Date: 11/20/2023 If Site Rite image not attached, placement could not be confirmed due to current cardiac rhythm.  CARDIAC CATHETERIZATION Result Date: 11/18/2023 Findings: RA = 13 RV = 62/16 PA = 59/34 (48) PCW = 33 Fick cardiac output/index = 4.0/1.9 Thermo CO/CI = 3.8/1.8 PVR = 3.75 (Fick) 4.0 (TD) Ao sat = 97% PA sat = 45%, 45% PAPi = 1.9 Assessment: 1. Markedly elevated filling pressures with severely decrease cardiac output 2. 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 Jules Oar, MD 10:01 AM  ECHOCARDIOGRAM LIMITED Result Date: 11/16/2023    ECHOCARDIOGRAM LIMITED REPORT   Patient Name:   QUANIKA SOLEM Minkler Date of Exam: 11/16/2023 Medical Rec #:  [id number]         Height:       74.0 in Accession #:    [id number]        Weight:       184.7 lb Date of Birth:  03-Feb-1961        BSA:          2.101 m Patient Age:    62 years          BP:           126/77 mmHg Patient Gender: F                 HR:           92 bpm. Exam Location:  Inpatient Procedure: 2D Echo, 3D Echo, Cardiac Doppler, Limited Color Doppler, Limited            Echo and Intracardiac Opacification Agent (Both Spectral and Color            Flow Doppler were utilized during procedure). Indications:    CHF-Acute Systolic  History:        Patient has prior history of Echocardiogram examinations, most                 recent 11/05/2023. CHF, Stroke,  Arrythmias:LBBB,                 Signs/Symptoms:Shortness of Breath; Risk Factors:Hypertension  and Former Smoker.  Sonographer:    Travis Friedman Referring Phys: [id number] CAROLE N HALL IMPRESSIONS  1. Left ventricular ejection fraction, by estimation, is <20%. Left ventricular ejection fraction by 3D volume is 23 %. The left ventricle has severely decreased function. The left ventricle demonstrates global hypokinesis. The left ventricular internal  cavity size was severely dilated. Left ventricular diastolic parameters are indeterminate.  2. Right ventricular systolic function is mildly reduced. The right ventricular size is normal.  3. Left atrial size was severely dilated.  4. Right atrial size was mildly dilated.  5. A small pericardial effusion is present. The pericardial effusion is posterior to the left ventricle. There is no evidence of cardiac tamponade.  6. The mitral valve is normal in structure. Mild mitral valve regurgitation. No evidence of mitral stenosis.  7. The aortic valve is normal in structure. Aortic valve regurgitation is trivial. No aortic stenosis is present. FINDINGS  Left Ventricle: Left ventricular ejection fraction, by estimation, is <20%. Left ventricular ejection fraction by 3D volume is 23 %. The left ventricle has severely decreased function. The left ventricle demonstrates global hypokinesis. Definity  contrast agent was given IV to delineate the left ventricular endocardial borders. The left ventricular internal cavity size was severely dilated. There is no left ventricular hypertrophy. Left ventricular diastolic parameters are indeterminate. Right Ventricle: The right ventricular size is normal. No increase in right ventricular wall thickness. Right ventricular systolic function is mildly reduced. Left Atrium: Left atrial size was severely dilated. Right Atrium: Right atrial size was mildly dilated. Pericardium: A small pericardial effusion is present. The pericardial  effusion is posterior to the left ventricle. There is no evidence of cardiac tamponade. Mitral Valve: The mitral valve is normal in structure. Mild mitral valve regurgitation. No evidence of mitral valve stenosis. Tricuspid Valve: The tricuspid valve is normal in structure. Tricuspid valve regurgitation is not demonstrated. No evidence of tricuspid stenosis. Aortic Valve: The aortic valve is normal in structure. Aortic valve regurgitation is trivial. No aortic stenosis is present. Pulmonic Valve: The pulmonic valve was normal in structure. Pulmonic valve regurgitation is not visualized. No evidence of pulmonic stenosis. Aorta: The aortic root is normal in size and structure. IAS/Shunts: No atrial level shunt detected by color flow Doppler. Additional Comments: 3D was performed not requiring image post processing on an independent workstation and was abnormal.  LEFT VENTRICLE PLAX 2D LVIDd:         7.70 cm LVIDs:         7.10 cm LV PW:         1.10 cm         3D Volume EF LV IVS:        1.00 cm         LV 3D EF:    Left                                             ventricul                                             ar LV Volumes (MOD)  ejection LV vol d, MOD    389.0 ml                   fraction A2C:                                        by 3D LV vol d, MOD    419.0 ml                   volume is A4C:                                        23 %. LV vol s, MOD    334.0 ml A2C: LV vol s, MOD    346.0 ml      3D Volume EF: A4C:                           3D EF:        23 % LV SV MOD A2C:   55.0 ml       LV EDV:       344 ml LV SV MOD A4C:   419.0 ml      LV ESV:       265 ml LV SV MOD BP:    54.3 ml       LV SV:        79 ml RIGHT VENTRICLE          IVC RV Basal diam:  3.70 cm  IVC diam: 1.40 cm LEFT ATRIUM              Index        RIGHT ATRIUM           Index LA diam:        5.30 cm  2.52 cm/m   RA Area:     17.80 cm LA Vol (A2C):   132.0 ml 62.82 ml/m  RA Volume:   49.60 ml  23.60 ml/m  LA Vol (A4C):   86.8 ml  41.31 ml/m LA Biplane Vol: 116.0 ml 55.20 ml/m   AORTA Ao Root diam: 2.90 cm MITRAL VALVE MV Area (PHT): 5.66 cm MV Decel Time: 134 msec MV E velocity: 104.00 cm/s MV A velocity: 59.40 cm/s MV E/A ratio:  1.75 Jules Oar MD Electronically signed by Jules Oar MD Signature Date/Time: 11/16/2023/2:42:34 PM    Final    DG Chest Port 1 View Result Date: 11/16/2023 CLINICAL DATA:  Shortness of breath CHF. EXAM: PORTABLE CHEST 1 VIEW COMPARISON:  PA and lateral chest 10/07/2023 FINDINGS: There is a left chest dual lead pacing system with a third AID wire and stable wire insertions. The heart is enlarged. There is central vascular prominence, mild interstitial edema in the lung bases and small pleural effusions. There are atelectatic bands in both lower lung fields, elevated right hemidiaphragm and no focal airspace disease process is seen. The mediastinum is stable. There is calcification of the transverse aorta. No acute osseous findings. IMPRESSION: 1. Cardiomegaly with central vascular prominence, mild interstitial edema in the lung bases and small pleural effusions. Findings consistent with mild CHF or fluid overload. Similar findings on the prior study. 2. Atelectatic bands in both lower lung fields. 3. Aortic atherosclerosis. Electronically  Signed   By: Denman Fischer M.D.   On: 11/16/2023 02:48   ECHOCARDIOGRAM COMPLETE Result Date: 11/05/2023    ECHOCARDIOGRAM REPORT   Patient Name:   SHAKEETA GODETTE Vanevery Date of Exam: 11/05/2023 Medical Rec #:  [id number]         Height:       74.0 in Accession #:    [id number]        Weight:       184.8 lb Date of Birth:  [birth year]        BSA:          2.102 m Patient Age:    62 years          BP:           109/71 mmHg Patient Gender: F                 HR:           95 bpm. Exam Location:  Outpatient Procedure: 2D Echo, Cardiac Doppler and Color Doppler (Both Spectral and Color            Flow Doppler were utilized during procedure).  Indications:    Congestive heart failure  History:        Patient has prior history of Echocardiogram examinations. CHF,                 Stroke, Arrythmias:LBBB, Signs/Symptoms:Shortness of Breath;                 Risk Factors:Hypertension and Former Smoker.  Sonographer:    Willey Harrier Referring Phys: [id number] DANIEL R BENSIMHON IMPRESSIONS  1. Left ventricular ejection fraction, by estimation, is 20%. The left ventricle has severely decreased function. The left ventricle demonstrates global hypokinesis. The left ventricular internal cavity size was severely dilated. There is mild concentric left ventricular hypertrophy. Left ventricular diastolic parameters are indeterminate.  2. Right ventricular systolic function is normal. The right ventricular size is normal.  3. Left atrial size was moderately dilated.  4. A small pericardial effusion is present. The pericardial effusion is circumferential.  5. The mitral valve is normal in structure. Mild mitral valve regurgitation. No evidence of mitral stenosis.  6. The aortic valve is tricuspid. Aortic valve regurgitation is mild. No aortic stenosis is present.  7. Moderately dilated pulmonary artery.  8. The inferior vena cava is normal in size with greater than 50% respiratory variability, suggesting right atrial pressure of 3 mmHg.  9. Cannot exclude a small PFO. Comparison(s): Prior images reviewed side by side. Changes from prior study are noted. Mild increase in size of pericardial effusion. FINDINGS  Left Ventricle: Left ventricular ejection fraction, by estimation, is 20%. The left ventricle has severely decreased function. The left ventricle demonstrates global hypokinesis. Strain was performed and the global longitudinal strain is indeterminate. The left ventricular internal cavity size was severely dilated. There is mild concentric left ventricular hypertrophy. Left ventricular diastolic parameters are indeterminate. Right Ventricle: The right ventricular size is  normal. No increase in right ventricular wall thickness. Right ventricular systolic function is normal. Left Atrium: Left atrial size was moderately dilated. Right Atrium: Right atrial size was normal in size. Pericardium: A small pericardial effusion is present. The pericardial effusion is circumferential. Mitral Valve: The mitral valve is normal in structure. Mild mitral valve regurgitation. No evidence of mitral valve stenosis. MV peak gradient, 8.0 mmHg. The mean mitral valve gradient is 4.7 mmHg. Tricuspid Valve: The tricuspid valve is normal in  structure. Tricuspid valve regurgitation is not demonstrated. No evidence of tricuspid stenosis. Aortic Valve: The aortic valve is tricuspid. Aortic valve regurgitation is mild. No aortic stenosis is present. Aortic valve peak gradient measures 13.1 mmHg. Pulmonic Valve: The pulmonic valve was normal in structure. Pulmonic valve regurgitation is not visualized. No evidence of pulmonic stenosis. Aorta: The aortic root, ascending aorta and aortic arch are all structurally normal, with no evidence of dilitation or obstruction. Pulmonary Artery: The pulmonary artery is moderately dilated. Venous: The inferior vena cava is normal in size with greater than 50% respiratory variability, suggesting right atrial pressure of 3 mmHg. IAS/Shunts: Cannot exclude a small PFO. Additional Comments: 3D was performed not requiring image post processing on an independent workstation and was indeterminate. A device lead is visualized in the right ventricle and right atrium.  LEFT VENTRICLE PLAX 2D LVIDd:         7.00 cm LVIDs:         6.60 cm LV PW:         1.10 cm LV IVS:        1.10 cm LVOT diam:     2.00 cm LV SV:         65 LV SV Index:   31 LVOT Area:     3.14 cm  LV Volumes (MOD) LV vol d, MOD A2C: 311.0 ml LV vol d, MOD A4C: 315.0 ml LV vol s, MOD A2C: 220.0 ml LV vol s, MOD A4C: 279.0 ml LV SV MOD A2C:     91.0 ml LV SV MOD A4C:     315.0 ml LV SV MOD BP:      57.5 ml RIGHT  VENTRICLE          IVC RV Basal diam:  3.70 cm  IVC diam: 1.70 cm TAPSE (M-mode): 2.9 cm LEFT ATRIUM              Index        RIGHT ATRIUM           Index LA diam:        3.60 cm  1.71 cm/m   RA Area:     15.30 cm LA Vol (A2C):   115.0 ml 54.72 ml/m  RA Volume:   41.10 ml  19.56 ml/m LA Vol (A4C):   75.9 ml  36.11 ml/m LA Biplane Vol: 93.4 ml  44.44 ml/m  AORTIC VALVE AV Area (Vmax): 2.22 cm AV Vmax:        181.00 cm/s AV Peak Grad:   13.1 mmHg LVOT Vmax:      128.00 cm/s LVOT Vmean:     90.050 cm/s LVOT VTI:       0.208 m  AORTA Ao Root diam: 3.40 cm Ao Asc diam:  3.50 cm MITRAL VALVE MV Area (PHT): 4.86 cm     SHUNTS MV Area VTI:   2.84 cm     Systemic VTI:  0.21 m MV Peak grad:  8.0 mmHg     Systemic Diam: 2.00 cm MV Mean grad:  4.7 mmHg MV Vmax:       1.41 m/s MV Vmean:      100.0 cm/s MV Decel Time: 156 msec MV E velocity: 134.33 cm/s MV A velocity: 88.60 cm/s MV E/A ratio:  1.52 Gloriann Larger MD Electronically signed by Gloriann Larger MD Signature Date/Time: 11/05/2023/12:22:22 PM    Final      Time coordinating discharge: Over 30 minutes    Faith Homes, MD  [facility] 11/21/2023, 4:40 PM

## 2023-11-21 NOTE — Plan of Care (Signed)
  Problem: Clinical Measurements: Goal: Respiratory complications will improve Outcome: Progressing Goal: Cardiovascular complication will be avoided Outcome: Progressing   Problem: Activity: Goal: Risk for activity intolerance will decrease Outcome: Progressing   Problem: Nutrition: Goal: Adequate nutrition will be maintained Outcome: Progressing   Problem: Elimination: Goal: Will not experience complications related to urinary retention Outcome: Progressing   Problem: Pain Managment: Goal: General experience of comfort will improve and/or be controlled Outcome: Progressing   Problem: Safety: Goal: Ability to remain free from injury will improve Outcome: Progressing   Problem: Activity: Goal: Ability to return to baseline activity level will improve Outcome: Progressing   Problem: Cardiovascular: Goal: Ability to achieve and maintain adequate cardiovascular perfusion will improve Outcome: Progressing

## 2023-11-26 ENCOUNTER — Encounter: Payer: Self-pay | Admitting: Physical Therapy

## 2023-11-26 ENCOUNTER — Ambulatory Visit: Admitting: Physical Therapy

## 2023-11-26 DIAGNOSIS — M6281 Muscle weakness (generalized): Secondary | ICD-10-CM

## 2023-11-26 DIAGNOSIS — R2689 Other abnormalities of gait and mobility: Secondary | ICD-10-CM | POA: Diagnosis present

## 2023-11-26 DIAGNOSIS — R2681 Unsteadiness on feet: Secondary | ICD-10-CM

## 2023-11-26 NOTE — Therapy (Unsigned)
 OUTPATIENT PHYSICAL THERAPY EVALUATION   Patient Name: Lynn Estrada MRN: [id number] DOB:07-13-1960, 63 y.o., female Today's Date: 11/27/2023  END OF SESSION:    PT End of Session - 11/26/23 1017     Visit Number 3    Date for PT Re-Evaluation 11/01/23    Authorization Type [place] MEDICAID [facility]    Authorization Time Period Approved 12 visits 11/07/23-12/20/23    PT Start Time 1015    PT Stop Time 1056    PT Time Calculation (min) 41 min    Activity Tolerance Patient limited by fatigue    Behavior During Therapy [facility] for tasks assessed/performed             Past Medical History:  Diagnosis Date   Anemia    Arthritis    CHF (congestive heart failure) (HCC) 12/06/2022   EF is 20% to 25 %   COPD (chronic obstructive pulmonary disease) (HCC)    Diabetes mellitus without complication (HCC)    Eczema    GERD (gastroesophageal reflux disease)    History of adenomatous polyp of colon    08-03-2016  tubular adenoma x2 and hyperplastic   History of chronic gastritis 08/03/2016   History of ectopic pregnancy 1988   s/p  left salpingectomy   History of endometriosis    History of kidney stones    History of partial nephrectomy    right pelvis for very large stone   History of pneumonia 07/02/2016   CAP   History of sepsis    07-01-2016  sepsis w/ pyelonephritis, CAP /   12-31-2016  urosepsis w/ kidney stone obstruction   History of uterine leiomyoma    Hx of adenomatous colonic polyps 08/03/2016   2 adenomas, 1 hpp and 1 lost polyp 2018 all < 1 cm    Hyperlipidemia    Hypertension    Left ureteral stone    Myocardial infarction [facility])    Nephrolithiasis    left obstructive stone and right non-obstructive stone  per CT 12-31-2016   Urgency of urination    Past Surgical History:  Procedure Laterality Date   ABDOMINAL HYSTERECTOMY  01/20/2007   w/ Lysis Adhesions/  Left salpingoophorectomy/  Right Salpingectomy   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    Procedure: BIV ICD INSERTION CRT-D;  Surgeon: Efraim Grange, MD;  [facility]: [facility];  Service: Cardiovascular;  Laterality: N/A;   CHOLECYSTECTOMY  1994   and Right Partial Nephrectomy (pelvis for very large stone)   COLONOSCOPY WITH PROPOFOL  N/A 03/26/2023   Procedure: COLONOSCOPY WITH PROPOFOL ;  Surgeon: Kenney Peacemaker, MD;  Location: [facility];  Service: Gastroenterology;  Laterality: N/A;   CYSTOSCOPY W/ URETERAL STENT PLACEMENT Left 12/31/2016   Procedure: CYSTOSCOPY WITH RETROGRADE PYELOGRAM/ LEFT URETERAL STENT PLACEMENT;  Surgeon: Osborn Blaze, MD;  Location: [facility];  Service: Urology;  Laterality: Left;   CYSTOSCOPY WITH RETROGRADE PYELOGRAM, URETEROSCOPY AND STENT PLACEMENT Left 01/18/2017   Procedure: 1ST STAGE CYSTOSCOPY WITH RETROGRADE PYELOGRAM, URETEROSCOPY AND STENT REPLACEMENT;  Surgeon: Osborn Blaze, MD;  Location: [facility];  Service: Urology;  Laterality: Left;   CYSTOSCOPY WITH RETROGRADE PYELOGRAM, URETEROSCOPY AND STENT PLACEMENT Left 02/01/2017   Procedure: 2ND STAGE CYSTOSCOPY WITH RETROGRADE PYELOGRAM, URETEROSCOPY AND STENT REPLACEMENT;  Surgeon: Osborn Blaze, MD;  Location: [facility];  Service: Urology;  Laterality: Left;   ECTOPIC PREGNANCY SURGERY  1988   Left Salpingectomy   HOLMIUM LASER APPLICATION Left 01/18/2017   Procedure: HOLMIUM LASER APPLICATION;  Surgeon: Osborn Blaze, MD;  Location: [facility];  Service: Urology;  Laterality: Left;   HOLMIUM LASER APPLICATION Left 02/01/2017   Procedure: HOLMIUM LASER APPLICATION;  Surgeon: Osborn Blaze, MD;  Location: [facility];  Service: Urology;  Laterality: Left;   LUMBAR DISC SURGERY  01/1999   right L5 -- S1   POLYPECTOMY  03/26/2023   Procedure: POLYPECTOMY;  Surgeon: Kenney Peacemaker, MD;  Location: [facility];  Service: Gastroenterology;;   RE-EXPLORATION LUMBAR/  LAMINECTOMY AND MICRODISKECTOMY  10/14/2001   right L5  -- S1   RIGHT HEART CATH N/A 05/23/2022   Procedure: RIGHT HEART CATH;  Surgeon: Mardell Shade, MD;  [facility]: [facility];  Service: Cardiovascular;  Laterality: N/A;   RIGHT HEART CATH N/A 10/11/2023   Procedure: RIGHT HEART CATH;  Surgeon: Alwin Baars, DO;  Location: [facility];  Service: Cardiovascular;  Laterality: N/A;   RIGHT HEART CATH N/A 11/18/2023   Procedure: RIGHT HEART CATH;  Surgeon: Mardell Shade, MD;  [facility]: [facility];  Service: Cardiovascular;  Laterality: N/A;   RIGHT/LEFT HEART CATH AND CORONARY ANGIOGRAPHY N/A 07/18/2021   Procedure: RIGHT/LEFT HEART CATH AND CORONARY ANGIOGRAPHY;  Surgeon: Mardell Shade, MD;  [facility]: [facility];  Service: Cardiovascular;  Laterality: N/A;   TUBAL LIGATION Bilateral 10/17/1999   PPTL   UMBILICAL HERNIA REPAIR  age 57   Patient Active Problem List   Diagnosis Date Noted   History of CVA (cerebrovascular accident) 11/20/2023   Acute on chronic systolic (congestive) heart failure (HCC) 11/16/2023   SOB (shortness of breath) 10/08/2023   History of pulmonary embolism 10/08/2023   DM2 (diabetes mellitus, type 2) (HCC) 10/08/2023   Anemia of chronic disease 10/08/2023   Aortic atherosclerosis (HCC) 10/01/2023   Atherosclerosis of coronary artery 10/01/2023   Pulmonary emboli (HCC) 09/30/2023   HCAP (healthcare-associated pneumonia) 09/12/2023   Acute on chronic combined systolic and diastolic CHF (congestive heart failure) (HCC) 08/23/2023   Frequency of urination and polyuria 08/23/2023   Cerebrovascular accident (CVA) (HCC) 05/26/2023   Pyuria 05/25/2023   Left-sided weakness 05/24/2023   Epistaxis 04/22/2023   Benign neoplasm of cecum 03/26/2023   Benign neoplasm of descending colon 03/26/2023   Benign neoplasm of sigmoid colon 03/26/2023   Adhesive capsulitis of right shoulder 08/24/2022   Mass of right hand 02/22/2022   LBBB (left bundle branch block) 02/01/2022   Type 2  diabetes mellitus with hyperglycemia, without long-term current use of insulin  (HCC) 01/22/2022   HLD (hyperlipidemia) 01/21/2022   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 10/26/2021   Chronic combined systolic and diastolic heart failure (HCC) 07/27/2021   Acute combined systolic (congestive) and diastolic (congestive) heart failure (HCC)    Hypocalcemia 07/12/2021   Elevated troponin 07/12/2021   Lipoma of hand 07/20/2020   Osteoarthritis of carpometacarpal (CMC) joint of thumb 07/20/2020   Pain in right hand 07/20/2020   Radial styloid tenosynovitis 07/20/2020   Other osteoarthritis of spine 10/23/2019   Suspected COVID-19 virus infection 06/11/2019   Acute respiratory failure with hypoxia (HCC) 06/11/2019   Multifocal pneumonia 06/11/2019   Carpal tunnel syndrome, bilateral 04/24/2019   Ureteral stone with hydronephrosis 12/31/2016   Hx of adenomatous colonic polyps 08/03/2016   Normocytic anemia 07/10/2016   CAP (community acquired pneumonia) 07/06/2016   AKI (acute kidney injury) (HCC) 07/02/2016   Essential hypertension 07/26/2015   Eczema 07/26/2015   Overweight (BMI 25.0-29.9) 07/26/2015  PCP: Aldine Humphreys, PA  REFERRING PROVIDER: Haydee Lipa, MD   REFERRING DIAG:  J18.9 (ICD-10-CM) - Pneumonia of right lower lobe due to infectious organism  R53.1 (ICD-10-CM) - Weakness    THERAPY DIAG:  Other abnormalities of gait and mobility  Muscle weakness (generalized)  Unsteadiness on feet  Rationale for Evaluation and Treatment: Rehabilitation  ONSET DATE: 10/15/23  SUBJECTIVE:   SUBJECTIVE STATEMENT: 11/27/2023: Pt reports that she was admitted to the hospital for HF last week.  She now has a PICC line with milrinone pump.  She states that she was specifically encouraged to continue PT per her cardiologist (this was subsequently confirmed by phone call to cardiologist office).  EVAL:  Patient reports to PT following several hospital  stays with report of pulmonary embolism. Her last hospital admission was on 10/15/23, including rehab for about 1 month. She states that she has been able to switch from the rollator to her cane since then. She was recently able to walk 20 minutes in the grocery store with the shopping cart, but states that it was completely exhausting.   PERTINENT HISTORY: Relevant PMHx includes Anemia, Arthritis, CHF, COPD, DM, HTN, Myocardial infarction, Pulmonary Emboli, CVA,   PAIN:  Are you having pain? No   PRECAUTIONS: Fall; Heart failure (does not tolerate reclined positions), PICC line  RED FLAGS: None   WEIGHT BEARING RESTRICTIONS: No  FALLS:  Has patient fallen in last 6 months? No  LIVING ENVIRONMENT: Lives with: lives with their spouse Lives in: House/apartment Has following equipment at home: Single point cane and Environmental consultant - 4 wheeled   PLOF: Independent  PATIENT GOALS: "I want to be able to walk long distance (1 miles) at the park without getting as winded/exhausted. I want to be able to do cooking and cleaning without being exhausting."  NEXT MD VISIT: Not scheduled at time of eval   OBJECTIVE:  Note: Objective measures were completed at Evaluation unless otherwise noted.   PATIENT SURVEYS:  Patient-Specific Functional Scale  1. Walking long distance 4/10   2. Cooking a meal 4/10  3. Cleaning (household) 3/10  Total: 11/30 Average: 3.67  COGNITION: Overall cognitive status: Within functional limits for tasks assessed      UPPER EXTREMITY MMT:  MMT Right eval Left eval  Shoulder flexion 4- 4-  Shoulder extension    Shoulder abduction 3+ 4-  Shoulder adduction    Shoulder extension    Shoulder internal rotation 4 4  Shoulder external rotation 4 4  Middle trapezius    Lower trapezius    Elbow flexion 4+ 4+  Elbow extension 4+ 4+  Wrist flexion    Wrist extension    Wrist ulnar deviation    Wrist radial deviation    Wrist pronation    Wrist supination     Grip strength     (Blank rows = not tested)   LOWER EXTREMITY MMT:  MMT Right eval Left eval  Hip flexion 4+ 4+  Hip extension    Hip abduction 4+ 4+  Hip adduction 4+ 4+  Hip internal rotation    Hip external rotation    Knee flexion 4+ 4+  Knee extension 4+ 4+  Ankle dorsiflexion    Ankle plantarflexion    Ankle inversion    Ankle eversion     (Blank rows = not tested)  FUNCTIONAL TESTS:  5 times sit to stand: 16 sec  2 minute walk test: 175 ft, stopped after 90 seconds to rest  Gait  Speed: 0.30m/s = indicating risk of future falls/hospitalization   GAIT: Distance walked: 175 feet Assistive device utilized: Single point cane Level of assistance: Complete Independence                                                                                                                                TREATMENT DATE:   [facility] Adult PT Treatment  11/14/2023:    O2 sat: 96%+ w/ HR ranging from 78-110 during exercise - monitored throughout  Therapeutic Exercise: nu-step L3 41m while taking subjective and planning session with patient  LAQ - 2.5# - 2x10 Seated heel raises - 3x10 RTB HS HS curl - 2x10 ea Seated RTB abd - 2x10 Seated Hip adduction: 2x10 - 5''  Therapeutic Activity  Education regarding importance of monitoring weight at home and reporting any change of >2 lbs    HOME EXERCISE PROGRAM: To be provided at f/u visit   ASSESSMENT:  CLINICAL IMPRESSION:  11/27/2023:  [place] reports to PT with PICC line and milrinone pump today after hospital admission for HF.  She is now on the heart transplant wait list at [facility]  She reports that her cardiologist instructed her to continue "conditioning" in PT as she will have to undergo a stress test prior to heart transplant.  Clearance for PT was subsequently confirmed by phone call to cardiologist.  We completed seated gentle exercise today with HR and 02 sat monitored throughout.  With new medication Deyona reports  lower overall fatigue and better activity tolerance.  EVAL:  Patient is a 63 y.o. female who was seen today for physical therapy evaluation and treatment for general weakness following several hospital admissions. She is demonstrating slow gait speed that is indicative of risk for future falls and hospitalization. She also has diminished BIL MMT scores throughout upper and lower extremities. She has related pain and difficulty with prolonged walking and performance of normal ADLs/IADLs. She requires skilled PT services at this time to address relevant deficits and improve overall function.     OBJECTIVE IMPAIRMENTS: decreased activity tolerance, decreased balance, decreased endurance, and decreased strength.   ACTIVITY LIMITATIONS: carrying, lifting, bending, standing, squatting, stairs, transfers, and locomotion level  PARTICIPATION LIMITATIONS: meal prep, cleaning, laundry, shopping, and community activity  PERSONAL FACTORS: Past/current experiences, Time since onset of injury/illness/exacerbation, and 3+ comorbidities: Relevant PMHx includes Anemia, Arthritis, CHF, COPD, DM, HTN, Myocardial infarction, Pulmonary Emboli, CVA are also affecting patient's functional outcome.   REHAB POTENTIAL: Fair    CLINICAL DECISION MAKING: Evolving/moderate complexity  EVALUATION COMPLEXITY: Moderate   GOALS: Goals reviewed with patient? YES  SHORT TERM GOALS: Target date: 12/05/2023    Patient will be independent with initial home program at least 3 days/week.  Baseline: provided at eval Goal Status: INITIAL   2.   Patient will demonstrate improved walking tolerance to at least 225 ft in . Baseline: limited to walking <90 seconds (175 ft)  Goal status: INITIAL  LONG TERM GOALS: Target date: 01/03/2024    Patient will report improved overall functional ability with PSFS total score of 20/30 or greater.  Baseline: 11/30 Goal Status: INITIAL    2.  Patient will demonstrate ability  to tolerate at least 15 minutes of walking prior to seated rest break.  Baseline: Unable to tolerate >2 minutes  Goal status: INITIAL  3.  Patient will demonstrate at least 4+/5 UE MMT scores. Baseline: see objective measures  Goal status: INITIAL  4.  Patient will demonstrate ability to perform 5x STS in less than 12 seconds.  Baseline: 16s Goal status: INITIAL  5.  Patient will demonstrate ability to perform at least 15 minutes of standing exercises with concurrent UE exercises in order to have improved tolerance of household cleaning.  Goal status: INITIAL    PLAN:  PT FREQUENCY: 1-2x/week  PT DURATION: 8 weeks  PLANNED INTERVENTIONS: [id number]- PT Re-evaluation, 97750- Physical Performance Testing, 97110-Therapeutic exercises, 97530- Therapeutic activity, [id number]- Neuromuscular re-education, 97535- Self Care, [id number]- Manual therapy, [phone]- Gait training, [phone]- Aquatic Therapy, Patient/Family education, Balance training, and Stair training  [employer] Authorization   Choose one: Rehabilitative  Standardized Assessment or Functional Outcome Tool: See Pain Assessment and Other PFPS  Score or Percent Disability: 11/30  Body Parts Treated (Select each separately):  Lumbopelvic. Overall deficits/functional limitations for body part selected: moderate   If treatment provided at initial evaluation, no treatment charged due to lack of authorization.     PLAN FOR NEXT SESSION: balance assessment, DGI or FGA assessment, address UQ and LQ strengthening, aerobic activity, patient education and HEP and progression towards functional goals    Donovan Gallant Ceil Roderick PT 11/27/2023 8:30 AM

## 2023-11-28 ENCOUNTER — Ambulatory Visit: Admitting: Physical Therapy

## 2023-11-28 ENCOUNTER — Encounter: Payer: Self-pay | Admitting: Physical Therapy

## 2023-11-28 DIAGNOSIS — M6281 Muscle weakness (generalized): Secondary | ICD-10-CM

## 2023-11-28 DIAGNOSIS — R2689 Other abnormalities of gait and mobility: Secondary | ICD-10-CM | POA: Diagnosis not present

## 2023-11-28 NOTE — Therapy (Signed)
 OUTPATIENT PHYSICAL THERAPY TREATMENT   Patient Name: Lynn Estrada MRN: [id number] DOB:Jul 27, 1960, 63 y.o., female Today's Date: 11/28/2023  END OF SESSION:    PT End of Session - 11/28/23 1040     Visit Number 4    Number of Visits 9    Date for PT Re-Evaluation 01/03/24   updated to reflect 5/10 recertification   Authorization Type [place] [employer] [facility]    Authorization Time Period Approved 12 visits 11/07/23-12/20/23    PT Start Time 1042    PT Stop Time 1125    PT Time Calculation (min) 43 min    Activity Tolerance Patient limited by fatigue             Past Medical History:  Diagnosis Date   Anemia    Arthritis    CHF (congestive heart failure) (HCC) 12/06/2022   EF is 20% to 25 %   COPD (chronic obstructive pulmonary disease) (HCC)    Diabetes mellitus without complication (HCC)    Eczema    GERD (gastroesophageal reflux disease)    History of adenomatous polyp of colon    08-03-2016  tubular adenoma x2 and hyperplastic   History of chronic gastritis 08/03/2016   History of ectopic pregnancy 1988   s/p  left salpingectomy   History of endometriosis    History of kidney stones    History of partial nephrectomy    right pelvis for very large stone   History of pneumonia 07/02/2016   CAP   History of sepsis    07-01-2016  sepsis w/ pyelonephritis, CAP /   12-31-2016  urosepsis w/ kidney stone obstruction   History of uterine leiomyoma    Hx of adenomatous colonic polyps 08/03/2016   2 adenomas, 1 hpp and 1 lost polyp 2018 all < 1 cm    Hyperlipidemia    Hypertension    Left ureteral stone    Myocardial infarction [facility])    Nephrolithiasis    left obstructive stone and right non-obstructive stone  per CT 12-31-2016   Urgency of urination    Past Surgical History:  Procedure Laterality Date   ABDOMINAL HYSTERECTOMY  01/20/2007   w/ Lysis Adhesions/  Left salpingoophorectomy/  Right Salpingectomy   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    Procedure: BIV ICD INSERTION CRT-D;  Surgeon: Efraim Grange, MD;  Location: [facility];  Service: Cardiovascular;  Laterality: N/A;   CHOLECYSTECTOMY  1994   and Right Partial Nephrectomy (pelvis for very large stone)   COLONOSCOPY WITH PROPOFOL  N/A 03/26/2023   Procedure: COLONOSCOPY WITH PROPOFOL ;  Surgeon: Kenney Peacemaker, MD;  Location: [facility];  Service: Gastroenterology;  Laterality: N/A;   CYSTOSCOPY W/ URETERAL STENT PLACEMENT Left 12/31/2016   Procedure: CYSTOSCOPY WITH RETROGRADE PYELOGRAM/ LEFT URETERAL STENT PLACEMENT;  Surgeon: Osborn Blaze, MD;  Location: [facility];  Service: Urology;  Laterality: Left;   CYSTOSCOPY WITH RETROGRADE PYELOGRAM, URETEROSCOPY AND STENT PLACEMENT Left 01/18/2017   Procedure: 1ST STAGE CYSTOSCOPY WITH RETROGRADE PYELOGRAM, URETEROSCOPY AND STENT REPLACEMENT;  Surgeon: Osborn Blaze, MD;  Location: [facility];  Service: Urology;  Laterality: Left;   CYSTOSCOPY WITH RETROGRADE PYELOGRAM, URETEROSCOPY AND STENT PLACEMENT Left 02/01/2017   Procedure: 2ND STAGE CYSTOSCOPY WITH RETROGRADE PYELOGRAM, URETEROSCOPY AND STENT REPLACEMENT;  Surgeon: Osborn Blaze, MD;  Location: [facility];  Service: Urology;  Laterality: Left;   ECTOPIC PREGNANCY SURGERY  1988   Left Salpingectomy   HOLMIUM LASER APPLICATION Left 01/18/2017   Procedure:  HOLMIUM LASER APPLICATION;  Surgeon: Osborn Blaze, MD;  Location: [facility];  Service: Urology;  Laterality: Left;   HOLMIUM LASER APPLICATION Left 02/01/2017   Procedure: HOLMIUM LASER APPLICATION;  Surgeon: Osborn Blaze, MD;  Location: [facility];  Service: Urology;  Laterality: Left;   LUMBAR DISC SURGERY  01/1999   right L5 -- S1   POLYPECTOMY  03/26/2023   Procedure: POLYPECTOMY;  Surgeon: Kenney Peacemaker, MD;  Location: [facility];  Service: Gastroenterology;;   RE-EXPLORATION LUMBAR/  LAMINECTOMY AND MICRODISKECTOMY  10/14/2001   right L5  -- S1   RIGHT HEART CATH N/A 05/23/2022   Procedure: RIGHT HEART CATH;  Surgeon: Mardell Shade, MD;  Location: [facility];  Service: Cardiovascular;  Laterality: N/A;   RIGHT HEART CATH N/A 10/11/2023   Procedure: RIGHT HEART CATH;  Surgeon: Alwin Baars, DO;  Location: [facility];  Service: Cardiovascular;  Laterality: N/A;   RIGHT HEART CATH N/A 11/18/2023   Procedure: RIGHT HEART CATH;  Surgeon: Mardell Shade, MD;  Location: [facility];  Service: Cardiovascular;  Laterality: N/A;   RIGHT/LEFT HEART CATH AND CORONARY ANGIOGRAPHY N/A 07/18/2021   Procedure: RIGHT/LEFT HEART CATH AND CORONARY ANGIOGRAPHY;  Surgeon: Mardell Shade, MD;  Location: [facility];  Service: Cardiovascular;  Laterality: N/A;   TUBAL LIGATION Bilateral 10/17/1999   PPTL   UMBILICAL HERNIA REPAIR  age 55   Patient Active Problem List   Diagnosis Date Noted   History of CVA (cerebrovascular accident) 11/20/2023   Acute on chronic systolic (congestive) heart failure (HCC) 11/16/2023   SOB (shortness of breath) 10/08/2023   History of pulmonary embolism 10/08/2023   DM2 (diabetes mellitus, type 2) (HCC) 10/08/2023   Anemia of chronic disease 10/08/2023   Aortic atherosclerosis (HCC) 10/01/2023   Atherosclerosis of coronary artery 10/01/2023   Pulmonary emboli (HCC) 09/30/2023   HCAP (healthcare-associated pneumonia) 09/12/2023   Acute on chronic combined systolic and diastolic CHF (congestive heart failure) (HCC) 08/23/2023   Frequency of urination and polyuria 08/23/2023   Cerebrovascular accident (CVA) (HCC) 05/26/2023   Pyuria 05/25/2023   Left-sided weakness 05/24/2023   Epistaxis 04/22/2023   Benign neoplasm of cecum 03/26/2023   Benign neoplasm of descending colon 03/26/2023   Benign neoplasm of sigmoid colon 03/26/2023   Adhesive capsulitis of right shoulder 08/24/2022   Mass of right hand 02/22/2022   LBBB (left bundle branch block) 02/01/2022   Type 2  diabetes mellitus with hyperglycemia, without long-term current use of insulin  (HCC) 01/22/2022   HLD (hyperlipidemia) 01/21/2022   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 10/26/2021   Chronic combined systolic and diastolic heart failure (HCC) 07/27/2021   Acute combined systolic (congestive) and diastolic (congestive) heart failure (HCC)    Hypocalcemia 07/12/2021   Elevated troponin 07/12/2021   Lipoma of hand 07/20/2020   Osteoarthritis of carpometacarpal (CMC) joint of thumb 07/20/2020   Pain in right hand 07/20/2020   Radial styloid tenosynovitis 07/20/2020   Other osteoarthritis of spine 10/23/2019   Suspected COVID-19 virus infection 06/11/2019   Acute respiratory failure with hypoxia (HCC) 06/11/2019   Multifocal pneumonia 06/11/2019   Carpal tunnel syndrome, bilateral 04/24/2019   Ureteral stone with hydronephrosis 12/31/2016   Hx of adenomatous colonic polyps 08/03/2016   Normocytic anemia 07/10/2016   CAP (community acquired pneumonia) 07/06/2016   AKI (acute kidney injury) (HCC) 07/02/2016   Essential hypertension 07/26/2015   Eczema 07/26/2015   Overweight (BMI 25.0-29.9)  07/26/2015    PCP: Aldine Humphreys, PA  REFERRING PROVIDER: Haydee Lipa, MD   REFERRING DIAG:  J18.9 (ICD-10-CM) - Pneumonia of right lower lobe due to infectious organism  R53.1 (ICD-10-CM) - Weakness    THERAPY DIAG:  Other abnormalities of gait and mobility  Muscle weakness (generalized)  Rationale for Evaluation and Treatment: Rehabilitation  ONSET DATE: 10/15/23  SUBJECTIVE:   SUBJECTIVE STATEMENT: 11/28/2023: states she is feeling good. Saw HH nursing this morning, sees them once a week. States breathing feeling pretty good today, does still get a [place] SOB with exertional fatigue. No other new updates.   EVAL:  Patient reports to PT following several hospital stays with report of pulmonary embolism. Her last hospital admission was on 10/15/23,  including rehab for about 1 month. She states that she has been able to switch from the rollator to her cane since then. She was recently able to walk 20 minutes in the grocery store with the shopping cart, but states that it was completely exhausting.   PERTINENT HISTORY: Relevant PMHx includes Anemia, Arthritis, CHF, COPD, DM, HTN, Myocardial infarction, Pulmonary Emboli, CVA,   PAIN:  Are you having pain? No   PRECAUTIONS: Fall; Heart failure (does not tolerate reclined positions), RUE PICC line, pacemaker/defibrillator   RED FLAGS: None   WEIGHT BEARING RESTRICTIONS: No  FALLS:  Has patient fallen in last 6 months? No  LIVING ENVIRONMENT: Lives with: lives with their spouse Lives in: House/apartment Has following equipment at home: Single point cane and Environmental consultant - 4 wheeled   PLOF: Independent  PATIENT GOALS: "I want to be able to walk long distance (1 miles) at the park without getting as winded/exhausted. I want to be able to do cooking and cleaning without being exhausting."  NEXT MD VISIT: Not scheduled at time of eval   OBJECTIVE:  Note: Objective measures were completed at Evaluation unless otherwise noted.   PATIENT SURVEYS:  Patient-Specific Functional Scale  1. Walking long distance 4/10   2. Cooking a meal 4/10  3. Cleaning (household) 3/10  Total: 11/30 Average: 3.67  COGNITION: Overall cognitive status: Within functional limits for tasks assessed      UPPER EXTREMITY MMT:  MMT Right eval Left eval  Shoulder flexion 4- 4-  Shoulder extension    Shoulder abduction 3+ 4-  Shoulder adduction    Shoulder extension    Shoulder internal rotation 4 4  Shoulder external rotation 4 4  Middle trapezius    Lower trapezius    Elbow flexion 4+ 4+  Elbow extension 4+ 4+  Wrist flexion    Wrist extension    Wrist ulnar deviation    Wrist radial deviation    Wrist pronation    Wrist supination    Grip strength     (Blank rows = not tested)   LOWER  EXTREMITY MMT:  MMT Right eval Left eval  Hip flexion 4+ 4+  Hip extension    Hip abduction 4+ 4+  Hip adduction 4+ 4+  Hip internal rotation    Hip external rotation    Knee flexion 4+ 4+  Knee extension 4+ 4+  Ankle dorsiflexion    Ankle plantarflexion    Ankle inversion    Ankle eversion     (Blank rows = not tested)  FUNCTIONAL TESTS:  5 times sit to stand: 16 sec  2 minute walk test: 175 ft, stopped after 90 seconds to rest  Gait Speed: 0.31m/s = indicating risk of future falls/hospitalization  GAIT: Distance walked: 175 feet Assistive device utilized: Single point cane Level of assistance: Complete Independence                                                                                                                                TREATMENT DATE:   [facility] Adult PT Treatment:                                                DATE: 11/28/23 Pre session vitals: HR 81-84 SpO2 98-99% RA Monitoring during session: HR 80s-108 SpO2 98-100% (some inconsistency w/ HR on pulse ox, corroborated with radial palpation) Post session: HR 88-89 SpO2 99%  Therapeutic Exercise: LAQ + ankle pump 2x10 BIL Seated heel/toe raises x10 Red band SL hip abd seated x8 BIL HEP discussion/education  Therapeutic Activity: STS 2x5 blocked practice, cues for pacing, posture, and breath control  Close monitoring of vitals throughout with education on how they relate to mobility, appropriate activity/exercise response, managing exertional fatigue, pacing, and activity modification    [facility] Adult PT Treatment  11/14/2023:    O2 sat: 96%+ w/ HR ranging from 78-110 during exercise - monitored throughout  Therapeutic Exercise: nu-step L3 74m while taking subjective and planning session with patient  LAQ - 2.5# - 2x10 Seated heel raises - 3x10 RTB HS HS curl - 2x10 ea Seated RTB abd - 2x10 Seated Hip adduction: 2x10 - 5''  Therapeutic Activity  Education regarding importance of monitoring  weight at home and reporting any change of >2 lbs    HOME EXERCISE PROGRAM: To be provided at f/u visit   ASSESSMENT:  CLINICAL IMPRESSION:  11/28/2023: Pt arrives w/o report of no new updates, breathing feeling good today. Today we focus on basic mobility exercises and blocked practice for transfers - increased time to focus on pacing and close monitoring of vitals/tolerance. Education throughout as above. Tolerates session well, no adverse events although does require prolonged rest breaks. Recommend continuing along current POC in order to address relevant deficits and improve functional tolerance. Pt departs today's session in no acute distress, all voiced questions/concerns addressed appropriately from PT perspective.     EVAL:  Patient is a 63 y.o. female who was seen today for physical therapy evaluation and treatment for general weakness following several hospital admissions. She is demonstrating slow gait speed that is indicative of risk for future falls and hospitalization. She also has diminished BIL MMT scores throughout upper and lower extremities. She has related pain and difficulty with prolonged walking and performance of normal ADLs/IADLs. She requires skilled PT services at this time to address relevant deficits and improve overall function.     OBJECTIVE IMPAIRMENTS: decreased activity tolerance, decreased balance, decreased endurance, and decreased strength.   ACTIVITY LIMITATIONS: carrying, lifting, bending, standing, squatting, stairs, transfers, and locomotion  level  PARTICIPATION LIMITATIONS: meal prep, cleaning, laundry, shopping, and community activity  PERSONAL FACTORS: Past/current experiences, Time since onset of injury/illness/exacerbation, and 3+ comorbidities: Relevant PMHx includes Anemia, Arthritis, CHF, COPD, DM, HTN, Myocardial infarction, Pulmonary Emboli, CVA are also affecting patient's functional outcome.   REHAB POTENTIAL: Fair    CLINICAL DECISION  MAKING: Evolving/moderate complexity  EVALUATION COMPLEXITY: Moderate   GOALS: Goals reviewed with patient? YES  SHORT TERM GOALS: Target date: 12/05/2023    Patient will be independent with initial home program at least 3 days/week.  Baseline: provided at eval Goal Status: INITIAL   2.   Patient will demonstrate improved walking tolerance to at least 225 ft in . Baseline: limited to walking <90 seconds (175 ft)  Goal status: INITIAL   LONG TERM GOALS: Target date: 01/03/2024    Patient will report improved overall functional ability with PSFS total score of 20/30 or greater.  Baseline: 11/30 Goal Status: INITIAL    2.  Patient will demonstrate ability to tolerate at least 15 minutes of walking prior to seated rest break.  Baseline: Unable to tolerate >2 minutes  Goal status: INITIAL  3.  Patient will demonstrate at least 4+/5 UE MMT scores. Baseline: see objective measures  Goal status: INITIAL  4.  Patient will demonstrate ability to perform 5x STS in less than 12 seconds.  Baseline: 16s Goal status: INITIAL  5.  Patient will demonstrate ability to perform at least 15 minutes of standing exercises with concurrent UE exercises in order to have improved tolerance of household cleaning.  Goal status: INITIAL    PLAN:  PT FREQUENCY: 1-2x/week  PT DURATION: 8 weeks  PLANNED INTERVENTIONS: [id number]- PT Re-evaluation, 97750- Physical Performance Testing, 97110-Therapeutic exercises, 97530- Therapeutic activity, [id number]- Neuromuscular re-education, 97535- Self Care, [id number]- Manual therapy, [phone]- Gait training, [phone]- Aquatic Therapy, Patient/Family education, Balance training, and Stair training  [employer] Authorization   Choose one: Rehabilitative  Standardized Assessment or Functional Outcome Tool: See Pain Assessment and Other PFPS  Score or Percent Disability: 11/30  Body Parts Treated (Select each separately):  Lumbopelvic. Overall deficits/functional  limitations for body part selected: moderate   If treatment provided at initial evaluation, no treatment charged due to lack of authorization.     PLAN FOR NEXT SESSION: continue with functional mobility, balance within tolerance. Monitoring vitals/tolerance closely. Mindful of PICC, pacemaker, and recent HF admission.   Lovett Ruck PT, DPT 11/28/2023 12:26 PM

## 2023-11-29 NOTE — Progress Notes (Signed)
 Remote ICD transmission.

## 2023-11-29 NOTE — Addendum Note (Signed)
 Addended by: Lott Rouleau A on: 11/29/2023 02:28 PM   Modules accepted: Orders

## 2023-12-02 ENCOUNTER — Other Ambulatory Visit (HOSPITAL_COMMUNITY): Payer: Self-pay

## 2023-12-02 NOTE — Progress Notes (Signed)
 Advanced Heart Failure Clinic Note   Referring Physician: PCP: Aldine Humphreys, PA PCP-Cardiologist: None  [facility] Cardiologist: Jules Oar, MD  Chief Complaint: f/u for chronic systolic heart failure  HPI:  Lynn Estrada is a 63 y.o. with a history of HTN, GERD, nephrolithiasis, partial right nephrectomy, LBBB, and combined diastolic/systolic HF.      Admitted 1/23 with acute HFrEF. 07/13/21 Echo 20-25%, RV normal, grade II DD , and septal - lateral dyssnchrony. R/L cath w/ Minimal CAD, mildly elevated filling pressures and normal CO. cMRI ? Myocarditis (see impression below). LVEF 18%.    Echo 10/25/21 EF 30-35%   RHC 11/23 CI 2.3   CPX 12/23 - submax test. Non diagnostic.  Time 2:45 FVC 2.14 (58% predicted)      FEV1 1.82 (64% predicted)        FEV1/FVC 85%        MVV 65 (59% predicted)  BP rest: 100/74 BP standing: 94/72 BP peak: 92/70  Peak VO2: 8.4 (40.9% predicted peak VO2)  VE/VCO2 slope:  33 Peak RER: 0.85    Underwent Abbott CRT-D on 01/14/23. Got good result with significant QRS narrowing.    Echo 11/24 EF 20-25%    Admitted in 11/24 with stroke like symptoms (aphasia) CTA head a showed a possible small distal ACA occlusion.  CT head was negative for stroke. 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  Echo 2/25 EF 20-25% RV ok    She was admitted from 3/31-10/02/23 for acute hypoxic respiratory failure in the setting of new  RLL PE and CHF. CTA of chest showed minimal clot burden. Echo not done. LE venous doppler showed superficial vein thrombosis of the left saphenous vein but no DVT. She was placed on Eliquis  and diuresed w/ IV Lasix . Entresto  was discontinued due to soft BP.    She presented back to the ED on 4/7 w/ worsening HF. Repeat CT of chest showed interval improvement of PE burden, diminished from initial scan. She was also c/w worsening PNA and trace rt pleural effusion. Respiratory panel negative.  PCT < 0.10. BNP elevated 916 (up from recent baseline). She was readmitted and placed on doxycyline and IV Lasix . AHF team consulted to assist w/ further management of CHF. She underwent RHC which demonstrated mildly elevated pre capillary filling pressures, severely elevated PCWP, normal CI by FICK but moderately reduced by TD 2.1 L/min/m2. She was diuresed w/ IV Lasix . GDMT limited by hypotension, requiring midodrine . Felt to be nearing end-stage HF but not current candidate for advanced therapies given recent pulmonary embolism as well as significant deconditioning. Would need extensive rehab and ambulation at home to be later considered. She was discharged home on 4/15. D/c wt 182 lb.   Seen in HF Clinic 10/24/23. Very weak. Felt to be overdiuresed. Diuretics held and bisoprolol  stopped. Scr 1.07 -> 3.38 LA 1.0 Told to come to the ER but she took herself to [facility]. Felt to be very volume depleted in setting of possible viral gastroenteritis. Given copious IVF.  Scr 1.4  spiro, digoxin  and torsemide  were held. Hospital RHC 10/11/23: RA 7, PA 50/31 (38), PCWP 31, PVR 1.1-1.6, PAPi 2.7. Placed on midodrine  for BP support.   Admitted 5/25 with end-stage HF. RHC 5/25: RA mean 13, PA 59/34 (48), PCWP 33, Fick CI 1.9, TD CI 1.8, PAPi 1.9, PVR 3.75 (Fick) and 4.0 (TD). Echo 11/16/23 EF < 20% RV mildly HK. Diuresed with IV lasix . Discharged  home with milrinone  0.25 mcg/kg/min. Plan for follow up with [facility] for transplant workup. (Referral sent 11/20/23)  Today she returns for post hospital follow up. Overall feeling great. Denies palpitations, CP, dizziness, edema, or PND/Orthopnea. No SOB. Appetite ok. No fever or chills. Weight at home 179-180 pounds. SBP at home 90s- low 100s. Taking all medications. Denies ETOH, tobacco or drug use. Has upcoming stay at [facility] June 30th- June 2nd. [facility] RN visits her on Thursdays for labs, infusion check and PICC line dressing changes.    Past Medical History:  Diagnosis Date   Anemia     Arthritis    CHF (congestive heart failure) (HCC) 12/06/2022   EF is 20% to 25 %   COPD (chronic obstructive pulmonary disease) (HCC)    Diabetes mellitus without complication (HCC)    Eczema    GERD (gastroesophageal reflux disease)    History of adenomatous polyp of colon    08-03-2016  tubular adenoma x2 and hyperplastic   History of chronic gastritis 08/03/2016   History of ectopic pregnancy 1988   s/p  left salpingectomy   History of endometriosis    History of kidney stones    History of partial nephrectomy    right pelvis for very large stone   History of pneumonia 07/02/2016   CAP   History of sepsis    07-01-2016  sepsis w/ pyelonephritis, CAP /   12-31-2016  urosepsis w/ kidney stone obstruction   History of uterine leiomyoma    Hx of adenomatous colonic polyps 08/03/2016   2 adenomas, 1 hpp and 1 lost polyp 2018 all < 1 cm    Hyperlipidemia    Hypertension    Left ureteral stone    Myocardial infarction [facility])    Nephrolithiasis    left obstructive stone and right non-obstructive stone  per CT 12-31-2016   Urgency of urination     Current Outpatient Medications  Medication Sig Dispense Refill   apixaban  (ELIQUIS ) 5 MG TABS tablet Take 1 tablet (5 mg total) by mouth 2 (two) times daily. 60 tablet 11   atorvastatin  (LIPITOR) 10 MG tablet Take 10 mg by mouth in the morning.     calcium  carbonate (TUMS - DOSED IN MG ELEMENTAL CALCIUM ) 500 MG chewable tablet Chew 1 tablet by mouth daily as needed for heartburn or indigestion.     JARDIANCE  10 MG TABS tablet Take 10 mg by mouth daily.     metFORMIN  (GLUCOPHAGE ) 500 MG tablet Take 1 tablet by mouth 2 (two) times daily with a meal.     midodrine  (PROAMATINE ) 5 MG tablet Take 1 tablet (5 mg total) by mouth 3 (three) times daily with meals. 270 tablet 0   milrinone  (PRIMACOR ) 20 MG/100 ML SOLN infusion Inject 0.25 mcg/kg/min into the vein continuous.     Multiple Vitamins-Minerals (ONE-A-DAY WOMENS PO) Take 1 tablet by  mouth daily with breakfast.     ondansetron  (ZOFRAN ) 4 MG tablet Take 4 mg by mouth every 8 (eight) hours as needed for nausea or vomiting.     potassium chloride  SA (KLOR-CON  M) 20 MEQ tablet Take 2 tablets (40 mEq total) by mouth daily. 30 tablet 3   torsemide  (DEMADEX ) 20 MG tablet Take 1 tablet (20 mg total) by mouth daily. 30 tablet 3   acetaminophen  (TYLENOL ) 500 MG tablet Take 500-1,000 mg by mouth every 6 (six) hours as needed (for pain.).     No current facility-administered medications for this encounter.    Allergies  Allergen Reactions   Latex Rash and Hives   Bactrim [Sulfamethoxazole -Trimethoprim] Rash   Carvedilol  Itching   Metoprolol  Itching and Rash    Social History   Socioeconomic History   Marital status: Married    Spouse name: Richard Morea   Number of children: 1   Years of education: college   Highest education level: Not on file  Occupational History   Occupation: Product manager  Tobacco Use   Smoking status: Former    Current packs/day: 0.00    Average packs/day: 0.8 packs/day for 5.0 years (3.8 ttl pk-yrs)    Types: Cigarettes    Start date: 01/11/1992    Quit date: 01/10/1997    Years since quitting: 26.9   Smokeless tobacco: Never  Vaping Use   Vaping status: Never Used  Substance and Sexual Activity   Alcohol use: Not Currently    Comment: social use; once a month   Drug use: No   Sexual activity: Yes    Partners: Male    Birth control/protection: Surgical  Other Topics Concern   Not on file  Social History Narrative   Lives with her husband and their daughter and their dog.   Social Drivers of Corporate investment banker Strain: Low Risk  (10/29/2023)   Received from [facility]   Overall Financial Resource Strain (CARDIA)    Difficulty of Paying Living Expenses: Not hard at all  Food Insecurity: Patient Declined (11/16/2023)   Hunger Vital Sign    Worried About Running Out of Food in the Last Year: Patient  declined    Ran Out of Food in the Last Year: Patient declined  Transportation Needs: Patient Declined (11/16/2023)   PRAPARE - Administrator, Civil Service (Medical): Patient declined    Lack of Transportation (Non-Medical): Patient declined  Physical Activity: Unknown (09/24/2023)   Received from [facility]   Exercise Vital Sign    Days of Exercise per Week: 0 days    Minutes of Exercise per Session: Not on file  Stress: No Stress Concern Present (09/24/2023)   Received from [facility] - Occupational Stress Questionnaire    Feeling of Stress : Not at all  Social Connections: Patient Declined (09/30/2023)   Social Connection and Isolation Panel [NHANES]    Frequency of Communication with Friends and Family: Patient declined    Frequency of Social Gatherings with Friends and Family: Patient declined    Attends Religious Services: Patient declined    Database administrator or Organizations: Patient declined    Attends Banker Meetings: Patient declined    Marital Status: Patient declined  Intimate Partner Violence: Patient Declined (11/16/2023)   Humiliation, Afraid, Rape, and Kick questionnaire    Fear of Current or Ex-Partner: Patient declined    Emotionally Abused: Patient declined    Physically Abused: Patient declined    Sexually Abused: Patient declined    Family History  Problem Relation Age of Onset   Sarcoidosis Mother    Prostate cancer Father    Hypertension Sister    Eczema Sister    Lupus Sister    Breast cancer Maternal Aunt    Gout Maternal Grandmother    Diabetes Maternal Grandmother        leg amputations   Stomach cancer Neg Hx    Colon cancer Neg Hx    Esophageal cancer Neg Hx    Rectal cancer Neg Hx  Vitals:   12/04/23 0942  BP: 102/74  Pulse: (!) 112  SpO2: 98%  Weight: 82.6 kg (182 lb)  Height: 6\' 2"  (1.88 m)    PHYSICAL EXAM: General:  well appearing.  No respiratory  difficulty. Walked into clinic.  Neck: supple. JVD flat.  Cor: PMI nondisplaced. Regular rate & rhythm. No rubs, gallops or murmurs. Lungs: clear Extremities: no cyanosis, clubbing, rash, edema. PICC RUE Neuro: alert & oriented x 3. Moves all 4 extremities w/o difficulty. Affect pleasant.   Device interrogation: AP 4.9%, VP 98%, no AT/AF, CorVue elevated at 9  EKG a sensed v paced 109 bpm  (Personally reviewed)    Wt Readings from Last 3 Encounters:  12/04/23 82.6 kg (182 lb)  11/21/23 80.6 kg (177 lb 9.6 oz)  10/29/23 83.8 kg (184 lb 12.8 oz)    ASSESSMENT & PLAN: 1. End-stage combined Systolic/Diastolic Heart Failure, NICM - LHC 1/23 with min CAD. cMRI possible myocarditis  LVEF 18% Suggestive of LBBB, RVEF 33% - Echo 4/23: EF 30-35% -> EF improving but still with significant LV dysfunction (suspect resolving myocarditis) - EF dropped again in 10/23. Stable at 20-25% with nl RV - s/p Abbott CRT-D 7/24 for LBBB  - Echo 2/25 EF 20-25% RV ok  - RHC 10/11/23: RA 7, PA 51/30 (38), PCWP 31, CO/CI(TD) 4.44/2.12=>>diuresed w/ IV Lasix . D/c wt 182 lb  - Admitted 3/25 and 4/25 with ADHF - Admitted to [facility] 4/25 with AK due to overdiuresis. Hydrated and GDMT held - RHC 5/25: RA mean 13, PA 59/34 (48), PCWP 33, Fick CI 1.9, TD CI 1.8, PAPi 1.9, PVR 3.75 (Fick) and 4.0 (TD)  - Echo 11/16/23 EF < 20% RV mildly HK  - NYHA IV - Now with end-stage HF on home milrinone  0.25 mcg/kg/min. Plan to follow up with [facility] at the end of this month. Personally checked milrinone  dose and pump running. No alarms.  - Restart digoxin  0.125 mg daily - Off spiro, Jardiance , Entresto  with recent AKI and hypotension.  - Continue Midodrine  5 mg TID - Continue Torsemide  20 mg daily +40 mEq daily. Volume mildly elevated on device check today. Asked her to take an extra dose of Torsemide  today and tomorrow.  - Follow closely and attempt to restart GDMT as tolerated - Can consider CPX in near future to assess for advanced  therapies - Labs reviewed from last Thursday, stable on review. Will gets labs again tomorrow.   2. PE - diagnosed 3/25, small burden RLL. LE venous dopplers + for SVT. No DVT   - Continue Eliquis . No bleeding. Hgb stable on recent labs.    3. HTN - controlled/ low normal  - no up titration to GDMT made today    4. LBBB - LBBB, QRS 176 [place] - now s/p Abbott CRT-D 7/24 - ICD interrogation today stable   5. Partial R Nephrectomy - b/l SCr 1.1  - stable on recent labs.    6. H/o CVA 11/24 - no significant residual deficits  - on statin per Neuro  - no longer on Plavix . On Eliquis   - No AF on device interrogation.  Follow up in ~6 weeks with Dr. Bensimhon.     Sheryl Donna, NP 12/04/23

## 2023-12-03 ENCOUNTER — Encounter: Payer: Self-pay | Admitting: Physical Therapy

## 2023-12-03 ENCOUNTER — Ambulatory Visit: Attending: Internal Medicine | Admitting: Physical Therapy

## 2023-12-03 ENCOUNTER — Telehealth (HOSPITAL_COMMUNITY): Payer: Self-pay

## 2023-12-03 DIAGNOSIS — M6281 Muscle weakness (generalized): Secondary | ICD-10-CM | POA: Insufficient documentation

## 2023-12-03 DIAGNOSIS — R2681 Unsteadiness on feet: Secondary | ICD-10-CM | POA: Insufficient documentation

## 2023-12-03 DIAGNOSIS — R2689 Other abnormalities of gait and mobility: Secondary | ICD-10-CM | POA: Diagnosis present

## 2023-12-03 NOTE — Therapy (Signed)
 OUTPATIENT PHYSICAL THERAPY TREATMENT   Patient Name: Lynn Estrada MRN: [id number] DOB:Nov 27, 1960, 63 y.o., female Today's Date: 12/03/2023  END OF SESSION:    PT End of Session - 12/03/23 1015     Visit Number 5    Number of Visits 9    Date for PT Re-Evaluation 01/03/24   updated to reflect 5/10 recertification   Authorization Type [place] MEDICAID [facility]    Authorization Time Period Approved 12 visits 11/07/23-12/20/23    PT Start Time 1015    PT Stop Time 1057    PT Time Calculation (min) 42 min    Activity Tolerance Patient limited by fatigue             Past Medical History:  Diagnosis Date   Anemia    Arthritis    CHF (congestive heart failure) (HCC) 12/06/2022   EF is 20% to 25 %   COPD (chronic obstructive pulmonary disease) (HCC)    Diabetes mellitus without complication (HCC)    Eczema    GERD (gastroesophageal reflux disease)    History of adenomatous polyp of colon    08-03-2016  tubular adenoma x2 and hyperplastic   History of chronic gastritis 08/03/2016   History of ectopic pregnancy 1988   s/p  left salpingectomy   History of endometriosis    History of kidney stones    History of partial nephrectomy    right pelvis for very large stone   History of pneumonia 07/02/2016   CAP   History of sepsis    07-01-2016  sepsis w/ pyelonephritis, CAP /   12-31-2016  urosepsis w/ kidney stone obstruction   History of uterine leiomyoma    Hx of adenomatous colonic polyps 08/03/2016   2 adenomas, 1 hpp and 1 lost polyp 2018 all < 1 cm    Hyperlipidemia    Hypertension    Left ureteral stone    Myocardial infarction [facility])    Nephrolithiasis    left obstructive stone and right non-obstructive stone  per CT 12-31-2016   Urgency of urination    Past Surgical History:  Procedure Laterality Date   ABDOMINAL HYSTERECTOMY  01/20/2007   w/ Lysis Adhesions/  Left salpingoophorectomy/  Right Salpingectomy   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    Procedure: BIV ICD INSERTION CRT-D;  Surgeon: Efraim Grange, MD;  [facility];  Service: Cardiovascular;  Laterality: N/A;   CHOLECYSTECTOMY  1994   and Right Partial Nephrectomy (pelvis for very large stone)   COLONOSCOPY WITH PROPOFOL  N/A 03/26/2023   Procedure: COLONOSCOPY WITH PROPOFOL ;  Surgeon: Kenney Peacemaker, MD;  Location: [facility];  Service: Gastroenterology;  Laterality: N/A;   CYSTOSCOPY W/ URETERAL STENT PLACEMENT Left 12/31/2016   Procedure: CYSTOSCOPY WITH RETROGRADE PYELOGRAM/ LEFT URETERAL STENT PLACEMENT;  Surgeon: Osborn Blaze, MD;  Location: [facility];  Service: Urology;  Laterality: Left;   CYSTOSCOPY WITH RETROGRADE PYELOGRAM, URETEROSCOPY AND STENT PLACEMENT Left 01/18/2017   Procedure: 1ST STAGE CYSTOSCOPY WITH RETROGRADE PYELOGRAM, URETEROSCOPY AND STENT REPLACEMENT;  Surgeon: Osborn Blaze, MD;  Location: [facility];  Service: Urology;  Laterality: Left;   CYSTOSCOPY WITH RETROGRADE PYELOGRAM, URETEROSCOPY AND STENT PLACEMENT Left 02/01/2017   Procedure: 2ND STAGE CYSTOSCOPY WITH RETROGRADE PYELOGRAM, URETEROSCOPY AND STENT REPLACEMENT;  Surgeon: Osborn Blaze, MD;  Location: [facility];  Service: Urology;  Laterality: Left;   ECTOPIC PREGNANCY SURGERY  1988   Left Salpingectomy   HOLMIUM LASER APPLICATION Left 01/18/2017   Procedure:  HOLMIUM LASER APPLICATION;  Surgeon: Osborn Blaze, MD;  Location: [facility];  Service: Urology;  Laterality: Left;   HOLMIUM LASER APPLICATION Left 02/01/2017   Procedure: HOLMIUM LASER APPLICATION;  Surgeon: Osborn Blaze, MD;  Location: [facility];  Service: Urology;  Laterality: Left;   LUMBAR DISC SURGERY  01/1999   right L5 -- S1   POLYPECTOMY  03/26/2023   Procedure: POLYPECTOMY;  Surgeon: Kenney Peacemaker, MD;  Location: [facility];  Service: Gastroenterology;;   RE-EXPLORATION LUMBAR/  LAMINECTOMY AND MICRODISKECTOMY  10/14/2001   right L5  -- S1   RIGHT HEART CATH N/A 05/23/2022   Procedure: RIGHT HEART CATH;  Surgeon: Mardell Shade, MD;  [facility]: [facility];  Service: Cardiovascular;  Laterality: N/A;   RIGHT HEART CATH N/A 10/11/2023   Procedure: RIGHT HEART CATH;  Surgeon: Alwin Baars, DO;  Location: [facility];  Service: Cardiovascular;  Laterality: N/A;   RIGHT HEART CATH N/A 11/18/2023   Procedure: RIGHT HEART CATH;  Surgeon: Mardell Shade, MD;  [facility]: [facility];  Service: Cardiovascular;  Laterality: N/A;   RIGHT/LEFT HEART CATH AND CORONARY ANGIOGRAPHY N/A 07/18/2021   Procedure: RIGHT/LEFT HEART CATH AND CORONARY ANGIOGRAPHY;  Surgeon: Mardell Shade, MD;  Location: [facility];  Service: Cardiovascular;  Laterality: N/A;   TUBAL LIGATION Bilateral 10/17/1999   PPTL   UMBILICAL HERNIA REPAIR  age 48   Patient Active Problem List   Diagnosis Date Noted   History of CVA (cerebrovascular accident) 11/20/2023   Acute on chronic systolic (congestive) heart failure (HCC) 11/16/2023   SOB (shortness of breath) 10/08/2023   History of pulmonary embolism 10/08/2023   DM2 (diabetes mellitus, type 2) (HCC) 10/08/2023   Anemia of chronic disease 10/08/2023   Aortic atherosclerosis (HCC) 10/01/2023   Atherosclerosis of coronary artery 10/01/2023   Pulmonary emboli (HCC) 09/30/2023   HCAP (healthcare-associated pneumonia) 09/12/2023   Acute on chronic combined systolic and diastolic CHF (congestive heart failure) (HCC) 08/23/2023   Frequency of urination and polyuria 08/23/2023   Cerebrovascular accident (CVA) (HCC) 05/26/2023   Pyuria 05/25/2023   Left-sided weakness 05/24/2023   Epistaxis 04/22/2023   Benign neoplasm of cecum 03/26/2023   Benign neoplasm of descending colon 03/26/2023   Benign neoplasm of sigmoid colon 03/26/2023   Adhesive capsulitis of right shoulder 08/24/2022   Mass of right hand 02/22/2022   LBBB (left bundle branch block) 02/01/2022   Type 2  diabetes mellitus with hyperglycemia, without long-term current use of insulin  (HCC) 01/22/2022   HLD (hyperlipidemia) 01/21/2022   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 10/26/2021   Chronic combined systolic and diastolic heart failure (HCC) 07/27/2021   Acute combined systolic (congestive) and diastolic (congestive) heart failure (HCC)    Hypocalcemia 07/12/2021   Elevated troponin 07/12/2021   Lipoma of hand 07/20/2020   Osteoarthritis of carpometacarpal (CMC) joint of thumb 07/20/2020   Pain in right hand 07/20/2020   Radial styloid tenosynovitis 07/20/2020   Other osteoarthritis of spine 10/23/2019   Suspected COVID-19 virus infection 06/11/2019   Acute respiratory failure with hypoxia (HCC) 06/11/2019   Multifocal pneumonia 06/11/2019   Carpal tunnel syndrome, bilateral 04/24/2019   Ureteral stone with hydronephrosis 12/31/2016   Hx of adenomatous colonic polyps 08/03/2016   Normocytic anemia 07/10/2016   CAP (community acquired pneumonia) 07/06/2016   AKI (acute kidney injury) (HCC) 07/02/2016   Essential hypertension 07/26/2015   Eczema 07/26/2015   Overweight (BMI 25.0-29.9)  07/26/2015    PCP: Aldine Humphreys, PA  REFERRING PROVIDER: Haydee Lipa, MD   REFERRING DIAG:  J18.9 (ICD-10-CM) - Pneumonia of right lower lobe due to infectious organism  R53.1 (ICD-10-CM) - Weakness    THERAPY DIAG:  Other abnormalities of gait and mobility  Muscle weakness (generalized)  Unsteadiness on feet  Rationale for Evaluation and Treatment: Rehabilitation  ONSET DATE: 10/15/23  SUBJECTIVE:   SUBJECTIVE STATEMENT: 12/03/2023: states she is feeling good. Saw HH nursing this morning, sees them once a week. States breathing feeling pretty good today, does still get a Halabi SOB with exertional fatigue. No other new updates.   EVAL:  Patient reports to PT following several hospital stays with report of pulmonary embolism. Her last hospital admission  was on 10/15/23, including rehab for about 1 month. She states that she has been able to switch from the rollator to her cane since then. She was recently able to walk 20 minutes in the grocery store with the shopping cart, but states that it was completely exhausting.   PERTINENT HISTORY: Relevant PMHx includes Anemia, Arthritis, CHF, COPD, DM, HTN, Myocardial infarction, Pulmonary Emboli, CVA,   PAIN:  Are you having pain? No   PRECAUTIONS: Fall; Heart failure (does not tolerate reclined positions), RUE PICC line, pacemaker/defibrillator   RED FLAGS: None   WEIGHT BEARING RESTRICTIONS: No  FALLS:  Has patient fallen in last 6 months? No  LIVING ENVIRONMENT: Lives with: lives with their spouse Lives in: House/apartment Has following equipment at home: Single point cane and Environmental consultant - 4 wheeled   PLOF: Independent  PATIENT GOALS: "I want to be able to walk long distance (1 miles) at the park without getting as winded/exhausted. I want to be able to do cooking and cleaning without being exhausting."  NEXT MD VISIT: Not scheduled at time of eval   OBJECTIVE:  Note: Objective measures were completed at Evaluation unless otherwise noted.   PATIENT SURVEYS:  Patient-Specific Functional Scale  1. Walking long distance 4/10   2. Cooking a meal 4/10  3. Cleaning (household) 3/10  Total: 11/30 Average: 3.67  COGNITION: Overall cognitive status: Within functional limits for tasks assessed      UPPER EXTREMITY MMT:  MMT Right eval Left eval  Shoulder flexion 4- 4-  Shoulder extension    Shoulder abduction 3+ 4-  Shoulder adduction    Shoulder extension    Shoulder internal rotation 4 4  Shoulder external rotation 4 4  Middle trapezius    Lower trapezius    Elbow flexion 4+ 4+  Elbow extension 4+ 4+  Wrist flexion    Wrist extension    Wrist ulnar deviation    Wrist radial deviation    Wrist pronation    Wrist supination    Grip strength     (Blank rows = not  tested)   LOWER EXTREMITY MMT:  MMT Right eval Left eval  Hip flexion 4+ 4+  Hip extension    Hip abduction 4+ 4+  Hip adduction 4+ 4+  Hip internal rotation    Hip external rotation    Knee flexion 4+ 4+  Knee extension 4+ 4+  Ankle dorsiflexion    Ankle plantarflexion    Ankle inversion    Ankle eversion     (Blank rows = not tested)  FUNCTIONAL TESTS:  5 times sit to stand: 16 sec  2 minute walk test: 175 ft, stopped after 90 seconds to rest  Gait Speed: 0.61m/s = indicating risk  of future falls/hospitalization   GAIT: Distance walked: 175 feet Assistive device utilized: Single point cane Level of assistance: Complete Independence                                                                                                                                TREATMENT DATE:   [facility] Adult PT Treatment:                                                DATE: 12/03/2023 Pre session vitals: HR 83-95 difficulty with pulse ox  Monitoring during session: HR 70s-112 SpO2 96-100% (some inconsistency w/ HR on pulse ox, corroborated with radial palpation)  Therapeutic Exercise:  nu-step L3 56m LE only  Therapeutic Activity: STS 3x5, cues for pacing, posture, and breath control  Walking around clinic - 185' x3 Close monitoring of vitals throughout with education on how they relate to mobility, appropriate activity/exercise response, managing exertional fatigue, pacing, and activity modification    [facility] Adult PT Treatment  11/14/2023:    O2 sat: 96%+ w/ HR ranging from 78-110 during exercise - monitored throughout  Therapeutic Exercise: nu-step L3 86m while taking subjective and planning session with patient  LAQ - 2.5# - 2x10 Seated heel raises - 3x10 RTB HS HS curl - 2x10 ea Seated RTB abd - 2x10 Seated Hip adduction: 2x10 - 5''  Therapeutic Activity  Education regarding importance of monitoring weight at home and reporting any change of >2 lbs    HOME EXERCISE  PROGRAM: To be provided at f/u visit   ASSESSMENT:  CLINICAL IMPRESSION:  12/03/2023: Pt arrives w/o report of no new updates, pt reports continued improvement in SOB and activity tolerance with new medications.  Concentrated on building overall activity tolerance with walking and transfers.  Pt reports minimal SOB during visit and vitals were closely monitored during rest breaks.  Pt reports no adverse reaction following session.   EVAL:  Patient is a 63 y.o. female who was seen today for physical therapy evaluation and treatment for general weakness following several hospital admissions. She is demonstrating slow gait speed that is indicative of risk for future falls and hospitalization. She also has diminished BIL MMT scores throughout upper and lower extremities. She has related pain and difficulty with prolonged walking and performance of normal ADLs/IADLs. She requires skilled PT services at this time to address relevant deficits and improve overall function.     OBJECTIVE IMPAIRMENTS: decreased activity tolerance, decreased balance, decreased endurance, and decreased strength.   ACTIVITY LIMITATIONS: carrying, lifting, bending, standing, squatting, stairs, transfers, and locomotion level  PARTICIPATION LIMITATIONS: meal prep, cleaning, laundry, shopping, and community activity  PERSONAL FACTORS: Past/current experiences, Time since onset of injury/illness/exacerbation, and 3+ comorbidities: Relevant PMHx includes Anemia, Arthritis, CHF, COPD, DM, HTN, Myocardial infarction, Pulmonary Emboli, CVA are  also affecting patient's functional outcome.   REHAB POTENTIAL: Fair    CLINICAL DECISION MAKING: Evolving/moderate complexity  EVALUATION COMPLEXITY: Moderate   GOALS: Goals reviewed with patient? YES  SHORT TERM GOALS: Target date: 12/05/2023    Patient will be independent with initial home program at least 3 days/week.  Baseline: provided at eval Goal Status: INITIAL   2.    Patient will demonstrate improved walking tolerance to at least 225 ft in . Baseline: limited to walking <90 seconds (175 ft)  Goal status: INITIAL   LONG TERM GOALS: Target date: 01/03/2024    Patient will report improved overall functional ability with PSFS total score of 20/30 or greater.  Baseline: 11/30 Goal Status: INITIAL    2.  Patient will demonstrate ability to tolerate at least 15 minutes of walking prior to seated rest break.  Baseline: Unable to tolerate >2 minutes  Goal status: INITIAL  3.  Patient will demonstrate at least 4+/5 UE MMT scores. Baseline: see objective measures  Goal status: INITIAL  4.  Patient will demonstrate ability to perform 5x STS in less than 12 seconds.  Baseline: 16s Goal status: INITIAL  5.  Patient will demonstrate ability to perform at least 15 minutes of standing exercises with concurrent UE exercises in order to have improved tolerance of household cleaning.  Goal status: INITIAL    PLAN:  PT FREQUENCY: 1-2x/week  PT DURATION: 8 weeks  PLANNED INTERVENTIONS: 97164- PT Re-evaluation, 97750- Physical Performance Testing, 97110-Therapeutic exercises, 97530- Therapeutic activity, [id number]- Neuromuscular re-education, 97535- Self Care, [id number]- Manual therapy, [phone]- Gait training, [phone]- Aquatic Therapy, Patient/Family education, Balance training, and Stair training  [employer] Authorization   Choose one: Rehabilitative  Standardized Assessment or Functional Outcome Tool: See Pain Assessment and Other PFPS  Score or Percent Disability: 11/30  Body Parts Treated (Select each separately):  Lumbopelvic. Overall deficits/functional limitations for body part selected: moderate   If treatment provided at initial evaluation, no treatment charged due to lack of authorization.     PLAN FOR NEXT SESSION: continue with functional mobility, balance within tolerance. Monitoring vitals/tolerance closely. Mindful of PICC, pacemaker, and  recent HF admission.  Korah Hufstedler E Azarian Starace PT 12/03/2023 11:50 AM

## 2023-12-03 NOTE — Telephone Encounter (Signed)
 Called to confirm/remind patient of their appointment at the [facility] on 12/04/2023 9:00.   Appointment:   [x] Confirmed  [] Left mess   [] No answer/No voice mail  [] VM Full/unable to leave message  [] Phone not in service  Patient reminded to bring all medications and/or complete list.  Confirmed patient has transportation. Gave directions, instructed to utilize valet parking.

## 2023-12-04 ENCOUNTER — Encounter (HOSPITAL_COMMUNITY): Payer: Self-pay

## 2023-12-04 ENCOUNTER — Ambulatory Visit (HOSPITAL_COMMUNITY)
Admit: 2023-12-04 | Discharge: 2023-12-04 | Disposition: A | Discharge status: Home / Self Care | Source: Ambulatory Visit | Attending: Internal Medicine | Admitting: Internal Medicine

## 2023-12-04 VITALS — BP 102/74 | HR 112 | Ht 74.0 in | Wt 182.0 lb

## 2023-12-04 DIAGNOSIS — I5084 End stage heart failure: Secondary | ICD-10-CM | POA: Insufficient documentation

## 2023-12-04 DIAGNOSIS — Z8673 Personal history of transient ischemic attack (TIA), and cerebral infarction without residual deficits: Secondary | ICD-10-CM | POA: Diagnosis not present

## 2023-12-04 DIAGNOSIS — I10 Essential (primary) hypertension: Secondary | ICD-10-CM

## 2023-12-04 DIAGNOSIS — Z87891 Personal history of nicotine dependence: Secondary | ICD-10-CM | POA: Insufficient documentation

## 2023-12-04 DIAGNOSIS — I447 Left bundle-branch block, unspecified: Secondary | ICD-10-CM | POA: Insufficient documentation

## 2023-12-04 DIAGNOSIS — Z86711 Personal history of pulmonary embolism: Secondary | ICD-10-CM | POA: Diagnosis not present

## 2023-12-04 DIAGNOSIS — I428 Other cardiomyopathies: Secondary | ICD-10-CM | POA: Insufficient documentation

## 2023-12-04 DIAGNOSIS — I5022 Chronic systolic (congestive) heart failure: Secondary | ICD-10-CM

## 2023-12-04 DIAGNOSIS — I2510 Atherosclerotic heart disease of native coronary artery without angina pectoris: Secondary | ICD-10-CM | POA: Insufficient documentation

## 2023-12-04 DIAGNOSIS — I5042 Chronic combined systolic (congestive) and diastolic (congestive) heart failure: Secondary | ICD-10-CM | POA: Insufficient documentation

## 2023-12-04 DIAGNOSIS — Z7901 Long term (current) use of anticoagulants: Secondary | ICD-10-CM | POA: Insufficient documentation

## 2023-12-04 DIAGNOSIS — Z79899 Other long term (current) drug therapy: Secondary | ICD-10-CM | POA: Insufficient documentation

## 2023-12-04 DIAGNOSIS — I110 Hypertensive heart disease with heart failure: Secondary | ICD-10-CM | POA: Insufficient documentation

## 2023-12-04 DIAGNOSIS — I6300 Cerebral infarction due to thrombosis of unspecified precerebral artery: Secondary | ICD-10-CM

## 2023-12-04 DIAGNOSIS — Z905 Acquired absence of kidney: Secondary | ICD-10-CM

## 2023-12-04 MED ORDER — DIGOXIN 125 MCG PO TABS: 0.1250 mg | ORAL_TABLET | Freq: Every day | ORAL | 3 refills | Status: DC

## 2023-12-04 NOTE — Patient Instructions (Addendum)
 Good to see you today!  START Digoxin  0.125 mg tablet daily  Take an additional Torsemide  20 mg today and tomorrow  Labs done today, your results will be available in MyChart, we will contact you for abnormal readings.  Your physician recommends that you schedule a follow-up appointment 6 weeks( July ) Call office in mid June to schedule an appointment  If you have any questions or concerns before your next appointment please send us  a message through [place] or call our office at [phone].    TO LEAVE A MESSAGE FOR THE NURSE SELECT OPTION 2, PLEASE LEAVE A MESSAGE INCLUDING: YOUR NAME DATE OF BIRTH CALL BACK NUMBER REASON FOR CALL**this is important as we prioritize the call backs  YOU WILL RECEIVE A CALL BACK THE SAME DAY AS LONG AS YOU CALL BEFORE 4:00 PM At the [facility], you and your health needs are our priority. As part of our continuing mission to provide you with exceptional heart care, we have created designated Provider Care Teams. These Care Teams include your primary Cardiologist (physician) and Advanced Practice Providers (APPs- Physician Assistants and Nurse Practitioners) who all work together to provide you with the care you need, when you need it.   You may see any of the following providers on your designated Care Team at your next follow up: Dr Jules Oar Dr Peder Bourdon Dr. Alwin Baars Dr. Arta Lark Amy Marijane Shoulders, NP Ruddy Corral, [place] [facility] [place], [place] Dennise Fitz, NP [place] Lee, NP Shawnee Dellen, NP Luster Salters, PharmD Bevely Brush, PharmD   Please be sure to bring in all your medications bottles to every appointment.    Thank you for choosing [facility]

## 2023-12-05 ENCOUNTER — Other Ambulatory Visit (HOSPITAL_COMMUNITY): Payer: Self-pay

## 2023-12-05 ENCOUNTER — Ambulatory Visit

## 2023-12-05 DIAGNOSIS — R2689 Other abnormalities of gait and mobility: Secondary | ICD-10-CM | POA: Diagnosis not present

## 2023-12-05 DIAGNOSIS — R2681 Unsteadiness on feet: Secondary | ICD-10-CM

## 2023-12-05 DIAGNOSIS — M6281 Muscle weakness (generalized): Secondary | ICD-10-CM

## 2023-12-05 NOTE — Therapy (Signed)
 OUTPATIENT PHYSICAL THERAPY TREATMENT   Patient Name: Lynn Estrada MRN: [id number] DOB:1961/06/26, 63 y.o., female Today's Date: 12/05/2023  END OF SESSION:    PT End of Session - 12/05/23 1054     Visit Number 6    Number of Visits 9    Date for PT Re-Evaluation 01/03/24    Authorization Type [place] MEDICAID [facility]    Authorization Time Period Approved 12 visits 11/07/23-12/20/23    PT Start Time 1051    PT Stop Time 1129    PT Time Calculation (min) 38 min    Activity Tolerance Patient limited by fatigue    Behavior During Therapy [facility] for tasks assessed/performed              Past Medical History:  Diagnosis Date   Anemia    Arthritis    CHF (congestive heart failure) (HCC) 12/06/2022   EF is 20% to 25 %   COPD (chronic obstructive pulmonary disease) (HCC)    Diabetes mellitus without complication (HCC)    Eczema    GERD (gastroesophageal reflux disease)    History of adenomatous polyp of colon    08-03-2016  tubular adenoma x2 and hyperplastic   History of chronic gastritis 08/03/2016   History of ectopic pregnancy 1988   s/p  left salpingectomy   History of endometriosis    History of kidney stones    History of partial nephrectomy    right pelvis for very large stone   History of pneumonia 07/02/2016   CAP   History of sepsis    07-01-2016  sepsis w/ pyelonephritis, CAP /   12-31-2016  urosepsis w/ kidney stone obstruction   History of uterine leiomyoma    Hx of adenomatous colonic polyps 08/03/2016   2 adenomas, 1 hpp and 1 lost polyp 2018 all < 1 cm    Hyperlipidemia    Hypertension    Left ureteral stone    Myocardial infarction [facility])    Nephrolithiasis    left obstructive stone and right non-obstructive stone  per CT 12-31-2016   Urgency of urination    Past Surgical History:  Procedure Laterality Date   ABDOMINAL HYSTERECTOMY  01/20/2007   w/ Lysis Adhesions/  Left salpingoophorectomy/  Right Salpingectomy   BIV ICD INSERTION CRT-D N/A  01/14/2023   Procedure: BIV ICD INSERTION CRT-D;  Surgeon: Efraim Grange, MD;  Location: [facility];  Service: Cardiovascular;  Laterality: N/A;   CHOLECYSTECTOMY  1994   and Right Partial Nephrectomy (pelvis for very large stone)   COLONOSCOPY WITH PROPOFOL  N/A 03/26/2023   Procedure: COLONOSCOPY WITH PROPOFOL ;  Surgeon: Kenney Peacemaker, MD;  Location: [facility];  Service: Gastroenterology;  Laterality: N/A;   CYSTOSCOPY W/ URETERAL STENT PLACEMENT Left 12/31/2016   Procedure: CYSTOSCOPY WITH RETROGRADE PYELOGRAM/ LEFT URETERAL STENT PLACEMENT;  Surgeon: Osborn Blaze, MD;  Location: [facility];  Service: Urology;  Laterality: Left;   CYSTOSCOPY WITH RETROGRADE PYELOGRAM, URETEROSCOPY AND STENT PLACEMENT Left 01/18/2017   Procedure: 1ST STAGE CYSTOSCOPY WITH RETROGRADE PYELOGRAM, URETEROSCOPY AND STENT REPLACEMENT;  Surgeon: Osborn Blaze, MD;  Location: [facility];  Service: Urology;  Laterality: Left;   CYSTOSCOPY WITH RETROGRADE PYELOGRAM, URETEROSCOPY AND STENT PLACEMENT Left 02/01/2017   Procedure: 2ND STAGE CYSTOSCOPY WITH RETROGRADE PYELOGRAM, URETEROSCOPY AND STENT REPLACEMENT;  Surgeon: Osborn Blaze, MD;  Location: [facility];  Service: Urology;  Laterality: Left;   ECTOPIC PREGNANCY SURGERY  1988   Left Salpingectomy   HOLMIUM LASER APPLICATION  Left 01/18/2017   Procedure: HOLMIUM LASER APPLICATION;  Surgeon: Osborn Blaze, MD;  Location: [facility];  Service: Urology;  Laterality: Left;   HOLMIUM LASER APPLICATION Left 02/01/2017   Procedure: HOLMIUM LASER APPLICATION;  Surgeon: Osborn Blaze, MD;  Location: [facility];  Service: Urology;  Laterality: Left;   LUMBAR DISC SURGERY  01/1999   right L5 -- S1   POLYPECTOMY  03/26/2023   Procedure: POLYPECTOMY;  Surgeon: Kenney Peacemaker, MD;  Location: [facility];  Service: Gastroenterology;;   RE-EXPLORATION LUMBAR/  LAMINECTOMY AND MICRODISKECTOMY  10/14/2001    right L5 -- S1   RIGHT HEART CATH N/A 05/23/2022   Procedure: RIGHT HEART CATH;  Surgeon: Mardell Shade, MD;  Location: [facility];  Service: Cardiovascular;  Laterality: N/A;   RIGHT HEART CATH N/A 10/11/2023   Procedure: RIGHT HEART CATH;  Surgeon: Alwin Baars, DO;  Location: [facility];  Service: Cardiovascular;  Laterality: N/A;   RIGHT HEART CATH N/A 11/18/2023   Procedure: RIGHT HEART CATH;  Surgeon: Mardell Shade, MD;  Location: [facility];  Service: Cardiovascular;  Laterality: N/A;   RIGHT/LEFT HEART CATH AND CORONARY ANGIOGRAPHY N/A 07/18/2021   Procedure: RIGHT/LEFT HEART CATH AND CORONARY ANGIOGRAPHY;  Surgeon: Mardell Shade, MD;  Location: [facility];  Service: Cardiovascular;  Laterality: N/A;   TUBAL LIGATION Bilateral 10/17/1999   PPTL   UMBILICAL HERNIA REPAIR  age 3   Patient Active Problem List   Diagnosis Date Noted   History of CVA (cerebrovascular accident) 11/20/2023   Acute on chronic systolic (congestive) heart failure (HCC) 11/16/2023   SOB (shortness of breath) 10/08/2023   History of pulmonary embolism 10/08/2023   DM2 (diabetes mellitus, type 2) (HCC) 10/08/2023   Anemia of chronic disease 10/08/2023   Aortic atherosclerosis (HCC) 10/01/2023   Atherosclerosis of coronary artery 10/01/2023   Pulmonary emboli (HCC) 09/30/2023   HCAP (healthcare-associated pneumonia) 09/12/2023   Acute on chronic combined systolic and diastolic CHF (congestive heart failure) (HCC) 08/23/2023   Frequency of urination and polyuria 08/23/2023   Cerebrovascular accident (CVA) (HCC) 05/26/2023   Pyuria 05/25/2023   Left-sided weakness 05/24/2023   Epistaxis 04/22/2023   Benign neoplasm of cecum 03/26/2023   Benign neoplasm of descending colon 03/26/2023   Benign neoplasm of sigmoid colon 03/26/2023   Adhesive capsulitis of right shoulder 08/24/2022   Mass of right hand 02/22/2022   LBBB (left bundle branch block)  02/01/2022   Type 2 diabetes mellitus with hyperglycemia, without long-term current use of insulin  (HCC) 01/22/2022   HLD (hyperlipidemia) 01/21/2022   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 10/26/2021   Chronic combined systolic and diastolic heart failure (HCC) 07/27/2021   Acute combined systolic (congestive) and diastolic (congestive) heart failure (HCC)    Hypocalcemia 07/12/2021   Elevated troponin 07/12/2021   Lipoma of hand 07/20/2020   Osteoarthritis of carpometacarpal (CMC) joint of thumb 07/20/2020   Pain in right hand 07/20/2020   Radial styloid tenosynovitis 07/20/2020   Other osteoarthritis of spine 10/23/2019   Suspected COVID-19 virus infection 06/11/2019   Acute respiratory failure with hypoxia (HCC) 06/11/2019   Multifocal pneumonia 06/11/2019   Carpal tunnel syndrome, bilateral 04/24/2019   Ureteral stone with hydronephrosis 12/31/2016   Hx of adenomatous colonic polyps 08/03/2016   Normocytic anemia 07/10/2016   CAP (community acquired pneumonia) 07/06/2016   AKI (acute kidney injury) (HCC) 07/02/2016   Essential hypertension 07/26/2015   Eczema 07/26/2015  Overweight (BMI 25.0-29.9) 07/26/2015    PCP: Aldine Humphreys, PA  REFERRING PROVIDER: Haydee Lipa, MD   REFERRING DIAG:  J18.9 (ICD-10-CM) - Pneumonia of right lower lobe due to infectious organism  R53.1 (ICD-10-CM) - Weakness    THERAPY DIAG:  Other abnormalities of gait and mobility  Muscle weakness (generalized)  Unsteadiness on feet  Rationale for Evaluation and Treatment: Rehabilitation  ONSET DATE: 10/15/23  SUBJECTIVE:   SUBJECTIVE STATEMENT: 12/05/2023: Patient states that she spent her morning doing household tasks. Her HH aide checked her vitals this morning before leaving for PT and they were good. At end of today's visit she states that she would like to hold on scheduling additional appointments. She intends to continue working on exercises at home.     EVAL:  Patient reports to PT following several hospital stays with report of pulmonary embolism. Her last hospital admission was on 10/15/23, including rehab for about 1 month. She states that she has been able to switch from the rollator to her cane since then. She was recently able to walk 20 minutes in the grocery store with the shopping cart, but states that it was completely exhausting.   PERTINENT HISTORY: Relevant PMHx includes Anemia, Arthritis, CHF, COPD, DM, HTN, Myocardial infarction, Pulmonary Emboli, CVA,   PAIN:  Are you having pain? No   PRECAUTIONS: Fall; Heart failure (does not tolerate reclined positions), RUE PICC line, pacemaker/defibrillator   RED FLAGS: None   WEIGHT BEARING RESTRICTIONS: No  FALLS:  Has patient fallen in last 6 months? No  LIVING ENVIRONMENT: Lives with: lives with their spouse Lives in: House/apartment Has following equipment at home: Single point cane and Environmental consultant - 4 wheeled   PLOF: Independent  PATIENT GOALS: "I want to be able to walk long distance (1 miles) at the park without getting as winded/exhausted. I want to be able to do cooking and cleaning without being exhausting."  NEXT MD VISIT: Not scheduled at time of eval   OBJECTIVE:  Note: Objective measures were completed at Evaluation unless otherwise noted.   PATIENT SURVEYS:  Patient-Specific Functional Scale  1. Walking long distance 4/10   2. Cooking a meal 4/10  3. Cleaning (household) 3/10  Total: 11/30 Average: 3.67  COGNITION: Overall cognitive status: Within functional limits for tasks assessed      UPPER EXTREMITY MMT:  MMT Right eval Left eval  Shoulder flexion 4- 4-  Shoulder extension    Shoulder abduction 3+ 4-  Shoulder adduction    Shoulder extension    Shoulder internal rotation 4 4  Shoulder external rotation 4 4  Middle trapezius    Lower trapezius    Elbow flexion 4+ 4+  Elbow extension 4+ 4+  Wrist flexion    Wrist extension     Wrist ulnar deviation    Wrist radial deviation    Wrist pronation    Wrist supination    Grip strength     (Blank rows = not tested)   LOWER EXTREMITY MMT:  MMT Right eval Left eval  Hip flexion 4+ 4+  Hip extension    Hip abduction 4+ 4+  Hip adduction 4+ 4+  Hip internal rotation    Hip external rotation    Knee flexion 4+ 4+  Knee extension 4+ 4+  Ankle dorsiflexion    Ankle plantarflexion    Ankle inversion    Ankle eversion     (Blank rows = not tested)  FUNCTIONAL TESTS:  5 times sit to stand:  16 sec  2 minute walk test: 175 ft, stopped after 90 seconds to rest  Gait Speed: 0.59m/s = indicating risk of future falls/hospitalization   GAIT: Distance walked: 175 feet Assistive device utilized: Single point cane Level of assistance: Complete Independence                                                                                                                                TREATMENT DATE:    [facility] Adult PT Treatment:                                                DATE: 12/05/2023  Pre session vitals: HR 104, O2 97 difficulty with pulse ox  Monitoring during session: HR 70s-112 SpO2 96-100% (some inconsistency w/ HR on pulse ox, corroborated with radial palpation)  Therapeutic Exercise:  Nu-step L5 68m LE only  Therapeutic Activity: STS 2x8, cues for pacing, posture, and breath control  Walking around clinic (each lap 185 ft) Round 1: 370 ft  Round 2: 3 laps   Close monitoring of vitals throughout with education on how they relate to mobility, appropriate activity/exercise response, managing exertional fatigue, pacing, and activity modification   [facility] Adult PT Treatment:                                                DATE: 12/03/2023 Pre session vitals: HR 83-95 difficulty with pulse ox  Monitoring during session: HR 70s-112 SpO2 96-100% (some inconsistency w/ HR on pulse ox, corroborated with radial palpation)  Therapeutic Exercise:  nu-step L3 59m  LE only  Therapeutic Activity: STS 3x5, cues for pacing, posture, and breath control  Walking around clinic - 185' x3 Close monitoring of vitals throughout with education on how they relate to mobility, appropriate activity/exercise response, managing exertional fatigue, pacing, and activity modification    [facility] Adult PT Treatment  11/14/2023:    O2 sat: 96%+ w/ HR ranging from 78-110 during exercise - monitored throughout  Therapeutic Exercise: nu-step L3 18m while taking subjective and planning session with patient  LAQ - 2.5# - 2x10 Seated heel raises - 3x10 RTB HS HS curl - 2x10 ea Seated RTB abd - 2x10 Seated Hip adduction: 2x10 - 5''  Therapeutic Activity  Education regarding importance of monitoring weight at home and reporting any change of >2 lbs    HOME EXERCISE PROGRAM: To be provided at f/u visit   ASSESSMENT:  CLINICAL IMPRESSION:  12/05/2023: Lynn Estrada had good tolerance of today's treatment session. She was able to walk for , followed by bout prior to requiring rest break. Patient is encouraged to schedule additional visits prior to appts on 6/20.  She would like to wait. Her HR remains 107-114bpm. We will continue to progress per POC as tolerated, in order to reach established rehab goals.    EVAL:  Patient is a 63 y.o. female who was seen today for physical therapy evaluation and treatment for general weakness following several hospital admissions. She is demonstrating slow gait speed that is indicative of risk for future falls and hospitalization. She also has diminished BIL MMT scores throughout upper and lower extremities. She has related pain and difficulty with prolonged walking and performance of normal ADLs/IADLs. She requires skilled PT services at this time to address relevant deficits and improve overall function.     OBJECTIVE IMPAIRMENTS: decreased activity tolerance, decreased balance, decreased endurance, and decreased strength.   ACTIVITY  LIMITATIONS: carrying, lifting, bending, standing, squatting, stairs, transfers, and locomotion level  PARTICIPATION LIMITATIONS: meal prep, cleaning, laundry, shopping, and community activity  PERSONAL FACTORS: Past/current experiences, Time since onset of injury/illness/exacerbation, and 3+ comorbidities: Relevant PMHx includes Anemia, Arthritis, CHF, COPD, DM, HTN, Myocardial infarction, Pulmonary Emboli, CVA are also affecting patient's functional outcome.   REHAB POTENTIAL: Fair    CLINICAL DECISION MAKING: Evolving/moderate complexity  EVALUATION COMPLEXITY: Moderate   GOALS: Goals reviewed with patient? YES  SHORT TERM GOALS: Target date: 12/05/2023   Patient will be independent with initial home program at least 3 days/week.  Baseline: provided at eval Goal Status: INITIAL   2.   Patient will demonstrate improved walking tolerance to at least 225 ft in . Baseline: limited to walking <90 seconds (175 ft)  Goal status: INITIAL   LONG TERM GOALS: Target date: 01/03/2024    Patient will report improved overall functional ability with PSFS total score of 20/30 or greater.  Baseline: 11/30 Goal Status: INITIAL    2.  Patient will demonstrate ability to tolerate at least 15 minutes of walking prior to seated rest break.  Baseline: Unable to tolerate >2 minutes  Goal status: INITIAL  3.  Patient will demonstrate at least 4+/5 UE MMT scores. Baseline: see objective measures  Goal status: INITIAL  4.  Patient will demonstrate ability to perform 5x STS in less than 12 seconds.  Baseline: 16s Goal status: INITIAL  5.  Patient will demonstrate ability to perform at least 15 minutes of standing exercises with concurrent UE exercises in order to have improved tolerance of household cleaning.  Goal status: INITIAL    PLAN:  PT FREQUENCY: 1-2x/week  PT DURATION: 8 weeks  PLANNED INTERVENTIONS: [id number]- PT Re-evaluation, 97750- Physical Performance Testing,  97110-Therapeutic exercises, 97530- Therapeutic activity, [id number]- Neuromuscular re-education, 97535- Self Care, [id number]- Manual therapy, [phone]- Gait training, [phone]- Aquatic Therapy, Patient/Family education, Balance training, and Stair training  [employer] Authorization   Choose one: Rehabilitative  Standardized Assessment or Functional Outcome Tool: See Pain Assessment and Other PFPS  Score or Percent Disability: 11/30  Body Parts Treated (Select each separately):  Lumbopelvic. Overall deficits/functional limitations for body part selected: moderate   If treatment provided at initial evaluation, no treatment charged due to lack of authorization.     PLAN FOR NEXT SESSION: continue with functional mobility, balance within tolerance. Monitoring vitals/tolerance closely. Mindful of PICC, pacemaker, and recent HF admission.  Arlester Bence, PT, DPT  12/05/2023 12:52 PM

## 2023-12-13 ENCOUNTER — Other Ambulatory Visit (HOSPITAL_COMMUNITY): Payer: Self-pay | Admitting: Internal Medicine

## 2023-12-18 NOTE — H&P (View-Only) (Signed)
 [facility] Consult   Date of Service: 12/18/2023    Time: 4:14 PM   Referring Provider:  Dr. Toribio Proper   Primary Care Physician:  Self   Chief Complaint: SOB/fatigue  History of Present Illness: Lynn Estrada is a 63 y.o. female former smoker with HFrEF who presents for heart transplant eval. She has a pmhx chronic systolic HF EF 20% s/p ICD 2023 (no shocks), pHTN, T2DM (A1c 6), hx CVA (05/2023 distal ACA occlusion, no deficits currently) and PE (08/2023) on Eliquis , HL, hx CAP, hx recurrent renal calculi and s/p partial R nephrectomy..    She has required several hospitalizations over the last few months; PNA, UTI, and acute on chronic systolic HF  At this point, she is being referred for consideration of advanced HF therapy and transplant. Initially diagnosed with HF in 2023 after multiple hospital visits for respiratory symptoms. Recently has had admissions for PE in march and again in may for HF exacerbation when she was started on home IV milrinone . Since her hospitalization she has rehabbed well and is active and feeling well on the inotropes.   At present she reports  no symptoms.  Patient denies any chest pain. Patient denies dyspnea, fatigue, near-syncope, syncope, and palpitations.    Coronary artery disease risk factors include: diabetes mellitus, dyslipidemia, hypertension, and smoking/ tobacco exposure. Patient's current NYHA Functional Heart Failure Classification is: IV.   Past Medical History:  Past Medical History:  Diagnosis Date  . CHF (congestive heart failure) (CMS/HHS-HCC)   . Chronic systolic congestive heart failure (CMS/HHS-HCC) 10/26/2023  . HTN (hypertension)   . Hx of completed stroke   . Hx of renal calculi   . Hx pulmonary embolism   . Hyperlipidemia   . Type 2 diabetes mellitus (CMS/HHS-HCC)      Past Surgical History:  Past Surgical History:  Procedure Laterality Date  . hx abdominal hysterectomy    . hx lumbar disc  surgery    . hx partial R nephrectomy     in setting of obstrctive calculi  . INSERTION ICD GENERATOR W/EXISTING LEADS    . tubal ligation    . UMBILICAL HERNIA REPAIR      Family History:   No family history on file.   Social History:  Social History   Socioeconomic History  . Marital status: Married  Tobacco Use  . Smoking status: Unknown   Social Drivers of Corporate investment banker Strain: Low Risk  (10/29/2023)   Overall Financial Resource Strain (CARDIA)   . Difficulty of Paying Living Expenses: Not hard at all  Food Insecurity: Patient Declined (11/16/2023)   [facility] from [facility]   Hunger Vital Sign   . Within the past 12 months, you worried that your food would run out before you got the money to buy more.: Patient declined   . Within the past 12 months, the food you bought just didn't last and you didn't have money to get more.: Patient declined  Transportation Needs: Patient Declined (11/16/2023)   Received from [facility] - Transportation   . Lack of Transportation (Medical): Patient declined   . Lack of Transportation (Non-Medical): Patient declined     Allergies  Allergen Reactions  . Bactrim [Sulfamethoxazole -Trimethoprim] Itching and Rash  . Carvedilol  Itching  . Latex, Natural Rubber Rash  . Metoprolol  Itching     Medications: Prior to Admission medications  Medication Sig Taking? Last Dose  apixaban  (ELIQUIS ) 5 mg tablet  Take 1 tablet (5 mg total) by mouth every 12 (twelve) hours Yes Taking  atorvastatin  (LIPITOR) 10 MG tablet Take 1 tablet (10 mg total) by mouth once daily Yes Taking  benzonatate  (TESSALON ) 200 MG capsule Take 200 mg by mouth 3 (three) times daily as needed for Cough Yes PRN Not Currently Taking  calcium  carbonate (TUMS ORAL) Take 1 tablet by mouth once daily as needed Yes PRN Not Currently Taking  empagliflozin  (JARDIANCE ) 10 mg tablet Take 1 tablet (10 mg total) by mouth once daily Yes Taking  metFORMIN   (GLUCOPHAGE ) 500 MG tablet Take 1 tablet (500 mg total) by mouth 2 (two) times daily with meals Yes Taking  midodrine  (PROAMATINE ) 5 MG tablet Take 1 tablet (5 mg total) by mouth 3 (three) times daily Yes Taking  mv-min/iron /folic/calcium /vitK (WOMEN'S MULTIVITAMIN ORAL) Take 1 tablet by mouth once daily Yes Taking  ondansetron  (ZOFRAN ) 4 MG tablet Take 1 tablet (4 mg total) by mouth every 8 (eight) hours as needed for Nausea Yes PRN Not Currently Taking  TORsemide  (DEMADEX ) 20 MG tablet Take 20 mg by mouth once daily Yes Taking  clopidogreL  (PLAVIX ) 75 mg tablet Take 1 tablet (75 mg total) by mouth once daily Patient not taking: Reported on 01/01/2024  Unknown      Review of Systems:  Pertinent items in the HPI   STS Review of Systems:  Review of Systems (STS data):    Neuro:  Cerebrovascular Disease: Yes: Stroke > 30 days ago Carotid stenosis:  None  Cardiovascular:  History of Cardiac Surgery: No Family Hx Premature CAD: No Hypertension: Yes Hyperlipidemia: No Syncope: No Pre-Syncope: Yes  Respiratory:  Chronic Lung Disease: mild Home Oxygen: No Sleep apnea: No  GI:  Liver Disease:No History of GIB: No  Renal:  Renal Insufficiency: No Dialysis: No  Endocrine:  Immunocompromised: No Diabetes: Yes. Treatment: Oral medication  Heme:  History of DVT: Yes History of PE: Yes   Extremities:  Peripheral Artery Disease: No History of varicose veins: No Claudication symptoms: No   Other:  Cancer within 5 years: No Mediastinal Radiation: Yes    Physical Exam: Vitals:   01/01/24 1226  BP: 120/85  Pulse: 93  Resp: 18    There is no height or weight on file to calculate BSA.  Ht:  Wt:  [name] is no height or weight on file to calculate BSA. There is no height or weight on file to calculate BMI.   General appearance: alert and cooperative Neurologic: Alert and oriented X 3, normal strength and tone. HEENT: Head: Normocephalic, atraumatic, without obvious  abnormality. Neck: no JVD and supple, symmetrical, trachea midline Lungs: clear to auscultation bilaterally and normal chest wall Heart: regular rate and rhythm Abdomen: soft, non-tender; bowel sounds normal; no masses Extremities: extremities normal, atraumatic, no cyanosis or edema, no varicose veins or vein stripping scars, capillary refill < 2 seconds    Data: Labs: Lab Results  Component Value Date   WBC 5.3 10/28/2023   HGB 9.8 (L) 10/28/2023   HCT 32.5 (L) 10/28/2023   MCV 91 10/28/2023   PLT 162 10/28/2023   Lab Results  Component Value Date   CREATININE 1.4 (H) 10/28/2023   BUN 25 (H) 10/28/2023   NA 139 10/28/2023   K 4.0 10/28/2023   CL 104 10/28/2023   CO2 26 10/28/2023   Lab Results  Component Value Date   ALT 20 10/24/2023   AST 19 10/24/2023   ALKPHOS 49 10/24/2023   Lab Results  Component Value Date   INR 1.1 10/24/2023   Lab Results  Component Value Date   HGBA1C 5.4 11/27/2023   HGBA1C 6 (!) 09/04/2023        Chest CT: 10/25/23 1. The known right lower lobe pulmonary emboli have diminished over  the last week, with mild residual thrombus in the  segmental/subsegmental branches. Overall improved thromboembolic  burden from prior exam. No new or progressive pulmonary emboli.  2. Progressive ground-glass and consolidative opacity in the right  lower lobe, favor worsening pneumonia.  3. Diffuse heterogeneous pulmonary parenchyma with areas of smooth  septal thickening, query pulmonary edema. Trace right pleural  effusion, increased.  4. Cardiomegaly.   CXR: Enlarged mediastinal contour with bibasilar heterogeneous opacities possibly representing atelectasis and edema and/or infection.    The preliminary report (critical or emergent communication) was reviewed prior to this dictation and there are no critical differences between the preliminary results and the impressions in this final report.  PFTs; 10/14/2023  FVC-Pre L 2.82   FVC-%Pred-Pre % 61  FVC-Post L 2.8  FVC-%Pred-Post % 61  FVC-%Change-Post % 0  FEV1-Pre L 2.35  FEV1-%Pred-Pre % 66  FEV1-Post L 2.45  FEV1-%Pred-Post % 68  FEV1-%Change-Post % 4  FEV6-Pre L 2.82  FEV6-%Pred-Pre % 63  FEV6-Post L 2.8  FEV6-%Pred-Post % 63  FEV6-%Change-Post % 0  Pre FEV1/FVC ratio % 83  FEV1FVC-%Pred-Pre % 107  Post FEV1/FVC ratio % 87  FEV1FVC-%Change-Post % 4  Pre FEV6/FVC Ratio % 100  FEV6FVC-%Pred-Pre % 103  Post FEV6/FVC ratio % 100  FEV6FVC-%Pred-Post % 103  FEF 25-75 Pre L/sec 2.51  FEF2575-%Pred-Pre % 85  FEF 25-75 Post L/sec 3.28  FEF2575-%Pred-Post % 112  FEF2575-%Change-Post % 30  RV L 1.55  RV % pred % 61  TLC L 4.51  TLC % pred % 68  DLCO unc ml/min/mmHg 14.91  DLCO unc % pred % 54  DLCO cor ml/min/mmHg 16.19  DLCO cor % pred % 58  DL/VA ml/min/mmHg/L 6.23  DL/VA % pred % 95     Carotid US : Left:   ICA: Mild calcified plaque was visualized at the proximal ICA,        compatible with a less than 50% stenosis by velocity criteria. Marked        tortuosity was visualized at the distal ICA.     CCA: No significant plaque was visualized throughout.     ECA: No significant plaque was visualized throughout.          Vertebral A: Antegrade flow was demonstrated, with no evidence of        significant stenosis.          Subclavian A: No significant stenosis by velocity criteria. Biphasic        waveforms were demonstrated.  ABIs;  Lab study indicated no significant arterial occlusive disease at rest   bilaterally.   Abd US :    CT abd/pel noncon 4/25 at [facility] Limited assessment given the lack of IV contrast, particularly of the solid organs, bowel, and vasculature.   - Lower Thorax: Cardiomegaly with bibasilar atelectasis. No pleural or pericardial effusions.   - Liver: Unremarkable noncontrast appearance.    - Biliary and Gallbladder: No intrahepatic or extrahepatic bile  duct dilatation.   - Spleen: Normal in size.     - Pancreas: Unremarkable noncontrast appearance.    - Adrenal Glands: Normal.    - Kidneys: Nephrolithiasis and papillary tip calcifications, bilaterally. No hydronephrosis.   - Abdominal and Pelvic  Vasculature: No abdominal aortic aneurysm.   - Gastrointestinal Tract: No evidence of bowel obstruction.   - Peritoneum/Mesentery/Retroperitoneum: No free fluid.  No free intraperitoneal air.   - Lymph Nodes: No retroperitoneal, mesenteric, or pelvic lymphadenopathy.     - Bladder: Normal in appearance.   - Pelvic Organs: Unremarkable.   - Body Wall: Unremarkable.   - Musculoskeletal:  No aggressive appearing osseous lesions.     Impression: 1.  No etiology for nausea and vomiting identified. Specifically, no bowel obstruction. 2.  Nephrolithiasis, bilaterally. No hydronephrosis.  Echo: 11/16/2023: (OSH) LVEF <20% LIVDd 7.7 LVIDs 7.1  IMPRESSIONS     1. Left ventricular ejection fraction, by estimation, is <20%. Left ventricular ejection fraction by 3D volume is 23 %. The left ventricle has severely decreased function. The left ventricle demonstrates global hypokinesis. The left ventricular internal   cavity size was severely dilated. Left ventricular diastolic parameters are indeterminate.   2. Right ventricular systolic function is mildly reduced. The right ventricular size is normal.   3. Left atrial size was severely dilated.   4. Right atrial size was mildly dilated.   5. A small pericardial effusion is present. The pericardial effusion is posterior to the left ventricle. There is no evidence of cardiac tamponade.   6. The mitral valve is normal in structure. Mild mitral valve regurgitation. No evidence of mitral stenosis.   7. The aortic valve is normal in structure. Aortic valve regurgitation is trivial. No aortic stenosis is present.   RHC 11/18/2023 (OSH)  Findings:   RA = 13  RV = 62/16  PA = 59/34 (48)  PCW  = 33  Fick cardiac output/index = 4.0/1.9  Thermo CO/CI = 3.8/1.8  PVR = 3.75 (Fick) 4.0 (TD)  Ao sat = 97%  PA sat = 45%, 45%  PAPi = 1.9         Assessment / Plan:  Ms. Mattice is a 63 yo female with chronic systolic HF class IV sxs now being referred for heart transplant eval. On evaluation today patient appears to be a reasonable candidate for heart transplantation. 6'2, B+ blood type, on home IV milrinone  (doing well on home inotrope), no prior chest surgeries.   Will discuss at transplant listing conference     CTS Quality Measures:  CAD: Not Indicated. HF: EF 20%: ACE/ARB: Not Indicated. DVT ppx: Not Indicated.  Navin Vigneshwar MD (fellow) Cardiothoracic Surgery    Patient seen and examined. The above plan was formulated by me.   Lynn Estrada is a very pleasant 63 yo with end stage heart failure (class 4) and is now inotrope-dependent. We spent some time discussion the nature/risks/benefits of heart transplantation. I believe she is a reasonable candidate for heart transplantation.   We will discuss him in multidisciplinary listing conference next week and inform her of our recommendations.   Attestation Statement:   I personally saw and evaluated the patient, and participated in the management and treatment plan as documented in the resident/fellow note.  Jacob Niall Schroder, MD  .

## 2023-12-23 ENCOUNTER — Other Ambulatory Visit (HOSPITAL_COMMUNITY): Payer: Self-pay

## 2024-01-01 ENCOUNTER — Other Ambulatory Visit (HOSPITAL_COMMUNITY): Payer: Self-pay | Admitting: Internal Medicine

## 2024-01-06 ENCOUNTER — Encounter: Payer: Self-pay | Admitting: Cardiovascular Disease

## 2024-01-06 ENCOUNTER — Other Ambulatory Visit (HOSPITAL_COMMUNITY): Payer: Self-pay | Admitting: Internal Medicine

## 2024-01-08 ENCOUNTER — Telehealth: Payer: Self-pay | Admitting: Internal Medicine

## 2024-01-08 NOTE — Telephone Encounter (Signed)
 Inbound call from [place] from [facility] stated they would like to be faxed patients colonoscopy pathology results from 03/26/2023 Fax number [phone]  Please advise  Thank you

## 2024-01-08 NOTE — Telephone Encounter (Signed)
 Pathology results faxed at this time.

## 2024-01-09 ENCOUNTER — Telehealth: Payer: Self-pay | Admitting: *Deleted

## 2024-01-09 ENCOUNTER — Other Ambulatory Visit (HOSPITAL_COMMUNITY): Payer: Self-pay | Admitting: Internal Medicine

## 2024-01-09 NOTE — Telephone Encounter (Signed)
 Contacted TOC Director to follow up on  Milrinone  LOG is about to expire, and discuss coverage going forward. Patient is being followed by [facility] Transplant Team. Contacted Transplant Coordinator, [place] to discuss ongoing coverage for Home Milrinone .  [phone]  Dr Tobie Baptise office [phone] [place] # [phone]

## 2024-01-10 ENCOUNTER — Telehealth: Payer: Self-pay | Admitting: *Deleted

## 2024-01-10 NOTE — Telephone Encounter (Signed)
 TOC CM received call from [facility] rep, Pam RN, stating patient Home Milrinone  dose will end Monday. Home Milrinone  was covered under TOC LOG for 30 days. Contacted Dr Tobie office, spoke to [facility] she will discuss with physician about continuation of medication. Notified TOC supervisor, with updated. Waiting call back for Dr Tobie. Delcia Crick RN3 CCM, Heart Failure TOC CM [phone]

## 2024-01-14 ENCOUNTER — Ambulatory Visit (INDEPENDENT_AMBULATORY_CARE_PROVIDER_SITE_OTHER): Payer: Medicare (Managed Care)

## 2024-01-14 DIAGNOSIS — I447 Left bundle-branch block, unspecified: Secondary | ICD-10-CM

## 2024-01-15 LAB — CUP PACEART REMOTE DEVICE CHECK
Battery Remaining Longevity: 50 mo
Battery Remaining Percentage: 77 %
Battery Voltage: 2.96 V
Brady Statistic AP VP Percent: 4.5 %
Brady Statistic AP VS Percent: 1 %
Brady Statistic AS VP Percent: 93 %
Brady Statistic AS VS Percent: 1.3 %
Brady Statistic RA Percent Paced: 3.9 %
Date Time Interrogation Session: 20250715020507
HighPow Impedance: 55 Ohm
Implantable Lead Connection Status: 753985
Implantable Lead Connection Status: 753985
Implantable Lead Connection Status: 753985
Implantable Lead Implant Date: 20240715
Implantable Lead Implant Date: 20240715
Implantable Lead Implant Date: 20240715
Implantable Lead Location: 753858
Implantable Lead Location: 753859
Implantable Lead Location: 753860
Implantable Lead Model: 7122
Implantable Pulse Generator Implant Date: 20240715
Lead Channel Impedance Value: 400 Ohm
Lead Channel Impedance Value: 410 Ohm
Lead Channel Impedance Value: 480 Ohm
Lead Channel Pacing Threshold Amplitude: 0.625 V
Lead Channel Pacing Threshold Amplitude: 0.875 V
Lead Channel Pacing Threshold Amplitude: 1.875 V
Lead Channel Pacing Threshold Pulse Width: 0.5 ms
Lead Channel Pacing Threshold Pulse Width: 0.5 ms
Lead Channel Pacing Threshold Pulse Width: 0.8 ms
Lead Channel Sensing Intrinsic Amplitude: 2.7 mV
Lead Channel Sensing Intrinsic Amplitude: 8.6 mV
Lead Channel Setting Pacing Amplitude: 1.875
Lead Channel Setting Pacing Amplitude: 2.5 V
Lead Channel Setting Pacing Amplitude: 2.875
Lead Channel Setting Pacing Pulse Width: 0.5 ms
Lead Channel Setting Pacing Pulse Width: 0.8 ms
Lead Channel Setting Sensing Sensitivity: 0.5 mV
Pulse Gen Serial Number: 211015851
Zone Setting Status: 755011

## 2024-01-16 ENCOUNTER — Telehealth: Payer: Self-pay | Admitting: *Deleted

## 2024-01-16 NOTE — Telephone Encounter (Signed)
 Received message from [employer] rep, Pam RN and pt Home Milrinone  was approved by [facility] Director, Christyne Sprang under letter of guarantee until 01/29/2024. TOC Director has requested updated plan for medication. Pam RN followed up with HF provider to discuss treatment after 01/29/2024. Continued discussion on continuation on medication being discussed by HF provider and [facility]. HF TOC CM will continue to assist with home needs. Will update TOC Director once plan has been made.   Delcia Crick RN3 CCM, Heart Failure TOC CM [phone]

## 2024-01-21 ENCOUNTER — Other Ambulatory Visit (HOSPITAL_COMMUNITY): Payer: Self-pay

## 2024-01-23 ENCOUNTER — Other Ambulatory Visit (HOSPITAL_COMMUNITY): Payer: Self-pay

## 2024-01-25 ENCOUNTER — Ambulatory Visit: Payer: Self-pay | Admitting: Cardiovascular Disease

## 2024-01-27 ENCOUNTER — Telehealth (HOSPITAL_COMMUNITY): Payer: Self-pay | Admitting: Cardiology

## 2024-01-27 NOTE — Telephone Encounter (Signed)
 Pam C,RN with [employer] called for update to patients plan of care.   [facility] approved milrinone  through 01/29/24 with letter of guarantee, no plan to renew at this time.   Will follow up with AHF providers for plan update as patients insurance will NOT cover medication and unsure if [facility] will cover medication long term   No follow up scheduled at this time

## 2024-01-27 NOTE — Telephone Encounter (Signed)
 Pt agreeable to 7/30 Will arrange   Hold eliquis  AM of procedure per DB  Pt aware of instructions, copy sent via mychart   [facility] [address] [place] [address] Dept: [phone] Loc: [phone]  Estha Few Boehringer  01/28/2024  You are scheduled for a Cardiac Catheterization on Wednesday, July 30 with Dr. Toribio Fuel.  1. Please arrive at the [facility] (Main Entrance A) at [facility]: [address] [place], [place] [address] at 6:30 AM (This time is 1.5 hour(s) before your procedure to ensure your preparation).   Free valet parking service is available. You will check in at ADMITTING. The support person will be asked to wait in the waiting room.  It is OK to have someone drop you off and come back when you are ready to be discharged.    Special note: Every effort is made to have your procedure done on time. Please understand that emergencies sometimes delay scheduled procedures.  2. Diet: No solid foods after midnight. You may have clear liquids until you leave for the hospital. (Light meal consist of plain toast, fruit, light soups, crackers)  3. Hydration: CHF: You may drink approved liquids (see below) until you leave for the hospital. On the way to the hospital, please drink 8 oz (1/2 plastic bottle) of water .   (List of approved liquids water , clear juice, clear tea, black coffee, fruit juices, non-citric and without pulp, carbonated beverages, Gatorade, Kool -Aid, plain Jello-O and plain ice popsicles)  4. Labs: Recent labs done 12/30/23  5. Medication instructions in preparation for your procedure:   Contrast Allergy: No    Stop taking Eliquis  (Apixiban) on Wednesday, July 30.  Stop taking, Torsemide  (Demadex ) Wednesday, July 30,     Do not take Diabetes Med Glucophage  (Metformin ) on the day of the procedure and HOLD 48 HOURS AFTER THE PROCEDURE.  On the  morning of your procedure, take your  and any morning medicines NOT listed above.  You may use sips of water .  6. Plan to go home the same day, you will only stay overnight if medically necessary. 7. Bring a current list of your medications and current insurance cards. 8. You MUST have a responsible person to drive you home. 9. Someone MUST be with you the first 24 hours after you arrive home or your discharge will be delayed. 10. Please wear clothes that are easy to get on and off and wear slip-on shoes.  Thank you for allowing us  to care for you!   -- [facility]

## 2024-01-28 ENCOUNTER — Telehealth: Payer: Self-pay | Admitting: *Deleted

## 2024-01-28 ENCOUNTER — Other Ambulatory Visit (HOSPITAL_COMMUNITY): Payer: Self-pay

## 2024-01-28 DIAGNOSIS — I5022 Chronic systolic (congestive) heart failure: Secondary | ICD-10-CM

## 2024-01-28 NOTE — Telephone Encounter (Signed)
 Received call from [facility] rep, Pam RN contacted HF provider and IP CM to discuss continuing letter of guarantee for Home Milrinone . LOG paperwork sent to IP Director for approval. LOG was approved. HF team will follow up with [facility] for plan. Delcia Crick RN3 CCM, Heart Failure TOC CM [phone]

## 2024-01-29 ENCOUNTER — Encounter (HOSPITAL_COMMUNITY): Payer: Self-pay | Admitting: Internal Medicine

## 2024-01-29 ENCOUNTER — Encounter (HOSPITAL_COMMUNITY)
Admission: RE | Disposition: A | Discharge status: Home / Self Care | Payer: Self-pay | Source: Home / Self Care | Attending: Internal Medicine

## 2024-01-29 ENCOUNTER — Other Ambulatory Visit: Payer: Self-pay

## 2024-01-29 ENCOUNTER — Ambulatory Visit (HOSPITAL_COMMUNITY)
Admission: RE | Admit: 2024-01-29 | Discharge: 2024-01-29 | Disposition: A | Discharge status: Home / Self Care | Payer: Medicare (Managed Care) | Attending: Internal Medicine | Admitting: Internal Medicine

## 2024-01-29 DIAGNOSIS — I5022 Chronic systolic (congestive) heart failure: Secondary | ICD-10-CM | POA: Diagnosis present

## 2024-01-29 DIAGNOSIS — I110 Hypertensive heart disease with heart failure: Secondary | ICD-10-CM | POA: Insufficient documentation

## 2024-01-29 DIAGNOSIS — E785 Hyperlipidemia, unspecified: Secondary | ICD-10-CM | POA: Diagnosis not present

## 2024-01-29 DIAGNOSIS — Z905 Acquired absence of kidney: Secondary | ICD-10-CM | POA: Insufficient documentation

## 2024-01-29 DIAGNOSIS — Z7901 Long term (current) use of anticoagulants: Secondary | ICD-10-CM | POA: Insufficient documentation

## 2024-01-29 DIAGNOSIS — Z86711 Personal history of pulmonary embolism: Secondary | ICD-10-CM | POA: Diagnosis not present

## 2024-01-29 DIAGNOSIS — Z7722 Contact with and (suspected) exposure to environmental tobacco smoke (acute) (chronic): Secondary | ICD-10-CM | POA: Insufficient documentation

## 2024-01-29 DIAGNOSIS — E119 Type 2 diabetes mellitus without complications: Secondary | ICD-10-CM | POA: Insufficient documentation

## 2024-01-29 DIAGNOSIS — Z8673 Personal history of transient ischemic attack (TIA), and cerebral infarction without residual deficits: Secondary | ICD-10-CM | POA: Insufficient documentation

## 2024-01-29 HISTORY — PX: RIGHT HEART CATH: CATH118263

## 2024-01-29 LAB — GLUCOSE, CAPILLARY
Glucose-Capillary: 97 mg/dL (ref 70–99)
Glucose-Capillary: 105 mg/dL — ABNORMAL HIGH (ref 70–99)

## 2024-01-29 LAB — POCT I-STAT EG7
Acid-Base Excess: 1 mmol/L (ref 0.0–2.0)
Acid-Base Excess: 1 mmol/L (ref 0.0–2.0)
Bicarbonate: 26.1 mmol/L (ref 20.0–28.0)
Bicarbonate: 26.7 mmol/L (ref 20.0–28.0)
Calcium, Ion: 1.19 mmol/L (ref 1.15–1.40)
Calcium, Ion: 1.24 mmol/L (ref 1.15–1.40)
HCT: 31 % — ABNORMAL LOW (ref 36.0–46.0)
HCT: 31 % — ABNORMAL LOW (ref 36.0–46.0)
Hemoglobin: 10.5 g/dL — ABNORMAL LOW (ref 12.0–15.0)
Hemoglobin: 10.5 g/dL — ABNORMAL LOW (ref 12.0–15.0)
O2 Saturation: 46 %
O2 Saturation: 47 %
Potassium: 4 mmol/L (ref 3.5–5.1)
Potassium: 4.1 mmol/L (ref 3.5–5.1)
Sodium: 142 mmol/L (ref 135–145)
Sodium: 141 mmol/L (ref 135–145)
TCO2: 27 mmol/L (ref 22–32)
TCO2: 28 mmol/L (ref 22–32)
pCO2, Ven: 43.7 mmHg — ABNORMAL LOW (ref 44–60)
pCO2, Ven: 44.7 mmHg (ref 44–60)
pH, Ven: 7.385 (ref 7.25–7.43)
pH, Ven: 7.385 (ref 7.25–7.43)
pO2, Ven: 26 mmHg — CL (ref 32–45)
pO2, Ven: 26 mmHg — CL (ref 32–45)

## 2024-01-29 SURGERY — RIGHT HEART CATH
Anesthesia: LOCAL

## 2024-01-29 MED ORDER — TORSEMIDE 20 MG PO TABS: 40.0000 mg | ORAL_TABLET | Freq: Every day | ORAL | 3 refills | Status: DC

## 2024-01-29 MED ORDER — FREE WATER: 250.0000 mL | Freq: Once | Status: DC

## 2024-01-29 MED ORDER — SODIUM CHLORIDE 0.9 % IV SOLN: 250.0000 mL | INTRAVENOUS | Status: DC | PRN

## 2024-01-29 MED ORDER — ACETAMINOPHEN 325 MG PO TABS
650.0000 mg | ORAL_TABLET | ORAL | Status: DC | PRN
  Administered 2024-01-29: 650 mg via ORAL
  Filled 2024-01-29: qty 2

## 2024-01-29 MED ORDER — HEPARIN (PORCINE) IN NACL 1000-0.9 UT/500ML-% IV SOLN
INTRAVENOUS | Status: DC | PRN
  Administered 2024-01-29: 500 mL

## 2024-01-29 MED ORDER — SODIUM CHLORIDE 0.9% FLUSH: 3.0000 mL | Freq: Two times a day (BID) | INTRAVENOUS | Status: DC

## 2024-01-29 MED ORDER — LIDOCAINE HCL (PF) 1 % IJ SOLN
INTRAMUSCULAR | Status: DC | PRN
  Administered 2024-01-29: 5 mL via INTRADERMAL

## 2024-01-29 MED ORDER — FUROSEMIDE 10 MG/ML IJ SOLN
INTRAMUSCULAR | Status: AC
  Filled 2024-01-29: qty 8

## 2024-01-29 MED ORDER — MILRINONE LACTATE IN DEXTROSE 20-5 MG/100ML-% IV SOLN: 0.3750 ug/kg/min | INTRAVENOUS | 3 refills | Status: DC

## 2024-01-29 MED ORDER — SODIUM CHLORIDE 0.9% FLUSH: 3.0000 mL | INTRAVENOUS | Status: DC | PRN

## 2024-01-29 MED ORDER — LIDOCAINE HCL (PF) 1 % IJ SOLN
INTRAMUSCULAR | Status: AC
  Filled 2024-01-29: qty 30

## 2024-01-29 MED ORDER — FUROSEMIDE 10 MG/ML IJ SOLN
INTRAMUSCULAR | Status: DC | PRN
  Administered 2024-01-29: 80 mg via INTRAVENOUS

## 2024-01-29 MED ORDER — HYDRALAZINE HCL 20 MG/ML IJ SOLN
10.0000 mg | INTRAMUSCULAR | Status: DC | PRN
Start: 2024-01-29 — End: 2024-01-29

## 2024-01-29 MED ORDER — ONDANSETRON HCL 4 MG/2ML IJ SOLN: 4.0000 mg | Freq: Four times a day (QID) | INTRAMUSCULAR | Status: DC | PRN

## 2024-01-29 MED ORDER — LABETALOL HCL 5 MG/ML IV SOLN: 10.0000 mg | INTRAVENOUS | Status: DC | PRN

## 2024-01-29 SURGICAL SUPPLY — 7 items
CATH SWAN GANZ 7F STRAIGHT (CATHETERS) IMPLANT
KIT MICROPUNCTURE NIT STIFF (SHEATH) IMPLANT
PACK CARDIAC CATHETERIZATION (CUSTOM PROCEDURE TRAY) ×1 IMPLANT
SHEATH PINNACLE 7F 10CM (SHEATH) IMPLANT
SHEATH PROBE COVER 6X72 (BAG) IMPLANT
TRANSDUCER W/STOPCOCK (MISCELLANEOUS) IMPLANT
TUBING ART PRESS 72 MALE/FEM (TUBING) IMPLANT

## 2024-01-29 NOTE — Interval H&P Note (Signed)
 History and Physical Interval Note:  01/29/2024 12:10 PM  Lynn Estrada  has presented today for surgery, with the diagnosis of heart failure.  The various methods of treatment have been discussed with the patient and family. After consideration of risks, benefits and other options for treatment, the patient has consented to  Procedure(s): RIGHT HEART CATH (N/A) as a surgical intervention.  The patient's history has been reviewed, patient examined, no change in status, stable for surgery.  I have reviewed the patient's chart and labs.  Questions were answered to the patient's satisfaction.     Cypress Hinkson

## 2024-01-29 NOTE — Discharge Instructions (Addendum)
 Internal jugular Site Care   This sheet gives you information about how to care for yourself after your procedure. Your health care provider may also give you more specific instructions. If you have problems or questions, contact your health care provider. What can I expect after the procedure? After the procedure, it is common to have: Bruising and tenderness at the catheter insertion area. Follow these instructions at home:  Insertion site care Follow instructions from your health care provider about how to take care of your insertion site. Make sure you: Wash your hands with soap and water  before you change your bandage (dressing). If soap and water  are not available, use hand sanitizer. Remove your dressing as told by your health care provider. In 24 hours Check your insertion site every day for signs of infection. Check for: Redness, swelling, or pain. Pus or a bad smell. Warmth. You may shower 24-48 hours after the procedure. Do not apply powder or lotion to the site.  Activity For 24 hours after the procedure, or as directed by your health care provider: Do not push or pull heavy objects with the affected arm. Do not drive yourself home from the hospital or clinic. You may drive 24 hours after the procedure unless your health care provider tells you not to. Do not lift anything that is heavier than 10 lb (4.5 kg), or the limit that you are told, until your health care provider says that it is safe.  For 24 hours

## 2024-02-21 ENCOUNTER — Telehealth (HOSPITAL_COMMUNITY): Payer: Self-pay

## 2024-02-21 NOTE — Telephone Encounter (Signed)
 Patient left message inquiring about cardiac rehab referral from [facility]. Attempted to return call- no answer, left message informing patient we have not received a referral yet but will call her when we do.

## 2024-02-25 ENCOUNTER — Encounter (HOSPITAL_COMMUNITY): Payer: Self-pay

## 2024-02-25 ENCOUNTER — Telehealth (HOSPITAL_COMMUNITY): Payer: Self-pay

## 2024-02-25 NOTE — Telephone Encounter (Signed)
 Outside/paper referral received by Dr. Oleh Genin from [place]. Will fax over Physician order and request further documents. Insurance benefits and eligibility to be determined.

## 2024-02-25 NOTE — Telephone Encounter (Signed)
 Attempted to call patient in regards to Cardiac Rehab - LM on VM

## 2024-03-11 ENCOUNTER — Telehealth (HOSPITAL_COMMUNITY): Payer: Self-pay

## 2024-03-11 NOTE — Telephone Encounter (Signed)
 Attempted f/u call regarding cardiac rehab- no answer, left message. Sent MyChart message.  Closing referral.

## 2024-03-11 NOTE — Telephone Encounter (Signed)
 Patient called back requesting referral be reopened. Will pass to nurse navigator for review and verify insurance. Patient would like us  to call her back next week.

## 2024-03-17 ENCOUNTER — Other Ambulatory Visit (HOSPITAL_COMMUNITY): Payer: Self-pay

## 2024-03-17 MED ORDER — TACROLIMUS 5 MG PO CAPS
10.0000 mg | ORAL_CAPSULE | Freq: Two times a day (BID) | ORAL | 11 refills | Status: DC
  Filled 2024-03-17: qty 120, 30d supply, fill #0

## 2024-03-18 ENCOUNTER — Telehealth: Payer: Self-pay | Admitting: Cardiology

## 2024-03-18 ENCOUNTER — Telehealth (HOSPITAL_COMMUNITY): Payer: Self-pay

## 2024-03-18 ENCOUNTER — Other Ambulatory Visit (HOSPITAL_COMMUNITY): Payer: Self-pay

## 2024-03-18 NOTE — Telephone Encounter (Signed)
 Patient called to follow-up on getting orders for rehab.

## 2024-03-18 NOTE — Telephone Encounter (Signed)
 Left message for patient to call back

## 2024-03-18 NOTE — Telephone Encounter (Signed)
 Received [employer] from Rolin Blush, Pamela Ingalls and Sharlet Servant stating patient was asking them about the status of her cardiac rehab referral.  Attempted to call patient- no answer, left message informing her that she needs to attend her upcoming f/u appt.   If she calls back, let her know she must attend her appt on 9/25 at [facility]. We are also waiting to receive the EKG we requested.

## 2024-03-18 NOTE — Telephone Encounter (Signed)
 Dr. Arnie has referred you to the Onsite Cardiac Rehabilitation Program at [facility]. Unfortunately, we have been unable to reach you by telephone and will be closing your referral. If you would like your referral reopened, please contact us  at [phone] during the hours listed below. Any staff member will be happy to assist you.     Hours: Monday - Friday:  8:00 am to 4:30 pm   Will forward to Cardiac Rehab to contact pt.

## 2024-04-03 ENCOUNTER — Telehealth (HOSPITAL_COMMUNITY): Payer: Self-pay

## 2024-04-03 NOTE — Telephone Encounter (Signed)
 Another referral received from [facility] for cardiac rehab, unsure of why as the current cardiac rehab referral we have is from [facility] (patient initially declined cardiac rehab and later requested it be reopened, which is why there has been a delay in scheduling). Still waiting for her to complete her f/u appt.

## 2024-04-03 NOTE — Telephone Encounter (Signed)
 Attempted to call patient again to go over her referral, we are waiting for her to attend her f/u appts on 10/09 before we can schedule her. No answer, left message.

## 2024-04-03 NOTE — Telephone Encounter (Signed)
 Another referral for cardiac rehab received from [facility]. Called the Heart Transplant Coordinator Wes [place] who sent it, asked why we were receiving a second referral from them when we already have an active [facility] referral as well and the one [facility] sent yesterday.  He stated patient called them and claimed we had not reached out to her, which is why he sent a new referral. Explained we have been trying to reach patient but she has not answered her phone or responded to our messages, we need her to attend her f/u with the transplant team on 10/09. Wes stated he will let patient know if she calls him back again.

## 2024-04-08 NOTE — Progress Notes (Signed)
 Remote ICD Transmission

## 2024-04-08 NOTE — Progress Notes (Signed)
 [facility] Cardiac Transplant Clinic Note  Reason for Consultation: S/P orthotopic heart transplantation  Problem List: 1. 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  TTE on 8/27 showing preserved LV function, no significant valvular disease  B.  Stress TTE N/A  C.  Coronary angiogram -N/A D.  EM biopsy on 8/22 2R, pAMR 2, recurrent 2R (03/12/24) s/p pred burst, 1R, pAMR2 s/p additional pred burst (03/26/24)   E. RHC 8/22 RA 12, PA 43/20/30, WP24 CI 3.6 Significant rejection history: 8/22 2R, pAMR 2 (low FK 5.5, HD stable, normal graft, pulse steroid) 2. Post Tx complications/hospitalizations: none 3. Hypertension  4. Dyslipidemia  Medications: Current Outpatient Medications  Medication Sig Dispense Refill  . aspirin  81 MG EC tablet Take 1 tablet (81 mg total) by mouth once daily 30 tablet 11  . atovaquone (MEPRON) 750 mg/5 mL suspension Take 10 mLs (1,500 mg total) by mouth daily with breakfast 300 mL 11  . blood glucose diagnostic test strip 1 each (1 strip total) 4 (four) times daily Use as instructed. 125 strip 11  . calcium  carbonate-vitamin D3 (CALTRATE 600+D) 600 mg-10 mcg (400 unit) tablet Take 1 tablet by mouth 2 (two) times daily at lunch and dinner 60 tablet 11  . dilTIAZem (CARDIZEM CD) 120 MG XR capsule Take 1 capsule (120 mg total) by mouth once daily 30 capsule 11  . FUROsemide  (LASIX ) 20 MG tablet Take 2 tablets (40 mg total) by mouth once daily for 60 days 60 tablet 1  . insulin  ASPART (NOVOLOG  FLEXPEN) pen injector (concentration 100 units/mL) Inject subcutaneously as directed three times daily by [facility]. Maximum total daily dose 58 units. (Patient taking differently: Patient currently taking 7 units + CS before lunch and only CS for breakfast and lunch meal coverage ( CS only if BG >200)) 15 mL 3  . lancets Use 1 each 3 (three) times daily Use as  instructed. 400 each 3  . magnesium  oxide (MAG-OX) 400 mg (241.3 mg magnesium ) tablet Take 1 tablet (400 mg total) by mouth 2 (two) times daily at lunch and dinner 60 tablet 11  . melatonin 3 mg tablet Take 1 tablet (3 mg total) by mouth at bedtime 30 minutes before bedtime. 30 tablet 11  . metFORMIN  (GLUCOPHAGE -XR) 500 MG XR tablet Take 2 tablets (1,000 mg total) by mouth 2 (two) times daily with meals 120 tablet 14  . mycophenolate (CELLCEPT) 250 mg capsule Take 6 capsules (1,500 mg total) by mouth every 12 (twelve) hours 360 capsule 11  . pantoprazole (PROTONIX) 40 MG DR tablet Take 1 tablet (40 mg total) by mouth once daily 30 tablet 11  . pen needle, diabetic (ULTRA-FINE PEN NEEDLE) 31 gauge x 5/16 needle Use as directed with each insulin  injection. 100 each 11  . predniSONE (DELTASONE) 5 MG tablet Take 3 tablets (15 mg total) by mouth once daily 90 tablet 11  . rosuvastatin (CRESTOR) 10 MG tablet Take 1 tablet (10 mg total) by mouth once daily 30 tablet 11  . tacrolimus  (PROGRAF ) 5 MG capsule Take 2 capsules (10 mg total) by mouth every 12 (twelve) hours 120 capsule 11  . valGANciclovir (VALCYTE) 450 mg tablet Take 1 tablet (450 mg total) by mouth every evening for 90 days 30 tablet 2  . predniSONE (DELTASONE) 20 MG tablet Take 4 tablets (80 mg total) by mouth 2 (two) times daily for 3 days  24 tablet 0  . sennosides-docusate (SENOKOT-S) 8.6-50 mg tablet Take 1-2 tablets twice daily as needed to prevent constipation 30 tablet 2   No current facility-administered medications for this visit.   Facility-Administered Medications Ordered in Other Visits  Medication Dose Route Frequency Provider Last Rate Last Admin  . acetaminophen  (TYLENOL ) tablet 650 mg  650 mg Oral Q6H PRN Evins Ozell Ade, PA      . atropine injection solution 0.5 mg  0.5 mg Intravenous Once PRN Evins Ozell Ade, PA      . sodium chloride  0.9 % bolus 250 mL  250 mL Intravenous Once PRN Evins Ozell Ade,  PA        Allergies: Allergies as of 04/09/2024 - Reviewed 04/09/2024  Allergen Reaction Noted  . Bactrim [sulfamethoxazole -trimethoprim] Itching and Rash 10/24/2023  . Carvedilol  Itching 10/24/2023  . Latex, natural rubber Rash 10/24/2023  . Metoprolol  Itching 10/24/2023    Interval History: S/p OHT 02/13/2024. Recently discharged from her index hospitalization 02/28/2024. Hospital course c/b HD stable 2R/pAMR2 rejection in setting of low FK. Had recurrent 2R s/p pred burst on 9/10-9.12, normal EF at that time.  Last visit 03/26/24 - Echo at that time EF > 55%, EMBx 1R, pAMR2. During EMBx RA pressure 8-10. RVSP 31-49. Underwent pred burst (80 mg x3). HLAs evaluated and notable for mild elevated in Class 1 ABs, but no DSAs. Plan to continue to monitor after pred burst. She presents for re-evaluation today.  Since we last saw her, she is overall doing ok. She has a mild spot of swelling on the upper right portion of sternal wound. Very soft. Not red, not warm. Has been there on and off she says since surgery but may be getting slightly larger in recent days.  She has very mild LE edema, but this is improving. Her appetite is coming around and BMs are normal. Patient reports no dyspnea, no PND, no orthopnea, no N/V, no palpitations, no chest pain, no F/C, and no diarrhea. Patient is tolerant of all immunosuppressants.  ROS: 10 point review of systems was performed and is negative unless otherwise detailed  Social History: No current tobacco abuse  Physical Exam: Vitals:   04/09/24 0950  BP: 137/84  Pulse: 88  Resp: 20  Temp: 36.5 C (97.7 F)     Body mass index is 23.1 kg/m. General: NAD HEENT: oropharynx benign, neck supple CV:   Carotids - normal upstroke  JVP - is not elevated  Auscultation - RRR, reg S1 and S2  Pulses - 2+ throughout Lungs: CTAB Abdomen: soft, NT, ND Extremities: 1+ edema in legs Neurologic: CN 2-12 intact, non-focal MSK: Full ROM throughout Skin:  no rash, lesions, or ulcers    Accessory Clinical Data: Notable Labs (all others reviewed in Epic): Cr 1.5* (10/09) (baseline) hgb 9.8* (10/09), Plt 274 (10/09), FK level 7.6 (10/09)  Assessment and Plan: 63 y.o. female S/P OHT. She looks overall well today.   #) S/P OHT #) AMR  : Clinically, Lynn Estrada does not have evidence of allograft dysfunction.  She had early rejection 8/22 (2R, pAMR - HD stable, normal graft, in setting of low FK) and recurrent 2R (03/12/24) s/p pred burst, as well as recurrent 1R, pAMR2 s/p additional pred burst (03/26/24).  - HLAs (03/26/24) - no DSA, mild increase in CLass 1 non-dsa Abs.  - TTE personally reviewed - normal LV systolic function, normal RVSF, trivial MR, mild TR.IVC > 2cm and doesn't collapse - EMBx pending today  -  Ok to continue lasix  po 40 mg qd.  - DSAs in process.  - IS w/ tacro (goal 8-12), level today 7.96. Would inccrease by 1 mg.   MMF 1500mg  BID, pred taper - PCP ppx w/ bactrim - CMV intermediate risk, valcyte for 3 months (until 05/16/2024) - ASA, Pravastatin daily - Cardiac Rehab: has not been cleared to start cardiac rehab. Would like to participate at a [facility] location close to home for her. Needs her referral. We will eval sternum first.  # Small swelling lateral to sternum See pictures above, area is not warm or red. -CXR today wnl. - Please obtain CT chest with contrast to evaluate this area, and once it has resulted we can discuss with our surgical team. I do not suspect infection based on exam, but need to rule out.   #) CKD Cr 1.5 today, from 1.8. Stable. Encourage hydration. Given early rejection, will not change CNI dosing.    #) Hypertension:  Near goal. NO changes.   # Sleep Disturbances -Melatonin ordered.  #) Right IJ occlusive thrombus - will plan for biopsy in cath lab - defer starting [facility] for now given frequent EMB needs  #) Health maintenance:  - Continue Calcium /Vitamin D supplementation -  Patient is recommended to have an annual complete skin exam - Flu/COVID 3 months after OHT  Return to clinic as regularly scheduled. We will call patient and discuss any necessary immunosuppression changes based on pathology results from the biopsy, echocardiogram, and current immunosuppressant drug levels.   Lynn Doles, MD Advanced Heart Failure Team [facility]   This note has been created using automated tools and reviewed for accuracy by [facility].

## 2024-04-09 NOTE — Progress Notes (Signed)
..  Patient meets discharge criteria:  Vital signs returned to baseline  O2 saturations are stable and at baseline  Absent of nausea and vomiting.  Skin warm and dry.  Pain score <5 on 0-10 scale or at baseline  Protective reflexes are present  Absent of bladder distention.  Tube, catheters drains patent  Oral temperature > 35.5C  Dressing dry; or marked if drainage is present.   PIV removed with tip intact. Discussed results of procedure with physician and verbalized understanding. Telemetry discontinued.   Patient discharge instructions given to patient and family member. Reviewed sedation, S&S to report to provider, follow-up appointments, prescription medications,  diet, and activity, with patient and family member/friend.   Patient discharged with printed AVS instructions. Patient verbalized understanding and ability to comply. The personal belongings were returned to the patient. Patient and family member/friend state that they have no further questions at this time and will contact the numbers provided if any issues arise.  Patient states they feel comfortable leaving.     Pt transported for discharge by family in wheelchair and sent home to be cared by self and family.  Discharge time: 04/09/2024 Discharge destination: Home  Signed: FULTON BOLOGNA, RN                 04/09/2024

## 2024-04-09 NOTE — Progress Notes (Signed)
 Lynn Estrada seen in cardiac transplant clinic on April 09, 2024 by Dr. Quintin.  She is cleared to undergo endomyocardial biopsy (3+1) to assess for presence and severity of rejection Echocardiogram to be done in [facility].

## 2024-04-09 NOTE — Progress Notes (Signed)
 Cardiac Catheterization Pre-Procedure  I have reviewed the medical and surgical history, family history, review of systems, current medications, allergies and sensitivities, physical examination, laboratory and diagnostic data.  Lynn Estrada is a 63 y.o. 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 Presents today for endomyocardial biopsy (3+1) to assess for presence and severity of rejection.  Echocardiogram to be done in [facility].  Labs:  Recent Labs  Lab 04/09/24 0917  NA 142  K 3.6  CL 104  CO2 26  BUN 36*  CREATININE 1.5*  GLUCOSE 141*  CALCIUM  8.7  MG 1.6*   Recent Labs  Lab 04/09/24 0917  WBC 6.5  HGB 9.8*  HCT 31.4*  PLT 274   No results for input(s): APTT, INR in the last 168 hours.   EKG: NSR Plan for procedure: cardiac catheterization, EMBx (3+1) Alternative options: no catheterization The procedures are indicated to evaluate and/or treat (diagnosis): OHT   Plan for moderate sedation and analgesia during the invasive procedure. Adverse experiences with sedation / analgesia reviewed, the patient is an appropriate candidate.  Planned sedation may include: Diphenhydramine  (Benadryl ), Midazolam  (Versed ), Hydromorphone (Dilaudid) and Fentanyl  (Sublimaze ).   ASA Classification: II - Mild disease  Mallampati Classification: deferred  Plan for blood product transfusion: transfusion not anticipated  PHYSICAL EXAM: BP (!) 146/94   Pulse 90   Temp 36.7 C (98 F) (Oral)   Resp 18   Ht 188 cm (6' 2.02)   Wt 81.6 kg (179 lb 14.3 oz)   SpO2 99%   BMI 23.09 kg/m  Body mass index is 23.09 kg/m. General: alert, cooperative, pleasant, in no acute distress Head:  normocephalic, atraumatic Heart: regular rate and rhythm, without murmur Lungs: clear to auscultation, without rales or wheeze Abdomen: soft, nondistended, nontender Ext: no edema in LE bilaterally, 2+ femoral pulses (no bruit), 2+ radial pulses, 2+ DP/PTs  The patient was interviewed and examined, and the available chart reviewed.  The indications, procedure, risk, potential complications (as listed on the respective consent forms), and alternatives for both the procedure and for moderate sedation were explained and discussed.  The patient understands and has signed the informed consent for the procedure and the informed consent for moderate sedation.  Plan for post-procedure care needs: Outpatient holding  Lynn BARBARANN MASSA, NP 04/09/2024

## 2024-04-13 ENCOUNTER — Telehealth (HOSPITAL_COMMUNITY): Payer: Self-pay

## 2024-04-13 NOTE — Telephone Encounter (Signed)
 Patient returned call, stated she is in [facility] hospital currently. Will pass referral to nurse navigator for further review.

## 2024-04-13 NOTE — Telephone Encounter (Signed)
 Pt insurance is active and benefits verified through [facility] [employer]. Co-pay $0, DED $257/$257 met, out of pocket $9,350/$300.40 met, co-insurance 20%. No pre-authorization required. Passport, 04/13/2024 @ 3:23pm, REF# [id number].  2ndary insurance is active and benefits verified through [facility] [employer]. Co-pay $0, DED $0/$0 met, out of pocket $0/$0 met, co-insurance 0%. No pre-authorization required.   TCR/ICR? ICR Visit(date of service)limitation? No Can multiple codes be used on the same date of service/visit?(IF ITS A LIMIT) N/A  Is this a lifetime maximum or an annual maximum? Annual Has the member used any of these services to date? No Is there a time limit (weeks/months) on start of program and/or program completion? No

## 2024-04-13 NOTE — Telephone Encounter (Signed)
 Attempted to call patient to schedule cardiac rehab- no answer, left message. Sent MyChart message.

## 2024-04-21 NOTE — Progress Notes (Signed)
 [facility] Cardiac Transplant Clinic Note  Reason for Consultation: S/P orthotopic heart transplantation  Problem List: 1. 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  TTE on 8/27 showing preserved LV function, no significant valvular disease  B.  Stress TTE N/A  C.  Coronary angiogram -N/A D.  EM biopsy on 8/22 2R, pAMR 2, recurrent 2R (03/12/24) s/p pred burst, 1R, pAMR2 s/p additional pred burst (03/26/24). 04/09/24-1R, pAMR2.   E. RHC 8/22 RA 12, PA 43/20/30, TE75 CI 3.6 Significant rejection history: 8/22 2R, pAMR 2 (low FK 5.5, HD stable, normal graft, pulse steroid). ecurrent 2R (03/12/24) s/p pred burst, 1R, pAMR2 s/p additional pred burst (03/26/24). 04/09/24-1R, pAMR2 s/p PLEX x 4 + IVIG x 1 + IV CS x 3.  2. Post Tx complications/hospitalizations: none 3. Hypertension  4. Dyslipidemia  Medications: Current Outpatient Medications  Medication Sig Dispense Refill  . aspirin  81 MG EC tablet Take 1 tablet (81 mg total) by mouth once daily 30 tablet 11  . atovaquone (MEPRON) 750 mg/5 mL suspension Take 10 mLs (1,500 mg total) by mouth daily with breakfast 300 mL 11  . blood glucose diagnostic test strip 1 each (1 strip total) 4 (four) times daily Use as instructed. 360 each 0  . calcium  carbonate-vitamin D3 (CALTRATE 600+D) 600 mg-10 mcg (400 unit) tablet Take 1 tablet by mouth 2 (two) times daily at lunch and dinner 60 tablet 11  . dilTIAZem (CARDIZEM CD) 120 MG XR capsule Take 1 capsule (120 mg total) by mouth once daily 30 capsule 11  . FUROsemide  (LASIX ) 20 MG tablet Take 2 tablets (40 mg total) by mouth once daily 60 tablet 11  . lancets Use 1 each 3 (three) times daily Use as instructed. 400 each 3  . magnesium  oxide (MAG-OX) 400 mg (241.3 mg magnesium ) tablet Take 1 tablet (400 mg total) by mouth 2 (two) times daily at lunch and dinner 60 tablet 11  . melatonin 3 mg tablet  Take 1 tablet (3 mg total) by mouth at bedtime 30 minutes before bedtime. 30 tablet 11  . metFORMIN  (GLUCOPHAGE -XR) 500 MG XR tablet Take 2 tablets (1,000 mg total) by mouth 2 (two) times daily with meals 120 tablet 14  . mycophenolate (CELLCEPT) 250 mg capsule Take 6 capsules (1,500 mg total) by mouth every 12 (twelve) hours 360 capsule 11  . pantoprazole (PROTONIX) 40 MG DR tablet Take 1 tablet (40 mg total) by mouth once daily 30 tablet 11  . predniSONE (DELTASONE) 5 MG tablet Take 3 tablets (15 mg total) by mouth once daily 90 tablet 11  . rosuvastatin (CRESTOR) 10 MG tablet Take 1 tablet (10 mg total) by mouth once daily 30 tablet 11  . sennosides-docusate (SENOKOT-S) 8.6-50 mg tablet Take 1-2 tablets twice daily as needed to prevent constipation 30 tablet 2  . tacrolimus  (ENVARSUS  XR) 1 mg extended-release tablet Take 2 tablets (2 mg total) by mouth once daily Take four 4mg  tablets (16mg  total) and two 1mg  tablets (2mg  total) to equal 18mg  daily 60 tablet 3  . tacrolimus  (ENVARSUS  XR) 4 mg extended-release tablet Take 4 tablets (16 mg total) by mouth once daily Take four 4mg  tablets (16mg  total) and two 1mg  tablets (2mg  total) to equal 18mg  daily 120 tablet 3  . valGANciclovir (VALCYTE) 450 mg tablet Take 1 tablet (450 mg total) by mouth every evening for 90 days 30 tablet 2  .  flash glucose sensor (FREESTYLE LIBRE 2 SENSOR) Kit Use 1 kit every 14 (fourteen) days (Patient not taking: Reported on 04/23/2024) 6 each 0  . Pharmacy Test Claim Take by mouth once daily TEST CLAIM ONLY (Patient not taking: Reported on 04/23/2024) 120 tablet 0   No current facility-administered medications for this visit.    Allergies: Allergies as of 04/23/2024 - Reviewed 04/23/2024  Allergen Reaction Noted  . Bactrim [sulfamethoxazole -trimethoprim] Itching and Rash 10/24/2023  . Carvedilol  Itching 10/24/2023  . Latex, natural rubber Rash 10/24/2023  . Metoprolol  Itching 10/24/2023    Interval History: S/p  OHT 02/13/2024. Recently discharged from her index hospitalization 02/28/2024. Hospital course c/b HD stable 2R/pAMR2 rejection in setting of low FK. Had recurrent 2R s/p pred burst on 9/10-9.12, normal EF at that time.  Last visit 04/09/24 - at that time had 1R, pAMR2. Received inpatient admission with IV solumedrol 500mg  x3, PLEX x4, and IVIG x1. FK level subtherapeutic on admission, switched from prograf  to envarsus  XR to help kidney function. Continued home diltiazem booster. Brief IV diuresis with IV steroids then resumed home lasix  40mg  qd. HLAs 04/13/2024 without detectable DSA.   History of Present Illness She has been feeling well since her last hospitalization.  She is taking 40 mg of Lasix  daily but has not taken it today due to pending blood work. She reports a small amount of LE swelling, which was more pronounced a month ago and even two weeks ago.  She reports her blood sugar levels have been fluctuating but are generally manageable, with recent readings ranging from 128 to 185 mg/dL. She is currently on a prednisone dose of 15 mg.  She mentions a spot on her skin near her impella wound site that she thinks may have some retained gauze.  No drainage, no fevers or chills, no redness or warmth..  Her appetite is coming around and BMs are normal. Patient reports no dyspnea, no PND, no orthopnea, no N/V, no palpitations, no chest pain, no F/C, and no diarrhea. Patient is tolerant of all immunosuppressants.  ROS: 10 point review of systems was performed and is negative unless otherwise detailed  Social History: No current tobacco abuse  Physical Exam: Vitals:   04/23/24 0852  BP: 131/86  Pulse: 91  Resp: 12  Temp: 36.6 C (97.9 F)    Body mass index is 22.67 kg/m. General: NAD HEENT: oropharynx benign, neck supple CV:   Carotids - normal upstroke  JVP - is not elevated  Auscultation - RRR, reg S1 and S2  Pulses - 2+ throughout Lungs: CTAB Abdomen: soft, NT,  ND Extremities: trace LE edema in legs Neurologic: CN 2-12 intact, non-focal MSK: Full ROM throughout Skin: no rash, lesions, or ulcers  Accessory Clinical Data: Notable Labs (all others reviewed in Epic): Cr 1.4* (10/23) (baseline) hgb 8.3* (10/23), Plt 242 (10/23), FK level 3.7* (10/23)  Assessment and Plan: 63 y.o. female S/P OHT. She has had multiple epsiodes of 2R and pAMR rejection early on, but she looks overall well today.   #) S/P OHT #) AMR  : Clinically, Lynn Estrada does not have evidence of allograft dysfunction.  She had mutlipe episodes of early 2R and also pAMR 2 rejection, most recently 04/09/24-1R, pAMR2 s/p PLEX x 4 + IVIG x 1 + IV CS x 3.  - TTE personally reviewed, final read pending- low normal LV systolic function ~50%, normal RVSF. - EMBx pending today  -RHC today - RA 6, RV 45/2, PA 45/20 (m32), catheter  did not wedge. CI 3.3 L/min.  - DSAs in process from 10/18.  - IS w/ envarsus  (goal 8-12), level 3.7 today on 18 mg qd. Please increase to 20 mg qd and ensure that she is taking dilt.   MMF 1500mg  BID, pred 15 mg qd- do not wean at this time in view of low FK level - PCP ppx w/ bactrim - CMV intermediate risk, valcyte for 3 months (until 05/16/2024) - ASA, rosuvastatin daily - Please change lasix  to 40 mg qd PRN for swelling or DOE, in view of RHC numbers.  - Cardiac Rehab: please ensure she is referred to cardiac rehab.    #) CKD Cr 1.4 today, overall stable.    #) Hypertension:  At goal. NO changes.   #) Right IJ occlusive thrombus - will plan for biopsy in cath lab - Once she is cleared for monthly EMBx, then she can start apixaban  5 mg BID  #) Health maintenance:  - Continue Calcium /Vitamin D supplementation - Patient is recommended to have an annual complete skin exam - Flu/COVID 3 months after OHT  Return to clinic as regularly scheduled. We will call patient and discuss any necessary immunosuppression changes based on pathology results  from the biopsy, echocardiogram, and current immunosuppressant drug levels.  Attestation Statement:   I personally performed the service. (TP)  Fairy Doles, MD Advanced Heart Failure Team [facility]   This note has been created using automated tools and reviewed for accuracy by FAIRY KATHEE DOLES.

## 2024-04-23 NOTE — Progress Notes (Signed)
 Cardiac Catheterization Pre-Procedure  I have reviewed the medical and surgical history, family history, review of systems, current medications, allergies and sensitivities, physical examination, laboratory and diagnostic data.  Lynn Estrada is a 63 y.o. 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 Presents today for endomyocardial biopsy (3+1) to assess for presence and severity of rejection + right heart catheterization to assess volume status and reassess pulmonary artery pressures.   Echocardiogram to be done in [facility] prior to procedure.    Labs:  Recent Labs  Lab 04/17/24 0605 04/18/24 1028 04/23/24 0836  NA 137 142 141  K 4.0 3.7 3.7  CL 103 112* 106  CO2 20* 21 25  BUN 84* 51* 33*  CREATININE 1.5* 1.1* 1.4*  GLUCOSE 210* 102 129  CALCIUM  9.1 8.1* 9.1  MG  --   --  1.8   Recent Labs  Lab 04/18/24 0829 04/18/24 1027 04/23/24 0836  WBC 7.5 7.1 5.1  HGB 8.7* 8.9* 8.3*  HCT 27.5* 28.1* 26.8*  PLT 213 208 242   No results for input(s): APTT, INR in the last 168 hours.   EKG: NSR Plan for procedure: cardiac catheterization, RHC + EMBX Alternative options: no catheterization The procedures are indicated to evaluate and/or treat (diagnosis): OHT   Plan for moderate sedation and analgesia during the invasive procedure. Adverse experiences with sedation / analgesia reviewed, the patient is an appropriate candidate.  Planned sedation may include: Diphenhydramine  (Benadryl ), Midazolam  (Versed ), Hydromorphone (Dilaudid) and Fentanyl  (Sublimaze ).   ASA Classification: II - Mild disease  Mallampati Classification: deferred  Plan for blood product transfusion: transfusion not  anticipated  PHYSICAL EXAM: BP (!) 142/96   Pulse 93   Temp 36.8 C (98.2 F) (Oral)   Resp 17   Ht 188 cm (6' 2.02)   Wt 80.1 kg (176 lb 9.4 oz)   SpO2 95%   BMI 22.66 kg/m  Body mass index is 22.66 kg/m. General: alert, cooperative, pleasant, in no acute distress Head: normocephalic, atraumatic Heart: regular rate and rhythm, without murmur Lungs: clear to auscultation, without rales or wheeze Abdomen: soft, nondistended, nontender Ext: trace edema in LE bilaterally, 2+ femoral pulses (no bruit), 2+ radial pulses, 1+ DP/PTs  The patient was interviewed and examined, and the available chart reviewed.  The indications, procedure, risk, potential complications (as listed on the respective consent forms), and alternatives for both the procedure and for moderate sedation were explained and discussed.  The patient understands and has signed the informed consent for the procedure and the informed consent for moderate sedation.  Plan for post-procedure care needs: Outpatient holding  Lynn BARBARANN MASSA, NP 04/23/2024

## 2024-04-24 ENCOUNTER — Telehealth (HOSPITAL_COMMUNITY): Payer: Self-pay

## 2024-04-24 ENCOUNTER — Telehealth (HOSPITAL_COMMUNITY): Payer: Self-pay | Admitting: *Deleted

## 2024-04-24 NOTE — Telephone Encounter (Signed)
 Received call from [facility] cardiology, they stated patient called them asking why they haven't been scheduled for cardiac rehab yet. We last spoke to patient on 10/13 to try and schedule and learned she had been admitted to [facility]; we have been waiting on her to have a follow-up for medical clearance to attend cardiac rehab.  [facility] cardiology stated she had a f/u appt with them yesterday 10/23 and the doctor has cleared her to start cardiac rehab. Passing to nurse for review.

## 2024-04-24 NOTE — Telephone Encounter (Signed)
 Chart review. Patient is appropriate to be scheduled for exercise at cardiac rehab.Hadassah Elpidio Quan RN BSN

## 2024-04-24 NOTE — Telephone Encounter (Signed)
 Patient called wanting to know when she can get scheduled for cardiac rehab, explained that our nurse will review the notes from [facility] cardiology to see if she is cleared to return to cardiac rehab. Patient was upset that the scheduling has been 'put off a month', explained to patient that due to her hospitalization this month she needs medical clearance to start the rehab program and we have been waiting on her follow-up appt. Informed patient we will call her to schedule as soon as she we have confirmed medical clearance.

## 2024-04-24 NOTE — Telephone Encounter (Signed)
 Patient called back to get scheduled in the Cardiac Rehab Program. Patient will come in for orientation on 11/20 and will attend the 10:15 exercise class.  Sent MyChart message.

## 2024-04-24 NOTE — Telephone Encounter (Signed)
 Lynn Estrada cleared patient for scheduling. Attempted to call patient to schedule cardiac rehab- no answer, left message. Sent MyChart message.

## 2024-05-05 ENCOUNTER — Other Ambulatory Visit (HOSPITAL_COMMUNITY): Payer: Self-pay

## 2024-05-20 ENCOUNTER — Telehealth (HOSPITAL_COMMUNITY): Payer: Self-pay

## 2024-05-20 NOTE — Telephone Encounter (Signed)
 Attempted to confirm cardiac rehab orientation date of 05/21/24 @ 1030.

## 2024-05-21 ENCOUNTER — Encounter (HOSPITAL_COMMUNITY)
Admission: RE | Admit: 2024-05-21 | Discharge: 2024-05-21 | Disposition: A | Discharge status: Home / Self Care | Source: Ambulatory Visit | Attending: Cardiology | Admitting: Cardiology

## 2024-05-21 ENCOUNTER — Telehealth (HOSPITAL_COMMUNITY): Payer: Self-pay | Admitting: Internal Medicine

## 2024-05-21 VITALS — BP 130/80 | HR 90 | Ht 74.0 in | Wt 179.5 lb

## 2024-05-21 DIAGNOSIS — Z941 Heart transplant status: Secondary | ICD-10-CM | POA: Insufficient documentation

## 2024-05-21 NOTE — Progress Notes (Signed)
 Cardiac Individual Treatment Plan  Patient Details  Name: Lynn Estrada MRN: [id number] Date of Birth: 26-Nov-1960 Referring Provider:   Flowsheet Row INTENSIVE CARDIAC REHAB ORIENT from 05/21/2024 in [facility] for Heart, Vascular, & Lung Health  Referring Provider Dr. Arnie (Wilbert Bihari, MD)    Initial Encounter Date:  Flowsheet Row INTENSIVE CARDIAC REHAB ORIENT from 05/21/2024 in [facility] for Heart, Vascular, & Lung Health  Date 05/21/24    Visit Diagnosis: Heart transplant recipient [facility])  Patient's Home Medications on Admission:  Current Outpatient Medications:    ACCU-CHEK GUIDE TEST test strip, 1 strip by Other route as needed (Pt has freestyle Seth Ward)., Disp: , Rfl:    acetaminophen  (TYLENOL ) 500 MG tablet, Take 500-1,000 mg by mouth every 6 (six) hours as needed (for pain.)., Disp: , Rfl:    atovaquone (MEPRON) 750 MG/5ML suspension, Take 1,500 mg by mouth daily with breakfast., Disp: , Rfl:    Calcium  Carb-Cholecalciferol 600-10 MG-MCG TABS, Take 1 tablet by mouth., Disp: , Rfl:    Continuous Glucose Sensor (FREESTYLE LIBRE 2 PLUS SENSOR) MISC, 1 kit by Other route daily., Disp: , Rfl:    diltiazem (CARDIZEM CD) 120 MG 24 hr capsule, Take 240 mg by mouth., Disp: , Rfl:    Fingerstix Lancets MISC, 1 each by Other route daily., Disp: , Rfl:    furosemide  (LASIX ) 20 MG tablet, Take 40 mg by mouth daily., Disp: , Rfl:    Insulin  Aspart FlexPen (NOVOLOG ) 100 UNIT/ML, Inject 7 Units into the skin daily., Disp: , Rfl:    magnesium  oxide (MAG-OX) 400 MG tablet, Take 400 mg by mouth daily., Disp: , Rfl:    metFORMIN  (GLUCOPHAGE -XR) 500 MG 24 hr tablet, Take 1,000 mg by mouth 2 (two) times daily with a meal., Disp: , Rfl:    mycophenolate (CELLCEPT) 250 MG capsule, Take 1,500 mg by mouth., Disp: , Rfl:    pantoprazole (PROTONIX) 40 MG tablet, Take 40 mg by mouth., Disp: , Rfl:    predniSONE (DELTASONE) 5 MG tablet, Take 15 mg  by mouth daily., Disp: , Rfl:    rosuvastatin (CRESTOR) 10 MG tablet, Take 10 mg by mouth at bedtime., Disp: , Rfl:    senna-docusate (SENOKOT-S) 8.6-50 MG tablet, Take 1 tablet by mouth 2 (two) times daily., Disp: , Rfl:    tacrolimus  ER (ENVARSUS  XR) 4 MG TB24, Take 20 mg by mouth daily., Disp: , Rfl:    valGANciclovir (VALCYTE) 450 MG tablet, Take 450 mg by mouth daily., Disp: , Rfl:    apixaban  (ELIQUIS ) 5 MG TABS tablet, Take 1 tablet (5 mg total) by mouth 2 (two) times daily., Disp: 60 tablet, Rfl: 11   atorvastatin  (LIPITOR) 10 MG tablet, Take 10 mg by mouth in the morning. (Patient not taking: Reported on 05/21/2024), Disp: , Rfl:    tacrolimus  (PROGRAF ) 5 MG capsule, Take 2 capsules (10 mg total) by mouth every 12 (twelve) hours. (Patient not taking: Reported on 05/21/2024), Disp: 120 capsule, Rfl: 11  Past Medical History: Past Medical History:  Diagnosis Date   Anemia    Arthritis    CHF (congestive heart failure) (HCC) 12/06/2022   EF is 20% to 25 %   COPD (chronic obstructive pulmonary disease) (HCC)    Diabetes mellitus without complication (HCC)    Eczema    GERD (gastroesophageal reflux disease)    History of adenomatous polyp of colon    08-03-2016  tubular adenoma x2 and hyperplastic  History of chronic gastritis 08/03/2016   History of ectopic pregnancy 1988   s/p  left salpingectomy   History of endometriosis    History of kidney stones    History of partial nephrectomy    right pelvis for very large stone   History of pneumonia 07/02/2016   CAP   History of sepsis    07-01-2016  sepsis w/ pyelonephritis, CAP /   12-31-2016  urosepsis w/ kidney stone obstruction   History of uterine leiomyoma    Hx of adenomatous colonic polyps 08/03/2016   2 adenomas, 1 hpp and 1 lost polyp 2018 all < 1 cm    Hyperlipidemia    Hypertension    Left ureteral stone    Myocardial infarction (HCC)    Nephrolithiasis    left obstructive stone and right non-obstructive stone   per CT 12-31-2016   Urgency of urination     Tobacco Use: Social History   Tobacco Use  Smoking Status Former   Current packs/day: 0.00   Average packs/day: 0.8 packs/day for 5.0 years (3.8 ttl pk-yrs)   Types: Cigarettes   Start date: 01/11/1992   Quit date: 01/10/1997   Years since quitting: 27.3  Smokeless Tobacco Never    Labs: Review Flowsheet  More data exists      Latest Ref Rng & Units 10/11/2023 11/18/2023 11/20/2023 11/21/2023 01/29/2024  Labs for ITP Cardiac and Pulmonary Rehab  Bicarbonate 20.0 - 28.0 mmol/L 25.8  25.9  27.2  26.5  - - 26.7  26.1   TCO2 22 - 32 mmol/L 27  27  28  28   - - 28  27   O2 Saturation % 60  60  45  45  53.1  63.2  47  46     Details       Multiple values from one day are sorted in reverse-chronological order         Capillary Blood Glucose: Lab Results  Component Value Date   GLUCAP 105 (H) 01/29/2024   GLUCAP 97 01/29/2024   GLUCAP 102 (H) 10/15/2023   GLUCAP 95 10/15/2023   GLUCAP 123 (H) 10/14/2023     Exercise Target Goals: Exercise Program Goal: Individual exercise prescription set using results from initial 6 min walk test and THRR while considering  patient's activity barriers and safety.   Exercise Prescription Goal: Initial exercise prescription builds to 30-45 minutes a day of aerobic activity, 2-3 days per week.  Home exercise guidelines will be given to patient during program as part of exercise prescription that the participant will acknowledge.  Activity Barriers & Risk Stratification:  Activity Barriers & Cardiac Risk Stratification - 05/21/24 1204       Activity Barriers & Cardiac Risk Stratification   Activity Barriers Back Problems;Neck/Spine Problems;Joint Problems;Deconditioning;Muscular Weakness;Balance Concerns;Assistive Device    Cardiac Risk Stratification High          6 Minute Walk:  6 Minute Walk     Row Name 05/21/24 1352         6 Minute Walk   Phase Initial     Distance 952 feet      Walk Time 6 minutes     # of Rest Breaks 0     MPH 1.8     METS 3.3     RPE 10     Perceived Dyspnea  0     VO2 Peak 11.4     Symptoms No     Resting HR 89 bpm  Resting BP 130/80     Resting Oxygen Saturation  97 %     Exercise Oxygen Saturation  during 6 min walk 98 %     Max Ex. HR 105 bpm     Max Ex. BP 138/80     2 Minute Post BP 134/78        Oxygen Initial Assessment:   Oxygen Re-Evaluation:   Oxygen Discharge (Final Oxygen Re-Evaluation):   Initial Exercise Prescription:  Initial Exercise Prescription - 05/21/24 1300       Date of Initial Exercise RX and Referring Provider   Date 05/21/24    Referring Provider Dr. Arnie Earley Bihari, MD)    Expected Discharge Date 08/12/24      NuStep   Level 1    SPM 75    Minutes 15    METs 3.3      Track   Laps 8    Minutes 15    METs 3.3      Prescription Details   Frequency (times per week) 3    Duration Progress to 30 minutes of continuous aerobic without signs/symptoms of physical distress      Intensity   THRR 40-80% of Max Heartrate 63-126    Ratings of Perceived Exertion 11-13    Perceived Dyspnea 0-4      Progression   Progression Continue progressive overload as per policy without signs/symptoms or physical distress.      Resistance Training   Training Prescription Yes    Weight 2 lbs    Reps 10-15          Perform Capillary Blood Glucose checks as needed.  Exercise Prescription Changes:   Exercise Comments:   Exercise Goals and Review:   Exercise Goals     Row Name 05/21/24 1108             Exercise Goals   Increase Physical Activity Yes       Intervention Develop an individualized exercise prescription for aerobic and resistive training based on initial evaluation findings, risk stratification, comorbidities and participant's personal goals.;Provide advice, education, support and counseling about physical activity/exercise needs.       Expected Outcomes Short Term:  Attend rehab on a regular basis to increase amount of physical activity.;Long Term: Add in home exercise to make exercise part of routine and to increase amount of physical activity.;Long Term: Exercising regularly at least 3-5 days a week.       Increase Strength and Stamina Yes       Intervention Provide advice, education, support and counseling about physical activity/exercise needs.;Develop an individualized exercise prescription for aerobic and resistive training based on initial evaluation findings, risk stratification, comorbidities and participant's personal goals.       Expected Outcomes Short Term: Increase workloads from initial exercise prescription for resistance, speed, and METs.;Short Term: Perform resistance training exercises routinely during rehab and add in resistance training at home;Long Term: Improve cardiorespiratory fitness, muscular endurance and strength as measured by increased METs and functional capacity ( )       Able to understand and use rate of perceived exertion (RPE) scale Yes       Intervention Provide education and explanation on how to use RPE scale       Expected Outcomes Short Term: Able to use RPE daily in rehab to express subjective intensity level;Long Term:  Able to use RPE to guide intensity level when exercising independently       Knowledge and understanding of Target  Heart Rate Range (THRR) Yes       Intervention Provide education and explanation of THRR including how the numbers were predicted and where they are located for reference       Expected Outcomes Short Term: Able to state/look up THRR;Short Term: Able to use daily as guideline for intensity in rehab;Long Term: Able to use THRR to govern intensity when exercising independently       Understanding of Exercise Prescription Yes       Intervention Provide education, explanation, and written materials on patient's individual exercise prescription       Expected Outcomes Short Term: Able to explain  program exercise prescription;Long Term: Able to explain home exercise prescription to exercise independently          Exercise Goals Re-Evaluation :   Discharge Exercise Prescription (Final Exercise Prescription Changes):   Nutrition:  Target Goals: Understanding of nutrition guidelines, daily intake of sodium 1500mg , cholesterol 200mg , calories 30% from fat and 7% or less from saturated fats, daily to have 5 or more servings of fruits and vegetables.  Biometrics:  Pre Biometrics - 05/21/24 1050       Pre Biometrics   Waist Circumference 37.5 inches    Hip Circumference 40 inches    Waist to Hip Ratio 0.94 %    Triceps Skinfold 14 mm    % Body Fat 32.4 %    Grip Strength 18 kg    Flexibility --   pt declined test   Single Leg Stand 4.12 seconds           Nutrition Therapy Plan and Nutrition Goals:   Nutrition Assessments:  MEDIFICTS Score Key: >=70 Need to make dietary changes  40-70 Heart Healthy Diet <= 40 Therapeutic Level Cholesterol Diet   Flowsheet Row CARDIAC REHAB PHASE II EXERCISE from 03/16/2022 in [facility] for Heart, Vascular, & Lung Health  Picture Your Plate Total Score on Discharge 72   Picture Your Plate Scores: <59 Unhealthy dietary pattern with much room for improvement. 41-50 Dietary pattern unlikely to meet recommendations for good health and room for improvement. 51-60 More healthful dietary pattern, with some room for improvement.  >60 Healthy dietary pattern, although there may be some specific behaviors that could be improved.    Nutrition Goals Re-Evaluation:   Nutrition Goals Re-Evaluation:   Nutrition Goals Discharge (Final Nutrition Goals Re-Evaluation):   Psychosocial: Target Goals: Acknowledge presence or absence of significant depression and/or stress, maximize coping skills, provide positive support system. Participant is able to verbalize types and ability to use techniques and skills needed for  reducing stress and depression.  Initial Review & Psychosocial Screening:  Initial Psych Review & Screening - 05/21/24 1116       Initial Review   Current issues with Current Sleep Concerns;Current Stress Concerns;Current Anxiety/Panic    Source of Stress Concerns Chronic Illness    Comments Will continue to monitor and offer support as needed      Family Dynamics   Good Support System? Yes   Pt has her spouse     Barriers   Psychosocial barriers to participate in program The patient should benefit from training in stress management and relaxation.      Screening Interventions   Interventions Encouraged to exercise;Provide feedback about the scores to participant    Expected Outcomes Short Term goal: Identification and review with participant of any Quality of Life or Depression concerns found by scoring the questionnaire.;Long Term Goal: Stressors or current  issues are controlled or eliminated.;Long Term goal: The participant improves quality of Life and PHQ9 Scores as seen by post scores and/or verbalization of changes          Quality of Life Scores:  Quality of Life - 05/21/24 1358       Quality of Life   Select Quality of Life      Quality of Life Scores   Health/Function Pre 24.29 %    Socioeconomic Pre 28.33 %    Psych/Spiritual Pre 28.29 %    Family Pre 27.6 %    GLOBAL Pre 26.44 %         Scores of 19 and below usually indicate a poorer quality of life in these areas.  A difference of  2-3 points is a clinically meaningful difference.  A difference of 2-3 points in the total score of the Quality of Life Index has been associated with significant improvement in overall quality of life, self-image, physical symptoms, and general health in studies assessing change in quality of life.  PHQ-9: Review Flowsheet  More data exists      05/21/2024 03/16/2022 01/11/2022 10/29/2017 09/11/2017  Depression screen PHQ 2/9  Decreased Interest 0 0 0 0 0  Down, Depressed,  Hopeless 0 0 1 0 0  PHQ - 2 Score 0 0 1 0 0  Altered sleeping 2 - 0 - -  Tired, decreased energy 2 - 1 - -  Change in appetite 1 - 0 - -  Feeling bad or failure about yourself  0 - 0 - -  Trouble concentrating 0 - 0 - -  Moving slowly or fidgety/restless 0 - 0 - -  Suicidal thoughts 0 - 0 - -  PHQ-9 Score 5 - 2  - -  Difficult doing work/chores Somewhat difficult - Somewhat difficult - -    Details       Data saved with a previous flowsheet row definition        Interpretation of Total Score  Total Score Depression Severity:  1-4 = Minimal depression, 5-9 = Mild depression, 10-14 = Moderate depression, 15-19 = Moderately severe depression, 20-27 = Severe depression   Psychosocial Evaluation and Intervention:   Psychosocial Re-Evaluation:   Psychosocial Discharge (Final Psychosocial Re-Evaluation):   Vocational Rehabilitation: Provide vocational rehab assistance to qualifying candidates.   Vocational Rehab Evaluation & Intervention:  Vocational Rehab - 05/21/24 1118       Initial Vocational Rehab Evaluation & Intervention   Assessment shows need for Vocational Rehabilitation --   Pt is retired         Education: Education Goals: Education classes will be provided on a weekly basis, covering required topics. Participant will state understanding/return demonstration of topics presented.     Core Videos: Exercise    Move It!  Clinical staff conducted group or individual video education with verbal and written material and guidebook.  Patient learns the recommended Pritikin exercise program. Exercise with the goal of living a long, healthy life. Some of the health benefits of exercise include controlled diabetes, healthier blood pressure levels, improved cholesterol levels, improved heart and lung capacity, improved sleep, and better body composition. Everyone should speak with their doctor before starting or changing an exercise routine.  Biomechanical  Limitations Clinical staff conducted group or individual video education with verbal and written material and guidebook.  Patient learns how biomechanical limitations can impact exercise and how we can mitigate and possibly overcome limitations to have an impactful and balanced exercise  routine.  Body Composition Clinical staff conducted group or individual video education with verbal and written material and guidebook.  Patient learns that body composition (ratio of muscle mass to fat mass) is a key component to assessing overall fitness, rather than body weight alone. Increased fat mass, especially visceral belly fat, can put us  at increased risk for metabolic syndrome, type 2 diabetes, heart disease, and even death. It is recommended to combine diet and exercise (cardiovascular and resistance training) to improve your body composition. Seek guidance from your physician and exercise physiologist before implementing an exercise routine.  Exercise Action Plan Clinical staff conducted group or individual video education with verbal and written material and guidebook.  Patient learns the recommended strategies to achieve and enjoy long-term exercise adherence, including variety, self-motivation, self-efficacy, and positive decision making. Benefits of exercise include fitness, good health, weight management, more energy, better sleep, less stress, and overall well-being.  Medical   Heart Disease Risk Reduction Clinical staff conducted group or individual video education with verbal and written material and guidebook.  Patient learns our heart is our most vital organ as it circulates oxygen, nutrients, white blood cells, and hormones throughout the entire body, and carries waste away. Data supports a plant-based eating plan like the Pritikin Program for its effectiveness in slowing progression of and reversing heart disease. The video provides a number of recommendations to address heart  disease.   Metabolic Syndrome and Belly Fat  Clinical staff conducted group or individual video education with verbal and written material and guidebook.  Patient learns what metabolic syndrome is, how it leads to heart disease, and how one can reverse it and keep it from coming back. You have metabolic syndrome if you have 3 of the following 5 criteria: abdominal obesity, high blood pressure, high triglycerides, low HDL cholesterol, and high blood sugar.  Hypertension and Heart Disease Clinical staff conducted group or individual video education with verbal and written material and guidebook.  Patient learns that high blood pressure, or hypertension, is very common in the [place] . Hypertension is largely due to excessive salt intake, but other important risk factors include being overweight, physical inactivity, drinking too much alcohol, smoking, and not eating enough potassium from fruits and vegetables. High blood pressure is a leading risk factor for heart attack, stroke, congestive heart failure, dementia, kidney failure, and premature death. Long-term effects of excessive salt intake include stiffening of the arteries and thickening of heart muscle and organ damage. Recommendations include ways to reduce hypertension and the risk of heart disease.  Diseases of Our Time - Focusing on Diabetes Clinical staff conducted group or individual video education with verbal and written material and guidebook.  Patient learns why the best way to stop diseases of our time is prevention, through food and other lifestyle changes. Medicine (such as prescription pills and surgeries) is often only a Band-Aid on the problem, not a long-term solution. Most common diseases of our time include obesity, type 2 diabetes, hypertension, heart disease, and cancer. The Pritikin Program is recommended and has been proven to help reduce, reverse, and/or prevent the damaging effects of metabolic syndrome.  Nutrition    Overview of the Pritikin Eating Plan  Clinical staff conducted group or individual video education with verbal and written material and guidebook.  Patient learns about the Pritikin Eating Plan for disease risk reduction. The Pritikin Eating Plan emphasizes a wide variety of unrefined, minimally-processed carbohydrates, like fruits, vegetables, whole grains, and legumes. Go, Caution, and Stop food  choices are explained. Plant-based and lean animal proteins are emphasized. Rationale provided for low sodium intake for blood pressure control, low added sugars for blood sugar stabilization, and low added fats and oils for coronary artery disease risk reduction and weight management.  Calorie Density  Clinical staff conducted group or individual video education with verbal and written material and guidebook.  Patient learns about calorie density and how it impacts the Pritikin Eating Plan. Knowing the characteristics of the food you choose will help you decide whether those foods will lead to weight gain or weight loss, and whether you want to consume more or less of them. Weight loss is usually a side effect of the Pritikin Eating Plan because of its focus on low calorie-dense foods.  Label Reading  Clinical staff conducted group or individual video education with verbal and written material and guidebook.  Patient learns about the Pritikin recommended label reading guidelines and corresponding recommendations regarding calorie density, added sugars, sodium content, and whole grains.  Dining Out - Part 1  Clinical staff conducted group or individual video education with verbal and written material and guidebook.  Patient learns that restaurant meals can be sabotaging because they can be so high in calories, fat, sodium, and/or sugar. Patient learns recommended strategies on how to positively address this and avoid unhealthy pitfalls.  Facts on Fats  Clinical staff conducted group or individual video  education with verbal and written material and guidebook.  Patient learns that lifestyle modifications can be just as effective, if not more so, as many medications for lowering your risk of heart disease. A Pritikin lifestyle can help to reduce your risk of inflammation and atherosclerosis (cholesterol build-up, or plaque, in the artery walls). Lifestyle interventions such as dietary choices and physical activity address the cause of atherosclerosis. A review of the types of fats and their impact on blood cholesterol levels, along with dietary recommendations to reduce fat intake is also included.  Nutrition Action Plan  Clinical staff conducted group or individual video education with verbal and written material and guidebook.  Patient learns how to incorporate Pritikin recommendations into their lifestyle. Recommendations include planning and keeping personal health goals in mind as an important part of their success.  Healthy Mind-Set    Healthy Minds, Bodies, Hearts  Clinical staff conducted group or individual video education with verbal and written material and guidebook.  Patient learns how to identify when they are stressed. Video will discuss the impact of that stress, as well as the many benefits of stress management. Patient will also be introduced to stress management techniques. The way we think, act, and feel has an impact on our hearts.  How Our Thoughts Can Heal Our Hearts  Clinical staff conducted group or individual video education with verbal and written material and guidebook.  Patient learns that negative thoughts can cause depression and anxiety. This can result in negative lifestyle behavior and serious health problems. Cognitive behavioral therapy is an effective method to help control our thoughts in order to change and improve our emotional outlook.  Additional Videos:  Exercise    Improving Performance  Clinical staff conducted group or individual video education with  verbal and written material and guidebook.  Patient learns to use a non-linear approach by alternating intensity levels and lengths of time spent exercising to help burn more calories and lose more body fat. Cardiovascular exercise helps improve heart health, metabolism, hormonal balance, blood sugar control, and recovery from fatigue. Resistance training improves strength, endurance,  balance, coordination, reaction time, metabolism, and muscle mass. Flexibility exercise improves circulation, posture, and balance. Seek guidance from your physician and exercise physiologist before implementing an exercise routine and learn your capabilities and proper form for all exercise.  Introduction to Yoga  Clinical staff conducted group or individual video education with verbal and written material and guidebook.  Patient learns about yoga, a discipline of the coming together of mind, breath, and body. The benefits of yoga include improved flexibility, improved range of motion, better posture and core strength, increased lung function, weight loss, and positive self-image. Yoga's heart health benefits include lowered blood pressure, healthier heart rate, decreased cholesterol and triglyceride levels, improved immune function, and reduced stress. Seek guidance from your physician and exercise physiologist before implementing an exercise routine and learn your capabilities and proper form for all exercise.  Medical   Aging: Enhancing Your Quality of Life  Clinical staff conducted group or individual video education with verbal and written material and guidebook.  Patient learns key strategies and recommendations to stay in good physical health and enhance quality of life, such as prevention strategies, having an advocate, securing a Health Care Proxy and Power of Attorney, and keeping a list of medications and system for tracking them. It also discusses how to avoid risk for bone loss.  Biology of Weight Control   Clinical staff conducted group or individual video education with verbal and written material and guidebook.  Patient learns that weight gain occurs because we consume more calories than we burn (eating more, moving less). Even if your body weight is normal, you may have higher ratios of fat compared to muscle mass. Too much body fat puts you at increased risk for cardiovascular disease, heart attack, stroke, type 2 diabetes, and obesity-related cancers. In addition to exercise, following the Pritikin Eating Plan can help reduce your risk.  Decoding Lab Results  Clinical staff conducted group or individual video education with verbal and written material and guidebook.  Patient learns that lab test reflects one measurement whose values change over time and are influenced by many factors, including medication, stress, sleep, exercise, food, hydration, pre-existing medical conditions, and more. It is recommended to use the knowledge from this video to become more involved with your lab results and evaluate your numbers to speak with your doctor.   Diseases of Our Time - Overview  Clinical staff conducted group or individual video education with verbal and written material and guidebook.  Patient learns that according to the CDC, 50% to 70% of chronic diseases (such as obesity, type 2 diabetes, elevated lipids, hypertension, and heart disease) are avoidable through lifestyle improvements including healthier food choices, listening to satiety cues, and increased physical activity.  Sleep Disorders Clinical staff conducted group or individual video education with verbal and written material and guidebook.  Patient learns how good quality and duration of sleep are important to overall health and well-being. Patient also learns about sleep disorders and how they impact health along with recommendations to address them, including discussing with a physician.  Nutrition  Dining Out - Part 2 Clinical staff  conducted group or individual video education with verbal and written material and guidebook.  Patient learns how to plan ahead and communicate in order to maximize their dining experience in a healthy and nutritious manner. Included are recommended food choices based on the type of restaurant the patient is visiting.   Fueling a Banker conducted group or individual video education with verbal and  written material and guidebook.  There is a strong connection between our food choices and our health. Diseases like obesity and type 2 diabetes are very prevalent and are in large-part due to lifestyle choices. The Pritikin Eating Plan provides plenty of food and hunger-curbing satisfaction. It is easy to follow, affordable, and helps reduce health risks.  Menu Workshop  Clinical staff conducted group or individual video education with verbal and written material and guidebook.  Patient learns that restaurant meals can sabotage health goals because they are often packed with calories, fat, sodium, and sugar. Recommendations include strategies to plan ahead and to communicate with the manager, chef, or server to help order a healthier meal.  Planning Your Eating Strategy  Clinical staff conducted group or individual video education with verbal and written material and guidebook.  Patient learns about the Pritikin Eating Plan and its benefit of reducing the risk of disease. The Pritikin Eating Plan does not focus on calories. Instead, it emphasizes high-quality, nutrient-rich foods. By knowing the characteristics of the foods, we choose, we can determine their calorie density and make informed decisions.  Targeting Your Nutrition Priorities  Clinical staff conducted group or individual video education with verbal and written material and guidebook.  Patient learns that lifestyle habits have a tremendous impact on disease risk and progression. This video provides eating and physical  activity recommendations based on your personal health goals, such as reducing LDL cholesterol, losing weight, preventing or controlling type 2 diabetes, and reducing high blood pressure.  Vitamins and Minerals  Clinical staff conducted group or individual video education with verbal and written material and guidebook.  Patient learns different ways to obtain key vitamins and minerals, including through a recommended healthy diet. It is important to discuss all supplements you take with your doctor.   Healthy Mind-Set    Smoking Cessation  Clinical staff conducted group or individual video education with verbal and written material and guidebook.  Patient learns that cigarette smoking and tobacco addiction pose a serious health risk which affects millions of people. Stopping smoking will significantly reduce the risk of heart disease, lung disease, and many forms of cancer. Recommended strategies for quitting are covered, including working with your doctor to develop a successful plan.  Culinary   Becoming a Set Designer conducted group or individual video education with verbal and written material and guidebook.  Patient learns that cooking at home can be healthy, cost-effective, quick, and puts them in control. Keys to cooking healthy recipes will include looking at your recipe, assessing your equipment needs, planning ahead, making it simple, choosing cost-effective seasonal ingredients, and limiting the use of added fats, salts, and sugars.  Cooking - Breakfast and Snacks  Clinical staff conducted group or individual video education with verbal and written material and guidebook.  Patient learns how important breakfast is to satiety and nutrition through the entire day. Recommendations include key foods to eat during breakfast to help stabilize blood sugar levels and to prevent overeating at meals later in the day. Planning ahead is also a key component.  Cooking - Psychologist, Educational conducted group or individual video education with verbal and written material and guidebook.  Patient learns eating strategies to improve overall health, including an approach to cook more at home. Recommendations include thinking of animal protein as a side on your plate rather than center stage and focusing instead on lower calorie dense options like vegetables, fruits, whole grains, and plant-based proteins,  such as beans. Making sauces in large quantities to freeze for later and leaving the skin on your vegetables are also recommended to maximize your experience.  Cooking - Healthy Salads and Dressing Clinical staff conducted group or individual video education with verbal and written material and guidebook.  Patient learns that vegetables, fruits, whole grains, and legumes are the foundations of the Pritikin Eating Plan. Recommendations include how to incorporate each of these in flavorful and healthy salads, and how to create homemade salad dressings. Proper handling of ingredients is also covered. Cooking - Soups and [employer] - Soups and Desserts Clinical staff conducted group or individual video education with verbal and written material and guidebook.  Patient learns that Pritikin soups and desserts make for easy, nutritious, and delicious snacks and meal components that are low in sodium, fat, sugar, and calorie density, while high in vitamins, minerals, and filling fiber. Recommendations include simple and healthy ideas for soups and desserts.   Overview     The Pritikin Solution Program Overview Clinical staff conducted group or individual video education with verbal and written material and guidebook.  Patient learns that the results of the Pritikin Program have been documented in more than 100 articles published in peer-reviewed journals, and the benefits include reducing risk factors for (and, in some cases, even reversing) high cholesterol, high  blood pressure, type 2 diabetes, obesity, and more! An overview of the three key pillars of the Pritikin Program will be covered: eating well, doing regular exercise, and having a healthy mind-set.  WORKSHOPS  Exercise: Exercise Basics: Building Your Action Plan Clinical staff led group instruction and group discussion with PowerPoint presentation and patient guidebook. To enhance the learning environment the use of posters, models and videos may be added. At the conclusion of this workshop, patients will comprehend the difference between physical activity and exercise, as well as the benefits of incorporating both, into their routine. Patients will understand the FITT (Frequency, Intensity, Time, and Type) principle and how to use it to build an exercise action plan. In addition, safety concerns and other considerations for exercise and cardiac rehab will be addressed by the presenter. The purpose of this lesson is to promote a comprehensive and effective weekly exercise routine in order to improve patients' overall level of fitness.   Managing Heart Disease: Your Path to a Healthier Heart Clinical staff led group instruction and group discussion with PowerPoint presentation and patient guidebook. To enhance the learning environment the use of posters, models and videos may be added.At the conclusion of this workshop, patients will understand the anatomy and physiology of the heart. Additionally, they will understand how Pritikin's three pillars impact the risk factors, the progression, and the management of heart disease.  The purpose of this lesson is to provide a high-level overview of the heart, heart disease, and how the Pritikin lifestyle positively impacts risk factors.  Exercise Biomechanics Clinical staff led group instruction and group discussion with PowerPoint presentation and patient guidebook. To enhance the learning environment the use of posters, models and videos may be added.  Patients will learn how the structural parts of their bodies function and how these functions impact their daily activities, movement, and exercise. Patients will learn how to promote a neutral spine, learn how to manage pain, and identify ways to improve their physical movement in order to promote healthy living. The purpose of this lesson is to expose patients to common physical limitations that impact physical activity. Participants will learn practical ways  to adapt and manage aches and pains, and to minimize their effect on regular exercise. Patients will learn how to maintain good posture while sitting, walking, and lifting.  Balance Training and Fall Prevention  Clinical staff led group instruction and group discussion with PowerPoint presentation and patient guidebook. To enhance the learning environment the use of posters, models and videos may be added. At the conclusion of this workshop, patients will understand the importance of their sensorimotor skills (vision, proprioception, and the vestibular system) in maintaining their ability to balance as they age. Patients will apply a variety of balancing exercises that are appropriate for their current level of function. Patients will understand the common causes for poor balance, possible solutions to these problems, and ways to modify their physical environment in order to minimize their fall risk. The purpose of this lesson is to teach patients about the importance of maintaining balance as they age and ways to minimize their risk of falling.  WORKSHOPS   Nutrition:  Fueling a Ship Broker led group instruction and group discussion with PowerPoint presentation and patient guidebook. To enhance the learning environment the use of posters, models and videos may be added. Patients will review the foundational principles of the Pritikin Eating Plan and understand what constitutes a serving size in each of the food groups.  Patients will also learn Pritikin-friendly foods that are better choices when away from home and review make-ahead meal and snack options. Calorie density will be reviewed and applied to three nutrition priorities: weight maintenance, weight loss, and weight gain. The purpose of this lesson is to reinforce (in a group setting) the key concepts around what patients are recommended to eat and how to apply these guidelines when away from home by planning and selecting Pritikin-friendly options. Patients will understand how calorie density may be adjusted for different weight management goals.  Mindful Eating  Clinical staff led group instruction and group discussion with PowerPoint presentation and patient guidebook. To enhance the learning environment the use of posters, models and videos may be added. Patients will briefly review the concepts of the Pritikin Eating Plan and the importance of low-calorie dense foods. The concept of mindful eating will be introduced as well as the importance of paying attention to internal hunger signals. Triggers for non-hunger eating and techniques for dealing with triggers will be explored. The purpose of this lesson is to provide patients with the opportunity to review the basic principles of the Pritikin Eating Plan, discuss the value of eating mindfully and how to measure internal cues of hunger and fullness using the Hunger Scale. Patients will also discuss reasons for non-hunger eating and learn strategies to use for controlling emotional eating.  Targeting Your Nutrition Priorities Clinical staff led group instruction and group discussion with PowerPoint presentation and patient guidebook. To enhance the learning environment the use of posters, models and videos may be added. Patients will learn how to determine their genetic susceptibility to disease by reviewing their family history. Patients will gain insight into the importance of diet as part of an overall healthy  lifestyle in mitigating the impact of genetics and other environmental insults. The purpose of this lesson is to provide patients with the opportunity to assess their personal nutrition priorities by looking at their family history, their own health history and current risk factors. Patients will also be able to discuss ways of prioritizing and modifying the Pritikin Eating Plan for their highest risk areas  Menu  Clinical staff led group  instruction and group discussion with PowerPoint presentation and patient guidebook. To enhance the learning environment the use of posters, models and videos may be added. Using menus brought in from [employer], or printed from [employer], patients will apply the Pritikin dining out guidelines that were presented in the [employer] video. Patients will also be able to practice these guidelines in a variety of provided scenarios. The purpose of this lesson is to provide patients with the opportunity to practice hands-on learning of the Pritikin Dining Out guidelines with actual menus and practice scenarios.  Label Reading Clinical staff led group instruction and group discussion with PowerPoint presentation and patient guidebook. To enhance the learning environment the use of posters, models and videos may be added. Patients will review and discuss the Pritikin label reading guidelines presented in Pritikin's Label Reading Educational series video. Using fool labels brought in from local grocery stores and markets, patients will apply the label reading guidelines and determine if the packaged food meet the Pritikin guidelines. The purpose of this lesson is to provide patients with the opportunity to review, discuss, and practice hands-on learning of the Pritikin Label Reading guidelines with actual packaged food labels. Cooking School  Pritikin's [employer] are designed to teach patients ways to prepare quick, simple, and  affordable recipes at home. The importance of nutrition's role in chronic disease risk reduction is reflected in its emphasis in the overall Pritikin program. By learning how to prepare essential core Pritikin Eating Plan recipes, patients will increase control over what they eat; be able to customize the flavor of foods without the use of added salt, sugar, or fat; and improve the quality of the food they consume. By learning a set of core recipes which are easily assembled, quickly prepared, and affordable, patients are more likely to prepare more healthy foods at home. These workshops focus on convenient breakfasts, simple entres, side dishes, and desserts which can be prepared with minimal effort and are consistent with nutrition recommendations for cardiovascular risk reduction. Cooking [employer] are taught by a armed forces logistics/support/administrative officer (RD) who has been trained by the [employer]. The chef or RD has a clear understanding of the importance of minimizing - if not completely eliminating - added fat, sugar, and sodium in recipes. Throughout the series of Cooking School Workshop sessions, patients will learn about healthy ingredients and efficient methods of cooking to build confidence in their capability to prepare    Cooking School weekly topics:  Adding Flavor- Sodium-Free  Fast and Healthy Breakfasts  Powerhouse Plant-Based Proteins  Satisfying Salads and Dressings  Simple Sides and Sauces  International Cuisine-Spotlight on the [employer] Zones  Delicious Desserts  Savory Soups  [employer] - Meals in a Astronomer Appetizers and Snacks  Comforting Weekend Breakfasts  One-Pot Wonders   Fast Evening Meals  Landscape Architect Your Pritikin Plate  WORKSHOPS   Healthy Mindset (Psychosocial):  Focused Goals, Sustainable Changes Clinical staff led group instruction and group discussion with PowerPoint presentation and patient guidebook. To enhance  the learning environment the use of posters, models and videos may be added. Patients will be able to apply effective goal setting strategies to establish at least one personal goal, and then take consistent, meaningful action toward that goal. They will learn to identify common barriers to achieving personal goals and develop strategies to overcome them. Patients will also gain an understanding of how our mind-set can impact our ability to  achieve goals and the importance of cultivating a positive and growth-oriented mind-set. The purpose of this lesson is to provide patients with a deeper understanding of how to set and achieve personal goals, as well as the tools and strategies needed to overcome common obstacles which may arise along the way.  From Head to Heart: The Power of a Healthy Outlook  Clinical staff led group instruction and group discussion with PowerPoint presentation and patient guidebook. To enhance the learning environment the use of posters, models and videos may be added. Patients will be able to recognize and describe the impact of emotions and mood on physical health. They will discover the importance of self-care and explore self-care practices which may work for them. Patients will also learn how to utilize the 4 C's to cultivate a healthier outlook and better manage stress and challenges. The purpose of this lesson is to demonstrate to patients how a healthy outlook is an essential part of maintaining good health, especially as they continue their cardiac rehab journey.  Healthy Sleep for a Healthy Heart Clinical staff led group instruction and group discussion with PowerPoint presentation and patient guidebook. To enhance the learning environment the use of posters, models and videos may be added. At the conclusion of this workshop, patients will be able to demonstrate knowledge of the importance of sleep to overall health, well-being, and quality of life. They will understand the  symptoms of, and treatments for, common sleep disorders. Patients will also be able to identify daytime and nighttime behaviors which impact sleep, and they will be able to apply these tools to help manage sleep-related challenges. The purpose of this lesson is to provide patients with a general overview of sleep and outline the importance of quality sleep. Patients will learn about a few of the most common sleep disorders. Patients will also be introduced to the concept of "sleep hygiene," and discover ways to self-manage certain sleeping problems through simple daily behavior changes. Finally, the workshop will motivate patients by clarifying the links between quality sleep and their goals of heart-healthy living.   Recognizing and Reducing Stress Clinical staff led group instruction and group discussion with PowerPoint presentation and patient guidebook. To enhance the learning environment the use of posters, models and videos may be added. At the conclusion of this workshop, patients will be able to understand the types of stress reactions, differentiate between acute and chronic stress, and recognize the impact that chronic stress has on their health. They will also be able to apply different coping mechanisms, such as reframing negative self-talk. Patients will have the opportunity to practice a variety of stress management techniques, such as deep abdominal breathing, progressive muscle relaxation, and/or guided imagery.  The purpose of this lesson is to educate patients on the role of stress in their lives and to provide healthy techniques for coping with it.  Learning Barriers/Preferences:  Learning Barriers/Preferences - 05/21/24 1118       Learning Barriers/Preferences   Learning Barriers Sight    Learning Preferences Written Material;Audio;Computer/Internet;Group Instruction;Individual Instruction;Pictoral;Skilled Demonstration;Verbal Instruction;Video          Education  Topics:  Knowledge Questionnaire Score:  Knowledge Questionnaire Score - 05/21/24 1124       Knowledge Questionnaire Score   Pre Score 21/24          Core Components/Risk Factors/Patient Goals at Admission:  Personal Goals and Risk Factors at Admission - 05/21/24 1118       Core Components/Risk Factors/Patient Goals on Admission  Diabetes Yes    Intervention Provide education about signs/symptoms and action to take for hypo/hyperglycemia.;Provide education about proper nutrition, including hydration, and aerobic/resistive exercise prescription along with prescribed medications to achieve blood glucose in normal ranges: Fasting glucose 65-99 mg/dL    Expected Outcomes Short Term: Participant verbalizes understanding of the signs/symptoms and immediate care of hyper/hypoglycemia, proper foot care and importance of medication, aerobic/resistive exercise and nutrition plan for blood glucose control.;Long Term: Attainment of HbA1C < 7%.    Hypertension Yes    Intervention Provide education on lifestyle modifcations including regular physical activity/exercise, weight management, moderate sodium restriction and increased consumption of fresh fruit, vegetables, and low fat dairy, alcohol moderation, and smoking cessation.;Monitor prescription use compliance.    Expected Outcomes Short Term: Continued assessment and intervention until BP is < 140/34mm HG in hypertensive participants. < 130/62mm HG in hypertensive participants with diabetes, heart failure or chronic kidney disease.;Long Term: Maintenance of blood pressure at goal levels.    Lipids Yes    Intervention Provide education and support for participant on nutrition & aerobic/resistive exercise along with prescribed medications to achieve LDL 70mg , HDL >40mg .    Expected Outcomes Long Term: Cholesterol controlled with medications as prescribed, with individualized exercise RX and with personalized nutrition plan. Value goals: LDL < 70mg ,  HDL > 40 mg.;Short Term: Participant states understanding of desired cholesterol values and is compliant with medications prescribed. Participant is following exercise prescription and nutrition guidelines.    Stress Yes    Intervention Offer individual and/or small group education and counseling on adjustment to heart disease, stress management and health-related lifestyle change. Teach and support self-help strategies.;Refer participants experiencing significant psychosocial distress to appropriate mental health specialists for further evaluation and treatment. When possible, include family members and significant others in education/counseling sessions.    Expected Outcomes Short Term: Participant demonstrates changes in health-related behavior, relaxation and other stress management skills, ability to obtain effective social support, and compliance with psychotropic medications if prescribed.;Long Term: Emotional wellbeing is indicated by absence of clinically significant psychosocial distress or social isolation.          Core Components/Risk Factors/Patient Goals Review:    Core Components/Risk Factors/Patient Goals at Discharge (Final Review):    ITP Comments:  ITP Comments     Row Name 05/21/24 1106           ITP Comments Wilbert Bihari, MD: Medical Director.  Introduction to the [facility] program/Intensive cardiac rehab. Initial orientation packet reviewed with the patient.          Comments: Participant attended orientation for the cardiac rehabilitation program on  05/21/2024  to perform initial intake and exercise walk test. Patient introduced to the [facility] education and orientation packet was reviewed. Completed 6-minute walk test, measurements, initial ITP, and exercise prescription. Vital signs stable. Telemetry-normal sinus rhythm, asymptomatic.   Service time was from 10:30 to 12:55.

## 2024-05-21 NOTE — Telephone Encounter (Signed)
-  patient returned call Reports she is currently admitted at [facility], unable to report for add on 11/21

## 2024-05-21 NOTE — Progress Notes (Signed)
 Cardiac Rehab Medication Review   Does the patient  feel that his/her medications are working for him/her?  yes  Has the patient been experiencing any side effects to the medications prescribed?  yes  Does the patient measure his/her own blood pressure or blood glucose at home?  yes   Does the patient have any problems obtaining medications due to transportation or finances?   no  Understanding of regimen: good Understanding of indications: good Potential of compliance: good    Comments: Pt voices side include loss of appetite, lost the taste for food, Dry mouth, blurry vision (pt voices needs new glasses)    Lynn Estrada 05/21/2024 11:49 AM

## 2024-05-25 ENCOUNTER — Telehealth (HOSPITAL_COMMUNITY): Payer: Self-pay

## 2024-05-25 ENCOUNTER — Encounter (HOSPITAL_COMMUNITY): Admission: RE | Admit: 2024-05-25 | Source: Ambulatory Visit

## 2024-05-25 NOTE — Telephone Encounter (Signed)
 Patient left message calling out for 1st day of 10:15 CR class, no reason given.

## 2024-05-26 NOTE — Progress Notes (Signed)
 Fredrick Camba seen in cardiac transplant clinic on May 26, 2024 by Charmaine Salt, NP.  She is cleared to undergo endomyocardial biopsy (3+1) to assess for presence and severity of rejection, right heart catheterization to assess volume status, right heart cath to reassess pulmonary artery pressures Echocardiogram to be done in [facility].  Apixaban  held 11/23 per patient.   Charmaine Salt, NP  [facility]

## 2024-05-26 NOTE — Progress Notes (Addendum)
 Cardiac Catheterization Pre-Procedure  I have reviewed the medical and surgical history, family history, review of systems, current medications, allergies and sensitivities, physical examination, laboratory and diagnostic data.   Lynn Estrada is a 63 y.o. female with a PMHX of NICM s/p OHT 02/13/2024, significant rejection history 02/21/2024 2R, pAMR 2 (low FK 5.5, HD stable, normal graft, pulse steroid) and 2R rejection again 9/10 (normal EF) s/p pred burst, HTN, dyslipidemia, T2DM, RIJ thrombus (Eliquis ) who presents from txp clinic for endomyocardial biopsy (3+1) to assess for presence and severity of rejection, right heart catheterization to assess volume status, right heart cath to reassess pulmonary artery pressures.  Echocardiogram to be done in [facility].   Last dose Eliquis  11/23 AM  Labs: Recent Labs  Lab 05/26/24 0803  NA 140  K 3.7  CL 99  CO2 25  BUN 28*  CREATININE 1.3*  GLUCOSE 119  CALCIUM  9.0  MG 1.7*   Recent Labs  Lab 05/26/24 0803  WBC 5.5  HGB 8.9*  HCT 29.9*  PLT 256    Latest Reference Range & Units 04/12/24 11:41  Prothrombin INR 0.9 - 1.1  0.9   No results for input(s): APTT, INR in the last 168 hours.  EKG: SR Plan for procedure: cardiac catheterization, endomyocardial biopsy (3+1), RHC Alternative options: no catheterization The procedures are indicated to evaluate and/or treat (diagnosis): s/p OHT  Plan for moderate sedation: Moderate sedation indicated to provide moderate sedation and analgesia during the invasive procedure. Adverse experiences with sedation / analgesia reviewed. The patient is an appropriate candidate for moderate sedation. Planned sedation may include: Diphenhydramine  (Benadryl ), Midazolam  (Versed ), Hydromorphone (Dilaudid) and Fentanyl  (Sublimaze )  ASA Classification: I - Healthy  Mallampati Classification: deferred  Plan for blood product transfusion: transfusion not anticipated  PHYSICAL EXAM: BP 132/89   Pulse  100   Temp 36.8 C (98.3 F) (Oral)   Resp 15   Ht 188 cm (6' 2.02)   Wt 82.3 kg (181 lb 7 oz)   SpO2 99%   BMI 23.29 kg/m  Body mass index is 23.29 kg/m. General appearance: alert, cooperative, pleasant, in no acute distress Head: Normocephalic, atraumatic Eyes: conjunctiva/corneas normal, PERRL Oropharynx: moist without lesions Neck: supple and no JVD Heart: regular rate and rhythm, without murmur, S3 present Lungs: clear to auscultation, without rales or wheeze, good air exchange Abdomen: soft, nondistended, nontender Ext: trace nonpitting edema in LE bilaterally, 2+ femorals no bruit, 2+ radials, 1+ DP/PTs  The patient was interviewed and examined, and the available chart reviewed.  The indications, procedure, risk, potential complications (as listed on the respective consent forms), and alternatives for both the procedure and for moderate sedation were explained and discussed.  The patient understands and has signed the informed consent for the procedure and the informed consent for moderate sedation.  Plan for post-procedure care needs: Outpatient holding  MICHELLE CASTILE MARTWICK, NP 05/26/2024

## 2024-05-27 ENCOUNTER — Telehealth (HOSPITAL_COMMUNITY): Payer: Self-pay | Admitting: *Deleted

## 2024-05-27 ENCOUNTER — Encounter (HOSPITAL_COMMUNITY)

## 2024-05-27 NOTE — Care Plan (Signed)
  Problem: Fluid Volume Imbalance: Goal: Impaired intake and output balance   will improve Outcome: Progressing

## 2024-05-27 NOTE — Progress Notes (Signed)
 Pt arrived from Cath Lab following a R femoral access for the purpose of a specimen biopsy and assessing volume status d/t complaints of fatigue. No complaints of pain, in no acute distress. Access site clean/dry/intact, soft and non-tender.

## 2024-05-27 NOTE — Telephone Encounter (Signed)
 Lynn Estrada left message on department voicemail this morning. She was admitted to the hospital and will be unable to attend cardiac rehab today or the rest of the week. She will call when she will return. Will forward to her nurse case manager for follow-up/ clearance to return.

## 2024-05-28 NOTE — Discharge Summary (Signed)
 [facility]                 Transplant Cardiology Discharge Summary   Admit Date: 05/26/2024  Discharge Date: 06/02/2024  Admitting Physician: Samuel Cahill, MD  Discharge Physician: Antonieta Darby   Primary Care Provider: Juliane Bernita Peel, PA  Discharge Destination: Home  Discharge Services: none  Code Status: Full code    Admission Diagnoses:  Heart replaced by transplant (CMS/HHS-HCC) [Z94.1]  Discharge Diagnoses:  Principal Problem:   Heart transplant rejection (CMS/HHS-HCC) Active Problems:   DM2 (diabetes mellitus, type 2) (CMS/HHS-HCC)   S/P orthotopic heart transplant: 02/13/2024   Acute antibody mediated rejection of graft   Immunosuppressed status (HHS-HCC)   CKD stage 3b, GFR 30-44 ml/min (CMS-HCC) Resolved Problems:   AKI (acute kidney injury)     Presented with cellular rejection in setting of FK level <2. S/p IV solumedrol x3 (500mg  and x2 1g). Increased envarsus  to 24mg . Continued diltiazem booster.   - Repeat FK level 12/4, would increase by 4mg  if not within goal. - Resumed IVIG outpatient (1 session left) - Kayla (pharmacist) reviewed pill box and call [facility] to confirm new envarsus  RX and cancel prograf  prescription.  - Repeat EMBx 3+1 and TTE in 2 weeks   OHT: 02/13/24 by Arnie CMV: D+/R+ Echo: 11/25 EF 45% RHC: 11/25 RA 9, PCW 26, CI 3 EMB: 11/25 2R, pAMR 1 H+ Last HLAs 05/07/24 0/0 FK goal: 8-10 Current regimen: envarsus  XR, Cellcept, and Prednisone. On dilt booster  Infectious PPx: Atovaquone, CMV: Valganciclovir General PPx: ASA, Ca, and statin PLEX/RATG Hx: yes- PLEX 04/2024    Cardiac Rehab: not indicated   Patient Discharge Instructions:   Ambulatory Referral to Centralized Social Support/[facility] [phone])  Referral Priority: Routine Referral Type: Consultation  Number of Visits Requested: 1     [facility] Provider Follow-up: Future Appointments  Date Time Provider  Department Center  06/08/2024  3:30 PM Ridenhour, Alan Massa, NP [facility] [place]  06/30/2024  1:00 PM Lelle-Michel, Inocente, CNS [facility]    Non-[facility] Provider Follow-up: none  Report Issues: By using [facility], or by calling the Heart Transplant office at [phone]. For urgent issues or after business hours (after 5pm on weekdays and anytime on weekends), call the [facility] Operator [phone] and ask to page the on-call transplant cardiologist.     Allergies/Intolerances:  Allergies  Allergen Reactions  . Bactrim [Sulfamethoxazole -Trimethoprim] Itching and Rash  . Carvedilol  Itching  . Latex, Natural Rubber Rash  . Metoprolol  Itching     Medications:     Discharge Medications     Modified Medications      Details  metFORMIN  500 MG XR tablet Commonly known as: GLUCOPHAGE -XR What changed: how much to take  500 mg, Oral, 2 times Daily with meals Quantity: 120 tablet Refills: 11   tacrolimus  4 mg extended-release tablet Commonly known as: ENVARSUS  XR What changed: how much to take  24 mg, Oral, Daily Quantity: 120 tablet Refills: 0       Medications To Continue      Details  apixaban  5 mg tablet Commonly known as: ELIQUIS   5 mg, Oral, Every 12 hours Quantity: 60 tablet Refills: 2   atovaquone 750 mg/5 mL suspension Commonly known as: MEPRON  1,500 mg, Oral, Daily with breakfast Quantity: 300 mL Refills: 11  blood glucose diagnostic test strip  1 strip, XX, 4 times Daily, Use as instructed. Quantity: 360 each Refills: 0   calcium  carbonate-vitamin D3 600 mg-10 mcg (400 unit) tablet Commonly known as: CALTRATE 600+D  1 tablet, Oral, 2 times Daily at lunch and dinner Quantity: 60 tablet Refills: 11   CEQUR SIMPLICITY 2 unit Devi Generic drug: bolus insulin  pump, 200 unit  1 each, Misc.(Non-Drug; Combo Route), As Directed, Change every 4 days. Up to 40 units.  90 day supply, 24 patches, 3 boxes Quantity: 3  each Refills: 4   CEQUR SIMPLICITY INSERTER Misc Generic drug: diabetic supplies, miscellan.  1 each, Misc.(Non-Drug; Combo Route), As Directed, Change every 4 days Quantity: 1 each Refills: 0   dilTIAZem 120 MG XR capsule Commonly known as: CARDIZEM CD  240 mg, Oral, Daily Quantity: 60 capsule Refills: 11   FREESTYLE LIBRE 2 SENSOR Kit  1 kit, Misc.(Non-Drug; Combo Route), Every 14 days Quantity: 6 each Refills: 0   FUROsemide  20 MG tablet Commonly known as: LASIX   40 mg, Oral, Daily Quantity: 60 tablet Refills: 11   * insulin  ASPART pen injector (concentration 100 units/mL) Commonly known as: NovoLOG  Flexpen U-100 Insulin   7 Units, Subcutaneous, 3 times Daily CC (with meals) (INSULIN ), Plus correction 1/50>201. Up to 40 units daily Quantity: 15 mL Refills: 11   * insulin  ASPART injection (concentration 100 units/mL) Commonly known as: NovoLOG   For use with CEQUR insulin  patch. Up to 40 units daily. Quantity: 40 mL Refills: 4   lancets  1 each, Misc.(Non-Drug; Combo Route), 3 times Daily, Use as instructed. Quantity: 400 each Refills: 2   magnesium  oxide 400 mg (241.3 mg magnesium ) tablet Commonly known as: MAG-OX  400 mg, Oral, 2 times Daily at lunch and dinner Quantity: 60 tablet Refills: 11   mycophenolate 250 mg capsule Commonly known as: CELLCEPT  1,500 mg, Oral, Every 12 hours Quantity: 360 capsule Refills: 11   pantoprazole 40 MG DR tablet Commonly known as: PROTONIX  40 mg, Oral, Daily Quantity: 30 tablet Refills: 11   predniSONE 5 MG tablet Commonly known as: DELTASONE  15 mg, Oral, Daily Quantity: 90 tablet Refills: 11   rosuvastatin 10 MG tablet Commonly known as: CRESTOR  10 mg, Oral, Daily Quantity: 30 tablet Refills: 11   sennosides-docusate 8.6-50 mg tablet Commonly known as: SENOKOT-S  Take 1-2 tablets twice daily as needed to prevent constipation Quantity: 30 tablet Refills: 2   valGANciclovir 450 mg tablet Commonly  known as: VALCYTE  450 mg, Oral, Every evening Quantity: 30 tablet Refills: 8      * * There are duplicate medications prescribed to the patient             Brief History of Present Illness: Per the H&P dated on 05/26/2024:  Lynn Estrada is a 63 y.o. 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    Patient reported increased fatigue and decreased exercise tolerance for the past week. Was recently diagnosed with a sinus infection and completed a course of antibiotics yesterday. Sinus pain has improved but continues to have a productive cough with green sputum. Denies shortness of breath, chest pain, palpitations, fevers/chills, abdominal pain. Has been having nausea for a couple  of days but no vomiting. Dizziness with exertion, no loss of consciousness. Some lower extremity edema. Weighs herself regularly with weights 178-180 lbs. Takes daily diuretic.   Reports taking her medications but found out in clinic today that she had been taking her tacrolimus  wrong. She reports taking 20 mg Envarsus  and did not know that she was supposed to stop prior tacrolimus  IR 1 mg so she was taking both formulations.    Labwork today notable for FK <2.0. Cr 1.3 (baseline). Hgb 7.9 (baseline 8s). Routine TTE in clinic with concern for reduction in EF 35-40%. Presented to cath lab for RHC which showed RA 9, PCW 26, CI 3. EMBx 3+1 collected. Admitted for management of possible recurrent rejection. _____________________  Hospital Course by Problem:  # ACR  # NICM s/p OHT 02/13/24 # Hx recurrent ACR/AMR (s/p multiple steroid bursts and most recently PLEX x4 and IVIG 04/09/24, now on monthly IVIG) # Subtherapeutic  FK Presented with fatigue and exercise intolerance concerning for recurrent rejection given TTE with reduced function EF 35-40% and subtherapeutic FK <2.0. Of note, has not been within goal since early October and concern for medication challenges at home. RHC 11/25 RA 9, PCW 26, CI 3. Last HLA 11/6 0/0, AT1R negative. EMBx 11/25 2R, pAMR 1 H+. S/p 3 doses of IV solumedrol (500mg  x1 then 1g x2). Continue prednisone 15mg , mycophenolate 1500 BID, and maintenance PO lasix  40mg  qd. Discharged on Envarsus  24 mg. FK level 5.6 12/2 and rising. Repeat FK level 12/4, would increase by 4mg  if not within goal. Continue diltiazem booster.   # Prophylaxis Continue atovaquone for PJP ppx. Allergy with Bactrim (itching and rash). CMV intermediate risk but plan to continue valganciclovir for 12 months given high dose immunosuppression. Continue aspirin , rosuvastatin, and Ca-Vit D3.   # T2DM # Hyperglycemia  A1c 8 04/18/2024. On metformin  and insulin  aspart 7U with meals plus correction. Persistent hyperglycemia ISO steroid dosing. Endocrine followed. Continued home insulin  regime. Changed metformin  XL to 500mg  BID   # AKI # CKD stage 3b # Diarrhea, resolved Baseline sCr 1.3-1.6. Peak scr 2.9 in setting of diarrhea s/p 1L IVF 11/28. Diarrhea resolved. Scr improved to 1.4 at discharge.    # RIJ thrombus Continue apixaban     # Anemia Baseline Hgb 8s. Denies signs or symptoms of bleeding. Iron  studies, folate, and VB12 levels normal.      Social Drivers of Health with Concerns   Tobacco Use: Medium Risk (05/26/2024)   Patient History   . Smoking Tobacco Use: Former   . Smokeless Tobacco Use: Unknown  Financial Resource Strain: Medium Risk (05/27/2024)   Overall Financial Resource Strain (CARDIA)   . Difficulty of Paying Living Expenses: Somewhat hard  Food Insecurity: Food Insecurity Present (05/27/2024)   Hunger Vital Sign   . Worried About Programme Researcher, Broadcasting/film/video in the Last Year: Often true   . Ran Out  of Food in the Last Year: Often true  Physical Activity: Unknown (09/24/2023)   [facility]   Exercise Vital Sign   . On average, how many days per week do you engage in moderate to strenuous exercise (like a brisk walk)?: 0 days  Housing Stability: High Risk (05/27/2024)   Housing Stability Vital Sign   . Unable to Pay for Housing in the Last Year: Yes   . Number of Times Moved in the Last Year: 0   . Homeless in the Last Year: No   Comorbid Conditions: Electrolyte Disorders:  Hyponatremia:   Will continue to monitor.   Hematologic Disorders:    Anemia:  Anemia present with lowest hemoglobin of 9.1.  Will continue to monitor.           Imaging and Procedures Performed:   RHC 11/25 RA: 9 mmHg RV: 48/6 mmHg PA: 48/25 mmHg mPA: 35 mmHg PCW: 26 mmHg Cardiac index: 3.0 L/min/m  Echo 11/25 CONCLUSION ------------------------------------------------------------------------------- MILD LEFT VENTRICULAR SYSTOLIC DYSFUNCTION WITH MILD LVH ESTIMATED EF: 45%, CALC EF(3D): 45% DIASTOLIC FUNCTION CAN'T BE DETERMINED MILD RIGHT VENTRICULAR SYSTOLIC DYSFUNCTION VALVULAR REGURGITATION: TRIVIAL AR, MILD MR, TRIVIAL PR, TRIVIAL TR   CXR 11/25 1. Cardiac and mediastinal silhouette is enlarged and unchanged, status post median sternotomy. 2. Previously seen edema and bandlike opacities in the LEFT lung base have resolved. Trace residual RIGHT basilar atelectasis.  3. No significant effusion or pneumothorax. 4. Interval removal of LEFT IJ approach central venous catheter.   _____________________  Discharge Exam:  Admission Weight: 82.3 kg (181 lb 7 oz)  Discharge Weight: Weight: 80.2 kg (176 lb 12.9 oz) BMI: Body mass index is 22.69 kg/m. BP 132/78 (BP Location: Left upper arm, Patient Position: Lying)   Pulse 94   Temp 37 C (98.6 F) (Oral)   Resp 16   Ht 188 cm (6' 2.02)   Wt 80.2 kg (176 lb 12.9 oz)   SpO2 99%   BMI 22.69 kg/m   General: alert,  cooperative, and in NAD Respiratory: regular rate, symmetric, unlabored, clear to auscultation bilaterally, and no accessory muscle use Cardiac: regular rate, regular rhythm, S1, S2 present, no murmur, no rub, no gallop, and JVD non-elevated Abdomen: normal bowel sounds, soft, nontender, and nondistended Extremities: extremities warm and well perfused, no clubbing or cyanosis, no edema, and distal pulses intact Lines: none  Pertinent Lab Testing:  BMP: Recent Labs  Lab 06/02/24 0726  NA 139  K 3.6  CL 102  CO2 28  BUN 35*  CREATININE 1.4*  GLUCOSE 199*  CALCIUM  8.6*   CBC: Recent Labs  Lab 06/02/24 0726  WBC 9.1  HGB 9.1*  HCT 28.8*  PLT 249   INR: No results for input(s): INR in the last 168 hours.  FK: Recent Labs  Lab 06/02/24 0726  FK506 5.6   CYA: No results for input(s): CYA in the last 73719 hours. CMV: No results for input(s): CMVDNA, CMVLOG, CMVCOPY in the last 168 hours.   Cholesterol: No results for input(s): CHOLTOTAL, LDLCALC, HDL, TRIG in the last 168 hours.      Other Pertinent Labs: None  _____________________  Time spent on discharge process: >30 minutes  LINDSAY MIZOK BOSTIAN, PA  ------------------------------------------------------------------------------- Attestation signed by Antonieta Darby, MD at 06/02/2024  5:20 PM Attestation Statement:   I personally saw the patient and performed a substantive portion of the medical decision making, in conjunction with the Advanced Practice Provider for the condition/treatment of  2R ACR, pAMR 1, subtherapeutic tacro level. Increased envarsus , dilt booster, treated with solumedrol AFERDITA SPAHILLARI, MD' ------------------------------------------------------------------------------- *Some images could not be shown.

## 2024-05-29 ENCOUNTER — Encounter (HOSPITAL_COMMUNITY)

## 2024-06-01 ENCOUNTER — Telehealth (HOSPITAL_COMMUNITY): Payer: Self-pay | Admitting: *Deleted

## 2024-06-01 ENCOUNTER — Encounter (HOSPITAL_COMMUNITY): Admission: RE | Admit: 2024-06-01

## 2024-06-01 ENCOUNTER — Telehealth (HOSPITAL_COMMUNITY): Payer: Self-pay

## 2024-06-01 ENCOUNTER — Encounter (HOSPITAL_COMMUNITY): Payer: Self-pay | Admitting: *Deleted

## 2024-06-01 DIAGNOSIS — Z941 Heart transplant status: Secondary | ICD-10-CM

## 2024-06-01 NOTE — Telephone Encounter (Signed)
 Left message to call cardiac rehab.Hadassah Elpidio Quan RN BSN

## 2024-06-01 NOTE — Progress Notes (Signed)
 Cardiac Individual Treatment Plan  Patient Details  Name: Lynn Estrada MRN: [id number] Date of Birth: 1961/02/25 Referring Provider:   Flowsheet Row INTENSIVE CARDIAC REHAB ORIENT from 05/21/2024 in [facility]  Referring Provider Dr. Arnie (Wilbert Bihari, MD)    Initial Encounter Date:  Flowsheet Row INTENSIVE CARDIAC REHAB ORIENT from 05/21/2024 in [facility]  Date 05/21/24    Visit Diagnosis: Heart transplant recipient [facility])  Patient's Home Medications on Admission:  Current Outpatient Medications:    ACCU-CHEK GUIDE TEST test strip, 1 strip by Other route as needed (Pt has freestyle [place])., Disp: , Rfl:    acetaminophen  (TYLENOL ) 500 MG tablet, Take 500-1,000 mg by mouth every 6 (six) hours as needed (for pain.)., Disp: , Rfl:    apixaban  (ELIQUIS ) 5 MG TABS tablet, Take 1 tablet (5 mg total) by mouth 2 (two) times daily., Disp: 60 tablet, Rfl: 11   atorvastatin  (LIPITOR) 10 MG tablet, Take 10 mg by mouth in the morning. (Patient not taking: Reported on 05/21/2024), Disp: , Rfl:    atovaquone (MEPRON) 750 MG/5ML suspension, Take 1,500 mg by mouth daily with breakfast., Disp: , Rfl:    Calcium  Carb-Cholecalciferol 600-10 MG-MCG TABS, Take 1 tablet by mouth., Disp: , Rfl:    Continuous Glucose Sensor (FREESTYLE LIBRE 2 PLUS SENSOR) MISC, 1 kit by Other route daily., Disp: , Rfl:    diltiazem (CARDIZEM CD) 120 MG 24 hr capsule, Take 240 mg by mouth., Disp: , Rfl:    Fingerstix Lancets MISC, 1 each by Other route daily., Disp: , Rfl:    furosemide  (LASIX ) 20 MG tablet, Take 40 mg by mouth daily., Disp: , Rfl:    Insulin  Aspart FlexPen (NOVOLOG ) 100 UNIT/ML, Inject 7 Units into the skin daily., Disp: , Rfl:    magnesium  oxide (MAG-OX) 400 MG tablet, Take 400 mg by mouth daily., Disp: , Rfl:    metFORMIN  (GLUCOPHAGE -XR) 500 MG 24 hr tablet, Take 1,000 mg by  mouth 2 (two) times daily with a meal., Disp: , Rfl:    mycophenolate (CELLCEPT) 250 MG capsule, Take 1,500 mg by mouth., Disp: , Rfl:    pantoprazole (PROTONIX) 40 MG tablet, Take 40 mg by mouth., Disp: , Rfl:    predniSONE (DELTASONE) 5 MG tablet, Take 15 mg by mouth daily., Disp: , Rfl:    rosuvastatin (CRESTOR) 10 MG tablet, Take 10 mg by mouth at bedtime., Disp: , Rfl:    senna-docusate (SENOKOT-S) 8.6-50 MG tablet, Take 1 tablet by mouth 2 (two) times daily., Disp: , Rfl:    tacrolimus  (PROGRAF ) 5 MG capsule, Take 2 capsules (10 mg total) by mouth every 12 (twelve) hours. (Patient not taking: Reported on 05/21/2024), Disp: 120 capsule, Rfl: 11   tacrolimus  ER (ENVARSUS  XR) 4 MG TB24, Take 20 mg by mouth daily., Disp: , Rfl:    valGANciclovir (VALCYTE) 450 MG tablet, Take 450 mg by mouth daily., Disp: , Rfl:   Past Medical History: Past Medical History:  Diagnosis Date   Anemia    Arthritis    CHF (congestive heart failure) (HCC) 12/06/2022   EF is 20% to 25 %   COPD (chronic obstructive pulmonary disease) (HCC)    Diabetes mellitus without complication (HCC)    Eczema    GERD (gastroesophageal reflux disease)    History of adenomatous polyp of colon    08-03-2016  tubular adenoma x2 and hyperplastic  History of chronic gastritis 08/03/2016   History of ectopic pregnancy 1988   s/p  left salpingectomy   History of endometriosis    History of kidney stones    History of partial nephrectomy    right pelvis for very large stone   History of pneumonia 07/02/2016   CAP   History of sepsis    07-01-2016  sepsis w/ pyelonephritis, CAP /   12-31-2016  urosepsis w/ kidney stone obstruction   History of uterine leiomyoma    Hx of adenomatous colonic polyps 08/03/2016   2 adenomas, 1 hpp and 1 lost polyp 2018 all < 1 cm    Hyperlipidemia    Hypertension    Left ureteral stone    Myocardial infarction (HCC)    Nephrolithiasis    left obstructive stone and right non-obstructive  stone  per CT 12-31-2016   Urgency of urination     Tobacco Use: Social History   Tobacco Use  Smoking Status Former   Current packs/day: 0.00   Average packs/day: 0.8 packs/day for 5.0 years (3.8 ttl pk-yrs)   Types: Cigarettes   Start date: 01/11/1992   Quit date: 01/10/1997   Years since quitting: 27.4  Smokeless Tobacco Never    Labs: Review Flowsheet  More data exists      Latest Ref Rng & Units 10/11/2023 11/18/2023 11/20/2023 11/21/2023 01/29/2024  Labs for ITP Cardiac and Pulmonary Rehab  Bicarbonate 20.0 - 28.0 mmol/L 25.8  25.9  27.2  26.5  - - 26.7  26.1   TCO2 22 - 32 mmol/L 27  27  28  28   - - 28  27   O2 Saturation % 60  60  45  45  53.1  63.2  47  46     Details       Multiple values from one day are sorted in reverse-chronological order         Capillary Blood Glucose: Lab Results  Component Value Date   GLUCAP 105 (H) 01/29/2024   GLUCAP 97 01/29/2024   GLUCAP 102 (H) 10/15/2023   GLUCAP 95 10/15/2023   GLUCAP 123 (H) 10/14/2023     Exercise Target Goals: Exercise Program Goal: Individual exercise prescription set using results from initial 6 min walk test and THRR while considering  patient's activity barriers and safety.   Exercise Prescription Goal: Initial exercise prescription builds to 30-45 minutes a day of aerobic activity, 2-3 days per week.  Home exercise guidelines will be given to patient during program as part of exercise prescription that the participant will acknowledge.  Activity Barriers & Risk Stratification:  Activity Barriers & Cardiac Risk Stratification - 05/21/24 1204       Activity Barriers & Cardiac Risk Stratification   Activity Barriers Back Problems;Neck/Spine Problems;Joint Problems;Deconditioning;Muscular Weakness;Balance Concerns;Assistive Device    Cardiac Risk Stratification High          6 Minute Walk:  6 Minute Walk     Row Name 05/21/24 1352         6 Minute Walk   Phase Initial     Distance 952  feet     Walk Time 6 minutes     # of Rest Breaks 0     MPH 1.8     METS 3.3     RPE 10     Perceived Dyspnea  0     VO2 Peak 11.4     Symptoms No     Resting HR 89 bpm  Resting BP 130/80     Resting Oxygen Saturation  97 %     Exercise Oxygen Saturation  during 6 min walk 98 %     Max Ex. HR 105 bpm     Max Ex. BP 138/80     2 Minute Post BP 134/78        Oxygen Initial Assessment:   Oxygen Re-Evaluation:   Oxygen Discharge (Final Oxygen Re-Evaluation):   Initial Exercise Prescription:  Initial Exercise Prescription - 05/21/24 1300       Date of Initial Exercise RX and Referring Provider   Date 05/21/24    Referring Provider Dr. Arnie Earley Bihari, MD)    Expected Discharge Date 08/12/24      NuStep   Level 1    SPM 75    Minutes 15    METs 3.3      Track   Laps 8    Minutes 15    METs 3.3      Prescription Details   Frequency (times per week) 3    Duration Progress to 30 minutes of continuous aerobic without signs/symptoms of physical distress      Intensity   THRR 40-80% of Max Heartrate 63-126    Ratings of Perceived Exertion 11-13    Perceived Dyspnea 0-4      Progression   Progression Continue progressive overload as per policy without signs/symptoms or physical distress.      Resistance Training   Training Prescription Yes    Weight 2 lbs    Reps 10-15          Perform Capillary Blood Glucose checks as needed.  Exercise Prescription Changes:   Exercise Comments:   Exercise Goals and Review:   Exercise Goals     Row Name 05/21/24 1108             Exercise Goals   Increase Physical Activity Yes       Intervention Develop an individualized exercise prescription for aerobic and resistive training based on initial evaluation findings, risk stratification, comorbidities and participant's personal goals.;Provide advice, education, support and counseling about physical activity/exercise needs.       Expected Outcomes  Short Term: Attend rehab on a regular basis to increase amount of physical activity.;Long Term: Add in home exercise to make exercise part of routine and to increase amount of physical activity.;Long Term: Exercising regularly at least 3-5 days a week.       Increase Strength and Stamina Yes       Intervention Provide advice, education, support and counseling about physical activity/exercise needs.;Develop an individualized exercise prescription for aerobic and resistive training based on initial evaluation findings, risk stratification, comorbidities and participant's personal goals.       Expected Outcomes Short Term: Increase workloads from initial exercise prescription for resistance, speed, and METs.;Short Term: Perform resistance training exercises routinely during rehab and add in resistance training at home;Long Term: Improve cardiorespiratory fitness, muscular endurance and strength as measured by increased METs and functional capacity ( )       Able to understand and use rate of perceived exertion (RPE) scale Yes       Intervention Provide education and explanation on how to use RPE scale       Expected Outcomes Short Term: Able to use RPE daily in rehab to express subjective intensity level;Long Term:  Able to use RPE to guide intensity level when exercising independently       Knowledge and understanding of Target  Heart Rate Range (THRR) Yes       Intervention Provide education and explanation of THRR including how the numbers were predicted and where they are located for reference       Expected Outcomes Short Term: Able to state/look up THRR;Short Term: Able to use daily as guideline for intensity in rehab;Long Term: Able to use THRR to govern intensity when exercising independently       Understanding of Exercise Prescription Yes       Intervention Provide education, explanation, and written materials on patient's individual exercise prescription       Expected Outcomes Short Term: Able  to explain program exercise prescription;Long Term: Able to explain home exercise prescription to exercise independently          Exercise Goals Re-Evaluation :   Discharge Exercise Prescription (Final Exercise Prescription Changes):   Nutrition:  Target Goals: Understanding of nutrition guidelines, daily intake of sodium 1500mg , cholesterol 200mg , calories 30% from fat and 7% or less from saturated fats, daily to have 5 or more servings of fruits and vegetables.  Biometrics:  Pre Biometrics - 05/21/24 1050       Pre Biometrics   Waist Circumference 37.5 inches    Hip Circumference 40 inches    Waist to Hip Ratio 0.94 %    Triceps Skinfold 14 mm    % Body Fat 32.4 %    Grip Strength 18 kg    Flexibility --   pt declined test   Single Leg Stand 4.12 seconds           Nutrition Therapy Plan and Nutrition Goals:   Nutrition Assessments:  MEDIFICTS Score Key: >=70 Need to make dietary changes  40-70 Heart Healthy Diet <= 40 Therapeutic Level Cholesterol Diet   Flowsheet Row CARDIAC REHAB PHASE II EXERCISE from 03/16/2022 in [facility]  Picture Your Plate Total Score on Discharge 72   Picture Your Plate Scores: <59 Unhealthy dietary pattern with much room for improvement. 41-50 Dietary pattern unlikely to meet recommendations for good health and room for improvement. 51-60 More healthful dietary pattern, with some room for improvement.  >60 Healthy dietary pattern, although there may be some specific behaviors that could be improved.    Nutrition Goals Re-Evaluation:   Nutrition Goals Re-Evaluation:   Nutrition Goals Discharge (Final Nutrition Goals Re-Evaluation):   Psychosocial: Target Goals: Acknowledge presence or absence of significant depression and/or stress, maximize coping skills, provide positive support system. Participant is able to verbalize types and ability to use techniques and skills  needed for reducing stress and depression.  Initial Review & Psychosocial Screening:  Initial Psych Review & Screening - 05/21/24 1116       Initial Review   Current issues with Current Sleep Concerns;Current Stress Concerns;Current Anxiety/Panic    Source of Stress Concerns Chronic Illness    Comments Will continue to monitor and offer support as needed      Family Dynamics   Good Support System? Yes   Pt has her spouse     Barriers   Psychosocial barriers to participate in program The patient should benefit from training in stress management and relaxation.      Screening Interventions   Interventions Encouraged to exercise;Provide feedback about the scores to participant    Expected Outcomes Short Term goal: Identification and review with participant of any Quality of Life or Depression concerns found by scoring the questionnaire.;Long Term Goal: Stressors or current  issues are controlled or eliminated.;Long Term goal: The participant improves quality of Life and PHQ9 Scores as seen by post scores and/or verbalization of changes          Quality of Life Scores:  Quality of Life - 05/21/24 1358       Quality of Life   Select Quality of Life      Quality of Life Scores   Health/Function Pre 24.29 %    Socioeconomic Pre 28.33 %    Psych/Spiritual Pre 28.29 %    Family Pre 27.6 %    GLOBAL Pre 26.44 %         Scores of 19 and below usually indicate a poorer quality of life in these areas.  A difference of  2-3 points is a clinically meaningful difference.  A difference of 2-3 points in the total score of the Quality of Life Index has been associated with significant improvement in overall quality of life, self-image, physical symptoms, and general health in studies assessing change in quality of life.  PHQ-9: Review Flowsheet  More data exists      05/21/2024 03/16/2022 01/11/2022 10/29/2017 09/11/2017  Depression screen PHQ 2/9  Decreased Interest 0 0 0 0 0  Down,  Depressed, Hopeless 0 0 1 0 0  PHQ - 2 Score 0 0 1 0 0  Altered sleeping 2 - 0 - -  Tired, decreased energy 2 - 1 - -  Change in appetite 1 - 0 - -  Feeling bad or failure about yourself  0 - 0 - -  Trouble concentrating 0 - 0 - -  Moving slowly or fidgety/restless 0 - 0 - -  Suicidal thoughts 0 - 0 - -  PHQ-9 Score 5 - 2  - -  Difficult doing work/chores Somewhat difficult - Somewhat difficult - -    Details       Data saved with a previous flowsheet row definition        Interpretation of Total Score  Total Score Depression Severity:  1-4 = Minimal depression, 5-9 = Mild depression, 10-14 = Moderate depression, 15-19 = Moderately severe depression, 20-27 = Severe depression   Psychosocial Evaluation and Intervention:   Psychosocial Re-Evaluation:   Psychosocial Discharge (Final Psychosocial Re-Evaluation):   Vocational Rehabilitation: Provide vocational rehab assistance to qualifying candidates.   Vocational Rehab Evaluation & Intervention:  Vocational Rehab - 05/21/24 1118       Initial Vocational Rehab Evaluation & Intervention   Assessment shows need for Vocational Rehabilitation --   Pt is retired         Education: Education Goals: Education classes will be provided on a weekly basis, covering required topics. Participant will state understanding/return demonstration of topics presented.     Core Videos: Exercise    Move It!  Clinical staff conducted group or individual video education with verbal and written material and guidebook.  Patient learns the recommended Pritikin exercise program. Exercise with the goal of living a long, healthy life. Some of the health benefits of exercise include controlled diabetes, healthier blood pressure levels, improved cholesterol levels, improved heart and lung capacity, improved sleep, and better body composition. Everyone should speak with their doctor before starting or changing an exercise routine.  Biomechanical  Limitations Clinical staff conducted group or individual video education with verbal and written material and guidebook.  Patient learns how biomechanical limitations can impact exercise and how we can mitigate and possibly overcome limitations to have an impactful and balanced exercise  routine.  Body Composition Clinical staff conducted group or individual video education with verbal and written material and guidebook.  Patient learns that body composition (ratio of muscle mass to fat mass) is a key component to assessing overall fitness, rather than body weight alone. Increased fat mass, especially visceral belly fat, can put us  at increased risk for metabolic syndrome, type 2 diabetes, heart disease, and even death. It is recommended to combine diet and exercise (cardiovascular and resistance training) to improve your body composition. Seek guidance from your physician and exercise physiologist before implementing an exercise routine.  Exercise Action Plan Clinical staff conducted group or individual video education with verbal and written material and guidebook.  Patient learns the recommended strategies to achieve and enjoy long-term exercise adherence, including variety, self-motivation, self-efficacy, and positive decision making. Benefits of exercise include fitness, good health, weight management, more energy, better sleep, less stress, and overall well-being.  Medical   Heart Disease Risk Reduction Clinical staff conducted group or individual video education with verbal and written material and guidebook.  Patient learns our heart is our most vital organ as it circulates oxygen, nutrients, white blood cells, and hormones throughout the entire body, and carries waste away. Data supports a plant-based eating plan like the Pritikin Program for its effectiveness in slowing progression of and reversing heart disease. The video provides a number of recommendations to address heart  disease.   Metabolic Syndrome and Belly Fat  Clinical staff conducted group or individual video education with verbal and written material and guidebook.  Patient learns what metabolic syndrome is, how it leads to heart disease, and how one can reverse it and keep it from coming back. You have metabolic syndrome if you have 3 of the following 5 criteria: abdominal obesity, high blood pressure, high triglycerides, low HDL cholesterol, and high blood sugar.  Hypertension and Heart Disease Clinical staff conducted group or individual video education with verbal and written material and guidebook.  Patient learns that high blood pressure, or hypertension, is very common in the [place] . Hypertension is largely due to excessive salt intake, but other important risk factors include being overweight, physical inactivity, drinking too much alcohol, smoking, and not eating enough potassium from fruits and vegetables. High blood pressure is a leading risk factor for heart attack, stroke, congestive heart failure, dementia, kidney failure, and premature death. Long-term effects of excessive salt intake include stiffening of the arteries and thickening of heart muscle and organ damage. Recommendations include ways to reduce hypertension and the risk of heart disease.  Diseases of Our Time - Focusing on Diabetes Clinical staff conducted group or individual video education with verbal and written material and guidebook.  Patient learns why the best way to stop diseases of our time is prevention, through food and other lifestyle changes. Medicine (such as prescription pills and surgeries) is often only a Band-Aid on the problem, not a long-term solution. Most common diseases of our time include obesity, type 2 diabetes, hypertension, heart disease, and cancer. The Pritikin Program is recommended and has been proven to help reduce, reverse, and/or prevent the damaging effects of metabolic syndrome.  Nutrition    Overview of the Pritikin Eating Plan  Clinical staff conducted group or individual video education with verbal and written material and guidebook.  Patient learns about the Pritikin Eating Plan for disease risk reduction. The Pritikin Eating Plan emphasizes a wide variety of unrefined, minimally-processed carbohydrates, like fruits, vegetables, whole grains, and legumes. Go, Caution, and Stop food  choices are explained. Plant-based and lean animal proteins are emphasized. Rationale provided for low sodium intake for blood pressure control, low added sugars for blood sugar stabilization, and low added fats and oils for coronary artery disease risk reduction and weight management.  Calorie Density  Clinical staff conducted group or individual video education with verbal and written material and guidebook.  Patient learns about calorie density and how it impacts the Pritikin Eating Plan. Knowing the characteristics of the food you choose will help you decide whether those foods will lead to weight gain or weight loss, and whether you want to consume more or less of them. Weight loss is usually a side effect of the Pritikin Eating Plan because of its focus on low calorie-dense foods.  Label Reading  Clinical staff conducted group or individual video education with verbal and written material and guidebook.  Patient learns about the Pritikin recommended label reading guidelines and corresponding recommendations regarding calorie density, added sugars, sodium content, and whole grains.  Dining Out - Part 1  Clinical staff conducted group or individual video education with verbal and written material and guidebook.  Patient learns that restaurant meals can be sabotaging because they can be so high in calories, fat, sodium, and/or sugar. Patient learns recommended strategies on how to positively address this and avoid unhealthy pitfalls.  Facts on Fats  Clinical staff conducted group or individual video  education with verbal and written material and guidebook.  Patient learns that lifestyle modifications can be just as effective, if not more so, as many medications for lowering your risk of heart disease. A Pritikin lifestyle can help to reduce your risk of inflammation and atherosclerosis (cholesterol build-up, or plaque, in the artery walls). Lifestyle interventions such as dietary choices and physical activity address the cause of atherosclerosis. A review of the types of fats and their impact on blood cholesterol levels, along with dietary recommendations to reduce fat intake is also included.  Nutrition Action Plan  Clinical staff conducted group or individual video education with verbal and written material and guidebook.  Patient learns how to incorporate Pritikin recommendations into their lifestyle. Recommendations include planning and keeping personal health goals in mind as an important part of their success.  Healthy Mind-Set    Healthy Minds, Bodies, Hearts  Clinical staff conducted group or individual video education with verbal and written material and guidebook.  Patient learns how to identify when they are stressed. Video will discuss the impact of that stress, as well as the many benefits of stress management. Patient will also be introduced to stress management techniques. The way we think, act, and feel has an impact on our hearts.  How Our Thoughts Can Heal Our Hearts  Clinical staff conducted group or individual video education with verbal and written material and guidebook.  Patient learns that negative thoughts can cause depression and anxiety. This can result in negative lifestyle behavior and serious health problems. Cognitive behavioral therapy is an effective method to help control our thoughts in order to change and improve our emotional outlook.  Additional Videos:  Exercise    Improving Performance  Clinical staff conducted group or individual video education with  verbal and written material and guidebook.  Patient learns to use a non-linear approach by alternating intensity levels and lengths of time spent exercising to help burn more calories and lose more body fat. Cardiovascular exercise helps improve heart health, metabolism, hormonal balance, blood sugar control, and recovery from fatigue. Resistance training improves strength, endurance,  balance, coordination, reaction time, metabolism, and muscle mass. Flexibility exercise improves circulation, posture, and balance. Seek guidance from your physician and exercise physiologist before implementing an exercise routine and learn your capabilities and proper form for all exercise.  Introduction to Yoga  Clinical staff conducted group or individual video education with verbal and written material and guidebook.  Patient learns about yoga, a discipline of the coming together of mind, breath, and body. The benefits of yoga include improved flexibility, improved range of motion, better posture and core strength, increased lung function, weight loss, and positive self-image. Yoga's heart health benefits include lowered blood pressure, healthier heart rate, decreased cholesterol and triglyceride levels, improved immune function, and reduced stress. Seek guidance from your physician and exercise physiologist before implementing an exercise routine and learn your capabilities and proper form for all exercise.  Medical   Aging: Enhancing Your Quality of Life  Clinical staff conducted group or individual video education with verbal and written material and guidebook.  Patient learns key strategies and recommendations to stay in good physical health and enhance quality of life, such as prevention strategies, having an advocate, securing a Health Care Proxy and Power of Attorney, and keeping a list of medications and system for tracking them. It also discusses how to avoid risk for bone loss.  Biology of Weight Control   Clinical staff conducted group or individual video education with verbal and written material and guidebook.  Patient learns that weight gain occurs because we consume more calories than we burn (eating more, moving less). Even if your body weight is normal, you may have higher ratios of fat compared to muscle mass. Too much body fat puts you at increased risk for cardiovascular disease, heart attack, stroke, type 2 diabetes, and obesity-related cancers. In addition to exercise, following the Pritikin Eating Plan can help reduce your risk.  Decoding Lab Results  Clinical staff conducted group or individual video education with verbal and written material and guidebook.  Patient learns that lab test reflects one measurement whose values change over time and are influenced by many factors, including medication, stress, sleep, exercise, food, hydration, pre-existing medical conditions, and more. It is recommended to use the knowledge from this video to become more involved with your lab results and evaluate your numbers to speak with your doctor.   Diseases of Our Time - Overview  Clinical staff conducted group or individual video education with verbal and written material and guidebook.  Patient learns that according to the CDC, 50% to 70% of chronic diseases (such as obesity, type 2 diabetes, elevated lipids, hypertension, and heart disease) are avoidable through lifestyle improvements including healthier food choices, listening to satiety cues, and increased physical activity.  Sleep Disorders Clinical staff conducted group or individual video education with verbal and written material and guidebook.  Patient learns how good quality and duration of sleep are important to overall health and well-being. Patient also learns about sleep disorders and how they impact health along with recommendations to address them, including discussing with a physician.  Nutrition  Dining Out - Part 2 Clinical staff  conducted group or individual video education with verbal and written material and guidebook.  Patient learns how to plan ahead and communicate in order to maximize their dining experience in a healthy and nutritious manner. Included are recommended food choices based on the type of restaurant the patient is visiting.   Fueling a Banker conducted group or individual video education with verbal and  written material and guidebook.  There is a strong connection between our food choices and our health. Diseases like obesity and type 2 diabetes are very prevalent and are in large-part due to lifestyle choices. The Pritikin Eating Plan provides plenty of food and hunger-curbing satisfaction. It is easy to follow, affordable, and helps reduce health risks.  Menu Workshop  Clinical staff conducted group or individual video education with verbal and written material and guidebook.  Patient learns that restaurant meals can sabotage health goals because they are often packed with calories, fat, sodium, and sugar. Recommendations include strategies to plan ahead and to communicate with the manager, chef, or server to help order a healthier meal.  Planning Your Eating Strategy  Clinical staff conducted group or individual video education with verbal and written material and guidebook.  Patient learns about the Pritikin Eating Plan and its benefit of reducing the risk of disease. The Pritikin Eating Plan does not focus on calories. Instead, it emphasizes high-quality, nutrient-rich foods. By knowing the characteristics of the foods, we choose, we can determine their calorie density and make informed decisions.  Targeting Your Nutrition Priorities  Clinical staff conducted group or individual video education with verbal and written material and guidebook.  Patient learns that lifestyle habits have a tremendous impact on disease risk and progression. This video provides eating and physical  activity recommendations based on your personal health goals, such as reducing LDL cholesterol, losing weight, preventing or controlling type 2 diabetes, and reducing high blood pressure.  Vitamins and Minerals  Clinical staff conducted group or individual video education with verbal and written material and guidebook.  Patient learns different ways to obtain key vitamins and minerals, including through a recommended healthy diet. It is important to discuss all supplements you take with your doctor.   Healthy Mind-Set    Smoking Cessation  Clinical staff conducted group or individual video education with verbal and written material and guidebook.  Patient learns that cigarette smoking and tobacco addiction pose a serious health risk which affects millions of people. Stopping smoking will significantly reduce the risk of heart disease, lung disease, and many forms of cancer. Recommended strategies for quitting are covered, including working with your doctor to develop a successful plan.  Culinary   Becoming a Set Designer conducted group or individual video education with verbal and written material and guidebook.  Patient learns that cooking at home can be healthy, cost-effective, quick, and puts them in control. Keys to cooking healthy recipes will include looking at your recipe, assessing your equipment needs, planning ahead, making it simple, choosing cost-effective seasonal ingredients, and limiting the use of added fats, salts, and sugars.  Cooking - Breakfast and Snacks  Clinical staff conducted group or individual video education with verbal and written material and guidebook.  Patient learns how important breakfast is to satiety and nutrition through the entire day. Recommendations include key foods to eat during breakfast to help stabilize blood sugar levels and to prevent overeating at meals later in the day. Planning ahead is also a key component.  Cooking - Psychologist, Educational conducted group or individual video education with verbal and written material and guidebook.  Patient learns eating strategies to improve overall health, including an approach to cook more at home. Recommendations include thinking of animal protein as a side on your plate rather than center stage and focusing instead on lower calorie dense options like vegetables, fruits, whole grains, and plant-based proteins,  such as beans. Making sauces in large quantities to freeze for later and leaving the skin on your vegetables are also recommended to maximize your experience.  Cooking - Healthy Salads and Dressing Clinical staff conducted group or individual video education with verbal and written material and guidebook.  Patient learns that vegetables, fruits, whole grains, and legumes are the foundations of the Pritikin Eating Plan. Recommendations include how to incorporate each of these in flavorful and healthy salads, and how to create homemade salad dressings. Proper handling of ingredients is also covered. Cooking - Soups and [employer] - Soups and Desserts Clinical staff conducted group or individual video education with verbal and written material and guidebook.  Patient learns that Pritikin soups and desserts make for easy, nutritious, and delicious snacks and meal components that are low in sodium, fat, sugar, and calorie density, while high in vitamins, minerals, and filling fiber. Recommendations include simple and healthy ideas for soups and desserts.   Overview     The Pritikin Solution Program Overview Clinical staff conducted group or individual video education with verbal and written material and guidebook.  Patient learns that the results of the Pritikin Program have been documented in more than 100 articles published in peer-reviewed journals, and the benefits include reducing risk factors for (and, in some cases, even reversing) high cholesterol, high  blood pressure, type 2 diabetes, obesity, and more! An overview of the three key pillars of the Pritikin Program will be covered: eating well, doing regular exercise, and having a healthy mind-set.  WORKSHOPS  Exercise: Exercise Basics: Building Your Action Plan Clinical staff led group instruction and group discussion with PowerPoint presentation and patient guidebook. To enhance the learning environment the use of posters, models and videos may be added. At the conclusion of this workshop, patients will comprehend the difference between physical activity and exercise, as well as the benefits of incorporating both, into their routine. Patients will understand the FITT (Frequency, Intensity, Time, and Type) principle and how to use it to build an exercise action plan. In addition, safety concerns and other considerations for exercise and cardiac rehab will be addressed by the presenter. The purpose of this lesson is to promote a comprehensive and effective weekly exercise routine in order to improve patients' overall level of fitness.   Managing Heart Disease: Your Path to a Healthier Heart Clinical staff led group instruction and group discussion with PowerPoint presentation and patient guidebook. To enhance the learning environment the use of posters, models and videos may be added.At the conclusion of this workshop, patients will understand the anatomy and physiology of the heart. Additionally, they will understand how Pritikin's three pillars impact the risk factors, the progression, and the management of heart disease.  The purpose of this lesson is to provide a high-level overview of the heart, heart disease, and how the Pritikin lifestyle positively impacts risk factors.  Exercise Biomechanics Clinical staff led group instruction and group discussion with PowerPoint presentation and patient guidebook. To enhance the learning environment the use of posters, models and videos may be added.  Patients will learn how the structural parts of their bodies function and how these functions impact their daily activities, movement, and exercise. Patients will learn how to promote a neutral spine, learn how to manage pain, and identify ways to improve their physical movement in order to promote healthy living. The purpose of this lesson is to expose patients to common physical limitations that impact physical activity. Participants will learn practical ways  to adapt and manage aches and pains, and to minimize their effect on regular exercise. Patients will learn how to maintain good posture while sitting, walking, and lifting.  Balance Training and Fall Prevention  Clinical staff led group instruction and group discussion with PowerPoint presentation and patient guidebook. To enhance the learning environment the use of posters, models and videos may be added. At the conclusion of this workshop, patients will understand the importance of their sensorimotor skills (vision, proprioception, and the vestibular system) in maintaining their ability to balance as they age. Patients will apply a variety of balancing exercises that are appropriate for their current level of function. Patients will understand the common causes for poor balance, possible solutions to these problems, and ways to modify their physical environment in order to minimize their fall risk. The purpose of this lesson is to teach patients about the importance of maintaining balance as they age and ways to minimize their risk of falling.  WORKSHOPS   Nutrition:  Fueling a Ship Broker led group instruction and group discussion with PowerPoint presentation and patient guidebook. To enhance the learning environment the use of posters, models and videos may be added. Patients will review the foundational principles of the Pritikin Eating Plan and understand what constitutes a serving size in each of the food groups.  Patients will also learn Pritikin-friendly foods that are better choices when away from home and review make-ahead meal and snack options. Calorie density will be reviewed and applied to three nutrition priorities: weight maintenance, weight loss, and weight gain. The purpose of this lesson is to reinforce (in a group setting) the key concepts around what patients are recommended to eat and how to apply these guidelines when away from home by planning and selecting Pritikin-friendly options. Patients will understand how calorie density may be adjusted for different weight management goals.  Mindful Eating  Clinical staff led group instruction and group discussion with PowerPoint presentation and patient guidebook. To enhance the learning environment the use of posters, models and videos may be added. Patients will briefly review the concepts of the Pritikin Eating Plan and the importance of low-calorie dense foods. The concept of mindful eating will be introduced as well as the importance of paying attention to internal hunger signals. Triggers for non-hunger eating and techniques for dealing with triggers will be explored. The purpose of this lesson is to provide patients with the opportunity to review the basic principles of the Pritikin Eating Plan, discuss the value of eating mindfully and how to measure internal cues of hunger and fullness using the Hunger Scale. Patients will also discuss reasons for non-hunger eating and learn strategies to use for controlling emotional eating.  Targeting Your Nutrition Priorities Clinical staff led group instruction and group discussion with PowerPoint presentation and patient guidebook. To enhance the learning environment the use of posters, models and videos may be added. Patients will learn how to determine their genetic susceptibility to disease by reviewing their family history. Patients will gain insight into the importance of diet as part of an overall healthy  lifestyle in mitigating the impact of genetics and other environmental insults. The purpose of this lesson is to provide patients with the opportunity to assess their personal nutrition priorities by looking at their family history, their own health history and current risk factors. Patients will also be able to discuss ways of prioritizing and modifying the Pritikin Eating Plan for their highest risk areas  Menu  Clinical staff led group  instruction and group discussion with PowerPoint presentation and patient guidebook. To enhance the learning environment the use of posters, models and videos may be added. Using menus brought in from [employer], or printed from [employer], patients will apply the Pritikin dining out guidelines that were presented in the [employer] video. Patients will also be able to practice these guidelines in a variety of provided scenarios. The purpose of this lesson is to provide patients with the opportunity to practice hands-on learning of the Pritikin Dining Out guidelines with actual menus and practice scenarios.  Label Reading Clinical staff led group instruction and group discussion with PowerPoint presentation and patient guidebook. To enhance the learning environment the use of posters, models and videos may be added. Patients will review and discuss the Pritikin label reading guidelines presented in Pritikin's Label Reading Educational series video. Using fool labels brought in from local grocery stores and markets, patients will apply the label reading guidelines and determine if the packaged food meet the Pritikin guidelines. The purpose of this lesson is to provide patients with the opportunity to review, discuss, and practice hands-on learning of the Pritikin Label Reading guidelines with actual packaged food labels. Cooking School  Pritikin's [employer] are designed to teach patients ways to prepare quick, simple, and  affordable recipes at home. The importance of nutrition's role in chronic disease risk reduction is reflected in its emphasis in the overall Pritikin program. By learning how to prepare essential core Pritikin Eating Plan recipes, patients will increase control over what they eat; be able to customize the flavor of foods without the use of added salt, sugar, or fat; and improve the quality of the food they consume. By learning a set of core recipes which are easily assembled, quickly prepared, and affordable, patients are more likely to prepare more healthy foods at home. These workshops focus on convenient breakfasts, simple entres, side dishes, and desserts which can be prepared with minimal effort and are consistent with nutrition recommendations for cardiovascular risk reduction. Cooking [employer] are taught by a armed forces logistics/support/administrative officer (RD) who has been trained by the [employer]. The chef or RD has a clear understanding of the importance of minimizing - if not completely eliminating - added fat, sugar, and sodium in recipes. Throughout the series of Cooking School Workshop sessions, patients will learn about healthy ingredients and efficient methods of cooking to build confidence in their capability to prepare    Cooking School weekly topics:  Adding Flavor- Sodium-Free  Fast and Healthy Breakfasts  Powerhouse Plant-Based Proteins  Satisfying Salads and Dressings  Simple Sides and Sauces  International Cuisine-Spotlight on the [employer] Zones  Delicious Desserts  Savory Soups  [employer] - Meals in a Astronomer Appetizers and Snacks  Comforting Weekend Breakfasts  One-Pot Wonders   Fast Evening Meals  Landscape Architect Your Pritikin Plate  WORKSHOPS   Healthy Mindset (Psychosocial):  Focused Goals, Sustainable Changes Clinical staff led group instruction and group discussion with PowerPoint presentation and patient guidebook. To enhance  the learning environment the use of posters, models and videos may be added. Patients will be able to apply effective goal setting strategies to establish at least one personal goal, and then take consistent, meaningful action toward that goal. They will learn to identify common barriers to achieving personal goals and develop strategies to overcome them. Patients will also gain an understanding of how our mind-set can impact our ability to  achieve goals and the importance of cultivating a positive and growth-oriented mind-set. The purpose of this lesson is to provide patients with a deeper understanding of how to set and achieve personal goals, as well as the tools and strategies needed to overcome common obstacles which may arise along the way.  From Head to Heart: The Power of a Healthy Outlook  Clinical staff led group instruction and group discussion with PowerPoint presentation and patient guidebook. To enhance the learning environment the use of posters, models and videos may be added. Patients will be able to recognize and describe the impact of emotions and mood on physical health. They will discover the importance of self-care and explore self-care practices which may work for them. Patients will also learn how to utilize the 4 C's to cultivate a healthier outlook and better manage stress and challenges. The purpose of this lesson is to demonstrate to patients how a healthy outlook is an essential part of maintaining good health, especially as they continue their cardiac rehab journey.  Healthy Sleep for a Healthy Heart Clinical staff led group instruction and group discussion with PowerPoint presentation and patient guidebook. To enhance the learning environment the use of posters, models and videos may be added. At the conclusion of this workshop, patients will be able to demonstrate knowledge of the importance of sleep to overall health, well-being, and quality of life. They will understand the  symptoms of, and treatments for, common sleep disorders. Patients will also be able to identify daytime and nighttime behaviors which impact sleep, and they will be able to apply these tools to help manage sleep-related challenges. The purpose of this lesson is to provide patients with a general overview of sleep and outline the importance of quality sleep. Patients will learn about a few of the most common sleep disorders. Patients will also be introduced to the concept of "sleep hygiene," and discover ways to self-manage certain sleeping problems through simple daily behavior changes. Finally, the workshop will motivate patients by clarifying the links between quality sleep and their goals of heart-healthy living.   Recognizing and Reducing Stress Clinical staff led group instruction and group discussion with PowerPoint presentation and patient guidebook. To enhance the learning environment the use of posters, models and videos may be added. At the conclusion of this workshop, patients will be able to understand the types of stress reactions, differentiate between acute and chronic stress, and recognize the impact that chronic stress has on their health. They will also be able to apply different coping mechanisms, such as reframing negative self-talk. Patients will have the opportunity to practice a variety of stress management techniques, such as deep abdominal breathing, progressive muscle relaxation, and/or guided imagery.  The purpose of this lesson is to educate patients on the role of stress in their lives and to provide healthy techniques for coping with it.  Learning Barriers/Preferences:  Learning Barriers/Preferences - 05/21/24 1118       Learning Barriers/Preferences   Learning Barriers Sight    Learning Preferences Written Material;Audio;Computer/Internet;Group Instruction;Individual Instruction;Pictoral;Skilled Demonstration;Verbal Instruction;Video          Education  Topics:  Knowledge Questionnaire Score:  Knowledge Questionnaire Score - 05/21/24 1124       Knowledge Questionnaire Score   Pre Score 21/24          Core Components/Risk Factors/Patient Goals at Admission:  Personal Goals and Risk Factors at Admission - 05/21/24 1118       Core Components/Risk Factors/Patient Goals on Admission  Diabetes Yes    Intervention Provide education about signs/symptoms and action to take for hypo/hyperglycemia.;Provide education about proper nutrition, including hydration, and aerobic/resistive exercise prescription along with prescribed medications to achieve blood glucose in normal ranges: Fasting glucose 65-99 mg/dL    Expected Outcomes Short Term: Participant verbalizes understanding of the signs/symptoms and immediate care of hyper/hypoglycemia, proper foot care and importance of medication, aerobic/resistive exercise and nutrition plan for blood glucose control.;Long Term: Attainment of HbA1C < 7%.    Hypertension Yes    Intervention Provide education on lifestyle modifcations including regular physical activity/exercise, weight management, moderate sodium restriction and increased consumption of fresh fruit, vegetables, and low fat dairy, alcohol moderation, and smoking cessation.;Monitor prescription use compliance.    Expected Outcomes Short Term: Continued assessment and intervention until BP is < 140/53mm HG in hypertensive participants. < 130/55mm HG in hypertensive participants with diabetes, heart failure or chronic kidney disease.;Long Term: Maintenance of blood pressure at goal levels.    Lipids Yes    Intervention Provide education and support for participant on nutrition & aerobic/resistive exercise along with prescribed medications to achieve LDL 70mg , HDL >40mg .    Expected Outcomes Long Term: Cholesterol controlled with medications as prescribed, with individualized exercise RX and with personalized nutrition plan. Value goals: LDL < 70mg ,  HDL > 40 mg.;Short Term: Participant states understanding of desired cholesterol values and is compliant with medications prescribed. Participant is following exercise prescription and nutrition guidelines.    Stress Yes    Intervention Offer individual and/or small group education and counseling on adjustment to heart disease, stress management and health-related lifestyle change. Teach and support self-help strategies.;Refer participants experiencing significant psychosocial distress to appropriate mental health specialists for further evaluation and treatment. When possible, include family members and significant others in education/counseling sessions.    Expected Outcomes Short Term: Participant demonstrates changes in health-related behavior, relaxation and other stress management skills, ability to obtain effective social support, and compliance with psychotropic medications if prescribed.;Long Term: Emotional wellbeing is indicated by absence of clinically significant psychosocial distress or social isolation.          Core Components/Risk Factors/Patient Goals Review:    Core Components/Risk Factors/Patient Goals at Discharge (Final Review):    ITP Comments:  ITP Comments     Row Name 05/21/24 1106 06/01/24 1634         ITP Comments Wilbert Bihari, MD: Medical Director.  Introduction to the [facility] program/Intensive cardiac rehab. Initial orientation packet reviewed with the patient. 30 Day ITP review. Exercise is currently on hold due to recent hospitalization. [place] has not started exercise yet.         Comments: See ITP comments.Hadassah Elpidio Quan RN BSN

## 2024-06-01 NOTE — Progress Notes (Signed)
 Cardiology Progress Note    06/01/2024 Hospital Day: 7  Lynn Estrada is a 63 y.o.female who was admitted on 05/26/2024. 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 Presents with fatigue and FK level <2.0. ACR on EMBX 11/25 (2R pAMR 1) s/p steroids. AKI iso diarrhea, resolved. FK trough rising, plan to discharge home 12/2   Subjective:   No events overnight. Has pillbox at bedside. Husband working so planning to discharge 12/2.   Objective:   Vital signs in last 24 hours: Temp:  [36.7 C (98 F)-36.9 C (98.5 F)] 36.7 C (98.1 F) Heart Rate:  [77-90] 77 Resp:  [16-18] 16 BP: (114-143)/(70-81) 120/80 SpO2: 100 % Last BM Date: 06/01/24  Weights: Last weight: 81.6 kg (179 lb 14.4 oz)    First weight: 82.3 kg (181 lb 7 oz) BMI: Body mass index is 23.09 kg/m.  24-Hour Intake/Output: I/O last 2 completed shifts: In: 1460 [P.O.:1460] Out: 3550 [Urine:3550]  Telemetry: SR 80-90s  Physical Exam:  General: alert, cooperative, and in NAD Respiratory: regular rate, symmetric, unlabored, clear to auscultation bilaterally, and no accessory muscle use Cardiac: regular rate, regular rhythm, S1, S2 present, no murmur, no rub, no gallop, and JVD non-elevated Abdomen: normal bowel sounds, soft, nontender, nondistended, and bowel sounds present in all 4 quadrants Extremities: extremities warm and well perfused, no clubbing or cyanosis, no edema, and distal pulses intact Lines: PIV  Labs:  BMP: Recent Labs  Lab 05/26/24 0803 05/27/24 0623 06/01/24 0607  NA 140   < > 140  K 3.7   < > 3.8  CL 99   < > 105  CO2 25   < > 27  BUN 28*   < > 38*  CREATININE 1.3*   < > 1.4*  GLUCOSE 119   < > 155*  CALCIUM  9.0   < > 8.5*  MG 1.7*  --   --    < > =  values in this interval not displayed.   CBC: Recent Labs  Lab 06/01/24 0607  WBC 7.9  HGB 8.6*  HCT 27.6*  PLT 229   INR: No results for input(s): INR in the last 168 hours.  FK: Recent Labs  Lab 06/01/24 0607  FK506 5.1   CYA: No results for input(s): CYA in the last 73719 hours. LDH: No results for input(s): LDH in the last 168 hours.       Scheduled Medications: .  acetaminophen , 650 mg, Oral, Q6H PRN .  apixaban , 5 mg, Oral, Q12H SCH .  atovaquone, 1,500 mg, Oral, Daily with breakfast .  calcium  carbonate, 300 mg of elemental, Oral, BID PRN .  calcium  carbonate-vitamin D3, 1 tablet, Oral, BID LS .  dextrose  50% in water , 12.5-25 g, Intravenous, As Directed .  dilTIAZem, 240 mg, Oral, Daily .  FUROsemide , 40 mg, Oral, Daily .  glucagon, 1 mg, Subcutaneous, As Directed .  guaiFENesin , 600 mg, Oral, BID .  insulin  LISPRO (AdmeLOG, HumaLOG) injection, 10 Units, Subcutaneous, Daily with dinner .  [START ON 06/02/2024] insulin  LISPRO (AdmeLOG, HumaLOG) injection, 10 Units, Subcutaneous, Daily with lunch .  [START ON 06/02/2024] insulin  LISPRO (AdmeLOG, HumaLOG) injection, 10 Units, Subcutaneous, Daily with breakfast .  insulin  LISPRO (AdmeLOG, HumaLOG) injection, 0-4 Units, Subcutaneous, TID CC .  lidocaine ,  0.5 mL, Subcutaneous, As Directed .  mycophenolate, 1,500 mg, Oral, Q12H SCH .  pantoprazole, 40 mg, Oral, Daily .  polyethylene glycol, 17 g, Oral, Daily .  predniSONE, 15 mg, Oral, Daily .  rosuvastatin, 10 mg, Oral, Daily .  sennosides-docusate, 2 tablet, Oral, BID .  simethicone , 80 mg, Oral, QID PRN .  tacrolimus , 24 mg, Oral, Daily .  valGANciclovir, 450 mg, Oral, QPM  Assessment/Plan:   Principal Problem:   Heart transplant rejection (CMS/HHS-HCC) Active Problems:   DM2 (diabetes mellitus, type 2) (CMS/HHS-HCC)   S/P orthotopic heart transplant: 02/13/2024   Acute antibody mediated rejection of graft   Immunosuppressed status (HHS-HCC)   CKD stage  3b, GFR 30-44 ml/min (CMS-HCC)   # ACR  # NICM s/p OHT 02/13/24 # Hx recurrent ACR/AMR (s/p multiple steroid bursts and most recently PLEX x4 and IVIG 04/09/24, now on monthly IVIG) # Subtherapeutic FK Presenting with fatigue and exercise intolerance concerning for recurrent rejection given TTE with reduced function EF 35-40% and subtherapeutic FK <2.0. Of note, has not been within goal since early October and concern for medication challenges at home. RHC 11/25 RA 9, PCW 26, CI 3. Last HLA 11/6 0/0, AT1R negative. EMBx 11/25: 2R, pAMR 1 H+ - S/p 3 doses of IV solumedrol  - Restarted home prednisone 11/28 - FK 5.1 (12/1). FK goal 8-10; Continue Envarsus  24 mg. Daily levels  - Continue diltiazem booster (restarted 11/28) - Continue mycophenolate 1500 BID - On monthly IVIG outpatient, last dose 11/24 - Continue home PO lasix  40mg  qd   # Prophylaxis - PJP: continue atovaquone. Allergy with Bactrim (itching and rash) - CMV: intermediate risk but plan to continue valganciclovir for 12 months given high dose immunosuppression - Continue aspirin , rosuvastatin - Continue Ca-Vit D3   # T2DM # Hyperglycemia  A1c 8 04/18/2024. On metformin  and insulin  aspart 7U with meals plus correction. Persistent hyperglycemia ISO steroid dosing.  - Endocrine following and adjusting insulin     # acute on CKD stage 3b, resolved # Diarrhea, resolved Baseline Cr 1.3-1.6. Creatinine peak 2.9 s/p 1L IVF 11/28. Was having diarrhea, C diff negative. FENa 0.3%, pre-renal. UA dirty. Urine culture negative.  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 Denies signs or symptoms of bleeding. - Ordered iron  studies, folate, and VB12 levels   Comorbid Conditions: Electrolyte Disorders:    Hyponatremia:   Will continue to monitor.   Hematologic Disorders:    Anemia:  Anemia present with lowest hemoglobin of 8.6.  Will continue to monitor.          Code Status: Full Code VTE  Prophylaxis:  VTE Prophylaxis  + Anticoagulant & Anti-Embolism Compression Device Ordered  apixaban , 5 mg, Oral, Q12H SCH, 5 mg at 06/01/24 0856       Discharge Planning: Anticipated Discharge Destination: Home   LINDSAY [facility] [place], [place]  ------------------------------------------------------------------------------- Attestation signed by Antonieta Darby, MD at 06/01/2024  4:06 PM Attestation Statement:   I personally saw the patient and performed a substantive portion of the medical decision making, in conjunction with the Advanced Practice Provider for the condition/treatment of    Here with 2R, pAMR 1-h rejection treated with IV solumedrol in the setting of not taking Envarsus  correctly (was switched in Oct). Multiple prior episodes of rejection on monthly IVIG at home. EF 45% which is stable.  FK subtherapeutic still, titrating dose, requiring high dose envarsus  and dilt booster AFERDITA  SPAHILLARI, MD  -------------------------------------------------------------------------------

## 2024-06-01 NOTE — Telephone Encounter (Signed)
 Patient left message over the weekend stating she would be out this week for cardiac rehab due to being hospitalized last week.

## 2024-06-03 ENCOUNTER — Encounter (HOSPITAL_COMMUNITY)

## 2024-06-05 ENCOUNTER — Encounter (HOSPITAL_COMMUNITY)

## 2024-06-08 ENCOUNTER — Telehealth (HOSPITAL_COMMUNITY): Payer: Self-pay | Admitting: *Deleted

## 2024-06-08 ENCOUNTER — Encounter (HOSPITAL_COMMUNITY)
Admission: RE | Admit: 2024-06-08 | Discharge: 2024-06-08 | Disposition: A | Discharge status: Home / Self Care | Source: Ambulatory Visit | Attending: Cardiology | Admitting: Cardiology

## 2024-06-08 ENCOUNTER — Ambulatory Visit: Payer: Self-pay | Admitting: Cardiology

## 2024-06-08 DIAGNOSIS — Z941 Heart transplant status: Secondary | ICD-10-CM | POA: Diagnosis present

## 2024-06-08 LAB — GLUCOSE, CAPILLARY
Glucose-Capillary: 151 mg/dL — ABNORMAL HIGH (ref 70–99)
Glucose-Capillary: 97 mg/dL (ref 70–99)
Glucose-Capillary: 114 mg/dL — ABNORMAL HIGH (ref 70–99)

## 2024-06-08 NOTE — Progress Notes (Signed)
 Daily Session Note  Patient Details  Name: Lynn Estrada MRN: [id number] Date of Birth: December 01, 1960 Referring Provider:   Flowsheet Row INTENSIVE CARDIAC REHAB ORIENT from 05/21/2024 in [facility]  Referring Provider Dr. Arnie (Wilbert Bihari, MD)    Encounter Date: 06/08/2024  Check In:  Session Check In - 06/08/24 1043       Check-In   Supervising physician immediately available to respond to emergencies [facility] MD immediately available    Physician(s) Rosaline Bane NP    Location [facility]    Staff Present Alm Parkins, MS, ACSM-CEP, CCRP, Exercise Physiologist;Jetta Vannie BS, ACSM-CEP, Exercise Physiologist;Norma Ignasiak, RN, Avonne Gal, MS, ACSM-CEP, Exercise Physiologist;Joseph Lennon, RN, BSN    Virtual Visit No    Medication changes reported     No    Fall or balance concerns reported    No    Tobacco Cessation No Change    Warm-up and Cool-down Performed as group-led instruction    Resistance Training Performed Yes    VAD Patient? No    PAD/SET Patient? No      Pain Assessment   Currently in Pain? No/denies    Pain Score 0-No pain    Multiple Pain Sites No          Capillary Blood Glucose: Results for orders placed or performed during the hospital encounter of 06/08/24 (from the past 24 hours)  Glucose, capillary     Status: Abnormal   Collection Time: 06/08/24 10:35 AM  Result Value Ref Range   Glucose-Capillary 151 (H) 70 - 99 mg/dL  Glucose, capillary     Status: None   Collection Time: 06/08/24 11:28 AM  Result Value Ref Range   Glucose-Capillary 97 70 - 99 mg/dL  Glucose, capillary     Status: Abnormal   Collection Time: 06/08/24 11:48 AM  Result Value Ref Range   Glucose-Capillary 114 (H) 70 - 99 mg/dL     Exercise Prescription Changes - 06/08/24 1037       Response to Exercise   Blood Pressure (Admit) 124/72    Blood Pressure (Exercise) 130/80     Blood Pressure (Exit) 112/70    Heart Rate (Admit) 97 bpm    Heart Rate (Exercise) 112 bpm    Heart Rate (Exit) 99 bpm    Rating of Perceived Exertion (Exercise) 11    Symptoms None    Comments Off to a good start with exercise.    Duration Continue with 30 min of aerobic exercise without signs/symptoms of physical distress.    Intensity THRR unchanged      Progression   Progression Continue to progress workloads to maintain intensity without signs/symptoms of physical distress.    Average METs 2      Resistance Training   Training Prescription Yes    Weight 2 lbs    Reps 10-15    Time 5 Minutes      Interval Training   Interval Training No      NuStep   Level 1    SPM 74    Minutes 15    METs 1.9      Track   Laps 8    Minutes 15    METs 2.02          Social History   Tobacco Use  Smoking Status Former   Current packs/day: 0.00   Average packs/day: 0.8 packs/day for 5.0 years (3.8 ttl pk-yrs)  Types: Cigarettes   Start date: 01/11/1992   Quit date: 01/10/1997   Years since quitting: 27.4  Smokeless Tobacco Never    Goals Met:  Exercise tolerated well No report of concerns or symptoms today Strength training completed today  Goals Unmet:  Not Applicable  Comments: Pt started cardiac rehab today.  Pt tolerated light exercise without difficulty. VSS, telemetry-Sinus Rhythm, asymptomatic.  Medication list reconciled. Pt denies barriers to medicaiton compliance.  PSYCHOSOCIAL ASSESSMENT:  PHQ-5. Pt exhibits positive coping skills, hopeful outlook with supportive family. No psychosocial needs identified at this time, no psychosocial interventions necessary.    Pt enjoys shopping and watching TV.   Pt oriented to exercise equipment and routine.    Understanding verbalized.Hadassah Elpidio Quan RN BSN    Dr. Wilbert Bihari is Medical Director for Cardiac Rehab at [facility].

## 2024-06-08 NOTE — Telephone Encounter (Signed)
Left message to cardiac rehab.Harrell Gave RN BSN

## 2024-06-08 NOTE — Progress Notes (Signed)
 Spoke with Transplant coordinator  Brewster Jordan at [facility]. Lynn Estrada was discharged on 06/02/24 due to transplant rejection. Darryle said that the patient is okay to proceed with group exercise at cardiac rehab.Will continue to monitor the patient throughout  the program.Judiann Celia Elpidio Quan RN BSN

## 2024-06-10 ENCOUNTER — Ambulatory Visit: Payer: Self-pay | Admitting: Cardiology

## 2024-06-10 ENCOUNTER — Encounter (HOSPITAL_COMMUNITY): Admission: RE | Admit: 2024-06-10 | Discharge: 2024-06-10 | Attending: Cardiology

## 2024-06-10 DIAGNOSIS — Z941 Heart transplant status: Secondary | ICD-10-CM | POA: Diagnosis not present

## 2024-06-10 LAB — GLUCOSE, CAPILLARY: Glucose-Capillary: 118 mg/dL — ABNORMAL HIGH (ref 70–99)

## 2024-06-12 ENCOUNTER — Encounter (HOSPITAL_COMMUNITY)

## 2024-06-15 ENCOUNTER — Encounter (HOSPITAL_COMMUNITY)

## 2024-06-15 ENCOUNTER — Telehealth (HOSPITAL_COMMUNITY): Payer: Self-pay

## 2024-06-15 NOTE — Telephone Encounter (Signed)
 Patient c/o for 10:15 CR class due to low blood sugar.

## 2024-06-17 ENCOUNTER — Encounter (HOSPITAL_COMMUNITY): Admission: RE | Admit: 2024-06-17 | Discharge: 2024-06-17 | Attending: Cardiology

## 2024-06-17 DIAGNOSIS — Z941 Heart transplant status: Secondary | ICD-10-CM

## 2024-06-19 ENCOUNTER — Encounter (HOSPITAL_COMMUNITY): Admission: RE | Admit: 2024-06-19 | Discharge: 2024-06-19 | Attending: Cardiology

## 2024-06-19 DIAGNOSIS — Z941 Heart transplant status: Secondary | ICD-10-CM

## 2024-06-22 ENCOUNTER — Encounter (HOSPITAL_COMMUNITY)

## 2024-06-22 ENCOUNTER — Telehealth (HOSPITAL_COMMUNITY): Payer: Self-pay

## 2024-06-22 NOTE — Telephone Encounter (Signed)
 Patient c/o for 10:15 CR class, states she has a conflicting appt this morning.

## 2024-06-24 ENCOUNTER — Encounter (HOSPITAL_COMMUNITY): Admission: RE | Admit: 2024-06-24 | Source: Ambulatory Visit

## 2024-06-26 ENCOUNTER — Encounter (HOSPITAL_COMMUNITY)

## 2024-06-29 ENCOUNTER — Telehealth (HOSPITAL_COMMUNITY): Payer: Self-pay | Admitting: *Deleted

## 2024-06-29 ENCOUNTER — Encounter (HOSPITAL_COMMUNITY): Admission: RE | Admit: 2024-06-29 | Source: Ambulatory Visit

## 2024-06-29 NOTE — Telephone Encounter (Signed)
 Pt LVM stating she was sick and could not attend CR today.  Aliene Aris BS, ACSM-CEP 06/29/2024 12:04 PM

## 2024-06-30 NOTE — Progress Notes (Signed)
 Cardiac Individual Treatment Plan  Patient Details  Name: Lynn Estrada MRN: [id number] Date of Birth: 1961/03/17 Referring Provider:   Flowsheet Row INTENSIVE CARDIAC REHAB ORIENT from 05/21/2024 in [facility]  Referring Provider Dr. Arnie (Wilbert Bihari, MD)    Initial Encounter Date:  Flowsheet Row INTENSIVE CARDIAC REHAB ORIENT from 05/21/2024 in [facility], [facility], [facility]  Date 05/21/24    Visit Diagnosis: Heart transplant recipient [facility])  Patient's Home Medications on Admission: Current Medications[1]  Past Medical History: Past Medical History:  Diagnosis Date   Anemia    Arthritis    CHF (congestive heart failure) (HCC) 12/06/2022   EF is 20% to 25 %   COPD (chronic obstructive pulmonary disease) (HCC)    Diabetes mellitus without complication (HCC)    Eczema    GERD (gastroesophageal reflux disease)    History of adenomatous polyp of colon    08-03-2016  tubular adenoma x2 and hyperplastic   History of chronic gastritis 08/03/2016   History of ectopic pregnancy 1988   s/p  left salpingectomy   History of endometriosis    History of kidney stones    History of partial nephrectomy    right pelvis for very large stone   History of pneumonia 07/02/2016   CAP   History of sepsis    07-01-2016  sepsis w/ pyelonephritis, CAP /   12-31-2016  urosepsis w/ kidney stone obstruction   History of uterine leiomyoma    Hx of adenomatous colonic polyps 08/03/2016   2 adenomas, 1 hpp and 1 lost polyp 2018 all < 1 cm    Hyperlipidemia    Hypertension    Left ureteral stone    Myocardial infarction [facility])    Nephrolithiasis    left obstructive stone and right non-obstructive stone  per CT 12-31-2016   Urgency of urination     Tobacco Use: Tobacco Use History[2]  Labs: Review Flowsheet  More data exists      Latest Ref Rng & Units 10/11/2023 11/18/2023  11/20/2023 11/21/2023 01/29/2024  Labs for ITP Cardiac and Pulmonary Rehab  Bicarbonate 20.0 - 28.0 mmol/L 25.8  25.9  27.2  26.5  - - 26.7  26.1   TCO2 22 - 32 mmol/L 27  27  28  28   - - 28  27   O2 Saturation % 60  60  45  45  53.1  63.2  47  46     Details       Multiple values from one day are sorted in reverse-chronological order         Capillary Blood Glucose: Lab Results  Component Value Date   GLUCAP 118 (H) 06/10/2024   GLUCAP 114 (H) 06/08/2024   GLUCAP 97 06/08/2024   GLUCAP 151 (H) 06/08/2024   GLUCAP 105 (H) 01/29/2024     Exercise Target Goals: Exercise Program Goal: Individual exercise prescription set using results from initial 6 min walk test and THRR while considering  patients activity barriers and safety.   Exercise Prescription Goal: Initial exercise prescription builds to 30-45 minutes a day of aerobic activity, 2-3 days per week.  Home exercise guidelines will be given to patient during program as part of exercise prescription that the participant will acknowledge.  Activity Barriers & Risk Stratification:  Activity Barriers & Cardiac Risk Stratification - 05/21/24 1204       Activity Barriers & Cardiac Risk Stratification  Activity Barriers Back Problems;Neck/Spine Problems;Joint Problems;Deconditioning;Muscular Weakness;Balance Concerns;Assistive Device    Cardiac Risk Stratification High          6 Minute Walk:  6 Minute Walk     Row Name 05/21/24 1352         6 Minute Walk   Phase Initial     Distance 952 feet     Walk Time 6 minutes     # of Rest Breaks 0     MPH 1.8     METS 3.3     RPE 10     Perceived Dyspnea  0     VO2 Peak 11.4     Symptoms No     Resting HR 89 bpm     Resting BP 130/80     Resting Oxygen Saturation  97 %     Exercise Oxygen Saturation  during 6 min walk 98 %     Max Ex. HR 105 bpm     Max Ex. BP 138/80     2 Minute Post BP 134/78        Oxygen Initial Assessment:   Oxygen  Re-Evaluation:   Oxygen Discharge (Final Oxygen Re-Evaluation):   Initial Exercise Prescription:  Initial Exercise Prescription - 05/21/24 1300       Date of Initial Exercise RX and Referring Provider   Date 05/21/24    Referring Provider Dr. Arnie Earley Bihari, MD)    Expected Discharge Date 08/12/24      NuStep   Level 1    SPM 75    Minutes 15    METs 3.3      Track   Laps 8    Minutes 15    METs 3.3      Prescription Details   Frequency (times per week) 3    Duration Progress to 30 minutes of continuous aerobic without signs/symptoms of physical distress      Intensity   THRR 40-80% of Max Heartrate 63-126    Ratings of Perceived Exertion 11-13    Perceived Dyspnea 0-4      Progression   Progression Continue progressive overload as per policy without signs/symptoms or physical distress.      Resistance Training   Training Prescription Yes    Weight 2 lbs    Reps 10-15          Perform Capillary Blood Glucose checks as needed.  Exercise Prescription Changes:   Exercise Prescription Changes     Row Name 06/08/24 1037 06/19/24 1001           Response to Exercise   Blood Pressure (Admit) 124/72 102/58      Blood Pressure (Exercise) 130/80 114/60      Blood Pressure (Exit) 112/70 118/82      Heart Rate (Admit) 97 bpm 102 bpm      Heart Rate (Exercise) 112 bpm 108 bpm      Heart Rate (Exit) 99 bpm 102 bpm      Rating of Perceived Exertion (Exercise) 11 9      Symptoms None None      Comments Off to a good start with exercise. --      Duration Continue with 30 min of aerobic exercise without signs/symptoms of physical distress. Continue with 30 min of aerobic exercise without signs/symptoms of physical distress.      Intensity THRR unchanged THRR unchanged        Progression   Progression Continue to progress workloads to maintain  intensity without signs/symptoms of physical distress. Continue to progress workloads to maintain intensity without  signs/symptoms of physical distress.      Average METs 2 2.3        Resistance Training   Training Prescription Yes Yes      Weight 2 lbs 2 lbs      Reps 10-15 10-15      Time 5 Minutes 5 Minutes        Interval Training   Interval Training No No        NuStep   Level 1 1      SPM 74 77      Minutes 15 15      METs 1.9 2        Track   Laps 8 12      Minutes 15 15      METs 2.02 2.53         Exercise Comments:   Exercise Comments     Row Name 06/08/24 1420           Exercise Comments Patient tolerated low intensity exercise well without symptoms. Oriented her to the exercise equipment and stretching routine. Stretching handout given.          Exercise Goals and Review:   Exercise Goals     Row Name 05/21/24 1108             Exercise Goals   Increase Physical Activity Yes       Intervention Develop an individualized exercise prescription for aerobic and resistive training based on initial evaluation findings, risk stratification, comorbidities and participant's personal goals.;Provide advice, education, support and counseling about physical activity/exercise needs.       Expected Outcomes Short Term: Attend rehab on a regular basis to increase amount of physical activity.;Long Term: Add in home exercise to make exercise part of routine and to increase amount of physical activity.;Long Term: Exercising regularly at least 3-5 days a week.       Increase Strength and Stamina Yes       Intervention Provide advice, education, support and counseling about physical activity/exercise needs.;Develop an individualized exercise prescription for aerobic and resistive training based on initial evaluation findings, risk stratification, comorbidities and participant's personal goals.       Expected Outcomes Short Term: Increase workloads from initial exercise prescription for resistance, speed, and METs.;Short Term: Perform resistance training exercises routinely during rehab  and add in resistance training at home;Long Term: Improve cardiorespiratory fitness, muscular endurance and strength as measured by increased METs and functional capacity ( )       Able to understand and use rate of perceived exertion (RPE) scale Yes       Intervention Provide education and explanation on how to use RPE scale       Expected Outcomes Short Term: Able to use RPE daily in rehab to express subjective intensity level;Long Term:  Able to use RPE to guide intensity level when exercising independently       Knowledge and understanding of Target Heart Rate Range (THRR) Yes       Intervention Provide education and explanation of THRR including how the numbers were predicted and where they are located for reference       Expected Outcomes Short Term: Able to state/look up THRR;Short Term: Able to use daily as guideline for intensity in rehab;Long Term: Able to use THRR to govern intensity when exercising independently       Understanding of Exercise Prescription  Yes       Intervention Provide education, explanation, and written materials on patient's individual exercise prescription       Expected Outcomes Short Term: Able to explain program exercise prescription;Long Term: Able to explain home exercise prescription to exercise independently          Exercise Goals Re-Evaluation :  Exercise Goals Re-Evaluation     Row Name 06/08/24 1420 06/30/24 1027           Exercise Goal Re-Evaluation   Exercise Goals Review Increase Physical Activity;Increase Strength and Stamina;Able to understand and use rate of perceived exertion (RPE) scale Increase Physical Activity;Increase Strength and Stamina;Able to understand and use rate of perceived exertion (RPE) scale      Comments Patient is able to understand and use RPE scale appropriately. Macario has sporadic attendance. Increasing workloads as able.      Expected Outcomes Progress workloads as tolerated to help increase strength and stamina.  Progress workloads as tolerated to help increase strength and stamina.         Discharge Exercise Prescription (Final Exercise Prescription Changes):  Exercise Prescription Changes - 06/19/24 1001       Response to Exercise   Blood Pressure (Admit) 102/58    Blood Pressure (Exercise) 114/60    Blood Pressure (Exit) 118/82    Heart Rate (Admit) 102 bpm    Heart Rate (Exercise) 108 bpm    Heart Rate (Exit) 102 bpm    Rating of Perceived Exertion (Exercise) 9    Symptoms None    Duration Continue with 30 min of aerobic exercise without signs/symptoms of physical distress.    Intensity THRR unchanged      Progression   Progression Continue to progress workloads to maintain intensity without signs/symptoms of physical distress.    Average METs 2.3      Resistance Training   Training Prescription Yes    Weight 2 lbs    Reps 10-15    Time 5 Minutes      Interval Training   Interval Training No      NuStep   Level 1    SPM 77    Minutes 15    METs 2      Track   Laps 12    Minutes 15    METs 2.53          Nutrition:  Target Goals: Understanding of nutrition guidelines, daily intake of sodium 1500mg , cholesterol 200mg , calories 30% from fat and 7% or less from saturated fats, daily to have 5 or more servings of fruits and vegetables.  Biometrics:  Pre Biometrics - 05/21/24 1050       Pre Biometrics   Waist Circumference 37.5 inches    Hip Circumference 40 inches    Waist to Hip Ratio 0.94 %    Triceps Skinfold 14 mm    % Body Fat 32.4 %    Grip Strength 18 kg    Flexibility --   pt declined test   Single Leg Stand 4.12 seconds           Nutrition Therapy Plan and Nutrition Goals:  Nutrition Therapy & Goals - 06/08/24 1146       Nutrition Therapy   Diet Heart Healthy, Low Sodium, Diabetic    Drug/Food Interactions Statins/Certain Fruits      Personal Nutrition Goals   Nutrition Goal Patient to identify strategies for reducing cardiovascular risk  by attending the Pritikin education and nutrition series weekly.  Personal Goal #2 Patient to identify strategies for blood sugar control with goal A1c <7%.    Comments Pt with medical history significant for NICM s/p OHT 02/13/2024, significant rejection history 02/21/2024, HTN, dyslipidemia, T2DM, stage III CKD. Pt endorses following low sodium, heart healthy, diabetic diet. Nutritionally pertinent labs on 06/02/2024 include BUN 35, Cr 1.4, eGFR 43; elevated A1c of 8% on 04/25/2024. Pt states blood glucose stays mostly within appropriate range; attributes elevated blood glucose to steroids. Patient will benefit from participation in intensive cardiac rehab for nutrition education, exercise, and lifestyle modification.      Intervention Plan   Intervention Prescribe, educate and counsel regarding individualized specific dietary modifications aiming towards targeted core components such as weight, hypertension, lipid management, diabetes, heart failure and other comorbidities.    Expected Outcomes Short Term Goal: Understand basic principles of dietary content, such as calories, fat, sodium, cholesterol and nutrients.;Long Term Goal: Adherence to prescribed nutrition plan.          Nutrition Assessments:  MEDIFICTS Score Key: >=70 Need to make dietary changes  40-70 Heart Healthy Diet <= 40 Therapeutic Level Cholesterol Diet   Flowsheet Row CARDIAC REHAB PHASE II EXERCISE from 03/16/2022 in [facility]  Picture Your Plate Total Score on Discharge 72   Picture Your Plate Scores: <59 Unhealthy dietary pattern with much room for improvement. 41-50 Dietary pattern unlikely to meet recommendations for good health and room for improvement. 51-60 More healthful dietary pattern, with some room for improvement.  >60 Healthy dietary pattern, although there may be some specific behaviors that could be improved.    Nutrition Goals  Re-Evaluation:   Nutrition Goals Re-Evaluation:   Nutrition Goals Discharge (Final Nutrition Goals Re-Evaluation):   Psychosocial: Target Goals: Acknowledge presence or absence of significant depression and/or stress, maximize coping skills, provide positive support system. Participant is able to verbalize types and ability to use techniques and skills needed for reducing stress and depression.  Initial Review & Psychosocial Screening:  Initial Psych Review & Screening - 05/21/24 1116       Initial Review   Current issues with Current Sleep Concerns;Current Stress Concerns;Current Anxiety/Panic    Source of Stress Concerns Chronic Illness    Comments Will continue to monitor and offer support as needed      Family Dynamics   Good Support System? Yes   Pt has her spouse     Barriers   Psychosocial barriers to participate in program The patient should benefit from training in stress management and relaxation.      Screening Interventions   Interventions Encouraged to exercise;Provide feedback about the scores to participant    Expected Outcomes Short Term goal: Identification and review with participant of any Quality of Life or Depression concerns found by scoring the questionnaire.;Long Term Goal: Stressors or current issues are controlled or eliminated.;Long Term goal: The participant improves quality of Life and PHQ9 Scores as seen by post scores and/or verbalization of changes          Quality of Life Scores:  Quality of Life - 05/21/24 1358       Quality of Life   Select Quality of Life      Quality of Life Scores   Health/Function Pre 24.29 %    Socioeconomic Pre 28.33 %    Psych/Spiritual Pre 28.29 %    Family Pre 27.6 %    GLOBAL Pre 26.44 %  Scores of 19 and below usually indicate a poorer quality of life in these areas.  A difference of  2-3 points is a clinically meaningful difference.  A difference of 2-3 points in the total score of the Quality  of Life Index has been associated with significant improvement in overall quality of life, self-image, physical symptoms, and general health in studies assessing change in quality of life.  PHQ-9: Review Flowsheet  More data exists      05/21/2024 03/16/2022 01/11/2022 10/29/2017 09/11/2017  Depression screen PHQ 2/9  Decreased Interest 0 0 0 0 0  Down, Depressed, Hopeless 0 0 1 0 0  PHQ - 2 Score 0 0 1 0 0  Altered sleeping 2 - 0 - -  Tired, decreased energy 2 - 1 - -  Change in appetite 1 - 0 - -  Feeling bad or failure about yourself  0 - 0 - -  Trouble concentrating 0 - 0 - -  Moving slowly or fidgety/restless 0 - 0 - -  Suicidal thoughts 0 - 0 - -  PHQ-9 Score 5 - 2  - -  Difficult doing work/chores Somewhat difficult - Somewhat difficult - -    Details       Data saved with a previous flowsheet row definition        Interpretation of Total Score  Total Score Depression Severity:  1-4 = Minimal depression, 5-9 = Mild depression, 10-14 = Moderate depression, 15-19 = Moderately severe depression, 20-27 = Severe depression   Psychosocial Evaluation and Intervention:   Psychosocial Re-Evaluation:  Psychosocial Re-Evaluation     Row Name 06/08/24 1642 06/30/24 1305           Psychosocial Re-Evaluation   Current issues with Current Sleep Concerns;Current Stress Concerns;Current Anxiety/Panic Current Sleep Concerns;Current Stress Concerns;Current Anxiety/Panic      Comments Offie started cardiac rehab on 06/08/24. Jaiyah did not voice any increased concerns or stressors on her first day of exercise. Will review PHQ9 in the upcoming week. Daneli started cardiac rehab on 06/08/24. Akila has not voiced any increased concerns or stressors during exercise at cardiac rehab . Will review PHQ9 in the near future. Juan has only been to a few sessions      Expected Outcomes Riva will have deecreased or controlled stressors upon compleiton of cardiac rehab Maddalyn  will have deecreased or controlled stressors upon compleiton of cardiac rehab      Interventions Stress management education;Relaxation education;Encouraged to attend Cardiac Rehabilitation for the exercise Stress management education;Relaxation education;Encouraged to attend Cardiac Rehabilitation for the exercise      Continue Psychosocial Services  Follow up required by staff Follow up required by staff        Initial Review   Source of Stress Concerns Chronic Illness Chronic Illness      Comments Will continue to monitor and offer support as needed Will continue to monitor and offer support as needed         Psychosocial Discharge (Final Psychosocial Re-Evaluation):  Psychosocial Re-Evaluation - 06/30/24 1305       Psychosocial Re-Evaluation   Current issues with Current Sleep Concerns;Current Stress Concerns;Current Anxiety/Panic    Comments Dhruvi started cardiac rehab on 06/08/24. Dandrea has not voiced any increased concerns or stressors during exercise at cardiac rehab . Will review PHQ9 in the near future. Larrisha has only been to a few sessions    Expected Outcomes Danyel will have deecreased or controlled stressors upon compleiton of cardiac rehab  Interventions Stress management education;Relaxation education;Encouraged to attend Cardiac Rehabilitation for the exercise    Continue Psychosocial Services  Follow up required by staff      Initial Review   Source of Stress Concerns Chronic Illness    Comments Will continue to monitor and offer support as needed          Vocational Rehabilitation: Provide vocational rehab assistance to qualifying candidates.   Vocational Rehab Evaluation & Intervention:  Vocational Rehab - 05/21/24 1118       Initial Vocational Rehab Evaluation & Intervention   Assessment shows need for Vocational Rehabilitation --   Pt is retired         Education: Education Goals: Education classes will be provided on a weekly basis,  covering required topics. Participant will state understanding/return demonstration of topics presented.    Education     Row Name 06/08/24 1000     Education   Cardiac Education Topics --   Select --     Core Videos   Educator --   Select --   Exercise Education --   Instruction Review Code --    Row Name 06/10/24 1100     Education   Cardiac Education Topics Pritikin   Orthoptist   Educator Dietitian   Weekly Topic Fast Evening Meals   Instruction Review Code 1- Verbalizes Understanding   Class Start Time 1148   Class Stop Time 1232   Class Time Calculation (min) 44 min    Row Name 06/17/24 1000     Education   Cardiac Education Topics Pritikin   Customer Service Manager   Weekly Topic International Cuisine- Spotlight on the [employer] Zones   Instruction Review Code 1- Verbalizes Understanding   Class Start Time 1145   Class Stop Time 1224   Class Time Calculation (min) 39 min      Core Videos: Exercise    Move It!  Clinical staff conducted group or individual video education with verbal and written material and guidebook.  Patient learns the recommended Pritikin exercise program. Exercise with the goal of living a long, healthy life. Some of the health benefits of exercise include controlled diabetes, healthier blood pressure levels, improved cholesterol levels, improved heart and lung capacity, improved sleep, and better body composition. Everyone should speak with their doctor before starting or changing an exercise routine.  Biomechanical Limitations Clinical staff conducted group or individual video education with verbal and written material and guidebook.  Patient learns how biomechanical limitations can impact exercise and how we can mitigate and possibly overcome limitations to have an impactful and balanced exercise routine.  Body Composition Clinical staff conducted group or individual  video education with verbal and written material and guidebook.  Patient learns that body composition (ratio of muscle mass to fat mass) is a key component to assessing overall fitness, rather than body weight alone. Increased fat mass, especially visceral belly fat, can put us  at increased risk for metabolic syndrome, type 2 diabetes, heart disease, and even death. It is recommended to combine diet and exercise (cardiovascular and resistance training) to improve your body composition. Seek guidance from your physician and exercise physiologist before implementing an exercise routine.  Exercise Action Plan Clinical staff conducted group or individual video education with verbal and written material and guidebook.  Patient learns the recommended strategies to achieve and enjoy long-term exercise adherence, including variety, self-motivation, self-efficacy,  and positive decision making. Benefits of exercise include fitness, good health, weight management, more energy, better sleep, less stress, and overall well-being.  Medical   Heart Disease Risk Reduction Clinical staff conducted group or individual video education with verbal and written material and guidebook.  Patient learns our heart is our most vital organ as it circulates oxygen, nutrients, white blood cells, and hormones throughout the entire body, and carries waste away. Data supports a plant-based eating plan like the Pritikin Program for its effectiveness in slowing progression of and reversing heart disease. The video provides a number of recommendations to address heart disease.   Metabolic Syndrome and Belly Fat  Clinical staff conducted group or individual video education with verbal and written material and guidebook.  Patient learns what metabolic syndrome is, how it leads to heart disease, and how one can reverse it and keep it from coming back. You have metabolic syndrome if you have 3 of the following 5 criteria: abdominal obesity,  high blood pressure, high triglycerides, low HDL cholesterol, and high blood sugar.  Hypertension and Heart Disease Clinical staff conducted group or individual video education with verbal and written material and guidebook.  Patient learns that high blood pressure, or hypertension, is very common in the [place] . Hypertension is largely due to excessive salt intake, but other important risk factors include being overweight, physical inactivity, drinking too much alcohol, smoking, and not eating enough potassium from fruits and vegetables. High blood pressure is a leading risk factor for heart attack, stroke, congestive heart failure, dementia, kidney failure, and premature death. Long-term effects of excessive salt intake include stiffening of the arteries and thickening of heart muscle and organ damage. Recommendations include ways to reduce hypertension and the risk of heart disease.  Diseases of Our Time - Focusing on Diabetes Clinical staff conducted group or individual video education with verbal and written material and guidebook.  Patient learns why the best way to stop diseases of our time is prevention, through food and other lifestyle changes. Medicine (such as prescription pills and surgeries) is often only a Band-Aid on the problem, not a long-term solution. Most common diseases of our time include obesity, type 2 diabetes, hypertension, heart disease, and cancer. The Pritikin Program is recommended and has been proven to help reduce, reverse, and/or prevent the damaging effects of metabolic syndrome.  Nutrition   Overview of the Pritikin Eating Plan  Clinical staff conducted group or individual video education with verbal and written material and guidebook.  Patient learns about the Pritikin Eating Plan for disease risk reduction. The Pritikin Eating Plan emphasizes a wide variety of unrefined, minimally-processed carbohydrates, like fruits, vegetables, whole grains, and legumes. Go,  Caution, and Stop food choices are explained. Plant-based and lean animal proteins are emphasized. Rationale provided for low sodium intake for blood pressure control, low added sugars for blood sugar stabilization, and low added fats and oils for coronary artery disease risk reduction and weight management.  Calorie Density  Clinical staff conducted group or individual video education with verbal and written material and guidebook.  Patient learns about calorie density and how it impacts the Pritikin Eating Plan. Knowing the characteristics of the food you choose will help you decide whether those foods will lead to weight gain or weight loss, and whether you want to consume more or less of them. Weight loss is usually a side effect of the Pritikin Eating Plan because of its focus on low calorie-dense foods.  Label Reading  Clinical staff  conducted group or individual video education with verbal and written material and guidebook.  Patient learns about the Pritikin recommended label reading guidelines and corresponding recommendations regarding calorie density, added sugars, sodium content, and whole grains.  Dining Out - Part 1  Clinical staff conducted group or individual video education with verbal and written material and guidebook.  Patient learns that restaurant meals can be sabotaging because they can be so high in calories, fat, sodium, and/or sugar. Patient learns recommended strategies on how to positively address this and avoid unhealthy pitfalls.  Facts on Fats  Clinical staff conducted group or individual video education with verbal and written material and guidebook.  Patient learns that lifestyle modifications can be just as effective, if not more so, as many medications for lowering your risk of heart disease. A Pritikin lifestyle can help to reduce your risk of inflammation and atherosclerosis (cholesterol build-up, or plaque, in the artery walls). Lifestyle interventions such as  dietary choices and physical activity address the cause of atherosclerosis. A review of the types of fats and their impact on blood cholesterol levels, along with dietary recommendations to reduce fat intake is also included.  Nutrition Action Plan  Clinical staff conducted group or individual video education with verbal and written material and guidebook.  Patient learns how to incorporate Pritikin recommendations into their lifestyle. Recommendations include planning and keeping personal health goals in mind as an important part of their success.  Healthy Mind-Set    Healthy Minds, Bodies, Hearts  Clinical staff conducted group or individual video education with verbal and written material and guidebook.  Patient learns how to identify when they are stressed. Video will discuss the impact of that stress, as well as the many benefits of stress management. Patient will also be introduced to stress management techniques. The way we think, act, and feel has an impact on our hearts.  How Our Thoughts Can Heal Our Hearts  Clinical staff conducted group or individual video education with verbal and written material and guidebook.  Patient learns that negative thoughts can cause depression and anxiety. This can result in negative lifestyle behavior and serious health problems. Cognitive behavioral therapy is an effective method to help control our thoughts in order to change and improve our emotional outlook.  Additional Videos:  Exercise    Improving Performance  Clinical staff conducted group or individual video education with verbal and written material and guidebook.  Patient learns to use a non-linear approach by alternating intensity levels and lengths of time spent exercising to help burn more calories and lose more body fat. Cardiovascular exercise helps improve heart health, metabolism, hormonal balance, blood sugar control, and recovery from fatigue. Resistance training improves strength,  endurance, balance, coordination, reaction time, metabolism, and muscle mass. Flexibility exercise improves circulation, posture, and balance. Seek guidance from your physician and exercise physiologist before implementing an exercise routine and learn your capabilities and proper form for all exercise.  Introduction to Yoga  Clinical staff conducted group or individual video education with verbal and written material and guidebook.  Patient learns about yoga, a discipline of the coming together of mind, breath, and body. The benefits of yoga include improved flexibility, improved range of motion, better posture and core strength, increased lung function, weight loss, and positive self-image. Yogas heart health benefits include lowered blood pressure, healthier heart rate, decreased cholesterol and triglyceride levels, improved immune function, and reduced stress. Seek guidance from your physician and exercise physiologist before implementing an exercise routine and learn  your capabilities and proper form for all exercise.  Medical   Aging: Enhancing Your Quality of Life  Clinical staff conducted group or individual video education with verbal and written material and guidebook.  Patient learns key strategies and recommendations to stay in good physical health and enhance quality of life, such as prevention strategies, having an advocate, securing a Health Care Proxy and Power of Attorney, and keeping a list of medications and system for tracking them. It also discusses how to avoid risk for bone loss.  Biology of Weight Control  Clinical staff conducted group or individual video education with verbal and written material and guidebook.  Patient learns that weight gain occurs because we consume more calories than we burn (eating more, moving less). Even if your body weight is normal, you may have higher ratios of fat compared to muscle mass. Too much body fat puts you at increased risk for  cardiovascular disease, heart attack, stroke, type 2 diabetes, and obesity-related cancers. In addition to exercise, following the Pritikin Eating Plan can help reduce your risk.  Decoding Lab Results  Clinical staff conducted group or individual video education with verbal and written material and guidebook.  Patient learns that lab test reflects one measurement whose values change over time and are influenced by many factors, including medication, stress, sleep, exercise, food, hydration, pre-existing medical conditions, and more. It is recommended to use the knowledge from this video to become more involved with your lab results and evaluate your numbers to speak with your doctor.   Diseases of Our Time - Overview  Clinical staff conducted group or individual video education with verbal and written material and guidebook.  Patient learns that according to the CDC, 50% to 70% of chronic diseases (such as obesity, type 2 diabetes, elevated lipids, hypertension, and heart disease) are avoidable through lifestyle improvements including healthier food choices, listening to satiety cues, and increased physical activity.  Sleep Disorders Clinical staff conducted group or individual video education with verbal and written material and guidebook.  Patient learns how good quality and duration of sleep are important to overall health and well-being. Patient also learns about sleep disorders and how they impact health along with recommendations to address them, including discussing with a physician.  Nutrition  Dining Out - Part 2 Clinical staff conducted group or individual video education with verbal and written material and guidebook.  Patient learns how to plan ahead and communicate in order to maximize their dining experience in a healthy and nutritious manner. Included are recommended food choices based on the type of restaurant the patient is visiting.   Fueling a Banker  conducted group or individual video education with verbal and written material and guidebook.  There is a strong connection between our food choices and our health. Diseases like obesity and type 2 diabetes are very prevalent and are in large-part due to lifestyle choices. The Pritikin Eating Plan provides plenty of food and hunger-curbing satisfaction. It is easy to follow, affordable, and helps reduce health risks.  Menu Workshop  Clinical staff conducted group or individual video education with verbal and written material and guidebook.  Patient learns that restaurant meals can sabotage health goals because they are often packed with calories, fat, sodium, and sugar. Recommendations include strategies to plan ahead and to communicate with the manager, chef, or server to help order a healthier meal.  Planning Your Eating Strategy  Clinical staff conducted group or individual video education with verbal and  written material and guidebook.  Patient learns about the Pritikin Eating Plan and its benefit of reducing the risk of disease. The Pritikin Eating Plan does not focus on calories. Instead, it emphasizes high-quality, nutrient-rich foods. By knowing the characteristics of the foods, we choose, we can determine their calorie density and make informed decisions.  Targeting Your Nutrition Priorities  Clinical staff conducted group or individual video education with verbal and written material and guidebook.  Patient learns that lifestyle habits have a tremendous impact on disease risk and progression. This video provides eating and physical activity recommendations based on your personal health goals, such as reducing LDL cholesterol, losing weight, preventing or controlling type 2 diabetes, and reducing high blood pressure.  Vitamins and Minerals  Clinical staff conducted group or individual video education with verbal and written material and guidebook.  Patient learns different ways to obtain  key vitamins and minerals, including through a recommended healthy diet. It is important to discuss all supplements you take with your doctor.   Healthy Mind-Set    Smoking Cessation  Clinical staff conducted group or individual video education with verbal and written material and guidebook.  Patient learns that cigarette smoking and tobacco addiction pose a serious health risk which affects millions of people. Stopping smoking will significantly reduce the risk of heart disease, lung disease, and many forms of cancer. Recommended strategies for quitting are covered, including working with your doctor to develop a successful plan.  Culinary   Becoming a Set Designer conducted group or individual video education with verbal and written material and guidebook.  Patient learns that cooking at home can be healthy, cost-effective, quick, and puts them in control. Keys to cooking healthy recipes will include looking at your recipe, assessing your equipment needs, planning ahead, making it simple, choosing cost-effective seasonal ingredients, and limiting the use of added fats, salts, and sugars.  Cooking - Breakfast and Snacks  Clinical staff conducted group or individual video education with verbal and written material and guidebook.  Patient learns how important breakfast is to satiety and nutrition through the entire day. Recommendations include key foods to eat during breakfast to help stabilize blood sugar levels and to prevent overeating at meals later in the day. Planning ahead is also a key component.  Cooking - Educational Psychologist conducted group or individual video education with verbal and written material and guidebook.  Patient learns eating strategies to improve overall health, including an approach to cook more at home. Recommendations include thinking of animal protein as a side on your plate rather than center stage and focusing instead on lower calorie  dense options like vegetables, fruits, whole grains, and plant-based proteins, such as beans. Making sauces in large quantities to freeze for later and leaving the skin on your vegetables are also recommended to maximize your experience.  Cooking - Healthy Salads and Dressing Clinical staff conducted group or individual video education with verbal and written material and guidebook.  Patient learns that vegetables, fruits, whole grains, and legumes are the foundations of the Pritikin Eating Plan. Recommendations include how to incorporate each of these in flavorful and healthy salads, and how to create homemade salad dressings. Proper handling of ingredients is also covered. Cooking - Soups and [employer] - Soups and Desserts Clinical staff conducted group or individual video education with verbal and written material and guidebook.  Patient learns that Pritikin soups and desserts make for easy, nutritious, and delicious snacks and  meal components that are low in sodium, fat, sugar, and calorie density, while high in vitamins, minerals, and filling fiber. Recommendations include simple and healthy ideas for soups and desserts.   Overview     The Pritikin Solution Program Overview Clinical staff conducted group or individual video education with verbal and written material and guidebook.  Patient learns that the results of the Pritikin Program have been documented in more than 100 articles published in peer-reviewed journals, and the benefits include reducing risk factors for (and, in some cases, even reversing) high cholesterol, high blood pressure, type 2 diabetes, obesity, and more! An overview of the three key pillars of the Pritikin Program will be covered: eating well, doing regular exercise, and having a healthy mind-set.  WORKSHOPS  Exercise: Exercise Basics: Building Your Action Plan Clinical staff led group instruction and group discussion with PowerPoint presentation and patient  guidebook. To enhance the learning environment the use of posters, models and videos may be added. At the conclusion of this workshop, patients will comprehend the difference between physical activity and exercise, as well as the benefits of incorporating both, into their routine. Patients will understand the FITT (Frequency, Intensity, Time, and Type) principle and how to use it to build an exercise action plan. In addition, safety concerns and other considerations for exercise and cardiac rehab will be addressed by the presenter. The purpose of this lesson is to promote a comprehensive and effective weekly exercise routine in order to improve patients overall level of fitness.   Managing Heart Disease: Your Path to a Healthier Heart Clinical staff led group instruction and group discussion with PowerPoint presentation and patient guidebook. To enhance the learning environment the use of posters, models and videos may be added.At the conclusion of this workshop, patients will understand the anatomy and physiology of the heart. Additionally, they will understand how Pritikins three pillars impact the risk factors, the progression, and the management of heart disease.  The purpose of this lesson is to provide a high-level overview of the heart, heart disease, and how the Pritikin lifestyle positively impacts risk factors.  Exercise Biomechanics Clinical staff led group instruction and group discussion with PowerPoint presentation and patient guidebook. To enhance the learning environment the use of posters, models and videos may be added. Patients will learn how the structural parts of their bodies function and how these functions impact their daily activities, movement, and exercise. Patients will learn how to promote a neutral spine, learn how to manage pain, and identify ways to improve their physical movement in order to promote healthy living. The purpose of this lesson is to expose patients  to common physical limitations that impact physical activity. Participants will learn practical ways to adapt and manage aches and pains, and to minimize their effect on regular exercise. Patients will learn how to maintain good posture while sitting, walking, and lifting.  Balance Training and Fall Prevention  Clinical staff led group instruction and group discussion with PowerPoint presentation and patient guidebook. To enhance the learning environment the use of posters, models and videos may be added. At the conclusion of this workshop, patients will understand the importance of their sensorimotor skills (vision, proprioception, and the vestibular system) in maintaining their ability to balance as they age. Patients will apply a variety of balancing exercises that are appropriate for their current level of function. Patients will understand the common causes for poor balance, possible solutions to these problems, and ways to modify their physical environment in order to  minimize their fall risk. The purpose of this lesson is to teach patients about the importance of maintaining balance as they age and ways to minimize their risk of falling.  WORKSHOPS   Nutrition:  Fueling a Ship Broker led group instruction and group discussion with PowerPoint presentation and patient guidebook. To enhance the learning environment the use of posters, models and videos may be added. Patients will review the foundational principles of the Pritikin Eating Plan and understand what constitutes a serving size in each of the food groups. Patients will also learn Pritikin-friendly foods that are better choices when away from home and review make-ahead meal and snack options. Calorie density will be reviewed and applied to three nutrition priorities: weight maintenance, weight loss, and weight gain. The purpose of this lesson is to reinforce (in a group setting) the key concepts around what patients are  recommended to eat and how to apply these guidelines when away from home by planning and selecting Pritikin-friendly options. Patients will understand how calorie density may be adjusted for different weight management goals.  Mindful Eating  Clinical staff led group instruction and group discussion with PowerPoint presentation and patient guidebook. To enhance the learning environment the use of posters, models and videos may be added. Patients will briefly review the concepts of the Pritikin Eating Plan and the importance of low-calorie dense foods. The concept of mindful eating will be introduced as well as the importance of paying attention to internal hunger signals. Triggers for non-hunger eating and techniques for dealing with triggers will be explored. The purpose of this lesson is to provide patients with the opportunity to review the basic principles of the Pritikin Eating Plan, discuss the value of eating mindfully and how to measure internal cues of hunger and fullness using the Hunger Scale. Patients will also discuss reasons for non-hunger eating and learn strategies to use for controlling emotional eating.  Targeting Your Nutrition Priorities Clinical staff led group instruction and group discussion with PowerPoint presentation and patient guidebook. To enhance the learning environment the use of posters, models and videos may be added. Patients will learn how to determine their genetic susceptibility to disease by reviewing their family history. Patients will gain insight into the importance of diet as part of an overall healthy lifestyle in mitigating the impact of genetics and other environmental insults. The purpose of this lesson is to provide patients with the opportunity to assess their personal nutrition priorities by looking at their family history, their own health history and current risk factors. Patients will also be able to discuss ways of prioritizing and modifying the Pritikin  Eating Plan for their highest risk areas  Menu  Clinical staff led group instruction and group discussion with PowerPoint presentation and patient guidebook. To enhance the learning environment the use of posters, models and videos may be added. Using menus brought in from [employer], or printed from [employer], patients will apply the Pritikin dining out guidelines that were presented in the [employer] video. Patients will also be able to practice these guidelines in a variety of provided scenarios. The purpose of this lesson is to provide patients with the opportunity to practice hands-on learning of the Pritikin Dining Out guidelines with actual menus and practice scenarios.  Label Reading Clinical staff led group instruction and group discussion with PowerPoint presentation and patient guidebook. To enhance the learning environment the use of posters, models and videos may be added. Patients will review and discuss  the Pritikin label reading guidelines presented in Pritikins Label Reading Educational series video. Using fool labels brought in from local grocery stores and markets, patients will apply the label reading guidelines and determine if the packaged food meet the Pritikin guidelines. The purpose of this lesson is to provide patients with the opportunity to review, discuss, and practice hands-on learning of the Pritikin Label Reading guidelines with actual packaged food labels. Cooking School  Pritikins [employer] are designed to teach patients ways to prepare quick, simple, and affordable recipes at home. The importance of nutritions role in chronic disease risk reduction is reflected in its emphasis in the overall Pritikin program. By learning how to prepare essential core Pritikin Eating Plan recipes, patients will increase control over what they eat; be able to customize the flavor of foods without the use of added salt, sugar, or fat; and  improve the quality of the food they consume. By learning a set of core recipes which are easily assembled, quickly prepared, and affordable, patients are more likely to prepare more healthy foods at home. These workshops focus on convenient breakfasts, simple entres, side dishes, and desserts which can be prepared with minimal effort and are consistent with nutrition recommendations for cardiovascular risk reduction. Cooking [employer] are taught by a armed forces logistics/support/administrative officer (RD) who has been trained by the [employer]. The chef or RD has a clear understanding of the importance of minimizing - if not completely eliminating - added fat, sugar, and sodium in recipes. Throughout the series of Cooking School Workshop sessions, patients will learn about healthy ingredients and efficient methods of cooking to build confidence in their capability to prepare    Cooking School weekly topics:  Adding Flavor- Sodium-Free  Fast and Healthy Breakfasts  Powerhouse Plant-Based Proteins  Satisfying Salads and Dressings  Simple Sides and Sauces  International Cuisine-Spotlight on the [employer] Zones  Delicious Desserts  Savory Soups  [employer] - Meals in a Astronomer Appetizers and Snacks  Comforting Weekend Breakfasts  One-Pot Wonders   Fast Evening Meals  Landscape Architect Your Pritikin Plate  WORKSHOPS   Healthy Mindset (Psychosocial):  Focused Goals, Sustainable Changes Clinical staff led group instruction and group discussion with PowerPoint presentation and patient guidebook. To enhance the learning environment the use of posters, models and videos may be added. Patients will be able to apply effective goal setting strategies to establish at least one personal goal, and then take consistent, meaningful action toward that goal. They will learn to identify common barriers to achieving personal goals and develop strategies to overcome them. Patients will  also gain an understanding of how our mind-set can impact our ability to achieve goals and the importance of cultivating a positive and growth-oriented mind-set. The purpose of this lesson is to provide patients with a deeper understanding of how to set and achieve personal goals, as well as the tools and strategies needed to overcome common obstacles which may arise along the way.  From Head to Heart: The Power of a Healthy Outlook  Clinical staff led group instruction and group discussion with PowerPoint presentation and patient guidebook. To enhance the learning environment the use of posters, models and videos may be added. Patients will be able to recognize and describe the impact of emotions and mood on physical health. They will discover the importance of self-care and explore self-care practices which may work for them. Patients will also learn how to utilize the 4 Cs  to cultivate a healthier outlook and better manage stress and challenges. The purpose of this lesson is to demonstrate to patients how a healthy outlook is an essential part of maintaining good health, especially as they continue their cardiac rehab journey.  Healthy Sleep for a Healthy Heart Clinical staff led group instruction and group discussion with PowerPoint presentation and patient guidebook. To enhance the learning environment the use of posters, models and videos may be added. At the conclusion of this workshop, patients will be able to demonstrate knowledge of the importance of sleep to overall health, well-being, and quality of life. They will understand the symptoms of, and treatments for, common sleep disorders. Patients will also be able to identify daytime and nighttime behaviors which impact sleep, and they will be able to apply these tools to help manage sleep-related challenges. The purpose of this lesson is to provide patients with a general overview of sleep and outline the importance of quality sleep. Patients will  learn about a few of the most common sleep disorders. Patients will also be introduced to the concept of sleep hygiene, and discover ways to self-manage certain sleeping problems through simple daily behavior changes. Finally, the workshop will motivate patients by clarifying the links between quality sleep and their goals of heart-healthy living.   Recognizing and Reducing Stress Clinical staff led group instruction and group discussion with PowerPoint presentation and patient guidebook. To enhance the learning environment the use of posters, models and videos may be added. At the conclusion of this workshop, patients will be able to understand the types of stress reactions, differentiate between acute and chronic stress, and recognize the impact that chronic stress has on their health. They will also be able to apply different coping mechanisms, such as reframing negative self-talk. Patients will have the opportunity to practice a variety of stress management techniques, such as deep abdominal breathing, progressive muscle relaxation, and/or guided imagery.  The purpose of this lesson is to educate patients on the role of stress in their lives and to provide healthy techniques for coping with it.  Learning Barriers/Preferences:  Learning Barriers/Preferences - 05/21/24 1118       Learning Barriers/Preferences   Learning Barriers Sight    Learning Preferences Written Material;Audio;Computer/Internet;Group Instruction;Individual Instruction;Pictoral;Skilled Demonstration;Verbal Instruction;Video          Education Topics:  Knowledge Questionnaire Score:  Knowledge Questionnaire Score - 05/21/24 1124       Knowledge Questionnaire Score   Pre Score 21/24          Core Components/Risk Factors/Patient Goals at Admission:  Personal Goals and Risk Factors at Admission - 05/21/24 1118       Core Components/Risk Factors/Patient Goals on Admission   Diabetes Yes    Intervention Provide  education about signs/symptoms and action to take for hypo/hyperglycemia.;Provide education about proper nutrition, including hydration, and aerobic/resistive exercise prescription along with prescribed medications to achieve blood glucose in normal ranges: Fasting glucose 65-99 mg/dL    Expected Outcomes Short Term: Participant verbalizes understanding of the signs/symptoms and immediate care of hyper/hypoglycemia, proper foot care and importance of medication, aerobic/resistive exercise and nutrition plan for blood glucose control.;Long Term: Attainment of HbA1C < 7%.    Hypertension Yes    Intervention Provide education on lifestyle modifcations including regular physical activity/exercise, weight management, moderate sodium restriction and increased consumption of fresh fruit, vegetables, and low fat dairy, alcohol moderation, and smoking cessation.;Monitor prescription use compliance.    Expected Outcomes Short Term: Continued assessment and intervention  until BP is < 140/49mm HG in hypertensive participants. < 130/29mm HG in hypertensive participants with diabetes, heart failure or chronic kidney disease.;Long Term: Maintenance of blood pressure at goal levels.    Lipids Yes    Intervention Provide education and support for participant on nutrition & aerobic/resistive exercise along with prescribed medications to achieve LDL 70mg , HDL >40mg .    Expected Outcomes Long Term: Cholesterol controlled with medications as prescribed, with individualized exercise RX and with personalized nutrition plan. Value goals: LDL < 70mg , HDL > 40 mg.;Short Term: Participant states understanding of desired cholesterol values and is compliant with medications prescribed. Participant is following exercise prescription and nutrition guidelines.    Stress Yes    Intervention Offer individual and/or small group education and counseling on adjustment to heart disease, stress management and health-related lifestyle change.  Teach and support self-help strategies.;Refer participants experiencing significant psychosocial distress to appropriate mental health specialists for further evaluation and treatment. When possible, include family members and significant others in education/counseling sessions.    Expected Outcomes Short Term: Participant demonstrates changes in health-related behavior, relaxation and other stress management skills, ability to obtain effective social support, and compliance with psychotropic medications if prescribed.;Long Term: Emotional wellbeing is indicated by absence of clinically significant psychosocial distress or social isolation.          Core Components/Risk Factors/Patient Goals Review:   Goals and Risk Factor Review     Row Name 06/08/24 1645 06/30/24 1307           Core Components/Risk Factors/Patient Goals Review   Personal Goals Review Weight Management/Obesity;Lipids;Stress;Hypertension;Diabetes Weight Management/Obesity;Lipids;Stress;Hypertension;Diabetes      Review Macario started cardiac rehab on 06/08/24. Macario did well with exercise for her fitness level. Vital signs and CBg's were stable. Macario started cardiac rehab on 06/08/24. Macario is off to a good start with exercise for her fitness level. Vital signs and CBg's have been stable. Macario has only attended a few sessions.      Expected Outcomes Macario will continue to participate in cardiac rehab for exercise, nutrition and lifestyle modifications. Macario will continue to participate in cardiac rehab for exercise, nutrition and lifestyle modifications.         Core Components/Risk Factors/Patient Goals at Discharge (Final Review):   Goals and Risk Factor Review - 06/30/24 1307       Core Components/Risk Factors/Patient Goals Review   Personal Goals Review Weight Management/Obesity;Lipids;Stress;Hypertension;Diabetes    Review Macario started cardiac rehab on 06/08/24. Macario is off to a good start with exercise for her fitness  level. Vital signs and CBg's have been stable. Macario has only attended a few sessions.    Expected Outcomes Macario will continue to participate in cardiac rehab for exercise, nutrition and lifestyle modifications.          ITP Comments:  ITP Comments     Row Name 05/21/24 1106 06/01/24 1634 06/08/24 1640 06/30/24 1303     ITP Comments Wilbert Bihari, MD: Medical Director.  Introduction to the [facility] program/Intensive cardiac rehab. Initial orientation packet reviewed with the patient. 30 Day ITP review. Exercise is currently on hold due to recent hospitalization. Toi has not started exercise yet. 30 Day ITP review. Bunnie started exercise at cardiac rehab on 06/08/24. Chalsey did well with exercise for her fitness level. 30 Day ITP review. Labria has good participation with exercise at cardiac rehab when in attendance       Comments: See ITP Comments     [1]  Current  Outpatient Medications:    ACCU-CHEK GUIDE TEST test strip, 1 strip by Other route as needed (Pt has freestyle [place])., Disp: , Rfl:    acetaminophen  (TYLENOL ) 500 MG tablet, Take 500-1,000 mg by mouth every 6 (six) hours as needed (for pain.)., Disp: , Rfl:    apixaban  (ELIQUIS ) 5 MG TABS tablet, Take 1 tablet (5 mg total) by mouth 2 (two) times daily., Disp: 60 tablet, Rfl: 11   atorvastatin  (LIPITOR) 10 MG tablet, Take 10 mg by mouth in the morning. (Patient not taking: Reported on 05/21/2024), Disp: , Rfl:    atovaquone (MEPRON) 750 MG/5ML suspension, Take 1,500 mg by mouth daily with breakfast., Disp: , Rfl:    Calcium  Carb-Cholecalciferol 600-10 MG-MCG TABS, Take 1 tablet by mouth., Disp: , Rfl:    Continuous Glucose Sensor (FREESTYLE LIBRE 2 PLUS SENSOR) MISC, 1 kit by Other route daily., Disp: , Rfl:    diltiazem (CARDIZEM CD) 120 MG 24 hr capsule, Take 240 mg by mouth., Disp: , Rfl:    Fingerstix Lancets MISC, 1 each by Other route daily., Disp: , Rfl:    furosemide  (LASIX ) 20 MG tablet, Take 40  mg by mouth daily., Disp: , Rfl:    Insulin  Aspart FlexPen (NOVOLOG ) 100 UNIT/ML, Inject 7 Units into the skin daily., Disp: , Rfl:    magnesium  oxide (MAG-OX) 400 MG tablet, Take 400 mg by mouth daily., Disp: , Rfl:    metFORMIN  (GLUCOPHAGE -XR) 500 MG 24 hr tablet, Take 1,000 mg by mouth 2 (two) times daily with a meal., Disp: , Rfl:    mycophenolate (CELLCEPT) 250 MG capsule, Take 1,500 mg by mouth., Disp: , Rfl:    pantoprazole (PROTONIX) 40 MG tablet, Take 40 mg by mouth., Disp: , Rfl:    predniSONE (DELTASONE) 5 MG tablet, Take 15 mg by mouth daily., Disp: , Rfl:    rosuvastatin (CRESTOR) 10 MG tablet, Take 10 mg by mouth at bedtime., Disp: , Rfl:    senna-docusate (SENOKOT-S) 8.6-50 MG tablet, Take 1 tablet by mouth 2 (two) times daily., Disp: , Rfl:    tacrolimus  (PROGRAF ) 5 MG capsule, Take 2 capsules (10 mg total) by mouth every 12 (twelve) hours. (Patient not taking: Reported on 05/21/2024), Disp: 120 capsule, Rfl: 11   tacrolimus  ER (ENVARSUS  XR) 4 MG TB24, Take 20 mg by mouth daily., Disp: , Rfl:    valGANciclovir (VALCYTE) 450 MG tablet, Take 450 mg by mouth daily., Disp: , Rfl:  [2]  Social History Tobacco Use  Smoking Status Former   Current packs/day: 0.00   Average packs/day: 0.8 packs/day for 5.0 years (3.8 ttl pk-yrs)   Types: Cigarettes   Start date: 01/11/1992   Quit date: 01/10/1997   Years since quitting: 27.4  Smokeless Tobacco Never

## 2024-07-01 ENCOUNTER — Encounter (HOSPITAL_COMMUNITY)
Admission: RE | Admit: 2024-07-01 | Discharge: 2024-07-01 | Disposition: A | Discharge status: Home / Self Care | Source: Ambulatory Visit | Attending: Cardiology | Admitting: Cardiology

## 2024-07-01 DIAGNOSIS — Z941 Heart transplant status: Secondary | ICD-10-CM

## 2024-07-02 ENCOUNTER — Ambulatory Visit (INDEPENDENT_AMBULATORY_CARE_PROVIDER_SITE_OTHER)

## 2024-07-02 ENCOUNTER — Ambulatory Visit (HOSPITAL_COMMUNITY)
Admission: EM | Admit: 2024-07-02 | Discharge: 2024-07-02 | Disposition: A | Discharge status: Home / Self Care | Attending: Emergency Medicine | Admitting: Emergency Medicine

## 2024-07-02 ENCOUNTER — Encounter (HOSPITAL_COMMUNITY): Payer: Self-pay | Admitting: Emergency Medicine

## 2024-07-02 ENCOUNTER — Ambulatory Visit (HOSPITAL_COMMUNITY): Payer: Self-pay | Admitting: Emergency Medicine

## 2024-07-02 DIAGNOSIS — R058 Other specified cough: Secondary | ICD-10-CM

## 2024-07-02 DIAGNOSIS — J069 Acute upper respiratory infection, unspecified: Secondary | ICD-10-CM

## 2024-07-02 DIAGNOSIS — Z941 Heart transplant status: Secondary | ICD-10-CM | POA: Diagnosis not present

## 2024-07-02 MED ORDER — AMOXICILLIN-POT CLAVULANATE 875-125 MG PO TABS: 1.0000 | ORAL_TABLET | Freq: Two times a day (BID) | ORAL | 0 refills | Status: AC

## 2024-07-02 MED ORDER — AZITHROMYCIN 250 MG PO TABS: ORAL_TABLET | ORAL | 0 refills | Status: AC

## 2024-07-02 NOTE — ED Triage Notes (Signed)
 Symptoms for 4 days.  Complains of cough, sore throat, stuffy nose.  denies fever.  Has thick, yellow phlegm.  Uri 2 weeks ago.    Has used nasal spray, throat spray, tylenol , tussin dm.     Patient had a heart transplant in August of 2025

## 2024-07-02 NOTE — Discharge Instructions (Signed)
 Your chest x-ray is concerning for infection in your lungs.  Per my personal interpretation of your chest x-ray, I believe this is a viral infection at this time.  Due to your history of heart transplant, I do think it is important to provide you with antibiotics to prevent the viral infection from becoming a bacterial infection.  Please read below to learn more about the medications, dosages and frequencies that I recommend to help alleviate your symptoms and to get you feeling better soon:   Augmentin  (amoxicillin  - clavulanic acid):  Please take one (1) dose twice daily for 5 days.This antibiotic can cause upset stomach, this will resolve once antibiotics are complete.  You are welcome to take a probiotic, eat yogurt, take Imodium while taking this medication.  Please avoid other systemic medications such as Maalox, Pepto-Bismol or milk of magnesia as they can interfere with the body's ability to absorb the antibiotics.    Z-Pak (azithromycin ):  Please take two (2) tablets on day one and one tablet daily thereafter until the prescription is complete.This antibiotic can cause upset stomach, this will resolve once antibiotics are complete.  You are welcome to take a probiotic, eat yogurt, take Imodium while taking this medication.  Please avoid other systemic medications such as Maalox, Pepto-Bismol or milk of magnesia as they can interfere with the body's ability to absorb the antibiotics.       Both of these antibiotics are safe to take with your other medications.  You are welcome to continue with the over-the-counter medications that you are taking at this time, if they are providing you with relief.  If symptoms have not meaningfully improved in the next 5 to 7 days, please return for repeat evaluation or follow-up with your regular provider.  If symptoms have worsened in the next 3 to 5 days, please go to the emergency room for further evaluation.    Thank you for visiting urgent care today.   We appreciate the opportunity to participate in your care.

## 2024-07-02 NOTE — ED Provider Notes (Signed)
 "    [facility]    CSN: [id number] Arrival date & time: 07/02/24  9173    HISTORY   Chief Complaint  Patient presents with   Cough   HPI Lynn Estrada is a pleasant, 64 y.o. female who presents to urgent care today. Patient states that for the past 4 days she has had a cough, sore throat, nasal congestion, clear rhinorrhea.  Patient states the cough is productive of thick, yellow phlegm.  Patient denies fever, body aches, chills, nausea, vomiting, diarrhea.  Patient states she has been using a prescription nasal spray, over-the-counter Robitussin DM without relief.  Patient states she had a heart transplant 4 months ago and is concerned about the possibility of developing pneumonia.  Symptoms for 4 days.  Complains of cough, sore throat, stuffy nose.  denies fever.  Has thick, yellow phlegm.  Uri 2 weeks ago.     Has used nasal spray, throat spray, tylenol , tussin dm.       Patient had a heart transplant in August of 2025     Cough  Past Medical History:  Diagnosis Date   Anemia    Arthritis    CHF (congestive heart failure) (HCC) 12/06/2022   EF is 20% to 25 %   COPD (chronic obstructive pulmonary disease) (HCC)    Diabetes mellitus without complication (HCC)    Eczema    GERD (gastroesophageal reflux disease)    History of adenomatous polyp of colon    08-03-2016  tubular adenoma x2 and hyperplastic   History of chronic gastritis 08/03/2016   History of ectopic pregnancy 1988   s/p  left salpingectomy   History of endometriosis    History of kidney stones    History of partial nephrectomy    right pelvis for very large stone   History of pneumonia 07/02/2016   CAP   History of sepsis    07-01-2016  sepsis w/ pyelonephritis, CAP /   12-31-2016  urosepsis w/ kidney stone obstruction   History of uterine leiomyoma    Hx of adenomatous colonic polyps 08/03/2016   2 adenomas, 1 hpp and 1 lost polyp 2018 all < 1 cm    Hyperlipidemia    Hypertension     Left ureteral stone    Myocardial infarction [facility])    Nephrolithiasis    left obstructive stone and right non-obstructive stone  per CT 12-31-2016   Urgency of urination    Patient Active Problem List   Diagnosis Date Noted   History of CVA (cerebrovascular accident) 11/20/2023   Acute on chronic systolic (congestive) heart failure (HCC) 11/16/2023   SOB (shortness of breath) 10/08/2023   History of pulmonary embolism 10/08/2023   DM2 (diabetes mellitus, type 2) (HCC) 10/08/2023   Anemia of chronic disease 10/08/2023   Aortic atherosclerosis 10/01/2023   Atherosclerosis of coronary artery 10/01/2023   Pulmonary emboli (HCC) 09/30/2023   HCAP (healthcare-associated pneumonia) 09/12/2023   Acute on chronic combined systolic and diastolic CHF (congestive heart failure) (HCC) 08/23/2023   Frequency of urination and polyuria 08/23/2023   Cerebrovascular accident (CVA) (HCC) 05/26/2023   Pyuria 05/25/2023   Left-sided weakness 05/24/2023   Epistaxis 04/22/2023   Benign neoplasm of cecum 03/26/2023   Benign neoplasm of descending colon 03/26/2023   Benign neoplasm of sigmoid colon 03/26/2023   Adhesive capsulitis of right shoulder 08/24/2022   Mass of right hand 02/22/2022   LBBB (left bundle branch block) 02/01/2022   Type 2 diabetes mellitus with  hyperglycemia, without long-term current use of insulin  (HCC) 01/22/2022   HLD (hyperlipidemia) 01/21/2022   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 10/26/2021   Chronic combined systolic and diastolic heart failure (HCC) 07/27/2021   Acute combined systolic (congestive) and diastolic (congestive) heart failure (HCC)    Hypocalcemia 07/12/2021   Elevated troponin 07/12/2021   Lipoma of hand 07/20/2020   Osteoarthritis of carpometacarpal (CMC) joint of thumb 07/20/2020   Pain in right hand 07/20/2020   Radial styloid tenosynovitis 07/20/2020   Other osteoarthritis of spine 10/23/2019   Suspected COVID-19 virus  infection 06/11/2019   Acute respiratory failure with hypoxia (HCC) 06/11/2019   Multifocal pneumonia 06/11/2019   Carpal tunnel syndrome, bilateral 04/24/2019   Ureteral stone with hydronephrosis 12/31/2016   Hx of adenomatous colonic polyps 08/03/2016   Normocytic anemia 07/10/2016   CAP (community acquired pneumonia) 07/06/2016   AKI (acute kidney injury) 07/02/2016   Essential hypertension 07/26/2015   Eczema 07/26/2015   Overweight (BMI 25.0-29.9) 07/26/2015   Past Surgical History:  Procedure Laterality Date   ABDOMINAL HYSTERECTOMY  01/20/2007   w/ Lysis Adhesions/  Left salpingoophorectomy/  Right Salpingectomy   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   Procedure: BIV ICD INSERTION CRT-D;  Surgeon: Nancey Eulas BRAVO, MD;  Location[facility] INVASIVE CV LAB;  Service: Cardiovascular;  Laterality: N/A;   CHOLECYSTECTOMY  1994   and Right Partial Nephrectomy (pelvis for very large stone)   COLONOSCOPY WITH PROPOFOL  N/A 03/26/2023   Procedure: COLONOSCOPY WITH PROPOFOL ;  Surgeon: Avram Lupita BRAVO, MD;  Location: [facility];  Service: Gastroenterology;  Laterality: N/A;   CYSTOSCOPY W/ URETERAL STENT PLACEMENT Left 12/31/2016   Procedure: CYSTOSCOPY WITH RETROGRADE PYELOGRAM/ LEFT URETERAL STENT PLACEMENT;  Surgeon: Alvaro Hummer, MD;  Location: [facility];  Service: Urology;  Laterality: Left;   CYSTOSCOPY WITH RETROGRADE PYELOGRAM, URETEROSCOPY AND STENT PLACEMENT Left 01/18/2017   Procedure: 1ST STAGE CYSTOSCOPY WITH RETROGRADE PYELOGRAM, URETEROSCOPY AND STENT REPLACEMENT;  Surgeon: Alvaro Hummer, MD;  Location: [facility];  Service: Urology;  Laterality: Left;   CYSTOSCOPY WITH RETROGRADE PYELOGRAM, URETEROSCOPY AND STENT PLACEMENT Left 02/01/2017   Procedure: 2ND STAGE CYSTOSCOPY WITH RETROGRADE PYELOGRAM, URETEROSCOPY AND STENT REPLACEMENT;  Surgeon: Alvaro Hummer, MD;  Location: [facility];  Service: Urology;  Laterality: Left;   ECTOPIC PREGNANCY  SURGERY  1988   Left Salpingectomy   HOLMIUM LASER APPLICATION Left 01/18/2017   Procedure: HOLMIUM LASER APPLICATION;  Surgeon: Alvaro Hummer, MD;  Location: [facility];  Service: Urology;  Laterality: Left;   HOLMIUM LASER APPLICATION Left 02/01/2017   Procedure: HOLMIUM LASER APPLICATION;  Surgeon: Alvaro Hummer, MD;  Location: [facility];  Service: Urology;  Laterality: Left;   LUMBAR DISC SURGERY  01/1999   right L5 -- S1   POLYPECTOMY  03/26/2023   Procedure: POLYPECTOMY;  Surgeon: Avram Lupita BRAVO, MD;  Location: [facility];  Service: Gastroenterology;;   RE-EXPLORATION LUMBAR/  LAMINECTOMY AND MICRODISKECTOMY  10/14/2001   right L5 -- S1   RIGHT HEART CATH N/A 05/23/2022   Procedure: RIGHT HEART CATH;  Surgeon: Cherrie Toribio SAUNDERS, MD;  Location: [facility];  Service: Cardiovascular;  Laterality: N/A;   RIGHT HEART CATH N/A 10/11/2023   Procedure: RIGHT HEART CATH;  Surgeon: Gardenia Led, DO;  Location: [facility] LAB;  Service: Cardiovascular;  Laterality: N/A;   RIGHT HEART CATH N/A 11/18/2023   Procedure: RIGHT HEART CATH;  Surgeon: Cherrie Toribio SAUNDERS, MD;  Location: [facility]  INVASIVE CV LAB;  Service: Cardiovascular;  Laterality: N/A;   RIGHT HEART CATH N/A 01/29/2024   Procedure: RIGHT HEART CATH;  Surgeon: Cherrie Toribio SAUNDERS, MD;  Location: [facility] LAB;  Service: Cardiovascular;  Laterality: N/A;   RIGHT/LEFT HEART CATH AND CORONARY ANGIOGRAPHY N/A 07/18/2021   Procedure: RIGHT/LEFT HEART CATH AND CORONARY ANGIOGRAPHY;  Surgeon: Cherrie Toribio SAUNDERS, MD;  Location: [facility];  Service: Cardiovascular;  Laterality: N/A;   TUBAL LIGATION Bilateral 10/17/1999   PPTL   UMBILICAL HERNIA REPAIR  age 89   OB History   No obstetric history on file.    Home Medications    Prior to Admission medications  Medication Sig Start Date End Date Taking? Authorizing Provider  ACCU-CHEK GUIDE TEST test strip 1 strip by Other route  as needed (Pt has freestyle [place]). 04/17/24 04/17/25  [provider]  acetaminophen  (TYLENOL ) 500 MG tablet Take 500-1,000 mg by mouth every 6 (six) hours as needed (for pain.).    [provider]  apixaban  (ELIQUIS ) 5 MG TABS tablet Take 1 tablet (5 mg total) by mouth 2 (two) times daily. 10/24/23   Marcine Catalan M, PA-C  atorvastatin  (LIPITOR) 10 MG tablet Take 10 mg by mouth in the morning. Patient not taking: Reported on 05/21/2024 05/15/21   [provider]  atovaquone (MEPRON) 750 MG/5ML suspension Take 1,500 mg by mouth daily with breakfast. 02/20/24 05/19/25  [provider]  Calcium  Carb-Cholecalciferol 600-10 MG-MCG TABS Take 1 tablet by mouth. 05/19/24 05/19/25  [provider]  Continuous Glucose Sensor (FREESTYLE LIBRE 2 PLUS SENSOR) MISC 1 kit by Other route daily. 04/17/24   [provider]  diltiazem (CARDIZEM CD) 120 MG 24 hr capsule Take 240 mg by mouth. 05/01/24 05/19/25  [provider]  Fingerstix Lancets MISC 1 each by Other route daily. 05/01/24   [provider]  furosemide  (LASIX ) 20 MG tablet Take 40 mg by mouth daily. 04/30/24 05/19/25  [provider]  Insulin  Aspart FlexPen (NOVOLOG ) 100 UNIT/ML Inject 7 Units into the skin daily. 05/11/24 05/11/25  [provider]  magnesium  oxide (MAG-OX) 400 MG tablet Take 400 mg by mouth daily. 02/17/24 05/19/25  [provider]  metFORMIN  (GLUCOPHAGE -XR) 500 MG 24 hr tablet Take 1,000 mg by mouth 2 (two) times daily with a meal. 05/19/24 08/12/25  [provider]  mycophenolate (CELLCEPT) 250 MG capsule Take 1,500 mg by mouth. 04/21/24 05/19/25  [provider]  pantoprazole (PROTONIX) 40 MG tablet Take 40 mg by mouth. 02/18/24 05/19/25  [provider]  predniSONE (DELTASONE) 5 MG tablet Take 15 mg by mouth daily.    [provider]  rosuvastatin (CRESTOR) 10 MG tablet Take 10 mg by mouth at  bedtime.    [provider]  senna-docusate (SENOKOT-S) 8.6-50 MG tablet Take 1 tablet by mouth 2 (two) times daily. 05/19/24   [provider]  tacrolimus  (PROGRAF ) 5 MG capsule Take 2 capsules (10 mg total) by mouth every 12 (twelve) hours. Patient not taking: Reported on 05/21/2024 03/17/24     tacrolimus  ER (ENVARSUS  XR) 4 MG TB24 Take 20 mg by mouth daily. 04/17/24   [provider]  valGANciclovir (VALCYTE) 450 MG tablet Take 450 mg by mouth daily.    [provider]    Family History Family History  Problem Relation Age of Onset   Sarcoidosis Mother    Prostate cancer Father    Hypertension Sister    Eczema Sister  Lupus Sister    Breast cancer Maternal Aunt    Gout Maternal Grandmother    Diabetes Maternal Grandmother        leg amputations   Stomach cancer Neg Hx    Colon cancer Neg Hx    Esophageal cancer Neg Hx    Rectal cancer Neg Hx    Social History Social History[1] Allergies   Latex, Bactrim [sulfamethoxazole -trimethoprim], Carvedilol , and Metoprolol   Review of Systems Review of Systems  Respiratory:  Positive for cough.    Pertinent findings revealed after performing a 14 point review of systems has been noted in the history of present illness.  Physical Exam Vital Signs BP 109/75 (BP Location: Left Arm)   Pulse (!) 105   Temp 98.5 F (36.9 C) (Oral)   Resp (!) 24   SpO2 99%   No data found.  Physical Exam Vitals and nursing note reviewed.  Constitutional:      General: She is awake. She is not in acute distress.    Appearance: Normal appearance. She is well-developed and well-groomed. She is not ill-appearing.  HENT:     Head: Normocephalic and atraumatic.     Salivary Glands: Right salivary gland is not diffusely enlarged or tender. Left salivary gland is not diffusely enlarged or tender.     Right Ear: Hearing, tympanic membrane, ear canal and external ear normal.     Left Ear: Hearing, tympanic membrane,  ear canal and external ear normal.     Nose: Nose normal.     Right Turbinates: Not enlarged, swollen or pale.     Left Turbinates: Not enlarged, swollen or pale.     Right Sinus: No maxillary sinus tenderness or frontal sinus tenderness.     Left Sinus: No maxillary sinus tenderness or frontal sinus tenderness.     Mouth/Throat:     Lips: Pink. No lesions.     Mouth: Mucous membranes are moist. No oral lesions.     Tongue: No lesions. Tongue does not deviate from midline.     Palate: No mass and lesions.     Pharynx: Oropharynx is clear. Uvula midline. No pharyngeal swelling, oropharyngeal exudate, posterior oropharyngeal erythema, uvula swelling or postnasal drip.     Tonsils: No tonsillar exudate. 0 on the right. 0 on the left.  Eyes:     General: Lids are normal.        Right eye: No discharge.        Left eye: No discharge.     Conjunctiva/sclera: Conjunctivae normal.     Right eye: Right conjunctiva is not injected.     Left eye: Left conjunctiva is not injected.  Neck:     Trachea: Trachea and phonation normal.  Cardiovascular:     Rate and Rhythm: Normal rate and regular rhythm.  Pulmonary:     Effort: Pulmonary effort is normal.     Breath sounds: Normal breath sounds and air entry. No wheezing, rhonchi or rales.  Chest:     Chest wall: No tenderness.  Musculoskeletal:        General: Normal range of motion.     Cervical back: Full passive range of motion without pain, normal range of motion and neck supple. Normal range of motion.  Lymphadenopathy:     Cervical: No cervical adenopathy.  Skin:    General: Skin is warm and dry.     Findings: No erythema or rash.  Neurological:     General: No focal deficit present.  Mental Status: She is alert and oriented to person, place, and time. Mental status is at baseline.  Psychiatric:        Attention and Perception: Attention and perception normal.        Mood and Affect: Mood and affect normal.        Speech: Speech  normal.        Behavior: Behavior normal. Behavior is cooperative.        Thought Content: Thought content normal.     Visual Acuity Right Eye Distance:   Left Eye Distance:   Bilateral Distance:    Right Eye Near:   Left Eye Near:    Bilateral Near:     UC Couse / Diagnostics / Procedures:     Radiology No results found.  Procedures Procedures (including critical care time) EKG  Pending results:  Labs Reviewed - No data to display  Medications Ordered in UC: Medications - No data to display  UC Diagnoses / Final Clinical Impressions(s)   I have reviewed the triage vital signs and the nursing notes.  Pertinent labs & imaging results that were available during my care of the patient were reviewed by me and considered in my medical decision making (see chart for details).    Final diagnoses:  Productive cough  Status post orthotopic heart transplant (HCC)  Viral URI with cough   Independent read of patient's chest x-ray is concerning for lower respiratory tract infection, most likely viral.  Given history of heart transplant, recommend pneumonia prophylaxis with Augmentin  and azithromycin .  Patient advised to continue OTCs if they are helpful.  Conservative care recommended.  Return precautions advised.  Please see discharge instructions below for details of plan of care as provided to patient. ED Prescriptions     Medication Sig Dispense Auth. Provider   azithromycin  (ZITHROMAX ) 250 MG tablet Take 2 tablets (500 mg total) by mouth daily for 1 day, THEN 1 tablet (250 mg total) daily for 4 days. 6 tablet Joesph Shaver Scales, PA-C   amoxicillin -clavulanate (AUGMENTIN ) 875-125 MG tablet Take 1 tablet by mouth 2 (two) times daily for 5 days. 10 tablet Joesph Shaver Scales, PA-C      PDMP not reviewed this encounter.    Discharge Instructions      Your chest x-ray is concerning for infection in your lungs.  Per my personal interpretation of your chest x-ray, I  believe this is a viral infection at this time.  Due to your history of heart transplant, I do think it is important to provide you with antibiotics to prevent the viral infection from becoming a bacterial infection.  Please read below to learn more about the medications, dosages and frequencies that I recommend to help alleviate your symptoms and to get you feeling better soon:   Augmentin  (amoxicillin  - clavulanic acid):  Please take one (1) dose twice daily for 5 days.This antibiotic can cause upset stomach, this will resolve once antibiotics are complete.  You are welcome to take a probiotic, eat yogurt, take Imodium while taking this medication.  Please avoid other systemic medications such as Maalox, Pepto-Bismol or milk of magnesia as they can interfere with the body's ability to absorb the antibiotics.    Z-Pak (azithromycin ):  Please take two (2) tablets on day one and one tablet daily thereafter until the prescription is complete.This antibiotic can cause upset stomach, this will resolve once antibiotics are complete.  You are welcome to take a probiotic, eat yogurt, take Imodium while taking  this medication.  Please avoid other systemic medications such as Maalox, Pepto-Bismol or milk of magnesia as they can interfere with the body's ability to absorb the antibiotics.       Both of these antibiotics are safe to take with your other medications.  You are welcome to continue with the over-the-counter medications that you are taking at this time, if they are providing you with relief.  If symptoms have not meaningfully improved in the next 5 to 7 days, please return for repeat evaluation or follow-up with your regular provider.  If symptoms have worsened in the next 3 to 5 days, please go to the emergency room for further evaluation.    Thank you for visiting urgent care today.  We appreciate the opportunity to participate in your care.       Disposition Upon Discharge:  Condition:  stable for discharge home  Patient presented with an acute illness with associated systemic symptoms and significant discomfort requiring urgent management. In my opinion, this is a condition that a prudent lay person (someone who possesses an average knowledge of health and medicine) may potentially expect to result in complications if not addressed urgently such as respiratory distress, impairment of bodily function or dysfunction of bodily organs.   Routine symptom specific, illness specific and/or disease specific instructions were discussed with the patient and/or caregiver at length.   As such, the patient has been evaluated and assessed, work-up was performed and treatment was provided in alignment with urgent care protocols and evidence based medicine.  Patient/parent/caregiver has been advised that the patient may require follow up for further testing and treatment if the symptoms continue in spite of treatment, as clinically indicated and appropriate.  Patient/parent/caregiver has been advised to return to the [facility] or PCP if no better; to PCP or the Emergency Department if new signs and symptoms develop, or if the current signs or symptoms continue to change or worsen for further workup, evaluation and treatment as clinically indicated and appropriate  The patient will follow up with their current PCP if and as advised. If the patient does not currently have a PCP we will assist them in obtaining one.   The patient may need specialty follow up if the symptoms continue, in spite of conservative treatment and management, for further workup, evaluation, consultation and treatment as clinically indicated and appropriate.  Patient/parent/caregiver verbalized understanding and agreement of plan as discussed.  All questions were addressed during visit.  Please see discharge instructions below for further details of plan.  This office note has been dictated using Teaching laboratory technician.   Unfortunately, this method of dictation can sometimes lead to typographical or grammatical errors.  I apologize for your inconvenience in advance if this occurs.  Please do not hesitate to reach out to me if clarification is needed.       [1]  Social History Tobacco Use   Smoking status: Former    Current packs/day: 0.00    Average packs/day: 0.8 packs/day for 5.0 years (3.8 ttl pk-yrs)    Types: Cigarettes    Start date: 01/11/1992    Quit date: 01/10/1997    Years since quitting: 27.4   Smokeless tobacco: Never  Vaping Use   Vaping status: Never Used  Substance Use Topics   Alcohol use: Not Currently    Comment: social use; once a month   Drug use: No     Joesph Shaver [place], PA-C 07/02/24 0940  "

## 2024-07-02 NOTE — Progress Notes (Signed)
 No change to current plan of care based on x-ray findings.

## 2024-07-03 ENCOUNTER — Encounter (HOSPITAL_COMMUNITY)

## 2024-07-03 ENCOUNTER — Telehealth (HOSPITAL_COMMUNITY): Payer: Self-pay | Admitting: *Deleted

## 2024-07-03 NOTE — Telephone Encounter (Signed)
 Retrieved message from Cardiac rehab departmental voicemail.  Lynn Estrada will be out today- not feeling well.  Will alert CR staff.Lynn Estrada PEAK, BSN Cardiac and Emergency Planning/management Officer

## 2024-07-06 ENCOUNTER — Encounter (HOSPITAL_COMMUNITY)
Admission: RE | Admit: 2024-07-06 | Discharge: 2024-07-06 | Disposition: A | Discharge status: Home / Self Care | Source: Ambulatory Visit | Attending: Cardiology | Admitting: Cardiology

## 2024-07-06 DIAGNOSIS — Z4821 Encounter for aftercare following heart transplant: Secondary | ICD-10-CM | POA: Insufficient documentation

## 2024-07-06 DIAGNOSIS — D849 Immunodeficiency, unspecified: Secondary | ICD-10-CM | POA: Insufficient documentation

## 2024-07-06 DIAGNOSIS — I82511 Chronic embolism and thrombosis of right femoral vein: Secondary | ICD-10-CM | POA: Diagnosis not present

## 2024-07-06 DIAGNOSIS — Z48812 Encounter for surgical aftercare following surgery on the circulatory system: Secondary | ICD-10-CM | POA: Diagnosis not present

## 2024-07-06 DIAGNOSIS — Z941 Heart transplant status: Secondary | ICD-10-CM | POA: Insufficient documentation

## 2024-07-08 ENCOUNTER — Encounter (HOSPITAL_COMMUNITY)

## 2024-07-08 ENCOUNTER — Telehealth (HOSPITAL_COMMUNITY): Payer: Self-pay | Admitting: *Deleted

## 2024-07-08 NOTE — Telephone Encounter (Signed)
 Lynn Estrada left a message on the department voicemail yesterday. She has a doctor's appointment today, and will be unable to attend cardiac rehab.

## 2024-07-09 ENCOUNTER — Other Ambulatory Visit (HOSPITAL_COMMUNITY)
Admission: EM | Admit: 2024-07-09 | Discharge: 2024-07-09 | Disposition: A | Discharge status: Home / Self Care | Source: Ambulatory Visit | Attending: Physician Assistant | Admitting: Physician Assistant

## 2024-07-09 ENCOUNTER — Encounter (HOSPITAL_COMMUNITY): Payer: Self-pay | Admitting: Cardiology

## 2024-07-09 ENCOUNTER — Other Ambulatory Visit (HOSPITAL_COMMUNITY): Payer: Self-pay | Admitting: Cardiology

## 2024-07-09 DIAGNOSIS — Z941 Heart transplant status: Secondary | ICD-10-CM | POA: Insufficient documentation

## 2024-07-09 DIAGNOSIS — D849 Immunodeficiency, unspecified: Secondary | ICD-10-CM | POA: Diagnosis not present

## 2024-07-09 DIAGNOSIS — Z48298 Encounter for aftercare following other organ transplant: Secondary | ICD-10-CM | POA: Insufficient documentation

## 2024-07-09 LAB — BASIC METABOLIC PANEL WITH GFR
Anion gap: 11 (ref 5–15)
BUN: 37 mg/dL — ABNORMAL HIGH (ref 8–23)
CO2: 28 mmol/L (ref 22–32)
Calcium: 9.5 mg/dL (ref 8.9–10.3)
Chloride: 103 mmol/L (ref 98–111)
Creatinine, Ser: 1.4 mg/dL — ABNORMAL HIGH (ref 0.44–1.00)
GFR, Estimated: 42 mL/min — ABNORMAL LOW
Glucose, Bld: 121 mg/dL — ABNORMAL HIGH (ref 70–99)
Potassium: 3.4 mmol/L — ABNORMAL LOW (ref 3.5–5.1)
Sodium: 141 mmol/L (ref 135–145)

## 2024-07-10 ENCOUNTER — Ambulatory Visit (HOSPITAL_COMMUNITY)
Admission: RE | Admit: 2024-07-10 | Discharge: 2024-07-10 | Disposition: A | Source: Ambulatory Visit | Attending: Surgery | Admitting: Surgery

## 2024-07-10 ENCOUNTER — Encounter (HOSPITAL_COMMUNITY)
Admission: RE | Admit: 2024-07-10 | Discharge: 2024-07-10 | Disposition: A | Source: Ambulatory Visit | Attending: Cardiology

## 2024-07-10 ENCOUNTER — Ambulatory Visit: Admitting: Pharmacist

## 2024-07-10 DIAGNOSIS — Z941 Heart transplant status: Secondary | ICD-10-CM

## 2024-07-10 DIAGNOSIS — D849 Immunodeficiency, unspecified: Secondary | ICD-10-CM | POA: Insufficient documentation

## 2024-07-10 DIAGNOSIS — Z48812 Encounter for surgical aftercare following surgery on the circulatory system: Secondary | ICD-10-CM | POA: Diagnosis not present

## 2024-07-11 LAB — TACROLIMUS LEVEL: Tacrolimus (FK506) - LabCorp: 9.2 ng/mL (ref 5.0–20.0)

## 2024-07-13 ENCOUNTER — Encounter (HOSPITAL_COMMUNITY)
Admission: RE | Admit: 2024-07-13 | Discharge: 2024-07-13 | Disposition: A | Source: Ambulatory Visit | Attending: Cardiology

## 2024-07-13 DIAGNOSIS — Z48812 Encounter for surgical aftercare following surgery on the circulatory system: Secondary | ICD-10-CM | POA: Diagnosis not present

## 2024-07-13 DIAGNOSIS — Z941 Heart transplant status: Secondary | ICD-10-CM

## 2024-07-15 ENCOUNTER — Encounter (HOSPITAL_COMMUNITY)
Admission: RE | Admit: 2024-07-15 | Discharge: 2024-07-15 | Disposition: A | Discharge status: Home / Self Care | Source: Ambulatory Visit | Attending: Cardiology | Admitting: Cardiology

## 2024-07-15 DIAGNOSIS — Z941 Heart transplant status: Secondary | ICD-10-CM

## 2024-07-15 DIAGNOSIS — Z48812 Encounter for surgical aftercare following surgery on the circulatory system: Secondary | ICD-10-CM | POA: Diagnosis not present

## 2024-07-15 NOTE — Progress Notes (Signed)
 Reviewed home exercise guidelines with Lynn Estrada including endpoints, temperature precautions, target heart rate and rate of perceived exertion. She is currently walking outside or at local gym on the indoor track as her mode of home exercise. Lynn Estrada has 2 to 5 lb weights at home that she can use for resistance exercise. Lynn Estrada voices understanding of instructions given.  Arnoldo CHRISTELLA Gal, MS, ACSM CEP

## 2024-07-17 ENCOUNTER — Encounter (HOSPITAL_COMMUNITY)

## 2024-07-17 ENCOUNTER — Telehealth (HOSPITAL_COMMUNITY): Payer: Self-pay

## 2024-07-17 NOTE — Telephone Encounter (Signed)
 Patient c/o for 10:15 CR class, no reason given.

## 2024-07-20 ENCOUNTER — Encounter (HOSPITAL_COMMUNITY): Admission: RE | Admit: 2024-07-20

## 2024-07-22 ENCOUNTER — Ambulatory Visit: Payer: Self-pay | Admitting: Cardiology

## 2024-07-22 ENCOUNTER — Encounter (HOSPITAL_COMMUNITY)
Admission: RE | Admit: 2024-07-22 | Discharge: 2024-07-22 | Disposition: A | Source: Ambulatory Visit | Attending: Cardiology

## 2024-07-22 DIAGNOSIS — Z941 Heart transplant status: Secondary | ICD-10-CM

## 2024-07-22 DIAGNOSIS — Z48812 Encounter for surgical aftercare following surgery on the circulatory system: Secondary | ICD-10-CM | POA: Diagnosis not present

## 2024-07-22 LAB — GLUCOSE, CAPILLARY
Glucose-Capillary: 96 mg/dL (ref 70–99)
Glucose-Capillary: 112 mg/dL — ABNORMAL HIGH (ref 70–99)

## 2024-07-24 ENCOUNTER — Telehealth (HOSPITAL_COMMUNITY): Payer: Self-pay

## 2024-07-24 ENCOUNTER — Encounter (HOSPITAL_COMMUNITY)

## 2024-07-24 ENCOUNTER — Ambulatory Visit: Admitting: Pharmacist

## 2024-07-24 NOTE — Telephone Encounter (Signed)
 Patient c/o for 10:15 CR class, no reason given.

## 2024-07-27 ENCOUNTER — Encounter (HOSPITAL_COMMUNITY)

## 2024-07-28 NOTE — Progress Notes (Signed)
 Cardiac Individual Treatment Plan  Patient Details  Name: Lynn Estrada MRN: [id number] Date of Birth: 1961-01-05 Referring Provider:   Flowsheet Row INTENSIVE CARDIAC REHAB ORIENT from 05/21/2024 in [facility]  Referring Provider Dr. Arnie (Lynn Bihari, MD)    Initial Encounter Date:  Flowsheet Row INTENSIVE CARDIAC REHAB ORIENT from 05/21/2024 in [facility]  Date 05/21/24    Visit Diagnosis: Heart transplant recipient [facility])  Patient's Home Medications on Admission: Current Medications[1]  Past Medical History: Past Medical History:  Diagnosis Date   Anemia    Arthritis    CHF (congestive heart failure) (HCC) 12/06/2022   EF is 20% to 25 %   COPD (chronic obstructive pulmonary disease) (HCC)    Diabetes mellitus without complication (HCC)    Eczema    GERD (gastroesophageal reflux disease)    History of adenomatous polyp of colon    08-03-2016  tubular adenoma x2 and hyperplastic   History of chronic gastritis 08/03/2016   History of ectopic pregnancy 1988   s/p  left salpingectomy   History of endometriosis    History of kidney stones    History of partial nephrectomy    right pelvis for very large stone   History of pneumonia 07/02/2016   CAP   History of sepsis    07-01-2016  sepsis w/ pyelonephritis, CAP /   12-31-2016  urosepsis w/ kidney stone obstruction   History of uterine leiomyoma    Hx of adenomatous colonic polyps 08/03/2016   2 adenomas, 1 hpp and 1 lost polyp 2018 all < 1 cm    Hyperlipidemia    Hypertension    Left ureteral stone    Myocardial infarction [facility])    Nephrolithiasis    left obstructive stone and right non-obstructive stone  per CT 12-31-2016   Urgency of urination     Tobacco Use: Tobacco Use History[2]  Labs: Review Flowsheet  More data exists      Latest Ref Rng & Units 10/11/2023 11/18/2023  11/20/2023 11/21/2023 01/29/2024  Labs for ITP Cardiac and Pulmonary Rehab  Bicarbonate 20.0 - 28.0 mmol/L 25.8  25.9  27.2  26.5  - - 26.7  26.1   TCO2 22 - 32 mmol/L 27  27  28  28   - - 28  27   O2 Saturation % 60  60  45  45  53.1  63.2  47  46     Details       Multiple values from one day are sorted in reverse-chronological order         Capillary Blood Glucose: Lab Results  Component Value Date   GLUCAP 112 (H) 07/22/2024   GLUCAP 96 07/22/2024   GLUCAP 118 (H) 06/10/2024   GLUCAP 114 (H) 06/08/2024   GLUCAP 97 06/08/2024     Exercise Target Goals: Exercise Program Goal: Individual exercise prescription set using results from initial 6 min walk test and THRR while considering  patients activity barriers and safety.   Exercise Prescription Goal: Initial exercise prescription builds to 30-45 minutes a day of aerobic activity, 2-3 days per week.  Home exercise guidelines will be given to patient during program as part of exercise prescription that the participant will acknowledge.  Activity Barriers & Risk Stratification:  Activity Barriers & Cardiac Risk Stratification - 05/21/24 1204       Activity Barriers & Cardiac Risk Stratification  Activity Barriers Back Problems;Neck/Spine Problems;Joint Problems;Deconditioning;Muscular Weakness;Balance Concerns;Assistive Device    Cardiac Risk Stratification High          6 Minute Walk:  6 Minute Walk     Row Name 05/21/24 1352         6 Minute Walk   Phase Initial     Distance 952 feet     Walk Time 6 minutes     # of Rest Breaks 0     MPH 1.8     METS 3.3     RPE 10     Perceived Dyspnea  0     VO2 Peak 11.4     Symptoms No     Resting HR 89 bpm     Resting BP 130/80     Resting Oxygen Saturation  97 %     Exercise Oxygen Saturation  during 6 min walk 98 %     Max Ex. HR 105 bpm     Max Ex. BP 138/80     2 Minute Post BP 134/78        Oxygen Initial Assessment:   Oxygen  Re-Evaluation:   Oxygen Discharge (Final Oxygen Re-Evaluation):   Initial Exercise Prescription:  Initial Exercise Prescription - 05/21/24 1300       Date of Initial Exercise RX and Referring Provider   Date 05/21/24    Referring Provider Dr. Arnie Earley Bihari, MD)    Expected Discharge Date 08/12/24      NuStep   Level 1    SPM 75    Minutes 15    METs 3.3      Track   Laps 8    Minutes 15    METs 3.3      Prescription Details   Frequency (times per week) 3    Duration Progress to 30 minutes of continuous aerobic without signs/symptoms of physical distress      Intensity   THRR 40-80% of Max Heartrate 63-126    Ratings of Perceived Exertion 11-13    Perceived Dyspnea 0-4      Progression   Progression Continue progressive overload as per policy without signs/symptoms or physical distress.      Resistance Training   Training Prescription Yes    Weight 2 lbs    Reps 10-15          Perform Capillary Blood Glucose checks as needed.  Exercise Prescription Changes:   Exercise Prescription Changes     Row Name 06/08/24 1037 06/19/24 1001 07/13/24 1021 07/22/24 1007       Response to Exercise   Blood Pressure (Admit) 124/72 102/58 130/62 122/62    Blood Pressure (Exercise) 130/80 114/60 138/76 --    Blood Pressure (Exit) 112/70 118/82 122/68 138/70    Heart Rate (Admit) 97 bpm 102 bpm 90 bpm 89 bpm    Heart Rate (Exercise) 112 bpm 108 bpm 113 bpm 109 bpm    Heart Rate (Exit) 99 bpm 102 bpm 89 bpm 105 bpm    Rating of Perceived Exertion (Exercise) 11 9 9 11     Symptoms None None None None    Comments Off to a good start with exercise. -- -- --    Duration Continue with 30 min of aerobic exercise without signs/symptoms of physical distress. Continue with 30 min of aerobic exercise without signs/symptoms of physical distress. Continue with 30 min of aerobic exercise without signs/symptoms of physical distress. Continue with 30 min of aerobic exercise  without  signs/symptoms of physical distress.    Intensity THRR unchanged THRR unchanged THRR unchanged THRR unchanged      Progression   Progression Continue to progress workloads to maintain intensity without signs/symptoms of physical distress. Continue to progress workloads to maintain intensity without signs/symptoms of physical distress. Continue to progress workloads to maintain intensity without signs/symptoms of physical distress. Continue to progress workloads to maintain intensity without signs/symptoms of physical distress.    Average METs 2 2.3 2.4 2.1      Resistance Training   Training Prescription Yes Yes Yes No    Weight 2 lbs 2 lbs 2 lbs --    Reps 10-15 10-15 10-15 --    Time 5 Minutes 5 Minutes 5 Minutes --      Interval Training   Interval Training No No No No      NuStep   Level 1 1 1 1     SPM 74 77 92 91    Minutes 15 15 15 30     METs 1.9 2 2.1 2.1      Track   Laps 8 12 14  --    Minutes 15 15 15  --    METs 2.02 2.53 2.79 --      Home Exercise Plan   Plans to continue exercise at -- -- -- [employer] (comment)  Walking at home or at the gym.    Frequency -- -- -- Add 2 additional days to program exercise sessions.    Initial Home Exercises Provided -- -- -- 07/15/24       Exercise Comments:   Exercise Comments     Row Name 06/08/24 1420 07/15/24 1114         Exercise Comments Patient tolerated low intensity exercise well without symptoms. Oriented her to the exercise equipment and stretching routine. Stretching handout given. Reviewed home exercise guidelines and goals with Lynn Estrada.         Exercise Goals and Review:   Exercise Goals     Row Name 05/21/24 1108             Exercise Goals   Increase Physical Activity Yes       Intervention Develop an individualized exercise prescription for aerobic and resistive training based on initial evaluation findings, risk stratification, comorbidities and participant's personal goals.;Provide  advice, education, support and counseling about physical activity/exercise needs.       Expected Outcomes Short Term: Attend rehab on a regular basis to increase amount of physical activity.;Long Term: Add in home exercise to make exercise part of routine and to increase amount of physical activity.;Long Term: Exercising regularly at least 3-5 days a week.       Increase Strength and Stamina Yes       Intervention Provide advice, education, support and counseling about physical activity/exercise needs.;Develop an individualized exercise prescription for aerobic and resistive training based on initial evaluation findings, risk stratification, comorbidities and participant's personal goals.       Expected Outcomes Short Term: Increase workloads from initial exercise prescription for resistance, speed, and METs.;Short Term: Perform resistance training exercises routinely during rehab and add in resistance training at home;Long Term: Improve cardiorespiratory fitness, muscular endurance and strength as measured by increased METs and functional capacity ( )       Able to understand and use rate of perceived exertion (RPE) scale Yes       Intervention Provide education and explanation on how to use RPE scale       Expected Outcomes  Short Term: Able to use RPE daily in rehab to express subjective intensity level;Long Term:  Able to use RPE to guide intensity level when exercising independently       Knowledge and understanding of Target Heart Rate Range (THRR) Yes       Intervention Provide education and explanation of THRR including how the numbers were predicted and where they are located for reference       Expected Outcomes Short Term: Able to state/look up THRR;Short Term: Able to use daily as guideline for intensity in rehab;Long Term: Able to use THRR to govern intensity when exercising independently       Understanding of Exercise Prescription Yes       Intervention Provide education, explanation,  and written materials on patient's individual exercise prescription       Expected Outcomes Short Term: Able to explain program exercise prescription;Long Term: Able to explain home exercise prescription to exercise independently          Exercise Goals Re-Evaluation :  Exercise Goals Re-Evaluation     Row Name 06/08/24 1420 06/30/24 1027 07/15/24 1114         Exercise Goal Re-Evaluation   Exercise Goals Review Increase Physical Activity;Increase Strength and Stamina;Able to understand and use rate of perceived exertion (RPE) scale Increase Physical Activity;Increase Strength and Stamina;Able to understand and use rate of perceived exertion (RPE) scale Increase Physical Activity;Increase Strength and Stamina;Able to understand and use rate of perceived exertion (RPE) scale;Knowledge and understanding of Target Heart Rate Range (THRR);Understanding of Exercise Prescription     Comments Patient is able to understand and use RPE scale appropriately. Lynn Estrada has sporadic attendance. Increasing workloads as able. Reviewed exercise prescription with Lynn Estrada. She is walking at gym or outside. She also has hand weights at home.     Expected Outcomes Progress workloads as tolerated to help increase strength and stamina. Progress workloads as tolerated to help increase strength and stamina. Continue walking at home or gym. Progress workloads at cardiac rehab as tolerated.        Discharge Exercise Prescription (Final Exercise Prescription Changes):  Exercise Prescription Changes - 07/22/24 1007       Response to Exercise   Blood Pressure (Admit) 122/62    Blood Pressure (Exercise) --    Blood Pressure (Exit) 138/70    Heart Rate (Admit) 89 bpm    Heart Rate (Exercise) 109 bpm    Heart Rate (Exit) 105 bpm    Rating of Perceived Exertion (Exercise) 11    Symptoms None    Duration Continue with 30 min of aerobic exercise without signs/symptoms of physical distress.    Intensity THRR unchanged       Progression   Progression Continue to progress workloads to maintain intensity without signs/symptoms of physical distress.    Average METs 2.1      Resistance Training   Training Prescription No    Weight --    Reps --    Time --      Interval Training   Interval Training No      NuStep   Level 1    SPM 91    Minutes 30    METs 2.1      Track   Laps --    Minutes --    METs --      Home Exercise Plan   Plans to continue exercise at [employer] (comment)   Walking at home or at the gym.   Frequency Add  2 additional days to program exercise sessions.    Initial Home Exercises Provided 07/15/24          Nutrition:  Target Goals: Understanding of nutrition guidelines, daily intake of sodium 1500mg , cholesterol 200mg , calories 30% from fat and 7% or less from saturated fats, daily to have 5 or more servings of fruits and vegetables.  Biometrics:  Pre Biometrics - 05/21/24 1050       Pre Biometrics   Waist Circumference 37.5 inches    Hip Circumference 40 inches    Waist to Hip Ratio 0.94 %    Triceps Skinfold 14 mm    % Body Fat 32.4 %    Grip Strength 18 kg    Flexibility --   pt declined test   Single Leg Stand 4.12 seconds           Nutrition Therapy Plan and Nutrition Goals:  Nutrition Therapy & Goals - 06/08/24 1146       Nutrition Therapy   Diet Heart Healthy, Low Sodium, Diabetic    Drug/Food Interactions Statins/Certain Fruits      Personal Nutrition Goals   Nutrition Goal Patient to identify strategies for reducing cardiovascular risk by attending the [employer] education and nutrition series weekly.    Personal Goal #2 Patient to identify strategies for blood sugar control with goal A1c <7%.    Comments Pt with medical history significant for NICM s/p OHT 02/13/2024, significant rejection history 02/21/2024, HTN, dyslipidemia, T2DM, stage III CKD. Pt endorses following low sodium, heart healthy, diabetic diet. Nutritionally pertinent labs  on 06/02/2024 include BUN 35, Cr 1.4, eGFR 43; elevated A1c of 8% on 04/25/2024. Pt states blood glucose stays mostly within appropriate range; attributes elevated blood glucose to steroids. Patient will benefit from participation in intensive cardiac rehab for nutrition education, exercise, and lifestyle modification.      Intervention Plan   Intervention Prescribe, educate and counsel regarding individualized specific dietary modifications aiming towards targeted core components such as weight, hypertension, lipid management, diabetes, heart failure and other comorbidities.    Expected Outcomes Short Term Goal: Understand basic principles of dietary content, such as calories, fat, sodium, cholesterol and nutrients.;Long Term Goal: Adherence to prescribed nutrition plan.          Nutrition Assessments:  MEDIFICTS Score Key: >=70 Need to make dietary changes  40-70 Heart Healthy Diet <= 40 Therapeutic Level Cholesterol Diet   Flowsheet Row CARDIAC REHAB PHASE II EXERCISE from 03/16/2022 in [facility]  Picture Your Plate Total Score on Discharge 72   Picture Your Plate Scores: <59 Unhealthy dietary pattern with much room for improvement. 41-50 Dietary pattern unlikely to meet recommendations for good health and room for improvement. 51-60 More healthful dietary pattern, with some room for improvement.  >60 Healthy dietary pattern, although there may be some specific behaviors that could be improved.    Nutrition Goals Re-Evaluation:  Nutrition Goals Re-Evaluation     Row Name 07/06/24 1228             Goals   Nutrition Goal Patient to identify strategies for reducing cardiovascular risk by attending the [employer] education and nutrition series weekly.       Comment Nutritonally pertinent labs on 06/30/24 include Cr 1.3, eGFR 46. Weight gain of 2.7 kg (3.3%) since starting cardiac rehab.       Expected Outcome Goals in  progress. Pt with medical history significant for NICM s/p OHT 02/13/2024,  significant rejection history 02/21/2024, HTN, dyslipidemia, T2DM, stage III CKD. Pt endorses following low sodium, heart healthy, diabetic diet. Weight remains within ideal range. Renal labs remain relatively stable. Pt endorses appropriate glucose control. Reports limiting high sodium foods; most meals prepared at home. Pt currently participating in [facility] education and nutrition series. Patient will benefit from ongoing participation in intensive cardiac rehab for nutrition education, exercise, and lifestyle modification.         Personal Goal #2 Re-Evaluation   Personal Goal #2 Patient to identify strategies for blood sugar control with goal A1c <7%.          Nutrition Goals Re-Evaluation:  Nutrition Goals Re-Evaluation     Row Name 07/06/24 1228             Goals   Nutrition Goal Patient to identify strategies for reducing cardiovascular risk by attending the [employer] education and nutrition series weekly.       Comment Nutritonally pertinent labs on 06/30/24 include Cr 1.3, eGFR 46. Weight gain of 2.7 kg (3.3%) since starting cardiac rehab.       Expected Outcome Goals in progress. Pt with medical history significant for NICM s/p OHT 02/13/2024, significant rejection history 02/21/2024, HTN, dyslipidemia, T2DM, stage III CKD. Pt endorses following low sodium, heart healthy, diabetic diet. Weight remains within ideal range. Renal labs remain relatively stable. Pt endorses appropriate glucose control. Reports limiting high sodium foods; most meals prepared at home. Pt currently participating in [facility] education and nutrition series. Patient will benefit from ongoing participation in intensive cardiac rehab for nutrition education, exercise, and lifestyle modification.         Personal Goal #2 Re-Evaluation   Personal Goal #2 Patient to identify strategies for blood sugar control with goal A1c <7%.           Nutrition Goals Discharge (Final Nutrition Goals Re-Evaluation):  Nutrition Goals Re-Evaluation - 07/06/24 1228       Goals   Nutrition Goal Patient to identify strategies for reducing cardiovascular risk by attending the [facility] education and nutrition series weekly.    Comment Nutritonally pertinent labs on 06/30/24 include Cr 1.3, eGFR 46. Weight gain of 2.7 kg (3.3%) since starting cardiac rehab.    Expected Outcome Goals in progress. Pt with medical history significant for NICM s/p OHT 02/13/2024, significant rejection history 02/21/2024, HTN, dyslipidemia, T2DM, stage III CKD. Pt endorses following low sodium, heart healthy, diabetic diet. Weight remains within ideal range. Renal labs remain relatively stable. Pt endorses appropriate glucose control. Reports limiting high sodium foods; most meals prepared at home. Pt currently participating in [facility] education and nutrition series. Patient will benefit from ongoing participation in intensive cardiac rehab for nutrition education, exercise, and lifestyle modification.      Personal Goal #2 Re-Evaluation   Personal Goal #2 Patient to identify strategies for blood sugar control with goal A1c <7%.          Psychosocial: Target Goals: Acknowledge presence or absence of significant depression and/or stress, maximize coping skills, provide positive support system. Participant is able to verbalize types and ability to use techniques and skills needed for reducing stress and depression.  Initial Review & Psychosocial Screening:  Initial Psych Review & Screening - 05/21/24 1116       Initial Review   Current issues with Current Sleep Concerns;Current Stress Concerns;Current Anxiety/Panic    Source of Stress Concerns Chronic Illness    Comments Will continue to monitor and offer support as needed  Family Dynamics   Good Support System? Yes   Pt has her spouse     Barriers   Psychosocial barriers to participate in program The  patient should benefit from training in stress management and relaxation.      Screening Interventions   Interventions Encouraged to exercise;Provide feedback about the scores to participant    Expected Outcomes Short Term goal: Identification and review with participant of any Quality of Life or Depression concerns found by scoring the questionnaire.;Long Term Goal: Stressors or current issues are controlled or eliminated.;Long Term goal: The participant improves quality of Life and PHQ9 Scores as seen by post scores and/or verbalization of changes          Quality of Life Scores:  Quality of Life - 05/21/24 1358       Quality of Life   Select Quality of Life      Quality of Life Scores   Health/Function Pre 24.29 %    Socioeconomic Pre 28.33 %    Psych/Spiritual Pre 28.29 %    Family Pre 27.6 %    GLOBAL Pre 26.44 %         Scores of 19 and below usually indicate a poorer quality of life in these areas.  A difference of  2-3 points is a clinically meaningful difference.  A difference of 2-3 points in the total score of the Quality of Life Index has been associated with significant improvement in overall quality of life, self-image, physical symptoms, and general health in studies assessing change in quality of life.  PHQ-9: Review Flowsheet  More data exists      05/21/2024 03/16/2022 01/11/2022 10/29/2017 09/11/2017  Depression screen PHQ 2/9  Decreased Interest 0 0 0 0 0  Down, Depressed, Hopeless 0 0 1 0 0  PHQ - 2 Score 0 0 1 0 0  Altered sleeping 2 - 0 - -  Tired, decreased energy 2 - 1 - -  Change in appetite 1 - 0 - -  Feeling bad or failure about yourself  0 - 0 - -  Trouble concentrating 0 - 0 - -  Moving slowly or fidgety/restless 0 - 0 - -  Suicidal thoughts 0 - 0 - -  PHQ-9 Score 5 - 2  - -  Difficult doing work/chores Somewhat difficult - Somewhat difficult - -    Details       Data saved with a previous flowsheet row definition        Interpretation  of Total Score  Total Score Depression Severity:  1-4 = Minimal depression, 5-9 = Mild depression, 10-14 = Moderate depression, 15-19 = Moderately severe depression, 20-27 = Severe depression   Psychosocial Evaluation and Intervention:   Psychosocial Re-Evaluation:  Psychosocial Re-Evaluation     Row Name 06/08/24 1642 06/30/24 1305 07/28/24 1257         Psychosocial Re-Evaluation   Current issues with Current Sleep Concerns;Current Stress Concerns;Current Anxiety/Panic Current Sleep Concerns;Current Stress Concerns;Current Anxiety/Panic Current Sleep Concerns;Current Stress Concerns;Current Anxiety/Panic     Comments Lynn Estrada started cardiac rehab on 06/08/24. Lynn Estrada did not voice any increased concerns or stressors on her first day of exercise. Will review PHQ9 in the upcoming week. Lynn Estrada started cardiac rehab on 06/08/24. Lynn Estrada has not voiced any increased concerns or stressors during exercise at cardiac rehab . Will review PHQ9 in the near future. Lynn Estrada has only been to a few sessions PHQ9 reviewed. Lynn Estrada says she has not been sleeeping welland still has low  energy after recently gettting over a cold. Cardiac rehab has been helpful for [facility]. Lynn Estrada says she has a good appetite. Discussed sleep hygiene. Lynn Estrada says she has taken melatonin in the past. I encourged Lynn Estrada to make sure this is okay either with her PCP or transplant coordinator.     Expected Outcomes Lynn Estrada will have deecreased or controlled stressors upon compleiton of cardiac rehab Lynn Estrada will have deecreased or controlled stressors upon compleiton of cardiac rehab Lynn Estrada will have deecreased or controlled stressors upon compleiton of cardiac rehab     Interventions Stress management education;Relaxation education;Encouraged to attend Cardiac Rehabilitation for the exercise Stress management education;Relaxation education;Encouraged to attend Cardiac Rehabilitation for the exercise Stress management education;Relaxation  education;Encouraged to attend Cardiac Rehabilitation for the exercise     Continue Psychosocial Services  Follow up required by staff Follow up required by staff Follow up required by staff       Initial Review   Source of Stress Concerns Chronic Illness Chronic Illness Chronic Illness     Comments Will continue to monitor and offer support as needed Will continue to monitor and offer support as needed Will continue to monitor and offer support as needed        Psychosocial Discharge (Final Psychosocial Re-Evaluation):  Psychosocial Re-Evaluation - 07/28/24 1257       Psychosocial Re-Evaluation   Current issues with Current Sleep Concerns;Current Stress Concerns;Current Anxiety/Panic    Comments PHQ9 reviewed. Lynn Estrada says she has not been sleeeping welland still has low energy after recently gettting over a cold. Cardiac rehab has been helpful for [facility]. Lynn Estrada says she has a good appetite. Discussed sleep hygiene. Lynn Estrada says she has taken melatonin in the past. I encourged Lynn Estrada to make sure this is okay either with her PCP or transplant coordinator.    Expected Outcomes Lynn Estrada will have deecreased or controlled stressors upon compleiton of cardiac rehab    Interventions Stress management education;Relaxation education;Encouraged to attend Cardiac Rehabilitation for the exercise    Continue Psychosocial Services  Follow up required by staff      Initial Review   Source of Stress Concerns Chronic Illness    Comments Will continue to monitor and offer support as needed          Vocational Rehabilitation: Provide vocational rehab assistance to qualifying candidates.   Vocational Rehab Evaluation & Intervention:  Vocational Rehab - 05/21/24 1118       Initial Vocational Rehab Evaluation & Intervention   Assessment shows need for Vocational Rehabilitation --   Pt is retired         Education: Education Goals: Education classes will be provided on a weekly basis, covering required  topics. Participant will state understanding/return demonstration of topics presented.    Education     Row Name 06/08/24 1000     Education   Cardiac Education Topics --   Select --     Core Videos   Educator --   Select --   Exercise Education --   Instruction Review Code --    Row Name 06/10/24 1100     Education   Cardiac Education Topics Pritikin   Secondary School Teacher School   Educator Dietitian   Weekly Topic Fast Evening Meals   Instruction Review Code 1- Verbalizes Understanding   Class Start Time 1148   Class Stop Time 1232   Class Time Calculation (min) 44 min    Row Name 06/17/24 1000  Education   Cardiac Education Topics Pritikin   Customer Service Manager   Weekly Topic International Cuisine- Spotlight on the [employer] Zones   Instruction Review Code 1- Verbalizes Understanding   Class Start Time 1145   Class Stop Time 1224   Class Time Calculation (min) 39 min    Row Name 07/01/24 1200     Education   Cardiac Education Topics Pritikin   Orthoptist   Educator Dietitian   Weekly Topic Powerhouse Plant-Based Proteins   Instruction Review Code 1- Verbalizes Understanding   Class Start Time 1145   Class Stop Time 1217   Class Time Calculation (min) 32 min    Row Name 07/15/24 1100     Education   Cardiac Education Topics Pritikin   Customer Service Manager   Weekly Topic Adding Flavor - Sodium-Free   Instruction Review Code 1- Verbalizes Understanding   Class Start Time 1146   Class Stop Time 1220   Class Time Calculation (min) 34 min    Row Name 07/22/24 1100     Education   Cardiac Education Topics Pritikin   Customer Service Manager   Weekly Topic Fast and Healthy Breakfasts   Instruction Review Code 1- Verbalizes Understanding   Class Start Time 1145   Class Stop Time  1224   Class Time Calculation (min) 39 min      Core Videos: Exercise    Move It!  Clinical staff conducted group or individual video education with verbal and written material and guidebook.  Patient learns the recommended Pritikin exercise program. Exercise with the goal of living a long, healthy life. Some of the health benefits of exercise include controlled diabetes, healthier blood pressure levels, improved cholesterol levels, improved heart and lung capacity, improved sleep, and better body composition. Everyone should speak with their doctor before starting or changing an exercise routine.  Biomechanical Limitations Clinical staff conducted group or individual video education with verbal and written material and guidebook.  Patient learns how biomechanical limitations can impact exercise and how we can mitigate and possibly overcome limitations to have an impactful and balanced exercise routine.  Body Composition Clinical staff conducted group or individual video education with verbal and written material and guidebook.  Patient learns that body composition (ratio of muscle mass to fat mass) is a key component to assessing overall fitness, rather than body weight alone. Increased fat mass, especially visceral belly fat, can put us  at increased risk for metabolic syndrome, type 2 diabetes, heart disease, and even death. It is recommended to combine diet and exercise (cardiovascular and resistance training) to improve your body composition. Seek guidance from your physician and exercise physiologist before implementing an exercise routine.  Exercise Action Plan Clinical staff conducted group or individual video education with verbal and written material and guidebook.  Patient learns the recommended strategies to achieve and enjoy long-term exercise adherence, including variety, self-motivation, self-efficacy, and positive decision making. Benefits of exercise include fitness, good health,  weight management, more energy, better sleep, less stress, and overall well-being.  Medical   Heart Disease Risk Reduction Clinical staff conducted group or individual video education with verbal and written material and guidebook.  Patient learns our heart is our most vital organ as it circulates oxygen, nutrients, white blood cells, and hormones  throughout the entire body, and carries waste away. Data supports a plant-based eating plan like the Pritikin Program for its effectiveness in slowing progression of and reversing heart disease. The video provides a number of recommendations to address heart disease.   Metabolic Syndrome and Belly Fat  Clinical staff conducted group or individual video education with verbal and written material and guidebook.  Patient learns what metabolic syndrome is, how it leads to heart disease, and how one can reverse it and keep it from coming back. You have metabolic syndrome if you have 3 of the following 5 criteria: abdominal obesity, high blood pressure, high triglycerides, low HDL cholesterol, and high blood sugar.  Hypertension and Heart Disease Clinical staff conducted group or individual video education with verbal and written material and guidebook.  Patient learns that high blood pressure, or hypertension, is very common in the [place] . Hypertension is largely due to excessive salt intake, but other important risk factors include being overweight, physical inactivity, drinking too much alcohol, smoking, and not eating enough potassium from fruits and vegetables. High blood pressure is a leading risk factor for heart attack, stroke, congestive heart failure, dementia, kidney failure, and premature death. Long-term effects of excessive salt intake include stiffening of the arteries and thickening of heart muscle and organ damage. Recommendations include ways to reduce hypertension and the risk of heart disease.  Diseases of Our Time - Focusing on  Diabetes Clinical staff conducted group or individual video education with verbal and written material and guidebook.  Patient learns why the best way to stop diseases of our time is prevention, through food and other lifestyle changes. Medicine (such as prescription pills and surgeries) is often only a Band-Aid on the problem, not a long-term solution. Most common diseases of our time include obesity, type 2 diabetes, hypertension, heart disease, and cancer. The Pritikin Program is recommended and has been proven to help reduce, reverse, and/or prevent the damaging effects of metabolic syndrome.  Nutrition   Overview of the Pritikin Eating Plan  Clinical staff conducted group or individual video education with verbal and written material and guidebook.  Patient learns about the Pritikin Eating Plan for disease risk reduction. The Pritikin Eating Plan emphasizes a wide variety of unrefined, minimally-processed carbohydrates, like fruits, vegetables, whole grains, and legumes. Go, Caution, and Stop food choices are explained. Plant-based and lean animal proteins are emphasized. Rationale provided for low sodium intake for blood pressure control, low added sugars for blood sugar stabilization, and low added fats and oils for coronary artery disease risk reduction and weight management.  Calorie Density  Clinical staff conducted group or individual video education with verbal and written material and guidebook.  Patient learns about calorie density and how it impacts the Pritikin Eating Plan. Knowing the characteristics of the food you choose will help you decide whether those foods will lead to weight gain or weight loss, and whether you want to consume more or less of them. Weight loss is usually a side effect of the Pritikin Eating Plan because of its focus on low calorie-dense foods.  Label Reading  Clinical staff conducted group or individual video education with verbal and written material and  guidebook.  Patient learns about the Pritikin recommended label reading guidelines and corresponding recommendations regarding calorie density, added sugars, sodium content, and whole grains.  Dining Out - Part 1  Clinical staff conducted group or individual video education with verbal and written material and guidebook.  Patient learns that restaurant meals can  be sabotaging because they can be so high in calories, fat, sodium, and/or sugar. Patient learns recommended strategies on how to positively address this and avoid unhealthy pitfalls.  Facts on Fats  Clinical staff conducted group or individual video education with verbal and written material and guidebook.  Patient learns that lifestyle modifications can be just as effective, if not more so, as many medications for lowering your risk of heart disease. A Pritikin lifestyle can help to reduce your risk of inflammation and atherosclerosis (cholesterol build-up, or plaque, in the artery walls). Lifestyle interventions such as dietary choices and physical activity address the cause of atherosclerosis. A review of the types of fats and their impact on blood cholesterol levels, along with dietary recommendations to reduce fat intake is also included.  Nutrition Action Plan  Clinical staff conducted group or individual video education with verbal and written material and guidebook.  Patient learns how to incorporate Pritikin recommendations into their lifestyle. Recommendations include planning and keeping personal health goals in mind as an important part of their success.  Healthy Mind-Set    Healthy Minds, Bodies, Hearts  Clinical staff conducted group or individual video education with verbal and written material and guidebook.  Patient learns how to identify when they are stressed. Video will discuss the impact of that stress, as well as the many benefits of stress management. Patient will also be introduced to stress management techniques.  The way we think, act, and feel has an impact on our hearts.  How Our Thoughts Can Heal Our Hearts  Clinical staff conducted group or individual video education with verbal and written material and guidebook.  Patient learns that negative thoughts can cause depression and anxiety. This can result in negative lifestyle behavior and serious health problems. Cognitive behavioral therapy is an effective method to help control our thoughts in order to change and improve our emotional outlook.  Additional Videos:  Exercise    Improving Performance  Clinical staff conducted group or individual video education with verbal and written material and guidebook.  Patient learns to use a non-linear approach by alternating intensity levels and lengths of time spent exercising to help burn more calories and lose more body fat. Cardiovascular exercise helps improve heart health, metabolism, hormonal balance, blood sugar control, and recovery from fatigue. Resistance training improves strength, endurance, balance, coordination, reaction time, metabolism, and muscle mass. Flexibility exercise improves circulation, posture, and balance. Seek guidance from your physician and exercise physiologist before implementing an exercise routine and learn your capabilities and proper form for all exercise.  Introduction to Yoga  Clinical staff conducted group or individual video education with verbal and written material and guidebook.  Patient learns about yoga, a discipline of the coming together of mind, breath, and body. The benefits of yoga include improved flexibility, improved range of motion, better posture and core strength, increased lung function, weight loss, and positive self-image. Yogas heart health benefits include lowered blood pressure, healthier heart rate, decreased cholesterol and triglyceride levels, improved immune function, and reduced stress. Seek guidance from your physician and exercise physiologist  before implementing an exercise routine and learn your capabilities and proper form for all exercise.  Medical   Aging: Enhancing Your Quality of Life  Clinical staff conducted group or individual video education with verbal and written material and guidebook.  Patient learns key strategies and recommendations to stay in good physical health and enhance quality of life, such as prevention strategies, having an advocate, securing a Health Care Proxy and  [employer] [place], and keeping a list of medications and system for tracking them. It also discusses how to avoid risk for bone loss.  Biology of Weight Control  Clinical staff conducted group or individual video education with verbal and written material and guidebook.  Patient learns that weight gain occurs because we consume more calories than we burn (eating more, moving less). Even if your body weight is normal, you may have higher ratios of fat compared to muscle mass. Too much body fat puts you at increased risk for cardiovascular disease, heart attack, stroke, type 2 diabetes, and obesity-related cancers. In addition to exercise, following the Pritikin Eating Plan can help reduce your risk.  Decoding Lab Results  Clinical staff conducted group or individual video education with verbal and written material and guidebook.  Patient learns that lab test reflects one measurement whose values change over time and are influenced by many factors, including medication, stress, sleep, exercise, food, hydration, pre-existing medical conditions, and more. It is recommended to use the knowledge from this video to become more involved with your lab results and evaluate your numbers to speak with your doctor.   Diseases of Our Time - Overview  Clinical staff conducted group or individual video education with verbal and written material and guidebook.  Patient learns that according to the CDC, 50% to 70% of chronic diseases (such as obesity, type 2 diabetes,  elevated lipids, hypertension, and heart disease) are avoidable through lifestyle improvements including healthier food choices, listening to satiety cues, and increased physical activity.  Sleep Disorders Clinical staff conducted group or individual video education with verbal and written material and guidebook.  Patient learns how good quality and duration of sleep are important to overall health and well-being. Patient also learns about sleep disorders and how they impact health along with recommendations to address them, including discussing with a physician.  Nutrition  Dining Out - Part 2 Clinical staff conducted group or individual video education with verbal and written material and guidebook.  Patient learns how to plan ahead and communicate in order to maximize their dining experience in a healthy and nutritious manner. Included are recommended food choices based on the type of restaurant the patient is visiting.   Fueling a Banker conducted group or individual video education with verbal and written material and guidebook.  There is a strong connection between our food choices and our health. Diseases like obesity and type 2 diabetes are very prevalent and are in large-part due to lifestyle choices. The Pritikin Eating Plan provides plenty of food and hunger-curbing satisfaction. It is easy to follow, affordable, and helps reduce health risks.  Menu Workshop  Clinical staff conducted group or individual video education with verbal and written material and guidebook.  Patient learns that restaurant meals can sabotage health goals because they are often packed with calories, fat, sodium, and sugar. Recommendations include strategies to plan ahead and to communicate with the manager, chef, or server to help order a healthier meal.  Planning Your Eating Strategy  Clinical staff conducted group or individual video education with verbal and written material and  guidebook.  Patient learns about the Pritikin Eating Plan and its benefit of reducing the risk of disease. The Pritikin Eating Plan does not focus on calories. Instead, it emphasizes high-quality, nutrient-rich foods. By knowing the characteristics of the foods, we choose, we can determine their calorie density and make informed decisions.  Targeting Your Nutrition Priorities  Clinical staff conducted group  or individual video education with verbal and written material and guidebook.  Patient learns that lifestyle habits have a tremendous impact on disease risk and progression. This video provides eating and physical activity recommendations based on your personal health goals, such as reducing LDL cholesterol, losing weight, preventing or controlling type 2 diabetes, and reducing high blood pressure.  Vitamins and Minerals  Clinical staff conducted group or individual video education with verbal and written material and guidebook.  Patient learns different ways to obtain key vitamins and minerals, including through a recommended healthy diet. It is important to discuss all supplements you take with your doctor.   Healthy Mind-Set    Smoking Cessation  Clinical staff conducted group or individual video education with verbal and written material and guidebook.  Patient learns that cigarette smoking and tobacco addiction pose a serious health risk which affects millions of people. Stopping smoking will significantly reduce the risk of heart disease, lung disease, and many forms of cancer. Recommended strategies for quitting are covered, including working with your doctor to develop a successful plan.  Culinary   Becoming a Set Designer conducted group or individual video education with verbal and written material and guidebook.  Patient learns that cooking at home can be healthy, cost-effective, quick, and puts them in control. Keys to cooking healthy recipes will include looking  at your recipe, assessing your equipment needs, planning ahead, making it simple, choosing cost-effective seasonal ingredients, and limiting the use of added fats, salts, and sugars.  Cooking - Breakfast and Snacks  Clinical staff conducted group or individual video education with verbal and written material and guidebook.  Patient learns how important breakfast is to satiety and nutrition through the entire day. Recommendations include key foods to eat during breakfast to help stabilize blood sugar levels and to prevent overeating at meals later in the day. Planning ahead is also a key component.  Cooking - Educational Psychologist conducted group or individual video education with verbal and written material and guidebook.  Patient learns eating strategies to improve overall health, including an approach to cook more at home. Recommendations include thinking of animal protein as a side on your plate rather than center stage and focusing instead on lower calorie dense options like vegetables, fruits, whole grains, and plant-based proteins, such as beans. Making sauces in large quantities to freeze for later and leaving the skin on your vegetables are also recommended to maximize your experience.  Cooking - Healthy Salads and Dressing Clinical staff conducted group or individual video education with verbal and written material and guidebook.  Patient learns that vegetables, fruits, whole grains, and legumes are the foundations of the Pritikin Eating Plan. Recommendations include how to incorporate each of these in flavorful and healthy salads, and how to create homemade salad dressings. Proper handling of ingredients is also covered. Cooking - Soups and [employer] - Soups and Desserts Clinical staff conducted group or individual video education with verbal and written material and guidebook.  Patient learns that Pritikin soups and desserts make for easy, nutritious, and delicious snacks  and meal components that are low in sodium, fat, sugar, and calorie density, while high in vitamins, minerals, and filling fiber. Recommendations include simple and healthy ideas for soups and desserts.   Overview     The Pritikin Solution Program Overview Clinical staff conducted group or individual video education with verbal and written material and guidebook.  Patient learns that the results of the  Pritikin Program have been documented in more than 100 articles published in peer-reviewed journals, and the benefits include reducing risk factors for (and, in some cases, even reversing) high cholesterol, high blood pressure, type 2 diabetes, obesity, and more! An overview of the three key pillars of the Pritikin Program will be covered: eating well, doing regular exercise, and having a healthy mind-set.  WORKSHOPS  Exercise: Exercise Basics: Building Your Action Plan Clinical staff led group instruction and group discussion with PowerPoint presentation and patient guidebook. To enhance the learning environment the use of posters, models and videos may be added. At the conclusion of this workshop, patients will comprehend the difference between physical activity and exercise, as well as the benefits of incorporating both, into their routine. Patients will understand the FITT (Frequency, Intensity, Time, and Type) principle and how to use it to build an exercise action plan. In addition, safety concerns and other considerations for exercise and cardiac rehab will be addressed by the presenter. The purpose of this lesson is to promote a comprehensive and effective weekly exercise routine in order to improve patients overall level of fitness.   Managing Heart Disease: Your Path to a Healthier Heart Clinical staff led group instruction and group discussion with PowerPoint presentation and patient guidebook. To enhance the learning environment the use of posters, models and videos may be added.At the  conclusion of this workshop, patients will understand the anatomy and physiology of the heart. Additionally, they will understand how Pritikins three pillars impact the risk factors, the progression, and the management of heart disease.  The purpose of this lesson is to provide a high-level overview of the heart, heart disease, and how the Pritikin lifestyle positively impacts risk factors.  Exercise Biomechanics Clinical staff led group instruction and group discussion with PowerPoint presentation and patient guidebook. To enhance the learning environment the use of posters, models and videos may be added. Patients will learn how the structural parts of their bodies function and how these functions impact their daily activities, movement, and exercise. Patients will learn how to promote a neutral spine, learn how to manage pain, and identify ways to improve their physical movement in order to promote healthy living. The purpose of this lesson is to expose patients to common physical limitations that impact physical activity. Participants will learn practical ways to adapt and manage aches and pains, and to minimize their effect on regular exercise. Patients will learn how to maintain good posture while sitting, walking, and lifting.  Balance Training and Fall Prevention  Clinical staff led group instruction and group discussion with PowerPoint presentation and patient guidebook. To enhance the learning environment the use of posters, models and videos may be added. At the conclusion of this workshop, patients will understand the importance of their sensorimotor skills (vision, proprioception, and the vestibular system) in maintaining their ability to balance as they age. Patients will apply a variety of balancing exercises that are appropriate for their current level of function. Patients will understand the common causes for poor balance, possible solutions to these problems, and ways to  modify their physical environment in order to minimize their fall risk. The purpose of this lesson is to teach patients about the importance of maintaining balance as they age and ways to minimize their risk of falling.  WORKSHOPS   Nutrition:  Fueling a Ship Broker led group instruction and group discussion with PowerPoint presentation and patient guidebook. To enhance the learning environment the use of posters, models and  videos may be added. Patients will review the foundational principles of the Pritikin Eating Plan and understand what constitutes a serving size in each of the food groups. Patients will also learn Pritikin-friendly foods that are better choices when away from home and review make-ahead meal and snack options. Calorie density will be reviewed and applied to three nutrition priorities: weight maintenance, weight loss, and weight gain. The purpose of this lesson is to reinforce (in a group setting) the key concepts around what patients are recommended to eat and how to apply these guidelines when away from home by planning and selecting Pritikin-friendly options. Patients will understand how calorie density may be adjusted for different weight management goals.  Mindful Eating  Clinical staff led group instruction and group discussion with PowerPoint presentation and patient guidebook. To enhance the learning environment the use of posters, models and videos may be added. Patients will briefly review the concepts of the Pritikin Eating Plan and the importance of low-calorie dense foods. The concept of mindful eating will be introduced as well as the importance of paying attention to internal hunger signals. Triggers for non-hunger eating and techniques for dealing with triggers will be explored. The purpose of this lesson is to provide patients with the opportunity to review the basic principles of the Pritikin Eating Plan, discuss the value of eating mindfully and how to  measure internal cues of hunger and fullness using the Hunger Scale. Patients will also discuss reasons for non-hunger eating and learn strategies to use for controlling emotional eating.  Targeting Your Nutrition Priorities Clinical staff led group instruction and group discussion with PowerPoint presentation and patient guidebook. To enhance the learning environment the use of posters, models and videos may be added. Patients will learn how to determine their genetic susceptibility to disease by reviewing their family history. Patients will gain insight into the importance of diet as part of an overall healthy lifestyle in mitigating the impact of genetics and other environmental insults. The purpose of this lesson is to provide patients with the opportunity to assess their personal nutrition priorities by looking at their family history, their own health history and current risk factors. Patients will also be able to discuss ways of prioritizing and modifying the Pritikin Eating Plan for their highest risk areas  Menu  Clinical staff led group instruction and group discussion with PowerPoint presentation and patient guidebook. To enhance the learning environment the use of posters, models and videos may be added. Using menus brought in from [employer], or printed from [employer], patients will apply the Pritikin dining out guidelines that were presented in the [employer] video. Patients will also be able to practice these guidelines in a variety of provided scenarios. The purpose of this lesson is to provide patients with the opportunity to practice hands-on learning of the Pritikin Dining Out guidelines with actual menus and practice scenarios.  Label Reading Clinical staff led group instruction and group discussion with PowerPoint presentation and patient guidebook. To enhance the learning environment the use of posters, models and videos may be added. Patients will review  and discuss the Pritikin label reading guidelines presented in [facility]ins Label Reading Educational series video. Using fool labels brought in from local grocery stores and markets, patients will apply the label reading guidelines and determine if the packaged food meet the Pritikin guidelines. The purpose of this lesson is to provide patients with the opportunity to review, discuss, and practice hands-on learning of the Pritikin Label Reading  guidelines with actual packaged food labels. Cooking School  [facility]rit[facility] [employer] are designed to teach patients ways to prepare quick, simple, and affordable recipes at home. The importance of nutritions role in chronic disease risk reduction is reflected in its emphasis in the overall Pritikin program. By learning how to prepare essential core Pritikin Eating Plan recipes, patients will increase control over what they eat; be able to customize the flavor of foods without the use of added salt, sugar, or fat; and improve the quality of the food they consume. By learning a set of core recipes which are easily assembled, quickly prepared, and affordable, patients are more likely to prepare more healthy foods at home. These workshops focus on convenient breakfasts, simple entres, side dishes, and desserts which can be prepared with minimal effort and are consistent with nutrition recommendations for cardiovascular risk reduction. Cooking [employer] are taught by a armed forces logistics/support/administrative officer (RD) who has been trained by the [employer]. The chef or RD has a clear understanding of the importance of minimizing - if not completely eliminating - added fat, sugar, and sodium in recipes. Throughout the series of Cooking School Workshop sessions, patients will learn about healthy ingredients and efficient methods of cooking to build confidence in their capability to prepare    Cooking School weekly topics:  Adding Flavor- Sodium-Free  Fast  and Healthy Breakfasts  Powerhouse Plant-Based Proteins  Satisfying Salads and Dressings  Simple Sides and Sauces  International Cuisine-Spotlight on the [employer] Zones  Delicious Desserts  Savory Soups  [employer] - Meals in a Astronomer Appetizers and Snacks  Comforting Weekend Breakfasts  One-Pot Wonders   Fast Evening Meals  Landscape Architect Your Pritikin Plate  WORKSHOPS   Healthy Mindset (Psychosocial):  Focused Goals, Sustainable Changes Clinical staff led group instruction and group discussion with PowerPoint presentation and patient guidebook. To enhance the learning environment the use of posters, models and videos may be added. Patients will be able to apply effective goal setting strategies to establish at least one personal goal, and then take consistent, meaningful action toward that goal. They will learn to identify common barriers to achieving personal goals and develop strategies to overcome them. Patients will also gain an understanding of how our mind-set can impact our ability to achieve goals and the importance of cultivating a positive and growth-oriented mind-set. The purpose of this lesson is to provide patients with a deeper understanding of how to set and achieve personal goals, as well as the tools and strategies needed to overcome common obstacles which may arise along the way.  From Head to Heart: The Power of a Healthy Outlook  Clinical staff led group instruction and group discussion with PowerPoint presentation and patient guidebook. To enhance the learning environment the use of posters, models and videos may be added. Patients will be able to recognize and describe the impact of emotions and mood on physical health. They will discover the importance of self-care and explore self-care practices which may work for them. Patients will also learn how to utilize the 4 Cs to cultivate a healthier outlook and better manage stress and  challenges. The purpose of this lesson is to demonstrate to patients how a healthy outlook is an essential part of maintaining good health, especially as they continue their cardiac rehab journey.  Healthy Sleep for a Healthy Heart Clinical staff led group instruction and group discussion with PowerPoint presentation and patient guidebook. To enhance the  learning environment the use of posters, models and videos may be added. At the conclusion of this workshop, patients will be able to demonstrate knowledge of the importance of sleep to overall health, well-being, and quality of life. They will understand the symptoms of, and treatments for, common sleep disorders. Patients will also be able to identify daytime and nighttime behaviors which impact sleep, and they will be able to apply these tools to help manage sleep-related challenges. The purpose of this lesson is to provide patients with a general overview of sleep and outline the importance of quality sleep. Patients will learn about a few of the most common sleep disorders. Patients will also be introduced to the concept of sleep hygiene, and discover ways to self-manage certain sleeping problems through simple daily behavior changes. Finally, the workshop will motivate patients by clarifying the links between quality sleep and their goals of heart-healthy living.   Recognizing and Reducing Stress Clinical staff led group instruction and group discussion with PowerPoint presentation and patient guidebook. To enhance the learning environment the use of posters, models and videos may be added. At the conclusion of this workshop, patients will be able to understand the types of stress reactions, differentiate between acute and chronic stress, and recognize the impact that chronic stress has on their health. They will also be able to apply different coping mechanisms, such as reframing negative self-talk. Patients will have the opportunity to practice a  variety of stress management techniques, such as deep abdominal breathing, progressive muscle relaxation, and/or guided imagery.  The purpose of this lesson is to educate patients on the role of stress in their lives and to provide healthy techniques for coping with it.  Learning Barriers/Preferences:  Learning Barriers/Preferences - 05/21/24 1118       Learning Barriers/Preferences   Learning Barriers Sight    Learning Preferences Written Material;Audio;Computer/Internet;Group Instruction;Individual Instruction;Pictoral;Skilled Demonstration;Verbal Instruction;Video          Education Topics:  Knowledge Questionnaire Score:  Knowledge Questionnaire Score - 05/21/24 1124       Knowledge Questionnaire Score   Pre Score 21/24          Core Components/Risk Factors/Patient Goals at Admission:  Personal Goals and Risk Factors at Admission - 05/21/24 1118       Core Components/Risk Factors/Patient Goals on Admission   Diabetes Yes    Intervention Provide education about signs/symptoms and action to take for hypo/hyperglycemia.;Provide education about proper nutrition, including hydration, and aerobic/resistive exercise prescription along with prescribed medications to achieve blood glucose in normal ranges: Fasting glucose 65-99 mg/dL    Expected Outcomes Short Term: Participant verbalizes understanding of the signs/symptoms and immediate care of hyper/hypoglycemia, proper foot care and importance of medication, aerobic/resistive exercise and nutrition plan for blood glucose control.;Long Term: Attainment of HbA1C < 7%.    Hypertension Yes    Intervention Provide education on lifestyle modifcations including regular physical activity/exercise, weight management, moderate sodium restriction and increased consumption of fresh fruit, vegetables, and low fat dairy, alcohol moderation, and smoking cessation.;Monitor prescription use compliance.    Expected Outcomes Short Term: Continued  assessment and intervention until BP is < 140/67mm HG in hypertensive participants. < 130/76mm HG in hypertensive participants with diabetes, heart failure or chronic kidney disease.;Long Term: Maintenance of blood pressure at goal levels.    Lipids Yes    Intervention Provide education and support for participant on nutrition & aerobic/resistive exercise along with prescribed medications to achieve LDL 70mg , HDL >40mg .    Expected  Outcomes Long Term: Cholesterol controlled with medications as prescribed, with individualized exercise RX and with personalized nutrition plan. Value goals: LDL < 70mg , HDL > 40 mg.;Short Term: Participant states understanding of desired cholesterol values and is compliant with medications prescribed. Participant is following exercise prescription and nutrition guidelines.    Stress Yes    Intervention Offer individual and/or small group education and counseling on adjustment to heart disease, stress management and health-related lifestyle change. Teach and support self-help strategies.;Refer participants experiencing significant psychosocial distress to appropriate mental health specialists for further evaluation and treatment. When possible, include family members and significant others in education/counseling sessions.    Expected Outcomes Short Term: Participant demonstrates changes in health-related behavior, relaxation and other stress management skills, ability to obtain effective social support, and compliance with psychotropic medications if prescribed.;Long Term: Emotional wellbeing is indicated by absence of clinically significant psychosocial distress or social isolation.          Core Components/Risk Factors/Patient Goals Review:   Goals and Risk Factor Review     Row Name 06/08/24 1645 06/30/24 1307 07/28/24 1306         Core Components/Risk Factors/Patient Goals Review   Personal Goals Review Weight  Management/Obesity;Lipids;Stress;Hypertension;Diabetes Weight Management/Obesity;Lipids;Stress;Hypertension;Diabetes Weight Management/Obesity;Lipids;Stress;Hypertension;Diabetes     Review Lynn Estrada started cardiac rehab on 06/08/24. Lynn Estrada did well with exercise for her fitness level. Vital signs and CBg's were stable. Lynn Estrada started cardiac rehab on 06/08/24. Lynn Estrada is off to a good start with exercise for her fitness level. Vital signs and CBg's have been stable. Lynn Estrada has only attended a few sessions. Lynn Estrada is doing well with exercise at cardiac rehab.  Vital signs and CBg's have been stable. Lynn Estrada has gained 4.3 kg since starting the program.     Expected Outcomes Lynn Estrada will continue to participate in cardiac rehab for exercise, nutrition and lifestyle modifications. Lynn Estrada will continue to participate in cardiac rehab for exercise, nutrition and lifestyle modifications. Lynn Estrada will continue to participate in cardiac rehab for exercise, nutrition and lifestyle modifications.        Core Components/Risk Factors/Patient Goals at Discharge (Final Review):   Goals and Risk Factor Review - 07/28/24 1306       Core Components/Risk Factors/Patient Goals Review   Personal Goals Review Weight Management/Obesity;Lipids;Stress;Hypertension;Diabetes    Review Lynn Estrada is doing well with exercise at cardiac rehab.  Vital signs and CBg's have been stable. Lynn Estrada has gained 4.3 kg since starting the program.    Expected Outcomes Lynn Estrada will continue to participate in cardiac rehab for exercise, nutrition and lifestyle modifications.          ITP Comments:  ITP Comments     Row Name 05/21/24 1106 06/01/24 1634 06/08/24 1640 06/30/24 1303 07/28/24 1257   ITP Comments Lynn Bihari, MD: Medical Director.  Introduction to the [facility] Education program/Intensive cardiac rehab. Initial orientation packet reviewed with the patient. 30 Day ITP review. Exercise is currently on hold due to recent hospitalization. Lynn Estrada has not started  exercise yet. 30 Day ITP review. Lynn Estrada started exercise at cardiac rehab on 06/08/24. Lynn Estrada did well with exercise for her fitness level. 30 Day ITP review. Lynn Estrada has good participation with exercise at cardiac rehab when in attendance 30 Day ITP review. Lynn Estrada continues to have  good participation with exercise at cardiac rehab when in attendance      Comments: See ITP Comments    [1]  Current Outpatient Medications:    ACCU-CHEK GUIDE TEST test strip, 1 strip by Other route as  needed (Pt has freestyle Comfort)., Disp: , Rfl:    acetaminophen  (TYLENOL ) 500 MG tablet, Take 500-1,000 mg by mouth every 6 (six) hours as needed (for pain.)., Disp: , Rfl:    apixaban  (ELIQUIS ) 5 MG TABS tablet, Take 1 tablet (5 mg total) by mouth 2 (two) times daily., Disp: 60 tablet, Rfl: 11   atovaquone (MEPRON) 750 MG/5ML suspension, Take 1,500 mg by mouth daily with breakfast., Disp: , Rfl:    Calcium  Carb-Cholecalciferol 600-10 MG-MCG TABS, Take 1 tablet by mouth., Disp: , Rfl:    Continuous Glucose Sensor (FREESTYLE LIBRE 2 PLUS SENSOR) MISC, 1 kit by Other route daily., Disp: , Rfl:    diltiazem (CARDIZEM CD) 120 MG 24 hr capsule, Take 240 mg by mouth., Disp: , Rfl:    Fingerstix Lancets MISC, 1 each by Other route daily., Disp: , Rfl:    furosemide  (LASIX ) 20 MG tablet, Take 40 mg by mouth daily., Disp: , Rfl:    Insulin  Aspart FlexPen (NOVOLOG ) 100 UNIT/ML, Inject 7 Units into the skin daily., Disp: , Rfl:    magnesium  oxide (MAG-OX) 400 MG tablet, Take 400 mg by mouth daily., Disp: , Rfl:    metFORMIN  (GLUCOPHAGE -XR) 500 MG 24 hr tablet, Take 1,000 mg by mouth 2 (two) times daily with a meal., Disp: , Rfl:    mycophenolate (CELLCEPT) 250 MG capsule, Take 1,500 mg by mouth., Disp: , Rfl:    pantoprazole (PROTONIX) 40 MG tablet, Take 40 mg by mouth., Disp: , Rfl:    predniSONE (DELTASONE) 5 MG tablet, Take 15 mg by mouth daily., Disp: , Rfl:    rosuvastatin (CRESTOR) 10 MG tablet, Take 10 mg by  mouth at bedtime., Disp: , Rfl:    senna-docusate (SENOKOT-S) 8.6-50 MG tablet, Take 1 tablet by mouth 2 (two) times daily., Disp: , Rfl:    tacrolimus  ER (ENVARSUS  XR) 4 MG TB24, Take 20 mg by mouth daily., Disp: , Rfl:    valGANciclovir (VALCYTE) 450 MG tablet, Take 450 mg by mouth daily., Disp: , Rfl:  [2]  Social History Tobacco Use  Smoking Status Former   Current packs/day: 0.00   Average packs/day: 0.8 packs/day for 5.0 years (3.8 ttl pk-yrs)   Types: Cigarettes   Start date: 01/11/1992   Quit date: 01/10/1997   Years since quitting: 27.5  Smokeless Tobacco Never

## 2024-07-29 ENCOUNTER — Encounter (HOSPITAL_COMMUNITY)

## 2024-07-29 ENCOUNTER — Telehealth (HOSPITAL_COMMUNITY): Payer: Self-pay | Admitting: *Deleted
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
## 2024-07-31 ENCOUNTER — Encounter (HOSPITAL_COMMUNITY)

## 2024-08-03 ENCOUNTER — Encounter (HOSPITAL_COMMUNITY)

## 2024-08-03 NOTE — Progress Notes (Signed)
 " Endocrinology Video Visit   Interval History: History of Present Illness Brendalee Viner is a 64 year old female with type 2 diabetes and s/p OHT on 02/13/2024, who presents for follow-up of her diabetes management. 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 PMH: Stage 3 CKD  She was recently discharged from the hospital following a heart transplant in August 2025. Prior to her admission, she was taking Jardiance  10 mg daily and metformin  500 mg twice daily. Post-discharge, her regimen was adjusted to include metformin  and prandial insulin .   No hx of DKA.  Current Medications: - metformin  XR 500 mg 1 tablet 2 times daily -Humalog 12/07/08-14 units, plus SSI 2/50>201  Interval Hx: 12/8//2025 -feet are tight and swollen. Denies typical s/s of neuropathy -Eye Exam: 11/09/2022. No DR OU. - feels jittery and off balance.   Interval Hx: 07/29/2024 --on prednisone 12.5 mg daily. -- not sleeping well.  -- new hair loss. -- Right foot tingling since the transplant. --She sits orange juice frequently through the night when her herlene goes off and notifies her of a low blood sugar. ---Blood sugar 54 on herlene has been checked with glucometer and found to be 128 with fingerstick -- She has blurred vision. She will contact eye doctor.  Diet:  Drinks: water  and juice or 2. Breakfast: cereal or boiled egg applesauce banana Ensure - diabetic 1 daily Lunch: Salad or sandwich - appleasuce, with protein - chicken Dinner: leftover - protein, salad, greens fruit Drink: water , OJ  Supplements: - Caltrate 600 mg +D - 400 IU daily 2 pills at 1 PM and 5 Pm  She resides in [place] and is currently staying in an apartment for her medical appointments.  Lab Results  Component Value Date   HGBA1C 8.0 (H) 04/18/2024   HGBA1C 8.0 (!) 04/18/2024    Libre 3 Plus: CGM: Average glucose 135,  GMI 6.5%, 76% TIR, 21% High, 3% LOw   Medications:   Current Outpatient Medications on File Prior to  Visit  Medication Sig Dispense Refill   atovaquone (MEPRON) 750 mg/5 mL suspension Take 10 mLs (1,500 mg total) by mouth daily with breakfast 300 mL 11   blood glucose diagnostic test strip 1 each (1 strip total) 4 (four) times daily Use as instructed. 360 each 0   calcium  carbonate-vitamin D3 (CALTRATE 600+D) 600 mg-10 mcg (400 unit) tablet Take 1 tablet by mouth 2 (two) times daily at lunch and dinner 60 tablet 11   dilTIAZem (CARDIZEM CD) 120 MG XR capsule Take 2 capsules (240 mg total) by mouth once daily 180 capsule 3   FUROsemide  (LASIX ) 20 MG tablet Take 2 tablets (40 mg total) by mouth once daily 60 tablet 11   HUMALOG U-100 INSULIN  injection (concentration 100 units/mL) FOR USE WITH CEQUR up to 40 units daily. 20 mL 11   insulin  LISPRO (HUMALOG KWIKPEN INSULIN ) pen injector (concentration 100 units/mL) Inject 6 units subcutaneously with Breakfast, inject 7 units with Lunch, and inject 9 units at [employer] - Plus correction 1/50>201. Up to 40 units daily, 15 mL 12   lancets Use 1 each 3 (three) times daily Use as instructed. 400 each 2   magnesium  oxide (MAG-OX) 400 mg (241.3 mg magnesium ) tablet Take 1 tablet (400 mg total) by mouth 2 (two) times daily at lunch and dinner 60 tablet 11   mycophenolate (CELLCEPT) 250 mg capsule Take 6 capsules (1,500 mg total) by mouth every 12 (twelve) hours 360 capsule 11   pantoprazole (PROTONIX) 40 MG  DR tablet Take 1 tablet (40 mg total) by mouth once daily 30 tablet 11   predniSONE (DELTASONE) 5 MG tablet Take 2.5 tablets (12.5 mg total) by mouth once daily 225 tablet 3   rosuvastatin (CRESTOR) 10 MG tablet Take 1 tablet (10 mg total) by mouth once daily 30 tablet 11   sennosides-docusate (SENOKOT-S) 8.6-50 mg tablet Take 1-2 tablets twice daily as needed to prevent constipation 30 tablet 2   sirolimus (RAPAMUNE) 1 MG tablet Take 1 tablet (1 mg total) by mouth once daily 30 tablet 11   tacrolimus  (ENVARSUS  XR) 4 mg extended-release tablet  Take 7 tablets (28 mg total) by mouth once daily 210 tablet 11   valGANciclovir (VALCYTE) 450 mg tablet Take 1 tablet (450 mg total) by mouth every evening 30 tablet 8   lidocaine  (LMX) 4 % cream Apply topically as needed (prior to insulin  injections. apply 4 times daily) (Patient not taking: Reported on 07/23/2024) 5 g 11   No current facility-administered medications on file prior to visit.    OBJECTIVE:  Constitutional: alert, interactive with provider, cooperative, in no distress Mental status: oriented x 3, good historian, appropriate mood and behavior, thought content appears normal Respiratory: no respiratory distress, no audible wheezing Eyes, nose, neck normal: Eyes with clear conjunctiva, no discharge, EOMI. No nasal drainage. Neck supple, normal ROM, without obvious masses.  Assessment & Plan   Assessment & Plan Type 2 diabetes mellitus post-heart transplant, A1C is 8%. Libre GMI is 6.5% -on Prednisone 12.5 mg daily. No hypoglycemia reported. - Using CEQUR with Humalog 110 units each fill every 4 days --She is having to bolus multiple times in the evenings to lower sugars leading to higher risk for overnight hyoglycemia. New Humalog schdule: -Humalog  02/06/09 plus 2/50>201. - Stop metformin  XR 500 mg 1 tablet 2 times dail - Cardiology approved Jardiance  10 mg daily - start Jardiance . --ON Jardiance  10 mg - decresae Humalog to 4/6/8 plus 2/50>201. - Continue Freestyle Libre for glucose monitoring. - Coordinate with cardiologist on prednisone dosing, adjust insulin  as needed. - Instruct to report blood sugar over 250 mg/dL or below 70 mg/dL.  WHEN YOUR STEROIDS DECREASE - PLEASE DECREASE YOUR HUMALOG BY 2 UNITS.as below  STEROID  HUMALOG BREAKFAST  HUMALOG LUNCH  HUMALOG DINNER  Prednisone 10 mg daily 2 units 4 units 6 units  Prednisone 5 mg daily 0 units 2 units 4 units  Off Prednisone 0 units - sliding scale 0 units - sliding scale 2 units plus sliding scale    Pain  at Insulin  injections site- she has Lidocaine  patch to try for decreasing injection pain. ---Use Ice 15 minutes before injections.  1. Type 2 diabetes mellitus with hyperglycemia, with long-term current use of insulin  (CMS/HHS-HCC) -     Continuous Glucose Monitoring -     Continuous Glucose Monitoring -     Albumin/Creatinine Ratio, Random Urine; Future -     empagliflozin  (JARDIANCE ) 10 mg tablet; Take 1 tablet (10 mg total) by mouth once daily  Dispense: 30 tablet; Refill: 11 -     pen needle, diabetic 31 gauge x 5/16 needle; Use as directed 4 insulin  injections daily.  Dispense: 100 each; Refill: 12 -     C-Peptide; Future  2. Type 2 diabetes mellitus with stage 3b chronic kidney disease, with long-term current use of insulin  (CMS/HHS-HCC)    Patient Instructions  Patient educated on the ability to self-schedule follow-up labs and imaging and agrees to schedule their own follow-up  labs and imaging. Patient understands to schedule as soon as possible.  Patient understands to MyChart message or call for assistance.  Thank you for choosing [facility] for your health care. Because we care about our patients, we want to give you some helpful information.   Note: If you are experiencing a medical emergency or need urgent medical care, call 911 immediately.  Endocrinology Appointment Center  [phone], option 2 Monday through Friday 7:30 a.m. - 4:30 p.m.  To schedule, reschedule, or cancel your Endocrinology appointment, please call [phone], option 2. If you are unable to come to an appointment, please notify us  as soon as possible, preferably 24 hours in advance. Doing so may allow other patients with urgent needs to be scheduled in a cancelled appointment slot.   Endocrinology Nurse Triage [phone], option 2   Monday through Friday 8:00 a.m. - 4:00 p.m. If you require medical advice or have any endocrine-related symptoms and need to speak to your providers  healthcare team, please call the Endocrinology Nurse Triage [phone], option 2.  For urgent medical concerns after hours or on holidays, call [phone] and ask to speak to the endocrinologist on call.  For non-urgent questions, please send the endocrine healthcare team a message through MyChart at [email or web address].   MyChart messages and voicemail are not checked nights, weekends and holidays. For MyChart messages please allow up to 3 business days to process and for voicemail please allow up to 24 business hours   Prescription Refill Requests Contact your pharmacy  To request prescription refills, please contact your pharmacy or send your healthcare team a Rx Renewal Request through your MyChart account at [email or web address] by using the Request RX Renewal Function.  Supply Forms and Prior Authorizations Call [phone], option 2  Please allow 7-10 business days to process forms or to complete prior authorizations.  For questions, please call [phone], option 2  then option 3 (to speak to a nurse) and option 2 (clinic 1A) location (and choose the and select the for assistance).   Test Results  Test results, which may take up to 14 days, will be communicated to you through your MyChart account at [email or web address]. If you do not have MyChart, you will receive a letter by mail which may extend this notification time.   Insulin  Pumps and Glucose Meters  If you have an insulin  pump or glucose meter, please download them prior to your visit and bring them with you to your appointment.     Future Appointments     Date/Time Provider Department Center Visit Type   08/25/2024 1:00 AM [facility] POOL [facility] CATH ADULT   08/25/2024 7:40 AM (Arrive by 7:30 AM) [id number] [facility] 74F/2G Lab [facility] LAB   08/25/2024 8:15 AM (Arrive by 7:45 AM) HEART TRANSPLANT PROVIDER [facility]   08/25/2024 10:00 AM [facility] ECHO CATH HOLDING [facility] E[facility] COMPLETE   09/01/2024 8:00 AM Lelle-Michel, Inocente, CNS [facility] DIABETES EDUCATION   09/28/2024 1:30 PM (Arrive by 1:15 PM) Ridenhour, Alan Massa, NP [facility] [place] ENDO VIDEO VISIT RETURN ADULT        This note has been created using automated tools and reviewed for accuracy by [facility] RIDENHOUR.   This video encounter was conducted with the patient's (or proxy's) verbal consent via secure, interactive audio and video telecommunications while  away from clinic/office/hospital.  The patient (or proxy) was instructed to have this encounter in a suitably private space and to only have persons present to whom they give permission to participate. In addition, patient identity was confirmed by use of name plus two identifiers.  08-21-2019 E&M) This visit was coded based on time. I spent a total of 35 minutes in both face-to-face and non-face-to-face activities for this visit on the date of this encounter.    Attestation Statement:   I personally performed the service, non-incident to. (WP)   AMANDA ANETTE CHILL, NP       "

## 2024-08-05 ENCOUNTER — Encounter (HOSPITAL_COMMUNITY)

## 2024-08-06 ENCOUNTER — Other Ambulatory Visit: Payer: Self-pay | Admitting: Adult Health

## 2024-08-06 NOTE — Telephone Encounter (Signed)
 Left a message with patient to return my call, seeking verification about refill, is PCP filling medication?

## 2024-08-07 ENCOUNTER — Encounter (HOSPITAL_COMMUNITY)

## 2024-08-07 ENCOUNTER — Other Ambulatory Visit (HOSPITAL_COMMUNITY): Admission: RE | Admit: 2024-08-07 | Source: Ambulatory Visit

## 2024-08-07 LAB — BASIC METABOLIC PANEL WITH GFR
Anion gap: 14 (ref 5–15)
BUN: 32 mg/dL — ABNORMAL HIGH (ref 8–23)
CO2: 26 mmol/L (ref 22–32)
Calcium: 9.6 mg/dL (ref 8.9–10.3)
Chloride: 103 mmol/L (ref 98–111)
Creatinine, Ser: 1.61 mg/dL — ABNORMAL HIGH (ref 0.44–1.00)
GFR, Estimated: 36 mL/min — ABNORMAL LOW
Glucose, Bld: 106 mg/dL — ABNORMAL HIGH (ref 70–99)
Potassium: 3.9 mmol/L (ref 3.5–5.1)
Sodium: 142 mmol/L (ref 135–145)

## 2024-08-10 ENCOUNTER — Encounter (HOSPITAL_COMMUNITY)

## 2024-08-12 ENCOUNTER — Encounter (HOSPITAL_COMMUNITY)

## 2024-08-14 ENCOUNTER — Encounter (HOSPITAL_COMMUNITY)
